# Patient Record
Sex: Female | Born: 1950 | Race: White | Hispanic: No | Marital: Single | Smoking: Former smoker
Health system: Southern US, Community
[De-identification: ages and names within clinical notes are randomized; demographics above are authoritative.]

## PROBLEM LIST (undated history)

## (undated) DIAGNOSIS — Z95810 Presence of automatic (implantable) cardiac defibrillator: Secondary | ICD-10-CM

## (undated) DIAGNOSIS — Z950 Presence of cardiac pacemaker: Secondary | ICD-10-CM

## (undated) DIAGNOSIS — I10 Essential (primary) hypertension: Secondary | ICD-10-CM

## (undated) DIAGNOSIS — R0600 Dyspnea, unspecified: Secondary | ICD-10-CM

## (undated) DIAGNOSIS — J449 Chronic obstructive pulmonary disease, unspecified: Secondary | ICD-10-CM

## (undated) DIAGNOSIS — I2510 Atherosclerotic heart disease of native coronary artery without angina pectoris: Secondary | ICD-10-CM

## (undated) DIAGNOSIS — I509 Heart failure, unspecified: Secondary | ICD-10-CM

## (undated) DIAGNOSIS — C189 Malignant neoplasm of colon, unspecified: Secondary | ICD-10-CM

## (undated) DIAGNOSIS — F329 Major depressive disorder, single episode, unspecified: Secondary | ICD-10-CM

## (undated) DIAGNOSIS — I739 Peripheral vascular disease, unspecified: Secondary | ICD-10-CM

## (undated) DIAGNOSIS — F32A Depression, unspecified: Secondary | ICD-10-CM

## (undated) DIAGNOSIS — K219 Gastro-esophageal reflux disease without esophagitis: Secondary | ICD-10-CM

## (undated) DIAGNOSIS — Z860100 Personal history of colon polyps, unspecified: Secondary | ICD-10-CM

## (undated) DIAGNOSIS — Z720 Tobacco use: Secondary | ICD-10-CM

## (undated) DIAGNOSIS — I059 Rheumatic mitral valve disease, unspecified: Secondary | ICD-10-CM

## (undated) DIAGNOSIS — D649 Anemia, unspecified: Secondary | ICD-10-CM

## (undated) DIAGNOSIS — N189 Chronic kidney disease, unspecified: Secondary | ICD-10-CM

## (undated) DIAGNOSIS — I219 Acute myocardial infarction, unspecified: Secondary | ICD-10-CM

## (undated) DIAGNOSIS — E785 Hyperlipidemia, unspecified: Secondary | ICD-10-CM

## (undated) HISTORY — PX: COLON SURGERY: SHX602

## (undated) HISTORY — DX: Rheumatic mitral valve disease, unspecified: I05.9

## (undated) HISTORY — PX: OTHER SURGICAL HISTORY: SHX169

## (undated) HISTORY — DX: Personal history of colon polyps, unspecified: Z86.0100

## (undated) HISTORY — DX: Depression, unspecified: F32.A

## (undated) HISTORY — DX: Malignant neoplasm of colon, unspecified: C18.9

## (undated) HISTORY — DX: Gastro-esophageal reflux disease without esophagitis: K21.9

## (undated) HISTORY — DX: Personal history of colonic polyps: Z86.010

## (undated) HISTORY — PX: CARDIAC CATHETERIZATION: SHX172

## (undated) HISTORY — PX: CORONARY ARTERY BYPASS GRAFT: SHX141

## (undated) HISTORY — DX: Essential (primary) hypertension: I10

## (undated) HISTORY — DX: Major depressive disorder, single episode, unspecified: F32.9

## (undated) HISTORY — DX: Hyperlipidemia, unspecified: E78.5

## (undated) HISTORY — DX: Atherosclerotic heart disease of native coronary artery without angina pectoris: I25.10

---
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
## 1997-11-06 HISTORY — PX: TOTAL ABDOMINAL HYSTERECTOMY: SHX209

## 2005-11-13 ENCOUNTER — Other Ambulatory Visit: Payer: Self-pay

## 2005-11-14 ENCOUNTER — Inpatient Hospital Stay: Payer: Self-pay

## 2006-01-02 ENCOUNTER — Encounter: Payer: Self-pay | Admitting: Nurse Practitioner

## 2006-01-04 ENCOUNTER — Encounter: Payer: Self-pay | Admitting: Nurse Practitioner

## 2007-04-25 ENCOUNTER — Ambulatory Visit: Payer: Self-pay | Admitting: Family Medicine

## 2008-07-08 ENCOUNTER — Ambulatory Visit: Payer: Self-pay | Admitting: Internal Medicine

## 2008-07-30 ENCOUNTER — Ambulatory Visit: Payer: Self-pay | Admitting: Internal Medicine

## 2009-03-10 ENCOUNTER — Ambulatory Visit: Payer: Self-pay | Admitting: Internal Medicine

## 2009-05-05 ENCOUNTER — Ambulatory Visit: Payer: Self-pay | Admitting: Unknown Physician Specialty

## 2009-05-07 ENCOUNTER — Ambulatory Visit: Payer: Self-pay | Admitting: Unknown Physician Specialty

## 2009-06-03 ENCOUNTER — Inpatient Hospital Stay: Payer: Self-pay | Admitting: Surgery

## 2009-06-29 ENCOUNTER — Ambulatory Visit: Payer: Self-pay | Admitting: Oncology

## 2009-07-07 ENCOUNTER — Ambulatory Visit: Payer: Self-pay | Admitting: Oncology

## 2009-11-06 ENCOUNTER — Emergency Department: Payer: Self-pay | Admitting: Internal Medicine

## 2010-05-12 ENCOUNTER — Inpatient Hospital Stay: Payer: Self-pay | Admitting: Internal Medicine

## 2010-09-04 ENCOUNTER — Inpatient Hospital Stay: Payer: Self-pay | Admitting: Specialist

## 2013-02-23 LAB — BASIC METABOLIC PANEL
Calcium, Total: 9.9 mg/dL (ref 8.5–10.1)
Chloride: 108 mmol/L — ABNORMAL HIGH (ref 98–107)
Co2: 21 mmol/L (ref 21–32)
EGFR (African American): >60
Sodium: 141 mmol/L (ref 136–145)

## 2013-02-23 LAB — TROPONIN I: Troponin-I: <0.02 ng/mL

## 2013-02-23 LAB — CBC
HCT: 44.3 % (ref 35.0–47.0)
HGB: 15.5 g/dL (ref 12.0–16.0)
MCH: 31.5 pg (ref 26.0–34.0)
MCHC: 35 g/dL (ref 32.0–36.0)
MCV: 90 fL (ref 80–100)
Platelet: 291 10*3/uL (ref 150–440)
RDW: 13.3 % (ref 11.5–14.5)

## 2013-02-24 ENCOUNTER — Observation Stay: Payer: Self-pay | Admitting: Internal Medicine

## 2013-02-24 LAB — DRUG SCREEN, URINE
Benzodiazepine, Ur Scrn: NEGATIVE (ref ?–200)
Cannabinoid 50 Ng, Ur ~~LOC~~: NEGATIVE (ref ?–50)
Cocaine Metabolite,Ur ~~LOC~~: NEGATIVE (ref ?–300)
MDMA (Ecstasy)Ur Screen: NEGATIVE (ref ?–500)
Methadone, Ur Screen: NEGATIVE (ref ?–300)
Opiate, Ur Screen: NEGATIVE (ref ?–300)

## 2013-02-24 LAB — CK TOTAL AND CKMB (NOT AT ARMC)
CK, Total: 145 U/L (ref 21–215)
CK-MB: 3.1 ng/mL (ref 0.5–3.6)
CK-MB: 3 ng/mL (ref 0.5–3.6)

## 2013-02-24 LAB — ETHANOL: Ethanol %: 0.097 % — ABNORMAL HIGH (ref 0.000–0.080)

## 2013-02-24 LAB — TROPONIN I: Troponin-I: <0.02 ng/mL

## 2013-02-24 LAB — TSH: Thyroid Stimulating Horm: 2.21 u[IU]/mL

## 2013-02-24 LAB — PHOSPHORUS: Phosphorus: 3.7 mg/dL (ref 2.5–4.9)

## 2013-02-25 LAB — BASIC METABOLIC PANEL
Anion Gap: 7 (ref 7–16)
BUN: 16 mg/dL (ref 7–18)
Chloride: 106 mmol/L (ref 98–107)
Co2: 26 mmol/L (ref 21–32)
Creatinine: 1 mg/dL (ref 0.60–1.30)
Glucose: 87 mg/dL (ref 65–99)
Osmolality: 278 (ref 275–301)
Sodium: 139 mmol/L (ref 136–145)

## 2013-02-25 LAB — MAGNESIUM: Magnesium: 2 mg/dL

## 2013-02-25 LAB — LIPID PANEL
HDL Cholesterol: 33 mg/dL — ABNORMAL LOW (ref 40–60)
Ldl Cholesterol, Calc: 111 mg/dL — ABNORMAL HIGH (ref 0–100)
Triglycerides: 321 mg/dL — ABNORMAL HIGH (ref 0–200)

## 2013-02-25 LAB — PHOSPHORUS: Phosphorus: 3.8 mg/dL (ref 2.5–4.9)

## 2013-05-29 ENCOUNTER — Observation Stay: Payer: Self-pay | Admitting: Cardiology

## 2013-05-29 ENCOUNTER — Ambulatory Visit (INDEPENDENT_AMBULATORY_CARE_PROVIDER_SITE_OTHER): Payer: Medicaid Other | Admitting: Internal Medicine

## 2013-05-29 ENCOUNTER — Encounter: Payer: Self-pay | Admitting: Internal Medicine

## 2013-05-29 VITALS — BP 142/100 | HR 86 | Temp 98.2°F | Ht 64.5 in | Wt 165.0 lb

## 2013-05-29 DIAGNOSIS — C189 Malignant neoplasm of colon, unspecified: Secondary | ICD-10-CM

## 2013-05-29 DIAGNOSIS — K219 Gastro-esophageal reflux disease without esophagitis: Secondary | ICD-10-CM

## 2013-05-29 DIAGNOSIS — E7800 Pure hypercholesterolemia, unspecified: Secondary | ICD-10-CM

## 2013-05-29 DIAGNOSIS — F329 Major depressive disorder, single episode, unspecified: Secondary | ICD-10-CM

## 2013-05-29 DIAGNOSIS — I2510 Atherosclerotic heart disease of native coronary artery without angina pectoris: Secondary | ICD-10-CM

## 2013-05-29 DIAGNOSIS — R079 Chest pain, unspecified: Secondary | ICD-10-CM

## 2013-05-29 DIAGNOSIS — I10 Essential (primary) hypertension: Secondary | ICD-10-CM

## 2013-05-29 LAB — BASIC METABOLIC PANEL
Anion Gap: 7 (ref 7–16)
Calcium, Total: 9.6 mg/dL (ref 8.5–10.1)
Chloride: 104 mmol/L (ref 98–107)
Co2: 27 mmol/L (ref 21–32)
Creatinine: 0.87 mg/dL (ref 0.60–1.30)
Glucose: 96 mg/dL (ref 65–99)
Potassium: 3.7 mmol/L (ref 3.5–5.1)

## 2013-05-29 LAB — CK TOTAL AND CKMB (NOT AT ARMC): CK-MB: 2.2 ng/mL (ref 0.5–3.6)

## 2013-05-29 LAB — TROPONIN I: Troponin-I: <0.02 ng/mL

## 2013-05-29 LAB — PROTIME-INR: INR: 1

## 2013-05-29 LAB — CBC
HCT: 43.3 % (ref 35.0–47.0)
MCHC: 35 g/dL (ref 32.0–36.0)
Platelet: 250 10*3/uL (ref 150–440)
RBC: 4.78 10*6/uL (ref 3.80–5.20)
WBC: 10 10*3/uL (ref 3.6–11.0)

## 2013-05-30 LAB — CBC WITH DIFFERENTIAL/PLATELET
Basophil %: 0.5 %
Eosinophil %: 2.4 %
HCT: 41 % (ref 35.0–47.0)
HGB: 14.3 g/dL (ref 12.0–16.0)
Lymphocyte %: 28.9 %
MCH: 31.7 pg (ref 26.0–34.0)
MCHC: 35 g/dL (ref 32.0–36.0)
MCV: 91 fL (ref 80–100)
Neutrophil #: 4.9 10*3/uL (ref 1.4–6.5)
Neutrophil %: 58.2 %
RBC: 4.53 10*6/uL (ref 3.80–5.20)
RDW: 13.9 % (ref 11.5–14.5)
WBC: 8.5 10*3/uL (ref 3.6–11.0)

## 2013-05-30 LAB — CK TOTAL AND CKMB (NOT AT ARMC)
CK, Total: 50 U/L (ref 21–215)
CK-MB: 1.4 ng/mL (ref 0.5–3.6)

## 2013-05-30 LAB — BASIC METABOLIC PANEL
Anion Gap: 5 — ABNORMAL LOW (ref 7–16)
Calcium, Total: 8.9 mg/dL (ref 8.5–10.1)
Chloride: 106 mmol/L (ref 98–107)
Creatinine: 1.06 mg/dL (ref 0.60–1.30)
EGFR (African American): >60
Glucose: 105 mg/dL — ABNORMAL HIGH (ref 65–99)
Osmolality: 282 (ref 275–301)
Potassium: 3.4 mmol/L — ABNORMAL LOW (ref 3.5–5.1)

## 2013-05-30 LAB — TROPONIN I
Troponin-I: <0.02 ng/mL
Troponin-I: <0.02 ng/mL

## 2013-05-30 LAB — LIPID PANEL: Ldl Cholesterol, Calc: 107 mg/dL — ABNORMAL HIGH (ref 0–100)

## 2013-06-01 ENCOUNTER — Encounter: Payer: Self-pay | Admitting: Internal Medicine

## 2013-06-01 DIAGNOSIS — F325 Major depressive disorder, single episode, in full remission: Secondary | ICD-10-CM | POA: Insufficient documentation

## 2013-06-01 DIAGNOSIS — I10 Essential (primary) hypertension: Secondary | ICD-10-CM | POA: Insufficient documentation

## 2013-06-01 DIAGNOSIS — D126 Benign neoplasm of colon, unspecified: Secondary | ICD-10-CM | POA: Insufficient documentation

## 2013-06-01 DIAGNOSIS — K219 Gastro-esophageal reflux disease without esophagitis: Secondary | ICD-10-CM | POA: Insufficient documentation

## 2013-06-01 DIAGNOSIS — E7800 Pure hypercholesterolemia, unspecified: Secondary | ICD-10-CM | POA: Insufficient documentation

## 2013-06-01 DIAGNOSIS — I2510 Atherosclerotic heart disease of native coronary artery without angina pectoris: Secondary | ICD-10-CM | POA: Insufficient documentation

## 2013-06-01 NOTE — Assessment & Plan Note (Signed)
Blood pressure significant elevated.  Off her medication.  Will need to restart.  Referred to ER as outlined.  Will follow up after discharge.  Blood pressure after NTG - 204/118.

## 2013-06-01 NOTE — Assessment & Plan Note (Signed)
Low cholesterol diet.  Will need to restart her statin.

## 2013-06-01 NOTE — Assessment & Plan Note (Signed)
Has known reflux.  Off omeprazole.  Will need to restart omeprazole.  If persistent symptoms, will require GI evaluation.

## 2013-06-01 NOTE — Assessment & Plan Note (Signed)
Off her lexapro.  Will need to restart.  Follow.

## 2013-06-01 NOTE — Assessment & Plan Note (Signed)
Is s/p CABG and mitral valve repair.  Now with intermittent chest pain.  Off all medication.  Blood pressure significantly elevated.  Was given a NTG here in the office and her pain went from a 6 down to 1-2 prior to EMS arrival.  O2 placed.  EKG obtained and revealed SR with ST depression in I and aVL and v5 and v6.  EMTs called and pt was transported over to the ER for further evaluation and treatment.   ER notified.

## 2013-06-01 NOTE — Progress Notes (Signed)
Subjective:    Patient ID: Ashley [name], female    DOB: December 10, 1950, 62 y.o.   MRN: [id number]  HPI 62 year old female with past history of CAD s/p CABG and mitral valve repair, colon cancer s/p colon surgery, hypertension and hypercholesterolemia who comes in today to follow up on these issues as well as to establish care.  States she was hospitalized at [facility] with high blood pressure and sob.  Told she had [place].  Saw Dr Saralyn Pilar after discharge.  Was supposed to have had a stress test.  She did not follow through with having the test.  She has intermittent chest pain.  Takes NTG prn.  Has been off all her medications for the last few weeks.  Does not have NTG now to take.  Started having chest pain last pm.  Still present today.  She also has a history of GERD.  Off omeprazole now for a few weeks.  Having increased issues with reflux.  Has different chest pains.  Some of her pain is reproducible on exam.  She also has some discomfort she attributes the the reflux, and then has the angina pain.  She also describes her left arm aching.  She does smoke.  No desire to quit.  No nausea or vomiting.  Bowels stable for her.  Has had diarrhea since her colon surgery.  Due a f/u colonoscopy.  Sees Dr Tiffany Kocher.     Past Medical History  Diagnosis Date  . GERD (gastroesophageal reflux disease)   . Hypertension   . Hyperlipidemia   . Depression   . Hx of colonic polyps   . Colon cancer   . CAD (coronary artery disease)     s/p CABG  . Mitral valve disorder     s/p mitral valve repair wth CABG    Outpatient Encounter Prescriptions as of 05/29/2013  Medication Sig Dispense Refill  . Aclidinium Bromide (TUDORZA PRESSAIR) 400 MCG/ACT AEPB Inhale into the lungs.      [name] albuterol (PROVENTIL HFA;VENTOLIN HFA) 108 (90 BASE) MCG/ACT inhaler Inhale 2 puffs into the lungs every 6 (six) hours as needed for wheezing.      [name] aspirin 81 MG tablet Take 81 mg by mouth daily.      . carvedilol (COREG) 6.25 MG tablet  Take 6.25 mg by mouth 2 (two) times daily with a meal.      . escitalopram (LEXAPRO) 10 MG tablet Take 10 mg by mouth daily.      . Fluticasone-Salmeterol (ADVAIR DISKUS) 250-50 MCG/DOSE AEPB Inhale 1 puff into the lungs every 12 (twelve) hours.      [name] lisinopril (PRINIVIL,ZESTRIL) 20 MG tablet Take 20 mg by mouth daily.      . metroNIDAZOLE (METROCREAM) 0.75 % cream Apply 1 application topically 2 (two) times daily as needed.      . nitroGLYCERIN (NITROSTAT) 0.4 MG SL tablet Place 0.4 mg under the tongue every 5 (five) minutes as needed for chest pain.      [name] omeprazole (PRILOSEC) 20 MG capsule Take 20 mg by mouth 2 (two) times daily.      . simvastatin (ZOCOR) 20 MG tablet Take 20 mg by mouth every evening.       No facility-administered encounter medications on file as of 05/29/2013.    Review of Systems Patient denies any headache, lightheadedness or dizziness.  No significant sinus or allergy symptoms.   Reports the chest pain as outlined.  Left arm aching.  Increased acid reflux.  No nausea or vomiting.  No abdominal pain or cramping.  No bowel change, such as constipation, BRBPR or melana.  Does report diarrhea since her surgery.  No urine change.  Has right hip and buttock pain - with walking.  Some bilateral toe numbness.  Out of her medication as outlined.        Objective:   Physical Exam Filed Vitals:   05/29/13 1334  BP: 142/100  Pulse: 86  Temp: 98.2 F (36.8 C)   Blood pressure recheck:  220/122, pulse 67  62 year old female in no acute distress.   HEENT:  Nares- clear.  Oropharynx - without lesions. NECK:  Supple.  Nontender.  No audible bruit.  HEART:  Appears to be regular. LUNGS:  No crackles or wheezing audible.  Respirations even and unlabored.  RADIAL PULSE:  Equal bilaterally.    CHEST:  Some reproducible pain with palpation over her anterior chest.  Not the same pain as she is describing as her angina pain.  ABDOMEN:  Soft, nontender.  Bowel sounds present  and normal.  No audible abdominal bruit.   EXTREMITIES:  No increased edema present.  DP pulses palpable and equal bilaterally.           Assessment & Plan:  MSK.  Various joint and msk complaints.  Will pursue further cardiac w/up first and then follow up regarding the msk pain.    HEALTH MAINTENANCE.  Will need to get her in for a complete physical exam.  Discuss further health maintenance issues with her at that time.    I spent one hour with the patient and more than 50% of the time was spent in consultation regarding the above.

## 2013-06-01 NOTE — Assessment & Plan Note (Signed)
Last colonoscopy 2009.  Overdue.  Will need to get her current cardiac issues sorted through and then pursue further GI evaluation.  Has seen Dr Tiffany Kocher.

## 2013-06-04 ENCOUNTER — Telehealth: Payer: Self-pay | Admitting: Internal Medicine

## 2013-06-10 ENCOUNTER — Telehealth: Payer: Self-pay | Admitting: *Deleted

## 2013-06-10 NOTE — Telephone Encounter (Signed)
Patient has invalid number tried to call and schedule hospital follow up. Was able to reach patient through [facility]. Hospital follow up scheduled.

## 2013-06-18 ENCOUNTER — Telehealth: Payer: Self-pay | Admitting: *Deleted

## 2013-06-18 ENCOUNTER — Ambulatory Visit (INDEPENDENT_AMBULATORY_CARE_PROVIDER_SITE_OTHER): Payer: Medicare Other | Admitting: Internal Medicine

## 2013-06-18 ENCOUNTER — Ambulatory Visit: Payer: Self-pay | Admitting: Internal Medicine

## 2013-06-18 ENCOUNTER — Encounter: Payer: Self-pay | Admitting: Internal Medicine

## 2013-06-18 VITALS — BP 172/92 | HR 60 | Temp 97.7°F | Resp 12 | Wt 166.8 lb

## 2013-06-18 DIAGNOSIS — J441 Chronic obstructive pulmonary disease with (acute) exacerbation: Secondary | ICD-10-CM | POA: Insufficient documentation

## 2013-06-18 DIAGNOSIS — R0989 Other specified symptoms and signs involving the circulatory and respiratory systems: Secondary | ICD-10-CM | POA: Diagnosis not present

## 2013-06-18 DIAGNOSIS — Z85038 Personal history of other malignant neoplasm of large intestine: Secondary | ICD-10-CM

## 2013-06-18 DIAGNOSIS — J4489 Other specified chronic obstructive pulmonary disease: Secondary | ICD-10-CM

## 2013-06-18 DIAGNOSIS — E7800 Pure hypercholesterolemia, unspecified: Secondary | ICD-10-CM

## 2013-06-18 DIAGNOSIS — Z0000 Encounter for general adult medical examination without abnormal findings: Secondary | ICD-10-CM

## 2013-06-18 DIAGNOSIS — I70219 Atherosclerosis of native arteries of extremities with intermittent claudication, unspecified extremity: Secondary | ICD-10-CM | POA: Diagnosis not present

## 2013-06-18 DIAGNOSIS — J449 Chronic obstructive pulmonary disease, unspecified: Secondary | ICD-10-CM

## 2013-06-18 DIAGNOSIS — C189 Malignant neoplasm of colon, unspecified: Secondary | ICD-10-CM

## 2013-06-18 DIAGNOSIS — Z1239 Encounter for other screening for malignant neoplasm of breast: Secondary | ICD-10-CM | POA: Diagnosis not present

## 2013-06-18 DIAGNOSIS — Z01818 Encounter for other preprocedural examination: Secondary | ICD-10-CM | POA: Insufficient documentation

## 2013-06-18 DIAGNOSIS — R0789 Other chest pain: Secondary | ICD-10-CM

## 2013-06-18 DIAGNOSIS — I2510 Atherosclerotic heart disease of native coronary artery without angina pectoris: Secondary | ICD-10-CM

## 2013-06-18 DIAGNOSIS — R079 Chest pain, unspecified: Secondary | ICD-10-CM

## 2013-06-18 DIAGNOSIS — I10 Essential (primary) hypertension: Secondary | ICD-10-CM

## 2013-06-18 DIAGNOSIS — R071 Chest pain on breathing: Secondary | ICD-10-CM

## 2013-06-18 DIAGNOSIS — R0781 Pleurodynia: Secondary | ICD-10-CM

## 2013-06-18 MED ORDER — OXYCODONE-ACETAMINOPHEN 5-325 MG PO TABS: 1.0000 | ORAL_TABLET | Freq: Three times a day (TID) | ORAL | Status: DC | PRN

## 2013-06-18 NOTE — Assessment & Plan Note (Signed)
Secondary to years of tobacco abuse. Tobacco cessation advised given her history of coronary artery disease hyperlipidemia hypertension and probable peripheral vascular disease. To change her meclizine bromide to Spiriva as she is not tolerating this inhaler. Continue Advair.

## 2013-06-18 NOTE — Progress Notes (Signed)
Patient ID: Ashley [name], female   DOB: 12/21/1950, 62 y.o.   MRN: [id number]  Patient Active Problem List   Diagnosis Date Noted  . COPD (chronic obstructive pulmonary disease) 06/18/2013  . Atherosclerosis of native arteries of the extremities with intermittent claudication 06/18/2013  . Routine general medical examination at a health care facility 06/18/2013  . CAD (coronary artery disease) 06/01/2013  . GERD (gastroesophageal reflux disease) 06/01/2013  . Essential hypertension, benign 06/01/2013  . Hypercholesterolemia 06/01/2013  . Colon cancer 06/01/2013  . Depression 06/01/2013    Subjective:  CC:   Chief Complaint  Patient presents with  . Follow-up    hospital follow up    HPI:   Ashley [name] is a 62 y.o. female who presents as a new patient to establish primary care with the chief complaint of  Buttock pain. Last seen by me over 3 years ago      Hospitalized 7/24 for [place], known CAD s/p CABG,  Had run out of meds.  Negative stress test , negative troponins. Discharged home on statin asa, coreg and lisinopril  Had a fall while dizzy brushing hair  Bent over ,  Stood up got dizzy and fell against the tub.,  Left sided ribs still hurting .  Hurst to take a deep breath, sneeze or cough.  Still smoking 1/2 pack daily   Lost her job at [employer] after having colon Ca due to recurrent missed work for diarrhea (hemicolectomy) waiting for disability.  Moved to [place] for 2 yrs,  Returned to [facility] 2013.[name]    Uncontrolled HTN: secondary to noncompliance due to financial pressures   Stress and urge Urinary incontinence.  1999 TAH/BSO secondary to severe cramps and irregular PAP smears  For 1.5 yrs.no trial of anticholinergics  COPD:  Still smoking  Doesn't like ne of her inhalers bc it is difficult to manage   SH:  TAH/BSO,  Hemicolectomy,  Left arm plate and screws  Following fracture.,orf radius and ulna   Last mammogram:  Needs no prior abnormals Last colonoscopy  was after her hemicolectomy 2009 at [facility])  Needs appt.    DEXA scan done remotely 2007  (she thinks)     Past Medical History  Diagnosis Date  . GERD (gastroesophageal reflux disease)   . Hypertension   . Hyperlipidemia   . Depression   . Hx of colonic polyps   . Colon cancer   . CAD (coronary artery disease)     s/p CABG  . Mitral valve disorder     s/p mitral valve repair wth CABG    Past Surgical History  Procedure Laterality Date  . Total abdominal hysterectomy  1999    history of abnormal pap  . Cabg with mitral valve repair    . Colon surgery      colon cancer  . Arm surgery      fracture, has plates and screws    Family History  Problem Relation Age of Onset  . Arthritis Mother   . Cancer Mother     uterus cancer  . Hyperlipidemia Mother   . Hypertension Mother   . Heart disease Mother   . Diabetes Mother   . Hyperlipidemia Father   . Hypertension Father   . Heart disease Father   . Diabetes Father   . Cancer Sister     ovary cancer  . Diabetes Maternal Grandmother   . Hypertension Maternal Grandmother   . Arthritis Maternal Grandmother   .  Hypertension Maternal Grandfather   . Hypertension Paternal Grandmother   . Hypertension Paternal Grandfather   . Heart disease Paternal Grandfather     History   Social History  . Marital Status: Widowed    Spouse Name: N/A    Number of Children: 0  . Years of Education: N/A   Occupational History  . Not on file.   Social History Main Topics  . Smoking status: Current Every Day Smoker    Types: Cigarettes  . Smokeless tobacco: Never Used     Comment: 1/2-1 ppd  . Alcohol Use: No  . Drug Use: No  . Sexual Activity: Not on file   Other Topics Concern  . Not on file   Social History Narrative  . No narrative on file    No Known Allergies   Review of Systems:   The remainder of the review of systems was negative except those addressed in the HPI.    Objective:  BP 172/92   Pulse 60  Temp(Src) 97.7 F (36.5 C) (Oral)  Resp 12  Wt 166 lb 12 oz (75.637 kg)  BMI 28.19 kg/m2  SpO2 99%  General appearance: alert, cooperative and appears older than stated age.  Several teeth missing.  Ears: normal TM's and external ear canals both ears Throat: lips, mucosa, and tongue normal; teeth and gums normal Neck: no adenopathy, no carotid bruit, supple, symmetrical, trachea midline and thyroid not enlarged, symmetric, no tenderness/mass/nodules Back: symmetric, no curvature. ROM normal. No CVA tenderness. Lungs: clear to auscultation bilaterally Heart: regular rate and rhythm, S1, S2 normal, no murmur, click, rub or gallop Abdomen: soft, non-tender; bowel sounds normal; no masses,  no organomegaly Pulses: nonpalpable right DP,  Left 1+ cap refill sluggish Skin: Skin color, texture, turgor normal. No rashes or lesions Lymph nodes: Cervical, supraclavicular, and axillary nodes normal.  Assessment and Plan:  COPD (chronic obstructive pulmonary disease) Secondary to years of tobacco abuse. Tobacco cessation advised given her history of coronary artery disease hyperlipidemia hypertension and probable peripheral vascular disease. To change her meclizine bromide to Spiriva as she is not tolerating this inhaler. Continue Advair.  Essential hypertension, benign Uncontrolled on current regimen. Increasing lisinopril to 20 mg twice daily. She will recheck her blood pressure in one week and if still elevated above 150 we will start losartan instead of lisinopril. Continue carvedilol  Colon cancer Details of colon cancer are not clear as I do not have old records. I do note that she has been lost to followup and has not had a colonoscopy since her hemicolectomy 4 years ago. Referral to Dr. Vira Agar for followup.  Hypercholesterolemia She is tolerating simvastatin with no muscle aches. She has been on the medication since discharge one week ago. She will return in early September for  repeat lipids.  CAD (coronary artery disease) Her coronary artery disease is managed by Dr. Neldon Newport shows. He is planning on doing cardiac catheterization on her in the near future.  Atherosclerosis of native arteries of the extremities with intermittent claudication She has an absent pulse in the right foot and upon questioning has recurrent buttock and thigh pain with walking but it causes her to stop. Range of motion exercises of the hip were normal. In concerned she has a discussed critical stenosis here that needs to be found prior to her cardiac catheterization. Referral to [facility]. for evaluation.  Routine general medical examination at a health care facility Breast and pelvic exams were not done today but she was  brought up to date  with all overdue screenings. Mammogram has been ordered.  Left-sided chest wall pain And she is exquisitely tender on the left s .ide after having blunt trauma to left sided rib cage he a week ago. Plain films have been ordered to rule out fractures. She is a high-risk for a pneumonia given her history of COPD, ongoing tobacco abuse, and pain with inspiration and cough. I've given her prescription for Percocet advised to use this along with ibuprofen for it and anti-inflammatory   Updated Medication List Outpatient Encounter Prescriptions as of 06/18/2013  Medication Sig Dispense Refill  . Aclidinium Bromide (TUDORZA PRESSAIR) 400 MCG/ACT AEPB Inhale into the lungs.      [name] albuterol (PROVENTIL HFA;VENTOLIN HFA) 108 (90 BASE) MCG/ACT inhaler Inhale 2 puffs into the lungs every 6 (six) hours as needed for wheezing.      [name] aspirin 81 MG tablet Take 81 mg by mouth daily.      . carvedilol (COREG) 6.25 MG tablet Take 6.25 mg by mouth 2 (two) times daily with a meal.      . escitalopram (LEXAPRO) 10 MG tablet Take 10 mg by mouth daily.      . Fluticasone-Salmeterol (ADVAIR DISKUS) 250-50 MCG/DOSE AEPB Inhale 1 puff into the lungs every 12 (twelve) hours.      [name]  lisinopril (PRINIVIL,ZESTRIL) 20 MG tablet Take 20 mg by mouth daily.      . metroNIDAZOLE (METROCREAM) 0.75 % cream Apply 1 application topically 2 (two) times daily as needed.      . nitroGLYCERIN (NITROSTAT) 0.4 MG SL tablet Place 0.4 mg under the tongue every 5 (five) minutes as needed for chest pain.      [name] omeprazole (PRILOSEC) 20 MG capsule Take 20 mg by mouth 2 (two) times daily.      . simvastatin (ZOCOR) 20 MG tablet Take 20 mg by mouth every evening.      [name] oxyCODONE-acetaminophen (ROXICET) 5-325 MG per tablet Take 1 tablet by mouth every 8 (eight) hours as needed for pain.  30 tablet  0   No facility-administered encounter medications on file as of 06/18/2013.

## 2013-06-18 NOTE — Patient Instructions (Addendum)
1) Your blood pressure is not at goal yet. Increase to  Lisinopril to twice daily   20 mg Check bp once daily around noon.  If bp is still > 150/90,   Call us and we will substitute losartan once daily for the lisinopril Continue coreg at current dose.   2) your cholesterol needs follow up We need to repeat fasting lipids after sept 7 th   (nothing but water for 6 to 8 hours prior to blood draw)   3) I am giving you Percocet for rib pain (short supply),  Ok to add aleve or motrin  To it    Rib films to rule out fracture. [facility]  4) COPD:  Pleas decrease your cigarette use by 1 cigarette  Per day every week We Can substitute once daily Spiriva for the Tudorza  5) Buttock pain: this may be a sign of a blockage in your artery   Referral to [facility] for evaluation of your circulation  Mammogran and colonoscopy ordered too

## 2013-06-18 NOTE — Assessment & Plan Note (Signed)
Uncontrolled on current regimen. Increasing lisinopril to 20 mg twice daily. She will recheck her blood pressure in one week and if still elevated above 150 we will start losartan instead of lisinopril. Continue carvedilol

## 2013-06-18 NOTE — Assessment & Plan Note (Signed)
Breast and pelvic exams were not done today but she was brought up to date  with all overdue screenings. Mammogram has been ordered.

## 2013-06-18 NOTE — Assessment & Plan Note (Signed)
She has an absent pulse in the right foot and upon questioning has recurrent buttock and thigh pain with walking but it causes her to stop. Range of motion exercises of the hip were normal. In concerned she has a discussed critical stenosis here that needs to be found prior to her cardiac catheterization. Referral to [facility]. for evaluation.

## 2013-06-18 NOTE — Telephone Encounter (Signed)
Called and talked with patient concerning fractured Ribs as verbally ordered and instructed patient on Deep breathing patient voiced understanding. FYI

## 2013-06-18 NOTE — Assessment & Plan Note (Signed)
Details of colon cancer are not clear as I do not have old records. I do note that she has been lost to followup and has not had a colonoscopy since her hemicolectomy 4 years ago. Referral to Dr. Vira Agar for followup.

## 2013-06-18 NOTE — Assessment & Plan Note (Signed)
She is tolerating simvastatin with no muscle aches. She has been on the medication since discharge one week ago. She will return in early September for repeat lipids.

## 2013-06-18 NOTE — Assessment & Plan Note (Signed)
Her coronary artery disease is managed by Dr. Neldon Newport shows. He is planning on doing cardiac catheterization on her in the near future.

## 2013-06-18 NOTE — Assessment & Plan Note (Signed)
And she is exquisitely tender on the left s .ide after having blunt trauma to left sided rib cage he a week ago. Plain films have been ordered to rule out fractures. She is a high-risk for a pneumonia given her history of COPD, ongoing tobacco abuse, and pain with inspiration and cough. I've given her prescription for Percocet advised to use this along with ibuprofen for it and anti-inflammatory

## 2013-06-27 ENCOUNTER — Other Ambulatory Visit: Payer: Self-pay | Admitting: Internal Medicine

## 2013-06-27 ENCOUNTER — Telehealth: Payer: Self-pay | Admitting: *Deleted

## 2013-06-27 MED ORDER — PROMETHAZINE HCL 25 MG PO TABS: 25.0000 mg | ORAL_TABLET | Freq: Three times a day (TID) | ORAL | Status: DC | PRN

## 2013-06-27 MED ORDER — HYDROCODONE-ACETAMINOPHEN 10-325 MG PO TABS: 1.0000 | ORAL_TABLET | Freq: Three times a day (TID) | ORAL | Status: DC | PRN

## 2013-06-27 NOTE — Telephone Encounter (Signed)
Pt stated she only has enough meds for tonight  Please advise

## 2013-06-27 NOTE — Telephone Encounter (Signed)
The patient is wanting a prescription for Oxycodone for her fractured ribs.

## 2013-06-27 NOTE — Telephone Encounter (Signed)
Patient called back after notifying patient script called in for pain, with complaint  that Norco causes nausea and she would need nausea medication to continue with hydrocodone.

## 2013-06-27 NOTE — Telephone Encounter (Signed)
Phenergan ex sent to pharmacy

## 2013-06-27 NOTE — Telephone Encounter (Signed)
Phenergan rx sent to pharmacy

## 2013-06-27 NOTE — Telephone Encounter (Signed)
Script faxed for hydrocodone and patient notified.

## 2013-06-27 NOTE — Telephone Encounter (Signed)
No refills on oxycodone.,  10 days out she should not need that strong of a narcoti c anymore,  Hydrocodone 10/325,  #90 can call to pharmacy  If she is in that much pain something else is wronf and she needs to go to ER

## 2013-07-25 ENCOUNTER — Encounter: Payer: Self-pay | Admitting: Emergency Medicine

## 2013-09-02 ENCOUNTER — Encounter: Payer: Self-pay | Admitting: Adult Health

## 2013-09-02 ENCOUNTER — Ambulatory Visit (INDEPENDENT_AMBULATORY_CARE_PROVIDER_SITE_OTHER): Payer: Medicare Other | Admitting: Adult Health

## 2013-09-02 VITALS — BP 148/86 | HR 78 | Temp 97.8°F | Resp 12 | Wt 167.5 lb

## 2013-09-02 DIAGNOSIS — L255 Unspecified contact dermatitis due to plants, except food: Secondary | ICD-10-CM | POA: Diagnosis not present

## 2013-09-02 DIAGNOSIS — L237 Allergic contact dermatitis due to plants, except food: Secondary | ICD-10-CM

## 2013-09-02 MED ORDER — PREDNISONE 10 MG PO TABS: ORAL_TABLET | ORAL | Status: DC

## 2013-09-02 MED ORDER — DIPHENHYDRAMINE HCL 25 MG PO CAPS: 25.0000 mg | ORAL_CAPSULE | Freq: Four times a day (QID) | ORAL | Status: DC | PRN

## 2013-09-02 NOTE — Assessment & Plan Note (Signed)
Start prednisone taper 60 mg decreasing by 10 mg daily until done. It may take Benadryl 25 mg every 6 hours as needed for itching

## 2013-09-02 NOTE — Patient Instructions (Signed)
  Prednisone taper starting with 6 tablets and decrease by 1 tablet daily until done.  Take benadryl 25 mg every 6 hours as needed.

## 2013-09-02 NOTE — Progress Notes (Signed)
  Subjective:    Patient ID: Ashley [name], female    DOB: Aug 20, 1951, 62 y.o.   MRN: [id number]  HPI  Pt is a pleasant 62 yo female, states she picked up a branch in her yard and later noticed there was poison oak growing underneath the branches. Pt with red, vesicular rash to bilateral lower arms, left side of face, and behind left ear. Pt has been applying calamine lotion without relief. Pt has not taken any Benadryl or used any other OTC meds.     Current Outpatient Prescriptions on File Prior to Visit  Medication Sig Dispense Refill  . Aclidinium Bromide (TUDORZA PRESSAIR) 400 MCG/ACT AEPB Inhale into the lungs.      [name] albuterol (PROVENTIL HFA;VENTOLIN HFA) 108 (90 BASE) MCG/ACT inhaler Inhale 2 puffs into the lungs every 6 (six) hours as needed for wheezing.      [name] aspirin 81 MG tablet Take 81 mg by mouth daily.      . carvedilol (COREG) 6.25 MG tablet Take 6.25 mg by mouth 2 (two) times daily with a meal.      . escitalopram (LEXAPRO) 10 MG tablet Take 10 mg by mouth daily.      . Fluticasone-Salmeterol (ADVAIR DISKUS) 250-50 MCG/DOSE AEPB Inhale 1 puff into the lungs every 12 (twelve) hours.      [name] lisinopril (PRINIVIL,ZESTRIL) 20 MG tablet Take 20 mg by mouth daily.      . nitroGLYCERIN (NITROSTAT) 0.4 MG SL tablet Place 0.4 mg under the tongue every 5 (five) minutes as needed for chest pain.      [name] omeprazole (PRILOSEC) 20 MG capsule Take 20 mg by mouth 2 (two) times daily.      . simvastatin (ZOCOR) 20 MG tablet Take 20 mg by mouth every evening.      . metroNIDAZOLE (METROCREAM) 0.75 % cream Apply 1 application topically 2 (two) times daily as needed.      . promethazine (PHENERGAN) 25 MG tablet Take 1 tablet (25 mg total) by mouth every 8 (eight) hours as needed for nausea.  60 tablet  1   No current facility-administered medications on file prior to visit.     Review of Systems  Constitutional: Negative for fever and chills.  Respiratory: Negative for shortness of breath  and wheezing.   Skin: Positive for rash.       erythema and vesicular rash to bilateral lower arms and left side of face    Past Medical History  Diagnosis Date  . GERD (gastroesophageal reflux disease)   . Hypertension   . Hyperlipidemia   . Depression   . Hx of colonic polyps   . Colon cancer   . CAD (coronary artery disease)     s/p CABG  . Mitral valve disorder     s/p mitral valve repair wth CABG       Objective:   Physical Exam  Constitutional: She is oriented to person, place, and time. She appears well-developed and well-nourished. No distress.  Neurological: She is alert and oriented to person, place, and time.  Skin: Rash noted. Rash is vesicular. There is erythema.     Psychiatric: She has a normal mood and affect. Her behavior is normal. Thought content normal.    BP 148/86  Pulse 78  Temp(Src) 97.8 F (36.6 C) (Oral)  Resp 12  Wt 167 lb 8 oz (75.978 kg)  BMI 28.32 kg/m2  SpO2 97%      Assessment & Plan:

## 2014-02-20 ENCOUNTER — Emergency Department: Payer: Self-pay | Admitting: Emergency Medicine

## 2014-02-20 DIAGNOSIS — I10 Essential (primary) hypertension: Secondary | ICD-10-CM | POA: Diagnosis not present

## 2014-02-20 DIAGNOSIS — S42023A Displaced fracture of shaft of unspecified clavicle, initial encounter for closed fracture: Secondary | ICD-10-CM | POA: Diagnosis not present

## 2014-02-21 DIAGNOSIS — S42023A Displaced fracture of shaft of unspecified clavicle, initial encounter for closed fracture: Secondary | ICD-10-CM | POA: Diagnosis not present

## 2014-02-25 DIAGNOSIS — S42023A Displaced fracture of shaft of unspecified clavicle, initial encounter for closed fracture: Secondary | ICD-10-CM | POA: Diagnosis not present

## 2014-03-05 DIAGNOSIS — I519 Heart disease, unspecified: Secondary | ICD-10-CM | POA: Diagnosis not present

## 2014-03-05 DIAGNOSIS — I2510 Atherosclerotic heart disease of native coronary artery without angina pectoris: Secondary | ICD-10-CM | POA: Diagnosis not present

## 2014-03-05 DIAGNOSIS — I5022 Chronic systolic (congestive) heart failure: Secondary | ICD-10-CM | POA: Diagnosis not present

## 2014-03-05 DIAGNOSIS — E782 Mixed hyperlipidemia: Secondary | ICD-10-CM | POA: Diagnosis not present

## 2014-03-12 ENCOUNTER — Ambulatory Visit: Payer: Self-pay | Admitting: Orthopedic Surgery

## 2014-03-12 DIAGNOSIS — Z01810 Encounter for preprocedural cardiovascular examination: Secondary | ICD-10-CM | POA: Diagnosis not present

## 2014-03-12 DIAGNOSIS — I10 Essential (primary) hypertension: Secondary | ICD-10-CM | POA: Diagnosis not present

## 2014-03-12 DIAGNOSIS — S42009A Fracture of unspecified part of unspecified clavicle, initial encounter for closed fracture: Secondary | ICD-10-CM | POA: Diagnosis not present

## 2014-03-17 ENCOUNTER — Ambulatory Visit: Payer: Self-pay | Admitting: Orthopedic Surgery

## 2014-03-17 DIAGNOSIS — I10 Essential (primary) hypertension: Secondary | ICD-10-CM | POA: Diagnosis not present

## 2014-03-17 DIAGNOSIS — E785 Hyperlipidemia, unspecified: Secondary | ICD-10-CM | POA: Diagnosis not present

## 2014-03-17 DIAGNOSIS — I2510 Atherosclerotic heart disease of native coronary artery without angina pectoris: Secondary | ICD-10-CM | POA: Diagnosis not present

## 2014-03-17 DIAGNOSIS — Z8249 Family history of ischemic heart disease and other diseases of the circulatory system: Secondary | ICD-10-CM | POA: Diagnosis not present

## 2014-03-17 DIAGNOSIS — I209 Angina pectoris, unspecified: Secondary | ICD-10-CM | POA: Diagnosis not present

## 2014-03-17 DIAGNOSIS — Z7982 Long term (current) use of aspirin: Secondary | ICD-10-CM | POA: Diagnosis not present

## 2014-03-17 DIAGNOSIS — K3189 Other diseases of stomach and duodenum: Secondary | ICD-10-CM | POA: Diagnosis not present

## 2014-03-17 DIAGNOSIS — R42 Dizziness and giddiness: Secondary | ICD-10-CM | POA: Diagnosis not present

## 2014-03-17 DIAGNOSIS — Z79899 Other long term (current) drug therapy: Secondary | ICD-10-CM | POA: Diagnosis not present

## 2014-03-17 DIAGNOSIS — K219 Gastro-esophageal reflux disease without esophagitis: Secondary | ICD-10-CM | POA: Diagnosis not present

## 2014-03-17 DIAGNOSIS — M25559 Pain in unspecified hip: Secondary | ICD-10-CM | POA: Diagnosis not present

## 2014-03-17 DIAGNOSIS — Z85038 Personal history of other malignant neoplasm of large intestine: Secondary | ICD-10-CM | POA: Diagnosis not present

## 2014-03-17 DIAGNOSIS — Z833 Family history of diabetes mellitus: Secondary | ICD-10-CM | POA: Diagnosis not present

## 2014-03-17 DIAGNOSIS — F17200 Nicotine dependence, unspecified, uncomplicated: Secondary | ICD-10-CM | POA: Diagnosis not present

## 2014-03-17 DIAGNOSIS — J449 Chronic obstructive pulmonary disease, unspecified: Secondary | ICD-10-CM | POA: Diagnosis not present

## 2014-03-17 DIAGNOSIS — S42023A Displaced fracture of shaft of unspecified clavicle, initial encounter for closed fracture: Secondary | ICD-10-CM | POA: Diagnosis not present

## 2014-03-17 DIAGNOSIS — IMO0002 Reserved for concepts with insufficient information to code with codable children: Secondary | ICD-10-CM | POA: Diagnosis not present

## 2014-03-17 DIAGNOSIS — S42009A Fracture of unspecified part of unspecified clavicle, initial encounter for closed fracture: Secondary | ICD-10-CM | POA: Diagnosis not present

## 2014-03-17 DIAGNOSIS — Z951 Presence of aortocoronary bypass graft: Secondary | ICD-10-CM | POA: Diagnosis not present

## 2014-04-01 DIAGNOSIS — IMO0001 Reserved for inherently not codable concepts without codable children: Secondary | ICD-10-CM | POA: Diagnosis not present

## 2014-04-29 DIAGNOSIS — Z9889 Other specified postprocedural states: Secondary | ICD-10-CM | POA: Diagnosis not present

## 2014-04-29 DIAGNOSIS — IMO0001 Reserved for inherently not codable concepts without codable children: Secondary | ICD-10-CM | POA: Diagnosis not present

## 2014-04-29 DIAGNOSIS — Z8781 Personal history of (healed) traumatic fracture: Secondary | ICD-10-CM | POA: Diagnosis not present

## 2014-05-10 ENCOUNTER — Telehealth: Payer: Self-pay | Admitting: Internal Medicine

## 2014-05-10 DIAGNOSIS — S42023A Displaced fracture of shaft of unspecified clavicle, initial encounter for closed fracture: Secondary | ICD-10-CM | POA: Insufficient documentation

## 2014-05-10 DIAGNOSIS — S42022G Displaced fracture of shaft of left clavicle, subsequent encounter for fracture with delayed healing: Secondary | ICD-10-CM

## 2014-06-10 DIAGNOSIS — S42023A Displaced fracture of shaft of unspecified clavicle, initial encounter for closed fracture: Secondary | ICD-10-CM | POA: Diagnosis not present

## 2014-07-03 DIAGNOSIS — I219 Acute myocardial infarction, unspecified: Secondary | ICD-10-CM | POA: Insufficient documentation

## 2014-07-03 DIAGNOSIS — Z951 Presence of aortocoronary bypass graft: Secondary | ICD-10-CM | POA: Insufficient documentation

## 2014-08-18 DIAGNOSIS — R0602 Shortness of breath: Secondary | ICD-10-CM | POA: Diagnosis not present

## 2014-08-18 DIAGNOSIS — I10 Essential (primary) hypertension: Secondary | ICD-10-CM | POA: Diagnosis not present

## 2014-08-18 DIAGNOSIS — J811 Chronic pulmonary edema: Secondary | ICD-10-CM | POA: Diagnosis not present

## 2014-08-18 DIAGNOSIS — R0689 Other abnormalities of breathing: Secondary | ICD-10-CM | POA: Diagnosis not present

## 2014-08-18 DIAGNOSIS — R0789 Other chest pain: Secondary | ICD-10-CM | POA: Diagnosis not present

## 2014-08-18 LAB — CBC WITH DIFFERENTIAL/PLATELET
BASOS ABS: 0.1 10*3/uL (ref 0.0–0.1)
Basophil %: 1.1 %
EOS PCT: 2.6 %
Eosinophil #: 0.3 10*3/uL (ref 0.0–0.7)
HCT: 40.7 % (ref 35.0–47.0)
HGB: 13.2 g/dL (ref 12.0–16.0)
LYMPHS ABS: 2.4 10*3/uL (ref 1.0–3.6)
Lymphocyte %: 25.5 %
MCH: 30.9 pg (ref 26.0–34.0)
MCHC: 32.4 g/dL (ref 32.0–36.0)
MCV: 95 fL (ref 80–100)
Monocyte #: 0.7 x10 3/mm (ref 0.2–0.9)
Monocyte %: 7.3 %
Neutrophil #: 6.1 10*3/uL (ref 1.4–6.5)
Neutrophil %: 63.5 %
PLATELETS: 306 10*3/uL (ref 150–440)
RBC: 4.27 10*6/uL (ref 3.80–5.20)
RDW: 13.6 % (ref 11.5–14.5)
WBC: 9.6 10*3/uL (ref 3.6–11.0)

## 2014-08-18 LAB — COMPREHENSIVE METABOLIC PANEL
Albumin: 3.3 g/dL — ABNORMAL LOW (ref 3.4–5.0)
Alkaline Phosphatase: 122 U/L — ABNORMAL HIGH
Anion Gap: 12 (ref 7–16)
BILIRUBIN TOTAL: 0.4 mg/dL (ref 0.2–1.0)
BUN: 14 mg/dL (ref 7–18)
CHLORIDE: 112 mmol/L — AB (ref 98–107)
CO2: 20 mmol/L — AB (ref 21–32)
CREATININE: 1.29 mg/dL (ref 0.60–1.30)
Calcium, Total: 8 mg/dL — ABNORMAL LOW (ref 8.5–10.1)
EGFR (African American): 54 — ABNORMAL LOW
EGFR (Non-African Amer.): 45 — ABNORMAL LOW
Glucose: 238 mg/dL — ABNORMAL HIGH (ref 65–99)
Osmolality: 295 (ref 275–301)
Potassium: 3.5 mmol/L (ref 3.5–5.1)
SGOT(AST): 83 U/L — ABNORMAL HIGH (ref 15–37)
SGPT (ALT): 65 U/L — ABNORMAL HIGH
Sodium: 144 mmol/L (ref 136–145)
Total Protein: 6.8 g/dL (ref 6.4–8.2)

## 2014-08-18 LAB — PRO B NATRIURETIC PEPTIDE: B-Type Natriuretic Peptide: 2379 pg/mL — ABNORMAL HIGH (ref 0–125)

## 2014-08-18 LAB — TSH: Thyroid Stimulating Horm: 2.61 u[IU]/mL

## 2014-08-18 LAB — TROPONIN I: TROPONIN-I: 0.03 ng/mL

## 2014-08-19 ENCOUNTER — Inpatient Hospital Stay: Payer: Self-pay | Admitting: Internal Medicine

## 2014-08-19 DIAGNOSIS — R071 Chest pain on breathing: Secondary | ICD-10-CM | POA: Diagnosis not present

## 2014-08-19 DIAGNOSIS — R51 Headache: Secondary | ICD-10-CM | POA: Diagnosis not present

## 2014-08-19 DIAGNOSIS — R0602 Shortness of breath: Secondary | ICD-10-CM | POA: Diagnosis not present

## 2014-08-19 DIAGNOSIS — R0689 Other abnormalities of breathing: Secondary | ICD-10-CM | POA: Diagnosis not present

## 2014-08-19 DIAGNOSIS — J449 Chronic obstructive pulmonary disease, unspecified: Secondary | ICD-10-CM | POA: Diagnosis present

## 2014-08-19 DIAGNOSIS — E785 Hyperlipidemia, unspecified: Secondary | ICD-10-CM | POA: Diagnosis present

## 2014-08-19 DIAGNOSIS — F17210 Nicotine dependence, cigarettes, uncomplicated: Secondary | ICD-10-CM | POA: Diagnosis present

## 2014-08-19 DIAGNOSIS — R079 Chest pain, unspecified: Secondary | ICD-10-CM | POA: Diagnosis not present

## 2014-08-19 DIAGNOSIS — F329 Major depressive disorder, single episode, unspecified: Secondary | ICD-10-CM | POA: Diagnosis present

## 2014-08-19 DIAGNOSIS — I429 Cardiomyopathy, unspecified: Secondary | ICD-10-CM | POA: Diagnosis present

## 2014-08-19 DIAGNOSIS — I5023 Acute on chronic systolic (congestive) heart failure: Secondary | ICD-10-CM | POA: Diagnosis present

## 2014-08-19 DIAGNOSIS — Z720 Tobacco use: Secondary | ICD-10-CM | POA: Diagnosis not present

## 2014-08-19 DIAGNOSIS — J811 Chronic pulmonary edema: Secondary | ICD-10-CM | POA: Diagnosis not present

## 2014-08-19 DIAGNOSIS — T463X5A Adverse effect of coronary vasodilators, initial encounter: Secondary | ICD-10-CM | POA: Diagnosis not present

## 2014-08-19 DIAGNOSIS — Z85038 Personal history of other malignant neoplasm of large intestine: Secondary | ICD-10-CM | POA: Diagnosis not present

## 2014-08-19 DIAGNOSIS — I214 Non-ST elevation (NSTEMI) myocardial infarction: Secondary | ICD-10-CM | POA: Diagnosis not present

## 2014-08-19 DIAGNOSIS — R0789 Other chest pain: Secondary | ICD-10-CM | POA: Diagnosis not present

## 2014-08-19 DIAGNOSIS — Z7982 Long term (current) use of aspirin: Secondary | ICD-10-CM | POA: Diagnosis not present

## 2014-08-19 DIAGNOSIS — R740 Nonspecific elevation of levels of transaminase and lactic acid dehydrogenase [LDH]: Secondary | ICD-10-CM | POA: Diagnosis not present

## 2014-08-19 DIAGNOSIS — I10 Essential (primary) hypertension: Secondary | ICD-10-CM | POA: Diagnosis present

## 2014-08-19 DIAGNOSIS — I2510 Atherosclerotic heart disease of native coronary artery without angina pectoris: Secondary | ICD-10-CM | POA: Diagnosis present

## 2014-08-19 DIAGNOSIS — J810 Acute pulmonary edema: Secondary | ICD-10-CM | POA: Diagnosis not present

## 2014-08-19 DIAGNOSIS — Z951 Presence of aortocoronary bypass graft: Secondary | ICD-10-CM | POA: Diagnosis not present

## 2014-08-19 DIAGNOSIS — I222 Subsequent non-ST elevation (NSTEMI) myocardial infarction: Secondary | ICD-10-CM | POA: Diagnosis not present

## 2014-08-19 DIAGNOSIS — I25709 Atherosclerosis of coronary artery bypass graft(s), unspecified, with unspecified angina pectoris: Secondary | ICD-10-CM | POA: Diagnosis not present

## 2014-08-19 DIAGNOSIS — K219 Gastro-esophageal reflux disease without esophagitis: Secondary | ICD-10-CM | POA: Diagnosis present

## 2014-08-19 LAB — TROPONIN I
Troponin-I: 2.5 ng/mL — ABNORMAL HIGH
Troponin-I: 3.8 ng/mL — ABNORMAL HIGH

## 2014-08-19 LAB — PROTIME-INR
INR: 1.1
Prothrombin Time: 13.7 secs (ref 11.5–14.7)

## 2014-08-19 LAB — URINALYSIS, COMPLETE
Bacteria: NONE SEEN
Bilirubin,UR: NEGATIVE
Blood: NEGATIVE
Ketone: NEGATIVE
Leukocyte Esterase: NEGATIVE
NITRITE: NEGATIVE
PH: 5 (ref 4.5–8.0)
Protein: 30
Specific Gravity: 1.009 (ref 1.003–1.030)
WBC UR: 1 /HPF (ref 0–5)

## 2014-08-19 LAB — CK-MB
CK-MB: 30.6 ng/mL — AB (ref 0.5–3.6)
CK-MB: 33.2 ng/mL — AB (ref 0.5–3.6)
CK-MB: 33.2 ng/mL — ABNORMAL HIGH (ref 0.5–3.6)

## 2014-08-19 LAB — HEPARIN LEVEL (UNFRACTIONATED): Anti-Xa(Unfractionated): 0.15 IU/mL — ABNORMAL LOW (ref 0.30–0.70)

## 2014-08-19 LAB — APTT: Activated PTT: 30.8 secs (ref 23.6–35.9)

## 2014-08-20 LAB — CBC WITH DIFFERENTIAL/PLATELET
Basophil #: 0.1 10*3/uL (ref 0.0–0.1)
Basophil %: 1.1 %
EOS ABS: 0.2 10*3/uL (ref 0.0–0.7)
EOS PCT: 2.3 %
HCT: 37.3 % (ref 35.0–47.0)
HGB: 12.8 g/dL (ref 12.0–16.0)
LYMPHS ABS: 2.1 10*3/uL (ref 1.0–3.6)
LYMPHS PCT: 19.7 %
MCH: 31.9 pg (ref 26.0–34.0)
MCHC: 34.3 g/dL (ref 32.0–36.0)
MCV: 93 fL (ref 80–100)
Monocyte #: 1.1 x10 3/mm — ABNORMAL HIGH (ref 0.2–0.9)
Monocyte %: 10.6 %
NEUTROS PCT: 66.3 %
Neutrophil #: 7.1 10*3/uL — ABNORMAL HIGH (ref 1.4–6.5)
Platelet: 278 10*3/uL (ref 150–440)
RBC: 4.02 10*6/uL (ref 3.80–5.20)
RDW: 13.5 % (ref 11.5–14.5)
WBC: 10.7 10*3/uL (ref 3.6–11.0)

## 2014-08-20 LAB — BASIC METABOLIC PANEL
Anion Gap: 6 — ABNORMAL LOW (ref 7–16)
BUN: 15 mg/dL (ref 7–18)
CALCIUM: 8.2 mg/dL — AB (ref 8.5–10.1)
CO2: 28 mmol/L (ref 21–32)
CREATININE: 1 mg/dL (ref 0.60–1.30)
Chloride: 106 mmol/L (ref 98–107)
EGFR (African American): >60
EGFR (Non-African Amer.): 60 — ABNORMAL LOW
GLUCOSE: 142 mg/dL — AB (ref 65–99)
Osmolality: 283 (ref 275–301)
Potassium: 3.9 mmol/L (ref 3.5–5.1)
Sodium: 140 mmol/L (ref 136–145)

## 2014-08-20 LAB — HEPARIN LEVEL (UNFRACTIONATED): Anti-Xa(Unfractionated): <0.1 IU/mL — ABNORMAL LOW (ref 0.30–0.70)

## 2014-08-21 ENCOUNTER — Telehealth: Payer: Self-pay | Admitting: Internal Medicine

## 2014-08-21 NOTE — Telephone Encounter (Signed)
Place patient 4.30 on Tuesday all I have.

## 2014-08-21 NOTE — Telephone Encounter (Signed)
Pt needs HFU for resp distress/MI, d/c today. Please advise where to add pt to the schedule.msn

## 2014-08-25 ENCOUNTER — Ambulatory Visit (INDEPENDENT_AMBULATORY_CARE_PROVIDER_SITE_OTHER): Payer: Medicare Other | Admitting: Internal Medicine

## 2014-08-25 ENCOUNTER — Encounter: Payer: Self-pay | Admitting: Internal Medicine

## 2014-08-25 VITALS — BP 110/68 | HR 70 | Temp 97.6°F | Resp 18 | Ht 64.5 in | Wt 162.8 lb

## 2014-08-25 DIAGNOSIS — M25552 Pain in left hip: Secondary | ICD-10-CM | POA: Diagnosis not present

## 2014-08-25 DIAGNOSIS — G8929 Other chronic pain: Secondary | ICD-10-CM | POA: Diagnosis not present

## 2014-08-25 DIAGNOSIS — I739 Peripheral vascular disease, unspecified: Secondary | ICD-10-CM

## 2014-08-25 DIAGNOSIS — J438 Other emphysema: Secondary | ICD-10-CM

## 2014-08-25 DIAGNOSIS — E7800 Pure hypercholesterolemia, unspecified: Secondary | ICD-10-CM

## 2014-08-25 DIAGNOSIS — R197 Diarrhea, unspecified: Secondary | ICD-10-CM

## 2014-08-25 DIAGNOSIS — I25700 Atherosclerosis of coronary artery bypass graft(s), unspecified, with unstable angina pectoris: Secondary | ICD-10-CM | POA: Diagnosis not present

## 2014-08-25 MED ORDER — SIMVASTATIN 20 MG PO TABS: 20.0000 mg | ORAL_TABLET | Freq: Every evening | ORAL | Status: DC

## 2014-08-25 MED ORDER — FLUTICASONE-SALMETEROL 250-50 MCG/DOSE IN AEPB: 1.0000 | INHALATION_SPRAY | Freq: Two times a day (BID) | RESPIRATORY_TRACT | Status: DC

## 2014-08-25 MED ORDER — TIOTROPIUM BROMIDE MONOHYDRATE 2.5 MCG/ACT IN AERS: 2.0000 | INHALATION_SPRAY | Freq: Every day | RESPIRATORY_TRACT | Status: DC

## 2014-08-25 MED ORDER — LISINOPRIL 20 MG PO TABS: 20.0000 mg | ORAL_TABLET | Freq: Every day | ORAL | Status: DC

## 2014-08-25 MED ORDER — CARVEDILOL 6.25 MG PO TABS: 6.2500 mg | ORAL_TABLET | Freq: Two times a day (BID) | ORAL | Status: DC

## 2014-08-25 MED ORDER — FUROSEMIDE 20 MG PO TABS: 20.0000 mg | ORAL_TABLET | Freq: Every day | ORAL | Status: DC

## 2014-08-25 MED ORDER — METRONIDAZOLE 0.75 % EX CREA: 1.0000 "application " | TOPICAL_CREAM | Freq: Two times a day (BID) | CUTANEOUS | Status: DC | PRN

## 2014-08-25 MED ORDER — CLOPIDOGREL BISULFATE 75 MG PO TABS: 75.0000 mg | ORAL_TABLET | Freq: Every day | ORAL | Status: DC

## 2014-08-25 MED ORDER — ESCITALOPRAM OXALATE 10 MG PO TABS: 10.0000 mg | ORAL_TABLET | Freq: Every day | ORAL | Status: DC

## 2014-08-25 MED ORDER — OMEPRAZOLE 40 MG PO CPDR: 40.0000 mg | DELAYED_RELEASE_CAPSULE | Freq: Every day | ORAL | Status: DC

## 2014-08-25 NOTE — Patient Instructions (Signed)
I an referring you to [facility] to hve the ciruclation in your right leg checked  I have changed one of your inhalers to help your breathing  [place]uen in one month  Please return the diarrhea samples

## 2014-08-25 NOTE — Progress Notes (Signed)
Patient ID: Ashley [name], female   DOB: [birth year], 63 y.o.   MRN: [id number]  Patient Active Problem List   Diagnosis Date Noted  . Chronic right hip pain 08/26/2014  . Diarrhea 08/25/2014  . Fracture of clavicular shaft, closed 05/10/2014  . COPD (chronic obstructive pulmonary disease) 06/18/2013  . Atherosclerosis of native arteries of the extremities with intermittent claudication 06/18/2013  . Routine general medical examination at a health care facility 06/18/2013  . Left-sided chest wall pain 06/18/2013  . CAD (coronary artery disease) 06/01/2013  . GERD (gastroesophageal reflux disease) 06/01/2013  . Essential hypertension, benign 06/01/2013  . Hypercholesterolemia 06/01/2013  . Colon cancer 06/01/2013  . Depression 06/01/2013    Subjective:  CC:   Chief Complaint  Patient presents with  . Follow-up    Hospital follow up  . Congestive Heart Failure    HPI:   Ashley [name] is a 63 y.o. female who presents for  Hospital follow up.  Patient admitted to [facility] on  October  13  With acute respiratory failure requiring NIIVM, and discharged on Oct 16th after ruling in for NSTEMI.  Underwent  cardiac cath by Miquel Dunn,.  History of CABG,  Severe 3 vessel disease and dilated cardiomyopathy ,  EF 35%. Treating with medication,  Weighing self daily .   Has been taking lasix 20 mg daily since discharge and weight has changed by 1 lb transiently .  She has educed smoking to  6 cigs day using a nicotrol inhaler .  Can't afford the patches,  Makes her own cigarettes so it costs her < $20 for 3 cartons of cigs .    Having orthopnea  And difficulty using one of her inhalers.   Past Medical History  Diagnosis Date  . GERD (gastroesophageal reflux disease)   . Hypertension   . Hyperlipidemia   . Depression   . Hx of colonic polyps   . Colon cancer   . CAD (coronary artery disease)     s/p CABG  . Mitral valve disorder     s/p mitral valve repair wth CABG    Past  Surgical History  Procedure Laterality Date  . Total abdominal hysterectomy  1999    history of abnormal pap  . Cabg with mitral valve repair    . Colon surgery      colon cancer  . Arm surgery      fracture, has plates and screws       The following portions of the patient's history were reviewed and updated as appropriate: Allergies, current medications, and problem list.    Review of Systems:   Patient denies headache, fevers, malaise, unintentional weight loss, skin rash, eye pain, sinus congestion and sinus pain, sore throat, dysphagia,  hemoptysis , cough, dyspnea, wheezing, chest pain, palpitations, orthopnea, edema, abdominal pain, nausea, melena, diarrhea, constipation, flank pain, dysuria, hematuria, urinary  Frequency, nocturia, numbness, tingling, seizures,  Focal weakness, Loss of consciousness,  Tremor, insomnia, depression, anxiety, and suicidal ideation.     History   Social History  . Marital Status: Widowed    Spouse Name: N/A    Number of Children: 0  . Years of Education: N/A   Occupational History  . Not on file.   Social History Main Topics  . Smoking status: Current Every Day Smoker    Types: Cigarettes  . Smokeless tobacco: Never Used     Comment: 1/2-1 ppd  . Alcohol Use: No  . Drug Use: No  .  Sexual Activity: Not on file   Other Topics Concern  . Not on file   Social History Narrative  . No narrative on file    Objective:  Filed Vitals:   08/25/14 1610  BP: 110/68  Pulse: 70  Temp: 97.6 F (36.4 C)  Resp: 18     General appearance: alert, cooperative and appears stated age Ears: normal TM's and external ear canals both ears Throat: lips, mucosa, and tongue normal; teeth and gums normal Neck: no adenopathy, no carotid bruit, supple, symmetrical, trachea midline and thyroid not enlarged, symmetric, no tenderness/mass/nodules Back: symmetric, no curvature. ROM normal. No CVA tenderness. Lungs: clear to auscultation  bilaterally Heart: regular rate and rhythm, S1, S2 normal, no murmur, click, rub or gallop Abdomen:  no masses,  no organomegaly Pulses: nonpalpable  Skin: Skin color, texture, turgor normal. No rashes or lesions Lymph nodes: Cervical, supraclavicular, and axillary nodes normal.  Assessment and Plan:  CAD (coronary artery disease) With prior 2 vessel CABG in 2009, admitted with respiratory distress secondary to flash pulmonary edema secondry to NSTEMI.  Cardiac cath done.  Medical management advised.  COPD (chronic obstructive pulmonary disease) She continues to have orthopnea with clear lung fields today.adding Spiriva today.  Hypercholesterolemia She will return for fasting lipids in 6 weeks.  Chronic right hip pain Brought on with walking ,  Relieved with rest, with weak distal pulses,  Refer to [facility] for evaluation of peripheral circulation.   Diarrhea Since hospital dc,  Need to trule out c dif     Updated Medication List Outpatient Encounter Prescriptions as of 08/25/2014  Medication Sig  . albuterol (PROVENTIL HFA;VENTOLIN HFA) 108 (90 BASE) MCG/ACT inhaler Inhale 2 puffs into the lungs every 6 (six) hours as needed for wheezing.  [name] aspirin 81 MG tablet Take 81 mg by mouth daily.  . carvedilol (COREG) 6.25 MG tablet Take 1 tablet (6.25 mg total) by mouth 2 (two) times daily with a meal.  . clopidogrel (PLAVIX) 75 MG tablet Take 1 tablet (75 mg total) by mouth daily.  [name] escitalopram (LEXAPRO) 10 MG tablet Take 1 tablet (10 mg total) by mouth daily.  . Fluticasone-Salmeterol (ADVAIR DISKUS) 250-50 MCG/DOSE AEPB Inhale 1 puff into the lungs every 12 (twelve) hours.  . furosemide (LASIX) 20 MG tablet Take 1 tablet (20 mg total) by mouth daily.  [name] lisinopril (PRINIVIL,ZESTRIL) 20 MG tablet Take 1 tablet (20 mg total) by mouth daily.  . metroNIDAZOLE (METROCREAM) 0.75 % cream Apply 1 application topically 2 (two) times daily as needed.  . nitroGLYCERIN (NITROSTAT) 0.4 MG SL  tablet Place 0.4 mg under the tongue every 5 (five) minutes as needed for chest pain.  [name] omeprazole (PRILOSEC) 40 MG capsule Take 1 capsule (40 mg total) by mouth daily.  . simvastatin (ZOCOR) 20 MG tablet Take 1 tablet (20 mg total) by mouth every evening.  . [DISCONTINUED] Aclidinium Bromide (TUDORZA PRESSAIR) 400 MCG/ACT AEPB Inhale into the lungs.  . [DISCONTINUED] carvedilol (COREG) 6.25 MG tablet Take 6.25 mg by mouth 2 (two) times daily with a meal.  . [DISCONTINUED] clopidogrel (PLAVIX) 75 MG tablet Take 1 tablet by mouth daily.  . [DISCONTINUED] escitalopram (LEXAPRO) 10 MG tablet Take 10 mg by mouth daily.  . [DISCONTINUED] Fluticasone-Salmeterol (ADVAIR DISKUS) 250-50 MCG/DOSE AEPB Inhale 1 puff into the lungs every 12 (twelve) hours.  . [DISCONTINUED] furosemide (LASIX) 20 MG tablet Take 20 mg by mouth daily.  . [DISCONTINUED] lisinopril (PRINIVIL,ZESTRIL) 20 MG tablet Take 20 mg by  mouth daily.  . [DISCONTINUED] metroNIDAZOLE (METROCREAM) 0.75 % cream Apply 1 application topically 2 (two) times daily as needed.  . [DISCONTINUED] omeprazole (PRILOSEC) 20 MG capsule Take 20 mg by mouth 2 (two) times daily.  . [DISCONTINUED] simvastatin (ZOCOR) 20 MG tablet Take 20 mg by mouth every evening.  . diphenhydrAMINE (BENADRYL) 25 mg capsule Take 1 capsule (25 mg total) by mouth every 6 (six) hours as needed for itching.  . Tiotropium Bromide Monohydrate (SPIRIVA RESPIMAT) 2.5 MCG/ACT AERS Inhale 2 puffs into the lungs daily.  . [DISCONTINUED] predniSONE (DELTASONE) 10 MG tablet Take 60 mg (6 tablets) on the first day and taper by 10 mg (1 tablet) daily until done.  . [DISCONTINUED] promethazine (PHENERGAN) 25 MG tablet Take 1 tablet (25 mg total) by mouth every 8 (eight) hours as needed for nausea.     Orders Placed This Encounter  Procedures  . Stool C-Diff Toxin Assay  . Ambulatory referral to Vascular Surgery    Return in about 4 weeks (around 09/22/2014).

## 2014-08-25 NOTE — Progress Notes (Signed)
Pre-visit discussion using our clinic review tool. No additional management support is needed unless otherwise documented below in the visit note.  

## 2014-08-26 ENCOUNTER — Telehealth: Payer: Self-pay | Admitting: Internal Medicine

## 2014-08-26 DIAGNOSIS — G8929 Other chronic pain: Secondary | ICD-10-CM | POA: Insufficient documentation

## 2014-08-26 DIAGNOSIS — M25551 Pain in right hip: Secondary | ICD-10-CM

## 2014-08-26 NOTE — Assessment & Plan Note (Signed)
Brought on with walking ,  Relieved with rest, with weak distal pulses,  Refer to [facility] for evaluation of peripheral circulation.

## 2014-08-26 NOTE — Assessment & Plan Note (Signed)
Since hospital dc,  Need to trule out c dif

## 2014-08-26 NOTE — Assessment & Plan Note (Signed)
She will return for fasting lipids in 6 weeks.

## 2014-08-26 NOTE — Assessment & Plan Note (Signed)
With prior 2 vessel CABG in 2009, admitted with respiratory distress secondary to flash pulmonary edema secondry to NSTEMI.  Cardiac cath done.  Medical management advised.

## 2014-08-26 NOTE — Telephone Encounter (Signed)
emmi mailed  °

## 2014-08-26 NOTE — Assessment & Plan Note (Signed)
She continues to have orthopnea with clear lung fields today.adding Spiriva today.

## 2014-09-04 ENCOUNTER — Telehealth: Payer: Self-pay

## 2014-09-04 MED ORDER — DOXYCYCLINE HYCLATE 100 MG PO TABS: 100.0000 mg | ORAL_TABLET | Freq: Two times a day (BID) | ORAL | Status: AC

## 2014-09-04 MED ORDER — PREDNISONE (PAK) 10 MG PO TABS: ORAL_TABLET | ORAL | Status: DC

## 2014-09-04 MED ORDER — BENZONATATE 200 MG PO CAPS: 200.0000 mg | ORAL_CAPSULE | Freq: Three times a day (TID) | ORAL | Status: DC | PRN

## 2014-09-04 NOTE — Telephone Encounter (Signed)
Notified patient as requested .

## 2014-09-04 NOTE — Telephone Encounter (Signed)
I'm sending Doxycycline 100 mg bid , tessalon perles for cough, and a 6 day prednisone taper.   If not better by Monday we will work in.

## 2014-09-04 NOTE — Telephone Encounter (Signed)
The patient called and stated she is having a cough, congestion, and coughing up dark yellow sputum.  She advised she would probably need an ov, but refused, stating she could not come in.  She is hoping something can be called into the pharmacy for her symptoms.

## 2014-09-07 DIAGNOSIS — Z951 Presence of aortocoronary bypass graft: Secondary | ICD-10-CM | POA: Diagnosis not present

## 2014-09-07 DIAGNOSIS — E780 Pure hypercholesterolemia: Secondary | ICD-10-CM | POA: Diagnosis not present

## 2014-09-07 DIAGNOSIS — I5022 Chronic systolic (congestive) heart failure: Secondary | ICD-10-CM | POA: Diagnosis not present

## 2014-09-07 DIAGNOSIS — I214 Non-ST elevation (NSTEMI) myocardial infarction: Secondary | ICD-10-CM | POA: Diagnosis not present

## 2014-09-07 NOTE — Telephone Encounter (Signed)
The patient called and stated the tessalon pearls are not covered by her insurance.  She is hoping something else can be called in to help with her cough

## 2014-09-07 NOTE — Telephone Encounter (Signed)
Not without an office visit.  She can use Delsym otc

## 2014-09-07 NOTE — Telephone Encounter (Signed)
Please advise 

## 2014-09-08 NOTE — Telephone Encounter (Signed)
Spoke with pt advised of MDs message.  Verbalized understanding. 

## 2014-09-16 DIAGNOSIS — I5022 Chronic systolic (congestive) heart failure: Secondary | ICD-10-CM | POA: Diagnosis not present

## 2014-09-17 DIAGNOSIS — I2510 Atherosclerotic heart disease of native coronary artery without angina pectoris: Secondary | ICD-10-CM | POA: Diagnosis not present

## 2014-09-17 DIAGNOSIS — E785 Hyperlipidemia, unspecified: Secondary | ICD-10-CM | POA: Diagnosis not present

## 2014-09-17 DIAGNOSIS — I10 Essential (primary) hypertension: Secondary | ICD-10-CM | POA: Diagnosis not present

## 2014-09-21 DIAGNOSIS — I214 Non-ST elevation (NSTEMI) myocardial infarction: Secondary | ICD-10-CM | POA: Diagnosis not present

## 2014-09-21 DIAGNOSIS — E780 Pure hypercholesterolemia: Secondary | ICD-10-CM | POA: Diagnosis not present

## 2014-09-21 DIAGNOSIS — I5022 Chronic systolic (congestive) heart failure: Secondary | ICD-10-CM | POA: Diagnosis not present

## 2014-09-21 DIAGNOSIS — Z951 Presence of aortocoronary bypass graft: Secondary | ICD-10-CM | POA: Diagnosis not present

## 2014-10-05 ENCOUNTER — Ambulatory Visit: Payer: Medicare Other | Admitting: Internal Medicine

## 2014-10-09 ENCOUNTER — Encounter: Payer: Self-pay | Admitting: Internal Medicine

## 2014-10-09 ENCOUNTER — Encounter (INDEPENDENT_AMBULATORY_CARE_PROVIDER_SITE_OTHER): Payer: Self-pay

## 2014-10-09 ENCOUNTER — Ambulatory Visit (INDEPENDENT_AMBULATORY_CARE_PROVIDER_SITE_OTHER): Payer: Medicare Other | Admitting: Internal Medicine

## 2014-10-09 VITALS — BP 120/78 | HR 69 | Temp 97.4°F | Resp 16 | Ht 63.0 in | Wt 150.5 lb

## 2014-10-09 DIAGNOSIS — E7800 Pure hypercholesterolemia, unspecified: Secondary | ICD-10-CM

## 2014-10-09 DIAGNOSIS — Z720 Tobacco use: Secondary | ICD-10-CM

## 2014-10-09 DIAGNOSIS — J439 Emphysema, unspecified: Secondary | ICD-10-CM

## 2014-10-09 DIAGNOSIS — Z79899 Other long term (current) drug therapy: Secondary | ICD-10-CM

## 2014-10-09 DIAGNOSIS — I25700 Atherosclerosis of coronary artery bypass graft(s), unspecified, with unstable angina pectoris: Secondary | ICD-10-CM | POA: Diagnosis not present

## 2014-10-09 DIAGNOSIS — I10 Essential (primary) hypertension: Secondary | ICD-10-CM

## 2014-10-09 DIAGNOSIS — Z716 Tobacco abuse counseling: Secondary | ICD-10-CM

## 2014-10-09 DIAGNOSIS — I25708 Atherosclerosis of coronary artery bypass graft(s), unspecified, with other forms of angina pectoris: Secondary | ICD-10-CM | POA: Diagnosis not present

## 2014-10-09 DIAGNOSIS — R05 Cough: Secondary | ICD-10-CM | POA: Diagnosis not present

## 2014-10-09 DIAGNOSIS — J440 Chronic obstructive pulmonary disease with acute lower respiratory infection: Secondary | ICD-10-CM

## 2014-10-09 DIAGNOSIS — R059 Cough, unspecified: Secondary | ICD-10-CM

## 2014-10-09 DIAGNOSIS — D126 Benign neoplasm of colon, unspecified: Secondary | ICD-10-CM

## 2014-10-09 DIAGNOSIS — E785 Hyperlipidemia, unspecified: Secondary | ICD-10-CM | POA: Diagnosis not present

## 2014-10-09 MED ORDER — TETANUS-DIPHTH-ACELL PERTUSSIS 5-2.5-18.5 LF-MCG/0.5 IM SUSP: 0.5000 mL | Freq: Once | INTRAMUSCULAR | Status: DC

## 2014-10-09 NOTE — Patient Instructions (Addendum)
I am recommending that you trr using generic benadryl (diphenhydramine, 25 mg) around bedtime or before,  To hep your nighttime cough  I recommend getting the TDaP vaccine as soon as you can    Nicotine Addiction Nicotine can act as both a stimulant (excites/activates) and a sedative (calms/quiets). Immediately after exposure to nicotine, there is a "kick" caused in part by the drug's stimulation of the adrenal glands and resulting discharge of adrenaline (epinephrine). The rush of adrenaline stimulates the body and causes a sudden release of sugar. This means that smokers are always slightly hyperglycemic. Hyperglycemic means that the blood sugar is high, just like in diabetics. Nicotine also decreases the amount of insulin which helps control sugar levels in the body. There is an increase in blood pressure, breathing, and the rate of heart beats.  In addition, nicotine indirectly causes a release of dopamine in the brain that controls pleasure and motivation. A similar reaction is seen with other drugs of abuse, such as cocaine and heroin. This dopamine release is thought to cause the pleasurable sensations when smoking. In some different cases, nicotine can also create a calming effect, depending on sensitivity of the smoker's nervous system and the dose of nicotine taken. WHAT HAPPENS WHEN NICOTINE IS TAKEN FOR LONG PERIODS OF TIME?  Long-term use of nicotine results in addiction. It is difficult to stop.  Repeated use of nicotine creates tolerance. Higher doses of nicotine are needed to get the "kick." When nicotine use is stopped, withdrawal may last a month or more. Withdrawal may begin within a few hours after the last cigarette. Symptoms peak within the first few days and may lessen within a few weeks. For some people, however, symptoms may last for months or longer. Withdrawal symptoms include:   Irritability.  Craving.  Learning and attention deficits.  Sleep  disturbances.  Increased appetite. Craving for tobacco may last for 6 months or longer. Many behaviors done while using nicotine can also play a part in the severity of withdrawal symptoms. For some people, the feel, smell, and sight of a cigarette and the ritual of obtaining, handling, lighting, and smoking the cigarette are closely linked with the pleasure of smoking. When stopped, they also miss the related behaviors which make the withdrawal or craving worse. While nicotine gum and patches may lessen the drug aspects of withdrawal, cravings often persist. WHAT ARE THE MEDICAL CONSEQUENCES OF NICOTINE USE?  Nicotine addiction accounts for one-third of all cancers. The top cancer caused by tobacco is lung cancer. Lung cancer is the number one cancer killer of both men and women.  Smoking is also associated with cancers of the:  Mouth.  Pharynx.  Larynx.  Esophagus.  Stomach.  Pancreas.  Cervix.  Kidney.  Ureter.  Bladder.  Smoking also causes lung diseases such as lasting (chronic) bronchitis and emphysema.  It worsens asthma in adults and children.  Smoking increases the risk of heart disease, including:  Stroke.  Heart attack.  Vascular disease.  Aneurysm.  Passive or secondary smoke can also increase medical risks including:  Asthma in children.  Sudden Infant Death Syndrome (SIDS).  Additionally, dropped cigarettes are the leading cause of residential fire fatalities.  Nicotine poisoning has been reported from accidental ingestion of tobacco products by children and pets. Death usually results in a few minutes from respiratory failure (when a person stops breathing) caused by paralysis. TREATMENT   Medication. Nicotine replacement medicines such as nicotine gum and the patch are used to stop smoking.  These medicines gradually lower the dosage of nicotine in the body. These medicines do not contain the carbon monoxide and other toxins found in tobacco  smoke.  Hypnotherapy.  Relaxation therapy.  Nicotine Anonymous (a 12-step support program). Find times and locations in your local yellow pages. Document Released: 06/28/2004 Document Revised: 01/15/2012 Document Reviewed: 12/19/2013 [facility] Patient Information 2015 [place], [place]. This information is not intended to replace advice given to you by your health care provider. Make sure you discuss any questions you have with your health care provider.

## 2014-10-09 NOTE — Progress Notes (Addendum)
Patient ID: Ashley [name], female   DOB: 04-09-1951, 63 y.o.   MRN: [id number]   Patient Active Problem List   Diagnosis Date Noted  . Tobacco abuse 10/11/2014  . Tobacco abuse counseling 10/11/2014  . Chronic right hip pain 08/26/2014  . Diarrhea 08/25/2014  . Fracture of clavicular shaft, closed 05/10/2014  . COPD (chronic obstructive pulmonary disease) 06/18/2013  . Atherosclerosis of native arteries of the extremities with intermittent claudication 06/18/2013  . Routine general medical examination at a health care facility 06/18/2013  . Left-sided chest wall pain 06/18/2013  . CAD (coronary artery disease) 06/01/2013  . GERD (gastroesophageal reflux disease) 06/01/2013  . Essential hypertension, benign 06/01/2013  . Hypercholesterolemia 06/01/2013  . Tubular adenoma of colon 06/01/2013  . Depression 06/01/2013    Subjective:  CC:   Chief Complaint  Patient presents with  . Follow-up    4 week Dr. Dannielle Huh is setting up patient to have a defibulator.Cardiac Myopathy  . Cough    Patient still has productive cough mucus yellow to clear.Worse at night.    HPI:   Ashley [name] is a 63 y.o. female who presents for  6 week follow up on CAD, ongoing tobacco abuse with  recent  COPD exacerbation, hypertension, and ischemic cardiomyopathy.  She continues to report nocturnal cough that is disrupting her sleep. Delsym did not help and tessalon was not affordable.  . She continues to smoke daily and is averaging about  6 to 8 cigarettes per day.  Has not been trying to steadily reduce her consumption .  Not interested i ntrying chantix .  2) PAD:  She was referred to [facility] for evaluation of diminished  pulses in feet.  [facility] is doing an abd ultrasound next week.  She denies claudication symptoms .   [name]   3) Ischemic cardiomyopathy: Dr. Saralyn Pilar as referred her for an ICAICD given her EF of 35% and her appt with [facility] is scheduled for Jan 29th .  She has a strong FH of CAD; her  brother has a pacer/defib.         Past Medical History  Diagnosis Date  . GERD (gastroesophageal reflux disease)   . Hypertension   . Hyperlipidemia   . Depression   . Hx of colonic polyps   . Colon cancer   . CAD (coronary artery disease)     s/p CABG  . Mitral valve disorder     s/p mitral valve repair wth CABG    Past Surgical History  Procedure Laterality Date  . Total abdominal hysterectomy  1999    history of abnormal pap  . Cabg with mitral valve repair    . Colon surgery      colon cancer  . Arm surgery      fracture, has plates and screws       The following portions of the patient's history were reviewed and updated as appropriate: Allergies, current medications, and problem list.    Review of Systems:   Patient denies headache, fevers, malaise, unintentional weight loss, skin rash, eye pain, sinus congestion and sinus pain, sore throat, dysphagia,  hemoptysis , cough, dyspnea, wheezing, chest pain, palpitations, orthopnea, edema, abdominal pain, nausea, melena, diarrhea, constipation, flank pain, dysuria, hematuria, urinary  Frequency, nocturia, numbness, tingling, seizures,  Focal weakness, Loss of consciousness,  Tremor, insomnia, depression, anxiety, and suicidal ideation.     History   Social History  . Marital Status: Widowed    Spouse Name: N/A  Number of Children: 0  . Years of Education: N/A   Occupational History  . Not on file.   Social History Main Topics  . Smoking status: Current Every Day Smoker    Types: Cigarettes  . Smokeless tobacco: Never Used     Comment: 1/2-1 ppd  . Alcohol Use: No  . Drug Use: No  . Sexual Activity: Not on file   Other Topics Concern  . Not on file   Social History Narrative    Objective:  Filed Vitals:   10/09/14 1458  BP: 120/78  Pulse: 69  Temp: 97.4 F (36.3 C)  Resp: 16     General appearance: alert, cooperative and appears stated age Ears: normal TM's and external ear canals  both ears Throat: lips, mucosa, and tongue normal; teeth and gums normal Neck: no adenopathy, no carotid bruit, supple, symmetrical, trachea midline and thyroid not enlarged, symmetric, no tenderness/mass/nodules Back: symmetric, no curvature. ROM normal. No CVA tenderness. Lungs: clear to auscultation bilaterally Heart: regular rate and rhythm, S1, S2 normal, no murmur, click, rub or gallop Abdomen: soft, non-tender; bowel sounds normal; no masses,  no organomegaly Pulses: 2+ and symmetric Skin: Skin color, texture, turgor normal. No rashes or lesions Lymph nodes: Cervical, supraclavicular, and axillary nodes normal.  Assessment and Plan:  COPD (chronic obstructive pulmonary disease) Secondary to ongoing tobacco abuse. No history of PFTs or prior pulmonology referral despite recent admission to [facility]  for respiratory failure  I will not prescribe tussionex for nocturnal cough she the cough is self imposed from tobacco abuse.  Advised to try benadryl 25 mg .  PFTs have been ordered and she will follow up with Dr. Stevenson Clinch   CAD (coronary artery disease) She has been asymptomatic since discharge and taking her medications but continues to smoke.  Her prognosis is not good.  She is scheduled to see [facility] for an AICD   Essential hypertension, benign Well controlled on current regimen. Renal function stable, no changes today.  Lab Results  Component Value Date   CREATININE 0.85 10/09/2014   Lab Results  Component Value Date   NA 141 10/09/2014   K 4.3 10/09/2014   CL 104 10/09/2014   CO2 26 10/09/2014     Hypercholesterolemia Managed with simvastatin and ASA.  LFTs are normal,  LDL is not at goal.  Will discuss change to atorvastatin  Lab Results  Component Value Date   CHOL 185 10/09/2014   HDL 43 10/09/2014   LDLCALC 90 10/09/2014   LDLDIRECT 128* 10/09/2014   TRIG 261* 10/09/2014   CHOLHDL 4.3 10/09/2014   Lab Results  Component Value Date   ALT 15 10/09/2014   AST 15  10/09/2014   ALKPHOS 87 10/09/2014   BILITOT 0.4 10/09/2014     Tubular adenoma of colon S/P SIGMOID COLECTOMY August 2010 by Rochel Brome She has been lost to follow up due to cardiac issues and was referred to Dr Vira Agar recently for colonoscopy but not seen   Tobacco abuse counseling Risks of continued tobacco use were discussed. She is not currently interested in tobacco cessation.    A total of 40 minutes was spent with patient more than half of which was spent in counseling patient on the above mentioned issues , reviewing and explaining recent labs and imaging studies done, and coordination of care.  Updated Medication List Outpatient Encounter Prescriptions as of 10/09/2014  Medication Sig  . albuterol (PROVENTIL HFA;VENTOLIN HFA) 108 (90 BASE) MCG/ACT inhaler Inhale 2  puffs into the lungs every 6 (six) hours as needed for wheezing.  [name] aspirin 81 MG tablet Take 81 mg by mouth daily.  . carvedilol (COREG) 6.25 MG tablet Take 1 tablet (6.25 mg total) by mouth 2 (two) times daily with a meal.  . clopidogrel (PLAVIX) 75 MG tablet Take 1 tablet (75 mg total) by mouth daily.  . diphenhydrAMINE (BENADRYL) 25 mg capsule Take 1 capsule (25 mg total) by mouth every 6 (six) hours as needed for itching.  . escitalopram (LEXAPRO) 10 MG tablet Take 1 tablet (10 mg total) by mouth daily.  . Fluticasone-Salmeterol (ADVAIR DISKUS) 250-50 MCG/DOSE AEPB Inhale 1 puff into the lungs every 12 (twelve) hours.  . furosemide (LASIX) 20 MG tablet Take 1 tablet (20 mg total) by mouth daily.  [name] lisinopril (PRINIVIL,ZESTRIL) 20 MG tablet Take 1 tablet (20 mg total) by mouth daily.  . metroNIDAZOLE (METROCREAM) 0.75 % cream Apply 1 application topically 2 (two) times daily as needed.  . nitroGLYCERIN (NITROSTAT) 0.4 MG SL tablet Place 0.4 mg under the tongue every 5 (five) minutes as needed for chest pain.  [name] omeprazole (PRILOSEC) 40 MG capsule Take 1 capsule (40 mg total) by mouth daily.  . Tiotropium  Bromide Monohydrate (SPIRIVA RESPIMAT) 2.5 MCG/ACT AERS Inhale 2 puffs into the lungs daily.  . [DISCONTINUED] simvastatin (ZOCOR) 20 MG tablet Take 1 tablet (20 mg total) by mouth every evening.  [name] atorvastatin (LIPITOR) 40 MG tablet Take 1 tablet (40 mg total) by mouth daily.  . benzonatate (TESSALON) 200 MG capsule Take 1 capsule (200 mg total) by mouth 3 (three) times daily as needed for cough. (Patient not taking: Reported on 10/09/2014)  . predniSONE (STERAPRED UNI-PAK) 10 MG tablet 6 tablets on Day 1 , then reduce by 1 tablet daily until gone (Patient not taking: Reported on 10/09/2014)  . Tdap (BOOSTRIX) 5-2.5-18.5 LF-MCG/0.5 injection Inject 0.5 mLs into the muscle once.     Orders Placed This Encounter  Procedures  . LDL cholesterol, direct  . Lipid panel  . Comprehensive metabolic panel  . CBC with Differential  . Ambulatory referral to Pulmonology  . Pulmonary function test    Return in about 6 months (around 04/10/2015).

## 2014-10-10 LAB — LIPID PANEL
Cholesterol: 185 mg/dL (ref 0–200)
HDL: 43 mg/dL (ref >39)
LDL Cholesterol: 90 mg/dL (ref 0–99)
TRIGLYCERIDES: 261 mg/dL — AB (ref <150)
Total CHOL/HDL Ratio: 4.3 Ratio
VLDL: 52 mg/dL — AB (ref 0–40)

## 2014-10-10 LAB — CBC WITH DIFFERENTIAL/PLATELET
Basophils Absolute: 0.2 10*3/uL — ABNORMAL HIGH (ref 0.0–0.1)
Basophils Relative: 2 % — ABNORMAL HIGH (ref 0–1)
EOS ABS: 0.2 10*3/uL (ref 0.0–0.7)
Eosinophils Relative: 2 % (ref 0–5)
HEMATOCRIT: 45.1 % (ref 36.0–46.0)
HEMOGLOBIN: 15.2 g/dL — AB (ref 12.0–15.0)
LYMPHS ABS: 2.4 10*3/uL (ref 0.7–4.0)
LYMPHS PCT: 30 % (ref 12–46)
MCH: 30.6 pg (ref 26.0–34.0)
MCHC: 33.7 g/dL (ref 30.0–36.0)
MCV: 90.9 fL (ref 78.0–100.0)
MONOS PCT: 8 % (ref 3–12)
MPV: 9.2 fL — AB (ref 9.4–12.4)
Monocytes Absolute: 0.6 10*3/uL (ref 0.1–1.0)
NEUTROS PCT: 58 % (ref 43–77)
Neutro Abs: 4.6 10*3/uL (ref 1.7–7.7)
Platelets: 264 10*3/uL (ref 150–400)
RBC: 4.96 MIL/uL (ref 3.87–5.11)
RDW: 14.5 % (ref 11.5–15.5)
WBC: 8 10*3/uL (ref 4.0–10.5)

## 2014-10-10 LAB — COMPREHENSIVE METABOLIC PANEL
ALK PHOS: 87 U/L (ref 39–117)
ALT: 15 U/L (ref 0–35)
AST: 15 U/L (ref 0–37)
Albumin: 4 g/dL (ref 3.5–5.2)
BILIRUBIN TOTAL: 0.4 mg/dL (ref 0.2–1.2)
BUN: 14 mg/dL (ref 6–23)
CO2: 26 mEq/L (ref 19–32)
Calcium: 9.3 mg/dL (ref 8.4–10.5)
Chloride: 104 mEq/L (ref 96–112)
Creat: 0.85 mg/dL (ref 0.50–1.10)
Glucose, Bld: 89 mg/dL (ref 70–99)
Potassium: 4.3 mEq/L (ref 3.5–5.3)
Sodium: 141 mEq/L (ref 135–145)
Total Protein: 6.6 g/dL (ref 6.0–8.3)

## 2014-10-10 LAB — LDL CHOLESTEROL, DIRECT: LDL DIRECT: 128 mg/dL — AB

## 2014-10-11 DIAGNOSIS — Z720 Tobacco use: Secondary | ICD-10-CM | POA: Insufficient documentation

## 2014-10-11 DIAGNOSIS — Z716 Tobacco abuse counseling: Secondary | ICD-10-CM | POA: Insufficient documentation

## 2014-10-11 HISTORY — DX: Tobacco use: Z72.0

## 2014-10-11 MED ORDER — ATORVASTATIN CALCIUM 40 MG PO TABS: 40.0000 mg | ORAL_TABLET | Freq: Every day | ORAL | Status: DC

## 2014-10-11 NOTE — Assessment & Plan Note (Signed)
She has been asymptomatic since discharge and taking her medications but continues to smoke.  Her prognosis is not good.  She is scheduled to see [facility] for an AICD

## 2014-10-11 NOTE — Assessment & Plan Note (Signed)
Risks of continued tobacco use were discussed. She is not currently interested in tobacco cessation.    

## 2014-10-11 NOTE — Assessment & Plan Note (Signed)
Well controlled on current regimen. Renal function stable, no changes today.  Lab Results  Component Value Date   CREATININE 0.85 10/09/2014   Lab Results  Component Value Date   NA 141 10/09/2014   K 4.3 10/09/2014   CL 104 10/09/2014   CO2 26 10/09/2014

## 2014-10-11 NOTE — Assessment & Plan Note (Signed)
Managed with simvastatin and ASA.  LFTs are normal,  LDL is not at goal.  Will discuss change to atorvastatin  Lab Results  Component Value Date   CHOL 185 10/09/2014   HDL 43 10/09/2014   LDLCALC 90 10/09/2014   LDLDIRECT 128* 10/09/2014   TRIG 261* 10/09/2014   CHOLHDL 4.3 10/09/2014   Lab Results  Component Value Date   ALT 15 10/09/2014   AST 15 10/09/2014   ALKPHOS 87 10/09/2014   BILITOT 0.4 10/09/2014

## 2014-10-11 NOTE — Assessment & Plan Note (Addendum)
S/P SIGMOID COLECTOMY August 2010 by Rochel Brome She has been lost to follow up due to cardiac issues and was referred to Dr Vira Agar recently for colonoscopy but not seen

## 2014-10-11 NOTE — Assessment & Plan Note (Addendum)
Secondary to ongoing tobacco abuse. No history of PFTs or prior pulmonology referral despite recent admission to [facility]  for respiratory failure  I will not prescribe tussionex for nocturnal cough she the cough is self imposed from tobacco abuse.  Advised to try benadryl 25 mg .  PFTs have been ordered and she will follow up with Dr. Stevenson Clinch

## 2014-10-11 NOTE — Addendum Note (Signed)
Addended by: [name] on: 10/11/2014 03:51 PM   Modules accepted: Orders, Medications

## 2014-10-14 DIAGNOSIS — I70219 Atherosclerosis of native arteries of extremities with intermittent claudication, unspecified extremity: Secondary | ICD-10-CM | POA: Diagnosis not present

## 2014-10-15 ENCOUNTER — Ambulatory Visit: Payer: Self-pay | Admitting: Internal Medicine

## 2014-10-15 DIAGNOSIS — J449 Chronic obstructive pulmonary disease, unspecified: Secondary | ICD-10-CM | POA: Diagnosis not present

## 2014-10-15 DIAGNOSIS — Z0189 Encounter for other specified special examinations: Secondary | ICD-10-CM | POA: Diagnosis not present

## 2014-10-15 DIAGNOSIS — J439 Emphysema, unspecified: Secondary | ICD-10-CM | POA: Diagnosis not present

## 2014-10-15 LAB — PULMONARY FUNCTION TEST

## 2014-10-16 ENCOUNTER — Encounter: Payer: Self-pay | Admitting: *Deleted

## 2014-10-19 ENCOUNTER — Ambulatory Visit: Payer: Medicare Other | Admitting: Internal Medicine

## 2014-10-19 DIAGNOSIS — E785 Hyperlipidemia, unspecified: Secondary | ICD-10-CM | POA: Diagnosis not present

## 2014-10-19 DIAGNOSIS — I70211 Atherosclerosis of native arteries of extremities with intermittent claudication, right leg: Secondary | ICD-10-CM | POA: Diagnosis not present

## 2014-10-19 DIAGNOSIS — I2510 Atherosclerotic heart disease of native coronary artery without angina pectoris: Secondary | ICD-10-CM | POA: Diagnosis not present

## 2014-10-19 DIAGNOSIS — I10 Essential (primary) hypertension: Secondary | ICD-10-CM | POA: Diagnosis not present

## 2014-10-21 ENCOUNTER — Ambulatory Visit (INDEPENDENT_AMBULATORY_CARE_PROVIDER_SITE_OTHER): Payer: Medicare Other | Admitting: Internal Medicine

## 2014-10-21 ENCOUNTER — Encounter: Payer: Self-pay | Admitting: Internal Medicine

## 2014-10-21 VITALS — BP 120/76 | HR 74 | Temp 97.6°F | Ht 64.5 in | Wt 159.0 lb

## 2014-10-21 DIAGNOSIS — R05 Cough: Secondary | ICD-10-CM

## 2014-10-21 DIAGNOSIS — Z716 Tobacco abuse counseling: Secondary | ICD-10-CM | POA: Diagnosis not present

## 2014-10-21 DIAGNOSIS — J440 Chronic obstructive pulmonary disease with acute lower respiratory infection: Secondary | ICD-10-CM

## 2014-10-21 DIAGNOSIS — R053 Chronic cough: Secondary | ICD-10-CM

## 2014-10-21 DIAGNOSIS — I25700 Atherosclerosis of coronary artery bypass graft(s), unspecified, with unstable angina pectoris: Secondary | ICD-10-CM | POA: Diagnosis not present

## 2014-10-21 DIAGNOSIS — R059 Cough, unspecified: Secondary | ICD-10-CM | POA: Insufficient documentation

## 2014-10-21 NOTE — Progress Notes (Signed)
Date: 10/21/2014  MRN# [id number] Ashley [name] 1951-05-29  Referring Physician:   JUDY NOLF is a 63 y.o. old female seen in consultation for COPD evaluation   CC: "cough and sob" Chief Complaint  Patient presents with  . Advice Only    Referred by [name] for sob with exhertion, cough and wheezing. Pt denies chest congestion. She has nasal congestion.l    HPI:  Patient is a pleasant 63 year old female presents today for further evaluation of suspected COPD and results of the PFTs, she is also accompanied by her sister. She was referred to [facility] pulmonary by her primary care physician Dr. Derrel Nip. History per patient, who states that she's been smoking for the past 50 years, at the highest was smoking 3 packs per day, currently down to 4-5 cigarettes per day. Currently she cannot afford to by cigarette packs si she has been making her own cigarettes with tobacco and rolled paper. She has a past medical history significant for hypertension, hyperlipidemia, CAD status post CABG, peripheral vascular disease, tobacco abuse. Per patient she can walk about 50 yards before getting short of breath, patient states that this is mostly due to her legs starting to hurt. In the last 2 years she denies any hospitalizations or emergency room visits for shortness of breath with COPD exacerbations. Patient states that in November of 2014 she did receive prednisone and antibiotics for "bronchitis." Patient can walk about one flight of stairs before becoming dyspneic. Patient states that she has a intermittent morning cough, usually thick white sputum. She recently saw her PMD and given her smoking history along with dyspnea a pulmonary referral was ordered along with pulmonary function testing. Current COPD meds are Advair 250/50, Spiriva, when necessary albuterol, which has been managing her well.   Recent hospital admission from 08/19/2014 to 08/21/2014  62 year old Caucasian female with past  medical history significant for history of unstable angina, chronic stable angina, also history of coronary artery disease, status post coronary artery bypass grafting in 2007 with multiple occluded vessels, medical therapy was recommended, history of hypertension, history of gastroesophageal reflux disease as well as COPD who is not on oxygen at home presented to the hospital with complaints of sudden onset of shortness of breath. Apparently the patient was doing well up until 4 or 5 weeks ago when she started having spells of shortness of breath. She would have sudden onset of lung wheezing as well as shortness of breath as well as some chest discomfort and today she was so uncomfortable that she called EMS, she was about to pass out. On EMS arrival the patient's blood pressure was found to be 180/120 and she was brought to the Emergency Room for further evaluation. She required CPAP and here in the Emergency Room BiPAP for oxygenation. Now she is diuresed since her exam, physical exam revealed congestive heart failure and she feels much better. She is off oxygen completely and her blood pressure seemed to be good. She admits of having some discomfort in the chest, however, denies any significant pains.  Hospital course: 63 yo female w/ hx of HTN, Hyperlipidemia, COPD, Depression, GERD came into hospital due to shortness of breath, Chest pain.    1.  Shortness of breath -  ac systolic CHF- low EF per Cath report.  - clinically improved  no wheezing/ copd. - cont. Lasix for CHF and follow I's and O's and daily weights.  Appreciated Cards input.    2. NSTEMI - does have risk factors  for CAD given HTN, Hyperlipidemia, tobacco abuse.   PMHX:   Past Medical History  Diagnosis Date  . GERD (gastroesophageal reflux disease)   . Hypertension   . Hyperlipidemia   . Depression   . Hx of colonic polyps   . Colon cancer   . CAD (coronary artery disease)     s/p CABG  . Mitral valve disorder     s/p  mitral valve repair wth CABG   Surgical Hx:  Past Surgical History  Procedure Laterality Date  . Total abdominal hysterectomy  1999    history of abnormal pap  . Cabg with mitral valve repair    . Colon surgery      colon cancer  . Arm surgery      fracture, has plates and screws   Family Hx:  Family History  Problem Relation Age of Onset  . Arthritis Mother   . Cancer Mother     uterus cancer  . Hyperlipidemia Mother   . Hypertension Mother   . Heart disease Mother   . Diabetes Mother   . Hyperlipidemia Father   . Hypertension Father   . Heart disease Father   . Diabetes Father   . Cancer Sister     ovary cancer  . Diabetes Maternal Grandmother   . Hypertension Maternal Grandmother   . Arthritis Maternal Grandmother   . Hypertension Maternal Grandfather   . Hypertension Paternal Grandmother   . Hypertension Paternal Grandfather   . Heart disease Paternal Grandfather    Social Hx:   History  Substance Use Topics  . Smoking status: Current Every Day Smoker    Types: Cigarettes  . Smokeless tobacco: Never Used     Comment: 1/2-1 ppd  . Alcohol Use: No   Medication:   Current Outpatient Rx  Name  Route  Sig  Dispense  Refill  . albuterol (PROVENTIL HFA;VENTOLIN HFA) 108 (90 BASE) MCG/ACT inhaler   Inhalation   Inhale 2 puffs into the lungs every 6 (six) hours as needed for wheezing.         [name] aspirin 81 MG tablet   Oral   Take 81 mg by mouth daily.         [name] atorvastatin (LIPITOR) 40 MG tablet   Oral   Take 1 tablet (40 mg total) by mouth daily.   90 tablet   3     Replaces simvastatin, fill when simvastatin refill ...   . benzonatate (TESSALON) 200 MG capsule   Oral   Take 1 capsule (200 mg total) by mouth 3 (three) times daily as needed for cough. Patient not taking: Reported on 10/09/2014   60 capsule   1   . carvedilol (COREG) 6.25 MG tablet   Oral   Take 1 tablet (6.25 mg total) by mouth 2 (two) times daily with a meal.   180 tablet    1   . clopidogrel (PLAVIX) 75 MG tablet   Oral   Take 1 tablet (75 mg total) by mouth daily.   90 tablet   1   . diphenhydrAMINE (BENADRYL) 25 mg capsule   Oral   Take 1 capsule (25 mg total) by mouth every 6 (six) hours as needed for itching.   30 capsule   0   . escitalopram (LEXAPRO) 10 MG tablet   Oral   Take 1 tablet (10 mg total) by mouth daily.   90 tablet   1   . Fluticasone-Salmeterol (ADVAIR DISKUS) 250-50 MCG/DOSE  AEPB   Inhalation   Inhale 1 puff into the lungs every 12 (twelve) hours.   180 each   1   . furosemide (LASIX) 20 MG tablet   Oral   Take 1 tablet (20 mg total) by mouth daily.   90 tablet   1   . lisinopril (PRINIVIL,ZESTRIL) 20 MG tablet   Oral   Take 1 tablet (20 mg total) by mouth daily.   90 tablet   1   . metroNIDAZOLE (METROCREAM) 0.75 % cream   Topical   Apply 1 application topically 2 (two) times daily as needed.   60 g   3   . nitroGLYCERIN (NITROSTAT) 0.4 MG SL tablet   Sublingual   Place 0.4 mg under the tongue every 5 (five) minutes as needed for chest pain.         [name] omeprazole (PRILOSEC) 40 MG capsule   Oral   Take 1 capsule (40 mg total) by mouth daily.   90 capsule   1   . predniSONE (STERAPRED UNI-PAK) 10 MG tablet      6 tablets on Day 1 , then reduce by 1 tablet daily until gone Patient not taking: Reported on 10/09/2014   21 tablet   0   . Tdap (BOOSTRIX) 5-2.5-18.5 LF-MCG/0.5 injection   Intramuscular   Inject 0.5 mLs into the muscle once.   0.5 mL   0   . Tiotropium Bromide Monohydrate (SPIRIVA RESPIMAT) 2.5 MCG/ACT AERS   Inhalation   Inhale 2 puffs into the lungs daily.   4 g   0       Allergies:  Hydrocodone-acetaminophen  Review of Systems: Gen:  Denies  fever, sweats, chills HEENT: Denies blurred vision, double vision, ear pain, eye pain, hearing loss, nose bleeds, sore throat Cvc:  No dizziness, chest pain or heaviness Resp:   Admits to cough or sputum porduction, shortness of  breath Gi: Denies swallowing difficulty, stomach pain, nausea or vomiting, diarrhea, constipation, bowel incontinence Gu:  Denies bladder incontinence, burning urine Ext:   No Joint pain, stiffness or swelling Skin: No skin rash, easy bruising or bleeding or hives Endoc:  No polyuria, polydipsia , polyphagia or weight change Psych: No depression, insomnia or hallucinations  Other:  All other systems negative  Physical Examination:   VS: BP 120/76 mmHg  Pulse 74  Temp(Src) 97.6 F (36.4 C) (Oral)  Ht 5' 4.5" (1.638 m)  Wt 159 lb (72.122 kg)  BMI 26.88 kg/m2  SpO2 97%  General Appearance: No distress  Neuro:without focal findings, mental status, speech normal, alert and oriented, cranial nerves 2-12 intact, reflexes normal and symmetric, sensation grossly normal  HEENT: PERRLA, EOM intact, no ptosis, no other lesions noticed; Mallampati 2 Pulmonary: coarse upper airway sounds, dec basilar breath sounds otherwise good airway entry with no wheezes\crackles\ronchi;   Sputum Production:  Thick white CardiovascularNormal S1,S2.  No m/r/g.  Abdominal aorta pulsation normal.    Abdomen: Benign, Soft, non-tender, No masses, hepatosplenomegaly, No lymphadenopathy Renal:  No costovertebral tenderness  GU:  No performed at this time. Endoc: No evident thyromegaly, no signs of acromegaly or Cushing features Skin:   warm, no rashes, no ecchymosis  Extremities: normal, no cyanosis, no edema, warm with normal capillary refill. Other findings: clubbing noted and lines on nails noted      Rad results:  Echo 08/19/2014 Lithotripsy ejection fraction by visual estimation is 30-40% Mollie to moderate decreased global left systolic function Moderate left ventricular hypertrophy Moderately dilated  left atrium Moderately dilated right atrium Moderate mitral valve regurgitation Mild to moderate tricuspid regurgitation Severely increased left ventricular posterior wall thickness RVSP:  25.15mmHg  Chest x-ray 08/20/2014 The appearance of the chest was compatible with mild residual interstitial pulmonary edema, improved compared to the recent examination. Arthrosclerosis Postoperative changes, status post median sternotomy for CABG, postoperative changes of ORIF in the left clavicle incidentally noted.  Chest x-ray 08/18/2014 Mild pulmonary edema and worsening bibasal opacities, atelectasis versus infiltrate. A followup chest x-ray in 4-6 weeks after treatment is recommended to ensure resolution.   Pulmonary function testing 10/15/2014 FEV1 102% FVC 97% FEV1/FVC 77% FVC 2.84 L FEV1 2.2 L No significant response to bronchodilation RV 77% ERV 75% TLC 94% VC 105% DLCO 76% Assessment and Plan: COPD (chronic obstructive pulmonary disease) COPD Level of obstruction: Mild Class A  Plan: - Patient currently on Advair and Spiriva which seems to be managing her well keeping her in a category A designation, will continue with these medications and when necessary albuterol. - patient is still having some cough with dyspnea on exertion, will further evaluate with a CT without contrast. - Patient educated on proper use of Advair and Spiriva and compliance with these medications.  Tobacco abuse counseling Tobacco Cessation - Counseling regarding benefits of smoking cessation strategies was provided for more than 12 min. - Educated that at this time smoking- cessation represents the single most important step that patient can take to enhance the length and quality of live. - Educated patient regarding alternatives of behavior interventions, pharmacotherapy including NRT and non-nicotine therapy such, and combinations of both. - Patient at this time: trying to quit on her own - Quit date: none set  Discuss osa screening next visit    Updated Medication List Outpatient Encounter Prescriptions as of 10/21/2014  Medication Sig  . albuterol (PROVENTIL HFA;VENTOLIN HFA) 108  (90 BASE) MCG/ACT inhaler Inhale 2 puffs into the lungs every 6 (six) hours as needed for wheezing.  [name] aspirin 81 MG tablet Take 81 mg by mouth daily.  [name] atorvastatin (LIPITOR) 40 MG tablet Take 1 tablet (40 mg total) by mouth daily.  . benzonatate (TESSALON) 200 MG capsule Take 1 capsule (200 mg total) by mouth 3 (three) times daily as needed for cough.  . carvedilol (COREG) 6.25 MG tablet Take 1 tablet (6.25 mg total) by mouth 2 (two) times daily with a meal.  . clopidogrel (PLAVIX) 75 MG tablet Take 1 tablet (75 mg total) by mouth daily.  . diphenhydrAMINE (BENADRYL) 25 mg capsule Take 1 capsule (25 mg total) by mouth every 6 (six) hours as needed for itching.  . escitalopram (LEXAPRO) 10 MG tablet Take 1 tablet (10 mg total) by mouth daily.  . Fluticasone-Salmeterol (ADVAIR DISKUS) 250-50 MCG/DOSE AEPB Inhale 1 puff into the lungs every 12 (twelve) hours.  . furosemide (LASIX) 20 MG tablet Take 1 tablet (20 mg total) by mouth daily.  [name] lisinopril (PRINIVIL,ZESTRIL) 20 MG tablet Take 1 tablet (20 mg total) by mouth daily.  . metroNIDAZOLE (METROCREAM) 0.75 % cream Apply 1 application topically 2 (two) times daily as needed.  . nitroGLYCERIN (NITROSTAT) 0.4 MG SL tablet Place 0.4 mg under the tongue every 5 (five) minutes as needed for chest pain.  [name] omeprazole (PRILOSEC) 40 MG capsule Take 1 capsule (40 mg total) by mouth daily.  . predniSONE (STERAPRED UNI-PAK) 10 MG tablet 6 tablets on Day 1 , then reduce by 1 tablet daily until gone  . Tdap (BOOSTRIX) 5-2.5-18.5 LF-MCG/0.5  injection Inject 0.5 mLs into the muscle once.  . Tiotropium Bromide Monohydrate (SPIRIVA RESPIMAT) 2.5 MCG/ACT AERS Inhale 2 puffs into the lungs daily.  . simvastatin (ZOCOR) 20 MG tablet Take 20 mg by mouth at bedtime.    Orders for this visit: Orders Placed This Encounter  Procedures  . CT Chest Wo Contrast    Standing Status: Future     Number of Occurrences:      Standing Expiration Date: 12/23/2015    Order  Specific Question:  Reason for Exam (SYMPTOM  OR DIAGNOSIS REQUIRED)    Answer:  cough, COPD    Order Specific Question:  Preferred imaging location?    Answer:  [facility]     Thank  you for the consultation and for allowing [facility] to assist in the care of your patient. Our recommendations are noted above.  Please contact us if we can be of further service.   Vilinda Boehringer, MD [facility] Office Number: [phone]

## 2014-10-21 NOTE — Assessment & Plan Note (Signed)
COPD Level of obstruction: Mild Class A  Plan: - Patient currently on Advair and Spiriva which seems to be managing her well keeping her in a category A designation, will continue with these medications and when necessary albuterol. - patient is still having some cough with dyspnea on exertion, will further evaluate with a CT without contrast. - Patient educated on proper use of Advair and Spiriva and compliance with these medications.

## 2014-10-21 NOTE — Assessment & Plan Note (Signed)
Tobacco Cessation - Counseling regarding benefits of smoking cessation strategies was provided for more than 12 min. - Educated that at this time smoking- cessation represents the single most important step that patient can take to enhance the length and quality of live. - Educated patient regarding alternatives of behavior interventions, pharmacotherapy including NRT and non-nicotine therapy such, and combinations of both. - Patient at this time: trying to quit on her own - Quit date: none set  Discuss osa screening next visit

## 2014-10-21 NOTE — Patient Instructions (Signed)
We will order a CT of your chest. Follow up with Dr. Stevenson Clinch in 2 months.

## 2014-10-26 ENCOUNTER — Ambulatory Visit: Payer: Self-pay | Admitting: Internal Medicine

## 2014-10-26 DIAGNOSIS — J449 Chronic obstructive pulmonary disease, unspecified: Secondary | ICD-10-CM | POA: Diagnosis not present

## 2014-10-26 DIAGNOSIS — R918 Other nonspecific abnormal finding of lung field: Secondary | ICD-10-CM | POA: Diagnosis not present

## 2014-10-27 ENCOUNTER — Ambulatory Visit: Payer: Self-pay | Admitting: Vascular Surgery

## 2014-10-27 DIAGNOSIS — I10 Essential (primary) hypertension: Secondary | ICD-10-CM | POA: Diagnosis not present

## 2014-10-27 DIAGNOSIS — K219 Gastro-esophageal reflux disease without esophagitis: Secondary | ICD-10-CM | POA: Diagnosis not present

## 2014-10-27 DIAGNOSIS — I2510 Atherosclerotic heart disease of native coronary artery without angina pectoris: Secondary | ICD-10-CM | POA: Diagnosis not present

## 2014-10-27 DIAGNOSIS — F329 Major depressive disorder, single episode, unspecified: Secondary | ICD-10-CM | POA: Diagnosis not present

## 2014-10-27 DIAGNOSIS — C189 Malignant neoplasm of colon, unspecified: Secondary | ICD-10-CM | POA: Diagnosis not present

## 2014-10-27 DIAGNOSIS — I70213 Atherosclerosis of native arteries of extremities with intermittent claudication, bilateral legs: Secondary | ICD-10-CM | POA: Diagnosis not present

## 2014-10-27 DIAGNOSIS — F17200 Nicotine dependence, unspecified, uncomplicated: Secondary | ICD-10-CM | POA: Diagnosis not present

## 2014-10-27 DIAGNOSIS — I771 Stricture of artery: Secondary | ICD-10-CM | POA: Diagnosis not present

## 2014-10-27 DIAGNOSIS — J449 Chronic obstructive pulmonary disease, unspecified: Secondary | ICD-10-CM | POA: Diagnosis not present

## 2014-10-27 DIAGNOSIS — E780 Pure hypercholesterolemia: Secondary | ICD-10-CM | POA: Diagnosis not present

## 2014-10-27 DIAGNOSIS — I70211 Atherosclerosis of native arteries of extremities with intermittent claudication, right leg: Secondary | ICD-10-CM | POA: Diagnosis not present

## 2014-10-27 DIAGNOSIS — Z7982 Long term (current) use of aspirin: Secondary | ICD-10-CM | POA: Diagnosis not present

## 2014-10-27 LAB — BASIC METABOLIC PANEL
Anion Gap: 6 — ABNORMAL LOW (ref 7–16)
BUN: 12 mg/dL (ref 7–18)
CALCIUM: 9 mg/dL (ref 8.5–10.1)
CHLORIDE: 108 mmol/L — AB (ref 98–107)
CREATININE: 0.98 mg/dL (ref 0.60–1.30)
Co2: 27 mmol/L (ref 21–32)
EGFR (African American): >60
Glucose: 93 mg/dL (ref 65–99)
OSMOLALITY: 281 (ref 275–301)
Potassium: 4 mmol/L (ref 3.5–5.1)
Sodium: 141 mmol/L (ref 136–145)

## 2014-11-04 ENCOUNTER — Telehealth: Payer: Self-pay | Admitting: Internal Medicine

## 2014-11-06 HISTORY — PX: OTHER SURGICAL HISTORY: SHX169

## 2014-12-02 ENCOUNTER — Encounter: Payer: Self-pay | Admitting: Internal Medicine

## 2014-12-04 DIAGNOSIS — I495 Sick sinus syndrome: Secondary | ICD-10-CM | POA: Diagnosis not present

## 2014-12-04 DIAGNOSIS — I429 Cardiomyopathy, unspecified: Secondary | ICD-10-CM | POA: Diagnosis not present

## 2014-12-22 ENCOUNTER — Ambulatory Visit: Payer: Medicare Other | Admitting: Internal Medicine

## 2015-01-05 DIAGNOSIS — I739 Peripheral vascular disease, unspecified: Secondary | ICD-10-CM | POA: Insufficient documentation

## 2015-01-05 DIAGNOSIS — K219 Gastro-esophageal reflux disease without esophagitis: Secondary | ICD-10-CM | POA: Diagnosis not present

## 2015-01-05 DIAGNOSIS — E785 Hyperlipidemia, unspecified: Secondary | ICD-10-CM | POA: Diagnosis not present

## 2015-01-05 DIAGNOSIS — Z951 Presence of aortocoronary bypass graft: Secondary | ICD-10-CM | POA: Diagnosis not present

## 2015-01-05 DIAGNOSIS — I495 Sick sinus syndrome: Secondary | ICD-10-CM | POA: Diagnosis not present

## 2015-01-05 DIAGNOSIS — I501 Left ventricular failure: Secondary | ICD-10-CM | POA: Diagnosis not present

## 2015-01-05 DIAGNOSIS — J449 Chronic obstructive pulmonary disease, unspecified: Secondary | ICD-10-CM | POA: Diagnosis not present

## 2015-01-05 DIAGNOSIS — I429 Cardiomyopathy, unspecified: Secondary | ICD-10-CM | POA: Diagnosis not present

## 2015-01-05 DIAGNOSIS — I5022 Chronic systolic (congestive) heart failure: Secondary | ICD-10-CM | POA: Diagnosis not present

## 2015-01-06 DIAGNOSIS — F17210 Nicotine dependence, cigarettes, uncomplicated: Secondary | ICD-10-CM | POA: Diagnosis not present

## 2015-01-06 DIAGNOSIS — J069 Acute upper respiratory infection, unspecified: Secondary | ICD-10-CM | POA: Diagnosis not present

## 2015-01-06 DIAGNOSIS — I10 Essential (primary) hypertension: Secondary | ICD-10-CM | POA: Diagnosis not present

## 2015-01-06 DIAGNOSIS — I2510 Atherosclerotic heart disease of native coronary artery without angina pectoris: Secondary | ICD-10-CM | POA: Diagnosis not present

## 2015-01-06 DIAGNOSIS — F329 Major depressive disorder, single episode, unspecified: Secondary | ICD-10-CM | POA: Diagnosis not present

## 2015-01-06 DIAGNOSIS — I252 Old myocardial infarction: Secondary | ICD-10-CM | POA: Diagnosis not present

## 2015-01-06 DIAGNOSIS — R05 Cough: Secondary | ICD-10-CM | POA: Diagnosis not present

## 2015-01-06 DIAGNOSIS — I255 Ischemic cardiomyopathy: Secondary | ICD-10-CM | POA: Diagnosis not present

## 2015-01-06 DIAGNOSIS — R079 Chest pain, unspecified: Secondary | ICD-10-CM | POA: Diagnosis not present

## 2015-01-06 DIAGNOSIS — Z85038 Personal history of other malignant neoplasm of large intestine: Secondary | ICD-10-CM | POA: Diagnosis not present

## 2015-01-06 DIAGNOSIS — Z95810 Presence of automatic (implantable) cardiac defibrillator: Secondary | ICD-10-CM | POA: Diagnosis not present

## 2015-01-06 DIAGNOSIS — R062 Wheezing: Secondary | ICD-10-CM | POA: Diagnosis not present

## 2015-01-06 DIAGNOSIS — Z886 Allergy status to analgesic agent status: Secondary | ICD-10-CM | POA: Diagnosis not present

## 2015-01-06 DIAGNOSIS — I5022 Chronic systolic (congestive) heart failure: Secondary | ICD-10-CM | POA: Diagnosis not present

## 2015-01-06 DIAGNOSIS — I495 Sick sinus syndrome: Secondary | ICD-10-CM | POA: Diagnosis not present

## 2015-01-06 DIAGNOSIS — I429 Cardiomyopathy, unspecified: Secondary | ICD-10-CM | POA: Diagnosis not present

## 2015-01-06 DIAGNOSIS — R509 Fever, unspecified: Secondary | ICD-10-CM | POA: Diagnosis not present

## 2015-01-06 DIAGNOSIS — K219 Gastro-esophageal reflux disease without esophagitis: Secondary | ICD-10-CM | POA: Diagnosis not present

## 2015-01-06 DIAGNOSIS — J441 Chronic obstructive pulmonary disease with (acute) exacerbation: Secondary | ICD-10-CM | POA: Diagnosis not present

## 2015-01-06 DIAGNOSIS — I454 Nonspecific intraventricular block: Secondary | ICD-10-CM | POA: Diagnosis not present

## 2015-01-06 DIAGNOSIS — I5042 Chronic combined systolic (congestive) and diastolic (congestive) heart failure: Secondary | ICD-10-CM | POA: Diagnosis not present

## 2015-01-06 DIAGNOSIS — B349 Viral infection, unspecified: Secondary | ICD-10-CM | POA: Diagnosis not present

## 2015-01-06 DIAGNOSIS — Z006 Encounter for examination for normal comparison and control in clinical research program: Secondary | ICD-10-CM | POA: Diagnosis not present

## 2015-01-06 DIAGNOSIS — Z951 Presence of aortocoronary bypass graft: Secondary | ICD-10-CM | POA: Diagnosis not present

## 2015-01-07 DIAGNOSIS — Z95810 Presence of automatic (implantable) cardiac defibrillator: Secondary | ICD-10-CM | POA: Diagnosis not present

## 2015-01-07 DIAGNOSIS — R918 Other nonspecific abnormal finding of lung field: Secondary | ICD-10-CM | POA: Diagnosis not present

## 2015-01-07 DIAGNOSIS — I517 Cardiomegaly: Secondary | ICD-10-CM | POA: Diagnosis not present

## 2015-01-07 DIAGNOSIS — Z950 Presence of cardiac pacemaker: Secondary | ICD-10-CM | POA: Diagnosis not present

## 2015-01-07 DIAGNOSIS — S42002A Fracture of unspecified part of left clavicle, initial encounter for closed fracture: Secondary | ICD-10-CM | POA: Diagnosis not present

## 2015-01-07 DIAGNOSIS — I5022 Chronic systolic (congestive) heart failure: Secondary | ICD-10-CM | POA: Diagnosis not present

## 2015-01-08 DIAGNOSIS — I252 Old myocardial infarction: Secondary | ICD-10-CM | POA: Diagnosis not present

## 2015-01-08 DIAGNOSIS — R05 Cough: Secondary | ICD-10-CM | POA: Diagnosis not present

## 2015-01-08 DIAGNOSIS — R509 Fever, unspecified: Secondary | ICD-10-CM | POA: Diagnosis not present

## 2015-01-08 DIAGNOSIS — K219 Gastro-esophageal reflux disease without esophagitis: Secondary | ICD-10-CM | POA: Diagnosis not present

## 2015-01-08 DIAGNOSIS — J441 Chronic obstructive pulmonary disease with (acute) exacerbation: Secondary | ICD-10-CM | POA: Diagnosis not present

## 2015-01-08 DIAGNOSIS — R079 Chest pain, unspecified: Secondary | ICD-10-CM | POA: Diagnosis not present

## 2015-01-08 DIAGNOSIS — I255 Ischemic cardiomyopathy: Secondary | ICD-10-CM | POA: Diagnosis not present

## 2015-01-08 DIAGNOSIS — I2510 Atherosclerotic heart disease of native coronary artery without angina pectoris: Secondary | ICD-10-CM | POA: Diagnosis not present

## 2015-01-08 DIAGNOSIS — J069 Acute upper respiratory infection, unspecified: Secondary | ICD-10-CM | POA: Diagnosis not present

## 2015-01-08 DIAGNOSIS — F329 Major depressive disorder, single episode, unspecified: Secondary | ICD-10-CM | POA: Diagnosis not present

## 2015-01-08 DIAGNOSIS — B349 Viral infection, unspecified: Secondary | ICD-10-CM | POA: Diagnosis not present

## 2015-01-08 DIAGNOSIS — Z951 Presence of aortocoronary bypass graft: Secondary | ICD-10-CM | POA: Diagnosis not present

## 2015-01-08 DIAGNOSIS — Z886 Allergy status to analgesic agent status: Secondary | ICD-10-CM | POA: Diagnosis not present

## 2015-01-08 DIAGNOSIS — Z85038 Personal history of other malignant neoplasm of large intestine: Secondary | ICD-10-CM | POA: Diagnosis not present

## 2015-01-08 DIAGNOSIS — F17210 Nicotine dependence, cigarettes, uncomplicated: Secondary | ICD-10-CM | POA: Diagnosis not present

## 2015-01-08 DIAGNOSIS — I5022 Chronic systolic (congestive) heart failure: Secondary | ICD-10-CM | POA: Diagnosis not present

## 2015-01-08 DIAGNOSIS — I10 Essential (primary) hypertension: Secondary | ICD-10-CM | POA: Diagnosis not present

## 2015-01-08 DIAGNOSIS — R062 Wheezing: Secondary | ICD-10-CM | POA: Diagnosis not present

## 2015-01-08 DIAGNOSIS — I454 Nonspecific intraventricular block: Secondary | ICD-10-CM | POA: Diagnosis not present

## 2015-01-08 DIAGNOSIS — Z006 Encounter for examination for normal comparison and control in clinical research program: Secondary | ICD-10-CM | POA: Diagnosis not present

## 2015-01-09 DIAGNOSIS — R509 Fever, unspecified: Secondary | ICD-10-CM | POA: Diagnosis not present

## 2015-01-09 DIAGNOSIS — I454 Nonspecific intraventricular block: Secondary | ICD-10-CM | POA: Diagnosis not present

## 2015-01-09 DIAGNOSIS — Z951 Presence of aortocoronary bypass graft: Secondary | ICD-10-CM | POA: Diagnosis not present

## 2015-01-09 DIAGNOSIS — B349 Viral infection, unspecified: Secondary | ICD-10-CM | POA: Diagnosis present

## 2015-01-09 DIAGNOSIS — Z95810 Presence of automatic (implantable) cardiac defibrillator: Secondary | ICD-10-CM | POA: Diagnosis not present

## 2015-01-09 DIAGNOSIS — I2510 Atherosclerotic heart disease of native coronary artery without angina pectoris: Secondary | ICD-10-CM | POA: Diagnosis present

## 2015-01-09 DIAGNOSIS — F329 Major depressive disorder, single episode, unspecified: Secondary | ICD-10-CM | POA: Diagnosis present

## 2015-01-09 DIAGNOSIS — Z886 Allergy status to analgesic agent status: Secondary | ICD-10-CM | POA: Diagnosis not present

## 2015-01-09 DIAGNOSIS — I255 Ischemic cardiomyopathy: Secondary | ICD-10-CM | POA: Diagnosis present

## 2015-01-09 DIAGNOSIS — J069 Acute upper respiratory infection, unspecified: Secondary | ICD-10-CM | POA: Diagnosis not present

## 2015-01-09 DIAGNOSIS — J42 Unspecified chronic bronchitis: Secondary | ICD-10-CM | POA: Diagnosis not present

## 2015-01-09 DIAGNOSIS — I5022 Chronic systolic (congestive) heart failure: Secondary | ICD-10-CM | POA: Diagnosis not present

## 2015-01-09 DIAGNOSIS — K219 Gastro-esophageal reflux disease without esophagitis: Secondary | ICD-10-CM | POA: Diagnosis present

## 2015-01-09 DIAGNOSIS — Z85038 Personal history of other malignant neoplasm of large intestine: Secondary | ICD-10-CM | POA: Diagnosis not present

## 2015-01-09 DIAGNOSIS — I252 Old myocardial infarction: Secondary | ICD-10-CM | POA: Diagnosis not present

## 2015-01-09 DIAGNOSIS — Z006 Encounter for examination for normal comparison and control in clinical research program: Secondary | ICD-10-CM | POA: Diagnosis not present

## 2015-01-09 DIAGNOSIS — J441 Chronic obstructive pulmonary disease with (acute) exacerbation: Secondary | ICD-10-CM | POA: Diagnosis present

## 2015-01-09 DIAGNOSIS — I10 Essential (primary) hypertension: Secondary | ICD-10-CM | POA: Diagnosis present

## 2015-01-09 DIAGNOSIS — F17210 Nicotine dependence, cigarettes, uncomplicated: Secondary | ICD-10-CM | POA: Diagnosis present

## 2015-01-10 DIAGNOSIS — J42 Unspecified chronic bronchitis: Secondary | ICD-10-CM | POA: Diagnosis not present

## 2015-01-10 DIAGNOSIS — J069 Acute upper respiratory infection, unspecified: Secondary | ICD-10-CM | POA: Diagnosis not present

## 2015-01-10 DIAGNOSIS — I5022 Chronic systolic (congestive) heart failure: Secondary | ICD-10-CM | POA: Diagnosis not present

## 2015-01-10 DIAGNOSIS — Z006 Encounter for examination for normal comparison and control in clinical research program: Secondary | ICD-10-CM | POA: Diagnosis not present

## 2015-01-10 DIAGNOSIS — Z951 Presence of aortocoronary bypass graft: Secondary | ICD-10-CM | POA: Diagnosis not present

## 2015-01-10 DIAGNOSIS — I454 Nonspecific intraventricular block: Secondary | ICD-10-CM | POA: Diagnosis not present

## 2015-01-10 DIAGNOSIS — R509 Fever, unspecified: Secondary | ICD-10-CM | POA: Diagnosis not present

## 2015-01-10 DIAGNOSIS — J441 Chronic obstructive pulmonary disease with (acute) exacerbation: Secondary | ICD-10-CM | POA: Diagnosis not present

## 2015-01-29 DIAGNOSIS — Z95810 Presence of automatic (implantable) cardiac defibrillator: Secondary | ICD-10-CM | POA: Diagnosis not present

## 2015-01-29 DIAGNOSIS — I10 Essential (primary) hypertension: Secondary | ICD-10-CM | POA: Diagnosis not present

## 2015-01-29 DIAGNOSIS — I255 Ischemic cardiomyopathy: Secondary | ICD-10-CM | POA: Diagnosis not present

## 2015-01-29 DIAGNOSIS — T82120A Displacement of cardiac electrode, initial encounter: Secondary | ICD-10-CM | POA: Diagnosis not present

## 2015-01-29 DIAGNOSIS — I517 Cardiomegaly: Secondary | ICD-10-CM | POA: Diagnosis not present

## 2015-01-29 DIAGNOSIS — Z950 Presence of cardiac pacemaker: Secondary | ICD-10-CM | POA: Diagnosis not present

## 2015-01-29 DIAGNOSIS — E785 Hyperlipidemia, unspecified: Secondary | ICD-10-CM | POA: Diagnosis not present

## 2015-01-29 DIAGNOSIS — Y831 Surgical operation with implant of artificial internal device as the cause of abnormal reaction of the patient, or of later complication, without mention of misadventure at the time of the procedure: Secondary | ICD-10-CM | POA: Diagnosis not present

## 2015-01-29 DIAGNOSIS — T82110A Breakdown (mechanical) of cardiac electrode, initial encounter: Secondary | ICD-10-CM | POA: Diagnosis not present

## 2015-01-29 DIAGNOSIS — I5022 Chronic systolic (congestive) heart failure: Secondary | ICD-10-CM | POA: Diagnosis not present

## 2015-01-29 DIAGNOSIS — T829XXA Unspecified complication of cardiac and vascular prosthetic device, implant and graft, initial encounter: Secondary | ICD-10-CM | POA: Diagnosis not present

## 2015-01-29 DIAGNOSIS — R931 Abnormal findings on diagnostic imaging of heart and coronary circulation: Secondary | ICD-10-CM | POA: Diagnosis not present

## 2015-01-29 DIAGNOSIS — Z951 Presence of aortocoronary bypass graft: Secondary | ICD-10-CM | POA: Diagnosis not present

## 2015-01-30 DIAGNOSIS — Z951 Presence of aortocoronary bypass graft: Secondary | ICD-10-CM | POA: Diagnosis not present

## 2015-01-30 DIAGNOSIS — Z95810 Presence of automatic (implantable) cardiac defibrillator: Secondary | ICD-10-CM | POA: Diagnosis not present

## 2015-01-30 DIAGNOSIS — T82110A Breakdown (mechanical) of cardiac electrode, initial encounter: Secondary | ICD-10-CM | POA: Diagnosis not present

## 2015-01-31 DIAGNOSIS — Z95810 Presence of automatic (implantable) cardiac defibrillator: Secondary | ICD-10-CM | POA: Diagnosis not present

## 2015-01-31 DIAGNOSIS — Z951 Presence of aortocoronary bypass graft: Secondary | ICD-10-CM | POA: Diagnosis not present

## 2015-01-31 DIAGNOSIS — T82110A Breakdown (mechanical) of cardiac electrode, initial encounter: Secondary | ICD-10-CM | POA: Diagnosis not present

## 2015-02-01 ENCOUNTER — Telehealth: Payer: Self-pay | Admitting: Internal Medicine

## 2015-02-01 DIAGNOSIS — K219 Gastro-esophageal reflux disease without esophagitis: Secondary | ICD-10-CM | POA: Diagnosis present

## 2015-02-01 DIAGNOSIS — I5042 Chronic combined systolic (congestive) and diastolic (congestive) heart failure: Secondary | ICD-10-CM | POA: Diagnosis not present

## 2015-02-01 DIAGNOSIS — I10 Essential (primary) hypertension: Secondary | ICD-10-CM | POA: Diagnosis present

## 2015-02-01 DIAGNOSIS — I739 Peripheral vascular disease, unspecified: Secondary | ICD-10-CM | POA: Diagnosis present

## 2015-02-01 DIAGNOSIS — E785 Hyperlipidemia, unspecified: Secondary | ICD-10-CM | POA: Diagnosis present

## 2015-02-01 DIAGNOSIS — Z9049 Acquired absence of other specified parts of digestive tract: Secondary | ICD-10-CM | POA: Diagnosis present

## 2015-02-01 DIAGNOSIS — Z85038 Personal history of other malignant neoplasm of large intestine: Secondary | ICD-10-CM | POA: Diagnosis not present

## 2015-02-01 DIAGNOSIS — I5022 Chronic systolic (congestive) heart failure: Secondary | ICD-10-CM | POA: Diagnosis present

## 2015-02-01 DIAGNOSIS — I255 Ischemic cardiomyopathy: Secondary | ICD-10-CM | POA: Diagnosis not present

## 2015-02-01 DIAGNOSIS — I501 Left ventricular failure: Secondary | ICD-10-CM | POA: Diagnosis not present

## 2015-02-01 DIAGNOSIS — Z95810 Presence of automatic (implantable) cardiac defibrillator: Secondary | ICD-10-CM | POA: Diagnosis not present

## 2015-02-01 DIAGNOSIS — J439 Emphysema, unspecified: Secondary | ICD-10-CM | POA: Diagnosis not present

## 2015-02-01 DIAGNOSIS — F17210 Nicotine dependence, cigarettes, uncomplicated: Secondary | ICD-10-CM | POA: Diagnosis present

## 2015-02-01 DIAGNOSIS — T82120A Displacement of cardiac electrode, initial encounter: Secondary | ICD-10-CM | POA: Diagnosis present

## 2015-02-01 DIAGNOSIS — T82110A Breakdown (mechanical) of cardiac electrode, initial encounter: Secondary | ICD-10-CM | POA: Diagnosis not present

## 2015-02-01 DIAGNOSIS — Z006 Encounter for examination for normal comparison and control in clinical research program: Secondary | ICD-10-CM | POA: Diagnosis not present

## 2015-02-01 DIAGNOSIS — I252 Old myocardial infarction: Secondary | ICD-10-CM | POA: Diagnosis not present

## 2015-02-01 DIAGNOSIS — Z951 Presence of aortocoronary bypass graft: Secondary | ICD-10-CM | POA: Diagnosis not present

## 2015-02-01 DIAGNOSIS — I2510 Atherosclerotic heart disease of native coronary artery without angina pectoris: Secondary | ICD-10-CM | POA: Diagnosis present

## 2015-02-01 NOTE — Telephone Encounter (Signed)
Have faxed for notes

## 2015-02-01 NOTE — Telephone Encounter (Signed)
HFU for ICD placement. Pt scheduled on 4/1 at 2:30pm. Pt aware of appt.msn

## 2015-02-02 ENCOUNTER — Telehealth: Payer: Self-pay

## 2015-02-02 NOTE — Telephone Encounter (Signed)
The patient is being discharged from [facility] today (3/29) and has scheduled a follow up for a week from now.

## 2015-02-03 NOTE — Telephone Encounter (Signed)
Left vm for pt to return my call.  

## 2015-02-04 ENCOUNTER — Telehealth: Payer: Self-pay | Admitting: *Deleted

## 2015-02-04 NOTE — Telephone Encounter (Signed)
Discharge date: 02/02/15  Transition Care Management Follow-up Telephone Call  How have you been since you were released from the hospital? Pt states " fine"    Do you understand why you were in the hospital? YES   Do you understand the discharge instrcutions? YES   Items Reviewed:  Medications reviewed: NO  Allergies reviewed: NO  Dietary changes reviewed: NO  Referrals reviewed: NO   Functional Questionnaire:   Activities of Daily Living (ADLs):   She states they are independent in the following:  States they require assistance with the following:     Any transportation issues/concerns?: NO   Any patient concerns? NO   Confirmed importance and date/time of follow-up visits scheduled: YES 02/09/15 at 10:30 with [facility]    Confirmed with patient if condition begins to worsen call PCP or go to the ER.  Patient was given the Call-a-Nurse line [phone]: NO

## 2015-02-05 ENCOUNTER — Other Ambulatory Visit: Payer: Self-pay | Admitting: Internal Medicine

## 2015-02-05 ENCOUNTER — Ambulatory Visit: Payer: Medicare Other | Admitting: Internal Medicine

## 2015-02-09 ENCOUNTER — Encounter: Payer: Self-pay | Admitting: Nurse Practitioner

## 2015-02-09 ENCOUNTER — Ambulatory Visit (INDEPENDENT_AMBULATORY_CARE_PROVIDER_SITE_OTHER): Payer: Medicare Other | Admitting: Nurse Practitioner

## 2015-02-09 DIAGNOSIS — Z09 Encounter for follow-up examination after completed treatment for conditions other than malignant neoplasm: Secondary | ICD-10-CM | POA: Insufficient documentation

## 2015-02-09 DIAGNOSIS — I25700 Atherosclerosis of coronary artery bypass graft(s), unspecified, with unstable angina pectoris: Secondary | ICD-10-CM

## 2015-02-09 DIAGNOSIS — T82110A Breakdown (mechanical) of cardiac electrode, initial encounter: Secondary | ICD-10-CM | POA: Diagnosis not present

## 2015-02-09 LAB — BASIC METABOLIC PANEL
BUN: 14 mg/dL (ref 6–23)
CHLORIDE: 105 meq/L (ref 96–112)
CO2: 27 mEq/L (ref 19–32)
CREATININE: 0.84 mg/dL (ref 0.40–1.20)
Calcium: 9.4 mg/dL (ref 8.4–10.5)
GFR: 73.07 mL/min (ref >60.00)
Glucose, Bld: 110 mg/dL — ABNORMAL HIGH (ref 70–99)
Potassium: 4.5 mEq/L (ref 3.5–5.1)
SODIUM: 136 meq/L (ref 135–145)

## 2015-02-09 LAB — CBC WITH DIFFERENTIAL/PLATELET
BASOS PCT: 1.2 % (ref 0.0–3.0)
Basophils Absolute: 0.1 10*3/uL (ref 0.0–0.1)
EOS PCT: 10.4 % — AB (ref 0.0–5.0)
Eosinophils Absolute: 0.8 10*3/uL — ABNORMAL HIGH (ref 0.0–0.7)
HEMATOCRIT: 37.4 % (ref 36.0–46.0)
HEMOGLOBIN: 12.8 g/dL (ref 12.0–15.0)
LYMPHS ABS: 2 10*3/uL (ref 0.7–4.0)
Lymphocytes Relative: 25.3 % (ref 12.0–46.0)
MCHC: 34.2 g/dL (ref 30.0–36.0)
MCV: 90.6 fl (ref 78.0–100.0)
MONOS PCT: 8.6 % (ref 3.0–12.0)
Monocytes Absolute: 0.7 10*3/uL (ref 0.1–1.0)
NEUTROS ABS: 4.2 10*3/uL (ref 1.4–7.7)
Neutrophils Relative %: 54.5 % (ref 43.0–77.0)
PLATELETS: 285 10*3/uL (ref 150.0–400.0)
RBC: 4.13 Mil/uL (ref 3.87–5.11)
RDW: 14.4 % (ref 11.5–15.5)
WBC: 7.8 10*3/uL (ref 4.0–10.5)

## 2015-02-09 MED ORDER — ACETAMINOPHEN 500 MG PO TABS: ORAL_TABLET | ORAL | Status: DC

## 2015-02-09 NOTE — Progress Notes (Signed)
Pre visit review using our clinic review tool, if applicable. No additional management support is needed unless otherwise documented below in the visit note. 

## 2015-02-09 NOTE — Patient Instructions (Addendum)
Tylenol sent to your pharmacy.   Maximum 6 tablets daily.   Please visit the lab before leaving today.   Keep following instructions as per [facility] - no lifting of over 5 lbs with right arm and no lifting arm above device for 6 weeks.  Follow a low cholesterol- low fat diet.   Call us if fever, swelling of arms/legs, greater than 2 lb weight gain in 24 hours or 5 lbs in 1 week.   Work on stopping smoking.

## 2015-02-09 NOTE — Progress Notes (Signed)
Subjective:    Patient ID: Ashley [name], female    DOB: 26-Jun-1951, 64 y.o.   MRN: [id number]  HPI  Ashley [name] is a 64 yo female here for her transition to care management visit.   1) D/c 02/02/15 from [facility] for ICD lead replacement Diet recall 24 hrs-  Dinner- rice, beans, cube steak  Breakfast- Coffee with creamer    Denies swelling in arms, legs, stomach, no weight gain greater than 2 lbs or temp greater than 101   She reports she is not lifting her arm above shoulder level or lifting more than allotted.   Daily- 7/10 pain  Tylenol not helpful at low dose  8 cigarettes daily still. Wants to quit smoking.   Review of Systems  Constitutional: Negative for fever, chills, diaphoresis, fatigue and unexpected weight change.  Respiratory: Negative for chest tightness, shortness of breath and wheezing.   Cardiovascular: Negative for chest pain, palpitations and leg swelling.  Gastrointestinal: Negative for nausea, vomiting and diarrhea.  Skin: Negative for rash.  Neurological: Negative for dizziness, weakness, numbness and headaches.  Psychiatric/Behavioral: The patient is not nervous/anxious.    Past Medical History  Diagnosis Date  . GERD (gastroesophageal reflux disease)   . Hypertension   . Hyperlipidemia   . Depression   . Hx of colonic polyps   . Colon cancer   . CAD (coronary artery disease)     s/p CABG  . Mitral valve disorder     s/p mitral valve repair wth CABG    History   Social History  . Marital Status: Widowed    Spouse Name: N/A  . Number of Children: 0  . Years of Education: N/A   Occupational History  . Not on file.   Social History Main Topics  . Smoking status: Current Every Day Smoker    Types: Cigarettes  . Smokeless tobacco: Never Used     Comment: 1/2-1 ppd  . Alcohol Use: No  . Drug Use: No  . Sexual Activity: Not on file   Other Topics Concern  . Not on file   Social History Narrative    Past Surgical History    Procedure Laterality Date  . Total abdominal hysterectomy  1999    history of abnormal pap  . Cabg with mitral valve repair    . Colon surgery      colon cancer  . Arm surgery      fracture, has plates and screws    Family History  Problem Relation Age of Onset  . Arthritis Mother   . Cancer Mother     uterus cancer  . Hyperlipidemia Mother   . Hypertension Mother   . Heart disease Mother   . Diabetes Mother   . Hyperlipidemia Father   . Hypertension Father   . Heart disease Father   . Diabetes Father   . Cancer Sister     ovary cancer  . Diabetes Maternal Grandmother   . Hypertension Maternal Grandmother   . Arthritis Maternal Grandmother   . Hypertension Maternal Grandfather   . Hypertension Paternal Grandmother   . Hypertension Paternal Grandfather   . Heart disease Paternal Grandfather     Allergies  Allergen Reactions  . Hydrocodone-Acetaminophen Nausea Only    Current Outpatient Prescriptions on File Prior to Visit  Medication Sig Dispense Refill  . albuterol (PROVENTIL HFA;VENTOLIN HFA) 108 (90 BASE) MCG/ACT inhaler Inhale 2 puffs into the lungs every 6 (six) hours as needed for wheezing.    [name]  aspirin 81 MG tablet Take 81 mg by mouth daily.    [name] atorvastatin (LIPITOR) 40 MG tablet Take 1 tablet (40 mg total) by mouth daily. 90 tablet 3  . carvedilol (COREG) 6.25 MG tablet Take 1 tablet (6.25 mg total) by mouth 2 (two) times daily with a meal. 180 tablet 1  . clopidogrel (PLAVIX) 75 MG tablet Take 1 tablet (75 mg total) by mouth daily. 90 tablet 1  . diphenhydrAMINE (BENADRYL) 25 mg capsule Take 1 capsule (25 mg total) by mouth every 6 (six) hours as needed for itching. 30 capsule 0  . escitalopram (LEXAPRO) 10 MG tablet Take 1 tablet (10 mg total) by mouth daily. 90 tablet 1  . Fluticasone-Salmeterol (ADVAIR DISKUS) 250-50 MCG/DOSE AEPB Inhale 1 puff into the lungs every 12 (twelve) hours. 180 each 1  . furosemide (LASIX) 20 MG tablet Take 1 tablet (20 mg  total) by mouth daily. 90 tablet 1  . lisinopril (PRINIVIL,ZESTRIL) 20 MG tablet Take 1 tablet (20 mg total) by mouth daily. 90 tablet 1  . metroNIDAZOLE (METROCREAM) 0.75 % cream Apply 1 application topically 2 (two) times daily as needed. 60 g 3  . nitroGLYCERIN (NITROSTAT) 0.4 MG SL tablet Place 0.4 mg under the tongue every 5 (five) minutes as needed for chest pain.    [name] omeprazole (PRILOSEC) 40 MG capsule Take 1 capsule (40 mg total) by mouth daily. 90 capsule 1  . SPIRIVA RESPIMAT 2.5 MCG/ACT AERS INHALE TWO SPRAYS BY MOUTH ONCE DAILY 4 g 6  . Tdap (BOOSTRIX) 5-2.5-18.5 LF-MCG/0.5 injection Inject 0.5 mLs into the muscle once. 0.5 mL 0   No current facility-administered medications on file prior to visit.      Objective:   Physical Exam  Constitutional: She is oriented to person, place, and time. She appears well-developed and well-nourished. No distress.  BP 120/78 mmHg  Pulse 77  Temp(Src) 97.8 F (36.6 C) (Oral)  Resp 16  Ht 5' 4.5" (1.638 m)  Wt 162 lb 12.8 oz (73.846 kg)  BMI 27.52 kg/m2  SpO2 97%   HENT:  Head: Normocephalic and atraumatic.  Right Ear: External ear normal.  Left Ear: External ear normal.  Eyes: EOM are normal. Pupils are equal, round, and reactive to light. Right eye exhibits no discharge. Left eye exhibits no discharge. No scleral icterus.  Neck: Normal range of motion. Neck supple.  Cardiovascular: Normal rate, regular rhythm, normal heart sounds and intact distal pulses.  Exam reveals no gallop and no friction rub.   No murmur heard. Pulmonary/Chest: Effort normal and breath sounds normal. No respiratory distress. She has no wheezes. She has no rales. She exhibits no tenderness.  Lymphadenopathy:    She has no cervical adenopathy.  Neurological: She is alert and oriented to person, place, and time. No cranial nerve deficit. She exhibits normal muscle tone. Coordination normal.  Skin: Skin is warm and dry. No rash noted. She is not diaphoretic.  No  signs of infection around incision sites and steri strips still intact.   Psychiatric: She has a normal mood and affect. Her behavior is normal. Judgment and thought content normal.      Assessment & Plan:

## 2015-02-10 NOTE — Telephone Encounter (Signed)
Left message for patient to call office need medical release signed in order to obtain records from [facility].

## 2015-02-20 NOTE — Assessment & Plan Note (Signed)
Pt is doing well. She is still complaining of pain. Called in a stronger tylenol. Will not refill oxycodone. Asked her to call if this is not helpful. She was agreeable. Reinforced healthy diet recommendations from d/c teaching. Heart healthy, low sodium, and low fat diets.

## 2015-02-20 NOTE — Assessment & Plan Note (Signed)
Replaced at [facility]. D/c 02/02/15. TCM call 02/04/15. Visit 02/09/15. No signs of infection. Encouraged to let Steri strips fall off when ready. Obtain follow up BMET and CBC w/ diff as recommended by d/c notes. Will follow.

## 2015-02-22 NOTE — Addendum Note (Signed)
Addended by: Rubbie Battiest on: 02/22/2015 10:03 AM   Modules accepted: Level of Service

## 2015-02-26 NOTE — Discharge Summary (Signed)
PATIENT NAME:  Ashley [name], Ashley [name] MR#:  [id number] DATE OF BIRTH:  June 17, 1951  DATE OF ADMISSION:  05/29/2013 DATE OF DISCHARGE:  05/30/2013  PRIMARY CARE PHYSICIAN:  Dr. Derrel Nip.   PRIMARY CARDIOLOGIST:  Dr. Saralyn Pilar.   CONSULTATIONS IN THE HOSPITAL:  Cardiology consultation by Dr. Saralyn Pilar.   DISCHARGE DIAGNOSES:  1.  Unstable angina.  2.  Coronary artery disease, status post bypass graft surgery.  3.  Hypertension.  4.  History of colon cancer, status post hemicolectomy.  5.  Gastroesophageal reflux disease.  6.  Chronic obstructive pulmonary disease.   DISCHARGE HOME MEDICATIONS:  1.  Lisinopril 20 mg by mouth daily.  2.  Sublingual nitroglycerin 0.4 mg every 5 minutes as needed for chest pain.  3.  Lexapro 10 mg by mouth daily.  4.  Zocor 20 mg by mouth at bedtime.  5.  Aspirin 81 mg by mouth daily.  6.  Coreg 6.25 mg by mouth twice daily.  7.  Proventil inhaler 2 puffs q. 6 hours as needed.  8.  Advair 250/50 1 puff twice daily.  9.  Aclidinium 400 mcg 1 puff twice daily.  10.  Prilosec 20 mg by mouth twice daily.   DISCHARGE DIET:  Low-sodium diet.   DISCHARGE ACTIVITY:  As tolerated.    FOLLOW-UP INSTRUCTIONS:   1.  PCP follow-up in 3 weeks.  2.  Cardiology follow-up with Dr. Saralyn Pilar in 4 to 6 weeks.   LABS AND IMAGING STUDIES:  WBC is 8.5, hemoglobin 14.3, hematocrit 41.0, platelet count 244.   Sodium 141, potassium 3.4, chloride 106, bicarb 30, BUN 15, creatinine 1.06, glucose of 105 and calcium of 8.9.  LDL 107, HDL 35, triglycerides 381, total cholesterol 218.  Troponins negative in the hospital.  Chest x-ray showing clear lung fields.  No evidence of acute cardiopulmonary disease.   BRIEF HOSPITAL COURSE:  Ashley [name] is a 64 year old Caucasian female with past medical history significant for coronary artery disease status post bypass graft surgery in 2007 and has significant blockages in all the vessels, on medical management with chronic stable angina,  hypertension, COPD who was sent in from PCP's office secondary to chest pain.  1.  Chest pain, likely stable versus unstable angina.  The patient has frequent stable angina and she is on medical management for the same, follows with Dr. Saralyn Pilar.  She said she has run out of her medications and has not been taking any of her medications lately.  So she was admitted.  Troponins were negative.  She has not had any chest pain in the hospital.  She went for a stress test now and if the stress test is negative, the patient will be discharged home.  Also her home medications that she was on before including a statin, aspirin, Coreg and lisinopril are being restarted at this time.  Blood pressure is within normal limits while on these medications so no further changes are being made.  If she has chronic angina, maybe she could benefit from being on Imdur.  For now, sublingual nitrates being given to the patient.   2.  Her course has been otherwise uneventful in the hospital.  All prescriptions for all her home medications are being given at this time.   DISCHARGE CONDITION:  Stable.   DISCHARGE DISPOSITION:  Home.   Time spent on discharge is 40 minutes.    ____________________________ Gladstone Lighter, MD rk:ea D: 05/30/2013 15:42:03 ET T: 05/30/2013 22:32:53 ET JOB#: [id number]  cc: Gladstone Lighter, MD, <Dictator>  Isaias Cowman, MD Deborra Medina, MD Gladstone Lighter MD ELECTRONICALLY SIGNED 06/09/2013 15:10

## 2015-02-26 NOTE — H&P (Signed)
PATIENT NAME:  Ashley [name], Ashley [name] MR#:  [id number] DATE OF BIRTH:  1951-01-18  DATE OF ADMISSION:  05/29/2013  Addendum. This is a continuation.  ASSESSMENT AND PLAN: A 64 year old female with history of coronary artery disease, hypertension, off medications for over 3 weeks. She has a history of stable angina.  1.  Chest pain: The patient started having chest pain around midnight last night. Unclear if she was doing activity or if she was resting. She is really not good about explaining, but she was apparently pretty active overnight, so this could be an equivalent of her chronic angina, stable angina, although based on the changes on the EKG compared with earlier this morning until today, we are going to call it as unstable angina and start her on anticoagulation. We are going to order a stress test in the morning, as her cardiac enzymes are negative x 1, but we are going to recycle the cardiac enzymes every 8 hours x 3. If any of them are positive, we are going to suspend the stress test and call cardiology. The patient has had an echocardiogram with an ejection fraction that is slightly low in the upper 40s apparently as per patient. This was done in Dr. Saralyn Pilar office. We are going to request records, and Dr. Saralyn Pilar has been wanting to do a stress test. We are going to order a stress test for the morning. Continue aspirin. Continue beta blocker. Continue statin. Nitroglycerin as needed,  as the patient is not having any more pain, and morphine as needed. Monitor under telemetry and restart current medications the patient was off of.  2. Hypertension: The patient has presented with accelerated hypertension, blood pressures above [id number] on the systolic side. We are going to restart her on her home medications. The patient's blood pressure is starting to come down after starting lisinopril.  3.  Dyslipidemia: Continue statin.  4.  History of chronic obstructive pulmonary disease: Continue inhalers. At this  moment, there does not seem to be any type of exacerbation or flare. Monitor closely.  5.  Gastrointestinal prophylaxis: The patient has a history of diarrhea and gastroesophageal reflux disease. Start her on a proton pump inhibitor.  6.  The patient is a full code.   TIME SPENT: I spent about 45 minutes with this patient.    ____________________________ Sherrill Sink, MD rsg:jm D: 05/29/2013 18:11:09 ET T: 05/29/2013 18:52:51 ET JOB#: [id number]  cc: Hondo Sink, MD, <Dictator> Deborra Medina, MD Cristi Loron MD ELECTRONICALLY SIGNED 06/01/2013 0:34

## 2015-02-26 NOTE — H&P (Signed)
PATIENT NAME:  Ashley [name], Ashley [name] MR#:  [id number] DATE OF BIRTH:  21-Mar-1951  DATE OF ADMISSION:  05/29/2013  PRIMARY CARE PHYSICIAN: Deborra Medina, MD.  PRIMARY CARDIOLOGIST: Isaias Cowman, MD.   CHIEF COMPLAINT: Chest pain.   Brought from the clinic directly from Dr. Hulen Shouts office due to chest pain and elevated blood pressure.   REFERRING PHYSICIAN: Delman Kitten, MD.  HISTORY OF PRESENT ILLNESS: This is a very nice 64 year old female, who has history of coronary artery disease status post CABG, hypertension, severe COPD, who comes with a history of chest pain that started last night at midnight. Apparently, the patient has been off all her medications around for the past three weeks. She always had history of stable angina for which she takes her medications and the pain goes away. She has stable angina symptoms. This happened 3 or 4 times a month. At this time, the patient was at home at midnight resting and the pain started. The pain actually stayed there and did not go away until she was admitted over here and got nitroglycerin.   It seems like the duration of the pain has been over 12 hours to some degree on and off. Apparently, the patient had two types of chest pain happening within the last 12 hours. One last night started with piercing pain that lasted for 1 or 2 hours with intensity of 4 or 5 out of 10 with no radiation with location behind the sternum. Worse with movement or activity, not relieved by resting completely. The pain went up after an hour and remained with a dull pressure sensation. Apparently, the patient felt like there was an expanding bubble inside her chest and the intensity of that was around 1 to 2 out of 10. There was no radiation of the pain as mentioned above. She does have history of stable angina for what she had pain with exertion and usually her chest pain  travels down to the left lower extremity, but that did not happen last night.   The patient was  evaluated by Dr. Derrel Nip. She was there in followup to get back on her medications, but her blood pressure was also elevated to about 170s over 80s.   The patient comes here to the ER after seen by  Dr. Lupita Dawn office. She received nitroglycerin and the pain relieved completely.   Cardiac enzymes are negative. Most of the workup is negative. There were some changes on the EKG from previous EKGs. There was some ST depression on lead I but not on leads II or III, not on aVL, and there was some ST depression on V5 previously today that right now has  normalized.    REVIEW OF SYSTEMS: A 12-system review of systems is done. CONSTITUTIONAL: Denies any fever, fatigue, or significant weakness. No weight loss or weight gain.  EYES: No double vision, blurry vision or inflammation.  ENT: No swelling of sinuses. No difficulty swallowing. No hearing loss.  RESPIRATORY: No cough, wheezing, or hemoptysis at this moment. She has COPD but it has been stable.  CARDIOVASCULAR: Positive angina with pain in the middle of the chest and radiation to the left upper extremity. The patient had some chest pain today that did not relieve because she did not have nitroglycerin. The patient is a patient of Dr. Saralyn Pilar. She states that she has had  recent echocardiogram at Dr. Saralyn Pilar' office, and he has mentioned that he would like to get a stress test anyway because of borderline low ejection  fraction. No orthopnea. No syncope. No palpitations.  GASTROINTESTINAL: No nausea, vomiting, abdominal pain, constipation, or diarrhea.  GENITOURINARY: No dysuria, hematuria or changes in frequency.  GYNECOLOGIC: No breast masses.  ENDOCRINE: No polyuria, polydipsia, polyphagia, cold or heat intolerance.  HEMATOLOGIC AND LYMPHATIC: No anemia, easy bruising or swollen glands.  SKIN: No rashes, petechiae or moles.  MUSCULOSKELETAL: No significant neck pain, back pain or gout.  NEUROLOGIC: No numbness, tingling, or transient ischemic  attack.  PSYCHIATRIC: Negative for significant agitation. No major anxiety.   PAST MEDICAL HISTORY:  1.  COPD. 2.  Hypertension.  3.  Coronary artery disease, status post CABG.  4.  Mitral valve repair.  5.  History of colon cancer.  6.  Hyperlipidemia.  7.  Chronic diarrhea.   ALLERGIES: No known drug allergies.   PAST SURGICAL HISTORY:  1.  Hysterectomy.  2.  Coronary artery bypass graft.  3.  Mitral valve repair.  4.  History of hemicolectomy due to colon cancer.  5.  Surgery.   HOME MEDICATIONS: Zocor 20 mg daily, Proventil inhaler as needed for shortness of breath, Prilosec 20 mg 2 times a day, Nitrostat 0.4 mg as needed for chest pain, metronidazole topical  use, lisinopril 20 mg once a day, Lexapro 10 mg once a day, Coreg 6.25 mg once a day, aspirin 81 mg daily, Advair Diskus 250/50 mg 2 times a day, Aclidinium.   PHYSICAL EXAMINATION:  VITAL SIGNS: Blood pressure 201/108 at the highest right now 171/87, pulse 76, respirations 20, temperature afebrile. The patient is 100% on 2 liters of oxygen via nasal cannula.  GENERAL: The patient is alert, oriented x 3. No acute distress. No respiratory distress. Hemodynamically stable and asymptomatic at this moment.  HEENT: Pupils are equal and reactive. Extraocular movements are intact. Mucosa is moist. Anicteric sclerae. Pink conjunctivae. No oral lesions. No oropharyngeal exudates.  NECK: Supple. No JVD. No thyromegaly. No adenopathy. No carotid bruits. No rigidity.  CARDIOVASCULAR: Regular rate and rhythm. No murmurs, rubs or gallops.  LUNGS: Clear without any wheezing or crepitus. No use of accessory muscles. There is no tenderness to palpation on anterior chest wall. There is no displacement of PMI. Good air entrance.  ABDOMEN: Soft, nontender, nondistended. No hepatosplenomegaly. No masses. Bowel sounds are positive.  GENITAL: Deferred.  EXTREMITIES: No edema, cyanosis or clubbing. Pulses +2. Capillary refill less than 3.   NEUROLOGIC: Cranial nerves II through XII are intact. Strength is 5/5 in all 4 extremities.  PSYCHIATRIC: Negative for agitation. The patient is alert, oriented x 3.  LYMPHATIC: Negative for lymphadenopathy in the neck or supraclavicular areas.  SKIN: Without any rashes or petechiae.  MUSCULOSKELETAL: No joint effusions or joint edema.   RESULTS:  1.  EKG. As mentioned above, there is some ST depression on lead V1 and also V6. This has resolved after nitroglycerin.  2.  Chest x-ray: No significant signs of consolidation. No evidence of cardiopulmonary disease. 3.  Electrolytes are normal with a creatinine of 0.87. Sodium 138, potassium 3.7. LFTs not checked. HDL checked in April was 50 with an LDL of 111. At this moment, those are pending. 4.  Troponins are negative. UDS was negative in April. Hemoglobin is 15. White count is 10.  5.  INR is 1.    ____________________________ Manlius Sink, MD rsg:np D: 05/29/2013 18:03:00 ET T: 05/29/2013 18:48:20 ET JOB#: [id number]  cc: Winchester Sink, MD, <Dictator> Lavaughn Haberle America Brown MD ELECTRONICALLY SIGNED 06/01/2013 0:34

## 2015-02-26 NOTE — Consult Note (Signed)
PATIENT NAME:  CYENTHIA, SPEGAL MR#:  [id number] DATE OF BIRTH:  [birth year]  DATE OF CONSULTATION:  05/30/2013  CONSULTING PHYSICIAN:  Isaias Cowman, MD  PRIMARY CARE PHYSICIAN: Deborra Medina, MD   CHIEF COMPLAINT: Chest pain and elevated blood pressure.   REASON FOR CONSULTATION: Consultation requested for evaluation of chest pain.   HISTORY OF PRESENT ILLNESS: The patient is a 64 year old female with known history of coronary artery disease and mitral valve repair. The patient was recently seen as an outpatient, at which time she was complaining of intermittent episodes of chest discomfort, for which she takes sublingual nitroglycerin. The patient saw Dr. Einar Pheasant as an outpatient and was noted to be experiencing elevated blood pressure. The patient intermittently had not been taking her blood pressure medications. The patient was sent to [facility] Emergency Room and was admitted to telemetry where she ruled out for myocardial infarction by CPK isoenzymes and troponin. She underwent Lexiscan sestamibi study today, which revealed predominant inferior, septal and lateral scar without significant ischemia. The patient reports that she is feeling much better today, currently chest pain-free.   PAST MEDICAL HISTORY: 1.  Status post non-ST elevation myocardial infarction 11/14/2005.  2.  Status post CABG x 3 with LIMA to LAD, SVG to OM1 and SVG to PDA 11/21/2005 with mitral valve repair.  3.  Chronic stable angina. 4.  COPD.  5.  Ongoing tobacco abuse. 6.  Chronic systolic congestive heart failure.   MEDICATIONS: Carvedilol 6.25 mg b.i.d., lisinopril 20 mg daily, simvastatin 20 mg daily, aspirin 81 mg daily, nitroglycerin sublingual p.r.n., Lexapro 10 mg daily, simvastatin 20 mg daily, omeprazole 40 mg daily, albuterol inhaler 2 puffs q.4 hours p.r.n., Advair 250/50 one inhalation b.i.d.   SOCIAL HISTORY: The patient currently lives with her fiancee. She still smokes 1/2 pack of cigarettes a  day.   FAMILY HISTORY: Mother died status post MI at age 62. Father died status post MI at age 91.  REVIEW OF SYSTEMS:    CONSTITUTIONAL: No fever or chills.  EYES: No blurry vision.  EARS: No hearing loss.  RESPIRATORY: The patient has shortness of breath due to underlying COPD.  CARDIOVASCULAR: The patient has intermittent chest pain as described above.  GASTROINTESTINAL: No nausea, vomiting or diarrhea.  GENITOURINARY: No dysuria or hematuria.  ENDOCRINE: No polyuria or polydipsia.  MUSCULOSKELETAL: No arthralgias or myalgias.  NEUROLOGICAL: No focal muscle weakness or numbness.  PSYCHOLOGICAL: No depression or anxiety.   PHYSICAL EXAMINATION: VITAL SIGNS: Blood pressure 115/70, pulse 67, respirations 16, temperature 98, pulse oximetry 94%.  HEENT: Pupils equal and reactive to light and accommodation.  NECK: Supple without thyromegaly.  LUNGS: Decreased breath sounds at both bases.  HEART: Normal JVP. Normal PMI. Regular rate and rhythm. Normal S1, S2. No appreciable gallop, murmur or rub.  ABDOMEN: Soft, nontender. Pulses were intact bilaterally.  MUSCULOSKELETAL: Normal muscle tone.  NEUROLOGICAL: The patient is alert and oriented x 3. Motor and sensory both grossly intact.   IMPRESSION: A 64 year old female with known coronary artery disease, status post bypass graft surgery, with prior history of non-ST elevation myocardial infarction, who presents with chest pain with typical and atypical features. Has ruled out for myocardial infarction by CPK isoenzymes and troponin. EKG is nondiagnostic Lexiscan sestamibi study showed multiple perfusion defects, predominant scar with minimal ischemia.   RECOMMENDATIONS:  1.  Agree with overall current therapy.  2.  May discharge home without further cardiac diagnostics at this time.  3.  Follow up as an outpatient  next week. At that time, may discuss potential cardiac catheterization to further delineate underlying coronary anatomy.     ____________________________ Isaias Cowman, MD ap:jm D: 05/30/2013 17:38:10 ET T: 05/30/2013 20:04:02 ET JOB#: [id number]  cc: Isaias Cowman, MD, <Dictator> Isaias Cowman MD ELECTRONICALLY SIGNED 05/31/2013 10:11

## 2015-02-26 NOTE — Consult Note (Signed)
PATIENT NAME:  Ashley [name], Ashley [name] MR#:  [id number] DATE OF BIRTH:  12-Dec-1950  CARDIOLOGY CONSULTATION  DATE OF CONSULTATION:  02/25/2013  REFERRING PHYSICIAN:  Dr. Lunette Stands, with [facility].  CONSULTING PHYSICIAN:  Zamoria Boss D. Shailee Foots, MD  INDICATION: Chest pain.   CARDIOLOGIST: Dr. Saralyn Pilar.   PRIMARY PHYSICIAN: Dr. Deborra Medina.   HISTORY OF PRESENT ILLNESS: The patient is a 64 year old white female with a history of coronary disease, coronary bypass surgery, COPD, noncompliance, smoking who states that she came to the Emergency Room with symptoms of shortness of breath and some chest pain. It hurt to breathe. She had dyspnea. She has had chest pressure radiating to the left side of her neck. No diaphoresis or lightheadedness. Bypass surgery was in 2007.   She was seen in the Emergency Room. Cardiac enzymes were unremarkable. The patient had some depressive symptoms as well, but denied suicidal ideations. She continues to smoke, and again had some worsening shortness of breath and dyspnea so she was admitted for further evaluation and care.   REVIEW OF SYSTEMS: No blackout spells or syncope. No nausea or vomiting. No fever. No chills. No sweats. No weight loss. No weight gain. No hemoptysis, hematemesis. No bright red blood per rectum. No vision change or hearing change. Denies sputum production or cough.   PAST MEDICAL HISTORY: COPD, hypertension, history of diarrhea, chronic; history of smoking, coronary artery disease.   PAST SURGICAL HISTORY: Hysterectomy, coronary artery bypass surgery, colon cancer, arm  surgery.   FAMILY HISTORY: Coronary artery disease, diabetes.   SOCIAL HISTORY: Smoker, unemployed. No alcohol consumption, illicit drugs.  MEDICATIONS: She was on none when she came to the hospital.   ALLERGIES: None.   PHYSICAL EXAMINATION: VITAL SIGNS: Blood pressure was 150/80, pulse of 80, respiratory rate of 16, afebrile.  HEENT: Normocephalic, atraumatic. Pupils equal  and reactive to light.  NECK: Supple. No significant JVD, bruits, or adenopathy.  LUNGS: Clear to auscultation and percussion. Positive rhonchi bilaterally. No significant rales. No wheezing. Adequate air movement.  HEART EXAM: Regular rhythm.  Systolic ejection murmur, left sternal border. Positive S4. PMI nondisplaced.  ABDOMINAL EXAM: Benign.  EXTREMITIES: Within normal limits.  NEUROLOGIC EXAM: Intact.  SKIN EXAM: Normal.   LABORATORIES: CBC normal. Troponins were negative. TSH normal. CBC and MET B, were normal.   EKG: Normal sinus rhythm, nonspecific ST-T wave changes. Rate of 80.   ASSESSMENT:  Unstable angina, coronary artery disease, hypertension, smoking, chronic obstructive pulmonary disease, depression, atrial fibrillation initially, now in sinus.   PLAN: Agree with admit. Rule out for myocardial infarction. Continue chronic anticoagulation,  aspirin therapy. Reinstitute medication with statins, beta blocker, ACE inhibitor, with aspirin. Follow up EKGs. Follow up enzymes. Hopefully the patient will rule out for a myocardial infarction, then will consider whether functional study or cardiac cath is necessary. Advised the patient to quit smoking and continue bronchodilators to help. The patient has COPD and may need aggressive therapy. Again, quitting smoking is paramount. Inhalers, not necessarily requiring steroids at this time, or antibiotics. If she improves quickly, then will treat the patient medically.   For depression, will consider counseling. Maybe start Zoloft.   Atrial fibrillation appears to be rate-controlled. She appears to be back in sinus. Will treat the patient medically for now.   Abnormal EKG: Possibly related to her atrial fibrillation and prior coronary artery disease. We will try to increase patient's activity, wean anticoagulants. If the patient has recurrent symptoms will consider going directly to cardiac cath. Will try to help  the patient with medication  because of her current financial situation. Will discuss the case with Dr. Saralyn Pilar.     ____________________________ Loran Senters. Clayborn Bigness, MD ddc:dm D: 02/25/2013 09:46:05 ET T: 02/25/2013 10:20:40 ET JOB#: [id number]  cc: Jaquari Reckner D. Clayborn Bigness, MD, <Dictator> Yolonda Kida MD ELECTRONICALLY SIGNED 03/25/2013 14:21

## 2015-02-26 NOTE — Discharge Summary (Signed)
PATIENT NAME:  Ashley [name], Ashley [name] MR#:  [id number] DATE OF BIRTH:  05-07-1951  DATE OF ADMISSION:  02/24/2013  DATE OF DISCHARGE:  02/25/2013  DISCHARGE DIAGNOSES: 1.  Atypical chest pain, with negative cardiac enzymes and Cardiology recommending outpatient cardiology followup, as patient was symptom-free.  2.  Questionable desire to end her life. Patient denied having any suicidal ideation, and feels that she was taken out of context and she was never suicidal or homicidal. She was stressed out, as  she had a hearing on 23rd of April for disability.  3.  Hyperlipidemia, diet controlled for now. Recommend exercise. 4.  Smoking cessation. Counseled for about 3 minutes. She is not ready to quit yet. Denies any need for nicotine replacement therapy.   SECONDARY DIAGNOSES: 1.  Chronic obstructive pulmonary disease.  2.  Hypertension.  3.  Chronic diarrhea.  4.  Coronary artery disease, status post coronary artery bypass graft.   CONSULTATION: Cardiology, Dr. Clayborn Bigness.   PROCEDURES/RADIOLOGY: Chest x-ray on 28th of April showed no evidence of acute cardiopulmonary disease. Cardiomegaly seen.   HISTORY AND SHORT HOSPITAL COURSE: The patient is a 64 year old female with the above-mentioned medical problems, who was admitted for atypical chest pain, thought to be more musculoskeletal and/or stress in nature, as patient had a disability hearing on 23rd of April. She was admitted on telemetry floor, was ruled out with 3 negative sets of cardiac enzymes. She had a normal TSH. She did have hypomagnesemia, which was repleted and resolved. She was found to have hypokalemia also, which was also replaced. She had hyperlipidemia, for which she was recommended cholesterol medication, although she wanted to continue diet control and exercise, which was recommended. A urine toxicology screen was negative. She was evaluated by Cardiology, who recommended outpatient followup with her regular cardiology doctor,  with  which she was in agreement. Her chest pain was resolved, and she was discharged home in stable condition.   VITAL SIGNS: On the date of discharge, her vital signs were as follows: Temperature 97.8, heart rate 69 per minute, respirations 20 per minute, blood pressure 114/74 mmHg, she was saturating 95% on room air.   PERTINENT PHYSICAL EXAMINATION ON THE DAY OF DISCHARGE:  CARDIOVASCULAR:  S1, S2 normal. No murmurs, rubs or gallops.  LUNGS:  Clear to auscultation bilaterally. No wheezing, rales, rhonchi or crepitation.  ABDOMEN: Soft, benign.  NEUROLOGIC: Nonfocal examination.  All other physical examination remained at baseline.   DISCHARGE MEDICATIONS: 1.  Lisinopril 20 mg p.o. daily.  2.  Coreg 6.25 mg p.o. b.i.d.   DISCHARGE DIET:  Low sodium, low fat, low cholesterol,   DISCHARGE ACTIVITY: As tolerated.   DISCHARGE INSTRUCTIONS AND FOLLOWUP:  The patient was instructed to follow up with her primary care physician, Dr. Deborra Medina, in 1 to 2 weeks. She will need followup with Dr. Saralyn Pilar from [facility] in 1 to 2 weeks.   Total time discharging this patient was 55 minutes.     ____________________________ Lucina Mellow. Manuella Ghazi, MD vss:mr D: 03/01/2013 16:06:56 ET T: 03/01/2013 22:36:11 ET JOB#: [id number]  cc: Avary Eichenberger S. Manuella Ghazi, MD, <Dictator> Deborra Medina, MD Isaias Cowman, MD Dwayne D. Clayborn Bigness, MD    Remer Macho MD ELECTRONICALLY SIGNED 03/03/2013 11:32

## 2015-02-26 NOTE — H&P (Signed)
PATIENT NAME:  Ashley [name], Ashley [name] MR#:  [id number] DATE OF BIRTH:  1951/03/24  PRIMARY CARE PHYISICIAN: Dr. Deborra Medina  REFERRING PHYSICIAN: Dr. Arman Filter   CHIEF COMPLAINT: Chest pain.   HISTORY OF PRESENT ILLNESS: The patient is a 64 year old white female with past medical history of coronary artery disease status post CABG in 2007 who comes to the Emergency Department with complaints of chest pain. The patient states she has been having on and off chest pain for the last few months. The patient was living in [place] where the patient had a stress test done in September  the results of which the patient does not know. The patient had a stress test done at [facility] in [place]. The patient was told that the patient will need a left heart cath; however, the patient left before receiving any treatment. The patient states the pain is on the left side of the chest, pressure-like pain radiating into the left neck. The patient states she has mild shortness of breath. No diaphoresis, lightheadedness. Currently denies mild improvement of the pain. Work-up in the Emergency Department with EKG and cardiac enzymes was unremarkable. While the patient was in the Emergency Department, the patient stated to the nurse in the Emergency Department that life was not worth living; however, did not have any plan.   PAST MEDICAL HISTORY: 1. Chronic obstructive pulmonary disease.  2. Hypertension.  3. Chronic diarrhea after the patient was started on Dexilant.  4. Chronic obstructive pulmonary disease, status post bronchodilators.  5. Coronary artery disease status post CABG. Follows up with Dr. Saralyn Pilar.  PAST SURGICAL HISTORY: 1. Hysterectomy.  2. Coronary artery bypass surgery.  3. History of colon cancer, status post hemicolectomy.  4. Arm surgery.   ALLERGIES: No known drug allergies.   HOME MEDICATIONS: The patient does not have any home medications.   SOCIAL HISTORY: Smokes 1 pack a day.  Denies drinking alcohol or using illicit drugs.   FAMILY HISTORY: History of coronary artery disease, diabetes mellitus.   REVIEW OF SYSTEMS:  CONSTITUTIONAL: No generalized weakness weight loss.  EYES: No scleral icterus. No change in vision.  ENT: No change in hearing or sore throat.  LUNGS: No cough or shortness of shortness of breath.  CARDIOVASCULAR: Has chest pain. No pedal edema.  SKIN: No rash or lesions.  GENITOURINARY: No dysuria, polyuria.  MUSCULOSKELETAL: Has good range of motion; however, the patient is poorly mobile.  NEURO: The patient does not have any weakness, numbness in any part of the body.   PHYSICAL EXAMINATION: GENERAL: This event well-built, well-nourished, age-appropriate female lying down in the bed, not in distress.  VITAL SIGNS: Temperature 99, pulse 82, blood pressure 144/83, respiratory rate of 16, oxygen saturation is 96% on room air.  HEENT: Head normocephalic, atraumatic. There is no scleral icterus. Conjunctivae normal. Pupils equal and react to light. Extraocular movements are intact. Mucous membranes moist. No pharyngeal erythema.  NECK: Supple. No lymphadenopathy, no JVD.  CHEST: Has focal tenderness on the left side of the chest radiating to the neck. Bilaterally clear to auscultation.  HEART: S1 and S2 regular. No murmurs are heard.  ABDOMEN: Bowel sounds present. Soft, nontender, nondistended. No hepatosplenomegaly.  EXTREMITIES: No pedal edema. Pulses 2+.  NEUROLOGIC: The patient is alert, oriented to place, person and time. Cranial nerves II through XII intact. No motor and sensory deficits.   LABORATORY DATA: CBC, complete metabolic panel are completely within normal limits. Troponins are negative. TSH 0.21.   EKG  12-lead: Normal sinus rhythm with no ST-T wave abnormalities.   ASSESSMENT AND PLAN: The patient is a 64 year old female that comes to the Emergency Department with complaints of chest pain.  1. Chest pain: We will continue to  monitor on the monitors. This seems to be more of a musculoskeletal pain; however, we will rule out with cardiac enzymes x3. Will consider getting medical records from [facility]. If the patient's stress test is concerning, the patient may benefit from going for left heart cath.  2. Atrial fibrillation: Rate is well controlled.  3. Depression and suicidal ideation: We will a sitter at bedside. We will obtain psychiatric consult in the morning.  4. Keep the patient on deep vein thrombosis prophylaxis with Lovenox.   TIME SPENT: 45 minutes.    ____________________________ Monica Becton, MD pv:aw D: 02/24/2013 03:27:33 ET T: 02/24/2013 07:04:55 ET JOB#: [id number]  cc: Monica Becton, MD, <Dictator> Monica Becton MD ELECTRONICALLY SIGNED 02/26/2013 8:37

## 2015-02-27 NOTE — H&P (Signed)
PATIENT NAME:  LOVEDA, Ashley [name] MR#:  [id number] DATE OF BIRTH:  1951-02-22  DATE OF ADMISSION:  08/19/2014  PRIMARY CARE PHYSICIAN: Deborra Medina, MD   CARDIOLOGIST:  Isaias Cowman, MD  HISTORY OF PRESENT ILLNESS: The patient is [name] 64 year old Caucasian female with past medical history significant for history of unstable angina, chronic stable angina, also history of coronary artery disease, status post coronary artery bypass grafting in 2007 with multiple occluded vessels, medical therapy was recommended, history of hypertension, history of gastroesophageal reflux disease as well as COPD who is not on oxygen at home presented to the hospital with complaints of sudden onset of shortness of breath. Apparently the patient was doing well up until 4 or 5 weeks ago when she started having spells of shortness of breath. She would have sudden onset of lung wheezing as well as shortness of breath as well as some chest discomfort and today she was so uncomfortable that she called EMS, she was about to pass out. On EMS arrival the patient's blood pressure was found to be 180/120 and she was brought to the Emergency Room for further evaluation. She required CPAP and here in the Emergency Room BiPAP for oxygenation. Now she is diuresed since her exam, physical exam revealed congestive heart failure and she feels much better. She is off oxygen completely and her blood pressure seemed to be good. She admits of having some discomfort in the chest, however, denies any significant pains.   PAST MEDICAL HISTORY: Significant for history of admission for [name] left clavicle fracture, status post ORIF of left clavicle fracture in May 2015 by Dr. Rudene Christians, history of admission in July 2014 for unstable angina, history of coronary artery disease, status post coronary artery bypass grafting in 2007, history of hypertension, colon cancer, status post hemicolectomy, gastroesophageal reflux disease as well as COPD,  mitral valve  repair, hyperlipidemia, and chronic diarrhea.   ALLERGIES: No known drug allergies.   PAST SURGICAL HISTORY: Hysterectomy, coronary artery bypass grafting, mitral valve repair, history of hemicolectomy due to colon cancer.   MEDICATIONS: The patient's medication list is as follows: Acetaminophen and oxycodone  325/5 one-2 tablets every 6 hours as needed, aclidinium inhalation powder 1 puff twice daily, Advair Diskus 250/50 one puff twice daily, aspirin 81 mg p.o. daily, Coreg 6.25 mg p.o. twice daily, Lexapro 10 mg p.o. daily, lisinopril 20 mg p.o. daily, Nitrostat 0.4 mg sublingually every 5 minutes as needed, Prilosec 40 mg p.o. daily, Proventil 2 puffs every 6 hours as needed, Zocor 10 mg p.o. daily.    SOCIAL HISTORY:  The patient used to smoke 1 pack [name] day but has been smoking now half pack [name] day. She has been smoking for 40 years. No alcohol or illicit drugs.   FAMILY HISTORY: Coronary artery disease as well as diabetes mellitus.   REVIEW OF SYSTEMS:  CONSTITUTIONAL: Positive for fatigue and weakness, pains in the chest, seasonal allergies, sinus congestion, cough, shortness of breath as well as different consistency phlegm, sometimes it is foamy, frothy, sometimes yellow and thick, shortness of breath, chest pains, orthopnea. She has been having chronic orthopnea since 2007, 4-pillow orthopnea, seems to be worsening over the past 4 or 5 weeks, arrhythmias, also feeling presyncopal. Denies any high fevers or chills, weight loss or gain.  EYES: Denies any blurry vision, double vision, glaucoma or cataracts.  ENT: Denies any tinnitus, epistaxis, sinus pain, dentures, difficulty swallowing.   RESPIRATORY: Denies any wheezes, asthma. Admits of COPD.   CARDIOVASCULAR: Denies  any edema, lower extremity edema or upper extremity edema. Denies any significant palpitations although intermittently would have arrhythmias.  GASTROINTESTINAL: Denies any nausea, vomiting, diarrhea, constipation.   GENITOURINARY: Denies dysuria, hematuria, frequency, incontinence.  ENDOCRINE: Denies any polydipsia, nocturia, thyroid problems, heat or cold intolerance or thirst.  HEMATOLOGIC: Denies anemia, easy bruising, bleeding, swollen glands.  SKIN: Denies any acne, rashes change in moles.  MUSCULOSKELETAL: Denies arthritis, cramps, swelling.  NEUROLOGIC: Denies numbness, epilepsy or tremor.  PSYCHIATRIC: Denies anxiety, insomnia, depression.   PHYSICAL EXAMINATION:  VITAL SIGNS: On arrival to the hospital, the patient's vital signs:  Temperature was 98.1, pulse 110, respiration was 30, blood pressure 184/118, saturation was 93% on oxygen therapy.  GENERAL: The patient is [name] well-developed, well-nourished Caucasian female in no significant distress sitting up on the stretcher.  HEENT: Her pupils are equal, reactive to light. Extraocular muscles intact. No icterus or conjunctivitis. Has normal hearing. No pharyngeal erythema. Mucosa is moist.  NECK: No masses, supple, nontender. Thyroid is not enlarged. No adenopathy. No JVD or carotid bruit, full range of motion.  LUNGS: Clear to auscultation. [name] few rhonchi were heard bilaterally at the bases and somewhat diminished breath sounds at the bases; otherwise, no wheezing, no labored inspiration, dullness  to percussion, overt respiratory distress at present although according to the Emergency Room physician the patient did have some crackles at bases.  CARDIOVASCULAR: S1, S2 appreciated. Rhythm is regular.  PMI not lateralized.   CHEST: Nontender to palpation.  EXTREMITIES: One plus pedal pulses. No lower extremity edema, calf tenderness or cyanosis was noted.  ABDOMEN: Soft, nontender. Bowel sounds are present. No hepatosplenomegaly or masses were noted.  RECTAL: Deferred.  MUSCLE STRENGTH: Able to move all extremities. No cyanosis, degenerative joint disease or kyphosis noted. Gait not tested.   SKIN: Did not reveal any rashes, lesions, erythema,  nodularity or induration. It was warm and dry to palpation.  LYMPHATIC: No adenopathy in the cervical region.  NEUROLOGIC: Cranial nerves grossly intact. Sensory is intact. No dysarthria or aphasia. The patient is alert and oriented to time, person and place, cooperative. Memory is good. No significant confusion, agitation, depression noted.   DIAGNOSTIC DATA:  EKG showed sinus tach at 102 beats per minute, normal axis, possible left atrial enlargement, nonspecific intraventricular block with QRS duration 126 milliseconds and nonspecific T wave abnormality,  T depressions in high lateral leads although no EKG for comparison was found on the computer.   LABORATORY DATA: BMP showed [name] glucose of 238, CO2 level of 20, otherwise, BMP was unremarkable. The patient's beta-type natriuretic peptide was 2379, albumin level of 3.3, alkaline phosphatase 122, AST 83, and ALT was 65. The patient's troponin was 0.03. TSH was 2.61. White blood cell count was normal at 9.6, hemoglobin was 13.2, platelet count was 306 with absolute neutrophil count normal at 6.1. Urinalysis  50 mg/dL glucose, negative for bilirubin or ketones, specific gravity 1.009, pH was 5.0, negative for blood, 30 mg/dL protein, negative for nitrites or leukocyte esterase, 1 red blood cell, 1 white blood cell, no bacteria was seen, less than 1 epithelial cell and mucus was seen. Chest x-ray 08/18/2014 showed mild pulmonary edema and worsening bibasilar opacities, atelectasis versus infiltrate. Follow-up chest x-ray in 4 to 6 weeks after treatment was recommended to ensure resolution.   ASSESSMENT AND PLAN:  1.  Acute pulmonary edema due to acute on chronic questionable diastolic congestive heart failure. Admit the patient to the medical floor. Continue her on Lasix as well as  ACE inhibitors. We will get cardiologist involved. We will continue oxygen therapy as needed.  2.  Chest pain, questionable acute ischemia. We will check cardiac enzymes x3. We  will get cardiologist as mentioned above. We will continue the patient on Coreg, aspirin, nitroglycerin and heparin subcutaneously.  3.  Elevated transaminases. The  patient is asymptomatic. We will follow. The patient's  transaminases level was not checked in the past. The patient's AST is elevated more than the ALT and we will follow the patient's symptoms.  4.  Malignant essential hypertension. We will continue the patient on ACE inhibitor as well as Coreg we will follow blood pressure readings with diuresis. The patient was advised about salt intake.  5.  Tobacco abuse, counseling. Nicotine replacement therapy will be initiated. This was discussed, cessation, for approximately 4 minutes. She was agreeable.   TIME SPENT: 50 minutes.   ____________________________ Theodoro Grist, MD rv:AT D: 08/19/2014 01:43:25 ET T: 08/19/2014 03:08:49 ET JOB#: [id number]  cc: Theodoro Grist, MD, <Dictator> Isaias Cowman, MD Deborra Medina, MD [place] MD ELECTRONICALLY SIGNED 09/23/2014 22:30

## 2015-02-27 NOTE — Op Note (Signed)
PATIENT NAME:  Ashley [name], Ashley [name] MR#:  [id number] DATE OF BIRTH:  01-26-1951  DATE OF PROCEDURE:  03/17/2014  PREOPERATIVE DIAGNOSIS: Comminuted displaced left clavicle fracture.   POSTOPERATIVE DIAGNOSIS: Comminuted displaced left clavicle fracture.   PROCEDURE: Open reduction internal fixation, left clavicle.   ANESTHESIA: General.   SURGEON: Hessie Knows, MD   DESCRIPTION OF PROCEDURE: The patient was brought to the operating room and after adequate anesthesia was obtained, the patient was placed in a beach chair position with a bump underneath between the shoulder blades to aid in reduction. The left arm was prepped and draped in the usual sterile fashion with the left arm held in a spider attachment.  C-arm was brought in and good visualization could be obtained prior to prepping and draping. After completing appropriate patient identification and timeout procedures, incision was made in line with Langer's line over the fracture site. The skin and subcutaneous tissue was spread and the bone exposed. Anterior soft tissues were stripped to allow for application of a plate. There was a butterfly fragment which was reduced and held in place with reduction clamps. Near anatomic reduction was obtained. An anterior clavicle plate from [employer] was chosen, 9 holes. This was placed anteriorly and held in place with a K wire, with the reduction held and checking on C-arm with appropriate position of the plate appeared to have been obtained, compression screws were placed at each end of the plate to get the plates down to the bone. Then, multiple locking screws were inserted drilling, measuring, and placing the locking screws. At the distal end, the previously placed cortical screw was replaced with a locking screw for additional pullout strength.  The C-arm was brought in and oblique views were obtained. There appeared to be appropriate position of the fracture with near-anatomic alignment. Hardware appeared  to be in appropriate position with good cortical fixation obtained along the plate. At this point, the wound was thoroughly irrigated. The wound was closed with #1 Vicryl to repair the muscle over the plate to minimize irritation by the plate. Subcutaneous tissue was approximated using 2-0 Vicryl and the skin was closed with staples. 30 mL of 0.25% Sensorcaine without epinephrine was infiltrated in the subcutaneous tissue to aid in postoperative analgesia. Sterile dressing of Xeroform, 4 x 4's, ABD, and OpSite were applied. A sling was placed and the patient was sent to the recovery room in stable condition where postoperative x-ray showed near anatomic alignment.   ESTIMATED BLOOD LOSS: 25 mL.   COMPLICATIONS: None.   SPECIMEN: None.   IMPLANT: [employer] anterior locking clavicle plate.    ____________________________ Laurene Footman, MD mjm:dd D: 03/17/2014 20:02:25 ET T: 03/17/2014 20:14:42 ET JOB#: [id number]  cc: Laurene Footman, MD, <Dictator> Laurene Footman MD ELECTRONICALLY SIGNED 03/18/2014 8:15

## 2015-02-27 NOTE — Discharge Summary (Signed)
Dates of Admission and Diagnosis:  Date of Admission 19-Aug-2014   Date of Discharge 21-Aug-2014   Admitting Diagnosis Acute Non ST elevation Myocardial infarction.   Final Diagnosis Acute Non ST elevation MI Ac systolic CHF Hypertension Smoking    Chief Complaint/History of Present Illness a 64 year old Caucasian female with past medical history significant for history of unstable angina, chronic stable angina, also history of coronary artery disease, status post coronary artery bypass grafting in 2007 with multiple occluded vessels, medical therapy was recommended, history of hypertension, history of gastroesophageal reflux disease as well as COPD who is not on oxygen at home presented to the hospital with complaints of sudden onset of shortness of breath. Apparently the patient was doing well up until 4 or 5 weeks ago when she started having spells of shortness of breath. She would have sudden onset of lung wheezing as well as shortness of breath as well as some chest discomfort and today she was so uncomfortable that she called EMS, she was about to pass out. On EMS arrival the patient's blood pressure was found to be 180/120 and she was brought to the Emergency Room for further evaluation. She required CPAP and here in the Emergency Room BiPAP for oxygenation. Now she is diuresed since her exam, physical exam revealed congestive heart failure and she feels much better. She is off oxygen completely and her blood pressure seemed to be good. She admits of having some discomfort in the chest, however, denies any significant pains.   Allergies:  No Known Allergies:   Pertinent Past History:  Pertinent Past History a left clavicle fracture, status post ORIF of left clavicle fracture in May 2015 by Dr. Rudene Christians, history of admission in July 2014 for unstable angina, history of coronary artery disease, status post coronary artery bypass grafting in 2007, history of hypertension, colon cancer, status  post hemicolectomy, gastroesophageal reflux disease as well as COPD, mitral valve repair, hyperlipidemia, and chronic diarrhea.   Hospital Course:  Hospital Course 64 yo female w/ hx of HTN, Hyperlipidemia, COPD, Depression, GERD came into hospital due to shortness of breath, Chest pain.    1.  Shortness of breath -  ac systolic CHF- low EF per Cath report.  - clinically improved  no wheezing/ copd. - cont. Lasix for CHF and follow I's and O's and daily weights.  Appreciated Cards input.     2. NSTEMI - does have risk factors for CAD given HTN, Hyperlipidemia, tobacco abuse.  - Cath done- Tripple vessel dz- Suggested medical management. follow in office with cardiology.  3. Headache - likely related to Nitro paste.  - PRN Ibuprofen and monitor.   4. Malignant HtN - BP improved.  - cont. Coreg, Lisinopril. PRN hydralazine and monitor.   5. Depression - cont. Lexapro.   6. Abnormal LFT's - ?? related to Statin.   follow as out pt.  7. GERD - cont. Protonix.   8. Nicotine dependence - cont. Nicotrol inhaler. d/c home today.   Condition on Discharge Guarded   Code Status:  Code Status Full Code   DISCHARGE INSTRUCTIONS HOME MEDS:  Medication Reconciliation: Patient's Home Medications at Discharge:     Medication Instructions  lisinopril 20 mg oral tablet  1 tab(s) orally once a day   nitrostat 0.4 mg sublingual tablet  1 tab(s) sublingual every 5 minutes, As Needed - for Chest Pain    lexapro 10 mg oral tablet  1 tab(s) orally once a day   zocor 20 mg  oral tablet  1 tab(s) orally once a day (at bedtime)   aspirin 81 mg oral tablet  1 tab(s) orally once a day   carvedilol 6.25 mg oral tablet  1 tab(s) orally 2 times a day   proventil hfa cfc free 90 mcg/inh inhalation aerosol  2 puff(s) inhaled every 6 hours, As Needed   advair diskus 250 mcg-50 mcg inhalation powder  1 puff(s) inhaled 2 times a day   aclidinium 400 mcg/inh inhalation powder  1 puff(s) inhaled 2 times a day    prilosec 40 mg oral delayed release capsule  1 cap(s) orally once a day   clopidogrel 75 mg oral tablet  1 tab(s) orally once a day   furosemide 20 mg oral tablet  1 tab(s) orally once a day     Physician's Instructions:  Diet Low Sodium  Low Fat, Low Cholesterol   Activity Limitations As tolerated   Return to Work Not Applicable   Time frame for Follow Up Appointment 1-2 weeks  Cardiology clinic     Paraschos, Alexander(Consultant): [facility], [address], [place], [place] [address], [place]   Derrel Nip, Teresa(Family Physician): [facility], [address] [place], [place] [address], [place]  Electronic Signatures: [name] (MD)  (Signed 21-Oct-15 13:51)  Authored: ADMISSION DATE AND DIAGNOSIS, CHIEF COMPLAINT/HPI, Allergies, PERTINENT PAST HISTORY, HOSPITAL COURSE, [place], PATIENT INSTRUCTIONS, Follow Up Physician   Last Updated: 21-Oct-15 13:51 by [name] (MD)

## 2015-02-27 NOTE — Consult Note (Signed)
PATIENT NAME:  Ashley [name], Ashley [name] MR#:  [id number] DATE OF BIRTH:  July 22, 1951  DATE OF CONSULTATION:  08/19/2014  REFERRING PHYSICIAN:   CONSULTING PHYSICIAN:  Isaias Cowman, MD  PRIMARY CARE PHYSICIAN: Deborra Medina, MD   CARDIOLOGIST: Isaias Cowman, MD   HISTORY OF PRESENT ILLNESS: The patient is a 64 year old female who presents with shortness of breath, chest discomfort, and elevated blood pressure. The patient has known coronary artery disease, status post CABG with mitral valve repair and moderate cardiomyopathy with chronic systolic congestive heart failure. The patient reports a 3 to 4 week history of generally not feeling well, increasing shortness of breath who presents to [facility] Emergency Room with acute shortness of breath, wheezing, and chest tightness. Initial blood pressure was 180/20. EKG was nondiagnostic. The patient was admitted with acute on chronic systolic congestive heart failure and pulmonary edema. The patient was treated with initial diuresis with clinical improvement. Today, the patient reports feeling much better. Initial troponin is negative.   PAST MEDICAL HISTORY: 1.  Status post non-STEMI 11/14/2005.  2.  Status post CABG x 3 with mitral valve repair 11/21/2005.  3.  Moderate cardiomyopathy.  4.  Chronic systolic congestive heart failure.  5.  Hypertension.  6.  Hyperlipidemia.  7.  COPD.   MEDICATIONS ON ADMISSION: Carvedilol 6.25 mg b.i.d., lisinopril 20 mg daily, simvastatin 20 mg daily, aspirin 81 mg daily, Lexapro 10 mg daily, omeprazole 40 mg daily, albuterol inhaler 2 puffs q. 4 hours p.r.n., Advair Diskus 1 puff p.r.n.   SOCIAL HISTORY: The patient is married, resides with her husband. Smokes 1/2 pack of cigarettes a day.   FAMILY HISTORY: Mother died status post MI at age 51. Father died status post MI at age 83. Brother age 62, status post coronary stents.   REVIEW OF SYSTEMS: CONSTITUTIONAL: No fever or chills.  EYES: No blurry vision.   EARS: No hearing loss.  RESPIRATORY: Shortness of breath as described above.  CARDIOVASCULAR: Chest tightness as described above.  GASTROINTESTINAL: No nausea, vomiting, or diarrhea.  GENITOURINARY: No dysuria or hematuria.  ENDOCRINE: No polyuria or polydipsia.  MUSCULOSKELETAL: No arthralgias or myalgias.  NEUROLOGICAL: No focal muscle weakness or numbness.  PSYCHOLOGICAL: No depression or anxiety.   PHYSICAL EXAMINATION:  VITAL SIGNS: Blood pressure 169/101, pulse 74, respirations 17, temperature 97.9, pulse oximetry 94%.  HEENT: Pupils equal, reactive to light and accommodation.  NECK: Supple without thyromegaly.  LUNGS: Reveal a few scattered wheezes.  CARDIOVASCULAR: Normal JVP. Diffuse PMI. Regular rate and rhythm. Normal S1, S2. Grade 1/6 systolic murmur.  ABDOMEN: Soft and nontender. Pulses were intact bilaterally.  MUSCULOSKELETAL: Normal muscle tone.  NEUROLOGIC: The patient is alert and oriented x 3. Motor and sensory both grossly intact.   IMPRESSION: A 64 year old female with known history of coronary artery disease, status post coronary artery bypass graft, moderate cardiomyopathy, history of chronic systolic congestive heart failure, who presents with shortness of breath and elevated blood pressure, probable acute on chronic systolic congestive heart failure with an element of diastolic congestive heart failure in light of elevated blood pressure with negative initial troponin. The patient has shown clinical improvement after initial diuresis.   RECOMMENDATIONS: 1.  Agree with overall current therapy.  2.  Continue diuresis.  3.  Cycle cardiac enzymes.  4.  Consider repeat echocardiogram if not done in the past 6 months.  5.  Further recommendations pending the patient's initial clinical course and follow up cardiac isoenzyme measurements.    ____________________________ Isaias Cowman, MD ap:at  D: 08/19/2014 09:05:52 ET T: 08/19/2014 12:21:14  ET JOB#: [id number]  cc: Isaias Cowman, MD, <Dictator> Isaias Cowman MD ELECTRONICALLY SIGNED 08/19/2014 12:55

## 2015-03-03 NOTE — Op Note (Signed)
PATIENT NAME:  Ashley [name], Ashley [name] MR#:  [id number] DATE OF BIRTH:  1950/11/15  DATE OF PROCEDURE:  10/27/2014 M PREOPERATIVE DIAGNOSES:  1. Atherosclerotic occlusive disease, bilateral lower extremities with lifestyle limiting claudication of the right lower extremity.  2. 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   POSTOPERATIVE DIAGNOSES:  1. Atherosclerotic occlusive disease, bilateral lower extremities with lifestyle limiting claudication of the right lower extremity.  2. 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   PROCEDURES PERFORMED: 1. Abdominal aortogram.  2. Bilateral lower extremity distal runoff.  3. Percutaneous transluminal angioplasty and stent placement, right common iliac artery.  4. Percutaneous transluminal angioplasty and stent placement, left common iliac artery.   SURGEON: Katha Cabal, MD   SEDATION Versed plus fentanyl. Continuous ECG, pulse oximetry, and cardiopulmonary monitoring is performed throughout the entire procedure by the interventional radiology nurse.   TOTAL SEDATION TIME: 1 hour 20 minutes.   ACCESS:  1. A 6 French sheath, right common femoral artery, retrograde.  2. A 6 French sheath, left common femoral artery, retrograde.   CONTRAST USED: Isovue 90 mL.   FLUOROSCOPY TIME: 4.7 minutes.   INDICATIONS: Ms. Imwalle is a 64 year old woman who presents with severe leg pain on the right. She also attests to lifestyle limitation because of her inability to walk. The risks and benefits for angiography and intervention were reviewed. All questions answered. The patient agrees to proceed.   DESCRIPTION OF PROCEDURE: The patient is taken to special procedures and placed in the supine position. After adequate sedation is achieved, both groins are prepped and draped in a sterile fashion. Lidocaine 1% is infiltrated in the soft tissues overlying the right common femoral artery, and the ultrasound is placed  in a sterile sleeve. The common femoral artery is identified as echolucent and slightly pulsatile, indicating patency. Images recorded for the permanent record and under real-time visualization, a micropuncture needle is inserted, Microwire followed by MicroSheath, J-wire followed by a 5 French sheath. The wire is then negotiated into the aorta and a pigtail catheter is advanced. The pigtail catheter is positioned at the level of T12, and AP projection of the aorta is obtained. String sign is noted in the right common iliac at its origin. The pigtail catheter is repositioned, and oblique view of the pelvis is obtained.   Ultrasound is then utilized to examine the left common femoral, which is echolucent and pulsatile, indicating patency. Image is recorded for the permanent record. Micropuncture needle was inserted after 1% lidocaine is infiltrated, and subsequently, the Microwire and MicroSheath followed by a 6 [place] sheath. Magic Torque wire is advanced up to the left side. The Magic Torque wire is then introduced through the pigtail catheter, and the 5 French sheath on the right is upsized to a 6 [place] sheath.   Magnified image of the aortic bifurcation is obtained and two 7 x 39 Omnilink stents are advanced up each sheath and positioned so that they extend into the aorta by approximately 1 cm to an area of more normal luminal diameter. Both are inflated simultaneously in a kissing balloon technique to 12 atmospheres for approximately 30 seconds. Subsequently, the 7 mm balloon was removed on the right, and an 8 x 4 balloon is advanced up the right and inflated at the same time as the 7 balloon on the left. Then, the balloons are exchanged so that the 8 is now on the left, fully expanding both the right and left iliac stents to  8 mm.   The pigtail catheter is then advanced up the right, and AP projection is obtained with  distal runoff. After review of the images, oblique views of the groins were obtained  and StarClose devices are deployed. First the left, then the right. Both are successful. There are no immediate complications.   INTERPRETATION: The abdominal aorta is opacified with a bolus of contrast. There is diffuse disease with  moderate to severe narrowing in the distal aorta. There is a string sign in the right common iliac, beginning at its origin and extending for 10-15 mm. The left side demonstrates diffuse disease of the common iliac with a luminal diameter change of approximately 40% to 50%. In order to adequately treat the right lesion, the aortic bifurcation will have to be raised, bilateral kissing stents are required.   After deploying the stents and post dilating both the right and left to 8 mm, a distal runoff is then obtained, demonstrating the stents in the common to external iliacs are widely patent. The bilateral femoral and profunda femoris, as well as SFA and popliteals are widely patent. There is diffuse disease noted in the tibial vessels bilaterally, but there are no hemodynamically significant lesions. Of note, the anterior tibial on the right has an aberrant takeoff from the mid popliteal.    SUMMARY: Successful recanalization of the iliac arteries as described above.   ____________________________ Katha Cabal, MD ggs:mw D: 10/27/2014 16:08:56 ET T: 10/27/2014 16:45:50 ET JOB#: [id number]  cc: Katha Cabal, MD, <Dictator> Katha Cabal MD ELECTRONICALLY SIGNED 11/17/2014 17:31

## 2015-04-09 ENCOUNTER — Other Ambulatory Visit: Payer: Medicare Other

## 2015-04-12 ENCOUNTER — Encounter: Payer: Medicare Other | Admitting: Internal Medicine

## 2015-05-06 DIAGNOSIS — Z4502 Encounter for adjustment and management of automatic implantable cardiac defibrillator: Secondary | ICD-10-CM | POA: Diagnosis not present

## 2015-05-06 DIAGNOSIS — I5022 Chronic systolic (congestive) heart failure: Secondary | ICD-10-CM | POA: Diagnosis not present

## 2015-05-11 ENCOUNTER — Telehealth: Payer: Self-pay | Admitting: Internal Medicine

## 2015-05-11 ENCOUNTER — Ambulatory Visit (INDEPENDENT_AMBULATORY_CARE_PROVIDER_SITE_OTHER): Payer: Medicare Other | Admitting: Internal Medicine

## 2015-05-11 DIAGNOSIS — I10 Essential (primary) hypertension: Secondary | ICD-10-CM

## 2015-05-11 NOTE — Telephone Encounter (Signed)
Please see below note and advise

## 2015-05-11 NOTE — Telephone Encounter (Signed)
She will have to be charged.  She took up a 30 minute slot and did not call on Friday as is our policy for cancellations

## 2015-05-11 NOTE — Progress Notes (Signed)
Subjective:  Patient ID: Ashley [name], female    DOB: 1951-06-03  Age: 64 y.o. MRN: [id number]  CC: There were no encounter diagnoses.  HPI SHANEIKA FILIPIAK failed to keep her appointment for hypertension. hyperlipidemia  Outpatient Prescriptions Prior to Visit  Medication Sig Dispense Refill  . acetaminophen (TYLENOL) 500 MG tablet Maximum 6 tablets a day. 42 tablet 0  . albuterol (PROVENTIL HFA;VENTOLIN HFA) 108 (90 BASE) MCG/ACT inhaler Inhale 2 puffs into the lungs every 6 (six) hours as needed for wheezing.    [name] aspirin 81 MG tablet Take 81 mg by mouth daily.    [name] atorvastatin (LIPITOR) 40 MG tablet Take 1 tablet (40 mg total) by mouth daily. 90 tablet 3  . carvedilol (COREG) 6.25 MG tablet Take 1 tablet (6.25 mg total) by mouth 2 (two) times daily with a meal. 180 tablet 1  . clopidogrel (PLAVIX) 75 MG tablet Take 1 tablet (75 mg total) by mouth daily. 90 tablet 1  . diphenhydrAMINE (BENADRYL) 25 mg capsule Take 1 capsule (25 mg total) by mouth every 6 (six) hours as needed for itching. 30 capsule 0  . escitalopram (LEXAPRO) 10 MG tablet Take 1 tablet (10 mg total) by mouth daily. 90 tablet 1  . Fluticasone-Salmeterol (ADVAIR DISKUS) 250-50 MCG/DOSE AEPB Inhale 1 puff into the lungs every 12 (twelve) hours. 180 each 1  . furosemide (LASIX) 20 MG tablet Take 1 tablet (20 mg total) by mouth daily. 90 tablet 1  . lisinopril (PRINIVIL,ZESTRIL) 20 MG tablet Take 1 tablet (20 mg total) by mouth daily. 90 tablet 1  . metroNIDAZOLE (METROCREAM) 0.75 % cream Apply 1 application topically 2 (two) times daily as needed. 60 g 3  . nitroGLYCERIN (NITROSTAT) 0.4 MG SL tablet Place 0.4 mg under the tongue every 5 (five) minutes as needed for chest pain.    [name] omeprazole (PRILOSEC) 40 MG capsule Take 1 capsule (40 mg total) by mouth daily. 90 capsule 1  . SPIRIVA RESPIMAT 2.5 MCG/ACT AERS INHALE TWO SPRAYS BY MOUTH ONCE DAILY 4 g 6  . Tdap (BOOSTRIX) 5-2.5-18.5 LF-MCG/0.5 injection Inject 0.5  mLs into the muscle once. 0.5 mL 0   No facility-administered medications prior to visit.    Review of Systems;  Patient denies headache, fevers, malaise, unintentional weight loss, skin rash, eye pain, sinus congestion and sinus pain, sore throat, dysphagia,  hemoptysis , cough, dyspnea, wheezing, chest pain, palpitations, orthopnea, edema, abdominal pain, nausea, melena, diarrhea, constipation, flank pain, dysuria, hematuria, urinary  Frequency, nocturia, numbness, tingling, seizures,  Focal weakness, Loss of consciousness,  Tremor, insomnia, depression, anxiety, and suicidal ideation.      Objective:  There were no vitals taken for this visit.  BP Readings from Last 3 Encounters:  02/09/15 120/78  10/21/14 120/76  10/09/14 120/78    Wt Readings from Last 3 Encounters:  02/09/15 162 lb 12.8 oz (73.846 kg)  10/21/14 159 lb (72.122 kg)  10/09/14 150 lb 8 oz (68.266 kg)    General appearance: alert, cooperative and appears stated age Ears: normal TM's and external ear canals both ears Throat: lips, mucosa, and tongue normal; teeth and gums normal Neck: no adenopathy, no carotid bruit, supple, symmetrical, trachea midline and thyroid not enlarged, symmetric, no tenderness/mass/nodules Back: symmetric, no curvature. ROM normal. No CVA tenderness. Lungs: clear to auscultation bilaterally Heart: regular rate and rhythm, S1, S2 normal, no murmur, click, rub or gallop Abdomen: soft, non-tender; bowel sounds normal; no masses,  no organomegaly Pulses: 2+  and symmetric Skin: Skin color, texture, turgor normal. No rashes or lesions Lymph nodes: Cervical, supraclavicular, and axillary nodes normal.  No results found for: HGBA1C  Lab Results  Component Value Date   CREATININE 0.84 02/09/2015   CREATININE 0.98 10/27/2014   CREATININE 0.85 10/09/2014    Lab Results  Component Value Date   WBC 7.8 02/09/2015   HGB 12.8 02/09/2015   HCT 37.4 02/09/2015   PLT 285.0 02/09/2015    GLUCOSE 110* 02/09/2015   CHOL 185 10/09/2014   TRIG 261* 10/09/2014   HDL 43 10/09/2014   LDLDIRECT 128* 10/09/2014   LDLCALC 90 10/09/2014   ALT 15 10/09/2014   AST 15 10/09/2014   NA 136 02/09/2015   K 4.5 02/09/2015   CL 105 02/09/2015   CREATININE 0.84 02/09/2015   BUN 14 02/09/2015   CO2 27 02/09/2015   INR 1.1 08/19/2014    No results found.  Assessment & Plan:   Problem List Items Addressed This Visit    None      I am having Ms. Aldava maintain her aspirin, nitroGLYCERIN, albuterol, diphenhydrAMINE, carvedilol, clopidogrel, escitalopram, Fluticasone-Salmeterol, furosemide, lisinopril, omeprazole, metroNIDAZOLE, Tdap, atorvastatin, SPIRIVA RESPIMAT, and acetaminophen.  No orders of the defined types were placed in this encounter.    There are no discontinued medications.  Follow-up: No Follow-up on file.   [name], MD

## 2015-05-11 NOTE — Telephone Encounter (Signed)
Pt called to cancel appt for today due to still being out of town. Please advise to cancel off schedule/msn

## 2015-05-11 NOTE — Assessment & Plan Note (Signed)
Patient failed to keep scheduled appointment and will be charged a no show fee.   

## 2015-06-09 ENCOUNTER — Other Ambulatory Visit: Payer: Self-pay | Admitting: Internal Medicine

## 2015-06-10 ENCOUNTER — Other Ambulatory Visit: Payer: Self-pay

## 2015-06-30 ENCOUNTER — Other Ambulatory Visit: Payer: Self-pay | Admitting: Internal Medicine

## 2015-07-01 ENCOUNTER — Other Ambulatory Visit: Payer: Self-pay | Admitting: *Deleted

## 2015-07-01 MED ORDER — LISINOPRIL 20 MG PO TABS: 20.0000 mg | ORAL_TABLET | Freq: Every day | ORAL | Status: DC

## 2015-07-01 MED ORDER — OMEPRAZOLE 40 MG PO CPDR: 40.0000 mg | DELAYED_RELEASE_CAPSULE | Freq: Every day | ORAL | Status: DC

## 2015-07-01 MED ORDER — FUROSEMIDE 20 MG PO TABS: 20.0000 mg | ORAL_TABLET | Freq: Every day | ORAL | Status: DC

## 2015-07-01 MED ORDER — CLOPIDOGREL BISULFATE 75 MG PO TABS: 75.0000 mg | ORAL_TABLET | Freq: Every day | ORAL | Status: DC

## 2015-07-01 MED ORDER — CARVEDILOL 6.25 MG PO TABS: 6.2500 mg | ORAL_TABLET | Freq: Two times a day (BID) | ORAL | Status: DC

## 2015-08-08 ENCOUNTER — Emergency Department
Admission: EM | Admit: 2015-08-08 | Discharge: 2015-08-08 | Disposition: A | Discharge status: Home / Self Care | Payer: Medicare Other | Attending: Emergency Medicine | Admitting: Emergency Medicine

## 2015-08-08 ENCOUNTER — Encounter: Payer: Self-pay | Admitting: Emergency Medicine

## 2015-08-08 ENCOUNTER — Emergency Department: Payer: Medicare Other

## 2015-08-08 DIAGNOSIS — F10920 Alcohol use, unspecified with intoxication, uncomplicated: Secondary | ICD-10-CM

## 2015-08-08 DIAGNOSIS — Z79899 Other long term (current) drug therapy: Secondary | ICD-10-CM | POA: Insufficient documentation

## 2015-08-08 DIAGNOSIS — Z720 Tobacco use: Secondary | ICD-10-CM | POA: Diagnosis not present

## 2015-08-08 DIAGNOSIS — Z7951 Long term (current) use of inhaled steroids: Secondary | ICD-10-CM | POA: Insufficient documentation

## 2015-08-08 DIAGNOSIS — I10 Essential (primary) hypertension: Secondary | ICD-10-CM | POA: Insufficient documentation

## 2015-08-08 DIAGNOSIS — F10120 Alcohol abuse with intoxication, uncomplicated: Secondary | ICD-10-CM | POA: Insufficient documentation

## 2015-08-08 DIAGNOSIS — J441 Chronic obstructive pulmonary disease with (acute) exacerbation: Secondary | ICD-10-CM | POA: Diagnosis not present

## 2015-08-08 DIAGNOSIS — Z7982 Long term (current) use of aspirin: Secondary | ICD-10-CM | POA: Insufficient documentation

## 2015-08-08 DIAGNOSIS — R079 Chest pain, unspecified: Secondary | ICD-10-CM | POA: Diagnosis not present

## 2015-08-08 DIAGNOSIS — J449 Chronic obstructive pulmonary disease, unspecified: Secondary | ICD-10-CM | POA: Diagnosis not present

## 2015-08-08 LAB — COMPREHENSIVE METABOLIC PANEL
ALBUMIN: 3.9 g/dL (ref 3.5–5.0)
ALT: 19 U/L (ref 14–54)
AST: 18 U/L (ref 15–41)
Alkaline Phosphatase: 74 U/L (ref 38–126)
Anion gap: 10 (ref 5–15)
BILIRUBIN TOTAL: 0.4 mg/dL (ref 0.3–1.2)
BUN: 13 mg/dL (ref 6–20)
CO2: 22 mmol/L (ref 22–32)
CREATININE: 0.83 mg/dL (ref 0.44–1.00)
Calcium: 9.1 mg/dL (ref 8.9–10.3)
Chloride: 110 mmol/L (ref 101–111)
GFR calc Af Amer: >60 mL/min (ref >60)
GFR calc non Af Amer: >60 mL/min (ref >60)
GLUCOSE: 145 mg/dL — AB (ref 65–99)
POTASSIUM: 3.4 mmol/L — AB (ref 3.5–5.1)
Sodium: 142 mmol/L (ref 135–145)
TOTAL PROTEIN: 7.1 g/dL (ref 6.5–8.1)

## 2015-08-08 LAB — CBC
HEMATOCRIT: 40.6 % (ref 35.0–47.0)
HEMOGLOBIN: 14.1 g/dL (ref 12.0–16.0)
MCH: 31.1 pg (ref 26.0–34.0)
MCHC: 34.8 g/dL (ref 32.0–36.0)
MCV: 89.6 fL (ref 80.0–100.0)
PLATELETS: 236 10*3/uL (ref 150–440)
RBC: 4.53 MIL/uL (ref 3.80–5.20)
RDW: 15.5 % — ABNORMAL HIGH (ref 11.5–14.5)
WBC: 9.5 10*3/uL (ref 3.6–11.0)

## 2015-08-08 LAB — TROPONIN I
Troponin I: <0.03 ng/mL (ref <0.031)
Troponin I: <0.03 ng/mL (ref <0.031)

## 2015-08-08 LAB — ETHANOL: Alcohol, Ethyl (B): 221 mg/dL — ABNORMAL HIGH (ref <5)

## 2015-08-08 MED ORDER — GI COCKTAIL ~~LOC~~
30.0000 mL | Freq: Once | ORAL | Status: AC
  Administered 2015-08-08: 30 mL via ORAL
  Filled 2015-08-08: qty 30

## 2015-08-08 MED ORDER — ONDANSETRON HCL 4 MG/2ML IJ SOLN
4.0000 mg | Freq: Once | INTRAMUSCULAR | Status: AC
  Administered 2015-08-08: 4 mg via INTRAVENOUS
  Filled 2015-08-08: qty 2

## 2015-08-08 NOTE — ED Notes (Signed)
Pt denies needs at this time. Pt with nsr on cardiac monitor, resps unlabored.

## 2015-08-08 NOTE — ED Notes (Signed)
Pt presents to ED via EMS from personal home with c/o of chest pain. EMS states pt experienced presenting sx this evening beginning approximately at 9 PM. EMS states pain is located in substernal region, with radiating to another site. EMS states pt self-administered x3 nitroglycerin sublingual tablets, without relief. EMS administered x4 81 mg aspirin and 4 mg of Zofran in route to ER. 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 Pt arrived to ER alert and oriented x4. Pt states she has been consuming ETOH this evening.

## 2015-08-08 NOTE — ED Provider Notes (Signed)
[facility] Emergency Department Provider Note  ____________________________________________  Time seen: Approximately 0051 AM  I have reviewed the triage vital signs and the nursing notes.   HISTORY  Chief Complaint Chest Pain    HPI Ashley [name] is a 64 y.o. female who comes into the hospital today with some chest pain and shortness of breath. The patient reports that everyone in the house for gout and told her that she needed to come in to be evaluated. The patient reports that the pain started between 9:30 and 10:30. 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 She reports that the pain became so intense she felt as though she couldn't breathe. She feels as if the pain was coming up from her stomach. She reports that this is not the typical chest pressure and pain that she had which is usually accompanied with were shortness of breath in which she had tonight. The patient did 3 nitroglycerin and reports that it did not help the pain so she called EMS. The patient reports the pain currently as a 7 out of 10 in intensity. The patient denies any vomiting but has had some nausea and sweats. The patient reports that she was drinking tonight to celebrate her sister's birthday. The patient denies any radiation of her pain denies any dizziness denies any headache or blurred vision.   Past Medical History  Diagnosis Date  . GERD (gastroesophageal reflux disease)   . Hypertension   . Hyperlipidemia   . Depression   . Hx of colonic polyps   . Colon cancer ([place])   . CAD (coronary artery disease)     s/p CABG  . Mitral valve disorder     s/p mitral valve repair wth CABG    Patient Active Problem List   Diagnosis Date Noted  . 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 Hospital discharge follow-up 02/09/2015  . Chronic cough 10/21/2014  . Tobacco abuse  10/11/2014  . Tobacco abuse counseling 10/11/2014  . Chronic right hip pain 08/26/2014  . Diarrhea 08/25/2014  . Fracture of clavicular shaft, closed 05/10/2014  . COPD (chronic obstructive pulmonary disease) ([place]) 06/18/2013  . Atherosclerosis of native arteries of the extremities with intermittent claudication 06/18/2013  . Routine general medical examination at a health care facility 06/18/2013  . Left-sided chest wall pain 06/18/2013  . CAD (coronary artery disease) 06/01/2013  . GERD (gastroesophageal reflux disease) 06/01/2013  . Essential hypertension, benign 06/01/2013  . Hypercholesterolemia 06/01/2013  . Tubular adenoma of colon 06/01/2013  . Depression 06/01/2013    Past Surgical History  Procedure Laterality Date  . Total abdominal hysterectomy  1999    history of abnormal pap  . Cabg with mitral valve repair    . Colon surgery      colon cancer  . Arm surgery      fracture, has plates and screws    Current Outpatient Rx  Name  Route  Sig  Dispense  Refill  . acetaminophen (TYLENOL) 500 MG tablet      Maximum 6 tablets a day.   42 tablet   0   . albuterol (PROVENTIL HFA;VENTOLIN HFA) 108 (90 BASE) MCG/ACT inhaler   Inhalation   Inhale 2 puffs into the lungs every 6 (six) hours as needed for wheezing.         [name] aspirin 81 MG tablet   Oral   Take 81 mg by  mouth daily.         [name] atorvastatin (LIPITOR) 40 MG tablet   Oral   Take 1 tablet (40 mg total) by mouth daily.   90 tablet   3     Replaces simvastatin, fill when simvastatin refill ...   . carvedilol (COREG) 6.25 MG tablet   Oral   Take 1 tablet (6.25 mg total) by mouth 2 (two) times daily with a meal.   180 tablet   1   . clopidogrel (PLAVIX) 75 MG tablet   Oral   Take 1 tablet (75 mg total) by mouth daily.   90 tablet   1   . escitalopram (LEXAPRO) 10 MG tablet      TAKE ONE (1) TABLET BY MOUTH EVERY DAY   90 tablet   1   . Fluticasone-Salmeterol (ADVAIR DISKUS) 250-50 MCG/DOSE  AEPB   Inhalation   Inhale 1 puff into the lungs every 12 (twelve) hours.   180 each   1   . furosemide (LASIX) 20 MG tablet   Oral   Take 1 tablet (20 mg total) by mouth daily.   90 tablet   1   . lisinopril (PRINIVIL,ZESTRIL) 20 MG tablet   Oral   Take 1 tablet (20 mg total) by mouth daily.   90 tablet   1   . nitroGLYCERIN (NITROSTAT) 0.4 MG SL tablet   Sublingual   Place 0.4 mg under the tongue every 5 (five) minutes as needed for chest pain.         [name] omeprazole (PRILOSEC) 40 MG capsule   Oral   Take 1 capsule (40 mg total) by mouth daily.   90 capsule   1   . SPIRIVA RESPIMAT 2.5 MCG/ACT AERS      INHALE TWO SPRAYS BY MOUTH ONCE DAILY   4 g   6     Allergies Hydrocodone-acetaminophen  Family History  Problem Relation Age of Onset  . Arthritis Mother   . Cancer Mother     uterus cancer  . Hyperlipidemia Mother   . Hypertension Mother   . Heart disease Mother   . Diabetes Mother   . Hyperlipidemia Father   . Hypertension Father   . Heart disease Father   . Diabetes Father   . Cancer Sister     ovary cancer  . Diabetes Maternal Grandmother   . Hypertension Maternal Grandmother   . Arthritis Maternal Grandmother   . Hypertension Maternal Grandfather   . Hypertension Paternal Grandmother   . Hypertension Paternal Grandfather   . Heart disease Paternal Grandfather     Social History Social History  Substance Use Topics  . Smoking status: Current Every Day Smoker    Types: Cigarettes  . Smokeless tobacco: Never Used     Comment: 1/2-1 ppd  . Alcohol Use: 1.2 oz/week    2 Shots of liquor per week     Comment: Occasional    Review of Systems Constitutional: No fever/chills Eyes: No visual changes. ENT: No sore throat. Cardiovascular: chest pain. Respiratory: shortness of breath. Gastrointestinal: Nausea No abdominal pain. no vomiting.  No diarrhea.  No constipation. Genitourinary: Negative for dysuria. Musculoskeletal: Negative for  back pain. Skin: Negative for rash. Neurological: Negative for headaches, focal weakness or numbness.  10-point ROS otherwise negative.  ____________________________________________   PHYSICAL EXAM:  VITAL SIGNS: ED Triage Vitals  Enc Vitals Group     BP 08/08/15 0017 126/70 mmHg     Pulse Rate 08/08/15  0017 78     Resp 08/08/15 0017 19     Temp 08/08/15 0017 97.5 F (36.4 C)     Temp Source 08/08/15 0017 Oral     SpO2 08/08/15 0017 99 %     Weight 08/08/15 0017 160 lb (72.576 kg)     Height 08/08/15 0017 5\' 3"  (1.6 m)     Head Cir --      Peak Flow --      Pain Score 08/08/15 0027 7     Pain Loc --      Pain Edu? --      Excl. in [place]? --     Constitutional: Alert and oriented. Well appearing and in mild distress. Eyes: Conjunctivae are normal. PERRL. EOMI. Head: Atraumatic. Nose: No congestion/rhinnorhea. Mouth/Throat: Mucous membranes are moist.  Oropharynx non-erythematous. Cardiovascular: Normal rate, regular rhythm. Grossly normal heart sounds.  Good peripheral circulation. Respiratory: Normal respiratory effort.  No retractions. Lungs CTAB. Gastrointestinal: Soft and nontender. No distention. No abdominal bruits. No CVA tenderness. Musculoskeletal: No lower extremity tenderness nor edema.   Neurologic:  Normal speech and language.  Skin:  Skin is warm, dry and intact.  Psychiatric: Mood and affect are normal.   ____________________________________________   LABS (all labs ordered are listed, but only abnormal results are displayed)  Labs Reviewed  CBC - Abnormal; Notable for the following:    RDW 15.5 (*)    All other components within normal limits  COMPREHENSIVE METABOLIC PANEL - Abnormal; Notable for the following:    Potassium 3.4 (*)    Glucose, Bld 145 (*)    All other components within normal limits  ETHANOL - Abnormal; Notable for the following:    Alcohol, Ethyl (B) 221 (*)    All other components within normal limits  TROPONIN I  TROPONIN I    ____________________________________________  EKG  ED ECG REPORT I, Loney Hering, the attending physician, personally viewed and interpreted this ECG.   Date: 08/08/2015  EKG Time: 0023  Rate: 77  Rhythm: normal sinus rhythm, atrial sensed, ventricular paced rhythm  Axis: normal  Intervals:none  ST&T Change: flipped t waved in lead II, III, avf, v3, v4,v5,v6  ____________________________________________  RADIOLOGY  CXR: COPD and cardiomegaly, no acute superimposed finding ____________________________________________   PROCEDURES  Procedure(s) performed: None  Critical Care performed: No  ____________________________________________   INITIAL IMPRESSION / ASSESSMENT AND PLAN / ED COURSE  Pertinent labs & imaging results that were available during my care of the patient were reviewed by me and considered in my medical decision making (see chart for details).  This is a 64 year old female who comes in today with some chest pain. The patient was saying that she did have feeling as if the pain was coming from her abdomen. I did the patient a GI cocktail and her pain did improve. The patient had 2 sets of troponins that were negative. The patient when she initially arrived reports that she was feeling okay and did not want to stay in the hospital. We contacted the patient's fianc and he did calm and pick the patient up to take her home. The patient does not have any further complaints at this time and she will be discharged to follow-up with her cardiologist. Otherwise the patient appears well and is having improvement of her chest pain. ____________________________________________   FINAL CLINICAL IMPRESSION(S) / ED DIAGNOSES  Final diagnoses:  Chest pain, unspecified chest pain type  Alcohol intoxication, uncomplicated ([place])  Loney Hering, MD 08/08/15 [id number]

## 2015-08-08 NOTE — Discharge Instructions (Signed)
°Alcohol Intoxication °Alcohol intoxication occurs when the amount of alcohol that a person has consumed impairs his or her ability to mentally and physically function. Alcohol directly impairs the normal chemical activity of the brain. Drinking large amounts of alcohol can lead to changes in mental function and behavior, and it can cause many physical effects that can be harmful.  °Alcohol intoxication can range in severity from mild to very severe. Various factors can affect the level of intoxication that occurs, such as the person's age, gender, weight, frequency of alcohol consumption, and the presence of other medical conditions (such as diabetes, seizures, or heart conditions). Dangerous levels of alcohol intoxication may occur when people drink large amounts of alcohol in a short period (binge drinking). Alcohol can also be especially dangerous when combined with certain prescription medicines or "recreational" drugs. °SIGNS AND SYMPTOMS °Some common signs and symptoms of mild alcohol intoxication include: °· Loss of coordination. °· Changes in mood and behavior. °· Impaired judgment. °· Slurred speech. °As alcohol intoxication progresses to more severe levels, other signs and symptoms will appear. These may include: °· Vomiting. °· Confusion and impaired memory. °· Slowed breathing. °· Seizures. °· Loss of consciousness. °DIAGNOSIS  °Your health care provider will take a medical history and perform a physical exam. You will be asked about the amount and type of alcohol you have consumed. Blood tests will be done to measure the concentration of alcohol in your blood. In many places, your blood alcohol level must be lower than 80 mg/dL (0.08%) to legally drive. However, many dangerous effects of alcohol can occur at much lower levels.  °TREATMENT  °People with alcohol intoxication often do not require treatment. Most of the effects of alcohol intoxication are temporary, and they go away as the alcohol  naturally leaves the body. Your health care provider will monitor your condition until you are stable enough to go home. Fluids are sometimes given through an IV access tube to help prevent dehydration.  °HOME CARE INSTRUCTIONS °· Do not drive after drinking alcohol. °· Stay hydrated. Drink enough water and fluids to keep your urine clear or pale yellow. Avoid caffeine.   °· Only take over-the-counter or prescription medicines as directed by your health care provider.   °SEEK MEDICAL CARE IF:  °· You have persistent vomiting.   °· You do not feel better after a few days. °· You have frequent alcohol intoxication. Your health care provider can help determine if you should see a substance use treatment counselor. °SEEK IMMEDIATE MEDICAL CARE IF:  °· You become shaky or tremble when you try to stop drinking.   °· You shake uncontrollably (seizure).   °· You throw up (vomit) blood. This may be bright red or may look like black coffee grounds.   °· You have blood in your stool. This may be bright red or may appear as a black, tarry, bad smelling stool.   °· You become lightheaded or faint.   °MAKE SURE YOU:  °· Understand these instructions. °· Will watch your condition. °· Will get help right away if you are not doing well or get worse. °Document Released: 08/02/2005 Document Revised: 06/25/2013 Document Reviewed: 03/28/2013 °Exit[employer]® Patient Information ©2015 [employer]. This information is not intended to replace advice given to you by your health care provider. Make sure you discuss any questions you have with your health care provider. °Chest Pain (Nonspecific) °It is often hard to give a specific diagnosis for the cause of chest pain. There is always a chance that your pain could   be related to something serious, such as a heart attack or a blood clot in the lungs. You need to follow up with your health care provider for further evaluation. °CAUSES  °· Heartburn. °· Pneumonia or bronchitis. °· Anxiety or  stress. °· Inflammation around your heart (pericarditis) or lung (pleuritis or pleurisy). °· A blood clot in the lung. °· A collapsed lung (pneumothorax). It can develop suddenly on its own (spontaneous pneumothorax) or from trauma to the chest. °· Shingles infection (herpes zoster virus). °The chest wall is composed of bones, muscles, and cartilage. Any of these can be the source of the pain. °· The bones can be bruised by injury. °· The muscles or cartilage can be strained by coughing or overwork. °· The cartilage can be affected by inflammation and become sore (costochondritis). °DIAGNOSIS  °Lab tests or other studies may be needed to find the cause of your pain. Your health care provider may have you take a test called an ambulatory electrocardiogram (ECG). An ECG records your heartbeat patterns over a 24-hour period. You may also have other tests, such as: °· Transthoracic echocardiogram (TTE). During echocardiography, sound waves are used to evaluate how blood flows through your heart. °· Transesophageal echocardiogram (TEE). °· Cardiac monitoring. This allows your health care provider to monitor your heart rate and rhythm in real time. °· Holter monitor. This is a portable device that records your heartbeat and can help diagnose heart arrhythmias. It allows your health care provider to track your heart activity for several days, if needed. °· Stress tests by exercise or by giving medicine that makes the heart beat faster. °TREATMENT  °· Treatment depends on what may be causing your chest pain. Treatment may include: °· Acid blockers for heartburn. °· Anti-inflammatory medicine. °· Pain medicine for inflammatory conditions. °· Antibiotics if an infection is present. °· You may be advised to change lifestyle habits. This includes stopping smoking and avoiding alcohol, caffeine, and chocolate. °· You may be advised to keep your head raised (elevated) when sleeping. This reduces the chance of acid going backward  from your stomach into your esophagus. °Most of the time, nonspecific chest pain will improve within 2-3 days with rest and mild pain medicine.  °HOME CARE INSTRUCTIONS  °· If antibiotics were prescribed, take them as directed. Finish them even if you start to feel better. °· For the next few days, avoid physical activities that bring on chest pain. Continue physical activities as directed. °· Do not use any tobacco products, including cigarettes, chewing tobacco, or electronic cigarettes. °· Avoid drinking alcohol. °· Only take medicine as directed by your health care provider. °· Follow your health care provider's suggestions for further testing if your chest pain does not go away. °· Keep any follow-up appointments you made. If you do not go to an appointment, you could develop lasting (chronic) problems with pain. If there is any problem keeping an appointment, call to reschedule. °SEEK MEDICAL CARE IF:  °· Your chest pain does not go away, even after treatment. °· You have a rash with blisters on your chest. °· You have a fever. °SEEK IMMEDIATE MEDICAL CARE IF:  °· You have increased chest pain or pain that spreads to your arm, neck, jaw, back, or abdomen. °· You have shortness of breath. °· You have an increasing cough, or you cough up blood. °· You have severe back or abdominal pain. °· You feel nauseous or vomit. °· You have severe weakness. °· You faint. °· You   have chills. °This is an emergency. Do not wait to see if the pain will go away. Get medical help at once. Call your local emergency services (911 in [place] Do not drive yourself to the hospital. °MAKE SURE YOU:  °· Understand these instructions. °· Will watch your condition. °· Will get help right away if you are not doing well or get worse. °Document Released: 08/02/2005 Document Revised: 10/28/2013 Document Reviewed: 05/28/2008 °Exit[employer]® Patient Information ©2015 [employer]. This information is not intended to replace advice given to you by  your health care provider. Make sure you discuss any questions you have with your health care provider. ° °

## 2015-08-08 NOTE — ED Notes (Signed)
Pt denies pain currently.

## 2015-08-08 NOTE — ED Notes (Signed)
Patient oxygen saturation 83% upon this RN entry to room, patient sleeping. Upon arousal, patient oxygen saturation increased to 99% RA. Patient placed on 2L nasal cannula for comfort, no increased work in breathing noted. MD notified.

## 2015-09-29 ENCOUNTER — Other Ambulatory Visit: Payer: Self-pay | Admitting: Internal Medicine

## 2016-06-06 ENCOUNTER — Ambulatory Visit (INDEPENDENT_AMBULATORY_CARE_PROVIDER_SITE_OTHER): Payer: Medicare Other | Admitting: Internal Medicine

## 2016-06-06 ENCOUNTER — Encounter: Payer: Self-pay | Admitting: Internal Medicine

## 2016-06-06 ENCOUNTER — Ambulatory Visit (INDEPENDENT_AMBULATORY_CARE_PROVIDER_SITE_OTHER): Payer: Medicare Other

## 2016-06-06 VITALS — BP 180/120 | HR 94 | Temp 98.0°F | Ht 63.0 in | Wt 174.5 lb

## 2016-06-06 DIAGNOSIS — R05 Cough: Secondary | ICD-10-CM | POA: Diagnosis not present

## 2016-06-06 DIAGNOSIS — E785 Hyperlipidemia, unspecified: Secondary | ICD-10-CM | POA: Diagnosis not present

## 2016-06-06 DIAGNOSIS — I10 Essential (primary) hypertension: Secondary | ICD-10-CM | POA: Diagnosis not present

## 2016-06-06 DIAGNOSIS — G8929 Other chronic pain: Secondary | ICD-10-CM | POA: Diagnosis not present

## 2016-06-06 DIAGNOSIS — J441 Chronic obstructive pulmonary disease with (acute) exacerbation: Secondary | ICD-10-CM

## 2016-06-06 DIAGNOSIS — M25551 Pain in right hip: Secondary | ICD-10-CM | POA: Diagnosis not present

## 2016-06-06 DIAGNOSIS — R059 Cough, unspecified: Secondary | ICD-10-CM

## 2016-06-06 MED ORDER — METHYLPREDNISOLONE ACETATE 40 MG/ML IJ SUSP
40.0000 mg | Freq: Once | INTRAMUSCULAR | Status: AC
  Administered 2016-06-06: 40 mg via INTRAMUSCULAR

## 2016-06-06 MED ORDER — TIOTROPIUM BROMIDE MONOHYDRATE 2.5 MCG/ACT IN AERS: INHALATION_SPRAY | RESPIRATORY_TRACT | 6 refills | Status: DC

## 2016-06-06 MED ORDER — PREDNISONE 10 MG PO TABS: ORAL_TABLET | ORAL | 0 refills | Status: DC

## 2016-06-06 MED ORDER — DOXYCYCLINE HYCLATE 100 MG PO CAPS: 100.0000 mg | ORAL_CAPSULE | Freq: Two times a day (BID) | ORAL | 0 refills | Status: DC

## 2016-06-06 MED ORDER — BENZONATATE 200 MG PO CAPS: 200.0000 mg | ORAL_CAPSULE | Freq: Three times a day (TID) | ORAL | 1 refills | Status: DC | PRN

## 2016-06-06 NOTE — Progress Notes (Signed)
Subjective:  Patient ID: Ashley [name], female    DOB: 1951/01/16  Age: 65 y.o. MRN: [id number]  CC: The primary encounter diagnosis was Cough. Diagnoses of Hyperlipidemia, Essential hypertension, Chronic right hip pain, COPD exacerbation ([place]), and Essential hypertension, benign were also pertinent to this visit.  HPI Ashley [name] presents for evaluation of cough and follow up on hypertension, CAD, s/p CABG and tobacco abuse.  Last seen over a year ago.  Has not seen her cardiologist Dr Glennon Mac at [facility] since  June 2016 .  Admitted to [facility] March 2016 for AICD failure requriing ICD replacement .  She is scheduled to see him at the end of the month.  Smoking 1/2 pack daily .    She has been coughing for two weeks,  Ribs AND BACK HURTING from persistent coughing,   Not sleeping at night due to cough ,  Cough is Nonproductive.  Very short of breath and wheezing,  Using albuterol inhaler 2-3 times daily . Still smoking 1/2 pacek  Sweating profusely   Elevated BP despite using cog and lisinopril        Review of Systems;  Patient denies headache, fevers, malaise, unintentional weight loss, skin rash, eye pain, sinus congestion and sinus pain, sore throat, dysphagia,  hemoptysis , cough, dyspnea, wheezing, chest pain, palpitations, orthopnea, edema, abdominal pain, nausea, melena, diarrhea, constipation, flank pain, dysuria, hematuria, urinary  Frequency, nocturia, numbness, tingling, seizures,  Focal weakness, Loss of consciousness,  Tremor, insomnia, depression, anxiety, and suicidal ideation.      Objective:  BP (!) 180/120   Pulse 94   Temp 98 F (36.7 C) (Oral)   Ht 5\' 3"  (1.6 m)   Wt 174 lb 8 oz (79.2 kg)   SpO2 96%   BMI 30.91 kg/m   BP Readings from Last 3 Encounters:  06/06/16 (!) 180/120  08/08/15 (!) 149/100  02/09/15 120/78    Wt Readings from Last 3 Encounters:  06/06/16 174 lb 8 oz (79.2 kg)  08/08/15 160 lb (72.6 kg)  02/09/15 162 lb 12.8 oz (73.8 kg)      General appearance: alert, cooperative and appears stated age Ears: normal TM's and external ear canals both ears Throat: lips, mucosa, and tongue normal; teeth and gums normal Neck: no adenopathy, no carotid bruit, supple, symmetrical, trachea midline and thyroid not enlarged, symmetric, no tenderness/mass/nodules Back: symmetric, no curvature. ROM normal. No CVA tenderness. Lungs: bilateral ronchi with wheeezing ,  No egophony.  Heart: regular rate and rhythm, S1, S2 normal, no murmur, click, rub or gallop Abdomen: soft, non-tender; bowel sounds normal; no masses,  no organomegaly Pulses: 2+ and symmetric Skin: Skin color, texture, turgor normal. No rashes or lesions Lymph nodes: Cervical, supraclavicular, and axillary nodes normal.  No results found for: HGBA1C  Lab Results  Component Value Date   CREATININE 0.83 06/06/2016   CREATININE 0.83 08/08/2015   CREATININE 0.84 02/09/2015    Lab Results  Component Value Date   WBC 8.4 06/06/2016   HGB 15.9 (H) 06/06/2016   HCT 45.0 06/06/2016   PLT 261.0 06/06/2016   GLUCOSE 93 06/06/2016   CHOL 229 (H) 06/06/2016   TRIG (H) 06/06/2016    477.0 Triglyceride is over 400; calculations on Lipids are invalid.   HDL 35.30 (L) 06/06/2016   LDLDIRECT 155.0 06/06/2016   LDLCALC 90 10/09/2014   ALT 37 (H) 06/06/2016   AST 31 06/06/2016   NA 141 06/06/2016   K 4.0 06/06/2016   CL  108 06/06/2016   CREATININE 0.83 06/06/2016   BUN 17 06/06/2016   CO2 21 06/06/2016   TSH 2.61 08/18/2014   INR 1.1 08/19/2014    Dg Chest 2 View  Result Date: 08/08/2015 CLINICAL DATA:  Chest pain EXAM: CHEST  2 VIEW COMPARISON:  08/20/2014 FINDINGS: Chronic cardiomegaly. Biventricular ICD/ pacer from the right in unremarkable position. Status post CABG. Hyperinflation and interstitial coarsening. There is no edema, consolidation, effusion, or pneumothorax. No acute osseous finding to explain chest pain. Remote left clavicle fracture with ORIF.  IMPRESSION: COPD and cardiomegaly.  No acute superimposed finding Electronically Signed   By: Monte Fantasia M.D.   On: 08/08/2015 01:02    Assessment & Plan:   Problem List Items Addressed This Visit    Essential hypertension, benign    Uncontrolled today.  Increasing lisinopril to 40 mg bid and increasing Coreg to 12.5 mg bid       COPD exacerbation ([place])    Current presentation suggest viral etiology. However he has COPD.  Will treat supportively,  Advised to start the antibiotic I have given  only if if symptoms fail to resolve in a few days or gprogress to include fevers, chest , facial or ear pain, or purulent/blood streaked sputum or nasal discharge.       Relevant Medications   Tiotropium Bromide Monohydrate (SPIRIVA RESPIMAT) 2.5 MCG/ACT AERS   benzonatate (TESSALON) 200 MG capsule   predniSONE (DELTASONE) 10 MG tablet   methylPREDNISolone acetate (DEPO-MEDROL) injection 40 mg (Completed)   Chronic right hip pain    Other Visit Diagnoses    Cough    -  Primary   Relevant Medications   methylPREDNISolone acetate (DEPO-MEDROL) injection 40 mg (Completed)   Other Relevant Orders   DG Chest 2 View (Completed)   CBC with Differential/Platelet (Completed)   Hyperlipidemia       Relevant Orders   Lipid panel (Completed)   LDL cholesterol, direct (Completed)   Essential hypertension       Relevant Orders   Comprehensive metabolic panel (Completed)      I have changed Ms. Wrede's SPIRIVA RESPIMAT to Tiotropium Bromide Monohydrate. I am also having her start on benzonatate, doxycycline, and predniSONE. Additionally, I am having her maintain her aspirin, nitroGLYCERIN, albuterol, atorvastatin, acetaminophen, ADVAIR DISKUS, lisinopril, carvedilol, clopidogrel, furosemide, omeprazole, and escitalopram. We administered methylPREDNISolone acetate.  Meds ordered this encounter  Medications  . Tiotropium Bromide Monohydrate (SPIRIVA RESPIMAT) 2.5 MCG/ACT AERS    Sig: INHALE TWO  SPRAYS BY MOUTH ONCE DAILY    Dispense:  4 g    Refill:  6  . benzonatate (TESSALON) 200 MG capsule    Sig: Take 1 capsule (200 mg total) by mouth 3 (three) times daily as needed for cough.    Dispense:  60 capsule    Refill:  1  . doxycycline (VIBRAMYCIN) 100 MG capsule    Sig: Take 1 capsule (100 mg total) by mouth 2 (two) times daily.    Dispense:  14 capsule    Refill:  0  . predniSONE (DELTASONE) 10 MG tablet    Sig: 6 tablets daily for 3 days, , then reduce by 1 tablet daily until gone    Dispense:  33 tablet    Refill:  0  . methylPREDNISolone acetate (DEPO-MEDROL) injection 40 mg    Medications Discontinued During This Encounter  Medication Reason  . SPIRIVA RESPIMAT 2.5 MCG/ACT AERS Reorder    Follow-up: Return in about 2 weeks (around 06/19/2016)  for follow up on hypertension and COPD.   [name], MD

## 2016-06-06 NOTE — Patient Instructions (Addendum)
Your blood pressure is SKY HIGH   Please increase your lisinopril to 40 mg daily  (take 2  20's daily)   Please increase carvedilol  To 12.6 mg twice daily  ofotal fo 4 pills daily 2 am and 2 pm)    You are having a COPD exacerbation which may aggravate your heart disease  If you become more short of breath than you are now,  You need to go to the nearest ER immediately or call 911  I will try to treat you at home with the following:  Prednisone:  60 mg daily for 3 days,  Then taper by 1 tablet daily until gone Doxycycline  1 tablet twice daily  With food for 7 days Tessalon cough capsules every 8 hours for cough  Continue your spiriva  And the Advair  Use your albuterol inhaler as needed   Return next Monday to check you blood pressure again and  to make sure you are improving

## 2016-06-07 LAB — CBC WITH DIFFERENTIAL/PLATELET
BASOS ABS: 0.1 10*3/uL (ref 0.0–0.1)
Basophils Relative: 0.7 % (ref 0.0–3.0)
EOS ABS: 0.2 10*3/uL (ref 0.0–0.7)
Eosinophils Relative: 2.5 % (ref 0.0–5.0)
HEMATOCRIT: 45 % (ref 36.0–46.0)
HEMOGLOBIN: 15.9 g/dL — AB (ref 12.0–15.0)
LYMPHS PCT: 29 % (ref 12.0–46.0)
Lymphs Abs: 2.4 10*3/uL (ref 0.7–4.0)
MCHC: 35.3 g/dL (ref 30.0–36.0)
MCV: 92.7 fl (ref 78.0–100.0)
MONOS PCT: 10.2 % (ref 3.0–12.0)
Monocytes Absolute: 0.9 10*3/uL (ref 0.1–1.0)
Neutro Abs: 4.8 10*3/uL (ref 1.4–7.7)
Neutrophils Relative %: 57.6 % (ref 43.0–77.0)
Platelets: 261 10*3/uL (ref 150.0–400.0)
RBC: 4.85 Mil/uL (ref 3.87–5.11)
RDW: 14.3 % (ref 11.5–15.5)
WBC: 8.4 10*3/uL (ref 4.0–10.5)

## 2016-06-07 LAB — COMPREHENSIVE METABOLIC PANEL
ALBUMIN: 4.5 g/dL (ref 3.5–5.2)
ALK PHOS: 70 U/L (ref 39–117)
ALT: 37 U/L — AB (ref 0–35)
AST: 31 U/L (ref 0–37)
BILIRUBIN TOTAL: 0.5 mg/dL (ref 0.2–1.2)
BUN: 17 mg/dL (ref 6–23)
CALCIUM: 9.9 mg/dL (ref 8.4–10.5)
CO2: 21 meq/L (ref 19–32)
CREATININE: 0.83 mg/dL (ref 0.40–1.20)
Chloride: 108 mEq/L (ref 96–112)
GFR: 73.77 mL/min (ref >60.00)
Glucose, Bld: 93 mg/dL (ref 70–99)
Potassium: 4 mEq/L (ref 3.5–5.1)
Sodium: 141 mEq/L (ref 135–145)
TOTAL PROTEIN: 7.9 g/dL (ref 6.0–8.3)

## 2016-06-07 LAB — LDL CHOLESTEROL, DIRECT: Direct LDL: 155 mg/dL

## 2016-06-07 LAB — LIPID PANEL
CHOLESTEROL: 229 mg/dL — AB (ref 0–200)
HDL: 35.3 mg/dL — ABNORMAL LOW (ref >39.00)
Total CHOL/HDL Ratio: 6

## 2016-06-07 NOTE — Assessment & Plan Note (Addendum)
Uncontrolled today.  Increasing lisinopril to 40 mg bid and increasing Coreg to 12.5 mg bid

## 2016-06-07 NOTE — Assessment & Plan Note (Signed)
Current presentation suggest viral etiology. However he has COPD.  Will treat supportively,  Advised to start the antibiotic I have given  only if if symptoms fail to resolve in a few days or gprogress to include fevers, chest , facial or ear pain, or purulent/blood streaked sputum or nasal discharge.

## 2016-06-08 ENCOUNTER — Telehealth: Payer: Self-pay

## 2016-06-08 NOTE — Telephone Encounter (Signed)
PA for Benzonatate, completed on cover my meds. thanks

## 2016-06-08 NOTE — Telephone Encounter (Signed)
PA for Benzonatate 200mg  has been denied.  Information from insurance company in folder for review.

## 2016-06-09 ENCOUNTER — Telehealth: Payer: Self-pay | Admitting: *Deleted

## 2016-06-09 ENCOUNTER — Encounter: Payer: Self-pay | Admitting: Internal Medicine

## 2016-06-09 MED ORDER — BENZONATATE 100 MG PO CAPS: 100.0000 mg | ORAL_CAPSULE | Freq: Three times a day (TID) | ORAL | 0 refills | Status: DC | PRN

## 2016-06-09 NOTE — Telephone Encounter (Signed)
Notified pt. 

## 2016-06-09 NOTE — Telephone Encounter (Signed)
No pneumonia. Emphysematous changes and aortic atherosclerosis.

## 2016-06-09 NOTE — Telephone Encounter (Signed)
There is nothing else I can prescribed that doesn't reqquire a signature,  So advise her to use Delsym or Robitussin AC    I will try submitting the lower dose of benzonatate (100 mg ) to see if it will go through.

## 2016-06-09 NOTE — Telephone Encounter (Signed)
Patient is requested Chest Xray results from 06/06/16.

## 2016-06-09 NOTE — Addendum Note (Signed)
Addended by: [name] on: 06/09/2016 08:50 AM   Modules accepted: Orders

## 2016-06-12 NOTE — Telephone Encounter (Signed)
Notified pt. 

## 2016-06-19 ENCOUNTER — Ambulatory Visit (INDEPENDENT_AMBULATORY_CARE_PROVIDER_SITE_OTHER): Payer: Medicare Other | Admitting: Internal Medicine

## 2016-06-19 VITALS — BP 120/72 | HR 71 | Temp 98.0°F | Resp 16 | Wt 176.0 lb

## 2016-06-19 DIAGNOSIS — R208 Other disturbances of skin sensation: Secondary | ICD-10-CM | POA: Diagnosis not present

## 2016-06-19 DIAGNOSIS — I10 Essential (primary) hypertension: Secondary | ICD-10-CM

## 2016-06-19 DIAGNOSIS — R200 Anesthesia of skin: Secondary | ICD-10-CM

## 2016-06-19 DIAGNOSIS — J441 Chronic obstructive pulmonary disease with (acute) exacerbation: Secondary | ICD-10-CM

## 2016-06-19 MED ORDER — GUAIFENESIN-CODEINE 100-10 MG/5ML PO SYRP: 5.0000 mL | ORAL_SOLUTION | Freq: Three times a day (TID) | ORAL | 0 refills | Status: DC | PRN

## 2016-06-19 MED ORDER — NITROGLYCERIN 0.4 MG SL SUBL: 0.4000 mg | SUBLINGUAL_TABLET | SUBLINGUAL | 3 refills | Status: DC | PRN

## 2016-06-19 NOTE — Progress Notes (Signed)
Subjective:  Patient ID: Ashley [name], female    DOB: 08-16-1951  Age: 65 y.o. MRN: [id number]  CC: The primary encounter diagnosis was Facial numbness. Diagnoses of Essential hypertension, benign and COPD exacerbation ([place]) were also pertinent to this visit.  HPI Ashley [name] presents for follow up on COPD exacerbation and uncontrolled hypertension  1) COPD:  Has improved on therapy. Cough no longer productive,  Less troublesome at night.  Still dyspneic with exertion.  Still smoking  .  HTN:  Has been taking higher dose of coreg (12.5 mg ) and lisinopril (40 mg bid ) since August 1 and BP has been significantly  Lower;  systolic  Has been as low as 109 . She has been noting episdoes of bilateral  facial numbness when her BP drops.     No facility-administered medications prior to visit.    Outpatient Medications Prior to Visit  Medication Sig Dispense Refill  . acetaminophen (TYLENOL) 500 MG tablet Maximum 6 tablets a day. (Patient taking differently: Take 500 mg by mouth every 4 (four) hours as needed for fever. Maximum 6 tablets a day.) 42 tablet 0  . ADVAIR DISKUS 250-50 MCG/DOSE AEPB INHALE 1 PUFF TWICE DAILY RINSE MOUTH AFTER EACH USE 180 each 2  . albuterol (PROVENTIL HFA;VENTOLIN HFA) 108 (90 BASE) MCG/ACT inhaler Inhale 2 puffs into the lungs every 6 (six) hours as needed for wheezing.    [name] aspirin 81 MG tablet Take 81 mg by mouth daily.    [name] atorvastatin (LIPITOR) 40 MG tablet Take 1 tablet (40 mg total) by mouth daily. 90 tablet 3  . carvedilol (COREG) 6.25 MG tablet TAKE ONE TABLET BY MOUTH TWICE DAILY WITH MEAL 180 tablet 2  . clopidogrel (PLAVIX) 75 MG tablet TAKE ONE TABLET BY MOUTH EVERY DAY 90 tablet 2  . escitalopram (LEXAPRO) 10 MG tablet TAKE ONE TABLET BY MOUTH EVERY DAY 90 tablet 2  . furosemide (LASIX) 20 MG tablet TAKE ONE TABLET BY MOUTH EVERY DAY 90 tablet 2  . lisinopril (PRINIVIL,ZESTRIL) 20 MG tablet TAKE ONE TABLET BY MOUTH EVERY DAY 90 tablet 2    . omeprazole (PRILOSEC) 40 MG capsule TAKE ONE CAPSULE BY MOUTH EVERY DAY 90 capsule 2  . Tiotropium Bromide Monohydrate (SPIRIVA RESPIMAT) 2.5 MCG/ACT AERS INHALE TWO SPRAYS BY MOUTH ONCE DAILY 4 g 6  . nitroGLYCERIN (NITROSTAT) 0.4 MG SL tablet Place 0.4 mg under the tongue every 5 (five) minutes as needed for chest pain.    . benzonatate (TESSALON) 100 MG capsule Take 1 capsule (100 mg total) by mouth 3 (three) times daily as needed for cough. 90 capsule 0  . doxycycline (VIBRAMYCIN) 100 MG capsule Take 1 capsule (100 mg total) by mouth 2 (two) times daily. 14 capsule 0  . predniSONE (DELTASONE) 10 MG tablet 6 tablets daily for 3 days, , then reduce by 1 tablet daily until gone 33 tablet 0    Review of Systems;  Patient denies headache, fevers, malaise, unintentional weight loss, skin rash, eye pain, sinus congestion and sinus pain, sore throat, dysphagia,  hemoptysis , cough, dyspnea, wheezing, chest pain, palpitations, orthopnea, edema, abdominal pain, nausea, melena, diarrhea, constipation, flank pain, dysuria, hematuria, urinary  Frequency, nocturia, numbness, tingling, seizures,  Focal weakness, Loss of consciousness,  Tremor, insomnia, depression, anxiety, and suicidal ideation.      Objective:  BP 120/72   Pulse 71   Temp 98 F (36.7 C)   Resp 16   Wt 176 lb (  79.8 kg)   SpO2 98%   BMI 31.18 kg/m   BP Readings from Last 3 Encounters:  06/21/16 (!) 146/66  06/19/16 120/72  06/06/16 (!) 180/120    Wt Readings from Last 3 Encounters:  06/21/16 178 lb 8 oz (81 kg)  06/19/16 176 lb (79.8 kg)  06/06/16 174 lb 8 oz (79.2 kg)    General appearance: alert, cooperative and appears stated age Ears: normal TM's and external ear canals both ears Throat: lips, mucosa, and tongue normal; teeth and gums normal Neck: no adenopathy, no carotid bruit, supple, symmetrical, trachea midline and thyroid not enlarged, symmetric, no tenderness/mass/nodules Back: symmetric, no curvature.  ROM normal. No CVA tenderness. Lungs: clear to auscultation bilaterally Heart: regular rate and rhythm, S1, S2 normal, no murmur, click, rub or gallop Abdomen: soft, non-tender; bowel sounds normal; no masses,  no organomegaly Pulses: 2+ and symmetric Skin: Skin color, texture, turgor normal. No rashes or lesions Lymph nodes: Cervical, supraclavicular, and axillary nodes normal.  No results found for: HGBA1C  Lab Results  Component Value Date   CREATININE 1.32 (H) 06/21/2016   CREATININE 1.64 (H) 06/20/2016   CREATININE 0.83 06/06/2016    Lab Results  Component Value Date   WBC 10.5 06/20/2016   HGB 12.6 06/20/2016   HCT 36.4 06/20/2016   PLT 228 06/20/2016   GLUCOSE 115 (H) 06/21/2016   CHOL 229 (H) 06/06/2016   TRIG (H) 06/06/2016    477.0 Triglyceride is over 400; calculations on Lipids are invalid.   HDL 35.30 (L) 06/06/2016   LDLDIRECT 155.0 06/06/2016   LDLCALC 90 10/09/2014   ALT 37 (H) 06/06/2016   AST 31 06/06/2016   NA 140 06/21/2016   K 3.8 06/21/2016   CL 110 06/21/2016   CREATININE 1.32 (H) 06/21/2016   BUN 18 06/21/2016   CO2 21 (L) 06/21/2016   TSH 2.301 06/21/2016   INR 1.1 08/19/2014     Assessment & Plan:   Problem List Items Addressed This Visit    Essential hypertension, benign    Improved control since increasing the carvedilol and the lisino pril.  However she is not tolerating the drsops in systolic to 0000000.  Considering her atherosclerosis, wonder if she is underperfusing brain when BP is too well controlled.  Reduce carvedilol dose to 6.25 mg bid and continue lisinopril increased dose for now.       Relevant Medications   nitroGLYCERIN (NITROSTAT) 0.4 MG SL tablet   COPD exacerbation ([place])    Improving with recent therapy.       Relevant Medications   guaiFENesin-codeine (CHERATUSSIN AC) 100-10 MG/5ML syrup   Facial numbness - Primary    Bilateral ,  Occurring with low BP.  Ordering carotid ultrasound to rule out significant stenosis        Relevant Orders   US Carotid Duplex Bilateral    Other Visit Diagnoses   None.     I have discontinued Ms. Hopes's doxycycline, predniSONE, and benzonatate. I have also changed her nitroGLYCERIN. Additionally, I am having her start on guaiFENesin-codeine. Lastly, I am having her maintain her aspirin, albuterol, atorvastatin, acetaminophen, ADVAIR DISKUS, lisinopril, carvedilol, clopidogrel, furosemide, omeprazole, escitalopram, and Tiotropium Bromide Monohydrate.  Meds ordered this encounter  Medications  . guaiFENesin-codeine (CHERATUSSIN AC) 100-10 MG/5ML syrup    Sig: Take 5 mLs by mouth 3 (three) times daily as needed for cough.    Dispense:  120 mL    Refill:  0  . nitroGLYCERIN (NITROSTAT) 0.4 MG SL  tablet    Sig: Place 1 tablet (0.4 mg total) under the tongue every 5 (five) minutes as needed for chest pain.    Dispense:  30 tablet    Refill:  3    Medications Discontinued During This Encounter  Medication Reason  . benzonatate (TESSALON) 100 MG capsule Cost of medication  . doxycycline (VIBRAMYCIN) 100 MG capsule Completed Course  . predniSONE (DELTASONE) 10 MG tablet Completed Course  . nitroGLYCERIN (NITROSTAT) 0.4 MG SL tablet Reorder    Follow-up: No Follow-up on file.   [name], MD

## 2016-06-19 NOTE — Progress Notes (Signed)
Pre visit review using our clinic review tool, if applicable. No additional management support is needed unless otherwise documented below in the visit note. 

## 2016-06-19 NOTE — Patient Instructions (Addendum)
Reduce the dose of carvedilol back to your previous dose of  6.25 mg twice dily  Continue the lisinopril at 40 mg twice daily  I am ordering carotid dopplers to evaluate for any stenosis that may be present causing your facial numbness  Try using benadryl before bedtime 25 mg ,    I will  send rx for cheratussin for cough to [facility]

## 2016-06-20 ENCOUNTER — Encounter: Payer: Self-pay | Admitting: Emergency Medicine

## 2016-06-20 ENCOUNTER — Inpatient Hospital Stay
Admission: EM | Admit: 2016-06-20 | Discharge: 2016-06-22 | DRG: 684 | Disposition: A | Discharge status: Home / Self Care | Payer: Medicare Other | Attending: Internal Medicine | Admitting: Internal Medicine

## 2016-06-20 DIAGNOSIS — Z8041 Family history of malignant neoplasm of ovary: Secondary | ICD-10-CM

## 2016-06-20 DIAGNOSIS — N179 Acute kidney failure, unspecified: Secondary | ICD-10-CM | POA: Diagnosis not present

## 2016-06-20 DIAGNOSIS — E785 Hyperlipidemia, unspecified: Secondary | ICD-10-CM | POA: Diagnosis present

## 2016-06-20 DIAGNOSIS — Z951 Presence of aortocoronary bypass graft: Secondary | ICD-10-CM

## 2016-06-20 DIAGNOSIS — F17210 Nicotine dependence, cigarettes, uncomplicated: Secondary | ICD-10-CM | POA: Diagnosis present

## 2016-06-20 DIAGNOSIS — Z716 Tobacco abuse counseling: Secondary | ICD-10-CM

## 2016-06-20 DIAGNOSIS — Z85038 Personal history of other malignant neoplasm of large intestine: Secondary | ICD-10-CM

## 2016-06-20 DIAGNOSIS — R55 Syncope and collapse: Secondary | ICD-10-CM

## 2016-06-20 DIAGNOSIS — Z90710 Acquired absence of both cervix and uterus: Secondary | ICD-10-CM

## 2016-06-20 DIAGNOSIS — I2510 Atherosclerotic heart disease of native coronary artery without angina pectoris: Secondary | ICD-10-CM | POA: Diagnosis present

## 2016-06-20 DIAGNOSIS — Z86010 Personal history of colonic polyps: Secondary | ICD-10-CM

## 2016-06-20 DIAGNOSIS — I509 Heart failure, unspecified: Secondary | ICD-10-CM | POA: Diagnosis not present

## 2016-06-20 DIAGNOSIS — Z8049 Family history of malignant neoplasm of other genital organs: Secondary | ICD-10-CM

## 2016-06-20 DIAGNOSIS — Z833 Family history of diabetes mellitus: Secondary | ICD-10-CM

## 2016-06-20 DIAGNOSIS — I10 Essential (primary) hypertension: Secondary | ICD-10-CM | POA: Diagnosis not present

## 2016-06-20 DIAGNOSIS — Z885 Allergy status to narcotic agent status: Secondary | ICD-10-CM

## 2016-06-20 DIAGNOSIS — Z950 Presence of cardiac pacemaker: Secondary | ICD-10-CM

## 2016-06-20 DIAGNOSIS — I110 Hypertensive heart disease with heart failure: Secondary | ICD-10-CM | POA: Diagnosis not present

## 2016-06-20 DIAGNOSIS — K219 Gastro-esophageal reflux disease without esophagitis: Secondary | ICD-10-CM | POA: Diagnosis present

## 2016-06-20 DIAGNOSIS — Z8261 Family history of arthritis: Secondary | ICD-10-CM

## 2016-06-20 DIAGNOSIS — Z8249 Family history of ischemic heart disease and other diseases of the circulatory system: Secondary | ICD-10-CM

## 2016-06-20 DIAGNOSIS — J449 Chronic obstructive pulmonary disease, unspecified: Secondary | ICD-10-CM | POA: Diagnosis present

## 2016-06-20 NOTE — ED Triage Notes (Signed)
Pt comes into the ED via EMS from home c/o syncopal episodes x2 in a 10 min span.  Patient had BP meds (lisinopril and coreg) increased in the past month.  Patient's pressure was 98/63.  Patient denies hitting head with the LOC. States she has been dealing with numbness, fatigue, and headaches since they changed her medication.  Patent has h/o COPD, CHF, and pacemaker/defibrillator placed.

## 2016-06-21 ENCOUNTER — Inpatient Hospital Stay
Admit: 2016-06-21 | Discharge: 2016-06-21 | Disposition: A | Discharge status: Home / Self Care | Payer: Medicare Other | Attending: Internal Medicine | Admitting: Internal Medicine

## 2016-06-21 ENCOUNTER — Encounter: Payer: Self-pay | Admitting: *Deleted

## 2016-06-21 ENCOUNTER — Encounter: Payer: Self-pay | Admitting: Internal Medicine

## 2016-06-21 ENCOUNTER — Telehealth: Payer: Self-pay | Admitting: Internal Medicine

## 2016-06-21 DIAGNOSIS — Z8249 Family history of ischemic heart disease and other diseases of the circulatory system: Secondary | ICD-10-CM | POA: Diagnosis not present

## 2016-06-21 DIAGNOSIS — Z885 Allergy status to narcotic agent status: Secondary | ICD-10-CM | POA: Diagnosis not present

## 2016-06-21 DIAGNOSIS — R200 Anesthesia of skin: Secondary | ICD-10-CM | POA: Insufficient documentation

## 2016-06-21 DIAGNOSIS — Z90710 Acquired absence of both cervix and uterus: Secondary | ICD-10-CM | POA: Diagnosis not present

## 2016-06-21 DIAGNOSIS — Z8041 Family history of malignant neoplasm of ovary: Secondary | ICD-10-CM | POA: Diagnosis not present

## 2016-06-21 DIAGNOSIS — R55 Syncope and collapse: Secondary | ICD-10-CM | POA: Diagnosis not present

## 2016-06-21 DIAGNOSIS — Z85038 Personal history of other malignant neoplasm of large intestine: Secondary | ICD-10-CM | POA: Diagnosis not present

## 2016-06-21 DIAGNOSIS — Z716 Tobacco abuse counseling: Secondary | ICD-10-CM | POA: Diagnosis not present

## 2016-06-21 DIAGNOSIS — N179 Acute kidney failure, unspecified: Secondary | ICD-10-CM | POA: Diagnosis present

## 2016-06-21 DIAGNOSIS — Z951 Presence of aortocoronary bypass graft: Secondary | ICD-10-CM | POA: Diagnosis not present

## 2016-06-21 DIAGNOSIS — E785 Hyperlipidemia, unspecified: Secondary | ICD-10-CM | POA: Diagnosis present

## 2016-06-21 DIAGNOSIS — J449 Chronic obstructive pulmonary disease, unspecified: Secondary | ICD-10-CM | POA: Diagnosis present

## 2016-06-21 DIAGNOSIS — I509 Heart failure, unspecified: Secondary | ICD-10-CM | POA: Diagnosis present

## 2016-06-21 DIAGNOSIS — Z86010 Personal history of colonic polyps: Secondary | ICD-10-CM | POA: Diagnosis not present

## 2016-06-21 DIAGNOSIS — K219 Gastro-esophageal reflux disease without esophagitis: Secondary | ICD-10-CM | POA: Diagnosis present

## 2016-06-21 DIAGNOSIS — Z833 Family history of diabetes mellitus: Secondary | ICD-10-CM | POA: Diagnosis not present

## 2016-06-21 DIAGNOSIS — Z8261 Family history of arthritis: Secondary | ICD-10-CM | POA: Diagnosis not present

## 2016-06-21 DIAGNOSIS — Z950 Presence of cardiac pacemaker: Secondary | ICD-10-CM | POA: Diagnosis not present

## 2016-06-21 DIAGNOSIS — Z8049 Family history of malignant neoplasm of other genital organs: Secondary | ICD-10-CM | POA: Diagnosis not present

## 2016-06-21 DIAGNOSIS — I10 Essential (primary) hypertension: Secondary | ICD-10-CM | POA: Diagnosis not present

## 2016-06-21 DIAGNOSIS — I110 Hypertensive heart disease with heart failure: Secondary | ICD-10-CM | POA: Diagnosis present

## 2016-06-21 DIAGNOSIS — F17210 Nicotine dependence, cigarettes, uncomplicated: Secondary | ICD-10-CM | POA: Diagnosis present

## 2016-06-21 DIAGNOSIS — I2510 Atherosclerotic heart disease of native coronary artery without angina pectoris: Secondary | ICD-10-CM | POA: Diagnosis present

## 2016-06-21 LAB — URINALYSIS COMPLETE WITH MICROSCOPIC (ARMC ONLY)
BILIRUBIN URINE: NEGATIVE
GLUCOSE, UA: NEGATIVE mg/dL
KETONES UR: NEGATIVE mg/dL
LEUKOCYTES UA: NEGATIVE
NITRITE: NEGATIVE
PH: 6 (ref 5.0–8.0)
Protein, ur: NEGATIVE mg/dL
Specific Gravity, Urine: 1.003 — ABNORMAL LOW (ref 1.005–1.030)

## 2016-06-21 LAB — TROPONIN I
Troponin I: <0.03 ng/mL (ref <0.03)
Troponin I: <0.03 ng/mL (ref <0.03)

## 2016-06-21 LAB — BASIC METABOLIC PANEL
ANION GAP: 9 (ref 5–15)
Anion gap: 11 (ref 5–15)
BUN: 17 mg/dL (ref 6–20)
BUN: 18 mg/dL (ref 6–20)
CO2: 20 mmol/L — ABNORMAL LOW (ref 22–32)
CO2: 21 mmol/L — AB (ref 22–32)
CREATININE: 1.64 mg/dL — AB (ref 0.44–1.00)
Calcium: 8.4 mg/dL — ABNORMAL LOW (ref 8.9–10.3)
Calcium: 8.6 mg/dL — ABNORMAL LOW (ref 8.9–10.3)
Chloride: 107 mmol/L (ref 101–111)
Chloride: 110 mmol/L (ref 101–111)
Creatinine, Ser: 1.32 mg/dL — ABNORMAL HIGH (ref 0.44–1.00)
GFR calc Af Amer: 37 mL/min — ABNORMAL LOW (ref >60)
GFR calc Af Amer: 49 mL/min — ABNORMAL LOW (ref >60)
GFR calc non Af Amer: 42 mL/min — ABNORMAL LOW (ref >60)
GFR, EST NON AFRICAN AMERICAN: 32 mL/min — AB (ref >60)
GLUCOSE: 115 mg/dL — AB (ref 65–99)
GLUCOSE: 131 mg/dL — AB (ref 65–99)
POTASSIUM: 3.8 mmol/L (ref 3.5–5.1)
Potassium: 3.5 mmol/L (ref 3.5–5.1)
SODIUM: 138 mmol/L (ref 135–145)
Sodium: 140 mmol/L (ref 135–145)

## 2016-06-21 LAB — CBC
HCT: 36.4 % (ref 35.0–47.0)
Hemoglobin: 12.6 g/dL (ref 12.0–16.0)
MCH: 33.2 pg (ref 26.0–34.0)
MCHC: 34.7 g/dL (ref 32.0–36.0)
MCV: 95.7 fL (ref 80.0–100.0)
PLATELETS: 228 10*3/uL (ref 150–440)
RBC: 3.81 MIL/uL (ref 3.80–5.20)
RDW: 14.4 % (ref 11.5–14.5)
WBC: 10.5 10*3/uL (ref 3.6–11.0)

## 2016-06-21 LAB — TSH: TSH: 2.301 u[IU]/mL (ref 0.350–4.500)

## 2016-06-21 LAB — HEMOGLOBIN A1C: Hgb A1c MFr Bld: 5.7 % (ref 4.0–6.0)

## 2016-06-21 MED ORDER — ACETAMINOPHEN 325 MG PO TABS
650.0000 mg | ORAL_TABLET | Freq: Four times a day (QID) | ORAL | Status: DC | PRN
  Administered 2016-06-21 – 2016-06-22 (×3): 650 mg via ORAL
  Filled 2016-06-21 (×4): qty 2

## 2016-06-21 MED ORDER — GUAIFENESIN-CODEINE 100-10 MG/5ML PO SOLN: 5.0000 mL | Freq: Three times a day (TID) | ORAL | Status: DC | PRN

## 2016-06-21 MED ORDER — NITROGLYCERIN 0.4 MG SL SUBL: 0.4000 mg | SUBLINGUAL_TABLET | SUBLINGUAL | Status: DC | PRN

## 2016-06-21 MED ORDER — CLOPIDOGREL BISULFATE 75 MG PO TABS
75.0000 mg | ORAL_TABLET | Freq: Every day | ORAL | Status: DC
  Administered 2016-06-21 – 2016-06-22 (×2): 75 mg via ORAL
  Filled 2016-06-21 (×2): qty 1

## 2016-06-21 MED ORDER — PANTOPRAZOLE SODIUM 40 MG PO TBEC
40.0000 mg | DELAYED_RELEASE_TABLET | Freq: Every day | ORAL | Status: DC
  Administered 2016-06-21 – 2016-06-22 (×2): 40 mg via ORAL
  Filled 2016-06-21 (×2): qty 1

## 2016-06-21 MED ORDER — ASPIRIN EC 81 MG PO TBEC
81.0000 mg | DELAYED_RELEASE_TABLET | Freq: Every day | ORAL | Status: DC
  Administered 2016-06-21 – 2016-06-22 (×2): 81 mg via ORAL
  Filled 2016-06-21 (×2): qty 1

## 2016-06-21 MED ORDER — CARVEDILOL 6.25 MG PO TABS
6.2500 mg | ORAL_TABLET | Freq: Two times a day (BID) | ORAL | Status: DC
  Administered 2016-06-21 – 2016-06-22 (×3): 6.25 mg via ORAL
  Filled 2016-06-21 (×3): qty 1

## 2016-06-21 MED ORDER — ESCITALOPRAM OXALATE 10 MG PO TABS
10.0000 mg | ORAL_TABLET | Freq: Every day | ORAL | Status: DC
  Administered 2016-06-21 – 2016-06-22 (×2): 10 mg via ORAL
  Filled 2016-06-21 (×2): qty 1

## 2016-06-21 MED ORDER — FUROSEMIDE 20 MG PO TABS
20.0000 mg | ORAL_TABLET | Freq: Every day | ORAL | Status: DC
  Administered 2016-06-21 – 2016-06-22 (×2): 20 mg via ORAL
  Filled 2016-06-21 (×2): qty 1

## 2016-06-21 MED ORDER — HEPARIN SODIUM (PORCINE) 5000 UNIT/ML IJ SOLN
5000.0000 [IU] | Freq: Three times a day (TID) | INTRAMUSCULAR | Status: DC
  Administered 2016-06-21 – 2016-06-22 (×4): 5000 [IU] via SUBCUTANEOUS
  Filled 2016-06-21 (×4): qty 1

## 2016-06-21 MED ORDER — ONDANSETRON HCL 4 MG PO TABS: 4.0000 mg | ORAL_TABLET | Freq: Four times a day (QID) | ORAL | Status: DC | PRN

## 2016-06-21 MED ORDER — ALBUTEROL SULFATE (2.5 MG/3ML) 0.083% IN NEBU: 3.0000 mL | INHALATION_SOLUTION | RESPIRATORY_TRACT | Status: DC | PRN

## 2016-06-21 MED ORDER — SODIUM CHLORIDE 0.9 % IV SOLN
INTRAVENOUS | Status: DC
  Administered 2016-06-21: 05:00:00 via INTRAVENOUS

## 2016-06-21 MED ORDER — ONDANSETRON HCL 4 MG/2ML IJ SOLN: 4.0000 mg | Freq: Four times a day (QID) | INTRAMUSCULAR | Status: DC | PRN

## 2016-06-21 MED ORDER — DOCUSATE SODIUM 100 MG PO CAPS
100.0000 mg | ORAL_CAPSULE | Freq: Two times a day (BID) | ORAL | Status: DC
  Administered 2016-06-21: 100 mg via ORAL
  Filled 2016-06-21 (×2): qty 1

## 2016-06-21 MED ORDER — LISINOPRIL 10 MG PO TABS
20.0000 mg | ORAL_TABLET | Freq: Every day | ORAL | Status: DC
Start: 2016-06-21 — End: 2016-06-21

## 2016-06-21 MED ORDER — ATORVASTATIN CALCIUM 20 MG PO TABS
40.0000 mg | ORAL_TABLET | Freq: Every day | ORAL | Status: DC
  Administered 2016-06-21 – 2016-06-22 (×2): 40 mg via ORAL
  Filled 2016-06-21 (×2): qty 2

## 2016-06-21 MED ORDER — MOMETASONE FURO-FORMOTEROL FUM 200-5 MCG/ACT IN AERO
2.0000 | INHALATION_SPRAY | Freq: Two times a day (BID) | RESPIRATORY_TRACT | Status: DC
  Administered 2016-06-21 – 2016-06-22 (×3): 2 via RESPIRATORY_TRACT
  Filled 2016-06-21: qty 8.8

## 2016-06-21 MED ORDER — SODIUM CHLORIDE 0.9 % IV SOLN
INTRAVENOUS | Status: DC
  Administered 2016-06-21: 02:00:00 via INTRAVENOUS

## 2016-06-21 MED ORDER — ACETAMINOPHEN 650 MG RE SUPP: 650.0000 mg | Freq: Four times a day (QID) | RECTAL | Status: DC | PRN

## 2016-06-21 MED ORDER — OXYCODONE HCL 5 MG PO TABS: 5.0000 mg | ORAL_TABLET | ORAL | Status: DC | PRN

## 2016-06-21 MED ORDER — NICOTINE 21 MG/24HR TD PT24
21.0000 mg | MEDICATED_PATCH | Freq: Every day | TRANSDERMAL | Status: DC
  Administered 2016-06-21 – 2016-06-22 (×2): 21 mg via TRANSDERMAL
  Filled 2016-06-21 (×2): qty 1

## 2016-06-21 MED ORDER — TIOTROPIUM BROMIDE MONOHYDRATE 18 MCG IN CAPS
18.0000 ug | ORAL_CAPSULE | Freq: Every day | RESPIRATORY_TRACT | Status: DC
  Administered 2016-06-21 – 2016-06-22 (×2): 18 ug via RESPIRATORY_TRACT
  Filled 2016-06-21: qty 5

## 2016-06-21 NOTE — Progress Notes (Signed)
Patient resting in the bed at this time, remains alert and oriented, no syncope episode this shift, Echocardiogram pending, awaiting to see cardiologist, will continue to monitor .

## 2016-06-21 NOTE — Assessment & Plan Note (Signed)
Improving with recent therapy.

## 2016-06-21 NOTE — Telephone Encounter (Signed)
FYI, Patient remains in hospital. thanks

## 2016-06-21 NOTE — Progress Notes (Signed)
Pt was admitted this am. No complaint.  VS reviewed. PE done. A/P: This is a 65 year old female admitted for syncope and acute kidney injury.  1. Syncope: Etiology is unclear. F/u echocardiogram as well as a cardiology consult. Continue to monitor blood pressure and telemetry. 2. Acute kidney injury: improving with intravenous fluid. hold lisinopril. 3. Essential hypertension: Controlled for now; continue carvedilol. 4. Coronary artery disease: Stable; continue aspirin, Plavix. Nitroglycerin as needed 5. Hyperlipidemia: Continue statin therapy 6. COPD: Stable. Continue inhaled corticosteroid, Spiriva. Albuterol as needed. 7. Tobacco abuse: NicoDerm patch, smoking cessation was counseled for 3 min.  Possible d/c home tomorrow.

## 2016-06-21 NOTE — H&P (Signed)
Ashley [name] is an 65 y.o. female.   Chief Complaint: "Syncope" HPI: The patient with past medical history of coronary artery disease status post CABG and pacemaker placement as well as hypertension presents emergency department after an episode of unresponsiveness. She states that she was sitting on the couch when she recalls feeling lightheaded. Apparently she lost consciousness and was difficult to arouse by her fianc. She regained consciousness without intervention in the emergency department complaining of headache as well as generalized weakness. She endorses circumferential chest pain that does not radiate. This has been chronic and is usually associated with uncontrolled blood pressure. She states that her primary care doctor had recently started her on lisinopril and then doubled the dose as well as the dose of her carvedilol (the latter of which has been reduced to its original dose at this time). In the emergency department her blood pressure was relatively controlled but her kidney function was found to be decreased. Due to syncope and acute kidney injury the emergency department staff called for admission.  Past Medical History:  Diagnosis Date  . CAD (coronary artery disease)    s/p CABG  . Colon cancer ([place])   . Depression   . GERD (gastroesophageal reflux disease)   . Hx of colonic polyps   . Hyperlipidemia   . Hypertension   . 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 arm surgery     fracture, has plates and screws  . CABG with mitral valve repair    . COLON SURGERY     colon cancer  . TOTAL ABDOMINAL HYSTERECTOMY  1999   history of abnormal pap    Family History  Problem Relation Age of Onset  . Arthritis Mother   . Cancer Mother     uterus cancer  . Hyperlipidemia Mother   . Hypertension Mother   . Heart disease Mother   . Diabetes Mother   . Hyperlipidemia Father   . Hypertension  Father   . Heart disease Father   . Diabetes Father   . Cancer Sister     ovary cancer  . Diabetes Maternal Grandmother   . Hypertension Maternal Grandmother   . Arthritis Maternal Grandmother   . Hypertension Maternal Grandfather   . Hypertension Paternal Grandmother   . Hypertension Paternal Grandfather   . Heart disease Paternal Grandfather    Social History:  reports that she has been smoking Cigarettes.  She has never used smokeless tobacco. She reports that she drinks about 1.2 oz of alcohol per week . She reports that she does not use drugs.  Allergies:  Allergies  Allergen Reactions  . Hydrocodone-Acetaminophen Nausea Only      Results for orders placed or performed during the hospital encounter of 06/20/16 (from the past 48 hour(s))  Basic metabolic panel     Status: Abnormal   Collection Time: 06/20/16 11:48 PM  Result Value Ref Range   Sodium 138 135 - 145 mmol/L   Potassium 3.5 3.5 - 5.1 mmol/L   Chloride 107 101 - 111 mmol/L   CO2 20 (L) 22 - 32 mmol/L   Glucose, Bld 131 (H) 65 - 99 mg/dL   BUN 17 6 - 20 mg/dL   Creatinine, Ser 1.64 (H) 0.44 - 1.00 mg/dL   Calcium 8.4 (L) 8.9 - 10.3 mg/dL   GFR calc non Af Amer 32 (L) >60 mL/min   GFR  calc Af Amer 37 (L) >60 mL/min    Comment: (NOTE) The eGFR has been calculated using the CKD EPI equation. This calculation has not been validated in all clinical situations. eGFR's persistently <60 mL/min signify possible Chronic Kidney Disease.    Anion gap 11 5 - 15  CBC     Status: None   Collection Time: 06/20/16 11:48 PM  Result Value Ref Range   WBC 10.5 3.6 - 11.0 K/uL   RBC 3.81 3.80 - 5.20 MIL/uL   Hemoglobin 12.6 12.0 - 16.0 g/dL   HCT 74.5 12.1 - 92.1 %   MCV 95.7 80.0 - 100.0 fL   MCH 33.2 26.0 - 34.0 pg   MCHC 34.7 32.0 - 36.0 g/dL   RDW 74.2 06.6 - 15.1 %   Platelets 228 150 - 440 K/uL  Urinalysis complete, with microscopic     Status: Abnormal   Collection Time: 06/20/16 11:48 PM  Result Value Ref  Range   Color, Urine STRAW (A) YELLOW   APPearance CLEAR (A) CLEAR   Glucose, UA NEGATIVE NEGATIVE mg/dL   Bilirubin Urine NEGATIVE NEGATIVE   Ketones, ur NEGATIVE NEGATIVE mg/dL   Specific Gravity, Urine 1.003 (L) 1.005 - 1.030   Hgb urine dipstick 1+ (A) NEGATIVE   pH 6.0 5.0 - 8.0   Protein, ur NEGATIVE NEGATIVE mg/dL   Nitrite NEGATIVE NEGATIVE   Leukocytes, UA NEGATIVE NEGATIVE   RBC / HPF 0-5 0 - 5 RBC/hpf   WBC, UA 0-5 0 - 5 WBC/hpf   Bacteria, UA RARE (A) NONE SEEN   Squamous Epithelial / LPF 0-5 (A) NONE SEEN  Troponin I     Status: None   Collection Time: 06/20/16 11:48 PM  Result Value Ref Range   Troponin I <0.03 <0.03 ng/mL  TSH     Status: None   Collection Time: 06/21/16  4:50 AM  Result Value Ref Range   TSH 2.301 0.350 - 4.500 uIU/mL  Troponin I     Status: None   Collection Time: 06/21/16  4:50 AM  Result Value Ref Range   Troponin I <0.03 <0.03 ng/mL   No results found.  Review of Systems  Constitutional: Negative for chills and fever.  HENT: Negative for sore throat and tinnitus.   Eyes: Negative for blurred vision and redness.  Respiratory: Negative for cough and shortness of breath.   Cardiovascular: Positive for chest pain (Chronic). Negative for palpitations, orthopnea and PND.  Gastrointestinal: Negative for abdominal pain, diarrhea, nausea and vomiting.  Genitourinary: Negative for dysuria, frequency and urgency.  Musculoskeletal: Negative for joint pain and myalgias.  Skin: Negative for rash.       No lesions  Neurological: Positive for headaches. Negative for speech change, focal weakness and weakness.  Endo/Heme/Allergies: Does not bruise/bleed easily.       No temperature intolerance  Psychiatric/Behavioral: Negative for depression and suicidal ideas.    Blood pressure (!) 157/75, pulse 70, temperature 98.4 F (36.9 C), temperature source Oral, resp. rate 20, height 5\' 3"  (1.6 m), weight 81 kg (178 lb 8 oz), SpO2 96 %. Physical Exam   Vitals reviewed. Constitutional: She is oriented to person, place, and time. She appears well-developed and well-nourished. No distress.  HENT:  Head: Normocephalic and atraumatic.  Mouth/Throat: Oropharynx is clear and moist.  Eyes: Conjunctivae and EOM are normal. Pupils are equal, round, and reactive to light. No scleral icterus.  Neck: Normal range of motion. Neck supple. No tracheal deviation present. No thyromegaly present.  Cardiovascular: Normal rate, regular rhythm and normal heart sounds.  Exam reveals no gallop and no friction rub.   No murmur heard. Respiratory: Effort normal and breath sounds normal.  GI: Soft. Bowel sounds are normal. She exhibits no distension. There is no tenderness.  Genitourinary:  Genitourinary Comments: Deferred  Lymphadenopathy:    She has no cervical adenopathy.  Neurological: She is alert and oriented to person, place, and time. No cranial nerve deficit. She exhibits normal muscle tone.  Skin: Skin is warm and dry.  Psychiatric: She has a normal mood and affect. Her behavior is normal. Judgment and thought content normal.     Assessment/Plan This is a 65 year old female admitted for syncope and acute kidney injury. 1. Syncope: Etiology is unclear. The patient has an atrial paced heart rhythm. She denies palpitations or acute chest pain. She denies palpitations and she also denies attempting to stand prior to passing out. I've ordered an echocardiogram as well as a cardiology consult. Continue to monitor blood pressure and telemetry. 2. Acute kidney injury: Hydrate with intravenous fluid. Diabetes continue lisinopril. 3. Essential hypertension: Controlled for now; continue carvedilol. 4. Coronary artery disease: Stable; continue aspirin, Plavix. Nitroglycerin as needed 5. Hyperlipidemia: Continue statin therapy 6. COPD: Stable. Continue inhaled corticosteroid, Spiriva. Albuterol as needed. 7. Tobacco abuse: NicoDerm patch 8. Depression: Continue  Lexapro 9. DVT prophylaxis: Heparin 10. GI prophylaxis: Pantoprazole The patient is a full code. Time spent on admission orders and patient care approximately 45 minutes  Harrie Foreman, MD 06/21/2016, 7:55 AM

## 2016-06-21 NOTE — Assessment & Plan Note (Signed)
Improved control since increasing the carvedilol and the lisino pril.  However she is not tolerating the drsops in systolic to 0000000.  Considering her atherosclerosis, wonder if she is underperfusing brain when BP is too well controlled.  Reduce carvedilol dose to 6.25 mg bid and continue lisinopril increased dose for now.

## 2016-06-21 NOTE — Telephone Encounter (Signed)
Herbert Pun, a friend of Ms. Zotter, called saying Ms. Ruhe was admitted into [facility] last night due to her BP dropping to 99/55. She's still there today and wanted Dr. Derrel Nip to be made aware of this.  Ph# [phone] Thank you.

## 2016-06-21 NOTE — ED Notes (Signed)
Family of patient can be reached and updated at [phone].

## 2016-06-21 NOTE — ED Provider Notes (Signed)
[facility] Emergency Department Provider Note    ____________________________________________   I have reviewed the triage vital signs and the nursing notes.   HISTORY  Chief Complaint Loss of Consciousness   History limited by: Not Limited   HPI Ashley [name] is a 65 y.o. female who presents to the emergency department today with concerns that her blood pressure was dropping too low and that she passed out twice earlier today. She did have some associated chest pain. The patient has a history of CABG as well as just some heart failure and does have a pacemaker. The patient states that her blood pressure medications were changed and increased roughly 2 weeks ago. She states that today she had been feeling dizzy, she had headaches. She gets these symptoms and her blood pressure gets low. She was sitting on the couch both times she passed out. She denies any recent fevers.    Past Medical History:  Diagnosis Date  . CAD (coronary artery disease)    s/p CABG  . Colon cancer ([place])   . Depression   . GERD (gastroesophageal reflux disease)   . Hx of colonic polyps   . Hyperlipidemia   . Hypertension   . Mitral valve disorder    s/p mitral valve repair wth CABG    Patient Active Problem List   Diagnosis Date Noted  . 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 Hospital discharge follow-up 02/09/2015  . Chronic cough 10/21/2014  . Tobacco abuse 10/11/2014  . Tobacco abuse counseling 10/11/2014  . Chronic right hip pain 08/26/2014  . Diarrhea 08/25/2014  . Fracture of clavicular shaft, closed 05/10/2014  . COPD exacerbation ([place]) 06/18/2013  . Atherosclerosis of native arteries of the extremities with intermittent claudication 06/18/2013  . Routine general medical examination at a health care facility 06/18/2013  . Left-sided chest wall pain 06/18/2013  . CAD (coronary artery disease) 06/01/2013  . GERD  (gastroesophageal reflux disease) 06/01/2013  . Essential hypertension, benign 06/01/2013  . Hypercholesterolemia 06/01/2013  . Tubular adenoma of colon 06/01/2013  . Depression 06/01/2013    Past Surgical History:  Procedure Laterality Date  . arm surgery     fracture, has plates and screws  . CABG with mitral valve repair    . COLON SURGERY     colon cancer  . TOTAL ABDOMINAL HYSTERECTOMY  1999   history of abnormal pap    Med list reviewed  Allergies Hydrocodone-acetaminophen  Family History  Problem Relation Age of Onset  . Arthritis Mother   . Cancer Mother     uterus cancer  . Hyperlipidemia Mother   . Hypertension Mother   . Heart disease Mother   . Diabetes Mother   . Hyperlipidemia Father   . Hypertension Father   . Heart disease Father   . Diabetes Father   . Cancer Sister     ovary cancer  . Diabetes Maternal Grandmother   . Hypertension Maternal Grandmother   . Arthritis Maternal Grandmother   . Hypertension Maternal Grandfather   . Hypertension Paternal Grandmother   . Hypertension Paternal Grandfather   . Heart disease Paternal Grandfather     Social History Social History  Substance Use Topics  . Smoking status: Current Every Day Smoker    Types: Cigarettes  . Smokeless tobacco: Never Used     Comment: 1/2-1 ppd  . Alcohol use 1.2 oz/week    2 Shots of liquor per week     Comment: Occasional  Review of Systems  Constitutional: Negative for fever. Cardiovascular: Positive for chest pain. Respiratory: Negative for shortness of breath. Gastrointestinal: Negative for abdominal pain, vomiting and diarrhea. Neurological: Negative for headaches, focal weakness or numbness.   10-point ROS otherwise negative.  ____________________________________________   PHYSICAL EXAM:  VITAL SIGNS: ED Triage Vitals  Enc Vitals Group     BP 06/20/16 2331 (!) 123/96     Pulse Rate 06/20/16 2331 72     Resp 06/20/16 2331 20     Temp 06/20/16  2331 98 F (36.7 C)     Temp Source 06/20/16 2331 Oral     SpO2 06/20/16 2331 93 %     Weight 06/20/16 2336 176 lb (79.8 kg)     Height 06/20/16 2336 5\' 3"  (1.6 m)     Head Circumference --      Peak Flow --      Pain Score 06/20/16 2336 3   Constitutional: Alert and oriented. Well appearing and in no distress. Eyes: Conjunctivae are normal. PERRL. Normal extraocular movements. ENT   Head: Normocephalic and atraumatic.   Nose: No congestion/rhinnorhea.   Mouth/Throat: Mucous membranes are moist.   Neck: No stridor. Hematological/Lymphatic/Immunilogical: No cervical lymphadenopathy. Cardiovascular: Normal rate, regular rhythm.  No murmurs, rubs, or gallops. Respiratory: Normal respiratory effort without tachypnea nor retractions. Breath sounds are clear and equal bilaterally. No wheezes/rales/rhonchi. Gastrointestinal: Soft and nontender. No distention.  Genitourinary: Deferred Musculoskeletal: Normal range of motion in all extremities. No joint effusions.  No lower extremity tenderness nor edema. Neurologic:  Normal speech and language. No gross focal neurologic deficits are appreciated.  Skin:  Skin is warm, dry and intact. No rash noted. Psychiatric: Mood and affect are normal. Speech and behavior are normal. Patient exhibits appropriate insight and judgment.  ____________________________________________    LABS (pertinent positives/negatives)  Labs Reviewed  BASIC METABOLIC PANEL - Abnormal; Notable for the following:       Result Value   CO2 20 (*)    Glucose, Bld 131 (*)    Creatinine, Ser 1.64 (*)    Calcium 8.4 (*)    GFR calc non Af Amer 32 (*)    GFR calc Af Amer 37 (*)    All other components within normal limits  URINALYSIS COMPLETEWITH MICROSCOPIC ([facility] ONLY) - Abnormal; Notable for the following:    Color, Urine STRAW (*)    APPearance CLEAR (*)    Specific Gravity, Urine 1.003 (*)    Hgb urine dipstick 1+ (*)    Bacteria, UA RARE (*)     Squamous Epithelial / LPF 0-5 (*)    All other components within normal limits  CBC  TROPONIN I  CBG MONITORING, ED     ____________________________________________   EKG  I, Nance Pear, attending physician, personally viewed and interpreted this EKG  EKG Time: 2338 Rate: 71 Rhythm: atrial sensed ventricular paced rhythm Axis: right axis deviation Intervals: qtc 462 QRS: Wide ST changes: no st elevation equivalent Impression: abnormal ekg   ____________________________________________    RADIOLOGY  None  ____________________________________________   PROCEDURES  Procedures  ____________________________________________   INITIAL IMPRESSION / ASSESSMENT AND PLAN / ED COURSE  Pertinent labs & imaging results that were available during my care of the patient were reviewed by me and considered in my medical decision making (see chart for details).  Patient with a history of CABG, congestive heart failure presents to the emergency department today after 2 syncopal episodes. Will plan on blood work. Given extensive cardiac  history would anticipate admission.  Clinical Course   Patient blood work without any elevation of the troponin. However she does have elevation of her creatinine. His double what her baseline is. Given the concern for acute kidney injury given her cardiac history and certainly will plan on admission the hospital service. ____________________________________________   FINAL CLINICAL IMPRESSION(S) / ED DIAGNOSES  Final diagnoses:  Syncope, unspecified syncope type  AKI (acute kidney injury) ([place])     Note: This dictation was prepared with Dragon dictation. Any transcriptional errors that result from this process are unintentional    Nance Pear, MD 06/21/16 [id number]

## 2016-06-21 NOTE — Assessment & Plan Note (Signed)
Bilateral ,  Occurring with low BP.  Ordering carotid ultrasound to rule out significant stenosis

## 2016-06-22 LAB — ECHOCARDIOGRAM COMPLETE
Height: 63 in
Weight: 2856 oz

## 2016-06-22 NOTE — Discharge Summary (Signed)
[facility] [place] NAME: Ashley [name]    MR#:  [id number]  DATE OF BIRTH:  October 30, 1951  DATE OF ADMISSION:  06/20/2016 ADMITTING PHYSICIAN: Harrie Foreman, MD  DATE OF DISCHARGE: 06/22/16  PRIMARY CARE PHYSICIAN: [name], MD    ADMISSION DIAGNOSIS:  AKI (acute kidney injury) ([place]) [N17.9] Syncope, unspecified syncope type [R55]  DISCHARGE DIAGNOSIS:  Active Problems:   AKI (acute kidney injury) ([place]) syncope, unspecified  SECONDARY DIAGNOSIS:   Past Medical History:  Diagnosis Date  . CAD (coronary artery disease)    s/p CABG  . Colon cancer ([place])   . Depression   . GERD (gastroesophageal reflux disease)   . Hx of colonic polyps   . Hyperlipidemia   . Hypertension   . Mitral valve disorder    s/p mitral valve repair wth CABG    HOSPITAL COURSE:  Ashley [name]  is a 65 y.o. female admitted 06/20/2016 with chief complaint Loss of Consciousness . Please see H&P performed by Harrie Foreman, MD for further information. Patient presented with the above symptoms. Placed on telemetry and had echocardiogram -- findings below. Patient remained asymptomatic for the duration of her stay     Echo:  Study Conclusions  - Left ventricle: The cavity size was mildly dilated. Systolic   function was normal. The estimated ejection fraction was in the   range of 55% to 60%. Wall motion was normal; there were no   regional wall motion abnormalities. - Aortic valve: There was mild regurgitation. Valve area (VTI): 1.5   cm^2. Valve area (Vmax): 1.74 cm^2. Valve area (Vmean): 1.66   cm^2. - Mitral valve: There was moderate regurgitation. Valve area by   continuity equation (using LVOT flow): 0.9 cm^2. - Left atrium: The atrium was mildly dilated. - Right ventricle: The cavity size was mildly dilated. Wall   thickness was normal.  Impressions:  - Normal Overall LVF   EF=55-60%   Mod MR/Mild MS   Pacer wire in  RV   Mod thickening of Mitral Valve annulous DISCHARGE CONDITIONS:   stable  CONSULTS OBTAINED:  Treatment Team:  Yolonda Kida, MD  DRUG ALLERGIES:   Allergies  Allergen Reactions  . Hydrocodone-Acetaminophen Nausea Only    DISCHARGE MEDICATIONS:   Current Discharge Medication List    CONTINUE these medications which have NOT CHANGED   Details  acetaminophen (TYLENOL) 500 MG tablet Maximum 6 tablets a day. Qty: 42 tablet, Refills: 0    ADVAIR DISKUS 250-50 MCG/DOSE AEPB INHALE 1 PUFF TWICE DAILY-RINSE MOUTH AFTER EACH USE_ Qty: 180 each, Refills: 2    albuterol (PROVENTIL HFA;VENTOLIN HFA) 108 (90 BASE) MCG/ACT inhaler Inhale 2 puffs into the lungs every 6 (six) hours as needed for wheezing.    aspirin 81 MG tablet Take 81 mg by mouth daily.    atorvastatin (LIPITOR) 40 MG tablet Take 1 tablet (40 mg total) by mouth daily. Qty: 90 tablet, Refills: 3   Associated Diagnoses: Hypercholesterolemia    carvedilol (COREG) 6.25 MG tablet TAKE ONE TABLET BY MOUTH TWICE DAILY WITH MEAL Qty: 180 tablet, Refills: 2    clopidogrel (PLAVIX) 75 MG tablet TAKE ONE TABLET BY MOUTH EVERY DAY Qty: 90 tablet, Refills: 2    escitalopram (LEXAPRO) 10 MG tablet TAKE ONE TABLET BY MOUTH EVERY DAY Qty: 90 tablet, Refills: 2    furosemide (LASIX) 20 MG tablet TAKE ONE TABLET BY MOUTH EVERY DAY Qty: 90 tablet, Refills: 2  guaiFENesin-codeine (CHERATUSSIN AC) 100-10 MG/5ML syrup Take 5 mLs by mouth 3 (three) times daily as needed for cough. Qty: 120 mL, Refills: 0    lisinopril (PRINIVIL,ZESTRIL) 20 MG tablet TAKE ONE TABLET BY MOUTH EVERY DAY Qty: 90 tablet, Refills: 2    nitroGLYCERIN (NITROSTAT) 0.4 MG SL tablet Place 1 tablet (0.4 mg total) under the tongue every 5 (five) minutes as needed for chest pain. Qty: 30 tablet, Refills: 3    omeprazole (PRILOSEC) 40 MG capsule TAKE ONE CAPSULE BY MOUTH EVERY DAY Qty: 90 capsule, Refills: 2    Tiotropium Bromide Monohydrate  (SPIRIVA RESPIMAT) 2.5 MCG/ACT AERS INHALE TWO SPRAYS BY MOUTH ONCE DAILY Qty: 4 g, Refills: 6         DISCHARGE INSTRUCTIONS:    DIET:  Cardiac diet  DISCHARGE CONDITION:  Stable  ACTIVITY:  Activity as tolerated  OXYGEN:  Home Oxygen: No.   Oxygen Delivery: room air  DISCHARGE LOCATION:  home   If you experience worsening of your admission symptoms, develop shortness of breath, life threatening emergency, suicidal or homicidal thoughts you must seek medical attention immediately by calling 911 or calling your MD immediately  if symptoms less severe.  You Must read complete instructions/literature along with all the possible adverse reactions/side effects for all the Medicines you take and that have been prescribed to you. Take any new Medicines after you have completely understood and accpet all the possible adverse reactions/side effects.   Please note  You were cared for by a hospitalist during your hospital stay. If you have any questions about your discharge medications or the care you received while you were in the hospital after you are discharged, you can call the unit and asked to speak with the hospitalist on call if the hospitalist that took care of you is not available. Once you are discharged, your primary care physician will handle any further medical issues. Please note that NO REFILLS for any discharge medications will be authorized once you are discharged, as it is imperative that you return to your primary care physician (or establish a relationship with a primary care physician if you do not have one) for your aftercare needs so that they can reassess your need for medications and monitor your lab values.    On the day of Discharge:   VITAL SIGNS:  Blood pressure (!) 153/76, pulse 61, temperature 97.6 F (36.4 C), temperature source Oral, resp. rate 17, height 5\' 3"  (1.6 m), weight 80 kg (176 lb 5.9 oz), SpO2 97 %.  I/O:   Intake/Output Summary (Last  24 hours) at 06/22/16 1158 Last data filed at 06/22/16 0900  Gross per 24 hour  Intake              960 ml  Output             2850 ml  Net            -1890 ml    PHYSICAL EXAMINATION:  GENERAL:  65 y.o.-year-old patient lying in the bed with no acute distress.  EYES: Pupils equal, round, reactive to light and accommodation. No scleral icterus. Extraocular muscles intact.  HEENT: Head atraumatic, normocephalic. Oropharynx and nasopharynx clear.  NECK:  Supple, no jugular venous distention. No thyroid enlargement, no tenderness.  LUNGS: Normal breath sounds bilaterally, no wheezing, rales,rhonchi or crepitation. No use of accessory muscles of respiration.  CARDIOVASCULAR: S1, S2 normal. 3/6 systolic ejection murmur, rubs, or gallops.  ABDOMEN: Soft, non-tender, non-distended. Bowel sounds  present. No organomegaly or mass.  EXTREMITIES: No pedal edema, cyanosis, or clubbing.  NEUROLOGIC: Cranial nerves II through XII are intact. Muscle strength 5/5 in all extremities. Sensation intact. Gait not checked.  PSYCHIATRIC: The patient is alert and oriented x 3.  SKIN: No obvious rash, lesion, or ulcer.   DATA REVIEW:   CBC  Recent Labs Lab 06/20/16 2348  WBC 10.5  HGB 12.6  HCT 36.4  PLT 228    Chemistries   Recent Labs Lab 06/21/16 0450  NA 140  K 3.8  CL 110  CO2 21*  GLUCOSE 115*  BUN 18  CREATININE 1.32*  CALCIUM 8.6*    Cardiac Enzymes  Recent Labs Lab 06/21/16 0450  TROPONINI <0.03    Microbiology Results  No results found for this or any previous visit.  RADIOLOGY:  No results found.   Management plans discussed with the patient, family and they are in agreement.  CODE STATUS:     Code Status Orders        Start     Ordered   06/21/16 0438  Full code  Continuous     06/21/16 0437    Code Status History    Date Active Date Inactive Code Status Order ID Comments User Context   06/21/2016  4:37 AM 06/21/2016  5:58 PM Full Code [id number]   Harrie Foreman, MD Inpatient      TOTAL TIME TAKING CARE OF THIS PATIENT: 32 minutes.    Hower,  Karenann Cai.D on 06/22/2016 at 11:58 AM  Between 7am to 6pm - Pager - [phone]  After 6pm go to [email or web address] - Proofreader  [facility]  Office  [phone]  CC: Primary care physician; [name], MD

## 2016-06-22 NOTE — Progress Notes (Signed)
Patient discharged to home.  IV and telemetry discontinued and removed. Discharge instructions given Patient instructed to follow up with MD in 1 week Pt instructed to monitor BP and take doses as ordered, and to notify her primary MD if BP is elevated at home. Pt left floor via wheelchair by volunteer

## 2016-06-23 ENCOUNTER — Telehealth: Payer: Self-pay

## 2016-06-23 NOTE — Telephone Encounter (Signed)
Transition Care Management Follow-up Telephone Call   Date discharged? 06/22/16   How have you been since you were released from the hospital? [place].  HEADACHE WHEN HIGH, SUBSIDES WITH TYLENOL.  DIZZY WHEN LOW.   TODAY'S HIGH 160/103, LOW 110/67.  NO PAIN. EATING/DRINKING WITHOUT ISSUES.      Do you understand why you were in the hospital? YES, AKI AND SYNCOPE.   Do you understand the discharge instructions? YES, MOVE SLOWLY BETWEEN ACTIVITIES,  INCREASING ACTIVITIES AS TOLERATED, FOLLOW UP WITH PCP.     Where were you discharged to? Home.   Items Reviewed:  Medications reviewed:  YES, TAKING ALL SCHEDULED MEDICATIONS, WHICH HAVE NOT CHANGED, AS DIRECTED.  Allergies reviewed: YES,  HYDROCODONE-ACETAMINOPHEN.  Dietary changes reviewed: YES, CARDIAC DIET.  Referrals reviewed: YES, FOLLOW UP WITH PCP.   Functional Questionnaire:   Activities of Daily Living (ADLs):   She states they are independent in the following: Independent in all ADLs. States they require assistance with the following: Does not require assistance at this time.    Any transportation issues/concerns? NO.   Any patient concerns? CONTINUED FLUCTUATING BLOOD PRESSURE.   Confirmed importance and date/time of follow-up visits scheduled YES,  appointment to be scheduled 06/28/16 at 11:30.  Provider Appointment booked with Dr. Derrel Nip (PCP).  Confirmed with patient if condition begins to worsen call PCP or go to the ER.  Patient was given the office number and encouraged to call back with question or concerns.  : YES, PATIENT VERBALIZED UNDERSTANDING.

## 2016-06-26 NOTE — Telephone Encounter (Signed)
Mailed unread message to patient, thanks 

## 2016-06-28 ENCOUNTER — Ambulatory Visit (INDEPENDENT_AMBULATORY_CARE_PROVIDER_SITE_OTHER): Payer: Medicare Other | Admitting: Internal Medicine

## 2016-06-28 VITALS — BP 145/84 | HR 69 | Temp 98.1°F | Resp 18

## 2016-06-28 DIAGNOSIS — R200 Anesthesia of skin: Secondary | ICD-10-CM

## 2016-06-28 DIAGNOSIS — G459 Transient cerebral ischemic attack, unspecified: Secondary | ICD-10-CM

## 2016-06-28 DIAGNOSIS — R208 Other disturbances of skin sensation: Secondary | ICD-10-CM | POA: Diagnosis not present

## 2016-06-28 DIAGNOSIS — Z5189 Encounter for other specified aftercare: Secondary | ICD-10-CM | POA: Diagnosis not present

## 2016-06-28 DIAGNOSIS — Z23 Encounter for immunization: Secondary | ICD-10-CM

## 2016-06-28 DIAGNOSIS — N178 Other acute kidney failure: Secondary | ICD-10-CM

## 2016-06-28 DIAGNOSIS — Z09 Encounter for follow-up examination after completed treatment for conditions other than malignant neoplasm: Secondary | ICD-10-CM

## 2016-06-28 DIAGNOSIS — N179 Acute kidney failure, unspecified: Secondary | ICD-10-CM

## 2016-06-28 DIAGNOSIS — I10 Essential (primary) hypertension: Secondary | ICD-10-CM | POA: Diagnosis not present

## 2016-06-28 NOTE — Patient Instructions (Addendum)
Take the lisinopril at bedtime instead of the morning  continue coreg and furosemide  As  You are doing  MRI/MRA brain,  Carotid dopplers and renal artery ultrasound have been ordered. To evaluate the  blood flow to brain and kidneys

## 2016-06-28 NOTE — Progress Notes (Signed)
Subjective:  Patient ID: Ashley [name], female    DOB: 1951-04-16  Age: 65 y.o. MRN: [id number]  CC: The primary encounter diagnosis was Acute renal failure with other specified pathological lesion in kidney [facility]). Diagnoses of Encounter for immunization, Transient cerebral ischemia, unspecified transient cerebral ischemia type, Essential hypertension, benign, Facial numbness, Hospital discharge follow-up, and AKI (acute kidney injury) ([place]) were also pertinent to this visit.  HPI Ashley [name] presents for hospital  follow up.She was admitted to [facility] on August 15 and discharged august 17th after having a witnessed syncopal event  x 2 . She Ruled out for AMI and was observed on telemtry for 48 hours without arrhythmia. She was diagnosed with acute kidney injury (ctr rose to 1.63 ,  From 0.8,  Was 1.3 at discharge)  and syncope which were attributed to dehydration , and was treated with IV hydration.  she was discharged on a reduced dose of lisinopril,  Cr was not back to baseline at time of discharge.   Patient denies any preceding events that would have resulted in dehydration as the cause for her symptoms.   Patient had been seen twice in the 10 days prior to admission with uncontrolled hypertension. She did not tolerate the medication changes and her dose of  coreg was reduced to 6.25 bid , but her lisinopril was continued at the increased dose of 40 mg bid.  At discharge from [facility] on August 17,  dose was reduced to 20 mg daily   BP at home still elevated 180/105 in the am AND  drops to as low as 109,  114/65  By early afternoon,  Feels light headed and face goes numb for up to several hours     Needs to rule out RAS  amd CVD  (    Outpatient Medications Prior to Visit  Medication Sig Dispense Refill  . acetaminophen (TYLENOL) 500 MG tablet Maximum 6 tablets a day. (Patient taking differently: Take 500 mg by mouth every 4 (four) hours as needed for fever. Maximum 6 tablets a day.) 42  tablet 0  . ADVAIR DISKUS 250-50 MCG/DOSE AEPB INHALE 1 PUFF TWICE DAILY  180 each 2  . albuterol (PROVENTIL HFA;VENTOLIN HFA) 108 (90 BASE) MCG/ACT inhaler Inhale 2 puffs into the lungs every 6 (six) hours as needed for wheezing.    [name] aspirin 81 MG tablet Take 81 mg by mouth daily.    [name] atorvastatin (LIPITOR) 40 MG tablet Take 1 tablet (40 mg total) by mouth daily. 90 tablet 3  . carvedilol (COREG) 6.25 MG tablet TAKE ONE TABLET BY MOUTH TWICE DAILY WITH MEAL 180 tablet 2  . clopidogrel (PLAVIX) 75 MG tablet TAKE ONE TABLET BY MOUTH EVERY DAY 90 tablet 2  . escitalopram (LEXAPRO) 10 MG tablet TAKE ONE TABLET BY MOUTH EVERY DAY 90 tablet 2  . furosemide (LASIX) 20 MG tablet TAKE ONE TABLET BY MOUTH EVERY DAY 90 tablet 2  . guaiFENesin-codeine (CHERATUSSIN AC) 100-10 MG/5ML syrup Take 5 mLs by mouth 3 (three) times daily as needed for cough. 120 mL 0  . lisinopril (PRINIVIL,ZESTRIL) 20 MG tablet TAKE ONE TABLET BY MOUTH EVERY DAY 90 tablet 2  . nitroGLYCERIN (NITROSTAT) 0.4 MG SL tablet Place 1 tablet (0.4 mg total) under the tongue every 5 (five) minutes as needed for chest pain. 30 tablet 3  . omeprazole (PRILOSEC) 40 MG capsule TAKE ONE CAPSULE BY MOUTH EVERY DAY 90 capsule 2  . Tiotropium Bromide Monohydrate (SPIRIVA RESPIMAT)  2.5 MCG/ACT AERS INHALE TWO SPRAYS BY MOUTH ONCE DAILY 4 g 6   No facility-administered medications prior to visit.     Review of Systems;  Patient denies headache, fevers, malaise, unintentional weight loss, skin rash, eye pain, sinus congestion and sinus pain, sore throat, dysphagia,  hemoptysis , cough, dyspnea, wheezing, chest pain, palpitations, orthopnea, edema, abdominal pain, nausea, melena, diarrhea, constipation, flank pain, dysuria, hematuria, urinary  Frequency, nocturia, numbness, tingling, seizures,  Focal weakness, Loss of consciousness,  Tremor, insomnia, depression, anxiety, and suicidal ideation.      Objective:  BP (!) 145/84 (BP Location: Left  Arm, Patient Position: Sitting)   Pulse 69   Temp 98.1 F (36.7 C) (Oral)   Resp 18   SpO2 96%   BP Readings from Last 3 Encounters:  06/28/16 (!) 145/84  06/22/16 (!) 153/76  06/19/16 120/72    Wt Readings from Last 3 Encounters:  06/22/16 176 lb 5.9 oz (80 kg)  06/19/16 176 lb (79.8 kg)  06/06/16 174 lb 8 oz (79.2 kg)    General appearance: alert, cooperative and appears stated age Ears: normal TM's and external ear canals both ears Throat: lips, mucosa, and tongue normal; teeth and gums normal Neck: no adenopathy, no carotid bruit, supple, symmetrical, trachea midline and thyroid not enlarged, symmetric, no tenderness/mass/nodules Back: symmetric, no curvature. ROM normal. No CVA tenderness. Lungs: clear to auscultation bilaterally Heart: regular rate and rhythm, S1, S2 normal, no murmur, click, rub or gallop Abdomen: soft, non-tender; bowel sounds normal; no masses,  no organomegaly Pulses: 2+ and symmetric Skin: Skin color, texture, turgor normal. No rashes or lesions Lymph nodes: Cervical, supraclavicular, and axillary nodes normal.  Lab Results  Component Value Date   HGBA1C 5.7 06/21/2016    Lab Results  Component Value Date   CREATININE 0.97 06/28/2016   CREATININE 1.32 (H) 06/21/2016   CREATININE 1.64 (H) 06/20/2016    Lab Results  Component Value Date   WBC 10.5 06/20/2016   HGB 12.6 06/20/2016   HCT 36.4 06/20/2016   PLT 228 06/20/2016   GLUCOSE 98 06/28/2016   CHOL 229 (H) 06/06/2016   TRIG (H) 06/06/2016    477.0 Triglyceride is over 400; calculations on Lipids are invalid.   HDL 35.30 (L) 06/06/2016   LDLDIRECT 155.0 06/06/2016   LDLCALC 90 10/09/2014   ALT 37 (H) 06/06/2016   AST 31 06/06/2016   NA 141 06/28/2016   K 3.8 06/28/2016   CL 107 06/28/2016   CREATININE 0.97 06/28/2016   BUN 17 06/28/2016   CO2 25 06/28/2016   TSH 2.301 06/21/2016   INR 1.1 08/19/2014   HGBA1C 5.7 06/21/2016    No results found.  Assessment & Plan:    Problem List Items Addressed This Visit    Essential hypertension, benign    Lisinopril dose was reduced  during admission for syncope with acuter renal failure.need ot rule out secondary causes of HTN , Renal artery ultrasound ordered      Hospital discharge follow-up    All labs , imaging studies and progress notes from admission were reviewed with patient today hospital follow up. Patient is stable post discharge and has no new issues or questions about discharge plans at the visit today for hospital follow up.        AKI (acute kidney injury) ([place])    Etiology unclear .  Need to rule out RAS  Cr is back to baseline with lower dose of lisinopril since dscharge.  Lab Results  Component Value Date   CREATININE 0.97 06/28/2016   Lab Results  Component Value Date   NA 141 06/28/2016   K 3.8 06/28/2016   CL 107 06/28/2016   CO2 25 06/28/2016         Facial numbness    Recurrent with drops in blood pressure to normal range.  Given her PAD and CAD,  Need to rule out severe CVD with carotid dopplers and MRI/MRA       Other Visit Diagnoses    Acute renal failure with other specified pathological lesion in kidney [facility])    -  Primary   Relevant Orders   Basic metabolic panel (Completed)   VAS US RENAL ARTERY DUPLEX   Encounter for immunization       Relevant Orders   Flu Vaccine QUAD 36+ mos IM (Completed)   Transient cerebral ischemia, unspecified transient cerebral ischemia type       Relevant Orders   MR MRA Head/Brain Wo Cm   US Carotid Duplex Bilateral      I am having Ms. Satter maintain her aspirin, albuterol, atorvastatin, acetaminophen, ADVAIR DISKUS, lisinopril, carvedilol, clopidogrel, furosemide, omeprazole, escitalopram, Tiotropium Bromide Monohydrate, guaiFENesin-codeine, and nitroGLYCERIN.  No orders of the defined types were placed in this encounter.   There are no discontinued medications.  Follow-up: No Follow-up on file.   [name], MD

## 2016-06-28 NOTE — Progress Notes (Signed)
Pre-visit discussion using our clinic review tool. No additional management support is needed unless otherwise documented below in the visit note.  

## 2016-06-29 LAB — BASIC METABOLIC PANEL
BUN: 17 mg/dL (ref 6–23)
CHLORIDE: 107 meq/L (ref 96–112)
CO2: 25 mEq/L (ref 19–32)
CREATININE: 0.97 mg/dL (ref 0.40–1.20)
Calcium: 9.2 mg/dL (ref 8.4–10.5)
GFR: 61.61 mL/min (ref >60.00)
Glucose, Bld: 98 mg/dL (ref 70–99)
POTASSIUM: 3.8 meq/L (ref 3.5–5.1)
Sodium: 141 mEq/L (ref 135–145)

## 2016-06-30 ENCOUNTER — Encounter: Payer: Self-pay | Admitting: Internal Medicine

## 2016-06-30 NOTE — Assessment & Plan Note (Signed)
All labs , imaging studies and progress notes from admission were reviewed with patient today hospital follow up. Patient is stable post discharge and has no new issues or questions about discharge plans at the visit today for hospital follow up.

## 2016-06-30 NOTE — Assessment & Plan Note (Signed)
Recurrent with drops in blood pressure to normal range.  Given her PAD and CAD,  Need to rule out severe CVD with carotid dopplers and MRI/MRA

## 2016-06-30 NOTE — Assessment & Plan Note (Signed)
Lisinopril dose was reduced  during admission for syncope with acuter renal failure.need ot rule out secondary causes of HTN , Renal artery ultrasound ordered

## 2016-06-30 NOTE — Assessment & Plan Note (Signed)
Etiology unclear .  Need to rule out RAS  Cr is back to baseline with lower dose of lisinopril since dscharge.  Lab Results  Component Value Date   CREATININE 0.97 06/28/2016   Lab Results  Component Value Date   NA 141 06/28/2016   K 3.8 06/28/2016   CL 107 06/28/2016   CO2 25 06/28/2016

## 2016-07-04 ENCOUNTER — Other Ambulatory Visit: Payer: Self-pay | Admitting: Internal Medicine

## 2016-07-04 ENCOUNTER — Ambulatory Visit
Admission: RE | Admit: 2016-07-04 | Discharge: 2016-07-04 | Disposition: A | Discharge status: Home / Self Care | Payer: Medicare Other | Source: Ambulatory Visit | Attending: Internal Medicine | Admitting: Internal Medicine

## 2016-07-04 DIAGNOSIS — I6523 Occlusion and stenosis of bilateral carotid arteries: Secondary | ICD-10-CM | POA: Diagnosis not present

## 2016-07-04 DIAGNOSIS — G459 Transient cerebral ischemic attack, unspecified: Secondary | ICD-10-CM | POA: Insufficient documentation

## 2016-07-06 ENCOUNTER — Encounter: Payer: Self-pay | Admitting: Internal Medicine

## 2016-07-11 ENCOUNTER — Other Ambulatory Visit: Payer: Self-pay | Admitting: Internal Medicine

## 2016-07-11 DIAGNOSIS — I10 Essential (primary) hypertension: Secondary | ICD-10-CM

## 2016-07-11 DIAGNOSIS — N178 Other acute kidney failure: Secondary | ICD-10-CM

## 2016-07-17 ENCOUNTER — Ambulatory Visit: Payer: Medicare Other

## 2016-07-17 DIAGNOSIS — I10 Essential (primary) hypertension: Secondary | ICD-10-CM | POA: Diagnosis not present

## 2016-07-17 DIAGNOSIS — I701 Atherosclerosis of renal artery: Secondary | ICD-10-CM

## 2016-07-17 DIAGNOSIS — N178 Other acute kidney failure: Secondary | ICD-10-CM

## 2016-07-18 ENCOUNTER — Encounter: Payer: Self-pay | Admitting: Internal Medicine

## 2016-08-01 DIAGNOSIS — Z95810 Presence of automatic (implantable) cardiac defibrillator: Secondary | ICD-10-CM | POA: Diagnosis not present

## 2016-08-01 DIAGNOSIS — I739 Peripheral vascular disease, unspecified: Secondary | ICD-10-CM | POA: Diagnosis not present

## 2016-08-01 DIAGNOSIS — K219 Gastro-esophageal reflux disease without esophagitis: Secondary | ICD-10-CM | POA: Diagnosis not present

## 2016-08-01 DIAGNOSIS — Z951 Presence of aortocoronary bypass graft: Secondary | ICD-10-CM | POA: Diagnosis not present

## 2016-08-01 DIAGNOSIS — E785 Hyperlipidemia, unspecified: Secondary | ICD-10-CM | POA: Diagnosis not present

## 2016-08-01 DIAGNOSIS — I252 Old myocardial infarction: Secondary | ICD-10-CM | POA: Diagnosis not present

## 2016-08-01 DIAGNOSIS — I110 Hypertensive heart disease with heart failure: Secondary | ICD-10-CM | POA: Diagnosis not present

## 2016-08-01 DIAGNOSIS — I5022 Chronic systolic (congestive) heart failure: Secondary | ICD-10-CM | POA: Diagnosis not present

## 2016-08-01 DIAGNOSIS — Z4502 Encounter for adjustment and management of automatic implantable cardiac defibrillator: Secondary | ICD-10-CM | POA: Diagnosis not present

## 2016-08-01 DIAGNOSIS — Z7982 Long term (current) use of aspirin: Secondary | ICD-10-CM | POA: Diagnosis not present

## 2016-09-13 ENCOUNTER — Ambulatory Visit (INDEPENDENT_AMBULATORY_CARE_PROVIDER_SITE_OTHER): Payer: Medicare Other

## 2016-09-13 VITALS — BP 128/82 | HR 79 | Temp 97.7°F | Resp 14 | Ht 65.0 in | Wt 177.8 lb

## 2016-09-13 DIAGNOSIS — Z0000 Encounter for general adult medical examination without abnormal findings: Secondary | ICD-10-CM | POA: Diagnosis not present

## 2016-09-13 DIAGNOSIS — Z85038 Personal history of other malignant neoplasm of large intestine: Secondary | ICD-10-CM

## 2016-09-13 DIAGNOSIS — Z1239 Encounter for other screening for malignant neoplasm of breast: Secondary | ICD-10-CM

## 2016-09-13 DIAGNOSIS — Z1231 Encounter for screening mammogram for malignant neoplasm of breast: Secondary | ICD-10-CM | POA: Diagnosis not present

## 2016-09-13 NOTE — Patient Instructions (Addendum)
Ashley [name] , Thank you for taking time to come for your Medicare Wellness Visit. I appreciate your ongoing commitment to your health goals. Please review the following plan we discussed and let me know if I can assist you in the future.   RETURN FOR FOLLOW UP APPOINTMENT WITH DR. Derrel Nip  MAMMOGRAM AND COLONOSCOPY AS DIRECTED.    Pap Smear; follow up with GYN/PCP.  These are the goals we discussed: Goals    . Increase physical activity          Chair exercises as demonstrated.  Complete in sets.  Increase as tolerated.       This is a list of the screening recommended for you and due dates:  Health Maintenance  Topic Date Due  .  Hepatitis C: One time screening is recommended by Center for Disease Control  (CDC) for  adults born from [birth year] through 1963.   1951/11/03  . HIV Screening  04/26/1968  . Pap Smear  04/26/1974  . Mammogram  04/27/2003  . Colon Cancer Screening  04/27/2003  . Shingles Vaccine  09/13/2017*  . Tetanus Vaccine  09/13/2017*  . Flu Shot  Completed  *Topic was postponed. The date shown is not the original due date.    Colonoscopy A colonoscopy is an exam to look at the entire large intestine (colon). This exam can help find problems such as tumors, polyps, inflammation, and areas of bleeding. The exam takes about 1 hour.  LET [facility] CARE PROVIDER KNOW ABOUT:   Any allergies you have.  All medicines you are taking, including vitamins, herbs, eye drops, creams, and over-the-counter medicines.  Previous problems you or members of your family have had with the use of anesthetics.  Any blood disorders you have.  Previous surgeries you have had.  Medical conditions you have. RISKS AND COMPLICATIONS  Generally, this is a safe procedure. However, as with any procedure, complications can occur. Possible complications include:  Bleeding.  Tearing or rupture of the colon wall.  Reaction to medicines given during the exam.  Infection (rare). BEFORE  THE PROCEDURE   Ask your health care provider about changing or stopping your regular medicines.  You may be prescribed an oral bowel prep. This involves drinking a large amount of medicated liquid, starting the day before your procedure. The liquid will cause you to have multiple loose stools until your stool is almost clear or light green. This cleans out your colon in preparation for the procedure.  Do not eat or drink anything else once you have started the bowel prep, unless your health care provider tells you it is safe to do so.  Arrange for someone to drive you home after the procedure. PROCEDURE   You will be given medicine to help you relax (sedative).  You will lie on your side with your knees bent.  A long, flexible tube with a light and camera on the end (colonoscope) will be inserted through the rectum and into the colon. The camera sends video back to a computer screen as it moves through the colon. The colonoscope also releases carbon dioxide gas to inflate the colon. This helps your health care provider see the area better.  During the exam, your health care provider may take a small tissue sample (biopsy) to be examined under a microscope if any abnormalities are found.  The exam is finished when the entire colon has been viewed. AFTER THE PROCEDURE   Do not drive for 24 hours after the  exam.  You may have a small amount of blood in your stool.  You may pass moderate amounts of gas and have mild abdominal cramping or bloating. This is caused by the gas used to inflate your colon during the exam.  Ask when your test results will be ready and how you will get your results. Make sure you get your test results.   This information is not intended to replace advice given to you by your health care provider. Make sure you discuss any questions you have with your health care provider.   Document Released: 10/20/2000 Document Revised: 08/13/2013 Document Reviewed:  06/30/2013 Elsevier Interactive Patient Education 2016 [place] A mammogram is an X-ray of the breasts that is done to check for abnormal changes. This procedure can screen for and detect any changes that may suggest breast cancer. A mammogram can also identify other changes and variations in the breast, such as:  Inflammation of the breast tissue (mastitis).  An infected area that contains a collection of pus (abscess).  A fluid-filled sac (cyst).  Fibrocystic changes. This is when breast tissue becomes denser, which can make the tissue feel rope-like or uneven under the skin.  Tumors that are not cancerous (benign). LET [facility] CARE PROVIDER KNOW ABOUT:  Any allergies you have.  If you have breast implants.  If you have had previous breast disease, biopsy, or surgery.  If you are breastfeeding.  Any possibility that you could be pregnant, if this applies.  If you are younger than age 10.  If you have a family history of breast cancer. RISKS AND COMPLICATIONS Generally, this is a safe procedure. However, problems may occur, including:  Exposure to radiation. Radiation levels are very low with this test.  The results being misinterpreted.  The need for further tests.  The inability of the mammogram to detect certain cancers. BEFORE THE PROCEDURE  Schedule your test about 1-2 weeks after your menstrual period. This is usually when your breasts are the least tender.  If you have had a mammogram done at a different facility in the past, get the mammogram X-rays or have them sent to your current exam facility in order to compare them.  Wash your breasts and under your arms the day of the test.  Do not wear deodorants, perfumes, lotions, or powders anywhere on your body on the day of the test.  Remove any jewelry from your neck.  Wear clothes that you can change into and out of easily. PROCEDURE  You will undress from the waist up and put on a  gown.  You will stand in front of the X-ray machine.  Each breast will be placed between two plastic or glass plates. The plates will compress your breast for a few seconds. Try to stay as relaxed as possible during the procedure. This does not cause any harm to your breasts and any discomfort you feel will be very brief.  X-rays will be taken from different angles of each breast. The procedure may vary among health care providers and hospitals. AFTER THE PROCEDURE  The mammogram will be examined by a specialist (radiologist).  You may need to repeat certain parts of the test, depending on the quality of the images. This is commonly done if the radiologist needs a better view of the breast tissue.  Ask when your test results will be ready. Make sure you get your test results.  You may resume your normal activities.   This information  is not intended to replace advice given to you by your health care provider. Make sure you discuss any questions you have with your health care provider.   Document Released: 10/20/2000 Document Revised: 07/14/2015 Document Reviewed: 01/01/2015 Elsevier Interactive Patient Education 2016 [employer].     Steps to Quit Smoking  Smoking tobacco can be harmful to your health and can affect almost every organ in your body. Smoking puts you, and those around you, at risk for developing many serious chronic diseases. Quitting smoking is difficult, but it is one of the best things that you can do for your health. It is never too late to quit. WHAT ARE THE BENEFITS OF QUITTING SMOKING? When you quit smoking, you lower your risk of developing serious diseases and conditions, such as:  Lung cancer or lung disease, such as COPD.  Heart disease.  Stroke.  Heart attack.  Infertility.  Osteoporosis and bone fractures. Additionally, symptoms such as coughing, wheezing, and shortness of breath may get better when you quit. You may also find that you get sick  less often because your body is stronger at fighting off colds and infections. If you are pregnant, quitting smoking can help to reduce your chances of having a baby of low birth weight. HOW DO I GET READY TO QUIT? When you decide to quit smoking, create a plan to make sure that you are successful. Before you quit:  Pick a date to quit. Set a date within the next two weeks to give you time to prepare.  Write down the reasons why you are quitting. Keep this list in places where you will see it often, such as on your bathroom mirror or in your car or wallet.  Identify the people, places, things, and activities that make you want to smoke (triggers) and avoid them. Make sure to take these actions:  Throw away all cigarettes at home, at work, and in your car.  Throw away smoking accessories, such as Scientist, research (medical).  Clean your car and make sure to empty the ashtray.  Clean your home, including curtains and carpets.  Tell your family, friends, and coworkers that you are quitting. Support from your loved ones can make quitting easier.  Talk with your health care provider about your options for quitting smoking.  Find out what treatment options are covered by your health insurance. WHAT STRATEGIES CAN I USE TO QUIT SMOKING?  Talk with your healthcare provider about different strategies to quit smoking. Some strategies include:  Quitting smoking altogether instead of gradually lessening how much you smoke over a period of time. Research shows that quitting "cold [place]" is more successful than gradually quitting.  Attending in-person counseling to help you build problem-solving skills. You are more likely to have success in quitting if you attend several counseling sessions. Even short sessions of 10 minutes can be effective.  Finding resources and support systems that can help you to quit smoking and remain smoke-free after you quit. These resources are most helpful when you use them  often. They can include:  Online chats with a Social worker.  Telephone quitlines.  Printed Furniture conservator/restorer.  Support groups or group counseling.  Text messaging programs.  Mobile phone applications.  Taking medicines to help you quit smoking. (If you are pregnant or breastfeeding, talk with your health care provider first.) Some medicines contain nicotine and some do not. Both types of medicines help with cravings, but the medicines that include nicotine help to relieve withdrawal symptoms. Your  health care provider may recommend:  Nicotine patches, gum, or lozenges.  Nicotine inhalers or sprays.  Non-nicotine medicine that is taken by mouth. Talk with your health care provider about combining strategies, such as taking medicines while you are also receiving in-person counseling. Using these two strategies together makes you more likely to succeed in quitting than if you used either strategy on its own. If you are pregnant or breastfeeding, talk with your health care provider about finding counseling or other support strategies to quit smoking. Do not take medicine to help you quit smoking unless told to do so by your health care provider. WHAT THINGS CAN I DO TO MAKE IT EASIER TO QUIT? Quitting smoking might feel overwhelming at first, but there is a lot that you can do to make it easier. Take these important actions:  Reach out to your family and friends and ask that they support and encourage you during this time. Call telephone quitlines, reach out to support groups, or work with a counselor for support.  Ask people who smoke to avoid smoking around you.  Avoid places that trigger you to smoke, such as bars, parties, or smoke-break areas at work.  Spend time around people who do not smoke.  Lessen stress in your life, because stress can be a smoking trigger for some people. To lessen stress, try:  Exercising regularly.  Deep-breathing  exercises.  Yoga.  Meditating.  Performing a body scan. This involves closing your eyes, scanning your body from head to toe, and noticing which parts of your body are particularly tense. Purposefully relax the muscles in those areas.  Download or purchase mobile phone or tablet apps (applications) that can help you stick to your quit plan by providing reminders, tips, and encouragement. There are many free apps, such as QuitGuide from the [employer] Office manager for Disease Control and Prevention). You can find other support for quitting smoking (smoking cessation) through smokefree.gov and other websites. HOW WILL I FEEL WHEN I QUIT SMOKING? Within the first 24 hours of quitting smoking, you may start to feel some withdrawal symptoms. These symptoms are usually most noticeable 2-3 days after quitting, but they usually do not last beyond 2-3 weeks. Changes or symptoms that you might experience include:  Mood swings.  Restlessness, anxiety, or irritation.  Difficulty concentrating.  Dizziness.  Strong cravings for sugary foods in addition to nicotine.  Mild weight gain.  Constipation.  Nausea.  Coughing or a sore throat.  Changes in how your medicines work in your body.  A depressed mood.  Difficulty sleeping (insomnia). After the first 2-3 weeks of quitting, you may start to notice more positive results, such as:  Improved sense of smell and taste.  Decreased coughing and sore throat.  Slower heart rate.  Lower blood pressure.  Clearer skin.  The ability to breathe more easily.  Fewer sick days. Quitting smoking is very challenging for most people. Do not get discouraged if you are not successful the first time. Some people need to make many attempts to quit before they achieve long-term success. Do your best to stick to your quit plan, and talk with your health care provider if you have any questions or concerns.   This information is not intended to replace advice given to  you by your health care provider. Make sure you discuss any questions you have with your health care provider.   Document Released: 10/17/2001 Document Revised: 03/09/2015 Document Reviewed: 03/09/2015 Elsevier Interactive Patient Education [employer].

## 2016-09-13 NOTE — Progress Notes (Signed)
Subjective:   Ashley [name] is a 65 y.o. female who presents for an Initial Medicare Annual Wellness Visit.  Review of Systems    No ROS.  Medicare Wellness Visit.  Cardiac Risk Factors include: advanced age (>68men, >45 women);hypertension     Objective:    Today's Vitals   09/13/16 1354  BP: 128/82  Pulse: 79  Resp: 14  Temp: 97.7 F (36.5 C)  TempSrc: Oral  SpO2: 97%  Weight: 177 lb 12.8 oz (80.6 kg)  Height: 5\' 5"  (1.651 m)   Body mass index is 29.59 kg/m.   Current Medications (verified)   Allergies (verified) Hydrocodone-acetaminophen   History: Past Medical History:  Diagnosis Date  . CAD (coronary artery disease)    s/p CABG  . Colon cancer ([place])   . Depression   . GERD (gastroesophageal reflux disease)   . Hx of colonic polyps   . Hyperlipidemia   . Hypertension   . Mitral valve disorder    s/p mitral valve repair wth CABG   Past Surgical History:  Procedure Laterality Date  . arm surgery     fracture, has plates and screws  . CABG with mitral valve repair    . COLON SURGERY     colon cancer  . pace maker defib  2016  . TOTAL ABDOMINAL HYSTERECTOMY  1999   history of abnormal pap   Family History  Problem Relation Age of Onset  . Arthritis Mother   . Cancer Mother     uterus cancer  . Hyperlipidemia Mother   . Hypertension Mother   . Heart disease Mother   . Diabetes Mother   . Hyperlipidemia Father   . Hypertension Father   . Heart disease Father   . Diabetes Father   . Cancer Sister     ovary cancer  . Diabetes Maternal Grandmother   . Hypertension Maternal Grandmother   . Arthritis Maternal Grandmother   . Hypertension Maternal Grandfather   . Hypertension Paternal Grandmother   . Hypertension Paternal Grandfather   . Heart disease Paternal Grandfather    Social History   Occupational History  . Not on file.   Social History Main Topics  . Smoking status: Current Every Day Smoker    Types: Cigarettes  .  Smokeless tobacco: Never Used     Comment: 1/2-1 ppd  . Alcohol use 1.2 oz/week    2 Shots of liquor per week     Comment: Occasional  . Drug use: No  . Sexual activity: Yes    Tobacco Counseling Ready to quit: Not Answered Counseling given: Not Answered   Activities of Daily Living In your present state of health, do you have any difficulty performing the following activities: 09/13/2016 06/21/2016  Hearing? N N  Vision? Y N  Difficulty concentrating or making decisions? Y N  Walking or climbing stairs? Y N  Dressing or bathing? N N  Doing errands, shopping? N N  Preparing Food and eating ? N -  Using the Toilet? N -  In the past six months, have you accidently leaked urine? Y -  Do you have problems with loss of bowel control? N -  Managing your Medications? N -  Managing your Finances? N -  Housekeeping or managing your Housekeeping? N -  Some recent data might be hidden    Immunizations and Health Maintenance Immunization History  Administered Date(s) Administered  . Influenza Split 08/20/2014  . Influenza,inj,Quad PF,36+ Mos 06/28/2016  . Pneumococcal  Polysaccharide-23 08/20/2014   Health Maintenance Due  Topic Date Due  . Hepatitis C Screening  1950/11/23  . HIV Screening  04/26/1968  . PAP SMEAR  04/26/1974  . MAMMOGRAM  04/27/2003  . COLONOSCOPY  04/27/2003    Patient Care Team: [name], MD as PCP - General (Internal Medicine)  Indicate any recent Medical Services you may have received from other than [facility] providers in the past year (date may be approximate).     Assessment:   This is a routine wellness examination for Ashley [name]. The goal of the wellness visit is to assist the patient how to close the gaps in care and create a preventative care plan for the patient.   Osteoporosis risk reviewed.   Medications reviewed; taking without issues or barriers.  Safety issues reviewed; smoke detectors in the home. No firearms in the home. Wears  seatbelts when driving or riding with others. No violence in the home.  No identified risk were noted; The patient was oriented x 3; appropriate in dress and manner and no objective failures at ADL's or IADL's. She becomes SOB with exertion and moves slowly between activities.  Body mass index; discussed the importance of a healthy diet, water intake and exercise. Educational material provided.  Patient Concerns: Depression; she admits to not wanting to hurt herself or others.  Additional resource information provided.  Continued night sweats; not sleeping well.  Fluctuating blood pressure readings.  Frequent accidental leaking urine.  Follow up scheduled with PCP.  Hearing/Vision screen Hearing Screening Comments: Difficulty hearing a whisper Audiologic testing deferred per patient request. Vision Screening Comments: Wears reading glasses. Vision screening deferred per patient request.  Dietary issues and exercise activities discussed: Current Exercise Habits: The patient does not participate in regular exercise at present  Goals    . Increase physical activity          Chair exercises as demonstrated.  Complete in sets.  Increase as tolerated.      Depression Screen PHQ 2/9 Scores 09/18/2016 09/13/2016  PHQ - 2 Score 6 4  PHQ- 9 Score 16 8    Fall Risk Fall Risk  09/13/2016  Falls in the past year? No    Cognitive Function: MMSE - Mini Mental State Exam 09/13/2016  Orientation to time 5  Orientation to Place 5  Registration 3  Attention/ Calculation 5  Recall 3  Language- name 2 objects 2  Language- repeat 1  Language- follow 3 step command 3  Language- read & follow direction 1  Write a sentence 1  Copy design 1  Total score 30     6CIT Screen 09/13/2016  What Year? 0 points  What month? 0 points  What time? 0 points  Count back from 20 0 points  Months in reverse 0 points    Screening Tests Health Maintenance  Topic Date Due  . Hepatitis C Screening   1951/09/05  . HIV Screening  04/26/1968  . PAP SMEAR  04/26/1974  . MAMMOGRAM  04/27/2003  . COLONOSCOPY  04/27/2003  . ZOSTAVAX  09/13/2017 (Originally 04/26/2013)  . TETANUS/TDAP  09/13/2017 (Originally 04/26/1972)  . INFLUENZA VACCINE  Completed      Plan:   End of life planning; Advance aging; Advanced directives discussed. No HCPOA/Living Will.  Additional information given to help her start the conversation with her family.  Copy of HCPOA/Living Will short forms requested upon completion.  Encouraged to follow up with PCP as needed.  Time spent on this topic  is 24 minutes.    TDAP and ZOSTAVAX vaccine postponed for follow up with insurance.  Hepatitis C screening/HIV screening; discussed.  Deferred for follow up at a later date with PCP per patient request. Educational material provided.  Mammogram and Colonoscopy ordered, follow as directed.  Educational material provided.  Pap Smear; follow up with GYN/PCP.  Medicare Attestation I have personally reviewed: The patient's medical and social history Their use of alcohol, tobacco or illicit drugs Their current medications and supplements The patient's functional ability including ADLs,fall risks, home safety risks, cognitive, and hearing and visual impairment Diet and physical activities Evidence for depression   The patient's weight, height, BMI, and visual acuity have been recorded in the chart.  I have made referrals and provided education to the patient based on review of the above and I have provided the patient with a written personalized care plan for preventive services.    During the course of the visit, Ashley [name] was educated and counseled about the following appropriate screening and preventive services:   Vaccines to include Pneumoccal, Influenza, Hepatitis B, Td, Zostavax, HCV  Electrocardiogram  Cardiovascular disease screening  Colorectal cancer screening  Bone density screening  Diabetes  screening  Glaucoma screening  Mammography/PAP  Nutrition counseling  Smoking cessation counseling  Patient Instructions (the written plan) were given to the patient.    Varney Biles, LPN   [id number]

## 2016-09-15 ENCOUNTER — Telehealth: Payer: Self-pay

## 2016-09-15 ENCOUNTER — Other Ambulatory Visit: Payer: Self-pay

## 2016-09-15 NOTE — Telephone Encounter (Signed)
Screening Colonoscopy Z12.11 [facility] 10/09/2016 Dr. Vicente Males Medicare  Pre cert is not required

## 2016-09-15 NOTE — Telephone Encounter (Signed)
Gastroenterology Pre-Procedure Review  Request Date: 10/09/2016 Requesting Physician: Dr. Derrel Nip  PATIENT REVIEW QUESTIONS: The patient responded to the following health history questions as indicated:    1. Are you having any GI issues? yes (Diarrhea off and on) 2. Do you have a personal history of Polyps? no 3. Do you have a family history of Colon Cancer or Polyps? no 4. Diabetes Mellitus? no 5. Joint replacements in the past 12 months?no 6. Major health problems in the past 3 months?no 7. Any artificial heart valves, MVP, or defibrillator?yes (Pacemaker defibrillator )    MEDICATIONS & ALLERGIES:    Patient reports the following regarding taking any anticoagulation/antiplatelet therapy:   Plavix, Coumadin, Eliquis, Xarelto, Lovenox, Pradaxa, Brilinta, or Effient? yes (Plavix) Aspirin? yes (Blood thinner )  Patient confirms/reports the following medications:    Patient confirms/reports the following allergies:  Allergies  Allergen Reactions  . Hydrocodone-Acetaminophen Nausea Only    No orders of the defined types were placed in this encounter.   AUTHORIZATION INFORMATION Primary Insurance: 1D#: Group #:  Secondary Insurance: 1D#: Group #:  SCHEDULE INFORMATION: Date: 10/09/2016  Time: Location: [facility]

## 2016-09-17 NOTE — Progress Notes (Signed)
  I have reviewed the above information and agree with above.   Lori Popowski, MD 

## 2016-09-18 ENCOUNTER — Encounter: Payer: Self-pay | Admitting: Internal Medicine

## 2016-09-18 ENCOUNTER — Telehealth: Payer: Self-pay

## 2016-09-18 ENCOUNTER — Telehealth: Payer: Self-pay | Admitting: Surgical

## 2016-09-18 ENCOUNTER — Ambulatory Visit (INDEPENDENT_AMBULATORY_CARE_PROVIDER_SITE_OTHER): Payer: Medicare Other | Admitting: Internal Medicine

## 2016-09-18 DIAGNOSIS — I701 Atherosclerosis of renal artery: Secondary | ICD-10-CM

## 2016-09-18 DIAGNOSIS — F321 Major depressive disorder, single episode, moderate: Secondary | ICD-10-CM | POA: Diagnosis not present

## 2016-09-18 DIAGNOSIS — Z716 Tobacco abuse counseling: Secondary | ICD-10-CM

## 2016-09-18 DIAGNOSIS — I10 Essential (primary) hypertension: Secondary | ICD-10-CM | POA: Diagnosis not present

## 2016-09-18 MED ORDER — FLUTICASONE-SALMETEROL 250-50 MCG/DOSE IN AEPB: INHALATION_SPRAY | RESPIRATORY_TRACT | 2 refills | Status: DC

## 2016-09-18 MED ORDER — BUPROPION HCL ER (SR) 100 MG PO TB12: 100.0000 mg | ORAL_TABLET | Freq: Two times a day (BID) | ORAL | 1 refills | Status: DC

## 2016-09-18 MED ORDER — ALPRAZOLAM 0.5 MG PO TABS: 0.5000 mg | ORAL_TABLET | Freq: Every evening | ORAL | 1 refills | Status: DC | PRN

## 2016-09-18 MED ORDER — AMLODIPINE BESYLATE 5 MG PO TABS: 5.0000 mg | ORAL_TABLET | Freq: Every day | ORAL | 3 refills | Status: DC

## 2016-09-18 NOTE — Patient Instructions (Addendum)
Start the wellbutrin tomorrow or the following  Day  First dose in the Am when you wake up,  2nd dose by 3 pm to avoid insomnia  Use the alprazolam at bedtime  Either  1/2 tablet at bedtime and may repeat if needed at 3 am  Or full tablet at bddtime   Continue full tablet of lexapro for one week,  Then 1/2 tablet daily for one week, then stopa  We are adding amlodipine 5 mg at bedtime for your blood pressure  RTC 1 month

## 2016-09-18 NOTE — Progress Notes (Signed)
Pre visit review using our clinic review tool, if applicable. No additional management support is needed unless otherwise documented below in the visit note. 

## 2016-09-18 NOTE — Telephone Encounter (Signed)
Sandy from Dr. Saralyn Pilar office called. They can not approve the Plavix clearance because the patient hasn't been seen since 2015.   I called Dr. Saralyn Pilar office and made the patient an appointment for tomorrow at 2:00 pm.   I called the patient and let her know of her appointment. Patient understood.   I will wait for the Plavix clearance to be sent back to me.

## 2016-09-18 NOTE — Telephone Encounter (Signed)
Melissa can you please check on the referral placed for vein and vascular on 07/18/16.

## 2016-09-18 NOTE — Progress Notes (Signed)
Subjective:  Patient ID: Ashley [name], female    DOB: Feb 10, 1951  Age: 65 y.o. MRN: [id number]  CC: Diagnoses of Tobacco abuse counseling, Moderate single current episode of major depressive disorder ([place]), and Essential hypertension, benign were pertinent to this visit.  HPI Ashley [name] presents for management of a positive screen for depression during her recent wellness exam   No interest in showering or  Leaving the house.  Has been overating. Has no energy.  Falls alseep ok but has early waking so  gets up early but doesn't  go anywhere,  Not suicidal,  No feelings of decreased self worth.  .    Depression screen positive scored 16   2) Menopause:  Still having frequent hot flashes .  taking lexapro 10 mg daily  Which  intially helped but not now .having them   Day and night .    Outpatient Medications Prior to Visit  Medication Sig Dispense Refill  . acetaminophen (TYLENOL) 500 MG tablet Maximum 6 tablets a day. (Patient taking differently: Take 500 mg by mouth every 4 (four) hours as needed for fever. Maximum 6 tablets a day.) 42 tablet 0  . albuterol (PROVENTIL HFA;VENTOLIN HFA) 108 (90 BASE) MCG/ACT inhaler Inhale 2 puffs into the lungs every 6 (six) hours as needed for wheezing.    [name] aspirin 81 MG tablet Take 81 mg by mouth daily.    [name] atorvastatin (LIPITOR) 40 MG tablet Take 1 tablet (40 mg total) by mouth daily. 90 tablet 3  . carvedilol (COREG) 6.25 MG tablet TAKE ONE TABLET BY MOUTH TWICE DAILY WITH MEAL 180 tablet 2  . clopidogrel (PLAVIX) 75 MG tablet TAKE ONE TABLET BY MOUTH EVERY DAY 90 tablet 2  . escitalopram (LEXAPRO) 10 MG tablet TAKE ONE TABLET BY MOUTH EVERY DAY 90 tablet 2  . Fluticasone-Salmeterol (ADVAIR DISKUS) 250-50 MCG/DOSE AEPB INHALE 1 PUFF TWICE DAILY RINSE MOUTH AFTER EACH USE 180 each 2  . furosemide (LASIX) 20 MG tablet TAKE ONE TABLET BY MOUTH EVERY DAY 90 tablet 2  . lisinopril (PRINIVIL,ZESTRIL) 20 MG tablet TAKE ONE TABLET BY MOUTH EVERY  DAY 90 tablet 2  . nitroGLYCERIN (NITROSTAT) 0.4 MG SL tablet Place 1 tablet (0.4 mg total) under the tongue every 5 (five) minutes as needed for chest pain. 30 tablet 3  . omeprazole (PRILOSEC) 40 MG capsule TAKE ONE CAPSULE BY MOUTH EVERY DAY 90 capsule 2  . Tiotropium Bromide Monohydrate (SPIRIVA RESPIMAT) 2.5 MCG/ACT AERS INHALE TWO SPRAYS BY MOUTH ONCE DAILY 4 g 6   No facility-administered medications prior to visit.     Review of Systems;  Patient denies headache, fevers, malaise, unintentional weight loss, skin rash, eye pain, sinus congestion and sinus pain, sore throat, dysphagia,  hemoptysis , cough, dyspnea, wheezing, chest pain, palpitations, orthopnea, edema, abdominal pain, nausea, melena, diarrhea, constipation, flank pain, dysuria, hematuria, urinary  Frequency, nocturia, numbness, tingling, seizures,  Focal weakness, Loss of consciousness,  Tremor,  and suicidal ideation.      Objective:  BP (!) 144/88   Pulse 78   Temp 97.6 F (36.4 C) (Oral)   Resp 15   Ht 5\' 3"  (1.6 m)   Wt 179 lb 3.2 oz (81.3 kg)   SpO2 96%   BMI 31.74 kg/m   BP Readings from Last 3 Encounters:  09/18/16 (!) 144/88  09/13/16 128/82  06/28/16 (!) 145/84    Wt Readings from Last 3 Encounters:  09/18/16 179 lb 3.2 oz (81.3 kg)  09/13/16 177 lb 12.8 oz (80.6 kg)  06/22/16 176 lb 5.9 oz (80 kg)    General appearance: alert, cooperative and appears much older than stated age Ears: normal TM's and external ear canals both ears Throat: lips, mucosa, and tongue normal; teeth and gums normal Neck: no adenopathy, no carotid bruit, supple, symmetrical, trachea midline and thyroid not enlarged, symmetric, no tenderness/mass/nodules Back: symmetric, no curvature. ROM normal. No CVA tenderness. Lungs: clear to auscultation bilaterally Heart: regular rate and rhythm, S1, S2 normal, no murmur, click, rub or gallop Abdomen: soft, non-tender; bowel sounds normal; no masses,  no organomegaly Pulses: 2+  and symmetric Skin: Skin color, texture, turgor normal. No rashes or lesions Lymph nodes: Cervical, supraclavicular, and axillary nodes normal. Psych: affect normal, makes good eye contact. No fidgeting,  Smiles easily.  Denies suicidal thoughts   Lab Results  Component Value Date   HGBA1C 5.7 06/21/2016    Lab Results  Component Value Date   CREATININE 0.97 06/28/2016   CREATININE 1.32 (H) 06/21/2016   CREATININE 1.64 (H) 06/20/2016    Lab Results  Component Value Date   WBC 10.5 06/20/2016   HGB 12.6 06/20/2016   HCT 36.4 06/20/2016   PLT 228 06/20/2016   GLUCOSE 98 06/28/2016   CHOL 229 (H) 06/06/2016   TRIG (H) 06/06/2016    477.0 Triglyceride is over 400; calculations on Lipids are invalid.   HDL 35.30 (L) 06/06/2016   LDLDIRECT 155.0 06/06/2016   LDLCALC 90 10/09/2014   ALT 37 (H) 06/06/2016   AST 31 06/06/2016   NA 141 06/28/2016   K 3.8 06/28/2016   CL 107 06/28/2016   CREATININE 0.97 06/28/2016   BUN 17 06/28/2016   CO2 25 06/28/2016   TSH 2.301 06/21/2016   INR 1.1 08/19/2014   HGBA1C 5.7 06/21/2016    US Carotid Duplex Bilateral  Result Date: 07/04/2016 CLINICAL DATA:  TIA symptoms, hyperlipidemia, tobacco use EXAM: BILATERAL CAROTID DUPLEX ULTRASOUND TECHNIQUE: Pearline Cables scale imaging, color Doppler and duplex ultrasound were performed of bilateral carotid and vertebral arteries in the neck. COMPARISON:  None available FINDINGS: Criteria: Quantification of carotid stenosis is based on velocity parameters that correlate the residual internal carotid diameter with NASCET-based stenosis levels, using the diameter of the distal internal carotid lumen as the denominator for stenosis measurement. The following velocity measurements were obtained: RIGHT ICA:  88/33 cm/sec CCA:  33/29 cm/sec SYSTOLIC ICA/CCA RATIO:  2.0 DIASTOLIC ICA/CCA RATIO:  2.4 ECA:  144 cm/sec LEFT ICA:  122/43 cm/sec CCA:  51/88 cm/sec SYSTOLIC ICA/CCA RATIO:  1.9 DIASTOLIC ICA/CCA RATIO:  2.6 ECA:   71 cm/sec RIGHT CAROTID ARTERY: Mild echogenic shadowing plaque formation. No hemodynamically significant right ICA stenosis, velocity elevation, or turbulent flow. Degree of narrowing less than 50%. RIGHT VERTEBRAL ARTERY:  Antegrade LEFT CAROTID ARTERY: Similar scattered mild echogenic plaque formation. No hemodynamically significant left ICA stenosis, velocity elevation, or turbulent flow. LEFT VERTEBRAL ARTERY:  Antegrade IMPRESSION: Mild carotid atherosclerosis. No hemodynamically significant ICA stenosis. Degree of narrowing less than 50% bilaterally. Patent antegrade vertebral flow bilaterally. Electronically Signed   By: Jerilynn Mages.  Shick M.D.   On: 07/04/2016 14:13    Assessment & Plan:   Problem List Items Addressed This Visit    Essential hypertension, benign    Adding 5 mg amlodipine at bedtime for morning elevations. Noted by patient        Relevant Medications   amLODipine (NORVASC) 5 MG tablet   Depression    Screening repeated.  She is not suicidal.  Trial of wellbutrin.  Wean off lexapro, alprazolam qhs prn insmonia. The risks and benefits of benzodiazepine use were discussed with patient today including excessive sedation leading to respiratory depression,  impaired thinking/driving, and addiction.  Patient was advised to avoid concurrent use with alcohol, to use medication only as needed and not to share with others  .       Relevant Medications   buPROPion (WELLBUTRIN SR) 100 MG 12 hr tablet   ALPRAZolam (XANAX) 0.5 MG tablet   Tobacco abuse counseling    Risks of continued tobacco use were discussed. She is aware of the risks and willing to try wellbutrin to help her quit smoking         I am having Ms. Haldeman start on buPROPion, ALPRAZolam, and amLODipine. I am also having her maintain her aspirin, albuterol, atorvastatin, acetaminophen, lisinopril, carvedilol, clopidogrel, furosemide, omeprazole, escitalopram, Tiotropium Bromide Monohydrate, nitroGLYCERIN, and  Fluticasone-Salmeterol.  Meds ordered this encounter  Medications  . buPROPion (WELLBUTRIN SR) 100 MG 12 hr tablet    Sig: Take 1 tablet (100 mg total) by mouth 2 (two) times daily.    Dispense:  60 tablet    Refill:  1  . ALPRAZolam (XANAX) 0.5 MG tablet    Sig: Take 1 tablet (0.5 mg total) by mouth at bedtime as needed for anxiety.    Dispense:  30 tablet    Refill:  1  . amLODipine (NORVASC) 5 MG tablet    Sig: Take 1 tablet (5 mg total) by mouth daily.    Dispense:  90 tablet    Refill:  3    There are no discontinued medications.  Follow-up: No Follow-up on file.   [name], MD

## 2016-09-19 ENCOUNTER — Telehealth: Payer: Self-pay

## 2016-09-19 ENCOUNTER — Encounter: Payer: Self-pay | Admitting: Internal Medicine

## 2016-09-19 DIAGNOSIS — I5022 Chronic systolic (congestive) heart failure: Secondary | ICD-10-CM | POA: Diagnosis not present

## 2016-09-19 DIAGNOSIS — J410 Simple chronic bronchitis: Secondary | ICD-10-CM | POA: Diagnosis not present

## 2016-09-19 DIAGNOSIS — I10 Essential (primary) hypertension: Secondary | ICD-10-CM | POA: Diagnosis not present

## 2016-09-19 DIAGNOSIS — Z95810 Presence of automatic (implantable) cardiac defibrillator: Secondary | ICD-10-CM | POA: Diagnosis not present

## 2016-09-19 DIAGNOSIS — I214 Non-ST elevation (NSTEMI) myocardial infarction: Secondary | ICD-10-CM | POA: Diagnosis not present

## 2016-09-19 DIAGNOSIS — E7800 Pure hypercholesterolemia, unspecified: Secondary | ICD-10-CM | POA: Diagnosis not present

## 2016-09-19 DIAGNOSIS — Z951 Presence of aortocoronary bypass graft: Secondary | ICD-10-CM | POA: Diagnosis not present

## 2016-09-19 NOTE — Assessment & Plan Note (Signed)
Screening repeated.  She is not suicidal.  Trial of wellbutrin.  Wean off lexapro, alprazolam qhs prn insmonia. The risks and benefits of benzodiazepine use were discussed with patient today including excessive sedation leading to respiratory depression,  impaired thinking/driving, and addiction.  Patient was advised to avoid concurrent use with alcohol, to use medication only as needed and not to share with others  .

## 2016-09-19 NOTE — Assessment & Plan Note (Signed)
Risks of continued tobacco use were discussed. She is aware of the risks and willing to try wellbutrin to help her quit smoking

## 2016-09-19 NOTE — Assessment & Plan Note (Deleted)
Adding 5 mg amlodipine at bedtime for morning elevations. Noted by patient

## 2016-09-19 NOTE — Assessment & Plan Note (Signed)
Adding 5 mg amlodipine at bedtime for morning elevations. Noted by patient

## 2016-09-19 NOTE — Telephone Encounter (Signed)
I received authorization for the patient to stop Plavix 5 days prior to her procedure and resume a day after. I called the patient and let her know. Patient is aware and understood

## 2016-09-26 ENCOUNTER — Ambulatory Visit: Payer: Medicare Other | Admitting: Internal Medicine

## 2016-09-26 ENCOUNTER — Other Ambulatory Visit: Payer: Self-pay | Admitting: Internal Medicine

## 2016-09-26 DIAGNOSIS — I701 Atherosclerosis of renal artery: Secondary | ICD-10-CM

## 2016-09-26 NOTE — Progress Notes (Deleted)
New Outpatient Visit Date: 09/26/2016  Referring Provider: [name], MD [address], [place] [address]  Chief Complaint: ***  HPI:  Ms. Ashley [name] is a 65 y.o. year-old female with history of ***, who has been referred by Dr. Derrel Nip for ***.  --------------------------------------------------------------------------------------------------  Cardiovascular History & Procedures: Cardiovascular Problems:  ***  Risk Factors:  ***  Cath/PCI:  ***  CV Surgery:  ***  EP Procedures and Devices:  ***  Non-Invasive Evaluation(s):  ***  Recent CV Pertinent Labs: Lab Results  Component Value Date   CHOL 229 (H) 06/06/2016   CHOL 218 (H) 05/30/2013   HDL 35.30 (L) 06/06/2016   HDL 35 (L) 05/30/2013   LDLCALC 90 10/09/2014   LDLCALC 107 (H) 05/30/2013   LDLDIRECT 155.0 06/06/2016   TRIG (H) 06/06/2016    477.0 Triglyceride is over 400; calculations on Lipids are invalid.   TRIG 381 (H) 05/30/2013   CHOLHDL 6 06/06/2016   INR 1.1 08/19/2014   BNP 2,379 (H) 08/18/2014   K 3.8 06/28/2016   K 4.0 10/27/2014   MG 2.0 02/25/2013   BUN 17 06/28/2016   BUN 12 10/27/2014   CREATININE 0.97 06/28/2016   CREATININE 0.98 10/27/2014   CREATININE 0.85 10/09/2014    --------------------------------------------------------------------------------------------------  Past Medical History:  Diagnosis Date  . CAD (coronary artery disease)    s/p CABG  . Colon cancer ([place])   . Depression   . GERD (gastroesophageal reflux disease)   . Hx of colonic polyps   . Hyperlipidemia   . Hypertension   . 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 arm surgery     fracture, has plates and screws  . CABG with mitral valve repair    . COLON SURGERY     colon cancer  . pace maker defib  2016  . TOTAL ABDOMINAL HYSTERECTOMY  1999   history of abnormal pap    Outpatient Encounter  Prescriptions as of 09/26/2016  Medication Sig  . acetaminophen (TYLENOL) 500 MG tablet Maximum 6 tablets a day. (Patient taking differently: Take 500 mg by mouth every 4 (four) hours as needed for fever. Maximum 6 tablets a day.)  . albuterol (PROVENTIL HFA;VENTOLIN HFA) 108 (90 BASE) MCG/ACT inhaler Inhale 2 puffs into the lungs every 6 (six) hours as needed for wheezing.  [name] ALPRAZolam (XANAX) 0.5 MG tablet Take 1 tablet (0.5 mg total) by mouth at bedtime as needed for anxiety.  [name] amLODipine (NORVASC) 5 MG tablet Take 1 tablet (5 mg total) by mouth daily.  [name] aspirin 81 MG tablet Take 81 mg by mouth daily.  [name] atorvastatin (LIPITOR) 40 MG tablet Take 1 tablet (40 mg total) by mouth daily.  [name] buPROPion (WELLBUTRIN SR) 100 MG 12 hr tablet Take 1 tablet (100 mg total) by mouth 2 (two) times daily.  . carvedilol (COREG) 6.25 MG tablet TAKE ONE TABLET BY MOUTH TWICE DAILY WITH MEAL  . clopidogrel (PLAVIX) 75 MG tablet TAKE ONE TABLET BY MOUTH EVERY DAY  . escitalopram (LEXAPRO) 10 MG tablet TAKE ONE TABLET BY MOUTH EVERY DAY  . Fluticasone-Salmeterol (ADVAIR DISKUS) 250-50 MCG/DOSE AEPB INHALE 1 PUFF TWICE DAILY **RINSE MOUTH AFTER EACH USE**  . furosemide (LASIX) 20 MG tablet TAKE ONE TABLET BY MOUTH EVERY DAY  . lisinopril (PRINIVIL,ZESTRIL) 20 MG tablet TAKE ONE TABLET BY MOUTH EVERY DAY  . nitroGLYCERIN (NITROSTAT) 0.4  MG SL tablet Place 1 tablet (0.4 mg total) under the tongue every 5 (five) minutes as needed for chest pain.  [name] omeprazole (PRILOSEC) 40 MG capsule TAKE ONE CAPSULE BY MOUTH EVERY DAY  . Tiotropium Bromide Monohydrate (SPIRIVA RESPIMAT) 2.5 MCG/ACT AERS INHALE TWO SPRAYS BY MOUTH ONCE DAILY   No facility-administered encounter medications on file as of 09/26/2016.     Allergies: Hydrocodone-acetaminophen  Social History   Social History  . Marital status: Widowed    Spouse name: N/A  . Number of children: 0  . Years of education: N/A   Occupational History  . Not on  file.   Social History Main Topics  . Smoking status: Current Every Day Smoker    Types: Cigarettes  . Smokeless tobacco: Never Used     Comment: 1/2-1 ppd  . Alcohol use 1.2 oz/week    2 Shots of liquor per week     Comment: Occasional  . Drug use: No  . Sexual activity: Yes   Other Topics Concern  . Not on file   Social History Narrative  . No narrative on file    Family History  Problem Relation Age of Onset  . Arthritis Mother   . Cancer Mother     uterus cancer  . Hyperlipidemia Mother   . Hypertension Mother   . Heart disease Mother   . Diabetes Mother   . Hyperlipidemia Father   . Hypertension Father   . Heart disease Father   . Diabetes Father   . Cancer Sister     ovary cancer  . Diabetes Maternal Grandmother   . Hypertension Maternal Grandmother   . Arthritis Maternal Grandmother   . Hypertension Maternal Grandfather   . Hypertension Paternal Grandmother   . Hypertension Paternal Grandfather   . Heart disease Paternal Grandfather     Review of Systems: A 12-system review of systems was performed and was negative except as noted in the HPI.  --------------------------------------------------------------------------------------------------  Physical Exam: There were no vitals taken for this visit.  General:  *** HEENT: No conjunctival pallor or scleral icterus.  Moist mucous membranes.  OP clear. Neck: Supple without lymphadenopathy, thyromegaly, JVD, or HJR.  No carotid bruit. Lungs: Normal work of breathing.  Clear to auscultation bilaterally without wheezes or crackles. Heart: Regular rate and rhythm without murmurs, rubs, or gallops.  Non-displaced PMI. Abd: Bowel sounds present.  Soft, NT/ND without hepatosplenomegaly Ext: No lower extremity edema.  Radial, PT, and DP pulses are 2+ bilaterally Skin: warm and dry without rash Neuro: CNIII-XII intact.  Strength and fine-touch sensation intact in upper and lower extremities bilaterally. Psych:  Normal mood and affect.  EKG:  ***  Lab Results  Component Value Date   WBC 10.5 06/20/2016   HGB 12.6 06/20/2016   HCT 36.4 06/20/2016   MCV 95.7 06/20/2016   PLT 228 06/20/2016    Lab Results  Component Value Date   NA 141 06/28/2016   K 3.8 06/28/2016   CL 107 06/28/2016   CO2 25 06/28/2016   BUN 17 06/28/2016   CREATININE 0.97 06/28/2016   GLUCOSE 98 06/28/2016   ALT 37 (H) 06/06/2016    Lab Results  Component Value Date   CHOL 229 (H) 06/06/2016   HDL 35.30 (L) 06/06/2016   LDLCALC 90 10/09/2014   LDLDIRECT 155.0 06/06/2016   TRIG (H) 06/06/2016    477.0 Triglyceride is over 400; calculations on Lipids are invalid.   CHOLHDL 6 06/06/2016     --------------------------------------------------------------------------------------------------  ASSESSMENT AND PLAN: Nelva Bush, MD 09/26/2016 8:10 AM

## 2016-10-03 ENCOUNTER — Ambulatory Visit (INDEPENDENT_AMBULATORY_CARE_PROVIDER_SITE_OTHER): Payer: Medicare Other | Admitting: Vascular Surgery

## 2016-10-03 ENCOUNTER — Encounter (INDEPENDENT_AMBULATORY_CARE_PROVIDER_SITE_OTHER): Payer: Self-pay | Admitting: Vascular Surgery

## 2016-10-03 VITALS — BP 170/113 | HR 86 | Resp 16 | Ht 64.0 in | Wt 177.0 lb

## 2016-10-03 DIAGNOSIS — I200 Unstable angina: Secondary | ICD-10-CM | POA: Diagnosis not present

## 2016-10-03 DIAGNOSIS — I70213 Atherosclerosis of native arteries of extremities with intermittent claudication, bilateral legs: Secondary | ICD-10-CM

## 2016-10-03 DIAGNOSIS — Z720 Tobacco use: Secondary | ICD-10-CM

## 2016-10-03 DIAGNOSIS — I701 Atherosclerosis of renal artery: Secondary | ICD-10-CM | POA: Diagnosis not present

## 2016-10-03 DIAGNOSIS — Z87891 Personal history of nicotine dependence: Secondary | ICD-10-CM

## 2016-10-03 DIAGNOSIS — F17210 Nicotine dependence, cigarettes, uncomplicated: Secondary | ICD-10-CM | POA: Diagnosis not present

## 2016-10-03 DIAGNOSIS — I150 Renovascular hypertension: Secondary | ICD-10-CM

## 2016-10-03 DIAGNOSIS — I25700 Atherosclerosis of coronary artery bypass graft(s), unspecified, with unstable angina pectoris: Secondary | ICD-10-CM

## 2016-10-03 DIAGNOSIS — I10 Essential (primary) hypertension: Secondary | ICD-10-CM

## 2016-10-03 DIAGNOSIS — E7800 Pure hypercholesterolemia, unspecified: Secondary | ICD-10-CM

## 2016-10-03 NOTE — Assessment & Plan Note (Signed)
The patient has a recent renal artery duplex suggesting high-grade right renal artery stenosis and subcritical left renal artery stenosis. Given her clinical findings of severe, poorly controlled hypertension on multiple agents intervention is certainly considered. I have discussed the risks and benefits of renal artery angiogram and possible revascularization. The pathophysiology and natural history of renal artery stenosis and detail. The patient desires to proceed with intervention. She has a colonoscopy coming up next week, and we would delay this procedure until after the colonoscopy so that she can be on dual antiplatelet therapy if a stent is placed.

## 2016-10-03 NOTE — Assessment & Plan Note (Signed)
Markedly better after intervention 2 years ago. Has not been checked in some time. We'll plan on assessing this after we deal with her renal artery issues.

## 2016-10-03 NOTE — Patient Instructions (Signed)
Renal Artery Stenosis Introduction Renal artery stenosis (RAS) is narrowing of the artery that carries blood to your kidneys. It can affect one or both kidneys. Your kidneys filter waste and extra fluid from your blood. You get rid of the waste and fluid when you urinate. Your kidneys also make an important chemical messenger (hormone) called renin. Renin helps regulate your blood pressure. The first sign of RAS may be high blood pressure. Over time, other symptoms can develop. What are the causes? Plaque buildup in your arteries (atherosclerosis) is the main cause of RAS. The plaques that cause this are made up of:  Fat.  Cholesterol.  Calcium.  Other substances. As these substances build up in your renal artery, this slows the blood supply to your kidneys. The lack of blood and oxygen causes the signs and symptoms of RAS. A much less common cause of RAS is a disease called fibromuscular dysplasia. This disease causes abnormal cell growth that narrows the renal artery. It is not related to atherosclerosis. It occurs mostly in women who are 37-38 years old. It may be passed down through families. What increases the risk? You may be at risk for renal artery stenosis if you:  Are a man who is at least 63 years old.  Are a woman who is at least 63 years old.  Have high blood pressure.  Have high cholesterol.  Are a smoker.  Abuse alcohol.  Have diabetes or prediabetes.  Are overweight.  Have a family history of early heart disease. What are the signs or symptoms? RAS usually develops slowly. You may not have any signs or symptoms at first. The earliest signs may be:  Developing high blood pressure.  A sudden increase in existing high blood pressure.  No longer responding to medicine that used to control your blood pressure. Later signs and symptoms are due to kidney damage. They may include:  Fatigue.  Shortness of breath.  Swollen legs and feet.  Dry  skin.  Headaches.  Muscle cramps.  Loss of appetite.  Nausea or vomiting. How is this diagnosed? Your health care provider may suspect RAS based on changes in your blood pressure and your risk factors. A physical exam will be done. Your health care provider may use a stethoscope to listen for a whooshing sound (bruit) that can occur where the renal artery is blocking blood flow. Several tests may be done to confirm a diagnosis of RAS. These may include:  Blood and urine tests to check your kidney function.  Imaging tests of your kidneys, such as:  A test that involves using sound waves to create an image of your kidneys and the blood flow to your kidneys (ultrasound).  A test in which dye is injected into one of your blood vessels so images can be taken as the dye flows through your renal arteries (angiogram). These tests can be done using X-rays, a CT scan (computed tomography angiogram, CTA), or a type of MRI (magnetic resonance angiogram, MRA). How is this treated? Making lifestyle changes to reduce your risk factors is the first treatment option for early RAS. If the blood flow to one of your kidneys is cut by more than half, you may need medicine to:  Lower your blood pressure. This is the main medical treatment for RAS. You may need more than one type of medicine for this. The two types that work best for RAS are:  ACE inhibitors.  Angiotensin receptor blockers.  Reduce fluid in the body (diuretics).  Lower your cholesterol (statins). If medicine is not enough to control RAS, you may need surgery. This may involve:  Threading a tube with an inflatable balloon into the renal artery to force it open (angioplasty).  Removing plaque from inside the artery (endarterectomy). Follow these instructions at home:  Take medicines only as directed by your health care provider.  Make any lifestyle changes recommended by your health care provider. This may include:  Working with a  dietitian to maintain a heart-healthy diet. This type of diet is low in saturated fat, salt, and added sugar.  Starting an exercise program as directed by your health care provider.  Maintaining a healthy weight.  Quitting smoking.  Not abusing alcohol.  Keep all follow-up visits as directed by your health care provider. This is important. Contact a health care provider if:  Your symptoms of RAS are not getting better.  Your symptoms are changing or getting worse. Get help right away if:  You have very bad pain in your back or abdomen.  You have blood in your urine. This information is not intended to replace advice given to you by your health care provider. Make sure you discuss any questions you have with your health care provider. Document Released: 07/19/2005 Document Revised: 03/30/2016 Document Reviewed: 02/05/2014  2017 [employer]

## 2016-10-03 NOTE — Progress Notes (Signed)
Patient ID: Ashley [name], female   DOB: 1951-04-29, 65 y.o.   MRN: [id number]  Chief Complaint  Patient presents with  . Re-evaluation    Renal artery stenosis    HPI Ashley [name] is a 65 y.o. female.  I am asked to see the patient by Dr. Derrel Nip for evaluation of renal artery stenosis.  The patient reports she had a workup following a syncopal episode including a carotid duplex showing subcritical less than 50% carotid artery stenosis bilaterally and a renal artery duplex which demonstrated a high-grade right renal artery stenosis and subcritical left renal artery stenosis. She reports her blood pressure control to be very poor. She is on 3 different antihypertensives with suboptimal blood pressure control. She reports when she was admitted to the hospital she had some degree of renal insufficiency as well. She reports no previous dialysis treatments. She denies fever or chills. She has a previous history of bilateral iliac artery stent placement for claudication symptoms. This has not been checked in about 2 years. This is markedly improved after her intervention. She continues to smoke.   Past Medical History:  Diagnosis Date  . CAD (coronary artery disease)    s/p CABG  . Colon cancer ([place])   . Depression   . GERD (gastroesophageal reflux disease)   . Hx of colonic polyps   . Hyperlipidemia   . Hypertension   . 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 arm surgery     fracture, has plates and screws  . CABG with mitral valve repair    . COLON SURGERY     colon cancer  . pace maker defib  2016  . TOTAL ABDOMINAL HYSTERECTOMY  1999   history of abnormal pap    Family History  Problem Relation Age of Onset  . Arthritis Mother   . Cancer Mother     uterus cancer  . Hyperlipidemia Mother   . Hypertension Mother   . Heart disease Mother   . Diabetes Mother   . Hyperlipidemia Father   .  Hypertension Father   . Heart disease Father   . Diabetes Father   . Cancer Sister     ovary cancer  . Diabetes Maternal Grandmother   . Hypertension Maternal Grandmother   . Arthritis Maternal Grandmother   . Hypertension Maternal Grandfather   . Hypertension Paternal Grandmother   . Hypertension Paternal Grandfather   . Heart disease Paternal Grandfather     Social History Social History  Substance Use Topics  . Smoking status: Current Every Day Smoker    Types: Cigarettes  . Smokeless tobacco: Never Used     Comment: 1/2-1 ppd  . Alcohol use 1.2 oz/week    2 Shots of liquor per week     Comment: Occasional  No IV drug use  Allergies  Allergen Reactions  . Hydrocodone-Acetaminophen Nausea Only    Current Outpatient Prescriptions  Medication Sig Dispense Refill  . acetaminophen (TYLENOL) 500 MG tablet Maximum 6 tablets a day. (Patient taking differently: Take 500 mg by mouth every 4 (four) hours as needed for fever. Maximum 6 tablets a day.) 42 tablet 0  . albuterol (PROVENTIL HFA;VENTOLIN HFA) 108 (90 BASE) MCG/ACT inhaler Inhale 2 puffs into the lungs every 6 (six) hours as needed for wheezing.    [name] ALPRAZolam (XANAX) 0.5 MG tablet Take 1 tablet (0.5 mg total)  by mouth at bedtime as needed for anxiety. 30 tablet 1  . amLODipine (NORVASC) 5 MG tablet Take 1 tablet (5 mg total) by mouth daily. 90 tablet 3  . aspirin 81 MG tablet Take 81 mg by mouth daily.    [name] atorvastatin (LIPITOR) 40 MG tablet Take 1 tablet (40 mg total) by mouth daily. 90 tablet 3  . buPROPion (WELLBUTRIN SR) 100 MG 12 hr tablet Take 1 tablet (100 mg total) by mouth 2 (two) times daily. 60 tablet 1  . carvedilol (COREG) 6.25 MG tablet TAKE ONE TABLET BY MOUTH TWICE DAILY WITH MEAL 180 tablet 2  . clopidogrel (PLAVIX) 75 MG tablet TAKE ONE TABLET BY MOUTH EVERY DAY 90 tablet 2  . escitalopram (LEXAPRO) 10 MG tablet TAKE ONE TABLET BY MOUTH EVERY DAY 90 tablet 2  . Fluticasone-Salmeterol (ADVAIR DISKUS)  250-50 MCG/DOSE AEPB INHALE 1 PUFF TWICE DAILY **RINSE MOUTH AFTER EACH USE** 180 each 2  . furosemide (LASIX) 20 MG tablet TAKE ONE TABLET BY MOUTH EVERY DAY 90 tablet 2  . lisinopril (PRINIVIL,ZESTRIL) 20 MG tablet TAKE ONE TABLET BY MOUTH EVERY DAY 90 tablet 2  . nitroGLYCERIN (NITROSTAT) 0.4 MG SL tablet Place 1 tablet (0.4 mg total) under the tongue every 5 (five) minutes as needed for chest pain. 30 tablet 3  . omeprazole (PRILOSEC) 40 MG capsule TAKE ONE CAPSULE BY MOUTH EVERY DAY 90 capsule 2  . Tiotropium Bromide Monohydrate (SPIRIVA RESPIMAT) 2.5 MCG/ACT AERS INHALE TWO SPRAYS BY MOUTH ONCE DAILY 4 g 6   No current facility-administered medications for this visit.       REVIEW OF SYSTEMS (Negative unless checked)  Constitutional: [] Weight loss  [] Fever  [] Chills Cardiac: [] Chest pain   [] Chest pressure   [] Palpitations   [] Shortness of breath when laying flat   [] Shortness of breath at rest   [] Shortness of breath with exertion. Vascular:  [x] Pain in legs with walking   [] Pain in legs at rest   [] Pain in legs when laying flat   [] Claudication   [] Pain in feet when walking  [] Pain in feet at rest  [] Pain in feet when laying flat   [] History of DVT   [] Phlebitis   [] Swelling in legs   [] Varicose veins   [] Non-healing ulcers Pulmonary:   [] Uses home oxygen   [] Productive cough   [] Hemoptysis   [] Wheeze  [] COPD   [] Asthma Neurologic:  [] Dizziness  [x] Blackouts   [] Seizures   [] History of stroke   [] History of TIA  [] Aphasia   [] Temporary blindness   [] Dysphagia   [] Weakness or numbness in arms   [] Weakness or numbness in legs Musculoskeletal:  [] Arthritis   [] Joint swelling   [] Joint pain   [] Low back pain Hematologic:  [] Easy bruising  [] Easy bleeding   [] Hypercoagulable state   [] Anemic  [] Hepatitis Gastrointestinal:  [] Blood in stool   [] Vomiting blood  [] Gastroesophageal reflux/heartburn   [] Abdominal pain Genitourinary:  [] Chronic kidney disease   [] Difficult urination  [] Frequent  urination  [] Burning with urination   [] Hematuria Skin:  [] Rashes   [] Ulcers   [] Wounds Psychological:  [] History of anxiety   []  History of major depression.    Physical Exam BP (!) 170/113 (BP Location: Right Arm)   Pulse 86   Resp 16   Ht 5\' 4"  (1.626 m)   Wt 177 lb (80.3 kg)   BMI 30.38 kg/m  Gen:  WD/WN, NAD. Appears older than stated age Head: [place]/AT, No temporalis wasting. Prominent temp pulse not noted.  Ear/Nose/Throat: Hearing grossly intact, nares w/o erythema or drainage, oropharynx w/o Erythema/Exudate Eyes: Conjunctiva clear, sclera non-icteric  Neck: trachea midline.  No JVD.  Pulmonary:  Good air movement, no use of accessory muscles, respirations not labored Cardiac: RRR, normal S1, S2 Vascular:  Vessel Right Left  Radial Palpable Palpable  Ulnar Palpable Palpable  Brachial Palpable Palpable  Carotid Palpable, without bruit Palpable, without bruit  Aorta Not palpable N/A  Femoral Palpable Palpable  Popliteal Palpable Palpable  PT Palpable Palpable  DP Palpable Palpable   Gastrointestinal: soft, non-tender/non-distended. No guarding/reflex. No masses, surgical incisions, or scars. Musculoskeletal: M/S 5/5 throughout.  Extremities without ischemic changes.  No deformity or atrophy.  Neurologic: Sensation grossly intact in extremities.  Symmetrical.  Speech is fluent. Motor exam as listed above. Psychiatric: Judgment intact, Mood & affect appropriate for pt's clinical situation. Dermatologic: No rashes or ulcers noted.  No cellulitis or open wounds. Lymph : No Cervical, Axillary, or Inguinal lymphadenopathy.   Radiology No results found.  Labs No results found for this or any previous visit (from the past 2160 hour(s)).  Assessment/Plan:  Atherosclerosis of native artery of extremity with intermittent claudication (HCC) Markedly better after intervention 2 years ago. Has not been checked in some time. We'll plan on assessing this after we deal with her  renal artery issues.  Essential hypertension, benign    Hypercholesterolemia lipid control important in reducing the progression of atherosclerotic disease. Continue statin therapy   Tobacco abuse We had a discussion for approximately 4-5 minutes regarding the absolute need for smoking cessation due to the deleterious nature of tobacco on the vascular system. We discussed the tobacco use would diminish patency of any intervention, and likely significantly worsen progressio of disease. We discussed multiple agents for quitting including replacement therapy or medications to reduce cravings such as Chantix. The patient voices their understanding of the importance of smoking cessation. She has set January 1 as her planned date of smoking cessation.  Renovascular hypertension The patient's blood pressure control is poor and given her recent finding of a renal artery stenosis, I would suspect there is a component of significant renovascular hypertension. We discussed that cure is uncommon with renal artery intervention, but marked improvement is typical and we should expect to see improvement over about 6 weeks' time.  Renal artery stenosis (HCC) The patient has a recent renal artery duplex suggesting high-grade right renal artery stenosis and subcritical left renal artery stenosis. Given her clinical findings of severe, poorly controlled hypertension on multiple agents intervention is certainly considered. I have discussed the risks and benefits of renal artery angiogram and possible revascularization. The pathophysiology and natural history of renal artery stenosis and detail. The patient desires to proceed with intervention. She has a colonoscopy coming up next week, and we would delay this procedure until after the colonoscopy so that she can be on dual antiplatelet therapy if a stent is placed.      Leotis Pain 10/03/2016, 3:43 PM   This note was created with Dragon medical transcription  system.  Any errors from dictation are unintentional.

## 2016-10-03 NOTE — Assessment & Plan Note (Signed)
We had a discussion for approximately 4-5 minutes regarding the absolute need for smoking cessation due to the deleterious nature of tobacco on the vascular system. We discussed the tobacco use would diminish patency of any intervention, and likely significantly worsen progressio of disease. We discussed multiple agents for quitting including replacement therapy or medications to reduce cravings such as Chantix. The patient voices their understanding of the importance of smoking cessation. She has set January 1 as her planned date of smoking cessation.

## 2016-10-03 NOTE — Assessment & Plan Note (Signed)
lipid control important in reducing the progression of atherosclerotic disease. Continue statin therapy  

## 2016-10-03 NOTE — Assessment & Plan Note (Signed)
The patient's blood pressure control is poor and given her recent finding of a renal artery stenosis, I would suspect there is a component of significant renovascular hypertension. We discussed that cure is uncommon with renal artery intervention, but marked improvement is typical and we should expect to see improvement over about 6 weeks' time.

## 2016-10-04 ENCOUNTER — Other Ambulatory Visit (INDEPENDENT_AMBULATORY_CARE_PROVIDER_SITE_OTHER): Payer: Self-pay | Admitting: Vascular Surgery

## 2016-10-04 ENCOUNTER — Encounter (INDEPENDENT_AMBULATORY_CARE_PROVIDER_SITE_OTHER): Payer: Self-pay

## 2016-10-06 ENCOUNTER — Encounter: Payer: Self-pay | Admitting: *Deleted

## 2016-10-08 MED ORDER — DEXTROSE 5 % IV SOLN
1.5000 g | INTRAVENOUS | Status: DC
  Filled 2016-10-08: qty 1.5

## 2016-10-09 ENCOUNTER — Encounter
Admission: RE | Disposition: A | Discharge status: Home / Self Care | Payer: Self-pay | Source: Ambulatory Visit | Attending: Gastroenterology

## 2016-10-09 ENCOUNTER — Ambulatory Visit: Payer: Medicare Other | Admitting: Anesthesiology

## 2016-10-09 ENCOUNTER — Ambulatory Visit
Admission: RE | Admit: 2016-10-09 | Discharge: 2016-10-09 | Disposition: A | Discharge status: Home / Self Care | Payer: Medicare Other | Source: Ambulatory Visit | Attending: Gastroenterology | Admitting: Gastroenterology

## 2016-10-09 DIAGNOSIS — K5730 Diverticulosis of large intestine without perforation or abscess without bleeding: Secondary | ICD-10-CM

## 2016-10-09 DIAGNOSIS — D122 Benign neoplasm of ascending colon: Secondary | ICD-10-CM | POA: Diagnosis not present

## 2016-10-09 DIAGNOSIS — F329 Major depressive disorder, single episode, unspecified: Secondary | ICD-10-CM | POA: Insufficient documentation

## 2016-10-09 DIAGNOSIS — K640 First degree hemorrhoids: Secondary | ICD-10-CM | POA: Diagnosis not present

## 2016-10-09 DIAGNOSIS — N189 Chronic kidney disease, unspecified: Secondary | ICD-10-CM | POA: Diagnosis not present

## 2016-10-09 DIAGNOSIS — Z7982 Long term (current) use of aspirin: Secondary | ICD-10-CM | POA: Diagnosis not present

## 2016-10-09 DIAGNOSIS — E785 Hyperlipidemia, unspecified: Secondary | ICD-10-CM | POA: Diagnosis not present

## 2016-10-09 DIAGNOSIS — F17210 Nicotine dependence, cigarettes, uncomplicated: Secondary | ICD-10-CM | POA: Insufficient documentation

## 2016-10-09 DIAGNOSIS — Z85038 Personal history of other malignant neoplasm of large intestine: Secondary | ICD-10-CM

## 2016-10-09 DIAGNOSIS — Z79899 Other long term (current) drug therapy: Secondary | ICD-10-CM | POA: Insufficient documentation

## 2016-10-09 DIAGNOSIS — Z1211 Encounter for screening for malignant neoplasm of colon: Secondary | ICD-10-CM | POA: Insufficient documentation

## 2016-10-09 DIAGNOSIS — I252 Old myocardial infarction: Secondary | ICD-10-CM | POA: Insufficient documentation

## 2016-10-09 DIAGNOSIS — D125 Benign neoplasm of sigmoid colon: Secondary | ICD-10-CM | POA: Diagnosis not present

## 2016-10-09 DIAGNOSIS — K635 Polyp of colon: Secondary | ICD-10-CM

## 2016-10-09 DIAGNOSIS — J449 Chronic obstructive pulmonary disease, unspecified: Secondary | ICD-10-CM | POA: Diagnosis not present

## 2016-10-09 DIAGNOSIS — D123 Benign neoplasm of transverse colon: Secondary | ICD-10-CM | POA: Diagnosis not present

## 2016-10-09 DIAGNOSIS — I129 Hypertensive chronic kidney disease with stage 1 through stage 4 chronic kidney disease, or unspecified chronic kidney disease: Secondary | ICD-10-CM | POA: Diagnosis not present

## 2016-10-09 DIAGNOSIS — Z951 Presence of aortocoronary bypass graft: Secondary | ICD-10-CM | POA: Diagnosis not present

## 2016-10-09 DIAGNOSIS — I2510 Atherosclerotic heart disease of native coronary artery without angina pectoris: Secondary | ICD-10-CM | POA: Diagnosis not present

## 2016-10-09 DIAGNOSIS — K219 Gastro-esophageal reflux disease without esophagitis: Secondary | ICD-10-CM | POA: Insufficient documentation

## 2016-10-09 DIAGNOSIS — K5790 Diverticulosis of intestine, part unspecified, without perforation or abscess without bleeding: Secondary | ICD-10-CM | POA: Diagnosis not present

## 2016-10-09 DIAGNOSIS — D124 Benign neoplasm of descending colon: Secondary | ICD-10-CM | POA: Diagnosis not present

## 2016-10-09 HISTORY — DX: Acute myocardial infarction, unspecified: I21.9

## 2016-10-09 HISTORY — DX: Chronic kidney disease, unspecified: N18.9

## 2016-10-09 HISTORY — PX: COLONOSCOPY WITH PROPOFOL: SHX5780

## 2016-10-09 HISTORY — DX: Chronic obstructive pulmonary disease, unspecified: J44.9

## 2016-10-09 SURGERY — COLONOSCOPY WITH PROPOFOL
Anesthesia: General

## 2016-10-09 MED ORDER — GLUCAGON HCL RDNA (DIAGNOSTIC) 1 MG IJ SOLR
INTRAMUSCULAR | Status: AC
  Filled 2016-10-09: qty 1

## 2016-10-09 MED ORDER — EPHEDRINE SULFATE 50 MG/ML IJ SOLN
INTRAMUSCULAR | Status: DC | PRN
  Administered 2016-10-09: 10 mg via INTRAVENOUS

## 2016-10-09 MED ORDER — PROPOFOL 10 MG/ML IV BOLUS
INTRAVENOUS | Status: DC | PRN
  Administered 2016-10-09: 50 mg via INTRAVENOUS

## 2016-10-09 MED ORDER — SODIUM CHLORIDE 0.9 % IV SOLN
INTRAVENOUS | Status: DC
  Administered 2016-10-09: 09:00:00 via INTRAVENOUS

## 2016-10-09 MED ORDER — MIDAZOLAM HCL 2 MG/2ML IJ SOLN
INTRAMUSCULAR | Status: DC | PRN
  Administered 2016-10-09: 1 mg via INTRAVENOUS

## 2016-10-09 MED ORDER — PROPOFOL 500 MG/50ML IV EMUL
INTRAVENOUS | Status: DC | PRN
  Administered 2016-10-09: 180 ug/kg/min via INTRAVENOUS

## 2016-10-09 MED ORDER — LIDOCAINE HCL (CARDIAC) 20 MG/ML IV SOLN
INTRAVENOUS | Status: DC | PRN
  Administered 2016-10-09: 40 mg via INTRAVENOUS

## 2016-10-09 MED ORDER — METHYLENE BLUE 0.5 % INJ SOLN
INTRAVENOUS | Status: DC | PRN
  Administered 2016-10-09: 5 mL

## 2016-10-09 MED ORDER — GLUCAGON HCL RDNA (DIAGNOSTIC) 1 MG IJ SOLR
INTRAMUSCULAR | Status: DC | PRN
  Administered 2016-10-09: 1 mg via INTRAVENOUS

## 2016-10-09 MED ORDER — FENTANYL CITRATE (PF) 100 MCG/2ML IJ SOLN
INTRAMUSCULAR | Status: DC | PRN
  Administered 2016-10-09: 50 ug via INTRAVENOUS

## 2016-10-09 NOTE — Transfer of Care (Signed)
Immediate Anesthesia Transfer of Care Note  [name]  Procedure(s) Performed: Procedure(s): COLONOSCOPY WITH PROPOFOL (N/A)  Patient Location: PACU and Endoscopy Unit  Anesthesia Type:General  Level of Consciousness: sedated  Airway & Oxygen Therapy: Patient Spontanous Breathing and Patient connected to nasal cannula oxygen  Post-op Assessment: Report given to RN and Post -op Vital signs reviewed and stable  Post vital signs: Reviewed and stable  Last Vitals:  Vitals:   10/09/16 0930 10/09/16 0939  BP: (!) 148/78 (!) 148/78  Pulse: 69 71  Resp: (!) 22 19  Temp: 36.4 C (!) 28.7 C    Complications: No apparent anesthesia complications

## 2016-10-09 NOTE — H&P (Signed)
Jonathon Bellows MD [address], [place], [place] [address] Phone: [phone] Fax : [phone]  Primary Care Physician:  [name], MD Primary Gastroenterologist:  Dr. Jonathon Bellows   Pre-Procedure History & Physical: HPI:  Ashley [name] is a 65 y.o. female is here for an colonoscopy.   Past Medical History:  Diagnosis Date  . CAD (coronary artery disease)    s/p CABG  . Chronic kidney disease    renal artery stenosis  . Colon cancer ([place])   . COPD (chronic obstructive pulmonary disease) ([place])   . Depression   . GERD (gastroesophageal reflux disease)   . Hx of colonic polyps   . Hyperlipidemia   . Hypertension   . Mitral valve disorder    s/p mitral valve repair wth CABG  . Myocardial infarction     Past Surgical History:  Procedure Laterality Date  . arm surgery     fracture, has plates and screws  . CABG with mitral valve repair    . CARDIAC VALVE REPLACEMENT    . COLON SURGERY     colon cancer  . CORONARY ARTERY BYPASS GRAFT    . pace maker defib  2016  . TOTAL ABDOMINAL HYSTERECTOMY  1999   history of abnormal pap    Prior to Admission medications   Medication Sig Start Date End Date Taking? Authorizing Provider  acetaminophen (TYLENOL) 500 MG tablet Maximum 6 tablets a day. Patient taking differently: Take 500 mg by mouth every 4 (four) hours as needed for fever. Maximum 6 tablets a day. 02/09/15   Rubbie Battiest, NP  albuterol (PROVENTIL HFA;VENTOLIN HFA) 108 (90 BASE) MCG/ACT inhaler Inhale 2 puffs into the lungs every 6 (six) hours as needed for wheezing.    Historical Provider, MD  ALPRAZolam Duanne Moron) 0.5 MG tablet Take 1 tablet (0.5 mg total) by mouth at bedtime as needed for anxiety. 09/18/16   [name], MD  amLODipine (NORVASC) 5 MG tablet Take 1 tablet (5 mg total) by mouth daily. 09/18/16   [name], MD  aspirin 81 MG tablet Take 81 mg by mouth daily.    Historical Provider, MD  atorvastatin (LIPITOR) 40 MG tablet Take 1 tablet  (40 mg total) by mouth daily. 10/11/14   [name], MD  buPROPion (WELLBUTRIN SR) 100 MG 12 hr tablet Take 1 tablet (100 mg total) by mouth 2 (two) times daily. 09/18/16   [name], MD  carvedilol (COREG) 6.25 MG tablet TAKE ONE TABLET BY MOUTH TWICE DAILY WITH MEAL 09/29/15   [name], MD  clopidogrel (PLAVIX) 75 MG tablet TAKE ONE TABLET BY MOUTH EVERY DAY 09/29/15   [name], MD  escitalopram (LEXAPRO) 10 MG tablet TAKE ONE TABLET BY MOUTH EVERY DAY 09/29/15   [name], MD  Fluticasone-Salmeterol (ADVAIR DISKUS) 250-50 MCG/DOSE AEPB INHALE 1 PUFF TWICE DAILY **RINSE MOUTH AFTER EACH USE** 09/18/16   [name], MD  furosemide (LASIX) 20 MG tablet TAKE ONE TABLET BY MOUTH EVERY DAY 09/29/15   [name], MD  lisinopril (PRINIVIL,ZESTRIL) 20 MG tablet TAKE ONE TABLET BY MOUTH EVERY DAY 09/29/15   [name], MD  nitroGLYCERIN (NITROSTAT) 0.4 MG SL tablet Place 1 tablet (0.4 mg total) under the tongue every 5 (five) minutes as needed for chest pain. 06/19/16   [name], MD  omeprazole (PRILOSEC) 40 MG capsule TAKE ONE CAPSULE BY MOUTH EVERY DAY 09/29/15   [name],  MD  Tiotropium Bromide Monohydrate (SPIRIVA RESPIMAT) 2.5 MCG/ACT AERS INHALE TWO SPRAYS BY MOUTH ONCE DAILY 06/06/16   [name], MD    Allergies as of 09/15/2016 - Review Complete 09/15/2016  Allergen Reaction Noted  . Hydrocodone-acetaminophen Nausea Only 08/25/2014    Family History  Problem Relation Age of Onset  . Arthritis Mother   . Cancer Mother     uterus cancer  . Hyperlipidemia Mother   . Hypertension Mother   . Heart disease Mother   . Diabetes Mother   . Hyperlipidemia Father   . Hypertension Father   . Heart disease Father   . Diabetes Father   . Cancer Sister     ovary cancer  . Diabetes Maternal Grandmother   . Hypertension Maternal Grandmother   . Arthritis Maternal Grandmother   . Hypertension Maternal Grandfather   . Hypertension Paternal  Grandmother   . Hypertension Paternal Grandfather   . Heart disease Paternal Grandfather     Social History   Social History  . Marital status: Widowed    Spouse name: N/A  . Number of children: 0  . Years of education: N/A   Occupational History  . Not on file.   Social History Main Topics  . Smoking status: Current Every Day Smoker    Types: Cigarettes  . Smokeless tobacco: Never Used     Comment: 1/2-1 ppd  . Alcohol use 1.2 oz/week    2 Shots of liquor per week     Comment: Occasional last drink thansgiving. vodka and oj  . Drug use: No  . Sexual activity: Yes   Other Topics Concern  . Not on file   Social History Narrative  . No narrative on file    Review of Systems: See HPI, otherwise negative ROS  Physical Exam: There were no vitals taken for this visit. General:   Alert,  pleasant and cooperative in NAD Head:  Normocephalic and atraumatic. Neck:  Supple; no masses or thyromegaly. Lungs:  Clear throughout to auscultation.    Heart:  Regular rate and rhythm. Abdomen:  Soft, nontender and nondistended. Normal bowel sounds, without guarding, and without rebound.   Neurologic:  Alert and  oriented x4;  grossly normal neurologically.  Impression/Plan: Ashley [name] is here for an colonoscopy to be performed for colorectal cancer screening . She has had colon cancer with resection of 5 inches colon >6 years back with no subsequent follow up , she has been on plavix and has been off for the past 5 days .   Risks, benefits, limitations, and alternatives regarding  colonoscopy have been reviewed with the patient.  Questions have been answered.  All parties agreeable.   Jonathon Bellows, MD  10/09/2016, 8:38 AM

## 2016-10-09 NOTE — Brief Op Note (Signed)
Magnet used for ICD during cautery.

## 2016-10-09 NOTE — Anesthesia Procedure Notes (Signed)
Date/Time: 10/09/2016 8:56 AM Performed by: Doreen Salvage Pre-anesthesia Checklist: Patient identified, Emergency Drugs available, Suction available and Patient being monitored Patient Re-evaluated:Patient Re-evaluated prior to inductionOxygen Delivery Method: Nasal cannula Intubation Type: IV induction Dental Injury: Teeth and Oropharynx as per pre-operative assessment  Comments: Nasal cannula with etCO2 monitoring

## 2016-10-09 NOTE — Anesthesia Postprocedure Evaluation (Signed)
Anesthesia Post Note  Patient: MAHAM QUINTIN  Procedure(s) Performed: Procedure(s) (LRB): COLONOSCOPY WITH PROPOFOL (N/A)  Patient location during evaluation: PACU Anesthesia Type: General Level of consciousness: awake and alert Pain management: pain level controlled Vital Signs Assessment: post-procedure vital signs reviewed and stable Respiratory status: spontaneous breathing, nonlabored ventilation, respiratory function stable and patient connected to nasal cannula oxygen Cardiovascular status: blood pressure returned to baseline and stable Postop Assessment: no signs of nausea or vomiting Anesthetic complications: no    Last Vitals:  Vitals:   10/09/16 0940 10/09/16 0950  BP: (!) 148/78 (!) 156/90  Pulse: 70 71  Resp: (!) 37 18  Temp:      Last Pain:  Vitals:   10/09/16 0939  TempSrc: Tympanic                 Molli Barrows

## 2016-10-09 NOTE — Anesthesia Preprocedure Evaluation (Signed)
Anesthesia Evaluation  Patient identified by MRN, date of birth, ID band Patient awake    Reviewed: Allergy & Precautions, H&P , NPO status , Patient's Chart, lab work & pertinent test results, reviewed documented beta blocker date and time   Airway Mallampati: II   Neck ROM: full    Dental  (+) Teeth Intact   Pulmonary neg pulmonary ROS, COPD, Current Smoker,    Pulmonary exam normal        Cardiovascular hypertension, + CAD and + Peripheral Vascular Disease  negative cardio ROS Normal cardiovascular exam Rhythm:regular Rate:Normal     Neuro/Psych PSYCHIATRIC DISORDERS negative neurological ROS  negative psych ROS   GI/Hepatic negative GI ROS, Neg liver ROS, GERD  ,  Endo/Other  negative endocrine ROS  Renal/GU Renal diseasenegative Renal ROS  negative genitourinary   Musculoskeletal   Abdominal   Peds  Hematology negative hematology ROS (+)   Anesthesia Other Findings Past Medical History: No date: CAD (coronary artery disease)     Comment: s/p CABG No date: Colon cancer ([place]) No date: Depression No date: GERD (gastroesophageal reflux disease) No date: Hx of colonic polyps No date: Hyperlipidemia No date: Hypertension No date: Mitral valve disorder     Comment: s/p mitral valve repair wth CABG Past Surgical History: No date: arm surgery     Comment: fracture, has plates and screws No date: CABG with mitral valve repair No date: CARDIAC VALVE REPLACEMENT No date: COLON SURGERY     Comment: colon cancer No date: CORONARY ARTERY BYPASS GRAFT 2016: pace maker defib 1999: TOTAL ABDOMINAL HYSTERECTOMY     Comment: history of abnormal pap   Reproductive/Obstetrics negative OB ROS                             Anesthesia Physical Anesthesia Plan  ASA: III  Anesthesia Plan: General   Post-op Pain Management:    Induction:   Airway Management Planned:   Additional  Equipment:   Intra-op Plan:   Post-operative Plan:   Informed Consent: I have reviewed the patients History and Physical, chart, labs and discussed the procedure including the risks, benefits and alternatives for the proposed anesthesia with the patient or authorized representative who has indicated his/her understanding and acceptance.   Dental Advisory Given  Plan Discussed with: CRNA  Anesthesia Plan Comments:         Anesthesia Quick Evaluation

## 2016-10-09 NOTE — Brief Op Note (Signed)
Small descending colon polyp not retrieved

## 2016-10-09 NOTE — Op Note (Signed)
[facility] Gastroenterology Patient Name: Ashley [name] Procedure Date: 10/09/2016 8:57 AM MRN: [id number] Account #: [id number] Date of Birth: 02-19-1951 Admit Type: Ambulatory Age: 65 Room: [facility] ENDO ROOM 3 Gender: Female Note Status: Finalized Procedure:            Colonoscopy Indications:          High risk colon cancer surveillance: Personal history                        of colon cancer Providers:            Jonathon Bellows MD, MD Referring MD:         Deborra Medina, MD (Referring MD) Medicines:            Monitored Anesthesia Care Complications:        No immediate complications. Procedure:            Pre-Anesthesia Assessment:                       - Prior to the procedure, a History and Physical was                        performed, and patient medications, allergies and                        sensitivities were reviewed. The patient's tolerance of                        previous anesthesia was reviewed.                       - The risks and benefits of the procedure and the                        sedation options and risks were discussed with the                        patient. All questions were answered and informed                        consent was obtained.                       - The risks and benefits of the procedure and the                        sedation options and risks were discussed with the                        patient. All questions were answered and informed                        consent was obtained.                       - ASA Grade Assessment: III - A patient with severe                        systemic disease.  After obtaining informed consent, the colonoscope was                        passed under direct vision. Throughout the procedure,                        the patient's blood pressure, pulse, and oxygen                        saturations were monitored continuously. The                        Colonoscope  was introduced through the anus and                        advanced to the the cecum, identified by the                        appendiceal orifice, IC valve and transillumination.                        The colonoscopy was performed with ease. The patient                        tolerated the procedure well. The quality of the bowel                        preparation was excellent. Findings:      Scattered medium-mouthed diverticula were found in the sigmoid colon.       There was no evidence of diverticular bleeding.      Non-bleeding internal hemorrhoids were found during retroflexion. The       hemorrhoids were mild, medium-sized and Grade I (internal hemorrhoids       that do not prolapse).      A 5 mm, non-bleeding polyp was found in the ascending colon. The polyp       was sessile. The polyp was removed with a cold biopsy forceps. Resection       and retrieval were complete.      Two sessile polyps were found in the transverse colon. The polyps were 7       to 9 mm in size. These polyps were removed with a hot snare. Resection       and retrieval were complete.      A 6 mm, non-bleeding polyp was found in the descending colon. The polyp       was sessile. The polyp was removed with a cold snare. Resection and       retrieval were complete. No biopsies or other specimens were collected       for this exam.      A 15 mm polyp was found in the sigmoid colon. The polyp was sessile. The       polyp was removed with a saline injection-lift technique using a hot       snare. Resection and retrieval were complete. To prevent bleeding after       the polypectomy, two hemostatic clips were successfully placed. There       was no bleeding at the end of the procedure.      A 5 mm polyp was found in the sigmoid colon. The polyp was sessile. The  polyp was removed with a cold biopsy forceps. Resection and retrieval       were complete. Impression:           - Moderate diverticulosis in the  sigmoid colon. There                        was no evidence of diverticular bleeding.                       - Non-bleeding internal hemorrhoids.                       - One 5 mm, non-bleeding polyp in the ascending colon,                        removed with a cold biopsy forceps. Resected and                        retrieved.                       - Two 7 to 9 mm polyps in the transverse colon, removed                        with a hot snare. Resected and retrieved.                       - One 6 mm, non-bleeding polyp in the descending colon,                        removed with a cold snare. Resected and retrieved. No                        specimens collected.                       - One 15 mm polyp in the sigmoid colon, removed using                        injection-lift and a hot snare. Resected and retrieved.                        Clips were placed.                       - One 5 mm polyp in the sigmoid colon, removed with a                        cold biopsy forceps. Resected and retrieved. Recommendation:       - Patient has a contact number available for                        emergencies. The signs and symptoms of potential                        delayed complications were discussed with the patient.                        Return to normal activities tomorrow. Written discharge  instructions were provided to the patient.                       - Resume previous diet.                       - Continue present medications.                       - Resume aspirin today and Plavix (clopidogrel) today                        at prior doses.                       - Discharge patient to home (with escort).                       - Repeat colonoscopy in 3 years for surveillance based                        on pathology results. Procedure Code(s):    --- Professional ---                       [phone], Colonoscopy, flexible; with removal of tumor(s),                         polyp(s), or other lesion(s) by snare technique                       45380, 37, Colonoscopy, flexible; with biopsy, single                        or multiple                       45381, Colonoscopy, flexible; with directed submucosal                        injection(s), any substance Diagnosis Code(s):    --- Professional ---                       J88.416, Personal history of other malignant neoplasm                        of large intestine                       K64.0, First degree hemorrhoids                       D12.2, Benign neoplasm of ascending colon                       D12.4, Benign neoplasm of descending colon                       D12.5, Benign neoplasm of sigmoid colon                       D12.3, Benign neoplasm of transverse colon (hepatic  flexure or splenic flexure)                       K57.30, Diverticulosis of large intestine without                        perforation or abscess without bleeding CPT copyright 2016 American Medical Association. All rights reserved. The codes documented in this report are preliminary and upon coder review may  be revised to meet current compliance requirements. Jonathon Bellows, MD Jonathon Bellows MD, MD 10/09/2016 9:38:16 AM This report has been signed electronically. Number of Addenda: 0 Note Initiated On: 10/09/2016 8:57 AM Scope Withdrawal Time: 0 hours 24 minutes 48 seconds  Total Procedure Duration: 0 hours 31 minutes 8 seconds       [facility]

## 2016-10-10 ENCOUNTER — Encounter
Admission: RE | Admit: 2016-10-10 | Discharge: 2016-10-10 | Disposition: A | Discharge status: Home / Self Care | Payer: Medicare Other | Source: Ambulatory Visit | Attending: Vascular Surgery | Admitting: Vascular Surgery

## 2016-10-10 ENCOUNTER — Encounter: Payer: Self-pay | Admitting: Gastroenterology

## 2016-10-10 DIAGNOSIS — I701 Atherosclerosis of renal artery: Secondary | ICD-10-CM | POA: Insufficient documentation

## 2016-10-10 DIAGNOSIS — Z01812 Encounter for preprocedural laboratory examination: Secondary | ICD-10-CM | POA: Insufficient documentation

## 2016-10-10 HISTORY — DX: Peripheral vascular disease, unspecified: I73.9

## 2016-10-10 HISTORY — DX: Presence of automatic (implantable) cardiac defibrillator: Z95.810

## 2016-10-10 HISTORY — DX: Heart failure, unspecified: I50.9

## 2016-10-10 HISTORY — DX: Dyspnea, unspecified: R06.00

## 2016-10-10 LAB — SURGICAL PATHOLOGY

## 2016-10-10 LAB — CREATININE, SERUM
Creatinine, Ser: 1 mg/dL (ref 0.44–1.00)
GFR calc Af Amer: >60 mL/min (ref >60)
GFR, EST NON AFRICAN AMERICAN: 59 mL/min — AB (ref >60)

## 2016-10-10 LAB — BUN: BUN: 11 mg/dL (ref 6–20)

## 2016-10-10 NOTE — Patient Instructions (Signed)
Your procedure is scheduled on: October 12, 2016 AT 06:45AM Su procedimiento est programado para: Report to SPECIAL PROCEDURES. Presntese a:   Remember: Instructions that are not followed completely may result in serious medical risk, up to and including death, or upon the discretion of your surgeon and anesthesiologist your surgery may need to be rescheduled.  Recuerde: Las instrucciones que no se siguen completamente Heritage manager en un riesgo de salud grave, incluyendo hasta la [place] o a discrecin de su cirujano y Environmental health practitioner, su ciruga se puede posponer.   __X__ 1. Do not eat food or drink liquids after midnight. No gum chewing or hard candies.  No coma alimentos ni tome lquidos despus de la medianoche.  No mastique chicle ni caramelos  duros.     __X__ 2. No alcohol for 24 hours before or after surgery.    No tome alcohol durante las 24 horas antes ni despus de la [place].   __X__ 3. Bring all medications with you on the day of surgery if instructed.     Lleve todos los medicamentos con usted el da de su ciruga si se le ha indicado as.   __X__ 4. Notify your doctor if there is any change in your medical condition (cold, fever,                             infections).    Informe a su mdico si hay algn cambio en su condicin mdica (resfriado, fiebre, infecciones).   Do not wear jewelry, make-up, hairpins, clips or nail polish.  No use joyas, maquillajes, pinzas/ganchos para el cabello ni esmalte de uas.  Do not wear lotions, powders, or perfumes. You may wear deodorant.  No use lociones, polvos o perfumes.  Puede usar desodorante.    Do not shave 48 hours prior to surgery. Men may shave face and neck.  No se afeite 48 horas antes de la [place].  Los hombres pueden [employer] cara y el cuello.   Do not bring valuables to the hospital.   No lleve objetos [place] is not responsible for any belongings or valuables.  Isabel no se hace  responsable de ningn tipo de pertenencias u objetos de Geographical information systems officer.               Contacts, dentures or bridgework may not be worn into surgery.  Los lentes de [place], las dentaduras postizas o puentes no se pueden usar en la [place].  Leave your suitcase in the car. After surgery it may be brought to your room.  Deje su maleta en el auto.  Despus de la ciruga podr traerla a su habitacin.  For patients admitted to the hospital, discharge time is determined by your treatment team.  Para los pacientes que sean ingresados al hospital, el tiempo en el cual se le dar de alta es determinado por su                equipo de [place].   Patients discharged the day of surgery will not be allowed to drive home. A los pacientes que se les da de alta el mismo da de la ciruga no se les permitir conducir a Holiday representative.   Please read over the following fact sheets that you were given: Por favor [place] informacin que le dieron:     _X___ Take these medicines the morning of surgery with A SIP OF  WATER:          [employer] la maana de la ciruga con UN SORBO DE AGUA:  1. TAKE MEDICATIONS AS DIRECTED BY MD  2.   3.   4.       5.  6.  ____ Fleet Enema (as directed)          Enema de Fleet (segn lo indicado)    ____ Use CHG Soap as directed          Utilice el jabn de CHG segn lo indicado  __X__ Use inhalers on the day of surgery          Use los inhaladores el da de la ciruga  ____ Stop metformin 2 days prior to surgery          Deje de tomar el metformin 2 das antes de la ciruga    ____ Take 1/2 of usual insulin dose the night before surgery and none on the morning of surgery           Tome la mitad de la dosis habitual de insulina la noche antes de la [place] y no tome nada en la maana de la             ciruga  ____ Stop Coumadin/Plavix/aspirin on           Deje de tomar el Coumadin/Plavix/aspirina el da:  __X__ Stop Anti-inflammatories UNTIL  AFTER PROCEDURE ALEVE, IBUPROFEN, MOTRIN AND ADVIL          Deje de tomar antiinflamatorios el da:   ____ Stop supplements until after surgery            Deje de tomar suplementos hasta despus de la ciruga  ____ Bring C-Pap to the hospital          [place] [facility] hospital

## 2016-10-12 ENCOUNTER — Encounter: Admission: RE | Payer: Self-pay | Source: Ambulatory Visit

## 2016-10-12 ENCOUNTER — Encounter (INDEPENDENT_AMBULATORY_CARE_PROVIDER_SITE_OTHER): Payer: Self-pay

## 2016-10-12 ENCOUNTER — Ambulatory Visit: Admission: RE | Admit: 2016-10-12 | Payer: Medicare Other | Source: Ambulatory Visit | Admitting: Vascular Surgery

## 2016-10-12 SURGERY — RENAL ANGIOGRAPHY
Anesthesia: Moderate Sedation

## 2016-10-12 MED ORDER — SODIUM CHLORIDE 0.9 % IV SOLN: INTRAVENOUS | Status: DC

## 2016-10-12 MED ORDER — HYDROMORPHONE HCL 1 MG/ML IJ SOLN: 1.0000 mg | Freq: Once | INTRAMUSCULAR | Status: DC

## 2016-10-12 MED ORDER — FAMOTIDINE 20 MG PO TABS: 40.0000 mg | ORAL_TABLET | ORAL | Status: DC | PRN

## 2016-10-12 MED ORDER — ONDANSETRON HCL 4 MG/2ML IJ SOLN: 4.0000 mg | Freq: Four times a day (QID) | INTRAMUSCULAR | Status: DC | PRN

## 2016-10-12 MED ORDER — METHYLPREDNISOLONE SODIUM SUCC 125 MG IJ SOLR: 125.0000 mg | INTRAMUSCULAR | Status: DC | PRN

## 2016-10-19 ENCOUNTER — Encounter: Payer: Self-pay | Admitting: Internal Medicine

## 2016-10-20 ENCOUNTER — Inpatient Hospital Stay: Admission: RE | Admit: 2016-10-20 | Payer: Medicare Other | Source: Ambulatory Visit

## 2016-10-23 ENCOUNTER — Ambulatory Visit: Payer: Medicare Other | Admitting: Internal Medicine

## 2016-10-23 ENCOUNTER — Ambulatory Visit
Admission: RE | Admit: 2016-10-23 | Discharge: 2016-10-23 | Disposition: A | Discharge status: Home / Self Care | Payer: Medicare Other | Source: Ambulatory Visit | Attending: Vascular Surgery | Admitting: Vascular Surgery

## 2016-10-23 ENCOUNTER — Encounter
Admission: RE | Disposition: A | Discharge status: Home / Self Care | Payer: Self-pay | Source: Ambulatory Visit | Attending: Vascular Surgery

## 2016-10-23 DIAGNOSIS — F17210 Nicotine dependence, cigarettes, uncomplicated: Secondary | ICD-10-CM | POA: Diagnosis not present

## 2016-10-23 DIAGNOSIS — Z833 Family history of diabetes mellitus: Secondary | ICD-10-CM | POA: Insufficient documentation

## 2016-10-23 DIAGNOSIS — Z952 Presence of prosthetic heart valve: Secondary | ICD-10-CM | POA: Insufficient documentation

## 2016-10-23 DIAGNOSIS — Z8249 Family history of ischemic heart disease and other diseases of the circulatory system: Secondary | ICD-10-CM | POA: Diagnosis not present

## 2016-10-23 DIAGNOSIS — I701 Atherosclerosis of renal artery: Secondary | ICD-10-CM | POA: Diagnosis not present

## 2016-10-23 DIAGNOSIS — Z885 Allergy status to narcotic agent status: Secondary | ICD-10-CM | POA: Insufficient documentation

## 2016-10-23 DIAGNOSIS — Z86010 Personal history of colonic polyps: Secondary | ICD-10-CM | POA: Insufficient documentation

## 2016-10-23 DIAGNOSIS — I2510 Atherosclerotic heart disease of native coronary artery without angina pectoris: Secondary | ICD-10-CM | POA: Diagnosis not present

## 2016-10-23 DIAGNOSIS — Z8261 Family history of arthritis: Secondary | ICD-10-CM | POA: Diagnosis not present

## 2016-10-23 DIAGNOSIS — E785 Hyperlipidemia, unspecified: Secondary | ICD-10-CM | POA: Insufficient documentation

## 2016-10-23 DIAGNOSIS — J449 Chronic obstructive pulmonary disease, unspecified: Secondary | ICD-10-CM | POA: Insufficient documentation

## 2016-10-23 DIAGNOSIS — Z8049 Family history of malignant neoplasm of other genital organs: Secondary | ICD-10-CM | POA: Insufficient documentation

## 2016-10-23 DIAGNOSIS — Z951 Presence of aortocoronary bypass graft: Secondary | ICD-10-CM | POA: Diagnosis not present

## 2016-10-23 DIAGNOSIS — N189 Chronic kidney disease, unspecified: Secondary | ICD-10-CM | POA: Diagnosis not present

## 2016-10-23 DIAGNOSIS — Z85038 Personal history of other malignant neoplasm of large intestine: Secondary | ICD-10-CM | POA: Diagnosis not present

## 2016-10-23 DIAGNOSIS — I10 Essential (primary) hypertension: Secondary | ICD-10-CM | POA: Diagnosis not present

## 2016-10-23 DIAGNOSIS — K219 Gastro-esophageal reflux disease without esophagitis: Secondary | ICD-10-CM | POA: Insufficient documentation

## 2016-10-23 DIAGNOSIS — I150 Renovascular hypertension: Secondary | ICD-10-CM | POA: Diagnosis not present

## 2016-10-23 DIAGNOSIS — Z7982 Long term (current) use of aspirin: Secondary | ICD-10-CM | POA: Diagnosis not present

## 2016-10-23 DIAGNOSIS — Z7902 Long term (current) use of antithrombotics/antiplatelets: Secondary | ICD-10-CM | POA: Insufficient documentation

## 2016-10-23 DIAGNOSIS — Z90710 Acquired absence of both cervix and uterus: Secondary | ICD-10-CM | POA: Insufficient documentation

## 2016-10-23 HISTORY — PX: PERIPHERAL VASCULAR CATHETERIZATION: SHX172C

## 2016-10-23 SURGERY — RENAL ANGIOGRAPHY
Anesthesia: Moderate Sedation

## 2016-10-23 MED ORDER — HYDRALAZINE HCL 20 MG/ML IJ SOLN: 5.0000 mg | INTRAMUSCULAR | Status: DC | PRN

## 2016-10-23 MED ORDER — OXYCODONE-ACETAMINOPHEN 5-325 MG PO TABS: 1.0000 | ORAL_TABLET | ORAL | Status: DC | PRN

## 2016-10-23 MED ORDER — HEPARIN SODIUM (PORCINE) 1000 UNIT/ML IJ SOLN
INTRAMUSCULAR | Status: AC
  Filled 2016-10-23: qty 1

## 2016-10-23 MED ORDER — GUAIFENESIN-DM 100-10 MG/5ML PO SYRP: 15.0000 mL | ORAL_SOLUTION | ORAL | Status: DC | PRN

## 2016-10-23 MED ORDER — SODIUM CHLORIDE 0.9 % IV SOLN
INTRAVENOUS | Status: DC
  Administered 2016-10-23: 08:00:00 via INTRAVENOUS

## 2016-10-23 MED ORDER — ONDANSETRON HCL 4 MG/2ML IJ SOLN: 4.0000 mg | Freq: Four times a day (QID) | INTRAMUSCULAR | Status: DC | PRN

## 2016-10-23 MED ORDER — LABETALOL HCL 5 MG/ML IV SOLN: 10.0000 mg | INTRAVENOUS | Status: DC | PRN

## 2016-10-23 MED ORDER — MIDAZOLAM HCL 2 MG/2ML IJ SOLN
INTRAMUSCULAR | Status: DC | PRN
  Administered 2016-10-23: 2 mg via INTRAVENOUS
  Administered 2016-10-23: 1 mg via INTRAVENOUS

## 2016-10-23 MED ORDER — ACETAMINOPHEN 325 MG RE SUPP: 325.0000 mg | RECTAL | Status: DC | PRN

## 2016-10-23 MED ORDER — MIDAZOLAM HCL 5 MG/5ML IJ SOLN
INTRAMUSCULAR | Status: AC
  Filled 2016-10-23: qty 5

## 2016-10-23 MED ORDER — HEPARIN SODIUM (PORCINE) 1000 UNIT/ML IJ SOLN
INTRAMUSCULAR | Status: DC | PRN
  Administered 2016-10-23: 5000 [IU] via INTRAVENOUS

## 2016-10-23 MED ORDER — METOPROLOL TARTRATE 5 MG/5ML IV SOLN: 2.0000 mg | INTRAVENOUS | Status: DC | PRN

## 2016-10-23 MED ORDER — PHENOL 1.4 % MT LIQD
1.0000 | OROMUCOSAL | Status: DC | PRN
  Filled 2016-10-23: qty 177

## 2016-10-23 MED ORDER — FENTANYL CITRATE (PF) 100 MCG/2ML IJ SOLN
INTRAMUSCULAR | Status: DC | PRN
  Administered 2016-10-23: 50 ug via INTRAVENOUS
  Administered 2016-10-23: 25 ug via INTRAVENOUS

## 2016-10-23 MED ORDER — SODIUM CHLORIDE 0.9 % IV SOLN: 500.0000 mL | Freq: Once | INTRAVENOUS | Status: DC | PRN

## 2016-10-23 MED ORDER — HEPARIN (PORCINE) IN NACL 2-0.9 UNIT/ML-% IJ SOLN
INTRAMUSCULAR | Status: AC
  Filled 2016-10-23: qty 1000

## 2016-10-23 MED ORDER — ACETAMINOPHEN 325 MG PO TABS: 325.0000 mg | ORAL_TABLET | ORAL | Status: DC | PRN

## 2016-10-23 MED ORDER — CEFAZOLIN IN D5W 1 GM/50ML IV SOLN
1.0000 g | Freq: Once | INTRAVENOUS | Status: AC
  Administered 2016-10-23: 1 g via INTRAVENOUS

## 2016-10-23 MED ORDER — LIDOCAINE-EPINEPHRINE 1 %-1:100000 IJ SOLN
INTRAMUSCULAR | Status: AC
  Filled 2016-10-23: qty 1

## 2016-10-23 MED ORDER — IOPAMIDOL (ISOVUE-300) INJECTION 61%
INTRAVENOUS | Status: DC | PRN
  Administered 2016-10-23: 35 mL via INTRA_ARTERIAL

## 2016-10-23 MED ORDER — MANNITOL 25 % IV SOLN
INTRAVENOUS | Status: AC
  Filled 2016-10-23: qty 50

## 2016-10-23 MED ORDER — HYDROMORPHONE HCL 1 MG/ML IJ SOLN: 0.5000 mg | INTRAMUSCULAR | Status: DC | PRN

## 2016-10-23 MED ORDER — FENTANYL CITRATE (PF) 100 MCG/2ML IJ SOLN
INTRAMUSCULAR | Status: AC
  Filled 2016-10-23: qty 2

## 2016-10-23 SURGICAL SUPPLY — 12 items
CATH LIMA 6F (CATHETERS) ×3 IMPLANT
CATH PIG 70CM (CATHETERS) ×3 IMPLANT
DEVICE STARCLOSE SE CLOSURE (Vascular Products) ×3 IMPLANT
PACK ANGIOGRAPHY (CUSTOM PROCEDURE TRAY) ×3 IMPLANT
SHEATH BRITE TIP 5FRX11 (SHEATH) ×3 IMPLANT
SHEATH BRITE TIP 6FRX11 (SHEATH) ×3 IMPLANT
STENT HERCULINK RX 5.5X18X135 (Permanent Stent) ×3 IMPLANT
SYR MEDRAD MARK V 150ML (SYRINGE) ×3 IMPLANT
TUBING CONTRAST HIGH PRESS 72 (TUBING) ×3 IMPLANT
VALVE HEMO TOUHY BORST Y (VALVE) ×3 IMPLANT
WIRE J 3MM .035X145CM (WIRE) ×3 IMPLANT
WIRE SPARTACORE .014X190CM (WIRE) ×3 IMPLANT

## 2016-10-23 NOTE — Op Note (Signed)
[facility] Percutaneous Study/Intervention Procedural Note    Surgeon(s): [employer]  Assistants: None  Pre-operative Diagnosis: Right renal artery stenosis, renovascular hypertension  Post-operative diagnosis: Same with more significant left renal artery stenosis than duplex had suggested  Procedure(s) Performed: 1. Ultrasound guidance for vascular access right femoral artery 2. Catheter placement into right renal artery from right femoral approach 3. Aortogram and selective right renal angiogram 4. Balloon expandable stent placement to the right renal artery with a 5.5 mm diameter x 18 mm length stent which was balloon expandable 5. StarClose closure device right femoral artery  Contrast: 35 cc  EBL: Minimal   Fluoro Time: 2 minutes  Moderate conscious sedation: Approximately 20 minutes with 3 mg of Versed and 75 mcg of Fentanyl  Indications: The patient is a 65 year old female with worsening severe hypertension despite 3 medications. The patient has suboptimal blood pressure control despite multiple antihypertensives and a noninvasive study demonstrating hemodynamically significant right renal artery stenosis. Given the clinical scenario and the noninvasive findings, angiogram is indicated for further evaluation of her renal artery and potential treatment. Risks and benefits are discussed and informed consent is obtained.  Procedure: The patient was identified and appropriate procedural time out was performed. The patient was then placed supine on the table and prepped and draped in the usual sterile fashion.Moderate conscious sedation was administered with a face to face encounter with the patient throughout the procedure with my supervision of the RN administering medicines and monitoring the patients vital signs and mental status throughout from the start of the procedure  until the patient was taken to the recovery room  Ultrasound was used to evaluate the right common femoral artery. It was patent . A digital ultrasound image was acquired. A Seldinger needle was used to access the right common femoral artery under direct ultrasound guidance and a permanent image was performed. A 0.035 J wire was advanced without resistance and a 5Fr sheath was placed. Pigtail catheter was placed into the aorta at the L1 level and an AP aortogram was performed. This demonstrated an 85-90% right renal artery stenosis and what appeared to be a 70-75% left renal artery stenosis although selective imaging was not performed today. The patient was then systemically heparinized with 5000 units of intravenous heparin were given. I used a IM guide catheter to cannulate the right renal artery and selective imaging was performed. This confirmed a high-grade, 85-90% stenosis of the right renal artery.  At this point I selected the [place] core wire and crossed the lesion without difficulty.   I then selected a 5.5 mm diameter x 18 mm length balloon expandable stent and brought this across the lesion.  This was deployed encompassing the lesion with its proximal extent going back into the aorta for a mm or two.  This was inflated to 14 ATM and the waist resolved.  Completion angiogram showed a widely patent renal artery with less than 10% residual stenosis and a brisk nephrogram.   The guide catheter was removed. Oblique arteriogram was performed of the right femoral artery and StarClose closure device was deployed in the usual fashion with excellent hemostatic result. The patient was taken to the recovery room in stable condition having tolerated the procedure well.  Findings:  Aortogram/Renal Arteries:Very irregular aorta without stenosis. Iliac arteries had patent common iliac artery stents and no significant stenosis was identified right renal artery appeared to have stenosis in the  85-90% range. Left renal artery appeared to have stenosis  in the 70-75% range   Condition:  Stable  Complications: None   Leotis Pain 10/23/2016 9:15 AM  This note was created with Dragon Medical transcription system. Any errors in dictation are purely unintentional.

## 2016-10-23 NOTE — Progress Notes (Signed)
PAD deflated at 12:45, patient has ambulated with no complications. Plan to discharge per orders

## 2016-10-23 NOTE — Progress Notes (Signed)
Patient alert and oriented, reporting no pain throughout recovery, with right groin CDI, pulses WNL. Ambulation time at 10:55, patient up to BR and began bleeding from right groin site. Pressure held, patient back to stretcher and pressure held x 30 minutes. Slight ooze after 30 min pressure held, Dr Lucky Cowboy at bedside ordered PAD and additional 1 hour recovery time. Vital signs remained stable throughout episode. Patient resting quietly, right groin WNL with PAD in place. Will continue to monitor.

## 2016-10-23 NOTE — H&P (Signed)
[facility] History & Physical Update  The patient was interviewed and re-examined.  The patient's previous History and Physical has been reviewed and is unchanged.  There is no change in the plan of care. We plan to proceed with the scheduled procedure.  Leotis Pain, MD  10/23/2016, 8:12 AM

## 2016-10-24 ENCOUNTER — Encounter: Payer: Self-pay | Admitting: Vascular Surgery

## 2016-10-26 ENCOUNTER — Telehealth: Payer: Self-pay

## 2016-10-26 NOTE — Telephone Encounter (Signed)
-----   Message from Jonathon Bellows, MD sent at 10/11/2016 10:34 AM EST ----- 8 tubular adenomas- recall 3 years

## 2016-10-26 NOTE — Telephone Encounter (Signed)
Pt notified of colonoscopy results.  

## 2016-11-09 DIAGNOSIS — Z95810 Presence of automatic (implantable) cardiac defibrillator: Secondary | ICD-10-CM | POA: Diagnosis not present

## 2016-11-09 DIAGNOSIS — Z4502 Encounter for adjustment and management of automatic implantable cardiac defibrillator: Secondary | ICD-10-CM | POA: Diagnosis not present

## 2016-11-09 DIAGNOSIS — I5022 Chronic systolic (congestive) heart failure: Secondary | ICD-10-CM | POA: Diagnosis not present

## 2016-11-16 ENCOUNTER — Other Ambulatory Visit: Payer: Self-pay | Admitting: Internal Medicine

## 2016-11-16 NOTE — Telephone Encounter (Signed)
Wellbutrin last filled 10/23/16 60 1rf Alprazolam last filled 09/18/16 30 1rf

## 2016-11-17 NOTE — Telephone Encounter (Signed)
LAST VISIT?  NOV.  PLEASE START SUPPLYING.  OV NEEDED EVERY 6 MONTHS

## 2016-11-20 ENCOUNTER — Ambulatory Visit (INDEPENDENT_AMBULATORY_CARE_PROVIDER_SITE_OTHER): Payer: Medicare Other | Admitting: Vascular Surgery

## 2016-11-20 ENCOUNTER — Other Ambulatory Visit: Payer: Self-pay | Admitting: Internal Medicine

## 2016-11-20 ENCOUNTER — Encounter (INDEPENDENT_AMBULATORY_CARE_PROVIDER_SITE_OTHER): Payer: Self-pay | Admitting: Vascular Surgery

## 2016-11-20 ENCOUNTER — Other Ambulatory Visit (INDEPENDENT_AMBULATORY_CARE_PROVIDER_SITE_OTHER): Payer: Self-pay | Admitting: Vascular Surgery

## 2016-11-20 ENCOUNTER — Encounter (INDEPENDENT_AMBULATORY_CARE_PROVIDER_SITE_OTHER): Payer: Medicare Other

## 2016-11-20 VITALS — BP 169/104 | HR 72 | Resp 16 | Wt 179.0 lb

## 2016-11-20 DIAGNOSIS — I701 Atherosclerosis of renal artery: Secondary | ICD-10-CM

## 2016-11-20 DIAGNOSIS — E7800 Pure hypercholesterolemia, unspecified: Secondary | ICD-10-CM

## 2016-11-20 DIAGNOSIS — Z720 Tobacco use: Secondary | ICD-10-CM

## 2016-11-20 NOTE — Telephone Encounter (Signed)
Alprazolam faxed to pharmacy

## 2016-11-20 NOTE — Progress Notes (Signed)
Subjective:    Patient ID: Ashley [name], female    DOB: [birth year], 66 y.o.   MRN: [id number] Chief Complaint  Patient presents with  . Follow-up   Patient presents for her first post-procedure follow up. She is S/pa stent placement in the right renal artery. She presents today without complaint. The patient reports good TN control and no issues with their kidney function. Duplex today was notable for patent right renal artery, 65% left renal artery stenosis with patent renal veins.    Review of Systems  Constitutional: Negative.   HENT: Negative.   Eyes: Negative.   Respiratory: Negative.   Cardiovascular: Negative.   Gastrointestinal: Negative.   Endocrine: Negative.   Genitourinary: Negative.   Musculoskeletal: Negative.   Skin: Negative.   Allergic/Immunologic: Negative.   Neurological: Negative.   Hematological: Negative.   Psychiatric/Behavioral: Negative.       Objective:   Physical Exam  Constitutional: She is oriented to person, place, and time. She appears well-developed and well-nourished.  HENT:  Head: Normocephalic and atraumatic.  Right Ear: External ear normal.  Left Ear: External ear normal.  Eyes: Conjunctivae and EOM are normal. Pupils are equal, round, and reactive to light.  Neck: Normal range of motion.  Cardiovascular: Normal rate, regular rhythm, normal heart sounds and intact distal pulses.   Pulses:      Radial pulses are 2+ on the right side, and 2+ on the left side.       Dorsalis pedis pulses are 2+ on the right side, and 2+ on the left side.       Posterior tibial pulses are 2+ on the right side, and 2+ on the left side.  Pulmonary/Chest: Effort normal and breath sounds normal.  Abdominal: Soft. Bowel sounds are normal.  Musculoskeletal: Normal range of motion. She exhibits no edema.  Neurological: She is alert and oriented to person, place, and time.  Skin: Skin is warm and dry.  Psychiatric: She has a normal mood and affect. Her  behavior is normal. Judgment and thought content normal.   BP (!) 169/104   Pulse 72   Resp 16   Wt 179 lb (81.2 kg)   BMI 30.73 kg/m   Past Medical History:  Diagnosis Date  . AICD (automatic cardioverter/defibrillator) present    on right side  . CAD (coronary artery disease)    s/p CABG  . CHF (congestive heart failure) ([place])   . Chronic kidney disease    renal artery stenosis  . Colon cancer ([place])   . COPD (chronic obstructive pulmonary disease) ([place])   . Depression   . Dyspnea   . GERD (gastroesophageal reflux disease)   . Hx of colonic polyps   . Hyperlipidemia   . Hypertension   . Mitral valve disorder    s/p mitral valve repair wth CABG  . Myocardial infarction   . Peripheral vascular disease [facility])    Social History   Social History  . Marital status: Widowed    Spouse name: N/A  . Number of children: 0  . Years of education: N/A   Occupational History  . Not on file.   Social History Main Topics  . Smoking status: Current Every Day Smoker    Packs/day: 0.50    Types: Cigarettes  . Smokeless tobacco: Never Used     Comment: 1/2-1 ppd  . Alcohol use 1.2 oz/week    2 Shots of liquor per week     Comment: Occasional last drink  thansgiving. vodka and oj  . Drug use: No  . Sexual activity: Yes   Other Topics Concern  . Not on file   Social History Narrative  . No narrative on file   Past Surgical History:  Procedure Laterality Date  . arm surgery     fracture, has plates and screws  . CABG with mitral valve repair    . COLON SURGERY     colon cancer  . COLONOSCOPY WITH PROPOFOL N/A 10/09/2016   Procedure: COLONOSCOPY WITH PROPOFOL;  Surgeon: Jonathon Bellows, MD;  Location: [facility] ENDOSCOPY;  Service: Endoscopy;  Laterality: N/A;  . CORONARY ARTERY BYPASS GRAFT    . pace maker defib  2016  . PERIPHERAL VASCULAR CATHETERIZATION N/A 10/23/2016   Procedure: Renal Angiography;  Surgeon: Algernon Huxley, MD;  Location: [place] CV LAB;  Service:  Cardiovascular;  Laterality: N/A;  . TOTAL ABDOMINAL HYSTERECTOMY  1999   history of abnormal pap   Family History  Problem Relation Age of Onset  . Arthritis Mother   . Cancer Mother     uterus cancer  . Hyperlipidemia Mother   . Hypertension Mother   . Heart disease Mother   . Diabetes Mother   . Hyperlipidemia Father   . Hypertension Father   . Heart disease Father   . Diabetes Father   . Cancer Sister     ovary cancer  . Diabetes Maternal Grandmother   . Hypertension Maternal Grandmother   . Arthritis Maternal Grandmother   . Hypertension Maternal Grandfather   . Hypertension Paternal Grandmother   . Hypertension Paternal Grandfather   . Heart disease Paternal Grandfather    Allergies  Allergen Reactions  . Hydrocodone-Acetaminophen Nausea Only      Assessment & Plan:  Patient presents for her first post-procedure follow up. She is S/pa stent placement in the right renal artery. She presents today without complaint. The patient reports good TN control and no issues with their kidney function. Duplex today was notable for patent right renal artery, 65% left renal artery stenosis with patent renal veins.  1. Renal artery stenosis (HCC) - Improvement Improvement in renal duplex since intervention.  Patient states improvement in BP however today is 169/104. I have discussed with the patient at length the risk factors for and pathogenesis of atherosclerotic disease and encouraged a healthy diet, regular exercise regimen and blood pressure / glucose control.  Patient was instructed to contact our office in the interim with problems such as increasing / uncontrollable hypertension or changes in kidney function. The patient expresses their understanding.  - VAS US RENAL ARTERY DUPLEX; Future  2. Hypercholesterolemia - Stable Encouraged good control as its slows the progression of atherosclerotic disease  3. Tobacco abuse - Stable I have discussed (approximately 5 minutes)  with the patient the role of tobacco in the pathogenesis of atherosclerosis and its effect on the progression of the disease, impact on the durability of interventions and its limitations on the formation of collateral pathways. I have recommended absolute tobacco cessation. I have discussed various options available for assistance with tobacco cessation including over the counter methods (Nicotine gum, patch and lozenges). We also discussed prescription options (Chantix, Nicotine Inhaler / Nasal Spray). The patient is not interested in pursuing any prescription tobacco cessation options at this time. The patient voices their understanding.   Current Outpatient Prescriptions on File Prior to Visit  Medication Sig Dispense Refill  . acetaminophen (TYLENOL) 500 MG tablet Maximum 6 tablets a day. (Patient  taking differently: Take 500 mg by mouth every 4 (four) hours as needed for fever. Maximum 6 tablets a day.) 42 tablet 0  . albuterol (PROVENTIL HFA;VENTOLIN HFA) 108 (90 BASE) MCG/ACT inhaler Inhale 2 puffs into the lungs every 6 (six) hours as needed for wheezing.    [name] ALPRAZolam (XANAX) 0.5 MG tablet TAKE ONE TABLET BY MOUTH AT BEDTIME AS NEEDED FOR ANXIETY 30 tablet 3  . amLODipine (NORVASC) 5 MG tablet Take 1 tablet (5 mg total) by mouth daily. 90 tablet 3  . aspirin 81 MG tablet Take 81 mg by mouth daily.    [name] atorvastatin (LIPITOR) 40 MG tablet Take 1 tablet (40 mg total) by mouth daily. 90 tablet 3  . buPROPion (WELLBUTRIN SR) 100 MG 12 hr tablet TAKE ONE TABLET BY MOUTH TWICE DAILY 60 tablet 5  . carvedilol (COREG) 6.25 MG tablet TAKE ONE TABLET BY MOUTH TWICE DAILY WITH MEAL 180 tablet 2  . clopidogrel (PLAVIX) 75 MG tablet TAKE ONE TABLET BY MOUTH EVERY DAY 90 tablet 2  . escitalopram (LEXAPRO) 10 MG tablet TAKE ONE TABLET BY MOUTH EVERY DAY 90 tablet 2  . Fluticasone-Salmeterol (ADVAIR DISKUS) 250-50 MCG/DOSE AEPB INHALE 1 PUFF TWICE DAILY **RINSE MOUTH AFTER EACH USE** 180 each 2  .  furosemide (LASIX) 20 MG tablet TAKE ONE TABLET BY MOUTH EVERY DAY 90 tablet 2  . lisinopril (PRINIVIL,ZESTRIL) 20 MG tablet TAKE ONE TABLET BY MOUTH EVERY DAY 90 tablet 2  . nitroGLYCERIN (NITROSTAT) 0.4 MG SL tablet Place 1 tablet (0.4 mg total) under the tongue every 5 (five) minutes as needed for chest pain. 30 tablet 3  . omeprazole (PRILOSEC) 40 MG capsule TAKE ONE CAPSULE BY MOUTH EVERY DAY 90 capsule 2  . Tiotropium Bromide Monohydrate (SPIRIVA RESPIMAT) 2.5 MCG/ACT AERS INHALE TWO SPRAYS BY MOUTH ONCE DAILY 4 g 6   No current facility-administered medications on file prior to visit.     There are no Patient Instructions on file for this visit. No Follow-up on file.   Patte Winkel A Amante Fomby, PA-C

## 2016-11-27 ENCOUNTER — Ambulatory Visit (INDEPENDENT_AMBULATORY_CARE_PROVIDER_SITE_OTHER): Payer: Medicare Other | Admitting: Internal Medicine

## 2016-11-27 ENCOUNTER — Encounter: Payer: Self-pay | Admitting: Internal Medicine

## 2016-11-27 VITALS — BP 150/110 | HR 96 | Temp 98.0°F | Resp 16 | Ht 64.0 in | Wt 178.2 lb

## 2016-11-27 DIAGNOSIS — J441 Chronic obstructive pulmonary disease with (acute) exacerbation: Secondary | ICD-10-CM | POA: Diagnosis not present

## 2016-11-27 DIAGNOSIS — I10 Essential (primary) hypertension: Secondary | ICD-10-CM

## 2016-11-27 DIAGNOSIS — I701 Atherosclerosis of renal artery: Secondary | ICD-10-CM | POA: Diagnosis not present

## 2016-11-27 MED ORDER — BENZONATATE 200 MG PO CAPS: 200.0000 mg | ORAL_CAPSULE | Freq: Three times a day (TID) | ORAL | 1 refills | Status: DC | PRN

## 2016-11-27 MED ORDER — LEVOFLOXACIN 500 MG PO TABS: 500.0000 mg | ORAL_TABLET | Freq: Every day | ORAL | 0 refills | Status: DC

## 2016-11-27 MED ORDER — PREDNISONE 10 MG PO TABS: ORAL_TABLET | ORAL | 0 refills | Status: DC

## 2016-11-27 MED ORDER — IPRATROPIUM-ALBUTEROL 0.5-2.5 (3) MG/3ML IN SOLN
3.0000 mL | Freq: Four times a day (QID) | RESPIRATORY_TRACT | Status: DC
  Administered 2016-11-27: 3 mL via RESPIRATORY_TRACT

## 2016-11-27 MED ORDER — METHYLPREDNISOLONE ACETATE 40 MG/ML IJ SUSP
40.0000 mg | Freq: Once | INTRAMUSCULAR | Status: AC
  Administered 2016-11-27: 40 mg via INTRAMUSCULAR

## 2016-11-27 NOTE — Progress Notes (Signed)
Pre-visit discussion using our clinic review tool. No additional management support is needed unless otherwise documented below in the visit note.  

## 2016-11-27 NOTE — Patient Instructions (Signed)
1)  You are having a COPD exacerbation   If you become more short of breath than you are now,  You need to go to the nearest ER immediately or call 911  I am treating you  with the following:  Prednisone 60 MG DAILY FOR 3 DAYS,  THEN START tapering dose for the next 6 days   Levaquin once daily  For   7 days (antibiotic) Continue your Advair twice daily  And yoru Spiriva once daly .  Use your albuterol inhaler as needed Take Delsym for cough or tesslon perles (capsules)    NeilMed's sinus rinse can be used daily to flush sinuses (use sterile or bottled water ,  Not tap)    Please take a probiotic ( Align, Floraque or Culturelle), or  the generic version of one of these  For a minimum of 3 weeks to prevent a serious antibiotic associated diarrhea  Called clostridium dificile colitis  .     2) YOUR BLOOD PRESSURE SHOULD BE 120/70 TO BE AT GOAL.  PLEASE RESUME YOUR MEDICATIONS AND RETURN IN ONE WEEK WITH YOUR HOME MACHINE SO WE CAN CHECK IT AGAINST OURS.

## 2016-11-27 NOTE — Progress Notes (Signed)
Subjective:  Patient ID: Ashley [name], female    DOB: 1951-02-14  Age: 66 y.o. MRN: [id number]  CC: The primary encounter diagnosis was COPD exacerbation ([place]). Diagnoses of Essential hypertension and Renal artery stenosis ([place]) were also pertinent to this visit.  HPI IRAIS MOTTRAM presents for EVALUATION OF COUGH PRODUCTIVE OF PURULENT SPUTUM.  The cough has been present for 3 weeks, she has noted wheezing,  Worse in the evenings and in the morning.  Has been taking otc cold remedies including ALKA SELTZER COLD AND FLU with no significant relief of symptoms.  Still smoking,  Due to increased emotional stress brought on by the sudden unexpected death of her younger sister,    SISTER DIED IN HER SLEEP AT AGE 89 [place].   DIED IN HER SLEEP .  NO AUTOPSY.   PAD/hypertension:  Renal artery stenosis diagnosed,  S/p right renal artyer stent in dec by Jason dew. bp is still  ELEVATED.  HAS BEEN OUT OF BP MEDICATION FOR 5 DAYS.  HOME BP WAS 135/85 WITHOUT MEDICATIONS/   Outpatient Medications Prior to Visit  Medication Sig Dispense Refill  . acetaminophen (TYLENOL) 500 MG tablet Maximum 6 tablets a day. (Patient taking differently: Take 500 mg by mouth every 4 (four) hours as needed for fever. Maximum 6 tablets a day.) 42 tablet 0  . ADVAIR DISKUS 250-50 MCG/DOSE AEPB INHALE 1 PUFF BY MOUTH TWICE DAILY - RINSE MOUTH AFTER EASCH USE 180 each 2  . ALPRAZolam (XANAX) 0.5 MG tablet TAKE ONE TABLET BY MOUTH AT BEDTIME AS NEEDED FOR ANXIETY 30 tablet 3  . amLODipine (NORVASC) 5 MG tablet Take 1 tablet (5 mg total) by mouth daily. 90 tablet 3  . aspirin 81 MG tablet Take 81 mg by mouth daily.    [name] atorvastatin (LIPITOR) 40 MG tablet Take 1 tablet (40 mg total) by mouth daily. 90 tablet 3  . buPROPion (WELLBUTRIN SR) 100 MG 12 hr tablet TAKE ONE TABLET BY MOUTH TWICE DAILY 60 tablet 5  . carvedilol (COREG) 6.25 MG tablet TAKE ONE TABLET BY MOUTH TWICE DAILY WITH MEAL 180 tablet 2  .  clopidogrel (PLAVIX) 75 MG tablet TAKE ONE TABLET BY MOUTH EVERY DAY 90 tablet 2  . escitalopram (LEXAPRO) 10 MG tablet TAKE ONE TABLET BY MOUTH EVERY DAY 90 tablet 2  . Fluticasone-Salmeterol (ADVAIR DISKUS) 250-50 MCG/DOSE AEPB INHALE 1 PUFF TWICE DAILY **RINSE MOUTH AFTER EACH USE** 180 each 2  . furosemide (LASIX) 20 MG tablet TAKE ONE TABLET BY MOUTH EVERY DAY 90 tablet 2  . lisinopril (PRINIVIL,ZESTRIL) 20 MG tablet TAKE ONE TABLET BY MOUTH EVERY DAY 90 tablet 2  . nitroGLYCERIN (NITROSTAT) 0.4 MG SL tablet Place 1 tablet (0.4 mg total) under the tongue every 5 (five) minutes as needed for chest pain. 30 tablet 3  . omeprazole (PRILOSEC) 40 MG capsule TAKE ONE CAPSULE BY MOUTH EVERY DAY 90 capsule 2  . PROAIR HFA 108 (90 Base) MCG/ACT inhaler Inhale 2 inhalations into the lungs every 6 (six) hours as needed for Wheezing. 8.5 g 2  . Tiotropium Bromide Monohydrate (SPIRIVA RESPIMAT) 2.5 MCG/ACT AERS INHALE TWO SPRAYS BY MOUTH ONCE DAILY 4 g 6   No facility-administered medications prior to visit.     Review of Systems;  Patient denies headache, fevers, malaise, unintentional weight loss, skin rash, eye pain,  sinus pain, sore throat, dysphagia,  hemoptysis , chest pain, palpitations, orthopnea, edema, abdominal pain, nausea, melena, diarrhea, constipation, flank pain,  dysuria, hematuria, urinary  Frequency, nocturia, numbness, tingling, seizures,  Focal weakness, Loss of consciousness,  Tremor, insomnia, depression,  and suicidal ideation.      Objective:  BP (!) 150/110   Pulse 96   Temp 98 F (36.7 C) (Oral)   Resp 16   Ht 5\' 4"  (1.626 m)   Wt 178 lb 4 oz (80.9 kg)   SpO2 95%   BMI 30.60 kg/m   BP Readings from Last 3 Encounters:  11/27/16 (!) 150/110  11/20/16 (!) 169/104  10/23/16 (!) 143/72    Wt Readings from Last 3 Encounters:  11/27/16 178 lb 4 oz (80.9 kg)  11/20/16 179 lb (81.2 kg)  10/23/16 176 lb (79.8 kg)    General appearance: alert, cooperative and  appears older than  stated age Ears: normal TM's and external ear canals both ears Throat: lips, mucosa, and tongue normal; teeth and gums normal Neck: no adenopathy, no carotid bruit, supple, symmetrical, trachea midline and thyroid not enlarged, symmetric, no tenderness/mass/nodules Back: symmetric, no curvature. ROM normal. No CVA tenderness. Lungs: bilaterally wheezing,  Ronchi,  Without egophony Heart: regular rate and rhythm, S1, S2 normal, no murmur, click, rub or gallop Abdomen: soft, non-tender; bowel sounds normal; no masses,  no organomegaly Pulses: 2+ and symmetric Skin: Skin color, texture, turgor normal. No rashes or lesions Lymph nodes: Cervical, supraclavicular, and axillary nodes normal.  Lab Results  Component Value Date   HGBA1C 5.7 06/21/2016    Lab Results  Component Value Date   CREATININE 1.00 10/10/2016   CREATININE 0.97 06/28/2016   CREATININE 1.32 (H) 06/21/2016    Lab Results  Component Value Date   WBC 10.5 06/20/2016   HGB 12.6 06/20/2016   HCT 36.4 06/20/2016   PLT 228 06/20/2016   GLUCOSE 98 06/28/2016   CHOL 229 (H) 06/06/2016   TRIG (H) 06/06/2016    477.0 Triglyceride is over 400; calculations on Lipids are invalid.   HDL 35.30 (L) 06/06/2016   LDLDIRECT 155.0 06/06/2016   LDLCALC 90 10/09/2014   ALT 37 (H) 06/06/2016   AST 31 06/06/2016   NA 141 06/28/2016   K 3.8 06/28/2016   CL 107 06/28/2016   CREATININE 1.00 10/10/2016   BUN 11 10/10/2016   CO2 25 06/28/2016   TSH 2.301 06/21/2016   INR 1.1 08/19/2014   HGBA1C 5.7 06/21/2016    No results found.  Assessment & Plan:   Problem List Items Addressed This Visit    COPD exacerbation ([place]) - Primary    Duoneb and depomedrol given in office.  Steroid taper ,levaquin, and cough suppressant given.       Relevant Medications   methylPREDNISolone acetate (DEPO-MEDROL) injection 40 mg (Completed)   ipratropium-albuterol (DUONEB) 0.5-2.5 (3) MG/3ML nebulizer solution 3 mL    predniSONE (DELTASONE) 10 MG tablet   benzonatate (TESSALON) 200 MG capsule   Other Relevant Orders   CBC with Differential/Platelet   Renal artery stenosis (HCC)    S/p right renal artery stent placement dec 2017 by dew.  Hypertension improving but not at goal . patient has not taken med's today. rtc one week for bp check and home bp machine comparison        Other Visit Diagnoses    Essential hypertension       Relevant Orders   Comprehensive metabolic panel      I am having Ms. Jenison start on predniSONE, levofloxacin, and benzonatate. I am also having her maintain her aspirin, atorvastatin, acetaminophen, Tiotropium  Bromide Monohydrate, nitroGLYCERIN, Fluticasone-Salmeterol, amLODipine, buPROPion, ALPRAZolam, lisinopril, omeprazole, clopidogrel, escitalopram, carvedilol, furosemide, PROAIR HFA, and ADVAIR DISKUS. We administered methylPREDNISolone acetate and ipratropium-albuterol. We will continue to administer ipratropium-albuterol.  Meds ordered this encounter  Medications  . methylPREDNISolone acetate (DEPO-MEDROL) injection 40 mg  . ipratropium-albuterol (DUONEB) 0.5-2.5 (3) MG/3ML nebulizer solution 3 mL  . predniSONE (DELTASONE) 10 MG tablet    Sig: 6 tablets DAILY FOR 3 DAYS,  , then reduce by 1 tablet daily until gone    Dispense:  33 tablet    Refill:  0  . levofloxacin (LEVAQUIN) 500 MG tablet    Sig: Take 1 tablet (500 mg total) by mouth daily.    Dispense:  7 tablet    Refill:  0  . benzonatate (TESSALON) 200 MG capsule    Sig: Take 1 capsule (200 mg total) by mouth 3 (three) times daily as needed for cough.    Dispense:  60 capsule    Refill:  1    There are no discontinued medications.  Follow-up: Return in about 1 week (around 12/04/2016), or RN VISIT FOR BP CHECK BRING HOME MACHINE.   [name], MD

## 2016-11-28 LAB — COMPREHENSIVE METABOLIC PANEL
ALT: 32 U/L (ref 0–35)
AST: 33 U/L (ref 0–37)
Albumin: 4.5 g/dL (ref 3.5–5.2)
Alkaline Phosphatase: 92 U/L (ref 39–117)
BUN: 13 mg/dL (ref 6–23)
CO2: 25 meq/L (ref 19–32)
Calcium: 10.4 mg/dL (ref 8.4–10.5)
Chloride: 101 mEq/L (ref 96–112)
Creatinine, Ser: 0.99 mg/dL (ref 0.40–1.20)
GFR: 60.1 mL/min (ref >60.00)
Glucose, Bld: 152 mg/dL — ABNORMAL HIGH (ref 70–99)
POTASSIUM: 4.3 meq/L (ref 3.5–5.1)
SODIUM: 139 meq/L (ref 135–145)
Total Bilirubin: 0.4 mg/dL (ref 0.2–1.2)
Total Protein: 7.5 g/dL (ref 6.0–8.3)

## 2016-11-28 LAB — CBC WITH DIFFERENTIAL/PLATELET
BASOS PCT: 0.5 % (ref 0.0–3.0)
Basophils Absolute: 0.1 10*3/uL (ref 0.0–0.1)
EOS PCT: 2 % (ref 0.0–5.0)
Eosinophils Absolute: 0.2 10*3/uL (ref 0.0–0.7)
HCT: 43.8 % (ref 36.0–46.0)
Hemoglobin: 15.1 g/dL — ABNORMAL HIGH (ref 12.0–15.0)
Lymphocytes Relative: 24.3 % (ref 12.0–46.0)
Lymphs Abs: 2.7 10*3/uL (ref 0.7–4.0)
MCHC: 34.5 g/dL (ref 30.0–36.0)
MCV: 94.9 fl (ref 78.0–100.0)
MONOS PCT: 10.2 % (ref 3.0–12.0)
Monocytes Absolute: 1.1 10*3/uL — ABNORMAL HIGH (ref 0.1–1.0)
NEUTROS ABS: 7 10*3/uL (ref 1.4–7.7)
NEUTROS PCT: 63 % (ref 43.0–77.0)
PLATELETS: 349 10*3/uL (ref 150.0–400.0)
RBC: 4.61 Mil/uL (ref 3.87–5.11)
RDW: 14.1 % (ref 11.5–15.5)
WBC: 11 10*3/uL — ABNORMAL HIGH (ref 4.0–10.5)

## 2016-11-28 NOTE — Assessment & Plan Note (Signed)
Duoneb and depomedrol given in office.  Steroid taper ,levaquin, and cough suppressant given.

## 2016-11-28 NOTE — Assessment & Plan Note (Addendum)
S/p right renal artery stent placement dec 2017 by dew.  Hypertension improving but not at goal . patient has not taken med's today. rtc one week for bp check and home bp machine comparison

## 2016-11-29 ENCOUNTER — Encounter: Payer: Self-pay | Admitting: Internal Medicine

## 2016-12-06 ENCOUNTER — Ambulatory Visit (INDEPENDENT_AMBULATORY_CARE_PROVIDER_SITE_OTHER): Payer: Medicare Other

## 2016-12-06 VITALS — BP 118/74 | HR 77 | Resp 18

## 2016-12-06 DIAGNOSIS — I10 Essential (primary) hypertension: Secondary | ICD-10-CM | POA: Diagnosis not present

## 2016-12-06 NOTE — Progress Notes (Signed)
Patient comes in for 1 week blood pressure check.  She brings in home blood pressure machine checked with left arm reading is 117/74.   Patient reports she is taking blood pressure medicines.  Please advise.

## 2016-12-07 ENCOUNTER — Telehealth: Payer: Self-pay | Admitting: Internal Medicine

## 2016-12-07 NOTE — Progress Notes (Signed)
Reading using the Home machine  is at goal, but I need the simultaneous reading obtained on the office equipment before I can make any changes.

## 2016-12-07 NOTE — Telephone Encounter (Signed)
Patient advised and verbalized an understanding

## 2016-12-07 NOTE — Telephone Encounter (Signed)
Per our discussion  No changes to medications for BP are needed at this time

## 2016-12-14 ENCOUNTER — Telehealth: Payer: Self-pay | Admitting: Internal Medicine

## 2016-12-14 ENCOUNTER — Ambulatory Visit
Admission: RE | Admit: 2016-12-14 | Discharge: 2016-12-14 | Disposition: A | Discharge status: Home / Self Care | Payer: Medicare Other | Source: Ambulatory Visit | Attending: Internal Medicine | Admitting: Internal Medicine

## 2016-12-14 DIAGNOSIS — Z1231 Encounter for screening mammogram for malignant neoplasm of breast: Secondary | ICD-10-CM | POA: Diagnosis not present

## 2016-12-14 DIAGNOSIS — Z1239 Encounter for other screening for malignant neoplasm of breast: Secondary | ICD-10-CM

## 2016-12-14 NOTE — Telephone Encounter (Signed)
Need patient medication benefit card name and BIN, PCN and group number to file PA for medication Spiriva.

## 2016-12-14 NOTE — Telephone Encounter (Signed)
Pt requested a call at [phone]

## 2016-12-19 NOTE — Progress Notes (Signed)
See telephone note dated 12/07/16 . Patient advised per Dr Derrel Nip.

## 2016-12-22 NOTE — Telephone Encounter (Signed)
PA for Spiriva Respimat started and completed on Cover my meds awaiting approval.

## 2016-12-22 NOTE — Telephone Encounter (Signed)
Bin [id number] pcn NEDDaet [id number] [phone].

## 2016-12-28 NOTE — Telephone Encounter (Signed)
Rec fax stating Spiriva Respimat PA  is approved from 11/04/16 until 11/05/2017.

## 2017-01-11 ENCOUNTER — Telehealth: Payer: Self-pay

## 2017-01-11 NOTE — Telephone Encounter (Signed)
LMTCB. Need to let pt know that her Spiriva Respimat inhaler has been approved for one month.

## 2017-02-14 ENCOUNTER — Ambulatory Visit (INDEPENDENT_AMBULATORY_CARE_PROVIDER_SITE_OTHER): Payer: Medicare Other | Admitting: Vascular Surgery

## 2017-02-14 ENCOUNTER — Encounter (INDEPENDENT_AMBULATORY_CARE_PROVIDER_SITE_OTHER): Payer: Self-pay | Admitting: Vascular Surgery

## 2017-02-14 ENCOUNTER — Ambulatory Visit (INDEPENDENT_AMBULATORY_CARE_PROVIDER_SITE_OTHER): Payer: Medicare Other

## 2017-02-14 VITALS — BP 138/98 | HR 81 | Resp 17 | Wt 181.0 lb

## 2017-02-14 DIAGNOSIS — I701 Atherosclerosis of renal artery: Secondary | ICD-10-CM

## 2017-02-14 DIAGNOSIS — Z720 Tobacco use: Secondary | ICD-10-CM

## 2017-02-14 DIAGNOSIS — I150 Renovascular hypertension: Secondary | ICD-10-CM | POA: Diagnosis not present

## 2017-02-14 DIAGNOSIS — E7800 Pure hypercholesterolemia, unspecified: Secondary | ICD-10-CM

## 2017-02-14 NOTE — Progress Notes (Signed)
Subjective:    Patient ID: Ashley [name], female    DOB: [birth year], 66 y.o.   MRN: [id number] Chief Complaint  Patient presents with  . Follow-up   The patient presents for a three month renal artery stenosis follow up. The patient is s/p a right renal artery stent placement on 10/23/16. The patient underwent a renal artery duplex exam which was notable a patent right artery stent and >60% stenosis in the left renal artery. The patient reports good HTN control and no issues with their kidney function. The patient continues a regimen of ASA, Plavix, antihypertensives and dyslipidemia medication.    Review of Systems  Constitutional: Negative.   HENT: Negative.   Eyes: Negative.   Respiratory: Negative.   Cardiovascular: Negative.   Gastrointestinal: Negative.   Endocrine: Negative.   Genitourinary: Negative.   Musculoskeletal: Negative.   Skin: Negative.   Allergic/Immunologic: Negative.   Neurological: Negative.   Hematological: Negative.   Psychiatric/Behavioral: Negative.       Objective:   Physical Exam  Constitutional: She is oriented to person, place, and time. She appears well-developed and well-nourished. No distress.  HENT:  Head: Normocephalic and atraumatic.  Eyes: Conjunctivae are normal. Pupils are equal, round, and reactive to light.  Neck: Normal range of motion.  Cardiovascular: Normal rate, regular rhythm and normal heart sounds.   Pulses:      Radial pulses are 2+ on the right side, and 2+ on the left side.  Pulmonary/Chest: Effort normal.  Musculoskeletal: Normal range of motion. She exhibits no edema.  Neurological: She is alert and oriented to person, place, and time.  Skin: Skin is warm and dry. She is not diaphoretic.  Psychiatric: She has a normal mood and affect. Her behavior is normal. Judgment and thought content normal.  Vitals reviewed.  BP (!) 138/98   Pulse 81   Resp 17   Wt 181 lb (82.1 kg)   BMI 31.07 kg/m   Past Medical  History:  Diagnosis Date  . AICD (automatic cardioverter/defibrillator) present    on right side  . CAD (coronary artery disease)    s/p CABG  . CHF (congestive heart failure) ([place])   . Chronic kidney disease    renal artery stenosis  . Colon cancer ([place])   . COPD (chronic obstructive pulmonary disease) ([place])   . Depression   . Dyspnea   . GERD (gastroesophageal reflux disease)   . Hx of colonic polyps   . Hyperlipidemia   . Hypertension   . Mitral valve disorder    s/p mitral valve repair wth CABG  . Myocardial infarction   . Peripheral vascular disease [facility])    Social History   Social History  . Marital status: Widowed    Spouse name: N/A  . Number of children: 0  . Years of education: N/A   Occupational History  . Not on file.   Social History Main Topics  . Smoking status: Current Every Day Smoker    Packs/day: 0.50    Types: Cigarettes  . Smokeless tobacco: Never Used     Comment: 1/2-1 ppd  . Alcohol use 1.2 oz/week    2 Shots of liquor per week     Comment: Occasional last drink thansgiving. vodka and oj  . Drug use: No  . Sexual activity: Yes   Other Topics Concern  . Not on file   Social History Narrative  . No narrative on file   Past Surgical History:  Procedure Laterality  Date  . arm surgery     fracture, has plates and screws  . CABG with mitral valve repair    . COLON SURGERY     colon cancer  . COLONOSCOPY WITH PROPOFOL N/A 10/09/2016   Procedure: COLONOSCOPY WITH PROPOFOL;  Surgeon: Jonathon Bellows, MD;  Location: [facility] ENDOSCOPY;  Service: Endoscopy;  Laterality: N/A;  . CORONARY ARTERY BYPASS GRAFT    . pace maker defib  2016  . PERIPHERAL VASCULAR CATHETERIZATION N/A 10/23/2016   Procedure: Renal Angiography;  Surgeon: Algernon Huxley, MD;  Location: [place] [facility];  Service: Cardiovascular;  Laterality: N/A;  . TOTAL ABDOMINAL HYSTERECTOMY  1999   history of abnormal pap   Family History  Problem Relation Age of Onset  . Arthritis  Mother   . Cancer Mother     uterus cancer  . Hyperlipidemia Mother   . Hypertension Mother   . Heart disease Mother   . Diabetes Mother   . Hyperlipidemia Father   . Hypertension Father   . Heart disease Father   . Diabetes Father   . Cancer Sister     ovary cancer  . Diabetes Maternal Grandmother   . Hypertension Maternal Grandmother   . Arthritis Maternal Grandmother   . Hypertension Maternal Grandfather   . Hypertension Paternal Grandmother   . Hypertension Paternal Grandfather   . Heart disease Paternal Grandfather   . Breast cancer Neg Hx    Allergies  Allergen Reactions  . Hydrocodone-Acetaminophen Nausea Only      Assessment & Plan:  The patient presents for a three month renal artery stenosis follow up. The patient is s/p a right renal artery stent placement on 10/23/16. The patient underwent a renal artery duplex exam which was notable a patent right artery stent and >60% stenosis in the left renal artery. The patient reports good HTN control and no issues with their kidney function. The patient continues a regimen of ASA, Plavix, antihypertensives and dyslipidemia medication.   1. Renal artery stenosis (HCC) - Stable Studies reviewed. Asymptomatic, physical exam unremarkable, duplex notable for patent right renal stent and >60% stenosis in the left renal artery.  Patient to continue medical optimization with ASA, plavix, antihypertensives and dyslipidemia medication. We will continue to surveil with a renal duplex and will see the patient back in six months. I have discussed with the patient at length the risk factors for and pathogenesis of atherosclerotic disease and encouraged a healthy diet, regular exercise regimen and blood pressure / glucose control.  Patient was instructed to contact our office in the interim with problems such as increasing / uncontrollable hypertension or changes in kidney function. The patient expresses their understanding.  - VAS US RENAL  ARTERY DUPLEX; Future  2. Hypercholesterolemia - Stable On ASA, plavix and statin. Encouraged good control as its slows the progression of atherosclerotic disease.  3. Tobacco abuse - Stable I have discussed (approximately 5 minutes) with the patient the role of tobacco in the pathogenesis of atherosclerosis and its effect on the progression of the disease, impact on the durability of interventions and its limitations on the formation of collateral pathways. I have recommended absolute tobacco cessation. I have discussed various options available for assistance with tobacco cessation including over the counter methods (Nicotine gum, patch and lozenges). We also discussed prescription options (Chantix, Nicotine Inhaler / Nasal Spray). The patient is not interested in pursuing any prescription tobacco cessation options at this time. The patient voices their understanding.   4.  Renovascular hypertension - Stable Improved since intervention. Stable.   Current Outpatient Prescriptions on File Prior to Visit  Medication Sig Dispense Refill  . acetaminophen (TYLENOL) 500 MG tablet Maximum 6 tablets a day. (Patient taking differently: Take 500 mg by mouth every 4 (four) hours as needed for fever. Maximum 6 tablets a day.) 42 tablet 0  . ADVAIR DISKUS 250-50 MCG/DOSE AEPB INHALE 1 PUFF BY MOUTH TWICE DAILY - RINSE MOUTH AFTER EASCH USE 180 each 2  . ALPRAZolam (XANAX) 0.5 MG tablet TAKE ONE TABLET BY MOUTH AT BEDTIME AS NEEDED FOR ANXIETY 30 tablet 3  . amLODipine (NORVASC) 5 MG tablet Take 1 tablet (5 mg total) by mouth daily. 90 tablet 3  . aspirin 81 MG tablet Take 81 mg by mouth daily.    [name] atorvastatin (LIPITOR) 40 MG tablet Take 1 tablet (40 mg total) by mouth daily. 90 tablet 3  . benzonatate (TESSALON) 200 MG capsule Take 1 capsule (200 mg total) by mouth 3 (three) times daily as needed for cough. 60 capsule 1  . buPROPion (WELLBUTRIN SR) 100 MG 12 hr tablet TAKE ONE TABLET BY MOUTH TWICE DAILY 60  tablet 5  . carvedilol (COREG) 6.25 MG tablet TAKE ONE TABLET BY MOUTH TWICE DAILY WITH MEAL 180 tablet 2  . clopidogrel (PLAVIX) 75 MG tablet TAKE ONE TABLET BY MOUTH EVERY DAY 90 tablet 2  . escitalopram (LEXAPRO) 10 MG tablet TAKE ONE TABLET BY MOUTH EVERY DAY 90 tablet 2  . Fluticasone-Salmeterol (ADVAIR DISKUS) 250-50 MCG/DOSE AEPB INHALE 1 PUFF TWICE DAILY **RINSE MOUTH AFTER EACH USE** 180 each 2  . furosemide (LASIX) 20 MG tablet TAKE ONE TABLET BY MOUTH EVERY DAY 90 tablet 2  . levofloxacin (LEVAQUIN) 500 MG tablet Take 1 tablet (500 mg total) by mouth daily. 7 tablet 0  . lisinopril (PRINIVIL,ZESTRIL) 20 MG tablet TAKE ONE TABLET BY MOUTH EVERY DAY 90 tablet 2  . nitroGLYCERIN (NITROSTAT) 0.4 MG SL tablet Place 1 tablet (0.4 mg total) under the tongue every 5 (five) minutes as needed for chest pain. 30 tablet 3  . omeprazole (PRILOSEC) 40 MG capsule TAKE ONE CAPSULE BY MOUTH EVERY DAY 90 capsule 2  . PROAIR HFA 108 (90 Base) MCG/ACT inhaler Inhale 2 inhalations into the lungs every 6 (six) hours as needed for Wheezing. 8.5 g 2  . Tiotropium Bromide Monohydrate (SPIRIVA RESPIMAT) 2.5 MCG/ACT AERS INHALE TWO SPRAYS BY MOUTH ONCE DAILY 4 g 6  . predniSONE (DELTASONE) 10 MG tablet 6 tablets DAILY FOR 3 DAYS,  , then reduce by 1 tablet daily until gone (Patient not taking: Reported on 02/14/2017) 33 tablet 0   Current Facility-Administered Medications on File Prior to Visit  Medication Dose Route Frequency Provider Last Rate Last Dose  . ipratropium-albuterol (DUONEB) 0.5-2.5 (3) MG/3ML nebulizer solution 3 mL  3 mL Nebulization Q6H [name], MD   3 mL at 11/27/16 1523    There are no Patient Instructions on file for this visit. No Follow-up on file.   Sameria Morss A Moroni Nester, PA-C

## 2017-02-16 DIAGNOSIS — Z95810 Presence of automatic (implantable) cardiac defibrillator: Secondary | ICD-10-CM | POA: Diagnosis not present

## 2017-02-16 DIAGNOSIS — Z4502 Encounter for adjustment and management of automatic implantable cardiac defibrillator: Secondary | ICD-10-CM | POA: Diagnosis not present

## 2017-03-06 ENCOUNTER — Telehealth: Payer: Self-pay | Admitting: *Deleted

## 2017-03-06 DIAGNOSIS — E7800 Pure hypercholesterolemia, unspecified: Secondary | ICD-10-CM

## 2017-03-06 NOTE — Telephone Encounter (Signed)
[facility] requested a medication refill for Lipitor

## 2017-03-08 MED ORDER — ATORVASTATIN CALCIUM 40 MG PO TABS: 40.0000 mg | ORAL_TABLET | Freq: Every day | ORAL | 3 refills | Status: DC

## 2017-03-08 MED ORDER — ATORVASTATIN CALCIUM 40 MG PO TABS
40.0000 mg | ORAL_TABLET | Freq: Every day | ORAL | 3 refills | Status: DC
Start: 2017-03-08 — End: 2017-03-08

## 2017-03-08 NOTE — Telephone Encounter (Signed)
Refilled, thanks

## 2017-03-20 DIAGNOSIS — I5022 Chronic systolic (congestive) heart failure: Secondary | ICD-10-CM | POA: Diagnosis not present

## 2017-03-20 DIAGNOSIS — Z951 Presence of aortocoronary bypass graft: Secondary | ICD-10-CM | POA: Diagnosis not present

## 2017-03-20 DIAGNOSIS — E785 Hyperlipidemia, unspecified: Secondary | ICD-10-CM | POA: Diagnosis not present

## 2017-03-20 DIAGNOSIS — J449 Chronic obstructive pulmonary disease, unspecified: Secondary | ICD-10-CM | POA: Diagnosis not present

## 2017-03-20 DIAGNOSIS — Z95810 Presence of automatic (implantable) cardiac defibrillator: Secondary | ICD-10-CM | POA: Diagnosis not present

## 2017-03-20 DIAGNOSIS — I10 Essential (primary) hypertension: Secondary | ICD-10-CM | POA: Diagnosis not present

## 2017-03-20 DIAGNOSIS — I214 Non-ST elevation (NSTEMI) myocardial infarction: Secondary | ICD-10-CM | POA: Diagnosis not present

## 2017-03-20 DIAGNOSIS — I739 Peripheral vascular disease, unspecified: Secondary | ICD-10-CM | POA: Diagnosis not present

## 2017-03-29 DIAGNOSIS — I5022 Chronic systolic (congestive) heart failure: Secondary | ICD-10-CM | POA: Diagnosis not present

## 2017-06-05 DIAGNOSIS — Z95810 Presence of automatic (implantable) cardiac defibrillator: Secondary | ICD-10-CM | POA: Diagnosis not present

## 2017-06-05 DIAGNOSIS — Z4502 Encounter for adjustment and management of automatic implantable cardiac defibrillator: Secondary | ICD-10-CM | POA: Diagnosis not present

## 2017-06-08 ENCOUNTER — Other Ambulatory Visit: Payer: Self-pay | Admitting: Internal Medicine

## 2017-07-03 ENCOUNTER — Other Ambulatory Visit: Payer: Self-pay | Admitting: Internal Medicine

## 2017-08-01 NOTE — Telephone Encounter (Signed)
Error

## 2017-08-06 NOTE — Telephone Encounter (Signed)
Error

## 2017-08-14 ENCOUNTER — Ambulatory Visit (INDEPENDENT_AMBULATORY_CARE_PROVIDER_SITE_OTHER): Payer: Medicare Other | Admitting: Vascular Surgery

## 2017-08-14 ENCOUNTER — Encounter (INDEPENDENT_AMBULATORY_CARE_PROVIDER_SITE_OTHER): Payer: Medicare Other

## 2017-09-06 ENCOUNTER — Other Ambulatory Visit: Payer: Self-pay | Admitting: Internal Medicine

## 2017-09-12 ENCOUNTER — Ambulatory Visit (INDEPENDENT_AMBULATORY_CARE_PROVIDER_SITE_OTHER): Payer: Medicare Other

## 2017-09-12 ENCOUNTER — Ambulatory Visit (INDEPENDENT_AMBULATORY_CARE_PROVIDER_SITE_OTHER): Payer: Medicare Other | Admitting: Vascular Surgery

## 2017-09-12 ENCOUNTER — Encounter (INDEPENDENT_AMBULATORY_CARE_PROVIDER_SITE_OTHER): Payer: Self-pay | Admitting: Vascular Surgery

## 2017-09-12 VITALS — BP 178/109 | HR 80 | Resp 17 | Ht 64.0 in | Wt 174.0 lb

## 2017-09-12 DIAGNOSIS — Z720 Tobacco use: Secondary | ICD-10-CM | POA: Diagnosis not present

## 2017-09-12 DIAGNOSIS — I701 Atherosclerosis of renal artery: Secondary | ICD-10-CM

## 2017-09-12 DIAGNOSIS — I150 Renovascular hypertension: Secondary | ICD-10-CM

## 2017-09-12 DIAGNOSIS — E7800 Pure hypercholesterolemia, unspecified: Secondary | ICD-10-CM

## 2017-09-12 NOTE — Progress Notes (Signed)
Subjective:    Patient ID: Ashley [name], female    DOB: [birth year], 66 y.o.   MRN: [id number] Chief Complaint  Patient presents with  . Follow-up    6 month renal   Patient presents for 6 month renal artery stenosis follow-up. The patient presents without complaint.The patient states that her blood pressure has been controlled. The patient's last renal function was completed on 11/06/2016 this was normal. The patient underwent a bilateral renal artery duplex exam which was notable for a patent right renal artery stent, Doppler velocities to just greater than 60% stenosis of the left renal artery, patent bilateral renal veins, the bilateral kidney length measurements are within normal limits. When compared to the previous exam on 02/14/2017 there has been no significant change. The patient denies any fever, nausea or vomiting.   Review of Systems  Constitutional: Negative.   HENT: Negative.   Eyes: Negative.   Respiratory: Negative.   Cardiovascular: Negative.   Gastrointestinal: Negative.   Endocrine: Negative.   Genitourinary: Negative.   Musculoskeletal: Negative.   Skin: Negative.   Allergic/Immunologic: Negative.   Neurological: Negative.   Hematological: Negative.   Psychiatric/Behavioral: Negative.       Objective:   Physical Exam  Constitutional: She is oriented to person, place, and time. She appears well-developed and well-nourished. No distress.  HENT:  Head: Normocephalic and atraumatic.  Eyes: Conjunctivae are normal. Pupils are equal, round, and reactive to light.  Neck: Normal range of motion.  Cardiovascular: Normal rate, regular rhythm, normal heart sounds and intact distal pulses.  Pulses:      Radial pulses are 2+ on the right side, and 2+ on the left side.       Dorsalis pedis pulses are 2+ on the right side, and 2+ on the left side.       Posterior tibial pulses are 2+ on the right side, and 2+ on the left side.  Pulmonary/Chest: Effort normal and  breath sounds normal.  Musculoskeletal: Normal range of motion. She exhibits no edema.  Neurological: She is alert and oriented to person, place, and time.  Skin: Skin is warm and dry. She is not diaphoretic.  Psychiatric: She has a normal mood and affect. Her behavior is normal. Judgment and thought content normal.  Vitals reviewed.  BP (!) 178/109 (BP Location: Right Arm, Patient Position: Sitting)   Pulse 80   Resp 17   Ht 5\' 4"  (1.626 m)   Wt 174 lb (78.9 kg)   BMI 29.87 kg/m   Past Medical History:  Diagnosis Date  . AICD (automatic cardioverter/defibrillator) present    on right side  . CAD (coronary artery disease)    s/p CABG  . CHF (congestive heart failure) ([place])   . Chronic kidney disease    renal artery stenosis  . Colon cancer ([place])   . COPD (chronic obstructive pulmonary disease) ([place])   . Depression   . Dyspnea   . GERD (gastroesophageal reflux disease)   . Hx of colonic polyps   . Hyperlipidemia   . Hypertension   . Mitral valve disorder    s/p mitral valve repair wth CABG  . Myocardial infarction ([place])   . Peripheral vascular disease ([place])    Social History   Socioeconomic History  . Marital status: Widowed    Spouse name: Not on file  . Number of children: 0  . Years of education: Not on file  . Highest education level: Not on file  Social Needs  .  Financial resource strain: Not on file  . Food insecurity - worry: Not on file  . Food insecurity - inability: Not on file  . Transportation needs - medical: Not on file  . Transportation needs - non-medical: Not on file  Occupational History  . Not on file  Tobacco Use  . Smoking status: Current Every Day Smoker    Packs/day: 0.50    Types: Cigarettes  . Smokeless tobacco: Never Used  . Tobacco comment: 1/2-1 ppd  Substance and Sexual Activity  . Alcohol use: Yes    Alcohol/week: 1.2 oz    Types: 2 Shots of liquor per week    Comment: Occasional last drink thansgiving. vodka and oj  . Drug  use: No  . Sexual activity: Yes  Other Topics Concern  . Not on file  Social History Narrative  . Not on file   Past Surgical History:  Procedure Laterality Date  . arm surgery     fracture, has plates and screws  . CABG with mitral valve repair    . COLON SURGERY     colon cancer  . CORONARY ARTERY BYPASS GRAFT    . pace maker defib  2016  . TOTAL ABDOMINAL HYSTERECTOMY  1999   history of abnormal pap   Family History  Problem Relation Age of Onset  . Arthritis Mother   . Cancer Mother        uterus cancer  . Hyperlipidemia Mother   . Hypertension Mother   . Heart disease Mother   . Diabetes Mother   . Hyperlipidemia Father   . Hypertension Father   . Heart disease Father   . Diabetes Father   . Cancer Sister        ovary cancer  . Diabetes Maternal Grandmother   . Hypertension Maternal Grandmother   . Arthritis Maternal Grandmother   . Hypertension Maternal Grandfather   . Hypertension Paternal Grandmother   . Hypertension Paternal Grandfather   . Heart disease Paternal Grandfather   . Breast cancer Neg Hx    Allergies  Allergen Reactions  . Hydrocodone-Acetaminophen Nausea Only      Assessment & Plan:  Patient presents for 6 month renal artery stenosis follow-up. The patient presents without complaint.The patient states that her blood pressure has been controlled. The patient's last renal function was completed on 11/06/2016 this was normal. The patient underwent a bilateral renal artery duplex exam which was notable for a patent right renal artery stent, Doppler velocities to just greater than 60% stenosis of the left renal artery, patent bilateral renal veins, the bilateral kidney length measurements are within normal limits. When compared to the previous exam on 02/14/2017 there has been no significant change. The patient denies any fever, nausea or vomiting.  1. Renovascular hypertension - Stable Even though the patient's blood pressure was 178/109 she  states ththat is not normal and her blood pressure is controlled The patient has not taken her blood pressure medicine this morning in preparation for our duplex.  - VAS US RENAL ARTERY DUPLEX; Future  2. Renal artery stenosis (HCC) - Stable Patient presents today without complaint Physical exam is unremarkable Duplex with a patent right renal artery stent No indication for intervention at this time The patient can follow up in one year with a renal duplex to assess anatomy and patency  - VAS US RENAL ARTERY DUPLEX; Future  3. Hypercholesterolemia - Stable Encouraged good control as its slows the progression of atherosclerotic disease  4. Tobacco  abuse - Stable We had a discussion for approximately 10 minutes regarding the absolute need for smoking cessation due to the deleterious nature of tobacco on the vascular system. We discussed the tobacco use would diminish patency of any intervention, and likely significantly worsen progressio of disease. We discussed multiple agents for quitting including replacement therapy or medications to reduce cravings such as Chantix. The patient voices their understanding of the importance of smoking cessation.  Current Outpatient Medications on File Prior to Visit  Medication Sig Dispense Refill  . acetaminophen (TYLENOL) 500 MG tablet Maximum 6 tablets a day. (Patient taking differently: Take 500 mg by mouth every 4 (four) hours as needed for fever. Maximum 6 tablets a day.) 42 tablet 0  . ADVAIR DISKUS 250-50 MCG/DOSE AEPB INHALE 1 PUFF BY MOUTH TWICE DAILY - RINSE MOUTH AFTER EASCH USE 180 each 1  . ALPRAZolam (XANAX) 0.5 MG tablet TAKE ONE TABLET BY MOUTH AT BEDTIME AS NEEDED FOR ANXIETY 30 tablet 1  . amLODipine (NORVASC) 5 MG tablet TAKE ONE TABLET BY MOUTH EVERY DAY 90 tablet 1  . aspirin 81 MG tablet Take 81 mg by mouth daily.    [name] atorvastatin (LIPITOR) 40 MG tablet Take 1 tablet (40 mg total) by mouth daily. 90 tablet 3  . benzonatate  (TESSALON) 200 MG capsule Take 1 capsule (200 mg total) by mouth 3 (three) times daily as needed for cough. 60 capsule 1  . carvedilol (COREG) 6.25 MG tablet TAKE ONE TABLET BY MOUTH TWICE DAILY WITH MEAL 180 tablet 1  . clopidogrel (PLAVIX) 75 MG tablet TAKE ONE TABLET BY MOUTH EVERY DAY 90 tablet 1  . escitalopram (LEXAPRO) 10 MG tablet TAKE ONE TABLET BY MOUTH EVERY DAY 90 tablet 1  . Fluticasone-Salmeterol (ADVAIR DISKUS) 250-50 MCG/DOSE AEPB INHALE 1 PUFF TWICE DAILY **RINSE MOUTH AFTER EACH USE** 180 each 2  . furosemide (LASIX) 20 MG tablet TAKE ONE TABLET BY MOUTH EVERY DAY 90 tablet 1  . lisinopril (PRINIVIL,ZESTRIL) 20 MG tablet TAKE ONE TABLET BY MOUTH EVERY DAY 90 tablet 1  . nitroGLYCERIN (NITROSTAT) 0.4 MG SL tablet Place 1 tablet (0.4 mg total) under the tongue every 5 (five) minutes as needed for chest pain. 30 tablet 3  . omeprazole (PRILOSEC) 40 MG capsule TAKE ONE CAPSULE BY MOUTH EVERY DAY 90 capsule 1  . SPIRIVA RESPIMAT 2.5 MCG/ACT AERS Inhale 2 sprays (5 mcg) orally into the lungs once daily. 4 g 0  . VENTOLIN HFA 108 (90 Base) MCG/ACT inhaler INHALE 2 PUFFS INTO THE LUNGS EVERY 6 HOURS AS NEEDED FOR WHEEZING 18 g 0  . buPROPion (WELLBUTRIN SR) 100 MG 12 hr tablet TAKE ONE TABLET BY MOUTH TWICE DAILY (Patient not taking: Reported on 09/12/2017) 60 tablet 5  . levofloxacin (LEVAQUIN) 500 MG tablet Take 1 tablet (500 mg total) by mouth daily. (Patient not taking: Reported on 09/12/2017) 7 tablet 0  . predniSONE (DELTASONE) 10 MG tablet 6 tablets DAILY FOR 3 DAYS,  , then reduce by 1 tablet daily until gone (Patient not taking: Reported on 09/12/2017) 33 tablet 0   Current Facility-Administered Medications on File Prior to Visit  Medication Dose Route Frequency Provider Last Rate Last Dose  . ipratropium-albuterol (DUONEB) 0.5-2.5 (3) MG/3ML nebulizer solution 3 mL  3 mL Nebulization Q6H [name], MD   3 mL at 11/27/16 1523   There are no Patient Instructions on file for  this visit. No Follow-up on file.  Virgina Deakins A Camora Tremain, PA-C

## 2017-09-13 ENCOUNTER — Ambulatory Visit: Payer: Medicare Other

## 2017-09-14 ENCOUNTER — Ambulatory Visit: Payer: Medicare Other | Admitting: Internal Medicine

## 2018-01-08 ENCOUNTER — Other Ambulatory Visit: Payer: Self-pay | Admitting: Internal Medicine

## 2018-01-09 NOTE — Telephone Encounter (Signed)
Last OV: 11/26/2016 Next OV: not scheduled  Alprazolam    Refilled: 06/08/2017  Spiriva    Refilled: 07/05/2017

## 2018-01-29 ENCOUNTER — Other Ambulatory Visit: Payer: Self-pay | Admitting: Internal Medicine

## 2018-01-29 NOTE — Telephone Encounter (Signed)
Refilled: 06/08/2017 Last OV: 11/27/2016 Next OV: not scheduled

## 2018-04-08 ENCOUNTER — Other Ambulatory Visit: Payer: Self-pay | Admitting: Internal Medicine

## 2018-04-08 DIAGNOSIS — Z79899 Other long term (current) drug therapy: Secondary | ICD-10-CM

## 2018-04-08 DIAGNOSIS — R7301 Impaired fasting glucose: Secondary | ICD-10-CM

## 2018-04-08 DIAGNOSIS — I10 Essential (primary) hypertension: Secondary | ICD-10-CM

## 2018-04-08 DIAGNOSIS — E7800 Pure hypercholesterolemia, unspecified: Secondary | ICD-10-CM

## 2018-04-08 NOTE — Telephone Encounter (Signed)
Last OV: 11/27/2016 Next OV: not scheduled

## 2018-04-09 NOTE — Telephone Encounter (Signed)
First available non urgent appt is not until 05/15/2018. Is it okay to schedule pt then or would you like for her to come in sooner. If sooner there is several 11:30am, 4:00pm, 4:30pm and 5:00pm available for this month.

## 2018-04-09 NOTE — Telephone Encounter (Signed)
No,  Those are urgent slots.  But she must have fasting labs done before her appointment,  Refills sent to [facility]  and labs ordered

## 2018-04-09 NOTE — Telephone Encounter (Signed)
CALL PATIENT..  NO REFILLS UNTIL SHE IS SEEN.

## 2018-05-06 ENCOUNTER — Ambulatory Visit (INDEPENDENT_AMBULATORY_CARE_PROVIDER_SITE_OTHER): Payer: Medicare Other

## 2018-05-06 VITALS — BP 132/70 | HR 80 | Temp 98.5°F | Resp 16 | Ht 64.0 in | Wt 169.1 lb

## 2018-05-06 DIAGNOSIS — R7301 Impaired fasting glucose: Secondary | ICD-10-CM

## 2018-05-06 DIAGNOSIS — Z1159 Encounter for screening for other viral diseases: Secondary | ICD-10-CM | POA: Diagnosis not present

## 2018-05-06 DIAGNOSIS — Z79899 Other long term (current) drug therapy: Secondary | ICD-10-CM | POA: Diagnosis not present

## 2018-05-06 DIAGNOSIS — E7800 Pure hypercholesterolemia, unspecified: Secondary | ICD-10-CM

## 2018-05-06 DIAGNOSIS — Z0000 Encounter for general adult medical examination without abnormal findings: Secondary | ICD-10-CM

## 2018-05-06 LAB — CBC WITH DIFFERENTIAL/PLATELET
BASOS PCT: 2 % (ref 0.0–3.0)
Basophils Absolute: 0.1 10*3/uL (ref 0.0–0.1)
EOS PCT: 2 % (ref 0.0–5.0)
Eosinophils Absolute: 0.2 10*3/uL (ref 0.0–0.7)
HEMATOCRIT: 35.6 % — AB (ref 36.0–46.0)
HEMOGLOBIN: 12 g/dL (ref 12.0–15.0)
LYMPHS PCT: 21.6 % (ref 12.0–46.0)
Lymphs Abs: 1.6 10*3/uL (ref 0.7–4.0)
MCHC: 33.8 g/dL (ref 30.0–36.0)
MCV: 90.4 fl (ref 78.0–100.0)
MONOS PCT: 9.9 % (ref 3.0–12.0)
Monocytes Absolute: 0.7 10*3/uL (ref 0.1–1.0)
Neutro Abs: 4.8 10*3/uL (ref 1.4–7.7)
Neutrophils Relative %: 64.5 % (ref 43.0–77.0)
Platelets: 342 10*3/uL (ref 150.0–400.0)
RBC: 3.94 Mil/uL (ref 3.87–5.11)
RDW: 14.3 % (ref 11.5–15.5)
WBC: 7.4 10*3/uL (ref 4.0–10.5)

## 2018-05-06 LAB — LIPID PANEL
CHOLESTEROL: 222 mg/dL — AB (ref <200)
HDL: 41 mg/dL — AB (ref >50)
LDL Cholesterol (Calc): 139 mg/dL (calc) — ABNORMAL HIGH
Non-HDL Cholesterol (Calc): 181 mg/dL (calc) — ABNORMAL HIGH (ref <130)
Total CHOL/HDL Ratio: 5.4 (calc) — ABNORMAL HIGH (ref <5.0)
Triglycerides: 276 mg/dL — ABNORMAL HIGH (ref <150)

## 2018-05-06 LAB — COMPREHENSIVE METABOLIC PANEL
AG RATIO: 1.6 (calc) (ref 1.0–2.5)
ALKALINE PHOSPHATASE (APISO): 85 U/L (ref 33–130)
ALT: 20 U/L (ref 6–29)
AST: 18 U/L (ref 10–35)
Albumin: 4.3 g/dL (ref 3.6–5.1)
BILIRUBIN TOTAL: 0.5 mg/dL (ref 0.2–1.2)
BUN/Creatinine Ratio: 13 (calc) (ref 6–22)
BUN: 15 mg/dL (ref 7–25)
CALCIUM: 9.5 mg/dL (ref 8.6–10.4)
CHLORIDE: 100 mmol/L (ref 98–110)
CO2: 22 mmol/L (ref 20–32)
CREATININE: 1.15 mg/dL — AB (ref 0.50–0.99)
GLOBULIN: 2.7 g/dL (ref 1.9–3.7)
GLUCOSE: 160 mg/dL — AB (ref 65–99)
POTASSIUM: 4.2 mmol/L (ref 3.5–5.3)
Sodium: 137 mmol/L (ref 135–146)
Total Protein: 7 g/dL (ref 6.1–8.1)

## 2018-05-06 LAB — HEMOGLOBIN A1C: Hgb A1c MFr Bld: 6.2 % (ref 4.6–6.5)

## 2018-05-06 NOTE — Patient Instructions (Addendum)
  Ashley [name] , Thank you for taking time to come for your Medicare Wellness Visit. I appreciate your ongoing commitment to your health goals. Please review the following plan we discussed and let me know if I can assist you in the future.   Schedule and keep routine scheduled appointments.   These are the goals we discussed: Goals    . Healthy Lifestyle     Exercise, as tolerated Stay hydrated Healthy diet Quit smoking       This is a list of the screening recommended for you and due dates:  Health Maintenance  Topic Date Due  .  Hepatitis C: One time screening is recommended by Center for Disease Control  (CDC) for  adults born from [birth year] through 1963.   15-Mar-1951  . HIV Screening  04/26/1968  . Tetanus Vaccine  04/26/1972  . Pap Smear  04/26/1974  . DEXA scan (bone density measurement)  04/26/2018  . Pneumonia vaccines (1 of 2 - PCV13) 04/26/2018  . Flu Shot  06/06/2018  . Mammogram  12/14/2018  . Colon Cancer Screening  10/10/2019

## 2018-05-06 NOTE — Progress Notes (Addendum)
Subjective:   Ashley [name] is a 67 y.o. female who presents for Medicare Annual (Subsequent) preventive examination.  Review of Systems:  No ROS.  Medicare Wellness Visit. Additional risk factors are reflected in the social history.  Cardiac Risk Factors include: advanced age (>59men, >31 women);hypertension     Objective:     Vitals: BP 132/70 (BP Location: Left Arm, Patient Position: Sitting, Cuff Size: Normal)   Pulse 80   Temp 98.5 F (36.9 C) (Oral)   Resp 16   Ht 5\' 4"  (1.626 m)   Wt 169 lb 1.9 oz (76.7 kg)   SpO2 98%   BMI 29.03 kg/m   Body mass index is 29.03 kg/m.  Advanced Directives 05/06/2018 02/14/2017 11/20/2016 10/09/2016 09/13/2016 06/20/2016 08/08/2015  Does Patient Have a Medical Advance Directive? No No No No No No No  Would patient like information on creating a medical advance directive? No - Patient declined - - No - Patient declined Yes - Educational materials given No - patient declined information No - patient declined information    Tobacco Social History   Tobacco Use  Smoking Status Current Every Day Smoker  . Packs/day: 0.50  . Types: Cigarettes  Smokeless Tobacco Never Used  Tobacco Comment   1/2-1 ppd     Ready to quit: Not Answered Counseling given: Not Answered Comment: 1/2-1 ppd   Clinical Intake:  Pre-visit preparation completed: Yes  Pain : No/denies pain     Nutritional Status: BMI 25 -29 Overweight Diabetes: No  How often do you need to have someone help you when you read instructions, pamphlets, or other written materials from your doctor or pharmacy?: 1 - Never  Interpreter Needed?: No     Past Medical History:  Diagnosis Date  . AICD (automatic cardioverter/defibrillator) present    on right side  . CAD (coronary artery disease)    s/p CABG  . CHF (congestive heart failure) ([employer])   . Chronic kidney disease    renal artery stenosis  . Colon cancer ([place])   . COPD (chronic obstructive pulmonary disease)  ([place])   . Depression   . Dyspnea   . GERD (gastroesophageal reflux disease)   . Hx of colonic polyps   . Hyperlipidemia   . Hypertension   . Mitral valve disorder    s/p mitral valve repair wth CABG  . Myocardial infarction ([place])   . Peripheral vascular disease [facility])    Past Surgical History:  Procedure Laterality Date  . arm surgery     fracture, has plates and screws  . CABG with mitral valve repair    . COLON SURGERY     colon cancer  . COLONOSCOPY WITH PROPOFOL N/A 10/09/2016   Procedure: COLONOSCOPY WITH PROPOFOL;  Surgeon: Jonathon Bellows, MD;  Location: [facility] ENDOSCOPY;  Service: Endoscopy;  Laterality: N/A;  . CORONARY ARTERY BYPASS GRAFT    . pace maker defib  2016  . PERIPHERAL VASCULAR CATHETERIZATION N/A 10/23/2016   Procedure: Renal Angiography;  Surgeon: Algernon Huxley, MD;  Location: [facility];  Service: Cardiovascular;  Laterality: N/A;  . TOTAL ABDOMINAL HYSTERECTOMY  1999   history of abnormal pap   Family History  Problem Relation Age of Onset  . Arthritis Mother   . Cancer Mother        uterus cancer  . Hyperlipidemia Mother   . Hypertension Mother   . Heart disease Mother   . Diabetes Mother   . Hyperlipidemia Father   .  Hypertension Father   . Heart disease Father   . Diabetes Father   . Cancer Sister        ovary cancer  . Diabetes Maternal Grandmother   . Hypertension Maternal Grandmother   . Arthritis Maternal Grandmother   . Hypertension Maternal Grandfather   . Hypertension Paternal Grandmother   . Hypertension Paternal Grandfather   . Heart disease Paternal Grandfather   . Breast cancer Neg Hx    Social History   Socioeconomic History  . Marital status: Widowed    Spouse name: Not on file  . Number of children: 0  . Years of education: Not on file  . Highest education level: Not on file  Occupational History  . Not on file  Social Needs  . Financial resource strain: Not hard at all  . Food insecurity:    Worry: Never true      Inability: Never true  . Transportation needs:    Medical: No    Non-medical: No  Tobacco Use  . Smoking status: Current Every Day Smoker    Packs/day: 0.50    Types: Cigarettes  . Smokeless tobacco: Never Used  . Tobacco comment: 1/2-1 ppd  Substance and Sexual Activity  . Alcohol use: Yes    Alcohol/week: 1.2 oz    Types: 2 Shots of liquor per week    Comment: Occasional last drink thansgiving. vodka and oj  . Drug use: No  . Sexual activity: Yes  Lifestyle  . Physical activity:    Days per week: 0 days    Minutes per session: Not on file  . Stress: Not at all  Relationships  . Social connections:    Talks on phone: Not on file    Gets together: Not on file    Attends religious service: Not on file    Active member of club or organization: Not on file    Attends meetings of clubs or organizations: Not on file    Relationship status: Not on file  Other Topics Concern  . Not on file  Social History Narrative  . Not on file    Outpatient Encounter Medications as of 05/06/2018  Medication Sig  . acetaminophen (TYLENOL) 500 MG tablet Maximum 6 tablets a day. (Patient taking differently: Take 500 mg by mouth every 4 (four) hours as needed for fever. Maximum 6 tablets a day.)  . ADVAIR DISKUS 250-50 MCG/DOSE AEPB INHALE 1 PUFF BY MOUTH TWICE A DAY. RINSE MOUTH AFTER EACH USE  . amLODipine (NORVASC) 5 MG tablet TAKE ONE TABLET BY MOUTH EVERY DAY  . aspirin 81 MG tablet Take 81 mg by mouth daily.  . carvedilol (COREG) 6.25 MG tablet TAKE ONE TABLET BY MOUTH TWICE DAILY WITH MEAL  . clopidogrel (PLAVIX) 75 MG tablet TAKE ONE TABLET BY MOUTH EVERY DAY  . escitalopram (LEXAPRO) 10 MG tablet TAKE ONE TABLET BY MOUTH EVERY DAY  . furosemide (LASIX) 20 MG tablet TAKE ONE TABLET BY MOUTH EVERY DAY  . lisinopril (PRINIVIL,ZESTRIL) 20 MG tablet TAKE ONE TABLET BY MOUTH EVERY DAY  . omeprazole (PRILOSEC) 40 MG capsule TAKE ONE CAPSULE BY MOUTH EVERY DAY  . SPIRIVA RESPIMAT 2.5  MCG/ACT AERS ACTIVATE AND INHALE TWO SPRAYS ORALLY INTO THE LUNGS ONCE DAILY.  . [DISCONTINUED] benzonatate (TESSALON) 200 MG capsule Take 1 capsule (200 mg total) by mouth 3 (three) times daily as needed for cough.  . [DISCONTINUED] buPROPion (WELLBUTRIN SR) 100 MG 12 hr tablet TAKE ONE TABLET BY MOUTH TWICE  DAILY  . [DISCONTINUED] levofloxacin (LEVAQUIN) 500 MG tablet Take 1 tablet (500 mg total) by mouth daily.  . [DISCONTINUED] predniSONE (DELTASONE) 10 MG tablet 6 tablets DAILY FOR 3 DAYS,  , then reduce by 1 tablet daily until gone  . ALPRAZolam (XANAX) 0.5 MG tablet TAKE ONE TABLET BY MOUTH AT BEDTIME AS NEEDED FOR ANXIETY (Patient not taking: Reported on 05/06/2018)  . atorvastatin (LIPITOR) 40 MG tablet Take 1 tablet (40 mg total) by mouth daily. (Patient not taking: Reported on 05/06/2018)  . nitroGLYCERIN (NITROSTAT) 0.4 MG SL tablet Place 1 tablet (0.4 mg total) under the tongue every 5 (five) minutes as needed for chest pain. (Patient not taking: Reported on 05/06/2018)   No facility-administered encounter medications on file as of 05/06/2018.     Activities of Daily Living In your present state of health, do you have any difficulty performing the following activities: 05/06/2018  Hearing? N  Vision? N  Difficulty concentrating or making decisions? N  Walking or climbing stairs? Y  Comment COPD  Dressing or bathing? N  Doing errands, shopping? N  Preparing Food and eating ? N  Using the Toilet? N  In the past six months, have you accidently leaked urine? N  Do you have problems with loss of bowel control? N  Managing your Medications? N  Managing your Finances? N  Housekeeping or managing your Housekeeping? N  Some recent data might be hidden    Patient Care Team: [name], MD as PCP - General (Internal Medicine)    Assessment:   This is a routine wellness examination for Fe.  The goal of the wellness visit is to assist the patient how to close the gaps in care and  create a preventative care plan for the patient.   The roster of all physicians providing medical care to patient is listed in the Snapshot section of the chart.  Taking calcium VIT D as appropriate/Osteoporosis risk reviewed.    Safety issues reviewed; Smoke and carbon monoxide detectors in the home. No firearms in the home. Wears seatbelts when driving or riding with others. No violence in the home.  They do not have excessive sun exposure.  Discussed the need for sun protection: hats, long sleeves and the use of sunscreen if there is significant sun exposure.  Patient is alert, normal appearance, oriented to person/place/and time. Correctly identified the president of the [place] and recalls of 3/3 words.Performs simple calculations and can read correct time from watch face. Displays appropriate judgement.  No new identified risk were noted.  No failures at ADL's or IADL's.    BMI- discussed the importance of a healthy diet, water intake and the benefits of aerobic exercise. Educational material provided.   24 hour diet recall: Regular diet  Eye- Visual acuity not assessed per patient preference since they have regular follow up with the ophthalmologist.  Wears corrective lenses.  Sleep patterns- Sleeps through the night with medication.    Hep C screening consent given.    Labs completed today.   TDAP vaccine deferred per patient preference.  Follow up with insurance.  Educational material provided.  Patient Concerns:Medication refill. Due to her having no visit with her pcp for 17 months, I encouraged her to schedule and keep routine maintenance appointments; medication refill to be addressed at her next scheduled appointment.   Exercise Activities and Dietary recommendations Current Exercise Habits: The patient does not participate in regular exercise at present  Goals    . Healthy  Lifestyle     Exercise, as tolerated Stay hydrated Healthy diet Quit smoking        Fall Risk Fall Risk  05/06/2018 09/13/2016  Falls in the past year? No No   Depression Screen PHQ 2/9 Scores 05/06/2018 09/18/2016 09/13/2016  PHQ - 2 Score 0 6 4  PHQ- 9 Score - 16 8     Cognitive Function MMSE - Mini Mental State Exam 05/06/2018 09/13/2016  Orientation to time 5 5  Orientation to Place 5 5  Registration 3 3  Attention/ Calculation 5 5  Recall 3 3  Language- name 2 objects 2 2  Language- repeat 1 1  Language- follow 3 step command 3 3  Language- read & follow direction 1 1  Write a sentence 1 1  Copy design 1 1  Total score 30 30     6CIT Screen 09/13/2016  What Year? 0 points  What month? 0 points  What time? 0 points  Count back from 20 0 points  Months in reverse 0 points    Immunization History  Administered Date(s) Administered  . Influenza Split 08/20/2014  . Influenza,inj,Quad PF,6+ Mos 06/28/2016  . Pneumococcal Polysaccharide-23 08/20/2014   Screening Tests Health Maintenance  Topic Date Due  . Hepatitis C Screening  1951/05/26  . HIV Screening  04/26/1968  . TETANUS/TDAP  04/26/1972  . PAP SMEAR  04/26/1974  . DEXA SCAN  04/26/2018  . PNA vac Low Risk Adult (1 of 2 - PCV13) 04/26/2018  . INFLUENZA VACCINE  06/06/2018  . MAMMOGRAM  12/14/2018  . COLONOSCOPY  10/10/2019      Plan:   End of life planning; Advanced aging; Advanced directives discussed.  No HCPOA/Living Will.  Additional information declined at this time.  I have personally reviewed and noted the following in the patient's chart:   . Medical and social history . Use of alcohol, tobacco or illicit drugs  . Current medications and supplements . Functional ability and status . Nutritional status . Physical activity . Advanced directives . List of other physicians . Hospitalizations, surgeries, and ER visits in previous 12 months . Vitals . Screenings to include cognitive, depression, and falls . Referrals and appointments  In addition, I have reviewed and  discussed with patient certain preventive protocols, quality metrics, and best practice recommendations. A written personalized care plan for preventive services as well as general preventive health recommendations were provided to patient.     OBrien-Blaney, Alanah Sakuma L, LPN  07/09/7341   I have reviewed the above information and agree with above.   Deborra Medina, MD

## 2018-05-07 LAB — HEPATITIS C ANTIBODY
HEP C AB: NONREACTIVE
SIGNAL TO CUT-OFF: 0.04 (ref <1.00)

## 2018-05-08 ENCOUNTER — Ambulatory Visit (INDEPENDENT_AMBULATORY_CARE_PROVIDER_SITE_OTHER): Payer: Medicare Other | Admitting: Internal Medicine

## 2018-05-08 ENCOUNTER — Encounter: Payer: Self-pay | Admitting: Internal Medicine

## 2018-05-08 VITALS — BP 94/64 | HR 80 | Temp 98.0°F | Resp 15 | Ht 64.0 in | Wt 170.4 lb

## 2018-05-08 DIAGNOSIS — R7303 Prediabetes: Secondary | ICD-10-CM | POA: Diagnosis not present

## 2018-05-08 DIAGNOSIS — Z720 Tobacco use: Secondary | ICD-10-CM

## 2018-05-08 DIAGNOSIS — D126 Benign neoplasm of colon, unspecified: Secondary | ICD-10-CM

## 2018-05-08 DIAGNOSIS — I952 Hypotension due to drugs: Secondary | ICD-10-CM

## 2018-05-08 DIAGNOSIS — F325 Major depressive disorder, single episode, in full remission: Secondary | ICD-10-CM | POA: Diagnosis not present

## 2018-05-08 DIAGNOSIS — E7800 Pure hypercholesterolemia, unspecified: Secondary | ICD-10-CM | POA: Diagnosis not present

## 2018-05-08 DIAGNOSIS — Z1231 Encounter for screening mammogram for malignant neoplasm of breast: Secondary | ICD-10-CM | POA: Diagnosis not present

## 2018-05-08 DIAGNOSIS — Z1239 Encounter for other screening for malignant neoplasm of breast: Secondary | ICD-10-CM

## 2018-05-08 MED ORDER — ALBUTEROL SULFATE HFA 108 (90 BASE) MCG/ACT IN AERS: 2.0000 | INHALATION_SPRAY | Freq: Four times a day (QID) | RESPIRATORY_TRACT | 2 refills | Status: DC | PRN

## 2018-05-08 MED ORDER — ALPRAZOLAM 0.5 MG PO TABS: ORAL_TABLET | ORAL | 5 refills | Status: DC

## 2018-05-08 MED ORDER — ATORVASTATIN CALCIUM 40 MG PO TABS: 40.0000 mg | ORAL_TABLET | Freq: Every day | ORAL | 1 refills | Status: DC

## 2018-05-08 NOTE — Patient Instructions (Addendum)
Stop the amlodipine for now bc your bp is too low  Recheck your pressure in a week and resume the amlodpine IF:   Your  BP IS 140/80 OR HIGHER   IF your BP IS between 130 and 140,   RESUME 1/2 TABLET amlodipine DAILY    IF  Your  BP IS BELOW 130,  Do not resume amlodipine    CONTINUE LISINOPRIL AND carvedilol    Your  fasting glucose has never been  diagnostic of diabetes; but your A1c  OF 6.2  SUGGESTS that  you are at risk for developing type 2 Diabetes.     Preventing Type 2 Diabetes Mellitus Type 2 diabetes (type 2 diabetes mellitus) is a long-term (chronic) disease that affects blood sugar (glucose) levels. Normally, a hormone called insulin allows glucose to enter cells in the body. The cells use glucose for energy. In type 2 diabetes, one or both of these problems may be present:  The body does not make enough insulin.  The body does not respond properly to insulin that it makes (insulin resistance).  Insulin resistance or lack of insulin causes excess glucose to build up in the blood instead of going into cells. As a result, high blood glucose (hyperglycemia) develops, which can cause many complications. Being overweight or obese and having an inactive (sedentary) lifestyle can increase your risk for diabetes. Type 2 diabetes can be delayed or prevented by making certain nutrition and lifestyle changes. What nutrition changes can be made?  Eat healthy meals and snacks regularly. Keep a healthy snack with you for when you get hungry between meals, such as fruit or a handful of nuts.  Eat lean meats and proteins that are low in saturated fats, such as chicken, fish, egg whites, and beans. Avoid processed meats.  Eat plenty of fruits and vegetables and plenty of grains that have not been processed (whole grains). It is recommended that you eat: ? 1?2 cups of fruit every day. ? 2?3 cups of vegetables every day. ? 6?8 oz of whole grains every day, such as oats, whole wheat,  bulgur, brown rice, quinoa, and millet.  Eat low-fat dairy products, such as milk, yogurt, and cheese.  Eat foods that contain healthy fats, such as nuts, avocado, olive oil, and canola oil.  Drink water throughout the day. Avoid drinks that contain added sugar, such as soda or sweet tea.  Follow instructions from your health care provider about specific eating or drinking restrictions.  Control how much food you eat at a time (portion size). ? Check food labels to find out the serving sizes of foods. ? Use a kitchen scale to weigh amounts of foods.  Saute or steam food instead of frying it. Cook with water or broth instead of oils or butter.  Limit your intake of: ? Salt (sodium). Have no more than 1 tsp (2,400 mg) of sodium a day. If you have heart disease or high blood pressure, have less than ? tsp (1,500 mg) of sodium a day. ? Saturated fat. This is fat that is solid at room temperature, such as butter or fat on meat. What lifestyle changes can be made?  Activity  Do moderate-intensity physical activity for at least 30 minutes on at least 5 days of the week, or as much as told by your health care provider.  Ask your health care provider what activities are safe for you. A mix of physical activities may be best, such as walking, swimming, cycling, and strength  training.  Try to add physical activity into your day. For example: ? Park in spots that are farther away than usual, so that you walk more. For example, park in a far corner of the parking lot when you go to the office or the grocery store. ? Take a walk during your lunch break. ? Use stairs instead of elevators or escalators. Weight Loss  Lose weight as directed. Your health care provider can determine how much weight loss is best for you and can help you lose weight safely.  If you are overweight or obese, you may be instructed to lose at least 5?7 % of your body weight. Alcohol and Tobacco   Limit alcohol intake  to no more than 1 drink a day for nonpregnant women and 2 drinks a day for men. One drink equals 12 oz of beer, 5 oz of wine, or 1 oz of hard liquor.  Do not use any tobacco products, such as cigarettes, chewing tobacco, and e-cigarettes. If you need help quitting, ask your health care provider. Work With [place] Provider  Have your blood glucose tested regularly, as told by your health care provider.  Discuss your risk factors and how you can reduce your risk for diabetes.  Get screening tests as told by your health care provider. You may have screening tests regularly, especially if you have certain risk factors for type 2 diabetes.  Make an appointment with a diet and nutrition specialist (registered dietitian). A registered dietitian can help you make a healthy eating plan and can help you understand portion sizes and food labels. Why are these changes important?  It is possible to prevent or delay type 2 diabetes and related health problems by making lifestyle and nutrition changes.  It can be difficult to recognize signs of type 2 diabetes. The best way to avoid possible damage to your body is to take actions to prevent the disease before you develop symptoms. What can happen if changes are not made?  Your blood glucose levels may keep increasing. Having high blood glucose for a long time is dangerous. Too much glucose in your blood can damage your blood vessels, heart, kidneys, nerves, and eyes.  You may develop prediabetes or type 2 diabetes. Type 2 diabetes can lead to many chronic health problems and complications, such as: ? Heart disease. ? Stroke. ? Blindness. ? Kidney disease. ? Depression. ? Poor circulation in the feet and legs, which could lead to surgical removal (amputation) in severe cases. Where to find support:  Ask your health care provider to recommend a registered dietitian, diabetes educator, or weight loss program.  Look for local or online weight  loss groups.  Join a gym, fitness club, or outdoor activity group, such as a walking club. Where to find more information: To learn more about diabetes and diabetes prevention, visit:  American Diabetes Association (ADA): www.diabetes.[employer] of Diabetes and Digestive and Kidney Diseases: FindSpin.nl  To learn more about healthy eating, visit:  The U.S. Department of Agriculture Scientist, research (physical sciences)), Choose My Plate: http://wiley-williams.com/  Office of Disease Prevention and Health Promotion (ODPHP), Dietary Guidelines: SurferLive.at  Summary  You can reduce your risk for type 2 diabetes by increasing your physical activity, eating healthy foods, and losing weight as directed.  Talk with your health care provider about your risk for type 2 diabetes. Ask about any blood tests or screening tests that you need to have. This information is not intended to replace advice given  to you by your health care provider. Make sure you discuss any questions you have with your health care provider. Document Released: 02/14/2016 Document Revised: 03/30/2016 Document Reviewed: 12/14/2015 Elsevier Interactive Patient Education  [employer].

## 2018-05-08 NOTE — Progress Notes (Signed)
Subjective:  Patient ID: Ashley [name], female    DOB: 01/27/1951  Age: 67 y.o. MRN: [id number]  CC: The primary encounter diagnosis was Breast cancer screening. Diagnoses of Hypercholesterolemia, Hypotension due to drugs, Tobacco abuse, Tubular adenoma of colon, Major depressive disorder with single episode, in remission ([place]), and Prediabetes were also pertinent to this visit.  HPI GARLAND SMOUSE presents for evaluation of fatigue and follow up on COPD, CAD, PAD with renal artery stenosis patient was last seen January 2018( 18 months go) and refills on her medications were denied until she was seen . [name]  Patient has advanced COPD  Multivessel CAD , PAD , renal artery stenosis s/p right RA stent , >60% stenosis of left RA .  still smoking 1/2 to 1 pack daily . Seen in October 2018 by vascular,  counselled strongly to quit smoking  Fatigue:  Does not sleep without xanax.  Averages 5 to 6 hours with the alprazolam , uses 1/2 at a time    Has been feeling somewhat dizzy and off balance at times without true vertigo. Does not check BP at home. Denies any recent history of chest pain . Has dyspnea with any time spent outside. Using  inhalers.   LAST MAMMOGRAM FEB 2018 NORMAL  LAST SCOPE Was done in 2017 multiple TUBULAR ADENOMAS,  History of colon CA     Outpatient Medications Prior to Visit  Medication Sig Dispense Refill  . acetaminophen (TYLENOL) 500 MG tablet Maximum 6 tablets a day. (Patient taking differently: Take 500 mg by mouth every 4 (four) hours as needed for fever. Maximum 6 tablets a day.) 42 tablet 0  . ADVAIR DISKUS 250-50 MCG/DOSE AEPB INHALE 1 PUFF BY MOUTH TWICE A DAY. RINSE MOUTH AFTER EACH USE 180 each 1  . aspirin 81 MG tablet Take 81 mg by mouth daily.    . carvedilol (COREG) 6.25 MG tablet TAKE ONE TABLET BY MOUTH TWICE DAILY WITH MEAL 180 tablet 1  . clopidogrel (PLAVIX) 75 MG tablet TAKE ONE TABLET BY MOUTH EVERY DAY 90 tablet 1  . escitalopram (LEXAPRO) 10 MG  tablet TAKE ONE TABLET BY MOUTH EVERY DAY 90 tablet 1  . furosemide (LASIX) 20 MG tablet TAKE ONE TABLET BY MOUTH EVERY DAY 90 tablet 1  . lisinopril (PRINIVIL,ZESTRIL) 20 MG tablet TAKE ONE TABLET BY MOUTH EVERY DAY 90 tablet 1  . nitroGLYCERIN (NITROSTAT) 0.4 MG SL tablet Place 1 tablet (0.4 mg total) under the tongue every 5 (five) minutes as needed for chest pain. 30 tablet 3  . omeprazole (PRILOSEC) 40 MG capsule TAKE ONE CAPSULE BY MOUTH EVERY DAY 90 capsule 1  . SPIRIVA RESPIMAT 2.5 MCG/ACT AERS ACTIVATE AND INHALE TWO SPRAYS ORALLY INTO THE LUNGS ONCE DAILY. 4 g 0  . amLODipine (NORVASC) 5 MG tablet TAKE ONE TABLET BY MOUTH EVERY DAY 90 tablet 1  . atorvastatin (LIPITOR) 40 MG tablet Take 1 tablet (40 mg total) by mouth daily. 90 tablet 3  . ALPRAZolam (XANAX) 0.5 MG tablet TAKE ONE TABLET BY MOUTH AT BEDTIME AS NEEDED FOR ANXIETY (Patient not taking: Reported on 05/06/2018) 30 tablet 1   No facility-administered medications prior to visit.     Review of Systems;  Patient denies headache, fevers, malaise, unintentional weight loss, skin rash, eye pain, sinus congestion and sinus pain, sore throat, dysphagia,  hemoptysis , cough, dyspnea, wheezing, chest pain, palpitations, orthopnea, edema, abdominal pain, nausea, melena, diarrhea, constipation, flank pain, dysuria, hematuria, urinary  Frequency, nocturia,  numbness, tingling, seizures,  Focal weakness, Loss of consciousness,  Tremor, insomnia, depression, anxiety, and suicidal ideation.      Objective:  BP 94/64 (BP Location: Left Arm, Patient Position: Sitting, Cuff Size: Normal)   Pulse 80   Temp 98 F (36.7 C) (Oral)   Resp 15   Ht 5\' 4"  (1.626 m)   Wt 170 lb 6.4 oz (77.3 kg)   SpO2 96%   BMI 29.25 kg/m   BP Readings from Last 3 Encounters:  05/08/18 94/64  05/06/18 132/70  09/12/17 (!) 178/109    Wt Readings from Last 3 Encounters:  05/08/18 170 lb 6.4 oz (77.3 kg)  05/06/18 169 lb 1.9 oz (76.7 kg)  09/12/17 174 lb  (78.9 kg)    General appearance: alert, cooperative and appears stated age Ears: normal TM's and external ear canals both ears Throat: lips, mucosa, and tongue normal; teeth and gums normal Neck: no adenopathy, no carotid bruit, supple, symmetrical, trachea midline and thyroid not enlarged, symmetric, no tenderness/mass/nodules Back: symmetric, no curvature. ROM normal. No CVA tenderness. Lungs: clear to auscultation bilaterally Heart: regular rate and rhythm, S1, S2 normal, no murmur, click, rub or gallop Abdomen: soft, non-tender; bowel sounds normal; no masses,  no organomegaly Pulses: 2+ and symmetric Skin: Skin color, texture, turgor normal. No rashes or lesions Lymph nodes: Cervical, supraclavicular, and axillary nodes normal.  Lab Results  Component Value Date   HGBA1C 6.2 05/06/2018   HGBA1C 5.7 06/21/2016    Lab Results  Component Value Date   CREATININE 1.15 (H) 05/06/2018   CREATININE 0.99 11/27/2016   CREATININE 1.00 10/10/2016    Lab Results  Component Value Date   WBC 7.4 05/06/2018   HGB 12.0 05/06/2018   HCT 35.6 (L) 05/06/2018   PLT 342.0 05/06/2018   GLUCOSE 160 (H) 05/06/2018   CHOL 222 (H) 05/06/2018   TRIG 276 (H) 05/06/2018   HDL 41 (L) 05/06/2018   LDLDIRECT 155.0 06/06/2016   LDLCALC 139 (H) 05/06/2018   ALT 20 05/06/2018   AST 18 05/06/2018   NA 137 05/06/2018   K 4.2 05/06/2018   CL 100 05/06/2018   CREATININE 1.15 (H) 05/06/2018   BUN 15 05/06/2018   CO2 22 05/06/2018   TSH 2.301 06/21/2016   INR 1.1 08/19/2014   HGBA1C 6.2 05/06/2018     Assessment & Plan:   Problem List Items Addressed This Visit    Hypercholesterolemia   Relevant Medications   atorvastatin (LIPITOR) 40 MG tablet   Tubular adenoma of colon    Multiple polyps found on 2017 colonoscopy .  3 yr follow up advised      Major depressive disorder in remission [facility])    Screening repeated.  She is not suicidal.  contineu Lexapro  And alprazolam qhs prn insmonia. The  risks and benefits of benzodiazepine use were discussed with patient today including excessive sedation leading to respiratory depression,  impaired thinking/driving, and addiction.  Patient was advised to avoid concurrent use with alcohol, to use medication only as needed and not to share with others  .       Relevant Medications   ALPRAZolam (XANAX) 0.5 MG tablet   Tobacco abuse    Risks of continued tobacco use were discussed. She is not contemplating quitting at the present time due to multiple home stressors .      Hypotension    She is taking multiple anti hypertensives and has a history of RAS s/p right sided stent .  Will  suspend amlodipine  And resume only for SBP > 140  Lab Results  Component Value Date   CREATININE 1.15 (H) 05/06/2018   Lab Results  Component Value Date   NA 137 05/06/2018   K 4.2 05/06/2018   CL 100 05/06/2018   CO2 22 05/06/2018   Lab Results  Component Value Date   WBC 7.4 05/06/2018   HGB 12.0 05/06/2018   HCT 35.6 (L) 05/06/2018   MCV 90.4 05/06/2018   PLT 342.0 05/06/2018         Relevant Medications   atorvastatin (LIPITOR) 40 MG tablet   Prediabetes    Her random glucose is again elevated but not diagnostic of diabetes .  I recommend she follow a low glycemic index diet and particpate regularly in an aerobic  exercise activity.  We should check an A1c in 6 months.        Other Visit Diagnoses    Breast cancer screening    -  Primary   Relevant Orders   MM 3D SCREEN BREAST BILATERAL      I have discontinued Rakeb A. Foglio "Debbie"'s amLODipine. I am also having her maintain her aspirin, acetaminophen, nitroGLYCERIN, SPIRIVA RESPIMAT, ADVAIR DISKUS, carvedilol, omeprazole, escitalopram, clopidogrel, furosemide, lisinopril, atorvastatin, ALPRAZolam, and albuterol.   A total of 40 minutes was spent with patient more than half of which was spent in counseling patient on the above mentioned issues , reviewing and explaining recent  labs and imaging studies done, and coordination of care.  Meds ordered this encounter  Medications  . atorvastatin (LIPITOR) 40 MG tablet    Sig: Take 1 tablet (40 mg total) by mouth daily.    Dispense:  90 tablet    Refill:  1  . ALPRAZolam (XANAX) 0.5 MG tablet    Sig: TAKE ONE TABLET BY MOUTH AT BEDTIME AS NEEDED FOR ANXIETY    Dispense:  30 tablet    Refill:  5  . albuterol (PROVENTIL HFA;VENTOLIN HFA) 108 (90 Base) MCG/ACT inhaler    Sig: Inhale 2 puffs into the lungs every 6 (six) hours as needed for wheezing.    Dispense:  18 g    Refill:  2    Medications Discontinued During This Encounter  Medication Reason  . atorvastatin (LIPITOR) 40 MG tablet Reorder  . ALPRAZolam (XANAX) 0.5 MG tablet   . amLODipine (NORVASC) 5 MG tablet     Follow-up: Return in about 6 months (around 11/08/2018) for hypertension, prediabetes .   [name], MD

## 2018-05-11 DIAGNOSIS — I959 Hypotension, unspecified: Secondary | ICD-10-CM | POA: Insufficient documentation

## 2018-05-11 DIAGNOSIS — R7303 Prediabetes: Secondary | ICD-10-CM | POA: Insufficient documentation

## 2018-05-11 NOTE — Assessment & Plan Note (Signed)
Multiple polyps found on 2017 colonoscopy .  3 yr follow up advised

## 2018-05-11 NOTE — Assessment & Plan Note (Signed)
Risks of continued tobacco use were discussed. She is not contemplating quitting at the present time due to multiple home stressors .

## 2018-05-11 NOTE — Assessment & Plan Note (Signed)
Screening repeated.  She is not suicidal.  contineu Lexapro  And alprazolam qhs prn insmonia. The risks and benefits of benzodiazepine use were discussed with patient today including excessive sedation leading to respiratory depression,  impaired thinking/driving, and addiction.  Patient was advised to avoid concurrent use with alcohol, to use medication only as needed and not to share with others  .

## 2018-05-11 NOTE — Assessment & Plan Note (Addendum)
She is taking multiple anti hypertensives and has a history of RAS s/p right sided stent .  Will  suspend amlodipine  And resume only for SBP > 140  Lab Results  Component Value Date   CREATININE 1.15 (H) 05/06/2018   Lab Results  Component Value Date   NA 137 05/06/2018   K 4.2 05/06/2018   CL 100 05/06/2018   CO2 22 05/06/2018   Lab Results  Component Value Date   WBC 7.4 05/06/2018   HGB 12.0 05/06/2018   HCT 35.6 (L) 05/06/2018   MCV 90.4 05/06/2018   PLT 342.0 05/06/2018

## 2018-05-11 NOTE — Assessment & Plan Note (Signed)
Her random glucose is again elevated but not diagnostic of diabetes .  I recommend she follow a low glycemic index diet and particpate regularly in an aerobic  exercise activity.  We should check an A1c in 6 months.

## 2018-05-28 ENCOUNTER — Emergency Department
Admission: EM | Admit: 2018-05-28 | Discharge: 2018-05-29 | Disposition: A | Discharge status: Home / Self Care | Payer: Medicare Other | Attending: Emergency Medicine | Admitting: Emergency Medicine

## 2018-05-28 DIAGNOSIS — F17210 Nicotine dependence, cigarettes, uncomplicated: Secondary | ICD-10-CM | POA: Insufficient documentation

## 2018-05-28 DIAGNOSIS — I509 Heart failure, unspecified: Secondary | ICD-10-CM | POA: Diagnosis not present

## 2018-05-28 DIAGNOSIS — I2510 Atherosclerotic heart disease of native coronary artery without angina pectoris: Secondary | ICD-10-CM | POA: Insufficient documentation

## 2018-05-28 DIAGNOSIS — J449 Chronic obstructive pulmonary disease, unspecified: Secondary | ICD-10-CM | POA: Diagnosis not present

## 2018-05-28 DIAGNOSIS — Z7902 Long term (current) use of antithrombotics/antiplatelets: Secondary | ICD-10-CM | POA: Diagnosis not present

## 2018-05-28 DIAGNOSIS — F10129 Alcohol abuse with intoxication, unspecified: Secondary | ICD-10-CM | POA: Insufficient documentation

## 2018-05-28 DIAGNOSIS — N189 Chronic kidney disease, unspecified: Secondary | ICD-10-CM | POA: Diagnosis not present

## 2018-05-28 DIAGNOSIS — Z79899 Other long term (current) drug therapy: Secondary | ICD-10-CM | POA: Insufficient documentation

## 2018-05-28 DIAGNOSIS — I130 Hypertensive heart and chronic kidney disease with heart failure and stage 1 through stage 4 chronic kidney disease, or unspecified chronic kidney disease: Secondary | ICD-10-CM | POA: Diagnosis not present

## 2018-05-28 DIAGNOSIS — F10929 Alcohol use, unspecified with intoxication, unspecified: Secondary | ICD-10-CM

## 2018-05-28 NOTE — ED Triage Notes (Signed)
Per EMS was home and call was for ETOH intoxication. Pt is alert and walked around room. Pt is slurring her speech and appears intoxicated

## 2018-05-29 ENCOUNTER — Encounter: Payer: Self-pay | Admitting: *Deleted

## 2018-05-29 ENCOUNTER — Other Ambulatory Visit: Payer: Self-pay

## 2018-05-29 LAB — ETHANOL: ALCOHOL ETHYL (B): 210 mg/dL — AB (ref <10)

## 2018-05-29 LAB — CBC
HCT: 31.5 % — ABNORMAL LOW (ref 35.0–47.0)
HEMOGLOBIN: 10.7 g/dL — AB (ref 12.0–16.0)
MCH: 30 pg (ref 26.0–34.0)
MCHC: 34.1 g/dL (ref 32.0–36.0)
MCV: 88 fL (ref 80.0–100.0)
PLATELETS: 262 10*3/uL (ref 150–440)
RBC: 3.58 MIL/uL — ABNORMAL LOW (ref 3.80–5.20)
RDW: 15.2 % — ABNORMAL HIGH (ref 11.5–14.5)
WBC: 8.3 10*3/uL (ref 3.6–11.0)

## 2018-05-29 LAB — COMPREHENSIVE METABOLIC PANEL
ALBUMIN: 3.8 g/dL (ref 3.5–5.0)
ALK PHOS: 77 U/L (ref 38–126)
ALT: 19 U/L (ref 0–44)
ANION GAP: 11 (ref 5–15)
AST: 23 U/L (ref 15–41)
BUN: 13 mg/dL (ref 8–23)
CALCIUM: 8.9 mg/dL (ref 8.9–10.3)
CHLORIDE: 106 mmol/L (ref 98–111)
CO2: 22 mmol/L (ref 22–32)
Creatinine, Ser: 1.15 mg/dL — ABNORMAL HIGH (ref 0.44–1.00)
GFR calc Af Amer: 57 mL/min — ABNORMAL LOW (ref >60)
GFR calc non Af Amer: 49 mL/min — ABNORMAL LOW (ref >60)
GLUCOSE: 148 mg/dL — AB (ref 70–99)
Potassium: 3.5 mmol/L (ref 3.5–5.1)
SODIUM: 139 mmol/L (ref 135–145)
Total Bilirubin: 0.5 mg/dL (ref 0.3–1.2)
Total Protein: 7 g/dL (ref 6.5–8.1)

## 2018-05-29 LAB — TROPONIN I: Troponin I: <0.03 ng/mL (ref <0.03)

## 2018-05-29 NOTE — ED Provider Notes (Signed)
[facility] Emergency Department Provider Note    First MD Initiated Contact with Patient 05/29/18 0006     (approximate)  I have reviewed the triage vital signs and the nursing notes.   HISTORY  Chief Complaint Alcohol Intoxication    HPI Ashley [name] is a 67 y.o. female presents to the emergency department via EMS secondary to alcohol intoxication and "I think I am having a panic attack".  Patient states that her family members were arguing which she believe provoked her panic attack.  Patient states that she "only drank 2 wine coolers this evening".  Patient denies any complaints at present.   Past Medical History:  Diagnosis Date  . AICD (automatic cardioverter/defibrillator) present    on right side  . CAD (coronary artery disease)    s/p CABG  . CHF (congestive heart failure) ([place])   . Chronic kidney disease    renal artery stenosis  . Colon cancer ([place])   . COPD (chronic obstructive pulmonary disease) ([place])   . Depression   . Dyspnea   . GERD (gastroesophageal reflux disease)   . Hx of colonic polyps   . Hyperlipidemia   . Hypertension   . Mitral valve disorder    s/p mitral valve repair wth CABG  . Myocardial infarction ([place])   . Peripheral vascular disease [facility])     Patient Active Problem List   Diagnosis Date Noted  . Hypotension 05/11/2018  . Prediabetes 05/11/2018  . Personal history of colon cancer   . First degree hemorrhoids   . Benign neoplasm of ascending colon   . Benign neoplasm of descending colon   . Polyp of sigmoid colon   . Benign neoplasm of transverse colon   . Diverticulosis of large intestine without diverticulitis   . 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 Renal artery stenosis ([place]) 10/03/2016  . AKI (acute kidney injury) ([place]) 06/21/2016  . Facial numbness 06/21/2016  . 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 Hospital discharge follow-up 02/09/2015  . Chronic  cough 10/21/2014  . Tobacco abuse 10/11/2014  . Tobacco abuse counseling 10/11/2014  . Chronic right hip pain 08/26/2014  . Fracture of clavicular shaft, closed 05/10/2014  . COPD exacerbation ([place]) 06/18/2013  . Atherosclerosis of native artery of extremity with intermittent claudication ([place]) 06/18/2013  . Routine general medical examination at a health care facility 06/18/2013  . Left-sided chest wall pain 06/18/2013  . CAD (coronary artery disease) 06/01/2013  . GERD (gastroesophageal reflux disease) 06/01/2013  . Essential hypertension, benign 06/01/2013  . Hypercholesterolemia 06/01/2013  . Tubular adenoma of colon 06/01/2013  . Major depressive disorder in remission ([place]) 06/01/2013    Past Surgical History:  Procedure Laterality Date  . arm surgery     fracture, has plates and screws  . CABG with mitral valve repair    . COLON SURGERY     colon cancer  . COLONOSCOPY WITH PROPOFOL N/A 10/09/2016   Procedure: COLONOSCOPY WITH PROPOFOL;  Surgeon: Jonathon Bellows, MD;  Location: [facility] ENDOSCOPY;  Service: Endoscopy;  Laterality: N/A;  . CORONARY ARTERY BYPASS GRAFT    . pace maker defib  2016  . PERIPHERAL VASCULAR CATHETERIZATION N/A 10/23/2016   Procedure: Renal Angiography;  Surgeon: Algernon Huxley, MD;  Location: [place] [facility];  Service: Cardiovascular;  Laterality: N/A;  . TOTAL ABDOMINAL HYSTERECTOMY  1999   history of abnormal pap    Prior to Admission medications   Medication Sig Start Date End  Date Taking? Authorizing Provider  acetaminophen (TYLENOL) 500 MG tablet Maximum 6 tablets a day. Patient taking differently: Take 500 mg by mouth every 4 (four) hours as needed for fever. Maximum 6 tablets a day. 02/09/15   Rubbie Battiest, RN  ADVAIR DISKUS 250-50 MCG/DOSE AEPB INHALE 1 PUFF BY MOUTH TWICE A DAY. RINSE MOUTH AFTER EACH USE 04/09/18   [name], MD  albuterol (PROVENTIL HFA;VENTOLIN HFA) 108 (90 Base) MCG/ACT inhaler Inhale 2 puffs into the lungs every 6 (six)  hours as needed for wheezing. 05/08/18   [name], MD  ALPRAZolam Duanne Moron) 0.5 MG tablet TAKE ONE TABLET BY MOUTH AT BEDTIME AS NEEDED FOR ANXIETY 05/08/18   [name], MD  aspirin 81 MG tablet Take 81 mg by mouth daily.    [provider]  atorvastatin (LIPITOR) 40 MG tablet Take 1 tablet (40 mg total) by mouth daily. 05/08/18   [name], MD  carvedilol (COREG) 6.25 MG tablet TAKE ONE TABLET BY MOUTH TWICE DAILY WITH MEAL 04/09/18   [name], MD  clopidogrel (PLAVIX) 75 MG tablet TAKE ONE TABLET BY MOUTH EVERY DAY 04/09/18   [name], MD  escitalopram (LEXAPRO) 10 MG tablet TAKE ONE TABLET BY MOUTH EVERY DAY 04/09/18   [name], MD  furosemide (LASIX) 20 MG tablet TAKE ONE TABLET BY MOUTH EVERY DAY 04/09/18   [name], MD  lisinopril (PRINIVIL,ZESTRIL) 20 MG tablet TAKE ONE TABLET BY MOUTH EVERY DAY 04/09/18   [name], MD  nitroGLYCERIN (NITROSTAT) 0.4 MG SL tablet Place 1 tablet (0.4 mg total) under the tongue every 5 (five) minutes as needed for chest pain. 06/19/16   [name], MD  omeprazole (PRILOSEC) 40 MG capsule TAKE ONE CAPSULE BY MOUTH EVERY DAY 04/09/18   [name], MD  SPIRIVA RESPIMAT 2.5 MCG/ACT AERS ACTIVATE AND INHALE TWO SPRAYS ORALLY INTO THE LUNGS ONCE DAILY. 01/09/18   [name], MD    Allergies Hydrocodone-acetaminophen  Family History  Problem Relation Age of Onset  . Arthritis Mother   . Cancer Mother        uterus cancer  . Hyperlipidemia Mother   . Hypertension Mother   . Heart disease Mother   . Diabetes Mother   . Hyperlipidemia Father   . Hypertension Father   . Heart disease Father   . Diabetes Father   . Cancer Sister        ovary cancer  . Diabetes Maternal Grandmother   . Hypertension Maternal Grandmother   . Arthritis Maternal Grandmother   . Hypertension Maternal Grandfather   . Hypertension Paternal Grandmother   . Hypertension Paternal Grandfather   . Heart disease Paternal  Grandfather   . Breast cancer Neg Hx     Social History Social History   Tobacco Use  . Smoking status: Current Every Day Smoker    Packs/day: 0.50    Types: Cigarettes  . Smokeless tobacco: Never Used  . Tobacco comment: 1/2-1 ppd  Substance Use Topics  . Alcohol use: Yes    Alcohol/week: 1.2 oz    Types: 2 Shots of liquor per week  . Drug use: No    Review of Systems Constitutional: No fever/chills Eyes: No visual changes. ENT: No sore throat. Cardiovascular: Denies chest pain. Respiratory: Denies shortness of breath. Gastrointestinal: No abdominal pain.  No nausea, no vomiting.  No diarrhea.  No constipation. Genitourinary: Negative for dysuria. Musculoskeletal: Negative for neck  pain.  Negative for back pain. Integumentary: Negative for rash. Neurological: Negative for headaches, focal weakness or numbness. Psychiatric:Positive for "panic attack"   ____________________________________________   PHYSICAL EXAM:  VITAL SIGNS: ED Triage Vitals [05/29/18 0000]  Enc Vitals Group     BP 112/64     Pulse Rate 88     Resp 17     Temp      Temp src      SpO2 98 %     Weight 77.1 kg (170 lb)     Height 1.626 m (5\' 4" )     Head Circumference      Peak Flow      Pain Score 0     Pain Loc      Pain Edu?      Excl. in [place]?     Constitutional: Alert and oriented.  Appears intoxicated  eyes: Conjunctivae are normal. PERRL. EOMI. Head: Atraumatic. Mouth/Throat: Mucous membranes are moist.  Oropharynx non-erythematous. Neck: No stridor.   Cardiovascular: Normal rate, regular rhythm. Good peripheral circulation. Grossly normal heart sounds. Respiratory: Normal respiratory effort.  No retractions. Lungs CTAB. Gastrointestinal: Soft and nontender. No distention.  Musculoskeletal: No lower extremity tenderness nor edema. No gross deformities of extremities. Neurologic:  Normal speech and language. No gross focal neurologic deficits are appreciated.  Skin:  Skin is  warm, dry and intact. No rash noted. Psychiatric: Mood and affect are normal. Speech and behavior are normal.  ____________________________________________   LABS (all labs ordered are listed, but only abnormal results are displayed)  Labs Reviewed  CBC - Abnormal; Notable for the following components:      Result Value   RBC 3.58 (*)    Hemoglobin 10.7 (*)    HCT 31.5 (*)    RDW 15.2 (*)    All other components within normal limits  COMPREHENSIVE METABOLIC PANEL - Abnormal; Notable for the following components:   Glucose, Bld 148 (*)    Creatinine, Ser 1.15 (*)    GFR calc non Af Amer 49 (*)    GFR calc Af Amer 57 (*)    All other components within normal limits  ETHANOL - Abnormal; Notable for the following components:   Alcohol, Ethyl (B) 210 (*)    All other components within normal limits  TROPONIN I   ____________________________________________  EKG  ED ECG REPORT I, Gilson N Nadie Fiumara, the attending physician, personally viewed and interpreted this ECG.   Date: 05/29/2018  EKG Time: 12:02 AM  Rate: 74  Rhythm: Sensed ventricular paced rhythm  Axis: Normal  Intervals: Normal  ST&T Change: None  ____________________________________________    Procedures   ____________________________________________   INITIAL IMPRESSION / ASSESSMENT AND PLAN / ED COURSE  As part of my medical decision making, I reviewed the following data within the electronic MEDICAL RECORD NUMBER   67 year old female presenting with above-stated history and physical exam who appeared clinically intoxicated on arrival.  Patient's EtOH level 210.  Patient is now alert and oriented with no complaints.  Patient will be discharged in the custody of her family.  ____________________________________  FINAL CLINICAL IMPRESSION(S) / ED DIAGNOSES  Final diagnoses:  Alcoholic intoxication with complication ([place])     MEDICATIONS GIVEN DURING THIS VISIT:  Medications - No data to display   ED  Discharge Orders    None       Note:  This document was prepared using Dragon voice recognition software and may include unintentional dictation errors.    Owens Shark,  Valli Glance, MD 05/29/18 [phone]

## 2018-06-26 ENCOUNTER — Emergency Department: Payer: Medicare Other

## 2018-06-26 ENCOUNTER — Emergency Department
Admission: EM | Admit: 2018-06-26 | Discharge: 2018-06-26 | Disposition: A | Discharge status: Home / Self Care | Payer: Medicare Other | Attending: Emergency Medicine | Admitting: Emergency Medicine

## 2018-06-26 ENCOUNTER — Other Ambulatory Visit: Payer: Self-pay

## 2018-06-26 DIAGNOSIS — Z95810 Presence of automatic (implantable) cardiac defibrillator: Secondary | ICD-10-CM | POA: Diagnosis not present

## 2018-06-26 DIAGNOSIS — I130 Hypertensive heart and chronic kidney disease with heart failure and stage 1 through stage 4 chronic kidney disease, or unspecified chronic kidney disease: Secondary | ICD-10-CM | POA: Insufficient documentation

## 2018-06-26 DIAGNOSIS — J449 Chronic obstructive pulmonary disease, unspecified: Secondary | ICD-10-CM | POA: Diagnosis not present

## 2018-06-26 DIAGNOSIS — F17210 Nicotine dependence, cigarettes, uncomplicated: Secondary | ICD-10-CM | POA: Diagnosis not present

## 2018-06-26 DIAGNOSIS — R0602 Shortness of breath: Secondary | ICD-10-CM | POA: Diagnosis not present

## 2018-06-26 DIAGNOSIS — I2510 Atherosclerotic heart disease of native coronary artery without angina pectoris: Secondary | ICD-10-CM | POA: Diagnosis not present

## 2018-06-26 DIAGNOSIS — N189 Chronic kidney disease, unspecified: Secondary | ICD-10-CM | POA: Insufficient documentation

## 2018-06-26 DIAGNOSIS — R0600 Dyspnea, unspecified: Secondary | ICD-10-CM | POA: Diagnosis not present

## 2018-06-26 DIAGNOSIS — I110 Hypertensive heart disease with heart failure: Secondary | ICD-10-CM | POA: Diagnosis not present

## 2018-06-26 DIAGNOSIS — I5023 Acute on chronic systolic (congestive) heart failure: Secondary | ICD-10-CM | POA: Diagnosis not present

## 2018-06-26 LAB — BASIC METABOLIC PANEL
ANION GAP: 10 (ref 5–15)
BUN: 13 mg/dL (ref 8–23)
CO2: 22 mmol/L (ref 22–32)
Calcium: 8.2 mg/dL — ABNORMAL LOW (ref 8.9–10.3)
Chloride: 110 mmol/L (ref 98–111)
Creatinine, Ser: 1.44 mg/dL — ABNORMAL HIGH (ref 0.44–1.00)
GFR calc non Af Amer: 37 mL/min — ABNORMAL LOW (ref >60)
GFR, EST AFRICAN AMERICAN: 43 mL/min — AB (ref >60)
GLUCOSE: 322 mg/dL — AB (ref 70–99)
POTASSIUM: 4.5 mmol/L (ref 3.5–5.1)
Sodium: 142 mmol/L (ref 135–145)

## 2018-06-26 LAB — CBC WITH DIFFERENTIAL/PLATELET
BASOS ABS: 0.1 10*3/uL (ref 0–0.1)
Basophils Relative: 2 %
Eosinophils Absolute: 0.1 10*3/uL (ref 0–0.7)
Eosinophils Relative: 2 %
HEMATOCRIT: 31 % — AB (ref 35.0–47.0)
Hemoglobin: 10.2 g/dL — ABNORMAL LOW (ref 12.0–16.0)
LYMPHS PCT: 21 %
Lymphs Abs: 1.3 10*3/uL (ref 1.0–3.6)
MCH: 29.7 pg (ref 26.0–34.0)
MCHC: 33 g/dL (ref 32.0–36.0)
MCV: 90 fL (ref 80.0–100.0)
Monocytes Absolute: 0.5 10*3/uL (ref 0.2–0.9)
Monocytes Relative: 8 %
NEUTROS ABS: 4.1 10*3/uL (ref 1.4–6.5)
Neutrophils Relative %: 67 %
Platelets: 273 10*3/uL (ref 150–440)
RBC: 3.44 MIL/uL — AB (ref 3.80–5.20)
RDW: 16.7 % — ABNORMAL HIGH (ref 11.5–14.5)
WBC: 6.2 10*3/uL (ref 3.6–11.0)

## 2018-06-26 LAB — BRAIN NATRIURETIC PEPTIDE: B Natriuretic Peptide: 1138 pg/mL — ABNORMAL HIGH (ref 0.0–100.0)

## 2018-06-26 LAB — TROPONIN I

## 2018-06-26 MED ORDER — FUROSEMIDE 10 MG/ML IJ SOLN
40.0000 mg | Freq: Once | INTRAMUSCULAR | Status: AC
  Administered 2018-06-26: 40 mg via INTRAVENOUS
  Filled 2018-06-26: qty 4

## 2018-06-26 NOTE — Discharge Instructions (Addendum)
Double your Lasix for the next 3 days.  Return for worsening or worrisome symptoms.

## 2018-06-26 NOTE — ED Notes (Signed)
Report to Kate, RN

## 2018-06-26 NOTE — ED Notes (Signed)
Pt up to bathroom.

## 2018-06-26 NOTE — ED Notes (Signed)
ED Provider at bedside. 

## 2018-06-26 NOTE — ED Triage Notes (Signed)
To ER via [facility] from home c/o SOB upon awakening this AM. EMS reports pt was fatigued upon arrival, with increased RR rate and WOB. Pt given 1 albuterol nebulizer, 1 duoneb nebulizer, 125mg  solumedrol via PIV and 2g Magnesium Sulfate IVPB PTA. Pt arrives on CPAP. Pt oxygen initially 88% on RA. 20 G to L AC started by EMS. RT at bedside. EDP at bedside. Alert and oriented X 4, color WNL. WOB WNL, RR even and symmetrical

## 2018-06-26 NOTE — ED Provider Notes (Signed)
[facility] Emergency Department Provider Note       Time seen: ----------------------------------------- 8:26 AM on 06/26/2018 -----------------------------------------   I have reviewed the triage vital signs and the nursing notes.  HISTORY   Chief Complaint Shortness of Breath    HPI Ashley [name] is a 67 y.o. female with a history of coronary artery disease, CHF, chronic kidney disease, COPD, hyperlipidemia and hypertension who presents to the ED for shortness of breath that occurred upon waking this morning.  She was found to be 80% on room air.  She was having severe dyspnea, was placed on CPAP by EMS.  She was also given albuterol, DuoNeb and Solu-Medrol with 2 g of magnesium.  She arrives with improved work of breathing and less short of breath.  She denies any recent illness.  Past Medical History:  Diagnosis Date  . AICD (automatic cardioverter/defibrillator) present    on right side  . CAD (coronary artery disease)    s/p CABG  . CHF (congestive heart failure) ([place])   . Chronic kidney disease    renal artery stenosis  . Colon cancer ([place])   . COPD (chronic obstructive pulmonary disease) ([place])   . Depression   . Dyspnea   . GERD (gastroesophageal reflux disease)   . Hx of colonic polyps   . Hyperlipidemia   . Hypertension   . Mitral valve disorder    s/p mitral valve repair wth CABG  . Myocardial infarction ([place])   . Peripheral vascular disease [facility])     Patient Active Problem List   Diagnosis Date Noted  . Hypotension 05/11/2018  . Prediabetes 05/11/2018  . Personal history of colon cancer   . First degree hemorrhoids   . Benign neoplasm of ascending colon   . Benign neoplasm of descending colon   . Polyp of sigmoid colon   . Benign neoplasm of transverse colon   . Diverticulosis of large intestine without diverticulitis   . 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 Renal artery stenosis ([place]) 10/03/2016  . AKI (acute  kidney injury) ([place]) 06/21/2016  . Facial numbness 06/21/2016  . 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 Hospital discharge follow-up 02/09/2015  . Chronic cough 10/21/2014  . Tobacco abuse 10/11/2014  . Tobacco abuse counseling 10/11/2014  . Chronic right hip pain 08/26/2014  . Fracture of clavicular shaft, closed 05/10/2014  . COPD exacerbation ([place]) 06/18/2013  . Atherosclerosis of native artery of extremity with intermittent claudication ([place]) 06/18/2013  . Routine general medical examination at a health care facility 06/18/2013  . Left-sided chest wall pain 06/18/2013  . CAD (coronary artery disease) 06/01/2013  . GERD (gastroesophageal reflux disease) 06/01/2013  . Essential hypertension, benign 06/01/2013  . Hypercholesterolemia 06/01/2013  . Tubular adenoma of colon 06/01/2013  . 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 arm surgery     fracture, has plates and screws  . CABG with mitral valve repair    . COLON SURGERY     colon cancer  . COLONOSCOPY WITH PROPOFOL N/A 10/09/2016   Procedure: COLONOSCOPY WITH PROPOFOL;  Surgeon: Jonathon Bellows, MD;  Location: [facility] ENDOSCOPY;  Service: Endoscopy;  Laterality: N/A;  . CORONARY ARTERY BYPASS GRAFT    . pace maker defib  2016  . PERIPHERAL VASCULAR CATHETERIZATION N/A 10/23/2016   Procedure: Renal Angiography;  Surgeon: Algernon Huxley, MD;  Location: [facility];  Service: Cardiovascular;  Laterality:  N/A;  . TOTAL ABDOMINAL HYSTERECTOMY  1999   history of abnormal pap    Allergies Hydrocodone-acetaminophen  Social History Social History   Tobacco Use  . Smoking status: Current Every Day Smoker    Packs/day: 0.50    Types: Cigarettes  . Smokeless tobacco: Never Used  . Tobacco comment: 1/2-1 ppd  Substance Use Topics  . Alcohol use: Yes    Alcohol/week: 2.0 standard drinks    Types: 2 Shots of liquor per  week  . Drug use: No   Review of Systems Constitutional: Negative for fever. Cardiovascular: Negative for chest pain. Respiratory: Positive for shortness of breath Gastrointestinal: Negative for abdominal pain, vomiting and diarrhea. Musculoskeletal: Negative for back pain. Skin: Negative for rash. Neurological: Negative for headaches, focal weakness or numbness.  All systems negative/normal/unremarkable except as stated in the HPI  ____________________________________________   PHYSICAL EXAM:  VITAL SIGNS: ED Triage Vitals  Enc Vitals Group     BP      Pulse      Resp      Temp      Temp src      SpO2      Weight      Height      Head Circumference      Peak Flow      Pain Score      Pain Loc      Pain Edu?      Excl. in [place]?    Constitutional: Alert and oriented.  Mild distress Eyes: Conjunctivae are normal. Normal extraocular movements. ENT   Head: Normocephalic and atraumatic.   Nose: No congestion/rhinnorhea.   Mouth/Throat: Mucous membranes are moist.   Neck: No stridor. Cardiovascular: Normal rate, regular rhythm. No murmurs, rubs, or gallops. Respiratory: Mild tachypnea with mostly clear with rales in the bases at this time. Gastrointestinal: Soft and nontender. Normal bowel sounds Musculoskeletal: Nontender with normal range of motion in extremities. No lower extremity tenderness nor edema. Neurologic:  Normal speech and language. No gross focal neurologic deficits are appreciated.  Skin:  Skin is warm, dry and intact. No rash noted. Psychiatric: Mood and affect are normal. Speech and behavior are normal.  ____________________________________________  EKG: Interpreted by me.  Ventricular paced complexes with a rate of 90 bpm, normal pacemaker function is noted  ____________________________________________  ED COURSE:  As part of my medical decision making, I reviewed the following data within the [place] History  obtained from family if available, nursing notes, old chart and ekg, as well as notes from prior ED visits. Patient presented for dyspnea, we will assess with labs and imaging as indicated at this time.   Procedures ____________________________________________   LABS (pertinent positives/negatives)  Labs Reviewed  CBC WITH DIFFERENTIAL/PLATELET - Abnormal; Notable for the following components:      Result Value   RBC 3.44 (*)    Hemoglobin 10.2 (*)    HCT 31.0 (*)    RDW 16.7 (*)    All other components within normal limits  BASIC METABOLIC PANEL - Abnormal; Notable for the following components:   Glucose, Bld 322 (*)    Creatinine, Ser 1.44 (*)    Calcium 8.2 (*)    GFR calc non Af Amer 37 (*)    GFR calc Af Amer 43 (*)    All other components within normal limits  BRAIN NATRIURETIC PEPTIDE - Abnormal; Notable for the following components:   B Natriuretic Peptide 1,138.0 (*)    All  other components within normal limits  TROPONIN I    RADIOLOGY Images were viewed by me  Chest x-ray IMPRESSION: Cardiomegaly with vascular congestion. Diffuse interstitial prominence could reflect interstitial edema or chronic lung disease.  Bibasilar atelectasis or scarring. ____________________________________________  DIFFERENTIAL DIAGNOSIS   COPD, CHF, pneumonia, hypertensive emergency, PE, pneumothorax  FINAL ASSESSMENT AND PLAN  Dyspnea, CHF exacerbation   Plan: The patient had presented for acute shortness of breath and was initially on CPAP. Patient's labs do not reveal any acute process although she does have hyperglycemia. Patient's imaging revealed cardiomegaly with vascular congestion suggestive of CHF exacerbation, she was given an extra 40 mg of Lasix and diuresed about a liter.  She was able to ambulate without any oxygen or hypoxia.  I will have her double her Lasix for the next 3 days.  Overall she is cleared for outpatient follow-up.   Laurence Aly,  MD   Note: This note was generated in part or whole with voice recognition software. Voice recognition is usually quite accurate but there are transcription errors that can and very often do occur. I apologize for any typographical errors that were not detected and corrected.     Earleen Newport, MD 06/26/18 [phone]

## 2018-06-26 NOTE — ED Notes (Signed)
Pt ambulated up and down hallway without oxygen on. Pt ambulated well. Oxygen levels on room air stayed 98-100%.

## 2018-06-27 ENCOUNTER — Telehealth: Payer: Self-pay | Admitting: Internal Medicine

## 2018-06-27 NOTE — Telephone Encounter (Signed)
Yes 11:30 is fine

## 2018-06-27 NOTE — Telephone Encounter (Signed)
Would it be ok to schedule pt for a hospital follow up tomorrow 06/28/2018 at 11:30am?

## 2018-06-27 NOTE — Telephone Encounter (Signed)
Copied from [place] [id number]. Topic: Inquiry >> Jun 27, 2018  2:06 PM Oliver Pila B wrote: Reason for CRM: pt was released from the hospital and the doctors suggested that the pt discuss and see pcp to get a nebulizer; pt felt its too long to wait to see pcp until Sept 9th; contact pt to advise

## 2018-06-27 NOTE — Telephone Encounter (Signed)
Please advise 

## 2018-06-28 ENCOUNTER — Ambulatory Visit (INDEPENDENT_AMBULATORY_CARE_PROVIDER_SITE_OTHER): Payer: Medicare Other | Admitting: Internal Medicine

## 2018-06-28 VITALS — BP 112/70 | HR 86 | Temp 98.2°F | Resp 18 | Wt 172.6 lb

## 2018-06-28 DIAGNOSIS — D519 Vitamin B12 deficiency anemia, unspecified: Secondary | ICD-10-CM | POA: Insufficient documentation

## 2018-06-28 DIAGNOSIS — D126 Benign neoplasm of colon, unspecified: Secondary | ICD-10-CM | POA: Diagnosis not present

## 2018-06-28 DIAGNOSIS — I5023 Acute on chronic systolic (congestive) heart failure: Secondary | ICD-10-CM | POA: Diagnosis not present

## 2018-06-28 DIAGNOSIS — D649 Anemia, unspecified: Secondary | ICD-10-CM | POA: Diagnosis not present

## 2018-06-28 DIAGNOSIS — R7303 Prediabetes: Secondary | ICD-10-CM

## 2018-06-28 DIAGNOSIS — J439 Emphysema, unspecified: Secondary | ICD-10-CM | POA: Insufficient documentation

## 2018-06-28 DIAGNOSIS — I150 Renovascular hypertension: Secondary | ICD-10-CM | POA: Diagnosis not present

## 2018-06-28 DIAGNOSIS — I509 Heart failure, unspecified: Secondary | ICD-10-CM | POA: Insufficient documentation

## 2018-06-28 MED ORDER — IPRATROPIUM-ALBUTEROL 0.5-2.5 (3) MG/3ML IN SOLN: 3.0000 mL | Freq: Four times a day (QID) | RESPIRATORY_TRACT | 1 refills | Status: DC | PRN

## 2018-06-28 NOTE — Telephone Encounter (Signed)
Spoke with pt and she stated that she would be here at 11:30am. Pt has been placed on the schedule per Dr. Lupita Dawn okay.

## 2018-06-28 NOTE — Assessment & Plan Note (Signed)
Last colonoscopy Dc 2017,  Follow up due 2020 for multiple polyps removed

## 2018-06-28 NOTE — Assessment & Plan Note (Signed)
Secondary to tobacco abuse.  Home nebullizer needed,  Smoking cessation needed and STRONGLY Advised.

## 2018-06-28 NOTE — Patient Instructions (Addendum)
Weigh yourself daily after your first morning urination If weight increases by 2 lbs or more overnight,  Take an extra 20 mg of lasix daily until weight returns to previous weigh  Of weight is up by 5 lbs by the end of the week , tkke extra dose of lasix  Daily until weight returns to previous weight   sodium restriction of 4 grams daily  You are anemic.  When you return on Monday for labs, additional  Labs will be done to figure out why  A home nebulizer has been ordered    Low-Sodium Eating Plan Sodium, which is an element that makes up salt, helps you maintain a healthy balance of fluids in your body. Too much sodium can increase your blood pressure and cause fluid and waste to be held in your body. Your health care provider or dietitian may recommend following this plan if you have high blood pressure (hypertension), kidney disease, liver disease, or heart failure. Eating less sodium can help lower your blood pressure, reduce swelling, and protect your heart, liver, and kidneys. What are tips for following this plan? General guidelines  Most people on this plan should limit their sodium intake to 1,500-2,000 mg (milligrams) of sodium each day. Reading food labels  The Nutrition Facts label lists the amount of sodium in one serving of the food. If you eat more than one serving, you must multiply the listed amount of sodium by the number of servings.  Choose foods with less than 140 mg of sodium per serving.  Avoid foods with 300 mg of sodium or more per serving. Shopping  Look for lower-sodium products, often labeled as "low-sodium" or "no salt added."  Always check the sodium content even if foods are labeled as "unsalted" or "no salt added".  Buy fresh foods. ? Avoid canned foods and premade or frozen meals. ? Avoid canned, cured, or processed meats  Buy breads that have less than 80 mg of sodium per slice. Cooking  Eat more home-cooked food and less restaurant, buffet, and  fast food.  Avoid adding salt when cooking. Use salt-free seasonings or herbs instead of table salt or sea salt. Check with your health care provider or pharmacist before using salt substitutes.  Cook with plant-based oils, such as canola, sunflower, or olive oil. Meal planning  When eating at a restaurant, ask that your food be prepared with less salt or no salt, if possible.  Avoid foods that contain MSG (monosodium glutamate). MSG is sometimes added to [place] food, bouillon, and some canned foods. What foods are recommended? The items listed may not be a complete list. Talk with your dietitian about what dietary choices are best for you. Grains Low-sodium cereals, including oats, puffed wheat and rice, and shredded wheat. Low-sodium crackers. Unsalted rice. Unsalted pasta. Low-sodium bread. Whole-grain breads and whole-grain pasta. Vegetables Fresh or frozen vegetables. "No salt added" canned vegetables. "No salt added" tomato sauce and paste. Low-sodium or reduced-sodium tomato and vegetable juice. Fruits Fresh, frozen, or canned fruit. Fruit juice. Meats and other protein foods Fresh or frozen (no salt added) meat, poultry, seafood, and fish. Low-sodium canned tuna and salmon. Unsalted nuts. Dried peas, beans, and lentils without added salt. Unsalted canned beans. Eggs. Unsalted nut butters. Dairy Milk. Soy milk. Cheese that is naturally low in sodium, such as ricotta cheese, fresh mozzarella, or Swiss cheese Low-sodium or reduced-sodium cheese. Cream cheese. Yogurt. Fats and oils Unsalted butter. Unsalted margarine with no trans fat. Vegetable oils such as  canola or olive oils. Seasonings and other foods Fresh and dried herbs and spices. Salt-free seasonings. Low-sodium mustard and ketchup. Sodium-free salad dressing. Sodium-free light mayonnaise. Fresh or refrigerated horseradish. Lemon juice. Vinegar. Homemade, reduced-sodium, or low-sodium soups. Unsalted popcorn and pretzels.  Low-salt or salt-free chips. What foods are not recommended? The items listed may not be a complete list. Talk with your dietitian about what dietary choices are best for you. Grains Instant hot cereals. Bread stuffing, pancake, and biscuit mixes. Croutons. Seasoned rice or pasta mixes. Noodle soup cups. Boxed or frozen macaroni and cheese. Regular salted crackers. Self-rising flour. Vegetables Sauerkraut, pickled vegetables, and relishes. Olives. [place] fries. Onion rings. Regular canned vegetables (not low-sodium or reduced-sodium). Regular canned tomato sauce and paste (not low-sodium or reduced-sodium). Regular tomato and vegetable juice (not low-sodium or reduced-sodium). Frozen vegetables in sauces. Meats and other protein foods Meat or fish that is salted, canned, smoked, spiced, or pickled. Bacon, ham, sausage, hotdogs, corned beef, chipped beef, packaged lunch meats, salt pork, jerky, pickled herring, anchovies, regular canned tuna, sardines, salted nuts. Dairy Processed cheese and cheese spreads. Cheese curds. Blue cheese. Feta cheese. String cheese. Regular cottage cheese. Buttermilk. Canned milk. Fats and oils Salted butter. Regular margarine. Ghee. Bacon fat. Seasonings and other foods Onion salt, garlic salt, seasoned salt, table salt, and sea salt. Canned and packaged gravies. Worcestershire sauce. Tartar sauce. Barbecue sauce. Teriyaki sauce. Soy sauce, including reduced-sodium. Steak sauce. Fish sauce. Oyster sauce. Cocktail sauce. Horseradish that you find on the shelf. Regular ketchup and mustard. Meat flavorings and tenderizers. Bouillon cubes. Hot sauce and Tabasco sauce. Premade or packaged marinades. Premade or packaged taco seasonings. Relishes. Regular salad dressings. Salsa. Potato and tortilla chips. Corn chips and puffs. Salted popcorn and pretzels. Canned or dried soups. Pizza. Frozen entrees and pot pies. Summary  Eating less sodium can help lower your blood pressure,  reduce swelling, and protect your heart, liver, and kidneys.  Most people on this plan should limit their sodium intake to 1,500-2,000 mg (milligrams) of sodium each day.  Canned, boxed, and frozen foods are high in sodium. Restaurant foods, fast foods, and pizza are also very high in sodium. You also get sodium by adding salt to food.  Try to cook at home, eat more fresh fruits and vegetables, and eat less fast food, canned, processed, or prepared foods. This information is not intended to replace advice given to you by your health care provider. Make sure you discuss any questions you have with your health care provider. Document Released: 04/14/2002 Document Revised: 10/16/2016 Document Reviewed: 10/16/2016 Elsevier Interactive Patient Education  [employer].

## 2018-06-28 NOTE — Assessment & Plan Note (Signed)
Controlled currently.  Has amlodipine for prn use.

## 2018-06-28 NOTE — Assessment & Plan Note (Signed)
Etiology unclear.  Iron , B12 and IFOB ordered.

## 2018-06-28 NOTE — Progress Notes (Signed)
Subjective:  Patient ID: Ashley [name], female    DOB: 1951-04-07  Age: 67 y.o. MRN: [id number]  CC: The primary encounter diagnosis was Pulmonary emphysema, unspecified emphysema type ([place]). Diagnoses of Anemia, unspecified type, Prediabetes, Renovascular hypertension, Acute on chronic systolic congestive heart failure ([place]), and Tubular adenoma of colon were also pertinent to this visit.   ER follow up.  Treated on July 23 for alcohol intoxication  (BAL was 210,  Normal cutoff is < 10)  And self described panic attack.  ("only drank 2 wine coolers." but today admits that she was at a party )  Sent home  Seen again on august 21 for acute on chronic systolic dysfunction  On August 21 .  Treated in the field by EMS for dyspnea,  With nebs 02 and CPAP for  hypoxia  On room air sats of  80% AND Taken to ER.  Chest x ray showed PVC,  BNP was 1140  .  She was given  40 mg lasix and diuresed 1 liter  Ambulatory sats were "anomral" and she was discharged home.    LASIX DOSE WAS INCREASED from 20 mg to 40 mg  Daily.  Patient states that she had been having  orthopnea  For a week leading up to event.  Not weighing herself daily. Still smoking   8 cigs  Per day,  Has COPD.   Takes 20 mg lasix daily ,  Now on 40 mg since ER visit,  Has been feeling dizzy with sudden position changes.     Lab Results  Component Value Date   HGBA1C 6.2 05/06/2018      Anemia 10.2  Cr 1.44  (basline 0.99)  Glucose serum 322   .      Outpatient Medications Prior to Visit  Medication Sig Dispense Refill  . acetaminophen (TYLENOL) 500 MG tablet Maximum 6 tablets a day. (Patient taking differently: Take 500 mg by mouth every 4 (four) hours as needed for fever. Maximum 6 tablets a day.) 42 tablet 0  . ADVAIR DISKUS 250-50 MCG/DOSE AEPB INHALE 1 PUFF BY MOUTH TWICE A DAY. RINSE MOUTH AFTER EACH USE (Patient taking differently: Inhale 1 puff into the lungs 2 (two) times daily. ) 180 each 1  . albuterol  (PROVENTIL HFA;VENTOLIN HFA) 108 (90 Base) MCG/ACT inhaler Inhale 2 puffs into the lungs every 6 (six) hours as needed for wheezing. 18 g 2  . ALPRAZolam (XANAX) 0.5 MG tablet TAKE ONE TABLET BY MOUTH AT BEDTIME AS NEEDED FOR ANXIETY (Patient taking differently: Take 0.25-0.5 mg by mouth at bedtime. TAKE ONE TABLET BY MOUTH AT BEDTIME AS NEEDED FOR ANXIETY) 30 tablet 5  . aspirin 81 MG tablet Take 81 mg by mouth daily.    [name] atorvastatin (LIPITOR) 40 MG tablet Take 1 tablet (40 mg total) by mouth daily. 90 tablet 1  . carvedilol (COREG) 6.25 MG tablet TAKE ONE TABLET BY MOUTH TWICE DAILY WITH MEAL (Patient taking differently: Take 6.25 mg by mouth 2 (two) times daily with a meal. ) 180 tablet 1  . clopidogrel (PLAVIX) 75 MG tablet TAKE ONE TABLET BY MOUTH EVERY DAY 90 tablet 1  . diphenhydrAMINE (DIPHENHIST) 25 mg capsule Take 25 mg by mouth at bedtime.    [name] escitalopram (LEXAPRO) 10 MG tablet TAKE ONE TABLET BY MOUTH EVERY DAY 90 tablet 1  . furosemide (LASIX) 20 MG tablet TAKE ONE TABLET BY MOUTH EVERY DAY 90 tablet 1  . lisinopril (PRINIVIL,ZESTRIL) 20 MG tablet  TAKE ONE TABLET BY MOUTH EVERY DAY 90 tablet 1  . nitroGLYCERIN (NITROSTAT) 0.4 MG SL tablet Place 1 tablet (0.4 mg total) under the tongue every 5 (five) minutes as needed for chest pain. 30 tablet 3  . omeprazole (PRILOSEC) 40 MG capsule TAKE ONE CAPSULE BY MOUTH EVERY DAY 90 capsule 1  . SPIRIVA RESPIMAT 2.5 MCG/ACT AERS ACTIVATE AND INHALE TWO SPRAYS ORALLY INTO THE LUNGS ONCE DAILY. (Patient taking differently: Inhale 1 puff into the lungs 2 (two) times daily. ) 4 g 0   No facility-administered medications prior to visit.     Review of Systems;  Patient denies headache, fevers, malaise, unintentional weight loss, skin rash, eye pain, sinus congestion and sinus pain, sore throat, dysphagia,  hemoptysis , cough, dyspnea, wheezing, chest pain, palpitations, orthopnea, edema, abdominal pain, nausea, melena, diarrhea, constipation, flank  pain, dysuria, hematuria, urinary  Frequency, nocturia, numbness, tingling, seizures,  Focal weakness, Loss of consciousness,  Tremor, insomnia, depression, anxiety, and suicidal ideation.      Objective:  BP 112/70 (BP Location: Left Arm, Patient Position: Sitting, Cuff Size: Normal)   Pulse 86   Temp 98.2 F (36.8 C) (Oral)   Resp 18   Wt 172 lb 9.6 oz (78.3 kg)   SpO2 98%   BMI 29.63 kg/m   BP Readings from Last 3 Encounters:  06/28/18 112/70  06/26/18 (!) 175/109  05/29/18 103/62    Wt Readings from Last 3 Encounters:  06/28/18 172 lb 9.6 oz (78.3 kg)  06/26/18 170 lb (77.1 kg)  05/29/18 170 lb (77.1 kg)    General appearance: alert, cooperative and appears stated age Ears: normal TM's and external ear canals both ears Throat: lips, mucosa, and tongue normal; teeth and gums normal Neck: no adenopathy, no carotid bruit, supple, symmetrical, trachea midline and thyroid not enlarged, symmetric, no tenderness/mass/nodules Back: symmetric, no curvature. ROM normal. No CVA tenderness. Lungs: clear to auscultation bilaterally Heart: regular rate and rhythm, S1, S2 normal, no murmur, click, rub or gallop Abdomen: soft, non-tender; bowel sounds normal; no masses,  no organomegaly Pulses: 2+ and symmetric Skin: Skin color, texture, turgor normal. No rashes or lesions Lymph nodes: Cervical, supraclavicular, and axillary nodes normal.  Lab Results  Component Value Date   HGBA1C 6.2 05/06/2018   HGBA1C 5.7 06/21/2016    Lab Results  Component Value Date   CREATININE 1.44 (H) 06/26/2018   CREATININE 1.15 (H) 05/29/2018   CREATININE 1.15 (H) 05/06/2018    Lab Results  Component Value Date   WBC 6.2 06/26/2018   HGB 10.2 (L) 06/26/2018   HCT 31.0 (L) 06/26/2018   PLT 273 06/26/2018   GLUCOSE 322 (H) 06/26/2018   CHOL 222 (H) 05/06/2018   TRIG 276 (H) 05/06/2018   HDL 41 (L) 05/06/2018   LDLDIRECT 155.0 06/06/2016   LDLCALC 139 (H) 05/06/2018   ALT 19 05/29/2018    AST 23 05/29/2018   NA 142 06/26/2018   K 4.5 06/26/2018   CL 110 06/26/2018   CREATININE 1.44 (H) 06/26/2018   BUN 13 06/26/2018   CO2 22 06/26/2018   TSH 2.301 06/21/2016   INR 1.1 08/19/2014   HGBA1C 6.2 05/06/2018    Dg Chest Port 1 View  Result Date: 06/26/2018 CLINICAL DATA:  Shortness of breath EXAM: PORTABLE CHEST 1 VIEW COMPARISON:  06/06/2016 FINDINGS: Right AICD remains in place, unchanged. Cardiomegaly. Prior CABG. Vascular congestion and interstitial prominence may reflect interstitial edema or chronic interstitial lung disease. Bibasilar atelectasis or scarring. IMPRESSION:  Cardiomegaly with vascular congestion. Diffuse interstitial prominence could reflect interstitial edema or chronic lung disease. Bibasilar atelectasis or scarring. Electronically Signed   By: Rolm Baptise M.D.   On: 06/26/2018 08:44    Assessment & Plan:   Problem List Items Addressed This Visit    Tubular adenoma of colon    Last colonoscopy Dc 2017,  Follow up due 2020 for multiple polyps removed       Renovascular hypertension    Controlled currently.  Has amlodipine for prn use.        Prediabetes    Advised to follow low GI diet. Hidden sources of sugar discussed  Lab Results  Component Value Date   HGBA1C 6.2 05/06/2018         COPD with emphysema ([place]) - Primary    Secondary to tobacco abuse.  Home nebullizer needed,  Smoking cessation needed and STRONGLY Advised.       Relevant Orders   DME Nebulizer machine   CHF exacerbation ([place])    With one week of orthopnea.  Treated in ER with 40 mg IV lasix.  Cr elevated,  Will reduce dose to 20 mg daily .  Weigh daily and take additional doses prn weight gain.  Salt restriction advised and handout given.  Last ECHO PER [facility]  August 2017 EF 55 to 60% . Has not seen [facility] since May 2018       Anemia    Etiology unclear.  Iron , B12 and IFOB ordered.        Relevant Orders   Fecal occult blood, imunochemical   Comprehensive  metabolic panel   Iron, TIBC and Ferritin Panel   Vitamin B12     A total of 40 minutes was spent with patient more than half of which was spent in counseling patient on the above mentioned issues , reviewing and explaining recent labs and imaging studies done, and coordination of care.   I am having Lodie A. Francisco "Debbie" maintain her aspirin, acetaminophen, nitroGLYCERIN, SPIRIVA RESPIMAT, ADVAIR DISKUS, carvedilol, omeprazole, escitalopram, clopidogrel, furosemide, lisinopril, atorvastatin, ALPRAZolam, albuterol, and diphenhydrAMINE.  No orders of the defined types were placed in this encounter.   There are no discontinued medications.  Follow-up: Return in about 4 weeks (around 07/26/2018) for chf, anemia .   [name], MD

## 2018-06-28 NOTE — Assessment & Plan Note (Signed)
With one week of orthopnea.  Treated in ER with 40 mg IV lasix.  Cr elevated,  Will reduce dose to 20 mg daily .  Weigh daily and take additional doses prn weight gain.  Salt restriction advised and handout given.  Last ECHO PER [facility]  August 2017 EF 55 to 60% . Has not seen [facility] since May 2018

## 2018-06-28 NOTE — Assessment & Plan Note (Signed)
Advised to follow low GI diet. Hidden sources of sugar discussed  Lab Results  Component Value Date   HGBA1C 6.2 05/06/2018

## 2018-07-01 ENCOUNTER — Other Ambulatory Visit: Payer: Medicare Other

## 2018-07-01 DIAGNOSIS — D649 Anemia, unspecified: Secondary | ICD-10-CM

## 2018-07-01 NOTE — Addendum Note (Signed)
Addended by: Arby Barrette on: 07/01/2018 02:01 PM   Modules accepted: Orders

## 2018-07-02 ENCOUNTER — Telehealth: Payer: Self-pay

## 2018-07-02 ENCOUNTER — Other Ambulatory Visit (INDEPENDENT_AMBULATORY_CARE_PROVIDER_SITE_OTHER): Payer: Medicare Other

## 2018-07-02 DIAGNOSIS — D649 Anemia, unspecified: Secondary | ICD-10-CM

## 2018-07-02 LAB — COMPREHENSIVE METABOLIC PANEL
AG Ratio: 1.6 (calc) (ref 1.0–2.5)
ALKALINE PHOSPHATASE (APISO): 90 U/L (ref 33–130)
ALT: 26 U/L (ref 6–29)
AST: 14 U/L (ref 10–35)
Albumin: 4 g/dL (ref 3.6–5.1)
BILIRUBIN TOTAL: 0.7 mg/dL (ref 0.2–1.2)
BUN/Creatinine Ratio: 16 (calc) (ref 6–22)
BUN: 22 mg/dL (ref 7–25)
CALCIUM: 9.1 mg/dL (ref 8.6–10.4)
CHLORIDE: 103 mmol/L (ref 98–110)
CO2: 26 mmol/L (ref 20–32)
Creat: 1.35 mg/dL — ABNORMAL HIGH (ref 0.50–0.99)
GLOBULIN: 2.5 g/dL (ref 1.9–3.7)
Glucose, Bld: 152 mg/dL — ABNORMAL HIGH (ref 65–99)
Potassium: 4.4 mmol/L (ref 3.5–5.3)
Sodium: 138 mmol/L (ref 135–146)
Total Protein: 6.5 g/dL (ref 6.1–8.1)

## 2018-07-02 LAB — IRON,TIBC AND FERRITIN PANEL
%SAT: 10 % (calc) — ABNORMAL LOW (ref 16–45)
Ferritin: 17 ng/mL (ref 16–288)
IRON: 45 ug/dL (ref 45–160)
TIBC: 452 ug/dL — AB (ref 250–450)

## 2018-07-02 LAB — FECAL OCCULT BLOOD, IMMUNOCHEMICAL: Fecal Occult Bld: POSITIVE — AB

## 2018-07-02 LAB — VITAMIN B12: VITAMIN B 12: 249 pg/mL (ref 200–1100)

## 2018-07-02 NOTE — Telephone Encounter (Signed)
Appointment made for 8/28. Thank you for the referral.

## 2018-07-03 ENCOUNTER — Other Ambulatory Visit: Payer: Self-pay | Admitting: Internal Medicine

## 2018-07-03 ENCOUNTER — Encounter: Payer: Self-pay | Admitting: Family

## 2018-07-03 ENCOUNTER — Ambulatory Visit: Payer: Medicare Other | Attending: Family | Admitting: Family

## 2018-07-03 VITALS — BP 103/66 | HR 78 | Resp 18 | Ht 64.0 in | Wt 169.1 lb

## 2018-07-03 DIAGNOSIS — J449 Chronic obstructive pulmonary disease, unspecified: Secondary | ICD-10-CM | POA: Diagnosis not present

## 2018-07-03 DIAGNOSIS — N189 Chronic kidney disease, unspecified: Secondary | ICD-10-CM | POA: Diagnosis not present

## 2018-07-03 DIAGNOSIS — F10129 Alcohol abuse with intoxication, unspecified: Secondary | ICD-10-CM | POA: Diagnosis not present

## 2018-07-03 DIAGNOSIS — K219 Gastro-esophageal reflux disease without esophagitis: Secondary | ICD-10-CM | POA: Insufficient documentation

## 2018-07-03 DIAGNOSIS — Z79899 Other long term (current) drug therapy: Secondary | ICD-10-CM | POA: Insufficient documentation

## 2018-07-03 DIAGNOSIS — E785 Hyperlipidemia, unspecified: Secondary | ICD-10-CM | POA: Diagnosis not present

## 2018-07-03 DIAGNOSIS — F329 Major depressive disorder, single episode, unspecified: Secondary | ICD-10-CM | POA: Diagnosis not present

## 2018-07-03 DIAGNOSIS — R195 Other fecal abnormalities: Secondary | ICD-10-CM

## 2018-07-03 DIAGNOSIS — I2510 Atherosclerotic heart disease of native coronary artery without angina pectoris: Secondary | ICD-10-CM | POA: Diagnosis present

## 2018-07-03 DIAGNOSIS — Z85038 Personal history of other malignant neoplasm of large intestine: Secondary | ICD-10-CM | POA: Insufficient documentation

## 2018-07-03 DIAGNOSIS — Z951 Presence of aortocoronary bypass graft: Secondary | ICD-10-CM | POA: Insufficient documentation

## 2018-07-03 DIAGNOSIS — I739 Peripheral vascular disease, unspecified: Secondary | ICD-10-CM | POA: Insufficient documentation

## 2018-07-03 DIAGNOSIS — Z95810 Presence of automatic (implantable) cardiac defibrillator: Secondary | ICD-10-CM | POA: Diagnosis not present

## 2018-07-03 DIAGNOSIS — Z7982 Long term (current) use of aspirin: Secondary | ICD-10-CM | POA: Insufficient documentation

## 2018-07-03 DIAGNOSIS — D509 Iron deficiency anemia, unspecified: Secondary | ICD-10-CM

## 2018-07-03 DIAGNOSIS — Z720 Tobacco use: Secondary | ICD-10-CM

## 2018-07-03 DIAGNOSIS — I130 Hypertensive heart and chronic kidney disease with heart failure and stage 1 through stage 4 chronic kidney disease, or unspecified chronic kidney disease: Secondary | ICD-10-CM | POA: Insufficient documentation

## 2018-07-03 DIAGNOSIS — J439 Emphysema, unspecified: Secondary | ICD-10-CM

## 2018-07-03 DIAGNOSIS — I10 Essential (primary) hypertension: Secondary | ICD-10-CM

## 2018-07-03 DIAGNOSIS — I252 Old myocardial infarction: Secondary | ICD-10-CM | POA: Diagnosis not present

## 2018-07-03 DIAGNOSIS — Z885 Allergy status to narcotic agent status: Secondary | ICD-10-CM | POA: Diagnosis not present

## 2018-07-03 DIAGNOSIS — F17210 Nicotine dependence, cigarettes, uncomplicated: Secondary | ICD-10-CM | POA: Diagnosis not present

## 2018-07-03 DIAGNOSIS — I5032 Chronic diastolic (congestive) heart failure: Secondary | ICD-10-CM

## 2018-07-03 NOTE — Progress Notes (Signed)
Lab Results  Component Value Date   WBC 6.2 06/26/2018   HGB 10.2 (L) 06/26/2018   HCT 31.0 (L) 06/26/2018   MCV 90.0 06/26/2018   PLT 273 06/26/2018

## 2018-07-03 NOTE — Patient Instructions (Addendum)
Continue weighing daily and call for an overnight weight gain of > 2 pounds or a weekly weight gain of >5 pounds.    Smoking Cessation Quitting smoking is important to your health and has many advantages. However, it is not always easy to quit since nicotine is a very addictive drug. Oftentimes, people try 3 times or more before being able to quit. This document explains the best ways for you to prepare to quit smoking. Quitting takes hard work and a lot of effort, but you can do it. ADVANTAGES OF QUITTING SMOKING  You will live longer, feel better, and live better.  Your body will feel the impact of quitting smoking almost immediately.  Within 20 minutes, blood pressure decreases. Your pulse returns to its normal level.  After 8 hours, carbon monoxide levels in the blood return to normal. Your oxygen level increases.  After 24 hours, the chance of having a heart attack starts to decrease. Your breath, hair, and body stop smelling like smoke.  After 48 hours, damaged nerve endings begin to recover. Your sense of taste and smell improve.  After 72 hours, the body is virtually free of nicotine. Your bronchial tubes relax and breathing becomes easier.  After 2 to 12 weeks, lungs can hold more air. Exercise becomes easier and circulation improves.  The risk of having a heart attack, stroke, cancer, or lung disease is greatly reduced.  After 1 year, the risk of coronary heart disease is cut in half.  After 5 years, the risk of stroke falls to the same as a nonsmoker.  After 10 years, the risk of lung cancer is cut in half and the risk of other cancers decreases significantly.  After 15 years, the risk of coronary heart disease drops, usually to the level of a nonsmoker.  If you are pregnant, quitting smoking will improve your chances of having a healthy baby.  The people you live with, especially any children, will be healthier.  You will have extra money to spend on things other  than cigarettes. QUESTIONS TO THINK ABOUT BEFORE ATTEMPTING TO QUIT You may want to talk about your answers with your health care provider.  Why do you want to quit?  If you tried to quit in the past, what helped and what did not?  What will be the most difficult situations for you after you quit? How will you plan to handle them?  Who can help you through the tough times? Your family? Friends? A health care provider?  What pleasures do you get from smoking? What ways can you still get pleasure if you quit? Here are some questions to ask your health care provider:  How can you help me to be successful at quitting?  What medicine do you think would be best for me and how should I take it?  What should I do if I need more help?  What is smoking withdrawal like? How can I get information on withdrawal? GET READY  Set a quit date.  Change your environment by getting rid of all cigarettes, ashtrays, matches, and lighters in your home, car, or work. Do not let people smoke in your home.  Review your past attempts to quit. Think about what worked and what did not. GET SUPPORT AND ENCOURAGEMENT You have a better chance of being successful if you have help. You can get support in many ways.  Tell your family, friends, and coworkers that you are going to quit and need their support. Ask   them not to smoke around you.  Get individual, group, or telephone counseling and support. Programs are available at local hospitals and health centers. Call your local health department for information about programs in your area.  Spiritual beliefs and practices may help some smokers quit.  Download a "quit meter" on your computer to keep track of quit statistics, such as how long you have gone without smoking, cigarettes not smoked, and money saved.  Get a self-help book about quitting smoking and staying off tobacco. LEARN NEW SKILLS AND BEHAVIORS  Distract yourself from urges to smoke. Talk to  someone, go for a walk, or occupy your time with a task.  Change your normal routine. Take a different route to work. Drink tea instead of coffee. Eat breakfast in a different place.  Reduce your stress. Take a hot bath, exercise, or read a book.  Plan something enjoyable to do every day. Reward yourself for not smoking.  Explore interactive web-based programs that specialize in helping you quit. GET MEDICINE AND USE IT CORRECTLY Medicines can help you stop smoking and decrease the urge to smoke. Combining medicine with the above behavioral methods and support can greatly increase your chances of successfully quitting smoking.  Nicotine replacement therapy helps deliver nicotine to your body without the negative effects and risks of smoking. Nicotine replacement therapy includes nicotine gum, lozenges, inhalers, nasal sprays, and skin patches. Some may be available over-the-counter and others require a prescription.  Antidepressant medicine helps people abstain from smoking, but how this works is unknown. This medicine is available by prescription.  Nicotinic receptor partial agonist medicine simulates the effect of nicotine in your brain. This medicine is available by prescription. Ask your health care provider for advice about which medicines to use and how to use them based on your health history. Your health care provider will tell you what side effects to look out for if you choose to be on a medicine or therapy. Carefully read the information on the package. Do not use any other product containing nicotine while using a nicotine replacement product.  RELAPSE OR DIFFICULT SITUATIONS Most relapses occur within the first 3 months after quitting. Do not be discouraged if you start smoking again. Remember, most people try several times before finally quitting. You may have symptoms of withdrawal because your body is used to nicotine. You may crave cigarettes, be irritable, feel very hungry, cough  often, get headaches, or have difficulty concentrating. The withdrawal symptoms are only temporary. They are strongest when you first quit, but they will go away within 10-14 days. To reduce the chances of relapse, try to:  Avoid drinking alcohol. Drinking lowers your chances of successfully quitting.  Reduce the amount of caffeine you consume. Once you quit smoking, the amount of caffeine in your body increases and can give you symptoms, such as a rapid heartbeat, sweating, and anxiety.  Avoid smokers because they can make you want to smoke.  Do not let weight gain distract you. Many smokers will gain weight when they quit, usually less than 10 pounds. Eat a healthy diet and stay active. You can always lose the weight gained after you quit.  Find ways to improve your mood other than smoking. FOR MORE INFORMATION  www.smokefree.gov  Document Released: 10/17/2001 Document Revised: 03/09/2014 Document Reviewed: 02/01/2012 [employer] Patient Information 2015 [employer]. This information is not intended to replace advice given to you by your health care provider. Make sure you discuss any questions you have with your   health care provider.  

## 2018-07-03 NOTE — Progress Notes (Signed)
Patient ID: Ashley [name], female    DOB: Feb 04, 1951, 67 y.o.   MRN: [id number]  HPI  Ashley [name] is a 67 y/o female with a history of CAD (CABG), hyperlipidemia, HTN, CKD, GERD, depression, COPD, PVD, current tobacco use and chronic heart failure.   Echo report from 06/21/16 reviewed and showed an EF of 55-60% along with mild AR and moderate MR.   Was in the ED 06/26/18 due to HF exacerbation. IV lasix given and she diuresed ~ 1 liter. Released the same day. Was in the ED 05/28/18 due to alcohol intoxication where she was treated and released.   She presents today for her initial visit with a chief complaint of moderate fatigue upon minimal exertion. She describes this as chronic having been present for several months. She has associated shortness of breath, wheezing and light-headedness upon position changes along with this. She denies any difficulty sleeping, abdominal distention, palpitations, pedal edema, chest pain or weight gain. Last echo was done 2017.  Past Medical History:  Diagnosis Date  . AICD (automatic cardioverter/defibrillator) present    on right side  . CAD (coronary artery disease)    s/p CABG  . CHF (congestive heart failure) ([place])   . Chronic kidney disease    renal artery stenosis  . Colon cancer ([place])   . COPD (chronic obstructive pulmonary disease) ([place])   . Depression   . Dyspnea   . GERD (gastroesophageal reflux disease)   . Hx of colonic polyps   . Hyperlipidemia   . Hypertension   . Mitral valve disorder    s/p mitral valve repair wth CABG  . Myocardial infarction ([place])   . Peripheral vascular disease [facility])    Past Surgical History:  Procedure Laterality Date  . arm surgery     fracture, has plates and screws  . CABG with mitral valve repair    . COLON SURGERY     colon cancer  . COLONOSCOPY WITH PROPOFOL N/A 10/09/2016   Procedure: COLONOSCOPY WITH PROPOFOL;  Surgeon: Jonathon Bellows, MD;  Location: [facility] ENDOSCOPY;  Service: Endoscopy;  Laterality:  N/A;  . CORONARY ARTERY BYPASS GRAFT    . pace maker defib  2016  . PERIPHERAL VASCULAR CATHETERIZATION N/A 10/23/2016   Procedure: Renal Angiography;  Surgeon: Algernon Huxley, MD;  Location: [place] [facility];  Service: Cardiovascular;  Laterality: N/A;  . TOTAL ABDOMINAL HYSTERECTOMY  1999   history of abnormal pap   Family History  Problem Relation Age of Onset  . Arthritis Mother   . Cancer Mother        uterus cancer  . Hyperlipidemia Mother   . Hypertension Mother   . Heart disease Mother   . Diabetes Mother   . Hyperlipidemia Father   . Hypertension Father   . Heart disease Father   . Diabetes Father   . Cancer Sister        ovary cancer  . Diabetes Maternal Grandmother   . Hypertension Maternal Grandmother   . Arthritis Maternal Grandmother   . Hypertension Maternal Grandfather   . Hypertension Paternal Grandmother   . Hypertension Paternal Grandfather   . Heart disease Paternal Grandfather   . Breast cancer Neg Hx    Social History   Tobacco Use  . Smoking status: Current Every Day Smoker    Packs/day: 0.50    Types: Cigarettes  . Smokeless tobacco: Never Used  . Tobacco comment: 1/2-1 ppd  Substance Use Topics  . Alcohol use:  Yes    Alcohol/week: 2.0 standard drinks    Types: 2 Shots of liquor per week   Allergies  Allergen Reactions  . Hydrocodone-Acetaminophen Nausea Only   Prior to Admission medications   Medication Sig Start Date End Date Taking? Authorizing Provider  acetaminophen (TYLENOL) 500 MG tablet Maximum 6 tablets a day. Patient taking differently: Take 500 mg by mouth every 4 (four) hours as needed for fever. Maximum 6 tablets a day. 02/09/15  Yes Doss, Velora Heckler, RN  ADVAIR DISKUS 250-50 MCG/DOSE AEPB INHALE 1 PUFF BY MOUTH TWICE A DAY. RINSE MOUTH AFTER EACH USE Patient taking differently: Inhale 1 puff into the lungs 2 (two) times daily.  04/09/18  Yes [name], MD  albuterol (PROVENTIL HFA;VENTOLIN HFA) 108 (90 Base) MCG/ACT inhaler  Inhale 2 puffs into the lungs every 6 (six) hours as needed for wheezing. 05/08/18  Yes [name], MD  ALPRAZolam Duanne Moron) 0.5 MG tablet TAKE ONE TABLET BY MOUTH AT BEDTIME AS NEEDED FOR ANXIETY Patient taking differently: Take 0.25-0.5 mg by mouth at bedtime. TAKE ONE TABLET BY MOUTH AT BEDTIME AS NEEDED FOR ANXIETY 05/08/18  Yes [name], MD  amLODipine (NORVASC) 5 MG tablet Take 5 mg by mouth daily.   Yes [provider]  aspirin 81 MG tablet Take 81 mg by mouth daily.   Yes [provider]  atorvastatin (LIPITOR) 40 MG tablet Take 1 tablet (40 mg total) by mouth daily. 05/08/18  Yes [name], MD  carvedilol (COREG) 6.25 MG tablet TAKE ONE TABLET BY MOUTH TWICE DAILY WITH MEAL Patient taking differently: Take 6.25 mg by mouth 2 (two) times daily with a meal.  04/09/18  Yes [name], MD  clopidogrel (PLAVIX) 75 MG tablet TAKE ONE TABLET BY MOUTH EVERY DAY 04/09/18  Yes [name], MD  diphenhydrAMINE (DIPHENHIST) 25 mg capsule Take 25 mg by mouth at bedtime.   Yes [provider]  escitalopram (LEXAPRO) 10 MG tablet TAKE ONE TABLET BY MOUTH EVERY DAY 04/09/18  Yes [name], MD  furosemide (LASIX) 20 MG tablet TAKE ONE TABLET BY MOUTH EVERY DAY 04/09/18  Yes [name], MD  ipratropium-albuterol (DUONEB) 0.5-2.5 (3) MG/3ML SOLN Take 3 mLs by nebulization every 6 (six) hours as needed. 06/28/18  Yes [name], MD  lisinopril (PRINIVIL,ZESTRIL) 20 MG tablet TAKE ONE TABLET BY MOUTH EVERY DAY 04/09/18  Yes [name], MD  nitroGLYCERIN (NITROSTAT) 0.4 MG SL tablet Place 1 tablet (0.4 mg total) under the tongue every 5 (five) minutes as needed for chest pain. 06/19/16  Yes [name], MD  omeprazole (PRILOSEC) 40 MG capsule TAKE ONE CAPSULE BY MOUTH EVERY DAY 04/09/18  Yes [name], MD  SPIRIVA RESPIMAT 2.5 MCG/ACT AERS ACTIVATE AND INHALE TWO SPRAYS ORALLY INTO THE LUNGS ONCE DAILY. Patient taking differently: Inhale 1 puff into  the lungs 2 (two) times daily.  01/09/18  Yes [name], MD    Review of Systems  Constitutional: Positive for fatigue (easily). Negative for appetite change.  HENT: Negative for congestion, postnasal drip and sore throat.   Eyes: Negative.   Respiratory: Positive for shortness of breath and wheezing (at times). Negative for chest tightness.   Cardiovascular: Negative for chest pain, palpitations and leg swelling.  Gastrointestinal: Negative for abdominal distention and abdominal pain.  Endocrine: Negative.   Genitourinary: Negative.   Musculoskeletal: Negative for back pain and neck pain.  Skin: Negative.  Allergic/Immunologic: Negative.   Neurological: Positive for light-headedness (when changing positions too quickly).  Hematological: Negative for adenopathy. Does not bruise/bleed easily.  Psychiatric/Behavioral: Negative for dysphoric mood and sleep disturbance (sleeping on 1 pillow). The patient is not nervous/anxious.     Vitals:   07/03/18 1150  BP: 103/66  Pulse: 78  Resp: 18  SpO2: 100%  Weight: 169 lb 2 oz (76.7 kg)  Height: 5\' 4"  (1.626 m)   Wt Readings from Last 3 Encounters:  07/03/18 169 lb 2 oz (76.7 kg)  06/28/18 172 lb 9.6 oz (78.3 kg)  06/26/18 170 lb (77.1 kg)   Lab Results  Component Value Date   CREATININE 1.35 (H) 07/01/2018   CREATININE 1.44 (H) 06/26/2018   CREATININE 1.15 (H) 05/29/2018   Physical Exam  Constitutional: She is oriented to person, place, and time. She appears well-developed and well-nourished.  HENT:  Head: Normocephalic and atraumatic.  Neck: Normal range of motion. Neck supple. No JVD present.  Cardiovascular: Normal rate and regular rhythm.  Pulmonary/Chest: Effort normal. No respiratory distress. She has no wheezes. She has no rales.  Abdominal: Soft. She exhibits no distension.  Musculoskeletal:       Right lower leg: She exhibits no tenderness and no edema.       Left lower leg: She exhibits no tenderness and no  edema.  Neurological: She is alert and oriented to person, place, and time.  Skin: Skin is warm and dry. Ecchymosis (right antecubital) noted.  Psychiatric: She has a normal mood and affect. Her behavior is normal.  Nursing note and vitals reviewed.  Assessment & Plan:  1: Chronic heart failure with preserved ejection fraction- - NYHA class III - euvolemic today - already weighing daily and she was instructed to call for an overnight weight gain of >2 pounds or a weekly weight gain of >5 pounds - if above weight gain occurs, she's already been told to take an additional 20mg  furosemide - not adding salt and has been reading food labels. Reviewed the importance of closely following a 2000mg  sodium diet and written dietary information was given to her about this - discussed getting updated echocardiogram but she would like to talk with Dr. Saralyn Pilar at her next appointment in November - saw cardiology Margarito Courser) 03/20/17 - BNP 06/26/18 was 1138.0  2: HTN- - BP looks good today - saw PCP Derrel Nip) 06/28/18 - BMP done 07/01/18 reviewed and showed sodium 138, potassium 4.4, creatinine 1.35. GFR done 06/26/18 was 37  3: COPD- - has inhalers that she uses - has a nebulizer but hasn't used it yet  4: Tobacco use- - smoking 8 cigarettes daily - complete cessation discussed for 3 minutes with her.  Patient did not bring her medications nor a list. Each medication was verbally reviewed with the patient and she was encouraged to bring the bottles to every visit to confirm accuracy of list.  Return in 1 month or sooner for any questions/problems before then.

## 2018-07-09 ENCOUNTER — Ambulatory Visit (INDEPENDENT_AMBULATORY_CARE_PROVIDER_SITE_OTHER): Payer: Medicare Other | Admitting: Gastroenterology

## 2018-07-09 ENCOUNTER — Encounter: Payer: Self-pay | Admitting: Internal Medicine

## 2018-07-09 ENCOUNTER — Encounter: Payer: Self-pay | Admitting: Gastroenterology

## 2018-07-09 VITALS — BP 90/60 | HR 80 | Ht 62.0 in | Wt 170.4 lb

## 2018-07-09 DIAGNOSIS — R195 Other fecal abnormalities: Secondary | ICD-10-CM

## 2018-07-09 DIAGNOSIS — D500 Iron deficiency anemia secondary to blood loss (chronic): Secondary | ICD-10-CM

## 2018-07-09 NOTE — Patient Instructions (Signed)
6 MONTH F/U

## 2018-07-09 NOTE — Progress Notes (Signed)
Ashley [name] [address]  [place]  [place], [place] [address]  Main: [phone]  Fax: [phone]   Gastroenterology Consultation  Referring Provider:     [name], MD Primary Care Physician:  [name], MD Primary Gastroenterologist:  Dr. Vonda [name] Reason for Consultation:     FOBT positive        HPI:    Chief Complaint  Patient presents with  . New Patient (Initial Visit)    referred by Dr. Megan Salon is a 67 y.o. y/o female referred for consultation & management  by Dr. [name], MD.   Patient with history of colon cancer in 2009, reportedly 5 to 6 inches of her colon removed, and no subsequent chemotherapy or radiation needed, As per patient.  Patient presents due to referral for positive FOBT on July 02, 2017.  Patient denies any hematochezia.  However, does report dark brown stool over the last 3 weeks, intermittently.  Denies any altered bowel habits, states sometimes bowel movements are formed, sometimes they are loose, but does not have multiple bowel movements a day.  No weight loss.  No dysphagia.  Intermittent heartburn.  CBC shows a recent downtrend in hemoglobin, currently at 10.22 weeks ago, and was 10.74 weeks ago, and was normal at 12, 2 months ago.  Patient denies any NSAID use or abdominal pain.  Last colonoscopy December 2017 by Dr. Orvil Feil with multiple polyps removed, showing a tubular adenomas, repeat recommended in 3 years. Patient reports history of EGD, with her 2009 colonoscopy.  2009 history of colon surgery, colonoscopy, EGD are not available in her chart or in care everywhere.  Past Medical History:  Diagnosis Date  . AICD (automatic cardioverter/defibrillator) present    on right side  . CAD (coronary artery disease)    s/p CABG  . CHF (congestive heart failure) ([place])   . Chronic kidney disease    renal artery stenosis  . Colon cancer ([place])   . COPD (chronic obstructive  pulmonary disease) ([place])   . Depression   . Dyspnea   . GERD (gastroesophageal reflux disease)   . Hx of colonic polyps   . Hyperlipidemia   . Hypertension   . Mitral valve disorder    s/p mitral valve repair wth CABG  . Myocardial infarction ([place])   . Peripheral vascular disease [facility])     Past Surgical History:  Procedure Laterality Date  . arm surgery     fracture, has plates and screws  . CABG with mitral valve repair    . COLON SURGERY     colon cancer  . COLONOSCOPY WITH PROPOFOL N/A 10/09/2016   Procedure: COLONOSCOPY WITH PROPOFOL;  Surgeon: Jonathon Bellows, MD;  Location: [facility] ENDOSCOPY;  Service: Endoscopy;  Laterality: N/A;  . CORONARY ARTERY BYPASS GRAFT    . pace maker defib  2016  . PERIPHERAL VASCULAR CATHETERIZATION N/A 10/23/2016   Procedure: Renal Angiography;  Surgeon: Algernon Huxley, MD;  Location: [place] [facility];  Service: Cardiovascular;  Laterality: N/A;  . TOTAL ABDOMINAL HYSTERECTOMY  1999   history of abnormal pap    Prior to Admission medications   Medication Sig Start Date End Date Taking? Authorizing Provider  acetaminophen (TYLENOL) 500 MG tablet Maximum 6 tablets a day. Patient taking differently: Take 500 mg by mouth every 4 (four) hours as needed for fever. Maximum 6 tablets a day. 02/09/15  Yes Doss, Velora Heckler, RN  ADVAIR  DISKUS 250-50 MCG/DOSE AEPB INHALE 1 PUFF BY MOUTH TWICE A DAY. RINSE MOUTH AFTER EACH USE 04/09/18  Yes [name], MD  albuterol (PROVENTIL HFA;VENTOLIN HFA) 108 (90 Base) MCG/ACT inhaler Inhale 2 puffs into the lungs every 6 (six) hours as needed for wheezing. 05/08/18  Yes [name], MD  ALPRAZolam Duanne Moron) 0.5 MG tablet TAKE ONE TABLET BY MOUTH AT BEDTIME AS NEEDED FOR ANXIETY Patient taking differently: Take 0.25-0.5 mg by mouth at bedtime. TAKE ONE TABLET BY MOUTH AT BEDTIME AS NEEDED FOR ANXIETY 05/08/18  Yes [name], MD  aspirin 81 MG tablet Take 81 mg by mouth daily.   Yes [provider]  atorvastatin  (LIPITOR) 40 MG tablet Take 1 tablet (40 mg total) by mouth daily. 05/08/18  Yes [name], MD  carvedilol (COREG) 6.25 MG tablet TAKE ONE TABLET BY MOUTH TWICE DAILY WITH MEAL 04/09/18  Yes [name], MD  clopidogrel (PLAVIX) 75 MG tablet TAKE ONE TABLET BY MOUTH EVERY DAY 04/09/18  Yes [name], MD  diphenhydrAMINE (DIPHENHIST) 25 mg capsule Take 25 mg by mouth at bedtime.   Yes [provider]  escitalopram (LEXAPRO) 10 MG tablet TAKE ONE TABLET BY MOUTH EVERY DAY 04/09/18  Yes [name], MD  furosemide (LASIX) 20 MG tablet TAKE ONE TABLET BY MOUTH EVERY DAY 04/09/18  Yes [name], MD  ipratropium-albuterol (DUONEB) 0.5-2.5 (3) MG/3ML SOLN Take 3 mLs by nebulization every 6 (six) hours as needed. 06/28/18  Yes [name], MD  lisinopril (PRINIVIL,ZESTRIL) 20 MG tablet TAKE ONE TABLET BY MOUTH EVERY DAY 04/09/18  Yes [name], MD  nitroGLYCERIN (NITROSTAT) 0.4 MG SL tablet Place 1 tablet (0.4 mg total) under the tongue every 5 (five) minutes as needed for chest pain. 06/19/16  Yes [name], MD  omeprazole (PRILOSEC) 40 MG capsule TAKE ONE CAPSULE BY MOUTH EVERY DAY 04/09/18  Yes [name], MD  SPIRIVA RESPIMAT 2.5 MCG/ACT AERS ACTIVATE AND INHALE TWO SPRAYS ORALLY INTO THE LUNGS ONCE DAILY. Patient taking differently: Inhale 1 puff into the lungs 2 (two) times daily.  01/09/18  Yes [name], MD  amLODipine (NORVASC) 5 MG tablet Take 5 mg by mouth daily.    [provider]    Family History  Problem Relation Age of Onset  . Arthritis Mother   . Cancer Mother        uterus cancer  . Hyperlipidemia Mother   . Hypertension Mother   . Heart disease Mother   . Diabetes Mother   . Hyperlipidemia Father   . Hypertension Father   . Heart disease Father   . Diabetes Father   . Cancer Sister        ovary cancer  . Diabetes Maternal Grandmother   . Hypertension Maternal Grandmother   . Arthritis Maternal Grandmother   .  Hypertension Maternal Grandfather   . Hypertension Paternal Grandmother   . Hypertension Paternal Grandfather   . Heart disease Paternal Grandfather   . Breast cancer Neg Hx      Social History   Tobacco Use  . Smoking status: Current Every Day Smoker    Packs/day: 0.50    Types: Cigarettes  . Smokeless tobacco: Never Used  . Tobacco comment: 1/2-1 ppd  Substance Use Topics  . Alcohol use: Yes    Alcohol/week: 2.0 standard drinks    Types: 2 Shots of liquor per week  . Drug use: No  Allergies as of 07/09/2018 - Review Complete 07/09/2018  Allergen Reaction Noted  . Hydrocodone-acetaminophen Nausea Only 08/25/2014    Review of Systems:    All systems reviewed and negative except where noted in HPI.   Physical Exam:  BP 90/60   Pulse 80   Ht 5\' 2"  (1.575 m)   Wt 170 lb 6.4 oz (77.3 kg)   BMI 31.17 kg/m  No LMP recorded. Patient has had a hysterectomy. Psych:  Alert and cooperative. Normal mood and affect. General:   Alert,  Well-developed, well-nourished, pleasant and cooperative in NAD Head:  Normocephalic and atraumatic. Eyes:  Sclera clear, no icterus.   Conjunctiva pink. Ears:  Normal auditory acuity. Nose:  No deformity, discharge, or lesions. Mouth:  No deformity or lesions,oropharynx pink & moist. Neck:  Supple; no masses or thyromegaly. Lungs:  Respirations even and unlabored.  Clear throughout to auscultation.   No wheezes, crackles, or rhonchi. No acute distress. Heart:  Regular rate and rhythm; no murmurs, clicks, rubs, or gallops. Abdomen:  Normal bowel sounds.  No bruits.  Soft, non-tender and non-distended without masses, hepatosplenomegaly or hernias noted.  No guarding or rebound tenderness.    Msk:  Symmetrical without gross deformities. Good, equal movement & strength bilaterally. Pulses:  Normal pulses noted. Extremities:  No clubbing or edema.  No cyanosis. Neurologic:  Alert and oriented x3;  grossly normal neurologically. Skin:  Intact  without significant lesions or rashes. No jaundice. Lymph Nodes:  No significant cervical adenopathy. Psych:  Alert and cooperative. Normal mood and affect.   Labs: CBC    Component Value Date/Time   WBC 6.2 06/26/2018 0829   RBC 3.44 (L) 06/26/2018 0829   HGB 10.2 (L) 06/26/2018 0829   HGB 12.8 08/20/2014 0449   HCT 31.0 (L) 06/26/2018 0829   HCT 37.3 08/20/2014 0449   PLT 273 06/26/2018 0829   PLT 278 08/20/2014 0449   MCV 90.0 06/26/2018 0829   MCV 93 08/20/2014 0449   MCH 29.7 06/26/2018 0829   MCHC 33.0 06/26/2018 0829   RDW 16.7 (H) 06/26/2018 0829   RDW 13.5 08/20/2014 0449   LYMPHSABS 1.3 06/26/2018 0829   LYMPHSABS 2.1 08/20/2014 0449   MONOABS 0.5 06/26/2018 0829   MONOABS 1.1 (H) 08/20/2014 0449   EOSABS 0.1 06/26/2018 0829   EOSABS 0.2 08/20/2014 0449   BASOSABS 0.1 06/26/2018 0829   BASOSABS 0.1 08/20/2014 0449   CMP     Component Value Date/Time   NA 138 07/01/2018 1401   NA 141 10/27/2014 1304   K 4.4 07/01/2018 1401   K 4.0 10/27/2014 1304   CL 103 07/01/2018 1401   CL 108 (H) 10/27/2014 1304   CO2 26 07/01/2018 1401   CO2 27 10/27/2014 1304   GLUCOSE 152 (H) 07/01/2018 1401   GLUCOSE 93 10/27/2014 1304   BUN 22 07/01/2018 1401   BUN 12 10/27/2014 1304   CREATININE 1.35 (H) 07/01/2018 1401   CALCIUM 9.1 07/01/2018 1401   CALCIUM 9.0 10/27/2014 1304   PROT 6.5 07/01/2018 1401   PROT 6.8 08/18/2014 2233   ALBUMIN 3.8 05/29/2018 0008   ALBUMIN 3.3 (L) 08/18/2014 2233   AST 14 07/01/2018 1401   AST 83 (H) 08/18/2014 2233   ALT 26 07/01/2018 1401   ALT 65 (H) 08/18/2014 2233   ALKPHOS 77 05/29/2018 0008   ALKPHOS 122 (H) 08/18/2014 2233   BILITOT 0.7 07/01/2018 1401   BILITOT 0.4 08/18/2014 2233   GFRNONAA 37 (L) 06/26/2018 9622  GFRNONAA >60 10/27/2014 1304   GFRNONAA 57 (L) 05/30/2013 0038   GFRAA 43 (L) 06/26/2018 0829   GFRAA >60 10/27/2014 1304   GFRAA >60 05/30/2013 0038    Imaging Studies: Dg Chest Port 1 View  Result Date:  06/26/2018 CLINICAL DATA:  Shortness of breath EXAM: PORTABLE CHEST 1 VIEW COMPARISON:  06/06/2016 FINDINGS: Right AICD remains in place, unchanged. Cardiomegaly. Prior CABG. Vascular congestion and interstitial prominence may reflect interstitial edema or chronic interstitial lung disease. Bibasilar atelectasis or scarring. IMPRESSION: Cardiomegaly with vascular congestion. Diffuse interstitial prominence could reflect interstitial edema or chronic lung disease. Bibasilar atelectasis or scarring. Electronically Signed   By: Rolm Baptise M.D.   On: 06/26/2018 08:44    Assessment and Plan:   Ashley [name] is a 67 y.o. y/o female has been referred for positive FOBT  Positive FOBT, anemia, low normal ferritin for all indication for endoscopic evaluation at this time We will schedule for EGD and colonoscopy for iron deficiency anemia Likelihood of new colonic malignancy is low, given colonoscopy December 2017 Small bowel capsule also indicated he EGD and colonoscopy are negative Continue to avoid NSAIDs  Primary care provider can consider referral to hematology based on Repeat labs, if indicated  I have discussed alternative options, risks & benefits,  which include, but are not limited to, bleeding, infection, perforation,respiratory complication & drug reaction.  The patient agrees with this plan & written consent will be obtained.    Patient will need clearance for holding her Plavix  Dr Ashley [name]

## 2018-07-09 NOTE — Addendum Note (Signed)
Addended by: Earl Lagos on: 07/09/2018 12:48 PM   Modules accepted: Orders, SmartSet

## 2018-07-12 ENCOUNTER — Ambulatory Visit: Payer: Medicare Other | Admitting: Family

## 2018-07-12 ENCOUNTER — Telehealth: Payer: Self-pay | Admitting: *Deleted

## 2018-07-12 NOTE — Telephone Encounter (Signed)
Copied from [place] [id number]. Topic: General - Other >> Jul 12, 2018  1:15 PM Judyann Munson wrote: Reason for CRM:  Pharmacy is calling to advise they are needing a pre authorization for ipratropium-albuterol (DUONEB) 0.5-2.5 (3) MG/3ML SOLN. Please advise

## 2018-07-12 NOTE — Telephone Encounter (Signed)
PA has been submitted to covermymeds.

## 2018-07-17 ENCOUNTER — Encounter: Payer: Self-pay | Admitting: Emergency Medicine

## 2018-07-17 ENCOUNTER — Inpatient Hospital Stay
Admission: EM | Admit: 2018-07-17 | Discharge: 2018-07-19 | DRG: 291 | Disposition: A | Discharge status: Home / Self Care | Payer: Medicare Other | Attending: Internal Medicine | Admitting: Internal Medicine

## 2018-07-17 ENCOUNTER — Telehealth: Payer: Self-pay | Admitting: Internal Medicine

## 2018-07-17 ENCOUNTER — Emergency Department: Payer: Medicare Other

## 2018-07-17 ENCOUNTER — Other Ambulatory Visit: Payer: Self-pay

## 2018-07-17 DIAGNOSIS — I509 Heart failure, unspecified: Secondary | ICD-10-CM | POA: Diagnosis not present

## 2018-07-17 DIAGNOSIS — F419 Anxiety disorder, unspecified: Secondary | ICD-10-CM | POA: Diagnosis present

## 2018-07-17 DIAGNOSIS — J9601 Acute respiratory failure with hypoxia: Secondary | ICD-10-CM | POA: Diagnosis not present

## 2018-07-17 DIAGNOSIS — F17200 Nicotine dependence, unspecified, uncomplicated: Secondary | ICD-10-CM | POA: Diagnosis not present

## 2018-07-17 DIAGNOSIS — Z95810 Presence of automatic (implantable) cardiac defibrillator: Secondary | ICD-10-CM

## 2018-07-17 DIAGNOSIS — I5033 Acute on chronic diastolic (congestive) heart failure: Secondary | ICD-10-CM | POA: Diagnosis not present

## 2018-07-17 DIAGNOSIS — J441 Chronic obstructive pulmonary disease with (acute) exacerbation: Secondary | ICD-10-CM

## 2018-07-17 DIAGNOSIS — Z8049 Family history of malignant neoplasm of other genital organs: Secondary | ICD-10-CM

## 2018-07-17 DIAGNOSIS — D638 Anemia in other chronic diseases classified elsewhere: Secondary | ICD-10-CM | POA: Diagnosis present

## 2018-07-17 DIAGNOSIS — Z86010 Personal history of colonic polyps: Secondary | ICD-10-CM

## 2018-07-17 DIAGNOSIS — E782 Mixed hyperlipidemia: Secondary | ICD-10-CM | POA: Diagnosis present

## 2018-07-17 DIAGNOSIS — I429 Cardiomyopathy, unspecified: Secondary | ICD-10-CM | POA: Diagnosis present

## 2018-07-17 DIAGNOSIS — I248 Other forms of acute ischemic heart disease: Secondary | ICD-10-CM | POA: Diagnosis present

## 2018-07-17 DIAGNOSIS — Z952 Presence of prosthetic heart valve: Secondary | ICD-10-CM

## 2018-07-17 DIAGNOSIS — Z90710 Acquired absence of both cervix and uterus: Secondary | ICD-10-CM

## 2018-07-17 DIAGNOSIS — N183 Chronic kidney disease, stage 3 (moderate): Secondary | ICD-10-CM | POA: Diagnosis present

## 2018-07-17 DIAGNOSIS — F329 Major depressive disorder, single episode, unspecified: Secondary | ICD-10-CM | POA: Diagnosis present

## 2018-07-17 DIAGNOSIS — K219 Gastro-esophageal reflux disease without esophagitis: Secondary | ICD-10-CM | POA: Diagnosis present

## 2018-07-17 DIAGNOSIS — Z8261 Family history of arthritis: Secondary | ICD-10-CM

## 2018-07-17 DIAGNOSIS — I252 Old myocardial infarction: Secondary | ICD-10-CM | POA: Diagnosis not present

## 2018-07-17 DIAGNOSIS — I059 Rheumatic mitral valve disease, unspecified: Secondary | ICD-10-CM | POA: Diagnosis present

## 2018-07-17 DIAGNOSIS — I25798 Atherosclerosis of other coronary artery bypass graft(s) with other forms of angina pectoris: Secondary | ICD-10-CM | POA: Diagnosis not present

## 2018-07-17 DIAGNOSIS — Z8249 Family history of ischemic heart disease and other diseases of the circulatory system: Secondary | ICD-10-CM | POA: Diagnosis not present

## 2018-07-17 DIAGNOSIS — I701 Atherosclerosis of renal artery: Secondary | ICD-10-CM | POA: Diagnosis present

## 2018-07-17 DIAGNOSIS — Z7982 Long term (current) use of aspirin: Secondary | ICD-10-CM

## 2018-07-17 DIAGNOSIS — I739 Peripheral vascular disease, unspecified: Secondary | ICD-10-CM | POA: Diagnosis present

## 2018-07-17 DIAGNOSIS — R7989 Other specified abnormal findings of blood chemistry: Secondary | ICD-10-CM | POA: Diagnosis not present

## 2018-07-17 DIAGNOSIS — I130 Hypertensive heart and chronic kidney disease with heart failure and stage 1 through stage 4 chronic kidney disease, or unspecified chronic kidney disease: Principal | ICD-10-CM | POA: Diagnosis present

## 2018-07-17 DIAGNOSIS — Z8349 Family history of other endocrine, nutritional and metabolic diseases: Secondary | ICD-10-CM | POA: Diagnosis not present

## 2018-07-17 DIAGNOSIS — Z720 Tobacco use: Secondary | ICD-10-CM | POA: Diagnosis not present

## 2018-07-17 DIAGNOSIS — Z8041 Family history of malignant neoplasm of ovary: Secondary | ICD-10-CM

## 2018-07-17 DIAGNOSIS — I2510 Atherosclerotic heart disease of native coronary artery without angina pectoris: Secondary | ICD-10-CM | POA: Diagnosis present

## 2018-07-17 DIAGNOSIS — Z833 Family history of diabetes mellitus: Secondary | ICD-10-CM

## 2018-07-17 DIAGNOSIS — Z7902 Long term (current) use of antithrombotics/antiplatelets: Secondary | ICD-10-CM

## 2018-07-17 DIAGNOSIS — I5043 Acute on chronic combined systolic (congestive) and diastolic (congestive) heart failure: Secondary | ICD-10-CM | POA: Diagnosis present

## 2018-07-17 DIAGNOSIS — F17210 Nicotine dependence, cigarettes, uncomplicated: Secondary | ICD-10-CM | POA: Diagnosis present

## 2018-07-17 DIAGNOSIS — I10 Essential (primary) hypertension: Secondary | ICD-10-CM | POA: Diagnosis not present

## 2018-07-17 DIAGNOSIS — R0602 Shortness of breath: Secondary | ICD-10-CM | POA: Diagnosis not present

## 2018-07-17 DIAGNOSIS — Z85038 Personal history of other malignant neoplasm of large intestine: Secondary | ICD-10-CM

## 2018-07-17 DIAGNOSIS — Z951 Presence of aortocoronary bypass graft: Secondary | ICD-10-CM

## 2018-07-17 DIAGNOSIS — I110 Hypertensive heart disease with heart failure: Secondary | ICD-10-CM | POA: Diagnosis not present

## 2018-07-17 DIAGNOSIS — J811 Chronic pulmonary edema: Secondary | ICD-10-CM | POA: Diagnosis not present

## 2018-07-17 DIAGNOSIS — Z885 Allergy status to narcotic agent status: Secondary | ICD-10-CM

## 2018-07-17 LAB — CBC WITH DIFFERENTIAL/PLATELET
Basophils Absolute: 0.1 10*3/uL (ref 0–0.1)
Basophils Relative: 1 %
EOS PCT: 2 %
Eosinophils Absolute: 0.2 10*3/uL (ref 0–0.7)
HEMATOCRIT: 26.9 % — AB (ref 35.0–47.0)
Hemoglobin: 8.9 g/dL — ABNORMAL LOW (ref 12.0–16.0)
LYMPHS PCT: 12 %
Lymphs Abs: 1.1 10*3/uL (ref 1.0–3.6)
MCH: 29.5 pg (ref 26.0–34.0)
MCHC: 33.2 g/dL (ref 32.0–36.0)
MCV: 88.7 fL (ref 80.0–100.0)
MONO ABS: 0.7 10*3/uL (ref 0.2–0.9)
MONOS PCT: 7 %
NEUTROS ABS: 7.3 10*3/uL — AB (ref 1.4–6.5)
Neutrophils Relative %: 78 %
PLATELETS: 331 10*3/uL (ref 150–440)
RBC: 3.04 MIL/uL — ABNORMAL LOW (ref 3.80–5.20)
RDW: 17.5 % — AB (ref 11.5–14.5)
WBC: 9.4 10*3/uL (ref 3.6–11.0)

## 2018-07-17 LAB — COMPREHENSIVE METABOLIC PANEL
ALT: 57 U/L — ABNORMAL HIGH (ref 0–44)
ANION GAP: 11 (ref 5–15)
AST: 90 U/L — ABNORMAL HIGH (ref 15–41)
Albumin: 3.5 g/dL (ref 3.5–5.0)
Alkaline Phosphatase: 98 U/L (ref 38–126)
BILIRUBIN TOTAL: 0.8 mg/dL (ref 0.3–1.2)
BUN: 16 mg/dL (ref 8–23)
CALCIUM: 8.4 mg/dL — AB (ref 8.9–10.3)
CHLORIDE: 109 mmol/L (ref 98–111)
CO2: 19 mmol/L — ABNORMAL LOW (ref 22–32)
CREATININE: 1.23 mg/dL — AB (ref 0.44–1.00)
GFR, EST AFRICAN AMERICAN: 52 mL/min — AB (ref >60)
GFR, EST NON AFRICAN AMERICAN: 45 mL/min — AB (ref >60)
Glucose, Bld: 343 mg/dL — ABNORMAL HIGH (ref 70–99)
POTASSIUM: 4.3 mmol/L (ref 3.5–5.1)
Sodium: 139 mmol/L (ref 135–145)
TOTAL PROTEIN: 6.3 g/dL — AB (ref 6.5–8.1)

## 2018-07-17 LAB — BRAIN NATRIURETIC PEPTIDE: B Natriuretic Peptide: 1730 pg/mL — ABNORMAL HIGH (ref 0.0–100.0)

## 2018-07-17 LAB — TSH: TSH: 1.791 u[IU]/mL (ref 0.350–4.500)

## 2018-07-17 LAB — TROPONIN I
Troponin I: 0.03 ng/mL (ref <0.03)
Troponin I: 0.05 ng/mL (ref <0.03)
Troponin I: 0.06 ng/mL (ref <0.03)

## 2018-07-17 LAB — GLUCOSE, CAPILLARY: GLUCOSE-CAPILLARY: 309 mg/dL — AB (ref 70–99)

## 2018-07-17 MED ORDER — FUROSEMIDE 10 MG/ML IJ SOLN
40.0000 mg | Freq: Once | INTRAMUSCULAR | Status: AC
  Administered 2018-07-17: 40 mg via INTRAVENOUS
  Filled 2018-07-17: qty 4

## 2018-07-17 MED ORDER — ALBUTEROL SULFATE (2.5 MG/3ML) 0.083% IN NEBU
2.5000 mg | INHALATION_SOLUTION | Freq: Four times a day (QID) | RESPIRATORY_TRACT | Status: DC | PRN
  Filled 2018-07-17: qty 3

## 2018-07-17 MED ORDER — MOMETASONE FURO-FORMOTEROL FUM 200-5 MCG/ACT IN AERO
2.0000 | INHALATION_SPRAY | Freq: Two times a day (BID) | RESPIRATORY_TRACT | Status: DC
  Administered 2018-07-17 – 2018-07-19 (×4): 2 via RESPIRATORY_TRACT
  Filled 2018-07-17: qty 8.8

## 2018-07-17 MED ORDER — OXYCODONE HCL 5 MG PO TABS: 5.0000 mg | ORAL_TABLET | ORAL | Status: DC | PRN

## 2018-07-17 MED ORDER — ENOXAPARIN SODIUM 40 MG/0.4ML ~~LOC~~ SOLN
40.0000 mg | SUBCUTANEOUS | Status: DC
  Administered 2018-07-17 – 2018-07-18 (×2): 40 mg via SUBCUTANEOUS
  Filled 2018-07-17 (×2): qty 0.4

## 2018-07-17 MED ORDER — ALBUTEROL (5 MG/ML) CONTINUOUS INHALATION SOLN
15.0000 mg/h | INHALATION_SOLUTION | Freq: Once | RESPIRATORY_TRACT | Status: DC
  Filled 2018-07-17: qty 20

## 2018-07-17 MED ORDER — ASPIRIN EC 81 MG PO TBEC
81.0000 mg | DELAYED_RELEASE_TABLET | Freq: Every day | ORAL | Status: DC
  Administered 2018-07-17 – 2018-07-19 (×2): 81 mg via ORAL
  Filled 2018-07-17 (×2): qty 1

## 2018-07-17 MED ORDER — ALBUTEROL SULFATE HFA 108 (90 BASE) MCG/ACT IN AERS: 2.0000 | INHALATION_SPRAY | Freq: Four times a day (QID) | RESPIRATORY_TRACT | Status: DC | PRN

## 2018-07-17 MED ORDER — BUDESONIDE 0.25 MG/2ML IN SUSP
0.2500 mg | Freq: Two times a day (BID) | RESPIRATORY_TRACT | Status: DC
  Administered 2018-07-17 – 2018-07-19 (×4): 0.25 mg via RESPIRATORY_TRACT
  Filled 2018-07-17 (×4): qty 2

## 2018-07-17 MED ORDER — ACETAMINOPHEN 650 MG RE SUPP: 650.0000 mg | Freq: Four times a day (QID) | RECTAL | Status: DC | PRN

## 2018-07-17 MED ORDER — IPRATROPIUM-ALBUTEROL 0.5-2.5 (3) MG/3ML IN SOLN
3.0000 mL | Freq: Four times a day (QID) | RESPIRATORY_TRACT | Status: DC
  Administered 2018-07-17 – 2018-07-19 (×7): 3 mL via RESPIRATORY_TRACT
  Filled 2018-07-17 (×7): qty 3

## 2018-07-17 MED ORDER — SODIUM CHLORIDE 0.9 % IV SOLN: 250.0000 mL | INTRAVENOUS | Status: DC | PRN

## 2018-07-17 MED ORDER — PANTOPRAZOLE SODIUM 40 MG PO TBEC
40.0000 mg | DELAYED_RELEASE_TABLET | Freq: Every day | ORAL | Status: DC
  Administered 2018-07-17 – 2018-07-19 (×3): 40 mg via ORAL
  Filled 2018-07-17 (×3): qty 1

## 2018-07-17 MED ORDER — ATORVASTATIN CALCIUM 20 MG PO TABS
40.0000 mg | ORAL_TABLET | Freq: Every day | ORAL | Status: DC
  Administered 2018-07-17 – 2018-07-19 (×3): 40 mg via ORAL
  Filled 2018-07-17 (×3): qty 2

## 2018-07-17 MED ORDER — CLOPIDOGREL BISULFATE 75 MG PO TABS
75.0000 mg | ORAL_TABLET | Freq: Every day | ORAL | Status: DC
  Administered 2018-07-17: 75 mg via ORAL
  Filled 2018-07-17: qty 1

## 2018-07-17 MED ORDER — ALPRAZOLAM 0.5 MG PO TABS
0.2500 mg | ORAL_TABLET | Freq: Every day | ORAL | Status: DC
  Administered 2018-07-17 – 2018-07-18 (×2): 0.5 mg via ORAL
  Filled 2018-07-17 (×2): qty 1

## 2018-07-17 MED ORDER — SODIUM CHLORIDE 0.9% FLUSH
3.0000 mL | INTRAVENOUS | Status: DC | PRN
  Administered 2018-07-19 (×2): 3 mL via INTRAVENOUS
  Filled 2018-07-17 (×2): qty 3

## 2018-07-17 MED ORDER — NICOTINE 21 MG/24HR TD PT24
21.0000 mg | MEDICATED_PATCH | Freq: Every day | TRANSDERMAL | Status: DC
  Administered 2018-07-17 – 2018-07-19 (×3): 21 mg via TRANSDERMAL
  Filled 2018-07-17 (×3): qty 1

## 2018-07-17 MED ORDER — AMLODIPINE BESYLATE 5 MG PO TABS
5.0000 mg | ORAL_TABLET | Freq: Every day | ORAL | Status: DC
  Administered 2018-07-17 – 2018-07-19 (×3): 5 mg via ORAL
  Filled 2018-07-17 (×3): qty 1

## 2018-07-17 MED ORDER — SODIUM CHLORIDE 0.9% FLUSH
3.0000 mL | Freq: Two times a day (BID) | INTRAVENOUS | Status: DC
  Administered 2018-07-17 – 2018-07-19 (×5): 3 mL via INTRAVENOUS

## 2018-07-17 MED ORDER — DIPHENHYDRAMINE HCL 25 MG PO CAPS
25.0000 mg | ORAL_CAPSULE | Freq: Every day | ORAL | Status: DC
  Administered 2018-07-17 – 2018-07-18 (×2): 25 mg via ORAL
  Filled 2018-07-17 (×2): qty 1

## 2018-07-17 MED ORDER — LISINOPRIL 20 MG PO TABS
20.0000 mg | ORAL_TABLET | Freq: Every day | ORAL | Status: DC
  Administered 2018-07-17 – 2018-07-19 (×3): 20 mg via ORAL
  Filled 2018-07-17 (×2): qty 1
  Filled 2018-07-17: qty 2

## 2018-07-17 MED ORDER — FUROSEMIDE 10 MG/ML IJ SOLN
40.0000 mg | Freq: Two times a day (BID) | INTRAMUSCULAR | Status: DC
  Administered 2018-07-17 – 2018-07-19 (×4): 40 mg via INTRAVENOUS
  Filled 2018-07-17 (×4): qty 4

## 2018-07-17 MED ORDER — NITROGLYCERIN 0.4 MG SL SUBL: 0.4000 mg | SUBLINGUAL_TABLET | SUBLINGUAL | Status: DC | PRN

## 2018-07-17 MED ORDER — ONDANSETRON HCL 4 MG/2ML IJ SOLN: 4.0000 mg | Freq: Four times a day (QID) | INTRAMUSCULAR | Status: DC | PRN

## 2018-07-17 MED ORDER — IPRATROPIUM-ALBUTEROL 0.5-2.5 (3) MG/3ML IN SOLN
3.0000 mL | RESPIRATORY_TRACT | Status: DC
  Administered 2018-07-17: 3 mL via RESPIRATORY_TRACT
  Filled 2018-07-17: qty 3

## 2018-07-17 MED ORDER — ORAL CARE MOUTH RINSE
15.0000 mL | Freq: Two times a day (BID) | OROMUCOSAL | Status: DC
  Administered 2018-07-17 – 2018-07-19 (×3): 15 mL via OROMUCOSAL

## 2018-07-17 MED ORDER — ONDANSETRON HCL 4 MG PO TABS: 4.0000 mg | ORAL_TABLET | Freq: Four times a day (QID) | ORAL | Status: DC | PRN

## 2018-07-17 MED ORDER — ALBUTEROL SULFATE (2.5 MG/3ML) 0.083% IN NEBU
INHALATION_SOLUTION | RESPIRATORY_TRACT | Status: AC
  Administered 2018-07-17: 11:00:00
  Filled 2018-07-17: qty 3

## 2018-07-17 MED ORDER — ACETAMINOPHEN 325 MG PO TABS: 650.0000 mg | ORAL_TABLET | Freq: Four times a day (QID) | ORAL | Status: DC | PRN

## 2018-07-17 MED ORDER — ESCITALOPRAM OXALATE 10 MG PO TABS
10.0000 mg | ORAL_TABLET | Freq: Every day | ORAL | Status: DC
  Administered 2018-07-18 – 2018-07-19 (×2): 10 mg via ORAL
  Filled 2018-07-17 (×3): qty 1

## 2018-07-17 MED ORDER — CARVEDILOL 6.25 MG PO TABS
6.2500 mg | ORAL_TABLET | Freq: Two times a day (BID) | ORAL | Status: DC
  Administered 2018-07-17 – 2018-07-19 (×4): 6.25 mg via ORAL
  Filled 2018-07-17 (×4): qty 1

## 2018-07-17 MED ORDER — ALBUTEROL SULFATE (2.5 MG/3ML) 0.083% IN NEBU
INHALATION_SOLUTION | RESPIRATORY_TRACT | Status: AC
  Administered 2018-07-17: 2.5 mg
  Filled 2018-07-17: qty 15

## 2018-07-17 NOTE — Progress Notes (Signed)
Inpatient Diabetes Program Recommendations  AACE/ADA: New Consensus Statement on Inpatient Glycemic Control (2019)  Target Ranges:  Prepandial:   less than 140 mg/dL      Peak postprandial:   less than 180 mg/dL (1-2 hours)      Critically ill patients:  140 - 180 mg/dL   Results for [facility]" (MRN [id number]) as of 07/17/2018 13:10  Ref. Range 07/17/2018 09:14  Glucose Latest Ref Range: 70 - 99 mg/dL 343 (H)  Results for INGE, WALDROUP A "DEBBIE" (MRN [id number]) as of 07/17/2018 13:10  Ref. Range 05/06/2018 11:11  Hemoglobin A1C Latest Ref Range: 4.6 - 6.5 % 6.2    Review of Glycemic Control  Diabetes history: NO Outpatient Diabetes medications: NA Current orders for Inpatient glycemic control: None  Inpatient Diabetes Program Recommendations: Correction (SSI): Please consider ordering CBGs with Novolog 0-9 units TID with meals and Novolog 0-5 units QHS.  NOTE: No DM hx noted in chart. Per ED Triage note by P. Haugh, RN patient was given Solumedrol 125 mg en route to hospital by [facility]. Anticipate steroids likely cause of hyperglycemia.   Thanks, Barnie Alderman, RN, MSN, CDE Diabetes Coordinator Inpatient Diabetes Program [phone] (Team Pager from 8am to 5pm)

## 2018-07-17 NOTE — ED Triage Notes (Signed)
Pt arrives via [facility] from home in respiratory distress. Per patient, symptoms began this AM and got progressively worse. Per EMS, room air O2 saturation was low 80s. No improvement on NRB. Placed on CPAP and came up to 99-100%. Pt received 2 duonebs, 2g mag & 125 mg solu-medrol en route.

## 2018-07-17 NOTE — ED Notes (Signed)
Freda, RRT, en route to ER to try pt on high flow nasal cannula. Pt tolerating 10L NRB well at this time.

## 2018-07-17 NOTE — ED Provider Notes (Signed)
[facility] EMERGENCY DEPARTMENT Provider Note   CSN: [id number] Arrival date & time: 07/17/18  0908     History   Chief Complaint Chief Complaint  Patient presents with  . Respiratory Distress    HPI Ashley [name] is a 67 y.o. female history of CAD, CHF status post AICD, COPD here presenting with shortness of breath.  Patient states that she woke up this morning from acute onset of shortness of breath.  She states that she was fine when she went to bed last night.  Patient states that she had a hard time catching her breath this morning.  Denies any fevers or chills or cough.  Denies any leg swelling or weight gain.  Patient was seen in the ED about 2 weeks ago for similar symptoms.  At that time she was diagnosed with COPD exacerbation and finished a course of steroids.  Patient still smokes cigarettes daily.  Patient also was noted to be hypoxic about 80% on room air per EMS and she is not on oxygen at baseline.  Patient was put on CPAP by EMS.  The history is provided by the patient.    Past Medical History:  Diagnosis Date  . AICD (automatic cardioverter/defibrillator) present    on right side  . CAD (coronary artery disease)    s/p CABG  . CHF (congestive heart failure) ([place])   . Chronic kidney disease    renal artery stenosis  . Colon cancer ([place])   . COPD (chronic obstructive pulmonary disease) ([place])   . Depression   . Dyspnea   . GERD (gastroesophageal reflux disease)   . Hx of colonic polyps   . Hyperlipidemia   . Hypertension   . Mitral valve disorder    s/p mitral valve repair wth CABG  . Myocardial infarction ([place])   . Peripheral vascular disease [facility])     Patient Active Problem List   Diagnosis Date Noted  . Chronic diastolic heart failure ([place]) 07/03/2018  . HTN (hypertension) 07/03/2018  . 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 Anemia 06/28/2018  . Hypotension 05/11/2018  . Prediabetes 05/11/2018  . Personal history of  colon cancer   . Benign neoplasm of ascending colon   . Benign neoplasm of descending colon   . Polyp of sigmoid colon   . Benign neoplasm of transverse colon   . Diverticulosis of large intestine without diverticulitis   . 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 Renal artery stenosis ([place]) 10/03/2016  . 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 Hospital discharge follow-up 02/09/2015  . Tobacco abuse 10/11/2014  . Tobacco abuse counseling 10/11/2014  . Chronic right hip pain 08/26/2014  . Atherosclerosis of native artery of extremity with intermittent claudication ([place]) 06/18/2013  . Routine general medical examination at a health care facility 06/18/2013  . CAD (coronary artery disease) 06/01/2013  . GERD (gastroesophageal reflux disease) 06/01/2013  . Hypercholesterolemia 06/01/2013  . Tubular adenoma of colon 06/01/2013  . 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 arm surgery     fracture, has plates and screws  . CABG with mitral valve repair    . COLON SURGERY     colon cancer  . COLONOSCOPY WITH PROPOFOL N/A 10/09/2016   Procedure: COLONOSCOPY WITH PROPOFOL;  Surgeon: Jonathon Bellows, MD;  Location: [facility] ENDOSCOPY;  Service: Endoscopy;  Laterality: N/A;  . CORONARY ARTERY BYPASS GRAFT    . pace  maker defib  2016  . PERIPHERAL VASCULAR CATHETERIZATION N/A 10/23/2016   Procedure: Renal Angiography;  Surgeon: Algernon Huxley, MD;  Location: [facility] CV LAB;  Service: Cardiovascular;  Laterality: N/A;  . TOTAL ABDOMINAL HYSTERECTOMY  1999   history of abnormal pap     OB History   None      Home Medications    Prior to Admission medications   Medication Sig Start Date End Date Taking? Authorizing Provider  acetaminophen (TYLENOL) 500 MG tablet Maximum 6 tablets a day. Patient taking differently: Take 500 mg by mouth every 4 (four) hours as needed for fever. Maximum 6  tablets a day. 02/09/15   Rubbie Battiest, RN  ADVAIR DISKUS 250-50 MCG/DOSE AEPB INHALE 1 PUFF BY MOUTH TWICE A DAY. RINSE MOUTH AFTER EACH USE 04/09/18   [name], MD  albuterol (PROVENTIL HFA;VENTOLIN HFA) 108 (90 Base) MCG/ACT inhaler Inhale 2 puffs into the lungs every 6 (six) hours as needed for wheezing. 05/08/18   [name], MD  ALPRAZolam Duanne Moron) 0.5 MG tablet TAKE ONE TABLET BY MOUTH AT BEDTIME AS NEEDED FOR ANXIETY Patient taking differently: Take 0.25-0.5 mg by mouth at bedtime. TAKE ONE TABLET BY MOUTH AT BEDTIME AS NEEDED FOR ANXIETY 05/08/18   [name], MD  amLODipine (NORVASC) 5 MG tablet Take 5 mg by mouth daily.    [provider]  aspirin 81 MG tablet Take 81 mg by mouth daily.    [provider]  atorvastatin (LIPITOR) 40 MG tablet Take 1 tablet (40 mg total) by mouth daily. 05/08/18   [name], MD  carvedilol (COREG) 6.25 MG tablet TAKE ONE TABLET BY MOUTH TWICE DAILY WITH MEAL 04/09/18   [name], MD  clopidogrel (PLAVIX) 75 MG tablet TAKE ONE TABLET BY MOUTH EVERY DAY 04/09/18   [name], MD  diphenhydrAMINE (DIPHENHIST) 25 mg capsule Take 25 mg by mouth at bedtime.    [provider]  escitalopram (LEXAPRO) 10 MG tablet TAKE ONE TABLET BY MOUTH EVERY DAY 04/09/18   [name], MD  furosemide (LASIX) 20 MG tablet TAKE ONE TABLET BY MOUTH EVERY DAY 04/09/18   [name], MD  ipratropium-albuterol (DUONEB) 0.5-2.5 (3) MG/3ML SOLN Take 3 mLs by nebulization every 6 (six) hours as needed. 06/28/18   [name], MD  lisinopril (PRINIVIL,ZESTRIL) 20 MG tablet TAKE ONE TABLET BY MOUTH EVERY DAY 04/09/18   [name], MD  nitroGLYCERIN (NITROSTAT) 0.4 MG SL tablet Place 1 tablet (0.4 mg total) under the tongue every 5 (five) minutes as needed for chest pain. 06/19/16   [name], MD  omeprazole (PRILOSEC) 40 MG capsule TAKE ONE CAPSULE BY MOUTH EVERY DAY 04/09/18   [name], MD  SPIRIVA RESPIMAT 2.5 MCG/ACT  AERS ACTIVATE AND INHALE TWO SPRAYS ORALLY INTO THE LUNGS ONCE DAILY. Patient taking differently: Inhale 1 puff into the lungs 2 (two) times daily.  01/09/18   [name], MD    Family History Family History  Problem Relation Age of Onset  . Arthritis Mother   . Cancer Mother        uterus cancer  . Hyperlipidemia Mother   . Hypertension Mother   . Heart disease Mother   . Diabetes Mother   . Hyperlipidemia Father   . Hypertension Father   . Heart disease Father   . Diabetes Father   . Cancer Sister  ovary cancer  . Diabetes Maternal Grandmother   . Hypertension Maternal Grandmother   . Arthritis Maternal Grandmother   . Hypertension Maternal Grandfather   . Hypertension Paternal Grandmother   . Hypertension Paternal Grandfather   . Heart disease Paternal Grandfather   . Breast cancer Neg Hx     Social History Social History   Tobacco Use  . Smoking status: Current Every Day Smoker    Packs/day: 0.50    Types: Cigarettes  . Smokeless tobacco: Never Used  . Tobacco comment: 1/2-1 ppd  Substance Use Topics  . Alcohol use: Yes    Alcohol/week: 2.0 standard drinks    Types: 2 Shots of liquor per week  . Drug use: No     Allergies   Hydrocodone-acetaminophen   Review of Systems Review of Systems  Respiratory: Positive for shortness of breath.   All other systems reviewed and are negative.    Physical Exam Updated Vital Signs BP (!) 139/92 (BP Location: Right Arm)   Pulse 86   Temp (!) 96.9 F (36.1 C) (Axillary)   Resp 20   Ht 5\' 4"  (1.626 m)   Wt 77.1 kg   SpO2 100%   BMI 29.18 kg/m   Physical Exam  Constitutional: She is oriented to person, place, and time.  Tachypneic, moderate distress   HENT:  Head: Normocephalic.  Eyes: Pupils are equal, round, and reactive to light. Conjunctivae and EOM are normal.  Neck: Normal range of motion. Neck supple.  Cardiovascular: Normal rate, regular rhythm and normal heart sounds.  Pulmonary/Chest:   Tachypneic, diminished throughout with poor air movement and diffuse wheezing. No crackles   Abdominal: Soft. Bowel sounds are normal. She exhibits no distension. There is no tenderness.  Musculoskeletal: Normal range of motion. She exhibits no edema or deformity.  Neurological: She is alert and oriented to person, place, and time.  Skin: Skin is warm.  Psychiatric: She has a normal mood and affect.  Nursing note and vitals reviewed.    ED Treatments / Results  Labs (all labs ordered are listed, but only abnormal results are displayed) Labs Reviewed  CBC WITH DIFFERENTIAL/PLATELET - Abnormal; Notable for the following components:      Result Value   RBC 3.04 (*)    Hemoglobin 8.9 (*)    HCT 26.9 (*)    RDW 17.5 (*)    Neutro Abs 7.3 (*)    All other components within normal limits  COMPREHENSIVE METABOLIC PANEL - Abnormal; Notable for the following components:   CO2 19 (*)    Glucose, Bld 343 (*)    Creatinine, Ser 1.23 (*)    Calcium 8.4 (*)    Total Protein 6.3 (*)    AST 90 (*)    ALT 57 (*)    GFR calc non Af Amer 45 (*)    GFR calc Af Amer 52 (*)    All other components within normal limits  TROPONIN I - Abnormal; Notable for the following components:   Troponin I 0.03 (*)    All other components within normal limits  BLOOD GAS, VENOUS - Abnormal; Notable for the following components:   pCO2, Ven 42 (*)    Bicarbonate 18.8 (*)    Acid-base deficit 7.8 (*)    All other components within normal limits  BRAIN NATRIURETIC PEPTIDE    EKG None  Radiology No results found.  Procedures Procedures (including critical care time)  CRITICAL CARE Performed by: Wandra Arthurs   Total  critical care time:30 minutes  Critical care time was exclusive of separately billable procedures and treating other patients.  Critical care was necessary to treat or prevent imminent or life-threatening deterioration.  Critical care was time spent personally by me on the following  activities: development of treatment plan with patient and/or surrogate as well as nursing, discussions with consultants, evaluation of patient's response to treatment, examination of patient, obtaining history from patient or surrogate, ordering and performing treatments and interventions, ordering and review of laboratory studies, ordering and review of radiographic studies, pulse oximetry and re-evaluation of patient's condition.   Medications Ordered in ED Medications  albuterol (PROVENTIL,VENTOLIN) solution continuous neb (15 mg/hr Nebulization Not Given 07/17/18 0937)  albuterol (PROVENTIL) (2.5 MG/3ML) 0.083% nebulizer solution (2.5 mg  Given 07/17/18 1638)     Initial Impression / Assessment and Plan / ED Course  I have reviewed the triage vital signs and the nursing notes.  Pertinent labs & imaging results that were available during my care of the patient were reviewed by me and considered in my medical decision making (see chart for details).    KIRBIE STODGHILL is a 67 y.o. female here with SOB, wheezing. Hx of COPD and still smoking cigarettes. Will get labs, BNP, CXR, VBG. Given solumedrol, magnesium by EMS. Will put on continuous neb and likely need admission.   10:20 AM PH 7.26. CO2 is 42. BNP 1700. CXR showed pulmonary edema. Given lasix, continuous nebs. Will keep on bipap for now. Will admit for COPD/ CHF exacerbation, respiratory distress with hypoxia.    Final Clinical Impressions(s) / ED Diagnoses   Final diagnoses:  None    ED Discharge Orders    None       Drenda Freeze, MD 07/17/18 1021

## 2018-07-17 NOTE — Telephone Encounter (Signed)
Form has been initialed and date and faxed to number provided below

## 2018-07-17 NOTE — H&P (Signed)
[place] [facility] [place] NAME: Ashley [name]    MR#:  [id number]  DATE OF BIRTH:  1951/07/06  DATE OF ADMISSION:  07/17/2018  PRIMARY CARE PHYSICIAN: [name], MD   REQUESTING/REFERRING PHYSICIAN: Drenda Freeze, MD  CHIEF COMPLAINT:   Chief Complaint  Patient presents with  . Respiratory Distress    HISTORY OF PRESENT ILLNESS: Ashley [name]  is a 67 y.o. female with a known history of COPD, coronary artery disease, chronic diastolic CHF, chronic kidney disease, coronary artery disease status post CABG, depression, GERD, essential hypertension, hyperlipidemia, status post mitral valve replacement, status post AICD placement who is presenting to the hospital with complaint of shortness of breath.  Patient states that she was recently hospitalized for CHF and COPD exasperation.  She states that she was doing well until this morning at 7 where she all of a sudden became short of breath.  She also noted she is gained to pounds.  She states that during her last discharge she was prescribed nebulizer therapy however she could not get nebulizers due to her insurance did not authorize this yet.  She has not had any fevers chills no chest pain palpitations denies any nausea vomiting or diarrhea.  Patient initially when she came had to be placed on BiPAP.  Now on high flow oxygen therapy.  She states that she is feeling better.  Chest x-ray [facility]     PAST MEDICAL HISTORY:   Past Medical History:  Diagnosis Date  . AICD (automatic cardioverter/defibrillator) present    on right side  . CAD (coronary artery disease)    s/p CABG  . CHF (congestive heart failure) ([place])   . Chronic kidney disease    renal artery stenosis  . Colon cancer ([place])   . COPD (chronic obstructive pulmonary disease) ([place])   . Depression   . Dyspnea   . GERD (gastroesophageal reflux disease)   . Hx of colonic polyps   . Hyperlipidemia   . Hypertension   .  Mitral valve disorder    s/p mitral valve repair wth CABG  . Myocardial infarction ([place])   . Peripheral vascular disease ([place])     PAST SURGICAL HISTORY:  Past Surgical History:  Procedure Laterality Date  . arm surgery     fracture, has plates and screws  . CABG with mitral valve repair    . COLON SURGERY     colon cancer  . COLONOSCOPY WITH PROPOFOL N/A 10/09/2016   Procedure: COLONOSCOPY WITH PROPOFOL;  Surgeon: Jonathon Bellows, MD;  Location: [facility] ENDOSCOPY;  Service: Endoscopy;  Laterality: N/A;  . CORONARY ARTERY BYPASS GRAFT    . pace maker defib  2016  . PERIPHERAL VASCULAR CATHETERIZATION N/A 10/23/2016   Procedure: Renal Angiography;  Surgeon: Algernon Huxley, MD;  Location: [facility] CV LAB;  Service: Cardiovascular;  Laterality: N/A;  . TOTAL ABDOMINAL HYSTERECTOMY  1999   history of abnormal pap    SOCIAL HISTORY:  Social History   Tobacco Use  . Smoking status: Current Every Day Smoker    Packs/day: 0.50    Types: Cigarettes  . Smokeless tobacco: Never Used  . Tobacco comment: 1/2-1 ppd  Substance Use Topics  . Alcohol use: Yes    Alcohol/week: 2.0 standard drinks    Types: 2 Shots of liquor per week    FAMILY HISTORY:  Family History  Problem Relation Age of Onset  . Arthritis Mother   . Cancer  Mother        uterus cancer  . Hyperlipidemia Mother   . Hypertension Mother   . Heart disease Mother   . Diabetes Mother   . Hyperlipidemia Father   . Hypertension Father   . Heart disease Father   . Diabetes Father   . Cancer Sister        ovary cancer  . Diabetes Maternal Grandmother   . Hypertension Maternal Grandmother   . Arthritis Maternal Grandmother   . Hypertension Maternal Grandfather   . Hypertension Paternal Grandmother   . Hypertension Paternal Grandfather   . Heart disease Paternal Grandfather   . Breast cancer Neg Hx     DRUG ALLERGIES:  Allergies  Allergen Reactions  . Hydrocodone-Acetaminophen Nausea Only    REVIEW OF SYSTEMS:    CONSTITUTIONAL: No fever, fatigue or weakness.  Positive weight gain EYES: No blurred or double vision.  EARS, NOSE, AND THROAT: No tinnitus or ear pain.  RESPIRATORY: No cough, positive shortness of breath, wheezing or hemoptysis.  CARDIOVASCULAR: No chest pain, orthopnea, edema.  GASTROINTESTINAL: No nausea, vomiting, diarrhea or abdominal pain.  GENITOURINARY: No dysuria, hematuria.  ENDOCRINE: No polyuria, nocturia,  HEMATOLOGY: No anemia, easy bruising or bleeding SKIN: No rash or lesion. MUSCULOSKELETAL: No joint pain or arthritis.   NEUROLOGIC: No tingling, numbness, weakness.  PSYCHIATRY: No anxiety or depression.   MEDICATIONS AT HOME:  Prior to Admission medications   Medication Sig Start Date End Date Taking? Authorizing Provider  acetaminophen (TYLENOL) 500 MG tablet Maximum 6 tablets a day. Patient taking differently: Take 500 mg by mouth every 4 (four) hours as needed for fever. Maximum 6 tablets a day. 02/09/15  Yes Doss, Velora Heckler, RN  ADVAIR DISKUS 250-50 MCG/DOSE AEPB INHALE 1 PUFF BY MOUTH TWICE A DAY. RINSE MOUTH AFTER EACH USE 04/09/18  Yes [name], MD  albuterol (PROVENTIL HFA;VENTOLIN HFA) 108 (90 Base) MCG/ACT inhaler Inhale 2 puffs into the lungs every 6 (six) hours as needed for wheezing. 05/08/18  Yes [name], MD  ALPRAZolam Duanne Moron) 0.5 MG tablet TAKE ONE TABLET BY MOUTH AT BEDTIME AS NEEDED FOR ANXIETY Patient taking differently: Take 0.25-0.5 mg by mouth at bedtime. TAKE ONE TABLET BY MOUTH AT BEDTIME AS NEEDED FOR ANXIETY 05/08/18  Yes [name], MD  amLODipine (NORVASC) 5 MG tablet Take 5 mg by mouth daily.   Yes [provider]  aspirin 81 MG tablet Take 81 mg by mouth daily.   Yes [provider]  atorvastatin (LIPITOR) 40 MG tablet Take 1 tablet (40 mg total) by mouth daily. 05/08/18  Yes [name], MD  carvedilol (COREG) 6.25 MG tablet TAKE ONE TABLET BY MOUTH TWICE DAILY WITH MEAL Patient taking differently: Take  6.25 mg by mouth 2 (two) times daily with a meal.  04/09/18  Yes [name], MD  clopidogrel (PLAVIX) 75 MG tablet TAKE ONE TABLET BY MOUTH EVERY DAY 04/09/18  Yes [name], MD  diphenhydrAMINE (DIPHENHIST) 25 mg capsule Take 25 mg by mouth at bedtime.   Yes [provider]  escitalopram (LEXAPRO) 10 MG tablet TAKE ONE TABLET BY MOUTH EVERY DAY 04/09/18  Yes [name], MD  furosemide (LASIX) 20 MG tablet TAKE ONE TABLET BY MOUTH EVERY DAY Patient taking differently: Take 20-40 mg by mouth daily. Take 20 mg by mouth daily. Take an additional 20 mg by mouth if weight gain over 2 lb in one day. 04/09/18  Yes Deborra Medina  L, MD  ipratropium-albuterol (DUONEB) 0.5-2.5 (3) MG/3ML SOLN Take 3 mLs by nebulization every 6 (six) hours as needed. 06/28/18  Yes [name], MD  lisinopril (PRINIVIL,ZESTRIL) 20 MG tablet TAKE ONE TABLET BY MOUTH EVERY DAY 04/09/18  Yes [name], MD  nitroGLYCERIN (NITROSTAT) 0.4 MG SL tablet Place 1 tablet (0.4 mg total) under the tongue every 5 (five) minutes as needed for chest pain. 06/19/16  Yes [name], MD  omeprazole (PRILOSEC) 40 MG capsule TAKE ONE CAPSULE BY MOUTH EVERY DAY 04/09/18  Yes [name], MD  SPIRIVA RESPIMAT 2.5 MCG/ACT AERS ACTIVATE AND INHALE TWO SPRAYS ORALLY INTO THE LUNGS ONCE DAILY. Patient taking differently: Inhale 1 puff into the lungs 2 (two) times daily.  01/09/18  Yes [name], MD      PHYSICAL EXAMINATION:   VITAL SIGNS: Blood pressure 134/83, pulse 91, temperature (!) 96.9 F (36.1 C), temperature source Axillary, resp. rate 16, height 5\' 4"  (1.626 m), weight 77.1 kg, SpO2 100 %.  GENERAL:  67 y.o.-year-old patient lying in the bed with respiratory distress with accessory muscle usage  eYES: Pupils equal, round, reactive to light and accommodation. No scleral icterus. Extraocular muscles intact.  HEENT: Head atraumatic, normocephalic. Oropharynx and nasopharynx clear.  NECK:  Supple, no jugular  venous distention. No thyroid enlargement, no tenderness.  LUNGS: Lateral crackles at the bases with diminished breath sounds in the rest of the lung with accessory muscle usage   CARDIOVASCULAR: S1, S2 normal.  Positive systolic murmurs, rubs, or gallops.  ABDOMEN: Soft, nontender, nondistended. Bowel sounds present. No organomegaly or mass.  EXTREMITIES: No pedal edema, cyanosis, or clubbing.  NEUROLOGIC: Cranial nerves II through XII are intact. Muscle strength 5/5 in all extremities. Sensation intact. Gait not checked.  PSYCHIATRIC: The patient is alert and oriented x 3.  SKIN: No obvious rash, lesion, or ulcer.   LABORATORY PANEL:   CBC Recent Labs  Lab 07/17/18 0914  WBC 9.4  HGB 8.9*  HCT 26.9*  PLT 331  MCV 88.7  MCH 29.5  MCHC 33.2  RDW 17.5*  LYMPHSABS 1.1  MONOABS 0.7  EOSABS 0.2  BASOSABS 0.1   ------------------------------------------------------------------------------------------------------------------  Chemistries  Recent Labs  Lab 07/17/18 0914  NA 139  K 4.3  CL 109  CO2 19*  GLUCOSE 343*  BUN 16  CREATININE 1.23*  CALCIUM 8.4*  AST 90*  ALT 57*  ALKPHOS 98  BILITOT 0.8   ------------------------------------------------------------------------------------------------------------------ estimated creatinine clearance is 45.9 mL/min (A) (by C-G formula based on SCr of 1.23 mg/dL (H)). ------------------------------------------------------------------------------------------------------------------ No results for input(s): TSH, T4TOTAL, T3FREE, THYROIDAB in the last 72 hours.  Invalid input(s): FREET3   Coagulation profile No results for input(s): INR, PROTIME in the last 168 hours. ------------------------------------------------------------------------------------------------------------------- No results for input(s): DDIMER in the last 72  hours. -------------------------------------------------------------------------------------------------------------------  Cardiac Enzymes Recent Labs  Lab 07/17/18 0914  TROPONINI 0.03*   ------------------------------------------------------------------------------------------------------------------ Invalid input(s): POCBNP  ---------------------------------------------------------------------------------------------------------------  Urinalysis    Component Value Date/Time   COLORURINE STRAW (A) 06/20/2016 2348   APPEARANCEUR CLEAR (A) 06/20/2016 2348   APPEARANCEUR Clear 08/19/2014 0025   LABSPEC 1.003 (L) 06/20/2016 2348   LABSPEC 1.009 08/19/2014 0025   PHURINE 6.0 06/20/2016 2348   GLUCOSEU NEGATIVE 06/20/2016 2348   GLUCOSEU 50 mg/dL 08/19/2014 0025   HGBUR 1+ (A) 06/20/2016 2348   BILIRUBINUR NEGATIVE 06/20/2016 2348   BILIRUBINUR Negative 08/19/2014 0025   KETONESUR NEGATIVE 06/20/2016 [place] NEGATIVE 06/20/2016 2348  NITRITE NEGATIVE 06/20/2016 2348   LEUKOCYTESUR NEGATIVE 06/20/2016 2348   LEUKOCYTESUR Negative 08/19/2014 0025     RADIOLOGY: Dg Chest Port 1 View  Result Date: 07/17/2018 CLINICAL DATA:  Acute presentation with respiratory distress. EXAM: PORTABLE CHEST 1 VIEW COMPARISON:  06/26/2018 FINDINGS: Previous median sternotomy and CABG. Pacemaker/AICD appears the same. Chronic cardiomegaly. Acute pulmonary edema with interstitial and alveolar components. No measurable effusion. No acute bone finding. IMPRESSION: Acute pulmonary edema. Electronically Signed   By: Nelson Chimes M.D.   On: 07/17/2018 09:57    EKG: Orders placed or performed during the hospital encounter of 07/17/18  . EKG 12-Lead  . EKG 12-Lead  . EKG 12-Lead  . EKG 12-Lead    IMPRESSION AND PLAN: Patient is a 67 year old presenting with acute respiratory failure  1.  Acute respiratory failure likely due to combination of acute diastolic CHF and acute COPD  exasperation  2.  Acute on chronic diastolic CHF we will treat with IV Lasix she had a recent echocardiogram done so no need to repeat Continue low-dose Coreg and lisinopril Cardiology consult with [facility] cardiology  3.  Acute on chronic COPD exasperation we will place patient on scheduled nebs, Pulmicort nebs, IV Solu-Medrol, Spiriva  4.  Essential hypertension we will continue amlodipine, lisinopril and Coreg  5.  Anxiety disorder continue alprazolam  6.  Coronary artery disease continue Plavix  7.  Nicotine abuse smoking cessation provided 4 minutes spent patient requesting nicotine patch I strongly recommend she stop smoking   All the records are reviewed and case discussed with ED provider. Management plans discussed with the patient, family and they are in agreement.  CODE STATUS: Code Status History    Date Active Date Inactive Code Status Order ID Comments User Context   10/23/2016 0917 10/23/2016 1630 Full Code [id number]  Algernon Huxley, MD Inpatient   06/21/2016 0437 06/21/2016 1758 Full Code [id number]  Harrie Foreman, MD Inpatient       TOTAL TIME TAKING CARE OF THIS PATIENT: 55 minutes.    Dustin Flock M.D on 07/17/2018 at 10:58 AM  Between 7am to 6pm - Pager - [phone]  After 6pm go to [email or web address] - password [email or web address]  [employer] Office  [phone]  CC: Primary care physician; [name], MD

## 2018-07-17 NOTE — Telephone Encounter (Signed)
Copied from [place] [phone]. Topic: General - Other >> Jul 17, 2018  8:52 AM Margot Ables wrote: Reason for CRM: Please have the correction on RX for nebulizer initialed and dated otherwise the RX cannot be accepted for Medicare. Or write new RX and send. Fax # [phone] (AttnWells [facility])

## 2018-07-17 NOTE — Telephone Encounter (Signed)
New rx request.

## 2018-07-17 NOTE — ED Notes (Signed)
Dorian, EDT, to transport pt to [address]232. Floor aware pt is en route.

## 2018-07-17 NOTE — Consult Note (Signed)
[facility] Consultation Note  Patient ID: Ashley [name], MRN: [id number], DOB/AGE: [birth year] 65 y.o. Admit date: 07/17/2018   Date of Consult: 07/17/2018 Primary Physician: [name], MD Primary Cardiologist: Raynelle Chary  Chief Complaint:  Chief Complaint  Patient presents with  . Respiratory Distress   Reason for Consult: Acute on chronic heart failure  HPI: 67 y.o. female with known coronary artery disease status post coronary artery bypass graft and significant LV systolic dysfunction congestive heart failure with hyperlipidemia hypertension and chronic kidney disease stage III.  The patient has been on all appropriate medication management and has received in the past and ICD with CRT.  This has significantly helped her but recently she has been anemic causing more shortness of breath physical activity weakness and fatigue.  Despite her medication management she has had increase in shortness of breath pulmonary edema did by chest x-ray with a BNP of 1730 and hemoglobin of 8.9.  The patient has had an EKG today showing normal sinus rhythm with ventricular pacing and a troponin of 0.05 consistent with demand ischemia rather than acute coronary syndrome.  Currently she has had a significant improvement of her symptoms after intravenous Lasix improvements of her pulmonary edema and is oxygenating better.  There is no current evidence of significant anginal symptoms.  We have discussed that her primary new problem may be anemia but we will further evaluate  Past Medical History:  Diagnosis Date  . AICD (automatic cardioverter/defibrillator) present    on right side  . CAD (coronary artery disease)    s/p CABG  . CHF (congestive heart failure) ([place])   . Chronic kidney disease    renal artery stenosis  . Colon cancer ([place])   . COPD (chronic obstructive pulmonary disease) ([place])   . Depression   . Dyspnea   . GERD (gastroesophageal reflux disease)   . Hx of colonic  polyps   . Hyperlipidemia   . Hypertension   . Mitral valve disorder    s/p mitral valve repair wth CABG  . Myocardial infarction ([place])   . Peripheral vascular disease [facility])       Surgical History:  Past Surgical History:  Procedure Laterality Date  . arm surgery     fracture, has plates and screws  . CABG with mitral valve repair    . COLON SURGERY     colon cancer  . COLONOSCOPY WITH PROPOFOL N/A 10/09/2016   Procedure: COLONOSCOPY WITH PROPOFOL;  Surgeon: Jonathon Bellows, MD;  Location: [facility] ENDOSCOPY;  Service: Endoscopy;  Laterality: N/A;  . CORONARY ARTERY BYPASS GRAFT    . pace maker defib  2016  . PERIPHERAL VASCULAR CATHETERIZATION N/A 10/23/2016   Procedure: Renal Angiography;  Surgeon: Algernon Huxley, MD;  Location: [place] [facility] LAB;  Service: Cardiovascular;  Laterality: N/A;  . TOTAL ABDOMINAL HYSTERECTOMY  1999   history of abnormal pap     Home Meds: Prior to Admission medications   Medication Sig Start Date End Date Taking? Authorizing Provider  acetaminophen (TYLENOL) 500 MG tablet Maximum 6 tablets a day. Patient taking differently: Take 500 mg by mouth every 4 (four) hours as needed for fever. Maximum 6 tablets a day. 02/09/15  Yes Doss, Velora Heckler, RN  ADVAIR DISKUS 250-50 MCG/DOSE AEPB INHALE 1 PUFF BY MOUTH TWICE A DAY. RINSE MOUTH AFTER EACH USE 04/09/18  Yes [name], MD  albuterol (PROVENTIL HFA;VENTOLIN HFA) 108 (90 Base) MCG/ACT inhaler Inhale 2 puffs into the lungs  every 6 (six) hours as needed for wheezing. 05/08/18  Yes [name], MD  ALPRAZolam Duanne Moron) 0.5 MG tablet TAKE ONE TABLET BY MOUTH AT BEDTIME AS NEEDED FOR ANXIETY Patient taking differently: Take 0.25-0.5 mg by mouth at bedtime. TAKE ONE TABLET BY MOUTH AT BEDTIME AS NEEDED FOR ANXIETY 05/08/18  Yes [name], MD  amLODipine (NORVASC) 5 MG tablet Take 5 mg by mouth daily.   Yes [provider]  aspirin 81 MG tablet Take 81 mg by mouth daily.   Yes [provider]   atorvastatin (LIPITOR) 40 MG tablet Take 1 tablet (40 mg total) by mouth daily. 05/08/18  Yes [name], MD  carvedilol (COREG) 6.25 MG tablet TAKE ONE TABLET BY MOUTH TWICE DAILY WITH MEAL Patient taking differently: Take 6.25 mg by mouth 2 (two) times daily with a meal.  04/09/18  Yes [name], MD  clopidogrel (PLAVIX) 75 MG tablet TAKE ONE TABLET BY MOUTH EVERY DAY 04/09/18  Yes [name], MD  diphenhydrAMINE (DIPHENHIST) 25 mg capsule Take 25 mg by mouth at bedtime.   Yes [provider]  escitalopram (LEXAPRO) 10 MG tablet TAKE ONE TABLET BY MOUTH EVERY DAY 04/09/18  Yes [name], MD  furosemide (LASIX) 20 MG tablet TAKE ONE TABLET BY MOUTH EVERY DAY Patient taking differently: Take 20-40 mg by mouth daily. Take 20 mg by mouth daily. Take an additional 20 mg by mouth if weight gain over 2 lb in one day. 04/09/18  Yes [name], MD  ipratropium-albuterol (DUONEB) 0.5-2.5 (3) MG/3ML SOLN Take 3 mLs by nebulization every 6 (six) hours as needed. 06/28/18  Yes [name], MD  lisinopril (PRINIVIL,ZESTRIL) 20 MG tablet TAKE ONE TABLET BY MOUTH EVERY DAY 04/09/18  Yes [name], MD  nitroGLYCERIN (NITROSTAT) 0.4 MG SL tablet Place 1 tablet (0.4 mg total) under the tongue every 5 (five) minutes as needed for chest pain. 06/19/16  Yes [name], MD  omeprazole (PRILOSEC) 40 MG capsule TAKE ONE CAPSULE BY MOUTH EVERY DAY 04/09/18  Yes [name], MD  SPIRIVA RESPIMAT 2.5 MCG/ACT AERS ACTIVATE AND INHALE TWO SPRAYS ORALLY INTO THE LUNGS ONCE DAILY. Patient taking differently: Inhale 1 puff into the lungs 2 (two) times daily.  01/09/18  Yes [name], MD    Inpatient Medications:  . albuterol  15 mg/hr Nebulization Once  . ALPRAZolam  0.25-0.5 mg Oral QHS  . amLODipine  5 mg Oral Daily  . aspirin EC  81 mg Oral Daily  . atorvastatin  40 mg Oral Daily  . budesonide (PULMICORT) nebulizer solution  0.25 mg Nebulization BID  . carvedilol  6.25 mg Oral  BID WC  . clopidogrel  75 mg Oral Daily  . diphenhydrAMINE  25 mg Oral QHS  . enoxaparin (LOVENOX) injection  40 mg Subcutaneous Q24H  . escitalopram  10 mg Oral Daily  . furosemide  40 mg Intravenous Q12H  . ipratropium-albuterol  3 mL Nebulization Q6H  . lisinopril  20 mg Oral Daily  . mouth rinse  15 mL Mouth Rinse BID  . mometasone-formoterol  2 puff Inhalation BID  . nicotine  21 mg Transdermal Daily  . pantoprazole  40 mg Oral Daily  . sodium chloride flush  3 mL Intravenous Q12H   . sodium chloride      Allergies:  Allergies  Allergen Reactions  . Hydrocodone-Acetaminophen Nausea Only    Social History   Socioeconomic History  .  Marital status: Widowed    Spouse name: Not on file  . Number of children: 0  . Years of education: 60  . Highest education level: 11th grade  Occupational History  . Occupation: disabled  Social Needs  . Financial resource strain: Not hard at all  . Food insecurity:    Worry: Never true    Inability: Never true  . Transportation needs:    Medical: No    Non-medical: No  Tobacco Use  . Smoking status: Current Every Day Smoker    Packs/day: 0.50    Types: Cigarettes  . Smokeless tobacco: Never Used  . Tobacco comment: 1/2-1 ppd  Substance and Sexual Activity  . Alcohol use: Yes    Alcohol/week: 2.0 standard drinks    Types: 2 Shots of liquor per week  . Drug use: No  . Sexual activity: Yes    Comment: 1 partner  Lifestyle  . Physical activity:    Days per week: 0 days    Minutes per session: Not on file  . Stress: To some extent  Relationships  . Social connections:    Talks on phone: Once a week    Gets together: Once a week    Attends religious service: Never    Active member of club or organization: No    Attends meetings of clubs or organizations: Never    Relationship status: Living with partner  . Intimate partner violence:    Fear of current or ex partner: No    Emotionally abused: No    Physically abused: No     Forced sexual activity: No  Other Topics Concern  . Not on file  Social History Narrative  . Not on file     Family History  Problem Relation Age of Onset  . Arthritis Mother   . Cancer Mother        uterus cancer  . Hyperlipidemia Mother   . Hypertension Mother   . Heart disease Mother   . Diabetes Mother   . Hyperlipidemia Father   . Hypertension Father   . Heart disease Father   . Diabetes Father   . Cancer Sister        ovary cancer  . Diabetes Maternal Grandmother   . Hypertension Maternal Grandmother   . Arthritis Maternal Grandmother   . Hypertension Maternal Grandfather   . Hypertension Paternal Grandmother   . Hypertension Paternal Grandfather   . Heart disease Paternal Grandfather   . Breast cancer Neg Hx      Review of Systems Positive for shortness of breath PND orthopnea Negative for: General:  chills, fever, night sweats or weight changes.  Cardiovascular: Positive for PND orthopnea negative for syncope dizziness  Dermatological skin lesions rashes Respiratory: Cough congestion Urologic: Frequent urination urination at night and hematuria Abdominal: negative for nausea, vomiting, diarrhea, bright red blood per rectum, melena, or hematemesis Neurologic: negative for visual changes, and/or hearing changes  All other systems reviewed and are otherwise negative except as noted above.  Labs: Recent Labs    07/17/18 0914 07/17/18 1307  TROPONINI 0.03* 0.05*   Lab Results  Component Value Date   WBC 9.4 07/17/2018   HGB 8.9 (L) 07/17/2018   HCT 26.9 (L) 07/17/2018   MCV 88.7 07/17/2018   PLT 331 07/17/2018    Recent Labs  Lab 07/17/18 0914  NA 139  K 4.3  CL 109  CO2 19*  BUN 16  CREATININE 1.23*  CALCIUM 8.4*  PROT 6.3*  BILITOT 0.8  ALKPHOS 98  ALT 57*  AST 90*  GLUCOSE 343*   Lab Results  Component Value Date   CHOL 222 (H) 05/06/2018   HDL 41 (L) 05/06/2018   LDLCALC 139 (H) 05/06/2018   TRIG 276 (H) 05/06/2018   No  results found for: DDIMER  Radiology/Studies:  Dg Chest Port 1 View  Result Date: 07/17/2018 CLINICAL DATA:  Acute presentation with respiratory distress. EXAM: PORTABLE CHEST 1 VIEW COMPARISON:  06/26/2018 FINDINGS: Previous median sternotomy and CABG. Pacemaker/AICD appears the same. Chronic cardiomegaly. Acute pulmonary edema with interstitial and alveolar components. No measurable effusion. No acute bone finding. IMPRESSION: Acute pulmonary edema. Electronically Signed   By: Nelson Chimes M.D.   On: 07/17/2018 09:57   Dg Chest Port 1 View  Result Date: 06/26/2018 CLINICAL DATA:  Shortness of breath EXAM: PORTABLE CHEST 1 VIEW COMPARISON:  06/06/2016 FINDINGS: Right AICD remains in place, unchanged. Cardiomegaly. Prior CABG. Vascular congestion and interstitial prominence may reflect interstitial edema or chronic interstitial lung disease. Bibasilar atelectasis or scarring. IMPRESSION: Cardiomegaly with vascular congestion. Diffuse interstitial prominence could reflect interstitial edema or chronic lung disease. Bibasilar atelectasis or scarring. Electronically Signed   By: Rolm Baptise M.D.   On: 06/26/2018 08:44    EKG: Normal sinus rhythm with ventricular pacing  Weights: Filed Weights   07/17/18 0922 07/17/18 1500  Weight: 77.1 kg 76.6 kg     Physical Exam: Blood pressure 123/70, pulse 85, temperature 98.3 F (36.8 C), temperature source Oral, resp. rate 20, height 5\' 4"  (1.626 m), weight 76.6 kg, SpO2 99 %. Body mass index is 28.99 kg/m. General: Well developed, well nourished, in no acute distress. Head eyes ears nose throat: Normocephalic, atraumatic, sclera non-icteric, no xanthomas, nares are without discharge. No apparent thyromegaly and/or mass  Lungs: Normal respiratory effort.  Few wheezes, basilar rales, no rhonchi.  Heart: RRR with normal S1 S2. no murmur gallop, no rub, PMI is normal size and placement, carotid upstroke normal without bruit, jugular venous pressure is  normal Abdomen: Soft, non-tender, non-distended with normoactive bowel sounds. No hepatomegaly. No rebound/guarding. No obvious abdominal masses. Abdominal aorta is normal size without bruit Extremities: No edema. no cyanosis, no clubbing, no ulcers  Peripheral : 2+ bilateral upper extremity pulses, 2+ bilateral femoral pulses, 2+ bilateral dorsal pedal pulse Neuro: Alert and oriented. No facial asymmetry. No focal deficit. Moves all extremities spontaneously. Musculoskeletal: Normal muscle tone without kyphosis Psych:  Responds to questions appropriately with a normal affect.    Assessment: 67 year old female with essential hypertension mixed hyperlipidemia coronary artery disease status post coronary bypass graft with cardiomyopathy and acute on chronic systolic dysfunction congestive heart failure without evidence of myocardial infarction  Plan: 1.  Continue intravenous Lasix for pulmonary edema and acute on chronic systolic dysfunction heart failure 2.  No change in current medical regimen for cardiomyopathy including antihypertensives including ACE inhibitor beta-blocker and calcium channel blocker 3.  Consider echocardiogram for LV systolic dysfunction valvular heart disease contributing to above 4.  Further investigation of anemia which may be the primary new problem causing her exacerbation 5.  High intensity cholesterol therapy for coronary artery disease 6.  Further treatment options after above  Signed, Corey Skains M.D. [facility] 07/17/2018, 6:40 PM

## 2018-07-17 NOTE — Progress Notes (Signed)
Advanced care plan.  Purpose of the Encounter: CODE STATUS  Parties in Attendance: Patient and her boyfriend  Patient's Decision Capacity: Intact  Subjective/Patient's story: Patient is 67 year old with history of diastolic CHF and COPD presenting with shortness of breath   Objective/Medical story  I discussed with the patient regarding her desire for cardiac and pulmonary resuscitation  Goals of care determination:   She states that she initially would want to be cardiac pulmonary resuscitated however if she remains on the machine she would not like that for prolonged period of time  CODE STATUS:  Full code  Time spent discussing advanced care planning: 16 minutes

## 2018-07-17 NOTE — ED Notes (Signed)
Freda, RRT, at bedside at this time.

## 2018-07-17 NOTE — ED Notes (Signed)
Attempted to call report x 1  

## 2018-07-18 LAB — CBC
HEMATOCRIT: 24.6 % — AB (ref 35.0–47.0)
Hemoglobin: 8.2 g/dL — ABNORMAL LOW (ref 12.0–16.0)
MCH: 28.9 pg (ref 26.0–34.0)
MCHC: 33.5 g/dL (ref 32.0–36.0)
MCV: 86.5 fL (ref 80.0–100.0)
PLATELETS: 255 10*3/uL (ref 150–440)
RBC: 2.84 MIL/uL — ABNORMAL LOW (ref 3.80–5.20)
RDW: 17.3 % — AB (ref 11.5–14.5)
WBC: 9.7 10*3/uL (ref 3.6–11.0)

## 2018-07-18 LAB — BASIC METABOLIC PANEL
ANION GAP: 9 (ref 5–15)
BUN: 26 mg/dL — ABNORMAL HIGH (ref 8–23)
CALCIUM: 8.8 mg/dL — AB (ref 8.9–10.3)
CO2: 24 mmol/L (ref 22–32)
Chloride: 103 mmol/L (ref 98–111)
Creatinine, Ser: 1.28 mg/dL — ABNORMAL HIGH (ref 0.44–1.00)
GFR calc Af Amer: 50 mL/min — ABNORMAL LOW (ref >60)
GFR, EST NON AFRICAN AMERICAN: 43 mL/min — AB (ref >60)
Glucose, Bld: 208 mg/dL — ABNORMAL HIGH (ref 70–99)
Potassium: 3.9 mmol/L (ref 3.5–5.1)
SODIUM: 136 mmol/L (ref 135–145)

## 2018-07-18 LAB — FOLATE: FOLATE: 7.6 ng/mL (ref >5.9)

## 2018-07-18 LAB — TROPONIN I: TROPONIN I: 0.05 ng/mL — AB (ref <0.03)

## 2018-07-18 LAB — IRON AND TIBC
IRON: 17 ug/dL — AB (ref 28–170)
SATURATION RATIOS: 4 % — AB (ref 10.4–31.8)
TIBC: 428 ug/dL (ref 250–450)
UIBC: 411 ug/dL

## 2018-07-18 MED ORDER — ALUM & MAG HYDROXIDE-SIMETH 200-200-20 MG/5ML PO SUSP
30.0000 mL | Freq: Four times a day (QID) | ORAL | Status: DC | PRN
  Administered 2018-07-18: 30 mL via ORAL

## 2018-07-18 MED ORDER — SIMETHICONE 40 MG/0.6ML PO SUSP
40.0000 mg | Freq: Once | ORAL | Status: DC
  Filled 2018-07-18: qty 0.6

## 2018-07-18 NOTE — Care Management (Signed)
RN reported no O2 needs on exertion.

## 2018-07-18 NOTE — Progress Notes (Signed)
SATURATION QUALIFICATIONS: (This note is used to comply with regulatory documentation for home oxygen)  Patient Saturations on Room Air at Rest = 99 %  Patient Saturations on Room Air while Ambulating = 98 %   

## 2018-07-18 NOTE — Care Management Note (Signed)
Case Management Note  Patient Details  Name: Ashley [name] MRN: [id number] Date of Birth: Jan 07, 1951  Subjective/Objective:      Patient admitted with COPD exacerbation, acute on chronic CHF.    Independent from home.  Denies difficulty affording medications.  Uses a scale daily to weigh self.  Reported no weight gain prior to this admission.  She stated she has been having insurance issues trying to get her Albuterol filled for about a week and a half.  I called [facility] and spoke with the pharmacist.  He ran the prescription and said it can be picked up.  Will cost $8.00.  Denies transportation issues.  Will begin to ambulate today and ask RN to assess O2 on exertion.  Continues on IV Lasix.     Action/Plan:   Expected Discharge Date:                  Expected Discharge Plan:  Home/Self Care  In-House Referral:     Discharge planning Services  CM Consult  Post Acute Care Choice:    Choice offered to:     DME Arranged:    DME Agency:     HH Arranged:    HH Agency:     Status of Service:  In process, will continue to follow  If discussed at Long Length of Stay Meetings, dates discussed:    Additional Comments:  Elza Rafter, RN 07/18/2018, 10:24 AM

## 2018-07-18 NOTE — Progress Notes (Signed)
[facility] Encounter Note  [name] / Admit Date: 07/17/2018 / Date of Encounter: 07/18/2018, 8:58 AM   Subjective: Patient is significantly improved from admission.  Much less shortness of breath pulmonary edema and/or lower extremity edema.  No evidence of myocardial infarction with peak troponin at 0.05.  Patient has had known anemia which may contribute to above.  Begin ambulation without evidence of significant symptoms  Review of Systems: Positive for: Shortness of breath Negative for: Vision change, hearing change, syncope, dizziness, nausea, vomiting,diarrhea, bloody stool, stomach pain, cough, congestion, diaphoresis, urinary frequency, urinary pain,skin lesions, skin rashes Others previously listed  Objective: Telemetry: Normal sinus rhythm Physical Exam: Blood pressure 106/67, pulse 77, temperature 98.5 F (36.9 C), temperature source Oral, resp. rate 18, height 5\' 4"  (1.626 m), weight 76.3 kg, SpO2 100 %. Body mass index is 28.87 kg/m. General: Well developed, well nourished, in no acute distress. Head: Normocephalic, atraumatic, sclera non-icteric, no xanthomas, nares are without discharge. Neck: No apparent masses Lungs: Normal respirations with no wheezes, no rhonchi, no rales , basilar crackles   Heart: Regular rate and rhythm, normal S1 S2, no murmur, no rub, no gallop, PMI is normal size and placement, carotid upstroke normal without bruit, jugular venous pressure normal Abdomen: Soft, non-tender, non-distended with normoactive bowel sounds. No hepatosplenomegaly. Abdominal aorta is normal size without bruit Extremities: Trace edema, no clubbing, no cyanosis, no ulcers,  Peripheral: 2+ radial, 2+ femoral, 2+ dorsal pedal pulses Neuro: Alert and oriented. Moves all extremities spontaneously. Psych:  Responds to questions appropriately with a normal affect.   Intake/Output Summary (Last 24 hours) at 07/18/2018 0858 Last data filed  at 07/18/2018 0448 Gross per 24 hour  Intake 243 ml  Output 2300 ml  Net -2057 ml    Inpatient Medications:  . albuterol  15 mg/hr Nebulization Once  . ALPRAZolam  0.25-0.5 mg Oral QHS  . amLODipine  5 mg Oral Daily  . aspirin EC  81 mg Oral Daily  . atorvastatin  40 mg Oral Daily  . budesonide (PULMICORT) nebulizer solution  0.25 mg Nebulization BID  . carvedilol  6.25 mg Oral BID WC  . clopidogrel  75 mg Oral Daily  . diphenhydrAMINE  25 mg Oral QHS  . enoxaparin (LOVENOX) injection  40 mg Subcutaneous Q24H  . escitalopram  10 mg Oral Daily  . furosemide  40 mg Intravenous Q12H  . ipratropium-albuterol  3 mL Nebulization Q6H  . lisinopril  20 mg Oral Daily  . mouth rinse  15 mL Mouth Rinse BID  . mometasone-formoterol  2 puff Inhalation BID  . nicotine  21 mg Transdermal Daily  . pantoprazole  40 mg Oral Daily  . sodium chloride flush  3 mL Intravenous Q12H   Infusions:  . sodium chloride      Labs: Recent Labs    07/17/18 0914 07/18/18 0007  NA 139 136  K 4.3 3.9  CL 109 103  CO2 19* 24  GLUCOSE 343* 208*  BUN 16 26*  CREATININE 1.23* 1.28*  CALCIUM 8.4* 8.8*   Recent Labs    07/17/18 0914  AST 90*  ALT 57*  ALKPHOS 98  BILITOT 0.8  PROT 6.3*  ALBUMIN 3.5   Recent Labs    07/17/18 0914 07/18/18 0007  WBC 9.4 9.7  NEUTROABS 7.3*  --   HGB 8.9* 8.2*  HCT 26.9* 24.6*  MCV 88.7 86.5  PLT 331 255   Recent Labs    07/17/18 0914  07/17/18 1307 07/17/18 1827 07/18/18 0007  TROPONINI 0.03* 0.05* 0.06* 0.05*   Invalid input(s): POCBNP No results for input(s): HGBA1C in the last 72 hours.   Weights: Filed Weights   07/17/18 0922 07/17/18 1500 07/18/18 0448  Weight: 77.1 kg 76.6 kg 76.3 kg     Radiology/Studies:  Dg Chest Port 1 View  Result Date: 07/17/2018 CLINICAL DATA:  Acute presentation with respiratory distress. EXAM: PORTABLE CHEST 1 VIEW COMPARISON:  06/26/2018 FINDINGS: Previous median sternotomy and CABG. Pacemaker/AICD appears  the same. Chronic cardiomegaly. Acute pulmonary edema with interstitial and alveolar components. No measurable effusion. No acute bone finding. IMPRESSION: Acute pulmonary edema. Electronically Signed   By: Nelson Chimes M.D.   On: 07/17/2018 09:57   Dg Chest Port 1 View  Result Date: 06/26/2018 CLINICAL DATA:  Shortness of breath EXAM: PORTABLE CHEST 1 VIEW COMPARISON:  06/06/2016 FINDINGS: Right AICD remains in place, unchanged. Cardiomegaly. Prior CABG. Vascular congestion and interstitial prominence may reflect interstitial edema or chronic interstitial lung disease. Bibasilar atelectasis or scarring. IMPRESSION: Cardiomegaly with vascular congestion. Diffuse interstitial prominence could reflect interstitial edema or chronic lung disease. Bibasilar atelectasis or scarring. Electronically Signed   By: Rolm Baptise M.D.   On: 06/26/2018 08:44     Assessment and Recommendation  67 y.o. female with known systolic dysfunction congestive heart failure status post biventricular pacer and defibrillator coronary artery bypass surgery with acute on chronic systolic dysfunction congestive heart failure without evidence of myocardial infarction 1.  Continue intravenous Lasix as necessary for resolution of pulmonary edema and lower extremity edema 2.  Continue beta-blocker ACE inhibitor calcium channel blocker for hypertension control and cardiomyopathy 3.  Continue single antiplatelet therapy at this time due to significant anemia until further evaluation of possible cause of anemia and colonoscopy 4.  Patient is at lowest risk possible for colonoscopy for further evaluation of causes of anemia 5.  Okay for discharge home if ambulating well with follow-up next week for further adjustments of medication management  Signed, Serafina Royals M.D. FACC

## 2018-07-18 NOTE — Progress Notes (Addendum)
[place] [facility] [place] NAME: Shakeila Pfarr    MR#:  [id number]  DATE OF BIRTH:  Dec 04, 1950  SUBJECTIVE:  CHIEF COMPLAINT: Patient is feeling much better.  Shortness of breath is improving.  REVIEW OF SYSTEMS:  CONSTITUTIONAL: No fever, fatigue or weakness.  EYES: No blurred or double vision.  EARS, NOSE, AND THROAT: No tinnitus or ear pain.  RESPIRATORY: No cough, states shortness of breath is improving denies wheezing or hemoptysis  CARDIOVASCULAR: No chest pain, orthopnea, edema.  GASTROINTESTINAL: No nausea, vomiting, diarrhea or abdominal pain.  GENITOURINARY: No dysuria, hematuria.  ENDOCRINE: No polyuria, nocturia,  HEMATOLOGY: No anemia, easy bruising or bleeding SKIN: No rash or lesion. MUSCULOSKELETAL: No joint pain or arthritis.   NEUROLOGIC: No tingling, numbness, weakness.  PSYCHIATRY: No anxiety or depression.   DRUG ALLERGIES:   Allergies  Allergen Reactions  . Hydrocodone-Acetaminophen Nausea Only    VITALS:  Blood pressure 106/67, pulse 77, temperature 98.5 F (36.9 C), temperature source Oral, resp. rate 18, height 5\' 4"  (1.626 m), weight 76.3 kg, SpO2 99 %.  PHYSICAL EXAMINATION:  GENERAL:  67 y.o.-year-old patient lying in the bed with no acute distress.  EYES: Pupils equal, round, reactive to light and accommodation. No scleral icterus. Extraocular muscles intact.  HEENT: Head atraumatic, normocephalic. Oropharynx and nasopharynx clear.  NECK:  Supple, no jugular venous distention. No thyroid enlargement, no tenderness.  LUNGS: Moderate breath sounds bilaterally, no wheezing, rales,rhonchi or crepitation. No use of accessory muscles of respiration.  CARDIOVASCULAR: S1, S2 normal. No murmurs, rubs, or gallops.  ABDOMEN: Soft, nontender, nondistended. Bowel sounds present. No organomegaly or mass.  EXTREMITIES: No pedal edema, cyanosis, or clubbing.  NEUROLOGIC: Cranial nerves II through XII are intact.  Muscle strength 5/5 in all extremities. Sensation intact. Gait not checked.  PSYCHIATRIC: The patient is alert and oriented x 3.  SKIN: No obvious rash, lesion, or ulcer.    LABORATORY PANEL:   CBC Recent Labs  Lab 07/18/18 0007  WBC 9.7  HGB 8.2*  HCT 24.6*  PLT 255   ------------------------------------------------------------------------------------------------------------------  Chemistries  Recent Labs  Lab 07/17/18 0914 07/18/18 0007  NA 139 136  K 4.3 3.9  CL 109 103  CO2 19* 24  GLUCOSE 343* 208*  BUN 16 26*  CREATININE 1.23* 1.28*  CALCIUM 8.4* 8.8*  AST 90*  --   ALT 57*  --   ALKPHOS 98  --   BILITOT 0.8  --    ------------------------------------------------------------------------------------------------------------------  Cardiac Enzymes Recent Labs  Lab 07/18/18 0007  TROPONINI 0.05*   ------------------------------------------------------------------------------------------------------------------  RADIOLOGY:  Dg Chest Port 1 View  Result Date: 07/17/2018 CLINICAL DATA:  Acute presentation with respiratory distress. EXAM: PORTABLE CHEST 1 VIEW COMPARISON:  06/26/2018 FINDINGS: Previous median sternotomy and CABG. Pacemaker/AICD appears the same. Chronic cardiomegaly. Acute pulmonary edema with interstitial and alveolar components. No measurable effusion. No acute bone finding. IMPRESSION: Acute pulmonary edema. Electronically Signed   By: Nelson Chimes M.D.   On: 07/17/2018 09:57    EKG:   Orders placed or performed during the hospital encounter of 07/17/18  . EKG 12-Lead  . EKG 12-Lead  . EKG 12-Lead  . EKG 12-Lead    ASSESSMENT AND PLAN:   Patient is a 67 year old presenting with acute respiratory failure  1.  Acute respiratory failure likely due to combination of acute diastolic CHF    2.  Acute on chronic diastolic CHF  Clinically improving with IV Lasix with potassium  supplements as needed  she had a recent echocardiogram done  so no need to repeat Continue low-dose Coreg and lisinopril Cardiology Dr. Nehemiah Massed has seen the patient Continue beta-blocker, ACE inhibitor lisinopril  3.  chronic COPD.  No exacerbation noticed.  Continue breathing treatments and inhalers as needed   4.  Essential hypertension we will continue amlodipine, lisinopril and Coreg  5.  Anxiety disorder continue alprazolam  6.  Coronary artery disease continue Plavix  7.  Nicotine abuse smoking cessation provided 4 minutes spent patient requesting nicotine patch I strongly recommend she stop smoking  8.  Anemia of chronic disease.  Normocytic anemia  outpatient follow-up with GI for colonoscopy. Iron studies, B12 and folate Okay to continue single agent antiplatelet therapy according to cardiology until patient gets further work-up regarding her anemia TSH is normal   All the records are reviewed and case discussed with Care Management/Social Workerr. Management plans discussed with the patient, family and they are in agreement.  CODE STATUS: fc   TOTAL TIME TAKING CARE OF THIS PATIENT: 35  minutes.   POSSIBLE D/C IN 1-2  DAYS, DEPENDING ON CLINICAL CONDITION.  Note: This dictation was prepared with Dragon dictation along with smaller phrase technology. Any transcriptional errors that result from this process are unintentional.   Nicholes Mango M.D on 07/18/2018 at 3:33 PM  Between 7am to 6pm - Pager - [phone] After 6pm go to [email or web address] - password [email or web address] [facility]  Office  [phone]  CC: Primary care physician; [name], MD

## 2018-07-18 NOTE — Progress Notes (Signed)
Inpatient Diabetes Program Recommendations  AACE/ADA: New Consensus Statement on Inpatient Glycemic Control (2019)  Target Ranges:  Prepandial:   less than 140 mg/dL      Peak postprandial:   less than 180 mg/dL (1-2 hours)      Critically ill patients:  140 - 180 mg/dL  Results for [facility]" (MRN [id number]) as of 07/18/2018 08:28  Ref. Range 07/17/2018 09:14 07/18/2018 00:07  Glucose Latest Ref Range: 70 - 99 mg/dL 343 (H) 208 (H)   Results for INAYAH, WOODIN" (MRN [id number]) as of 07/18/2018 08:28  Ref. Range 05/06/2018 11:11  Hemoglobin A1C Latest Ref Range: 4.6 - 6.5 % 6.2   Review of Glycemic Control Diabetes history: NO Outpatient Diabetes medications: NA Current orders for Inpatient glycemic control: None  Inpatient Diabetes Program Recommendations: Correction (SSI): Please consider ordering CBGs with Novolog 0-9 units TID with meals and Novolog 0-5 units QHS.  NOTE: No DM hx noted in chart. Per ED Triage note by P. Haugh, RN patient was given Solumedrol 125 mg en route to hospital by [facility] on 07/17/18. Last A1C was 6.2% on 05/06/2018. Could order a new A1C but it will not be accurate due to low hemoglobin (8.2 g/dL).  Thanks, Barnie Alderman, RN, MSN, CDE Diabetes Coordinator Inpatient Diabetes Program [phone] (Team Pager from 8am to 5pm)

## 2018-07-19 LAB — TRANSFERRIN: TRANSFERRIN: 320 mg/dL (ref 192–382)

## 2018-07-19 MED ORDER — NICOTINE 21 MG/24HR TD PT24: 21.0000 mg | MEDICATED_PATCH | Freq: Every day | TRANSDERMAL | 0 refills | Status: DC

## 2018-07-19 MED ORDER — SIMETHICONE 80 MG PO CHEW
40.0000 mg | CHEWABLE_TABLET | Freq: Once | ORAL | Status: AC
  Administered 2018-07-19: 40 mg via ORAL
  Filled 2018-07-19: qty 1

## 2018-07-19 MED ORDER — FUROSEMIDE 40 MG PO TABS: 40.0000 mg | ORAL_TABLET | Freq: Two times a day (BID) | ORAL | 0 refills | Status: DC

## 2018-07-19 MED ORDER — POTASSIUM CHLORIDE ER 10 MEQ PO TBCR: 10.0000 meq | EXTENDED_RELEASE_TABLET | Freq: Every day | ORAL | 0 refills | Status: DC

## 2018-07-19 MED ORDER — SODIUM CHLORIDE 0.9 % IV SOLN
100.0000 mg | Freq: Once | INTRAVENOUS | Status: AC
  Administered 2018-07-19: 100 mg via INTRAVENOUS
  Filled 2018-07-19: qty 5

## 2018-07-19 MED ORDER — FERROUS SULFATE 325 (65 FE) MG PO TABS: 325.0000 mg | ORAL_TABLET | Freq: Two times a day (BID) | ORAL | Status: DC

## 2018-07-19 MED ORDER — FERROUS SULFATE 325 (65 FE) MG PO TABS: 325.0000 mg | ORAL_TABLET | Freq: Two times a day (BID) | ORAL | 3 refills | Status: DC

## 2018-07-19 NOTE — Discharge Instructions (Signed)
Follow-up with primary care physician in 3 days Follow-up with cardiology Dr. Nehemiah Massed in 7 to 10 days  Follow-up with gastroenterology as scheduled on 07/24/2018 a day to get colonoscopy.  Hold Plavix for the colonoscopy Follow-up with CHF clinic in 5 to 7 days.  Check daily weights and intake and output

## 2018-07-19 NOTE — Care Management Note (Signed)
Case Management Note  Patient Details  Name: Ashley [name] MRN: [id number] Date of Birth: 12/27/1950  Subjective/Objective:    Patient is being discharged after iron infusion which she is just starting.  Iron is 17 and HGB 8.2.  Patient denies any assistance for discharge from [facility].  She has her follow up appointments scheduled and listed on AVS.  She does have a heart failure clinic appointment.  Her finance is picking her up later this morning.  Remains on room air.              Action/Plan:   Expected Discharge Date:  07/19/18               Expected Discharge Plan:  Home/Self Care  In-House Referral:     Discharge planning Services  CM Consult  Post Acute Care Choice:    Choice offered to:     DME Arranged:    DME Agency:     HH Arranged:    HH Agency:     Status of Service:  Completed, signed off  If discussed at [employer] of Stay Meetings, dates discussed:    Additional Comments:  Elza Rafter, RN 07/19/2018, 10:31 AM

## 2018-07-19 NOTE — Discharge Summary (Signed)
[place] at [place] NAME: Ashley [name]    MR#:  [id number]  DATE OF BIRTH:  Jul 06, 1951  DATE OF ADMISSION:  07/17/2018 ADMITTING PHYSICIAN: Dustin Flock, MD  DATE OF DISCHARGE: 07/19/18  PRIMARY CARE PHYSICIAN: [name], MD    ADMISSION DIAGNOSIS:  COPD exacerbation ([place]) [J44.1] Acute respiratory failure with hypoxia ([place]) [J96.01] Acute on chronic congestive heart failure, unspecified heart failure type ([place]) [I50.9]  DISCHARGE DIAGNOSIS:  Active Problems:   Acute CHF (congestive heart failure) ([place])   SECONDARY DIAGNOSIS:   Past Medical History:  Diagnosis Date  . AICD (automatic cardioverter/defibrillator) present    on right side  . CAD (coronary artery disease)    s/p CABG  . CHF (congestive heart failure) ([place])   . Chronic kidney disease    renal artery stenosis  . Colon cancer ([place])   . COPD (chronic obstructive pulmonary disease) ([place])   . Depression   . Dyspnea   . GERD (gastroesophageal reflux disease)   . Hx of colonic polyps   . Hyperlipidemia   . Hypertension   . Mitral valve disorder    s/p mitral valve repair wth CABG  . Myocardial infarction ([place])   . Peripheral vascular disease [facility])     HOSPITAL COURSE:   HISTORY OF PRESENT ILLNESS: Ashley [name]  is a 67 y.o. female with a known history of COPD, coronary artery disease, chronic diastolic CHF, chronic kidney disease, coronary artery disease status post CABG, depression, GERD, essential hypertension, hyperlipidemia, status post mitral valve replacement, status post AICD placement who is presenting to the hospital with complaint of shortness of breath.  Patient states that she was recently hospitalized for CHF and COPD exasperation.  She states that she was doing well until this morning at 7 where she all of a sudden became short of breath.  She also noted she is gained to pounds.  She states that during her last discharge she was  prescribed nebulizer therapy however she could not get nebulizers due to her insurance did not authorize this yet.  She has not had any fevers chills no chest pain palpitations denies any nausea vomiting or diarrhea.  Patient initially when she came had to be placed on BiPAP.  Now on high flow oxygen therapy.  She states that she is feeling better.  Chest x-ray [facility].  1.Acute respiratory failure likely due to combination of acute diastolic CHF    2.Acute on chronic diastolic CHF  Clinically improved with IV Lasix.  Discharge patient with p.o. Lasix and with potassium supplements   she had a recent echocardiogram done so no need to repeat Continue low-dose Coreg and lisinopril Cardiology Dr. Nehemiah Massed has seen the patient Continue beta-blocker, ACE inhibitor lisinopril  3.chronic COPD.  No exacerbation noticed.  Continue breathing treatments and inhalers as needed   4.Essential hypertension we will continue amlodipine,lisinopril and Coreg  5.Anxiety disorder continue alprazolam  6.Coronary artery disease continue Plavix  7.Nicotine abuse smoking cessation provided 4 minutes spent patient requesting nicotine patch I strongly recommend she stop smoking  8.  Anemia of chronic disease.  Normocytic anemia  outpatient follow-up with GI on Wednesday as scheduled for colonoscopy. Iron studies, iron 17, total iron binding capacity 428 transferrin 320.  IV Venofer given.  Outpatient result Normal folate Okay to continue single agent antiplatelet therapy after colonoscopy according to cardiology until patient gets further work-up regarding her anemia TSH is normal  DISCHARGE CONDITIONS:  stable  CONSULTS OBTAINED:  Treatment Team:  Corey Skains, MD   PROCEDURES  None   DRUG ALLERGIES:   Allergies  Allergen Reactions  . Hydrocodone-Acetaminophen Nausea Only    DISCHARGE MEDICATIONS:   Allergies as of 07/19/2018      Reactions    Hydrocodone-acetaminophen Nausea Only      Medication List    STOP taking these medications   clopidogrel 75 MG tablet Commonly known as:  PLAVIX     TAKE these medications   acetaminophen 500 MG tablet Commonly known as:  TYLENOL Maximum 6 tablets a day. What changed:    how much to take  how to take this  when to take this  reasons to take this   ADVAIR DISKUS 250-50 MCG/DOSE Aepb Generic drug:  Fluticasone-Salmeterol INHALE 1 PUFF BY MOUTH TWICE A DAY. RINSE MOUTH AFTER EACH USE   albuterol 108 (90 Base) MCG/ACT inhaler Commonly known as:  PROVENTIL HFA;VENTOLIN HFA Inhale 2 puffs into the lungs every 6 (six) hours as needed for wheezing.   ALPRAZolam 0.5 MG tablet Commonly known as:  XANAX TAKE ONE TABLET BY MOUTH AT BEDTIME AS NEEDED FOR ANXIETY What changed:    how much to take  how to take this  when to take this   amLODipine 5 MG tablet Commonly known as:  NORVASC Take 5 mg by mouth daily.   aspirin 81 MG tablet Take 81 mg by mouth daily.   atorvastatin 40 MG tablet Commonly known as:  LIPITOR Take 1 tablet (40 mg total) by mouth daily.   carvedilol 6.25 MG tablet Commonly known as:  COREG TAKE ONE TABLET BY MOUTH TWICE DAILY WITH MEAL What changed:  See the new instructions.   DIPHENHIST 25 mg capsule Generic drug:  diphenhydrAMINE Take 25 mg by mouth at bedtime.   escitalopram 10 MG tablet Commonly known as:  LEXAPRO TAKE ONE TABLET BY MOUTH EVERY DAY   ferrous sulfate 325 (65 FE) MG tablet Take 1 tablet (325 mg total) by mouth 2 (two) times daily with a meal. Start taking on:  07/20/2018   furosemide 40 MG tablet Commonly known as:  LASIX Take 1 tablet (40 mg total) by mouth 2 (two) times daily. What changed:    medication strength  how much to take  when to take this   ipratropium-albuterol 0.5-2.5 (3) MG/3ML Soln Commonly known as:  DUONEB Take 3 mLs by nebulization every 6 (six) hours as needed.   lisinopril 20 MG  tablet Commonly known as:  PRINIVIL,ZESTRIL TAKE ONE TABLET BY MOUTH EVERY DAY   nicotine 21 mg/24hr patch Commonly known as:  NICODERM CQ - dosed in mg/24 hours Place 1 patch (21 mg total) onto the skin daily. Start taking on:  07/20/2018   nitroGLYCERIN 0.4 MG SL tablet Commonly known as:  NITROSTAT Place 1 tablet (0.4 mg total) under the tongue every 5 (five) minutes as needed for chest pain.   omeprazole 40 MG capsule Commonly known as:  PRILOSEC TAKE ONE CAPSULE BY MOUTH EVERY DAY   potassium chloride 10 MEQ tablet Commonly known as:  K-DUR Take 1 tablet (10 mEq total) by mouth daily.   SPIRIVA RESPIMAT 2.5 MCG/ACT Aers Generic drug:  Tiotropium Bromide Monohydrate ACTIVATE AND INHALE TWO SPRAYS ORALLY INTO THE LUNGS ONCE DAILY. What changed:  See the new instructions.        DISCHARGE INSTRUCTIONS:   Follow-up with primary care physician in 3 days Follow-up with cardiology Dr. Nehemiah Massed in  7 to 10 days  Follow-up with gastroenterology as scheduled on 07/24/2018 a day to get colonoscopy.  Hold Plavix for the colonoscopy Follow-up with CHF clinic in 5 to 7 days.  Check daily weights and intake and output DIET:  Cardiac diet  DISCHARGE CONDITION:  Stable  ACTIVITY:  Activity as tolerated  OXYGEN:  Home Oxygen: No.   Oxygen Delivery: room air  DISCHARGE LOCATION:  home   If you experience worsening of your admission symptoms, develop shortness of breath, life threatening emergency, suicidal or homicidal thoughts you must seek medical attention immediately by calling 911 or calling your MD immediately  if symptoms less severe.  You Must read complete instructions/literature along with all the possible adverse reactions/side effects for all the Medicines you take and that have been prescribed to you. Take any new Medicines after you have completely understood and accpet all the possible adverse reactions/side effects.   Please note  You were cared for by a  hospitalist during your hospital stay. If you have any questions about your discharge medications or the care you received while you were in the hospital after you are discharged, you can call the unit and asked to speak with the hospitalist on call if the hospitalist that took care of you is not available. Once you are discharged, your primary care physician will handle any further medical issues. Please note that NO REFILLS for any discharge medications will be authorized once you are discharged, as it is imperative that you return to your primary care physician (or establish a relationship with a primary care physician if you do not have one) for your aftercare needs so that they can reassess your need for medications and monitor your lab values.     Today  Chief Complaint  Patient presents with  . Respiratory Distress   Patient is doing much better.  Denies any chest pain or shortness of breath has received 1 dose of IV Venofer.  ROS:  CONSTITUTIONAL: Denies fevers, chills. Denies any fatigue, weakness.  EYES: Denies blurry vision, double vision, eye pain. EARS, NOSE, THROAT: Denies tinnitus, ear pain, hearing loss. RESPIRATORY: Denies cough, wheeze, shortness of breath.  CARDIOVASCULAR: Denies chest pain, palpitations, edema.  GASTROINTESTINAL: Denies nausea, vomiting, diarrhea, abdominal pain. Denies bright red blood per rectum. GENITOURINARY: Denies dysuria, hematuria. ENDOCRINE: Denies nocturia or thyroid problems. HEMATOLOGIC AND LYMPHATIC: Denies easy bruising or bleeding. SKIN: Denies rash or lesion. MUSCULOSKELETAL: Denies pain in neck, back, shoulder, knees, hips or arthritic symptoms.  NEUROLOGIC: Denies paralysis, paresthesias.  PSYCHIATRIC: Denies anxiety or depressive symptoms.   VITAL SIGNS:  Blood pressure 117/72, pulse 75, temperature 97.6 F (36.4 C), temperature source Oral, resp. rate 18, height 5\' 4"  (1.626 m), weight 75.8 kg, SpO2 100 %.  I/O:     Intake/Output Summary (Last 24 hours) at 07/19/2018 1217 Last data filed at 07/19/2018 1213 Gross per 24 hour  Intake 3 ml  Output 1300 ml  Net -1297 ml    PHYSICAL EXAMINATION:  GENERAL:  67 y.o.-year-old patient lying in the bed with no acute distress.  EYES: Pupils equal, round, reactive to light and accommodation. No scleral icterus. Extraocular muscles intact.  HEENT: Head atraumatic, normocephalic. Oropharynx and nasopharynx clear.  NECK:  Supple, no jugular venous distention. No thyroid enlargement, no tenderness.  LUNGS: Normal breath sounds bilaterally, no wheezing, rales,rhonchi or crepitation. No use of accessory muscles of respiration.  CARDIOVASCULAR: S1, S2 normal. No murmurs, rubs, or gallops.  ABDOMEN: Soft, non-tender, non-distended. Bowel sounds  present. No organomegaly or mass.  EXTREMITIES: No pedal edema, cyanosis, or clubbing.  NEUROLOGIC: Cranial nerves II through XII are intact. Muscle strength 5/5 in all extremities. Sensation intact. Gait not checked.  PSYCHIATRIC: The patient is alert and oriented x 3.  SKIN: No obvious rash, lesion, or ulcer.   DATA REVIEW:   CBC Recent Labs  Lab 07/18/18 0007  WBC 9.7  HGB 8.2*  HCT 24.6*  PLT 255    Chemistries  Recent Labs  Lab 07/17/18 0914 07/18/18 0007  NA 139 136  K 4.3 3.9  CL 109 103  CO2 19* 24  GLUCOSE 343* 208*  BUN 16 26*  CREATININE 1.23* 1.28*  CALCIUM 8.4* 8.8*  AST 90*  --   ALT 57*  --   ALKPHOS 98  --   BILITOT 0.8  --     Cardiac Enzymes Recent Labs  Lab 07/18/18 0007  TROPONINI 0.05*    Microbiology Results  Results for orders placed or performed in visit on 07/02/18  Fecal occult blood, imunochemical     Status: Abnormal   Collection Time: 07/02/18  4:14 PM  Result Value Ref Range Status   Fecal Occult Bld Positive (A) Negative Final    RADIOLOGY:  Dg Chest Port 1 View  Result Date: 07/17/2018 CLINICAL DATA:  Acute presentation with respiratory distress. EXAM:  PORTABLE CHEST 1 VIEW COMPARISON:  06/26/2018 FINDINGS: Previous median sternotomy and CABG. Pacemaker/AICD appears the same. Chronic cardiomegaly. Acute pulmonary edema with interstitial and alveolar components. No measurable effusion. No acute bone finding. IMPRESSION: Acute pulmonary edema. Electronically Signed   By: Nelson Chimes M.D.   On: 07/17/2018 09:57    EKG:   Orders placed or performed during the hospital encounter of 07/17/18  . EKG 12-Lead  . EKG 12-Lead  . EKG 12-Lead  . EKG 12-Lead      Management plans discussed with the patient, family and they are in agreement.  CODE STATUS:     Code Status Orders  (From admission, onward)         Start     Ordered   07/17/18 1214  Full code  Continuous     07/17/18 1213        Code Status History    Date Active Date Inactive Code Status Order ID Comments User Context   10/23/2016 0917 10/23/2016 1630 Full Code [id number]  Algernon Huxley, MD Inpatient   06/21/2016 0437 06/21/2016 1758 Full Code [id number]  Harrie Foreman, MD Inpatient      TOTAL TIME TAKING CARE OF THIS PATIENT: 45 minutes.   Note: This dictation was prepared with Dragon dictation along with smaller phrase technology. Any transcriptional errors that result from this process are unintentional.   @[facility] @  on 07/19/2018 at 12:17 PM  Between 7am to 6pm - Pager - [phone]  After 6pm go to [email or web address] - password [email or web address] [facility]  Office  [phone]  CC: Primary care physician; [name], MD

## 2018-07-19 NOTE — Progress Notes (Signed)
Patient is being discharge home today, all discharge instruction provided, iv removed tele removed.

## 2018-07-22 ENCOUNTER — Inpatient Hospital Stay: Payer: Medicare Other | Admitting: Internal Medicine

## 2018-07-22 ENCOUNTER — Ambulatory Visit: Payer: Medicare Other | Admitting: Internal Medicine

## 2018-07-24 ENCOUNTER — Ambulatory Visit: Payer: Medicare Other | Admitting: Anesthesiology

## 2018-07-24 ENCOUNTER — Ambulatory Visit
Admission: RE | Admit: 2018-07-24 | Discharge: 2018-07-24 | Disposition: A | Discharge status: Home / Self Care | Payer: Medicare Other | Source: Ambulatory Visit | Attending: Gastroenterology | Admitting: Gastroenterology

## 2018-07-24 ENCOUNTER — Encounter
Admission: RE | Disposition: A | Discharge status: Home / Self Care | Payer: Self-pay | Source: Ambulatory Visit | Attending: Gastroenterology

## 2018-07-24 DIAGNOSIS — Z7982 Long term (current) use of aspirin: Secondary | ICD-10-CM | POA: Diagnosis not present

## 2018-07-24 DIAGNOSIS — E785 Hyperlipidemia, unspecified: Secondary | ICD-10-CM | POA: Diagnosis not present

## 2018-07-24 DIAGNOSIS — K5790 Diverticulosis of intestine, part unspecified, without perforation or abscess without bleeding: Secondary | ICD-10-CM | POA: Diagnosis not present

## 2018-07-24 DIAGNOSIS — F17210 Nicotine dependence, cigarettes, uncomplicated: Secondary | ICD-10-CM | POA: Diagnosis not present

## 2018-07-24 DIAGNOSIS — K2270 Barrett's esophagus without dysplasia: Secondary | ICD-10-CM | POA: Diagnosis not present

## 2018-07-24 DIAGNOSIS — F329 Major depressive disorder, single episode, unspecified: Secondary | ICD-10-CM | POA: Diagnosis not present

## 2018-07-24 DIAGNOSIS — K219 Gastro-esophageal reflux disease without esophagitis: Secondary | ICD-10-CM | POA: Diagnosis not present

## 2018-07-24 DIAGNOSIS — R195 Other fecal abnormalities: Secondary | ICD-10-CM | POA: Diagnosis not present

## 2018-07-24 DIAGNOSIS — N189 Chronic kidney disease, unspecified: Secondary | ICD-10-CM | POA: Diagnosis not present

## 2018-07-24 DIAGNOSIS — D128 Benign neoplasm of rectum: Secondary | ICD-10-CM | POA: Diagnosis not present

## 2018-07-24 DIAGNOSIS — Z95810 Presence of automatic (implantable) cardiac defibrillator: Secondary | ICD-10-CM | POA: Diagnosis not present

## 2018-07-24 DIAGNOSIS — I110 Hypertensive heart disease with heart failure: Secondary | ICD-10-CM | POA: Insufficient documentation

## 2018-07-24 DIAGNOSIS — K3189 Other diseases of stomach and duodenum: Secondary | ICD-10-CM

## 2018-07-24 DIAGNOSIS — K621 Rectal polyp: Secondary | ICD-10-CM | POA: Diagnosis not present

## 2018-07-24 DIAGNOSIS — K635 Polyp of colon: Secondary | ICD-10-CM | POA: Diagnosis not present

## 2018-07-24 DIAGNOSIS — J439 Emphysema, unspecified: Secondary | ICD-10-CM | POA: Diagnosis not present

## 2018-07-24 DIAGNOSIS — D120 Benign neoplasm of cecum: Secondary | ICD-10-CM | POA: Diagnosis not present

## 2018-07-24 DIAGNOSIS — I2510 Atherosclerotic heart disease of native coronary artery without angina pectoris: Secondary | ICD-10-CM | POA: Diagnosis not present

## 2018-07-24 DIAGNOSIS — I130 Hypertensive heart and chronic kidney disease with heart failure and stage 1 through stage 4 chronic kidney disease, or unspecified chronic kidney disease: Secondary | ICD-10-CM | POA: Diagnosis not present

## 2018-07-24 DIAGNOSIS — Z951 Presence of aortocoronary bypass graft: Secondary | ICD-10-CM | POA: Diagnosis not present

## 2018-07-24 DIAGNOSIS — I509 Heart failure, unspecified: Secondary | ICD-10-CM | POA: Insufficient documentation

## 2018-07-24 DIAGNOSIS — D500 Iron deficiency anemia secondary to blood loss (chronic): Secondary | ICD-10-CM

## 2018-07-24 DIAGNOSIS — Z79899 Other long term (current) drug therapy: Secondary | ICD-10-CM | POA: Diagnosis not present

## 2018-07-24 DIAGNOSIS — I252 Old myocardial infarction: Secondary | ICD-10-CM | POA: Diagnosis not present

## 2018-07-24 DIAGNOSIS — Z85038 Personal history of other malignant neoplasm of large intestine: Secondary | ICD-10-CM | POA: Insufficient documentation

## 2018-07-24 DIAGNOSIS — D122 Benign neoplasm of ascending colon: Secondary | ICD-10-CM | POA: Diagnosis not present

## 2018-07-24 DIAGNOSIS — I739 Peripheral vascular disease, unspecified: Secondary | ICD-10-CM | POA: Diagnosis not present

## 2018-07-24 DIAGNOSIS — J449 Chronic obstructive pulmonary disease, unspecified: Secondary | ICD-10-CM | POA: Diagnosis not present

## 2018-07-24 DIAGNOSIS — D509 Iron deficiency anemia, unspecified: Secondary | ICD-10-CM | POA: Diagnosis not present

## 2018-07-24 DIAGNOSIS — K210 Gastro-esophageal reflux disease with esophagitis: Secondary | ICD-10-CM | POA: Diagnosis not present

## 2018-07-24 DIAGNOSIS — K5730 Diverticulosis of large intestine without perforation or abscess without bleeding: Secondary | ICD-10-CM | POA: Diagnosis not present

## 2018-07-24 HISTORY — DX: Presence of cardiac pacemaker: Z95.0

## 2018-07-24 HISTORY — PX: COLONOSCOPY WITH PROPOFOL: SHX5780

## 2018-07-24 HISTORY — PX: ESOPHAGOGASTRODUODENOSCOPY (EGD) WITH PROPOFOL: SHX5813

## 2018-07-24 HISTORY — DX: Anemia, unspecified: D64.9

## 2018-07-24 LAB — METHYLMALONIC ACID, SERUM: METHYLMALONIC ACID, QUANTITATIVE: 1073 nmol/L — AB (ref 0–378)

## 2018-07-24 SURGERY — COLONOSCOPY WITH PROPOFOL
Anesthesia: General

## 2018-07-24 MED ORDER — SODIUM CHLORIDE 0.9 % IV SOLN
INTRAVENOUS | Status: DC
  Administered 2018-07-24 (×2): via INTRAVENOUS

## 2018-07-24 MED ORDER — PROPOFOL 10 MG/ML IV BOLUS
INTRAVENOUS | Status: DC | PRN
  Administered 2018-07-24: 30 mg via INTRAVENOUS
  Administered 2018-07-24: 80 mg via INTRAVENOUS

## 2018-07-24 MED ORDER — GLYCOPYRROLATE 0.2 MG/ML IJ SOLN
INTRAMUSCULAR | Status: DC | PRN
  Administered 2018-07-24: 0.2 mg via INTRAVENOUS

## 2018-07-24 MED ORDER — PROPOFOL 500 MG/50ML IV EMUL
INTRAVENOUS | Status: AC
  Filled 2018-07-24: qty 50

## 2018-07-24 MED ORDER — LIDOCAINE HCL (CARDIAC) PF 100 MG/5ML IV SOSY
PREFILLED_SYRINGE | INTRAVENOUS | Status: DC | PRN
  Administered 2018-07-24: 100 mg via INTRAVENOUS

## 2018-07-24 MED ORDER — PROPOFOL 500 MG/50ML IV EMUL
INTRAVENOUS | Status: DC | PRN
  Administered 2018-07-24: 150 ug/kg/min via INTRAVENOUS

## 2018-07-24 MED ORDER — LIDOCAINE HCL (PF) 2 % IJ SOLN
INTRAMUSCULAR | Status: AC
  Filled 2018-07-24: qty 10

## 2018-07-24 MED ORDER — GLYCOPYRROLATE 0.2 MG/ML IJ SOLN
INTRAMUSCULAR | Status: AC
  Filled 2018-07-24: qty 1

## 2018-07-24 MED ORDER — EPHEDRINE SULFATE 50 MG/ML IJ SOLN
INTRAMUSCULAR | Status: DC | PRN
  Administered 2018-07-24 (×2): 10 mg via INTRAVENOUS

## 2018-07-24 MED ORDER — PROPOFOL 10 MG/ML IV BOLUS
INTRAVENOUS | Status: AC
  Filled 2018-07-24: qty 20

## 2018-07-24 NOTE — H&P (Signed)
Ashley Antigua, MD [address], [place], [place], [place], [address] [address], [place], [place], [place], [address] Phone: [phone]  Fax: [phone]  Primary Care Physician:  [name], MD   Pre-Procedure History & Physical: HPI:  Ashley [name] is a 67 y.o. female is here for an EGD and colonoscopy.   Past Medical History:  Diagnosis Date  . AICD (automatic cardioverter/defibrillator) present    on right side  . Anemia   . CAD (coronary artery disease)    s/p CABG  . CHF (congestive heart failure) ([place])   . Chronic kidney disease    renal artery stenosis  . Colon cancer ([place])   . COPD (chronic obstructive pulmonary disease) ([place])   . Depression   . Dyspnea   . GERD (gastroesophageal reflux disease)   . Hx of colonic polyps   . Hyperlipidemia   . Hypertension   . Mitral valve disorder    s/p mitral valve repair wth CABG  . Myocardial infarction ([place])   . Peripheral vascular disease ([place])   . Presence of permanent cardiac pacemaker    Pacemaker/ Defibrillator    Past Surgical History:  Procedure Laterality Date  . arm surgery     fracture, has plates and screws  . CABG with mitral valve repair    . COLON SURGERY     colon cancer  . COLONOSCOPY WITH PROPOFOL N/A 10/09/2016   Procedure: COLONOSCOPY WITH PROPOFOL;  Surgeon: Jonathon Bellows, MD;  Location: [facility] ENDOSCOPY;  Service: Endoscopy;  Laterality: N/A;  . CORONARY ARTERY BYPASS GRAFT    . pace maker defib  2016  . PERIPHERAL VASCULAR CATHETERIZATION N/A 10/23/2016   Procedure: Renal Angiography;  Surgeon: Algernon Huxley, MD;  Location: [facility];  Service: Cardiovascular;  Laterality: N/A;  . TOTAL ABDOMINAL HYSTERECTOMY  1999   history of abnormal pap    Prior to Admission medications   Medication Sig Start Date End Date Taking? Authorizing Provider  ADVAIR DISKUS 250-50 MCG/DOSE AEPB INHALE 1 PUFF BY MOUTH TWICE A DAY. RINSE MOUTH AFTER EACH USE 04/09/18  Yes [name],  MD  albuterol (PROVENTIL HFA;VENTOLIN HFA) 108 (90 Base) MCG/ACT inhaler Inhale 2 puffs into the lungs every 6 (six) hours as needed for wheezing. 05/08/18  Yes [name], MD  ALPRAZolam Duanne Moron) 0.5 MG tablet TAKE ONE TABLET BY MOUTH AT BEDTIME AS NEEDED FOR ANXIETY Patient taking differently: Take 0.25-0.5 mg by mouth at bedtime. TAKE ONE TABLET BY MOUTH AT BEDTIME AS NEEDED FOR ANXIETY 05/08/18  Yes [name], MD  amLODipine (NORVASC) 5 MG tablet Take 5 mg by mouth daily.   Yes [provider]  aspirin 81 MG tablet Take 81 mg by mouth daily.   Yes [provider]  atorvastatin (LIPITOR) 40 MG tablet Take 1 tablet (40 mg total) by mouth daily. 05/08/18  Yes [name], MD  carvedilol (COREG) 6.25 MG tablet TAKE ONE TABLET BY MOUTH TWICE DAILY WITH MEAL Patient taking differently: Take 6.25 mg by mouth 2 (two) times daily with a meal.  04/09/18  Yes [name], MD  diphenhydrAMINE (DIPHENHIST) 25 mg capsule Take 25 mg by mouth at bedtime.   Yes [provider]  escitalopram (LEXAPRO) 10 MG tablet TAKE ONE TABLET BY MOUTH EVERY DAY 04/09/18  Yes [name], MD  ferrous sulfate 325 (65 FE) MG tablet Take 1 tablet (325 mg total) by mouth 2 (two) times daily with a  meal. 07/20/18  Yes Gouru, Aruna, MD  furosemide (LASIX) 40 MG tablet Take 1 tablet (40 mg total) by mouth 2 (two) times daily. 07/19/18 07/19/19 Yes Gouru, Aruna, MD  ipratropium-albuterol (DUONEB) 0.5-2.5 (3) MG/3ML SOLN Take 3 mLs by nebulization every 6 (six) hours as needed. 06/28/18  Yes [name], MD  lisinopril (PRINIVIL,ZESTRIL) 20 MG tablet TAKE ONE TABLET BY MOUTH EVERY DAY 04/09/18  Yes [name], MD  omeprazole (PRILOSEC) 40 MG capsule TAKE ONE CAPSULE BY MOUTH EVERY DAY 04/09/18  Yes [name], MD  potassium chloride (K-DUR) 10 MEQ tablet Take 1 tablet (10 mEq total) by mouth daily. 07/19/18  Yes Gouru, Illene Silver, MD  SPIRIVA RESPIMAT 2.5 MCG/ACT AERS ACTIVATE AND INHALE TWO  SPRAYS ORALLY INTO THE LUNGS ONCE DAILY. Patient taking differently: Inhale 1 puff into the lungs 2 (two) times daily.  01/09/18  Yes [name], MD  acetaminophen (TYLENOL) 500 MG tablet Maximum 6 tablets a day. Patient taking differently: Take 500 mg by mouth every 4 (four) hours as needed for fever. Maximum 6 tablets a day. 02/09/15   Rubbie Battiest, RN  nicotine (NICODERM CQ - DOSED IN MG/24 HOURS) 21 mg/24hr patch Place 1 patch (21 mg total) onto the skin daily. Patient not taking: Reported on 07/24/2018 07/20/18   Nicholes Mango, MD  nitroGLYCERIN (NITROSTAT) 0.4 MG SL tablet Place 1 tablet (0.4 mg total) under the tongue every 5 (five) minutes as needed for chest pain. 06/19/16   [name], MD    Allergies as of 07/09/2018 - Review Complete 07/09/2018  Allergen Reaction Noted  . Hydrocodone-acetaminophen Nausea Only 08/25/2014    Family History  Problem Relation Age of Onset  . Arthritis Mother   . Cancer Mother        uterus cancer  . Hyperlipidemia Mother   . Hypertension Mother   . Heart disease Mother   . Diabetes Mother   . Hyperlipidemia Father   . Hypertension Father   . Heart disease Father   . Diabetes Father   . Cancer Sister        ovary cancer  . Diabetes Maternal Grandmother   . Hypertension Maternal Grandmother   . Arthritis Maternal Grandmother   . Hypertension Maternal Grandfather   . Hypertension Paternal Grandmother   . Hypertension Paternal Grandfather   . Heart disease Paternal Grandfather   . Breast cancer Neg Hx     Social History   Socioeconomic History  . Marital status: Widowed    Spouse name: Not on file  . Number of children: 0  . Years of education: 71  . Highest education level: 11th grade  Occupational History  . Occupation: disabled  Social Needs  . Financial resource strain: Not hard at all  . Food insecurity:    Worry: Never true    Inability: Never true  . Transportation needs:    Medical: No    Non-medical: No    Tobacco Use  . Smoking status: Current Every Day Smoker    Packs/day: 0.50    Types: Cigarettes  . Smokeless tobacco: Never Used  . Tobacco comment: 1/2-1 ppd  Substance and Sexual Activity  . Alcohol use: Yes    Alcohol/week: 2.0 standard drinks    Types: 2 Shots of liquor per week  . Drug use: No  . Sexual activity: Yes    Comment: 1 partner  Lifestyle  . Physical activity:    Days per week: 0 days  Minutes per session: Not on file  . Stress: To some extent  Relationships  . Social connections:    Talks on phone: Once a week    Gets together: Once a week    Attends religious service: Never    Active member of club or organization: No    Attends meetings of clubs or organizations: Never    Relationship status: Living with partner  . Intimate partner violence:    Fear of current or ex partner: No    Emotionally abused: No    Physically abused: No    Forced sexual activity: No  Other Topics Concern  . Not on file  Social History Narrative  . Not on file    Review of Systems: See HPI, otherwise negative ROS  Physical Exam: BP 126/79   Pulse 74   Temp (!) 97.5 F (36.4 C) (Tympanic)   Resp 16   Ht 5\' 4"  (1.626 m)   Wt 76.2 kg   SpO2 100%   BMI 28.84 kg/m  General:   Alert,  pleasant and cooperative in NAD Head:  Normocephalic and atraumatic. Neck:  Supple; no masses or thyromegaly. Lungs:  Clear throughout to auscultation, normal respiratory effort.    Heart:  +S1, +S2, Regular rate and rhythm, No edema. Abdomen:  Soft, nontender and nondistended. Normal bowel sounds, without guarding, and without rebound.   Neurologic:  Alert and  oriented x4;  grossly normal neurologically.  Impression/Plan: Ashley [name] is here for a colonoscopy to be performed for iron deficiency anemia  Risks, benefits, limitations, and alternatives regarding  colonoscopy have been reviewed with the patient.  Questions have been answered.  All parties agreeable.   Virgel Manifold, MD  07/24/2018, 10:04 AM

## 2018-07-24 NOTE — Anesthesia Postprocedure Evaluation (Signed)
Anesthesia Post Note  Patient: KURT AZIMI  Procedure(s) Performed: COLONOSCOPY WITH PROPOFOL (N/A ) ESOPHAGOGASTRODUODENOSCOPY (EGD) WITH PROPOFOL (N/A )  Patient location during evaluation: Endoscopy Anesthesia Type: General Level of consciousness: awake and alert Pain management: pain level controlled Vital Signs Assessment: post-procedure vital signs reviewed and stable Respiratory status: spontaneous breathing, nonlabored ventilation, respiratory function stable and patient connected to nasal cannula oxygen Cardiovascular status: blood pressure returned to baseline and stable Postop Assessment: no apparent nausea or vomiting Anesthetic complications: no     Last Vitals:  Vitals:   07/24/18 0931 07/24/18 1120  BP: 126/79 (!) 90/56  Pulse: 74 69  Resp: 16 18  Temp: (!) 36.4 C (!) 36.1 C  SpO2: 100% 99%    Last Pain:  Vitals:   07/24/18 1145  TempSrc:   PainSc: 0-No pain                 Martha Clan

## 2018-07-24 NOTE — Op Note (Signed)
[facility] Gastroenterology Patient Name: Ashley [name] Procedure Date: 07/24/2018 10:15 AM MRN: [id number] Account #: [id number] Date of Birth: 1951-03-29 Admit Type: Outpatient Age: 67 Room: [facility] ENDO ROOM 2 Gender: Female Note Status: Finalized Procedure:            Upper GI endoscopy Indications:          Iron deficiency anemia Providers:            Twinkle Sockwell B. Bonna Gains MD, MD Referring MD:         Deborra Medina, MD (Referring MD) Medicines:            Monitored Anesthesia Care Complications:        No immediate complications. Procedure:            Pre-Anesthesia Assessment:                       - Prior to the procedure, a History and Physical was                        performed, and patient medications, allergies and                        sensitivities were reviewed. The patient's tolerance of                        previous anesthesia was reviewed.                       - The risks and benefits of the procedure and the                        sedation options and risks were discussed with the                        patient. All questions were answered and informed                        consent was obtained.                       - Patient identification and proposed procedure were                        verified prior to the procedure by the physician, the                        nurse, the anesthesiologist, the anesthetist and the                        technician. The procedure was verified in the procedure                        room.                       - ASA Grade Assessment: II - A patient with mild                        systemic disease.  After obtaining informed consent, the endoscope was                        passed under direct vision. Throughout the procedure,                        the patient's blood pressure, pulse, and oxygen                        saturations were monitored continuously. The Endoscope               was introduced through the mouth, and advanced to the                        second part of duodenum. The upper GI endoscopy was                        accomplished with ease. The patient tolerated the                        procedure well. Findings:      The esophagus and gastroesophageal junction were examined with white       light. There were esophageal mucosal changes classified as Barrett's       stage C0-M1 per Prague criteria. These changes involved the mucosa along       an irregular Z-line (40 cm from the incisors). Scattered islands of       salmon-colored mucosa were present from 39 to 40 cm. The maximum       longitudinal extent of these esophageal mucosal changes was 1 cm in       length. Mucosa was biopsied with a cold forceps for histology in 4       quadrants at intervals of 1 cm.      Patchy mildly erythematous mucosa without bleeding was found in the       gastric antrum. Biopsies were taken with a cold forceps for histology.       Biopsies were obtained in the gastric body, at the incisura and in the       gastric antrum with cold forceps for histology.      The duodenal bulb, second portion of the duodenum and examined duodenum       were normal. Biopsies for histology were taken with a cold forceps for       evaluation of celiac disease. Impression:           - Esophageal mucosal changes classified as Barrett's                        stage C0-M1 per Prague criteria. Biopsied.                       - Erythematous mucosa in the antrum. Biopsied.                       - Normal duodenal bulb, second portion of the duodenum                        and examined duodenum. Biopsied.                       - Biopsies  were obtained in the gastric body, at the                        incisura and in the gastric antrum. Recommendation:       - Await pathology results.                       - Discharge patient to home (with escort).                       - Advance diet  as tolerated.                       - Continue present medications.                       - Patient has a contact number available for                        emergencies. The signs and symptoms of potential                        delayed complications were discussed with the patient.                        Return to normal activities tomorrow. Written discharge                        instructions were provided to the patient.                       - Discharge patient to home (with escort).                       - The findings and recommendations were discussed with                        the patient.                       - The findings and recommendations were discussed with                        the patient's family. Procedure Code(s):    --- Professional ---                       [phone], Esophagogastroduodenoscopy, flexible, transoral;                        with biopsy, single or multiple Diagnosis Code(s):    --- Professional ---                       K22.70, Barrett's esophagus without dysplasia                       K31.89, Other diseases of stomach and duodenum                       D50.9, Iron deficiency anemia, unspecified CPT copyright 2017 American Medical Association. All rights reserved. The codes documented in this report are preliminary and upon coder review may  be revised to meet  current compliance requirements.  [name], MD Margretta Sidle B. Bonna Gains MD, MD 07/24/2018 10:42:19 AM This report has been signed electronically. Number of Addenda: 0 Note Initiated On: 07/24/2018 10:15 AM Estimated Blood Loss: Estimated blood loss: none.      [facility]

## 2018-07-24 NOTE — Anesthesia Preprocedure Evaluation (Signed)
Anesthesia Evaluation  Patient identified by MRN, date of birth, ID band Patient awake    Reviewed: Allergy & Precautions, H&P , NPO status , Patient's Chart, lab work & pertinent test results, reviewed documented beta blocker date and time   History of Anesthesia Complications Negative for: history of anesthetic complications  Airway Mallampati: I   Neck ROM: full    Dental  (+) Missing, Dental Advidsory Given, Poor Dentition   Pulmonary neg pulmonary ROS, shortness of breath and with exertion, neg sleep apnea, COPD, neg recent URI, Current Smoker,           Cardiovascular hypertension, (-) angina+ CAD, + Past MI, + CABG, + Peripheral Vascular Disease and +CHF  (-) Cardiac Stents negative cardio ROS  (-) dysrhythmias + pacemaker + Cardiac Defibrillator + Valvular Problems/Murmurs (s/p MV repair)      Neuro/Psych PSYCHIATRIC DISORDERS Depression negative neurological ROS     GI/Hepatic negative GI ROS, Neg liver ROS, GERD  ,  Endo/Other  negative endocrine ROS  Renal/GU CRFRenal disease  negative genitourinary   Musculoskeletal   Abdominal   Peds  Hematology negative hematology ROS (+)   Anesthesia Other Findings Past Medical History: No date: CAD (coronary artery disease)     Comment: s/p CABG No date: Colon cancer ([place]) No date: Depression No date: GERD (gastroesophageal reflux disease) No date: Hx of colonic polyps No date: Hyperlipidemia No date: Hypertension No date: Mitral valve disorder     Comment: s/p mitral valve repair wth CABG Past Surgical History: No date: arm surgery     Comment: fracture, has plates and screws No date: CABG with mitral valve repair No date: CARDIAC VALVE REPLACEMENT No date: COLON SURGERY     Comment: colon cancer No date: CORONARY ARTERY BYPASS GRAFT 2016: pace maker defib 1999: TOTAL ABDOMINAL HYSTERECTOMY     Comment: history of abnormal pap   Reproductive/Obstetrics negative OB ROS                             Anesthesia Physical  Anesthesia Plan  ASA: IV  Anesthesia Plan: General   Post-op Pain Management:    Induction: Intravenous  PONV Risk Score and Plan: 2 and Propofol infusion and TIVA  Airway Management Planned: Nasal Cannula and Natural Airway  Additional Equipment:   Intra-op Plan:   Post-operative Plan:   Informed Consent: I have reviewed the patients History and Physical, chart, labs and discussed the procedure including the risks, benefits and alternatives for the proposed anesthesia with the patient or authorized representative who has indicated his/her understanding and acceptance.   Dental Advisory Given  Plan Discussed with: CRNA  Anesthesia Plan Comments:         Anesthesia Quick Evaluation

## 2018-07-24 NOTE — Transfer of Care (Signed)
Immediate Anesthesia Transfer of Care Note  [name]  Procedure(s) Performed: COLONOSCOPY WITH PROPOFOL (N/A ) ESOPHAGOGASTRODUODENOSCOPY (EGD) WITH PROPOFOL (N/A )  Patient Location: Endoscopy Unit  Anesthesia Type:General  Level of Consciousness: awake and alert   Airway & Oxygen Therapy: Patient Spontanous Breathing and Patient connected to nasal cannula oxygen  Post-op Assessment: Report given to RN and Post -op Vital signs reviewed and stable  Post vital signs: Reviewed and stable  Last Vitals:  Vitals Value Taken Time  BP 89/41 07/24/2018 11:23 AM  Temp 36.1 C 07/24/2018 11:20 AM  Pulse 71 07/24/2018 11:23 AM  Resp 15 07/24/2018 11:23 AM  SpO2 100 % 07/24/2018 11:23 AM  Vitals shown include unvalidated device data.  Last Pain:  Vitals:   07/24/18 1121  TempSrc:   PainSc: 0-No pain         Complications: No apparent anesthesia complications

## 2018-07-24 NOTE — Anesthesia Post-op Follow-up Note (Signed)
Anesthesia QCDR form completed.        

## 2018-07-24 NOTE — Op Note (Signed)
[facility] Patient Name: Ashley [name] Procedure Date: 07/24/2018 10:15 AM MRN: [id number] Account #: [id number] Date of Birth: 29-Aug-1951 Admit Type: Outpatient Age: 67 Room: [facility] ENDO ROOM 2 Gender: Female Note Status: Finalized Procedure:            Colonoscopy Indications:          Iron deficiency anemia Providers:            Nazario Russom B. Bonna Gains MD, MD Referring MD:         Deborra Medina, MD (Referring MD) Medicines:            Monitored Anesthesia Care Complications:        No immediate complications. Procedure:            Pre-Anesthesia Assessment:                       - ASA Grade Assessment: II - A patient with mild                        systemic disease.                       - Prior to the procedure, a History and Physical was                        performed, and patient medications, allergies and                        sensitivities were reviewed. The patient's tolerance of                        previous anesthesia was reviewed.                       - The risks and benefits of the procedure and the                        sedation options and risks were discussed with the                        patient. All questions were answered and informed                        consent was obtained.                       - Patient identification and proposed procedure were                        verified prior to the procedure by the physician, the                        nurse, the anesthesiologist, the anesthetist and the                        technician. The procedure was verified in the procedure                        room.                       After obtaining  informed consent, the colonoscope was                        passed under direct vision. Throughout the procedure,                        the patient's blood pressure, pulse, and oxygen                        saturations were monitored continuously. The   Colonoscope was introduced through the anus and                        advanced to the the cecum, identified by appendiceal                        orifice and ileocecal valve. The colonoscopy was                        performed with ease. The patient tolerated the                        procedure well. The quality of the bowel preparation                        was good. Findings:      The perianal and digital rectal examinations were normal.      Three sessile polyps were found in the ascending colon and cecum. The       polyps were 2 to 4 mm in size. These polyps were removed with a cold       biopsy forceps. Resection and retrieval were complete.      A 5 mm polyp was found in the ascending colon. The polyp was sessile.       The polyp was removed with a cold snare. Resection and retrieval were       complete.      Three sessile polyps were found in the rectum. The polyps were 4 to 5 mm       in size. These polyps were removed with a cold snare. Resection and       retrieval were complete.      Multiple diverticula were found in the sigmoid colon.      The exam was otherwise without abnormality.      The rectum, sigmoid colon, descending colon, transverse colon, ascending       colon and cecum appeared normal.      The retroflexed view of the distal rectum and anal verge was normal and       showed no anal or rectal abnormalities. Impression:           - Three 2 to 4 mm polyps in the ascending colon and in                        the cecum, removed with a cold biopsy forceps. Resected                        and retrieved.                       - One 5 mm polyp in the ascending colon, removed with a  cold snare. Resected and retrieved.                       - Three 4 to 5 mm polyps in the rectum, removed with a                        cold snare. Resected and retrieved.                       - Diverticulosis in the sigmoid colon.                       - The  examination was otherwise normal.                       - The rectum, sigmoid colon, descending colon,                        transverse colon, ascending colon and cecum are normal.                       - The distal rectum and anal verge are normal on                        retroflexion view. Recommendation:       - To visualize the small bowel, perform video capsule                        endoscopy at appointment to be scheduled.                       - Discharge patient to home (with escort).                       - High fiber diet.                       - Advance diet as tolerated.                       - Continue present medications.                       - Await pathology results.                       - Repeat colonoscopy in 5 years.                       - The findings and recommendations were discussed with                        the patient.                       - The findings and recommendations were discussed with                        the patient's family.                       - Return to primary care physician as previously  scheduled. Procedure Code(s):    --- Professional ---                       [id number], Colonoscopy, flexible; with removal of tumor(s),                        polyp(s), or other lesion(s) by snare technique                       [id number], 47, Colonoscopy, flexible; with biopsy, single                        or multiple Diagnosis Code(s):    --- Professional ---                       D12.2, Benign neoplasm of ascending colon                       D12.0, Benign neoplasm of cecum                       K62.1, Rectal polyp                       D50.9, Iron deficiency anemia, unspecified                       K57.30, Diverticulosis of large intestine without                        perforation or abscess without bleeding CPT copyright 2017 American Medical Association. All rights reserved. The codes documented in this report are  preliminary and upon coder review may  be revised to meet current compliance requirements.  [name], MD Margretta Sidle B. Bonna Gains MD, MD 07/24/2018 11:28:16 AM This report has been signed electronically. Number of Addenda: 0 Note Initiated On: 07/24/2018 10:15 AM Scope Withdrawal Time: 0 hours 22 minutes 44 seconds  Total Procedure Duration: 0 hours 29 minutes 36 seconds  Estimated Blood Loss: Estimated blood loss: none.      [facility]

## 2018-07-25 ENCOUNTER — Encounter: Payer: Self-pay | Admitting: Gastroenterology

## 2018-07-26 LAB — BLOOD GAS, VENOUS
ACID-BASE DEFICIT: 7.8 mmol/L — AB (ref 0.0–2.0)
BICARBONATE: 18.8 mmol/L — AB (ref 20.0–28.0)
O2 SAT: 48.4 %
PATIENT TEMPERATURE: 37
pCO2, Ven: 42 mmHg — ABNORMAL LOW (ref 44.0–60.0)
pH, Ven: 7.26 (ref 7.250–7.430)

## 2018-07-27 LAB — SURGICAL PATHOLOGY

## 2018-07-29 ENCOUNTER — Ambulatory Visit (INDEPENDENT_AMBULATORY_CARE_PROVIDER_SITE_OTHER): Payer: Medicare Other | Admitting: Internal Medicine

## 2018-07-29 ENCOUNTER — Encounter: Payer: Self-pay | Admitting: Internal Medicine

## 2018-07-29 ENCOUNTER — Telehealth: Payer: Self-pay

## 2018-07-29 VITALS — BP 88/58 | HR 75 | Temp 97.9°F | Resp 15 | Ht 64.0 in | Wt 166.4 lb

## 2018-07-29 DIAGNOSIS — Z720 Tobacco use: Secondary | ICD-10-CM | POA: Diagnosis not present

## 2018-07-29 DIAGNOSIS — I5021 Acute systolic (congestive) heart failure: Secondary | ICD-10-CM

## 2018-07-29 DIAGNOSIS — Z23 Encounter for immunization: Secondary | ICD-10-CM | POA: Diagnosis not present

## 2018-07-29 DIAGNOSIS — I10 Essential (primary) hypertension: Secondary | ICD-10-CM

## 2018-07-29 DIAGNOSIS — Z09 Encounter for follow-up examination after completed treatment for conditions other than malignant neoplasm: Secondary | ICD-10-CM

## 2018-07-29 DIAGNOSIS — D509 Iron deficiency anemia, unspecified: Secondary | ICD-10-CM

## 2018-07-29 DIAGNOSIS — E538 Deficiency of other specified B group vitamins: Secondary | ICD-10-CM

## 2018-07-29 LAB — CBC WITH DIFFERENTIAL/PLATELET
BASOS ABS: 0.1 10*3/uL (ref 0.0–0.1)
Basophils Relative: 1.4 % (ref 0.0–3.0)
Eosinophils Absolute: 0.2 10*3/uL (ref 0.0–0.7)
Eosinophils Relative: 2.1 % (ref 0.0–5.0)
HEMATOCRIT: 28.9 % — AB (ref 36.0–46.0)
HEMOGLOBIN: 9.7 g/dL — AB (ref 12.0–15.0)
LYMPHS PCT: 20.7 % (ref 12.0–46.0)
Lymphs Abs: 1.7 10*3/uL (ref 0.7–4.0)
MCHC: 33.4 g/dL (ref 30.0–36.0)
MCV: 84.6 fl (ref 78.0–100.0)
Monocytes Absolute: 0.6 10*3/uL (ref 0.1–1.0)
Monocytes Relative: 7.4 % (ref 3.0–12.0)
Neutro Abs: 5.8 10*3/uL (ref 1.4–7.7)
Neutrophils Relative %: 68.4 % (ref 43.0–77.0)
Platelets: 354 10*3/uL (ref 150.0–400.0)
RBC: 3.42 Mil/uL — AB (ref 3.87–5.11)
RDW: 16.8 % — ABNORMAL HIGH (ref 11.5–15.5)
WBC: 8.4 10*3/uL (ref 4.0–10.5)

## 2018-07-29 LAB — BASIC METABOLIC PANEL
BUN: 19 mg/dL (ref 6–23)
CHLORIDE: 99 meq/L (ref 96–112)
CO2: 29 meq/L (ref 19–32)
Calcium: 9.8 mg/dL (ref 8.4–10.5)
Creatinine, Ser: 1.38 mg/dL — ABNORMAL HIGH (ref 0.40–1.20)
GFR: 40.75 mL/min — ABNORMAL LOW (ref >60.00)
GLUCOSE: 118 mg/dL — AB (ref 70–99)
Potassium: 4.1 mEq/L (ref 3.5–5.1)
Sodium: 138 mEq/L (ref 135–145)

## 2018-07-29 MED ORDER — CYANOCOBALAMIN 1000 MCG/ML IJ SOLN: INTRAMUSCULAR | 0 refills | Status: DC

## 2018-07-29 MED ORDER — "SYRINGE 25G X 1"" 3 ML MISC": 0 refills | Status: DC

## 2018-07-29 MED ORDER — CYANOCOBALAMIN 1000 MCG/ML IJ SOLN
1000.0000 ug | Freq: Once | INTRAMUSCULAR | Status: AC
  Administered 2018-07-29: 1000 ug via INTRAMUSCULAR

## 2018-07-29 NOTE — Telephone Encounter (Signed)
Copied from [place] [phone]. Topic: General - Other >> Jul 17, 2018  8:52 AM Margot Ables wrote: Reason for CRM: Please have the correction on RX for nebulizer initialed and dated otherwise the RX cannot be accepted for Medicare. Or write new RX and send. Fax # [phone] ([facility]) >> Jul 29, 2018  2:39 PM Carolyn Stare wrote:  Vedia Pereyra said pt plan b will cover ipratropium-albuterol (DUONEB) 0.5-2.5 (3) MG/3ML Trixie Dredge with [facility] said they contacted [facility] and they don't file part b. So the RX will need to be sent to a different pharmacy that will file part b or pay out of pocket  >> Jul 29, 2018  2:40 PM Carolyn Stare wrote:   Can call Ria Comment at [facility]  back at [phone]

## 2018-07-29 NOTE — Progress Notes (Signed)
Subjective:  Patient ID: Ashley [name], female    DOB: [birth year]  Age: 67 y.o. MRN: [id number]  CC: The primary encounter diagnosis was B12 deficiency. Diagnoses of Iron deficiency anemia, unspecified iron deficiency anemia type, Essential hypertension, Need for influenza vaccination, Acute systolic congestive heart failure ([place]), Tobacco abuse, and Hospital discharge follow-up were also pertinent to this visit.  HPI Ashley [name] presents for hospital follow up,  Patient was admitted to [facility] with acute hypoxic respiratory failure secondary to COPD and acute on chronic heart failure, aggravated by worsening anemia,  hgb 8.9 on admission Sept  11 with sudden onset of symptoms that morning accompanide by a 2 lb weight gain  .  She was treated with BIPAP and IV lasix,  Along with nebulized bronchodilators, and discharged home on Sept 13.    Treated in ED on aug 21 for same and sent home   History of  MV repair with CABG,  AICD with pacer as well    Anemia, iron deficient , diagnosed July 24, hgb 10.7 ,  FOBT positive.  Taking iron daily.    hgb dropped to 8.2 during recent hospitalization.Was not transfused despite hgb being 8.2 and acute systolic heart failure . Iron stores still low.   Since discharge she underwent EGD and Colonoscopy with no source  Of blood loss found  Several < 1 cm polyps retrieved,  Barrett's esophagus noted .   Told to repeat colon in 5 years? (prior to biopsy)  . Taking iron daily . Stools are still balck and tarry ,  Have been for the past 3 weeks.   Seeing   GI for follow u,p, capsule study to eval smal intestine  Getting B12 orally  Still smoking,  Averages  8 cigs  Daily.   Lab Results  Component Value Date   VITAMINB12 249 07/01/2018     Lab Results  Component Value Date   HGBA1C 6.2 05/06/2018    currenlty taking 40 mg twice daily sience discharge on Saturday  14th.  Weight is stable.    Outpatient Medications Prior to Visit  Medication Sig  Dispense Refill  . acetaminophen (TYLENOL) 500 MG tablet Maximum 6 tablets a day. (Patient taking differently: Take 500 mg by mouth every 4 (four) hours as needed for fever. Maximum 6 tablets a day.) 42 tablet 0  . ADVAIR DISKUS 250-50 MCG/DOSE AEPB INHALE 1 PUFF BY MOUTH TWICE A DAY. RINSE MOUTH AFTER EACH USE 180 each 1  . albuterol (PROVENTIL HFA;VENTOLIN HFA) 108 (90 Base) MCG/ACT inhaler Inhale 2 puffs into the lungs every 6 (six) hours as needed for wheezing. 18 g 2  . ALPRAZolam (XANAX) 0.5 MG tablet TAKE ONE TABLET BY MOUTH AT BEDTIME AS NEEDED FOR ANXIETY (Patient taking differently: Take 0.25-0.5 mg by mouth at bedtime. TAKE ONE TABLET BY MOUTH AT BEDTIME AS NEEDED FOR ANXIETY) 30 tablet 5  . aspirin 81 MG tablet Take 81 mg by mouth daily.    [name] atorvastatin (LIPITOR) 40 MG tablet Take 1 tablet (40 mg total) by mouth daily. 90 tablet 1  . carvedilol (COREG) 6.25 MG tablet TAKE ONE TABLET BY MOUTH TWICE DAILY WITH MEAL (Patient taking differently: Take 6.25 mg by mouth 2 (two) times daily with a meal. ) 180 tablet 1  . clopidogrel (PLAVIX) 75 MG tablet     . diphenhydrAMINE (DIPHENHIST) 25 mg capsule Take 25 mg by mouth at bedtime.    [name] escitalopram (LEXAPRO) 10 MG tablet TAKE ONE  TABLET BY MOUTH EVERY DAY 90 tablet 1  . ferrous sulfate 325 (65 FE) MG tablet Take 1 tablet (325 mg total) by mouth 2 (two) times daily with a meal.  3  . furosemide (LASIX) 40 MG tablet Take 1 tablet (40 mg total) by mouth 2 (two) times daily. 60 tablet 0  . ipratropium-albuterol (DUONEB) 0.5-2.5 (3) MG/3ML SOLN Take 3 mLs by nebulization every 6 (six) hours as needed. 360 mL 1  . lisinopril (PRINIVIL,ZESTRIL) 20 MG tablet TAKE ONE TABLET BY MOUTH EVERY DAY 90 tablet 1  . nitroGLYCERIN (NITROSTAT) 0.4 MG SL tablet Place 1 tablet (0.4 mg total) under the tongue every 5 (five) minutes as needed for chest pain. 30 tablet 3  . omeprazole (PRILOSEC) 40 MG capsule TAKE ONE CAPSULE BY MOUTH EVERY DAY 90 capsule 1  .  potassium chloride (K-DUR) 10 MEQ tablet Take 1 tablet (10 mEq total) by mouth daily. 30 tablet 0  . SPIRIVA RESPIMAT 2.5 MCG/ACT AERS ACTIVATE AND INHALE TWO SPRAYS ORALLY INTO THE LUNGS ONCE DAILY. (Patient taking differently: Inhale 1 puff into the lungs 2 (two) times daily. ) 4 g 0  . amLODipine (NORVASC) 5 MG tablet Take 5 mg by mouth daily.    . nicotine (NICODERM CQ - DOSED IN MG/24 HOURS) 21 mg/24hr patch Place 1 patch (21 mg total) onto the skin daily. (Patient not taking: Reported on 07/24/2018) 28 patch 0   No facility-administered medications prior to visit.     Review of Systems;  Patient denies headache, fevers, malaise, unintentional weight loss, skin rash, eye pain, sinus congestion and sinus pain, sore throat, dysphagia,  hemoptysis , cough, dyspnea, wheezing, chest pain, palpitations, orthopnea, edema, abdominal pain, nausea, melena, diarrhea, constipation, flank pain, dysuria, hematuria, urinary  Frequency, nocturia, numbness, tingling, seizures,  Focal weakness, Loss of consciousness,  Tremor, insomnia, depression, anxiety, and suicidal ideation.      Objective:  BP (!) 88/58 (BP Location: Left Arm, Patient Position: Sitting, Cuff Size: Normal)   Pulse 75   Temp 97.9 F (36.6 C) (Oral)   Resp 15   Ht 5\' 4"  (1.626 m)   Wt 166 lb 6.4 oz (75.5 kg)   SpO2 98%   BMI 28.56 kg/m   BP Readings from Last 3 Encounters:  07/29/18 (!) 88/58  07/24/18 (!) 90/56  07/19/18 117/72    Wt Readings from Last 3 Encounters:  07/29/18 166 lb 6.4 oz (75.5 kg)  07/24/18 168 lb (76.2 kg)  07/19/18 167 lb 3.2 oz (75.8 kg)    General appearance: alert, cooperative and appears stated age Ears: normal TM's and external ear canals both ears Throat: lips, mucosa, and tongue normal; teeth and gums normal Neck: no adenopathy, no carotid bruit, supple, symmetrical, trachea midline and thyroid not enlarged, symmetric, no tenderness/mass/nodules Back: symmetric, no curvature. ROM normal. No  CVA tenderness. Lungs: clear to auscultation bilaterally Heart: regular rate and rhythm, S1, S2 normal, no murmur, click, rub or gallop Abdomen: soft, non-tender; bowel sounds normal; no masses,  no organomegaly Pulses: 2+ and symmetric Skin: Skin color, texture, turgor normal. No rashes or lesions Lymph nodes: Cervical, supraclavicular, and axillary nodes normal.  Lab Results  Component Value Date   HGBA1C 6.2 05/06/2018   HGBA1C 5.7 06/21/2016    Lab Results  Component Value Date   CREATININE 1.38 (H) 07/29/2018   CREATININE 1.28 (H) 07/18/2018   CREATININE 1.23 (H) 07/17/2018    Lab Results  Component Value Date   WBC 8.4 07/29/2018  HGB 9.7 (L) 07/29/2018   HCT 28.9 (L) 07/29/2018   PLT 354.0 07/29/2018   GLUCOSE 118 (H) 07/29/2018   CHOL 222 (H) 05/06/2018   TRIG 276 (H) 05/06/2018   HDL 41 (L) 05/06/2018   LDLDIRECT 155.0 06/06/2016   LDLCALC 139 (H) 05/06/2018   ALT 57 (H) 07/17/2018   AST 90 (H) 07/17/2018   NA 138 07/29/2018   K 4.1 07/29/2018   CL 99 07/29/2018   CREATININE 1.38 (H) 07/29/2018   BUN 19 07/29/2018   CO2 29 07/29/2018   TSH 1.791 07/17/2018   INR 1.1 08/19/2014   HGBA1C 6.2 05/06/2018    Dg Chest Port 1 View  Result Date: 07/17/2018 CLINICAL DATA:  Acute presentation with respiratory distress. EXAM: PORTABLE CHEST 1 VIEW COMPARISON:  06/26/2018 FINDINGS: Previous median sternotomy and CABG. Pacemaker/AICD appears the same. Chronic cardiomegaly. Acute pulmonary edema with interstitial and alveolar components. No measurable effusion. No acute bone finding. IMPRESSION: Acute pulmonary edema. Electronically Signed   By: Nelson Chimes M.D.   On: 07/17/2018 09:57    Assessment & Plan:   Problem List Items Addressed This Visit    Acute CHF (congestive heart failure) ([place])    Etiology may have been drop in hgb.  Troponin was 0.03.  She was not transfused.  Currently compensated .  Taking furosemide 40 mg bid since discharge.       B12  deficiency anemia    Multifactorial with iron deficiency and as yet to be determined GI source of blood loss  B12 also < 300 confirmed with elevated MMA done during admission .  Has Barrett's esophagus,  Numerous polyps removed.d  .  GI work up continues,  Will order IV iron if no improvement since discharge. Checking intrinsic factor ab and will start parenteral .first dose given today   Lab Results  Component Value Date   WBC 8.4 07/29/2018   HGB 9.7 (L) 07/29/2018   HCT 28.9 (L) 07/29/2018   MCV 84.6 07/29/2018   PLT 354.0 07/29/2018   Lab Results  Component Value Date   IRON 17 (L) 07/18/2018   TIBC 428 07/18/2018   FERRITIN 17 07/01/2018   Lab Results  Component Value Date   VITAMINB12 249 07/01/2018         Relevant Medications   cyanocobalamin (,VITAMIN B-12,) 1000 MCG/ML injection   cyanocobalamin ((VITAMIN B-12)) injection 1,000 mcg (Completed)   Hospital discharge follow-up    Patient is stable post discharge and has no new issues or questions about discharge plans at the visit today for hospital follow up. All labs , imaging studies and progress notes from admission were reviewed with patient today        HTN (hypertension) (Chronic)   Relevant Orders   Basic metabolic panel (Completed)   Tobacco abuse    Risks of continued tobacco use were discussed. She is not contemplating quitting at the present time due to multiple home stressors  But has reduce use to < 1/2 pack daily       Other Visit Diagnoses    B12 deficiency    -  Primary   Relevant Medications   cyanocobalamin ((VITAMIN B-12)) injection 1,000 mcg (Completed)   Other Relevant Orders   Intrinsic Factor Antibodies   Need for influenza vaccination       Relevant Orders   Flu Vaccine QUAD 6+ mos PF IM (Fluarix Quad PF) (Completed)      I have discontinued Mirza A. Mickelson "Debbie"'s nicotine. I  am also having her start on cyanocobalamin and SYRINGE 3CC/25GX1". Additionally, I am having her  maintain her aspirin, acetaminophen, nitroGLYCERIN, SPIRIVA RESPIMAT, ADVAIR DISKUS, carvedilol, omeprazole, escitalopram, lisinopril, atorvastatin, ALPRAZolam, albuterol, diphenhydrAMINE, ipratropium-albuterol, amLODipine, ferrous sulfate, furosemide, potassium chloride, and clopidogrel. We administered cyanocobalamin.  Meds ordered this encounter  Medications  . cyanocobalamin (,VITAMIN B-12,) 1000 MCG/ML injection    Sig: Inject 1 mL weekly into the muscle for 3 weeks,  Then monthly thereafter    Dispense:  10 mL    Refill:  0  . Syringe/Needle, Disp, (SYRINGE 3CC/25GX1") 25G X 1" 3 ML MISC    Sig: Use for b12 injections    Dispense:  50 each    Refill:  0  . cyanocobalamin ((VITAMIN B-12)) injection 1,000 mcg    Medications Discontinued During This Encounter  Medication Reason  . nicotine (NICODERM CQ - DOSED IN MG/24 HOURS) 21 mg/24hr patch Patient has not taken in last 30 days    Follow-up: Return in about 3 months (around 10/28/2018) for follow up diabetes.   [name], MD

## 2018-07-29 NOTE — Patient Instructions (Addendum)
Take your iron pill  once daily with orange juice to improve  absorption   your B12 level  Was  Low in August .  YOu received an injection today , and should have one every week for 4 weeks. I will send the medicine to your pharmacy   If Your intrinisic factor antibody is positive, which means that  you cannot absorb the b12 from your stomach.  So that means you will need to continue supplementing your  B12 level with injections for life,  Not switch to oral tablets. [name]

## 2018-07-29 NOTE — Progress Notes (Deleted)
Patient ID: Ashley [name], female    DOB: September 13, 1951, 67 y.o.   MRN: [id number]  HPI  Ashley [name] is a 67 y/o female with a history of CAD (CABG), hyperlipidemia, HTN, CKD, GERD, depression, COPD, PVD, current tobacco use and chronic heart failure.   Echo report from 06/21/16 reviewed and showed an EF of 55-60% along with mild AR and moderate MR.   Admitted 07/17/18 due to acute HF exacerbation. Cardiology consult obtained. Was initially given IV lasix and then transitioned to oral diuretics. Discharged after 2 days. Was in the ED 06/26/18 due to HF exacerbation. IV lasix given and she diuresed ~ 1 liter. Released the same day. Was in the ED 05/28/18 due to alcohol intoxication where she was treated and released.   She presents today for a follow-up visit with a chief complaint of Last echo was done 2017.  Past Medical History:  Diagnosis Date  . AICD (automatic cardioverter/defibrillator) present    on right side  . Anemia   . CAD (coronary artery disease)    s/p CABG  . CHF (congestive heart failure) ([place])   . Chronic kidney disease    renal artery stenosis  . Colon cancer ([place])   . COPD (chronic obstructive pulmonary disease) ([place])   . Depression   . Dyspnea   . GERD (gastroesophageal reflux disease)   . Hx of colonic polyps   . Hyperlipidemia   . Hypertension   . Mitral valve disorder    s/p mitral valve repair wth CABG  . Myocardial infarction ([place])   . Peripheral vascular disease ([place])   . Presence of permanent cardiac pacemaker    Pacemaker/ Defibrillator   Past Surgical History:  Procedure Laterality Date  . arm surgery     fracture, has plates and screws  . CABG with mitral valve repair    . COLON SURGERY     colon cancer  . COLONOSCOPY WITH PROPOFOL N/A 10/09/2016   Procedure: COLONOSCOPY WITH PROPOFOL;  Surgeon: Jonathon Bellows, MD;  Location: [facility];  Service: Endoscopy;  Laterality: N/A;  . COLONOSCOPY WITH PROPOFOL N/A 07/24/2018   Procedure: COLONOSCOPY  WITH PROPOFOL;  Surgeon: Virgel Manifold, MD;  Location: [facility];  Service: Endoscopy;  Laterality: N/A;  . CORONARY ARTERY BYPASS GRAFT    . ESOPHAGOGASTRODUODENOSCOPY (EGD) WITH PROPOFOL N/A 07/24/2018   Procedure: ESOPHAGOGASTRODUODENOSCOPY (EGD) WITH PROPOFOL;  Surgeon: Virgel Manifold, MD;  Location: [facility];  Service: Endoscopy;  Laterality: N/A;  . pace maker defib  2016  . PERIPHERAL VASCULAR CATHETERIZATION N/A 10/23/2016   Procedure: Renal Angiography;  Surgeon: Algernon Huxley, MD;  Location: [place] [facility];  Service: Cardiovascular;  Laterality: N/A;  . TOTAL ABDOMINAL HYSTERECTOMY  1999   history of abnormal pap   Family History  Problem Relation Age of Onset  . Arthritis Mother   . Cancer Mother        uterus cancer  . Hyperlipidemia Mother   . Hypertension Mother   . Heart disease Mother   . Diabetes Mother   . Hyperlipidemia Father   . Hypertension Father   . Heart disease Father   . Diabetes Father   . Cancer Sister        ovary cancer  . Diabetes Maternal Grandmother   . Hypertension Maternal Grandmother   . Arthritis Maternal Grandmother   . Hypertension Maternal Grandfather   . Hypertension Paternal Grandmother   . Hypertension Paternal Grandfather   . Heart disease Paternal  Grandfather   . Breast cancer Neg Hx    Social History   Tobacco Use  . Smoking status: Current Every Day Smoker    Packs/day: 0.50    Types: Cigarettes  . Smokeless tobacco: Never Used  . Tobacco comment: 1/2-1 ppd  Substance Use Topics  . Alcohol use: Yes    Alcohol/week: 2.0 standard drinks    Types: 2 Shots of liquor per week   Allergies  Allergen Reactions  . Hydrocodone-Acetaminophen Nausea Only     Review of Systems  Constitutional: Positive for fatigue (easily). Negative for appetite change.  HENT: Negative for congestion, postnasal drip and sore throat.   Eyes: Negative.   Respiratory: Positive for shortness of breath and wheezing (at  times). Negative for chest tightness.   Cardiovascular: Negative for chest pain, palpitations and leg swelling.  Gastrointestinal: Negative for abdominal distention and abdominal pain.  Endocrine: Negative.   Genitourinary: Negative.   Musculoskeletal: Negative for back pain and neck pain.  Skin: Negative.   Allergic/Immunologic: Negative.   Neurological: Positive for light-headedness (when changing positions too quickly).  Hematological: Negative for adenopathy. Does not bruise/bleed easily.  Psychiatric/Behavioral: Negative for dysphoric mood and sleep disturbance (sleeping on 1 pillow). The patient is not nervous/anxious.      Physical Exam  Constitutional: She is oriented to person, place, and time. She appears well-developed and well-nourished.  HENT:  Head: Normocephalic and atraumatic.  Neck: Normal range of motion. Neck supple. No JVD present.  Cardiovascular: Normal rate and regular rhythm.  Pulmonary/Chest: Effort normal. No respiratory distress. She has no wheezes. She has no rales.  Abdominal: Soft. She exhibits no distension.  Musculoskeletal:       Right lower leg: She exhibits no tenderness and no edema.       Left lower leg: She exhibits no tenderness and no edema.  Neurological: She is alert and oriented to person, place, and time.  Skin: Skin is warm and dry. Ecchymosis (right antecubital) noted.  Psychiatric: She has a normal mood and affect. Her behavior is normal.  Nursing note and vitals reviewed.  Assessment & Plan:  1: Chronic heart failure with preserved ejection fraction- - NYHA class III - euvolemic today - weighing daily and she was instructed to call for an overnight weight gain of >2 pounds or a weekly weight gain of >5 pounds - weight  - not adding salt and has been reading food labels. Reviewed the importance of closely following a 2000mg  sodium diet and written dietary information was given to her about this - discussed getting updated  echocardiogram but she would like to talk with Dr. Saralyn Pilar at her next appointment in November - saw cardiology Margarito Courser) 03/20/17 - BNP 07/17/18 was 1730.0  2: HTN- - BP  - saw PCP Derrel Nip) - BMP done 07/18/18 reviewed and showed sodium 136, potassium 3.9, creatinine 1.28 and GFR 43  3: COPD- - has inhalers that she uses - has a nebulizer but hasn't used it yet  4: Tobacco use- - smoking 8 cigarettes daily - complete cessation discussed for 3 minutes with her.  Patient did not bring her medications nor a list. Each medication was verbally reviewed with the patient and she was encouraged to bring the bottles to every visit to confirm accuracy of list.

## 2018-07-30 NOTE — Assessment & Plan Note (Signed)
Etiology may have been drop in hgb.  Troponin was 0.03.  She was not transfused.  Currently compensated .  Taking furosemide 40 mg bid since discharge.

## 2018-07-30 NOTE — Assessment & Plan Note (Signed)
Risks of continued tobacco use were discussed. She is not contemplating quitting at the present time due to multiple home stressors  But has reduce use to < 1/2 pack daily

## 2018-07-30 NOTE — Assessment & Plan Note (Addendum)
Multifactorial with iron deficiency and as yet to be determined GI source of blood loss  B12 also < 300 confirmed with elevated MMA done during admission .  Has Barrett's esophagus,  Numerous polyps removed.d  .  GI work up continues,  Will order IV iron if no improvement since discharge. Checking intrinsic factor ab and will start parenteral .first dose given today   Lab Results  Component Value Date   WBC 8.4 07/29/2018   HGB 9.7 (L) 07/29/2018   HCT 28.9 (L) 07/29/2018   MCV 84.6 07/29/2018   PLT 354.0 07/29/2018   Lab Results  Component Value Date   IRON 17 (L) 07/18/2018   TIBC 428 07/18/2018   FERRITIN 17 07/01/2018   Lab Results  Component Value Date   VITAMINB12 249 07/01/2018

## 2018-07-30 NOTE — Assessment & Plan Note (Signed)
Patient is stable post discharge and has no new issues or questions about discharge plans at the visit today for hospital follow up. All labs , imaging studies and progress notes from admission were reviewed with patient today   

## 2018-07-31 ENCOUNTER — Ambulatory Visit: Payer: Medicare Other | Admitting: Family

## 2018-07-31 LAB — INTRINSIC FACTOR ANTIBODIES: INTRINSIC FACTOR: POSITIVE — AB

## 2018-07-31 LAB — IRON,TIBC AND FERRITIN PANEL
%SAT: 38 % (ref 16–45)
FERRITIN: 25 ng/mL (ref 16–288)
Iron: 175 ug/dL — ABNORMAL HIGH (ref 45–160)
TIBC: 455 mcg/dL (calc) — ABNORMAL HIGH (ref 250–450)

## 2018-08-01 NOTE — Telephone Encounter (Signed)
Spoke with pt and and informed her of the message below from [place]. Advised the pt that she would need to call her insurance company or call around to different pharmacies to see who will file medicare part B insurance and give [place] a call back to let us know. Pt gave a verbal understanding.

## 2018-08-05 ENCOUNTER — Telehealth: Payer: Self-pay

## 2018-08-05 MED ORDER — IPRATROPIUM-ALBUTEROL 0.5-2.5 (3) MG/3ML IN SOLN: 3.0000 mL | Freq: Four times a day (QID) | RESPIRATORY_TRACT | 1 refills | Status: DC | PRN

## 2018-08-05 NOTE — Progress Notes (Signed)
Patient ID: Ashley [name], female    DOB: 1951/08/04, 67 y.o.   MRN: [id number]  HPI  Ashley [name] is a 67 y/o female with a history of CAD (CABG), hyperlipidemia, HTN, CKD, GERD, depression, COPD, PVD, current tobacco use and chronic heart failure.   Echo report from 06/21/16 reviewed and showed an EF of 55-60% along with mild AR and moderate MR.   Admitted 07/17/18 due to acute HF exacerbation. Cardiology consult obtained. Was initially given IV lasix and then transitioned to oral diuretics. Discharged after 2 days. Was in the ED 06/26/18 due to HF exacerbation. IV lasix given and she diuresed ~ 1 liter. Released the same day. Was in the ED 05/28/18 due to alcohol intoxication where she was treated and released.   She presents today for a follow-up visit with a chief complaint of moderate fatigue upon minimal exertion. She says that this has been chronic in nature having been present for several years. She has associated shortness of breath, wheezing and light-headedness. She denies any difficulty sleeping, abdominal distention, palpitations, pedal edema, chest pain or weight gain.   Last echo was done 2017.  Past Medical History:  Diagnosis Date  . AICD (automatic cardioverter/defibrillator) present    on right side  . Anemia   . CAD (coronary artery disease)    s/p CABG  . CHF (congestive heart failure) ([place])   . Chronic kidney disease    renal artery stenosis  . Colon cancer ([place])   . COPD (chronic obstructive pulmonary disease) ([place])   . Depression   . Dyspnea   . GERD (gastroesophageal reflux disease)   . Hx of colonic polyps   . Hyperlipidemia   . Hypertension   . Mitral valve disorder    s/p mitral valve repair wth CABG  . Myocardial infarction ([place])   . Peripheral vascular disease ([place])   . Presence of permanent cardiac pacemaker    Pacemaker/ Defibrillator   Past Surgical History:  Procedure Laterality Date  . arm surgery     fracture, has plates and screws  . CABG  with mitral valve repair    . COLON SURGERY     colon cancer  . COLONOSCOPY WITH PROPOFOL N/A 10/09/2016   Procedure: COLONOSCOPY WITH PROPOFOL;  Surgeon: Jonathon Bellows, MD;  Location: [facility];  Service: Endoscopy;  Laterality: N/A;  . COLONOSCOPY WITH PROPOFOL N/A 07/24/2018   Procedure: COLONOSCOPY WITH PROPOFOL;  Surgeon: Virgel Manifold, MD;  Location: [facility];  Service: Endoscopy;  Laterality: N/A;  . CORONARY ARTERY BYPASS GRAFT    . ESOPHAGOGASTRODUODENOSCOPY (EGD) WITH PROPOFOL N/A 07/24/2018   Procedure: ESOPHAGOGASTRODUODENOSCOPY (EGD) WITH PROPOFOL;  Surgeon: Virgel Manifold, MD;  Location: [facility];  Service: Endoscopy;  Laterality: N/A;  . pace maker defib  2016  . PERIPHERAL VASCULAR CATHETERIZATION N/A 10/23/2016   Procedure: Renal Angiography;  Surgeon: Algernon Huxley, MD;  Location: [place] [facility];  Service: Cardiovascular;  Laterality: N/A;  . TOTAL ABDOMINAL HYSTERECTOMY  1999   history of abnormal pap   Family History  Problem Relation Age of Onset  . Arthritis Mother   . Cancer Mother        uterus cancer  . Hyperlipidemia Mother   . Hypertension Mother   . Heart disease Mother   . Diabetes Mother   . Hyperlipidemia Father   . Hypertension Father   . Heart disease Father   . Diabetes Father   . Cancer Sister  ovary cancer  . Diabetes Maternal Grandmother   . Hypertension Maternal Grandmother   . Arthritis Maternal Grandmother   . Hypertension Maternal Grandfather   . Hypertension Paternal Grandmother   . Hypertension Paternal Grandfather   . Heart disease Paternal Grandfather   . Breast cancer Neg Hx    Social History   Tobacco Use  . Smoking status: Current Every Day Smoker    Packs/day: 0.50    Types: Cigarettes  . Smokeless tobacco: Never Used  . Tobacco comment: 1/2-1 ppd  Substance Use Topics  . Alcohol use: Yes    Alcohol/week: 2.0 standard drinks    Types: 2 Shots of liquor per week   Allergies   Allergen Reactions  . Hydrocodone-Acetaminophen Nausea Only   Prior to Admission medications   Medication Sig Start Date End Date Taking? Authorizing Provider  acetaminophen (TYLENOL) 500 MG tablet Maximum 6 tablets a day. Patient taking differently: Take 500 mg by mouth every 4 (four) hours as needed for fever. Maximum 6 tablets a day. 02/09/15  Yes Doss, Velora Heckler, RN  ADVAIR DISKUS 250-50 MCG/DOSE AEPB INHALE 1 PUFF BY MOUTH TWICE A DAY. RINSE MOUTH AFTER EACH USE 04/09/18  Yes [name], MD  albuterol (PROVENTIL HFA;VENTOLIN HFA) 108 (90 Base) MCG/ACT inhaler Inhale 2 puffs into the lungs every 6 (six) hours as needed for wheezing. 05/08/18  Yes [name], MD  ALPRAZolam Duanne Moron) 0.5 MG tablet TAKE ONE TABLET BY MOUTH AT BEDTIME AS NEEDED FOR ANXIETY Patient taking differently: Take 0.25-0.5 mg by mouth at bedtime. TAKE ONE TABLET BY MOUTH AT BEDTIME AS NEEDED FOR ANXIETY 05/08/18  Yes [name], MD  aspirin 81 MG tablet Take 81 mg by mouth daily.   Yes [provider]  atorvastatin (LIPITOR) 40 MG tablet Take 1 tablet (40 mg total) by mouth daily. 05/08/18  Yes [name], MD  carvedilol (COREG) 6.25 MG tablet TAKE ONE TABLET BY MOUTH TWICE DAILY WITH MEAL Patient taking differently: Take 6.25 mg by mouth 2 (two) times daily with a meal.  04/09/18  Yes [name], MD  clopidogrel (PLAVIX) 75 MG tablet  07/24/18  Yes [provider]  cyanocobalamin (,VITAMIN B-12,) 1000 MCG/ML injection Inject 1 mL weekly into the muscle for 3 weeks,  Then monthly thereafter 07/29/18  Yes [name], MD  diphenhydrAMINE (DIPHENHIST) 25 mg capsule Take 25 mg by mouth at bedtime.   Yes [provider]  escitalopram (LEXAPRO) 10 MG tablet TAKE ONE TABLET BY MOUTH EVERY DAY 04/09/18  Yes [name], MD  ferrous sulfate 325 (65 FE) MG tablet Take 1 tablet (325 mg total) by mouth 2 (two) times daily with a meal. 07/20/18  Yes Gouru, Aruna, MD  furosemide (LASIX) 40 MG  tablet Take 1 tablet (40 mg total) by mouth 2 (two) times daily. 07/19/18 07/19/19 Yes Gouru, Aruna, MD  lisinopril (PRINIVIL,ZESTRIL) 20 MG tablet TAKE ONE TABLET BY MOUTH EVERY DAY 04/09/18  Yes [name], MD  nitroGLYCERIN (NITROSTAT) 0.4 MG SL tablet Place 1 tablet (0.4 mg total) under the tongue every 5 (five) minutes as needed for chest pain. 06/19/16  Yes [name], MD  omeprazole (PRILOSEC) 40 MG capsule TAKE ONE CAPSULE BY MOUTH EVERY DAY 04/09/18  Yes [name], MD  potassium chloride (K-DUR) 10 MEQ tablet Take 1 tablet (10 mEq total) by mouth daily. 07/19/18  Yes Gouru, Aruna, MD  Syringe/Needle, Disp, (SYRINGE 3CC/25GX1") 25G X 1" 3  ML MISC Use for b12 injections 07/29/18  Yes [name], MD  ipratropium-albuterol (DUONEB) 0.5-2.5 (3) MG/3ML SOLN Take 3 mLs by nebulization every 6 (six) hours as needed. Patient not taking: Reported on 08/06/2018 08/05/18   [name], MD  SPIRIVA RESPIMAT 2.5 MCG/ACT AERS ACTIVATE AND INHALE TWO SPRAYS ORALLY INTO THE LUNGS ONCE DAILY. Patient not taking: No sig reported 01/09/18   [name], MD    Review of Systems  Constitutional: Positive for fatigue (easily). Negative for appetite change.  HENT: Negative for congestion, postnasal drip and sore throat.   Eyes: Negative.   Respiratory: Positive for shortness of breath and wheezing (at times). Negative for chest tightness.   Cardiovascular: Negative for chest pain, palpitations and leg swelling.  Gastrointestinal: Negative for abdominal distention and abdominal pain.  Endocrine: Negative.   Genitourinary: Negative.   Musculoskeletal: Negative for back pain and neck pain.  Skin: Negative.   Allergic/Immunologic: Negative.   Neurological: Positive for light-headedness (when changing positions too quickly). Negative for dizziness.  Hematological: Negative for adenopathy. Does not bruise/bleed easily.  Psychiatric/Behavioral: Negative for dysphoric mood and sleep disturbance  (sleeping on 1 pillow). The patient is not nervous/anxious.    Vitals:   08/06/18 1205  BP: 107/65  Pulse: 79  Resp: 18  SpO2: 99%  Weight: 166 lb 4 oz (75.4 kg)  Height: 5\' 4"  (1.626 m)   Wt Readings from Last 3 Encounters:  08/06/18 166 lb 4 oz (75.4 kg)  07/29/18 166 lb 6.4 oz (75.5 kg)  07/24/18 168 lb (76.2 kg)   Lab Results  Component Value Date   CREATININE 1.38 (H) 07/29/2018   CREATININE 1.28 (H) 07/18/2018   CREATININE 1.23 (H) 07/17/2018    Physical Exam  Constitutional: She is oriented to person, place, and time. She appears well-developed and well-nourished.  HENT:  Head: Normocephalic and atraumatic.  Neck: Normal range of motion. Neck supple. No JVD present.  Cardiovascular: Normal rate and regular rhythm.  Pulmonary/Chest: Effort normal. No respiratory distress. She has no wheezes. She has no rales.  Abdominal: Soft. She exhibits no distension.  Musculoskeletal:       Right lower leg: She exhibits no tenderness and no edema.       Left lower leg: She exhibits no tenderness and no edema.  Neurological: She is alert and oriented to person, place, and time.  Skin: Skin is warm and dry.  Psychiatric: She has a normal mood and affect. Her behavior is normal.  Nursing note and vitals reviewed.  Assessment & Plan:  1: Chronic heart failure with preserved ejection fraction- - NYHA class III - euvolemic today - weighing daily and she was instructed to call for an overnight weight gain of >2 pounds or a weekly weight gain of >5 pounds - weight down 3 pounds since she was last here 6 weeks ago - not adding salt and has been reading food labels. Reviewed the importance of closely following a 2000mg  sodium diet  - discussed getting updated echocardiogram but she would like to talk with Dr. Saralyn Pilar at her next appointment in November - saw cardiology Margarito Courser) 03/20/17 - BNP 07/17/18 was 1730.0 - she has received her flu vaccine for this season  2: HTN- - BP on  the low side; will decrease furosemide to 40mg  daily; can take additional 40mg  if needed for above weight gain - saw PCP Derrel Nip) 07/29/18 - BMP done 07/29/18 reviewed and showed sodium 138, potassium 4.1, creatinine 1.38 and GFR  40.75  3: Tobacco use- - smoking 8 cigarettes daily - complete cessation discussed for 3 minutes with her.  Patient did not bring her medications nor a list. Each medication was verbally reviewed with the patient and she was encouraged to bring the bottles to every visit to confirm accuracy of list.  Return in 2 months or sooner for any questions/problems before then.

## 2018-08-05 NOTE — Addendum Note (Signed)
Addended by: Nanci Pina on: 08/05/2018 12:59 PM   Modules accepted: Orders

## 2018-08-05 NOTE — Telephone Encounter (Signed)
Returned call to [place] regarding PA no answer left message to call office.

## 2018-08-05 NOTE — Telephone Encounter (Signed)
Copied from [place] [id number]. Topic: General - Other >> Aug 05, 2018 10:30 AM Cecelia Byars, NT wrote: Reason for CRM: Ria Comment from [facility]  called and said she will wait until  2:00 pm today to deny prior authorization  for the part D of the claim for th patient  please call her at , [phone] ,please refer to previous messages ,

## 2018-08-05 NOTE — Telephone Encounter (Signed)
Ria Comment from [place] called to let us know that patient [place] is covered under part B medicare which [facility] cannot process they are not licensed to do so one of the larger chain pharmacies can so script sent to [facility].

## 2018-08-05 NOTE — Telephone Encounter (Signed)
Pt is aware that the medication has been sent in to [facility] in [facility].

## 2018-08-06 ENCOUNTER — Ambulatory Visit: Payer: Medicare Other | Attending: Family | Admitting: Family

## 2018-08-06 ENCOUNTER — Encounter: Payer: Self-pay | Admitting: Family

## 2018-08-06 VITALS — BP 107/65 | HR 79 | Resp 18 | Ht 64.0 in | Wt 166.2 lb

## 2018-08-06 DIAGNOSIS — Z95810 Presence of automatic (implantable) cardiac defibrillator: Secondary | ICD-10-CM | POA: Diagnosis not present

## 2018-08-06 DIAGNOSIS — I2510 Atherosclerotic heart disease of native coronary artery without angina pectoris: Secondary | ICD-10-CM | POA: Insufficient documentation

## 2018-08-06 DIAGNOSIS — I10 Essential (primary) hypertension: Secondary | ICD-10-CM

## 2018-08-06 DIAGNOSIS — Z7982 Long term (current) use of aspirin: Secondary | ICD-10-CM | POA: Diagnosis not present

## 2018-08-06 DIAGNOSIS — Z8249 Family history of ischemic heart disease and other diseases of the circulatory system: Secondary | ICD-10-CM | POA: Diagnosis not present

## 2018-08-06 DIAGNOSIS — Z720 Tobacco use: Secondary | ICD-10-CM

## 2018-08-06 DIAGNOSIS — F17210 Nicotine dependence, cigarettes, uncomplicated: Secondary | ICD-10-CM | POA: Insufficient documentation

## 2018-08-06 DIAGNOSIS — Z8261 Family history of arthritis: Secondary | ICD-10-CM | POA: Diagnosis not present

## 2018-08-06 DIAGNOSIS — Z90710 Acquired absence of both cervix and uterus: Secondary | ICD-10-CM | POA: Insufficient documentation

## 2018-08-06 DIAGNOSIS — Z951 Presence of aortocoronary bypass graft: Secondary | ICD-10-CM | POA: Diagnosis not present

## 2018-08-06 DIAGNOSIS — Z7902 Long term (current) use of antithrombotics/antiplatelets: Secondary | ICD-10-CM | POA: Diagnosis not present

## 2018-08-06 DIAGNOSIS — Z79899 Other long term (current) drug therapy: Secondary | ICD-10-CM | POA: Diagnosis not present

## 2018-08-06 DIAGNOSIS — I252 Old myocardial infarction: Secondary | ICD-10-CM | POA: Diagnosis not present

## 2018-08-06 DIAGNOSIS — Z85038 Personal history of other malignant neoplasm of large intestine: Secondary | ICD-10-CM | POA: Diagnosis not present

## 2018-08-06 DIAGNOSIS — Z7951 Long term (current) use of inhaled steroids: Secondary | ICD-10-CM | POA: Diagnosis not present

## 2018-08-06 DIAGNOSIS — Z8041 Family history of malignant neoplasm of ovary: Secondary | ICD-10-CM | POA: Diagnosis not present

## 2018-08-06 DIAGNOSIS — Z885 Allergy status to narcotic agent status: Secondary | ICD-10-CM | POA: Insufficient documentation

## 2018-08-06 DIAGNOSIS — N189 Chronic kidney disease, unspecified: Secondary | ICD-10-CM | POA: Insufficient documentation

## 2018-08-06 DIAGNOSIS — Z833 Family history of diabetes mellitus: Secondary | ICD-10-CM | POA: Diagnosis not present

## 2018-08-06 DIAGNOSIS — K219 Gastro-esophageal reflux disease without esophagitis: Secondary | ICD-10-CM | POA: Insufficient documentation

## 2018-08-06 DIAGNOSIS — I130 Hypertensive heart and chronic kidney disease with heart failure and stage 1 through stage 4 chronic kidney disease, or unspecified chronic kidney disease: Secondary | ICD-10-CM | POA: Diagnosis not present

## 2018-08-06 DIAGNOSIS — D649 Anemia, unspecified: Secondary | ICD-10-CM | POA: Insufficient documentation

## 2018-08-06 DIAGNOSIS — I5032 Chronic diastolic (congestive) heart failure: Secondary | ICD-10-CM

## 2018-08-06 DIAGNOSIS — E785 Hyperlipidemia, unspecified: Secondary | ICD-10-CM | POA: Diagnosis not present

## 2018-08-06 DIAGNOSIS — F329 Major depressive disorder, single episode, unspecified: Secondary | ICD-10-CM | POA: Diagnosis not present

## 2018-08-06 DIAGNOSIS — I509 Heart failure, unspecified: Secondary | ICD-10-CM | POA: Diagnosis present

## 2018-08-06 DIAGNOSIS — Z8049 Family history of malignant neoplasm of other genital organs: Secondary | ICD-10-CM | POA: Insufficient documentation

## 2018-08-06 DIAGNOSIS — I739 Peripheral vascular disease, unspecified: Secondary | ICD-10-CM | POA: Diagnosis not present

## 2018-08-06 DIAGNOSIS — J449 Chronic obstructive pulmonary disease, unspecified: Secondary | ICD-10-CM | POA: Diagnosis not present

## 2018-08-06 NOTE — Patient Instructions (Addendum)
Continue weighing daily and call for an overnight weight gain of > 2 pounds or a weekly weight gain of >5 pounds.  Decrease fluid pill to once daily but can take a second one if needed for above weight gain.

## 2018-08-09 ENCOUNTER — Other Ambulatory Visit: Payer: Self-pay

## 2018-08-09 ENCOUNTER — Telehealth: Payer: Self-pay

## 2018-08-09 DIAGNOSIS — J449 Chronic obstructive pulmonary disease, unspecified: Secondary | ICD-10-CM | POA: Diagnosis not present

## 2018-08-09 DIAGNOSIS — I10 Essential (primary) hypertension: Secondary | ICD-10-CM | POA: Diagnosis not present

## 2018-08-09 DIAGNOSIS — D500 Iron deficiency anemia secondary to blood loss (chronic): Secondary | ICD-10-CM

## 2018-08-09 DIAGNOSIS — I214 Non-ST elevation (NSTEMI) myocardial infarction: Secondary | ICD-10-CM | POA: Diagnosis not present

## 2018-08-09 DIAGNOSIS — I5031 Acute diastolic (congestive) heart failure: Secondary | ICD-10-CM | POA: Diagnosis not present

## 2018-08-09 DIAGNOSIS — I5022 Chronic systolic (congestive) heart failure: Secondary | ICD-10-CM | POA: Diagnosis not present

## 2018-08-09 DIAGNOSIS — I739 Peripheral vascular disease, unspecified: Secondary | ICD-10-CM | POA: Diagnosis not present

## 2018-08-09 DIAGNOSIS — Z951 Presence of aortocoronary bypass graft: Secondary | ICD-10-CM | POA: Diagnosis not present

## 2018-08-09 DIAGNOSIS — Z95810 Presence of automatic (implantable) cardiac defibrillator: Secondary | ICD-10-CM | POA: Diagnosis not present

## 2018-08-09 NOTE — Telephone Encounter (Signed)
-----   Message from Virgel Manifold, MD sent at 07/24/2018 11:37 AM EDT ----- Schedule for pill camera study

## 2018-08-09 NOTE — Telephone Encounter (Signed)
Capsule study ordered for 08/16/2018 at [facility]. Pt aware. Will send prep information via my chart.

## 2018-08-12 ENCOUNTER — Telehealth: Payer: Self-pay | Admitting: Gastroenterology

## 2018-08-12 NOTE — Telephone Encounter (Signed)
PLEASE  RE- EMAIL PT INSTRUCTIONS ON CAMERA STUDY

## 2018-08-12 NOTE — Telephone Encounter (Signed)
Done

## 2018-08-16 ENCOUNTER — Encounter
Admission: RE | Disposition: A | Discharge status: Home / Self Care | Payer: Self-pay | Source: Ambulatory Visit | Attending: Gastroenterology

## 2018-08-16 ENCOUNTER — Encounter: Payer: Self-pay | Admitting: Gastroenterology

## 2018-08-16 ENCOUNTER — Ambulatory Visit
Admission: RE | Admit: 2018-08-16 | Discharge: 2018-08-16 | Disposition: A | Discharge status: Home / Self Care | Payer: Medicare Other | Source: Ambulatory Visit | Attending: Gastroenterology | Admitting: Gastroenterology

## 2018-08-16 DIAGNOSIS — D509 Iron deficiency anemia, unspecified: Secondary | ICD-10-CM

## 2018-08-16 HISTORY — PX: GIVENS CAPSULE STUDY: SHX5432

## 2018-08-16 SURGERY — IMAGING PROCEDURE, GI TRACT, INTRALUMINAL, VIA CAPSULE
Anesthesia: General

## 2018-08-20 ENCOUNTER — Other Ambulatory Visit: Payer: Self-pay

## 2018-08-20 MED ORDER — POTASSIUM CHLORIDE ER 10 MEQ PO TBCR: 10.0000 meq | EXTENDED_RELEASE_TABLET | Freq: Every day | ORAL | 0 refills | Status: DC

## 2018-08-20 NOTE — Telephone Encounter (Signed)
Refilled: 07/19/2018 Last OV: 07/29/2018 Next OV: 11/08/2018 Last Potassium level: 4.1

## 2018-08-20 NOTE — Telephone Encounter (Signed)
Refilled: 07/19/2018 Last OV: 07/29/2018 Next OV: 11/08/2018 Last BMP: abnormal

## 2018-08-22 DIAGNOSIS — I5031 Acute diastolic (congestive) heart failure: Secondary | ICD-10-CM | POA: Diagnosis not present

## 2018-08-22 DIAGNOSIS — I5022 Chronic systolic (congestive) heart failure: Secondary | ICD-10-CM | POA: Diagnosis not present

## 2018-08-22 DIAGNOSIS — I214 Non-ST elevation (NSTEMI) myocardial infarction: Secondary | ICD-10-CM | POA: Diagnosis not present

## 2018-08-22 DIAGNOSIS — Z951 Presence of aortocoronary bypass graft: Secondary | ICD-10-CM | POA: Diagnosis not present

## 2018-08-23 ENCOUNTER — Telehealth: Payer: Self-pay

## 2018-08-23 NOTE — Telephone Encounter (Signed)
Pt notified of small bowel capsule results and having front desk contact her for an appt.

## 2018-08-23 NOTE — Telephone Encounter (Signed)
-----   Message from Virgel Manifold, MD sent at 08/23/2018 11:21 AM EDT ----- Jackelyn Poling,  Can you call this patient and let her know her small bowel capsule showed nonbleeding AVMs.  These are malformations for arteries and veins. We need to discuss this in clinic further. Follow up in 1-2 weeks.

## 2018-08-26 ENCOUNTER — Encounter: Payer: Self-pay | Admitting: Gastroenterology

## 2018-08-27 ENCOUNTER — Encounter: Payer: Self-pay | Admitting: Gastroenterology

## 2018-08-27 ENCOUNTER — Ambulatory Visit (INDEPENDENT_AMBULATORY_CARE_PROVIDER_SITE_OTHER): Payer: Medicare Other | Admitting: Gastroenterology

## 2018-08-27 VITALS — BP 123/73 | HR 80 | Wt 168.6 lb

## 2018-08-27 DIAGNOSIS — Q2733 Arteriovenous malformation of digestive system vessel: Secondary | ICD-10-CM

## 2018-08-27 DIAGNOSIS — D500 Iron deficiency anemia secondary to blood loss (chronic): Secondary | ICD-10-CM | POA: Diagnosis not present

## 2018-08-27 DIAGNOSIS — K5520 Angiodysplasia of colon without hemorrhage: Secondary | ICD-10-CM

## 2018-08-27 NOTE — Progress Notes (Signed)
Pt aware that we should have a date tomorrow after the hospital coordinates with Dr. Vicente Males and there schedule with the rep.

## 2018-08-27 NOTE — Progress Notes (Signed)
Ashley Antigua, MD [address]  [place]  [place], [place] [address]  Main: [phone]  Fax: [phone]   Primary Care Physician: [name], MD  Primary Gastroenterologist:  Dr. Vonda [name]  Chief Complaint  Patient presents with  . Follow-up    discuss small bowel caps results    HPI: Ashley [name] is a 67 y.o. female with history of positive FOBT, and anemia, with PillCam showing nonbleeding AVMs, here for follow-up.  Patient underwent EGD and colonoscopy in September 2019 for positive FOBT and iron deficiency anemia.  Colonoscopy showed 7 polyps, largest 5 mm in size, completely removed.  Diverticulosis reported.  Repeat colonoscopy recommended in 5 years.  EGD showed C0 M1 Barrett's.  Gastric erythema.  DIAGNOSIS:  A. DUODENUM; COLD BIOPSY:  - DUODENAL MUCOSA WITH INTACT VILLI.  - NEGATIVE FOR ACTIVE INFLAMMATION, INTRAEPITHELIAL LYMPHOCYTOSIS, AND  INFECTIOUS AGENTS.   B. STOMACH; COLD BIOPSY:  - UNREMARKABLE ANTRAL AND OXYNTIC MUCOSA.  - NEGATIVE FOR H. PYLORI, INTESTINAL METAPLASIA, ATROPHY, DYSPLASIA, AND  MALIGNANCY.   C. GASTROESOPHAGEAL JUNCTION; COLD BIOPSY:  - SQUAMOCOLUMNAR MUCOSA WITH CHRONIC ACTIVE INFLAMMATION SUGGESTIVE OF  REFLUX ESOPHAGITIS.  - INTESTINAL METAPLASIA (GOBLET CELLS) INVOLVING ONE OF FIVE FRAGMENTS.  - NEGATIVE FOR DYSPLASIA AND MALIGNANCY.  - CORRELATION WITH THE ENDOSCOPIC APPEARANCE IS REQUIRED; THE HISTOLOGY  WOULD SUPPORT THE DIAGNOSIS OF BARRETT'S ESOPHAGUS IN THE CORRECT  SETTING..   D. COLON POLYPS X 4, CECUM AND ASCENDING COLON; COLD BIOPSY AND COLD  SNARE:  - TUBULAR ADENOMAS, 3 FRAGMENTS, NEGATIVE FOR HIGH-GRADE DYSPLASIA AND  MALIGNANCY.  - FEATURES OF HYPERPLASTIC POLYP IN ONE TANGENTIALLY SECTIONED FRAGMENT,  NEGATIVE FOR DYSPLASIA AND MALIGNANCY.   E. RECTUM POLYPS X 2; COLD SNARE:  - HYPERPLASTIC POLYPS, MULTIPLE FRAGMENTS.  - NEGATIVE FOR DYSPLASIA AND MALIGNANCY.   Current  Outpatient Medications  Medication Sig Dispense Refill  . acetaminophen (TYLENOL) 500 MG tablet Maximum 6 tablets a day. (Patient taking differently: Take 500 mg by mouth every 4 (four) hours as needed for fever. Maximum 6 tablets a day.) 42 tablet 0  . ADVAIR DISKUS 250-50 MCG/DOSE AEPB INHALE 1 PUFF BY MOUTH TWICE A DAY. RINSE MOUTH AFTER EACH USE 180 each 1  . albuterol (PROVENTIL HFA;VENTOLIN HFA) 108 (90 Base) MCG/ACT inhaler Inhale 2 puffs into the lungs every 6 (six) hours as needed for wheezing. 18 g 2  . ALPRAZolam (XANAX) 0.5 MG tablet TAKE ONE TABLET BY MOUTH AT BEDTIME AS NEEDED FOR ANXIETY (Patient taking differently: Take 0.25-0.5 mg by mouth at bedtime. TAKE ONE TABLET BY MOUTH AT BEDTIME AS NEEDED FOR ANXIETY) 30 tablet 5  . aspirin 81 MG tablet Take 81 mg by mouth daily.    [name] atorvastatin (LIPITOR) 40 MG tablet Take 1 tablet (40 mg total) by mouth daily. 90 tablet 1  . carvedilol (COREG) 6.25 MG tablet TAKE ONE TABLET BY MOUTH TWICE DAILY WITH MEAL (Patient taking differently: Take 6.25 mg by mouth 2 (two) times daily with a meal. ) 180 tablet 1  . clopidogrel (PLAVIX) 75 MG tablet     . cyanocobalamin (,VITAMIN B-12,) 1000 MCG/ML injection Inject 1 mL weekly into the muscle for 3 weeks,  Then monthly thereafter 10 mL 0  . diphenhydrAMINE (DIPHENHIST) 25 mg capsule Take 25 mg by mouth at bedtime.    [name] escitalopram (LEXAPRO) 10 MG tablet TAKE ONE TABLET BY MOUTH EVERY DAY 90 tablet 1  . ferrous sulfate 325 (65 FE) MG tablet Take 1 tablet (  325 mg total) by mouth 2 (two) times daily with a meal.  3  . furosemide (LASIX) 40 MG tablet Take 1 tablet (40 mg total) by mouth 2 (two) times daily. (Patient taking differently: Take 40 mg by mouth daily. ) 60 tablet 0  . ipratropium-albuterol (DUONEB) 0.5-2.5 (3) MG/3ML SOLN Take 3 mLs by nebulization every 6 (six) hours as needed. 360 mL 1  . lisinopril (PRINIVIL,ZESTRIL) 20 MG tablet TAKE ONE TABLET BY MOUTH EVERY DAY 90 tablet 1  .  nitroGLYCERIN (NITROSTAT) 0.4 MG SL tablet Place 1 tablet (0.4 mg total) under the tongue every 5 (five) minutes as needed for chest pain. 30 tablet 3  . omeprazole (PRILOSEC) 40 MG capsule TAKE ONE CAPSULE BY MOUTH EVERY DAY 90 capsule 1  . potassium chloride (K-DUR) 10 MEQ tablet Take 1 tablet (10 mEq total) by mouth daily. 30 tablet 0  . SPIRIVA RESPIMAT 2.5 MCG/ACT AERS ACTIVATE AND INHALE TWO SPRAYS ORALLY INTO THE LUNGS ONCE DAILY. 4 g 0  . Syringe/Needle, Disp, (SYRINGE 3CC/25GX1") 25G X 1" 3 ML MISC Use for b12 injections 50 each 0   No current facility-administered medications for this visit.     Allergies as of 08/27/2018 - Review Complete 08/27/2018  Allergen Reaction Noted  . Hydrocodone-acetaminophen Nausea Only 08/25/2014    ROS:  General: Negative for anorexia, weight loss, fever, chills, fatigue, weakness. ENT: Negative for hoarseness, difficulty swallowing , nasal congestion. CV: Negative for chest pain, angina, palpitations, dyspnea on exertion, peripheral edema.  Respiratory: Negative for dyspnea at rest, dyspnea on exertion, cough, sputum, wheezing.  GI: See history of present illness. GU:  Negative for dysuria, hematuria, urinary incontinence, urinary frequency, nocturnal urination.  Endo: Negative for unusual weight change.    Physical Examination:   BP 123/73   Pulse 80   Wt 168 lb 9.6 oz (76.5 kg)   BMI 28.94 kg/m   General: Well-nourished, well-developed in no acute distress.  Eyes: No icterus. Conjunctivae pink. Mouth: Oropharyngeal mucosa moist and pink , no lesions erythema or exudate. Neck: Supple, Trachea midline Abdomen: Bowel sounds are normal, nontender, nondistended, no hepatosplenomegaly or masses, no abdominal bruits or hernia , no rebound or guarding.   Extremities: No lower extremity edema. No clubbing or deformities. Neuro: Alert and oriented x 3.  Grossly intact. Skin: Warm and dry, no jaundice.   Psych: Alert and cooperative, normal  mood and affect.   Labs: CMP     Component Value Date/Time   NA 138 07/29/2018 1157   NA 141 10/27/2014 1304   K 4.1 07/29/2018 1157   K 4.0 10/27/2014 1304   CL 99 07/29/2018 1157   CL 108 (H) 10/27/2014 1304   CO2 29 07/29/2018 1157   CO2 27 10/27/2014 1304   GLUCOSE 118 (H) 07/29/2018 1157   GLUCOSE 93 10/27/2014 1304   BUN 19 07/29/2018 1157   BUN 12 10/27/2014 1304   CREATININE 1.38 (H) 07/29/2018 1157   CREATININE 1.35 (H) 07/01/2018 1401   CALCIUM 9.8 07/29/2018 1157   CALCIUM 9.0 10/27/2014 1304   PROT 6.3 (L) 07/17/2018 0914   PROT 6.8 08/18/2014 2233   ALBUMIN 3.5 07/17/2018 0914   ALBUMIN 3.3 (L) 08/18/2014 2233   AST 90 (H) 07/17/2018 0914   AST 83 (H) 08/18/2014 2233   ALT 57 (H) 07/17/2018 0914   ALT 65 (H) 08/18/2014 2233   ALKPHOS 98 07/17/2018 0914   ALKPHOS 122 (H) 08/18/2014 2233   BILITOT 0.8 07/17/2018 0914  BILITOT 0.4 08/18/2014 2233   GFRNONAA 43 (L) 07/18/2018 0007   GFRNONAA >60 10/27/2014 1304   GFRNONAA 57 (L) 05/30/2013 0038   GFRAA 50 (L) 07/18/2018 0007   GFRAA >60 10/27/2014 1304   GFRAA >60 05/30/2013 0038   Lab Results  Component Value Date   WBC 8.4 07/29/2018   HGB 9.7 (L) 07/29/2018   HCT 28.9 (L) 07/29/2018   MCV 84.6 07/29/2018   PLT 354.0 07/29/2018    Imaging Studies: No results found.  Assessment and Plan:   DEANE MELICK is a 67 y.o. y/o female with history of positive FOBT and iron deficiency anemia, with nonbleeding AVMs seen on pill camera study, EGD showing Barrett's esophagus, colonoscopy with 7 polyps removed, all less than 5 mm, with some showing tubular adenoma  -Nonbleeding AVMs and iron deficiency anemia We discussed the finding of nonbleeding AVMs in detail We discussed the options of balloon enteroscopy with Dr. Vicente Males for treatment/cauterization of these AVMs, which would improve her iron deficiency anemia. We also discussed the alternative of conservative management with iron replacement.  Risks  and benefits of the procedure were discussed in detail. Patient would like to proceed with balloon enteroscopy and this will be scheduled Continue iron replacement as ordered with her primary care physician and iron level has improved  -Barrett's esophagus Patient denies any heartburn Continue acid reflux lifestyle modifications Patient is on Prilosec daily by primary care provider and this controls her heartburn well (Risks of PPI use were discussed with patient including bone loss, C. Diff diarrhea, pneumonia, infections, CKD, electrolyte abnormalities. Pt. Verbalizes understanding and chooses to continue the medication.)  -History of tubular adenoma polyps Repeat colonoscopy for surveillance recommended in 5 years from September 2019   Dr Ashley [name]

## 2018-08-28 ENCOUNTER — Telehealth: Payer: Self-pay

## 2018-08-28 NOTE — Telephone Encounter (Signed)
Pt notified of appt for Enteroscopy (balloon) for 09/11/2018. Will need approval to remain off Plavix 5 days prior to procedure.

## 2018-09-04 NOTE — Telephone Encounter (Signed)
Faxed blood thinner request to Dr. Lupita Dawn office. Original request 10/24.

## 2018-09-06 ENCOUNTER — Other Ambulatory Visit: Payer: Self-pay | Admitting: Internal Medicine

## 2018-09-06 NOTE — Telephone Encounter (Signed)
Pt has been advised we received blood thinner clearance from Dr. Derrel Nip. She has been advised to stop Plavix 7 days prior to procedure and resume medication 3 days after.

## 2018-09-09 NOTE — Telephone Encounter (Signed)
Patient contacted office regarding cancelling her Enteroscopy scheduled for 11/06.  She is sick, with a bad cold and congestion.  I've asked her to call the office when she is feeling better to reschedule her enteroscopy.  No cancellation fee applies for cancellation since she is sick and canceled 48 hours in advance.  Thanks [employer]

## 2018-09-10 NOTE — Telephone Encounter (Signed)
Reminder set up to contact pt on 09/16/2018 and reschedule Balloon Enteroscopy.

## 2018-09-11 ENCOUNTER — Ambulatory Visit: Admission: RE | Admit: 2018-09-11 | Payer: Medicare Other | Source: Ambulatory Visit | Admitting: Gastroenterology

## 2018-09-11 ENCOUNTER — Encounter: Admission: RE | Payer: Self-pay | Source: Ambulatory Visit

## 2018-09-11 SURGERY — ENTEROSCOPY
Anesthesia: General

## 2018-09-13 ENCOUNTER — Ambulatory Visit (INDEPENDENT_AMBULATORY_CARE_PROVIDER_SITE_OTHER): Payer: Medicare Other | Admitting: Vascular Surgery

## 2018-09-13 ENCOUNTER — Encounter (INDEPENDENT_AMBULATORY_CARE_PROVIDER_SITE_OTHER): Payer: Medicare Other

## 2018-09-14 ENCOUNTER — Other Ambulatory Visit: Payer: Self-pay | Admitting: Internal Medicine

## 2018-09-18 ENCOUNTER — Telehealth: Payer: Self-pay

## 2018-09-18 NOTE — Telephone Encounter (Signed)
-----   Message from Martie Lee, LPN sent at [id number]/0/5259  8:29 AM EST ----- Regarding: reschedule balloon enteroscopy pt was sick with cold, had to cancel 11/6,

## 2018-09-18 NOTE — Telephone Encounter (Signed)
Pt states she is still sick and does sound congested and coughing. States she is taking tylenol. She states she is afraid of meds due to high blood pressure. Encourage pt to see her PCP or urgent care because this has lasted now for nearly a month. Will place another reminder to contact pt again in 2 weeks.

## 2018-09-23 ENCOUNTER — Other Ambulatory Visit: Payer: Self-pay | Admitting: Internal Medicine

## 2018-09-23 ENCOUNTER — Telehealth: Payer: Self-pay | Admitting: Internal Medicine

## 2018-09-23 NOTE — Telephone Encounter (Signed)
Copied from [place] [id number]. Topic: General - Other >> Sep 23, 2018 10:10 AM Janace Aris A wrote: Medication: furosemide (LASIX) 40 MG tablet  Has the patient contacted their pharmacy? yes  Preferred Pharmacy (with phone number or street name): [facility], [place], [place]  [phone] (Phone) [phone] (Fax)    Agent: Please be advised that RX refills may take up to 3 business days. We ask that you follow-up with your pharmacy.

## 2018-09-24 NOTE — Telephone Encounter (Signed)
Already refilled in another encounter 09/23/18.

## 2018-10-07 NOTE — Progress Notes (Deleted)
Patient ID: Ashley [name], female    DOB: 01-Apr-1951, 67 y.o.   MRN: [id number]  HPI  Ashley [name] is a 67 y/o female with a history of CAD (CABG), hyperlipidemia, HTN, CKD, GERD, depression, COPD, PVD, current tobacco use and chronic heart failure.   Echo report from 08/22/18 reviewed and showed an EF of 30% along with moderate MR. Echo report from 06/21/16 reviewed and showed an EF of 55-60% along with mild AR and moderate MR.   Admitted 07/17/18 due to acute HF exacerbation. Cardiology consult obtained. Was initially given IV lasix and then transitioned to oral diuretics. Discharged after 2 days. Was in the ED 06/26/18 due to HF exacerbation. IV lasix given and she diuresed ~ 1 liter. Released the same day. Was in the ED 05/28/18 due to alcohol intoxication where she was treated and released.   She presents today for a follow-up visit with a chief complaint of   Past Medical History:  Diagnosis Date  . AICD (automatic cardioverter/defibrillator) present    on right side  . Anemia   . CAD (coronary artery disease)    s/p CABG  . CHF (congestive heart failure) ([place])   . Chronic kidney disease    renal artery stenosis  . Colon cancer ([place])   . COPD (chronic obstructive pulmonary disease) ([place])   . Depression   . Dyspnea   . GERD (gastroesophageal reflux disease)   . Hx of colonic polyps   . Hyperlipidemia   . Hypertension   . Mitral valve disorder    s/p mitral valve repair wth CABG  . Myocardial infarction ([place])   . Peripheral vascular disease ([place])   . Presence of permanent cardiac pacemaker    Pacemaker/ Defibrillator   Past Surgical History:  Procedure Laterality Date  . arm surgery     fracture, has plates and screws  . CABG with mitral valve repair    . COLON SURGERY     colon cancer  . COLONOSCOPY WITH PROPOFOL N/A 10/09/2016   Procedure: COLONOSCOPY WITH PROPOFOL;  Surgeon: Jonathon Bellows, MD;  Location: [facility];  Service: Endoscopy;  Laterality: N/A;  .  COLONOSCOPY WITH PROPOFOL N/A 07/24/2018   Procedure: COLONOSCOPY WITH PROPOFOL;  Surgeon: Virgel Manifold, MD;  Location: [facility];  Service: Endoscopy;  Laterality: N/A;  . CORONARY ARTERY BYPASS GRAFT    . ESOPHAGOGASTRODUODENOSCOPY (EGD) WITH PROPOFOL N/A 07/24/2018   Procedure: ESOPHAGOGASTRODUODENOSCOPY (EGD) WITH PROPOFOL;  Surgeon: Virgel Manifold, MD;  Location: [facility];  Service: Endoscopy;  Laterality: N/A;  . GIVENS CAPSULE STUDY N/A 08/16/2018   Procedure: GIVENS CAPSULE STUDY;  Surgeon: Virgel Manifold, MD;  Location: [facility];  Service: Endoscopy;  Laterality: N/A;  . pace maker defib  2016  . PERIPHERAL VASCULAR CATHETERIZATION N/A 10/23/2016   Procedure: Renal Angiography;  Surgeon: Algernon Huxley, MD;  Location: [facility];  Service: Cardiovascular;  Laterality: N/A;  . TOTAL ABDOMINAL HYSTERECTOMY  1999   history of abnormal pap   Family History  Problem Relation Age of Onset  . Arthritis Mother   . Cancer Mother        uterus cancer  . Hyperlipidemia Mother   . Hypertension Mother   . Heart disease Mother   . Diabetes Mother   . Hyperlipidemia Father   . Hypertension Father   . Heart disease Father   . Diabetes Father   . Cancer Sister        ovary cancer  .  Diabetes Maternal Grandmother   . Hypertension Maternal Grandmother   . Arthritis Maternal Grandmother   . Hypertension Maternal Grandfather   . Hypertension Paternal Grandmother   . Hypertension Paternal Grandfather   . Heart disease Paternal Grandfather   . Breast cancer Neg Hx    Social History   Tobacco Use  . Smoking status: Current Every Day Smoker    Packs/day: 0.50    Types: Cigarettes  . Smokeless tobacco: Never Used  . Tobacco comment: 1/2-1 ppd  Substance Use Topics  . Alcohol use: Yes    Alcohol/week: 2.0 standard drinks    Types: 2 Shots of liquor per week   Allergies  Allergen Reactions  . Hydrocodone-Acetaminophen Nausea Only      Review of Systems  Constitutional: Positive for fatigue (easily). Negative for appetite change.  HENT: Negative for congestion, postnasal drip and sore throat.   Eyes: Negative.   Respiratory: Positive for shortness of breath and wheezing (at times). Negative for chest tightness.   Cardiovascular: Negative for chest pain, palpitations and leg swelling.  Gastrointestinal: Negative for abdominal distention and abdominal pain.  Endocrine: Negative.   Genitourinary: Negative.   Musculoskeletal: Negative for back pain and neck pain.  Skin: Negative.   Allergic/Immunologic: Negative.   Neurological: Positive for light-headedness (when changing positions too quickly). Negative for dizziness.  Hematological: Negative for adenopathy. Does not bruise/bleed easily.  Psychiatric/Behavioral: Negative for dysphoric mood and sleep disturbance (sleeping on 1 pillow). The patient is not nervous/anxious.      Physical Exam  Constitutional: She is oriented to person, place, and time. She appears well-developed and well-nourished.  HENT:  Head: Normocephalic and atraumatic.  Neck: Normal range of motion. Neck supple. No JVD present.  Cardiovascular: Normal rate and regular rhythm.  Pulmonary/Chest: Effort normal. No respiratory distress. She has no wheezes. She has no rales.  Abdominal: Soft. She exhibits no distension.  Musculoskeletal:       Right lower leg: She exhibits no tenderness and no edema.       Left lower leg: She exhibits no tenderness and no edema.  Neurological: She is alert and oriented to person, place, and time.  Skin: Skin is warm and dry.  Psychiatric: She has a normal mood and affect. Her behavior is normal.  Nursing note and vitals reviewed.  Assessment & Plan:  1: Chronic heart failure with reduced ejection fraction- - NYHA class III - euvolemic today - recent echo shows reduced EF from 2 years prior - weighing daily and she was instructed to call for an overnight  weight gain of >2 pounds or a weekly weight gain of >5 pounds - weight  - not adding salt and has been reading food labels. Reviewed the importance of closely following a 2000mg  sodium diet  -  - saw cardiology Margarito Courser) 03/20/17 - BNP 07/17/18 was 1730.0 - she has received her flu vaccine for this season  2: HTN- - BP  - saw PCP Derrel Nip) 07/29/18 - BMP done 07/29/18 reviewed and showed sodium 138, potassium 4.1, creatinine 1.38 and GFR 40.75  3: Tobacco use- - smoking 8 cigarettes daily - complete cessation discussed for 3 minutes with her.  Patient did not bring her medications nor a list. Each medication was verbally reviewed with the patient and she was encouraged to bring the bottles to every visit to confirm accuracy of list.

## 2018-10-08 ENCOUNTER — Ambulatory Visit: Payer: Medicare Other | Admitting: Family

## 2018-10-20 NOTE — Progress Notes (Deleted)
Patient ID: Ashley [name], female    DOB: [birth year], 67 y.o.   MRN: [id number]  HPI  Ashley [name] is a 67 y/o female with a history of CAD (CABG), hyperlipidemia, HTN, CKD, GERD, depression, COPD, PVD, current tobacco use and chronic heart failure.   Echo report from 08/22/18 reviewed and showed an EF of 30% along with moderate MR. Echo report from 06/21/16 reviewed and showed an EF of 55-60% along with mild AR and moderate MR.   Admitted 07/17/18 due to acute HF exacerbation. Cardiology consult obtained. Was initially given IV lasix and then transitioned to oral diuretics. Discharged after 2 days. Was in the ED 06/26/18 due to HF exacerbation. IV lasix given and she diuresed ~ 1 liter. Released the same day. Was in the ED 05/28/18 due to alcohol intoxication where she was treated and released.   She presents today for a follow-up visit with a chief complaint of   Past Medical History:  Diagnosis Date  . AICD (automatic cardioverter/defibrillator) present    on right side  . Anemia   . CAD (coronary artery disease)    s/p CABG  . CHF (congestive heart failure) ([place])   . Chronic kidney disease    renal artery stenosis  . Colon cancer ([place])   . COPD (chronic obstructive pulmonary disease) ([place])   . Depression   . Dyspnea   . GERD (gastroesophageal reflux disease)   . Hx of colonic polyps   . Hyperlipidemia   . Hypertension   . Mitral valve disorder    s/p mitral valve repair wth CABG  . Myocardial infarction ([place])   . Peripheral vascular disease ([place])   . Presence of permanent cardiac pacemaker    Pacemaker/ Defibrillator   Past Surgical History:  Procedure Laterality Date  . arm surgery     fracture, has plates and screws  . CABG with mitral valve repair    . COLON SURGERY     colon cancer  . COLONOSCOPY WITH PROPOFOL N/A 10/09/2016   Procedure: COLONOSCOPY WITH PROPOFOL;  Surgeon: Jonathon Bellows, MD;  Location: [facility];  Service: Endoscopy;  Laterality: N/A;  .  COLONOSCOPY WITH PROPOFOL N/A 07/24/2018   Procedure: COLONOSCOPY WITH PROPOFOL;  Surgeon: Virgel Manifold, MD;  Location: [facility];  Service: Endoscopy;  Laterality: N/A;  . CORONARY ARTERY BYPASS GRAFT    . ESOPHAGOGASTRODUODENOSCOPY (EGD) WITH PROPOFOL N/A 07/24/2018   Procedure: ESOPHAGOGASTRODUODENOSCOPY (EGD) WITH PROPOFOL;  Surgeon: Virgel Manifold, MD;  Location: [facility];  Service: Endoscopy;  Laterality: N/A;  . GIVENS CAPSULE STUDY N/A 08/16/2018   Procedure: GIVENS CAPSULE STUDY;  Surgeon: Virgel Manifold, MD;  Location: [facility];  Service: Endoscopy;  Laterality: N/A;  . pace maker defib  2016  . PERIPHERAL VASCULAR CATHETERIZATION N/A 10/23/2016   Procedure: Renal Angiography;  Surgeon: Algernon Huxley, MD;  Location: [facility];  Service: Cardiovascular;  Laterality: N/A;  . TOTAL ABDOMINAL HYSTERECTOMY  1999   history of abnormal pap   Family History  Problem Relation Age of Onset  . Arthritis Mother   . Cancer Mother        uterus cancer  . Hyperlipidemia Mother   . Hypertension Mother   . Heart disease Mother   . Diabetes Mother   . Hyperlipidemia Father   . Hypertension Father   . Heart disease Father   . Diabetes Father   . Cancer Sister        ovary cancer  .  Diabetes Maternal Grandmother   . Hypertension Maternal Grandmother   . Arthritis Maternal Grandmother   . Hypertension Maternal Grandfather   . Hypertension Paternal Grandmother   . Hypertension Paternal Grandfather   . Heart disease Paternal Grandfather   . Breast cancer Neg Hx    Social History   Tobacco Use  . Smoking status: Current Every Day Smoker    Packs/day: 0.50    Types: Cigarettes  . Smokeless tobacco: Never Used  . Tobacco comment: 1/2-1 ppd  Substance Use Topics  . Alcohol use: Yes    Alcohol/week: 2.0 standard drinks    Types: 2 Shots of liquor per week   Allergies  Allergen Reactions  . Hydrocodone-Acetaminophen Nausea Only      Review of Systems  Constitutional: Positive for fatigue (easily). Negative for appetite change.  HENT: Negative for congestion, postnasal drip and sore throat.   Eyes: Negative.   Respiratory: Positive for shortness of breath and wheezing (at times). Negative for chest tightness.   Cardiovascular: Negative for chest pain, palpitations and leg swelling.  Gastrointestinal: Negative for abdominal distention and abdominal pain.  Endocrine: Negative.   Genitourinary: Negative.   Musculoskeletal: Negative for back pain and neck pain.  Skin: Negative.   Allergic/Immunologic: Negative.   Neurological: Positive for light-headedness (when changing positions too quickly). Negative for dizziness.  Hematological: Negative for adenopathy. Does not bruise/bleed easily.  Psychiatric/Behavioral: Negative for dysphoric mood and sleep disturbance (sleeping on 1 pillow). The patient is not nervous/anxious.      Physical Exam Vitals signs and nursing note reviewed.  Constitutional:      Appearance: She is well-developed.  HENT:     Head: Normocephalic and atraumatic.  Neck:     Musculoskeletal: Normal range of motion and neck supple.     Vascular: No JVD.  Cardiovascular:     Rate and Rhythm: Normal rate and regular rhythm.  Pulmonary:     Effort: Pulmonary effort is normal. No respiratory distress.     Breath sounds: No wheezing or rales.  Abdominal:     General: There is no distension.     Palpations: Abdomen is soft.  Musculoskeletal:     Right lower leg: She exhibits no tenderness. No edema.     Left lower leg: She exhibits no tenderness. No edema.  Skin:    General: Skin is warm and dry.  Neurological:     Mental Status: She is alert and oriented to person, place, and time.  Psychiatric:        Behavior: Behavior normal.    Assessment & Plan:  1: Chronic heart failure with reduced ejection fraction- - NYHA class III - euvolemic today - recent echo shows reduced EF from 2  years prior - weighing daily and she was instructed to call for an overnight weight gain of >2 pounds or a weekly weight gain of >5 pounds - weight  - not adding salt and has been reading food labels. Reviewed the importance of closely following a 2000mg  sodium diet  -  - saw cardiology Margarito Courser) 03/20/17 - BNP 07/17/18 was 1730.0 - she has received her flu vaccine for this season  2: HTN- - BP  - saw PCP Derrel Nip) 07/29/18 - BMP done 07/29/18 reviewed and showed sodium 138, potassium 4.1, creatinine 1.38 and GFR 40.75  3: Tobacco use- - smoking 8 cigarettes daily - complete cessation discussed for 3 minutes with her.  Patient did not bring her medications nor a list. Each medication was verbally  reviewed with the patient and she was encouraged to bring the bottles to every visit to confirm accuracy of list.

## 2018-10-21 ENCOUNTER — Ambulatory Visit: Payer: Medicare Other | Admitting: Family

## 2018-10-21 ENCOUNTER — Telehealth: Payer: Self-pay | Admitting: Family

## 2018-10-21 NOTE — Telephone Encounter (Signed)
Patient did not show for her [facility] appointment on 10/21/18. Will attempt to reschedule.

## 2018-11-04 ENCOUNTER — Other Ambulatory Visit: Payer: Self-pay

## 2018-11-04 ENCOUNTER — Ambulatory Visit: Payer: Medicare Other | Attending: Family | Admitting: Family

## 2018-11-04 ENCOUNTER — Telehealth: Payer: Self-pay

## 2018-11-04 ENCOUNTER — Other Ambulatory Visit: Payer: Self-pay | Admitting: Internal Medicine

## 2018-11-04 ENCOUNTER — Encounter: Payer: Self-pay | Admitting: Family

## 2018-11-04 VITALS — BP 117/84 | HR 78 | Resp 18 | Ht 64.0 in | Wt 169.0 lb

## 2018-11-04 DIAGNOSIS — Z951 Presence of aortocoronary bypass graft: Secondary | ICD-10-CM | POA: Diagnosis not present

## 2018-11-04 DIAGNOSIS — Z8041 Family history of malignant neoplasm of ovary: Secondary | ICD-10-CM | POA: Diagnosis not present

## 2018-11-04 DIAGNOSIS — Z8249 Family history of ischemic heart disease and other diseases of the circulatory system: Secondary | ICD-10-CM | POA: Insufficient documentation

## 2018-11-04 DIAGNOSIS — Z885 Allergy status to narcotic agent status: Secondary | ICD-10-CM | POA: Diagnosis not present

## 2018-11-04 DIAGNOSIS — I739 Peripheral vascular disease, unspecified: Secondary | ICD-10-CM | POA: Diagnosis not present

## 2018-11-04 DIAGNOSIS — I2510 Atherosclerotic heart disease of native coronary artery without angina pectoris: Secondary | ICD-10-CM | POA: Diagnosis not present

## 2018-11-04 DIAGNOSIS — I252 Old myocardial infarction: Secondary | ICD-10-CM | POA: Insufficient documentation

## 2018-11-04 DIAGNOSIS — N189 Chronic kidney disease, unspecified: Secondary | ICD-10-CM | POA: Diagnosis not present

## 2018-11-04 DIAGNOSIS — E7800 Pure hypercholesterolemia, unspecified: Secondary | ICD-10-CM

## 2018-11-04 DIAGNOSIS — I5022 Chronic systolic (congestive) heart failure: Secondary | ICD-10-CM | POA: Diagnosis not present

## 2018-11-04 DIAGNOSIS — E785 Hyperlipidemia, unspecified: Secondary | ICD-10-CM | POA: Diagnosis not present

## 2018-11-04 DIAGNOSIS — Z950 Presence of cardiac pacemaker: Secondary | ICD-10-CM | POA: Diagnosis not present

## 2018-11-04 DIAGNOSIS — F329 Major depressive disorder, single episode, unspecified: Secondary | ICD-10-CM | POA: Insufficient documentation

## 2018-11-04 DIAGNOSIS — J449 Chronic obstructive pulmonary disease, unspecified: Secondary | ICD-10-CM | POA: Insufficient documentation

## 2018-11-04 DIAGNOSIS — I130 Hypertensive heart and chronic kidney disease with heart failure and stage 1 through stage 4 chronic kidney disease, or unspecified chronic kidney disease: Secondary | ICD-10-CM | POA: Diagnosis not present

## 2018-11-04 DIAGNOSIS — F17210 Nicotine dependence, cigarettes, uncomplicated: Secondary | ICD-10-CM | POA: Diagnosis not present

## 2018-11-04 DIAGNOSIS — Z79899 Other long term (current) drug therapy: Secondary | ICD-10-CM | POA: Insufficient documentation

## 2018-11-04 DIAGNOSIS — Z808 Family history of malignant neoplasm of other organs or systems: Secondary | ICD-10-CM | POA: Diagnosis not present

## 2018-11-04 DIAGNOSIS — K219 Gastro-esophageal reflux disease without esophagitis: Secondary | ICD-10-CM | POA: Diagnosis not present

## 2018-11-04 DIAGNOSIS — I10 Essential (primary) hypertension: Secondary | ICD-10-CM

## 2018-11-04 DIAGNOSIS — Z85038 Personal history of other malignant neoplasm of large intestine: Secondary | ICD-10-CM | POA: Diagnosis not present

## 2018-11-04 DIAGNOSIS — Z7982 Long term (current) use of aspirin: Secondary | ICD-10-CM | POA: Diagnosis not present

## 2018-11-04 DIAGNOSIS — Z720 Tobacco use: Secondary | ICD-10-CM

## 2018-11-04 MED ORDER — SACUBITRIL-VALSARTAN 24-26 MG PO TABS: 1.0000 | ORAL_TABLET | Freq: Two times a day (BID) | ORAL | 3 refills | Status: DC

## 2018-11-04 NOTE — Patient Instructions (Addendum)
Continue weighing daily and call for an overnight weight gain of > 2 pounds or a weekly weight gain of >5 pounds.  Finish lisinopril, skip one day and then begin entresto 1 tablet twice daily.

## 2018-11-04 NOTE — Progress Notes (Signed)
Patient ID: Ashley [name], female    DOB: 05/07/1951, 67 y.o.   MRN: [id number]  HPI  Ashley [name] is a 67 y/o female with a history of CAD (CABG), hyperlipidemia, HTN, CKD, GERD, depression, COPD, PVD, current tobacco use and chronic heart failure.   Echo report from 08/22/18 reviewed and showed an EF of 30% along with moderate MR. Echo report from 06/21/16 reviewed and showed an EF of 55-60% along with mild AR and moderate MR.   Admitted 07/17/18 due to acute HF exacerbation. Cardiology consult obtained. Was initially given IV lasix and then transitioned to oral diuretics. Discharged after 2 days. Was in the ED 06/26/18 due to HF exacerbation. IV lasix given and she diuresed ~ 1 liter. Released the same day. Was in the ED 05/28/18 due to alcohol intoxication where she was treated and released.   She presents today for a follow-up visit with a chief complaint of moderate fatigue upon minimal exertion. She describes this as chronic in nature having been present for several years. She has associated cough, shortness of breath, wheezing, light-headedness and slight weight gain. She denies any difficulty sleeping, abdominal distention, palpitations, pedal edema or chest pain.   Past Medical History:  Diagnosis Date  . AICD (automatic cardioverter/defibrillator) present    on right side  . Anemia   . CAD (coronary artery disease)    s/p CABG  . CHF (congestive heart failure) ([place])   . Chronic kidney disease    renal artery stenosis  . Colon cancer ([place])   . COPD (chronic obstructive pulmonary disease) ([place])   . Depression   . Dyspnea   . GERD (gastroesophageal reflux disease)   . Hx of colonic polyps   . Hyperlipidemia   . Hypertension   . Mitral valve disorder    s/p mitral valve repair wth CABG  . Myocardial infarction (Ashley [name])   . Peripheral vascular disease ([place])   . Presence of permanent cardiac pacemaker    Pacemaker/ Defibrillator   Past Surgical History:  Procedure Laterality  Date  . arm surgery     fracture, has plates and screws  . CABG with mitral valve repair    . COLON SURGERY     colon cancer  . COLONOSCOPY WITH PROPOFOL N/A 10/09/2016   Procedure: COLONOSCOPY WITH PROPOFOL;  Surgeon: Jonathon Bellows, MD;  Location: [facility];  Service: Endoscopy;  Laterality: N/A;  . COLONOSCOPY WITH PROPOFOL N/A 07/24/2018   Procedure: COLONOSCOPY WITH PROPOFOL;  Surgeon: Virgel Manifold, MD;  Location: [facility];  Service: Endoscopy;  Laterality: N/A;  . CORONARY ARTERY BYPASS GRAFT    . ESOPHAGOGASTRODUODENOSCOPY (EGD) WITH PROPOFOL N/A 07/24/2018   Procedure: ESOPHAGOGASTRODUODENOSCOPY (EGD) WITH PROPOFOL;  Surgeon: Virgel Manifold, MD;  Location: [facility];  Service: Endoscopy;  Laterality: N/A;  . GIVENS CAPSULE STUDY N/A 08/16/2018   Procedure: GIVENS CAPSULE STUDY;  Surgeon: Virgel Manifold, MD;  Location: [facility];  Service: Endoscopy;  Laterality: N/A;  . pace maker defib  2016  . PERIPHERAL VASCULAR CATHETERIZATION N/A 10/23/2016   Procedure: Renal Angiography;  Surgeon: Algernon Huxley, MD;  Location: [place] [facility];  Service: Cardiovascular;  Laterality: N/A;  . TOTAL ABDOMINAL HYSTERECTOMY  1999   history of abnormal pap   Family History  Problem Relation Age of Onset  . Arthritis Mother   . Cancer Mother        uterus cancer  . Hyperlipidemia Mother   . Hypertension Mother   .  Heart disease Mother   . Diabetes Mother   . Hyperlipidemia Father   . Hypertension Father   . Heart disease Father   . Diabetes Father   . Cancer Sister        ovary cancer  . Diabetes Maternal Grandmother   . Hypertension Maternal Grandmother   . Arthritis Maternal Grandmother   . Hypertension Maternal Grandfather   . Hypertension Paternal Grandmother   . Hypertension Paternal Grandfather   . Heart disease Paternal Grandfather   . Breast cancer Neg Hx    Social History   Tobacco Use  . Smoking status: Current Every Day Smoker     Packs/day: 0.50    Types: Cigarettes  . Smokeless tobacco: Never Used  . Tobacco comment: 1/2-1 ppd  Substance Use Topics  . Alcohol use: Yes    Alcohol/week: 2.0 standard drinks    Types: 2 Shots of liquor per week   Allergies  Allergen Reactions  . Hydrocodone-Acetaminophen Nausea Only   Prior to Admission medications   Medication Sig Start Date End Date Taking? Authorizing Provider  acetaminophen (TYLENOL) 500 MG tablet Maximum 6 tablets a day. Patient taking differently: Take 500 mg by mouth every 4 (four) hours as needed for fever. Maximum 6 tablets a day. 02/09/15  Yes Doss, Velora Heckler, RN  ADVAIR DISKUS 250-50 MCG/DOSE AEPB INHALE 1 PUFF BY MOUTH TWICE A DAY. RINSE MOUTH AFTER EACH USE 04/09/18  Yes [name], MD  ALPRAZolam (XANAX) 0.5 MG tablet TAKE ONE TABLET BY MOUTH AT BEDTIME AS NEEDED FOR ANXIETY Patient taking differently: Take 0.25-0.5 mg by mouth at bedtime. TAKE ONE TABLET BY MOUTH AT BEDTIME AS NEEDED FOR ANXIETY 05/08/18  Yes [name], MD  aspirin 81 MG tablet Take 81 mg by mouth daily.   Yes [provider]  atorvastatin (LIPITOR) 40 MG tablet TAKE ONE TABLET BY MOUTH EVERY DAY 11/04/18  Yes [name], MD  carvedilol (COREG) 6.25 MG tablet TAKE ONE TABLET BY MOUTH TWICE DAILY WITH MEAL Patient taking differently: Take 6.25 mg by mouth 2 (two) times daily with a meal.  04/09/18  Yes [name], MD  clopidogrel (PLAVIX) 75 MG tablet  07/24/18  Yes [provider]  cyanocobalamin (,VITAMIN B-12,) 1000 MCG/ML injection Inject 1 mL weekly into the muscle for 3 weeks,  Then monthly thereafter 07/29/18  Yes [name], MD  diphenhydrAMINE (DIPHENHIST) 25 mg capsule Take 25 mg by mouth at bedtime.   Yes [provider]  escitalopram (LEXAPRO) 10 MG tablet TAKE ONE TABLET BY MOUTH EVERY DAY 04/09/18  Yes [name], MD  ferrous sulfate 325 (65 FE) MG tablet Take 1 tablet (325 mg total) by mouth 2 (two) times daily with a meal.  07/20/18  Yes Gouru, Aruna, MD  furosemide (LASIX) 40 MG tablet Take 1 tablet (40 mg total) by mouth daily. 09/23/18  Yes [name], MD  ipratropium-albuterol (DUONEB) 0.5-2.5 (3) MG/3ML SOLN Take 3 mLs by nebulization every 6 (six) hours as needed. 08/05/18  Yes [name], MD  lisinopril (PRINIVIL,ZESTRIL) 20 MG tablet TAKE ONE TABLET BY MOUTH EVERY DAY 04/09/18  Yes [name], MD  nitroGLYCERIN (NITROSTAT) 0.4 MG SL tablet Place 1 tablet (0.4 mg total) under the tongue every 5 (five) minutes as needed for chest pain. 06/19/16  Yes [name], MD  omeprazole (PRILOSEC) 40 MG capsule TAKE ONE CAPSULE BY MOUTH EVERY DAY 04/09/18  Yes [name], MD  potassium chloride (K-DUR) 10 MEQ tablet TAKE ONE TABLET BY MOUTH ONCE DAILY 09/23/18  Yes [name], MD  SPIRIVA RESPIMAT 2.5 MCG/ACT AERS ACTIVATE AND INHALE TWO SPRAYS ORALLY INTO THE LUNGS ONCE DAILY. 01/09/18  Yes [name], MD  Syringe/Needle, Disp, (SYRINGE 3CC/25GX1") 25G X 1" 3 ML MISC Use for b12 injections 07/29/18  Yes [name], MD  VENTOLIN HFA 108 (90 Base) MCG/ACT inhaler INHALE 2 PUFFS BY MOUTH INTO THE LUNGS EVERY 6 HOURS AS NEEDED FOR WHEEZING 09/09/18  Yes [name], MD    Review of Systems  Constitutional: Positive for fatigue (easily). Negative for appetite change.  HENT: Negative for congestion, postnasal drip and sore throat.   Eyes: Negative.   Respiratory: Positive for cough (productive of clear mucus), shortness of breath and wheezing (at times). Negative for chest tightness.   Cardiovascular: Negative for chest pain, palpitations and leg swelling.  Gastrointestinal: Negative for abdominal distention and abdominal pain.  Endocrine: Negative.   Genitourinary: Negative.   Musculoskeletal: Negative for back pain and neck pain.  Skin: Negative.   Allergic/Immunologic: Negative.   Neurological: Positive for light-headedness (when changing positions too quickly). Negative for dizziness.   Hematological: Negative for adenopathy. Does not bruise/bleed easily.  Psychiatric/Behavioral: Negative for dysphoric mood and sleep disturbance (sleeping on 1 pillow). The patient is not nervous/anxious.    Vitals:   11/04/18 1238  BP: 117/84  Pulse: 78  Resp: 18  SpO2: 98%  Weight: 169 lb (76.7 kg)  Height: 5\' 4"  (1.626 m)   Wt Readings from Last 3 Encounters:  11/04/18 169 lb (76.7 kg)  08/27/18 168 lb 9.6 oz (76.5 kg)  08/06/18 166 lb 4 oz (75.4 kg)   Lab Results  Component Value Date   CREATININE 1.38 (H) 07/29/2018   CREATININE 1.28 (H) 07/18/2018   CREATININE 1.23 (H) 07/17/2018    Physical Exam Vitals signs and nursing note reviewed.  Constitutional:      Appearance: She is well-developed.  HENT:     Head: Normocephalic and atraumatic.  Neck:     Musculoskeletal: Normal range of motion and neck supple.     Vascular: No JVD.  Cardiovascular:     Rate and Rhythm: Normal rate and regular rhythm.  Pulmonary:     Effort: Pulmonary effort is normal. No respiratory distress.     Breath sounds: Wheezing (upper lobes anteriorally) present. No rales.  Abdominal:     General: There is no distension.     Palpations: Abdomen is soft.  Musculoskeletal:     Right lower leg: She exhibits no tenderness. No edema.     Left lower leg: She exhibits no tenderness. No edema.  Skin:    General: Skin is warm and dry.  Neurological:     Mental Status: She is alert and oriented to person, place, and time.  Psychiatric:        Behavior: Behavior normal.    Assessment & Plan:  1: Chronic heart failure with reduced ejection fraction- - NYHA class III - euvolemic today - recent echo shows reduced EF from 2 years prior - weighing daily and she was instructed to call for an overnight weight gain of >2 pounds or a weekly weight gain of >5 pounds - weight up 3 pounds since last visit here 3 months ago - takes extra furosemide ~ twice / month - not adding salt and has been reading  food labels. Reviewed the importance of closely following a 2000mg  sodium  diet  - finish lisinopril, skip 1 day and then begin entresto 24/26mg  twice daily; voucher given for patient to get the first month free - will check BMP at her next visit - saw cardiology ([place]) 08/09/18 - BNP 07/17/18 was 1730.0 - she has received her flu vaccine for this season  2: HTN- - BP looks good today - saw PCP Derrel Nip) 07/29/18 - BMP done 07/29/18 reviewed and showed sodium 138, potassium 4.1, creatinine 1.38 and GFR 40.75  3: Tobacco use- - smoking 7 cigarettes daily - complete cessation discussed for 3 minutes with her.  Patient did not bring her medications nor a list. Each medication was verbally reviewed with the patient and she was encouraged to bring the bottles to every visit to confirm accuracy of list.  Return in 6 weeks or sooner for any questions/problems before then.

## 2018-11-04 NOTE — Telephone Encounter (Signed)
-----   Message from Martie Lee, LPN sent at 12/75/1700 11:09 AM EST ----- Regarding: reschedule procedure reschedule procedure for balloon enteroscopy

## 2018-11-04 NOTE — Telephone Encounter (Signed)
Pt states she is ready to reschedule her balloon enteroscopy. Any day is good except for 11/08/18, she has a doctor's appointment. Will contact pt as to when this is scheduled.

## 2018-11-06 ENCOUNTER — Encounter: Payer: Self-pay | Admitting: Family

## 2018-11-06 DIAGNOSIS — I5023 Acute on chronic systolic (congestive) heart failure: Secondary | ICD-10-CM | POA: Insufficient documentation

## 2018-11-06 DIAGNOSIS — I5022 Chronic systolic (congestive) heart failure: Secondary | ICD-10-CM | POA: Insufficient documentation

## 2018-11-07 ENCOUNTER — Other Ambulatory Visit: Payer: Self-pay

## 2018-11-07 DIAGNOSIS — D509 Iron deficiency anemia, unspecified: Secondary | ICD-10-CM

## 2018-11-07 NOTE — Telephone Encounter (Signed)
Pt notified that her procedure with Dr. Vicente Males is scheduled for 11/21/2018. Will check with PCP to hold Plavix, although we did get approval for this for same previous procedure that was 09/11/18 and is now 11/21/18.

## 2018-11-08 ENCOUNTER — Encounter: Payer: Self-pay | Admitting: Internal Medicine

## 2018-11-08 ENCOUNTER — Ambulatory Visit (INDEPENDENT_AMBULATORY_CARE_PROVIDER_SITE_OTHER): Payer: Medicare Other

## 2018-11-08 ENCOUNTER — Ambulatory Visit (INDEPENDENT_AMBULATORY_CARE_PROVIDER_SITE_OTHER): Payer: Medicare Other | Admitting: Internal Medicine

## 2018-11-08 VITALS — BP 140/90 | HR 90 | Temp 98.1°F | Wt 168.8 lb

## 2018-11-08 DIAGNOSIS — D500 Iron deficiency anemia secondary to blood loss (chronic): Secondary | ICD-10-CM | POA: Diagnosis not present

## 2018-11-08 DIAGNOSIS — R059 Cough, unspecified: Secondary | ICD-10-CM

## 2018-11-08 DIAGNOSIS — Z01818 Encounter for other preprocedural examination: Secondary | ICD-10-CM

## 2018-11-08 DIAGNOSIS — R05 Cough: Secondary | ICD-10-CM

## 2018-11-08 DIAGNOSIS — R7303 Prediabetes: Secondary | ICD-10-CM | POA: Diagnosis not present

## 2018-11-08 DIAGNOSIS — R197 Diarrhea, unspecified: Secondary | ICD-10-CM

## 2018-11-08 DIAGNOSIS — E7800 Pure hypercholesterolemia, unspecified: Secondary | ICD-10-CM | POA: Diagnosis not present

## 2018-11-08 DIAGNOSIS — D519 Vitamin B12 deficiency anemia, unspecified: Secondary | ICD-10-CM | POA: Diagnosis not present

## 2018-11-08 DIAGNOSIS — Z720 Tobacco use: Secondary | ICD-10-CM

## 2018-11-08 LAB — COMPREHENSIVE METABOLIC PANEL
ALT: 19 U/L (ref 0–35)
AST: 21 U/L (ref 0–37)
Albumin: 4.3 g/dL (ref 3.5–5.2)
Alkaline Phosphatase: 79 U/L (ref 39–117)
BILIRUBIN TOTAL: 0.5 mg/dL (ref 0.2–1.2)
BUN: 22 mg/dL (ref 6–23)
CO2: 25 mEq/L (ref 19–32)
Calcium: 9.6 mg/dL (ref 8.4–10.5)
Chloride: 103 mEq/L (ref 96–112)
Creatinine, Ser: 1.29 mg/dL — ABNORMAL HIGH (ref 0.40–1.20)
GFR: 44.01 mL/min — ABNORMAL LOW (ref >60.00)
Glucose, Bld: 122 mg/dL — ABNORMAL HIGH (ref 70–99)
Potassium: 4 mEq/L (ref 3.5–5.1)
Sodium: 140 mEq/L (ref 135–145)
TOTAL PROTEIN: 7 g/dL (ref 6.0–8.3)

## 2018-11-08 LAB — CBC WITH DIFFERENTIAL/PLATELET
Basophils Absolute: 0.1 10*3/uL (ref 0.0–0.1)
Basophils Relative: 1.1 % (ref 0.0–3.0)
Eosinophils Absolute: 0.2 10*3/uL (ref 0.0–0.7)
Eosinophils Relative: 2 % (ref 0.0–5.0)
HCT: 41.2 % (ref 36.0–46.0)
Hemoglobin: 13.7 g/dL (ref 12.0–15.0)
Lymphocytes Relative: 20.8 % (ref 12.0–46.0)
Lymphs Abs: 1.6 10*3/uL (ref 0.7–4.0)
MCHC: 33.3 g/dL (ref 30.0–36.0)
MCV: 98.8 fl (ref 78.0–100.0)
Monocytes Absolute: 0.9 10*3/uL (ref 0.1–1.0)
Monocytes Relative: 11.8 % (ref 3.0–12.0)
NEUTROS ABS: 4.9 10*3/uL (ref 1.4–7.7)
Neutrophils Relative %: 64.3 % (ref 43.0–77.0)
Platelets: 259 10*3/uL (ref 150.0–400.0)
RBC: 4.17 Mil/uL (ref 3.87–5.11)
RDW: 16.8 % — ABNORMAL HIGH (ref 11.5–15.5)
WBC: 7.7 10*3/uL (ref 4.0–10.5)

## 2018-11-08 LAB — HEMOGLOBIN A1C: Hgb A1c MFr Bld: 5.4 % (ref 4.6–6.5)

## 2018-11-08 LAB — LIPID PANEL
Cholesterol: 148 mg/dL (ref 0–200)
HDL: 39.6 mg/dL (ref >39.00)
Total CHOL/HDL Ratio: 4

## 2018-11-08 LAB — LDL CHOLESTEROL, DIRECT: Direct LDL: 62 mg/dL

## 2018-11-08 LAB — MAGNESIUM: Magnesium: 1.9 mg/dL (ref 1.5–2.5)

## 2018-11-08 LAB — VITAMIN B12: Vitamin B-12: 273 pg/mL (ref 211–911)

## 2018-11-08 MED ORDER — ESCITALOPRAM OXALATE 10 MG PO TABS: 10.0000 mg | ORAL_TABLET | Freq: Every day | ORAL | 1 refills | Status: DC

## 2018-11-08 MED ORDER — OMEPRAZOLE 40 MG PO CPDR: 40.0000 mg | DELAYED_RELEASE_CAPSULE | Freq: Every day | ORAL | 1 refills | Status: DC

## 2018-11-08 MED ORDER — PREDNISONE 10 MG PO TABS: ORAL_TABLET | ORAL | 0 refills | Status: DC

## 2018-11-08 MED ORDER — DOXYCYCLINE HYCLATE 100 MG PO TABS: 100.0000 mg | ORAL_TABLET | Freq: Two times a day (BID) | ORAL | 0 refills | Status: DC

## 2018-11-08 MED ORDER — CYANOCOBALAMIN 1000 MCG/ML IJ SOLN: INTRAMUSCULAR | 0 refills | Status: DC

## 2018-11-08 MED ORDER — POTASSIUM CHLORIDE ER 10 MEQ PO TBCR: 10.0000 meq | EXTENDED_RELEASE_TABLET | Freq: Every day | ORAL | 1 refills | Status: DC

## 2018-11-08 MED ORDER — FUROSEMIDE 40 MG PO TABS: 40.0000 mg | ORAL_TABLET | Freq: Every day | ORAL | 1 refills | Status: DC

## 2018-11-08 MED ORDER — FERROUS SULFATE 325 (65 FE) MG PO TABS: 325.0000 mg | ORAL_TABLET | Freq: Every day | ORAL | 1 refills | Status: DC

## 2018-11-08 NOTE — Progress Notes (Signed)
Subjective:  Patient ID: Ashley [name], female    DOB: 09/27/1951  Age: 68 y.o. MRN: [id number]  CC: The primary encounter diagnosis was Anemia due to vitamin B12 deficiency, unspecified B12 deficiency type. Diagnoses of Iron deficiency anemia due to chronic blood loss, Prediabetes, Diarrhea, unspecified type, Pure hypercholesterolemia, Cough, Tobacco abuse, Cough in adult, and Preoperative evaluation to rule out surgical contraindication were also pertinent to this visit.  HPI Ashley [name] presents for follow up   Needing  medical clearance for a balloon enteroscopy scheduled for jan 16  To cauterize nonbleeding AVMs found on pill camera study done in October to investigate the source of her IDA  patient takes ASA and plavix for CAD with prior stents  (placed in 2015 by Dr. Saralyn Pilar)    Taking lasix 40 mg daily ,  Additional dose prn wt gain  of 2 lbs overnight,  Running out of med early .   Has been coughing for 6 to 8 weeks . Occasionally productive.  Still smoking 7 cigs daily .  Started initially with viral symptoms,  But has not had fevers,  Weight loss   Needs refills on all med s   Outpatient Medications Prior to Visit  Medication Sig Dispense Refill  . acetaminophen (TYLENOL) 500 MG tablet Maximum 6 tablets a day. (Patient taking differently: Take 500 mg by mouth every 4 (four) hours as needed for fever. Maximum 6 tablets a day.) 42 tablet 0  . ADVAIR DISKUS 250-50 MCG/DOSE AEPB INHALE 1 PUFF BY MOUTH TWICE A DAY. RINSE MOUTH AFTER EACH USE 180 each 1  . ALPRAZolam (XANAX) 0.5 MG tablet TAKE ONE TABLET BY MOUTH AT BEDTIME AS NEEDED FOR ANXIETY (Patient taking differently: Take 0.25-0.5 mg by mouth at bedtime. TAKE ONE TABLET BY MOUTH AT BEDTIME AS NEEDED FOR ANXIETY) 30 tablet 5  . aspirin 81 MG tablet Take 81 mg by mouth daily.    [name] atorvastatin (LIPITOR) 40 MG tablet TAKE ONE TABLET BY MOUTH EVERY DAY 90 tablet 1  . carvedilol (COREG) 6.25 MG tablet TAKE ONE TABLET BY  MOUTH TWICE DAILY WITH MEAL (Patient taking differently: Take 6.25 mg by mouth 2 (two) times daily with a meal. ) 180 tablet 1  . clopidogrel (PLAVIX) 75 MG tablet     . diphenhydrAMINE (DIPHENHIST) 25 mg capsule Take 25 mg by mouth at bedtime.    [name] ipratropium-albuterol (DUONEB) 0.5-2.5 (3) MG/3ML SOLN Take 3 mLs by nebulization every 6 (six) hours as needed. 360 mL 1  . nitroGLYCERIN (NITROSTAT) 0.4 MG SL tablet Place 1 tablet (0.4 mg total) under the tongue every 5 (five) minutes as needed for chest pain. 30 tablet 3  . SPIRIVA RESPIMAT 2.5 MCG/ACT AERS ACTIVATE AND INHALE TWO SPRAYS ORALLY INTO THE LUNGS ONCE DAILY. 4 g 0  . Syringe/Needle, Disp, (SYRINGE 3CC/25GX1") 25G X 1" 3 ML MISC Use for b12 injections 50 each 0  . VENTOLIN HFA 108 (90 Base) MCG/ACT inhaler INHALE 2 PUFFS BY MOUTH INTO THE LUNGS EVERY 6 HOURS AS NEEDED FOR WHEEZING 18 g 2  . cyanocobalamin (,VITAMIN B-12,) 1000 MCG/ML injection Inject 1 mL weekly into the muscle for 3 weeks,  Then monthly thereafter 10 mL 0  . escitalopram (LEXAPRO) 10 MG tablet TAKE ONE TABLET BY MOUTH EVERY DAY 90 tablet 1  . ferrous sulfate 325 (65 FE) MG tablet Take 1 tablet (325 mg total) by mouth 2 (two) times daily with a meal.  3  . furosemide (LASIX)  40 MG tablet Take 1 tablet (40 mg total) by mouth daily. 90 tablet 1  . omeprazole (PRILOSEC) 40 MG capsule TAKE ONE CAPSULE BY MOUTH EVERY DAY 90 capsule 1  . potassium chloride (K-DUR) 10 MEQ tablet TAKE ONE TABLET BY MOUTH ONCE DAILY 30 tablet 0  . sacubitril-valsartan (ENTRESTO) 24-26 MG Take 1 tablet by mouth 2 (two) times daily. (Patient not taking: Reported on 11/08/2018) 60 tablet 3   No facility-administered medications prior to visit.     Review of Systems;  Patient denies headache, fevers, malaise, unintentional weight loss, skin rash, eye pain, sinus congestion and sinus pain, sore throat, dysphagia,  hemoptysis , cough, dyspnea, wheezing, chest pain, palpitations, orthopnea, edema,  abdominal pain, nausea, melena, diarrhea, constipation, flank pain, dysuria, hematuria, urinary  Frequency, nocturia, numbness, tingling, seizures,  Focal weakness, Loss of consciousness,  Tremor, insomnia, depression, anxiety, and suicidal ideation.      Objective:  BP 140/90 (BP Location: Left Arm, Patient Position: Sitting, Cuff Size: Normal)   Pulse 90   Temp 98.1 F (36.7 C) (Oral)   Wt 168 lb 12.8 oz (76.6 kg)   SpO2 98%   BMI 28.97 kg/m   BP Readings from Last 3 Encounters:  11/08/18 140/90  11/04/18 117/84  08/27/18 123/73    Wt Readings from Last 3 Encounters:  11/08/18 168 lb 12.8 oz (76.6 kg)  11/04/18 169 lb (76.7 kg)  08/27/18 168 lb 9.6 oz (76.5 kg)    General appearance: alert, cooperative and appears stated age Ears: normal TM's and external ear canals both ears Throat: lips, mucosa, and tongue normal; teeth and gums normal Neck: no adenopathy, no carotid bruit, supple, symmetrical, trachea midline and thyroid not enlarged, symmetric, no tenderness/mass/nodules Back: symmetric, no curvature. ROM normal. No CVA tenderness. Lungs: clear to auscultation bilaterally Heart: regular rate and rhythm, S1, S2 normal, no murmur, click, rub or gallop Abdomen: soft, non-tender; bowel sounds normal; no masses,  no organomegaly Pulses: 2+ and symmetric Skin: Skin color, texture, turgor normal. No rashes or lesions Lymph nodes: Cervical, supraclavicular, and axillary nodes normal.  Lab Results  Component Value Date   HGBA1C 5.4 11/08/2018   HGBA1C 6.2 05/06/2018   HGBA1C 5.7 06/21/2016    Lab Results  Component Value Date   CREATININE 1.29 (H) 11/08/2018   CREATININE 1.38 (H) 07/29/2018   CREATININE 1.28 (H) 07/18/2018    Lab Results  Component Value Date   WBC 7.7 11/08/2018   HGB 13.7 11/08/2018   HCT 41.2 11/08/2018   PLT 259.0 11/08/2018   GLUCOSE 122 (H) 11/08/2018   CHOL 148 11/08/2018   TRIG (H) 11/08/2018    550.0 Triglyceride is over 400;  calculations on Lipids are invalid.   HDL 39.60 11/08/2018   LDLDIRECT 62.0 11/08/2018   LDLCALC 139 (H) 05/06/2018   ALT 19 11/08/2018   AST 21 11/08/2018   NA 140 11/08/2018   K 4.0 11/08/2018   CL 103 11/08/2018   CREATININE 1.29 (H) 11/08/2018   BUN 22 11/08/2018   CO2 25 11/08/2018   TSH 1.791 07/17/2018   INR 1.1 08/19/2014   HGBA1C 5.4 11/08/2018    No results found.  Assessment & Plan:   Problem List Items Addressed This Visit    Anemia, iron deficiency    Secondary to AVMs.  Now resolved.  Patient advised to stop iron supplements.  For enteroscopy later this month to cauterize AVMs  Lab Results  Component Value Date   IRON 396 (H) 11/08/2018  TIBC 406 11/08/2018   FERRITIN 34 11/08/2018   Lab Results  Component Value Date   WBC 7.7 11/08/2018   HGB 13.7 11/08/2018   HCT 41.2 11/08/2018   MCV 98.8 11/08/2018   PLT 259.0 11/08/2018         Relevant Medications   cyanocobalamin (,VITAMIN B-12,) 1000 MCG/ML injection   Other Relevant Orders   CBC with Differential/Platelet (Completed)   Iron, TIBC and Ferritin Panel (Completed)   B12 deficiency anemia - Primary    Her level remains below 300, because she has not been coming for her injections as directed. She has requested home injections to be done by a family member.       Relevant Medications   cyanocobalamin (,VITAMIN B-12,) 1000 MCG/ML injection   Other Relevant Orders   Vitamin B12 (Completed)   Cough in adult    Present for 6 to 8 weeks  Chest x ray without infiltrate.  Still smoking. Will treat as COPD exacerbation and prescribe doxycycline and prednisone taper. Add benadryl for PND<  Already taking a PPI and inhaled steroid/broncodilator       Prediabetes   Relevant Orders   Hemoglobin A1c (Completed)   Comprehensive metabolic panel (Completed)   Preoperative evaluation to rule out surgical contraindication    Patient  is considered to be at moderate w risk  For perioperative  complications  Based on  Her history of CAD and COPD.  However, the risks of continued bleeding outweigh the risks of the procedure.  She will stop her asa and plavix on Jan 7 and resume on Jan 19.      Tobacco abuse    ongoing despite  Known CAD with prior stent placement in 2015 and PAD  With renal artery stenosis.  She is well aware of the risks of continued tobacco abuse,  Which include but are not limited to coronary artery stent closure and lung CA        Other Visit Diagnoses    Diarrhea, unspecified type       Relevant Orders   Magnesium (Completed)   Pure hypercholesterolemia       Relevant Medications   furosemide (LASIX) 40 MG tablet   Other Relevant Orders   Lipid panel (Completed)   Cough       Relevant Orders   DG Chest 2 View (Completed)      I have discontinued Talaysia A. Sevey "Debbie"'s ferrous sulfate and ferrous sulfate. I have also changed her furosemide, cyanocobalamin, escitalopram, omeprazole, and potassium chloride. Additionally, I am having her start on doxycycline and predniSONE. Lastly, I am having her maintain her aspirin, acetaminophen, nitroGLYCERIN, SPIRIVA RESPIMAT, ADVAIR DISKUS, carvedilol, ALPRAZolam, diphenhydrAMINE, clopidogrel, SYRINGE 3CC/25GX1", ipratropium-albuterol, VENTOLIN HFA, atorvastatin, and sacubitril-valsartan.  Meds ordered this encounter  Medications  . furosemide (LASIX) 40 MG tablet    Sig: Take 1 tablet (40 mg total) by mouth daily. 2 for weight gain > 2 lbs overnight    Dispense:  135 tablet    Refill:  1  . doxycycline (VIBRA-TABS) 100 MG tablet    Sig: Take 1 tablet (100 mg total) by mouth 2 (two) times daily.    Dispense:  20 tablet    Refill:  0  . predniSONE (DELTASONE) 10 MG tablet    Sig: 6 tablets on Day 1 , then reduce by 1 tablet daily until gone    Dispense:  21 tablet    Refill:  0  . cyanocobalamin (,VITAMIN B-12,) 1000 MCG/ML  injection    Sig: Inject 1 mL weekly into the muscle monthly    Dispense:  10  mL    Refill:  0  . escitalopram (LEXAPRO) 10 MG tablet    Sig: Take 1 tablet (10 mg total) by mouth daily.    Dispense:  90 tablet    Refill:  1    This prescription was filled on 01/08/2018. Any refills authorized will be placed on file.  [name] DISCONTD: ferrous sulfate 325 (65 FE) MG tablet    Sig: Take 1 tablet (325 mg total) by mouth daily with breakfast.    Dispense:  90 tablet    Refill:  1  . omeprazole (PRILOSEC) 40 MG capsule    Sig: Take 1 capsule (40 mg total) by mouth daily.    Dispense:  90 capsule    Refill:  1    This prescription was filled on 01/08/2018. Any refills authorized will be placed on file.  . potassium chloride (K-DUR) 10 MEQ tablet    Sig: Take 1 tablet (10 mEq total) by mouth daily.    Dispense:  90 tablet    Refill:  1   A total of 25 minutes of face to face time was spent with patient more than half of which was spent in counselling about the above mentioned conditions  and coordination of care   Medications Discontinued During This Encounter  Medication Reason  . furosemide (LASIX) 40 MG tablet   . cyanocobalamin (,VITAMIN B-12,) 1000 MCG/ML injection Reorder  . escitalopram (LEXAPRO) 10 MG tablet Reorder  . ferrous sulfate 325 (65 FE) MG tablet Reorder  . omeprazole (PRILOSEC) 40 MG capsule Reorder  . potassium chloride (K-DUR) 10 MEQ tablet Reorder  . ferrous sulfate 325 (65 FE) MG tablet     Follow-up: Return in about 6 months (around 05/09/2019).   [name], MD

## 2018-11-08 NOTE — Patient Instructions (Addendum)
I am prescribing doxycycline and prednisone taper for one week   For your postnasal drip  You can continue to use Benadryl at night, but you should add  one of these newer second generation antihistamines  For daytime use  Generic  Zyrtec, which is cetirizine.    generic Allegra , available generically as fexofenadine  180 mg once daily strengths.    Generic Claritin :  also available as loratidine,  10 mg   Daily use of a probiotic advised for 3 weeks.   STOP YOUR ASPIRIN AND PLAVIX ON January 7  AND RESUME ON January 19    .

## 2018-11-09 LAB — IRON,TIBC AND FERRITIN PANEL
%SAT: 98 % (calc) — ABNORMAL HIGH (ref 16–45)
FERRITIN: 34 ng/mL (ref 16–288)
Iron: 396 ug/dL — ABNORMAL HIGH (ref 45–160)
TIBC: 406 mcg/dL (calc) (ref 250–450)

## 2018-11-10 ENCOUNTER — Encounter: Payer: Self-pay | Admitting: Internal Medicine

## 2018-11-10 DIAGNOSIS — D509 Iron deficiency anemia, unspecified: Secondary | ICD-10-CM | POA: Insufficient documentation

## 2018-11-10 NOTE — Assessment & Plan Note (Signed)
Patient  is considered to be at moderate w risk  For perioperative complications  Based on  Her history of CAD and COPD.  However, the risks of continued bleeding outweigh the risks of the procedure.  She will stop her asa and plavix on Jan 7 and resume on Jan 19.

## 2018-11-10 NOTE — Assessment & Plan Note (Addendum)
Present for 6 to 8 weeks  Chest x ray without infiltrate.  Still smoking. Will treat as COPD exacerbation and prescribe doxycycline and prednisone taper. Add benadryl for PND<  Already taking a PPI and inhaled steroid/broncodilator

## 2018-11-10 NOTE — Assessment & Plan Note (Signed)
Her level remains below 300, because she has not been coming for her injections as directed. She has requested home injections to be done by a family member.

## 2018-11-10 NOTE — Assessment & Plan Note (Signed)
ongoing despite  Known CAD with prior stent placement in 2015 and PAD  With renal artery stenosis.  She is well aware of the risks of continued tobacco abuse,  Which include but are not limited to coronary artery stent closure and lung CA

## 2018-11-10 NOTE — Assessment & Plan Note (Signed)
Secondary to AVMs.  Now resolved.  Patient advised to stop iron supplements.  For enteroscopy later this month to cauterize AVMs  Lab Results  Component Value Date   IRON 396 (H) 11/08/2018   TIBC 406 11/08/2018   FERRITIN 34 11/08/2018   Lab Results  Component Value Date   WBC 7.7 11/08/2018   HGB 13.7 11/08/2018   HCT 41.2 11/08/2018   MCV 98.8 11/08/2018   PLT 259.0 11/08/2018

## 2018-11-11 DIAGNOSIS — I10 Essential (primary) hypertension: Secondary | ICD-10-CM | POA: Diagnosis not present

## 2018-11-11 DIAGNOSIS — I214 Non-ST elevation (NSTEMI) myocardial infarction: Secondary | ICD-10-CM | POA: Diagnosis not present

## 2018-11-11 DIAGNOSIS — J449 Chronic obstructive pulmonary disease, unspecified: Secondary | ICD-10-CM | POA: Diagnosis not present

## 2018-11-11 DIAGNOSIS — I5022 Chronic systolic (congestive) heart failure: Secondary | ICD-10-CM | POA: Diagnosis not present

## 2018-11-11 DIAGNOSIS — Z720 Tobacco use: Secondary | ICD-10-CM | POA: Diagnosis not present

## 2018-11-11 DIAGNOSIS — Z951 Presence of aortocoronary bypass graft: Secondary | ICD-10-CM | POA: Diagnosis not present

## 2018-11-11 DIAGNOSIS — Z95810 Presence of automatic (implantable) cardiac defibrillator: Secondary | ICD-10-CM | POA: Diagnosis not present

## 2018-11-11 DIAGNOSIS — E785 Hyperlipidemia, unspecified: Secondary | ICD-10-CM | POA: Diagnosis not present

## 2018-11-11 DIAGNOSIS — I150 Renovascular hypertension: Secondary | ICD-10-CM | POA: Diagnosis not present

## 2018-11-20 ENCOUNTER — Telehealth: Payer: Self-pay

## 2018-11-20 ENCOUNTER — Encounter: Payer: Self-pay | Admitting: Emergency Medicine

## 2018-11-20 NOTE — Telephone Encounter (Signed)
Pt has been sick, and under Dr. Lupita Dawn care and does feel better and does plan on doing her balloon enteroscopy tomorrow. Has held plavix and asa x7 days and aware to restart blood thinner 3 days after the procedure.

## 2018-11-21 ENCOUNTER — Encounter: Payer: Self-pay | Admitting: *Deleted

## 2018-11-21 ENCOUNTER — Ambulatory Visit
Admission: RE | Admit: 2018-11-21 | Discharge: 2018-11-21 | Disposition: A | Discharge status: Home / Self Care | Payer: Medicare Other | Attending: Gastroenterology | Admitting: Gastroenterology

## 2018-11-21 ENCOUNTER — Encounter
Admission: RE | Disposition: A | Discharge status: Home / Self Care | Payer: Self-pay | Source: Home / Self Care | Attending: Gastroenterology

## 2018-11-21 ENCOUNTER — Ambulatory Visit: Payer: Medicare Other | Admitting: Anesthesiology

## 2018-11-21 DIAGNOSIS — F17210 Nicotine dependence, cigarettes, uncomplicated: Secondary | ICD-10-CM | POA: Insufficient documentation

## 2018-11-21 DIAGNOSIS — N189 Chronic kidney disease, unspecified: Secondary | ICD-10-CM | POA: Diagnosis not present

## 2018-11-21 DIAGNOSIS — K31819 Angiodysplasia of stomach and duodenum without bleeding: Secondary | ICD-10-CM | POA: Diagnosis not present

## 2018-11-21 DIAGNOSIS — Z85038 Personal history of other malignant neoplasm of large intestine: Secondary | ICD-10-CM | POA: Insufficient documentation

## 2018-11-21 DIAGNOSIS — I5042 Chronic combined systolic (congestive) and diastolic (congestive) heart failure: Secondary | ICD-10-CM | POA: Diagnosis not present

## 2018-11-21 DIAGNOSIS — Z7982 Long term (current) use of aspirin: Secondary | ICD-10-CM | POA: Insufficient documentation

## 2018-11-21 DIAGNOSIS — Z951 Presence of aortocoronary bypass graft: Secondary | ICD-10-CM | POA: Diagnosis not present

## 2018-11-21 DIAGNOSIS — I509 Heart failure, unspecified: Secondary | ICD-10-CM | POA: Insufficient documentation

## 2018-11-21 DIAGNOSIS — I739 Peripheral vascular disease, unspecified: Secondary | ICD-10-CM | POA: Insufficient documentation

## 2018-11-21 DIAGNOSIS — J449 Chronic obstructive pulmonary disease, unspecified: Secondary | ICD-10-CM | POA: Diagnosis not present

## 2018-11-21 DIAGNOSIS — D500 Iron deficiency anemia secondary to blood loss (chronic): Secondary | ICD-10-CM | POA: Diagnosis not present

## 2018-11-21 DIAGNOSIS — I252 Old myocardial infarction: Secondary | ICD-10-CM | POA: Insufficient documentation

## 2018-11-21 DIAGNOSIS — E785 Hyperlipidemia, unspecified: Secondary | ICD-10-CM | POA: Diagnosis not present

## 2018-11-21 DIAGNOSIS — Z7902 Long term (current) use of antithrombotics/antiplatelets: Secondary | ICD-10-CM | POA: Insufficient documentation

## 2018-11-21 DIAGNOSIS — F329 Major depressive disorder, single episode, unspecified: Secondary | ICD-10-CM | POA: Diagnosis not present

## 2018-11-21 DIAGNOSIS — K6389 Other specified diseases of intestine: Secondary | ICD-10-CM | POA: Diagnosis not present

## 2018-11-21 DIAGNOSIS — I130 Hypertensive heart and chronic kidney disease with heart failure and stage 1 through stage 4 chronic kidney disease, or unspecified chronic kidney disease: Secondary | ICD-10-CM | POA: Insufficient documentation

## 2018-11-21 DIAGNOSIS — I2510 Atherosclerotic heart disease of native coronary artery without angina pectoris: Secondary | ICD-10-CM | POA: Insufficient documentation

## 2018-11-21 DIAGNOSIS — Z95810 Presence of automatic (implantable) cardiac defibrillator: Secondary | ICD-10-CM | POA: Insufficient documentation

## 2018-11-21 DIAGNOSIS — J439 Emphysema, unspecified: Secondary | ICD-10-CM | POA: Diagnosis not present

## 2018-11-21 DIAGNOSIS — Z79899 Other long term (current) drug therapy: Secondary | ICD-10-CM | POA: Diagnosis not present

## 2018-11-21 DIAGNOSIS — E7800 Pure hypercholesterolemia, unspecified: Secondary | ICD-10-CM | POA: Diagnosis not present

## 2018-11-21 DIAGNOSIS — K5520 Angiodysplasia of colon without hemorrhage: Secondary | ICD-10-CM | POA: Diagnosis not present

## 2018-11-21 DIAGNOSIS — D509 Iron deficiency anemia, unspecified: Secondary | ICD-10-CM | POA: Diagnosis not present

## 2018-11-21 DIAGNOSIS — K219 Gastro-esophageal reflux disease without esophagitis: Secondary | ICD-10-CM | POA: Diagnosis not present

## 2018-11-21 HISTORY — PX: ENTEROSCOPY: SHX5533

## 2018-11-21 SURGERY — ENTEROSCOPY
Anesthesia: General

## 2018-11-21 MED ORDER — SODIUM CHLORIDE 0.9 % IV SOLN
INTRAVENOUS | Status: DC
  Administered 2018-11-21: 1000 mL via INTRAVENOUS
  Administered 2018-11-21: 12:00:00 via INTRAVENOUS

## 2018-11-21 MED ORDER — GLUCAGON HCL RDNA (DIAGNOSTIC) 1 MG IJ SOLR
INTRAMUSCULAR | Status: AC
  Filled 2018-11-21: qty 1

## 2018-11-21 MED ORDER — PHENYLEPHRINE HCL 10 MG/ML IJ SOLN
INTRAMUSCULAR | Status: DC | PRN
  Administered 2018-11-21: 100 ug via INTRAVENOUS
  Administered 2018-11-21: 50 ug via INTRAVENOUS

## 2018-11-21 MED ORDER — PROPOFOL 10 MG/ML IV BOLUS
INTRAVENOUS | Status: DC | PRN
  Administered 2018-11-21 (×2): 30 mg via INTRAVENOUS
  Administered 2018-11-21: 50 mg via INTRAVENOUS
  Administered 2018-11-21: 30 mg via INTRAVENOUS

## 2018-11-21 MED ORDER — PROPOFOL 10 MG/ML IV BOLUS
INTRAVENOUS | Status: AC
  Filled 2018-11-21: qty 40

## 2018-11-21 MED ORDER — LIDOCAINE HCL (CARDIAC) PF 100 MG/5ML IV SOSY
PREFILLED_SYRINGE | INTRAVENOUS | Status: DC | PRN
  Administered 2018-11-21: 60 mg via INTRATRACHEAL

## 2018-11-21 MED ORDER — PROPOFOL 500 MG/50ML IV EMUL
INTRAVENOUS | Status: DC | PRN
  Administered 2018-11-21: 100 ug/kg/min via INTRAVENOUS

## 2018-11-21 MED ORDER — LIDOCAINE HCL (PF) 2 % IJ SOLN
INTRAMUSCULAR | Status: AC
  Filled 2018-11-21: qty 10

## 2018-11-21 NOTE — Anesthesia Preprocedure Evaluation (Addendum)
Anesthesia Evaluation  Patient identified by MRN, date of birth, ID band Patient awake    Reviewed: Allergy & Precautions, H&P , NPO status , Patient's Chart, lab work & pertinent test results  Airway Mallampati: II       Dental  (+) Missing, Poor Dentition   Pulmonary shortness of breath and with exertion, COPD,  COPD inhaler, Recent URI  (treated with antibiotics.  Pt states she feels back to baseline), Resolved, Current Smoker,    + rhonchi        Cardiovascular hypertension, + CAD, + Past MI, + CABG, + Peripheral Vascular Disease and +CHF  + pacemaker + Cardiac Defibrillator + Valvular Problems/Murmurs (MR s/p MVR with CABG)   Treated for CHF exacerbation in August and September 2019  Echo 08/22/18:  MODERATE LV SYSTOLIC DYSFUNCTION WITH MILD LVH NORMAL RIGHT VENTRICULAR SYSTOLIC FUNCTION NO VALVULAR STENOSIS MODERATE MR MILD TR TRIVIAL AR EF 30%   Neuro/Psych PSYCHIATRIC DISORDERS Depression negative neurological ROS     GI/Hepatic Neg liver ROS, GERD  Controlled,  Endo/Other  negative endocrine ROS  Renal/GU CRFRenal disease  negative genitourinary   Musculoskeletal   Abdominal   Peds  Hematology  (+) Blood dyscrasia, anemia ,   Anesthesia Other Findings Past Medical History: No date: AICD (automatic cardioverter/defibrillator) present     Comment:  on right side No date: Anemia No date: CAD (coronary artery disease)     Comment:  s/p CABG No date: CHF (congestive heart failure) (HCC) No date: Chronic kidney disease     Comment:  renal artery stenosis No date: Colon cancer ([place]) No date: COPD (chronic obstructive pulmonary disease) (HCC) No date: Depression No date: Dyspnea No date: GERD (gastroesophageal reflux disease) No date: Hx of colonic polyps No date: Hyperlipidemia No date: Hypertension No date: Mitral valve disorder     Comment:  s/p mitral valve repair wth CABG No date: Myocardial  infarction ([place]) No date: Peripheral vascular disease (HCC) No date: Presence of permanent cardiac pacemaker     Comment:  Pacemaker/ Defibrillator  Past Surgical History: No date: arm surgery     Comment:  fracture, has plates and screws No date: CABG with mitral valve repair No date: CARDIAC CATHETERIZATION No date: COLON SURGERY     Comment:  colon cancer 10/09/2016: COLONOSCOPY WITH PROPOFOL; N/A     Comment:  Procedure: COLONOSCOPY WITH PROPOFOL;  Surgeon: Jonathon Bellows, MD;  Location: [facility];  Service: Endoscopy;              Laterality: N/A; 07/24/2018: COLONOSCOPY WITH PROPOFOL; N/A     Comment:  Procedure: COLONOSCOPY WITH PROPOFOL;  Surgeon:               Virgel Manifold, MD;  Location: [facility];                Service: Endoscopy;  Laterality: N/A; No date: CORONARY ARTERY BYPASS GRAFT 07/24/2018: ESOPHAGOGASTRODUODENOSCOPY (EGD) WITH PROPOFOL; N/A     Comment:  Procedure: ESOPHAGOGASTRODUODENOSCOPY (EGD) WITH               PROPOFOL;  Surgeon: Virgel Manifold, MD;  Location:               [facility] ENDOS[facility];  Service: Endoscopy;  Laterality: N/A; 08/16/2018: GIVENS CAPSULE STUDY; N/A     Comment:  Procedure: GIVENS CAPSULE STUDY;  Surgeon: Bonna Gains,  Lennette Bihari, MD;  Location: [facility];  Service:               Endoscopy;  Laterality: N/A; 2016: pace maker defib 10/23/2016: PERIPHERAL VASCULAR CATHETERIZATION; N/A     Comment:  Procedure: Renal Angiography;  Surgeon: Algernon Huxley, MD;              Location: [place] [facility];  Service: Cardiovascular;              Laterality: N/A; 1999: TOTAL ABDOMINAL HYSTERECTOMY     Comment:  history of abnormal pap  BMI    Body Mass Index:  28.84 kg/m      Reproductive/Obstetrics negative OB ROS                          Anesthesia Physical Anesthesia Plan  ASA: IV  Anesthesia Plan: General   Post-op Pain Management:    Induction:   PONV Risk  Score and Plan: Propofol infusion and TIVA  Airway Management Planned:   Additional Equipment:   Intra-op Plan:   Post-operative Plan:   Informed Consent: I have reviewed the patients History and Physical, chart, labs and discussed the procedure including the risks, benefits and alternatives for the proposed anesthesia with the patient or authorized representative who has indicated his/her understanding and acceptance.     Dental Advisory Given  Plan Discussed with: Anesthesiologist  Anesthesia Plan Comments:         Anesthesia Quick Evaluation

## 2018-11-21 NOTE — Op Note (Signed)
[facility] Patient Name: Ashley [name] Procedure Date: 11/21/2018 11:32 AM MRN: [id number] Account #: [id number] Date of Birth: May 08, 1951 Admit Type: Outpatient Age: 68 Room: [facility] ENDO ROOM 3 Gender: Female Note Status: Finalized Procedure:            Small bowel enteroscopy Indications:          Iron deficiency anemia secondary to chronic blood loss,                        Iron deficiency anemia Providers:            Jonathon Bellows MD, MD Referring MD:         Deborra Medina, MD (Referring MD) Medicines:            Monitored Anesthesia Care Complications:        No immediate complications. Procedure:            Pre-Anesthesia Assessment:                       - Prior to the procedure, a History and Physical was                        performed, and patient medications, allergies and                        sensitivities were reviewed. The patient's tolerance of                        previous anesthesia was reviewed.                       - The risks and benefits of the procedure and the                        sedation options and risks were discussed with the                        patient. All questions were answered and informed                        consent was obtained.                       - ASA Grade Assessment: III - A patient with severe                        systemic disease.                       After obtaining informed consent, the endoscope was                        passed under direct vision. Throughout the procedure,                        the patient's blood pressure, pulse, and oxygen                        saturations were monitored continuously. The  Colonoscope was introduced through the mouth and                        advanced to the proximal jejunum. The small bowel                        enteroscopy was accomplished with ease. The patient                        tolerated the procedure  well. Findings:      Six angioectasias with no bleeding were found in the proximal jejunum.       Coagulation for bleeding prevention using argon plasma at 0.5       liters/minute and 20 watts was successful.      The esophagus was normal.      The stomach was normal.      The examined duodenum was normal. 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                        Treated with argon plasma coagulation (APC).                       - Normal esophagus.                       - Normal stomach.                       - Normal examined duodenum.                       - No specimens collected. Recommendation:       - Discharge patient to home (with escort).                       - Advance diet as tolerated.                       - Use Prilosec (omeprazole) 20 mg PO BID for 4 weeks. Procedure Code(s):    --- Professional ---                       [phone], Small intestinal endoscopy, enteroscopy beyond                        second portion of duodenum, not including ileum; with                        control of bleeding (eg, injection, bipolar cautery,                        unipolar cautery, laser, heater probe, stapler, plasma                        coagulator) Diagnosis Code(s):    --- Professional ---                       K55.20, Angiodysplasia of colon without hemorrhage  D50.0, Iron deficiency anemia secondary to blood loss                        (chronic)                       D50.9, Iron deficiency anemia, unspecified CPT copyright 2018 American Medical Association. All rights reserved. The codes documented in this report are preliminary and upon coder review may  be revised to meet current compliance requirements. Jonathon Bellows, MD Jonathon Bellows MD, MD 11/21/2018 12:05:34 PM This report has been signed electronically. Number of Addenda: 0 Note Initiated On: 11/21/2018 11:32 AM      [facility]

## 2018-11-21 NOTE — Transfer of Care (Signed)
Immediate Anesthesia Transfer of Care Note  [name]  Procedure(s) Performed: ENTEROSCOPY-BALLOON (N/A )  Patient Location: Endoscopy Unit  Anesthesia Type:General  Level of Consciousness: drowsy  Airway & Oxygen Therapy: Patient Spontanous Breathing and Patient connected to nasal cannula oxygen  Post-op Assessment: Report given to RN and Post -op Vital signs reviewed and stable  Post vital signs: stable  Last Vitals:  Vitals Value Taken Time  BP 95/76 11/21/2018 12:12 PM  Temp 36.2 C 11/21/2018 12:09 PM  Pulse 71 11/21/2018 12:15 PM  Resp 21 11/21/2018 12:15 PM  SpO2 99 % 11/21/2018 12:15 PM  Vitals shown include unvalidated device data.  Last Pain:  Vitals:   11/21/18 1209  TempSrc: Tympanic  PainSc: 0-No pain         Complications: No apparent anesthesia complications

## 2018-11-21 NOTE — Anesthesia Post-op Follow-up Note (Signed)
Anesthesia QCDR form completed.        

## 2018-11-21 NOTE — H&P (Signed)
Jonathon Bellows, MD [address] [place], [place], [place], [address] [address], [place], [place], [place], [address] Phone: [phone]  Fax: [phone]  Primary Care Physician:  [name], MD   Pre-Procedure History & Physical: HPI:  Ashley [name] is a 68 y.o. female is here for a push/baloon enteroscopy   Past Medical History:  Diagnosis Date  . AICD (automatic cardioverter/defibrillator) present    on right side  . Anemia   . CAD (coronary artery disease)    s/p CABG  . CHF (congestive heart failure) ([place])   . Chronic kidney disease    renal artery stenosis  . Colon cancer ([place])   . COPD (chronic obstructive pulmonary disease) ([place])   . Depression   . Dyspnea   . GERD (gastroesophageal reflux disease)   . Hx of colonic polyps   . Hyperlipidemia   . Hypertension   . Mitral valve disorder    s/p mitral valve repair wth CABG  . Myocardial infarction ([place])   . Peripheral vascular disease ([place])   . Presence of permanent cardiac pacemaker    Pacemaker/ Defibrillator    Past Surgical History:  Procedure Laterality Date  . arm surgery     fracture, has plates and screws  . CABG with mitral valve repair    . CARDIAC CATHETERIZATION    . COLON SURGERY     colon cancer  . COLONOSCOPY WITH PROPOFOL N/A 10/09/2016   Procedure: COLONOSCOPY WITH PROPOFOL;  Surgeon: Jonathon Bellows, MD;  Location: [facility];  Service: Endoscopy;  Laterality: N/A;  . COLONOSCOPY WITH PROPOFOL N/A 07/24/2018   Procedure: COLONOSCOPY WITH PROPOFOL;  Surgeon: Virgel Manifold, MD;  Location: [facility];  Service: Endoscopy;  Laterality: N/A;  . CORONARY ARTERY BYPASS GRAFT    . ESOPHAGOGASTRODUODENOSCOPY (EGD) WITH PROPOFOL N/A 07/24/2018   Procedure: ESOPHAGOGASTRODUODENOSCOPY (EGD) WITH PROPOFOL;  Surgeon: Virgel Manifold, MD;  Location: [facility];  Service: Endoscopy;  Laterality: N/A;  . GIVENS CAPSULE STUDY N/A 08/16/2018   Procedure: GIVENS CAPSULE  STUDY;  Surgeon: Virgel Manifold, MD;  Location: [facility];  Service: Endoscopy;  Laterality: N/A;  . pace maker defib  2016  . PERIPHERAL VASCULAR CATHETERIZATION N/A 10/23/2016   Procedure: Renal Angiography;  Surgeon: Algernon Huxley, MD;  Location: [place] [facility];  Service: Cardiovascular;  Laterality: N/A;  . TOTAL ABDOMINAL HYSTERECTOMY  1999   history of abnormal pap    Prior to Admission medications   Medication Sig Start Date End Date Taking? Authorizing Provider  acetaminophen (TYLENOL) 500 MG tablet Maximum 6 tablets a day. Patient taking differently: Take 500 mg by mouth every 4 (four) hours as needed for fever. Maximum 6 tablets a day. 02/09/15  Yes Doss, Velora Heckler, RN  ADVAIR DISKUS 250-50 MCG/DOSE AEPB INHALE 1 PUFF BY MOUTH TWICE A DAY. RINSE MOUTH AFTER EACH USE 04/09/18  Yes [name], MD  ALPRAZolam (XANAX) 0.5 MG tablet TAKE ONE TABLET BY MOUTH AT BEDTIME AS NEEDED FOR ANXIETY Patient taking differently: Take 0.25-0.5 mg by mouth at bedtime. TAKE ONE TABLET BY MOUTH AT BEDTIME AS NEEDED FOR ANXIETY 05/08/18  Yes [name], MD  aspirin 81 MG tablet Take 81 mg by mouth daily.   Yes [provider]  atorvastatin (LIPITOR) 40 MG tablet TAKE ONE TABLET BY MOUTH EVERY DAY 11/04/18  Yes [name], MD  carvedilol (COREG) 6.25 MG tablet TAKE ONE TABLET BY MOUTH TWICE DAILY WITH  MEAL Patient taking differently: Take 6.25 mg by mouth 2 (two) times daily with a meal.  04/09/18  Yes [name], MD  clopidogrel (PLAVIX) 75 MG tablet  07/24/18  Yes [provider]  cyanocobalamin (,VITAMIN B-12,) 1000 MCG/ML injection Inject 1 mL weekly into the muscle monthly 11/08/18  Yes [name], MD  diphenhydrAMINE (DIPHENHIST) 25 mg capsule Take 25 mg by mouth at bedtime.   Yes [provider]  escitalopram (LEXAPRO) 10 MG tablet Take 1 tablet (10 mg total) by mouth daily. 11/08/18  Yes [name], MD  furosemide (LASIX) 40 MG tablet Take 1  tablet (40 mg total) by mouth daily. 2 for weight gain > 2 lbs overnight 11/08/18  Yes [name], MD  omeprazole (PRILOSEC) 40 MG capsule Take 1 capsule (40 mg total) by mouth daily. 11/08/18  Yes [name], MD  VENTOLIN HFA 108 (90 Base) MCG/ACT inhaler INHALE 2 PUFFS BY MOUTH INTO THE LUNGS EVERY 6 HOURS AS NEEDED FOR WHEEZING 09/09/18  Yes [name], MD  doxycycline (VIBRA-TABS) 100 MG tablet Take 1 tablet (100 mg total) by mouth 2 (two) times daily. Patient not taking: Reported on 11/21/2018 11/08/18   [name], MD  ipratropium-albuterol (DUONEB) 0.5-2.5 (3) MG/3ML SOLN Take 3 mLs by nebulization every 6 (six) hours as needed. 08/05/18   [name], MD  nitroGLYCERIN (NITROSTAT) 0.4 MG SL tablet Place 1 tablet (0.4 mg total) under the tongue every 5 (five) minutes as needed for chest pain. 06/19/16   [name], MD  potassium chloride (K-DUR) 10 MEQ tablet Take 1 tablet (10 mEq total) by mouth daily. Patient not taking: Reported on 11/21/2018 11/08/18   [name], MD  predniSONE (DELTASONE) 10 MG tablet 6 tablets on Day 1 , then reduce by 1 tablet daily until gone 11/08/18   [name], MD  sacubitril-valsartan (ENTRESTO) 24-26 MG Take 1 tablet by mouth 2 (two) times daily. Patient not taking: Reported on 11/08/2018 11/04/18   Darylene Price A, FNP  SPIRIVA RESPIMAT 2.5 MCG/ACT AERS ACTIVATE AND INHALE TWO SPRAYS ORALLY INTO THE LUNGS ONCE DAILY. Patient not taking: Reported on 11/21/2018 01/09/18   [name], MD  Syringe/Needle, Disp, (SYRINGE 3CC/25GX1") 25G X 1" 3 ML MISC Use for b12 injections 07/29/18   [name], MD    Allergies as of 11/07/2018 - Review Complete 11/06/2018  Allergen Reaction Noted  . Hydrocodone-acetaminophen Nausea Only 08/25/2014    Family History  Problem Relation Age of Onset  . Arthritis Mother   . Cancer Mother        uterus cancer  . Hyperlipidemia Mother   . Hypertension Mother   . Heart disease Mother   . Diabetes  Mother   . Hyperlipidemia Father   . Hypertension Father   . Heart disease Father   . Diabetes Father   . Cancer Sister        ovary cancer  . Diabetes Maternal Grandmother   . Hypertension Maternal Grandmother   . Arthritis Maternal Grandmother   . Hypertension Maternal Grandfather   . Hypertension Paternal Grandmother   . Hypertension Paternal Grandfather   . Heart disease Paternal Grandfather   . Breast cancer Neg Hx     Social History   Socioeconomic History  . Marital status: Widowed    Spouse name: Not on file  . Number of children: 0  . Years of education: 47  . Highest education  level: 11th grade  Occupational History  . Occupation: disabled  Social Needs  . Financial resource strain: Not hard at all  . Food insecurity:    Worry: Never true    Inability: Never true  . Transportation needs:    Medical: No    Non-medical: No  Tobacco Use  . Smoking status: Current Every Day Smoker    Packs/day: 0.50    Types: Cigarettes  . Smokeless tobacco: Never Used  . Tobacco comment: 1/2-1 ppd  Substance and Sexual Activity  . Alcohol use: Yes    Alcohol/week: 2.0 standard drinks    Types: 2 Shots of liquor per week  . Drug use: No  . Sexual activity: Yes    Comment: 1 partner  Lifestyle  . Physical activity:    Days per week: 0 days    Minutes per session: Not on file  . Stress: To some extent  Relationships  . Social connections:    Talks on phone: Once a week    Gets together: Once a week    Attends religious service: Never    Active member of club or organization: No    Attends meetings of clubs or organizations: Never    Relationship status: Living with partner  . Intimate partner violence:    Fear of current or ex partner: No    Emotionally abused: No    Physically abused: No    Forced sexual activity: No  Other Topics Concern  . Not on file  Social History Narrative  . Not on file    Review of Systems: See HPI, otherwise negative  ROS  Physical Exam: BP 102/73   Pulse 74   Temp (!) 96.9 F (36.1 C) (Tympanic)   Resp 20   Ht 5\' 4"  (1.626 m)   Wt 76.2 kg   SpO2 92%   BMI 28.84 kg/m  General:   Alert,  pleasant and cooperative in NAD Head:  Normocephalic and atraumatic. Neck:  Supple; no masses or thyromegaly. Lungs:  Clear throughout to auscultation, normal respiratory effort.    Heart:  +S1, +S2, Regular rate and rhythm, No edema. Abdomen:  Soft, nontender and nondistended. Normal bowel sounds, without guarding, and without rebound.   Neurologic:  Alert and  oriented x4;  grossly normal neurologically.  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  Questions have been answered.  All parties agreeable.   Jonathon Bellows, MD  11/21/2018, 11:19 AM

## 2018-11-22 NOTE — Anesthesia Postprocedure Evaluation (Signed)
Anesthesia Post Note  [name]  Procedure(s) Performed: ENTEROSCOPY-BALLOON (N/A )  Patient location during evaluation: Endoscopy Anesthesia Type: General Level of consciousness: awake and alert and oriented Pain management: pain level controlled Vital Signs Assessment: post-procedure vital signs reviewed and stable Respiratory status: spontaneous breathing, nonlabored ventilation and respiratory function stable Cardiovascular status: blood pressure returned to baseline and stable Postop Assessment: no signs of nausea or vomiting Anesthetic complications: no     Last Vitals:  Vitals:   11/21/18 1219 11/21/18 1229  BP: 114/70 116/86  Pulse:    Resp:    Temp:    SpO2:      Last Pain:  Vitals:   11/21/18 1229  TempSrc:   PainSc: 0-No pain                 Levina Boyack

## 2018-11-26 ENCOUNTER — Other Ambulatory Visit: Payer: Self-pay | Admitting: Internal Medicine

## 2018-12-04 DIAGNOSIS — I5022 Chronic systolic (congestive) heart failure: Secondary | ICD-10-CM | POA: Diagnosis not present

## 2018-12-16 ENCOUNTER — Ambulatory Visit: Payer: Medicare Other | Admitting: Family

## 2018-12-20 ENCOUNTER — Ambulatory Visit: Payer: Medicare Other | Attending: Family | Admitting: Family

## 2018-12-20 ENCOUNTER — Encounter: Payer: Self-pay | Admitting: Family

## 2018-12-20 VITALS — BP 135/85 | HR 76 | Resp 18 | Ht 63.0 in | Wt 171.5 lb

## 2018-12-20 DIAGNOSIS — I10 Essential (primary) hypertension: Secondary | ICD-10-CM

## 2018-12-20 DIAGNOSIS — Z7982 Long term (current) use of aspirin: Secondary | ICD-10-CM | POA: Insufficient documentation

## 2018-12-20 DIAGNOSIS — I5022 Chronic systolic (congestive) heart failure: Secondary | ICD-10-CM | POA: Diagnosis not present

## 2018-12-20 DIAGNOSIS — I739 Peripheral vascular disease, unspecified: Secondary | ICD-10-CM | POA: Diagnosis not present

## 2018-12-20 DIAGNOSIS — I252 Old myocardial infarction: Secondary | ICD-10-CM | POA: Diagnosis not present

## 2018-12-20 DIAGNOSIS — Z95810 Presence of automatic (implantable) cardiac defibrillator: Secondary | ICD-10-CM | POA: Diagnosis not present

## 2018-12-20 DIAGNOSIS — Z7902 Long term (current) use of antithrombotics/antiplatelets: Secondary | ICD-10-CM | POA: Insufficient documentation

## 2018-12-20 DIAGNOSIS — E785 Hyperlipidemia, unspecified: Secondary | ICD-10-CM | POA: Insufficient documentation

## 2018-12-20 DIAGNOSIS — K219 Gastro-esophageal reflux disease without esophagitis: Secondary | ICD-10-CM | POA: Insufficient documentation

## 2018-12-20 DIAGNOSIS — Z8249 Family history of ischemic heart disease and other diseases of the circulatory system: Secondary | ICD-10-CM | POA: Diagnosis not present

## 2018-12-20 DIAGNOSIS — Z833 Family history of diabetes mellitus: Secondary | ICD-10-CM | POA: Insufficient documentation

## 2018-12-20 DIAGNOSIS — Z79899 Other long term (current) drug therapy: Secondary | ICD-10-CM | POA: Insufficient documentation

## 2018-12-20 DIAGNOSIS — Z8059 Family history of malignant neoplasm of other urinary tract organ: Secondary | ICD-10-CM | POA: Diagnosis not present

## 2018-12-20 DIAGNOSIS — I2510 Atherosclerotic heart disease of native coronary artery without angina pectoris: Secondary | ICD-10-CM | POA: Diagnosis not present

## 2018-12-20 DIAGNOSIS — F17210 Nicotine dependence, cigarettes, uncomplicated: Secondary | ICD-10-CM | POA: Diagnosis not present

## 2018-12-20 DIAGNOSIS — Z7952 Long term (current) use of systemic steroids: Secondary | ICD-10-CM | POA: Diagnosis not present

## 2018-12-20 DIAGNOSIS — Z85038 Personal history of other malignant neoplasm of large intestine: Secondary | ICD-10-CM | POA: Diagnosis not present

## 2018-12-20 DIAGNOSIS — I130 Hypertensive heart and chronic kidney disease with heart failure and stage 1 through stage 4 chronic kidney disease, or unspecified chronic kidney disease: Secondary | ICD-10-CM | POA: Insufficient documentation

## 2018-12-20 DIAGNOSIS — F329 Major depressive disorder, single episode, unspecified: Secondary | ICD-10-CM | POA: Diagnosis not present

## 2018-12-20 DIAGNOSIS — Z885 Allergy status to narcotic agent status: Secondary | ICD-10-CM | POA: Diagnosis not present

## 2018-12-20 DIAGNOSIS — N189 Chronic kidney disease, unspecified: Secondary | ICD-10-CM | POA: Insufficient documentation

## 2018-12-20 DIAGNOSIS — Z8041 Family history of malignant neoplasm of ovary: Secondary | ICD-10-CM | POA: Diagnosis not present

## 2018-12-20 DIAGNOSIS — J449 Chronic obstructive pulmonary disease, unspecified: Secondary | ICD-10-CM | POA: Diagnosis not present

## 2018-12-20 DIAGNOSIS — Z720 Tobacco use: Secondary | ICD-10-CM

## 2018-12-20 DIAGNOSIS — Z951 Presence of aortocoronary bypass graft: Secondary | ICD-10-CM | POA: Diagnosis not present

## 2018-12-20 LAB — BASIC METABOLIC PANEL
Anion gap: 9 (ref 5–15)
BUN: 17 mg/dL (ref 8–23)
CO2: 28 mmol/L (ref 22–32)
CREATININE: 1.08 mg/dL — AB (ref 0.44–1.00)
Calcium: 8.8 mg/dL — ABNORMAL LOW (ref 8.9–10.3)
Chloride: 103 mmol/L (ref 98–111)
GFR calc Af Amer: >60 mL/min (ref >60)
GFR calc non Af Amer: 54 mL/min — ABNORMAL LOW (ref >60)
Glucose, Bld: 118 mg/dL — ABNORMAL HIGH (ref 70–99)
Potassium: 3.7 mmol/L (ref 3.5–5.1)
Sodium: 140 mmol/L (ref 135–145)

## 2018-12-20 NOTE — Patient Instructions (Signed)
Continue weighing daily and call for an overnight weight gain of > 2 pounds or a weekly weight gain of >5 pounds. 

## 2018-12-20 NOTE — Progress Notes (Signed)
anne  Patient ID: Ashley [name], female    DOB: 28-Mar-1951, 68 y.o.   MRN: [id number]  HPI  Ashley [name] is a 68 y/o female with a history of CAD (CABG), hyperlipidemia, HTN, CKD, GERD, depression, COPD, PVD, current tobacco use and chronic heart failure.   Echo report from 08/22/18 reviewed and showed an EF of 30% along with moderate MR. Echo report from 06/21/16 reviewed and showed an EF of 55-60% along with mild AR and moderate MR.   Admitted 07/17/18 due to acute HF exacerbation. Cardiology consult obtained. Was initially given IV lasix and then transitioned to oral diuretics. Discharged after 2 days. Was in the ED 06/26/18 due to HF exacerbation. IV lasix given and she diuresed ~ 1 liter. Released the same day.   She presents today for a follow-up visit with a chief complaint of moderate fatigue upon minimal exertion. She describes this as chronic in nature having been present for several years. She has associated cough, shortness of breath, wheezing, light-headedness and diarrhea along with this. She denies any difficulty sleeping, abdominal distention, palpitations, pedal edema, chest pain or weight gain. Has had some diarrhea recently and was wondering if this could be related to her entresto use.   Past Medical History:  Diagnosis Date  . AICD (automatic cardioverter/defibrillator) present    on right side  . Anemia   . CAD (coronary artery disease)    s/p CABG  . CHF (congestive heart failure) ([place])   . Chronic kidney disease    renal artery stenosis  . Colon cancer ([place])   . COPD (chronic obstructive pulmonary disease) ([place])   . Depression   . Dyspnea   . GERD (gastroesophageal reflux disease)   . Hx of colonic polyps   . Hyperlipidemia   . Hypertension   . Mitral valve disorder    s/p mitral valve repair wth CABG  . Myocardial infarction ([place])   . Peripheral vascular disease ([place])   . Presence of permanent cardiac pacemaker    Pacemaker/ Defibrillator   Past Surgical  History:  Procedure Laterality Date  . arm surgery     fracture, has plates and screws  . CABG with mitral valve repair    . CARDIAC CATHETERIZATION    . COLON SURGERY     colon cancer  . COLONOSCOPY WITH PROPOFOL N/A 10/09/2016   Procedure: COLONOSCOPY WITH PROPOFOL;  Surgeon: Jonathon Bellows, MD;  Location: [facility] ENDOSCOPY;  Service: Endoscopy;  Laterality: N/A;  . COLONOSCOPY WITH PROPOFOL N/A 07/24/2018   Procedure: COLONOSCOPY WITH PROPOFOL;  Surgeon: Virgel Manifold, MD;  Location: [facility] ENDOSCOPY;  Service: Endoscopy;  Laterality: N/A;  . CORONARY ARTERY BYPASS GRAFT    . ENTEROSCOPY N/A 11/21/2018   Procedure: ENTEROSCOPY-BALLOON;  Surgeon: Jonathon Bellows, MD;  Location: [facility] ENDOSCOPY;  Service: Gastroenterology;  Laterality: N/A;  . ESOPHAGOGASTRODUODENOSCOPY (EGD) WITH PROPOFOL N/A 07/24/2018   Procedure: ESOPHAGOGASTRODUODENOSCOPY (EGD) WITH PROPOFOL;  Surgeon: Virgel Manifold, MD;  Location: [facility] ENDOSCOPY;  Service: Endoscopy;  Laterality: N/A;  . GIVENS CAPSULE STUDY N/A 08/16/2018   Procedure: GIVENS CAPSULE STUDY;  Surgeon: Virgel Manifold, MD;  Location: [facility] ENDOSCOPY;  Service: Endoscopy;  Laterality: N/A;  . pace maker defib  2016  . PERIPHERAL VASCULAR CATHETERIZATION N/A 10/23/2016   Procedure: Renal Angiography;  Surgeon: Algernon Huxley, MD;  Location: [place] [facility];  Service: Cardiovascular;  Laterality: N/A;  . TOTAL ABDOMINAL HYSTERECTOMY  1999   history of abnormal pap   Family History  Problem Relation Age of Onset  . Arthritis Mother   . Cancer Mother        uterus cancer  . Hyperlipidemia Mother   . Hypertension Mother   . Heart disease Mother   . Diabetes Mother   . Hyperlipidemia Father   . Hypertension Father   . Heart disease Father   . Diabetes Father   . Cancer Sister        ovary cancer  . Diabetes Maternal Grandmother   . Hypertension Maternal Grandmother   . Arthritis Maternal Grandmother   . Hypertension Maternal Grandfather    . Hypertension Paternal Grandmother   . Hypertension Paternal Grandfather   . Heart disease Paternal Grandfather   . Breast cancer Neg Hx    Social History   Tobacco Use  . Smoking status: Current Every Day Smoker    Packs/day: 0.50    Types: Cigarettes  . Smokeless tobacco: Never Used  . Tobacco comment: 1/2-1 ppd  Substance Use Topics  . Alcohol use: Yes    Alcohol/week: 2.0 standard drinks    Types: 2 Shots of liquor per week   Allergies  Allergen Reactions  . Hydrocodone-Acetaminophen Nausea Only   Prior to Admission medications   Medication Sig Start Date End Date Taking? Authorizing Provider  acetaminophen (TYLENOL) 500 MG tablet Maximum 6 tablets a day. Patient taking differently: Take 500 mg by mouth every 4 (four) hours as needed for fever. Maximum 6 tablets a day. 02/09/15  Yes Doss, Velora Heckler, RN  ADVAIR DISKUS 250-50 MCG/DOSE AEPB INHALE 1 PUFF BY MOUTH INTO THE LUNGS EVERY 12 HOURS RINSE MOUTH AFTER EACH USE 11/26/18  Yes [name], MD  ALPRAZolam (XANAX) 0.5 MG tablet TAKE ONE TABLET BY MOUTH AT BEDTIME AS NEEDED FOR ANXIETY Patient taking differently: Take 0.25-0.5 mg by mouth at bedtime. TAKE ONE TABLET BY MOUTH AT BEDTIME AS NEEDED FOR ANXIETY 05/08/18  Yes [name], MD  aspirin 81 MG tablet Take 81 mg by mouth daily.   Yes [provider]  atorvastatin (LIPITOR) 40 MG tablet TAKE ONE TABLET BY MOUTH EVERY DAY 11/04/18  Yes [name], MD  carvedilol (COREG) 6.25 MG tablet TAKE ONE TABLET BY MOUTH TWICE DAILY WITH MEAL 11/26/18  Yes [name], MD  clopidogrel (PLAVIX) 75 MG tablet TAKE ONE TABLET BY MOUTH EVERY DAY 11/26/18  Yes [name], MD  cyanocobalamin (,VITAMIN B-12,) 1000 MCG/ML injection Inject 1 mL weekly into the muscle monthly 11/08/18  Yes [name], MD  diphenhydrAMINE (DIPHENHIST) 25 mg capsule Take 25 mg by mouth at bedtime.   Yes [provider]  doxycycline (VIBRA-TABS) 100 MG tablet Take 1 tablet (100  mg total) by mouth 2 (two) times daily. 11/08/18  Yes [name], MD  escitalopram (LEXAPRO) 10 MG tablet TAKE ONE TABLET BY MOUTH EVERY DAY 11/26/18  Yes [name], MD  furosemide (LASIX) 40 MG tablet Take 1 tablet (40 mg total) by mouth daily. 2 for weight gain > 2 lbs overnight 11/08/18  Yes [name], MD  ipratropium-albuterol (DUONEB) 0.5-2.5 (3) MG/3ML SOLN Take 3 mLs by nebulization every 6 (six) hours as needed. 08/05/18  Yes [name], MD  nitroGLYCERIN (NITROSTAT) 0.4 MG SL tablet Place 1 tablet (0.4 mg total) under the tongue every 5 (five) minutes as needed for chest pain. 06/19/16  Yes [name], MD  omeprazole (PRILOSEC) 40 MG capsule TAKE ONE CAPSULE BY MOUTH EVERY DAY  11/26/18  Yes [name], MD  potassium chloride (K-DUR) 10 MEQ tablet Take 1 tablet (10 mEq total) by mouth daily. 11/08/18  Yes [name], MD  predniSONE (DELTASONE) 10 MG tablet 6 tablets on Day 1 , then reduce by 1 tablet daily until gone 11/08/18  Yes [name], [name], MD  sacubitril-valsartan (ENTRESTO) 24-26 MG Take 1 tablet by mouth 2 (two) times daily. 11/04/18  Yes Zuha Dejonge A, FNP  SPIRIVA RESPIMAT 2.5 MCG/ACT AERS ACTIVATE AND INHALE TWO SPRAYS ORALLY INTO THE LUNGS ONCE DAILY. 01/09/18  Yes [name], MD  Syringe/Needle, Disp, (SYRINGE 3CC/25GX1") 25G X 1" 3 ML MISC Use for b12 injections 07/29/18  Yes [name], MD  VENTOLIN HFA 108 (90 Base) MCG/ACT inhaler INHALE 2 PUFFS BY MOUTH INTO THE LUNGS EVERY 6 HOURS AS NEEDED FOR WHEEZING 09/09/18  Yes [name], MD    Review of Systems  Constitutional: Positive for fatigue (easily). Negative for appetite change.  HENT: Negative for congestion, postnasal drip and sore throat.   Eyes: Negative.   Respiratory: Positive for cough (productive of clear mucus), shortness of breath and wheezing (at times). Negative for chest tightness.   Cardiovascular: Negative for chest pain, palpitations and leg swelling.   Gastrointestinal: Positive for diarrhea. Negative for abdominal distention and abdominal pain.  Endocrine: Negative.   Genitourinary: Negative.   Musculoskeletal: Negative for back pain and neck pain.  Skin: Negative.   Allergic/Immunologic: Negative.   Neurological: Positive for light-headedness (when changing positions too quickly). Negative for dizziness.  Hematological: Negative for adenopathy. Does not bruise/bleed easily.  Psychiatric/Behavioral: Negative for dysphoric mood and sleep disturbance (sleeping on 1 pillow). The patient is not nervous/anxious.    Vitals:   12/20/18 1159  BP: 135/85  Pulse: 76  Resp: 18  SpO2: 100%  Weight: 171 lb 8 oz (77.8 kg)  Height: 5\' 3"  (1.6 m)   Wt Readings from Last 3 Encounters:  12/20/18 171 lb 8 oz (77.8 kg)  11/21/18 168 lb (76.2 kg)  11/08/18 168 lb 12.8 oz (76.6 kg)   Lab Results  Component Value Date   CREATININE 1.29 (H) 11/08/2018   CREATININE 1.38 (H) 07/29/2018   CREATININE 1.28 (H) 07/18/2018    Physical Exam Vitals signs and nursing note reviewed.  Constitutional:      Appearance: She is well-developed.  HENT:     Head: Normocephalic and atraumatic.  Neck:     Musculoskeletal: Normal range of motion and neck supple.     Vascular: No JVD.  Cardiovascular:     Rate and Rhythm: Normal rate and regular rhythm.  Pulmonary:     Effort: Pulmonary effort is normal. No respiratory distress.     Breath sounds: No wheezing or rales.  Abdominal:     General: There is no distension.     Palpations: Abdomen is soft.  Musculoskeletal:     Right lower leg: She exhibits no tenderness. No edema.     Left lower leg: She exhibits no tenderness. No edema.  Skin:    General: Skin is warm and dry.  Neurological:     Mental Status: She is alert and oriented to person, place, and time.  Psychiatric:        Behavior: Behavior normal.    Assessment & Plan:  1: Chronic heart failure with reduced ejection fraction- - NYHA class  III - euvolemic today - weighing daily and she was reminded to call for an overnight weight gain  of >2 pounds or a weekly weight gain of >5 pounds - weight up ~ 3 pounds from last visit here 6 weeks ago - not adding salt and has been reading food labels. Reviewed the importance of closely following a 2000mg  sodium diet  - entresto has been started since she was last here and she's had diarrhea since although she really doesn't know if the diarrhea started before or after the initiation of entresto - will get BMP today - encouraged her to speak with PCP regarding her diarrhea; if any GI cause is ruled out, we could hold entresto and see if diarrhea improves - otherwise, consider titrating up entreso and/or carvedilol - saw cardiology ([facility]) 11/11/2018 - BNP 07/17/18 was 1730.0 - she has received her flu vaccine for this season  2: HTN- - BP looks good today - saw PCP Derrel Nip) 11/08/2018 - BMP done 11/08/2018 reviewed and showed sodium 140, potassium 4.0, creatinine 1.29 and GFR 44.01  3: Tobacco use- - smoking 7 cigarettes daily - currently not interested in stopping smoking - complete cessation discussed for 3 minutes with her.  Patient did not bring her medications nor a list. Each medication was verbally reviewed with the patient and she was encouraged to bring the bottles to every visit to confirm accuracy of list.  Return in 4 months or sooner for any questions/problems before then.

## 2018-12-21 ENCOUNTER — Encounter: Payer: Self-pay | Admitting: Family

## 2019-02-05 ENCOUNTER — Other Ambulatory Visit: Payer: Self-pay | Admitting: Internal Medicine

## 2019-02-24 ENCOUNTER — Other Ambulatory Visit: Payer: Self-pay | Admitting: Internal Medicine

## 2019-02-24 NOTE — Telephone Encounter (Signed)
Refilled: 05/08/2018 Last OV: 11/08/2018 Next OV: 05/07/2019

## 2019-03-16 ENCOUNTER — Emergency Department: Payer: Medicare Other

## 2019-03-16 ENCOUNTER — Other Ambulatory Visit: Payer: Self-pay

## 2019-03-16 ENCOUNTER — Inpatient Hospital Stay
Admission: EM | Admit: 2019-03-16 | Discharge: 2019-03-17 | DRG: 190 | Disposition: A | Discharge status: Home / Self Care | Payer: Medicare Other | Attending: Internal Medicine | Admitting: Internal Medicine

## 2019-03-16 DIAGNOSIS — I5032 Chronic diastolic (congestive) heart failure: Secondary | ICD-10-CM

## 2019-03-16 DIAGNOSIS — J9601 Acute respiratory failure with hypoxia: Secondary | ICD-10-CM | POA: Diagnosis present

## 2019-03-16 DIAGNOSIS — E1165 Type 2 diabetes mellitus with hyperglycemia: Secondary | ICD-10-CM | POA: Diagnosis not present

## 2019-03-16 DIAGNOSIS — I509 Heart failure, unspecified: Secondary | ICD-10-CM

## 2019-03-16 DIAGNOSIS — Z951 Presence of aortocoronary bypass graft: Secondary | ICD-10-CM

## 2019-03-16 DIAGNOSIS — I252 Old myocardial infarction: Secondary | ICD-10-CM

## 2019-03-16 DIAGNOSIS — J441 Chronic obstructive pulmonary disease with (acute) exacerbation: Principal | ICD-10-CM | POA: Diagnosis present

## 2019-03-16 DIAGNOSIS — E785 Hyperlipidemia, unspecified: Secondary | ICD-10-CM | POA: Diagnosis present

## 2019-03-16 DIAGNOSIS — Z7902 Long term (current) use of antithrombotics/antiplatelets: Secondary | ICD-10-CM | POA: Diagnosis not present

## 2019-03-16 DIAGNOSIS — R069 Unspecified abnormalities of breathing: Secondary | ICD-10-CM | POA: Diagnosis not present

## 2019-03-16 DIAGNOSIS — R739 Hyperglycemia, unspecified: Secondary | ICD-10-CM | POA: Diagnosis present

## 2019-03-16 DIAGNOSIS — Z85038 Personal history of other malignant neoplasm of large intestine: Secondary | ICD-10-CM | POA: Diagnosis not present

## 2019-03-16 DIAGNOSIS — I110 Hypertensive heart disease with heart failure: Secondary | ICD-10-CM | POA: Diagnosis present

## 2019-03-16 DIAGNOSIS — R0902 Hypoxemia: Secondary | ICD-10-CM | POA: Diagnosis not present

## 2019-03-16 DIAGNOSIS — Z7951 Long term (current) use of inhaled steroids: Secondary | ICD-10-CM

## 2019-03-16 DIAGNOSIS — I2510 Atherosclerotic heart disease of native coronary artery without angina pectoris: Secondary | ICD-10-CM | POA: Diagnosis present

## 2019-03-16 DIAGNOSIS — R231 Pallor: Secondary | ICD-10-CM | POA: Diagnosis not present

## 2019-03-16 DIAGNOSIS — I5033 Acute on chronic diastolic (congestive) heart failure: Secondary | ICD-10-CM | POA: Diagnosis present

## 2019-03-16 DIAGNOSIS — Z95810 Presence of automatic (implantable) cardiac defibrillator: Secondary | ICD-10-CM

## 2019-03-16 DIAGNOSIS — I10 Essential (primary) hypertension: Secondary | ICD-10-CM | POA: Diagnosis not present

## 2019-03-16 DIAGNOSIS — T380X5A Adverse effect of glucocorticoids and synthetic analogues, initial encounter: Secondary | ICD-10-CM | POA: Diagnosis present

## 2019-03-16 DIAGNOSIS — R0602 Shortness of breath: Secondary | ICD-10-CM | POA: Diagnosis not present

## 2019-03-16 DIAGNOSIS — F17210 Nicotine dependence, cigarettes, uncomplicated: Secondary | ICD-10-CM | POA: Diagnosis present

## 2019-03-16 DIAGNOSIS — R7301 Impaired fasting glucose: Secondary | ICD-10-CM | POA: Diagnosis present

## 2019-03-16 DIAGNOSIS — Z7982 Long term (current) use of aspirin: Secondary | ICD-10-CM | POA: Diagnosis not present

## 2019-03-16 DIAGNOSIS — I25700 Atherosclerosis of coronary artery bypass graft(s), unspecified, with unstable angina pectoris: Secondary | ICD-10-CM

## 2019-03-16 DIAGNOSIS — Z20828 Contact with and (suspected) exposure to other viral communicable diseases: Secondary | ICD-10-CM | POA: Diagnosis present

## 2019-03-16 DIAGNOSIS — J9690 Respiratory failure, unspecified, unspecified whether with hypoxia or hypercapnia: Secondary | ICD-10-CM | POA: Diagnosis present

## 2019-03-16 DIAGNOSIS — R008 Other abnormalities of heart beat: Secondary | ICD-10-CM | POA: Diagnosis not present

## 2019-03-16 DIAGNOSIS — R0689 Other abnormalities of breathing: Secondary | ICD-10-CM | POA: Diagnosis not present

## 2019-03-16 LAB — GLUCOSE, CAPILLARY
Glucose-Capillary: 263 mg/dL — ABNORMAL HIGH (ref 70–99)
Glucose-Capillary: 195 mg/dL — ABNORMAL HIGH (ref 70–99)

## 2019-03-16 LAB — PROCALCITONIN: Procalcitonin: <0.1 ng/mL

## 2019-03-16 LAB — HEMOGLOBIN A1C
Hgb A1c MFr Bld: 5.5 % (ref 4.8–5.6)
Mean Plasma Glucose: 111.15 mg/dL

## 2019-03-16 LAB — CBC WITH DIFFERENTIAL/PLATELET
Abs Immature Granulocytes: 0.05 10*3/uL (ref 0.00–0.07)
Basophils Absolute: 0.1 10*3/uL (ref 0.0–0.1)
Basophils Relative: 1 %
Eosinophils Absolute: 0.2 10*3/uL (ref 0.0–0.5)
Eosinophils Relative: 2 %
HCT: 39.3 % (ref 36.0–46.0)
Hemoglobin: 13.5 g/dL (ref 12.0–15.0)
Immature Granulocytes: 1 %
Lymphocytes Relative: 16 %
Lymphs Abs: 1.4 10*3/uL (ref 0.7–4.0)
MCH: 35.6 pg — ABNORMAL HIGH (ref 26.0–34.0)
MCHC: 34.4 g/dL (ref 30.0–36.0)
MCV: 103.7 fL — ABNORMAL HIGH (ref 80.0–100.0)
Monocytes Absolute: 0.4 10*3/uL (ref 0.1–1.0)
Monocytes Relative: 5 %
Neutro Abs: 6.2 10*3/uL (ref 1.7–7.7)
Neutrophils Relative %: 75 %
Platelets: 319 10*3/uL (ref 150–400)
RBC: 3.79 MIL/uL — ABNORMAL LOW (ref 3.87–5.11)
RDW: 14.1 % (ref 11.5–15.5)
WBC: 8.3 10*3/uL (ref 4.0–10.5)
nRBC: 0 % (ref 0.0–0.2)

## 2019-03-16 LAB — COMPREHENSIVE METABOLIC PANEL
ALT: 22 U/L (ref 0–44)
AST: 36 U/L (ref 15–41)
Albumin: 3.8 g/dL (ref 3.5–5.0)
Alkaline Phosphatase: 85 U/L (ref 38–126)
Anion gap: 15 (ref 5–15)
BUN: 13 mg/dL (ref 8–23)
CO2: 19 mmol/L — ABNORMAL LOW (ref 22–32)
Calcium: 8.4 mg/dL — ABNORMAL LOW (ref 8.9–10.3)
Chloride: 105 mmol/L (ref 98–111)
Creatinine, Ser: 1.11 mg/dL — ABNORMAL HIGH (ref 0.44–1.00)
GFR calc Af Amer: >60 mL/min (ref >60)
GFR calc non Af Amer: 52 mL/min — ABNORMAL LOW (ref >60)
Glucose, Bld: 352 mg/dL — ABNORMAL HIGH (ref 70–99)
Potassium: 3.9 mmol/L (ref 3.5–5.1)
Sodium: 139 mmol/L (ref 135–145)
Total Bilirubin: 1 mg/dL (ref 0.3–1.2)
Total Protein: 6.9 g/dL (ref 6.5–8.1)

## 2019-03-16 LAB — LACTIC ACID, PLASMA
Lactic Acid, Venous: 5.2 mmol/L (ref 0.5–1.9)
Lactic Acid, Venous: 3.1 mmol/L (ref 0.5–1.9)

## 2019-03-16 LAB — BLOOD GAS, VENOUS
Acid-base deficit: 3.6 mmol/L — ABNORMAL HIGH (ref 0.0–2.0)
Bicarbonate: 23.9 mmol/L (ref 20.0–28.0)
O2 Saturation: 51.5 %
Patient temperature: 37
pCO2, Ven: 52 mmHg (ref 44.0–60.0)
pH, Ven: 7.27 (ref 7.250–7.430)
pO2, Ven: 32 mmHg (ref 32.0–45.0)

## 2019-03-16 LAB — SARS CORONAVIRUS 2 BY RT PCR (HOSPITAL ORDER, PERFORMED IN ~~LOC~~ HOSPITAL LAB): SARS Coronavirus 2: NEGATIVE

## 2019-03-16 LAB — TROPONIN I: Troponin I: <0.03 ng/mL (ref <0.03)

## 2019-03-16 LAB — BRAIN NATRIURETIC PEPTIDE: B Natriuretic Peptide: 1287 pg/mL — ABNORMAL HIGH (ref 0.0–100.0)

## 2019-03-16 MED ORDER — INSULIN ASPART 100 UNIT/ML IV SOLN
10.0000 [IU] | Freq: Once | INTRAVENOUS | Status: DC
  Filled 2019-03-16: qty 0.1

## 2019-03-16 MED ORDER — NICOTINE 14 MG/24HR TD PT24
14.0000 mg | MEDICATED_PATCH | Freq: Every day | TRANSDERMAL | Status: DC
  Administered 2019-03-16 – 2019-03-17 (×2): 14 mg via TRANSDERMAL
  Filled 2019-03-16 (×2): qty 1

## 2019-03-16 MED ORDER — FUROSEMIDE 10 MG/ML IJ SOLN
40.0000 mg | Freq: Once | INTRAMUSCULAR | Status: AC
  Administered 2019-03-16: 40 mg via INTRAVENOUS
  Filled 2019-03-16: qty 4

## 2019-03-16 MED ORDER — DIPHENHYDRAMINE HCL 25 MG PO CAPS
25.0000 mg | ORAL_CAPSULE | Freq: Every day | ORAL | Status: DC
  Administered 2019-03-16: 25 mg via ORAL
  Filled 2019-03-16: qty 1

## 2019-03-16 MED ORDER — SODIUM CHLORIDE 0.9 % IV SOLN
500.0000 mg | Freq: Once | INTRAVENOUS | Status: AC
  Administered 2019-03-16: 500 mg via INTRAVENOUS
  Filled 2019-03-16: qty 500

## 2019-03-16 MED ORDER — METHYLPREDNISOLONE SODIUM SUCC 125 MG IJ SOLR
60.0000 mg | Freq: Four times a day (QID) | INTRAMUSCULAR | Status: DC
  Administered 2019-03-16 – 2019-03-17 (×4): 60 mg via INTRAVENOUS
  Filled 2019-03-16 (×4): qty 2

## 2019-03-16 MED ORDER — ONDANSETRON HCL 4 MG/2ML IJ SOLN: 4.0000 mg | Freq: Four times a day (QID) | INTRAMUSCULAR | Status: DC | PRN

## 2019-03-16 MED ORDER — VANCOMYCIN HCL IN DEXTROSE 1-5 GM/200ML-% IV SOLN
1000.0000 mg | Freq: Once | INTRAVENOUS | Status: DC
  Filled 2019-03-16: qty 200

## 2019-03-16 MED ORDER — CARVEDILOL 6.25 MG PO TABS
6.2500 mg | ORAL_TABLET | Freq: Two times a day (BID) | ORAL | Status: DC
  Administered 2019-03-16 – 2019-03-17 (×2): 6.25 mg via ORAL
  Filled 2019-03-16 (×2): qty 1

## 2019-03-16 MED ORDER — VANCOMYCIN HCL 10 G IV SOLR
2000.0000 mg | Freq: Once | INTRAVENOUS | Status: AC
  Administered 2019-03-16: 2000 mg via INTRAVENOUS
  Filled 2019-03-16: qty 2000

## 2019-03-16 MED ORDER — ASPIRIN EC 81 MG PO TBEC
81.0000 mg | DELAYED_RELEASE_TABLET | Freq: Every day | ORAL | Status: DC
  Administered 2019-03-16 – 2019-03-17 (×2): 81 mg via ORAL
  Filled 2019-03-16 (×2): qty 1

## 2019-03-16 MED ORDER — POTASSIUM CHLORIDE CRYS ER 20 MEQ PO TBCR
10.0000 meq | EXTENDED_RELEASE_TABLET | Freq: Every day | ORAL | Status: DC
  Administered 2019-03-16 – 2019-03-17 (×2): 10 meq via ORAL
  Filled 2019-03-16 (×2): qty 1

## 2019-03-16 MED ORDER — VANCOMYCIN HCL IN DEXTROSE 1-5 GM/200ML-% IV SOLN: 1000.0000 mg | Freq: Once | INTRAVENOUS | Status: DC

## 2019-03-16 MED ORDER — ACETAMINOPHEN 325 MG PO TABS: 650.0000 mg | ORAL_TABLET | ORAL | Status: DC | PRN

## 2019-03-16 MED ORDER — INSULIN ASPART 100 UNIT/ML ~~LOC~~ SOLN: 0.0000 [IU] | Freq: Every day | SUBCUTANEOUS | Status: DC

## 2019-03-16 MED ORDER — IPRATROPIUM-ALBUTEROL 0.5-2.5 (3) MG/3ML IN SOLN
3.0000 mL | Freq: Once | RESPIRATORY_TRACT | Status: AC
  Administered 2019-03-16: 10:00:00 3 mL via RESPIRATORY_TRACT
  Filled 2019-03-16: qty 3

## 2019-03-16 MED ORDER — ALPRAZOLAM 0.25 MG PO TABS
0.2500 mg | ORAL_TABLET | Freq: Every day | ORAL | Status: DC
  Administered 2019-03-16: 0.5 mg via ORAL
  Filled 2019-03-16: qty 2

## 2019-03-16 MED ORDER — NITROGLYCERIN 0.4 MG SL SUBL: 0.4000 mg | SUBLINGUAL_TABLET | SUBLINGUAL | Status: DC | PRN

## 2019-03-16 MED ORDER — SODIUM CHLORIDE 0.9% FLUSH: 3.0000 mL | INTRAVENOUS | Status: DC | PRN

## 2019-03-16 MED ORDER — MOMETASONE FURO-FORMOTEROL FUM 200-5 MCG/ACT IN AERO
2.0000 | INHALATION_SPRAY | Freq: Two times a day (BID) | RESPIRATORY_TRACT | Status: DC
  Administered 2019-03-16 – 2019-03-17 (×2): 2 via RESPIRATORY_TRACT
  Filled 2019-03-16: qty 8.8

## 2019-03-16 MED ORDER — GUAIFENESIN ER 600 MG PO TB12
600.0000 mg | ORAL_TABLET | Freq: Two times a day (BID) | ORAL | Status: DC
  Administered 2019-03-16 – 2019-03-17 (×3): 600 mg via ORAL
  Filled 2019-03-16 (×3): qty 1

## 2019-03-16 MED ORDER — PANTOPRAZOLE SODIUM 40 MG PO TBEC
40.0000 mg | DELAYED_RELEASE_TABLET | Freq: Every day | ORAL | Status: DC
  Administered 2019-03-16 – 2019-03-17 (×2): 40 mg via ORAL
  Filled 2019-03-16 (×2): qty 1

## 2019-03-16 MED ORDER — ENOXAPARIN SODIUM 40 MG/0.4ML ~~LOC~~ SOLN
40.0000 mg | SUBCUTANEOUS | Status: DC
  Administered 2019-03-16: 40 mg via SUBCUTANEOUS
  Filled 2019-03-16: qty 0.4

## 2019-03-16 MED ORDER — ESCITALOPRAM OXALATE 10 MG PO TABS
10.0000 mg | ORAL_TABLET | Freq: Every day | ORAL | Status: DC
  Administered 2019-03-16 – 2019-03-17 (×2): 10 mg via ORAL
  Filled 2019-03-16 (×2): qty 1

## 2019-03-16 MED ORDER — SODIUM CHLORIDE 0.9 % IV SOLN
100.0000 mg | Freq: Two times a day (BID) | INTRAVENOUS | Status: DC
  Administered 2019-03-16 – 2019-03-17 (×2): 100 mg via INTRAVENOUS
  Filled 2019-03-16 (×3): qty 100

## 2019-03-16 MED ORDER — SACUBITRIL-VALSARTAN 24-26 MG PO TABS
1.0000 | ORAL_TABLET | Freq: Two times a day (BID) | ORAL | Status: DC
  Administered 2019-03-16 – 2019-03-17 (×3): 1 via ORAL
  Filled 2019-03-16 (×3): qty 1

## 2019-03-16 MED ORDER — ATORVASTATIN CALCIUM 20 MG PO TABS
40.0000 mg | ORAL_TABLET | Freq: Every day | ORAL | Status: DC
  Administered 2019-03-16: 40 mg via ORAL
  Filled 2019-03-16: qty 2

## 2019-03-16 MED ORDER — FUROSEMIDE 10 MG/ML IJ SOLN
40.0000 mg | Freq: Two times a day (BID) | INTRAMUSCULAR | Status: DC
  Administered 2019-03-16 – 2019-03-17 (×2): 40 mg via INTRAVENOUS
  Filled 2019-03-16 (×2): qty 4

## 2019-03-16 MED ORDER — ALPRAZOLAM 0.25 MG PO TABS: 0.2500 mg | ORAL_TABLET | Freq: Three times a day (TID) | ORAL | Status: DC | PRN

## 2019-03-16 MED ORDER — SODIUM CHLORIDE 0.9 % IV SOLN: 250.0000 mL | INTRAVENOUS | Status: DC | PRN

## 2019-03-16 MED ORDER — SODIUM CHLORIDE 0.9 % IV SOLN
2.0000 g | Freq: Once | INTRAVENOUS | Status: AC
  Administered 2019-03-16: 2 g via INTRAVENOUS
  Filled 2019-03-16: qty 2

## 2019-03-16 MED ORDER — MORPHINE SULFATE (PF) 2 MG/ML IV SOLN: 2.0000 mg | INTRAVENOUS | Status: DC | PRN

## 2019-03-16 MED ORDER — IPRATROPIUM-ALBUTEROL 0.5-2.5 (3) MG/3ML IN SOLN
3.0000 mL | Freq: Once | RESPIRATORY_TRACT | Status: AC
  Administered 2019-03-16: 3 mL via RESPIRATORY_TRACT
  Filled 2019-03-16: qty 6

## 2019-03-16 MED ORDER — HYDRALAZINE HCL 20 MG/ML IJ SOLN: 10.0000 mg | INTRAMUSCULAR | Status: DC | PRN

## 2019-03-16 MED ORDER — SODIUM BICARBONATE 8.4 % IV SOLN
100.0000 meq | Freq: Once | INTRAVENOUS | Status: AC
  Administered 2019-03-16: 16:00:00 100 meq via INTRAVENOUS
  Filled 2019-03-16: qty 100

## 2019-03-16 MED ORDER — SODIUM CHLORIDE 0.9 % IV SOLN: 2.0000 g | Freq: Once | INTRAVENOUS | Status: DC

## 2019-03-16 MED ORDER — INSULIN GLARGINE 100 UNIT/ML ~~LOC~~ SOLN: 25.0000 [IU] | Freq: Every day | SUBCUTANEOUS | Status: DC

## 2019-03-16 MED ORDER — INSULIN ASPART 100 UNIT/ML ~~LOC~~ SOLN
0.0000 [IU] | Freq: Three times a day (TID) | SUBCUTANEOUS | Status: DC
  Administered 2019-03-16 – 2019-03-17 (×2): 11 [IU] via SUBCUTANEOUS
  Administered 2019-03-17: 4 [IU] via SUBCUTANEOUS
  Filled 2019-03-16 (×3): qty 1

## 2019-03-16 MED ORDER — SODIUM CHLORIDE 0.9% FLUSH
3.0000 mL | Freq: Two times a day (BID) | INTRAVENOUS | Status: DC
  Administered 2019-03-16 – 2019-03-17 (×3): 3 mL via INTRAVENOUS

## 2019-03-16 MED ORDER — CLOPIDOGREL BISULFATE 75 MG PO TABS
75.0000 mg | ORAL_TABLET | Freq: Every day | ORAL | Status: DC
  Administered 2019-03-16 – 2019-03-17 (×2): 75 mg via ORAL
  Filled 2019-03-16 (×2): qty 1

## 2019-03-16 MED ORDER — IPRATROPIUM-ALBUTEROL 0.5-2.5 (3) MG/3ML IN SOLN
3.0000 mL | Freq: Four times a day (QID) | RESPIRATORY_TRACT | Status: DC
  Administered 2019-03-16 – 2019-03-17 (×4): 3 mL via RESPIRATORY_TRACT
  Filled 2019-03-16 (×4): qty 3

## 2019-03-16 MED ORDER — LIDOCAINE HCL URETHRAL/MUCOSAL 2 % EX GEL: 1.0000 "application " | Freq: Once | CUTANEOUS | Status: DC

## 2019-03-16 MED ORDER — METHYLPREDNISOLONE SODIUM SUCC 125 MG IJ SOLR
125.0000 mg | Freq: Once | INTRAMUSCULAR | Status: AC
  Administered 2019-03-16: 11:00:00 125 mg via INTRAVENOUS
  Filled 2019-03-16: qty 2

## 2019-03-16 MED ORDER — ACETAMINOPHEN 500 MG PO TABS: 500.0000 mg | ORAL_TABLET | ORAL | Status: DC | PRN

## 2019-03-16 NOTE — ED Provider Notes (Signed)
[facility] Emergency Department Provider Note    None    (approximate)  I have reviewed the triage vital signs and the nursing notes.   HISTORY  Chief Complaint Shortness of Breath    HPI Ashley [name] is a 68 y.o. female extensive past medical history as listed below arrives to the ER in respiratory distress.  Called out EMS due to shortness of breath that worsened overnight.  Was found by EMS tripoding hypoxic to the low 80s on room air.  Was placed on CPAP with improvement in symptoms.  Does have a history of congestive heart failure as well as COPD.  Has been feeling wheezing but also endorsing worsening orthopnea.  No measured fevers.  Has had productive cough but no sick contacts.  No recent admissions or antibiotics.    Past Medical History:  Diagnosis Date   AICD (automatic cardioverter/defibrillator) present    on right side   Anemia    CAD (coronary artery disease)    s/p CABG   CHF (congestive heart failure) (HCC)    Chronic kidney disease    renal artery stenosis   Colon cancer (HCC)    COPD (chronic obstructive pulmonary disease) (HCC)    Depression    Dyspnea    GERD (gastroesophageal reflux disease)    Hx of colonic polyps    Hyperlipidemia    Hypertension    Mitral valve disorder    s/p mitral valve repair wth CABG   Myocardial infarction ([place])    Peripheral vascular disease ([place])    Presence of permanent cardiac pacemaker    Pacemaker/ Defibrillator   Family History  Problem Relation Age of Onset   Arthritis Mother    Cancer Mother        uterus cancer   Hyperlipidemia Mother    Hypertension Mother    Heart disease Mother    Diabetes Mother    Hyperlipidemia Father    Hypertension Father    Heart disease Father    Diabetes Father    Cancer Sister        ovary cancer   Diabetes Maternal Grandmother    Hypertension Maternal Grandmother    Arthritis Maternal Grandmother     Hypertension Maternal Grandfather    Hypertension Paternal Grandmother    Hypertension Paternal Grandfather    Heart disease Paternal Grandfather    Breast cancer Neg Hx    Past Surgical History:  Procedure Laterality Date   arm surgery     fracture, has plates and screws   CABG with mitral valve repair     CARDIAC CATHETERIZATION     COLON SURGERY     colon cancer   COLONOSCOPY WITH PROPOFOL N/A 10/09/2016   Procedure: COLONOSCOPY WITH PROPOFOL;  Surgeon: Jonathon Bellows, MD;  Location: [facility] ENDOSCOPY;  Service: Endoscopy;  Laterality: N/A;   COLONOSCOPY WITH PROPOFOL N/A 07/24/2018   Procedure: COLONOSCOPY WITH PROPOFOL;  Surgeon: Virgel Manifold, MD;  Location: [facility] ENDOSCOPY;  Service: Endoscopy;  Laterality: N/A;   CORONARY ARTERY BYPASS GRAFT     ENTEROSCOPY N/A 11/21/2018   Procedure: ENTEROSCOPY-BALLOON;  Surgeon: Jonathon Bellows, MD;  Location: [facility] ENDOSCOPY;  Service: Gastroenterology;  Laterality: N/A;   ESOPHAGOGASTRODUODENOSCOPY (EGD) WITH PROPOFOL N/A 07/24/2018   Procedure: ESOPHAGOGASTRODUODENOSCOPY (EGD) WITH PROPOFOL;  Surgeon: Virgel Manifold, MD;  Location: [facility] ENDOSCOPY;  Service: Endoscopy;  Laterality: N/A;   GIVENS CAPSULE STUDY N/A 08/16/2018   Procedure: GIVENS CAPSULE STUDY;  Surgeon: Virgel Manifold, MD;  Location: [facility] ENDOSCOPY;  Service: Endoscopy;  Laterality: N/A;   pace maker defib  2016   PERIPHERAL VASCULAR CATHETERIZATION N/A 10/23/2016   Procedure: Renal Angiography;  Surgeon: Algernon Huxley, MD;  Location: [place] [facility];  Service: Cardiovascular;  Laterality: N/A;   TOTAL ABDOMINAL HYSTERECTOMY  1999   history of abnormal pap   Patient Active Problem List   Diagnosis Date Noted   AVM (arteriovenous malformation) of small bowel, acquired    Anemia, iron deficiency 95/07/3266   Chronic systolic heart failure ([place]) 11/06/2018   Rectal polyp    Benign neoplasm of cecum    Barrett's esophagus without dysplasia     Stomach irritation    Chronic diastolic heart failure ([place]) 07/03/2018   HTN (hypertension) 07/03/2018   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   B12 deficiency anemia 06/28/2018   Hypotension 05/11/2018   Prediabetes 05/11/2018   Personal history of colon cancer    Benign neoplasm of descending colon    Polyp of sigmoid colon    Benign neoplasm of transverse colon    Diverticulosis of large intestine without diverticulitis    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   Renal artery stenosis (HCC) 10/03/2016   Failure of implantable cardioverter-defibrillator (ICD) lead 02/09/2015   Hospital discharge follow-up 02/09/2015   Cough in adult 10/21/2014   Tobacco abuse 10/11/2014   Tobacco abuse counseling 10/11/2014   Chronic right hip pain 08/26/2014   Atherosclerosis of native artery of extremity with intermittent claudication ([place]) 06/18/2013   Preoperative evaluation to rule out surgical contraindication 06/18/2013   CAD (coronary artery disease) 06/01/2013   GERD (gastroesophageal reflux disease) 06/01/2013   Hypercholesterolemia 06/01/2013   Tubular adenoma of colon 06/01/2013   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 Authorizing Provider  acetaminophen (TYLENOL) 500 MG tablet Maximum 6 tablets a day. Patient taking differently: Take 500 mg by mouth every 4 (four) hours as needed for fever. Maximum 6 tablets a day. 02/09/15   Rubbie Battiest, RN  ADVAIR DISKUS 250-50 MCG/DOSE AEPB INHALE 1 PUFF BY MOUTH INTO THE LUNGS EVERY 12 HOURS RINSE MOUTH AFTER EACH USE 11/26/18   [name], MD  ALPRAZolam Duanne Moron) 0.5 MG tablet Take 0.5-1 tablets (0.25-0.5 mg total) by mouth at bedtime. TAKE ONE TABLET BY MOUTH AT BEDTIME AS NEEDED FOR ANXIETY 02/24/19   [name], MD  aspirin 81 MG tablet Take 81 mg by mouth daily.    [provider]  atorvastatin  (LIPITOR) 40 MG tablet TAKE ONE TABLET BY MOUTH EVERY DAY 11/04/18   [name], MD  carvedilol (COREG) 6.25 MG tablet TAKE ONE TABLET BY MOUTH TWICE DAILY WITH MEAL 11/26/18   [name], MD  clopidogrel (PLAVIX) 75 MG tablet TAKE ONE TABLET BY MOUTH EVERY DAY 11/26/18   [name], MD  cyanocobalamin (,VITAMIN B-12,) 1000 MCG/ML injection Inject 1 mL weekly into the muscle monthly 11/08/18   [name], MD  diphenhydrAMINE (DIPHENHIST) 25 mg capsule Take 25 mg by mouth at bedtime.    [provider]  doxycycline (VIBRA-TABS) 100 MG tablet Take 1 tablet (100 mg total) by mouth 2 (two) times daily. 11/08/18   [name], MD  escitalopram (LEXAPRO) 10 MG tablet TAKE ONE TABLET BY MOUTH EVERY DAY 11/26/18   [name], MD  furosemide (LASIX) 40 MG tablet Take 1 tablet (40 mg  total) by mouth daily. 2 for weight gain > 2 lbs overnight 11/08/18   [name], MD  ipratropium-albuterol (DUONEB) 0.5-2.5 (3) MG/3ML SOLN Take 3 mLs by nebulization every 6 (six) hours as needed. 08/05/18   [name], MD  nitroGLYCERIN (NITROSTAT) 0.4 MG SL tablet Place 1 tablet (0.4 mg total) under the tongue every 5 (five) minutes as needed for chest pain. 06/19/16   [name], MD  omeprazole (PRILOSEC) 40 MG capsule TAKE ONE CAPSULE BY MOUTH EVERY DAY 11/26/18   [name], MD  potassium chloride (K-DUR) 10 MEQ tablet Take 1 tablet (10 mEq total) by mouth daily. 11/08/18   [name], MD  predniSONE (DELTASONE) 10 MG tablet 6 tablets on Day 1 , then reduce by 1 tablet daily until gone 11/08/18   [name], MD  sacubitril-valsartan (ENTRESTO) 24-26 MG Take 1 tablet by mouth 2 (two) times daily. 11/04/18   Darylene Price A, FNP  SPIRIVA RESPIMAT 2.5 MCG/ACT AERS ACTIVATE AND INHALE TWO SPRAYS ORALLY INTO THE LUNGS ONCE DAILY. 01/09/18   [name], MD  Syringe/Needle, Disp, (SYRINGE 3CC/25GX1") 25G X 1" 3 ML MISC Use for b12 injections 07/29/18   [name], MD    VENTOLIN HFA 108 (90 Base) MCG/ACT inhaler INHALE 2 PUFFS BY MOUTH INTO THE LUNGS EVERY 6 HOURS AS NEEDED FOR WHEEZING 09/09/18   [name], MD    Allergies Hydrocodone-acetaminophen    Social History Social History   Tobacco Use   Smoking status: Current Every Day Smoker    Packs/day: 0.50    Types: Cigarettes   Smokeless tobacco: Never Used   Tobacco comment: 1/2-1 ppd  Substance Use Topics   Alcohol use: Yes    Alcohol/week: 2.0 standard drinks    Types: 2 Shots of liquor per week   Drug use: No    Review of Systems Patient denies headaches, rhinorrhea, blurry vision, numbness, shortness of breath, chest pain, edema, cough, abdominal pain, nausea, vomiting, diarrhea, dysuria, fevers, rashes or hallucinations unless otherwise stated above in HPI. ____________________________________________   PHYSICAL EXAM:  VITAL SIGNS: Vitals:   03/16/19 1045 03/16/19 1100  BP:  113/73  Pulse: 80 81  Resp: 18 (!) 36  Temp:    SpO2: 100% 100%    Constitutional: Alert and oriented. Arrives in respiratory distress Eyes: Conjunctivae are normal.  Head: Atraumatic. Nose: No congestion/rhinnorhea. Mouth/Throat: Mucous membranes are moist.   Neck: No stridor. Painless ROM.  Cardiovascular: Normal rate, regular rhythm. Grossly normal heart sounds.  Good peripheral circulation. Respiratory: tachypnea with diminiisehd bs throughout,  On cpap. Gastrointestinal: Soft and nontender. No distention. No abdominal bruits. No CVA tenderness. Genitourinary:  Musculoskeletal: No lower extremity tenderness nor edema.  No joint effusions. Neurologic:  Normal speech and language. No gross focal neurologic deficits are appreciated. No facial droop Skin:  Skin is warm, dry and intact. No rash noted. Psychiatric: Mood and affect are normal. Speech and behavior are normal.  ____________________________________________   LABS (all labs ordered are listed, but only abnormal results are  displayed)  Results for orders placed or performed during the hospital encounter of 03/16/19 (from the past 24 hour(s))  SARS Coronavirus 2 (CEPHEID- Performed in [place] hospital lab), Hosp Order     Status: None   Collection Time: 03/16/19 10:06 AM  Result Value Ref Range   SARS Coronavirus 2 NEGATIVE NEGATIVE  Lactic acid, plasma     Status: Abnormal   Collection Time:  03/16/19 10:06 AM  Result Value Ref Range   Lactic Acid, Venous 5.2 (HH) 0.5 - 1.9 mmol/L  Comprehensive metabolic panel     Status: Abnormal   Collection Time: 03/16/19 10:06 AM  Result Value Ref Range   Sodium 139 135 - 145 mmol/L   Potassium 3.9 3.5 - 5.1 mmol/L   Chloride 105 98 - 111 mmol/L   CO2 19 (L) 22 - 32 mmol/L   Glucose, Bld 352 (H) 70 - 99 mg/dL   BUN 13 8 - 23 mg/dL   Creatinine, Ser 1.11 (H) 0.44 - 1.00 mg/dL   Calcium 8.4 (L) 8.9 - 10.3 mg/dL   Total Protein 6.9 6.5 - 8.1 g/dL   Albumin 3.8 3.5 - 5.0 g/dL   AST 36 15 - 41 U/L   ALT 22 0 - 44 U/L   Alkaline Phosphatase 85 38 - 126 U/L   Total Bilirubin 1.0 0.3 - 1.2 mg/dL   GFR calc non Af Amer 52 (L) >60 mL/min   GFR calc Af Amer >60 >60 mL/min   Anion gap 15 5 - 15  CBC WITH DIFFERENTIAL     Status: Abnormal   Collection Time: 03/16/19 10:06 AM  Result Value Ref Range   WBC 8.3 4.0 - 10.5 K/uL   RBC 3.79 (L) 3.87 - 5.11 MIL/uL   Hemoglobin 13.5 12.0 - 15.0 g/dL   HCT 39.3 36.0 - 46.0 %   MCV 103.7 (H) 80.0 - 100.0 fL   MCH 35.6 (H) 26.0 - 34.0 pg   MCHC 34.4 30.0 - 36.0 g/dL   RDW 14.1 11.5 - 15.5 %   Platelets 319 150 - 400 K/uL   nRBC 0.0 0.0 - 0.2 %   Neutrophils Relative % 75 %   Neutro Abs 6.2 1.7 - 7.7 K/uL   Lymphocytes Relative 16 %   Lymphs Abs 1.4 0.7 - 4.0 K/uL   Monocytes Relative 5 %   Monocytes Absolute 0.4 0.1 - 1.0 K/uL   Eosinophils Relative 2 %   Eosinophils Absolute 0.2 0.0 - 0.5 K/uL   Basophils Relative 1 %   Basophils Absolute 0.1 0.0 - 0.1 K/uL   Immature Granulocytes 1 %   Abs Immature Granulocytes  0.05 0.00 - 0.07 K/uL  Procalcitonin     Status: None   Collection Time: 03/16/19 10:06 AM  Result Value Ref Range   Procalcitonin <0.10 ng/mL  Brain natriuretic peptide     Status: Abnormal   Collection Time: 03/16/19 10:06 AM  Result Value Ref Range   B Natriuretic Peptide 1,287.0 (H) 0.0 - 100.0 pg/mL  Troponin I - Add-On to previous collection     Status: None   Collection Time: 03/16/19 10:06 AM  Result Value Ref Range   Troponin I <0.03 <0.03 ng/mL  Blood gas, venous ([facility], [facility], [facility])     Status: Abnormal   Collection Time: 03/16/19 10:07 AM  Result Value Ref Range   pH, Ven 7.27 7.250 - 7.430   pCO2, Ven 52 44.0 - 60.0 mmHg   pO2, Ven 32.0 32.0 - 45.0 mmHg   Bicarbonate 23.9 20.0 - 28.0 mmol/L   Acid-base deficit 3.6 (H) 0.0 - 2.0 mmol/L   O2 Saturation 51.5 %   Patient temperature 37.0    Collection site VENOUS    Sample type VENOUS    ____________________________________________  EKG My review and personal interpretation at Time: 10:13   Indication: resp distress  Rate: 85  Rhythm: sinus Axis: right Other: v-paced  rhythm, occasional sinus ____________________________________________  RADIOLOGY  I personally reviewed all radiographic images ordered to evaluate for the above acute complaints and reviewed radiology reports and findings.  These findings were personally discussed with the patient.  Please see medical record for radiology report.  ____________________________________________   PROCEDURES  Procedure(s) performed:  .Critical Care Performed by: Merlyn Lot, MD Authorized by: Merlyn Lot, MD   Critical care provider statement:    Critical care time (minutes):  40   Critical care time was exclusive of:  Separately billable procedures and treating other patients   Critical care was necessary to treat or prevent imminent or life-threatening deterioration of the following conditions:  Respiratory failure   Critical care was time spent  personally by me on the following activities:  Development of treatment plan with patient or surrogate, discussions with consultants, evaluation of patient's response to treatment, examination of patient, obtaining history from patient or surrogate, ordering and performing treatments and interventions, ordering and review of laboratory studies, ordering and review of radiographic studies, pulse oximetry, re-evaluation of patient's condition and review of old charts      Critical Care performed: yes ____________________________________________   INITIAL IMPRESSION / [place] / ED COURSE  Pertinent labs & imaging results that were available during my care of the patient were reviewed by me and considered in my medical decision making (see chart for details).   DDX: Asthma, copd, CHF, pna, ptx, malignancy, Pe, anemia   DELORISE HUNKELE is a 68 y.o. who presents to the ED with respiratory distress as described above.  Patient placed on BiPAP.  Blood work radiographs will be sent for the by differential.  The patient will be placed on continuous pulse oximetry and telemetry for monitoring.  Laboratory evaluation will be sent to evaluate for the above complaints.     Clinical Course as of Mar 16 1127  Sun Mar 16, 2019  1025 Patient stabilizing appear much more comfortable on BiPAP.   [PR]  1108 Patient with significantly elevated lactate.  Do suspect secondary to congestive heart failure and flash pulmonary edema with hypoxia.  No white count.  No fever.  Does not seem consistent with sepsis therefore will hold off on IV fluid resuscitation at this time but will cover with antibiotics given her respiratory distress.   [PR]  1125 Covid test is negative.  She appears much more comfortable.  Based on her presentation I think this is mix of both COPD and CHF.  Lactic acidosis might be secondary to multiple nebulizers given in route.  Patient will be admitted for further respiratory  management and monitoring.  Have discussed with the patient and available family all diagnostics and treatments performed thus far and all questions were answered to the best of my ability. The patient demonstrates understanding and agreement with plan.    [PR]    Clinical Course User Index [PR] Merlyn Lot, MD    The patient was evaluated in Emergency Department today for the symptoms described in the history of present illness. He/she was evaluated in the context of the global COVID-19 pandemic, which necessitated consideration that the patient might be at risk for infection with the SARS-CoV-2 virus that causes COVID-19. Institutional protocols and algorithms that pertain to the evaluation of patients at risk for COVID-19 are in a state of rapid change based on information released by regulatory bodies including the CDC and federal and state organizations. These policies and algorithms were followed during the patient's care in the  ED.  As part of my medical decision making, I reviewed the following data within the [facility] notes reviewed and incorporated, Labs reviewed, notes from prior ED visits and [place] Controlled Substance Database   ____________________________________________   FINAL CLINICAL IMPRESSION(S) / ED DIAGNOSES  Final diagnoses:  Acute respiratory failure with hypoxia (HCC)  Acute on chronic congestive heart failure, unspecified heart failure type ([place])      NEW MEDICATIONS STARTED DURING THIS VISIT:  New Prescriptions   No medications on file     Note:  This document was prepared using Dragon voice recognition software and may include unintentional dictation errors.    Merlyn Lot, MD 03/16/19 1130

## 2019-03-16 NOTE — Progress Notes (Signed)
Family Meeting Note  Advance Directive:yes  Today a meeting took place with the Patient.  Patient is able to participate   The following clinical team members were present during this meeting:MD  The following were discussed:Patient's diagnosis: Status post AICD, coronary artery disease, heart failure, chronic kidney disease, COPD, depression, hypertension, presenting with shortness of breath from home via EMS not made better with her inhalers, noted wheezing, coughing, inability to lay flat in bed, patient was noted to have O2 saturation 88% in route, placed on CPAP, noted tripoding on presentation, admits to continued tobacco smoking, ER work-up noted for BNP greater than 1100, glucose 352, chest x-ray suggestive for heart failure, hospitalist asked to admit, patient evaluated in the emergency room, no apparent distress, resting comfortably in bed, patient states that she feels much better, denies chest pain, patient is now being admitted for acute hypoxic respiratory failure most likely secondary to acute on COPD and congestive heart failure exacerbations. , Patient's progosis: Unable to determine and Goals for treatment: Full Code  Additional follow-up to be provided: prn  Time spent during discussion:20 minutes  Gorden Harms, MD

## 2019-03-16 NOTE — H&P (Signed)
[facility] [place] NAME: Karine Garn    MR#:  [id number]  DATE OF BIRTH:  12-06-1950  DATE OF ADMISSION:  03/16/2019  PRIMARY CARE PHYSICIAN: [name], MD   REQUESTING/REFERRING PHYSICIAN:   CHIEF COMPLAINT:   Chief Complaint  Patient presents with  . Shortness of Breath    HISTORY OF PRESENT ILLNESS: Teona Vargus  is a 68 y.o. female with a known history per below presenting from home via EMS for worsening shortness of breath that started earlier this morning, not made better with her inhalers, noted wheezing, coughing, inability to lay flat in bed, patient was noted to have O2 saturation 88% in route, placed on CPAP, noted tripoding on presentation, admits to continued tobacco smoking, ER work-up noted for BNP greater than 1100, glucose 352, chest x-ray suggestive for heart failure, hospitalist asked to admit, patient evaluated in the emergency room, no apparent distress, resting comfortably in bed, patient states that she feels much better, denies chest pain, patient is now being admitted for acute hypoxic respiratory failure most likely secondary to acute on COPD and congestive heart failure exacerbations.  PAST MEDICAL HISTORY:   Past Medical History:  Diagnosis Date  . AICD (automatic cardioverter/defibrillator) present    on right side  . Anemia   . CAD (coronary artery disease)    s/p CABG  . CHF (congestive heart failure) ([place])   . Chronic kidney disease    renal artery stenosis  . Colon cancer ([place])   . COPD (chronic obstructive pulmonary disease) ([place])   . Depression   . Dyspnea   . GERD (gastroesophageal reflux disease)   . Hx of colonic polyps   . Hyperlipidemia   . Hypertension   . Mitral valve disorder    s/p mitral valve repair wth CABG  . Myocardial infarction ([place])   . Peripheral vascular disease ([place])   . Presence of permanent cardiac pacemaker    Pacemaker/ Defibrillator    PAST SURGICAL  HISTORY:  Past Surgical History:  Procedure Laterality Date  . arm surgery     fracture, has plates and screws  . CABG with mitral valve repair    . CARDIAC CATHETERIZATION    . COLON SURGERY     colon cancer  . COLONOSCOPY WITH PROPOFOL N/A 10/09/2016   Procedure: COLONOSCOPY WITH PROPOFOL;  Surgeon: Jonathon Bellows, MD;  Location: [facility] ENDOSCOPY;  Service: Endoscopy;  Laterality: N/A;  . COLONOSCOPY WITH PROPOFOL N/A 07/24/2018   Procedure: COLONOSCOPY WITH PROPOFOL;  Surgeon: Virgel Manifold, MD;  Location: [facility] ENDOSCOPY;  Service: Endoscopy;  Laterality: N/A;  . CORONARY ARTERY BYPASS GRAFT    . ENTEROSCOPY N/A 11/21/2018   Procedure: ENTEROSCOPY-BALLOON;  Surgeon: Jonathon Bellows, MD;  Location: [facility] ENDOSCOPY;  Service: Gastroenterology;  Laterality: N/A;  . ESOPHAGOGASTRODUODENOSCOPY (EGD) WITH PROPOFOL N/A 07/24/2018   Procedure: ESOPHAGOGASTRODUODENOSCOPY (EGD) WITH PROPOFOL;  Surgeon: Virgel Manifold, MD;  Location: [facility] ENDOSCOPY;  Service: Endoscopy;  Laterality: N/A;  . GIVENS CAPSULE STUDY N/A 08/16/2018   Procedure: GIVENS CAPSULE STUDY;  Surgeon: Virgel Manifold, MD;  Location: [facility] ENDOSCOPY;  Service: Endoscopy;  Laterality: N/A;  . pace maker defib  2016  . PERIPHERAL VASCULAR CATHETERIZATION N/A 10/23/2016   Procedure: Renal Angiography;  Surgeon: Algernon Huxley, MD;  Location: [facility];  Service: Cardiovascular;  Laterality: N/A;  . TOTAL ABDOMINAL HYSTERECTOMY  1999   history of abnormal pap    SOCIAL HISTORY:  Social  History   Tobacco Use  . Smoking status: Current Every Day Smoker    Packs/day: 0.50    Types: Cigarettes  . Smokeless tobacco: Never Used  . Tobacco comment: 1/2-1 ppd  Substance Use Topics  . Alcohol use: Yes    Alcohol/week: 2.0 standard drinks    Types: 2 Shots of liquor per week    FAMILY HISTORY:  Family History  Problem Relation Age of Onset  . Arthritis Mother   . Cancer Mother        uterus cancer  .  Hyperlipidemia Mother   . Hypertension Mother   . Heart disease Mother   . Diabetes Mother   . Hyperlipidemia Father   . Hypertension Father   . Heart disease Father   . Diabetes Father   . Cancer Sister        ovary cancer  . Diabetes Maternal Grandmother   . Hypertension Maternal Grandmother   . Arthritis Maternal Grandmother   . Hypertension Maternal Grandfather   . Hypertension Paternal Grandmother   . Hypertension Paternal Grandfather   . Heart disease Paternal Grandfather   . Breast cancer Neg Hx     DRUG ALLERGIES:  Allergies  Allergen Reactions  . Hydrocodone-Acetaminophen Nausea Only    REVIEW OF SYSTEMS:   CONSTITUTIONAL: No fever, fatigue or weakness.  EYES: No blurred or double vision.  EARS, NOSE, AND THROAT: No tinnitus or ear pain.  RESPIRATORY: + cough, shortness of breath, wheezing   CARDIOVASCULAR: No chest pain, +orthopnea, edema.  GASTROINTESTINAL: No nausea, vomiting, diarrhea or abdominal pain.  GENITOURINARY: No dysuria, hematuria.  ENDOCRINE: No polyuria, nocturia,  HEMATOLOGY: No anemia, easy bruising or bleeding SKIN: No rash or lesion. MUSCULOSKELETAL: No joint pain or arthritis.   NEUROLOGIC: No tingling, numbness, weakness.  PSYCHIATRY: No anxiety or depression.   MEDICATIONS AT HOME:  Prior to Admission medications   Medication Sig Start Date End Date Taking? Authorizing Provider  acetaminophen (TYLENOL) 500 MG tablet Maximum 6 tablets a day. Patient taking differently: Take 500 mg by mouth every 4 (four) hours as needed for fever. Maximum 6 tablets a day. 02/09/15  Yes Doss, Velora Heckler, RN  ADVAIR DISKUS 250-50 MCG/DOSE AEPB INHALE 1 PUFF BY MOUTH INTO THE LUNGS EVERY 12 HOURS RINSE MOUTH AFTER EACH USE 11/26/18  Yes [name], MD  ALPRAZolam Duanne Moron) 0.5 MG tablet Take 0.5-1 tablets (0.25-0.5 mg total) by mouth at bedtime. TAKE ONE TABLET BY MOUTH AT BEDTIME AS NEEDED FOR ANXIETY 02/24/19  Yes [name], MD  aspirin 81 MG tablet  Take 81 mg by mouth daily.   Yes [provider]  atorvastatin (LIPITOR) 40 MG tablet TAKE ONE TABLET BY MOUTH EVERY DAY Patient taking differently: Take 40 mg by mouth daily at 6 PM.  11/04/18  Yes [name], MD  carvedilol (COREG) 6.25 MG tablet TAKE ONE TABLET BY MOUTH TWICE DAILY WITH MEAL Patient taking differently: Take 6.25 mg by mouth 2 (two) times daily with a meal.  11/26/18  Yes [name], MD  clopidogrel (PLAVIX) 75 MG tablet TAKE ONE TABLET BY MOUTH EVERY DAY 11/26/18  Yes [name], MD  cyanocobalamin (,VITAMIN B-12,) 1000 MCG/ML injection Inject 1 mL weekly into the muscle monthly 11/08/18  Yes [name], MD  diphenhydrAMINE (DIPHENHIST) 25 mg capsule Take 25 mg by mouth at bedtime.   Yes [provider]  escitalopram (LEXAPRO) 10 MG tablet TAKE ONE TABLET BY MOUTH EVERY DAY 11/26/18  Yes [name], MD  furosemide (LASIX) 40 MG tablet Take 1 tablet (40 mg total) by mouth daily. 2 for weight gain > 2 lbs overnight 11/08/18  Yes [name], MD  ipratropium-albuterol (DUONEB) 0.5-2.5 (3) MG/3ML SOLN Take 3 mLs by nebulization every 6 (six) hours as needed. 08/05/18  Yes [name], MD  omeprazole (PRILOSEC) 40 MG capsule TAKE ONE CAPSULE BY MOUTH EVERY DAY 11/26/18  Yes [name], MD  potassium chloride (K-DUR) 10 MEQ tablet Take 1 tablet (10 mEq total) by mouth daily. 11/08/18  Yes [name], MD  sacubitril-valsartan (ENTRESTO) 24-26 MG Take 1 tablet by mouth 2 (two) times daily. 11/04/18  Yes Hackney, Tina A, FNP  SPIRIVA RESPIMAT 2.5 MCG/ACT AERS ACTIVATE AND INHALE TWO SPRAYS ORALLY INTO THE LUNGS ONCE DAILY. 01/09/18  Yes [name], MD  nitroGLYCERIN (NITROSTAT) 0.4 MG SL tablet Place 1 tablet (0.4 mg total) under the tongue every 5 (five) minutes as needed for chest pain. 06/19/16   [name], MD  Syringe/Needle, Disp, (SYRINGE 3CC/25GX1") 25G X 1" 3 ML MISC Use for b12 injections 07/29/18   [name], MD   VENTOLIN HFA 108 (90 Base) MCG/ACT inhaler INHALE 2 PUFFS BY MOUTH INTO THE LUNGS EVERY 6 HOURS AS NEEDED FOR WHEEZING 09/09/18   [name], MD      PHYSICAL EXAMINATION:   VITAL SIGNS: Blood pressure 99/82, pulse 82, temperature 97.8 F (36.6 C), temperature source Oral, resp. rate (!) 36, weight 77 kg, SpO2 100 %.  GENERAL:  68 y.o.-year-old patient lying in the bed with no acute distress.  Frail-appearing EYES: Pupils equal, round, reactive to light and accommodation. No scleral icterus. Extraocular muscles intact.  HEENT: Head atraumatic, normocephalic. Oropharynx and nasopharynx clear.  NECK:  Supple, no jugular venous distention. No thyroid enlargement, no tenderness.  LUNGS: Mild Rales at the bases bilaterally with rhonchi, diminished breath sounds. No use of accessory muscles of respiration.  CARDIOVASCULAR: S1, S2 normal. No murmurs, rubs, or gallops.  ABDOMEN: Soft, nontender, nondistended. Bowel sounds present. No organomegaly or mass.  EXTREMITIES: No pedal edema, cyanosis, or clubbing.  NEUROLOGIC: Cranial nerves II through XII are intact. Muscle strength 5/5 in all extremities. Sensation intact. Gait not checked.  PSYCHIATRIC: The patient is alert and oriented x 3.  SKIN: No obvious rash, lesion, or ulcer.   LABORATORY PANEL:   CBC Recent Labs  Lab 03/16/19 1006  WBC 8.3  HGB 13.5  HCT 39.3  PLT 319  MCV 103.7*  MCH 35.6*  MCHC 34.4  RDW 14.1  LYMPHSABS 1.4  MONOABS 0.4  EOSABS 0.2  BASOSABS 0.1   ------------------------------------------------------------------------------------------------------------------  Chemistries  Recent Labs  Lab 03/16/19 1006  NA 139  K 3.9  CL 105  CO2 19*  GLUCOSE 352*  BUN 13  CREATININE 1.11*  CALCIUM 8.4*  AST 36  ALT 22  ALKPHOS 85  BILITOT 1.0   ------------------------------------------------------------------------------------------------------------------ estimated creatinine clearance is 49.6  mL/min (A) (by C-G formula based on SCr of 1.11 mg/dL (H)). ------------------------------------------------------------------------------------------------------------------ No results for input(s): TSH, T4TOTAL, T3FREE, THYROIDAB in the last 72 hours.  Invalid input(s): FREET3   Coagulation profile No results for input(s): INR, PROTIME in the last 168 hours. ------------------------------------------------------------------------------------------------------------------- No results for input(s): DDIMER in the last 72 hours. -------------------------------------------------------------------------------------------------------------------  Cardiac Enzymes Recent Labs  Lab 03/16/19 1006  TROPONINI <0.03   ------------------------------------------------------------------------------------------------------------------ Invalid input(s): POCBNP  ---------------------------------------------------------------------------------------------------------------  Urinalysis    Component Value Date/Time  COLORURINE STRAW (A) 06/20/2016 2348   APPEARANCEUR CLEAR (A) 06/20/2016 2348   APPEARANCEUR Clear 08/19/2014 0025   LABSPEC 1.003 (L) 06/20/2016 2348   LABSPEC 1.009 08/19/2014 0025   PHURINE 6.0 06/20/2016 2348   GLUCOSEU NEGATIVE 06/20/2016 2348   GLUCOSEU 50 mg/dL 08/19/2014 0025   HGBUR 1+ (A) 06/20/2016 2348   BILIRUBINUR NEGATIVE 06/20/2016 2348   BILIRUBINUR Negative 08/19/2014 0025   KETONESUR NEGATIVE 06/20/2016 2348   PROTEINUR NEGATIVE 06/20/2016 2348   NITRITE NEGATIVE 06/20/2016 2348   LEUKOCYTESUR NEGATIVE 06/20/2016 2348   LEUKOCYTESUR Negative 08/19/2014 0025     RADIOLOGY: Dg Chest Port 1 View  Result Date: 03/16/2019 CLINICAL DATA:  Short of breath congestive heart failure EXAM: PORTABLE CHEST 1 VIEW COMPARISON:  11/08/2018 FINDINGS: RIGHT-sided pacemaker. Midline sternotomy. Enlarged cardiac silhouette. Bilateral pleural effusions. Linear peripheral  interstitial markings. No pneumothorax. No consolidation. LEFT clavicle fixation. IMPRESSION: Cardiomegaly, pleural effusions and interstitial pulmonary edema consistent congestive heart failure. Electronically Signed   By: Suzy Bouchard M.D.   On: 03/16/2019 10:45    EKG: Orders placed or performed during the hospital encounter of 03/16/19  . ED EKG 12-Lead  . ED EKG 12-Lead  . EKG 12-Lead  . EKG 12-Lead    IMPRESSION AND PLAN: *Acute hypoxic respiratory failure Most likely secondary to multifactorial process that includes acute on chronic obstructive pulmonary disease > congestive heart failure exacerbation We will wean off CPAP, admit to telemetry bed on our congestive heart failure protocol, supplemental oxygen with weaning as tolerated  *Acute on COPD exacerbation IV Solu-Medrol with tapering as tolerated, aggressive pulmonary toilet and bronchodilator therapy, mucolytic agents, empiric doxycycline, encouraged tobacco cessation, respiratory therapy to see, check sputum cultures  *Acute on chronic diastolic congestive heart failure exacerbation Most recent echocardiogram with preserved systolic function/ejection fraction Congestive heart failure protocol, IV Lasix twice daily for now, Entresto, aspirin, Coreg, strict I&O monitoring, daily weights, continue close medical monitoring Repeat echocardiogram  *Acute hyperglycemia without history of diabetes  Hemoglobin A1c earlier this year was 5.2  Repeat hemoglobin A1c, sliding scale insulin with Accu-Cheks per routine for now   *Chronic hyperlipidemia, unspecified Continue statin therapy  *History of coronary artery disease Stable Continue aspirin, statin therapy, Coreg, Plavix, Entresto, nitrates as needed  *Chronic benign essential hypertension Stable Continue home regiment  *Status post AICD Stable  *History of GERD without esophagitis PPI daily  Disposition Home in 1 to 2 days barring any complication DVT  prophylaxis with Lovenox subcu   All the records are reviewed and case discussed with ED provider. Management plans discussed with the patient, family and they are in agreement.  CODE STATUS:full Code Status History    Date Active Date Inactive Code Status Order ID Comments User Context   07/17/2018 1214 07/19/2018 1548 Full Code [id number]  Dustin Flock, MD ED   10/23/2016 0917 10/23/2016 1630 Full Code [id number]  Algernon Huxley, MD Inpatient   06/21/2016 0437 06/21/2016 1758 Full Code [id number]  Harrie Foreman, MD Inpatient       TOTAL TIME TAKING CARE OF THIS PATIENT: 40 minutes.    Avel Peace Laresha Bacorn M.D on 03/16/2019   Between 7am to 6pm - Pager - [phone]  After 6pm go to [email or web address] - password [email or web address] [facility]  Office  [phone]  CC: Primary care physician; [name], MD   Note: This dictation was prepared with Dragon dictation along with smaller phrase technology. Any transcriptional errors that result from this process are unintentional.

## 2019-03-16 NOTE — Plan of Care (Signed)
  Problem: Clinical Measurements: Goal: Ability to maintain clinical measurements within normal limits will improve Outcome: Not Progressing Note:  Today's creatinine level is increased at 1.1. Will continue to monitor renal function labs. Wenda Low [facility]

## 2019-03-16 NOTE — ED Triage Notes (Signed)
Pt presents via EMS c/o SOB. Hx CHF per EMS report. Placed on CPAP per EMS for saturation 88%.

## 2019-03-16 NOTE — ED Notes (Signed)
ED TO INPATIENT HANDOFF REPORT  ED Nurse Name and Phone #: Emmilynn Marut [phone]  S Name/Age/Gender Ashley [name] 68 y.o. female Room/Bed: ED11A/ED11A  Code Status   Code Status: Prior  Home/SNF/Other Home Patient oriented to: a&ox3 Is this baseline? Yes   Triage Complete: Triage complete  Chief Complaint Shortness of Breath  Triage Note Pt presents via EMS c/o SOB. Hx CHF per EMS report. Placed on CPAP per EMS for saturation 88%.    Allergies Allergies  Allergen Reactions  . Hydrocodone-Acetaminophen Nausea Only    Level of Care/Admitting Diagnosis ED Disposition    ED Disposition Condition [facility]: [place] [[id number]]  Level of Care: Telemetry [5]  Covid Evaluation: N/A  Diagnosis: Respiratory failure [facility]) [[id number]]  Admitting Physician: Gorden Harms [[id number]]  Attending Physician: Gorden Harms [[id number]]  Estimated length of stay: past midnight tomorrow  Certification:: I certify this patient will need inpatient services for at least 2 midnights  Bed request comments: 2a  PT Class (Do Not Modify): Inpatient [101]  PT Acc Code (Do Not Modify): Private [1]       B Medical/Surgery History Past Medical History:  Diagnosis Date  . AICD (automatic cardioverter/defibrillator) present    on right side  . Anemia   . CAD (coronary artery disease)    s/p CABG  . CHF (congestive heart failure) ([place])   . Chronic kidney disease    renal artery stenosis  . Colon cancer ([place])   . COPD (chronic obstructive pulmonary disease) ([place])   . Depression   . Dyspnea   . GERD (gastroesophageal reflux disease)   . Hx of colonic polyps   . Hyperlipidemia   . Hypertension   . Mitral valve disorder    s/p mitral valve repair wth CABG  . Myocardial infarction ([place])   . Peripheral vascular disease ([place])   . Presence of permanent cardiac pacemaker    Pacemaker/ Defibrillator   Past Surgical History:  Procedure Laterality  Date  . arm surgery     fracture, has plates and screws  . CABG with mitral valve repair    . CARDIAC CATHETERIZATION    . COLON SURGERY     colon cancer  . COLONOSCOPY WITH PROPOFOL N/A 10/09/2016   Procedure: COLONOSCOPY WITH PROPOFOL;  Surgeon: Jonathon Bellows, MD;  Location: [facility] ENDOSCOPY;  Service: Endoscopy;  Laterality: N/A;  . COLONOSCOPY WITH PROPOFOL N/A 07/24/2018   Procedure: COLONOSCOPY WITH PROPOFOL;  Surgeon: Virgel Manifold, MD;  Location: [facility] ENDOSCOPY;  Service: Endoscopy;  Laterality: N/A;  . CORONARY ARTERY BYPASS GRAFT    . ENTEROSCOPY N/A 11/21/2018   Procedure: ENTEROSCOPY-BALLOON;  Surgeon: Jonathon Bellows, MD;  Location[facility] ENDOSCOPY;  Service: Gastroenterology;  Laterality: N/A;  . ESOPHAGOGASTRODUODENOSCOPY (EGD) WITH PROPOFOL N/A 07/24/2018   Procedure: ESOPHAGOGASTRODUODENOSCOPY (EGD) WITH PROPOFOL;  Surgeon: Virgel Manifold, MD;  Location: [facility] ENDOSCOPY;  Service: Endoscopy;  Laterality: N/A;  . GIVENS CAPSULE STUDY N/A 08/16/2018   Procedure: GIVENS CAPSULE STUDY;  Surgeon: Virgel Manifold, MD;  Location: [facility] ENDOSCOPY;  Service: Endoscopy;  Laterality: N/A;  . pace maker defib  2016  . PERIPHERAL VASCULAR CATHETERIZATION N/A 10/23/2016   Procedure: Renal Angiography;  Surgeon: Algernon Huxley, MD;  Location: [facility] LAB;  Service: Cardiovascular;  Laterality: N/A;  . TOTAL ABDOMINAL HYSTERECTOMY  1999   history of abnormal pap     A IV Location/Drains/Wounds Patient Lines/Drains/Airways Status   Active Line/Drains/Airways  Name:   Placement date:   Placement time:   Site:   Days:   Peripheral IV 03/16/19 Left Hand   03/16/19    1030    Hand   less than 1   Peripheral IV 03/16/19 Right Antecubital   03/16/19    1030    Antecubital   less than 1          Intake/Output Last 24 hours No intake or output data in the 24 hours ending 03/16/19 1220  Labs/Imaging Results for orders placed or performed during the hospital encounter of  03/16/19 (from the past 48 hour(s))  SARS Coronavirus 2 (CEPHEID- Performed in [facility] hospital lab), Hosp Order     Status: None   Collection Time: 03/16/19 10:06 AM  Result Value Ref Range   SARS Coronavirus 2 NEGATIVE NEGATIVE    Comment: (NOTE) If result is NEGATIVE SARS-CoV-2 target nucleic acids are NOT DETECTED. The SARS-CoV-2 RNA is generally detectable in upper and lower  respiratory specimens during the acute phase of infection. The lowest  concentration of SARS-CoV-2 viral copies this assay can detect is 250  copies / mL. A negative result does not preclude SARS-CoV-2 infection  and should not be used as the sole basis for treatment or other  patient management decisions.  A negative result may occur with  improper specimen collection / handling, submission of specimen other  than nasopharyngeal swab, presence of viral mutation(s) within the  areas targeted by this assay, and inadequate number of viral copies  (<250 copies / mL). A negative result must be combined with clinical  observations, patient history, and epidemiological information. If result is POSITIVE SARS-CoV-2 target nucleic acids are DETECTED. The SARS-CoV-2 RNA is generally detectable in upper and lower  respiratory specimens dur ing the acute phase of infection.  Positive  results are indicative of active infection with SARS-CoV-2.  Clinical  correlation with patient history and other diagnostic information is  necessary to determine patient infection status.  Positive results do  not rule out bacterial infection or co-infection with other viruses. If result is PRESUMPTIVE POSTIVE SARS-CoV-2 nucleic acids MAY BE PRESENT.   A presumptive positive result was obtained on the submitted specimen  and confirmed on repeat testing.  While 2019 novel coronavirus  (SARS-CoV-2) nucleic acids may be present in the submitted sample  additional confirmatory testing may be necessary for epidemiological  and / or  clinical management purposes  to differentiate between  SARS-CoV-2 and other Sarbecovirus currently known to infect humans.  If clinically indicated additional testing with an alternate test  methodology [id number]) is advised. The SARS-CoV-2 RNA is generally  detectable in upper and lower respiratory sp ecimens during the acute  phase of infection. The expected result is Negative. Fact Sheet for Patients:  StrictlyIdeas.no Fact Sheet for Healthcare Providers: BankingDealers.co.za This test is not yet approved or cleared by the [place] FDA and has been authorized for detection and/or diagnosis of SARS-CoV-2 by FDA under an Emergency Use Authorization (EUA).  This EUA will remain in effect (meaning this test can be used) for the duration of the COVID-19 declaration under Section 564(b)(1) of the Act, 21 U.S.C. section 360bbb-3(b)(1), unless the authorization is terminated or revoked sooner. Performed at [facility], [address] [place], [place] [address]   Lactic acid, plasma     Status: Abnormal   Collection Time: 03/16/19 10:06 AM  Result Value Ref Range   Lactic Acid, Venous 5.2 (HH) 0.5 - 1.9  mmol/L    Comment: CRITICAL RESULT CALLED TO, READ BACK BY AND VERIFIED WITH HUNTER ORE @1043  03/16/19 AKT Performed at [facility], [address] [place], [place] [address]   Comprehensive metabolic panel     Status: Abnormal   Collection Time: 03/16/19 10:06 AM  Result Value Ref Range   Sodium 139 135 - 145 mmol/L   Potassium 3.9 3.5 - 5.1 mmol/L   Chloride 105 98 - 111 mmol/L   CO2 19 (L) 22 - 32 mmol/L   Glucose, Bld 352 (H) 70 - 99 mg/dL   BUN 13 8 - 23 mg/dL   Creatinine, Ser 1.11 (H) 0.44 - 1.00 mg/dL   Calcium 8.4 (L) 8.9 - 10.3 mg/dL   Total Protein 6.9 6.5 - 8.1 g/dL   Albumin 3.8 3.5 - 5.0 g/dL   AST 36 15 - 41 U/L   ALT 22 0 - 44 U/L   Alkaline Phosphatase 85 38 - 126 U/L   Total Bilirubin 1.0  0.3 - 1.2 mg/dL   GFR calc non Af Amer 52 (L) >60 mL/min   GFR calc Af Amer >60 >60 mL/min   Anion gap 15 5 - 15    Comment: Performed at [facility], [address] [place], [place] [address]  CBC WITH DIFFERENTIAL     Status: Abnormal   Collection Time: 03/16/19 10:06 AM  Result Value Ref Range   WBC 8.3 4.0 - 10.5 K/uL   RBC 3.79 (L) 3.87 - 5.11 MIL/uL   Hemoglobin 13.5 12.0 - 15.0 g/dL   HCT 39.3 36.0 - 46.0 %   MCV 103.7 (H) 80.0 - 100.0 fL   MCH 35.6 (H) 26.0 - 34.0 pg   MCHC 34.4 30.0 - 36.0 g/dL   RDW 14.1 11.5 - 15.5 %   Platelets 319 150 - 400 K/uL   nRBC 0.0 0.0 - 0.2 %   Neutrophils Relative % 75 %   Neutro Abs 6.2 1.7 - 7.7 K/uL   Lymphocytes Relative 16 %   Lymphs Abs 1.4 0.7 - 4.0 K/uL   Monocytes Relative 5 %   Monocytes Absolute 0.4 0.1 - 1.0 K/uL   Eosinophils Relative 2 %   Eosinophils Absolute 0.2 0.0 - 0.5 K/uL   Basophils Relative 1 %   Basophils Absolute 0.1 0.0 - 0.1 K/uL   Immature Granulocytes 1 %   Abs Immature Granulocytes 0.05 0.00 - 0.07 K/uL    Comment: Performed at [facility], [address] [place], [place] [address]  Procalcitonin     Status: None   Collection Time: 03/16/19 10:06 AM  Result Value Ref Range   Procalcitonin <0.10 ng/mL    Comment:        Interpretation: PCT (Procalcitonin) <= 0.5 ng/mL: Systemic infection (sepsis) is not likely. Local bacterial infection is possible. (NOTE)       Sepsis PCT Algorithm           Lower Respiratory Tract                                      Infection PCT Algorithm    ----------------------------     ----------------------------         PCT < 0.25 ng/mL                PCT < 0.10 ng/mL         Strongly encourage  Strongly discourage   discontinuation of antibiotics    initiation of antibiotics    ----------------------------     -----------------------------       PCT 0.25 - 0.50 ng/mL            PCT 0.10 - 0.25 ng/mL               OR       >80% decrease  in PCT            Discourage initiation of                                            antibiotics      Encourage discontinuation           of antibiotics    ----------------------------     -----------------------------         PCT >= 0.50 ng/mL              PCT 0.26 - 0.50 ng/mL               AND        <80% decrease in PCT             Encourage initiation of                                             antibiotics       Encourage continuation           of antibiotics    ----------------------------     -----------------------------        PCT >= 0.50 ng/mL                  PCT > 0.50 ng/mL               AND         increase in PCT                  Strongly encourage                                      initiation of antibiotics    Strongly encourage escalation           of antibiotics                                     -----------------------------                                           PCT <= 0.25 ng/mL                                                 OR                                        >  80% decrease in PCT                                     Discontinue / Do not initiate                                             antibiotics Performed at [facility], [address] [place], [place] [address]   Brain natriuretic peptide     Status: Abnormal   Collection Time: 03/16/19 10:06 AM  Result Value Ref Range   B Natriuretic Peptide 1,287.0 (H) 0.0 - 100.0 pg/mL    Comment: Performed at [facility], [address] [place], [place] [address]  Troponin I - Add-On to previous collection     Status: None   Collection Time: 03/16/19 10:06 AM  Result Value Ref Range   Troponin I <0.03 <0.03 ng/mL    Comment: Performed at [facility], [address] [place], [place] [address]  Blood gas, venous ([facility], [facility], [facility])     Status: Abnormal   Collection Time: 03/16/19 10:07 AM  Result Value Ref Range   pH, Ven 7.27 7.250 - 7.430   pCO2, Ven 52 44.0 -  60.0 mmHg   pO2, Ven 32.0 32.0 - 45.0 mmHg   Bicarbonate 23.9 20.0 - 28.0 mmol/L   Acid-base deficit 3.6 (H) 0.0 - 2.0 mmol/L   O2 Saturation 51.5 %   Patient temperature 37.0    Collection site VENOUS    Sample type VENOUS     Comment: Performed at [facility], [address], [place], [place] [address]   Dg Chest Port 1 View  Result Date: 03/16/2019 CLINICAL DATA:  Short of breath congestive heart failure EXAM: PORTABLE CHEST 1 VIEW COMPARISON:  11/08/2018 FINDINGS: RIGHT-sided pacemaker. Midline sternotomy. Enlarged cardiac silhouette. Bilateral pleural effusions. Linear peripheral interstitial markings. No pneumothorax. No consolidation. LEFT clavicle fixation. IMPRESSION: Cardiomegaly, pleural effusions and interstitial pulmonary edema consistent congestive heart failure. Electronically Signed   By: Suzy Bouchard M.D.   On: 03/16/2019 10:45    Pending Labs Unresulted Labs (From admission, onward)    Start     Ordered   03/16/19 1007  Urinalysis, Complete w Microscopic  ONCE - STAT,   STAT     03/16/19 1006   03/16/19 1006  Lactic acid, plasma  STAT Now then every 3 hours,   STAT     03/16/19 1006   03/16/19 1006  Blood Culture (routine x 2)  BLOOD CULTURE X 2,   STAT    Question:  Patient immune status  Answer:  Normal   03/16/19 1006   03/16/19 1006  Urine culture  ONCE - STAT,   STAT    Question:  Patient immune status  Answer:  Normal   03/16/19 1006   Signed and Held  HIV antibody (Routine Testing)  Once,   R     Signed and Held   Signed and Held  Basic metabolic panel  Daily,   R     Signed and Held   Signed and Held  CBC  (enoxaparin (LOVENOX)    CrCl >/= 30 ml/min)  Once,   R    Comments:  Baseline for enoxaparin therapy IF NOT ALREADY DRAWN.  Notify MD if PLT <  100 K.    Signed and Held   Signed and Held  Creatinine, serum  (enoxaparin (LOVENOX)    CrCl >/= 30 ml/min)  Once,   R    Comments:  Baseline for enoxaparin therapy IF NOT ALREADY DRAWN.     Signed and Held   Signed and Held  Creatinine, serum  (enoxaparin (LOVENOX)    CrCl >/= 30 ml/min)  Weekly,   R    Comments:  while on enoxaparin therapy    Signed and Held   Signed and Held  Hemoglobin A1c  Once,   R     Signed and Held          Vitals/Pain Today's Vitals   03/16/19 1030 03/16/19 1045 03/16/19 1100 03/16/19 1130  BP: 115/79  113/73 99/82  Pulse: 80 80 81 82  Resp: 18 18 (!) 36   Temp:      TempSrc:      SpO2: 100% 100% 100% 100%  Weight:      PainSc:        Isolation Precautions Droplet and Contact precautions  Medications Medications  azithromycin (ZITHROMAX) 500 mg in sodium chloride 0.9 % 250 mL IVPB (500 mg Intravenous New Bag/Given 03/16/19 1125)  vancomycin (VANCOCIN) 2,000 mg in sodium chloride 0.9 % 500 mL IVPB (2,000 mg Intravenous New Bag/Given 03/16/19 1207)  insulin aspart (novoLOG) injection 10 Units (has no administration in time range)  nitroGLYCERIN (NITROSTAT) SL tablet 0.4 mg (has no administration in time range)  morphine 2 MG/ML injection 2 mg (has no administration in time range)  methylPREDNISolone sodium succinate (SOLU-MEDROL) 125 mg/2 mL injection 125 mg (125 mg Intravenous Given 03/16/19 1031)  ipratropium-albuterol (DUONEB) 0.5-2.5 (3) MG/3ML nebulizer solution 3 mL (3 mLs Nebulization Given 03/16/19 1029)  ipratropium-albuterol (DUONEB) 0.5-2.5 (3) MG/3ML nebulizer solution 3 mL (3 mLs Nebulization Given 03/16/19 1029)  ceFEPIme (MAXIPIME) 2 g in sodium chloride 0.9 % 100 mL IVPB (0 g Intravenous Stopped 03/16/19 1206)  furosemide (LASIX) injection 40 mg (40 mg Intravenous Given 03/16/19 1120)    Mobility walks High fall risk   Focused Assessments Pulmonary Assessment Handoff:  Lung sounds:   O2 Device: Bi-PAP        R Recommendations: See Admitting Provider Note  Report given to:   Additional Notes:

## 2019-03-17 ENCOUNTER — Inpatient Hospital Stay
Admit: 2019-03-17 | Discharge: 2019-03-17 | Disposition: A | Discharge status: Home / Self Care | Payer: Medicare Other | Attending: Family Medicine | Admitting: Family Medicine

## 2019-03-17 DIAGNOSIS — I10 Essential (primary) hypertension: Secondary | ICD-10-CM | POA: Diagnosis not present

## 2019-03-17 DIAGNOSIS — Z20828 Contact with and (suspected) exposure to other viral communicable diseases: Secondary | ICD-10-CM | POA: Diagnosis not present

## 2019-03-17 DIAGNOSIS — I110 Hypertensive heart disease with heart failure: Secondary | ICD-10-CM | POA: Diagnosis not present

## 2019-03-17 DIAGNOSIS — I5032 Chronic diastolic (congestive) heart failure: Secondary | ICD-10-CM | POA: Diagnosis not present

## 2019-03-17 DIAGNOSIS — I5033 Acute on chronic diastolic (congestive) heart failure: Secondary | ICD-10-CM | POA: Diagnosis not present

## 2019-03-17 DIAGNOSIS — R008 Other abnormalities of heart beat: Secondary | ICD-10-CM | POA: Diagnosis not present

## 2019-03-17 DIAGNOSIS — J441 Chronic obstructive pulmonary disease with (acute) exacerbation: Secondary | ICD-10-CM | POA: Diagnosis not present

## 2019-03-17 DIAGNOSIS — E785 Hyperlipidemia, unspecified: Secondary | ICD-10-CM | POA: Diagnosis not present

## 2019-03-17 DIAGNOSIS — J9601 Acute respiratory failure with hypoxia: Secondary | ICD-10-CM | POA: Diagnosis not present

## 2019-03-17 LAB — URINALYSIS, COMPLETE (UACMP) WITH MICROSCOPIC
Bilirubin Urine: NEGATIVE
Glucose, UA: NEGATIVE mg/dL
Hgb urine dipstick: NEGATIVE
Ketones, ur: NEGATIVE mg/dL
Leukocytes,Ua: NEGATIVE
Nitrite: NEGATIVE
Protein, ur: NEGATIVE mg/dL
Specific Gravity, Urine: 1.008 (ref 1.005–1.030)
pH: 6 (ref 5.0–8.0)

## 2019-03-17 LAB — BASIC METABOLIC PANEL
Anion gap: 13 (ref 5–15)
BUN: 25 mg/dL — ABNORMAL HIGH (ref 8–23)
CO2: 24 mmol/L (ref 22–32)
Calcium: 8.6 mg/dL — ABNORMAL LOW (ref 8.9–10.3)
Chloride: 106 mmol/L (ref 98–111)
Creatinine, Ser: 1.27 mg/dL — ABNORMAL HIGH (ref 0.44–1.00)
GFR calc Af Amer: 51 mL/min — ABNORMAL LOW (ref >60)
GFR calc non Af Amer: 44 mL/min — ABNORMAL LOW (ref >60)
Glucose, Bld: 197 mg/dL — ABNORMAL HIGH (ref 70–99)
Potassium: 3.3 mmol/L — ABNORMAL LOW (ref 3.5–5.1)
Sodium: 143 mmol/L (ref 135–145)

## 2019-03-17 LAB — GLUCOSE, CAPILLARY
Glucose-Capillary: 183 mg/dL — ABNORMAL HIGH (ref 70–99)
Glucose-Capillary: 255 mg/dL — ABNORMAL HIGH (ref 70–99)

## 2019-03-17 LAB — ECHOCARDIOGRAM COMPLETE
Height: 63 in
Weight: 2754.87 oz

## 2019-03-17 MED ORDER — DOXYCYCLINE HYCLATE 100 MG PO TABS: 100.0000 mg | ORAL_TABLET | Freq: Two times a day (BID) | ORAL | Status: DC

## 2019-03-17 MED ORDER — TIOTROPIUM BROMIDE MONOHYDRATE 2.5 MCG/ACT IN AERS: 18.0000 ug | INHALATION_SPRAY | Freq: Every morning | RESPIRATORY_TRACT | 0 refills | Status: DC

## 2019-03-17 MED ORDER — DOXYCYCLINE HYCLATE 100 MG PO TABS: 100.0000 mg | ORAL_TABLET | Freq: Two times a day (BID) | ORAL | 0 refills | Status: DC

## 2019-03-17 MED ORDER — TIOTROPIUM BROMIDE MONOHYDRATE 18 MCG IN CAPS
18.0000 ug | ORAL_CAPSULE | Freq: Every day | RESPIRATORY_TRACT | Status: DC
  Administered 2019-03-17: 18 ug via RESPIRATORY_TRACT
  Filled 2019-03-17: qty 5

## 2019-03-17 MED ORDER — NICOTINE 14 MG/24HR TD PT24: 14.0000 mg | MEDICATED_PATCH | Freq: Every day | TRANSDERMAL | 0 refills | Status: DC

## 2019-03-17 MED ORDER — POTASSIUM CHLORIDE CRYS ER 20 MEQ PO TBCR
40.0000 meq | EXTENDED_RELEASE_TABLET | Freq: Once | ORAL | Status: AC
  Administered 2019-03-17: 40 meq via ORAL
  Filled 2019-03-17: qty 2

## 2019-03-17 MED ORDER — ACETAMINOPHEN 325 MG PO TABS: 650.0000 mg | ORAL_TABLET | ORAL | Status: DC | PRN

## 2019-03-17 MED ORDER — PREDNISONE 10 MG PO TABS: ORAL_TABLET | ORAL | 0 refills | Status: DC

## 2019-03-17 NOTE — Discharge Summary (Signed)
[facility] [place] NAME: Ashley [name]    MR#:  [id number]  DATE OF BIRTH:  20-Sep-1951  DATE OF ADMISSION:  03/16/2019 ADMITTING PHYSICIAN: Gorden Harms, MD  DATE OF DISCHARGE: 03/17/2019  1:15 PM  PRIMARY CARE PHYSICIAN: [name], MD    ADMISSION DIAGNOSIS:  Chronic diastolic heart failure (HCC) [I50.32] Acute respiratory failure with hypoxia ([place]) [J96.01] Coronary artery disease involving coronary bypass graft of native heart with unstable angina pectoris ([place]) [I25.700] Acute on chronic congestive heart failure, unspecified heart failure type ([place]) [I50.9]  DISCHARGE DIAGNOSIS:  Active Problems:   Respiratory failure ([place])   SECONDARY DIAGNOSIS:   Past Medical History:  Diagnosis Date  . AICD (automatic cardioverter/defibrillator) present    on right side  . Anemia   . CAD (coronary artery disease)    s/p CABG  . CHF (congestive heart failure) ([place])   . Chronic kidney disease    renal artery stenosis  . Colon cancer ([place])   . COPD (chronic obstructive pulmonary disease) ([place])   . Depression   . Dyspnea   . GERD (gastroesophageal reflux disease)   . Hx of colonic polyps   . Hyperlipidemia   . Hypertension   . Mitral valve disorder    s/p mitral valve repair wth CABG  . Myocardial infarction ([place])   . Peripheral vascular disease ([place])   . Presence of permanent cardiac pacemaker    Pacemaker/ Defibrillator    HOSPITAL COURSE:   1.  Acute hypoxic respiratory failure.  The patient breathing much better and was tapered off oxygen. 2.  Acute COPD exacerbation.  The patient was given Solu-Medrol and doxycycline and nebulizer treatments during the hospital course.  Patient was prescribed 3 more days of prednisone upon going home.  She will go back on her usual Advair and nebulizer treatments.  She states that she does not have Spiriva.  Hospital to home Spiriva program ordered. 3.  Acute on chronic diastolic  congestive heart failure.  The patient was given IV Lasix.  The patient is on Entresto, Coreg.  Lungs are clear upon discharge.  I believe this is more likely to be COPD exacerbation. 4.  Impaired fasting glucose.  Sugars high with steroids.  Patient is not a diabetic. 5.  Hyperlipidemia on statin 6.  History of CAD on aspirin statin Coreg Plavix Entresto 7.  Hypertension.  Blood pressure stable on usual meds  DISCHARGE CONDITIONS:   Satisfactory  CONSULTS OBTAINED:  None  DRUG ALLERGIES:   Allergies  Allergen Reactions  . Hydrocodone-Acetaminophen Nausea Only    DISCHARGE MEDICATIONS:   Allergies as of 03/17/2019      Reactions   Hydrocodone-acetaminophen Nausea Only      Medication List    STOP taking these medications   SYRINGE 3CC/25GX1" 25G X 1" 3 ML Misc     TAKE these medications   acetaminophen 500 MG tablet Commonly known as:  TYLENOL Maximum 6 tablets a day. What changed:    how much to take  how to take this  when to take this  reasons to take this   Advair Diskus 250-50 MCG/DOSE Aepb Generic drug:  Fluticasone-Salmeterol INHALE 1 PUFF BY MOUTH INTO THE LUNGS EVERY 12 HOURS RINSE MOUTH AFTER EACH USE   ALPRAZolam 0.5 MG tablet Commonly known as:  XANAX Take 0.5-1 tablets (0.25-0.5 mg total) by mouth at bedtime. TAKE ONE TABLET BY MOUTH AT BEDTIME AS NEEDED FOR ANXIETY  aspirin 81 MG tablet Take 81 mg by mouth daily.   atorvastatin 40 MG tablet Commonly known as:  LIPITOR TAKE ONE TABLET BY MOUTH EVERY DAY What changed:  when to take this   carvedilol 6.25 MG tablet Commonly known as:  COREG TAKE ONE TABLET BY MOUTH TWICE DAILY WITH MEAL What changed:  See the new instructions.   clopidogrel 75 MG tablet Commonly known as:  PLAVIX TAKE ONE TABLET BY MOUTH EVERY DAY   cyanocobalamin 1000 MCG/ML injection Commonly known as:  (VITAMIN B-12) Inject 1 mL weekly into the muscle monthly   Diphenhist 25 mg capsule Generic drug:   diphenhydrAMINE Take 25 mg by mouth at bedtime.   doxycycline 100 MG tablet Commonly known as:  VIBRA-TABS Take 1 tablet (100 mg total) by mouth every 12 (twelve) hours.   escitalopram 10 MG tablet Commonly known as:  LEXAPRO TAKE ONE TABLET BY MOUTH EVERY DAY   furosemide 40 MG tablet Commonly known as:  LASIX Take 1 tablet (40 mg total) by mouth daily. 2 for weight gain > 2 lbs overnight   ipratropium-albuterol 0.5-2.5 (3) MG/3ML Soln Commonly known as:  DUONEB Take 3 mLs by nebulization every 6 (six) hours as needed.   nicotine 14 mg/24hr patch Commonly known as:  NICODERM CQ - dosed in mg/24 hours Place 1 patch (14 mg total) onto the skin daily. Start taking on:  Mar 18, 2019   nitroGLYCERIN 0.4 MG SL tablet Commonly known as:  NITROSTAT Place 1 tablet (0.4 mg total) under the tongue every 5 (five) minutes as needed for chest pain.   omeprazole 40 MG capsule Commonly known as:  PRILOSEC TAKE ONE CAPSULE BY MOUTH EVERY DAY   potassium chloride 10 MEQ tablet Commonly known as:  K-DUR Take 1 tablet (10 mEq total) by mouth daily.   predniSONE 10 MG tablet Commonly known as:  DELTASONE 4 tabs po daily for three days   sacubitril-valsartan 24-26 MG Commonly known as:  ENTRESTO Take 1 tablet by mouth 2 (two) times daily.   Tiotropium Bromide Monohydrate 2.5 MCG/ACT Aers Commonly known as:  Spiriva Respimat Inhale 18 mcg into the lungs every morning. What changed:  See the new instructions.   Ventolin HFA 108 (90 Base) MCG/ACT inhaler Generic drug:  albuterol INHALE 2 PUFFS BY MOUTH INTO THE LUNGS EVERY 6 HOURS AS NEEDED FOR WHEEZING        DISCHARGE INSTRUCTIONS:   Follow-up PMD 5 days  If you experience worsening of your admission symptoms, develop shortness of breath, life threatening emergency, suicidal or homicidal thoughts you must seek medical attention immediately by calling 911 or calling your MD immediately  if symptoms less severe.  You Must read  complete instructions/literature along with all the possible adverse reactions/side effects for all the Medicines you take and that have been prescribed to you. Take any new Medicines after you have completely understood and accept all the possible adverse reactions/side effects.   Please note  You were cared for by a hospitalist during your hospital stay. If you have any questions about your discharge medications or the care you received while you were in the hospital after you are discharged, you can call the unit and asked to speak with the hospitalist on call if the hospitalist that took care of you is not available. Once you are discharged, your primary care physician will handle any further medical issues. Please note that NO REFILLS for any discharge medications will be authorized once you are  discharged, as it is imperative that you return to your primary care physician (or establish a relationship with a primary care physician if you do not have one) for your aftercare needs so that they can reassess your need for medications and monitor your lab values.    Today   CHIEF COMPLAINT:   Chief Complaint  Patient presents with  . Shortness of Breath    HISTORY OF PRESENT ILLNESS:  Ashley [name]  is a 68 y.o. female with COPD presents with shortness of breath.   VITAL SIGNS:  Blood pressure 133/87, pulse 82, temperature 97.6 F (36.4 C), temperature source Oral, resp. rate 18, height 5\' 3"  (1.6 m), weight 78.1 kg, SpO2 93 %.   PHYSICAL EXAMINATION:  GENERAL:  68 y.o.-year-old patient lying in the bed with no acute distress.  EYES: Pupils equal, round, reactive to light and accommodation. No scleral icterus. Extraocular muscles intact.  HEENT: Head atraumatic, normocephalic. Oropharynx and nasopharynx clear.  NECK:  Supple, no jugular venous distention. No thyroid enlargement, no tenderness.  LUNGS: Decreased breath sounds bilaterally, no wheezing, rales,rhonchi or crepitation. No  use of accessory muscles of respiration.  CARDIOVASCULAR: S1, S2 normal. No murmurs, rubs, or gallops.  ABDOMEN: Soft, non-tender, non-distended. Bowel sounds present. No organomegaly or mass.  EXTREMITIES: No pedal edema, cyanosis, or clubbing.  NEUROLOGIC: Cranial nerves II through XII are intact. Muscle strength 5/5 in all extremities. Sensation intact. Gait not checked.  PSYCHIATRIC: The patient is alert and oriented x 3.  SKIN: No obvious rash, lesion, or ulcer.   DATA REVIEW:   CBC Recent Labs  Lab 03/16/19 1006  WBC 8.3  HGB 13.5  HCT 39.3  PLT 319    Chemistries  Recent Labs  Lab 03/16/19 1006 03/17/19 0417  NA 139 143  K 3.9 3.3*  CL 105 106  CO2 19* 24  GLUCOSE 352* 197*  BUN 13 25*  CREATININE 1.11* 1.27*  CALCIUM 8.4* 8.6*  AST 36  --   ALT 22  --   ALKPHOS 85  --   BILITOT 1.0  --     Cardiac Enzymes Recent Labs  Lab 03/16/19 1006  TROPONINI <0.03    Microbiology Results  Results for orders placed or performed during the hospital encounter of 03/16/19  SARS Coronavirus 2 (CEPHEID- Performed in [facility] hospital lab), Hosp Order     Status: None   Collection Time: 03/16/19 10:06 AM  Result Value Ref Range Status   SARS Coronavirus 2 NEGATIVE NEGATIVE Final    Comment: (NOTE) If result is NEGATIVE SARS-CoV-2 target nucleic acids are NOT DETECTED. The SARS-CoV-2 RNA is generally detectable in upper and lower  respiratory specimens during the acute phase of infection. The lowest  concentration of SARS-CoV-2 viral copies this assay can detect is 250  copies / mL. A negative result does not preclude SARS-CoV-2 infection  and should not be used as the sole basis for treatment or other  patient management decisions.  A negative result may occur with  improper specimen collection / handling, submission of specimen other  than nasopharyngeal swab, presence of viral mutation(s) within the  areas targeted by this assay, and inadequate number of viral  copies  (<250 copies / mL). A negative result must be combined with clinical  observations, patient history, and epidemiological information. If result is POSITIVE SARS-CoV-2 target nucleic acids are DETECTED. The SARS-CoV-2 RNA is generally detectable in upper and lower  respiratory specimens dur ing the acute phase of infection.  Positive  results are indicative of active infection with SARS-CoV-2.  Clinical  correlation with patient history and other diagnostic information is  necessary to determine patient infection status.  Positive results do  not rule out bacterial infection or co-infection with other viruses. If result is PRESUMPTIVE POSTIVE SARS-CoV-2 nucleic acids MAY BE PRESENT.   A presumptive positive result was obtained on the submitted specimen  and confirmed on repeat testing.  While 2019 novel coronavirus  (SARS-CoV-2) nucleic acids may be present in the submitted sample  additional confirmatory testing may be necessary for epidemiological  and / or clinical management purposes  to differentiate between  SARS-CoV-2 and other Sarbecovirus currently known to infect humans.  If clinically indicated additional testing with an alternate test  methodology [phone]) is advised. The SARS-CoV-2 RNA is generally  detectable in upper and lower respiratory sp ecimens during the acute  phase of infection. The expected result is Negative. Fact Sheet for Patients:  StrictlyIdeas.no Fact Sheet for Healthcare Providers: BankingDealers.co.za This test is not yet approved or cleared by the [place] FDA and has been authorized for detection and/or diagnosis of SARS-CoV-2 by FDA under an Emergency Use Authorization (EUA).  This EUA will remain in effect (meaning this test can be used) for the duration of the COVID-19 declaration under Section 564(b)(1) of the Act, 21 U.S.C. section 360bbb-3(b)(1), unless the authorization is terminated  or revoked sooner. Performed at [facility], [address] [place], [place] [address]   Blood Culture (routine x 2)     Status: None (Preliminary result)   Collection Time: 03/16/19 10:07 AM  Result Value Ref Range Status   Specimen Description BLOOD R AC  Final   Special Requests   Final    BOTTLES DRAWN AEROBIC AND ANAEROBIC Blood Culture results may not be optimal due to an excessive volume of blood received in culture bottles   Culture   Final    NO GROWTH < 24 HOURS Performed at [facility], [address] [place], [place] [address]    Report Status PENDING  Incomplete  Blood Culture (routine x 2)     Status: None (Preliminary result)   Collection Time: 03/16/19 10:26 AM  Result Value Ref Range Status   Specimen Description BLOOD R HAND  Final   Special Requests   Final    BOTTLES DRAWN AEROBIC AND ANAEROBIC Blood Culture adequate volume   Culture   Final    NO GROWTH < 24 HOURS Performed at [facility], [address] [place],  [address]    Report Status PENDING  Incomplete    RADIOLOGY:  Dg Chest Port 1 View  Result Date: 03/16/2019 CLINICAL DATA:  Short of breath congestive heart failure EXAM: PORTABLE CHEST 1 VIEW COMPARISON:  11/08/2018 FINDINGS: RIGHT-sided pacemaker. Midline sternotomy. Enlarged cardiac silhouette. Bilateral pleural effusions. Linear peripheral interstitial markings. No pneumothorax. No consolidation. LEFT clavicle fixation. IMPRESSION: Cardiomegaly, pleural effusions and interstitial pulmonary edema consistent congestive heart failure. Electronically Signed   By: Suzy Bouchard M.D.   On: 03/16/2019 10:45     Management plans discussed with the patient, and she is in agreement.  CODE STATUS:  Code Status History    Date Active Date Inactive Code Status Order ID Comments User Context   03/16/2019 5188 03/17/2019 1617 Full Code [id number]  Gorden Harms, MD Inpatient   07/17/2018 1214 07/19/2018 1548  Full Code [id number]  Dustin Flock, MD ED   10/23/2016 [phone] 10/23/2016 1630 Full Code  [id number]  Algernon Huxley, MD Inpatient   06/21/2016 0437 06/21/2016 1758 Full Code [id number]  Harrie Foreman, MD Inpatient      TOTAL TIME TAKING CARE OF THIS PATIENT: 35 minutes.    Loletha Grayer M.D on 03/17/2019 at 5:12 PM  Between 7am to 6pm - Pager - [phone]  After 6pm go to [email or web address] - password [email or web address]  [facility] Office  [phone]  CC: Primary care physician; [name], MD

## 2019-03-17 NOTE — Plan of Care (Signed)

## 2019-03-17 NOTE — Progress Notes (Signed)
*  PRELIMINARY RESULTS* Echocardiogram 2D Echocardiogram has been performed.  Ashley [name] 03/17/2019, 10:24 AM

## 2019-03-17 NOTE — Progress Notes (Signed)
Inpatient Diabetes Program Recommendations  AACE/ADA: New Consensus Statement on Inpatient Glycemic Control   Target Ranges:  Prepandial:   less than 140 mg/dL      Peak postprandial:   less than 180 mg/dL (1-2 hours)      Critically ill patients:  140 - 180 mg/dL  Results for [facility]" (MRN [id number]) as of 03/17/2019 08:45  Ref. Range 03/16/2019 17:38 03/16/2019 21:17 03/17/2019 07:23  Glucose-Capillary Latest Ref Range: 70 - 99 mg/dL 263 (H) 195 (H) 183 (H)  Results for ANTOINE, FIALLOS" (MRN [id number]) as of 03/17/2019 08:45  Ref. Range 11/08/2018 14:12 03/16/2019 14:12  Hemoglobin A1C Latest Ref Range: 4.8 - 5.6 % 5.4 5.5    Review of Glycemic Control  Diabetes history: No Outpatient Diabetes medications: NA Current orders for Inpatient glycemic control: Novolog 0-20 units TID with meals, Novolog 0-5 units QHS; Solumedrol 60 mg Q6H  Inpatient Diabetes Program Recommendations:   Insulin - Meal Coverage: If steroids are continued, please consider ordering Novolog 4 units TID with meals for meal coverage if patient eats at least 50% of meals.  Thanks, Barnie Alderman, RN, MSN, CDE Diabetes Coordinator Inpatient Diabetes Program [phone] (Team Pager from 8am to 5pm)

## 2019-03-17 NOTE — Progress Notes (Signed)
Patient given discharge instructions. Patient verbalized understanding with no questions or concerns. Education on heart failure, ex. Daily weights, fluid intake and diet gone over. Patient states she does weigh herself everyday but does not record because " its always the same". Encouraged to write it down anyway. No questions after education. Patient's family will be picking up patient. IV's taken out and tele monitor off.

## 2019-03-18 LAB — URINE CULTURE
Culture: NO GROWTH
Special Requests: NORMAL

## 2019-03-18 LAB — HIV ANTIBODY (ROUTINE TESTING W REFLEX): HIV Screen 4th Generation wRfx: NONREACTIVE

## 2019-03-19 ENCOUNTER — Telehealth: Payer: Self-pay | Admitting: Internal Medicine

## 2019-03-19 NOTE — Telephone Encounter (Signed)
Transition Care Management Follow-up Telephone Call  How have you been since you were released from the hospital? Patient was tested for COVID -19 and test was negative , DX COPD exacerbation and CHF, still feels tired and SOB , extremely exhausted.   Do you understand why you were in the hospital? yes   Do you understand the discharge instrcutions? yes  Items Reviewed:  Medications reviewed: yes  Allergies reviewed: yes  Dietary changes reviewed: yes  Referrals reviewed: yes   Functional Questionnaire:   Activities of Daily Living (ADLs):   She states they are independent in the following: ambulation, bathing and hygiene, feeding, continence, grooming, toileting and dressing States they require assistance with the following: Patient has no concerns or needs at this time   Any transportation issues/concerns?: no   Any patient concerns? no   Confirmed importance and date/time of follow-up visits scheduled: yes   Confirmed with patient if condition begins to worsen call PCP or go to the ER.  Patient was given the Call-a-Nurse line [phone]: yes

## 2019-03-19 NOTE — Telephone Encounter (Signed)
TCM call attempted no answer left voicemail top call office.

## 2019-03-21 ENCOUNTER — Ambulatory Visit (INDEPENDENT_AMBULATORY_CARE_PROVIDER_SITE_OTHER): Payer: Medicare Other | Admitting: Internal Medicine

## 2019-03-21 ENCOUNTER — Other Ambulatory Visit: Payer: Self-pay

## 2019-03-21 DIAGNOSIS — R739 Hyperglycemia, unspecified: Secondary | ICD-10-CM | POA: Diagnosis not present

## 2019-03-21 DIAGNOSIS — T50905A Adverse effect of unspecified drugs, medicaments and biological substances, initial encounter: Secondary | ICD-10-CM

## 2019-03-21 DIAGNOSIS — E876 Hypokalemia: Secondary | ICD-10-CM | POA: Diagnosis not present

## 2019-03-21 DIAGNOSIS — Z716 Tobacco abuse counseling: Secondary | ICD-10-CM

## 2019-03-21 DIAGNOSIS — Z20822 Contact with and (suspected) exposure to covid-19: Secondary | ICD-10-CM

## 2019-03-21 DIAGNOSIS — Z720 Tobacco use: Secondary | ICD-10-CM | POA: Diagnosis not present

## 2019-03-21 DIAGNOSIS — Z09 Encounter for follow-up examination after completed treatment for conditions other than malignant neoplasm: Secondary | ICD-10-CM

## 2019-03-21 LAB — CULTURE, BLOOD (ROUTINE X 2)
Culture: NO GROWTH
Culture: NO GROWTH
Special Requests: ADEQUATE

## 2019-03-21 NOTE — Progress Notes (Signed)
Telephone  Note  This visit type was conducted due to national recommendations for restrictions regarding the COVID-19 pandemic (e.g. social distancing).  This format is felt to be most appropriate for this patient at this time.  All issues noted in this document were discussed and addressed.  No physical exam was performed (except for noted visual exam findings with Video Visits).   I connected with@ on 03/21/19 at 10:00 AM EDT by  telephone and verified that I am speaking with the correct person using two identifiers. Location patient: home Location provider: home office Persons participating in the virtual visit: patient, provider  I discussed the limitations, risks, security and privacy concerns of performing an evaluation and management service by telephone and the availability of in person appointments. I also discussed with the patient that there may be a patient responsible charge related to this service. The patient expressed understanding and agreed to proceed.  Reason for visit: hospital follow up.    HPI:  Patient is a 68 yr old female with CAD, COPD, CKD who was admitted to [facility] on May 10  after developing acute hypoxic  respiratory failure.  She recalls that for she had developed progressive dyspnea  With wheezing and cough for several days prior to presenting to ED.  She is a lifelong smoker despite history of COPD and CAD s/p CABG.   Cause was COPD exacerbation and acute on chronic heart failure (BNP was 1100 on admission) ,  covid test was negative.   She was kept overnight and treated with supplemental oxygen, steroids, antibiotics and nebulized bronchodilators .  Dc 'd home on steroids and doxycycline on May 11.  States that she gained 11 lbs during hospitalization and so she has been doubling her dose of lasix since discharge .  Today is the first day she felt back to normal.  All labs, imaging studies, noes reviewed with patient.  Hypokalemia addressed. She has not used tobacco  since admission   Had several episodes of  diarrhea,  Started taking probiotic and diarrhea has improved.   Still on doxy,  finished prednisone yesterday.  Potassium was low on May 11,  Has 10 meq potassium at home  Advised to take 40 meq daiy for 2 days,  Then resume 10 meq daily      ROS: See pertinent positives and negatives per HPI.  Past Medical History:  Diagnosis Date  . AICD (automatic cardioverter/defibrillator) present    on right side  . Anemia   . CAD (coronary artery disease)    s/p CABG  . CHF (congestive heart failure) ([place])   . Chronic kidney disease    renal artery stenosis  . Colon cancer ([place])   . COPD (chronic obstructive pulmonary disease) ([place])   . Depression   . Dyspnea   . GERD (gastroesophageal reflux disease)   . Hx of colonic polyps   . Hyperlipidemia   . Hypertension   . Mitral valve disorder    s/p mitral valve repair wth CABG  . Myocardial infarction ([place])   . Peripheral vascular disease ([place])   . Presence of permanent cardiac pacemaker    Pacemaker/ Defibrillator    Past Surgical History:  Procedure Laterality Date  . arm surgery     fracture, has plates and screws  . CABG with mitral valve repair    . CARDIAC CATHETERIZATION    . COLON SURGERY     colon cancer  . COLONOSCOPY WITH PROPOFOL N/A 10/09/2016   Procedure:  COLONOSCOPY WITH PROPOFOL;  Surgeon: Jonathon Bellows, MD;  Location: [facility] ENDOSCOPY;  Service: Endoscopy;  Laterality: N/A;  . COLONOSCOPY WITH PROPOFOL N/A 07/24/2018   Procedure: COLONOSCOPY WITH PROPOFOL;  Surgeon: Virgel Manifold, MD;  Location: [facility] ENDOSCOPY;  Service: Endoscopy;  Laterality: N/A;  . CORONARY ARTERY BYPASS GRAFT    . ENTEROSCOPY N/A 11/21/2018   Procedure: ENTEROSCOPY-BALLOON;  Surgeon: Jonathon Bellows, MD;  Location: [facility] ENDOSCOPY;  Service: Gastroenterology;  Laterality: N/A;  . ESOPHAGOGASTRODUODENOSCOPY (EGD) WITH PROPOFOL N/A 07/24/2018   Procedure: ESOPHAGOGASTRODUODENOSCOPY (EGD) WITH PROPOFOL;   Surgeon: Virgel Manifold, MD;  Location: [facility] ENDOSCOPY;  Service: Endoscopy;  Laterality: N/A;  . GIVENS CAPSULE STUDY N/A 08/16/2018   Procedure: GIVENS CAPSULE STUDY;  Surgeon: Virgel Manifold, MD;  Location: [facility] ENDOSCOPY;  Service: Endoscopy;  Laterality: N/A;  . pace maker defib  2016  . PERIPHERAL VASCULAR CATHETERIZATION N/A 10/23/2016   Procedure: Renal Angiography;  Surgeon: Algernon Huxley, MD;  Location: [place] CV LAB;  Service: Cardiovascular;  Laterality: N/A;  . TOTAL ABDOMINAL HYSTERECTOMY  1999   history of abnormal pap    Family History  Problem Relation Age of Onset  . Arthritis Mother   . Cancer Mother        uterus cancer  . Hyperlipidemia Mother   . Hypertension Mother   . Heart disease Mother   . Diabetes Mother   . Hyperlipidemia Father   . Hypertension Father   . Heart disease Father   . Diabetes Father   . Cancer Sister        ovary cancer  . Diabetes Maternal Grandmother   . Hypertension Maternal Grandmother   . Arthritis Maternal Grandmother   . Hypertension Maternal Grandfather   . Hypertension Paternal Grandmother   . Hypertension Paternal Grandfather   . Heart disease Paternal Grandfather   . Breast cancer Neg Hx     SOCIAL HX: married,  Tobacco user   Current Outpatient Medications:  .  acetaminophen (TYLENOL) 500 MG tablet, Maximum 6 tablets a day. (Patient taking differently: Take 500 mg by mouth every 4 (four) hours as needed for fever. Maximum 6 tablets a day.), Disp: 42 tablet, Rfl: 0 .  ADVAIR DISKUS 250-50 MCG/DOSE AEPB, INHALE 1 PUFF BY MOUTH INTO THE LUNGS EVERY 12 HOURS RINSE MOUTH AFTER EACH USE, Disp: 180 each, Rfl: 1 .  ALPRAZolam (XANAX) 0.5 MG tablet, Take 0.5-1 tablets (0.25-0.5 mg total) by mouth at bedtime. TAKE ONE TABLET BY MOUTH AT BEDTIME AS NEEDED FOR ANXIETY, Disp: 30 tablet, Rfl: 3 .  aspirin 81 MG tablet, Take 81 mg by mouth daily., Disp: , Rfl:  .  atorvastatin (LIPITOR) 40 MG tablet, TAKE ONE TABLET  BY MOUTH EVERY DAY (Patient taking differently: Take 40 mg by mouth daily at 6 PM. ), Disp: 90 tablet, Rfl: 1 .  carvedilol (COREG) 6.25 MG tablet, TAKE ONE TABLET BY MOUTH TWICE DAILY WITH MEAL (Patient taking differently: Take 6.25 mg by mouth 2 (two) times daily with a meal. ), Disp: 180 tablet, Rfl: 1 .  clopidogrel (PLAVIX) 75 MG tablet, TAKE ONE TABLET BY MOUTH EVERY DAY, Disp: 90 tablet, Rfl: 1 .  cyanocobalamin (,VITAMIN B-12,) 1000 MCG/ML injection, Inject 1 mL weekly into the muscle monthly, Disp: 10 mL, Rfl: 0 .  diphenhydrAMINE (DIPHENHIST) 25 mg capsule, Take 25 mg by mouth at bedtime., Disp: , Rfl:  .  doxycycline (VIBRA-TABS) 100 MG tablet, Take 1 tablet (100 mg total) by mouth every 12 (twelve)  hours., Disp: 12 tablet, Rfl: 0 .  escitalopram (LEXAPRO) 10 MG tablet, TAKE ONE TABLET BY MOUTH EVERY DAY, Disp: 90 tablet, Rfl: 1 .  furosemide (LASIX) 40 MG tablet, Take 1 tablet (40 mg total) by mouth daily. 2 for weight gain > 2 lbs overnight, Disp: 135 tablet, Rfl: 1 .  ipratropium-albuterol (DUONEB) 0.5-2.5 (3) MG/3ML SOLN, Take 3 mLs by nebulization every 6 (six) hours as needed., Disp: 360 mL, Rfl: 1 .  nitroGLYCERIN (NITROSTAT) 0.4 MG SL tablet, Place 1 tablet (0.4 mg total) under the tongue every 5 (five) minutes as needed for chest pain., Disp: 30 tablet, Rfl: 3 .  omeprazole (PRILOSEC) 40 MG capsule, TAKE ONE CAPSULE BY MOUTH EVERY DAY, Disp: 90 capsule, Rfl: 1 .  potassium chloride (K-DUR) 10 MEQ tablet, Take 1 tablet (10 mEq total) by mouth daily., Disp: 90 tablet, Rfl: 1 .  sacubitril-valsartan (ENTRESTO) 24-26 MG, Take 1 tablet by mouth 2 (two) times daily., Disp: 60 tablet, Rfl: 3 .  Tiotropium Bromide Monohydrate (SPIRIVA RESPIMAT) 2.5 MCG/ACT AERS, Inhale 18 mcg into the lungs every morning., Disp: 4 g, Rfl: 0 .  VENTOLIN HFA 108 (90 Base) MCG/ACT inhaler, INHALE 2 PUFFS BY MOUTH INTO THE LUNGS EVERY 6 HOURS AS NEEDED FOR WHEEZING, Disp: 18 g, Rfl: 2  EXAM:   General  impression: alert, cooperative and articulate.  No signs of being in distress  Lungs: speech is fluent sentence length suggests that patient is not short of breath and not punctuated by cough, sneezing or sniffing. [name]   Psych: affect normal.  speech is articulate and non pressured .  Denies suicidal thoughts   ASSESSMENT AND PLAN:  Discussed the following assessment and plan:  Tobacco abuse counseling  Tobacco abuse  Hypokalemia  Hospital discharge follow-up  Hyperglycemia, drug-induced  Covid-19 Virus not Detected  Tobacco abuse counseling ongoing despite progressive CAD prior stent placement in 2015 and CABG in 2019,  PAD  and renal artery stenosis.    Tobacco abuse She has not smoked since her last admission May 10 for acute respiratory failure. .   Hypokalemia Secondary to diuretic use.  Advised to increase potassium supplementation to 40 meq for 2 days,  Then resume 10 meq daily   Hospital discharge follow-up Patient is stable post discharge and has no new issues or questions about discharge plans at the visit today for hospital follow up. All labs , imaging studies and progress notes from admission were reviewed with patient today    Hyperglycemia, drug-induced Secondary to steroids. a1c was normal.   Lab Results  Component Value Date   HGBA1C 5.5 03/16/2019     Covid-19 Virus not Detected Testing via n/p swab was negative during recent admission for respiratory failure     I discussed the assessment and treatment plan with the patient. The patient was provided an opportunity to ask questions and all were answered. The patient agreed with the plan and demonstrated an understanding of the instructions.   The patient was advised to call back or seek an in-person evaluation if the symptoms worsen or if the condition fails to improve as anticipated.  I provided 25 minutes of non-face-to-face time during this encounter.   [name], MD

## 2019-03-21 NOTE — Progress Notes (Signed)
Pt went to the hospital on 03/16/2019 with [facility]. Pt was tested for covid-19 and the results came back negative.

## 2019-03-23 ENCOUNTER — Encounter: Payer: Self-pay | Admitting: Internal Medicine

## 2019-03-23 DIAGNOSIS — R739 Hyperglycemia, unspecified: Secondary | ICD-10-CM | POA: Insufficient documentation

## 2019-03-23 DIAGNOSIS — Z20822 Contact with and (suspected) exposure to covid-19: Secondary | ICD-10-CM | POA: Insufficient documentation

## 2019-03-23 DIAGNOSIS — T50905A Adverse effect of unspecified drugs, medicaments and biological substances, initial encounter: Secondary | ICD-10-CM | POA: Insufficient documentation

## 2019-03-23 DIAGNOSIS — E876 Hypokalemia: Secondary | ICD-10-CM | POA: Insufficient documentation

## 2019-03-23 NOTE — Assessment & Plan Note (Signed)
Secondary to diuretic use.  Advised to increase potassium supplementation to 40 meq for 2 days,  Then resume 10 meq daily

## 2019-03-23 NOTE — Assessment & Plan Note (Signed)
Secondary to steroids. a1c was normal.   Lab Results  Component Value Date   HGBA1C 5.5 03/16/2019

## 2019-03-23 NOTE — Assessment & Plan Note (Signed)
Patient is stable post discharge and has no new issues or questions about discharge plans at the visit today for hospital follow up. All labs , imaging studies and progress notes from admission were reviewed with patient today   

## 2019-03-23 NOTE — Assessment & Plan Note (Signed)
Testing via n/p swab was negative during recent admission for respiratory failure

## 2019-03-23 NOTE — Assessment & Plan Note (Signed)
She has not smoked since her last admission May 10 for acute respiratory failure. [name]

## 2019-03-23 NOTE — Assessment & Plan Note (Addendum)
ongoing despite progressive CAD prior stent placement in 2015 and CABG in 2019,  PAD  and renal artery stenosis.

## 2019-03-24 ENCOUNTER — Encounter: Payer: Self-pay | Admitting: Family

## 2019-03-24 ENCOUNTER — Ambulatory Visit: Payer: Medicare Other | Attending: Family | Admitting: Family

## 2019-03-24 ENCOUNTER — Telehealth: Payer: Self-pay

## 2019-03-24 ENCOUNTER — Other Ambulatory Visit: Payer: Self-pay

## 2019-03-24 VITALS — Wt 170.0 lb

## 2019-03-24 DIAGNOSIS — I5032 Chronic diastolic (congestive) heart failure: Secondary | ICD-10-CM

## 2019-03-24 DIAGNOSIS — Z720 Tobacco use: Secondary | ICD-10-CM

## 2019-03-24 DIAGNOSIS — I10 Essential (primary) hypertension: Secondary | ICD-10-CM

## 2019-03-24 NOTE — Telephone Encounter (Signed)
TELEPHONE CALL NOTE  MARTESHA NIEDERMEIER has been deemed a candidate for a follow-up tele-health visit to limit community exposure during the Covid-19 pandemic. I spoke with the patient via phone to ensure availability of phone/video source, confirm preferred email & phone number, discuss instructions and expectations, and review consent.   I reminded JATOYA ARMBRISTER to be prepared with any vital sign and/or heart rhythm information that could potentially be obtained via home monitoring, at the time of her visit.  Finally, I reminded PAMMY VESEY to expect an e-mail containing a link for their video-based visit approximately 15 minutes before her visit, or alternatively, a phone call at the time of her visit if her visit is planned to be a phone encounter.  Did the patient verbally consent to treatment as below? YES  Gaylord Shih, CMA 03/24/2019 8:35 AM  CONSENT FOR TELE-HEALTH VISIT - PLEASE REVIEW  I hereby voluntarily request, consent and authorize [facility] and its employed or contracted physicians, physician assistants, nurse practitioners or other licensed health care professionals (the Practitioner), to provide me with telemedicine health care services (the "Services") as deemed necessary by the treating Practitioner. I acknowledge and consent to receive the Services by the Practitioner via telemedicine. I understand that the telemedicine visit will involve communicating with the Practitioner through telephonic communication technology and the disclosure of certain medical information by electronic transmission. I acknowledge that I have been given the opportunity to request an in-person assessment or other available alternative prior to the telemedicine visit and am voluntarily participating in the telemedicine visit.  I understand that I have the right to withhold or withdraw my consent to the use of telemedicine in the course of my care at any time, without  affecting my right to future care or treatment, and that the Practitioner or I may terminate the telemedicine visit at any time. I understand that I have the right to inspect all information obtained and/or recorded in the course of the telemedicine visit and may receive copies of available information for a reasonable fee.  I understand that some of the potential risks of receiving the Services via telemedicine include:  [name] Delay or interruption in medical evaluation due to technological equipment failure or disruption; . Information transmitted may not be sufficient (e.g. poor resolution of images) to allow for appropriate medical decision making by the Practitioner; and/or  . In rare instances, security protocols could fail, causing a breach of personal health information.  Furthermore, I acknowledge that it is my responsibility to provide information about my medical history, conditions and care that is complete and accurate to the best of my ability. I acknowledge that Practitioner's advice, recommendations, and/or decision may be based on factors not within their control, such as incomplete or inaccurate data provided by me or lack of visual representation. I understand that the practice of medicine is not an exact science and that Practitioner makes no warranties or guarantees regarding treatment outcomes. I acknowledge that I will receive a copy of this consent concurrently upon execution via email to the email address I last provided but may also request a printed copy by calling the office of [facility].    I understand that my insurance may be billed for this visit.   I have read or had this consent read to me. . I understand the contents of this consent, which adequately explains the benefits and risks of the Services being provided via telemedicine.  [name]  I have been provided ample opportunity to ask questions regarding this consent and the Services and have had my questions  answered to my satisfaction. . I give my informed consent for the services to be provided through the use of telemedicine in my medical care  By participating in this telemedicine visit I agree to the above.

## 2019-03-24 NOTE — Patient Instructions (Signed)
Continue weighing daily and call for an overnight weight gain of > 2 pounds or a weekly weight gain of >5 pounds.    Smoking Cessation Quitting smoking is important to your health and has many advantages. However, it is not always easy to quit since nicotine is a very addictive drug. Oftentimes, people try 3 times or more before being able to quit. This document explains the best ways for you to prepare to quit smoking. Quitting takes hard work and a lot of effort, but you can do it. ADVANTAGES OF QUITTING SMOKING  You will live longer, feel better, and live better.  Your body will feel the impact of quitting smoking almost immediately.  Within 20 minutes, blood pressure decreases. Your pulse returns to its normal level.  After 8 hours, carbon monoxide levels in the blood return to normal. Your oxygen level increases.  After 24 hours, the chance of having a heart attack starts to decrease. Your breath, hair, and body stop smelling like smoke.  After 48 hours, damaged nerve endings begin to recover. Your sense of taste and smell improve.  After 72 hours, the body is virtually free of nicotine. Your bronchial tubes relax and breathing becomes easier.  After 2 to 12 weeks, lungs can hold more air. Exercise becomes easier and circulation improves.  The risk of having a heart attack, stroke, cancer, or lung disease is greatly reduced.  After 1 year, the risk of coronary heart disease is cut in half.  After 5 years, the risk of stroke falls to the same as a nonsmoker.  After 10 years, the risk of lung cancer is cut in half and the risk of other cancers decreases significantly.  After 15 years, the risk of coronary heart disease drops, usually to the level of a nonsmoker.  If you are pregnant, quitting smoking will improve your chances of having a healthy baby.  The people you live with, especially any children, will be healthier.  You will have extra money to spend on things other  than cigarettes. QUESTIONS TO THINK ABOUT BEFORE ATTEMPTING TO QUIT You may want to talk about your answers with your health care provider.  Why do you want to quit?  If you tried to quit in the past, what helped and what did not?  What will be the most difficult situations for you after you quit? How will you plan to handle them?  Who can help you through the tough times? Your family? Friends? A health care provider?  What pleasures do you get from smoking? What ways can you still get pleasure if you quit? Here are some questions to ask your health care provider:  How can you help me to be successful at quitting?  What medicine do you think would be best for me and how should I take it?  What should I do if I need more help?  What is smoking withdrawal like? How can I get information on withdrawal? GET READY  Set a quit date.  Change your environment by getting rid of all cigarettes, ashtrays, matches, and lighters in your home, car, or work. Do not let people smoke in your home.  Review your past attempts to quit. Think about what worked and what did not. GET SUPPORT AND ENCOURAGEMENT You have a better chance of being successful if you have help. You can get support in many ways.  Tell your family, friends, and coworkers that you are going to quit and need their support. Ask   them not to smoke around you.  Get individual, group, or telephone counseling and support. Programs are available at local hospitals and health centers. Call your local health department for information about programs in your area.  Spiritual beliefs and practices may help some smokers quit.  Download a "quit meter" on your computer to keep track of quit statistics, such as how long you have gone without smoking, cigarettes not smoked, and money saved.  Get a self-help book about quitting smoking and staying off tobacco. LEARN NEW SKILLS AND BEHAVIORS  Distract yourself from urges to smoke. Talk to  someone, go for a walk, or occupy your time with a task.  Change your normal routine. Take a different route to work. Drink tea instead of coffee. Eat breakfast in a different place.  Reduce your stress. Take a hot bath, exercise, or read a book.  Plan something enjoyable to do every day. Reward yourself for not smoking.  Explore interactive web-based programs that specialize in helping you quit. GET MEDICINE AND USE IT CORRECTLY Medicines can help you stop smoking and decrease the urge to smoke. Combining medicine with the above behavioral methods and support can greatly increase your chances of successfully quitting smoking.  Nicotine replacement therapy helps deliver nicotine to your body without the negative effects and risks of smoking. Nicotine replacement therapy includes nicotine gum, lozenges, inhalers, nasal sprays, and skin patches. Some may be available over-the-counter and others require a prescription.  Antidepressant medicine helps people abstain from smoking, but how this works is unknown. This medicine is available by prescription.  Nicotinic receptor partial agonist medicine simulates the effect of nicotine in your brain. This medicine is available by prescription. Ask your health care provider for advice about which medicines to use and how to use them based on your health history. Your health care provider will tell you what side effects to look out for if you choose to be on a medicine or therapy. Carefully read the information on the package. Do not use any other product containing nicotine while using a nicotine replacement product.  RELAPSE OR DIFFICULT SITUATIONS Most relapses occur within the first 3 months after quitting. Do not be discouraged if you start smoking again. Remember, most people try several times before finally quitting. You may have symptoms of withdrawal because your body is used to nicotine. You may crave cigarettes, be irritable, feel very hungry, cough  often, get headaches, or have difficulty concentrating. The withdrawal symptoms are only temporary. They are strongest when you first quit, but they will go away within 10-14 days. To reduce the chances of relapse, try to:  Avoid drinking alcohol. Drinking lowers your chances of successfully quitting.  Reduce the amount of caffeine you consume. Once you quit smoking, the amount of caffeine in your body increases and can give you symptoms, such as a rapid heartbeat, sweating, and anxiety.  Avoid smokers because they can make you want to smoke.  Do not let weight gain distract you. Many smokers will gain weight when they quit, usually less than 10 pounds. Eat a healthy diet and stay active. You can always lose the weight gained after you quit.  Find ways to improve your mood other than smoking. FOR MORE INFORMATION  www.smokefree.gov  Document Released: 10/17/2001 Document Revised: 03/09/2014 Document Reviewed: 02/01/2012 [employer] Patient Information 2015 [employer]. This information is not intended to replace advice given to you by your health care provider. Make sure you discuss any questions you have with your   health care provider.  

## 2019-03-24 NOTE — Progress Notes (Signed)
Virtual Visit via Telephone Note    Evaluation Performed:  Follow-up visit  This visit type was conducted due to national recommendations for restrictions regarding the COVID-19 Pandemic (e.g. social distancing).  This format is felt to be most appropriate for this patient at this time.  All issues noted in this document were discussed and addressed.  No physical exam was performed (except for noted visual exam findings with Video Visits).  Please refer to the patient's chart (MyChart message for video visits and phone note for telephone visits) for the patient's consent to telehealth for [facility]  Date:  03/24/2019   ID:  Ashley [name], DOB Feb 22, 1951, MRN [id number]  Patient Location:  [address] [facility] [place] [address]   Provider location:   [facility] [place] [address] [place],  [address]  PCP:  [name], MD  Cardiologist:  Isaias Cowman, MD Electrophysiologist:  None   Chief Complaint:  Shortness of breath  History of Present Illness:    Ashley [name] is a 68 y.o. female who presents via audio/video conferencing for a telehealth visit today.  Patient verified DOB and address.  The patient does not have symptoms concerning for COVID-19 infection (fever, chills, cough, or new SHORTNESS OF BREATH).   Patient reports minimal shortness of breath upon moderate exertion. She describes this as chronic in nature having been present for several years. She has associated dizziness with sudden position changes, palpitations at time, cough and fatigue along with this. She denies any swelling in her legs/ abdomen, chest pain, difficulty sleeping or weight gain.   Prior CV studies:   The following studies were reviewed today:  Echo report from 03/17/2019 reviewed and showed an EF of 50-55% along with mild/moderate MR, mild MS and trivial AR.   Past Medical History:  Diagnosis Date  . AICD (automatic  cardioverter/defibrillator) present    on right side  . Anemia   . CAD (coronary artery disease)    s/p CABG  . CHF (congestive heart failure) ([place])   . Chronic kidney disease    renal artery stenosis  . Colon cancer ([place])   . COPD (chronic obstructive pulmonary disease) ([place])   . Depression   . Dyspnea   . GERD (gastroesophageal reflux disease)   . Hx of colonic polyps   . Hyperlipidemia   . Hypertension   . Mitral valve disorder    s/p mitral valve repair wth CABG  . Myocardial infarction ([place])   . Peripheral vascular disease ([place])   . Presence of permanent cardiac pacemaker    Pacemaker/ Defibrillator   Past Surgical History:  Procedure Laterality Date  . arm surgery     fracture, has plates and screws  . CABG with mitral valve repair    . CARDIAC CATHETERIZATION    . COLON SURGERY     colon cancer  . COLONOSCOPY WITH PROPOFOL N/A 10/09/2016   Procedure: COLONOSCOPY WITH PROPOFOL;  Surgeon: Jonathon Bellows, MD;  Location: [facility] ENDOSCOPY;  Service: Endoscopy;  Laterality: N/A;  . COLONOSCOPY WITH PROPOFOL N/A 07/24/2018   Procedure: COLONOSCOPY WITH PROPOFOL;  Surgeon: Virgel Manifold, MD;  Location: [facility] ENDOSCOPY;  Service: Endoscopy;  Laterality: N/A;  . CORONARY ARTERY BYPASS GRAFT    . ENTEROSCOPY N/A 11/21/2018   Procedure: ENTEROSCOPY-BALLOON;  Surgeon: Jonathon Bellows, MD;  Location: [facility] ENDOSCOPY;  Service: Gastroenterology;  Laterality: N/A;  . ESOPHAGOGASTRODUODENOSCOPY (EGD) WITH PROPOFOL N/A 07/24/2018   Procedure: ESOPHAGOGASTRODUODENOSCOPY (  EGD) WITH PROPOFOL;  Surgeon: Virgel Manifold, MD;  Location: [facility] ENDOSCOPY;  Service: Endoscopy;  Laterality: N/A;  . GIVENS CAPSULE STUDY N/A 08/16/2018   Procedure: GIVENS CAPSULE STUDY;  Surgeon: Virgel Manifold, MD;  Location: [facility] ENDOSCOPY;  Service: Endoscopy;  Laterality: N/A;  . pace maker defib  2016  . PERIPHERAL VASCULAR CATHETERIZATION N/A 10/23/2016   Procedure: Renal Angiography;  Surgeon: Algernon Huxley, MD;  Location: [place] [facility];  Service: Cardiovascular;  Laterality: N/A;  . TOTAL ABDOMINAL HYSTERECTOMY  1999   history of abnormal pap     Current Meds  Medication Sig  . acetaminophen (TYLENOL) 500 MG tablet Maximum 6 tablets a day. (Patient taking differently: Take 500 mg by mouth every 4 (four) hours as needed for fever. Maximum 6 tablets a day.)  . ADVAIR DISKUS 250-50 MCG/DOSE AEPB INHALE 1 PUFF BY MOUTH INTO THE LUNGS EVERY 12 HOURS RINSE MOUTH AFTER EACH USE  . ALPRAZolam (XANAX) 0.5 MG tablet Take 0.5-1 tablets (0.25-0.5 mg total) by mouth at bedtime. TAKE ONE TABLET BY MOUTH AT BEDTIME AS NEEDED FOR ANXIETY  . aspirin 81 MG tablet Take 81 mg by mouth daily.  [name] atorvastatin (LIPITOR) 40 MG tablet TAKE ONE TABLET BY MOUTH EVERY DAY (Patient taking differently: Take 40 mg by mouth daily at 6 PM. )  . carvedilol (COREG) 6.25 MG tablet TAKE ONE TABLET BY MOUTH TWICE DAILY WITH MEAL (Patient taking differently: Take 6.25 mg by mouth 2 (two) times daily with a meal. )  . clopidogrel (PLAVIX) 75 MG tablet TAKE ONE TABLET BY MOUTH EVERY DAY  . cyanocobalamin (,VITAMIN B-12,) 1000 MCG/ML injection Inject 1 mL weekly into the muscle monthly  . diphenhydrAMINE (DIPHENHIST) 25 mg capsule Take 25 mg by mouth at bedtime.  [name] escitalopram (LEXAPRO) 10 MG tablet TAKE ONE TABLET BY MOUTH EVERY DAY  . ferrous sulfate 325 (65 FE) MG EC tablet Take 325 mg by mouth daily with breakfast.  . furosemide (LASIX) 40 MG tablet Take 1 tablet (40 mg total) by mouth daily. 2 for weight gain > 2 lbs overnight  . ipratropium-albuterol (DUONEB) 0.5-2.5 (3) MG/3ML SOLN Take 3 mLs by nebulization every 6 (six) hours as needed.  . nitroGLYCERIN (NITROSTAT) 0.4 MG SL tablet Place 1 tablet (0.4 mg total) under the tongue every 5 (five) minutes as needed for chest pain.  [name] omeprazole (PRILOSEC) 40 MG capsule TAKE ONE CAPSULE BY MOUTH EVERY DAY  . potassium chloride (K-DUR) 10 MEQ tablet Take 1 tablet (10 mEq  total) by mouth daily.  . sacubitril-valsartan (ENTRESTO) 24-26 MG Take 1 tablet by mouth 2 (two) times daily.  . Tiotropium Bromide Monohydrate (SPIRIVA RESPIMAT) 2.5 MCG/ACT AERS Inhale 18 mcg into the lungs every morning.  . VENTOLIN HFA 108 (90 Base) MCG/ACT inhaler INHALE 2 PUFFS BY MOUTH INTO THE LUNGS EVERY 6 HOURS AS NEEDED FOR WHEEZING     Allergies:   Hydrocodone-acetaminophen   Social History   Tobacco Use  . Smoking status: Former Smoker    Packs/day: 0.50    Types: Cigarettes    Last attempt to quit: 03/16/2019    Years since quitting: 0.0  . Smokeless tobacco: Never Used  . Tobacco comment: 1/2-1 ppd  Substance Use Topics  . Alcohol use: Yes    Alcohol/week: 2.0 standard drinks    Types: 2 Shots of liquor per week  . Drug use: No     Family Hx: The patient's family history includes Arthritis in  her maternal grandmother and mother; Cancer in her mother and sister; Diabetes in her father, maternal grandmother, and mother; Heart disease in her father, mother, and paternal grandfather; Hyperlipidemia in her father and mother; Hypertension in her father, maternal grandfather, maternal grandmother, mother, paternal grandfather, and paternal grandmother. There is no history of Breast cancer.  ROS:   Please see the history of present illness.     All other systems reviewed and are negative.   Labs/Other Tests and Data Reviewed:    Recent Labs: 07/17/2018: TSH 1.791 11/08/2018: Magnesium 1.9 03/16/2019: ALT 22; B Natriuretic Peptide 1,287.0; Hemoglobin 13.5; Platelets 319 03/17/2019: BUN 25; Creatinine, Ser 1.27; Potassium 3.3; Sodium 143   Recent Lipid Panel Lab Results  Component Value Date/Time   CHOL 148 11/08/2018 02:12 PM   CHOL 218 (H) 05/30/2013 12:38 AM   TRIG (H) 11/08/2018 02:12 PM    550.0 Triglyceride is over 400; calculations on Lipids are invalid.   TRIG 381 (H) 05/30/2013 12:38 AM   HDL 39.60 11/08/2018 02:12 PM   HDL 35 (L) 05/30/2013 12:38 AM    CHOLHDL 4 11/08/2018 02:12 PM   LDLCALC 139 (H) 05/06/2018 11:07 AM   LDLCALC 107 (H) 05/30/2013 12:38 AM   LDLDIRECT 62.0 11/08/2018 02:12 PM    Wt Readings from Last 3 Encounters:  03/24/19 170 lb (77.1 kg)  03/17/19 172 lb 2.9 oz (78.1 kg)  12/20/18 171 lb 8 oz (77.8 kg)     Exam:    Vital Signs:  Wt 170 lb (77.1 kg) Comment: self-reported  BMI 30.11 kg/m    Well nourished, well developed female in no  acute distress.   ASSESSMENT & PLAN:    1. Chronic heart failure with preserved ejection fraction- - NYHA class II - euvolemic based on patient's description of symptoms - weighing daily and says that her weight has been stable; reminded her to call for an overnight weight gain of >2 pounds or a weekly weight gain of >5 pounds - not adding salt and is trying to read food labels so that she can closely follow a low sodium diet - saw cardiology ([place]) 11/11/2018 - BNP 03/16/2019 was 1287.0  2: HTN- - not checking BP at home - had telemedicine visit with PCP Derrel Nip) 03/21/2019 - BMP 03/17/2019 reviewed and showed sodium 143, potassium 3.3, creatinine 1.27 and GFR 44  3: Tobacco use-   - smoking 6 cigarettes daily - complete cessation discussed for 2 minutes with her  COVID-19 Education: The signs and symptoms of COVID-19 were discussed with the patient and how to seek care for testing (follow up with PCP or arrange E-visit).  The importance of social distancing was discussed today.  Patient Risk:   After full review of this patients clinical status, I feel that they are at least moderate risk at this time.  Time:   Today, I have spent 9 minutes with the patient with telehealth technology discussing medications, weight and symptoms to report.     Medication Adjustments/Labs and Tests Ordered: Current medicines are reviewed at length with the patient today.  Concerns regarding medicines are outlined above.   Tests Ordered: No orders of the defined types were placed  in this encounter.  Medication Changes: No orders of the defined types were placed in this encounter.   Disposition:  Follow-up in 3 months or sooner for any questions/problems before then.   Signed, Alisa Graff, FNP  03/24/2019 12:33 PM    [facility]

## 2019-03-24 NOTE — Telephone Encounter (Signed)
   TELEPHONE CALL NOTE  This patient has been deemed a candidate for follow-up tele-health visit to limit community exposure during the Covid-19 pandemic. I spoke with the patient via phone to discuss instructions. The patient was advised to review the section on consent for treatment as well. The patient will receive a phone call 2-3 days prior to their E-Visit at which time consent will be verbally confirmed. A Virtual Office Visit appointment type has been scheduled for 03/24/2019 with [facility].  Gaylord Shih, [place] 03/24/2019 8:34 AM

## 2019-04-15 ENCOUNTER — Ambulatory Visit: Payer: Medicare Other | Admitting: Family

## 2019-04-16 ENCOUNTER — Other Ambulatory Visit: Payer: Self-pay | Admitting: Family

## 2019-05-06 ENCOUNTER — Other Ambulatory Visit: Payer: Self-pay | Admitting: Internal Medicine

## 2019-05-06 DIAGNOSIS — E7800 Pure hypercholesterolemia, unspecified: Secondary | ICD-10-CM

## 2019-05-07 ENCOUNTER — Other Ambulatory Visit: Payer: Self-pay | Admitting: Family

## 2019-05-07 ENCOUNTER — Ambulatory Visit (INDEPENDENT_AMBULATORY_CARE_PROVIDER_SITE_OTHER): Payer: Medicare Other

## 2019-05-07 ENCOUNTER — Other Ambulatory Visit: Payer: Self-pay

## 2019-05-07 ENCOUNTER — Encounter: Payer: Self-pay | Admitting: Internal Medicine

## 2019-05-07 ENCOUNTER — Ambulatory Visit (INDEPENDENT_AMBULATORY_CARE_PROVIDER_SITE_OTHER): Payer: Medicare Other | Admitting: Internal Medicine

## 2019-05-07 VITALS — Wt 168.0 lb

## 2019-05-07 DIAGNOSIS — D500 Iron deficiency anemia secondary to blood loss (chronic): Secondary | ICD-10-CM | POA: Diagnosis not present

## 2019-05-07 DIAGNOSIS — T50905A Adverse effect of unspecified drugs, medicaments and biological substances, initial encounter: Secondary | ICD-10-CM | POA: Diagnosis not present

## 2019-05-07 DIAGNOSIS — F325 Major depressive disorder, single episode, in full remission: Secondary | ICD-10-CM

## 2019-05-07 DIAGNOSIS — R05 Cough: Secondary | ICD-10-CM

## 2019-05-07 DIAGNOSIS — R739 Hyperglycemia, unspecified: Secondary | ICD-10-CM

## 2019-05-07 DIAGNOSIS — J439 Emphysema, unspecified: Secondary | ICD-10-CM | POA: Diagnosis not present

## 2019-05-07 DIAGNOSIS — Z716 Tobacco abuse counseling: Secondary | ICD-10-CM | POA: Diagnosis not present

## 2019-05-07 DIAGNOSIS — F5104 Psychophysiologic insomnia: Secondary | ICD-10-CM | POA: Diagnosis not present

## 2019-05-07 DIAGNOSIS — I10 Essential (primary) hypertension: Secondary | ICD-10-CM | POA: Diagnosis not present

## 2019-05-07 DIAGNOSIS — G4700 Insomnia, unspecified: Secondary | ICD-10-CM | POA: Insufficient documentation

## 2019-05-07 DIAGNOSIS — E876 Hypokalemia: Secondary | ICD-10-CM

## 2019-05-07 DIAGNOSIS — D519 Vitamin B12 deficiency anemia, unspecified: Secondary | ICD-10-CM

## 2019-05-07 DIAGNOSIS — E7800 Pure hypercholesterolemia, unspecified: Secondary | ICD-10-CM | POA: Diagnosis not present

## 2019-05-07 DIAGNOSIS — I25700 Atherosclerosis of coronary artery bypass graft(s), unspecified, with unstable angina pectoris: Secondary | ICD-10-CM

## 2019-05-07 DIAGNOSIS — Z0000 Encounter for general adult medical examination without abnormal findings: Secondary | ICD-10-CM | POA: Diagnosis not present

## 2019-05-07 DIAGNOSIS — R059 Cough, unspecified: Secondary | ICD-10-CM

## 2019-05-07 MED ORDER — CARVEDILOL 6.25 MG PO TABS: ORAL_TABLET | ORAL | 1 refills | Status: DC

## 2019-05-07 MED ORDER — ENTRESTO 24-26 MG PO TABS: 1.0000 | ORAL_TABLET | Freq: Two times a day (BID) | ORAL | 3 refills | Status: DC

## 2019-05-07 MED ORDER — SPIRIVA RESPIMAT 2.5 MCG/ACT IN AERS: 5.0000 ug | INHALATION_SPRAY | Freq: Every morning | RESPIRATORY_TRACT | 11 refills | Status: DC

## 2019-05-07 MED ORDER — TRAZODONE HCL 50 MG PO TABS: 25.0000 mg | ORAL_TABLET | Freq: Every day | ORAL | 3 refills | Status: DC

## 2019-05-07 NOTE — Progress Notes (Signed)
Pt is needing a refill on [employer]. It was filled by a historical provider.

## 2019-05-07 NOTE — Patient Instructions (Addendum)
  Ashley [name] , Thank you for taking time to come for your Medicare Wellness Visit. I appreciate your ongoing commitment to your health goals. Please review the following plan we discussed and let me know if I can assist you in the future.   These are the goals we discussed: Goals    . Healthy Lifestyle     Floss and brush daily Continue to decrease amount of cigarettes daily; current is 6  Low carb diet Stay hydrated       This is a list of the screening recommended for you and due dates:  Health Maintenance  Topic Date Due  . Tetanus Vaccine  04/26/1972  . DEXA scan (bone density measurement)  04/26/2018  . Pneumonia vaccines (1 of 2 - PCV13) 04/26/2018  . Mammogram  12/14/2018  . Flu Shot  06/07/2019  . Colon Cancer Screening  07/25/2023  .  Hepatitis C: One time screening is recommended by Center for Disease Control  (CDC) for  adults born from [birth year] through 1963.   Completed

## 2019-05-07 NOTE — Progress Notes (Addendum)
Subjective:   Ashley [name] is a 68 y.o. female who presents for Medicare Annual (Subsequent) preventive examination.  Review of Systems:  No ROS.  Medicare Wellness Virtual Visit.  Visual/audio telehealth visit, UTA vital signs.   See social history for additional risk factors.   Cardiac Risk Factors include: advanced age (>2men, >87 women)     Objective:     Vitals: There were no vitals taken for this visit.  There is no height or weight on file to calculate BMI.  Advanced Directives 05/07/2019 03/16/2019 11/21/2018 07/17/2018 06/26/2018 05/29/2018 05/06/2018  Does Patient Have a Medical Advance Directive? No No No No No No No  Would patient like information on creating a medical advance directive? Yes (MAU/Ambulatory/Procedural Areas - Information given) No - Patient declined - No - Patient declined - - No - Patient declined    Tobacco Social History   Tobacco Use  Smoking Status Current Every Day Smoker  . Packs/day: 0.50  . Types: Cigarettes  . Last attempt to quit: 03/16/2019  . Years since quitting: 0.1  Smokeless Tobacco Never Used  Tobacco Comment   1/2-1 ppd     Ready to quit: Not Answered Counseling given: Not Answered Comment: 1/2-1 ppd   Clinical Intake:  Pre-visit preparation completed: Yes        Diabetes: No  How often do you need to have someone help you when you read instructions, pamphlets, or other written materials from your doctor or pharmacy?: 1 - Never  Interpreter Needed?: No     Past Medical History:  Diagnosis Date  . AICD (automatic cardioverter/defibrillator) present    on right side  . Anemia   . CAD (coronary artery disease)    s/p CABG  . CHF (congestive heart failure) ([place])   . Chronic kidney disease    renal artery stenosis  . Colon cancer ([place])   . COPD (chronic obstructive pulmonary disease) ([place])   . Depression   . Dyspnea   . GERD (gastroesophageal reflux disease)   . Hx of colonic polyps   . Hyperlipidemia    . Hypertension   . Mitral valve disorder    s/p mitral valve repair wth CABG  . Myocardial infarction ([place])   . Peripheral vascular disease ([place])   . Presence of permanent cardiac pacemaker    Pacemaker/ Defibrillator   Past Surgical History:  Procedure Laterality Date  . arm surgery     fracture, has plates and screws  . CABG with mitral valve repair    . CARDIAC CATHETERIZATION    . COLON SURGERY     colon cancer  . COLONOSCOPY WITH PROPOFOL N/A 10/09/2016   Procedure: COLONOSCOPY WITH PROPOFOL;  Surgeon: Jonathon Bellows, MD;  Location: [facility] ENDOSCOPY;  Service: Endoscopy;  Laterality: N/A;  . COLONOSCOPY WITH PROPOFOL N/A 07/24/2018   Procedure: COLONOSCOPY WITH PROPOFOL;  Surgeon: Virgel Manifold, MD;  Location: [facility] ENDOSCOPY;  Service: Endoscopy;  Laterality: N/A;  . CORONARY ARTERY BYPASS GRAFT    . ENTEROSCOPY N/A 11/21/2018   Procedure: ENTEROSCOPY-BALLOON;  Surgeon: Jonathon Bellows, MD;  Location: [facility] ENDOSCOPY;  Service: Gastroenterology;  Laterality: N/A;  . ESOPHAGOGASTRODUODENOSCOPY (EGD) WITH PROPOFOL N/A 07/24/2018   Procedure: ESOPHAGOGASTRODUODENOSCOPY (EGD) WITH PROPOFOL;  Surgeon: Virgel Manifold, MD;  Location: [facility] ENDOSCOPY;  Service: Endoscopy;  Laterality: N/A;  . GIVENS CAPSULE STUDY N/A 08/16/2018   Procedure: GIVENS CAPSULE STUDY;  Surgeon: Virgel Manifold, MD;  Location: [facility] ENDOSCOPY;  Service: Endoscopy;  Laterality: N/A;  .  pace maker defib  2016  . PERIPHERAL VASCULAR CATHETERIZATION N/A 10/23/2016   Procedure: Renal Angiography;  Surgeon: Algernon Huxley, MD;  Location: [facility];  Service: Cardiovascular;  Laterality: N/A;  . TOTAL ABDOMINAL HYSTERECTOMY  1999   history of abnormal pap   Family History  Problem Relation Age of Onset  . Arthritis Mother   . Cancer Mother        uterus cancer  . Hyperlipidemia Mother   . Hypertension Mother   . Heart disease Mother   . Diabetes Mother   . Hyperlipidemia Father   . Hypertension  Father   . Heart disease Father   . Diabetes Father   . Cancer Sister        ovary cancer  . Diabetes Maternal Grandmother   . Hypertension Maternal Grandmother   . Arthritis Maternal Grandmother   . Hypertension Maternal Grandfather   . Hypertension Paternal Grandmother   . Hypertension Paternal Grandfather   . Heart disease Paternal Grandfather   . Heart disease Brother   . Kidney disease Brother   . Breast cancer Neg Hx    Social History   Socioeconomic History  . Marital status: Widowed    Spouse name: Not on file  . Number of children: 0  . Years of education: 62  . Highest education level: 11th grade  Occupational History  . Occupation: disabled  Social Needs  . Financial resource strain: Not hard at all  . Food insecurity    Worry: Never true    Inability: Never true  . Transportation needs    Medical: No    Non-medical: No  Tobacco Use  . Smoking status: Current Every Day Smoker    Packs/day: 0.50    Types: Cigarettes    Last attempt to quit: 03/16/2019    Years since quitting: 0.1  . Smokeless tobacco: Never Used  . Tobacco comment: 1/2-1 ppd  Substance and Sexual Activity  . Alcohol use: Yes    Alcohol/week: 2.0 standard drinks    Types: 2 Shots of liquor per week  . Drug use: No  . Sexual activity: Yes    Comment: 1 partner  Lifestyle  . Physical activity    Days per week: 0 days    Minutes per session: Not on file  . Stress: Only a little  Relationships  . Social Herbalist on phone: Once a week    Gets together: Once a week    Attends religious service: Never    Active member of club or organization: No    Attends meetings of clubs or organizations: Never    Relationship status: Living with partner  Other Topics Concern  . Not on file  Social History Narrative  . Not on file    Outpatient Encounter Medications as of 05/07/2019  Medication Sig  . acetaminophen (TYLENOL) 500 MG tablet Maximum 6 tablets a day. (Patient taking  differently: Take 500 mg by mouth every 4 (four) hours as needed for fever. Maximum 6 tablets a day.)  . ADVAIR DISKUS 250-50 MCG/DOSE AEPB INHALE 1 PUFF BY MOUTH INTO THE LUNGS EVERY 12 HOURS RINSE MOUTH AFTER EACH USE  . ALPRAZolam (XANAX) 0.5 MG tablet Take 0.5-1 tablets (0.25-0.5 mg total) by mouth at bedtime. TAKE ONE TABLET BY MOUTH AT BEDTIME AS NEEDED FOR ANXIETY  . aspirin 81 MG tablet Take 81 mg by mouth daily.  [name] atorvastatin (LIPITOR) 40 MG tablet TAKE ONE TABLET BY MOUTH  ONCE DAILY  . carvedilol (COREG) 6.25 MG tablet TAKE ONE TABLET BY MOUTH TWICE DAILY WITH MEAL (Patient taking differently: Take 6.25 mg by mouth 2 (two) times daily with a meal. )  . clopidogrel (PLAVIX) 75 MG tablet TAKE ONE TABLET BY MOUTH EVERY DAY  . cyanocobalamin (,VITAMIN B-12,) 1000 MCG/ML injection Inject 1 mL weekly into the muscle monthly  . diphenhydrAMINE (DIPHENHIST) 25 mg capsule Take 25 mg by mouth at bedtime.  [name] ENTRESTO 24-26 MG TAKE ONE TABLET BY MOUTH TWICE DAILY  . escitalopram (LEXAPRO) 10 MG tablet TAKE ONE TABLET BY MOUTH ONCE DAILY  . ferrous sulfate 325 (65 FE) MG EC tablet Take 325 mg by mouth daily with breakfast.  . furosemide (LASIX) 40 MG tablet Take 1 tablet (40 mg total) by mouth daily. 2 for weight gain > 2 lbs overnight  . ipratropium-albuterol (DUONEB) 0.5-2.5 (3) MG/3ML SOLN Take 3 mLs by nebulization every 6 (six) hours as needed.  . nitroGLYCERIN (NITROSTAT) 0.4 MG SL tablet Place 1 tablet (0.4 mg total) under the tongue every 5 (five) minutes as needed for chest pain.  [name] omeprazole (PRILOSEC) 40 MG capsule TAKE ONE CAPSULE BY MOUTH ONCE DAILY  . potassium chloride (K-DUR) 10 MEQ tablet TAKE ONE TABLET BY MOUTH ONCE DAILY  . Tiotropium Bromide Monohydrate (SPIRIVA RESPIMAT) 2.5 MCG/ACT AERS Inhale 18 mcg into the lungs every morning.  . VENTOLIN HFA 108 (90 Base) MCG/ACT inhaler INHALE 2 PUFFS BY MOUTH INTO THE LUNGS EVERY 6 HOURS AS NEEDED FOR WHEEZING   No  facility-administered encounter medications on file as of 05/07/2019.     Activities of Daily Living In your present state of health, do you have any difficulty performing the following activities: 05/07/2019 03/16/2019  Hearing? N N  Vision? N N  Difficulty concentrating or making decisions? N N  Walking or climbing stairs? Y Y  Comment SOBOE -  Dressing or bathing? N Y  Doing errands, shopping? N N  Preparing Food and eating ? N -  Using the Toilet? N -  In the past six months, have you accidently leaked urine? N -  Do you have problems with loss of bowel control? N -  Managing your Medications? N -  Managing your Finances? N -  Housekeeping or managing your Housekeeping? Y -  Comment Family friend assists -  Some recent data might be hidden    Patient Care Team: [name], MD as PCP - General (Internal Medicine) Isaias Cowman, MD as Consulting Physician (Cardiology)    Assessment:   This is a routine wellness examination for Ashley [name].  I connected with patient 05/07/19 at  9:00 AM EDT by an audio enabled telemedicine application and verified that I am speaking with the correct person using two identifiers. Patient stated full name and DOB. Patient gave permission to continue with virtual visit. Patient's location was at home and Nurse's location was at [place] office.   Health Screenings  Mammogram - 12/2016; discussed. Plans to follow up with pcp. Colonoscopy - 07/2018 Bone Density - discussed. Plans to follow up with pcp Glaucoma -none Hearing -demonstrates normal hearing during visit. Hemoglobin A1C - 03/2019 Cholesterol - 11/2018 Dental- UTD Vision- visits within the last 12 months.  Social  Alcohol intake - yes      Smoking history- current   Smokers in home? none Illicit drug use? none Exercise - walking inside the home Diet - low sodium Sexually Active -yes BMI- discussed the importance of a healthy  diet, water intake and the benefits of aerobic  exercise.  Educational material provided.   Safety  Patient feels safe at home- yes Patient does have smoke detectors at home- yes Patient does wear sunscreen or protective clothing when in direct sunlight -yes Patient does wear seat belt when in a moving vehicle -yes  Covid-19 precautions and sickness symptoms discussed.   Activities of Daily Living Patient denies needing assistance with: driving, feeding themselves, getting from bed to chair, getting to the toilet, bathing/showering, dressing, managing money, or preparing meals.  Partner assists with household chores.    Depression Screen Patient denies losing interest in daily life, feeling hopeless, or crying easily over simple problems.   Medication-taking as directed and without issues.   Fall Screen Patient denies being afraid of falling or falling in the last year.   Memory Screen Patient is alert.  Patient denies difficulty focusing, concentrating or misplacing items. Patient likes to play computer games for brain stimulation.  Immunizations The following Immunizations were discussed: Influenza, shingles, pneumonia, and tetanus.   Other Providers Patient Care Team: [name], MD as PCP - General (Internal Medicine) Isaias Cowman, MD as Consulting Physician (Cardiology)  Exercise Activities and Dietary recommendations Current Exercise Habits: Home exercise routine  Goals    . Healthy Lifestyle     Floss and brush daily Continue to decrease amount of cigarettes daily; current is 6  Low carb diet Stay hydrated       Fall Risk Fall Risk  05/07/2019 12/20/2018 11/04/2018 08/06/2018 07/03/2018  Falls in the past year? 0 1 0 No No  Number falls in past yr: - 0 0 - -  Injury with Fall? - 0 0 - -  Depression Screen PHQ 2/9 Scores 05/07/2019 08/06/2018 07/03/2018 07/03/2018  PHQ - 2 Score 0 0 1 1  PHQ- 9 Score - - - -     Cognitive Function MMSE - Mini Mental State Exam 05/06/2018 09/13/2016  Orientation to  time 5 5  Orientation to Place 5 5  Registration 3 3  Attention/ Calculation 5 5  Recall 3 3  Language- name 2 objects 2 2  Language- repeat 1 1  Language- follow 3 step command 3 3  Language- read & follow direction 1 1  Write a sentence 1 1  Copy design 1 1  Total score 30 30     6CIT Screen 05/07/2019 09/13/2016  What Year? 0 points 0 points  What month? 0 points 0 points  What time? 0 points 0 points  Count back from 20 0 points 0 points  Months in reverse 0 points 0 points    Immunization History  Administered Date(s) Administered  . Influenza Split 08/20/2014  . Influenza,inj,Quad PF,6+ Mos 06/28/2016, 07/29/2018  . Pneumococcal Polysaccharide-23 08/20/2014   Screening Tests Health Maintenance  Topic Date Due  . TETANUS/TDAP  04/26/1972  . DEXA SCAN  04/26/2018  . PNA vac Low Risk Adult (1 of 2 - PCV13) 04/26/2018  . MAMMOGRAM  12/14/2018  . INFLUENZA VACCINE  06/07/2019  . COLONOSCOPY  07/25/2023  . Hepatitis C Screening  Completed      Plan:    End of life planning; Advance aging; Advanced directives discussed .  Copy of current HCPOA/Living Will requested upon completion.    I have personally reviewed and noted the following in the patient's chart:   . Medical and social history . Use of alcohol, tobacco or illicit drugs  . Current medications and supplements . Functional  ability and status . Nutritional status . Physical activity . Advanced directives . List of other physicians . Hospitalizations, surgeries, and ER visits in previous 12 months . Vitals . Screenings to include cognitive, depression, and falls . Referrals and appointments  In addition, I have reviewed and discussed with patient certain preventive protocols, quality metrics, and best practice recommendations. A written personalized care plan for preventive services as well as general preventive health recommendations were provided to patient.     OBrien-Blaney, Jmya Uliano L, LPN  06/15/3387      I have reviewed the above information and agree with above.   Deborra Medina, MD

## 2019-05-07 NOTE — Assessment & Plan Note (Signed)
Her  Last level remainedbelow 300, because she has not been coming for her injections as directed. She has requested home injections to be done by a family member, and repeat level is needed   Lab Results  Component Value Date   VITAMINB12 273 11/08/2018   .

## 2019-05-07 NOTE — Assessment & Plan Note (Addendum)
Secondary to ongoing tobacco abuse ,  spiriva refilled , continue steroid/LABA.  Urged to quit smoking. Has Duo nebs,  Using about once a week .  Socially isolating due to COVID 19

## 2019-05-07 NOTE — Assessment & Plan Note (Addendum)
Secondary to COPD and ongoing tobacco abuse,  Decreased with use of spiriva.  Tobacco cessation discussed and spiriva refilled.

## 2019-05-07 NOTE — Assessment & Plan Note (Addendum)
Secondary to AVMs.  Now resolved.  Patient no longer on iron.  Will repeat CBC in August .    Lab Results  Component Value Date   IRON 396 (H) 11/08/2018   TIBC 406 11/08/2018   FERRITIN 34 11/08/2018   Lab Results  Component Value Date   WBC 8.3 03/16/2019   HGB 13.5 03/16/2019   HCT 39.3 03/16/2019   MCV 103.7 (H) 03/16/2019   PLT 319 03/16/2019

## 2019-05-07 NOTE — Assessment & Plan Note (Signed)
continue daily use of lexapro , , adding trzodone for insomnia

## 2019-05-07 NOTE — Assessment & Plan Note (Signed)
Secondary to diuretic use.  She has been king potassium 10 meq daily .  Will stop and recheck level

## 2019-05-07 NOTE — Progress Notes (Signed)
Telephone  Visit  This visit type was conducted due to national recommendations for restrictions regarding the COVID-19 pandemic (e.g. social distancing).  This format is felt to be most appropriate for this patient at this time.  All issues noted in this document were discussed and addressed.  No physical exam was performed (except for noted visual exam findings with Video Visits).   I connected with@ on 05/07/19 at  9:30 AM EDT by a video enabled telemedicine application or telephone and verified that I am speaking with the correct person using two identifiers. Location patient: home Location provider: work or home office Persons participating in the virtual visit: patient, provider  I discussed the limitations, risks, security and privacy concerns of performing an evaluation and management service by telephone and the availability of in person appointments. I also discussed with the patient that there may be a patient responsible charge related to this service. The patient expressed understanding and agreed to proceed.  Reason for visit: follow up on multiple issues.   HPI:   COPD:  Still smoking but down to 6 cigs daily.  Cough has been more productive since running out of spiriva that was initially prescribed in May during hospitalization .  She is using nebulizer once a week  HTN:  Hasn't checked blood pressure since discharge from hospital.  Occasionally feels woozy if she stands too quickly  Insomnia:  Using alprazolam every night,  Sometimes twice for early waking.  Brother died recently, has been having increased anxiety    ROS: See pertinent positives and negatives per HPI.  Past Medical History:  Diagnosis Date  . AICD (automatic cardioverter/defibrillator) present    on right side  . Anemia   . CAD (coronary artery disease)    s/p CABG  . CHF (congestive heart failure) ([place])   . Chronic kidney disease    renal artery stenosis  . Colon cancer ([place])   . COPD (chronic  obstructive pulmonary disease) ([place])   . Depression   . Dyspnea   . GERD (gastroesophageal reflux disease)   . Hx of colonic polyps   . Hyperlipidemia   . Hypertension   . Mitral valve disorder    s/p mitral valve repair wth CABG  . Myocardial infarction ([place])   . Peripheral vascular disease ([place])   . Presence of permanent cardiac pacemaker    Pacemaker/ Defibrillator    Past Surgical History:  Procedure Laterality Date  . arm surgery     fracture, has plates and screws  . CABG with mitral valve repair    . CARDIAC CATHETERIZATION    . COLON SURGERY     colon cancer  . COLONOSCOPY WITH PROPOFOL N/A 10/09/2016   Procedure: COLONOSCOPY WITH PROPOFOL;  Surgeon: Jonathon Bellows, MD;  Location: [facility] ENDOSCOPY;  Service: Endoscopy;  Laterality: N/A;  . COLONOSCOPY WITH PROPOFOL N/A 07/24/2018   Procedure: COLONOSCOPY WITH PROPOFOL;  Surgeon: Virgel Manifold, MD;  Location: [facility] ENDOSCOPY;  Service: Endoscopy;  Laterality: N/A;  . CORONARY ARTERY BYPASS GRAFT    . ENTEROSCOPY N/A 11/21/2018   Procedure: ENTEROSCOPY-BALLOON;  Surgeon: Jonathon Bellows, MD;  Location: [facility] ENDOSCOPY;  Service: Gastroenterology;  Laterality: N/A;  . ESOPHAGOGASTRODUODENOSCOPY (EGD) WITH PROPOFOL N/A 07/24/2018   Procedure: ESOPHAGOGASTRODUODENOSCOPY (EGD) WITH PROPOFOL;  Surgeon: Virgel Manifold, MD;  Location: [facility] ENDOSCOPY;  Service: Endoscopy;  Laterality: N/A;  . GIVENS CAPSULE STUDY N/A 08/16/2018   Procedure: GIVENS CAPSULE STUDY;  Surgeon: Virgel Manifold, MD;  Location: [facility] ENDOSCOPY;  Service: Endoscopy;  Laterality: N/A;  . pace maker defib  2016  . PERIPHERAL VASCULAR CATHETERIZATION N/A 10/23/2016   Procedure: Renal Angiography;  Surgeon: Algernon Huxley, MD;  Location: [facility];  Service: Cardiovascular;  Laterality: N/A;  . TOTAL ABDOMINAL HYSTERECTOMY  1999   history of abnormal pap    Family History  Problem Relation Age of Onset  . Arthritis Mother   . Cancer Mother         uterus cancer  . Hyperlipidemia Mother   . Hypertension Mother   . Heart disease Mother   . Diabetes Mother   . Hyperlipidemia Father   . Hypertension Father   . Heart disease Father   . Diabetes Father   . Cancer Sister        ovary cancer  . Diabetes Maternal Grandmother   . Hypertension Maternal Grandmother   . Arthritis Maternal Grandmother   . Hypertension Maternal Grandfather   . Hypertension Paternal Grandmother   . Hypertension Paternal Grandfather   . Heart disease Paternal Grandfather   . Heart disease Brother   . Kidney disease Brother   . Breast cancer Neg Hx     SOCIAL HX:  reports that she has been smoking cigarettes. She has been smoking about 0.25 packs per day. She has never used smokeless tobacco. She reports current alcohol use of about 2.0 standard drinks of alcohol per week. She reports that she does not use drugs.  Current Outpatient Medications:  .  acetaminophen (TYLENOL) 500 MG tablet, Maximum 6 tablets a day. (Patient taking differently: Take 500 mg by mouth every 4 (four) hours as needed for fever. Maximum 6 tablets a day.), Disp: 42 tablet, Rfl: 0 .  ADVAIR DISKUS 250-50 MCG/DOSE AEPB, INHALE 1 PUFF BY MOUTH INTO THE LUNGS EVERY 12 HOURS RINSE MOUTH AFTER EACH USE, Disp: 180 each, Rfl: 1 .  ALPRAZolam (XANAX) 0.5 MG tablet, Take 0.5-1 tablets (0.25-0.5 mg total) by mouth at bedtime. TAKE ONE TABLET BY MOUTH AT BEDTIME AS NEEDED FOR ANXIETY, Disp: 30 tablet, Rfl: 3 .  aspirin 81 MG tablet, Take 81 mg by mouth daily., Disp: , Rfl:  .  atorvastatin (LIPITOR) 40 MG tablet, TAKE ONE TABLET BY MOUTH ONCE DAILY, Disp: 90 tablet, Rfl: 1 .  clopidogrel (PLAVIX) 75 MG tablet, TAKE ONE TABLET BY MOUTH EVERY DAY, Disp: 90 tablet, Rfl: 1 .  cyanocobalamin (,VITAMIN B-12,) 1000 MCG/ML injection, Inject 1 mL weekly into the muscle monthly, Disp: 10 mL, Rfl: 0 .  diphenhydrAMINE (DIPHENHIST) 25 mg capsule, Take 25 mg by mouth at bedtime., Disp: , Rfl:  .  escitalopram  (LEXAPRO) 10 MG tablet, TAKE ONE TABLET BY MOUTH ONCE DAILY, Disp: 90 tablet, Rfl: 1 .  ferrous sulfate 325 (65 FE) MG EC tablet, Take 325 mg by mouth daily with breakfast., Disp: , Rfl:  .  furosemide (LASIX) 40 MG tablet, Take 1 tablet (40 mg total) by mouth daily. 2 for weight gain > 2 lbs overnight, Disp: 135 tablet, Rfl: 1 .  ipratropium-albuterol (DUONEB) 0.5-2.5 (3) MG/3ML SOLN, Take 3 mLs by nebulization every 6 (six) hours as needed., Disp: 360 mL, Rfl: 1 .  nitroGLYCERIN (NITROSTAT) 0.4 MG SL tablet, Place 1 tablet (0.4 mg total) under the tongue every 5 (five) minutes as needed for chest pain., Disp: 30 tablet, Rfl: 3 .  omeprazole (PRILOSEC) 40 MG capsule, TAKE ONE CAPSULE BY MOUTH ONCE DAILY, Disp: 90 capsule, Rfl: 1 .  potassium chloride (K-DUR) 10  MEQ tablet, TAKE ONE TABLET BY MOUTH ONCE DAILY, Disp: 90 tablet, Rfl: 1 .  Tiotropium Bromide Monohydrate (SPIRIVA RESPIMAT) 2.5 MCG/ACT AERS, Inhale 5 mcg into the lungs every morning., Disp: 4 g, Rfl: 11 .  VENTOLIN HFA 108 (90 Base) MCG/ACT inhaler, INHALE 2 PUFFS BY MOUTH INTO THE LUNGS EVERY 6 HOURS AS NEEDED FOR WHEEZING, Disp: 18 g, Rfl: 2 .  carvedilol (COREG) 6.25 MG tablet, TAKE ONE TABLET BY MOUTH TWICE DAILY WITH MEAL, Disp: 180 tablet, Rfl: 1 .  sacubitril-valsartan (ENTRESTO) 24-26 MG, Take 1 tablet by mouth 2 (two) times daily., Disp: 180 tablet, Rfl: 3 .  traZODone (DESYREL) 50 MG tablet, Take 0.5-1 tablets (25-50 mg total) by mouth daily. One hour before bedtime, Disp: 90 tablet, Rfl: 3  EXAM:  VITALS per patient if applicable:  GENERAL: alert, oriented, appears well and in no acute distress  HEENT: atraumatic, conjunttiva clear, no obvious abnormalities on inspection of external nose and ears  NECK: normal movements of the head and neck  LUNGS: on inspection no signs of respiratory distress, breathing rate appears normal, no obvious gross SOB, gasping or wheezing  CV: no obvious cyanosis  MS: moves all visible  extremities without noticeable abnormality  PSYCH/NEURO: pleasant and cooperative, no obvious depression or anxiety, speech and thought processing grossly intact  ASSESSMENT AND PLAN:  Discussed the following assessment and plan:  Cough in adult Secondary to COPD and ongoing tobacco abuse,  Decreased with use of spiriva.  Tobacco cessation discussed and spiriva refilled.    COPD with emphysema ([place]) Secondary to ongoing tobacco abuse ,  spiriva refilled , continue steroid/LABA.  Urged to quit smoking. Has Duo nebs,  Using about once a week .  Socially isolating due to COVID 19  Anemia, iron deficiency Secondary to AVMs.  Now resolved.  Patient no longer on iron.  Will repeat CBC in August .    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 She has requested home injections to be done by a family member, and repeat level is needed   Lab Results  Component Value Date   VITAMINB12 273 11/08/2018   .   HTN (hypertension) encouraged to check BP at home to manage between visits.   Hypokalemia Secondary to diuretic use.  She has been king potassium 10 meq daily .  Will stop and recheck level   Insomnia She is using alprazolam to sleep on a daily basis . She has been advised of the long term risks of continued use and encouraged to use trazodone     I discussed the assessment and treatment plan with the patient. The patient was provided an opportunity to ask questions and all were answered. The patient agreed with the plan and  demonstrated an understanding of the instructions.   The patient was advised to call back or seek an in-person evaluation if the symptoms worsen or if the condition fails to improve as anticipated.  I provided 25  minutes of non-face-to-face time during this encounter.   Ashley Mc, MD

## 2019-05-07 NOTE — Assessment & Plan Note (Signed)
encouraged to check BP at home to manage between visits.

## 2019-05-07 NOTE — Patient Instructions (Signed)
I want you to try taking trazodone one hour before bedtime as an ALTERNATIVE to alprazolam, because alprazolam daily use is associated with increased risk of dementia  Start with 1/2 tablet (25 mg trazodone) and gradually increase the dose to 100 mg if needed .  (over time of about 2 weeks)   If you like awake for > 15 minutes,   You can Take 1/2 alprazolam   Stop the potassium pill   Return for fasting labs on or after august 10   .

## 2019-05-07 NOTE — Assessment & Plan Note (Signed)
She is using alprazolam to sleep on a daily basis . She has been advised of the long term risks of continued use and encouraged to use trazodone

## 2019-05-16 ENCOUNTER — Other Ambulatory Visit: Payer: Self-pay | Admitting: Internal Medicine

## 2019-05-27 ENCOUNTER — Emergency Department: Payer: Medicare Other

## 2019-05-27 ENCOUNTER — Inpatient Hospital Stay
Admission: EM | Admit: 2019-05-27 | Discharge: 2019-05-31 | DRG: 291 | Disposition: A | Discharge status: Home / Self Care | Payer: Medicare Other | Attending: Internal Medicine | Admitting: Internal Medicine

## 2019-05-27 ENCOUNTER — Other Ambulatory Visit: Payer: Self-pay

## 2019-05-27 ENCOUNTER — Encounter: Payer: Self-pay | Admitting: Emergency Medicine

## 2019-05-27 ENCOUNTER — Inpatient Hospital Stay (HOSPITAL_COMMUNITY)
Admit: 2019-05-27 | Discharge: 2019-05-27 | Disposition: A | Discharge status: Home / Self Care | Payer: Medicare Other | Attending: Internal Medicine | Admitting: Internal Medicine

## 2019-05-27 DIAGNOSIS — Z8249 Family history of ischemic heart disease and other diseases of the circulatory system: Secondary | ICD-10-CM

## 2019-05-27 DIAGNOSIS — D509 Iron deficiency anemia, unspecified: Secondary | ICD-10-CM | POA: Diagnosis present

## 2019-05-27 DIAGNOSIS — K219 Gastro-esophageal reflux disease without esophagitis: Secondary | ICD-10-CM | POA: Diagnosis present

## 2019-05-27 DIAGNOSIS — Z7901 Long term (current) use of anticoagulants: Secondary | ICD-10-CM | POA: Diagnosis not present

## 2019-05-27 DIAGNOSIS — Z95810 Presence of automatic (implantable) cardiac defibrillator: Secondary | ICD-10-CM

## 2019-05-27 DIAGNOSIS — I5043 Acute on chronic combined systolic (congestive) and diastolic (congestive) heart failure: Secondary | ICD-10-CM | POA: Diagnosis present

## 2019-05-27 DIAGNOSIS — I5033 Acute on chronic diastolic (congestive) heart failure: Secondary | ICD-10-CM | POA: Diagnosis not present

## 2019-05-27 DIAGNOSIS — I2510 Atherosclerotic heart disease of native coronary artery without angina pectoris: Secondary | ICD-10-CM | POA: Diagnosis present

## 2019-05-27 DIAGNOSIS — Z8261 Family history of arthritis: Secondary | ICD-10-CM | POA: Diagnosis not present

## 2019-05-27 DIAGNOSIS — F329 Major depressive disorder, single episode, unspecified: Secondary | ICD-10-CM | POA: Diagnosis present

## 2019-05-27 DIAGNOSIS — A020 Salmonella enteritis: Secondary | ICD-10-CM | POA: Diagnosis present

## 2019-05-27 DIAGNOSIS — F17200 Nicotine dependence, unspecified, uncomplicated: Secondary | ICD-10-CM

## 2019-05-27 DIAGNOSIS — E1165 Type 2 diabetes mellitus with hyperglycemia: Secondary | ICD-10-CM | POA: Diagnosis not present

## 2019-05-27 DIAGNOSIS — Z8041 Family history of malignant neoplasm of ovary: Secondary | ICD-10-CM

## 2019-05-27 DIAGNOSIS — E1151 Type 2 diabetes mellitus with diabetic peripheral angiopathy without gangrene: Secondary | ICD-10-CM | POA: Diagnosis present

## 2019-05-27 DIAGNOSIS — E876 Hypokalemia: Secondary | ICD-10-CM | POA: Diagnosis present

## 2019-05-27 DIAGNOSIS — E1110 Type 2 diabetes mellitus with ketoacidosis without coma: Secondary | ICD-10-CM | POA: Diagnosis present

## 2019-05-27 DIAGNOSIS — R509 Fever, unspecified: Secondary | ICD-10-CM

## 2019-05-27 DIAGNOSIS — I110 Hypertensive heart disease with heart failure: Secondary | ICD-10-CM | POA: Diagnosis not present

## 2019-05-27 DIAGNOSIS — F17210 Nicotine dependence, cigarettes, uncomplicated: Secondary | ICD-10-CM | POA: Diagnosis present

## 2019-05-27 DIAGNOSIS — Z90710 Acquired absence of both cervix and uterus: Secondary | ICD-10-CM

## 2019-05-27 DIAGNOSIS — I160 Hypertensive urgency: Secondary | ICD-10-CM | POA: Diagnosis present

## 2019-05-27 DIAGNOSIS — N189 Chronic kidney disease, unspecified: Secondary | ICD-10-CM | POA: Diagnosis present

## 2019-05-27 DIAGNOSIS — E785 Hyperlipidemia, unspecified: Secondary | ICD-10-CM | POA: Diagnosis present

## 2019-05-27 DIAGNOSIS — J9601 Acute respiratory failure with hypoxia: Secondary | ICD-10-CM | POA: Diagnosis present

## 2019-05-27 DIAGNOSIS — Z1159 Encounter for screening for other viral diseases: Secondary | ICD-10-CM

## 2019-05-27 DIAGNOSIS — I252 Old myocardial infarction: Secondary | ICD-10-CM | POA: Diagnosis not present

## 2019-05-27 DIAGNOSIS — I509 Heart failure, unspecified: Secondary | ICD-10-CM

## 2019-05-27 DIAGNOSIS — R0602 Shortness of breath: Secondary | ICD-10-CM | POA: Diagnosis not present

## 2019-05-27 DIAGNOSIS — J9600 Acute respiratory failure, unspecified whether with hypoxia or hypercapnia: Secondary | ICD-10-CM | POA: Diagnosis not present

## 2019-05-27 DIAGNOSIS — R0902 Hypoxemia: Secondary | ICD-10-CM

## 2019-05-27 DIAGNOSIS — Z7951 Long term (current) use of inhaled steroids: Secondary | ICD-10-CM | POA: Diagnosis not present

## 2019-05-27 DIAGNOSIS — Z8349 Family history of other endocrine, nutritional and metabolic diseases: Secondary | ICD-10-CM

## 2019-05-27 DIAGNOSIS — Z85038 Personal history of other malignant neoplasm of large intestine: Secondary | ICD-10-CM

## 2019-05-27 DIAGNOSIS — R231 Pallor: Secondary | ICD-10-CM | POA: Diagnosis not present

## 2019-05-27 DIAGNOSIS — R778 Other specified abnormalities of plasma proteins: Secondary | ICD-10-CM

## 2019-05-27 DIAGNOSIS — J810 Acute pulmonary edema: Secondary | ICD-10-CM | POA: Diagnosis not present

## 2019-05-27 DIAGNOSIS — R062 Wheezing: Secondary | ICD-10-CM

## 2019-05-27 DIAGNOSIS — I340 Nonrheumatic mitral (valve) insufficiency: Secondary | ICD-10-CM | POA: Diagnosis present

## 2019-05-27 DIAGNOSIS — I130 Hypertensive heart and chronic kidney disease with heart failure and stage 1 through stage 4 chronic kidney disease, or unspecified chronic kidney disease: Principal | ICD-10-CM | POA: Diagnosis present

## 2019-05-27 DIAGNOSIS — R000 Tachycardia, unspecified: Secondary | ICD-10-CM | POA: Diagnosis not present

## 2019-05-27 DIAGNOSIS — R7989 Other specified abnormal findings of blood chemistry: Secondary | ICD-10-CM

## 2019-05-27 DIAGNOSIS — Z833 Family history of diabetes mellitus: Secondary | ICD-10-CM

## 2019-05-27 DIAGNOSIS — E11649 Type 2 diabetes mellitus with hypoglycemia without coma: Secondary | ICD-10-CM | POA: Diagnosis not present

## 2019-05-27 DIAGNOSIS — J811 Chronic pulmonary edema: Secondary | ICD-10-CM | POA: Diagnosis not present

## 2019-05-27 DIAGNOSIS — E1122 Type 2 diabetes mellitus with diabetic chronic kidney disease: Secondary | ICD-10-CM | POA: Diagnosis present

## 2019-05-27 DIAGNOSIS — Z951 Presence of aortocoronary bypass graft: Secondary | ICD-10-CM

## 2019-05-27 DIAGNOSIS — R197 Diarrhea, unspecified: Secondary | ICD-10-CM | POA: Diagnosis not present

## 2019-05-27 DIAGNOSIS — R069 Unspecified abnormalities of breathing: Secondary | ICD-10-CM | POA: Diagnosis not present

## 2019-05-27 DIAGNOSIS — J9690 Respiratory failure, unspecified, unspecified whether with hypoxia or hypercapnia: Secondary | ICD-10-CM

## 2019-05-27 DIAGNOSIS — J90 Pleural effusion, not elsewhere classified: Secondary | ICD-10-CM | POA: Diagnosis not present

## 2019-05-27 DIAGNOSIS — Z8049 Family history of malignant neoplasm of other genital organs: Secondary | ICD-10-CM

## 2019-05-27 LAB — CBC WITH DIFFERENTIAL/PLATELET
Abs Immature Granulocytes: 0.13 10*3/uL — ABNORMAL HIGH (ref 0.00–0.07)
Basophils Absolute: 0.1 10*3/uL (ref 0.0–0.1)
Basophils Relative: 1 %
Eosinophils Absolute: 0.1 10*3/uL (ref 0.0–0.5)
Eosinophils Relative: 0 %
HCT: 35.3 % — ABNORMAL LOW (ref 36.0–46.0)
Hemoglobin: 11.9 g/dL — ABNORMAL LOW (ref 12.0–15.0)
Immature Granulocytes: 1 %
Lymphocytes Relative: 7 %
Lymphs Abs: 1.1 10*3/uL (ref 0.7–4.0)
MCH: 35.7 pg — ABNORMAL HIGH (ref 26.0–34.0)
MCHC: 33.7 g/dL (ref 30.0–36.0)
MCV: 106 fL — ABNORMAL HIGH (ref 80.0–100.0)
Monocytes Absolute: 0.9 10*3/uL (ref 0.1–1.0)
Monocytes Relative: 6 %
Neutro Abs: 14 10*3/uL — ABNORMAL HIGH (ref 1.7–7.7)
Neutrophils Relative %: 85 %
Platelets: 336 10*3/uL (ref 150–400)
RBC: 3.33 MIL/uL — ABNORMAL LOW (ref 3.87–5.11)
RDW: 14.8 % (ref 11.5–15.5)
WBC: 16.4 10*3/uL — ABNORMAL HIGH (ref 4.0–10.5)
nRBC: 0.2 % (ref 0.0–0.2)

## 2019-05-27 LAB — COMPREHENSIVE METABOLIC PANEL
ALT: 36 U/L (ref 0–44)
AST: 69 U/L — ABNORMAL HIGH (ref 15–41)
Albumin: 3.7 g/dL (ref 3.5–5.0)
Alkaline Phosphatase: 88 U/L (ref 38–126)
Anion gap: 23 — ABNORMAL HIGH (ref 5–15)
BUN: 12 mg/dL (ref 8–23)
CO2: 15 mmol/L — ABNORMAL LOW (ref 22–32)
Calcium: 8.5 mg/dL — ABNORMAL LOW (ref 8.9–10.3)
Chloride: 98 mmol/L (ref 98–111)
Creatinine, Ser: 1.32 mg/dL — ABNORMAL HIGH (ref 0.44–1.00)
GFR calc Af Amer: 49 mL/min — ABNORMAL LOW (ref >60)
GFR calc non Af Amer: 42 mL/min — ABNORMAL LOW (ref >60)
Glucose, Bld: 437 mg/dL — ABNORMAL HIGH (ref 70–99)
Potassium: 3.4 mmol/L — ABNORMAL LOW (ref 3.5–5.1)
Sodium: 136 mmol/L (ref 135–145)
Total Bilirubin: 2.2 mg/dL — ABNORMAL HIGH (ref 0.3–1.2)
Total Protein: 6.7 g/dL (ref 6.5–8.1)

## 2019-05-27 LAB — BASIC METABOLIC PANEL
Anion gap: 17 — ABNORMAL HIGH (ref 5–15)
BUN: 12 mg/dL (ref 8–23)
CO2: 23 mmol/L (ref 22–32)
Calcium: 8.8 mg/dL — ABNORMAL LOW (ref 8.9–10.3)
Chloride: 100 mmol/L (ref 98–111)
Creatinine, Ser: 1.26 mg/dL — ABNORMAL HIGH (ref 0.44–1.00)
GFR calc Af Amer: 51 mL/min — ABNORMAL LOW (ref >60)
GFR calc non Af Amer: 44 mL/min — ABNORMAL LOW (ref >60)
Glucose, Bld: 244 mg/dL — ABNORMAL HIGH (ref 70–99)
Potassium: 2.6 mmol/L — CL (ref 3.5–5.1)
Sodium: 140 mmol/L (ref 135–145)

## 2019-05-27 LAB — GLUCOSE, CAPILLARY
Glucose-Capillary: 284 mg/dL — ABNORMAL HIGH (ref 70–99)
Glucose-Capillary: 211 mg/dL — ABNORMAL HIGH (ref 70–99)
Glucose-Capillary: 233 mg/dL — ABNORMAL HIGH (ref 70–99)
Glucose-Capillary: 210 mg/dL — ABNORMAL HIGH (ref 70–99)
Glucose-Capillary: 209 mg/dL — ABNORMAL HIGH (ref 70–99)
Glucose-Capillary: 263 mg/dL — ABNORMAL HIGH (ref 70–99)
Glucose-Capillary: 243 mg/dL — ABNORMAL HIGH (ref 70–99)
Glucose-Capillary: 193 mg/dL — ABNORMAL HIGH (ref 70–99)
Glucose-Capillary: 196 mg/dL — ABNORMAL HIGH (ref 70–99)

## 2019-05-27 LAB — MAGNESIUM: Magnesium: 2.1 mg/dL (ref 1.7–2.4)

## 2019-05-27 LAB — TROPONIN I (HIGH SENSITIVITY)
Troponin I (High Sensitivity): 55 ng/L — ABNORMAL HIGH (ref <18)
Troponin I (High Sensitivity): 78 ng/L — ABNORMAL HIGH (ref <18)

## 2019-05-27 LAB — MRSA PCR SCREENING: MRSA by PCR: NEGATIVE

## 2019-05-27 LAB — SARS CORONAVIRUS 2 BY RT PCR (HOSPITAL ORDER, PERFORMED IN ~~LOC~~ HOSPITAL LAB): SARS Coronavirus 2: NEGATIVE

## 2019-05-27 LAB — PROCALCITONIN: Procalcitonin: 3.12 ng/mL

## 2019-05-27 LAB — BRAIN NATRIURETIC PEPTIDE: B Natriuretic Peptide: 4221 pg/mL — ABNORMAL HIGH (ref 0.0–100.0)

## 2019-05-27 MED ORDER — IPRATROPIUM-ALBUTEROL 0.5-2.5 (3) MG/3ML IN SOLN
3.0000 mL | Freq: Four times a day (QID) | RESPIRATORY_TRACT | Status: DC
  Administered 2019-05-27 – 2019-05-31 (×16): 3 mL via RESPIRATORY_TRACT
  Filled 2019-05-27 (×16): qty 3

## 2019-05-27 MED ORDER — BUDESONIDE 0.25 MG/2ML IN SUSP
0.2500 mg | Freq: Four times a day (QID) | RESPIRATORY_TRACT | Status: DC
  Administered 2019-05-27 – 2019-05-31 (×16): 0.25 mg via RESPIRATORY_TRACT
  Filled 2019-05-27 (×16): qty 2

## 2019-05-27 MED ORDER — INSULIN ASPART 100 UNIT/ML ~~LOC~~ SOLN: 0.0000 [IU] | Freq: Every day | SUBCUTANEOUS | Status: DC

## 2019-05-27 MED ORDER — TIOTROPIUM BROMIDE MONOHYDRATE 2.5 MCG/ACT IN AERS: 5.0000 ug | INHALATION_SPRAY | Freq: Every morning | RESPIRATORY_TRACT | Status: DC

## 2019-05-27 MED ORDER — IPRATROPIUM-ALBUTEROL 0.5-2.5 (3) MG/3ML IN SOLN
3.0000 mL | Freq: Once | RESPIRATORY_TRACT | Status: AC
  Administered 2019-05-27: 3 mL via RESPIRATORY_TRACT
  Filled 2019-05-27: qty 3

## 2019-05-27 MED ORDER — INSULIN REGULAR BOLUS VIA INFUSION
0.0000 [IU] | Freq: Three times a day (TID) | INTRAVENOUS | Status: DC
  Filled 2019-05-27: qty 10

## 2019-05-27 MED ORDER — NITROGLYCERIN 0.4 MG SL SUBL: 0.4000 mg | SUBLINGUAL_TABLET | SUBLINGUAL | Status: DC | PRN

## 2019-05-27 MED ORDER — SODIUM CHLORIDE 0.9 % IV SOLN: INTRAVENOUS | Status: DC

## 2019-05-27 MED ORDER — FUROSEMIDE 10 MG/ML IJ SOLN
40.0000 mg | Freq: Once | INTRAMUSCULAR | Status: AC
  Administered 2019-05-27: 05:00:00 40 mg via INTRAVENOUS
  Filled 2019-05-27: qty 4

## 2019-05-27 MED ORDER — CHLORHEXIDINE GLUCONATE CLOTH 2 % EX PADS: 6.0000 | MEDICATED_PAD | Freq: Every day | CUTANEOUS | Status: DC

## 2019-05-27 MED ORDER — ASPIRIN EC 81 MG PO TBEC
81.0000 mg | DELAYED_RELEASE_TABLET | Freq: Every day | ORAL | Status: DC
  Administered 2019-05-27 – 2019-05-31 (×5): 81 mg via ORAL
  Filled 2019-05-27 (×5): qty 1

## 2019-05-27 MED ORDER — INSULIN ASPART 100 UNIT/ML IV SOLN
8.0000 [IU] | Freq: Once | INTRAVENOUS | Status: AC
  Administered 2019-05-27: 8 [IU] via INTRAVENOUS
  Filled 2019-05-27: qty 0.08

## 2019-05-27 MED ORDER — ZOLPIDEM TARTRATE 5 MG PO TABS: 5.0000 mg | ORAL_TABLET | Freq: Every evening | ORAL | Status: DC | PRN

## 2019-05-27 MED ORDER — ACETAMINOPHEN 325 MG PO TABS
650.0000 mg | ORAL_TABLET | ORAL | Status: DC | PRN
  Filled 2019-05-27: qty 2

## 2019-05-27 MED ORDER — MAGNESIUM SULFATE 2 GM/50ML IV SOLN
2.0000 g | Freq: Once | INTRAVENOUS | Status: AC
  Administered 2019-05-27: 2 g via INTRAVENOUS
  Filled 2019-05-27: qty 50

## 2019-05-27 MED ORDER — ALPRAZOLAM 0.5 MG PO TABS
0.2500 mg | ORAL_TABLET | Freq: Every day | ORAL | Status: DC
  Administered 2019-05-27 – 2019-05-30 (×4): 0.5 mg via ORAL
  Filled 2019-05-27 (×4): qty 1

## 2019-05-27 MED ORDER — SODIUM CHLORIDE 0.9 % IV SOLN: 250.0000 mL | INTRAVENOUS | Status: DC | PRN

## 2019-05-27 MED ORDER — INSULIN REGULAR(HUMAN) IN NACL 100-0.9 UT/100ML-% IV SOLN
INTRAVENOUS | Status: AC
  Administered 2019-05-27: 1.5 [IU]/h via INTRAVENOUS
  Filled 2019-05-27: qty 100

## 2019-05-27 MED ORDER — DEXTROSE 50 % IV SOLN: 25.0000 mL | INTRAVENOUS | Status: DC | PRN

## 2019-05-27 MED ORDER — INSULIN ASPART 100 UNIT/ML ~~LOC~~ SOLN
0.0000 [IU] | Freq: Three times a day (TID) | SUBCUTANEOUS | Status: DC
  Administered 2019-05-27 – 2019-05-29 (×4): 3 [IU] via SUBCUTANEOUS
  Administered 2019-05-31 (×2): 2 [IU] via SUBCUTANEOUS
  Filled 2019-05-27 (×6): qty 1

## 2019-05-27 MED ORDER — POTASSIUM CHLORIDE CRYS ER 20 MEQ PO TBCR
40.0000 meq | EXTENDED_RELEASE_TABLET | Freq: Three times a day (TID) | ORAL | Status: AC
  Administered 2019-05-27 – 2019-05-28 (×4): 40 meq via ORAL
  Filled 2019-05-27 (×4): qty 2

## 2019-05-27 MED ORDER — ONDANSETRON HCL 4 MG/2ML IJ SOLN
4.0000 mg | Freq: Four times a day (QID) | INTRAMUSCULAR | Status: DC | PRN
  Administered 2019-05-28: 4 mg via INTRAVENOUS
  Filled 2019-05-27: qty 2

## 2019-05-27 MED ORDER — FUROSEMIDE 10 MG/ML IJ SOLN
40.0000 mg | Freq: Two times a day (BID) | INTRAMUSCULAR | Status: AC
  Administered 2019-05-27: 40 mg via INTRAVENOUS
  Filled 2019-05-27: qty 4

## 2019-05-27 MED ORDER — PANTOPRAZOLE SODIUM 40 MG PO TBEC
40.0000 mg | DELAYED_RELEASE_TABLET | Freq: Every day | ORAL | Status: DC
  Administered 2019-05-27 – 2019-05-31 (×5): 40 mg via ORAL
  Filled 2019-05-27 (×5): qty 1

## 2019-05-27 MED ORDER — INSULIN GLARGINE 100 UNIT/ML ~~LOC~~ SOLN
20.0000 [IU] | Freq: Every day | SUBCUTANEOUS | Status: DC
  Administered 2019-05-27 – 2019-05-31 (×5): 20 [IU] via SUBCUTANEOUS
  Filled 2019-05-27 (×6): qty 0.2

## 2019-05-27 MED ORDER — POTASSIUM CHLORIDE CRYS ER 10 MEQ PO TBCR
10.0000 meq | EXTENDED_RELEASE_TABLET | Freq: Every day | ORAL | Status: DC
  Filled 2019-05-27: qty 1

## 2019-05-27 MED ORDER — CARVEDILOL 6.25 MG PO TABS: 6.2500 mg | ORAL_TABLET | Freq: Two times a day (BID) | ORAL | Status: DC

## 2019-05-27 MED ORDER — SODIUM CHLORIDE 0.9% FLUSH: 3.0000 mL | INTRAVENOUS | Status: DC | PRN

## 2019-05-27 MED ORDER — CLOPIDOGREL BISULFATE 75 MG PO TABS
75.0000 mg | ORAL_TABLET | Freq: Every day | ORAL | Status: DC
  Administered 2019-05-27 – 2019-05-31 (×5): 75 mg via ORAL
  Filled 2019-05-27 (×5): qty 1

## 2019-05-27 MED ORDER — SACUBITRIL-VALSARTAN 24-26 MG PO TABS
1.0000 | ORAL_TABLET | Freq: Two times a day (BID) | ORAL | Status: DC
  Administered 2019-05-27 – 2019-05-31 (×9): 1 via ORAL
  Filled 2019-05-27 (×10): qty 1

## 2019-05-27 MED ORDER — POTASSIUM CHLORIDE CRYS ER 20 MEQ PO TBCR: 40.0000 meq | EXTENDED_RELEASE_TABLET | Freq: Two times a day (BID) | ORAL | Status: DC

## 2019-05-27 MED ORDER — ESCITALOPRAM OXALATE 10 MG PO TABS
10.0000 mg | ORAL_TABLET | Freq: Every day | ORAL | Status: DC
  Administered 2019-05-27 – 2019-05-31 (×5): 10 mg via ORAL
  Filled 2019-05-27 (×5): qty 1

## 2019-05-27 MED ORDER — ENOXAPARIN SODIUM 40 MG/0.4ML ~~LOC~~ SOLN
40.0000 mg | SUBCUTANEOUS | Status: DC
  Administered 2019-05-27 – 2019-05-30 (×4): 40 mg via SUBCUTANEOUS
  Filled 2019-05-27 (×4): qty 0.4

## 2019-05-27 MED ORDER — TRAZODONE HCL 50 MG PO TABS
25.0000 mg | ORAL_TABLET | Freq: Every day | ORAL | Status: DC
  Administered 2019-05-27 – 2019-05-30 (×4): 50 mg via ORAL
  Filled 2019-05-27 (×4): qty 1

## 2019-05-27 MED ORDER — FERROUS SULFATE 325 (65 FE) MG PO TABS
325.0000 mg | ORAL_TABLET | Freq: Every day | ORAL | Status: DC
  Administered 2019-05-27 – 2019-05-31 (×5): 325 mg via ORAL
  Filled 2019-05-27 (×6): qty 1

## 2019-05-27 MED ORDER — NITROGLYCERIN 2 % TD OINT
1.0000 [in_us] | TOPICAL_OINTMENT | Freq: Once | TRANSDERMAL | Status: AC
  Administered 2019-05-27: 1 [in_us] via TOPICAL
  Filled 2019-05-27: qty 1

## 2019-05-27 MED ORDER — SODIUM CHLORIDE 0.9% FLUSH
3.0000 mL | Freq: Two times a day (BID) | INTRAVENOUS | Status: DC
  Administered 2019-05-27 – 2019-05-31 (×9): 3 mL via INTRAVENOUS

## 2019-05-27 MED ORDER — POTASSIUM CHLORIDE 20 MEQ PO PACK: 40.0000 meq | PACK | Freq: Once | ORAL | Status: DC

## 2019-05-27 MED ORDER — CARVEDILOL 6.25 MG PO TABS
6.2500 mg | ORAL_TABLET | Freq: Two times a day (BID) | ORAL | Status: DC
  Administered 2019-05-28 – 2019-05-31 (×6): 6.25 mg via ORAL
  Filled 2019-05-27 (×6): qty 1

## 2019-05-27 MED ORDER — POTASSIUM CHLORIDE CRYS ER 20 MEQ PO TBCR
40.0000 meq | EXTENDED_RELEASE_TABLET | Freq: Once | ORAL | Status: AC
  Administered 2019-05-27: 40 meq via ORAL
  Filled 2019-05-27: qty 2

## 2019-05-27 MED ORDER — IPRATROPIUM-ALBUTEROL 0.5-2.5 (3) MG/3ML IN SOLN: 3.0000 mL | RESPIRATORY_TRACT | Status: DC | PRN

## 2019-05-27 MED ORDER — ATORVASTATIN CALCIUM 20 MG PO TABS
40.0000 mg | ORAL_TABLET | Freq: Every day | ORAL | Status: DC
  Administered 2019-05-27 – 2019-05-31 (×5): 40 mg via ORAL
  Filled 2019-05-27 (×5): qty 2

## 2019-05-27 NOTE — ED Notes (Signed)
Patient states she is feeling much better. Patient is visibly fatigued, but easily becomes alert when spoken to.

## 2019-05-27 NOTE — ED Notes (Signed)
ED TO INPATIENT HANDOFF REPORT  ED Nurse Name and Phone #: Irfan Veal [phone]  S Name/Age/Gender Ashley [name] 68 y.o. female Room/Bed: ED01A/ED01A  Code Status   Code Status: Full Code  Home/SNF/Other Home Patient oriented to: self, place, time and situation Is this baseline? Yes   Triage Complete: Triage complete  Chief Complaint respiratory distress  Triage Note Patient to ER from home via [facility] for c/o respiratory distress. Had breathing treatments of her own that didn't seem to improve her status. Patient had 2 duonebs via EMS, 125mg  Solumedrol. Patient on NRB upon arrival, last O2 sat for EMS was 94%.    Allergies Allergies  Allergen Reactions  . Hydrocodone-Acetaminophen Nausea Only    Level of Care/Admitting Diagnosis ED Disposition    ED Disposition Condition [facility]: [place] [[id number]]  Level of Care: Stepdown [14]  Covid Evaluation: Person Under Investigation (PUI)  Diagnosis: Acute on chronic combined systolic and diastolic CHF (congestive heart failure) [facility]) [[id number]]  Admitting Physician: Christel Mormon [[id number]]  Attending Physician: Christel Mormon [[id number]]  Estimated length of stay: 3 - 4 days  Certification:: I certify this patient will need inpatient services for at least 2 midnights  PT Class (Do Not Modify): Inpatient [101]  PT Acc Code (Do Not Modify): Private [1]       B Medical/Surgery History Past Medical History:  Diagnosis Date  . AICD (automatic cardioverter/defibrillator) present    on right side  . Anemia   . CAD (coronary artery disease)    s/p CABG  . CHF (congestive heart failure) ([place])   . Chronic kidney disease    renal artery stenosis  . Colon cancer ([place])   . COPD (chronic obstructive pulmonary disease) ([place])   . Depression   . Dyspnea   . GERD (gastroesophageal reflux disease)   . Hx of colonic polyps   . Hyperlipidemia   . Hypertension   . Mitral valve disorder    s/p  mitral valve repair wth CABG  . Myocardial infarction ([place])   . Peripheral vascular disease ([place])   . Presence of permanent cardiac pacemaker    Pacemaker/ Defibrillator   Past Surgical History:  Procedure Laterality Date  . arm surgery     fracture, has plates and screws  . CABG with mitral valve repair    . CARDIAC CATHETERIZATION    . COLON SURGERY     colon cancer  . COLONOSCOPY WITH PROPOFOL N/A 10/09/2016   Procedure: COLONOSCOPY WITH PROPOFOL;  Surgeon: Jonathon Bellows, MD;  Location: [facility] ENDOSCOPY;  Service: Endoscopy;  Laterality: N/A;  . COLONOSCOPY WITH PROPOFOL N/A 07/24/2018   Procedure: COLONOSCOPY WITH PROPOFOL;  Surgeon: Virgel Manifold, MD;  Location: [facility] ENDOSCOPY;  Service: Endoscopy;  Laterality: N/A;  . CORONARY ARTERY BYPASS GRAFT    . ENTEROSCOPY N/A 11/21/2018   Procedure: ENTEROSCOPY-BALLOON;  Surgeon: Jonathon Bellows, MD;  Location: [facility] ENDOSCOPY;  Service: Gastroenterology;  Laterality: N/A;  . ESOPHAGOGASTRODUODENOSCOPY (EGD) WITH PROPOFOL N/A 07/24/2018   Procedure: ESOPHAGOGASTRODUODENOSCOPY (EGD) WITH PROPOFOL;  Surgeon: Virgel Manifold, MD;  Location: [facility] ENDOSCOPY;  Service: Endoscopy;  Laterality: N/A;  . GIVENS CAPSULE STUDY N/A 08/16/2018   Procedure: GIVENS CAPSULE STUDY;  Surgeon: Virgel Manifold, MD;  Location: [facility] ENDOSCOPY;  Service: Endoscopy;  Laterality: N/A;  . pace maker defib  2016  . PERIPHERAL VASCULAR CATHETERIZATION N/A 10/23/2016   Procedure: Renal Angiography;  Surgeon: Algernon Huxley, MD;  Location: [facility]  INVASIVE CV LAB;  Service: Cardiovascular;  Laterality: N/A;  . TOTAL ABDOMINAL HYSTERECTOMY  1999   history of abnormal pap     A IV Location/Drains/Wounds Patient Lines/Drains/Airways Status   Active Line/Drains/Airways    Name:   Placement date:   Placement time:   Site:   Days:   Peripheral IV 05/27/19 Right Hand   05/27/19    0445    Hand   less than 1   Peripheral IV 05/27/19 Left Hand   05/27/19    0500    Hand    less than 1          Intake/Output Last 24 hours  Intake/Output Summary (Last 24 hours) at 05/27/2019 0829 Last data filed at 05/27/2019 0503 Gross per 24 hour  Intake 50 ml  Output -  Net 50 ml    Labs/Imaging Results for orders placed or performed during the hospital encounter of 05/27/19 (from the past 48 hour(s))  Comprehensive metabolic panel     Status: Abnormal   Collection Time: 05/27/19  4:20 AM  Result Value Ref Range   Sodium 136 135 - 145 mmol/L    Comment: REPEATED ELECTROLYTES [place]   Potassium 3.4 (L) 3.5 - 5.1 mmol/L   Chloride 98 98 - 111 mmol/L   CO2 15 (L) 22 - 32 mmol/L   Glucose, Bld 437 (H) 70 - 99 mg/dL   BUN 12 8 - 23 mg/dL   Creatinine, Ser 1.32 (H) 0.44 - 1.00 mg/dL   Calcium 8.5 (L) 8.9 - 10.3 mg/dL   Total Protein 6.7 6.5 - 8.1 g/dL   Albumin 3.7 3.5 - 5.0 g/dL   AST 69 (H) 15 - 41 U/L   ALT 36 0 - 44 U/L   Alkaline Phosphatase 88 38 - 126 U/L   Total Bilirubin 2.2 (H) 0.3 - 1.2 mg/dL   GFR calc non Af Amer 42 (L) >60 mL/min   GFR calc Af Amer 49 (L) >60 mL/min   Anion gap 23 (H) 5 - 15    Comment: Performed at [facility], [address] [place], [place] [address]  Troponin I (High Sensitivity)     Status: Abnormal   Collection Time: 05/27/19  4:20 AM  Result Value Ref Range   Troponin I (High Sensitivity) 55 (H) <18 ng/L    Comment: (NOTE) Elevated high sensitivity troponin I (hsTnI) values and significant  changes across serial measurements may suggest ACS but many other  chronic and acute conditions are known to elevate hsTnI results.  Refer to the "Links" section for chest pain algorithms and additional  guidance. Performed at [facility], [address] [place], [place] [address]   Brain natriuretic peptide     Status: Abnormal   Collection Time: 05/27/19  4:20 AM  Result Value Ref Range   B Natriuretic Peptide 4,221.0 (H) 0.0 - 100.0 pg/mL    Comment: Performed at [facility], [address] [place], [place] [address]  CBC with Differential     Status: Abnormal   Collection Time: 05/27/19  4:20 AM  Result Value Ref Range   WBC 16.4 (H) 4.0 - 10.5 K/uL   RBC 3.33 (L) 3.87 - 5.11 MIL/uL   Hemoglobin 11.9 (L) 12.0 - 15.0 g/dL   HCT 35.3 (L) 36.0 - 46.0 %   MCV 106.0 (H) 80.0 - 100.0 fL   MCH 35.7 (H) 26.0 - 34.0 pg   MCHC 33.7 30.0 - 36.0 g/dL  RDW 14.8 11.5 - 15.5 %   Platelets 336 150 - 400 K/uL   nRBC 0.2 0.0 - 0.2 %   Neutrophils Relative % 85 %   Neutro Abs 14.0 (H) 1.7 - 7.7 K/uL   Lymphocytes Relative 7 %   Lymphs Abs 1.1 0.7 - 4.0 K/uL   Monocytes Relative 6 %   Monocytes Absolute 0.9 0.1 - 1.0 K/uL   Eosinophils Relative 0 %   Eosinophils Absolute 0.1 0.0 - 0.5 K/uL   Basophils Relative 1 %   Basophils Absolute 0.1 0.0 - 0.1 K/uL   Immature Granulocytes 1 %   Abs Immature Granulocytes 0.13 (H) 0.00 - 0.07 K/uL    Comment: Performed at [facility], [address], [place], [place] [address]  SARS Coronavirus 2 (CEPHEID- Performed in [place] hospital lab), Hosp Order     Status: None   Collection Time: 05/27/19  5:16 AM   Specimen: Nasopharyngeal Swab  Result Value Ref Range   SARS Coronavirus 2 NEGATIVE NEGATIVE    Comment: (NOTE) If result is NEGATIVE SARS-CoV-2 target nucleic acids are NOT DETECTED. The SARS-CoV-2 RNA is generally detectable in upper and lower  respiratory specimens during the acute phase of infection. The lowest  concentration of SARS-CoV-2 viral copies this assay can detect is 250  copies / mL. A negative result does not preclude SARS-CoV-2 infection  and should not be used as the sole basis for treatment or other  patient management decisions.  A negative result may occur with  improper specimen collection / handling, submission of specimen other  than nasopharyngeal swab, presence of viral mutation(s) within the  areas targeted by this assay, and inadequate number of viral copies  (<250 copies / mL). A negative result  must be combined with clinical  observations, patient history, and epidemiological information. If result is POSITIVE SARS-CoV-2 target nucleic acids are DETECTED. The SARS-CoV-2 RNA is generally detectable in upper and lower  respiratory specimens dur ing the acute phase of infection.  Positive  results are indicative of active infection with SARS-CoV-2.  Clinical  correlation with patient history and other diagnostic information is  necessary to determine patient infection status.  Positive results do  not rule out bacterial infection or co-infection with other viruses. If result is PRESUMPTIVE POSTIVE SARS-CoV-2 nucleic acids MAY BE PRESENT.   A presumptive positive result was obtained on the submitted specimen  and confirmed on repeat testing.  While 2019 novel coronavirus  (SARS-CoV-2) nucleic acids may be present in the submitted sample  additional confirmatory testing may be necessary for epidemiological  and / or clinical management purposes  to differentiate between  SARS-CoV-2 and other Sarbecovirus currently known to infect humans.  If clinically indicated additional testing with an alternate test  methodology [phone]) is advised. The SARS-CoV-2 RNA is generally  detectable in upper and lower respiratory sp ecimens during the acute  phase of infection. The expected result is Negative. Fact Sheet for Patients:  StrictlyIdeas.no Fact Sheet for Healthcare Providers: BankingDealers.co.za This test is not yet approved or cleared by the [place] FDA and has been authorized for detection and/or diagnosis of SARS-CoV-2 by FDA under an Emergency Use Authorization (EUA).  This EUA will remain in effect (meaning this test can be used) for the duration of the COVID-19 declaration under Section 564(b)(1) of the Act, 21 U.S.C. section 360bbb-3(b)(1), unless the authorization is terminated or revoked sooner. Performed at [facility], [address], [place], [place] [address]  Troponin I (High Sensitivity)     Status: Abnormal   Collection Time: 05/27/19  6:41 AM  Result Value Ref Range   Troponin I (High Sensitivity) 78 (H) <18 ng/L    Comment: CRITICAL RESULT CALLED TO, READ BACK BY AND VERIFIED WITH STEPHANIE RUDD AT 1505 ON 05/27/2019 [place]. (NOTE) Elevated high sensitivity troponin I (hsTnI) values and significant  changes across serial measurements may suggest ACS but many other  chronic and acute conditions are known to elevate hsTnI results.  Refer to the "Links" section for chest pain algorithms and additional  guidance. Performed at [facility], [address] [place], [place] [address]   Magnesium     Status: None   Collection Time: 05/27/19  6:41 AM  Result Value Ref Range   Magnesium 2.1 1.7 - 2.4 mg/dL    Comment: Performed at [facility], [address] [place], [place] [address]  Glucose, capillary     Status: Abnormal   Collection Time: 05/27/19  8:20 AM  Result Value Ref Range   Glucose-Capillary 284 (H) 70 - 99 mg/dL   Comment 1 Notify RN    Comment 2 Document in Chart    Dg Chest Portable 1 View  Result Date: 05/27/2019 CLINICAL DATA:  Shortness of breath EXAM: PORTABLE CHEST 1 VIEW COMPARISON:  03/16/2019 FINDINGS: cardiomegaly and CABG. Biventricular pacer leads from the right in stable position. Diffuse interstitial opacity, vascular pedicle widening, and small pleural effusions. IMPRESSION: CHF. Electronically Signed   By: Monte Fantasia M.D.   On: 05/27/2019 04:51    Pending Labs Unresulted Labs (From admission, onward)    Start     Ordered   06/03/19 0500  Creatinine, serum  (enoxaparin (LOVENOX)    CrCl >/= 30 ml/min)  Weekly,   STAT    Comments: while on enoxaparin therapy    05/27/19 0630   05/28/19 5374  Basic metabolic panel  Daily,   STAT     05/27/19 0630   05/28/19 0500  CBC WITH DIFFERENTIAL  Daily,   STAT     05/27/19 0630           Vitals/Pain Today's Vitals   05/27/19 0730 05/27/19 0745 05/27/19 0800 05/27/19 0815  BP: (!) 146/101 (!) 142/96 (!) 150/103 (!) 160/98  Pulse: (!) 101 96 95 95  Resp: (!) 28 (!) 27 (!) 26 (!) 25  Temp:      TempSrc:      SpO2: 95% 96% 96% 96%  Weight:      Height:      PainSc:        Isolation Precautions No active isolations  Medications Medications  aspirin EC tablet 81 mg (has no administration in time range)  atorvastatin (LIPITOR) tablet 40 mg (has no administration in time range)  carvedilol (COREG) tablet 6.25 mg (has no administration in time range)  nitroGLYCERIN (NITROSTAT) SL tablet 0.4 mg (has no administration in time range)  sacubitril-valsartan (ENTRESTO) 24-26 mg per tablet (has no administration in time range)  ALPRAZolam (XANAX) tablet 0.25-0.5 mg (has no administration in time range)  escitalopram (LEXAPRO) tablet 10 mg (has no administration in time range)  traZODone (DESYREL) tablet 25-50 mg (has no administration in time range)  pantoprazole (PROTONIX) EC tablet 40 mg (has no administration in time range)  clopidogrel (PLAVIX) tablet 75 mg (has no administration in time range)  ferrous sulfate tablet 325 mg (has no administration in time range)  potassium chloride (K-DUR) CR tablet 10 mEq (  has no administration in time range)  ipratropium-albuterol (DUONEB) 0.5-2.5 (3) MG/3ML nebulizer solution 3 mL (has no administration in time range)  Tiotropium Bromide Monohydrate AERS 5 mcg (has no administration in time range)  sodium chloride flush (NS) 0.9 % injection 3 mL (has no administration in time range)  sodium chloride flush (NS) 0.9 % injection 3 mL (has no administration in time range)  0.9 %  sodium chloride infusion (has no administration in time range)  acetaminophen (TYLENOL) tablet 650 mg (has no administration in time range)  ondansetron (ZOFRAN) injection 4 mg (has no administration in time range)  enoxaparin (LOVENOX) injection 40 mg (has  no administration in time range)  furosemide (LASIX) injection 40 mg (has no administration in time range)  zolpidem (AMBIEN) tablet 5 mg (has no administration in time range)  insulin regular bolus via infusion 0-10 Units (has no administration in time range)  insulin regular, human (MYXREDLIN) 100 units/ 100 mL infusion (has no administration in time range)  dextrose 50 % solution 25 mL (has no administration in time range)  0.9 %  sodium chloride infusion (has no administration in time range)  potassium chloride (KLOR-CON) packet 40 mEq (has no administration in time range)  ipratropium-albuterol (DUONEB) 0.5-2.5 (3) MG/3ML nebulizer solution 3 mL (3 mLs Nebulization Given 05/27/19 0418)  magnesium sulfate IVPB 2 g 50 mL (0 g Intravenous Stopped 05/27/19 0503)  furosemide (LASIX) injection 40 mg (40 mg Intravenous Given 05/27/19 0449)  nitroGLYCERIN (NITROGLYN) 2 % ointment 1 inch (1 inch Topical Given 05/27/19 0448)  insulin aspart (novoLOG) injection 8 Units (8 Units Intravenous Given 05/27/19 7014)    Mobility walks Low fall risk   Focused Assessments    R Recommendations: See Admitting Provider Note  Report given to:   Additional Notes:

## 2019-05-27 NOTE — ED Notes (Addendum)
Attempted report. [facility] Network engineer reported they just got a new patient and could not take another one for a few minutes. Will try back

## 2019-05-27 NOTE — Consult Note (Signed)
Reason for Consult: Respiratory failure shortness of breath congestive heart failure Referring Physician: Dr. Derrel Nip hospitalist. Dr Eugenie Norrie Cardiologist Dr. [name] is an 68 y.o. female.  HPI: Patient is a 68 year old history of congestive heart failure  diastolic heart failure, shortness of breath dyspnea mild mitral regurgitation presented with shortness of breath dyspnea.  Complains of sputum production over the last 5 days presented with dyspnea respiratory failure requiring BiPAP.  Patient with eventually weaned from BiPAP and is still in ICU being treated for heart failure.  Patient denies any significant chest pain  Past Medical History:  Diagnosis Date  . AICD (automatic cardioverter/defibrillator) present    on right side  . Anemia   . CAD (coronary artery disease)    s/p CABG  . CHF (congestive heart failure) ([place])   . Chronic kidney disease    renal artery stenosis  . Colon cancer ([place])   . Depression   . GERD (gastroesophageal reflux disease)   . Hx of colonic polyps   . Hyperlipidemia   . Hypertension   . Mitral valve disorder    s/p mitral valve repair wth CABG  . Myocardial infarction ([place])   . Peripheral vascular disease ([place])   . Presence of permanent cardiac pacemaker    Pacemaker/ Defibrillator    Past Surgical History:  Procedure Laterality Date  . arm surgery     fracture, has plates and screws  . CABG with mitral valve repair    . CARDIAC CATHETERIZATION    . COLON SURGERY     colon cancer  . COLONOSCOPY WITH PROPOFOL N/A 10/09/2016   Procedure: COLONOSCOPY WITH PROPOFOL;  Surgeon: Jonathon Bellows, MD;  Location: [facility] ENDOSCOPY;  Service: Endoscopy;  Laterality: N/A;  . COLONOSCOPY WITH PROPOFOL N/A 07/24/2018   Procedure: COLONOSCOPY WITH PROPOFOL;  Surgeon: Virgel Manifold, MD;  Location: [facility] ENDOSCOPY;  Service: Endoscopy;  Laterality: N/A;  . CORONARY ARTERY BYPASS GRAFT    . ENTEROSCOPY N/A 11/21/2018   Procedure:  ENTEROSCOPY-BALLOON;  Surgeon: Jonathon Bellows, MD;  Location: [facility] ENDOSCOPY;  Service: Gastroenterology;  Laterality: N/A;  . ESOPHAGOGASTRODUODENOSCOPY (EGD) WITH PROPOFOL N/A 07/24/2018   Procedure: ESOPHAGOGASTRODUODENOSCOPY (EGD) WITH PROPOFOL;  Surgeon: Virgel Manifold, MD;  Location: [facility] ENDOSCOPY;  Service: Endoscopy;  Laterality: N/A;  . GIVENS CAPSULE STUDY N/A 08/16/2018   Procedure: GIVENS CAPSULE STUDY;  Surgeon: Virgel Manifold, MD;  Location: [facility] ENDOSCOPY;  Service: Endoscopy;  Laterality: N/A;  . pace maker defib  2016  . PERIPHERAL VASCULAR CATHETERIZATION N/A 10/23/2016   Procedure: Renal Angiography;  Surgeon: Algernon Huxley, MD;  Location: [facility];  Service: Cardiovascular;  Laterality: N/A;  . TOTAL ABDOMINAL HYSTERECTOMY  1999   history of abnormal pap    Family History  Problem Relation Age of Onset  . Arthritis Mother   . Cancer Mother        uterus cancer  . Hyperlipidemia Mother   . Hypertension Mother   . Heart disease Mother   . Diabetes Mother   . Hyperlipidemia Father   . Hypertension Father   . Heart disease Father   . Diabetes Father   . Cancer Sister        ovary cancer  . Diabetes Maternal Grandmother   . Hypertension Maternal Grandmother   . Arthritis Maternal Grandmother   . Hypertension Maternal Grandfather   . Hypertension Paternal Grandmother   . Hypertension Paternal Grandfather   . Heart disease Paternal Grandfather   .  Heart disease Brother   . Kidney disease Brother   . Breast cancer Neg Hx     Social History:  reports that she has been smoking cigarettes. She has been smoking about 0.50 packs per day. She has never used smokeless tobacco. She reports current alcohol use of about 2.0 standard drinks of alcohol per week. She reports that she does not use drugs.  Allergies:  Allergies  Allergen Reactions  . Hydrocodone-Acetaminophen Nausea Only    Medications: I have reviewed the patient's current  medications.  Results for orders placed or performed during the hospital encounter of 05/27/19 (from the past 48 hour(s))  Comprehensive metabolic panel     Status: Abnormal   Collection Time: 05/27/19  4:20 AM  Result Value Ref Range   Sodium 136 135 - 145 mmol/L    Comment: REPEATED ELECTROLYTES [place]   Potassium 3.4 (L) 3.5 - 5.1 mmol/L   Chloride 98 98 - 111 mmol/L   CO2 15 (L) 22 - 32 mmol/L   Glucose, Bld 437 (H) 70 - 99 mg/dL   BUN 12 8 - 23 mg/dL   Creatinine, Ser 1.32 (H) 0.44 - 1.00 mg/dL   Calcium 8.5 (L) 8.9 - 10.3 mg/dL   Total Protein 6.7 6.5 - 8.1 g/dL   Albumin 3.7 3.5 - 5.0 g/dL   AST 69 (H) 15 - 41 U/L   ALT 36 0 - 44 U/L   Alkaline Phosphatase 88 38 - 126 U/L   Total Bilirubin 2.2 (H) 0.3 - 1.2 mg/dL   GFR calc non Af Amer 42 (L) >60 mL/min   GFR calc Af Amer 49 (L) >60 mL/min   Anion gap 23 (H) 5 - 15    Comment: Performed at [facility], [address], [place], [place] [address]  Troponin I (High Sensitivity)     Status: Abnormal   Collection Time: 05/27/19  4:20 AM  Result Value Ref Range   Troponin I (High Sensitivity) 55 (H) <18 ng/L    Comment: (NOTE) Elevated high sensitivity troponin I (hsTnI) values and significant  changes across serial measurements may suggest ACS but many other  chronic and acute conditions are known to elevate hsTnI results.  Refer to the "Links" section for chest pain algorithms and additional  guidance. Performed at [facility], [address] [place], [place] [address]   Brain natriuretic peptide     Status: Abnormal   Collection Time: 05/27/19  4:20 AM  Result Value Ref Range   B Natriuretic Peptide 4,221.0 (H) 0.0 - 100.0 pg/mL    Comment: Performed at [facility], [place], [place] [address]  CBC with Differential     Status: Abnormal   Collection Time: 05/27/19  4:20 AM  Result Value Ref Range   WBC 16.4 (H) 4.0 - 10.5 K/uL   RBC 3.33 (L) 3.87 - 5.11 MIL/uL    Hemoglobin 11.9 (L) 12.0 - 15.0 g/dL   HCT 35.3 (L) 36.0 - 46.0 %   MCV 106.0 (H) 80.0 - 100.0 fL   MCH 35.7 (H) 26.0 - 34.0 pg   MCHC 33.7 30.0 - 36.0 g/dL   RDW 14.8 11.5 - 15.5 %   Platelets 336 150 - 400 K/uL   nRBC 0.2 0.0 - 0.2 %   Neutrophils Relative % 85 %   Neutro Abs 14.0 (H) 1.7 - 7.7 K/uL   Lymphocytes Relative 7 %   Lymphs Abs 1.1 0.7 - 4.0 K/uL  Monocytes Relative 6 %   Monocytes Absolute 0.9 0.1 - 1.0 K/uL   Eosinophils Relative 0 %   Eosinophils Absolute 0.1 0.0 - 0.5 K/uL   Basophils Relative 1 %   Basophils Absolute 0.1 0.0 - 0.1 K/uL   Immature Granulocytes 1 %   Abs Immature Granulocytes 0.13 (H) 0.00 - 0.07 K/uL    Comment: Performed at [facility], [address] [place], [place] [address]  SARS Coronavirus 2 (CEPHEID- Performed in [place] hospital lab), Hosp Order     Status: None   Collection Time: 05/27/19  5:16 AM   Specimen: Nasopharyngeal Swab  Result Value Ref Range   SARS Coronavirus 2 NEGATIVE NEGATIVE    Comment: (NOTE) If result is NEGATIVE SARS-CoV-2 target nucleic acids are NOT DETECTED. The SARS-CoV-2 RNA is generally detectable in upper and lower  respiratory specimens during the acute phase of infection. The lowest  concentration of SARS-CoV-2 viral copies this assay can detect is 250  copies / mL. A negative result does not preclude SARS-CoV-2 infection  and should not be used as the sole basis for treatment or other  patient management decisions.  A negative result may occur with  improper specimen collection / handling, submission of specimen other  than nasopharyngeal swab, presence of viral mutation(s) within the  areas targeted by this assay, and inadequate number of viral copies  (<250 copies / mL). A negative result must be combined with clinical  observations, patient history, and epidemiological information. If result is POSITIVE SARS-CoV-2 target nucleic acids are DETECTED. The SARS-CoV-2 RNA is generally  detectable in upper and lower  respiratory specimens dur ing the acute phase of infection.  Positive  results are indicative of active infection with SARS-CoV-2.  Clinical  correlation with patient history and other diagnostic information is  necessary to determine patient infection status.  Positive results do  not rule out bacterial infection or co-infection with other viruses. If result is PRESUMPTIVE POSTIVE SARS-CoV-2 nucleic acids MAY BE PRESENT.   A presumptive positive result was obtained on the submitted specimen  and confirmed on repeat testing.  While 2019 novel coronavirus  (SARS-CoV-2) nucleic acids may be present in the submitted sample  additional confirmatory testing may be necessary for epidemiological  and / or clinical management purposes  to differentiate between  SARS-CoV-2 and other Sarbecovirus currently known to infect humans.  If clinically indicated additional testing with an alternate test  methodology [phone]) is advised. The SARS-CoV-2 RNA is generally  detectable in upper and lower respiratory sp ecimens during the acute  phase of infection. The expected result is Negative. Fact Sheet for Patients:  StrictlyIdeas.no Fact Sheet for Healthcare Providers: BankingDealers.co.za This test is not yet approved or cleared by the [place] FDA and has been authorized for detection and/or diagnosis of SARS-CoV-2 by FDA under an Emergency Use Authorization (EUA).  This EUA will remain in effect (meaning this test can be used) for the duration of the COVID-19 declaration under Section 564(b)(1) of the Act, 21 U.S.C. section 360bbb-3(b)(1), unless the authorization is terminated or revoked sooner. Performed at [facility], [address] [place], [place] [address]   Troponin I (High Sensitivity)     Status: Abnormal   Collection Time: 05/27/19  6:41 AM  Result Value Ref Range   Troponin I (High  Sensitivity) 78 (H) <18 ng/L    Comment: CRITICAL RESULT CALLED TO, READ BACK BY AND VERIFIED WITH STEPHANIE RUDD AT 0347 ON 05/27/2019 [place]. (  NOTE) Elevated high sensitivity troponin I (hsTnI) values and significant  changes across serial measurements may suggest ACS but many other  chronic and acute conditions are known to elevate hsTnI results.  Refer to the "Links" section for chest pain algorithms and additional  guidance. Performed at [facility], [address] [place], [place] [address]   Magnesium     Status: None   Collection Time: 05/27/19  6:41 AM  Result Value Ref Range   Magnesium 2.1 1.7 - 2.4 mg/dL    Comment: Performed at [facility], [place], [place] [address]  Glucose, capillary     Status: Abnormal   Collection Time: 05/27/19  8:20 AM  Result Value Ref Range   Glucose-Capillary 284 (H) 70 - 99 mg/dL   Comment 1 Notify RN    Comment 2 Document in Chart   Glucose, capillary     Status: Abnormal   Collection Time: 05/27/19  9:12 AM  Result Value Ref Range   Glucose-Capillary 211 (H) 70 - 99 mg/dL  MRSA PCR Screening     Status: None   Collection Time: 05/27/19  9:28 AM   Specimen: Nasal Mucosa; Nasopharyngeal  Result Value Ref Range   MRSA by PCR NEGATIVE NEGATIVE    Comment:        The GeneXpert MRSA Assay (FDA approved for NASAL specimens only), is one component of a comprehensive MRSA colonization surveillance program. It is not intended to diagnose MRSA infection nor to guide or monitor treatment for MRSA infections. Performed at [facility], [address] [place], [place] [address]   Basic metabolic panel     Status: Abnormal   Collection Time: 05/27/19 10:12 AM  Result Value Ref Range   Sodium 140 135 - 145 mmol/L   Potassium 2.6 (LL) 3.5 - 5.1 mmol/L    Comment: CRITICAL RESULT CALLED TO, READ BACK BY AND VERIFIED WITH MEGAN SHEFFIELD AT 1046 ON 05/27/2019 [place].    Chloride 100 98 - 111 mmol/L    CO2 23 22 - 32 mmol/L   Glucose, Bld 244 (H) 70 - 99 mg/dL   BUN 12 8 - 23 mg/dL   Creatinine, Ser 1.26 (H) 0.44 - 1.00 mg/dL   Calcium 8.8 (L) 8.9 - 10.3 mg/dL   GFR calc non Af Amer 44 (L) >60 mL/min   GFR calc Af Amer 51 (L) >60 mL/min   Anion gap 17 (H) 5 - 15    Comment: Performed at [facility], [address] [place], [place] [address]  Glucose, capillary     Status: Abnormal   Collection Time: 05/27/19 10:27 AM  Result Value Ref Range   Glucose-Capillary 233 (H) 70 - 99 mg/dL  Glucose, capillary     Status: Abnormal   Collection Time: 05/27/19 11:12 AM  Result Value Ref Range   Glucose-Capillary 210 (H) 70 - 99 mg/dL  Glucose, capillary     Status: Abnormal   Collection Time: 05/27/19 12:09 PM  Result Value Ref Range   Glucose-Capillary 209 (H) 70 - 99 mg/dL  Glucose, capillary     Status: Abnormal   Collection Time: 05/27/19  1:08 PM  Result Value Ref Range   Glucose-Capillary 263 (H) 70 - 99 mg/dL  Glucose, capillary     Status: Abnormal   Collection Time: 05/27/19  2:01 PM  Result Value Ref Range   Glucose-Capillary 243 (H) 70 - 99 mg/dL  Glucose, capillary     Status: Abnormal  Collection Time: 05/27/19  3:56 PM  Result Value Ref Range   Glucose-Capillary 193 (H) 70 - 99 mg/dL    Dg Chest Portable 1 View  Result Date: 05/27/2019 CLINICAL DATA:  Shortness of breath EXAM: PORTABLE CHEST 1 VIEW COMPARISON:  03/16/2019 FINDINGS: cardiomegaly and CABG. Biventricular pacer leads from the right in stable position. Diffuse interstitial opacity, vascular pedicle widening, and small pleural effusions. IMPRESSION: CHF. Electronically Signed   By: Monte Fantasia M.Ashley.   On: 05/27/2019 04:51    Review of Systems  Constitutional: Positive for diaphoresis and malaise/fatigue.  HENT: Positive for congestion.   Eyes: Negative.   Respiratory: Positive for cough, shortness of breath and wheezing.   Cardiovascular: Positive for palpitations, orthopnea, leg  swelling and PND.  Gastrointestinal: Positive for heartburn.  Genitourinary: Negative.   Musculoskeletal: Negative.   Skin: Negative.   Neurological: Negative.   Endo/Heme/Allergies: Negative.   Psychiatric/Behavioral: Negative.    Blood pressure (!) 131/93, pulse 96, temperature 98 F (36.7 C), temperature source Axillary, resp. rate (!) 22, height 5\' 3"  (1.6 m), weight 76.5 kg, SpO2 94 %. Physical Exam  Nursing note and vitals reviewed. Constitutional: She is oriented to person, place, and time. She appears well-developed and well-nourished.  HENT:  Head: Normocephalic and atraumatic.  Eyes: Pupils are equal, round, and reactive to light. Conjunctivae and EOM are normal.  Neck: Normal range of motion. Neck supple.  Cardiovascular: Normal rate, regular rhythm and normal heart sounds.  Respiratory: Effort normal and breath sounds normal.  GI: Soft. Bowel sounds are normal.  Neurological: She is alert and oriented to person, place, and time. She has normal reflexes.  Skin: Skin is warm and dry.    Assessment/Plan: Acute congestive heart failure diastolic dysfunction Shortness of breath Respiratory failure Diabetes type 2 Hypertensive urgency Coronary artery bypass surgery Known coronary disease Obesity Smoking . Plan Agree with admission to ICU Continue respiratory support and wean oxygen when able Continue diuretic management Maintain hypertension control Agree with diabetes management with insulin Follow-up EKGs and troponins Replace potassium Echocardiogram may be helpful for further assessment Recommend conservative cardiac care at this point  Dwayne Ashley [place] 05/27/2019, 5:07 PM

## 2019-05-27 NOTE — ED Notes (Addendum)
Attempted report. No answer. 

## 2019-05-27 NOTE — Consult Note (Signed)
Name: Ashley [name] MRN: [id number] DOB: [birth year]     CONSULTATION DATE: 05/27/2019  CHIEF COMPLAINT:  Respiratory distress in the setting of acute on chronic CHF exacerbation.   HISTORY OF PRESENT ILLNESS:  68 year old Caucasian female with history of CHF, right side AICD, AMI in 2007, PAD, COPD, current everyday smoker, CKD, and depression admitted from the ED for further management of respiratory distress. Patient presented to the ED by EMS early this AM with 5 day history of worsening shortness of breath, which she was initially able to manage at home with her nebulizer treatments. She also was experiencing chest pain, which was worse when leaning forward, and constant even at rest, and improved with nitroglycerin. She has had these chest pains in the past, and they typically occur when she is dyspneic. She also was experiencing pre-syncopal symptoms along with her shortness of breath. In the ED, her O2 saturation was 93%, and she was started immediately on BiPAP. CXR indicated cardiomegaly, and small bilateral pleural effusions, and her BNP was elevated to 4221. Her initial potassium level was low normal at 3.4, which has since decreased to 2.6 about 6 hours later. Initial CBC indicated a leukocytosis, and elevated ANC. She was brought to the ICU for continued monitoring on BiPAP.   Previous ECHO c/w LVH Ef 96% Some diastolic dysfunction Mod MR   She is now stable on 2 L O2, and her shortness of breath has improved. Endorses current headache x 2 days that has improved with Tylenol. She denies current cough, though she does note a chronic cough productive of clear/yellow/brown sputum. She denies current chest pain, palpitations, fevers.    SIGNIFICANT EVENTS: As above.   PAST MEDICAL HISTORY :   has a past medical history of AICD (automatic cardioverter/defibrillator) present, Anemia, CAD (coronary artery disease), CHF (congestive heart failure) ([place]), Chronic kidney disease, Colon  cancer ([place]), Depression, GERD (gastroesophageal reflux disease), colonic polyps, Hyperlipidemia, Hypertension, Mitral valve disorder, Myocardial infarction [facility]), Peripheral vascular disease ([place]), and Presence of permanent cardiac pacemaker.  has a past surgical history that includes Total abdominal hysterectomy (1999); CABG with mitral valve repair; Colon surgery; arm surgery; pace maker defib (2016); Coronary artery bypass graft; Colonoscopy with propofol (N/A, 10/09/2016); Cardiac catheterization (N/A, 10/23/2016); Colonoscopy with propofol (N/A, 07/24/2018); Esophagogastroduodenoscopy (egd) with propofol (N/A, 07/24/2018); [place]ens capsule study (N/A, 08/16/2018); Cardiac catheterization; and enteroscopy (N/A, 11/21/2018). Prior to Admission medications   Medication Sig Start Date End Date Taking? Authorizing Provider  acetaminophen (TYLENOL) 500 MG tablet Maximum 6 tablets a day. Patient taking differently: Take 500 mg by mouth every 4 (four) hours as needed for fever. Maximum 6 tablets a day. 02/09/15  Yes Doss, Velora Heckler, RN  ADVAIR DISKUS 250-50 MCG/DOSE AEPB INHALE 1 PUFF BY MOUTH INTO THE LUNGS EVERY 12 HOURS RINSE MOUTH AFTER EACH USE 11/26/18  Yes [name], MD  ALPRAZolam Duanne Moron) 0.5 MG tablet Take 0.5-1 tablets (0.25-0.5 mg total) by mouth at bedtime. TAKE ONE TABLET BY MOUTH AT BEDTIME AS NEEDED FOR ANXIETY 02/24/19  Yes [name], MD  aspirin 81 MG tablet Take 81 mg by mouth daily.   Yes [provider]  atorvastatin (LIPITOR) 40 MG tablet TAKE ONE TABLET BY MOUTH ONCE DAILY 05/07/19  Yes [name], MD  carvedilol (COREG) 6.25 MG tablet TAKE ONE TABLET BY MOUTH TWICE DAILY WITH MEAL 05/07/19  Yes [name], MD  clopidogrel (PLAVIX) 75 MG tablet TAKE ONE TABLET BY MOUTH ONCE DAILY  05/16/19  Yes [name], MD  cyanocobalamin (,VITAMIN B-12,) 1000 MCG/ML injection Inject 1 mL weekly into the muscle monthly 11/08/18  Yes [name], MD  diphenhydrAMINE  (DIPHENHIST) 25 mg capsule Take 25 mg by mouth at bedtime as needed.    Yes [provider]  escitalopram (LEXAPRO) 10 MG tablet TAKE ONE TABLET BY MOUTH ONCE DAILY 05/07/19  Yes [name], MD  ferrous sulfate 325 (65 FE) MG EC tablet Take 325 mg by mouth daily with breakfast.   Yes [provider]  furosemide (LASIX) 40 MG tablet Take 1 tablet (40 mg total) by mouth daily. 2 for weight gain > 2 lbs overnight 11/08/18  Yes [name], MD  ipratropium-albuterol (DUONEB) 0.5-2.5 (3) MG/3ML SOLN Take 3 mLs by nebulization every 6 (six) hours as needed. 08/05/18  Yes [name], MD  omeprazole (PRILOSEC) 40 MG capsule TAKE ONE CAPSULE BY MOUTH ONCE DAILY 05/07/19  Yes [name], MD  potassium chloride (K-DUR) 10 MEQ tablet TAKE ONE TABLET BY MOUTH ONCE DAILY 05/07/19  Yes [name], MD  sacubitril-valsartan (ENTRESTO) 24-26 MG Take 1 tablet by mouth 2 (two) times daily. 05/07/19  Yes Hackney, Otila Kluver A, FNP  Tiotropium Bromide Monohydrate (SPIRIVA RESPIMAT) 2.5 MCG/ACT AERS Inhale 5 mcg into the lungs every morning. 05/07/19  Yes [name], MD  traZODone (DESYREL) 50 MG tablet Take 0.5-1 tablets (25-50 mg total) by mouth daily. One hour before bedtime 05/07/19  Yes [name], MD  VENTOLIN HFA 108 (90 Base) MCG/ACT inhaler INHALE 2 PUFFS BY MOUTH INTO THE LUNGS EVERY 6 HOURS AS NEEDED FOR WHEEZING 09/09/18  Yes [name], MD  nitroGLYCERIN (NITROSTAT) 0.4 MG SL tablet Place 1 tablet (0.4 mg total) under the tongue every 5 (five) minutes as needed for chest pain. 06/19/16   [name], MD   Allergies  Allergen Reactions   Hydrocodone-Acetaminophen Nausea Only   FAMILY HISTORY:  family history includes Arthritis in her maternal grandmother and mother; Cancer in her mother and sister; Diabetes in her father, maternal grandmother, and mother; Heart disease in her brother, father, mother, and paternal grandfather; Hyperlipidemia in her father and mother;  Hypertension in her father, maternal grandfather, maternal grandmother, mother, paternal grandfather, and paternal grandmother; Kidney disease in her brother. SOCIAL HISTORY:  reports that she has been smoking cigarettes. She has been smoking about 0.50 packs per day. She has never used smokeless tobacco. She reports current alcohol use of about 2.0 standard drinks of alcohol per week. She reports that she does not use drugs.  REVIEW OF SYSTEMS:   Gen: Endorses fatigue. Denies  fever, sweats, chills, weight loss. HEENT: Denies blurred vision, double vision, ear pain, eye pain, hearing loss, sore throat Cardiac:  No dizziness, chest pain or heaviness, chest tightness, edema, JVD, intermittent claudication, diaphoresis. Resp: Endorses shortness of breath - improved. Denies current cough, sputum production, wheezing. Gi: Denies stomach pain, nausea or vomiting, diarrhea, constipation, bowel incontinence. Does have chronic occasional diarrhea, which is not present today. Gu:  Denies dysuria. Ext:   Denies Joint pain, stiffness or swelling Skin: Denies  skin rash, easy bruising or bleeding or hives Endoc:  Endorses polydipsia. Denies polyuria, polyphagia or weight changes Other:  All other systems negative  VITAL SIGNS: Temp:  [97.6 F (36.4 C)-97.7 F (36.5 C)] 97.6 F (36.4 C) (07/21 0900) Pulse Rate:  [63-137] 88 (07/21 1300) Resp:  [18-48] 24 (07/21 1300) BP: (109-169)/(67-131) 112/67 (  07/21 1300) SpO2:  [93 %-100 %] 96 % (07/21 1300) FiO2 (%):  [50 %] 50 % (07/21 0428) Weight:  [76.2 kg-76.5 kg] 76.5 kg (07/21 0900)  I/O last 3 completed shifts: In: 76 [IV Piggyback:50] Out: -  Total I/O In: 169.9 [P.O.:160; I.V.:9.9] Out: 350 [Urine:350]  SpO2: 96 % O2 Flow Rate (L/min): 2 L/min FiO2 (%): 50 %  Physical Examination:  GENERAL: Caucasian female, in respiratory distress. Non-cachectic.  HEAD: Normocephalic, atraumatic.  EYES: Pupils equal, round, reactive to light.  No  scleral icterus.  MOUTH: Moist mucosal membrane.  NECK: Supple. No JVD. No LAD.  PULMONARY: + rhonchi (cleared with coughing). Diffuse bilateral expiratory wheezing. Tactile fremitus and egophony negative. CARDIOVASCULAR: S1 and S2. Regular rate and rhythm. No murmurs, rubs, or gallops.  GASTROINTESTINAL: Soft, nontender, -distended. No masses. Positive bowel sounds. No hepatosplenomegaly.  EXTREMITIES:  Lower extremities: No edema, capillary refil < 3 seconds. DP pulses 2+ bilaterally Upper extremities: Capillary refill < 3 seconds. Radial pulses 2+ bilaterally.  NEUROLOGIC: Alert and oriented x 4 SKIN: intact, warm, dry  I personally reviewed lab work that was obtained in last 24 hrs. CBC Latest Ref Rng & Units 05/27/2019 03/16/2019 11/08/2018  WBC 4.0 - 10.5 K/uL 16.4(H) 8.3 7.7  Hemoglobin 12.0 - 15.0 g/dL 11.9(L) 13.5 13.7  Hematocrit 36.0 - 46.0 % 35.3(L) 39.3 41.2  Platelets 150 - 400 K/uL 336 319 259.0   BMP Latest Ref Rng & Units 05/27/2019 05/27/2019 03/17/2019  Glucose 70 - 99 mg/dL 244(H) 437(H) 197(H)  BUN 8 - 23 mg/dL 12 12 25(H)  Creatinine 0.44 - 1.00 mg/dL 1.26(H) 1.32(H) 1.27(H)  BUN/Creat Ratio 6 - 22 (calc) - - -  Sodium 135 - 145 mmol/L 140 136 143  Potassium 3.5 - 5.1 mmol/L 2.6(LL) 3.4(L) 3.3(L)  Chloride 98 - 111 mmol/L 100 98 106  CO2 22 - 32 mmol/L 23 15(L) 24  Calcium 8.9 - 10.3 mg/dL 8.8(L) 8.5(L) 8.6(L)   MEDICATIONS: I have reviewed all medications and confirmed regimen as documented   CULTURE RESULTS   Recent Results (from the past 240 hour(s))  SARS Coronavirus 2 (CEPHEID- Performed in [place] hospital lab), Hosp Order     Status: None   Collection Time: 05/27/19  5:16 AM   Specimen: Nasopharyngeal Swab  Result Value Ref Range Status   SARS Coronavirus 2 NEGATIVE NEGATIVE Final    Comment: (NOTE) If result is NEGATIVE SARS-CoV-2 target nucleic acids are NOT DETECTED. The SARS-CoV-2 RNA is generally detectable in upper and lower  respiratory  specimens during the acute phase of infection. The lowest  concentration of SARS-CoV-2 viral copies this assay can detect is 250  copies / mL. A negative result does not preclude SARS-CoV-2 infection  and should not be used as the sole basis for treatment or other  patient management decisions.  A negative result may occur with  improper specimen collection / handling, submission of specimen other  than nasopharyngeal swab, presence of viral mutation(s) within the  areas targeted by this assay, and inadequate number of viral copies  (<250 copies / mL). A negative result must be combined with clinical  observations, patient history, and epidemiological information. If result is POSITIVE SARS-CoV-2 target nucleic acids are DETECTED. The SARS-CoV-2 RNA is generally detectable in upper and lower  respiratory specimens dur ing the acute phase of infection.  Positive  results are indicative of active infection with SARS-CoV-2.  Clinical  correlation with patient history and other diagnostic information is  necessary to determine patient infection status.  Positive results do  not rule out bacterial infection or co-infection with other viruses. If result is PRESUMPTIVE POSTIVE SARS-CoV-2 nucleic acids MAY BE PRESENT.   A presumptive positive result was obtained on the submitted specimen  and confirmed on repeat testing.  While 2019 novel coronavirus  (SARS-CoV-2) nucleic acids may be present in the submitted sample  additional confirmatory testing may be necessary for epidemiological  and / or clinical management purposes  to differentiate between  SARS-CoV-2 and other Sarbecovirus currently known to infect humans.  If clinically indicated additional testing with an alternate test  methodology [phone]) is advised. The SARS-CoV-2 RNA is generally  detectable in upper and lower respiratory sp ecimens during the acute  phase of infection. The expected result is Negative. Fact Sheet for  Patients:  StrictlyIdeas.no Fact Sheet for Healthcare Providers: BankingDealers.co.za This test is not yet approved or cleared by the [place] FDA and has been authorized for detection and/or diagnosis of SARS-CoV-2 by FDA under an Emergency Use Authorization (EUA).  This EUA will remain in effect (meaning this test can be used) for the duration of the COVID-19 declaration under Section 564(b)(1) of the Act, 21 U.S.C. section 360bbb-3(b)(1), unless the authorization is terminated or revoked sooner. Performed at [facility], [address] [place], [place] [address]   MRSA PCR Screening     Status: None   Collection Time: 05/27/19  9:28 AM   Specimen: Nasal Mucosa; Nasopharyngeal  Result Value Ref Range Status   MRSA by PCR NEGATIVE NEGATIVE Final    Comment:        The GeneXpert MRSA Assay (FDA approved for NASAL specimens only), is one component of a comprehensive MRSA colonization surveillance program. It is not intended to diagnose MRSA infection nor to guide or monitor treatment for MRSA infections. Performed at [facility], [address] [place], [place] [address]           IMAGING   Dg Chest Portable 1 View  Result Date: 05/27/2019 CLINICAL DATA:  Shortness of breath EXAM: PORTABLE CHEST 1 VIEW COMPARISON:  03/16/2019 FINDINGS: cardiomegaly and CABG. Biventricular pacer leads from the right in stable position. Diffuse interstitial opacity, vascular pedicle widening, and small pleural effusions. IMPRESSION: CHF. Electronically Signed   By: Monte Fantasia M.D.   On: 05/27/2019 04:51     CXR Independently reviewed-by me today B/l opacities effusions  External female Urinary Catheter continued, requirement due to   Reason to continue Urinary Catheter strict Intake/Output monitoring for hemodynamic instability    ASSESSMENT AND PLAN SYNOPSIS: 68 year old Caucasian female with multiple  comorbidities with respiratory distress, greatly improved with use of BiPAP. Condition has improved greatly and patient now requires O2 via [place].   ACUTE DIASTOLIC CARDIAC FAILURE - Improving - Patient with no signs or symptoms of fluid overload, other than orthopnea and evidence of small pleural effusions on lungs. However, she does remain short of breath.  - Will order an ECHO for further evaluation of acute decompensation - Lasix as tolerated - follow up cardiac enzymes as indicated - follow up cardiology recs  Severe ACUTE Hypoxic Respiratory Failure - Improving - As above, patient had required BiPAP upon admission to ICU. She is now stable on 2 L of O2 via nasal canula. - Continue nebulizer treatments. - BiPAP PRN.  Elevated blood glucose - Upon admission to ED, patient blood glucose at 284. She complains of polydypsia, and has not been diagnosed  with diabetes.  - She has been started on insulin aspart and glargine. Will continue and consider discontinuing if A1c unremarkable.  - A1c completed and pending.   ELECTROLYTES - Hypokalemia on admission currently managed with PO potassium. Will continue with PO Potassium 40 meq TID.  - follow labs as needed - replace as needed - pharmacy consultation and following  CARDIAC ICU monitoring  GI GI PROPHYLAXIS as indicated  NUTRITIONAL STATUS DIET--> as tolerated Constipation protocol as indicated ENDO - will use ICU hypoglycemic\Hyperglycemia protocol if needed    DVT/GI PRX ordered TRANSFUSIONS AS NEEDED MONITOR FSBS ASSESS the need for LABS     Corrin Parker, M.D.  [facility]  Medical Director [place] Director [place] Department

## 2019-05-27 NOTE — Consult Note (Signed)
Name: Ashley [name] MRN: [id number] DOB: 12/15/1950    ADMISSION DATE:  05/27/2019 CONSULTATION DATE: 05/27/2019  REFERRING MD : Dr. Sidney Ace  CHIEF COMPLAINT: Shortness of Breath   BRIEF PATIENT DESCRIPTION:  68 yo female admitted with acute respiratory failure secondary to acute CHF exacerbation requiring Bipap   SIGNIFICANT EVENTS/STUDIES:  07/21-Pt admitted to the stepdown unit on Bipap  HISTORY OF PRESENT ILLNESS:   This is 68 yo female with a PMH of Pacemaker, AICD, PVD, Myocardial Infarction, CAD s/p CABG, HTN, Hyperlipidemia, GERD, Colonic Polyps, Depression, COPD, Colon Cancer, CKD, Renal Artery Stenosis, CHF, and Anemia.  She presented to [facility] ER via EMS on 07/21 with c/o shortness of breath.  Per ER notes pt reported she attempted to treat her symptoms with nebulizer treatments, however symptoms persisted prompting EMS notification.  Upon EMS arrival she received duonebs x2, 125 mg iv solumedrol, and required NRB for transport due to hypoxia (O2 sats 89% on RA).  Upon arrival to the ER pt remained in severe respiratory distress in tripod position and reported chest pain.  Therefore, pt transitioned from NRB to [place].  Lab results revealed CO2 15, glucose 437, creatinine 1.32, anion gap 23, AST 69, BNP 4,221, troponin 55, and wbc 16.4.  CXR concerning for pulmonary edema.  Pt received 40 mg iv lasix, duoneb, 2g magnesium, and 1 inch nitropaste.  She was subsequently admitted to the stepdown unit by hospitalist team for additional workup and treatment.    PAST MEDICAL HISTORY :   has a past medical history of AICD (automatic cardioverter/defibrillator) present, Anemia, CAD (coronary artery disease), CHF (congestive heart failure) ([place]), Chronic kidney disease, Colon cancer ([place]), COPD (chronic obstructive pulmonary disease) ([place]), Depression, Dyspnea, GERD (gastroesophageal reflux disease), colonic polyps, Hyperlipidemia, Hypertension, Mitral valve disorder, Myocardial infarction  [facility]), Peripheral vascular disease ([place]), and Presence of permanent cardiac pacemaker.  has a past surgical history that includes Total abdominal hysterectomy (1999); CABG with mitral valve repair; Colon surgery; arm surgery; pace maker defib (2016); Coronary artery bypass graft; Colonoscopy with propofol (N/A, 10/09/2016); Cardiac catheterization (N/A, 10/23/2016); Colonoscopy with propofol (N/A, 07/24/2018); Esophagogastroduodenoscopy (egd) with propofol (N/A, 07/24/2018); [place] capsule study (N/A, 08/16/2018); Cardiac catheterization; and enteroscopy (N/A, 11/21/2018). Prior to Admission medications   Medication Sig Start Date End Date Taking? Authorizing Provider  acetaminophen (TYLENOL) 500 MG tablet Maximum 6 tablets a day. Patient taking differently: Take 500 mg by mouth every 4 (four) hours as needed for fever. Maximum 6 tablets a day. 02/09/15  Yes Doss, Velora Heckler, RN  ADVAIR DISKUS 250-50 MCG/DOSE AEPB INHALE 1 PUFF BY MOUTH INTO THE LUNGS EVERY 12 HOURS RINSE MOUTH AFTER EACH USE 11/26/18  Yes [name], MD  ALPRAZolam Duanne Moron) 0.5 MG tablet Take 0.5-1 tablets (0.25-0.5 mg total) by mouth at bedtime. TAKE ONE TABLET BY MOUTH AT BEDTIME AS NEEDED FOR ANXIETY 02/24/19  Yes [name], MD  aspirin 81 MG tablet Take 81 mg by mouth daily.   Yes [provider]  atorvastatin (LIPITOR) 40 MG tablet TAKE ONE TABLET BY MOUTH ONCE DAILY 05/07/19  Yes [name], MD  carvedilol (COREG) 6.25 MG tablet TAKE ONE TABLET BY MOUTH TWICE DAILY WITH MEAL 05/07/19  Yes [name], MD  clopidogrel (PLAVIX) 75 MG tablet TAKE ONE TABLET BY MOUTH ONCE DAILY 05/16/19  Yes [name], MD  cyanocobalamin (,VITAMIN B-12,) 1000 MCG/ML injection Inject 1 mL weekly into the muscle monthly 11/08/18  Yes [name], MD  diphenhydrAMINE (  DIPHENHIST) 25 mg capsule Take 25 mg by mouth at bedtime as needed.    Yes [provider]  escitalopram (LEXAPRO) 10 MG tablet TAKE ONE TABLET BY MOUTH ONCE  DAILY 05/07/19  Yes [name], MD  ferrous sulfate 325 (65 FE) MG EC tablet Take 325 mg by mouth daily with breakfast.   Yes [provider]  furosemide (LASIX) 40 MG tablet Take 1 tablet (40 mg total) by mouth daily. 2 for weight gain > 2 lbs overnight 11/08/18  Yes [name], MD  ipratropium-albuterol (DUONEB) 0.5-2.5 (3) MG/3ML SOLN Take 3 mLs by nebulization every 6 (six) hours as needed. 08/05/18  Yes [name], MD  omeprazole (PRILOSEC) 40 MG capsule TAKE ONE CAPSULE BY MOUTH ONCE DAILY 05/07/19  Yes [name], MD  potassium chloride (K-DUR) 10 MEQ tablet TAKE ONE TABLET BY MOUTH ONCE DAILY 05/07/19  Yes [name], MD  sacubitril-valsartan (ENTRESTO) 24-26 MG Take 1 tablet by mouth 2 (two) times daily. 05/07/19  Yes Hackney, Otila Kluver A, FNP  Tiotropium Bromide Monohydrate (SPIRIVA RESPIMAT) 2.5 MCG/ACT AERS Inhale 5 mcg into the lungs every morning. 05/07/19  Yes [name], MD  traZODone (DESYREL) 50 MG tablet Take 0.5-1 tablets (25-50 mg total) by mouth daily. One hour before bedtime 05/07/19  Yes [name], MD  VENTOLIN HFA 108 (90 Base) MCG/ACT inhaler INHALE 2 PUFFS BY MOUTH INTO THE LUNGS EVERY 6 HOURS AS NEEDED FOR WHEEZING 09/09/18  Yes [name], MD  nitroGLYCERIN (NITROSTAT) 0.4 MG SL tablet Place 1 tablet (0.4 mg total) under the tongue every 5 (five) minutes as needed for chest pain. 06/19/16   [name], MD   Allergies  Allergen Reactions   Hydrocodone-Acetaminophen Nausea Only    FAMILY HISTORY:  family history includes Arthritis in her maternal grandmother and mother; Cancer in her mother and sister; Diabetes in her father, maternal grandmother, and mother; Heart disease in her brother, father, mother, and paternal grandfather; Hyperlipidemia in her father and mother; Hypertension in her father, maternal grandfather, maternal grandmother, mother, paternal grandfather, and paternal grandmother; Kidney disease in her brother. SOCIAL  HISTORY:  reports that she has been smoking cigarettes. She has been smoking about 0.50 packs per day. She has never used smokeless tobacco. She reports current alcohol use of about 2.0 standard drinks of alcohol per week. She reports that she does not use drugs.  REVIEW OF SYSTEMS:   Constitutional: Negative for fever, chills, weight loss, malaise/fatigue and diaphoresis.  HENT: Negative for hearing loss, ear pain, nosebleeds, congestion, sore throat, neck pain, tinnitus and ear discharge.   Eyes: Negative for blurred vision, double vision, photophobia, pain, discharge and redness.  Respiratory: Negative for cough, hemoptysis, sputum production, shortness of breath, wheezing and stridor.   Cardiovascular: Negative for chest pain, palpitations, orthopnea, claudication, leg swelling and PND.  Gastrointestinal: Negative for heartburn, nausea, vomiting, abdominal pain, diarrhea, constipation, blood in stool and melena.  Genitourinary: Negative for dysuria, urgency, frequency, hematuria and flank pain.  Musculoskeletal: Negative for myalgias, back pain, joint pain and falls.  Skin: Negative for itching and rash.  Neurological: Negative for dizziness, tingling, tremors, sensory change, speech change, focal weakness, seizures, loss of consciousness, weakness and headaches.  Endo/Heme/Allergies: Negative for environmental allergies and polydipsia. Does not bruise/bleed easily.  SUBJECTIVE:   VITAL SIGNS: Temp:  [97.7 F (36.5 C)] 97.7 F (36.5 C) (07/21 0413) Pulse Rate:  [63-137] 119 (07/21 0600) Resp:  [27-48] 32 (07/21  0600) BP: (153-169)/(97-131) 168/106 (07/21 0600) SpO2:  [93 %-100 %] 95 % (07/21 0600) FiO2 (%):  [50 %] 50 % (07/21 0428) Weight:  [76.2 kg] 76.2 kg (07/21 0417)  PHYSICAL EXAMINATION: General: Comfortable on BiPAP.  Transitioned to [place] O2 and remains comfortable. Neuro: CNs intact, motor sensory intact, DTR symmetric HEENT: NCAT, sclerae white Cardiovascular: Paced,  regular, no M noted Lungs: Few scattered wheezes, bibasilar crackles Abdomen: Soft, + BS, no palpable masses Extremities: Warm, no edema Skin: No lesions noted  Recent Labs  Lab 05/27/19 0420  NA 136  K 3.4*  CL 98  CO2 15*  BUN 12  CREATININE 1.32*  GLUCOSE 437*   Recent Labs  Lab 05/27/19 0420  HGB 11.9*  HCT 35.3*  WBC 16.4*  PLT 336   Dg Chest Portable 1 View  Result Date: 05/27/2019 CLINICAL DATA:  Shortness of breath EXAM: PORTABLE CHEST 1 VIEW COMPARISON:  03/16/2019 FINDINGS: cardiomegaly and CABG. Biventricular pacer leads from the right in stable position. Diffuse interstitial opacity, vascular pedicle widening, and small pleural effusions. IMPRESSION: CHF. Electronically Signed   By: Monte Fantasia M.D.   On: 05/27/2019 04:51    ASSESSMENT: Acute hypoxemic respiratory failure Pulmonary edema. Markedly elevated BNP Smoker - presently approx 6 cigs/d PFTs from 2015 do not show obstruction Mild bronchospasm on chronic bronchodilator therapy  Current wheezing might be "cardiac asthma" Hypokalemia due to loop diuresis Prior documented history of hypokalemia CAD CHF Hyperglycemia without previous diagnosis of DM   PLAN: Continue BiPAP as needed Continue supplemental oxygen as needed Nebulized steroids and bronchodilators ordered Avoid systemic steroids for now given hyperglycemia and minimal wheezing Continue diuresis as permitted by BP and renal function Monitor BMET intermittently Monitor I/Os Correct electrolytes as indicated Replete K+ aggressively Cardiology has seen patient - note pending SDU status through today Counseled her regarding need for smoking cessation  Merton Border, MD PCCM service Mobile [phone] Pager [phone] 05/27/2019 1:55 PM

## 2019-05-27 NOTE — ED Provider Notes (Addendum)
[facility] Emergency Department Provider Note   ____________________________________________   First MD Initiated Contact with Patient 05/27/19 0413     (approximate)  I have reviewed the triage vital signs and the nursing notes.   HISTORY  Chief Complaint Respiratory Distress History limited by respiratory distress   HPI Ashley [name] is a 68 y.o. female who is been short of breath for 3 days got worse today.  EMS reports O2 sat of 89% on room air on their arrival.  9495 100% nonrebreather and 100% on CPAP.  Patient arrives on 100% nonrebreather tripoding.  Patient reports some chest pain earlier.  Seem to be tight difficult to tell.         Past Medical History:  Diagnosis Date  . AICD (automatic cardioverter/defibrillator) present    on right side  . Anemia   . CAD (coronary artery disease)    s/p CABG  . CHF (congestive heart failure) ([place])   . Chronic kidney disease    renal artery stenosis  . Colon cancer ([place])   . COPD (chronic obstructive pulmonary disease) ([place])   . Depression   . Dyspnea   . GERD (gastroesophageal reflux disease)   . Hx of colonic polyps   . Hyperlipidemia   . Hypertension   . Mitral valve disorder    s/p mitral valve repair wth CABG  . Myocardial infarction ([place])   . Peripheral vascular disease ([place])   . Presence of permanent cardiac pacemaker    Pacemaker/ Defibrillator    Patient Active Problem List   Diagnosis Date Noted  . Insomnia 05/07/2019  . Hypokalemia 03/23/2019  . Hyperglycemia, drug-induced 03/23/2019  . Covid-19 Virus not Detected 03/23/2019  . Respiratory failure ([place]) 03/16/2019  . AVM (arteriovenous malformation) of small bowel, acquired   . Anemia, iron deficiency 11/10/2018  . Chronic systolic heart failure ([place]) 11/06/2018  . Rectal polyp   . Benign neoplasm of cecum   . Barrett's esophagus without dysplasia   . Stomach irritation   . Chronic diastolic heart failure ([place])  07/03/2018  . HTN (hypertension) 07/03/2018  . 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 B12 deficiency anemia 06/28/2018  . Hypotension 05/11/2018  . Prediabetes 05/11/2018  . Personal history of colon cancer   . Benign neoplasm of descending colon   . Polyp of sigmoid colon   . Benign neoplasm of transverse colon   . Diverticulosis of large intestine without diverticulitis   . 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 Renal artery stenosis ([place]) 10/03/2016  . 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 Hospital discharge follow-up 02/09/2015  . Cough in adult 10/21/2014  . Tobacco abuse 10/11/2014  . Tobacco abuse counseling 10/11/2014  . Chronic right hip pain 08/26/2014  . Atherosclerosis of native artery of extremity with intermittent claudication ([place]) 06/18/2013  . Preoperative evaluation to rule out surgical contraindication 06/18/2013  . CAD (coronary artery disease) 06/01/2013  . GERD (gastroesophageal reflux disease) 06/01/2013  . Hypercholesterolemia 06/01/2013  . Tubular adenoma of colon 06/01/2013  . 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 arm surgery     fracture, has plates and screws  . CABG with mitral valve repair    . CARDIAC CATHETERIZATION    . COLON SURGERY     colon cancer  . COLONOSCOPY WITH PROPOFOL N/A 10/09/2016   Procedure: COLONOSCOPY WITH PROPOFOL;  Surgeon: Jonathon Bellows, MD;  Location: [facility]  ENDOSCOPY;  Service: Endoscopy;  Laterality: N/A;  . COLONOSCOPY WITH PROPOFOL N/A 07/24/2018   Procedure: COLONOSCOPY WITH PROPOFOL;  Surgeon: Virgel Manifold, MD;  Location: [facility] ENDOSCOPY;  Service: Endoscopy;  Laterality: N/A;  . CORONARY ARTERY BYPASS GRAFT    . ENTEROSCOPY N/A 11/21/2018   Procedure: ENTEROSCOPY-BALLOON;  Surgeon: Jonathon Bellows, MD;  [facility] ENDOSCOPY;  Service: Gastroenterology;  Laterality: N/A;  . ESOPHAGOGASTRODUODENOSCOPY  (EGD) WITH PROPOFOL N/A 07/24/2018   Procedure: ESOPHAGOGASTRODUODENOSCOPY (EGD) WITH PROPOFOL;  Surgeon: Virgel Manifold, MD;  Location: [facility] ENDOSCOPY;  Service: Endoscopy;  Laterality: N/A;  . GIVENS CAPSULE STUDY N/A 08/16/2018   Procedure: GIVENS CAPSULE STUDY;  Surgeon: Virgel Manifold, MD;  Location: [facility] ENDOSCOPY;  Service: Endoscopy;  Laterality: N/A;  . pace maker defib  2016  . PERIPHERAL VASCULAR CATHETERIZATION N/A 10/23/2016   Procedure: Renal Angiography;  Surgeon: Algernon Huxley, MD;  Location: [place] CV LAB;  Service: Cardiovascular;  Laterality: N/A;  . TOTAL ABDOMINAL HYSTERECTOMY  1999   history of abnormal pap    Prior to Admission medications   Medication Sig Start Date End Date Taking? Authorizing Provider  acetaminophen (TYLENOL) 500 MG tablet Maximum 6 tablets a day. Patient taking differently: Take 500 mg by mouth every 4 (four) hours as needed for fever. Maximum 6 tablets a day. 02/09/15   Rubbie Battiest, RN  ADVAIR DISKUS 250-50 MCG/DOSE AEPB INHALE 1 PUFF BY MOUTH INTO THE LUNGS EVERY 12 HOURS RINSE MOUTH AFTER EACH USE 11/26/18   [name], MD  ALPRAZolam Duanne Moron) 0.5 MG tablet Take 0.5-1 tablets (0.25-0.5 mg total) by mouth at bedtime. TAKE ONE TABLET BY MOUTH AT BEDTIME AS NEEDED FOR ANXIETY 02/24/19   [name], MD  aspirin 81 MG tablet Take 81 mg by mouth daily.    [provider]  atorvastatin (LIPITOR) 40 MG tablet TAKE ONE TABLET BY MOUTH ONCE DAILY 05/07/19   [name], MD  carvedilol (COREG) 6.25 MG tablet TAKE ONE TABLET BY MOUTH TWICE DAILY WITH MEAL 05/07/19   [name], MD  clopidogrel (PLAVIX) 75 MG tablet TAKE ONE TABLET BY MOUTH ONCE DAILY 05/16/19   [name], MD  cyanocobalamin (,VITAMIN B-12,) 1000 MCG/ML injection Inject 1 mL weekly into the muscle monthly 11/08/18   [name], MD  diphenhydrAMINE (DIPHENHIST) 25 mg capsule Take 25 mg by mouth at bedtime.    [provider]  escitalopram  (LEXAPRO) 10 MG tablet TAKE ONE TABLET BY MOUTH ONCE DAILY 05/07/19   [name], MD  ferrous sulfate 325 (65 FE) MG EC tablet Take 325 mg by mouth daily with breakfast.    [provider]  furosemide (LASIX) 40 MG tablet Take 1 tablet (40 mg total) by mouth daily. 2 for weight gain > 2 lbs overnight 11/08/18   [name], MD  ipratropium-albuterol (DUONEB) 0.5-2.5 (3) MG/3ML SOLN Take 3 mLs by nebulization every 6 (six) hours as needed. 08/05/18   [name], MD  nitroGLYCERIN (NITROSTAT) 0.4 MG SL tablet Place 1 tablet (0.4 mg total) under the tongue every 5 (five) minutes as needed for chest pain. 06/19/16   [name], MD  omeprazole (PRILOSEC) 40 MG capsule TAKE ONE CAPSULE BY MOUTH ONCE DAILY 05/07/19   [name], MD  potassium chloride (K-DUR) 10 MEQ tablet TAKE ONE TABLET BY MOUTH ONCE DAILY 05/07/19   [name], MD  sacubitril-valsartan (ENTRESTO) 24-26 MG Take 1 tablet by  mouth 2 (two) times daily. 05/07/19   Alisa Graff, FNP  Tiotropium Bromide Monohydrate (SPIRIVA RESPIMAT) 2.5 MCG/ACT AERS Inhale 5 mcg into the lungs every morning. 05/07/19   [name], MD  traZODone (DESYREL) 50 MG tablet Take 0.5-1 tablets (25-50 mg total) by mouth daily. One hour before bedtime 05/07/19   [name], MD  VENTOLIN HFA 108 (90 Base) MCG/ACT inhaler INHALE 2 PUFFS BY MOUTH INTO THE LUNGS EVERY 6 HOURS AS NEEDED FOR WHEEZING 09/09/18   [name], MD    Allergies Hydrocodone-acetaminophen  Family History  Problem Relation Age of Onset  . Arthritis Mother   . Cancer Mother        uterus cancer  . Hyperlipidemia Mother   . Hypertension Mother   . Heart disease Mother   . Diabetes Mother   . Hyperlipidemia Father   . Hypertension Father   . Heart disease Father   . Diabetes Father   . Cancer Sister        ovary cancer  . Diabetes Maternal Grandmother   . Hypertension Maternal Grandmother   . Arthritis Maternal Grandmother   . Hypertension Maternal  Grandfather   . Hypertension Paternal Grandmother   . Hypertension Paternal Grandfather   . Heart disease Paternal Grandfather   . Heart disease Brother   . Kidney disease Brother   . Breast cancer Neg Hx     Social History Social History   Tobacco Use  . Smoking status: Current Every Day Smoker    Packs/day: 0.50    Types: Cigarettes    Last attempt to quit: 03/16/2019    Years since quitting: 0.1  . Smokeless tobacco: Never Used  . Tobacco comment: 1/2-1 ppd  Substance Use Topics  . Alcohol use: Yes    Alcohol/week: 2.0 standard drinks    Types: 2 Shots of liquor per week  . Drug use: No    Review of Systems  Constitutional: No fever/chills Eyes: No visual changes. ENT: No sore throat. Cardiovascular:  chest pain. Respiratory:  shortness of breath. Gastrointestinal: No abdominal pain.  No nausea, no vomiting.  No diarrhea.  No constipation. Genitourinary: Negative for dysuria. Musculoskeletal: Negative for back pain. Skin: Negative for rash. Neurological: Negative for headaches, focal weakness  ____________________________________________   PHYSICAL EXAM:  VITAL SIGNS: ED Triage Vitals  Enc Vitals Group     BP      Pulse      Resp      Temp      Temp src      SpO2      Weight      Height      Head Circumference      Peak Flow      Pain Score      Pain Loc      Pain Edu?      Excl. in [place]?     Constitutional: Alert and oriented.  In respiratory distress Eyes: Conjunctivae are normal.  Head: Atraumatic. Nose: No congestion/rhinnorhea. Mouth/Throat: Mucous membranes are moist.  Oropharynx non-erythematous. Neck: No stridor.   Cardiovascular: Rapid rate, regular rhythm. Grossly normal heart sounds.  Good peripheral circulation. Respiratory: Increased respiratory effort.   retractions. Lungs occasional tight wheezes in bases only air movement sounds good higher up Gastrointestinal: Soft and nontender. No distention. No abdominal bruits. No CVA  tenderness. Musculoskeletal: No lower extremity tenderness nor edema.  Neurologic:  Normal speech and language. No gross focal neurologic deficits are appreciated Skin:  Skin is warm, dry and intact. No rash noted.   ____________________________________________   LABS (all labs ordered are listed, but only abnormal results are displayed)  Labs Reviewed  COMPREHENSIVE METABOLIC PANEL - Abnormal; Notable for the following components:      Result Value   Potassium 3.4 (*)    CO2 15 (*)    Glucose, Bld 437 (*)    Creatinine, Ser 1.32 (*)    Calcium 8.5 (*)    AST 69 (*)    Total Bilirubin 2.2 (*)    GFR calc non Af Amer 42 (*)    GFR calc Af Amer 49 (*)    Anion gap 23 (*)    All other components within normal limits  BRAIN NATRIURETIC PEPTIDE - Abnormal; Notable for the following components:   B Natriuretic Peptide 4,221.0 (*)    All other components within normal limits  CBC WITH DIFFERENTIAL/PLATELET - Abnormal; Notable for the following components:   WBC 16.4 (*)    RBC 3.33 (*)    Hemoglobin 11.9 (*)    HCT 35.3 (*)    MCV 106.0 (*)    MCH 35.7 (*)    Neutro Abs 14.0 (*)    Abs Immature Granulocytes 0.13 (*)    All other components within normal limits  TROPONIN I (HIGH SENSITIVITY) - Abnormal; Notable for the following components:   Troponin I (High Sensitivity) 55 (*)    All other components within normal limits  SARS CORONAVIRUS 2 (HOSPITAL ORDER, [place] [facility])   ____________________________________________  EKG  EKG read and interpreted by me shows sinus tach at a rate of 138 rightward axis patient has a pacer in with this EKG is not paced.  There are ST segment depression T wave inversion inferiorly in 2 3 and F as well as laterally in V5 and 6 this is somewhat worse than previous EKGs which were paced at a lower rate.  Might be rate related. ____________________________________________  RADIOLOGY  ED MD interpretation: Chest x-ray  read by me looks like enlarged heart with CHF  Official radiology report(s): Dg Chest Portable 1 View  Result Date: 05/27/2019 CLINICAL DATA:  Shortness of breath EXAM: PORTABLE CHEST 1 VIEW COMPARISON:  03/16/2019 FINDINGS: cardiomegaly and CABG. Biventricular pacer leads from the right in stable position. Diffuse interstitial opacity, vascular pedicle widening, and small pleural effusions. IMPRESSION: CHF. Electronically Signed   By: Monte Fantasia M.D.   On: 05/27/2019 04:51    ____________________________________________   PROCEDURES  Procedure(s) performed (including Critical Care): Medical care time 40 minutes this includes caring for the patient and watching her carefully because she improved somewhat.  Also to had to review her past records and discussion with the hospitalist.  Procedures   ____________________________________________   INITIAL IMPRESSION / ASSESSMENT AND PLAN / ED COURSE EMS gave 2 duo nebs and 1 dose of Solu-Medrol without a whole lot of response    Chest x-ray shows CHF.  We will give her Lasix and Nitropaste her blood pressure is somewhat elevated.  See how she does.  We will have to get her in the hospital.  She is still working hard to breathe.KEYA WYNES was evaluated in Emergency Department on 05/27/2019 for the symptoms described in the history of present illness. She was evaluated in the context of the global COVID-19 pandemic, which necessitated consideration that the patient might be at risk for infection with the SARS-CoV-2 virus that causes COVID-19. Institutional protocols and algorithms that  pertain to the evaluation of patients at risk for COVID-19 are in a state of rapid change based on information released by regulatory bodies including the CDC and federal and state organizations. These policies and algorithms were followed during the patient's care in the ED.         ____________________________________________   FINAL CLINICAL  IMPRESSION(S) / ED DIAGNOSES  Final diagnoses:  Acute on chronic congestive heart failure, unspecified heart failure type ([place])  Hypoxia  Elevated troponin     ED Discharge Orders    None       Note:  This document was prepared using Dragon voice recognition software and may include unintentional dictation errors.    Nena Polio, MD 05/27/19 [id number]    Nena Polio, MD 06/02/19 [phone]

## 2019-05-27 NOTE — Progress Notes (Signed)
[place] [facility] [place] NAME: Ashley [name]    MR#:  [id number]  DATE OF BIRTH:  01-18-1951  SUBJECTIVE:  CHIEF COMPLAINT:   Chief Complaint  Patient presents with  . Respiratory Distress   Patient came with respiratory failure and noted to have CHF exacerbation and started on BiPAP initially but improved and when I saw she was on nasal cannula oxygen.  REVIEW OF SYSTEMS:  CONSTITUTIONAL: No fever, fatigue or weakness.  EYES: No blurred or double vision.  EARS, NOSE, AND THROAT: No tinnitus or ear pain.  RESPIRATORY: No cough, shortness of breath, wheezing or hemoptysis.  CARDIOVASCULAR: No chest pain, orthopnea, edema.  GASTROINTESTINAL: No nausea, vomiting, diarrhea or abdominal pain.  GENITOURINARY: No dysuria, hematuria.  ENDOCRINE: No polyuria, nocturia,  HEMATOLOGY: No anemia, easy bruising or bleeding SKIN: No rash or lesion. MUSCULOSKELETAL: No joint pain or arthritis.   NEUROLOGIC: No tingling, numbness, weakness.  PSYCHIATRY: No anxiety or depression.   ROS  DRUG ALLERGIES:   Allergies  Allergen Reactions  . Hydrocodone-Acetaminophen Nausea Only    VITALS:  Blood pressure 110/69, pulse 99, temperature 98 F (36.7 C), temperature source Axillary, resp. rate (!) 25, height 5\' 3"  (1.6 m), weight 76.5 kg, SpO2 (!) 89 %.  PHYSICAL EXAMINATION:  GENERAL:  68 y.o.-year-old patient lying in the bed with no acute distress.  EYES: Pupils equal, round, reactive to light and accommodation. No scleral icterus. Extraocular muscles intact.  HEENT: Head atraumatic, normocephalic. Oropharynx and nasopharynx clear.  NECK:  Supple, no jugular venous distention. No thyroid enlargement, no tenderness.  LUNGS: Normal breath sounds bilaterally, no wheezing, some crepitation. No use of accessory muscles of respiration.  CARDIOVASCULAR: S1, S2 normal. No murmurs, rubs, or gallops.  ABDOMEN: Soft, nontender, nondistended. Bowel sounds present.  No organomegaly or mass.  EXTREMITIES: No pedal edema, cyanosis, or clubbing.  NEUROLOGIC: Cranial nerves II through XII are intact. Muscle strength 5/5 in all extremities. Sensation intact. Gait not checked.  PSYCHIATRIC: The patient is alert and oriented x 3.  SKIN: No obvious rash, lesion, or ulcer.   Physical Exam LABORATORY PANEL:   CBC Recent Labs  Lab 05/27/19 0420  WBC 16.4*  HGB 11.9*  HCT 35.3*  PLT 336   ------------------------------------------------------------------------------------------------------------------  Chemistries  Recent Labs  Lab 05/27/19 0420 05/27/19 0641 05/27/19 1012  NA 136  --  140  K 3.4*  --  2.6*  CL 98  --  100  CO2 15*  --  23  GLUCOSE 437*  --  244*  BUN 12  --  12  CREATININE 1.32*  --  1.26*  CALCIUM 8.5*  --  8.8*  MG  --  2.1  --   AST 69*  --   --   ALT 36  --   --   ALKPHOS 88  --   --   BILITOT 2.2*  --   --    ------------------------------------------------------------------------------------------------------------------  Cardiac Enzymes No results for input(s): TROPONINI in the last 168 hours. ------------------------------------------------------------------------------------------------------------------  RADIOLOGY:  Dg Chest Portable 1 View  Result Date: 05/27/2019 CLINICAL DATA:  Shortness of breath EXAM: PORTABLE CHEST 1 VIEW COMPARISON:  03/16/2019 FINDINGS: cardiomegaly and CABG. Biventricular pacer leads from the right in stable position. Diffuse interstitial opacity, vascular pedicle widening, and small pleural effusions. IMPRESSION: CHF. Electronically Signed   By: Monte Fantasia M.D.   On: 05/27/2019 04:51    ASSESSMENT AND PLAN:   Active Problems:   Acute  on chronic combined systolic and diastolic CHF (congestive heart failure) ([place])  1.  Acute on chronic diastolic CHF with subsequent acute respiratory failure.  Her most recent 2D echo was on 03/17/2019 and revealed low normal EF of 50 to 75% and  diastolic dysfunction.  It showed mild to moderate mitral regurgitation and mild left and right atrial dilatation.     diuresed with IV Lasix. Will follow serial cardiac enzymes.   cardiology consultation  Her leukocytosis is likely secondary to her acute respiratory distress from acute CHF.  2.  Hypokalemia. Potassium replaced and magnesium level checked.  3.  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   given a bolus of IV insulin followed by IV insulin drip.  DKA protocol followed in stepdown unit.  4.  Hypertensive urgency.  This is likely contributing to #1.  continue her antihypertensives and place her on PRN IV hydralazine and labetalol.  5.  Coronary artery disease status post CABG.  We will continue Coreg aspirin Plavix and statin therapy.  6.  DVT prophylaxis.  Subcutaneous Lovenox.     All the records are reviewed and case discussed with Care Management/Social Workerr. Management plans discussed with the patient, family and they are in agreement.  CODE STATUS: full.  TOTAL TIME TAKING CARE OF THIS PATIENT: 35 minutes.     POSSIBLE D/C IN 1-2 DAYS, DEPENDING ON CLINICAL CONDITION.   [name] M.D on 05/27/2019   Between 7am to 6pm - Pager - [phone]  After 6pm go to [email or web address] - password [email or web address] [facility]  Office  [phone]  CC: Primary care physician; [name], MD  Note: This dictation was prepared with Dragon dictation along with smaller phrase technology. Any transcriptional errors that result from this process are unintentional.

## 2019-05-27 NOTE — ED Notes (Signed)
Patient resting comfortably on bi-pap at this time

## 2019-05-27 NOTE — H&P (Signed)
[facility] at [place] NAME: Ashley [name]    MR#:  [id number]  DATE OF BIRTH:  11/07/1950  DATE OF ADMISSION:  05/27/2019  PRIMARY CARE PHYSICIAN: [name], MD   REQUESTING/REFERRING PHYSICIAN: Conni Slipper, MD CHIEF COMPLAINT:   Chief Complaint  Patient presents with   Respiratory Distress    HISTORY OF PRESENT ILLNESS:  Ashley [name]  is a 68 y.o. Caucasian female with a known history of multiple medical problems that will be mentioned below, including diastolic CHF with EF of 50 to 59% with diastolic dysfunction, mild left and right atrial dilatation and mild to moderate mitral regurgitation presented to the emergency room with acute onset of worsening dyspnea with associated cough productive of clear and occasionally brown sputum and wheezing which have been going on over the last 5 days.  She denied any fever or chills but she has been having mild headache without dizziness or blurred vision.  No nausea vomiting or abdominal pain.  She denied any chest pain or palpitations.  No loss of taste or smell.  No rhinorrhea or nasal congestion or sore throat or earache.  No recent exposure to COVID-19.  Upon presentation to the emergency room, blood pressure was 165/119 with a pulse of 131, respiratory to 37 and O2 sat of 95-97% on CPAP placed by EMS that was later switched to BiPAP in the ER.  Portable chest x-ray showed cardiomegaly, median sternotomy and pacemaker and diffuse interstitial opacities concerning for pulmonary edema with small pleural effusions consistent with acu remarkable for BNP of 4221 and troponin I of 55 with a potassium of 3.4 BUN of 12 and creatinine 1.32 with leukocytosis of 16.4, hemoglobin of 11.9 hematocrit of 35.3 and platelets of 33.  6 with neutrophilia blood glucose was significant elevated at 437.  COVID-19 test is currently pending.  EKG showed sinus tachycardia with rate 138 with multifocal PVCs,  nonspecific intraventricular conduction delay and T wave inversion anterolaterally and inferiorly.  The patient was given 40 mg of IV Lasix and 2 g of IV magnesium sulfate as well as duo nebs and 1 inch of Nitropaste.  She will be admitted to stepdown unit for further evaluation and management.  PAST MEDICAL HISTORY:   Past Medical History:  Diagnosis Date   AICD (automatic cardioverter/defibrillator) present    on right side   Anemia    CAD (coronary artery disease)    s/p CABG   CHF (congestive heart failure) (HCC)    Chronic kidney disease    renal artery stenosis   Colon cancer (HCC)    COPD (chronic obstructive pulmonary disease) (HCC)    Depression    Dyspnea    GERD (gastroesophageal reflux disease)    Hx of colonic polyps    Hyperlipidemia    Hypertension    Mitral valve disorder    s/p mitral valve repair wth CABG   Myocardial infarction ([place])    Peripheral vascular disease ([place])    Presence of permanent cardiac pacemaker    Pacemaker/ Defibrillator    PAST SURGICAL HISTORY:   Past Surgical History:  Procedure Laterality Date   arm surgery     fracture, has plates and screws   CABG with mitral valve repair     CARDIAC CATHETERIZATION     COLON SURGERY     colon cancer   COLONOSCOPY WITH PROPOFOL N/A 10/09/2016   Procedure: COLONOSCOPY WITH PROPOFOL;  Surgeon: Jonathon Bellows, MD;  Location:  [facility] ENDOSCOPY;  Service: Endoscopy;  Laterality: N/A;   COLONOSCOPY WITH PROPOFOL N/A 07/24/2018   Procedure: COLONOSCOPY WITH PROPOFOL;  Surgeon: Virgel Manifold, MD;  Location: [facility] ENDOSCOPY;  Service: Endoscopy;  Laterality: N/A;   CORONARY ARTERY BYPASS GRAFT     ENTEROSCOPY N/A 11/21/2018   Procedure: ENTEROSCOPY-BALLOON;  Surgeon: Jonathon Bellows, MD;  Location: [facility];  Service: Gastroenterology;  Laterality: N/A;   ESOPHAGOGASTRODUODENOSCOPY (EGD) WITH PROPOFOL N/A 07/24/2018   Procedure: ESOPHAGOGASTRODUODENOSCOPY (EGD) WITH PROPOFOL;   Surgeon: Virgel Manifold, MD;  Location: [facility] ENDOSCOPY;  Service: Endoscopy;  Laterality: N/A;   GIVENS CAPSULE STUDY N/A 08/16/2018   Procedure: GIVENS CAPSULE STUDY;  Surgeon: Virgel Manifold, MD;  Location: [facility] ENDOSCOPY;  Service: Endoscopy;  Laterality: N/A;   pace maker defib  2016   PERIPHERAL VASCULAR CATHETERIZATION N/A 10/23/2016   Procedure: Renal Angiography;  Surgeon: Algernon Huxley, MD;  Location: [facility] LAB;  Service: Cardiovascular;  Laterality: N/A;   TOTAL ABDOMINAL HYSTERECTOMY  1999   history of abnormal pap    SOCIAL HISTORY:   Social History   Tobacco Use   Smoking status: Current Every Day Smoker    Packs/day: 0.50    Types: Cigarettes    Last attempt to quit: 03/16/2019    Years since quitting: 0.1   Smokeless tobacco: Never Used   Tobacco comment: 1/2-1 ppd  Substance Use Topics   Alcohol use: Yes    Alcohol/week: 2.0 standard drinks    Types: 2 Shots of liquor per week    FAMILY HISTORY:   Family History  Problem Relation Age of Onset   Arthritis Mother    Cancer Mother        uterus cancer   Hyperlipidemia Mother    Hypertension Mother    Heart disease Mother    Diabetes Mother    Hyperlipidemia Father    Hypertension Father    Heart disease Father    Diabetes Father    Cancer Sister        ovary cancer   Diabetes Maternal Grandmother    Hypertension Maternal Grandmother    Arthritis Maternal Grandmother    Hypertension Maternal Grandfather    Hypertension Paternal Grandmother    Hypertension Paternal Grandfather    Heart disease Paternal Grandfather    Heart disease Brother    Kidney disease Brother    Breast cancer Neg Hx     DRUG ALLERGIES:   Allergies  Allergen Reactions   Hydrocodone-Acetaminophen Nausea Only    REVIEW OF SYSTEMS:   ROS As per history of present illness. All pertinent systems were reviewed above. Constitutional,  HEENT, cardiovascular, respiratory, GI,  GU, musculoskeletal, neuro, psychiatric, endocrine,  integumentary and hematologic systems were reviewed and are otherwise  negative/unremarkable except for positive findings mentioned above in the HPI.   MEDICATIONS AT HOME:   Prior to Admission medications   Medication Sig Start Date End Date Taking? Authorizing Provider  acetaminophen (TYLENOL) 500 MG tablet Maximum 6 tablets a day. Patient taking differently: Take 500 mg by mouth every 4 (four) hours as needed for fever. Maximum 6 tablets a day. 02/09/15  Yes Doss, Velora Heckler, RN  ADVAIR DISKUS 250-50 MCG/DOSE AEPB INHALE 1 PUFF BY MOUTH INTO THE LUNGS EVERY 12 HOURS RINSE MOUTH AFTER EACH USE 11/26/18  Yes [name], MD  ALPRAZolam Duanne Moron) 0.5 MG tablet Take 0.5-1 tablets (0.25-0.5 mg total) by mouth at bedtime. TAKE ONE TABLET BY MOUTH AT BEDTIME AS NEEDED FOR  ANXIETY 02/24/19  Yes [name], MD  aspirin 81 MG tablet Take 81 mg by mouth daily.   Yes [provider]  atorvastatin (LIPITOR) 40 MG tablet TAKE ONE TABLET BY MOUTH ONCE DAILY 05/07/19  Yes [name], MD  carvedilol (COREG) 6.25 MG tablet TAKE ONE TABLET BY MOUTH TWICE DAILY WITH MEAL 05/07/19  Yes [name], MD  clopidogrel (PLAVIX) 75 MG tablet TAKE ONE TABLET BY MOUTH ONCE DAILY 05/16/19  Yes [name], MD  cyanocobalamin (,VITAMIN B-12,) 1000 MCG/ML injection Inject 1 mL weekly into the muscle monthly 11/08/18  Yes [name], MD  diphenhydrAMINE (DIPHENHIST) 25 mg capsule Take 25 mg by mouth at bedtime as needed.    Yes [provider]  escitalopram (LEXAPRO) 10 MG tablet TAKE ONE TABLET BY MOUTH ONCE DAILY 05/07/19  Yes [name], MD  ferrous sulfate 325 (65 FE) MG EC tablet Take 325 mg by mouth daily with breakfast.   Yes [provider]  furosemide (LASIX) 40 MG tablet Take 1 tablet (40 mg total) by mouth daily. 2 for weight gain > 2 lbs overnight 11/08/18  Yes [name], MD  ipratropium-albuterol (DUONEB) 0.5-2.5  (3) MG/3ML SOLN Take 3 mLs by nebulization every 6 (six) hours as needed. 08/05/18  Yes [name], MD  omeprazole (PRILOSEC) 40 MG capsule TAKE ONE CAPSULE BY MOUTH ONCE DAILY 05/07/19  Yes [name], MD  potassium chloride (K-DUR) 10 MEQ tablet TAKE ONE TABLET BY MOUTH ONCE DAILY 05/07/19  Yes [name], MD  sacubitril-valsartan (ENTRESTO) 24-26 MG Take 1 tablet by mouth 2 (two) times daily. 05/07/19  Yes Hackney, Otila Kluver A, FNP  Tiotropium Bromide Monohydrate (SPIRIVA RESPIMAT) 2.5 MCG/ACT AERS Inhale 5 mcg into the lungs every morning. 05/07/19  Yes [name], MD  traZODone (DESYREL) 50 MG tablet Take 0.5-1 tablets (25-50 mg total) by mouth daily. One hour before bedtime 05/07/19  Yes [name], MD  VENTOLIN HFA 108 (90 Base) MCG/ACT inhaler INHALE 2 PUFFS BY MOUTH INTO THE LUNGS EVERY 6 HOURS AS NEEDED FOR WHEEZING 09/09/18  Yes [name], MD  nitroGLYCERIN (NITROSTAT) 0.4 MG SL tablet Place 1 tablet (0.4 mg total) under the tongue every 5 (five) minutes as needed for chest pain. 06/19/16   [name], MD      VITAL SIGNS:  Blood pressure (!) 168/131, pulse (!) 130, temperature 97.7 F (36.5 C), temperature source Axillary, resp. rate (!) 27, height 5\' 3"  (1.6 m), weight 76.2 kg, SpO2 97 %.  PHYSICAL EXAMINATION:  Physical Exam  GENERAL:  68 y.o.-year-old Caucasian female patient lying in the bed in moderate respiratory distress on BiPAP eYES: Pupils equal, round, reactive to light and accommodation. No scleral icterus. Extraocular muscles intact.  HEENT: Head atraumatic, normocephalic. Oropharynx and nasopharynx clear.  NECK:  Supple, no jugular venous distention. No thyroid enlargement, no tenderness.  LUNGS: Diminished bibasilar breath sounds with bibasal rales. CARDIOVASCULAR: Regular rate and rhythm, S1, S2 normal. No murmurs, rubs, or gallops.  ABDOMEN: Soft, nondistended, nontender. Bowel sounds present. No organomegaly or mass.  EXTREMITIES: No pedal  edema, cyanosis, or clubbing.  NEUROLOGIC: Cranial nerves II through XII are intact. Muscle strength 5/5 in all extremities. Sensation intact. Gait not checked.  PSYCHIATRIC: The patient is alert and oriented x 3.  Normal affect and good eye contact. SKIN: No obvious rash, lesion, or ulcer.   LABORATORY PANEL:   CBC Recent Labs  Lab 05/27/19 0420  WBC 16.4*  HGB 11.9*  HCT 35.3*  PLT 336   ------------------------------------------------------------------------------------------------------------------  Chemistries  Recent Labs  Lab 05/27/19 0420  NA 136  K 3.4*  CL 98  CO2 15*  GLUCOSE 437*  BUN 12  CREATININE 1.32*  CALCIUM 8.5*  AST 69*  ALT 36  ALKPHOS 88  BILITOT 2.2*   ------------------------------------------------------------------------------------------------------------------  Cardiac Enzymes No results for input(s): TROPONINI in the last 168 hours. ------------------------------------------------------------------------------------------------------------------  RADIOLOGY:  Dg Chest Portable 1 View  Result Date: 05/27/2019 CLINICAL DATA:  Shortness of breath EXAM: PORTABLE CHEST 1 VIEW COMPARISON:  03/16/2019 FINDINGS: cardiomegaly and CABG. Biventricular pacer leads from the right in stable position. Diffuse interstitial opacity, vascular pedicle widening, and small pleural effusions. IMPRESSION: CHF. Electronically Signed   By: Monte Fantasia M.D.   On: 05/27/2019 04:51      IMPRESSION AND PLAN:   1.  Acute on chronic diastolic CHF with subsequent acute respiratory failure.  Her most recent 2D echo was on 03/17/2019 and revealed low normal EF of 50 to 63% and diastolic dysfunction.  It showed mild to moderate mitral regurgitation and mild left and right atrial dilatation.   The patient will be admitted to a telemetry bed and will be diuresed with IV Lasix.  Will follow serial cardiac enzymes.  Will obtain a cardiology consultation in a.m. I notified  Dr. Clayborn Bigness regarding the consult.  Her leukocytosis is likely secondary to her acute respiratory distress from acute CHF.  2.  Hypokalemia.  Potassium will be replaced and magnesium level will be checked.  3.  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  The patient will be given a bolus of IV insulin followed by IV insulin drip.  DKA protocol will be followed in stepdown unit.  4.  Hypertensive urgency.  This is likely contributing to #1.  We will continue her antihypertensives and place her on PRN IV hydralazine and labetalol.  5.  Coronary artery disease status post CABG.  We will continue Coreg aspirin Plavix and statin therapy.  6.  DVT prophylaxis.  Subcutaneous Lovenox.     All the records are reviewed and case discussed with ED provider. The plan of care was discussed in details with the patient (and family). I answered all questions. The patient agreed to proceed with the above mentioned plan. Further management will depend upon hospital course.   CODE STATUS: Full code  TOTAL CRITICAL CARE TIME SPENT WITH THIS PATIENT: 50 minutes.    Christel Mormon M.D on 05/27/2019 at 5:44 AM  Pager - [phone]  After 6pm go to [email or web address] - Proofreader  [employer] [facility]  Office  [phone]  CC: Primary care physician; [name], MD   Note: This dictation was prepared with Dragon dictation along with smaller phrase technology. Any transcriptional errors that result from this process are unintentional.

## 2019-05-27 NOTE — Progress Notes (Signed)
MD notified of potassium 2.6, MD to place orders.

## 2019-05-27 NOTE — ED Notes (Signed)
Patient continues to tripod despite having medications as prescribed and being on bipap. RT to come change settings on vent.

## 2019-05-27 NOTE — ED Triage Notes (Signed)
Patient to ER from home via [facility] for c/o respiratory distress. Had breathing treatments of her own that didn't seem to improve her status. Patient had 2 duonebs via EMS, 125mg  Solumedrol. Patient on NRB upon arrival, last O2 sat for EMS was 94%.

## 2019-05-27 NOTE — Progress Notes (Signed)
Patient arrived on unit at this time. Pt is alert and oriented on bipap. Pt able to follow commands. Pt is tachypnic with RR in the 72s. Pt says her breathing feels much better now.

## 2019-05-28 ENCOUNTER — Inpatient Hospital Stay: Payer: Medicare Other

## 2019-05-28 LAB — CBC WITH DIFFERENTIAL/PLATELET
Abs Immature Granulocytes: 0.07 10*3/uL (ref 0.00–0.07)
Basophils Absolute: 0 10*3/uL (ref 0.0–0.1)
Basophils Relative: 0 %
Eosinophils Absolute: 0 10*3/uL (ref 0.0–0.5)
Eosinophils Relative: 0 %
HCT: 29.6 % — ABNORMAL LOW (ref 36.0–46.0)
Hemoglobin: 10.2 g/dL — ABNORMAL LOW (ref 12.0–15.0)
Immature Granulocytes: 1 %
Lymphocytes Relative: 4 %
Lymphs Abs: 0.5 10*3/uL — ABNORMAL LOW (ref 0.7–4.0)
MCH: 35.5 pg — ABNORMAL HIGH (ref 26.0–34.0)
MCHC: 34.5 g/dL (ref 30.0–36.0)
MCV: 103.1 fL — ABNORMAL HIGH (ref 80.0–100.0)
Monocytes Absolute: 0.7 10*3/uL (ref 0.1–1.0)
Monocytes Relative: 5 %
Neutro Abs: 11.7 10*3/uL — ABNORMAL HIGH (ref 1.7–7.7)
Neutrophils Relative %: 90 %
Platelets: 221 10*3/uL (ref 150–400)
RBC: 2.87 MIL/uL — ABNORMAL LOW (ref 3.87–5.11)
RDW: 14.5 % (ref 11.5–15.5)
WBC: 12.9 10*3/uL — ABNORMAL HIGH (ref 4.0–10.5)
nRBC: 0 % (ref 0.0–0.2)

## 2019-05-28 LAB — GLUCOSE, CAPILLARY
Glucose-Capillary: 161 mg/dL — ABNORMAL HIGH (ref 70–99)
Glucose-Capillary: 174 mg/dL — ABNORMAL HIGH (ref 70–99)
Glucose-Capillary: 97 mg/dL (ref 70–99)
Glucose-Capillary: 156 mg/dL — ABNORMAL HIGH (ref 70–99)

## 2019-05-28 LAB — BASIC METABOLIC PANEL
Anion gap: 9 (ref 5–15)
BUN: 19 mg/dL (ref 8–23)
CO2: 28 mmol/L (ref 22–32)
Calcium: 8.6 mg/dL — ABNORMAL LOW (ref 8.9–10.3)
Chloride: 101 mmol/L (ref 98–111)
Creatinine, Ser: 1.14 mg/dL — ABNORMAL HIGH (ref 0.44–1.00)
GFR calc Af Amer: 58 mL/min — ABNORMAL LOW (ref >60)
GFR calc non Af Amer: 50 mL/min — ABNORMAL LOW (ref >60)
Glucose, Bld: 182 mg/dL — ABNORMAL HIGH (ref 70–99)
Potassium: 3.4 mmol/L — ABNORMAL LOW (ref 3.5–5.1)
Sodium: 138 mmol/L (ref 135–145)

## 2019-05-28 LAB — ECHOCARDIOGRAM COMPLETE
Height: 63 in
Weight: 2698.43 oz

## 2019-05-28 LAB — HEMOGLOBIN A1C
Hgb A1c MFr Bld: 5.5 % (ref 4.8–5.6)
Mean Plasma Glucose: 111.15 mg/dL

## 2019-05-28 MED ORDER — FUROSEMIDE 10 MG/ML IJ SOLN: 40.0000 mg | Freq: Once | INTRAMUSCULAR | Status: DC

## 2019-05-28 MED ORDER — FUROSEMIDE 10 MG/ML IJ SOLN
40.0000 mg | Freq: Once | INTRAMUSCULAR | Status: AC
  Administered 2019-05-28: 40 mg via INTRAVENOUS
  Filled 2019-05-28: qty 4

## 2019-05-28 MED ORDER — DOXYCYCLINE HYCLATE 100 MG PO TABS
100.0000 mg | ORAL_TABLET | Freq: Two times a day (BID) | ORAL | Status: DC
  Administered 2019-05-28 – 2019-05-29 (×3): 100 mg via ORAL
  Filled 2019-05-28 (×3): qty 1

## 2019-05-28 MED ORDER — FUROSEMIDE 10 MG/ML IJ SOLN
20.0000 mg | Freq: Two times a day (BID) | INTRAMUSCULAR | Status: DC
  Administered 2019-05-28 – 2019-05-31 (×6): 20 mg via INTRAVENOUS
  Filled 2019-05-28 (×6): qty 2

## 2019-05-28 NOTE — Progress Notes (Signed)
[facility] [place] NAME: Ashley [name]    MR#:  [id number]  DATE OF BIRTH:  Jul 31, 1951  SUBJECTIVE:  CHIEF COMPLAINT:   Chief Complaint  Patient presents with  . Respiratory Distress   Patient came with respiratory failure and noted to have CHF exacerbation and started on BiPAP initially but improved and when I saw she was on nasal cannula oxygen. Feels much better today.  REVIEW OF SYSTEMS:  CONSTITUTIONAL: No fever, fatigue or weakness.  EYES: No blurred or double vision.  EARS, NOSE, AND THROAT: No tinnitus or ear pain.  RESPIRATORY: No cough, shortness of breath, wheezing or hemoptysis.  CARDIOVASCULAR: No chest pain, orthopnea, edema.  GASTROINTESTINAL: No nausea, vomiting, diarrhea or abdominal pain.  GENITOURINARY: No dysuria, hematuria.  ENDOCRINE: No polyuria, nocturia,  HEMATOLOGY: No anemia, easy bruising or bleeding SKIN: No rash or lesion. MUSCULOSKELETAL: No joint pain or arthritis.   NEUROLOGIC: No tingling, numbness, weakness.  PSYCHIATRY: No anxiety or depression.   ROS  DRUG ALLERGIES:   Allergies  Allergen Reactions  . Hydrocodone-Acetaminophen Nausea Only    VITALS:  Blood pressure 118/71, pulse (!) 108, temperature 98.5 F (36.9 C), temperature source Oral, resp. rate 19, height 5\' 3"  (1.6 m), weight 76.7 kg, SpO2 98 %.  PHYSICAL EXAMINATION:  GENERAL:  68 y.o.-year-old patient lying in the bed with no acute distress.  EYES: Pupils equal, round, reactive to light and accommodation. No scleral icterus. Extraocular muscles intact.  HEENT: Head atraumatic, normocephalic. Oropharynx and nasopharynx clear.  NECK:  Supple, no jugular venous distention. No thyroid enlargement, no tenderness.  LUNGS: Normal breath sounds bilaterally, no wheezing, some crepitation. No use of accessory muscles of respiration.  CARDIOVASCULAR: S1, S2 normal. No murmurs, rubs, or gallops.  ABDOMEN: Soft, nontender, nondistended.  Bowel sounds present. No organomegaly or mass.  EXTREMITIES: No pedal edema, cyanosis, or clubbing.  NEUROLOGIC: Cranial nerves II through XII are intact. Muscle strength 5/5 in all extremities. Sensation intact. Gait not checked.  PSYCHIATRIC: The patient is alert and oriented x 3.  SKIN: No obvious rash, lesion, or ulcer.   Physical Exam LABORATORY PANEL:   CBC Recent Labs  Lab 05/28/19 0154  WBC 12.9*  HGB 10.2*  HCT 29.6*  PLT 221   ------------------------------------------------------------------------------------------------------------------  Chemistries  Recent Labs  Lab 05/27/19 0420 05/27/19 0641  05/28/19 0154  NA 136  --    < > 138  K 3.4*  --    < > 3.4*  CL 98  --    < > 101  CO2 15*  --    < > 28  GLUCOSE 437*  --    < > 182*  BUN 12  --    < > 19  CREATININE 1.32*  --    < > 1.14*  CALCIUM 8.5*  --    < > 8.6*  MG  --  2.1  --   --   AST 69*  --   --   --   ALT 36  --   --   --   ALKPHOS 88  --   --   --   BILITOT 2.2*  --   --   --    < > = values in this interval not displayed.   ------------------------------------------------------------------------------------------------------------------  Cardiac Enzymes No results for input(s): TROPONINI in the last 168 hours. ------------------------------------------------------------------------------------------------------------------  RADIOLOGY:  Dg Chest 2 View  Result Date: 05/28/2019 CLINICAL DATA:  Hypoxia. EXAM:  CHEST - 2 VIEW COMPARISON:  05/27/2019, 03/16/2019 and 11/08/2018 FINDINGS: Chronic cardiomegaly. CABG. AICD in place. Aortic atherosclerosis. There is slight diffuse accentuation of the interstitial markings with tiny bilateral pleural effusions. The pulmonary vascularity is now within normal limits. The pulmonary edema and effusions have improved since the prior study. Vascular congestion has resolved IMPRESSION: 1. Improving pulmonary edema and effusions. 2. Aortic atherosclerosis.  Electronically Signed   By: Lorriane Shire M.D.   On: 05/28/2019 13:53   Dg Chest Portable 1 View  Result Date: 05/27/2019 CLINICAL DATA:  Shortness of breath EXAM: PORTABLE CHEST 1 VIEW COMPARISON:  03/16/2019 FINDINGS: cardiomegaly and CABG. Biventricular pacer leads from the right in stable position. Diffuse interstitial opacity, vascular pedicle widening, and small pleural effusions. IMPRESSION: CHF. Electronically Signed   By: Monte Fantasia M.D.   On: 05/27/2019 04:51    ASSESSMENT AND PLAN:   Active Problems:   Acute on chronic combined systolic and diastolic CHF (congestive heart failure) ([facility])  1.  Acute on chronic diastolic CHF with subsequent acute respiratory failure.  Her most recent 2D echo was on 03/17/2019 and revealed low normal EF of 50 to 09% and diastolic dysfunction.  It showed mild to moderate mitral regurgitation and mild left and right atrial dilatation.     diuresed with IV Lasix.  follow serial cardiac enzymes.   cardiology consultation appreciated  Her leukocytosis is likely secondary to her acute respiratory distress from acute CHF.  Repeat echocardiogram shows decrease in cardiac ejection fraction up to 45% with severe dilatation of left ventricle and hypokinesis and severe mitral regurgitation. I have contacted cardiologist again for follow-up and management of these issues. She is already on Coreg and Entresto.  2.  Hypokalemia. Potassium replaced and magnesium level checked.  3.  DKA with uncontrolled type II Diabetes mellitus.   given a bolus of IV insulin followed by IV insulin drip.  DKA protocol followed in stepdown unit. Patient did not had diagnosis of diabetes on long-term.  Her hemoglobin A1c 2 months ago and now again is 5.5 I feel her hypoglycemia was stress-induced and now it is under control.  She may not need to be on insulin on discharge.  4.  Hypertensive urgency.  This is likely contributing to #1.  continue her antihypertensives and  place her on PRN IV hydralazine and labetalol.  5.  Coronary artery disease status post CABG.  We will continue Coreg aspirin Plavix and statin therapy.  6.  DVT prophylaxis.  Subcutaneous Lovenox.     All the records are reviewed and case discussed with Care Management/Social Workerr. Management plans discussed with the patient, family and they are in agreement.  CODE STATUS: full.  TOTAL TIME TAKING CARE OF THIS PATIENT: 35 minutes.     POSSIBLE D/C IN 1-2 DAYS, DEPENDING ON CLINICAL CONDITION.   [name] M.D on 05/28/2019   Between 7am to 6pm - Pager - [phone]  After 6pm go to [email or web address] - password [email or web address] [facility]  Office  [phone]  CC: Primary care physician; [name], MD  Note: This dictation was prepared with Dragon dictation along with smaller phrase technology. Any transcriptional errors that result from this process are unintentional.

## 2019-05-28 NOTE — Consult Note (Signed)
Name: Ashley [name] MRN: [id number] DOB: [birth year]    CONSULTATION DATE: 05/27/2019  REFERRING MD :  Dr. Sidney Ace  CHIEF COMPLAINT:  Shortness of breath.   SIGNIFICANT EVENTS: 05/27/2019: Presented to ED in respiratory distress. Patient admitted to ICU on BiPAP. Transitioned to 2L O2. ECHO completed. 05/28/2019: Remains off BiPAP, and on 2L O2.   OVERNIGHT EVENTS: Remained off BiPAP. Shortness of breath this AM improved with nebulizer treatment.   VITAL SIGNS: Temp:  [97.4 F (36.3 C)-98 F (36.7 C)] 97.9 F (36.6 C) (07/22 0800) Pulse Rate:  [83-100] 94 (07/22 0800) Resp:  [15-30] 19 (07/22 0800) BP: (90-131)/(52-98) 90/76 (07/22 0800) SpO2:  [89 %-100 %] 96 % (07/22 0800) Weight:  [76.5 kg-78.5 kg] 78.5 kg (07/22 0500)  I/O last 3 completed shifts: In: 237.7 [P.O.:160; I.V.:27.7; IV Piggyback:50] Out: 4782 [Urine:1450] No intake/output data recorded.  SpO2: 96 % O2 Flow Rate (L/min): 2 L/min FiO2 (%): 50 %  Physical Examination:  GENERAL: Patient sitting up, in no acute respiratory distress. Comfortable on 2L O2 via [place]. HEAD: Normocephalic, atraumatic.  EYES: Pupils equal, round, reactive to light.  No scleral icterus.  MOUTH: Moist mucosal membrane. No cyanosis. NECK: Supple. No LAD. No JVD. No hepatojugular reflex. No carotid bruit. PULMONARY: Bilateral bibasilar crackles with inspiration. Diffuse wheezing. CARDIOVASCULAR: S1 and S2. Regular rate and rhythm. No murmurs, rubs, or gallops.  GASTROINTESTINAL: Soft, nontender, non-distended. No masses. Positive bowel sounds. No hepatosplenomegaly. 10 cm echymosis along in suprapubic area at site of lovenox injection. EXTREMITIES:  Upper extremities: Radial pulses 2+ bilaterally. Normal capillary refill. Lower extremities: DP pulses 2+ bilaterally. No edema.  NEUROLOGIC: Alert and oriented to time, place and person.  SKIN: intact, warm, dry  I personally reviewed lab work that was obtained in last 24 hrs.  CBC  Latest Ref Rng & Units 05/28/2019 05/27/2019 03/16/2019  WBC 4.0 - 10.5 K/uL 12.9(H) 16.4(H) 8.3  Hemoglobin 12.0 - 15.0 g/dL 10.2(L) 11.9(L) 13.5  Hematocrit 36.0 - 46.0 % 29.6(L) 35.3(L) 39.3  Platelets 150 - 400 K/uL 221 336 319   BMP Latest Ref Rng & Units 05/28/2019 05/27/2019 05/27/2019  Glucose 70 - 99 mg/dL 182(H) 244(H) 437(H)  BUN 8 - 23 mg/dL 19 12 12   Creatinine 0.44 - 1.00 mg/dL 1.14(H) 1.26(H) 1.32(H)  BUN/Creat Ratio 6 - 22 (calc) - - -  Sodium 135 - 145 mmol/L 138 140 136  Potassium 3.5 - 5.1 mmol/L 3.4(L) 2.6(LL) 3.4(L)  Chloride 98 - 111 mmol/L 101 100 98  CO2 22 - 32 mmol/L 28 23 15(L)  Calcium 8.9 - 10.3 mg/dL 8.6(L) 8.8(L) 8.5(L)   MEDICATIONS: I have reviewed all medications and confirmed regimen as documented   IMAGING   ECHO 05/27/2019: completed and results pending.   Previous ECHO 03/17/2019: C/w LVH, EF 50%, evidence of diastolic dysfunction, mod MR.   No results found.         ASSESSMENT AND PLAN  68 year old Caucasian female with history of diastolic CHF, AMI and CABG -2007, right side AICD, PAD, COPD, current every day smoker, CKD and depression seen in the ICU for management of acute hypoxemic respiratory failure in the setting of decompensated diastolic CHF. Patient is now on 2L O2 and tolerating well.   ACUTE Hypoxic Respiratory Failure - Improving. Likely mostly due to CHF/pulmonary edema - Continue on Duoneb Therapy. - Curent O2 saturations for past 24 hours > 94%. Continue O2 via [place] as tolerated. BiPAP PRN.  - Procalcitonin elevated at  3.12 (baseline). Doxycycline initiated for probable underlying lower respiratory tract infection. Will continue to trend PCT.   ACUTE DIASTOLIC CARDIAC FAILURE - Stable - Patient currently on 40 mg IV Lasix. Continue diuresis as tolerated. - ECHO completed and pending.  - follow up elevated cardiac enzymes as indicated - cardiology following - follow up cardiology recommendations.  Hypokalemia - Improving -  Potassium level increased to 3.4, from 2.6 yesterday - Continue on PO Potassium chloride  Hyperglycemia - Controlled on Lantus and SSI - A1c today 5.5, unchanged from 2 months ago.  - Blood glucose < 200 for the past 24 hours.    Disposition: Patient will be transferred to the med surg floor with telemetry today. Patient remains hemodynamically stable on 2L O2 via [place]. PCCM will sign off  Merton Border, MD PCCM service Mobile [phone] Pager [phone] 05/28/2019 4:58 PM

## 2019-05-28 NOTE — Progress Notes (Signed)
Pt transferred to RM # [id number] at this time. VSS prior to transfer. Report called to 2A RN.

## 2019-05-28 NOTE — Progress Notes (Signed)
Pt complains of feeling short of breath. Oxygen sat is 95% on room air, lungs show exp. Wheeze to ascultation. RN will give breathing treatment early and reassess.

## 2019-05-29 ENCOUNTER — Inpatient Hospital Stay: Payer: Medicare Other

## 2019-05-29 LAB — GLUCOSE, CAPILLARY
Glucose-Capillary: 116 mg/dL — ABNORMAL HIGH (ref 70–99)
Glucose-Capillary: 107 mg/dL — ABNORMAL HIGH (ref 70–99)
Glucose-Capillary: 165 mg/dL — ABNORMAL HIGH (ref 70–99)
Glucose-Capillary: 135 mg/dL — ABNORMAL HIGH (ref 70–99)

## 2019-05-29 LAB — GASTROINTESTINAL PANEL BY PCR, STOOL (REPLACES STOOL CULTURE)

## 2019-05-29 LAB — BASIC METABOLIC PANEL
Anion gap: 9 (ref 5–15)
BUN: 24 mg/dL — ABNORMAL HIGH (ref 8–23)
CO2: 25 mmol/L (ref 22–32)
Calcium: 8.5 mg/dL — ABNORMAL LOW (ref 8.9–10.3)
Chloride: 101 mmol/L (ref 98–111)
Creatinine, Ser: 1.23 mg/dL — ABNORMAL HIGH (ref 0.44–1.00)
GFR calc Af Amer: 53 mL/min — ABNORMAL LOW (ref >60)
GFR calc non Af Amer: 46 mL/min — ABNORMAL LOW (ref >60)
Glucose, Bld: 121 mg/dL — ABNORMAL HIGH (ref 70–99)
Potassium: 4.1 mmol/L (ref 3.5–5.1)
Sodium: 135 mmol/L (ref 135–145)

## 2019-05-29 LAB — CBC WITH DIFFERENTIAL/PLATELET
Abs Immature Granulocytes: 0.04 10*3/uL (ref 0.00–0.07)
Basophils Absolute: 0 10*3/uL (ref 0.0–0.1)
Basophils Relative: 1 %
Eosinophils Absolute: 0 10*3/uL (ref 0.0–0.5)
Eosinophils Relative: 0 %
HCT: 30.8 % — ABNORMAL LOW (ref 36.0–46.0)
Hemoglobin: 10.4 g/dL — ABNORMAL LOW (ref 12.0–15.0)
Immature Granulocytes: 1 %
Lymphocytes Relative: 13 %
Lymphs Abs: 1.2 10*3/uL (ref 0.7–4.0)
MCH: 35.7 pg — ABNORMAL HIGH (ref 26.0–34.0)
MCHC: 33.8 g/dL (ref 30.0–36.0)
MCV: 105.8 fL — ABNORMAL HIGH (ref 80.0–100.0)
Monocytes Absolute: 0.9 10*3/uL (ref 0.1–1.0)
Monocytes Relative: 10 %
Neutro Abs: 6.7 10*3/uL (ref 1.7–7.7)
Neutrophils Relative %: 75 %
Platelets: 233 10*3/uL (ref 150–400)
RBC: 2.91 MIL/uL — ABNORMAL LOW (ref 3.87–5.11)
RDW: 15.4 % (ref 11.5–15.5)
WBC: 8.8 10*3/uL (ref 4.0–10.5)
nRBC: 0.3 % — ABNORMAL HIGH (ref 0.0–0.2)

## 2019-05-29 LAB — URINALYSIS, COMPLETE (UACMP) WITH MICROSCOPIC
Bilirubin Urine: NEGATIVE
Glucose, UA: NEGATIVE mg/dL
Ketones, ur: NEGATIVE mg/dL
Leukocytes,Ua: NEGATIVE
Nitrite: NEGATIVE
Protein, ur: NEGATIVE mg/dL
Specific Gravity, Urine: 1.006 (ref 1.005–1.030)
pH: 6 (ref 5.0–8.0)

## 2019-05-29 LAB — PROCALCITONIN: Procalcitonin: 2.29 ng/mL

## 2019-05-29 LAB — C DIFFICILE QUICK SCREEN W PCR REFLEX
C Diff antigen: NEGATIVE
C Diff interpretation: NOT DETECTED
C Diff toxin: NEGATIVE

## 2019-05-29 MED ORDER — LEVOFLOXACIN 500 MG PO TABS
500.0000 mg | ORAL_TABLET | Freq: Every day | ORAL | Status: DC
  Administered 2019-05-30 – 2019-05-31 (×2): 500 mg via ORAL
  Filled 2019-05-29 (×2): qty 1

## 2019-05-29 NOTE — Progress Notes (Signed)
Md notified. CCMD reports 13 beats of VT. No new orders at this time. I will continue to assess.

## 2019-05-29 NOTE — Progress Notes (Signed)
[name] NAME: Ashley [name]    MR#:  [id number]  DATE OF BIRTH:  May 22, 1951  SUBJECTIVE:   Patient states Ashley is feeling better this morning.  Ashley did have a fever to 101.39F around 7 AM.  Ashley also endorses >10 episodes of diarrhea over the last 24 hours.  No hematochezia or melena.  + Abdominal soreness  REVIEW OF SYSTEMS:  CONSTITUTIONAL: No fever, fatigue or weakness.  EYES: No blurred or double vision.  EARS, NOSE, AND THROAT: No tinnitus or ear pain.  RESPIRATORY: No cough, shortness of breath, wheezing or hemoptysis.  CARDIOVASCULAR: No chest pain, orthopnea, edema.  GASTROINTESTINAL: No nausea, vomiting, + diarrhea, + abdominal soreness GENITOURINARY: No dysuria, hematuria.  ENDOCRINE: No polyuria, nocturia,  HEMATOLOGY: No anemia, easy bruising or bleeding SKIN: No rash or lesion. MUSCULOSKELETAL: No joint pain or arthritis.   NEUROLOGIC: No tingling, numbness, weakness.  PSYCHIATRY: No anxiety or depression.   DRUG ALLERGIES:   Allergies  Allergen Reactions  . Hydrocodone-Acetaminophen Nausea Only    VITALS:  Blood pressure (!) 140/92, pulse (!) 101, temperature (!) 101.2 F (38.4 C), temperature source Oral, resp. rate (!) 24, height 5\' 3"  (1.6 m), weight 76.6 kg, SpO2 95 %.  PHYSICAL EXAMINATION:  GENERAL:  68 y.o.-year-old patient lying in the bed with no acute distress.  EYES: Pupils equal, round, reactive to light and accommodation. No scleral icterus. Extraocular muscles intact.  HEENT: Head atraumatic, normocephalic. Oropharynx and nasopharynx clear.  NECK:  Supple, no jugular venous distention. No thyroid enlargement, no tenderness.  LUNGS: Normal breath sounds bilaterally, no wheezing, some crepitation. No use of accessory muscles of respiration.  CARDIOVASCULAR: RRR, S1, S2 normal. No murmurs, rubs, or gallops.  ABDOMEN: Soft, nondistended. Bowel sounds present. No organomegaly or mass. + Mild LLQ  tenderness to palpation, no rebound or guarding. EXTREMITIES: No pedal edema, cyanosis, or clubbing.  NEUROLOGIC: Cranial nerves II through XII are intact. Muscle strength 5/5 in all extremities. Sensation intact. Gait not checked.  PSYCHIATRIC: The patient is alert and oriented x 3.  SKIN: No obvious rash, lesion, or ulcer.   LABORATORY PANEL:   CBC Recent Labs  Lab 05/29/19 0452  WBC 8.8  HGB 10.4*  HCT 30.8*  PLT 233   ------------------------------------------------------------------------------------------------------------------  Chemistries  Recent Labs  Lab 05/27/19 0420 05/27/19 0641  05/29/19 0452  NA 136  --    < > 135  K 3.4*  --    < > 4.1  CL 98  --    < > 101  CO2 15*  --    < > 25  GLUCOSE 437*  --    < > 121*  BUN 12  --    < > 24*  CREATININE 1.32*  --    < > 1.23*  CALCIUM 8.5*  --    < > 8.5*  MG  --  2.1  --   --   AST 69*  --   --   --   ALT 36  --   --   --   ALKPHOS 88  --   --   --   BILITOT 2.2*  --   --   --    < > = values in this interval not displayed.   ------------------------------------------------------------------------------------------------------------------  Cardiac Enzymes No results for input(s): TROPONINI in the last 168 hours. ------------------------------------------------------------------------------------------------------------------  RADIOLOGY:  Dg Chest 1 View  Result Date: 05/29/2019 CLINICAL DATA:  Fever. EXAM: CHEST  1 VIEW COMPARISON:  05/29/2019 FINDINGS: The patient has RIGHT-sided transvenous pacemaker leads to the RIGHT atrium, RIGHT ventricle, and coronary sinus. Status post median sternotomy. Heart is enlarged. There are prominent interstitial markings consistent with interstitial pulmonary edema. No overt alveolar edema. No consolidations or pleural effusions. Remote ORIF of the LEFT clavicle. IMPRESSION: Cardiomegaly and interstitial pulmonary edema. Electronically Signed   By: Nolon Nations M.D.   On:  05/29/2019 10:07   Dg Chest 2 View  Result Date: 05/28/2019 CLINICAL DATA:  Hypoxia. EXAM: CHEST - 2 VIEW COMPARISON:  05/27/2019, 03/16/2019 and 11/08/2018 FINDINGS: Chronic cardiomegaly. CABG. AICD in place. Aortic atherosclerosis. There is slight diffuse accentuation of the interstitial markings with tiny bilateral pleural effusions. The pulmonary vascularity is now within normal limits. The pulmonary edema and effusions have improved since the prior study. Vascular congestion has resolved IMPRESSION: 1. Improving pulmonary edema and effusions. 2. Aortic atherosclerosis. Electronically Signed   By: Lorriane Shire M.D.   On: 05/28/2019 13:53   Dg Chest Port 1 View  Result Date: 05/29/2019 CLINICAL DATA:  Patient admitted 05/27/2019 with respiratory distress. EXAM: PORTABLE CHEST 1 VIEW COMPARISON:  PA and lateral chest 05/28/2019 and 11/08/2018. Single-view of the chest 05/27/2019. FINDINGS: The patient is status post CABG with a pacing device in place. Cardiomegaly and atherosclerosis again seen. Pulmonary edema appears slightly worse than on the most recent comparison. Trace pleural effusions noted. No pneumothorax. No acute bony abnormality. IMPRESSION: Mildly increased pulmonary edema since the most recent exam. Cardiomegaly. Trace pleural effusions. Atherosclerosis. Electronically Signed   By: Inge Rise M.D.   On: 05/29/2019 07:39    ASSESSMENT AND PLAN:   Active Problems:   Acute on chronic combined systolic and diastolic CHF (congestive heart failure) ([facility])  Acute hypoxic respiratory failure secondary to acute on chronic diastolic CHF.  Recent ECHO with EF 44-31% and diastolic dysfunction. -Repeat ECHO with EF 45% and severe dilatation and hypokinesis of the left ventricle. -Continue IV Lasix -Continue Coreg and Entresto -Strict I/O, daily weights -Wean O2 as able  Fever/diarrhea- patient had a temperature to 101.53F this morning.  Procalcitonin has been elevated, but is  trending down. -Repeat chest x-ray, UA, blood cultures -Continue doxycycline for presumed pneumonia -Check GI pathogen panel and C. difficile given patient's diarrhea -If Ashley continues to spike fevers, will need to consider CT abdomen pelvis  Uncontrolled type 2 diabetes- patient initially in DKA, but this has resolved.   A1c is 5.5%. -Continue Lantus and moderate SSI  Hypertension- BP has improved. -Continue home BP meds  CAD s/p CABG -Continue Coreg, aspirin, Plavix, statin  Microcytic anemia-hemoglobin at baseline. -Add folate and B12 to morning labs  DVT prophylaxis.  Subcutaneous Lovenox.  All the records are reviewed and case discussed with Care Management/Social Workerr. Management plans discussed with the patient, family and they are in agreement.  CODE STATUS: full.  TOTAL TIME TAKING CARE OF THIS PATIENT: 35 minutes.    POSSIBLE D/C IN 1-2 DAYS, DEPENDING ON CLINICAL CONDITION.   Berna Spare Malesha Suliman M.D on 05/29/2019   Between 7am to 6pm - Pager - [phone]  After 6pm go to [email or web address] - password [email or web address] [facility]  Office  [phone]  CC: Primary care physician; [name], MD  Note: This dictation was prepared with Dragon dictation along with smaller phrase technology. Any transcriptional errors that result from this process are unintentional.

## 2019-05-29 NOTE — Progress Notes (Signed)
MD notified. Pts stool is positive for salmonella. MD will change IV abx. I will continue to assess.

## 2019-05-30 LAB — BASIC METABOLIC PANEL
Anion gap: 11 (ref 5–15)
BUN: 20 mg/dL (ref 8–23)
CO2: 25 mmol/L (ref 22–32)
Calcium: 8.3 mg/dL — ABNORMAL LOW (ref 8.9–10.3)
Chloride: 99 mmol/L (ref 98–111)
Creatinine, Ser: 1.13 mg/dL — ABNORMAL HIGH (ref 0.44–1.00)
GFR calc Af Amer: 59 mL/min — ABNORMAL LOW (ref >60)
GFR calc non Af Amer: 51 mL/min — ABNORMAL LOW (ref >60)
Glucose, Bld: 82 mg/dL (ref 70–99)
Potassium: 3 mmol/L — ABNORMAL LOW (ref 3.5–5.1)
Sodium: 135 mmol/L (ref 135–145)

## 2019-05-30 LAB — CBC WITH DIFFERENTIAL/PLATELET
Abs Immature Granulocytes: 0.04 10*3/uL (ref 0.00–0.07)
Basophils Absolute: 0.1 10*3/uL (ref 0.0–0.1)
Basophils Relative: 1 %
Eosinophils Absolute: 0.1 10*3/uL (ref 0.0–0.5)
Eosinophils Relative: 1 %
HCT: 31.7 % — ABNORMAL LOW (ref 36.0–46.0)
Hemoglobin: 10.9 g/dL — ABNORMAL LOW (ref 12.0–15.0)
Immature Granulocytes: 1 %
Lymphocytes Relative: 12 %
Lymphs Abs: 1 10*3/uL (ref 0.7–4.0)
MCH: 35.9 pg — ABNORMAL HIGH (ref 26.0–34.0)
MCHC: 34.4 g/dL (ref 30.0–36.0)
MCV: 104.3 fL — ABNORMAL HIGH (ref 80.0–100.0)
Monocytes Absolute: 0.7 10*3/uL (ref 0.1–1.0)
Monocytes Relative: 8 %
Neutro Abs: 6.4 10*3/uL (ref 1.7–7.7)
Neutrophils Relative %: 77 %
Platelets: 214 10*3/uL (ref 150–400)
RBC: 3.04 MIL/uL — ABNORMAL LOW (ref 3.87–5.11)
RDW: 15 % (ref 11.5–15.5)
WBC: 8.3 10*3/uL (ref 4.0–10.5)
nRBC: 0 % (ref 0.0–0.2)

## 2019-05-30 LAB — GLUCOSE, CAPILLARY
Glucose-Capillary: 79 mg/dL (ref 70–99)
Glucose-Capillary: 94 mg/dL (ref 70–99)
Glucose-Capillary: 91 mg/dL (ref 70–99)
Glucose-Capillary: 146 mg/dL — ABNORMAL HIGH (ref 70–99)

## 2019-05-30 LAB — FOLATE: Folate: 8.1 ng/mL (ref >5.9)

## 2019-05-30 LAB — VITAMIN B12: Vitamin B-12: 578 pg/mL (ref 180–914)

## 2019-05-30 LAB — PROCALCITONIN: Procalcitonin: 1.13 ng/mL

## 2019-05-30 MED ORDER — POTASSIUM CHLORIDE CRYS ER 20 MEQ PO TBCR
40.0000 meq | EXTENDED_RELEASE_TABLET | ORAL | Status: AC
  Administered 2019-05-30 (×2): 40 meq via ORAL
  Filled 2019-05-30 (×2): qty 2

## 2019-05-30 NOTE — Progress Notes (Signed)
Patient care taken over by this RN.  Resting at this time. Call bell in reach.

## 2019-05-30 NOTE — Care Management Important Message (Signed)
Important Message  Patient Details  Name: Ashley [name] MRN: [id number] Date of Birth: [birth year]   Medicare Important Message Given:  Yes     Dannette Barbara 05/30/2019, 12:40 PM

## 2019-05-30 NOTE — Plan of Care (Signed)
Nutrition Education Note  RD consulted for nutrition education regarding CHF.  68 y/o female admitted with CHF and DKA  Spoke with pt via phone. Pt reports good appetite and oral intake today and pta. Pt not very interested in education today reports that she knows what she should be eating and that she doesn't have any questions.   RD provided "Low Sodium Nutrition Therapy" handout from the [employer]. Reviewed patient's dietary recall. Provided examples on ways to decrease sodium intake in diet. Discouraged intake of processed foods and use of salt shaker. Encouraged fresh fruits and vegetables as well as whole grain sources of carbohydrates to maximize fiber intake.   RD discussed why it is important for patient to adhere to diet recommendations, and emphasized the role of fluids, foods to avoid, and importance of weighing self daily. Teach back method used.  Expect poor compliance.  Body mass index is 29.03 kg/m. Pt meets criteria for overweight based on current BMI.  Current diet order is HH/CHO, patient is consuming approximately 100% of meals at this time. Labs and medications reviewed. No further nutrition interventions warranted at this time. RD contact information provided. If additional nutrition issues arise, please re-consult RD.   Koleen Distance MS, RD, LDN Pager #- [phone] Office#- [phone] After Hours Pager: [phone]

## 2019-05-30 NOTE — TOC Initial Note (Signed)
Transition of Care [facility]) - Initial/Assessment Note    Patient Details  Name: Ashley [name] MRN: [id number] Date of Birth: 02-May-1951  Transition of Care [facility]) CM/SW Contact:    Latanya Maudlin, RN Phone Number: 05/30/2019, 2:01 PM  Clinical Narrative:  TOC consulted to complete high risk readmission assessment. Patient lives at home alone but has support from a friend, [name]. Patient reports she is mostly independent with activities of daily living. She reports no PT needs. However, as she has had several admissions and may discharge with oxygen she may benefit from home health. CMS Medicare.gov Compare Post Acute Care list reviewed with patient and she has no preference of agency. Will look at referrals once disposition in place. Uses [facility] and obtains medications without issue. PCP is [name].                  Expected Discharge Plan: [place] Barriers to Discharge: Continued Medical Work up   Patient Goals and CMS Choice     Choice offered to / list presented to : Patient  Expected Discharge Plan and Services Expected Discharge Plan: [place]   Discharge Planning Services: CM Consult Post Acute Care Choice: Durable Medical Equipment, Home Health Living arrangements for the past 2 months: Single Family Home                           HH Arranged: RN, Disease Management          Prior Living Arrangements/Services Living arrangements for the past 2 months: Single Family Home Lives with:: Self                   Activities of Daily Living Home Assistive Devices/Equipment: None ADL Screening (condition at time of admission) Patient's cognitive ability adequate to safely complete daily activities?: Yes Is the patient deaf or have difficulty hearing?: No Does the patient have difficulty seeing, even when wearing glasses/contacts?: No Does the patient have difficulty concentrating, remembering, or making decisions?:  No Patient able to express need for assistance with ADLs?: Yes Does the patient have difficulty dressing or bathing?: No Independently performs ADLs?: Yes (appropriate for developmental age) Does the patient have difficulty walking or climbing stairs?: No Weakness of Legs: None Weakness of Arms/Hands: None  Permission Sought/Granted                  Emotional Assessment              Admission diagnosis:  Hypoxia [R09.02] Elevated troponin [R79.89] Acute on chronic congestive heart failure, unspecified heart failure type [facility]) [I50.9] Patient Active Problem List   Diagnosis Date Noted  . Acute on chronic combined systolic and diastolic CHF (congestive heart failure) ([place]) 05/27/2019  . Insomnia 05/07/2019  . Hypokalemia 03/23/2019  . Hyperglycemia, drug-induced 03/23/2019  . Covid-19 Virus not Detected 03/23/2019  . Respiratory failure ([place]) 03/16/2019  . AVM (arteriovenous malformation) of small bowel, acquired   . Anemia, iron deficiency 11/10/2018  . Chronic systolic heart failure ([place]) 11/06/2018  . Rectal polyp   . Benign neoplasm of cecum   . Barrett's esophagus without dysplasia   . Stomach irritation   . Chronic diastolic heart failure ([place]) 07/03/2018  . HTN (hypertension) 07/03/2018  . COPD with emphysema ([place]) 06/28/2018  . B12 deficiency anemia 06/28/2018  . Hypotension 05/11/2018  . Prediabetes 05/11/2018  . Personal history of colon cancer   .  Benign neoplasm of descending colon   . Polyp of sigmoid colon   . Benign neoplasm of transverse colon   . Diverticulosis of large intestine without diverticulitis   . 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 Renal artery stenosis ([place]) 10/03/2016  . 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 Hospital discharge follow-up 02/09/2015  . Cough in adult 10/21/2014  . Tobacco abuse 10/11/2014  . Tobacco abuse counseling 10/11/2014  . Chronic right hip pain 08/26/2014  .  Atherosclerosis of native artery of extremity with intermittent claudication ([place]) 06/18/2013  . Preoperative evaluation to rule out surgical contraindication 06/18/2013  . CAD (coronary artery disease) 06/01/2013  . GERD (gastroesophageal reflux disease) 06/01/2013  . Hypercholesterolemia 06/01/2013  . Tubular adenoma of colon 06/01/2013  . Major depressive disorder in remission ([place]) 06/01/2013   PCP:  [name], MD Pharmacy:   [place], [address], [address] [place] [address] Phone: [phone] Fax: [place], [address] [address] Phone: [phone] Fax: [phone]  [id number] [place], [address] [place] [address] Phone: [phone] Fax: [phone]  [facility] [place] [place] [facility] [address] [place] [address] Phone: [phone] Fax: [phone]     Social Determinants of Health (SDOH) Interventions    Readmission Risk Interventions Readmission Risk Prevention Plan 05/30/2019  Transportation Screening Complete  PCP or Specialist Appt within 3-5 Days Complete  HRI or Home Care Consult Complete  Palliative Care Screening Not Applicable  Medication Review (RN Care Manager) Complete  Some recent data might be hidden

## 2019-05-30 NOTE — Progress Notes (Addendum)
[place] [facility] [place] NAME: Jearldean Gutt    MR#:  [id number]  DATE OF BIRTH:  1950-12-13  SUBJECTIVE:   Patient continuing to have multiple episodes of diarrhea.  No hematochezia or melena.  She states her abdominal soreness is better today.  She has been afebrile over the last 24 hours.  She feels like her shortness of breath is getting a little bit better every day.  REVIEW OF SYSTEMS:  CONSTITUTIONAL: No fever, fatigue or weakness.  EYES: No blurred or double vision.  EARS, NOSE, AND THROAT: No tinnitus or ear pain.  RESPIRATORY: No cough, shortness of breath, wheezing or hemoptysis.  CARDIOVASCULAR: No chest pain, orthopnea, edema.  GASTROINTESTINAL: No nausea, vomiting, + diarrhea, + abdominal soreness GENITOURINARY: No dysuria, hematuria.  ENDOCRINE: No polyuria, nocturia,  HEMATOLOGY: No anemia, easy bruising or bleeding SKIN: No rash or lesion. MUSCULOSKELETAL: No joint pain or arthritis.   NEUROLOGIC: No tingling, numbness, weakness.  PSYCHIATRY: No anxiety or depression.   DRUG ALLERGIES:   Allergies  Allergen Reactions  . Hydrocodone-Acetaminophen Nausea Only    VITALS:  Blood pressure 123/78, pulse 95, temperature 98.8 F (37.1 C), temperature source Oral, resp. rate 20, height 5\' 3"  (1.6 m), weight 74.3 kg, SpO2 98 %.  PHYSICAL EXAMINATION:  GENERAL:  68 y.o.-year-old patient lying in the bed with no acute distress.  EYES: Pupils equal, round, reactive to light and accommodation. No scleral icterus. Extraocular muscles intact.  HEENT: Head atraumatic, normocephalic. Oropharynx and nasopharynx clear.  NECK:  Supple, no jugular venous distention. No thyroid enlargement, no tenderness.  LUNGS: +bibasilar crackles. +R[place] in place. No use of accessory muscles of respiration.  CARDIOVASCULAR: RRR, S1, S2 normal. No murmurs, rubs, or gallops.  ABDOMEN: Soft, nondistended. Bowel sounds present. No organomegaly or mass. + Mild  LLQ tenderness to palpation, no rebound or guarding. EXTREMITIES: No pedal edema, cyanosis, or clubbing.  NEUROLOGIC: Cranial nerves II through XII are intact. Muscle strength 5/5 in all extremities. Sensation intact. Gait not checked.  PSYCHIATRIC: The patient is alert and oriented x 3.  SKIN: No obvious rash, lesion, or ulcer.   LABORATORY PANEL:   CBC Recent Labs  Lab 05/30/19 0605  WBC 8.3  HGB 10.9*  HCT 31.7*  PLT 214   ------------------------------------------------------------------------------------------------------------------  Chemistries  Recent Labs  Lab 05/27/19 0420 05/27/19 0641  05/30/19 0605  NA 136  --    < > 135  K 3.4*  --    < > 3.0*  CL 98  --    < > 99  CO2 15*  --    < > 25  GLUCOSE 437*  --    < > 82  BUN 12  --    < > 20  CREATININE 1.32*  --    < > 1.13*  CALCIUM 8.5*  --    < > 8.3*  MG  --  2.1  --   --   AST 69*  --   --   --   ALT 36  --   --   --   ALKPHOS 88  --   --   --   BILITOT 2.2*  --   --   --    < > = values in this interval not displayed.   ------------------------------------------------------------------------------------------------------------------  Cardiac Enzymes No results for input(s): TROPONINI in the last 168 hours. ------------------------------------------------------------------------------------------------------------------  RADIOLOGY:  Dg Chest 1 View  Result Date: 05/29/2019 CLINICAL DATA:  Fever. EXAM: CHEST  1 VIEW COMPARISON:  05/29/2019 FINDINGS: The patient has RIGHT-sided transvenous pacemaker leads to the RIGHT atrium, RIGHT ventricle, and coronary sinus. Status post median sternotomy. Heart is enlarged. There are prominent interstitial markings consistent with interstitial pulmonary edema. No overt alveolar edema. No consolidations or pleural effusions. Remote ORIF of the LEFT clavicle. IMPRESSION: Cardiomegaly and interstitial pulmonary edema. Electronically Signed   By: Nolon Nations M.D.    On: 05/29/2019 10:07   Dg Chest Port 1 View  Result Date: 05/29/2019 CLINICAL DATA:  Patient admitted 05/27/2019 with respiratory distress. EXAM: PORTABLE CHEST 1 VIEW COMPARISON:  PA and lateral chest 05/28/2019 and 11/08/2018. Single-view of the chest 05/27/2019. FINDINGS: The patient is status post CABG with a pacing device in place. Cardiomegaly and atherosclerosis again seen. Pulmonary edema appears slightly worse than on the most recent comparison. Trace pleural effusions noted. No pneumothorax. No acute bony abnormality. IMPRESSION: Mildly increased pulmonary edema since the most recent exam. Cardiomegaly. Trace pleural effusions. Atherosclerosis. Electronically Signed   By: Inge Rise M.D.   On: 05/29/2019 07:39    ASSESSMENT AND PLAN:   Acute hypoxic respiratory failure secondary to acute on chronic diastolic CHF.  Recent ECHO with EF 39-03% and diastolic dysfunction.  -Patient had desaturations to 87% on room air this morning -Repeat ECHO with EF 45% and severe dilatation and hypokinesis of the left ventricle. -Continue IV Lasix -Continue Coreg and Entresto -Strict I/O, daily weights -Wean O2 as able  Salmonella gastroenteritis- GI pathogen panel positive for salmonella. Patient is having persistent diarrhea. PCT trending down. -Continue levaquin  Uncontrolled type 2 diabetes- patient initially in DKA, but this has resolved.  A1c is 5.5%. -Continue Lantus and moderate SSI  Hypertension- BP has improved. -Continue home BP meds  CAD s/p CABG -Continue Coreg, aspirin, Plavix, statin  Microcytic anemia-hemoglobin at baseline. -Folate and Vitamin B12 were normal  DVT prophylaxis- Lovenox.  All the records are reviewed and case discussed with Care Management/Social Workerr. Management plans discussed with the patient, family and they are in agreement.  CODE STATUS: full.  TOTAL TIME TAKING CARE OF THIS PATIENT: 33 minutes.    POSSIBLE D/C IN 1-2 DAYS, DEPENDING  ON CLINICAL CONDITION.   Berna Spare Can Lucci M.D on 05/30/2019   Between 7am to 6pm - Pager - [phone]  After 6pm go to [email or web address] - password [email or web address] [place] [facility]  Office  [phone]  CC: Primary care physician; [name], MD  Note: This dictation was prepared with Dragon dictation along with smaller phrase technology. Any transcriptional errors that result from this process are unintentional.

## 2019-05-30 NOTE — Progress Notes (Signed)
SATURATION QUALIFICATIONS: (This note is used to comply with regulatory documentation for home oxygen)  Patient Saturations on Room Air at Rest = 87%  Patient Saturations on Room Air while Ambulating = n/a%  Patient Saturations on n/a Liters of oxygen while Ambulating = n/a%  Please briefly explain why patient needs home oxygen: Patient desats on room air at rest, needs 3L to sat at 91%.

## 2019-05-31 LAB — CBC WITH DIFFERENTIAL/PLATELET
Abs Immature Granulocytes: 0.08 10*3/uL — ABNORMAL HIGH (ref 0.00–0.07)
Basophils Absolute: 0 10*3/uL (ref 0.0–0.1)
Basophils Relative: 1 %
Eosinophils Absolute: 0.2 10*3/uL (ref 0.0–0.5)
Eosinophils Relative: 2 %
HCT: 31.7 % — ABNORMAL LOW (ref 36.0–46.0)
Hemoglobin: 10.8 g/dL — ABNORMAL LOW (ref 12.0–15.0)
Immature Granulocytes: 1 %
Lymphocytes Relative: 12 %
Lymphs Abs: 1 10*3/uL (ref 0.7–4.0)
MCH: 35.1 pg — ABNORMAL HIGH (ref 26.0–34.0)
MCHC: 34.1 g/dL (ref 30.0–36.0)
MCV: 102.9 fL — ABNORMAL HIGH (ref 80.0–100.0)
Monocytes Absolute: 1.2 10*3/uL — ABNORMAL HIGH (ref 0.1–1.0)
Monocytes Relative: 15 %
Neutro Abs: 5.6 10*3/uL (ref 1.7–7.7)
Neutrophils Relative %: 69 %
Platelets: 235 10*3/uL (ref 150–400)
RBC: 3.08 MIL/uL — ABNORMAL LOW (ref 3.87–5.11)
RDW: 14.6 % (ref 11.5–15.5)
WBC: 8.2 10*3/uL (ref 4.0–10.5)
nRBC: 0 % (ref 0.0–0.2)

## 2019-05-31 LAB — BASIC METABOLIC PANEL
Anion gap: 7 (ref 5–15)
BUN: 18 mg/dL (ref 8–23)
CO2: 28 mmol/L (ref 22–32)
Calcium: 8.5 mg/dL — ABNORMAL LOW (ref 8.9–10.3)
Chloride: 102 mmol/L (ref 98–111)
Creatinine, Ser: 1.14 mg/dL — ABNORMAL HIGH (ref 0.44–1.00)
GFR calc Af Amer: 58 mL/min — ABNORMAL LOW (ref >60)
GFR calc non Af Amer: 50 mL/min — ABNORMAL LOW (ref >60)
Glucose, Bld: 128 mg/dL — ABNORMAL HIGH (ref 70–99)
Potassium: 3.2 mmol/L — ABNORMAL LOW (ref 3.5–5.1)
Sodium: 137 mmol/L (ref 135–145)

## 2019-05-31 LAB — GLUCOSE, CAPILLARY
Glucose-Capillary: 125 mg/dL — ABNORMAL HIGH (ref 70–99)
Glucose-Capillary: 143 mg/dL — ABNORMAL HIGH (ref 70–99)
Glucose-Capillary: 132 mg/dL — ABNORMAL HIGH (ref 70–99)

## 2019-05-31 MED ORDER — LEVOFLOXACIN 500 MG PO TABS: 500.0000 mg | ORAL_TABLET | Freq: Every day | ORAL | 0 refills | Status: DC

## 2019-05-31 NOTE — Progress Notes (Signed)
Patient discharged to home. Tele and IV d/c'd.  Patient verbalizes understanding of instructions. Belongings sent home with patient.

## 2019-05-31 NOTE — Plan of Care (Signed)
  Problem: Clinical Measurements: Goal: Ability to maintain clinical measurements within normal limits will improve Outcome: Progressing   Problem: Activity: Goal: Risk for activity intolerance will decrease Outcome: Progressing   Problem: Pain Managment: Goal: General experience of comfort will improve Outcome: Progressing   Problem: Safety: Goal: Ability to remain free from injury will improve Outcome: Progressing   Problem: Activity: Goal: Capacity to carry out activities will improve Outcome: Progressing   Problem: Cardiac: Goal: Ability to achieve and maintain adequate cardiopulmonary perfusion will improve Outcome: Progressing

## 2019-05-31 NOTE — Progress Notes (Signed)
SATURATION QUALIFICATIONS: (This note is used to comply with regulatory documentation for home oxygen)  Patient Saturations on Room Air at Rest = 96%  Patient Saturations on Room Air while Ambulating = 93%  

## 2019-06-02 ENCOUNTER — Other Ambulatory Visit: Payer: Self-pay

## 2019-06-02 ENCOUNTER — Telehealth (INDEPENDENT_AMBULATORY_CARE_PROVIDER_SITE_OTHER): Payer: Medicare Other | Admitting: Internal Medicine

## 2019-06-02 DIAGNOSIS — I5032 Chronic diastolic (congestive) heart failure: Secondary | ICD-10-CM

## 2019-06-02 DIAGNOSIS — I5033 Acute on chronic diastolic (congestive) heart failure: Secondary | ICD-10-CM

## 2019-06-02 DIAGNOSIS — E7800 Pure hypercholesterolemia, unspecified: Secondary | ICD-10-CM

## 2019-06-02 DIAGNOSIS — J439 Emphysema, unspecified: Secondary | ICD-10-CM | POA: Diagnosis not present

## 2019-06-02 NOTE — Telephone Encounter (Signed)
First attempt for Transitional Care Management call, left message for patient to call office , will continue to monitor and attempt TCM.

## 2019-06-03 ENCOUNTER — Ambulatory Visit: Payer: Self-pay | Admitting: Pharmacist

## 2019-06-03 ENCOUNTER — Telehealth: Payer: Self-pay | Admitting: Pharmacy Technician

## 2019-06-03 ENCOUNTER — Encounter: Payer: Self-pay | Admitting: *Deleted

## 2019-06-03 ENCOUNTER — Other Ambulatory Visit: Payer: Self-pay | Admitting: *Deleted

## 2019-06-03 DIAGNOSIS — I5032 Chronic diastolic (congestive) heart failure: Secondary | ICD-10-CM

## 2019-06-03 DIAGNOSIS — J439 Emphysema, unspecified: Secondary | ICD-10-CM

## 2019-06-03 LAB — CULTURE, BLOOD (ROUTINE X 2)
Culture: NO GROWTH
Culture: NO GROWTH

## 2019-06-03 MED ORDER — ENTRESTO 24-26 MG PO TABS: 1.0000 | ORAL_TABLET | Freq: Two times a day (BID) | ORAL | 3 refills | Status: DC

## 2019-06-03 MED ORDER — ATORVASTATIN CALCIUM 40 MG PO TABS: 40.0000 mg | ORAL_TABLET | Freq: Every day | ORAL | 1 refills | Status: DC

## 2019-06-03 MED ORDER — SPIRIVA RESPIMAT 2.5 MCG/ACT IN AERS: 5.0000 ug | INHALATION_SPRAY | Freq: Every morning | RESPIRATORY_TRACT | 11 refills | Status: DC

## 2019-06-03 MED ORDER — FUROSEMIDE 40 MG PO TABS: 40.0000 mg | ORAL_TABLET | Freq: Every day | ORAL | 1 refills | Status: DC

## 2019-06-03 MED ORDER — OMEPRAZOLE 40 MG PO CPDR: 40.0000 mg | DELAYED_RELEASE_CAPSULE | Freq: Every day | ORAL | 1 refills | Status: DC

## 2019-06-03 MED ORDER — FLUTICASONE-SALMETEROL 250-50 MCG/DOSE IN AEPB: INHALATION_SPRAY | RESPIRATORY_TRACT | 1 refills | Status: DC

## 2019-06-03 MED ORDER — CARVEDILOL 6.25 MG PO TABS: ORAL_TABLET | ORAL | 1 refills | Status: DC

## 2019-06-03 MED ORDER — ALBUTEROL SULFATE HFA 108 (90 BASE) MCG/ACT IN AERS: INHALATION_SPRAY | RESPIRATORY_TRACT | 2 refills | Status: DC

## 2019-06-03 MED ORDER — CLOPIDOGREL BISULFATE 75 MG PO TABS: 75.0000 mg | ORAL_TABLET | Freq: Every day | ORAL | 1 refills | Status: DC

## 2019-06-03 MED ORDER — ESCITALOPRAM OXALATE 10 MG PO TABS: 10.0000 mg | ORAL_TABLET | Freq: Every day | ORAL | 1 refills | Status: DC

## 2019-06-03 MED ORDER — POTASSIUM CHLORIDE ER 10 MEQ PO TBCR: 10.0000 meq | EXTENDED_RELEASE_TABLET | Freq: Every day | ORAL | 1 refills | Status: DC

## 2019-06-03 MED ORDER — TRAZODONE HCL 50 MG PO TABS: 25.0000 mg | ORAL_TABLET | Freq: Every day | ORAL | 3 refills | Status: DC

## 2019-06-03 NOTE — Telephone Encounter (Signed)
All requested meds  E prescribed for 90 days and sent to medication mgmt

## 2019-06-03 NOTE — Chronic Care Management (AMB) (Signed)
Chronic Care Management   Note  06/03/2019 Name: Ashley [name] MRN: [id number] DOB: 05-Oct-1951   Subjective:  Ashley [name] is a 68 y.o. year old female who is a primary care patient of [name], [name], MD. The CCM team was consulted for assistance with chronic disease management and care coordination needs.    Received referral today regarding medication access.    Ashley [name] was given information about Chronic Care Management services today including:  1. CCM service includes personalized support from designated clinical staff supervised by her physician, including individualized plan of care and coordination with other care providers 2. 24/7 contact phone numbers for assistance for urgent and routine care needs. 3. Service will only be billed when office clinical staff spend 20 minutes or more in a month to coordinate care. 4. Only one practitioner may furnish and bill the service in a calendar month. 5. The patient may stop CCM services at any time (effective at the end of the month) by phone call to the office staff. 6. The patient will be responsible for cost sharing (co-pay) of up to 20% of the service fee (after annual deductible is met).  Patient agreed to services and verbal consent obtained.   Review of patient status, including review of consultants reports, laboratory and other test data, was performed as part of comprehensive evaluation and provision of chronic care management services.   Objective:  Lab Results  Component Value Date   CREATININE 1.14 (H) 05/31/2019   CREATININE 1.13 (H) 05/30/2019   CREATININE 1.23 (H) 05/29/2019    Lab Results  Component Value Date   HGBA1C 5.5 05/28/2019       Component Value Date/Time   CHOL 148 11/08/2018 1412   CHOL 218 (H) 05/30/2013 0038   TRIG (H) 11/08/2018 1412    550.0 Triglyceride is over 400; calculations on Lipids are invalid.   TRIG 381 (H) 05/30/2013 0038   HDL 39.60 11/08/2018 1412   HDL 35 (L)  05/30/2013 0038   CHOLHDL 4 11/08/2018 1412   VLDL 52 (H) 10/09/2014 1520   VLDL 76 (H) 05/30/2013 0038   LDLCALC 139 (H) 05/06/2018 1107   LDLCALC 107 (H) 05/30/2013 0038   LDLDIRECT 62.0 11/08/2018 1412    Clinical ASCVD: Yes     BP Readings from Last 3 Encounters:  05/31/19 124/72  03/17/19 133/87  12/20/18 135/85    Allergies  Allergen Reactions  . Hydrocodone-Acetaminophen Nausea Only    Medications Reviewed Today    Reviewed by De Hollingshead, [facility] (Pharmacist) on 06/03/19 at 1221  Med List Status: <None>  Medication Order Taking? Sig Documenting Provider Last Dose Status Informant  acetaminophen (TYLENOL) 500 MG tablet [id number]  Maximum 6 tablets a day.  Patient taking differently: Take 500 mg by mouth every 4 (four) hours as needed for fever. Maximum 6 tablets a day.   Rubbie Battiest, RN  Active Other  ADVAIR DISKUS 250-50 MCG/DOSE AEPB [id number] No INHALE 1 PUFF BY MOUTH INTO THE LUNGS EVERY 12 HOURS RINSE MOUTH AFTER EACH USE  Patient not taking: Reported on 06/03/2019   [name], MD Not Taking Active Other  ALPRAZolam Duanne Moron) 0.5 MG tablet [id number] Yes Take 0.5-1 tablets (0.25-0.5 mg total) by mouth at bedtime. TAKE ONE TABLET BY MOUTH AT BEDTIME AS NEEDED FOR ANXIETY [name], MD Taking Active Other  aspirin 81 MG tablet [id number] Yes Take 81 mg by mouth daily. [provider] Taking Active Other  atorvastatin (LIPITOR)  40 MG tablet [id number] No TAKE ONE TABLET BY MOUTH ONCE DAILY  Patient not taking: Reported on 06/03/2019   [name], MD Not Taking Active Other  carvedilol (COREG) 6.25 MG tablet [id number] Yes TAKE ONE TABLET BY MOUTH TWICE DAILY WITH MEAL [name], MD Taking Active Other  clopidogrel (PLAVIX) 75 MG tablet [id number] No TAKE ONE TABLET BY MOUTH ONCE DAILY  Patient not taking: Reported on 06/03/2019   [name], MD Not Taking Active Other  cyanocobalamin (,VITAMIN B-12,) 1000 MCG/ML injection [id number] No  Inject 1 mL weekly into the muscle monthly  Patient not taking: Reported on 06/03/2019   [name], MD Not Taking Active Other  diphenhydrAMINE (DIPHENHIST) 25 mg capsule [id number] Yes Take 25 mg by mouth at bedtime as needed.  [provider] Taking Active Other  escitalopram (LEXAPRO) 10 MG tablet [id number] No TAKE ONE TABLET BY MOUTH ONCE DAILY  Patient not taking: Reported on 06/03/2019   [name], MD Not Taking Active Other  ferrous sulfate 325 (65 FE) MG EC tablet [id number] No Take 325 mg by mouth daily with breakfast. [provider] Not Taking Active Other  furosemide (LASIX) 40 MG tablet [id number] Yes Take 1 tablet (40 mg total) by mouth daily. 2 for weight gain > 2 lbs overnight [name], MD Taking Active Other  ipratropium-albuterol (DUONEB) 0.5-2.5 (3) MG/3ML SOLN [id number] Yes Take 3 mLs by nebulization every 6 (six) hours as needed. [name], MD Taking Active Other  levofloxacin (LEVAQUIN) 500 MG tablet [id number] No Take 1 tablet (500 mg total) by mouth daily.  Patient not taking: Reported on 06/03/2019   Epifanio Lesches, MD Not Taking Active   nitroGLYCERIN (NITROSTAT) 0.4 MG SL tablet [id number] No Place 1 tablet (0.4 mg total) under the tongue every 5 (five) minutes as needed for chest pain.  Patient not taking: Reported on 06/03/2019   [name], MD Not Taking Active Other  omeprazole (PRILOSEC) 40 MG capsule [id number] No TAKE ONE CAPSULE BY MOUTH ONCE DAILY  Patient not taking: Reported on 06/03/2019   [name], MD Not Taking Active Other  potassium chloride (K-DUR) 10 MEQ tablet [id number] Yes TAKE ONE TABLET BY MOUTH ONCE DAILY [name], MD Taking Active Other  sacubitril-valsartan (ENTRESTO) 24-26 MG [id number] No Take 1 tablet by mouth 2 (two) times daily.  Patient not taking: Reported on 06/03/2019   Alisa Graff, FNP Not Taking Active Other  Tiotropium Bromide Monohydrate (SPIRIVA RESPIMAT) 2.5 MCG/ACT  AERS [id number] No Inhale 5 mcg into the lungs every morning.  Patient not taking: Reported on 06/03/2019   [name], MD Not Taking Active Other  traZODone (DESYREL) 50 MG tablet [id number] No Take 0.5-1 tablets (25-50 mg total) by mouth daily. One hour before bedtime  Patient not taking: Reported on 06/03/2019   [name], MD Not Taking Active Other  VENTOLIN HFA 108 [phone] Base) MCG/ACT inhaler [id number] Yes INHALE 2 PUFFS BY MOUTH INTO THE LUNGS EVERY 6 HOURS AS NEEDED FOR WHEEZING [name], MD Taking Active Other           Assessment:   Goals Addressed            This Visit's Progress     Patient Stated   . "I can't afford my medications and I'm out" (pt-stated)       Current Barriers:  . Financial concerns - patient notes that she was unable to  afford her Medicare Part D premium, so lost coverage. She reports that she cannot afford her medications and has been out of most everything for ~1.5 weeks, except the days she was admitted.  . Notes that she has furosemide and carvedilol, but nothing else.   Pharmacist Clinical Goal(s):  [name] Over the next 30 days, patient will work with PharmD to address needs related to medication access  Interventions: . Comprehensive medication review performed. [facility]. They can supply medications to the patient until her Part D plan takes effect with Open Enrollment. They assisted patient in completing Medicare Extra Help/LIS application today. Collaborated with [place] Adie, PharmD. Collaborated with Dr. Derrel Nip - sent list of which medications need to be e-scribed to [facility]. PharmD at [facility] transferred medications from [facility], but needs a refill on ferrous sulfate and nitroglycerin to [facility] from Dr. Derrel Nip. [name] [facility] outreached Heart Failure clinic to alert to medication cost concerns and to see if Entresto samples are in stock. Will collaborate with them to pursue  patient assistance.  [name] [place] does not have Spiriva - have messaged them to see if they can supply Incruse instead. Will pursue patient assistance for inhaler therapy for a long term solution.  Patient Self Care Activities:  . Self administers medications as prescribed . Calls pharmacy for medication refills  Initial goal documentation        Plan: - Will outreach patient on Thursday to ensure she was able to pick up medications, and to begin the process for patient assistance.   [name] Darnelle Maffucci, PharmD Clinical Pharmacist [place] [phone]

## 2019-06-03 NOTE — Telephone Encounter (Signed)
Patient reached out to [facility] for medication assistance.  Lost her prescription plan with Medicare due to inability to pay monthly premium.  Assisted patient with the completion of [place] application for help with paying for her part D premium.  Part D plan will not go into effect for 6 to 8 weeks.  Providing medication assistance for Atorvastatin, Clopidogrel, Escitalopram, Ferrous Sulfate and Nitroglycerin.  [place] does not have the Spiriva & Entresto.  Would have to obtain from pharmaceutical companies. Patient would have Part D plan before [facility] would be able to obtain these medications for patient.  Reaching out to [facility] to inquire about providing samples of Entresto for patient.  Reaching out to Dr. Derrel Nip to inquire about providing samples of Spiriva for patient.  Also, asked [name] Darnelle Maffucci, Pharmacist at Dr. Lupita [facility] about completing PAP applications for Entresto and Spiriva since [facility] would not be providing ongoing medication assistance past the initial fill.  [place] [facility]

## 2019-06-03 NOTE — Patient Outreach (Signed)
[place] [facility]) [place] Telephone Outreach PCP office completes Transition of Care follow up post-hospital discharge Post-hospital discharge day # 3 Unsuccessful consecutive telephone Outreach attempt # 1- new patient  06/03/2019  [name] 1950-12-11 [id number]   11:30 am: Unsuccessful telephone outreach to Ashley [name], 68 y/o female referred to [place] by [facility] Liaison RN CM after recent hospitalization July 21-25, 2020 for acute on chronic CHF exacerbation.  Patient has history including, but not limited to, combined CHF; CAD with previous MI/ CABG, and ICD; HTN/ HLD; GERD; COPD; ongoing tobaccos use.  Female person answering phone states patient is not available, as she has left to run errands; shared with this person that I would re-attempt call later this afternoon or tomorrow; person answering phone stated he would relay message to patient.  Plan:  Will place [facility] CM unsuccessful patient outreach letter in mail requesting call back in writing  Will re-attempt [place] telephone outreach again tomorrow if I do not hear back from patient first.  [name], RN, BSN, [employer] Coordinator [facility] Care Management  [phone]

## 2019-06-03 NOTE — Telephone Encounter (Signed)
Contacted Medication Management Clinic at [facility]. They can help this patient with obtaining medications until Open Enrollment period when she is able to sign up for a Part D Plan. I passed the patient's information along to Langley Adie, PharmD there.   Dr. Derrel Nip, please e-scribe the following to "Medication Mgmt" (I have added to preferred pharmacies): -Atorvastatin -Advair -Carvedilol -Clopidogrel -Entresto -Escitalopram -Furosemide -Omeprazole -Potassium -Spiriva -Trazodone -Ventolin  They will let us know if they do not have any of the above in stock and would need to switch to alternatives

## 2019-06-03 NOTE — Telephone Encounter (Signed)
Transition Care Management Follow-up Telephone Call  How have you been since you were released from the hospital? Patient is not able to get any of prescriptions due to loss of prescription coverage, I have contacted [name] and put in CCM referral.  I will also make some calls to other community resources. Patient say she feels better but is concerned with no medication she will end up back in the hospital.   Do you understand why you were in the hospital? yes   Do you understand the discharge instrcutions? yes  Items Reviewed:  Medications reviewed: yes  Allergies reviewed: yes  Dietary changes reviewed: yes  Referrals reviewed: yes   Functional Questionnaire:   Activities of Daily Living (ADLs):   She states they are independent in the following: ambulation, bathing and hygiene, feeding, continence, grooming, toileting and dressing States they require assistance with the following: Medications noinsurance.   Any transportation issues/concerns?: no   Any patient concerns? no   Confirmed importance and date/time of follow-up visits scheduled: yes   Confirmed with patient if condition begins to worsen call PCP or go to the ER.  Patient was given the Call-a-Nurse line [phone]: yes

## 2019-06-03 NOTE — Patient Instructions (Signed)
Visit Information  Goals Addressed            This Visit's Progress     Patient Stated   . "I can't afford my medications and I'm out" (pt-stated)       Current Barriers:  . Financial concerns - patient notes that she was unable to afford her Medicare Part D premium, so lost coverage. She reports that she cannot afford her medications and has been out of most everything for ~1.5 weeks, except the days she was admitted.  . Notes that she has furosemide and carvedilol, but nothing else.   Pharmacist Clinical Goal(s):  [name] Over the next 30 days, patient will work with PharmD to address needs related to medication access  Interventions: . Comprehensive medication review performed. [facility]. They can supply medications to the patient until her Part D plan takes effect with Open Enrollment. They assisted patient in completing Medicare Extra Help/LIS application today. Collaborated with Langley Adie, PharmD. Collaborated with Dr. Derrel Nip - sent list of which medications need to be e-scribed to Medication Mgmt Clinic. PharmD at [facility] transferred medications from [facility], but needs a refill on ferrous sulfate and nitroglycerin to Medication Mgmt Clinic from Dr. Derrel Nip. [name] [facility] outreached Heart Failure clinic to alert to medication cost concerns and to see if Entresto samples are in stock. Will collaborate with them to pursue patient assistance.  [name] [place] does not have Spiriva - have messaged them to see if they can supply Incruse instead. Will pursue patient assistance for inhaler therapy for a long term solution.  Patient Self Care Activities:  . Self administers medications as prescribed . Calls pharmacy for medication refills  Initial goal documentation        Ashley [name] was given information about Chronic Care Management services today including:  1. CCM service includes personalized support from designated clinical staff supervised by her physician,  including individualized plan of care and coordination with other care providers 2. 24/7 contact phone numbers for assistance for urgent and routine care needs. 3. Service will only be billed when office clinical staff spend 20 minutes or more in a month to coordinate care. 4. Only one practitioner may furnish and bill the service in a calendar month. 5. The patient may stop CCM services at any time (effective at the end of the month) by phone call to the office staff. 6. The patient will be responsible for cost sharing (co-pay) of up to 20% of the service fee (after annual deductible is met).  Patient agreed to services and verbal consent obtained.   The patient verbalized understanding of instructions provided today and declined a print copy of patient instruction materials.    Plan: - Will outreach patient on Thursday to ensure she was able to pick up medications, and to begin the process for patient assistance.   [name] Darnelle Maffucci, PharmD Clinical Pharmacist [place] [phone]

## 2019-06-04 ENCOUNTER — Other Ambulatory Visit: Payer: Self-pay | Admitting: Internal Medicine

## 2019-06-04 ENCOUNTER — Encounter: Payer: Self-pay | Admitting: *Deleted

## 2019-06-04 ENCOUNTER — Other Ambulatory Visit: Payer: Self-pay | Admitting: *Deleted

## 2019-06-04 MED ORDER — BREO ELLIPTA 100-25 MCG/INH IN AEPB: 1.0000 | INHALATION_SPRAY | Freq: Every day | RESPIRATORY_TRACT | 0 refills | Status: AC

## 2019-06-04 NOTE — Patient Outreach (Signed)
[place] [facility]) [place] Telephone Outreach PCP completes Transition of Care follow up post-hospital discharge Post-hospital discharge day # 4  06/04/2019  MYAN LOCATELLI 08-15-1951 [id number]  Successful telephone outreach to [name], 68 y/o female referred to [place] by [facility] Liaison RN CM after recent hospitalization July 21-25, 2020 for acute on chronic CHF exacerbation.  Patient was discharged from hospital to home/ self-care without home health services in place.  Patient has history including, but not limited to, combined CHF; CAD with previous MI/ CABG, and ICD; HTN/ HLD; GERD; COPD; ongoing tobacco use.  HIPAA/ identity verified; [place] services were discussed with patient and patient provided verbal consent for [facility] CM involvement in her care.  Patient provided verbal consent for [facility] CM team to speak with her fiancee Dareen Piano, on [place] dated 02/14/17 "at any time" if necessary/ indicated.  Today, patient reports that she "is doing much better," post-recent hospital discharge and she denies pain and new/ recent falls.  Patient sounds to be in no distress throughout phone call today; pleasant 55 minute phone call.  Patient further reports:  Medications: -- Has all medicationsand takes as prescribed;denies questions/ concerns around current medications; states that she has spoken with [facility] Pharmacist [name] and confirms that [name] is handling her previously stated medication concern/ need for financial assistance with medication.  Encouraged patient to maintain contact with [name] and confirmed that she has [name]'s contact information -- Verbalizes good general understanding of the purpose, dosing, and scheduling of medications- confirms today that she has and is taking antibiotic as instructed post- recent hospital discharge.   -- self-manage medications using weekly pill planner box- states her fiancee  assists in this process by reminding her to take medications as indicated. -- denies issues with swallowing medications -- patient was recently discharged from the hospital and I confirmed that medications were thoroughly reviewed with patient by [facility] CM Pharmacist [name] yesterday.  Provider appointments: -- All upcoming provider appointments were reviewed with patient today; patient verbalizes accurate understanding of all scheduled appointments and verbalizes plans to attend all-- states that her fiancee will provide transportation to all scheduled appointments and adds that she hopes to resume driving self in near future ---- Mon 06/09/2019:  Telephone visit with PCP ---- Tues 06/10/2019: CHF clinic ---- Thurs 06/19/2019: lab appointment/ post-hospital discharge lab work  [employer] Mob[employer]/ Falls: -- denies new/ recent falls; states has not had any falls over last 12 months -- assistive devices: occasionally uses cane when she goes out of home, as she has activity intolerance and shortness of breath as baseline, especially in hot weather -- general fall risks/ prevention education discussed with patient today  Holiday representative needs: -- currently denies community resource needs, stating supportive fiancee that assists with care needs as indicated; lives with fiancee and his brother -- fiancee provides transportation for patient to all provider appointments, errands, etc -- SDOH completed for: depression, transportation, food insecurity: patient denies needs in all areas -- currently getting food stamps  Advanced Directive (AD) Planning:   --reports does not currently have exisisting AD in place for HCPOA, living will. Was provided information at time of recent hospitalization, states hoping to complete soon; discussed with patient basics of Advanced directive planning and encouraged her to contact me for any ongoing questions she might have as she considers completing documents; patient  is agreeable  Self-health management of chronic disease state of CHF : -- patient  verbalizes a very good understanding of her current state of health/ chronic conditions -- patient has established baseline of daily weight monitoring and recording at home, and is able to verbalize rationale for/ importance of weight monitoring at home, along with appropriate action plan/ weight gain guidelines in setting of CHF -- reports biggest issue with self-health management of CHF is activity intolerance, especially in hot weather; states "becomes more short of breath" than she "normally is otherwise" -- currently not on O2/ CPAP at home- hopes to avoid -- follows "heart healthy/ low salt" diet; reports rare dietary indiscretion  Patient denies further issues, concerns, or problems today.  I provided/ confirmed that patient has my direct phone number, the main [facility] CM office phone number, and the [facility] CM 24-hour nurse advice phone number should issues arise prior to next scheduled [place] outreach in 2 weeks.  Encouraged patient to contact me directly if needs, questions, issues, or concerns arise prior to next scheduled outreach; patient agreed to do so.  Plan:  Patient will take medications as prescribed and will attend all scheduled provider appointments  Patient will promptly notify care providers for any new concerns/ issues/ problems that arise  Patient will continue monitoring/ recording daily weights   I will make patient's PCP aware of [place] RN CM involvement in patient's care-- will send barriers letter  Will mail patient [facility] CM Welcome letter/ packet  [place] outreach to continue with scheduled phone call in 2 weeks, post scheduled provider appointments  [facility] CM Care Plan Problem One     Most Recent Value  Care Plan Problem One  High risk for hospital readmission related to/ as evidenced by recent hospitalization for CHF exacerbation July 21-25, 2020  Role  Documenting the Problem One  Care Management [place] for Problem One  Active  [facility] Long Term Goal   Over the next 31 days, patient will not experience unplanned hospital readmission, as evidenced by patient reporting and review of EMR during [place] Term Goal Start Date  06/04/19  Interventions for Problem One Long Term Goal  Discussed with patient her understanding of recent hospitalization and reviewed post-hospital discharge instructions with her,  discussed current clinical condition and confirmed that patient does not have any current clinical concerns,  initiated [place] CM program  [facility] CM Short Term Goal #1   Over the next 30 days, patient will continue to engage with [place] team around stated medication need for assistance, as evidenced by patient reporting and collaboration with [facility] Pharmacist as indicated during [place] outreach  [facility] CM Short Term Goal #1 Start Date  06/04/19  Interventions for Short Term Goal #1  Confirmed that patient has spoken with and engaged with [facility] Pharmacist to have her medication concerns addressed,  confirmed that patient is taking post-hospital discharge antibiotics as instructed,  encouraged patient to maintain communication with [place] team  [facility] CM Short Term Goal #2   Over the next 30 days, patient will attend all scheduled provider appointments as evidenced by patient reporting and review of EMR/ collaboration with care providers as indicated during [place] outreach  [facility] CM Short Term Goal #2 Start Date  06/04/19  Interventions for Short Term Goal #2  Reviewed with patient all upcoming scheduled provider appointments and confirmed that patient has reliable transportation and plans to attend all as scheduled,  encouraged patient to promptly notify care providers for  any new concerns/ issues/ problems that arise    [facility] CM Care Plan Problem Two     Most Recent Value  Care Plan Problem Two  Need for  ongoing reinforcement of self-health management strategies for chronic disease state of CHF, as evidenced by patient reporting and recent hospitalization for CHF exacerbation  Role Documenting the Problem Two  Care Management Coordinator  Care Plan for Problem Two  Active  Interventions for Problem Two Long Term Goal   Discussed with patient her current understanding of CHF/ fluid management and confirmed that patient has established practice of daily weight monitoring and recording at home,  discussed basics of rationale for daily weight monitoring/ recording at home and confirmed that patient is able to verbalize accurate understanding of weight gain guidelines in setting of CHF along with corresponding action plan  [facility] Long Term Goal  Over the next 60 days, patient will continue to monitor and record daily weights at home, as evidenced by patient reporting and review of same during [place] RN CM outreach  [place] Term Goal Start Date  06/04/19     I appreciate the opportunity to participate in [place] care,  [name], RN, BSN, [employer] Coordinator [facility] Care Management  [phone]

## 2019-06-04 NOTE — Progress Notes (Signed)
b re

## 2019-06-05 ENCOUNTER — Ambulatory Visit: Payer: Medicare Other | Admitting: Pharmacist

## 2019-06-05 DIAGNOSIS — I5032 Chronic diastolic (congestive) heart failure: Secondary | ICD-10-CM

## 2019-06-05 NOTE — Discharge Summary (Signed)
Ashley [name], is a 68 y.o. female  DOB 03-17-1951  MRN [id number].  Admission date:  05/27/2019  Admitting Physician  Christel Mormon, MD  Discharge Date:  05/31/2019   Primary MD  [name], MD  Recommendations for primary care physician for things to follow:   Follow with Dr.[name]  in 1 week   Admission Diagnosis  Hypoxia [R09.02] Elevated troponin [R79.89] Acute on chronic congestive heart failure, unspecified heart failure type ([place]) [I50.9]   Discharge Diagnosis  Hypoxia [R09.02] Elevated troponin [R79.89] Acute on chronic congestive heart failure, unspecified heart failure type ([place]) [I50.9]    Active Problems:   Acute on chronic combined systolic and diastolic CHF (congestive heart failure) ([place])      Past Medical History:  Diagnosis Date  . AICD (automatic cardioverter/defibrillator) present    on right side  . Anemia   . CAD (coronary artery disease)    s/p CABG  . CHF (congestive heart failure) ([place])   . Chronic kidney disease    renal artery stenosis  . Colon cancer ([place])   . Depression   . GERD (gastroesophageal reflux disease)   . Hx of colonic polyps   . Hyperlipidemia   . Hypertension   . Mitral valve disorder    s/p mitral valve repair wth CABG  . Myocardial infarction ([place])   . Peripheral vascular disease ([place])   . Presence of permanent cardiac pacemaker    Pacemaker/ Defibrillator    Past Surgical History:  Procedure Laterality Date  . arm surgery     fracture, has plates and screws  . CABG with mitral valve repair    . CARDIAC CATHETERIZATION    . COLON SURGERY     colon cancer  . COLONOSCOPY WITH PROPOFOL N/A 10/09/2016   Procedure: COLONOSCOPY WITH PROPOFOL;  Surgeon: Jonathon Bellows, MD;  Location: [facility] ENDOSCOPY;  Service: Endoscopy;  Laterality: N/A;  . COLONOSCOPY WITH  PROPOFOL N/A 07/24/2018   Procedure: COLONOSCOPY WITH PROPOFOL;  Surgeon: Virgel Manifold, MD;  Location: [facility] ENDOSCOPY;  Service: Endoscopy;  Laterality: N/A;  . CORONARY ARTERY BYPASS GRAFT    . ENTEROSCOPY N/A 11/21/2018   Procedure: ENTEROSCOPY-BALLOON;  Surgeon: Jonathon Bellows, MD;  Location: [facility] ENDOSCOPY;  Service: Gastroenterology;  Laterality: N/A;  . ESOPHAGOGASTRODUODENOSCOPY (EGD) WITH PROPOFOL N/A 07/24/2018   Procedure: ESOPHAGOGASTRODUODENOSCOPY (EGD) WITH PROPOFOL;  Surgeon: Virgel Manifold, MD;  Location: [facility] ENDOSCOPY;  Service: Endoscopy;  Laterality: N/A;  . GIVENS CAPSULE STUDY N/A 08/16/2018   Procedure: GIVENS CAPSULE STUDY;  Surgeon: Virgel Manifold, MD;  Location: [facility] ENDOSCOPY;  Service: Endoscopy;  Laterality: N/A;  . pace maker defib  2016  . PERIPHERAL VASCULAR CATHETERIZATION N/A 10/23/2016   Procedure: Renal Angiography;  Surgeon: Algernon Huxley, MD;  Location: [facility];  Service: Cardiovascular;  Laterality: N/A;  . TOTAL ABDOMINAL HYSTERECTOMY  1999   history of abnormal pap       History of present illness and  Hospital Course:     Kindly see H&P for history of present illness and admission details, please review complete Labs, Consult reports and Test reports for all details in brief  HPI  from the history and physical done on the day of admission 68  Yr old female with multiple medical problems  Of DMII,htn,depression admitted for DKA./respiratory failure with hypoxia.   Hospital Course  Acute hypoxic respiratory failure secondary to acute on chronic diastolic CHF.  Recent ECHO with EF 12-75% and diastolic  dysfunction.  -Patient had desaturations to 87% on room air during hospital stay,but improved, with  IV lasix. -Repeat ECHO with EF 45% and severe dilatation and hypokinesis of the left ventriclle -Continue Coreg and Entresto Weaned off o2.  Salmonella gastroenteritis- GI pathogen panel positive for salmonella. Patient is  having persistent diarrhea. PCT trending down. -Continued on  Levaquin,discharged home with levaquin for 3 days,her diarrhea completely resolved at tthe time of discharge. told her to check her well water, and disinfect.she drinks well water. Uncontrolled type 2 diabetes- patient initially in DKA, but this has resolved. A1c is 5.5%. -Continue Lantus and moderate SSI  Hypertension- BP has improved. -Continue home BP meds  CAD s/p CABG -Continue Coreg, aspirin, Plavix, statin  Microcytic anemia-hemoglobin at baseline. -Folate and Vitamin B12 were normal    Note: This dictation was prepared with Dragon dictation along with smaller phrase technology. Any transcriptional errors that result from this process are unintentional.    dischargee Condition: stable   Follow UP  Follow-up Information    [facility] Follow up on 06/10/2019.   Specialty: Cardiology Why: at 12:00pm Contact information: [address] [phone]       [name], MD. Schedule an appointment as soon as possible for a visit in 1 week(s).   Specialty: Internal Medicine Contact information: [address] [place] [address] [phone]             Discharge Instructions  and  Discharge Medications    Allergies as of 05/31/2019      Reactions   Hydrocodone-acetaminophen Nausea Only      Medication List    TAKE these medications   acetaminophen 500 MG tablet Commonly known as: TYLENOL Maximum 6 tablets a day. What changed:   how much to take  how to take this  when to take this  reasons to take this   ALPRAZolam 0.5 MG tablet Commonly known as: XANAX Take 0.5-1 tablets (0.25-0.5 mg total) by mouth at bedtime. TAKE ONE TABLET BY MOUTH AT BEDTIME AS NEEDED FOR ANXIETY   aspirin 81 MG tablet Take 81 mg by mouth daily.   cyanocobalamin 1000 MCG/ML  injection Commonly known as: (VITAMIN B-12) Inject 1 mL weekly into the muscle monthly   Diphenhist 25 mg capsule Generic drug: diphenhydrAMINE Take 25 mg by mouth at bedtime as needed.   ferrous sulfate 325 (65 FE) MG EC tablet Take 325 mg by mouth daily with breakfast.   ipratropium-albuterol 0.5-2.5 (3) MG/3ML Soln Commonly known as: DUONEB Take 3 mLs by nebulization every 6 (six) hours as needed.   levofloxacin 500 MG tablet Commonly known as: LEVAQUIN Take 1 tablet (500 mg total) by mouth daily.   nitroGLYCERIN 0.4 MG SL tablet Commonly known as: NITROSTAT Place 1 tablet (0.4 mg total) under the tongue every 5 (five) minutes as needed for chest pain.         Diet and Activity recommendation: See Discharge Instructions above   Consults obtained - intensivist,cardiology   Major procedures and Radiology Reports - PLEASE review detailed and final reports for all details, in brief -      Dg Chest 1 View  Result Date: 05/29/2019 CLINICAL DATA:  Fever. EXAM: CHEST  1 VIEW COMPARISON:  05/29/2019 FINDINGS: The patient has RIGHT-sided transvenous pacemaker leads to the RIGHT atrium, RIGHT ventricle, and coronary sinus. Status post median sternotomy. Heart is enlarged. There are prominent interstitial  markings consistent with interstitial pulmonary edema. No overt alveolar edema. No consolidations or pleural effusions. Remote ORIF of the LEFT clavicle. IMPRESSION: Cardiomegaly and interstitial pulmonary edema. Electronically Signed   By: Nolon Nations M.D.   On: 05/29/2019 10:07   Dg Chest 2 View  Result Date: 05/28/2019 CLINICAL DATA:  Hypoxia. EXAM: CHEST - 2 VIEW COMPARISON:  05/27/2019, 03/16/2019 and 11/08/2018 FINDINGS: Chronic cardiomegaly. CABG. AICD in place. Aortic atherosclerosis. There is slight diffuse accentuation of the interstitial markings with tiny bilateral pleural effusions. The pulmonary vascularity is now within normal limits. The pulmonary edema and  effusions have improved since the prior study. Vascular congestion has resolved IMPRESSION: 1. Improving pulmonary edema and effusions. 2. Aortic atherosclerosis. Electronically Signed   By: Lorriane Shire M.D.   On: 05/28/2019 13:53   Dg Chest Port 1 View  Result Date: 05/29/2019 CLINICAL DATA:  Patient admitted 05/27/2019 with respiratory distress. EXAM: PORTABLE CHEST 1 VIEW COMPARISON:  PA and lateral chest 05/28/2019 and 11/08/2018. Single-view of the chest 05/27/2019. FINDINGS: The patient is status post CABG with a pacing device in place. Cardiomegaly and atherosclerosis again seen. Pulmonary edema appears slightly worse than on the most recent comparison. Trace pleural effusions noted. No pneumothorax. No acute bony abnormality. IMPRESSION: Mildly increased pulmonary edema since the most recent exam. Cardiomegaly. Trace pleural effusions. Atherosclerosis. Electronically Signed   By: Inge Rise M.D.   On: 05/29/2019 07:39   Dg Chest Portable 1 View  Result Date: 05/27/2019 CLINICAL DATA:  Shortness of breath EXAM: PORTABLE CHEST 1 VIEW COMPARISON:  03/16/2019 FINDINGS: cardiomegaly and CABG. Biventricular pacer leads from the right in stable position. Diffuse interstitial opacity, vascular pedicle widening, and small pleural effusions. IMPRESSION: CHF. Electronically Signed   By: Monte Fantasia M.D.   On: 05/27/2019 04:51    Micro Results     Recent Results (from the past 240 hour(s))  SARS Coronavirus 2 (CEPHEID- Performed in [facility] hospital lab), Hosp Order     Status: None   Collection Time: 05/27/19  5:16 AM   Specimen: Nasopharyngeal Swab  Result Value Ref Range Status   SARS Coronavirus 2 NEGATIVE NEGATIVE Final    Comment: (NOTE) If result is NEGATIVE SARS-CoV-2 target nucleic acids are NOT DETECTED. The SARS-CoV-2 RNA is generally detectable in upper and lower  respiratory specimens during the acute phase of infection. The lowest  concentration of SARS-CoV-2 viral  copies this assay can detect is 250  copies / mL. A negative result does not preclude SARS-CoV-2 infection  and should not be used as the sole basis for treatment or other  patient management decisions.  A negative result may occur with  improper specimen collection / handling, submission of specimen other  than nasopharyngeal swab, presence of viral mutation(s) within the  areas targeted by this assay, and inadequate number of viral copies  (<250 copies / mL). A negative result must be combined with clinical  observations, patient history, and epidemiological information. If result is POSITIVE SARS-CoV-2 target nucleic acids are DETECTED. The SARS-CoV-2 RNA is generally detectable in upper and lower  respiratory specimens dur ing the acute phase of infection.  Positive  results are indicative of active infection with SARS-CoV-2.  Clinical  correlation with patient history and other diagnostic information is  necessary to determine patient infection status.  Positive results do  not rule out bacterial infection or co-infection with other viruses. If result is PRESUMPTIVE POSTIVE SARS-CoV-2 nucleic acids MAY BE PRESENT.   A presumptive positive  result was obtained on the submitted specimen  and confirmed on repeat testing.  While 2019 novel coronavirus  (SARS-CoV-2) nucleic acids may be present in the submitted sample  additional confirmatory testing may be necessary for epidemiological  and / or clinical management purposes  to differentiate between  SARS-CoV-2 and other Sarbecovirus currently known to infect humans.  If clinically indicated additional testing with an alternate test  methodology [phone]) is advised. The SARS-CoV-2 RNA is generally  detectable in upper and lower respiratory sp ecimens during the acute  phase of infection. The expected result is Negative. Fact Sheet for Patients:  StrictlyIdeas.no Fact Sheet for Healthcare  Providers: BankingDealers.co.za This test is not yet approved or cleared by the [place] FDA and has been authorized for detection and/or diagnosis of SARS-CoV-2 by FDA under an Emergency Use Authorization (EUA).  This EUA will remain in effect (meaning this test can be used) for the duration of the COVID-19 declaration under Section 564(b)(1) of the Act, 21 U.S.C. section 360bbb-3(b)(1), unless the authorization is terminated or revoked sooner. Performed at [facility], [address] [place], [place] [address]   MRSA PCR Screening     Status: None   Collection Time: 05/27/19  9:28 AM   Specimen: Nasal Mucosa; Nasopharyngeal  Result Value Ref Range Status   MRSA by PCR NEGATIVE NEGATIVE Final    Comment:        The GeneXpert MRSA Assay (FDA approved for NASAL specimens only), is one component of a comprehensive MRSA colonization surveillance program. It is not intended to diagnose MRSA infection nor to guide or monitor treatment for MRSA infections. Performed at [facility], [address] [place], [place] [address]   CULTURE, BLOOD (ROUTINE X 2) w Reflex to ID Panel     Status: None   Collection Time: 05/29/19  8:23 AM   Specimen: BLOOD  Result Value Ref Range Status   Specimen Description BLOOD LEFT HAND  Final   Special Requests   Final    BOTTLES DRAWN AEROBIC AND ANAEROBIC Blood Culture results may not be optimal due to an excessive volume of blood received in culture bottles   Culture   Final    NO GROWTH 5 DAYS Performed at [facility], [address] [place], [place] [address]    Report Status 06/03/2019 FINAL  Final  CULTURE, BLOOD (ROUTINE X 2) w Reflex to ID Panel     Status: None   Collection Time: 05/29/19  8:33 AM   Specimen: BLOOD  Result Value Ref Range Status   Specimen Description BLOOD RIGHT [place]  Final   Special Requests   Final    BOTTLES DRAWN AEROBIC AND ANAEROBIC Blood Culture results  may not be optimal due to an excessive volume of blood received in culture bottles   Culture   Final    NO GROWTH 5 DAYS Performed at [facility], [address] [place], [place] [address]    Report Status 06/03/2019 FINAL  Final  Gastrointestinal Panel by PCR , Stool     Status: Abnormal   Collection Time: 05/29/19 11:09 AM   Specimen: STOOL  Result Value Ref Range Status   Campylobacter species NOT DETECTED NOT DETECTED Final   Plesimonas shigelloides NOT DETECTED NOT DETECTED Final   Salmonella species DETECTED (A) NOT DETECTED Final    Comment: RESULT CALLED TO, READ BACK BY AND VERIFIED WITH: TAMMY TODD @1650  05/29/19 MJU    Yersinia enterocolitica NOT DETECTED NOT  DETECTED Final   Vibrio species NOT DETECTED NOT DETECTED Final   Vibrio cholerae NOT DETECTED NOT DETECTED Final   Enteroaggregative E coli (EAEC) NOT DETECTED NOT DETECTED Final   Enteropathogenic E coli (EPEC) NOT DETECTED NOT DETECTED Final   Enterotoxigenic E coli (ETEC) NOT DETECTED NOT DETECTED Final   Shiga like toxin producing E coli (STEC) NOT DETECTED NOT DETECTED Final   Shigella/Enteroinvasive E coli (EIEC) NOT DETECTED NOT DETECTED Final   Cryptosporidium NOT DETECTED NOT DETECTED Final   Cyclospora cayetanensis NOT DETECTED NOT DETECTED Final   Entamoeba histolytica NOT DETECTED NOT DETECTED Final   Giardia lamblia NOT DETECTED NOT DETECTED Final   Adenovirus F40/41 NOT DETECTED NOT DETECTED Final   Astrovirus NOT DETECTED NOT DETECTED Final   Norovirus GI/GII NOT DETECTED NOT DETECTED Final   Rotavirus A NOT DETECTED NOT DETECTED Final   Sapovirus (I, II, IV, and V) NOT DETECTED NOT DETECTED Final    Comment: Performed at [facility], [address] [place], [place] [address]  C difficile quick scan w PCR reflex     Status: None   Collection Time: 05/29/19  2:41 PM   Specimen: STOOL  Result Value Ref Range Status   C Diff antigen NEGATIVE NEGATIVE Final   C Diff toxin  NEGATIVE NEGATIVE Final   C Diff interpretation No C. difficile detected.  Final    Comment: Performed at [facility], [address] [place], [place] [address]       Today   Subjective:   Ashley [name] today has no headache,no chest abdominal pain,no new weakness tingling or numbness, feels much better wants to go home today.   Objective:   Blood pressure 124/72, pulse 82, temperature 98 F (36.7 C), temperature source Oral, resp. rate 20, height 5\' 3"  (1.6 m), weight 74.2 kg, SpO2 94 %.  No intake or output data in the 24 hours ending 06/05/19 1554  Exam Awake Alert, Oriented x 3, No new F.N deficits, Normal affect [place].AT,PERRAL Supple Neck,No JVD, No cervical lymphadenopathy appriciated.  Symmetrical Chest wall movement, Good air movement bilaterally, CTAB RRR,No Gallops,Rubs or new Murmurs, No Parasternal Heave +ve B.Sounds, Abd Soft, Non tender, No organomegaly appriciated, No rebound -guarding or rigidity. No Cyanosis, Clubbing or edema, No new Rash or bruise  Data Review   CBC w Diff:  Lab Results  Component Value Date   WBC 8.2 05/31/2019   HGB 10.8 (L) 05/31/2019   HGB 12.8 08/20/2014   HCT 31.7 (L) 05/31/2019   HCT 37.3 08/20/2014   PLT 235 05/31/2019   PLT 278 08/20/2014   LYMPHOPCT 12 05/31/2019   LYMPHOPCT 19.7 08/20/2014   MONOPCT 15 05/31/2019   MONOPCT 10.6 08/20/2014   EOSPCT 2 05/31/2019   EOSPCT 2.3 08/20/2014   BASOPCT 1 05/31/2019   BASOPCT 1.1 08/20/2014    CMP:  Lab Results  Component Value Date   NA 137 05/31/2019   NA 141 10/27/2014   K 3.2 (L) 05/31/2019   K 4.0 10/27/2014   CL 102 05/31/2019   CL 108 (H) 10/27/2014   CO2 28 05/31/2019   CO2 27 10/27/2014   BUN 18 05/31/2019   BUN 12 10/27/2014   CREATININE 1.14 (H) 05/31/2019   CREATININE 1.35 (H) 07/01/2018   PROT 6.7 05/27/2019   PROT 6.8 08/18/2014   ALBUMIN 3.7 05/27/2019   ALBUMIN 3.3 (L) 08/18/2014   BILITOT 2.2 (H) 05/27/2019   BILITOT 0.4  08/18/2014   ALKPHOS 88 05/27/2019  ALKPHOS 122 (H) 08/18/2014   AST 69 (H) 05/27/2019   AST 83 (H) 08/18/2014   ALT 36 05/27/2019   ALT 65 (H) 08/18/2014  .   Total Time in preparing paper work, data evaluation and todays exam - 35 minutes  Epifanio Lesches M.D on 05/31/2019 at 3:54 PM    Note: This dictation was prepared with Dragon dictation along with smaller phrase technology. Any transcriptional errors that result from this process are unintentional.

## 2019-06-05 NOTE — Patient Instructions (Signed)
Visit Information  Goals Addressed            This Visit's Progress     Patient Stated   . "I can't afford my medications and I'm out" (pt-stated)       Current Barriers:  . Financial concerns - patient notes that she was unable to afford her Medicare Part D premium, so lost coverage. [facility] helped her apply for LIS, so her Part D plan will go back into effect in 6-8 weeks once this is approved.  . In the meantime, they have been able to help her with all medications except Spiriva. They do not have Incruse in stock either. Patient will qualify for assistance from [place], at least for a temporary 30 day supply.   Pharmacist Clinical Goal(s):  [name] Over the next 30 days, patient will work with PharmD to address needs related to medication access  Interventions: . [place] application for [employer]. Have placed patient portion at the front desk of clinic for her signature, and she will leave proof of her income. Will collaborate with Dr. Derrel Nip for her signature.   Patient Self Care Activities:  . Self administers medications as prescribed . Calls pharmacy for medication refills  Please see past updates related to this goal by clicking on the "Past Updates" button in the selected goal         The patient verbalized understanding of instructions provided today and declined a print copy of patient instruction materials.   Plan:  - Will work with patient on Spiriva patient assistance. Once all parts are received, will submit and pass along to [employer], CPhT for processing  [name] Darnelle Maffucci, PharmD, [place] Pharmacist [place] [phone]

## 2019-06-05 NOTE — Chronic Care Management (AMB) (Signed)
  Chronic Care Management   Follow Up Note   06/05/2019 Name: Ashley [name] MRN: [id number] DOB: 07-04-1951  Referred by: [name], MD Reason for referral : Chronic Care Management (Medication Management)   Ashley [name] is a 68 y.o. year old female who is a primary care patient of [name], [name], MD. The CCM team was consulted for assistance with chronic disease management and care coordination needs.    Contacted patient today to follow up on medication access. Review of patient status, including review of consultants reports, relevant laboratory and other test results, and collaboration with appropriate care team members and the patient's provider was performed as part of comprehensive patient evaluation and provision of chronic care management services.    Goals Addressed            This Visit's Progress     Patient Stated   . "I can't afford my medications and I'm out" (pt-stated)       Current Barriers:  . Financial concerns - patient notes that she was unable to afford her Medicare Part D premium, so lost coverage. [facility] helped her apply for LIS, so her Part D plan will go back into effect in 6-8 weeks once this is approved.  . In the meantime, they have been able to help her with all medications except Spiriva. They do not have Incruse in stock either. Patient will qualify for assistance from [place], at least for a temporary 30 day supply.   Pharmacist Clinical Goal(s):  [name] Over the next 30 days, patient will work with PharmD to address needs related to medication access  Interventions: . [place] application for [employer]. Have placed patient portion at the front desk of clinic for her signature, and she will leave proof of her income. Will collaborate with Dr. Derrel Nip for her signature.   Patient Self Care Activities:  . Self administers medications as prescribed . Calls pharmacy for medication refills   Please see past updates related to this goal by clicking on the "Past Updates" button in the selected goal          Plan:  - Will work with patient on Spiriva patient assistance. Once all parts are received, will submit and pass along to [employer], CPhT for processing  [name] Darnelle Maffucci, PharmD, [place] Pharmacist [place] [phone]

## 2019-06-09 ENCOUNTER — Ambulatory Visit
Admission: RE | Admit: 2019-06-09 | Discharge: 2019-06-09 | Disposition: A | Discharge status: Home / Self Care | Payer: Medicare Other | Source: Ambulatory Visit | Attending: Internal Medicine | Admitting: Internal Medicine

## 2019-06-09 ENCOUNTER — Encounter: Payer: Self-pay | Admitting: Internal Medicine

## 2019-06-09 ENCOUNTER — Other Ambulatory Visit: Payer: Self-pay

## 2019-06-09 ENCOUNTER — Ambulatory Visit (INDEPENDENT_AMBULATORY_CARE_PROVIDER_SITE_OTHER): Payer: Medicare Other | Admitting: Internal Medicine

## 2019-06-09 DIAGNOSIS — J9611 Chronic respiratory failure with hypoxia: Secondary | ICD-10-CM

## 2019-06-09 DIAGNOSIS — J9612 Chronic respiratory failure with hypercapnia: Secondary | ICD-10-CM

## 2019-06-09 DIAGNOSIS — R042 Hemoptysis: Secondary | ICD-10-CM | POA: Insufficient documentation

## 2019-06-09 DIAGNOSIS — I5043 Acute on chronic combined systolic (congestive) and diastolic (congestive) heart failure: Secondary | ICD-10-CM

## 2019-06-09 DIAGNOSIS — R0602 Shortness of breath: Secondary | ICD-10-CM | POA: Diagnosis not present

## 2019-06-09 DIAGNOSIS — R091 Pleurisy: Secondary | ICD-10-CM | POA: Diagnosis not present

## 2019-06-09 MED ORDER — IOPAMIDOL (ISOVUE-370) INJECTION 76%
75.0000 mL | Freq: Once | INTRAVENOUS | Status: AC | PRN
  Administered 2019-06-09: 16:00:00 75 mL via INTRAVENOUS

## 2019-06-09 NOTE — Assessment & Plan Note (Signed)
Secondary to years of tobacco abuse,  Ongoing.  ICS/LABA supplied by pharmacy for indigent status.  Prognosis poor given patient's lack of insight and continued tobacco abuse

## 2019-06-09 NOTE — Progress Notes (Signed)
Telephone Note This visit type was conducted due to national recommendations for restrictions regarding the COVID-19 pandemic (e.g. social distancing).  This format is felt to be most appropriate for this patient at this time.  All issues noted in this document were discussed and addressed.  No physical exam was performed (except for noted visual exam findings with Video Visits).   I connected with@ on 06/09/19 at 11:30 AM EDT by a video enabled telemedicine application or telephone and verified that I am speaking with the correct person using two identifiers. Location patient: home Location provider: work or home office Persons participating in the virtual visit: patient, provider  I discussed the limitations, risks, security and privacy concerns of performing an evaluation and management service by telephone and the availability of in person appointments. I also discussed with the patient that there may be a patient responsible charge related to this service. The patient expressed understanding and agreed to proceed.   Reason for visit: hospital follow up / new onset hemoptysis ,  Chest pain   HPI:  68 yr old female with CAD, ischemic cardiomyopathy sp AICD, C OPD, ongoing tobacco abuse  admitted with chest pain and diarrhea to [facility]  on July 21. Admission diagnostics included an elevated  High sensitivity troponin x 2 , interstitial pulmonary edema on plain chest films with BNP > 4000, ,  Negative COVID 19 VIRAL ANTIGEN TEST , and positive salmonella GI screen.  She was treated for  acute on chronic heart failure with diuretics and supplemental oxygen and Salmonella gastroenteritis/colitis with fluoroquinolone.  She was weaned of supplemental oxygen and transitioned to oral diuretics and abx.  The diarrhea had resolved prior to discharge. She was discharged on July 25 with daily lasix and 3 more days of Levaquin .    Since discharge she has not felt well.  She was initially "exhausted" from the  diarrhea  Stools have returned to normal and appetite is good.   For the last 4 days she has been short of breath with  Short walks and reports pleurisy.  Her sputum is notable for dark red blood.  She denies sinus drainage and her cough is intermittent. She continues to smoke but "only 2 or 3 daily because I am so short of breath." she has been weighing herself daily and has been taking 40 mg lasix daily  ,  With 2 additional doses since discharge for weight gain overnight.   ROS: See pertinent positives and negatives per HPI.  Past Medical History:  Diagnosis Date  . AICD (automatic cardioverter/defibrillator) present    on right side  . Anemia   . CAD (coronary artery disease)    s/p CABG  . CHF (congestive heart failure) ([place])   . Chronic kidney disease    renal artery stenosis  . Colon cancer ([place])   . Depression   . GERD (gastroesophageal reflux disease)   . Hx of colonic polyps   . Hyperlipidemia   . Hypertension   . Mitral valve disorder    s/p mitral valve repair wth CABG  . Myocardial infarction ([place])   . Peripheral vascular disease ([place])   . Presence of permanent cardiac pacemaker    Pacemaker/ Defibrillator    Past Surgical History:  Procedure Laterality Date  . arm surgery     fracture, has plates and screws  . CABG with mitral valve repair    . CARDIAC CATHETERIZATION    . COLON SURGERY     colon cancer  .  COLONOSCOPY WITH PROPOFOL N/A 10/09/2016   Procedure: COLONOSCOPY WITH PROPOFOL;  Surgeon: Jonathon Bellows, MD;  Location: [facility] ENDOSCOPY;  Service: Endoscopy;  Laterality: N/A;  . COLONOSCOPY WITH PROPOFOL N/A 07/24/2018   Procedure: COLONOSCOPY WITH PROPOFOL;  Surgeon: Virgel Manifold, MD;  Location: [facility] ENDOSCOPY;  Service: Endoscopy;  Laterality: N/A;  . CORONARY ARTERY BYPASS GRAFT    . ENTEROSCOPY N/A 11/21/2018   Procedure: ENTEROSCOPY-BALLOON;  Surgeon: Jonathon Bellows, MD;  Location: [facility] ENDOSCOPY;  Service: Gastroenterology;  Laterality: N/A;  .  ESOPHAGOGASTRODUODENOSCOPY (EGD) WITH PROPOFOL N/A 07/24/2018   Procedure: ESOPHAGOGASTRODUODENOSCOPY (EGD) WITH PROPOFOL;  Surgeon: Virgel Manifold, MD;  Location: [facility] ENDOSCOPY;  Service: Endoscopy;  Laterality: N/A;  . GIVENS CAPSULE STUDY N/A 08/16/2018   Procedure: GIVENS CAPSULE STUDY;  Surgeon: Virgel Manifold, MD;  Location: [facility] ENDOSCOPY;  Service: Endoscopy;  Laterality: N/A;  . pace maker defib  2016  . PERIPHERAL VASCULAR CATHETERIZATION N/A 10/23/2016   Procedure: Renal Angiography;  Surgeon: Algernon Huxley, MD;  Location: [place] CV LAB;  Service: Cardiovascular;  Laterality: N/A;  . TOTAL ABDOMINAL HYSTERECTOMY  1999   history of abnormal pap    Family History  Problem Relation Age of Onset  . Arthritis Mother   . Cancer Mother        uterus cancer  . Hyperlipidemia Mother   . Hypertension Mother   . Heart disease Mother   . Diabetes Mother   . Hyperlipidemia Father   . Hypertension Father   . Heart disease Father   . Diabetes Father   . Cancer Sister        ovary cancer  . Diabetes Maternal Grandmother   . Hypertension Maternal Grandmother   . Arthritis Maternal Grandmother   . Hypertension Maternal Grandfather   . Hypertension Paternal Grandmother   . Hypertension Paternal Grandfather   . Heart disease Paternal Grandfather   . Heart disease Brother   . Kidney disease Brother   . Breast cancer Neg Hx     SOCIAL HX:  reports that she has been smoking cigarettes. She has been smoking about 0.50 packs per day. She has never used smokeless tobacco. She reports current alcohol use of about 2.0 standard drinks of alcohol per week. She reports that she does not use drugs.   Current Outpatient Medications:  .  acetaminophen (TYLENOL) 500 MG tablet, Maximum 6 tablets a day. (Patient taking differently: Take 500 mg by mouth every 4 (four) hours as needed for fever. Maximum 6 tablets a day.), Disp: 42 tablet, Rfl: 0 .  albuterol (VENTOLIN HFA) 108 (90  Base) MCG/ACT inhaler, INHALE 2 PUFFS BY MOUTH INTO THE LUNGS EVERY 6 HOURS AS NEEDED FOR WHEEZING, Disp: 18 g, Rfl: 2 .  ALPRAZolam (XANAX) 0.5 MG tablet, Take 0.5-1 tablets (0.25-0.5 mg total) by mouth at bedtime. TAKE ONE TABLET BY MOUTH AT BEDTIME AS NEEDED FOR ANXIETY, Disp: 30 tablet, Rfl: 3 .  aspirin 81 MG tablet, Take 81 mg by mouth daily., Disp: , Rfl:  .  atorvastatin (LIPITOR) 40 MG tablet, Take 1 tablet (40 mg total) by mouth daily., Disp: 90 tablet, Rfl: 1 .  carvedilol (COREG) 6.25 MG tablet, TAKE ONE TABLET BY MOUTH TWICE DAILY WITH MEAL, Disp: 180 tablet, Rfl: 1 .  clopidogrel (PLAVIX) 75 MG tablet, Take 1 tablet (75 mg total) by mouth daily., Disp: 90 tablet, Rfl: 1 .  cyanocobalamin (,VITAMIN B-12,) 1000 MCG/ML injection, Inject 1 mL weekly into the muscle monthly, Disp: 10 mL,  Rfl: 0 .  diphenhydrAMINE (DIPHENHIST) 25 mg capsule, Take 25 mg by mouth at bedtime as needed. , Disp: , Rfl:  .  escitalopram (LEXAPRO) 10 MG tablet, Take 1 tablet (10 mg total) by mouth daily., Disp: 90 tablet, Rfl: 1 .  ferrous sulfate 325 (65 FE) MG EC tablet, Take 325 mg by mouth daily with breakfast., Disp: , Rfl:  .  fluticasone furoate-vilanterol (BREO ELLIPTA) 100-25 MCG/INH AEPB, Inhale 1 puff into the lungs daily., Disp: 90 each, Rfl: 0 .  Fluticasone-Salmeterol (ADVAIR DISKUS) 250-50 MCG/DOSE AEPB, INHALE 1 PUFF BY MOUTH INTO THE LUNGS EVERY 12 HOURS RINSE MOUTH AFTER EACH USE, Disp: 180 each, Rfl: 1 .  furosemide (LASIX) 40 MG tablet, Take 1 tablet (40 mg total) by mouth daily. 2 for weight gain > 2 lbs overnight, Disp: 135 tablet, Rfl: 1 .  ipratropium-albuterol (DUONEB) 0.5-2.5 (3) MG/3ML SOLN, Take 3 mLs by nebulization every 6 (six) hours as needed., Disp: 360 mL, Rfl: 1 .  levofloxacin (LEVAQUIN) 500 MG tablet, Take 1 tablet (500 mg total) by mouth daily., Disp: 7 tablet, Rfl: 0 .  nitroGLYCERIN (NITROSTAT) 0.4 MG SL tablet, Place 1 tablet (0.4 mg total) under the tongue every 5 (five)  minutes as needed for chest pain., Disp: 30 tablet, Rfl: 3 .  omeprazole (PRILOSEC) 40 MG capsule, Take 1 capsule (40 mg total) by mouth daily., Disp: 90 capsule, Rfl: 1 .  potassium chloride (K-DUR) 10 MEQ tablet, Take 1 tablet (10 mEq total) by mouth daily., Disp: 90 tablet, Rfl: 1 .  sacubitril-valsartan (ENTRESTO) 24-26 MG, Take 1 tablet by mouth 2 (two) times daily., Disp: 180 tablet, Rfl: 3 .  Tiotropium Bromide Monohydrate (SPIRIVA RESPIMAT) 2.5 MCG/ACT AERS, Inhale 5 mcg into the lungs every morning., Disp: 4 g, Rfl: 11 .  traZODone (DESYREL) 50 MG tablet, Take 0.5-1 tablets (25-50 mg total) by mouth daily. One hour before bedtime, Disp: 90 tablet, Rfl: 3  EXAM:  . General impression: alert, cooperative and articulate.  No signs of being in distress  Lungs: speech is fluent sentence length suggests that patient is not short of breath and not punctuated by cough, sneezing or sniffing. [name]   Psych: affect normal.  speech is articulate and non pressured .  Denies suicidal thoughts   ASSESSMENT AND PLAN:  Discussed the following assessment and plan:  Cough with hemoptysis New onset,  Started 3-4 days after discharge. In the setting of COPD, CAD on plavix,  Ongoing tobacco abuse, and recent admission for interstitial pulmonary edema.  DDX includes PE (patient did receive Lovenox in house) ,  Lung mass,  Sinus disease.  STAT CT angiogram ordered.  If it cannot be done today patient has been advised to return to ER for urgent evaluation   Acute on chronic combined systolic and diastolic CHF (congestive heart failure) ([place]) She has been weighing herself daily and taking an additional lasix dose prn wt gain of 2 lbs overnight.  She has taken 2 additional doses since discharge on July 25   Respiratory failure ([place]) Secondary to years of tobacco abuse,  Ongoing.  ICS/LABA supplied by pharmacy for indigent status.  Prognosis poor given patient's lack of insight and continued tobacco abuse       I discussed the assessment and treatment plan with the patient. The patient was provided an opportunity to ask questions and all were answered. The patient agreed with the plan and demonstrated an understanding of the instructions.   The patient was advised  to call back or seek an in-person evaluation if the symptoms worsen or if the condition fails to improve as anticipated.  I provided 30 minutes of non-face-to-face time during this encounter.   [name], MD

## 2019-06-09 NOTE — Assessment & Plan Note (Signed)
She has been weighing herself daily and taking an additional lasix dose prn wt gain of 2 lbs overnight.  She has taken 2 additional doses since discharge on July 25

## 2019-06-09 NOTE — Progress Notes (Signed)
Pt stated that about 3 or 4 days ago she started experiencing a dull ache in her chest and when she coughs the phlegm has blood in it.

## 2019-06-09 NOTE — Assessment & Plan Note (Addendum)
New onset,  Started 3-4 days after discharge. In the setting of COPD, CAD on plavix,  Ongoing tobacco abuse, and recent admission for interstitial pulmonary edema.  DDX includes PE (patient did receive Lovenox in house) ,  Lung mass,  Sinus disease.  STAT CT angiogram ordered.  If it cannot be done today patient has been advised to return to ER for urgent evaluation

## 2019-06-10 ENCOUNTER — Ambulatory Visit: Payer: Self-pay | Admitting: Family

## 2019-06-10 ENCOUNTER — Other Ambulatory Visit: Payer: Self-pay | Admitting: Internal Medicine

## 2019-06-10 MED ORDER — PREDNISONE 10 MG PO TABS: ORAL_TABLET | ORAL | 0 refills | Status: DC

## 2019-06-11 NOTE — Progress Notes (Signed)
anne  Patient ID: Ashley [name], female    DOB: 01-10-1951, 68 y.o.   MRN: [id number]  HPI  Ms Ashley [name] is a 68 y/o female with a history of CAD (CABG), hyperlipidemia, HTN, CKD, GERD, depression, COPD, PVD, current tobacco use and chronic heart failure.   Echo report from 05/27/2019 reviewed and showed an EF of 40-45% along with moderate/ severe MR> Echo report from 08/22/18 reviewed and showed an EF of 30% along with moderate MR. Echo report from 06/21/16 reviewed and showed an EF of 55-60% along with mild AR and moderate MR.   Admitted 05/27/2019 due to acute on chronic HF. Initially given IV lasix and then transitioned to oral diuretics. Levaquin given for salmonella. Cardiology consult obtained. Discharged after 4 days.   She presents today for a follow-up visit with a chief complaint of moderate shortness of breath upon minimal exertion. She describes this as chronic having been present for several years. She does feel like it's worsened recently as she currently has inflammation in her lungs and is being treated with prednisone. She has associated fatigue, cough, wheezing and light-headedness along with this. She denies any difficulty sleeping, abdominal distention, palpitations, pedal edema, chest pain or weight gain. Has been out of her entresto for the last week as she's lost her prescription coverage.   Past Medical History:  Diagnosis Date  . AICD (automatic cardioverter/defibrillator) present    on right side  . Anemia   . CAD (coronary artery disease)    s/p CABG  . CHF (congestive heart failure) ([place])   . Chronic kidney disease    renal artery stenosis  . Colon cancer ([place])   . Depression   . GERD (gastroesophageal reflux disease)   . Hx of colonic polyps   . Hyperlipidemia   . Hypertension   . Mitral valve disorder    s/p mitral valve repair wth CABG  . Myocardial infarction ([place])   . Peripheral vascular disease ([place])   . Presence of permanent cardiac pacemaker     Pacemaker/ Defibrillator   Past Surgical History:  Procedure Laterality Date  . arm surgery     fracture, has plates and screws  . CABG with mitral valve repair    . CARDIAC CATHETERIZATION    . COLON SURGERY     colon cancer  . COLONOSCOPY WITH PROPOFOL N/A 10/09/2016   Procedure: COLONOSCOPY WITH PROPOFOL;  Surgeon: Jonathon Bellows, MD;  Location: [facility] ENDOSCOPY;  Service: Endoscopy;  Laterality: N/A;  . COLONOSCOPY WITH PROPOFOL N/A 07/24/2018   Procedure: COLONOSCOPY WITH PROPOFOL;  Surgeon: Virgel Manifold, MD;  Location: [facility] ENDOSCOPY;  Service: Endoscopy;  Laterality: N/A;  . CORONARY ARTERY BYPASS GRAFT    . ENTEROSCOPY N/A 11/21/2018   Procedure: ENTEROSCOPY-BALLOON;  Surgeon: Jonathon Bellows, MD;  Location: [facility] ENDOSCOPY;  Service: Gastroenterology;  Laterality: N/A;  . ESOPHAGOGASTRODUODENOSCOPY (EGD) WITH PROPOFOL N/A 07/24/2018   Procedure: ESOPHAGOGASTRODUODENOSCOPY (EGD) WITH PROPOFOL;  Surgeon: Virgel Manifold, MD;  Location: [facility] ENDOSCOPY;  Service: Endoscopy;  Laterality: N/A;  . GIVENS CAPSULE STUDY N/A 08/16/2018   Procedure: GIVENS CAPSULE STUDY;  Surgeon: Virgel Manifold, MD;  Location: [facility] ENDOSCOPY;  Service: Endoscopy;  Laterality: N/A;  . pace maker defib  2016  . PERIPHERAL VASCULAR CATHETERIZATION N/A 10/23/2016   Procedure: Renal Angiography;  Surgeon: Algernon Huxley, MD;  Location: [place] [facility] LAB;  Service: Cardiovascular;  Laterality: N/A;  . TOTAL ABDOMINAL HYSTERECTOMY  1999   history of abnormal pap  Family History  Problem Relation Age of Onset  . Arthritis Mother   . Cancer Mother        uterus cancer  . Hyperlipidemia Mother   . Hypertension Mother   . Heart disease Mother   . Diabetes Mother   . Hyperlipidemia Father   . Hypertension Father   . Heart disease Father   . Diabetes Father   . Cancer Sister        ovary cancer  . Diabetes Maternal Grandmother   . Hypertension Maternal Grandmother   . Arthritis Maternal Grandmother    . Hypertension Maternal Grandfather   . Hypertension Paternal Grandmother   . Hypertension Paternal Grandfather   . Heart disease Paternal Grandfather   . Heart disease Brother   . Kidney disease Brother   . Breast cancer Neg Hx    Social History   Tobacco Use  . Smoking status: Current Every Day Smoker    Packs/day: 0.50    Types: Cigarettes    Last attempt to quit: 03/16/2019    Years since quitting: 0.2  . Smokeless tobacco: Never Used  . Tobacco comment: 1/2-1 ppd  Substance Use Topics  . Alcohol use: Yes    Alcohol/week: 2.0 standard drinks    Types: 2 Shots of liquor per week   Allergies  Allergen Reactions  . Hydrocodone-Acetaminophen Nausea Only   Prior to Admission medications   Medication Sig Start Date End Date Taking? Authorizing Provider  acetaminophen (TYLENOL) 500 MG tablet Maximum 6 tablets a day. Patient taking differently: Take 500 mg by mouth every 4 (four) hours as needed for fever. Maximum 6 tablets a day. 02/09/15  Yes Doss, Velora Heckler, RN  albuterol (VENTOLIN HFA) 108 (90 Base) MCG/ACT inhaler INHALE 2 PUFFS BY MOUTH INTO THE LUNGS EVERY 6 HOURS AS NEEDED FOR WHEEZING 06/03/19  Yes [name], MD  ALPRAZolam Duanne Moron) 0.5 MG tablet Take 0.5-1 tablets (0.25-0.5 mg total) by mouth at bedtime. TAKE ONE TABLET BY MOUTH AT BEDTIME AS NEEDED FOR ANXIETY 02/24/19  Yes [name], MD  aspirin 81 MG tablet Take 81 mg by mouth daily.   Yes [provider]  atorvastatin (LIPITOR) 40 MG tablet Take 1 tablet (40 mg total) by mouth daily. 06/03/19  Yes [name], MD  carvedilol (COREG) 6.25 MG tablet TAKE ONE TABLET BY MOUTH TWICE DAILY WITH MEAL 06/03/19  Yes [name], MD  clopidogrel (PLAVIX) 75 MG tablet Take 1 tablet (75 mg total) by mouth daily. 06/03/19  Yes [name], MD  cyanocobalamin (,VITAMIN B-12,) 1000 MCG/ML injection Inject 1 mL weekly into the muscle monthly 11/08/18  Yes [name], MD  diphenhydrAMINE (DIPHENHIST) 25 mg  capsule Take 25 mg by mouth at bedtime as needed.    Yes [provider]  escitalopram (LEXAPRO) 10 MG tablet Take 1 tablet (10 mg total) by mouth daily. 06/03/19  Yes [name], MD  ferrous sulfate 325 (65 FE) MG EC tablet Take 325 mg by mouth daily with breakfast.   Yes [provider]  fluticasone furoate-vilanterol (BREO ELLIPTA) 100-25 MCG/INH AEPB Inhale 1 puff into the lungs daily. 06/04/19 07/04/19 Yes [name], MD  Fluticasone-Salmeterol (ADVAIR DISKUS) 250-50 MCG/DOSE AEPB INHALE 1 PUFF BY MOUTH INTO THE LUNGS EVERY 12 HOURS RINSE MOUTH AFTER EACH USE 06/03/19  Yes [name], MD  furosemide (LASIX) 40 MG tablet Take 1 tablet (40 mg total) by mouth daily. 2 for weight gain >  2 lbs overnight 06/03/19  Yes [name], MD  ipratropium-albuterol (DUONEB) 0.5-2.5 (3) MG/3ML SOLN Take 3 mLs by nebulization every 6 (six) hours as needed. 08/05/18  Yes [name], MD  nitroGLYCERIN (NITROSTAT) 0.4 MG SL tablet Place 1 tablet (0.4 mg total) under the tongue every 5 (five) minutes as needed for chest pain. 06/19/16  Yes [name], MD  omeprazole (PRILOSEC) 40 MG capsule Take 1 capsule (40 mg total) by mouth daily. 06/03/19  Yes [name], MD  potassium chloride (K-DUR) 10 MEQ tablet Take 1 tablet (10 mEq total) by mouth daily. 06/03/19  Yes [name], MD  predniSONE (DELTASONE) 10 MG tablet 6 tablets on Day 1 , then reduce by 1 tablet daily until gone 06/10/19  Yes [name], MD  Tiotropium Bromide Monohydrate (SPIRIVA RESPIMAT) 2.5 MCG/ACT AERS Inhale 5 mcg into the lungs every morning. 06/03/19  Yes [name], MD  traZODone (DESYREL) 50 MG tablet Take 0.5-1 tablets (25-50 mg total) by mouth daily. One hour before bedtime 06/03/19  Yes [name], MD  sacubitril-valsartan (ENTRESTO) 24-26 MG Take 1 tablet by mouth 2 (two) times daily. Patient not taking: Reported on 06/12/2019 06/03/19   [name], MD    Review of Systems   Constitutional: Positive for fatigue (easily). Negative for appetite change.  HENT: Negative for congestion, postnasal drip and sore throat.   Eyes: Negative.   Respiratory: Positive for cough (productive of clear mucus), shortness of breath (with little exertion) and wheezing (at times). Negative for chest tightness.   Cardiovascular: Negative for chest pain, palpitations and leg swelling.  Gastrointestinal: Negative for abdominal distention and abdominal pain.  Endocrine: Negative.   Genitourinary: Negative.   Musculoskeletal: Negative for back pain and neck pain.  Skin: Negative.   Allergic/Immunologic: Negative.   Neurological: Positive for light-headedness (when changing positions too quickly). Negative for dizziness.  Hematological: Negative for adenopathy. Does not bruise/bleed easily.  Psychiatric/Behavioral: Negative for dysphoric mood and sleep disturbance (sleeping on 1 pillow). The patient is not nervous/anxious.    Vitals:   06/12/19 1010  BP: 136/88  Pulse: 87  Resp: 18  SpO2: 99%  Weight: 164 lb 4 oz (74.5 kg)  Height: 5\' 4"  (1.626 m)   Wt Readings from Last 3 Encounters:  06/12/19 164 lb 4 oz (74.5 kg)  05/31/19 163 lb 8 oz (74.2 kg)  05/07/19 168 lb (76.2 kg)   Lab Results  Component Value Date   CREATININE 1.14 (H) 05/31/2019   CREATININE 1.13 (H) 05/30/2019   CREATININE 1.23 (H) 05/29/2019     Physical Exam Vitals signs and nursing note reviewed.  Constitutional:      Appearance: She is well-developed.  HENT:     Head: Normocephalic and atraumatic.  Neck:     Musculoskeletal: Normal range of motion and neck supple.     Vascular: No JVD.  Cardiovascular:     Rate and Rhythm: Normal rate and regular rhythm.  Pulmonary:     Effort: Pulmonary effort is normal. No respiratory distress.     Breath sounds: No wheezing or rales.  Abdominal:     General: There is no distension.     Palpations: Abdomen is soft.  Musculoskeletal:     Right lower leg:  She exhibits no tenderness. No edema.     Left lower leg: She exhibits no tenderness. No edema.  Skin:    General: Skin is warm and dry.  Neurological:  Mental Status: She is alert and oriented to person, place, and time.  Psychiatric:        Behavior: Behavior normal.    Assessment & Plan:  1: Chronic heart failure with reduced ejection fraction- - NYHA class III - euvolemic today - weighing daily and she was reminded to call for an overnight weight gain of >2 pounds or a weekly weight gain of >5 pounds - weight down 7 pounds from last visit here 6 months ago - not adding salt and has been reading food labels. Reviewed the importance of closely following a 2000mg  sodium diet  - saw cardiology ([place]) 11/11/2018 - BNP 05/27/2019 was 4221.0 - 56 sample tablets given of entresto 24/26mg  to take 1 tablet twice daily. Medication management clinic has assisted with low income subsidy to help pay for her part D premium   2: HTN- - BP looks good today - saw PCP Derrel Nip) 06/09/2019 - BMP done 05/31/2019 reviewed and showed sodium 137, potassium 3.2, creatinine 1.14 and GFR 50  3: Tobacco use- - smoking 2 cigarettes daily - complete cessation discussed for 3 minutes with her.  Patient did not bring her medications nor a list. Each medication was verbally reviewed with the patient and she was encouraged to bring the bottles to every visit to confirm accuracy of list.  Return in 2 months or sooner for any questions/problems before then.

## 2019-06-12 ENCOUNTER — Ambulatory Visit: Payer: Medicare Other | Attending: Family | Admitting: Family

## 2019-06-12 ENCOUNTER — Encounter: Payer: Self-pay | Admitting: Family

## 2019-06-12 ENCOUNTER — Other Ambulatory Visit: Payer: Self-pay

## 2019-06-12 VITALS — BP 136/88 | HR 87 | Resp 18 | Ht 64.0 in | Wt 164.2 lb

## 2019-06-12 DIAGNOSIS — D649 Anemia, unspecified: Secondary | ICD-10-CM | POA: Diagnosis not present

## 2019-06-12 DIAGNOSIS — E785 Hyperlipidemia, unspecified: Secondary | ICD-10-CM | POA: Diagnosis not present

## 2019-06-12 DIAGNOSIS — I5022 Chronic systolic (congestive) heart failure: Secondary | ICD-10-CM

## 2019-06-12 DIAGNOSIS — Z79899 Other long term (current) drug therapy: Secondary | ICD-10-CM | POA: Diagnosis not present

## 2019-06-12 DIAGNOSIS — Z833 Family history of diabetes mellitus: Secondary | ICD-10-CM | POA: Diagnosis not present

## 2019-06-12 DIAGNOSIS — N189 Chronic kidney disease, unspecified: Secondary | ICD-10-CM | POA: Diagnosis not present

## 2019-06-12 DIAGNOSIS — Z85038 Personal history of other malignant neoplasm of large intestine: Secondary | ICD-10-CM | POA: Diagnosis not present

## 2019-06-12 DIAGNOSIS — I509 Heart failure, unspecified: Secondary | ICD-10-CM | POA: Diagnosis present

## 2019-06-12 DIAGNOSIS — Z7982 Long term (current) use of aspirin: Secondary | ICD-10-CM | POA: Diagnosis not present

## 2019-06-12 DIAGNOSIS — J449 Chronic obstructive pulmonary disease, unspecified: Secondary | ICD-10-CM | POA: Diagnosis not present

## 2019-06-12 DIAGNOSIS — Z885 Allergy status to narcotic agent status: Secondary | ICD-10-CM | POA: Insufficient documentation

## 2019-06-12 DIAGNOSIS — I252 Old myocardial infarction: Secondary | ICD-10-CM | POA: Insufficient documentation

## 2019-06-12 DIAGNOSIS — Z86010 Personal history of colonic polyps: Secondary | ICD-10-CM | POA: Diagnosis not present

## 2019-06-12 DIAGNOSIS — Z8249 Family history of ischemic heart disease and other diseases of the circulatory system: Secondary | ICD-10-CM | POA: Diagnosis not present

## 2019-06-12 DIAGNOSIS — Z720 Tobacco use: Secondary | ICD-10-CM

## 2019-06-12 DIAGNOSIS — K219 Gastro-esophageal reflux disease without esophagitis: Secondary | ICD-10-CM | POA: Diagnosis not present

## 2019-06-12 DIAGNOSIS — I739 Peripheral vascular disease, unspecified: Secondary | ICD-10-CM | POA: Insufficient documentation

## 2019-06-12 DIAGNOSIS — Z95810 Presence of automatic (implantable) cardiac defibrillator: Secondary | ICD-10-CM | POA: Insufficient documentation

## 2019-06-12 DIAGNOSIS — I130 Hypertensive heart and chronic kidney disease with heart failure and stage 1 through stage 4 chronic kidney disease, or unspecified chronic kidney disease: Secondary | ICD-10-CM | POA: Diagnosis not present

## 2019-06-12 DIAGNOSIS — Z951 Presence of aortocoronary bypass graft: Secondary | ICD-10-CM | POA: Insufficient documentation

## 2019-06-12 DIAGNOSIS — F17210 Nicotine dependence, cigarettes, uncomplicated: Secondary | ICD-10-CM | POA: Diagnosis not present

## 2019-06-12 DIAGNOSIS — Z7951 Long term (current) use of inhaled steroids: Secondary | ICD-10-CM | POA: Diagnosis not present

## 2019-06-12 DIAGNOSIS — F329 Major depressive disorder, single episode, unspecified: Secondary | ICD-10-CM | POA: Diagnosis not present

## 2019-06-12 DIAGNOSIS — I10 Essential (primary) hypertension: Secondary | ICD-10-CM

## 2019-06-12 DIAGNOSIS — Z7902 Long term (current) use of antithrombotics/antiplatelets: Secondary | ICD-10-CM | POA: Insufficient documentation

## 2019-06-12 DIAGNOSIS — I2510 Atherosclerotic heart disease of native coronary artery without angina pectoris: Secondary | ICD-10-CM | POA: Diagnosis not present

## 2019-06-12 NOTE — Patient Instructions (Signed)
Continue weighing daily and call for an overnight weight gain of > 2 pounds or a weekly weight gain of >5 pounds. 

## 2019-06-17 ENCOUNTER — Encounter: Payer: Self-pay | Admitting: *Deleted

## 2019-06-17 ENCOUNTER — Other Ambulatory Visit: Payer: Self-pay | Admitting: *Deleted

## 2019-06-17 DIAGNOSIS — I5022 Chronic systolic (congestive) heart failure: Secondary | ICD-10-CM | POA: Diagnosis not present

## 2019-06-17 NOTE — Patient Outreach (Signed)
[place] [facility]) [place] Telephone Outreach PCP office completes Transition of Care follow up post-hospital discharge Post-hospital discharge day # 17  06/17/2019  Ashley [name] 03-07-1951 [id number]  Successful telephone outreach to Ashley [name], 68 y/o female referred to [place] by [facility] Liaison RN CM after recent hospitalization July 21-25, 2020 for acute on chronic CHF exacerbation.Patient was discharged from hospital to home/ self-care without home health services in place.  Patient has history including, but not limited to, combined CHF; CAD with previous MI/ CABG, and ICD; HTN/ HLD; GERD; COPD; ongoing tobacco use.  HIPAA/ identity verified; pleasant 35 minute phone call.  Today, patient reports that she "is better after recent set-back," and she tells me about her recent CT scan completed urgently due to her reported symptoms around increased coughing and hemoptysis identified at time of recent PCP office visit on 06/09/2019- reports that CT scan "didn't show anything wrong."  Confirms that she obtained and has now completed prednisone taper and that her hemoptysis has "now resolved."  Patient denies clinical concerns, pain, and new/ recent falls today and sounds to be in no distress throughout phone call.  Patient further reports:  -- Has all medicationsand takes as prescribed;denies ongoing questions/ concerns around current medications; again confirms that she is working with [facility] Pharmacist [name] around stated need for financial assistance with medication.  States the only medicine that she does not have is the daily inhaler spiriva; states she has been using rescue inhaler between 1-4 times per day.  Encouraged patient to maintain contact with [name] and confirmed that she has [name]'s contact information.  Continues to self-manage medications. reports has not needed to take extra diuretic for weight gain at home since PCP  office visit 8/0/2020  -- has attended all recent scheduled provider appointments:  Continues to have reliable transportation through family members/ self; reviewed all recent and upcoming provider appointments with patient who has accurate understanding of all and verbalizes plans to attend all  Self-health management of chronic disease state of CHF : -- patient continues to verbalize a good understanding of her current state of health/ chronic conditions -- patient has continues established routine of daily weight monitoring/ recording at home, along with appropriate action plan/ weight gain guidelines in setting of CHF; reports weight ranges at home post-hospital discharge continue "in normal range" between "163-165 lbs;" with weight today of "164 lbs" -- had previously reported biggest health challenge as activity intolerance, but today, I specifically discussed with patient her ongoing use of tobacco; patient admits that she has continued to smoke, "but only 2-3 every day."  Confirms that she would "like to" completely quit, and identifies barriers to quitting as: family smokes inside the home, around her; long time "habit;" "hard to break habit;" "cravings throughout day." -- smoking cessation conversation completed and we discussed several specific strategies she might consider trying, including asking family members to take their smoking outside; I reminded her of her previously stated goal to stay off home O2, and discussed that IF she were to ever be on home O2, no one would be able to smoke inside/ near O2.  Patient stated that she would consider trying strategies but admits that she is not confident that she could be successful given that she has tried to quit so many times before.  Discussed that I would put information on smoking cessation in the mail to her and encouraged her to make small changes to her daily  habit to completely quit. -- discussed activity and patient stated it has been too  hot for her to get any activity-- again discussed strategies to get small dedicated times to increase activity without over-doing -- discussed signs/ symptoms MI/ CVA along with corresponding action plan for both; patient has good baseline knowledge around both  Patient denies further issues, concerns, or problems today. I confirmed that patient hasmy direct phone number, the main [facility] CM office phone number, and the [facility] CM 24-hour nurse advice phone number should issues arise prior to next scheduled [place] outreach in 2 weeks.  Encouraged patient to contact me directly if needs, questions, issues, or concerns arise prior to next scheduled outreach; patient agreed to do so.  Plan:  Patient will take medications as prescribed and will attend all scheduled provider appointments  Patient will promptly notify care providers for any new concerns/ issues/ problems that arise  Patient will continue monitoring/ recording daily weights   I will share today's notes/ care plan with patient's PCP as [facility] CM initial assessment  Will mail patient printed educational resources/ material around smoking cessation and patient will review for further discussion  [facility] CM outreach to continue with scheduled phone call in 2 weeks  [facility] CM Care Plan Problem One     Most Recent Value  Care Plan Problem One  High risk for hospital readmission related to/ as evidenced by recent hospitalization for CHF exacerbation July 21-25, 2020  Role Documenting the Problem One  Care Management [place] for Problem One  Active  [facility] Long Term Goal   Over the next 31 days, patient will not experience unplanned hospital readmission, as evidenced by patient reporting and review of EMR during [place] outreach  [place] Term Goal Start Date  06/04/19  Interventions for Problem One Long Term Goal  Discussed current clinical condition with patient and confirmed that she is not having  current clinical concerns,  discussed recent episode of hemoptysis and confirmed that patient obtained and completed prescribed short-term prednisone taper,  encouraged patient to promptly notify care providers for any new concerns/ issues/ problems that arise  [facility] CM Short Term Goal #1   Over the next 30 days, patient will continue to engage with [place] team around stated medication need for assistance, as evidenced by patient reporting and collaboration with [facility] Pharmacist as indicated during [place] outreach  [facility] CM Short Term Goal #1 Start Date  06/04/19  Interventions for Short Term Goal #1  Confirmed that patient continues working with [place] team and that she has their contact information- encouraged patient's ongoing engagement with [place] team in addressing her need for financial assiatnce with medications  [facility] CM Short Term Goal #2   Over the next 30 days, patient will attend all scheduled provider appointments as evidenced by patient reporting and review of EMR/ collaboration with care providers as indicated during [facility] RN CCM outreach  [facility] CM Short Term Goal #2 Start Date  06/04/19  Interventions for Short Term Goal #2  Reviewed with patient her recent and upcoming provider appointments and confirmed that she continues to have reliable transportation and plans to attend all    [facility] CM Care Plan Problem Two     Most Recent Value  Care Plan Problem Two  Need for ongoing reinforcement of self-health management strategies for chronic disease state of CHF, as evidenced by patient reporting and recent hospitalization for CHF exacerbation  Role Documenting the Problem Two  Care Management [place] for Problem Two  Active  Interventions for Problem Two Long Term Goal   Confirmed that patient continues to monitor and record daily weights at home,  confirmed that patient is able to verbalize signs/ symptoms yellow CHF zone along with corresponding action plan,   confirmed that patient is able to verbalize weight gain guidelines in setting of CHF and reviewed with patient her weights at home since our last conversation  [facility] Long Term Goal  Over the next 60 days, patient will continue to monitor and record daily weights at home, as evidenced by patient reporting and review of same during [place] CM outreach  [place] Term Goal Start Date  06/04/19  [facility] CM Short Term Goal #1   Over the next 30 days patient will review printed educational material around smoking cessation and will verbalize personal strategies to stop smoking, as evidenced by patient reporting of same during [facility] RN CM outreach  [facility] CM Short Term Goal #1 Start Date  06/17/19  Interventions for Short Term Goal #2   Discussed patient's challenges/ barriers aorund stopping smoking and confirmed that she does wish to completely quit smoking,  using teachback method, discussed several strategies for smoking cessation and encouraged patient to try,  placed printed educational material in mail to patient and encouraged her prompt review for further discussion     [name], RN, BSN, [employer] Coordinator [facility] Care Management  [phone]

## 2019-06-19 ENCOUNTER — Emergency Department: Payer: Medicare Other

## 2019-06-19 ENCOUNTER — Other Ambulatory Visit: Payer: Medicare Other

## 2019-06-19 ENCOUNTER — Telehealth: Payer: Self-pay

## 2019-06-19 ENCOUNTER — Encounter: Payer: Self-pay | Admitting: Emergency Medicine

## 2019-06-19 ENCOUNTER — Other Ambulatory Visit: Payer: Self-pay

## 2019-06-19 ENCOUNTER — Inpatient Hospital Stay
Admission: EM | Admit: 2019-06-19 | Discharge: 2019-06-21 | DRG: 291 | Disposition: A | Discharge status: Home / Self Care | Payer: Medicare Other | Attending: Internal Medicine | Admitting: Internal Medicine

## 2019-06-19 DIAGNOSIS — R0603 Acute respiratory distress: Secondary | ICD-10-CM

## 2019-06-19 DIAGNOSIS — E872 Acidosis: Secondary | ICD-10-CM | POA: Diagnosis present

## 2019-06-19 DIAGNOSIS — I509 Heart failure, unspecified: Secondary | ICD-10-CM | POA: Diagnosis not present

## 2019-06-19 DIAGNOSIS — Z23 Encounter for immunization: Secondary | ICD-10-CM

## 2019-06-19 DIAGNOSIS — F17210 Nicotine dependence, cigarettes, uncomplicated: Secondary | ICD-10-CM | POA: Diagnosis present

## 2019-06-19 DIAGNOSIS — Z8041 Family history of malignant neoplasm of ovary: Secondary | ICD-10-CM

## 2019-06-19 DIAGNOSIS — I130 Hypertensive heart and chronic kidney disease with heart failure and stage 1 through stage 4 chronic kidney disease, or unspecified chronic kidney disease: Principal | ICD-10-CM | POA: Diagnosis present

## 2019-06-19 DIAGNOSIS — Z90710 Acquired absence of both cervix and uterus: Secondary | ICD-10-CM

## 2019-06-19 DIAGNOSIS — J449 Chronic obstructive pulmonary disease, unspecified: Secondary | ICD-10-CM | POA: Diagnosis present

## 2019-06-19 DIAGNOSIS — Z7902 Long term (current) use of antithrombotics/antiplatelets: Secondary | ICD-10-CM

## 2019-06-19 DIAGNOSIS — R069 Unspecified abnormalities of breathing: Secondary | ICD-10-CM | POA: Diagnosis not present

## 2019-06-19 DIAGNOSIS — N179 Acute kidney failure, unspecified: Secondary | ICD-10-CM | POA: Diagnosis present

## 2019-06-19 DIAGNOSIS — Z20828 Contact with and (suspected) exposure to other viral communicable diseases: Secondary | ICD-10-CM | POA: Diagnosis present

## 2019-06-19 DIAGNOSIS — J441 Chronic obstructive pulmonary disease with (acute) exacerbation: Secondary | ICD-10-CM | POA: Diagnosis not present

## 2019-06-19 DIAGNOSIS — Z95810 Presence of automatic (implantable) cardiac defibrillator: Secondary | ICD-10-CM

## 2019-06-19 DIAGNOSIS — N183 Chronic kidney disease, stage 3 (moderate): Secondary | ICD-10-CM | POA: Diagnosis present

## 2019-06-19 DIAGNOSIS — I252 Old myocardial infarction: Secondary | ICD-10-CM | POA: Diagnosis not present

## 2019-06-19 DIAGNOSIS — I248 Other forms of acute ischemic heart disease: Secondary | ICD-10-CM | POA: Diagnosis present

## 2019-06-19 DIAGNOSIS — K219 Gastro-esophageal reflux disease without esophagitis: Secondary | ICD-10-CM | POA: Diagnosis present

## 2019-06-19 DIAGNOSIS — F329 Major depressive disorder, single episode, unspecified: Secondary | ICD-10-CM | POA: Diagnosis present

## 2019-06-19 DIAGNOSIS — Z8049 Family history of malignant neoplasm of other genital organs: Secondary | ICD-10-CM

## 2019-06-19 DIAGNOSIS — R918 Other nonspecific abnormal finding of lung field: Secondary | ICD-10-CM | POA: Diagnosis not present

## 2019-06-19 DIAGNOSIS — Z85038 Personal history of other malignant neoplasm of large intestine: Secondary | ICD-10-CM

## 2019-06-19 DIAGNOSIS — Z951 Presence of aortocoronary bypass graft: Secondary | ICD-10-CM

## 2019-06-19 DIAGNOSIS — Z8349 Family history of other endocrine, nutritional and metabolic diseases: Secondary | ICD-10-CM

## 2019-06-19 DIAGNOSIS — E785 Hyperlipidemia, unspecified: Secondary | ICD-10-CM | POA: Diagnosis present

## 2019-06-19 DIAGNOSIS — E876 Hypokalemia: Secondary | ICD-10-CM | POA: Diagnosis not present

## 2019-06-19 DIAGNOSIS — J810 Acute pulmonary edema: Secondary | ICD-10-CM | POA: Diagnosis not present

## 2019-06-19 DIAGNOSIS — I739 Peripheral vascular disease, unspecified: Secondary | ICD-10-CM | POA: Diagnosis present

## 2019-06-19 DIAGNOSIS — R0689 Other abnormalities of breathing: Secondary | ICD-10-CM | POA: Diagnosis not present

## 2019-06-19 DIAGNOSIS — I2510 Atherosclerotic heart disease of native coronary artery without angina pectoris: Secondary | ICD-10-CM | POA: Diagnosis present

## 2019-06-19 DIAGNOSIS — F17200 Nicotine dependence, unspecified, uncomplicated: Secondary | ICD-10-CM | POA: Diagnosis not present

## 2019-06-19 DIAGNOSIS — Z79899 Other long term (current) drug therapy: Secondary | ICD-10-CM

## 2019-06-19 DIAGNOSIS — J80 Acute respiratory distress syndrome: Secondary | ICD-10-CM | POA: Diagnosis not present

## 2019-06-19 DIAGNOSIS — T380X5A Adverse effect of glucocorticoids and synthetic analogues, initial encounter: Secondary | ICD-10-CM | POA: Diagnosis not present

## 2019-06-19 DIAGNOSIS — Z7982 Long term (current) use of aspirin: Secondary | ICD-10-CM | POA: Diagnosis not present

## 2019-06-19 DIAGNOSIS — R739 Hyperglycemia, unspecified: Secondary | ICD-10-CM | POA: Diagnosis not present

## 2019-06-19 DIAGNOSIS — R0602 Shortness of breath: Secondary | ICD-10-CM | POA: Diagnosis not present

## 2019-06-19 DIAGNOSIS — J9601 Acute respiratory failure with hypoxia: Secondary | ICD-10-CM | POA: Diagnosis present

## 2019-06-19 DIAGNOSIS — Z7951 Long term (current) use of inhaled steroids: Secondary | ICD-10-CM

## 2019-06-19 DIAGNOSIS — J9600 Acute respiratory failure, unspecified whether with hypoxia or hypercapnia: Secondary | ICD-10-CM | POA: Diagnosis not present

## 2019-06-19 DIAGNOSIS — Z841 Family history of disorders of kidney and ureter: Secondary | ICD-10-CM

## 2019-06-19 DIAGNOSIS — Z8249 Family history of ischemic heart disease and other diseases of the circulatory system: Secondary | ICD-10-CM

## 2019-06-19 DIAGNOSIS — I701 Atherosclerosis of renal artery: Secondary | ICD-10-CM | POA: Diagnosis present

## 2019-06-19 DIAGNOSIS — I5023 Acute on chronic systolic (congestive) heart failure: Secondary | ICD-10-CM | POA: Diagnosis present

## 2019-06-19 DIAGNOSIS — I110 Hypertensive heart disease with heart failure: Secondary | ICD-10-CM | POA: Diagnosis not present

## 2019-06-19 DIAGNOSIS — I255 Ischemic cardiomyopathy: Secondary | ICD-10-CM | POA: Diagnosis present

## 2019-06-19 DIAGNOSIS — Z8719 Personal history of other diseases of the digestive system: Secondary | ICD-10-CM

## 2019-06-19 DIAGNOSIS — J811 Chronic pulmonary edema: Secondary | ICD-10-CM

## 2019-06-19 DIAGNOSIS — Z885 Allergy status to narcotic agent status: Secondary | ICD-10-CM

## 2019-06-19 DIAGNOSIS — I10 Essential (primary) hypertension: Secondary | ICD-10-CM | POA: Diagnosis not present

## 2019-06-19 DIAGNOSIS — R231 Pallor: Secondary | ICD-10-CM | POA: Diagnosis not present

## 2019-06-19 LAB — COMPREHENSIVE METABOLIC PANEL
ALT: 17 U/L (ref 0–44)
AST: 33 U/L (ref 15–41)
Albumin: 3.9 g/dL (ref 3.5–5.0)
Alkaline Phosphatase: 86 U/L (ref 38–126)
Anion gap: 23 — ABNORMAL HIGH (ref 5–15)
BUN: 15 mg/dL (ref 8–23)
CO2: 18 mmol/L — ABNORMAL LOW (ref 22–32)
Calcium: 8.6 mg/dL — ABNORMAL LOW (ref 8.9–10.3)
Chloride: 96 mmol/L — ABNORMAL LOW (ref 98–111)
Creatinine, Ser: 1.28 mg/dL — ABNORMAL HIGH (ref 0.44–1.00)
GFR calc Af Amer: 50 mL/min — ABNORMAL LOW (ref >60)
GFR calc non Af Amer: 44 mL/min — ABNORMAL LOW (ref >60)
Glucose, Bld: 325 mg/dL — ABNORMAL HIGH (ref 70–99)
Potassium: 3.9 mmol/L (ref 3.5–5.1)
Sodium: 137 mmol/L (ref 135–145)
Total Bilirubin: 1.2 mg/dL (ref 0.3–1.2)
Total Protein: 7.2 g/dL (ref 6.5–8.1)

## 2019-06-19 LAB — BLOOD GAS, VENOUS
Acid-Base Excess: 3.2 mmol/L — ABNORMAL HIGH (ref 0.0–2.0)
Bicarbonate: 27.9 mmol/L (ref 20.0–28.0)
Delivery systems: POSITIVE
FIO2: 40
O2 Saturation: 95.8 %
Patient temperature: 37
pCO2, Ven: 42 mmHg — ABNORMAL LOW (ref 44.0–60.0)
pH, Ven: 7.43 (ref 7.250–7.430)
pO2, Ven: 78 mmHg — ABNORMAL HIGH (ref 32.0–45.0)

## 2019-06-19 LAB — CBC WITH DIFFERENTIAL/PLATELET
Abs Immature Granulocytes: 0.36 10*3/uL — ABNORMAL HIGH (ref 0.00–0.07)
Basophils Absolute: 0.2 10*3/uL — ABNORMAL HIGH (ref 0.0–0.1)
Basophils Relative: 1 %
Eosinophils Absolute: 0.4 10*3/uL (ref 0.0–0.5)
Eosinophils Relative: 2 %
HCT: 40.7 % (ref 36.0–46.0)
Hemoglobin: 13.5 g/dL (ref 12.0–15.0)
Immature Granulocytes: 2 %
Lymphocytes Relative: 33 %
Lymphs Abs: 6.1 10*3/uL — ABNORMAL HIGH (ref 0.7–4.0)
MCH: 35.1 pg — ABNORMAL HIGH (ref 26.0–34.0)
MCHC: 33.2 g/dL (ref 30.0–36.0)
MCV: 105.7 fL — ABNORMAL HIGH (ref 80.0–100.0)
Monocytes Absolute: 1.6 10*3/uL — ABNORMAL HIGH (ref 0.1–1.0)
Monocytes Relative: 9 %
Neutro Abs: 9.7 10*3/uL — ABNORMAL HIGH (ref 1.7–7.7)
Neutrophils Relative %: 53 %
Platelets: 416 10*3/uL — ABNORMAL HIGH (ref 150–400)
RBC: 3.85 MIL/uL — ABNORMAL LOW (ref 3.87–5.11)
RDW: 15.2 % (ref 11.5–15.5)
Smear Review: NORMAL
WBC Morphology: ABNORMAL
WBC: 18.3 10*3/uL — ABNORMAL HIGH (ref 4.0–10.5)
nRBC: 0.2 % (ref 0.0–0.2)

## 2019-06-19 LAB — LACTIC ACID, PLASMA: Lactic Acid, Venous: 5 mmol/L (ref 0.5–1.9)

## 2019-06-19 LAB — GLUCOSE, CAPILLARY
Glucose-Capillary: 218 mg/dL — ABNORMAL HIGH (ref 70–99)
Glucose-Capillary: 265 mg/dL — ABNORMAL HIGH (ref 70–99)
Glucose-Capillary: 222 mg/dL — ABNORMAL HIGH (ref 70–99)

## 2019-06-19 LAB — SARS CORONAVIRUS 2 BY RT PCR (HOSPITAL ORDER, PERFORMED IN ~~LOC~~ HOSPITAL LAB): SARS Coronavirus 2: NEGATIVE

## 2019-06-19 LAB — BRAIN NATRIURETIC PEPTIDE: B Natriuretic Peptide: 3319 pg/mL — ABNORMAL HIGH (ref 0.0–100.0)

## 2019-06-19 LAB — TROPONIN I (HIGH SENSITIVITY)
Troponin I (High Sensitivity): 29 ng/L — ABNORMAL HIGH (ref <18)
Troponin I (High Sensitivity): 23 ng/L — ABNORMAL HIGH (ref <18)

## 2019-06-19 LAB — PROCALCITONIN: Procalcitonin: <0.1 ng/mL

## 2019-06-19 MED ORDER — IPRATROPIUM-ALBUTEROL 0.5-2.5 (3) MG/3ML IN SOLN: 3.0000 mL | Freq: Once | RESPIRATORY_TRACT | Status: DC

## 2019-06-19 MED ORDER — ONDANSETRON HCL 4 MG/2ML IJ SOLN: 4.0000 mg | Freq: Four times a day (QID) | INTRAMUSCULAR | Status: DC | PRN

## 2019-06-19 MED ORDER — CARVEDILOL 6.25 MG PO TABS
6.2500 mg | ORAL_TABLET | Freq: Two times a day (BID) | ORAL | Status: DC
  Administered 2019-06-19 – 2019-06-21 (×4): 6.25 mg via ORAL
  Filled 2019-06-19 (×4): qty 1

## 2019-06-19 MED ORDER — BUDESONIDE 0.5 MG/2ML IN SUSP
0.5000 mg | Freq: Two times a day (BID) | RESPIRATORY_TRACT | Status: DC
  Administered 2019-06-19 – 2019-06-20 (×2): 0.5 mg via RESPIRATORY_TRACT
  Filled 2019-06-19 (×2): qty 2

## 2019-06-19 MED ORDER — FUROSEMIDE 10 MG/ML IJ SOLN
40.0000 mg | Freq: Two times a day (BID) | INTRAMUSCULAR | Status: AC
  Administered 2019-06-19 – 2019-06-20 (×2): 40 mg via INTRAVENOUS
  Filled 2019-06-19 (×2): qty 4

## 2019-06-19 MED ORDER — VANCOMYCIN HCL IN DEXTROSE 1-5 GM/200ML-% IV SOLN
1000.0000 mg | Freq: Once | INTRAVENOUS | Status: AC
  Administered 2019-06-19: 1000 mg via INTRAVENOUS
  Filled 2019-06-19: qty 200

## 2019-06-19 MED ORDER — TRAZODONE HCL 50 MG PO TABS: 25.0000 mg | ORAL_TABLET | Freq: Every day | ORAL | Status: DC

## 2019-06-19 MED ORDER — LORAZEPAM 2 MG/ML IJ SOLN
0.5000 mg | Freq: Once | INTRAMUSCULAR | Status: AC
  Administered 2019-06-19: 0.5 mg via INTRAVENOUS

## 2019-06-19 MED ORDER — CLOPIDOGREL BISULFATE 75 MG PO TABS
75.0000 mg | ORAL_TABLET | Freq: Every day | ORAL | Status: DC
  Administered 2019-06-20 – 2019-06-21 (×2): 75 mg via ORAL
  Filled 2019-06-19 (×2): qty 1

## 2019-06-19 MED ORDER — ACETAMINOPHEN 650 MG RE SUPP: 650.0000 mg | Freq: Four times a day (QID) | RECTAL | Status: DC | PRN

## 2019-06-19 MED ORDER — SACUBITRIL-VALSARTAN 24-26 MG PO TABS
1.0000 | ORAL_TABLET | Freq: Two times a day (BID) | ORAL | Status: DC
  Administered 2019-06-19 – 2019-06-21 (×4): 1 via ORAL
  Filled 2019-06-19 (×6): qty 1

## 2019-06-19 MED ORDER — FUROSEMIDE 10 MG/ML IJ SOLN: 40.0000 mg | Freq: Two times a day (BID) | INTRAMUSCULAR | Status: DC

## 2019-06-19 MED ORDER — FUROSEMIDE 10 MG/ML IJ SOLN
40.0000 mg | Freq: Once | INTRAMUSCULAR | Status: AC
  Administered 2019-06-19: 40 mg via INTRAVENOUS
  Filled 2019-06-19: qty 4

## 2019-06-19 MED ORDER — SODIUM CHLORIDE 0.9 % IV SOLN
2.0000 g | Freq: Once | INTRAVENOUS | Status: AC
  Administered 2019-06-19: 2 g via INTRAVENOUS
  Filled 2019-06-19: qty 2

## 2019-06-19 MED ORDER — IPRATROPIUM-ALBUTEROL 0.5-2.5 (3) MG/3ML IN SOLN
3.0000 mL | Freq: Once | RESPIRATORY_TRACT | Status: AC
  Administered 2019-06-19: 3 mL via RESPIRATORY_TRACT

## 2019-06-19 MED ORDER — INSULIN ASPART 100 UNIT/ML ~~LOC~~ SOLN
0.0000 [IU] | Freq: Three times a day (TID) | SUBCUTANEOUS | Status: DC
  Administered 2019-06-19: 5 [IU] via SUBCUTANEOUS

## 2019-06-19 MED ORDER — IPRATROPIUM-ALBUTEROL 0.5-2.5 (3) MG/3ML IN SOLN
3.0000 mL | Freq: Four times a day (QID) | RESPIRATORY_TRACT | Status: DC
  Administered 2019-06-19 – 2019-06-20 (×3): 3 mL via RESPIRATORY_TRACT
  Filled 2019-06-19 (×3): qty 3

## 2019-06-19 MED ORDER — INSULIN ASPART 100 UNIT/ML ~~LOC~~ SOLN
SUBCUTANEOUS | Status: AC
  Administered 2019-06-19: 14:00:00 5 [IU] via SUBCUTANEOUS
  Filled 2019-06-19: qty 1

## 2019-06-19 MED ORDER — METHYLPREDNISOLONE SODIUM SUCC 40 MG IJ SOLR: 40.0000 mg | Freq: Two times a day (BID) | INTRAMUSCULAR | Status: DC

## 2019-06-19 MED ORDER — INSULIN ASPART 100 UNIT/ML ~~LOC~~ SOLN
0.0000 [IU] | Freq: Every day | SUBCUTANEOUS | Status: DC
  Administered 2019-06-19: 2 [IU] via SUBCUTANEOUS
  Filled 2019-06-19: qty 1

## 2019-06-19 MED ORDER — LORAZEPAM 2 MG/ML IJ SOLN
INTRAMUSCULAR | Status: AC
  Administered 2019-06-19: 0.5 mg via INTRAVENOUS
  Filled 2019-06-19: qty 1

## 2019-06-19 MED ORDER — VANCOMYCIN HCL 500 MG IV SOLR
500.0000 mg | Freq: Once | INTRAVENOUS | Status: AC
  Administered 2019-06-19: 500 mg via INTRAVENOUS
  Filled 2019-06-19: qty 500

## 2019-06-19 MED ORDER — LABETALOL HCL 5 MG/ML IV SOLN
5.0000 mg | Freq: Once | INTRAVENOUS | Status: AC
  Administered 2019-06-19: 11:00:00 5 mg via INTRAVENOUS
  Filled 2019-06-19 (×2): qty 4

## 2019-06-19 MED ORDER — FERROUS SULFATE 325 (65 FE) MG PO TABS
325.0000 mg | ORAL_TABLET | Freq: Every day | ORAL | Status: DC
  Administered 2019-06-20 – 2019-06-21 (×2): 325 mg via ORAL
  Filled 2019-06-19 (×2): qty 1

## 2019-06-19 MED ORDER — TRAZODONE HCL 50 MG PO TABS
25.0000 mg | ORAL_TABLET | Freq: Every day | ORAL | Status: DC
  Filled 2019-06-19: qty 1

## 2019-06-19 MED ORDER — ORAL CARE MOUTH RINSE
15.0000 mL | Freq: Two times a day (BID) | OROMUCOSAL | Status: DC
  Administered 2019-06-20 (×2): 15 mL via OROMUCOSAL

## 2019-06-19 MED ORDER — ACETAMINOPHEN 325 MG PO TABS: 650.0000 mg | ORAL_TABLET | Freq: Four times a day (QID) | ORAL | Status: DC | PRN

## 2019-06-19 MED ORDER — IPRATROPIUM-ALBUTEROL 0.5-2.5 (3) MG/3ML IN SOLN
RESPIRATORY_TRACT | Status: AC
  Administered 2019-06-19: 3 mL via RESPIRATORY_TRACT
  Filled 2019-06-19: qty 15

## 2019-06-19 MED ORDER — DIPHENHYDRAMINE HCL 25 MG PO CAPS
25.0000 mg | ORAL_CAPSULE | Freq: Every evening | ORAL | Status: DC | PRN
  Filled 2019-06-19: qty 1

## 2019-06-19 MED ORDER — INSULIN ASPART 100 UNIT/ML ~~LOC~~ SOLN
0.0000 [IU] | Freq: Three times a day (TID) | SUBCUTANEOUS | Status: DC
  Administered 2019-06-19: 8 [IU] via SUBCUTANEOUS
  Administered 2019-06-20: 2 [IU] via SUBCUTANEOUS
  Administered 2019-06-20 (×2): 3 [IU] via SUBCUTANEOUS
  Filled 2019-06-19 (×4): qty 1

## 2019-06-19 MED ORDER — METHYLPREDNISOLONE SODIUM SUCC 125 MG IJ SOLR
125.0000 mg | Freq: Once | INTRAMUSCULAR | Status: AC
  Administered 2019-06-19: 09:00:00 125 mg via INTRAVENOUS
  Filled 2019-06-19: qty 2

## 2019-06-19 MED ORDER — ATORVASTATIN CALCIUM 20 MG PO TABS
40.0000 mg | ORAL_TABLET | Freq: Every day | ORAL | Status: DC
  Administered 2019-06-20: 40 mg via ORAL
  Administered 2019-06-21: 20 mg via ORAL
  Filled 2019-06-19 (×2): qty 2

## 2019-06-19 MED ORDER — PANTOPRAZOLE SODIUM 40 MG PO TBEC
40.0000 mg | DELAYED_RELEASE_TABLET | Freq: Every day | ORAL | Status: DC
  Administered 2019-06-20 – 2019-06-21 (×2): 40 mg via ORAL
  Filled 2019-06-19 (×2): qty 1

## 2019-06-19 MED ORDER — ASPIRIN EC 81 MG PO TBEC
81.0000 mg | DELAYED_RELEASE_TABLET | Freq: Every day | ORAL | Status: DC
  Administered 2019-06-20 – 2019-06-21 (×2): 81 mg via ORAL
  Filled 2019-06-19 (×2): qty 1

## 2019-06-19 MED ORDER — TRAZODONE HCL 50 MG PO TABS
50.0000 mg | ORAL_TABLET | Freq: Every evening | ORAL | Status: DC | PRN
  Administered 2019-06-19 – 2019-06-20 (×2): 50 mg via ORAL
  Filled 2019-06-19: qty 1

## 2019-06-19 MED ORDER — IPRATROPIUM-ALBUTEROL 0.5-2.5 (3) MG/3ML IN SOLN
3.0000 mL | Freq: Once | RESPIRATORY_TRACT | Status: AC
  Administered 2019-06-19: 08:00:00 3 mL via RESPIRATORY_TRACT

## 2019-06-19 MED ORDER — ALPRAZOLAM 0.5 MG PO TABS
0.2500 mg | ORAL_TABLET | Freq: Every day | ORAL | Status: DC
  Administered 2019-06-19 – 2019-06-20 (×2): 0.5 mg via ORAL
  Filled 2019-06-19 (×2): qty 1

## 2019-06-19 MED ORDER — ONDANSETRON HCL 4 MG PO TABS: 4.0000 mg | ORAL_TABLET | Freq: Four times a day (QID) | ORAL | Status: DC | PRN

## 2019-06-19 MED ORDER — POTASSIUM CHLORIDE CRYS ER 20 MEQ PO TBCR
40.0000 meq | EXTENDED_RELEASE_TABLET | Freq: Once | ORAL | Status: AC
  Administered 2019-06-19: 40 meq via ORAL
  Filled 2019-06-19: qty 2

## 2019-06-19 MED ORDER — ESCITALOPRAM OXALATE 10 MG PO TABS
10.0000 mg | ORAL_TABLET | Freq: Every day | ORAL | Status: DC
  Administered 2019-06-20 – 2019-06-21 (×2): 10 mg via ORAL
  Filled 2019-06-19 (×3): qty 1

## 2019-06-19 MED ORDER — ENOXAPARIN SODIUM 40 MG/0.4ML ~~LOC~~ SOLN
40.0000 mg | SUBCUTANEOUS | Status: DC
  Administered 2019-06-19 – 2019-06-20 (×2): 40 mg via SUBCUTANEOUS
  Filled 2019-06-19 (×2): qty 0.4

## 2019-06-19 MED ORDER — METHYLPREDNISOLONE SODIUM SUCC 125 MG IJ SOLR: 125.0000 mg | Freq: Once | INTRAMUSCULAR | Status: DC

## 2019-06-19 MED ORDER — INSULIN ASPART 100 UNIT/ML ~~LOC~~ SOLN: 0.0000 [IU] | Freq: Every day | SUBCUTANEOUS | Status: DC

## 2019-06-19 MED ORDER — POTASSIUM CHLORIDE CRYS ER 20 MEQ PO TBCR
10.0000 meq | EXTENDED_RELEASE_TABLET | Freq: Every day | ORAL | Status: DC
  Administered 2019-06-20 – 2019-06-21 (×2): 10 meq via ORAL
  Filled 2019-06-19 (×4): qty 1

## 2019-06-19 NOTE — ED Notes (Signed)
Patient continues to rest in bed with eyes closed. Even, unlabored respirations noted.

## 2019-06-19 NOTE — Telephone Encounter (Signed)
Copied from [place] [phone]. Topic: Quick Communication - Appointment Cancellation >> Jun 19, 2019  8:46 AM Ashley [name], Helene Kelp D wrote: Patient called to cancel appointment scheduled for 06/19/19 due to she is in the ER. Patient HAS NOT rescheduled their appointment.  Route to department's PEC pool.

## 2019-06-19 NOTE — ED Triage Notes (Signed)
Patient to ED via EMS in respiratory distress. Patient reports worsening SOB x 3-4 days. States she was trying to get to the doctor this morning but had to stop on the side of the road. Patient has history or COPD and CHF. MD and RT at bedside.

## 2019-06-19 NOTE — ED Provider Notes (Signed)
[facility] Emergency Department Provider Note    None    (approximate)  I have reviewed the triage vital signs and the nursing notes.   HISTORY  Chief Complaint Respiratory Distress  Level V Caveat:  Respiratory distress  HPI Ashley [name] is a 68 y.o. female extensive [name] medical history presents the ER in respiratory distress.  States that she been having some shortness of breath over the [name] 2 days but while driving to the doctor's office today had to pull over due to severe onset shortness of breath.  Patient with hypoxic tripoding with moderate respiratory distress requiring nonrebreather mask.  Patient was brought to the ER.  Denies any pain but is having trouble speaking in more than 1 or 2 words.  Denies any fevers.    [name] Medical History:  Diagnosis Date  . AICD (automatic cardioverter/defibrillator) present    on right side  . Anemia   . CAD (coronary artery disease)    s/p CABG  . CHF (congestive heart failure) ([place])   . Chronic kidney disease    renal artery stenosis  . Colon cancer (Ashley [name])   . Depression   . GERD (gastroesophageal reflux disease)   . Hx of colonic polyps   . Hyperlipidemia   . Hypertension   . Mitral valve disorder    s/p mitral valve repair wth CABG  . Myocardial infarction (Ashley [name])   . Peripheral vascular disease (Ashley [name])   . Presence of permanent cardiac pacemaker    Pacemaker/ Defibrillator   Family History  Problem Relation Age of Onset  . Arthritis Mother   . Cancer Mother        uterus cancer  . Hyperlipidemia Mother   . Hypertension Mother   . Heart disease Mother   . Diabetes Mother   . Hyperlipidemia Father   . Hypertension Father   . Heart disease Father   . Diabetes Father   . Cancer Sister        ovary cancer  . Diabetes Maternal Grandmother   . Hypertension Maternal Grandmother   . Arthritis Maternal Grandmother   . Hypertension Maternal Grandfather   . Hypertension Paternal Grandmother    . Hypertension Paternal Grandfather   . Heart disease Paternal Grandfather   . Heart disease Brother   . Kidney disease Brother   . Breast cancer Neg Hx    [name] Surgical History:  Procedure Laterality Date  . arm surgery     fracture, has plates and screws  . CABG with mitral valve repair    . CARDIAC CATHETERIZATION    . COLON SURGERY     colon cancer  . COLONOSCOPY WITH PROPOFOL N/A 10/09/2016   Procedure: COLONOSCOPY WITH PROPOFOL;  Surgeon: Jonathon Bellows, MD;  Location: [facility] ENDOSCOPY;  Service: Endoscopy;  Laterality: N/A;  . COLONOSCOPY WITH PROPOFOL N/A 07/24/2018   Procedure: COLONOSCOPY WITH PROPOFOL;  Surgeon: Virgel Manifold, MD;  Location: [facility] ENDOSCOPY;  Service: Endoscopy;  Laterality: N/A;  . CORONARY ARTERY BYPASS GRAFT    . ENTEROSCOPY N/A 11/21/2018   Procedure: ENTEROSCOPY-BALLOON;  Surgeon: Jonathon Bellows, MD;  Location: [facility] ENDOSCOPY;  Service: Gastroenterology;  Laterality: N/A;  . ESOPHAGOGASTRODUODENOSCOPY (EGD) WITH PROPOFOL N/A 07/24/2018   Procedure: ESOPHAGOGASTRODUODENOSCOPY (EGD) WITH PROPOFOL;  Surgeon: Virgel Manifold, MD;  Location: [facility] ENDOSCOPY;  Service: Endoscopy;  Laterality: N/A;  . GIVENS CAPSULE STUDY N/A 08/16/2018   Procedure: GIVENS CAPSULE STUDY;  Surgeon: Virgel Manifold, MD;  Location: [facility] ENDOSCOPY;  Service: Endoscopy;  Laterality: N/A;  . pace maker defib  2016  . PERIPHERAL VASCULAR CATHETERIZATION N/A 10/23/2016   Procedure: Renal Angiography;  Surgeon: Algernon Huxley, MD;  Location: [name];  Service: Cardiovascular;  Laterality: N/A;  . TOTAL ABDOMINAL HYSTERECTOMY  1999   history of abnormal pap   Patient Active Problem List   Diagnosis Date Noted  . Cough with hemoptysis 06/09/2019  . Acute on chronic combined systolic and diastolic CHF (congestive heart failure) (Ashley [name]) 05/27/2019  . Insomnia 05/07/2019  . Hypokalemia 03/23/2019  . Hyperglycemia, drug-induced 03/23/2019  . Covid-19 Virus not Detected  03/23/2019  . Respiratory failure ([place]) 03/16/2019  . AVM (arteriovenous malformation) of small bowel, acquired   . Anemia, iron deficiency 11/10/2018  . Chronic systolic heart failure (Ashley [name]) 11/06/2018  . Rectal polyp   . Benign neoplasm of cecum   . Barrett's esophagus without dysplasia   . Stomach irritation   . Chronic diastolic heart failure (Ashley [name]) 07/03/2018  . HTN (hypertension) 07/03/2018  . COPD with emphysema (Ashley [name]) 06/28/2018  . B12 deficiency anemia 06/28/2018  . Hypotension 05/11/2018  . Prediabetes 05/11/2018  . Personal history of colon cancer   . Benign neoplasm of descending colon   . Polyp of sigmoid colon   . Benign neoplasm of transverse colon   . Diverticulosis of large intestine without diverticulitis   . 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 Renal artery stenosis (Ashley [name]) 10/03/2016  . 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 Hospital discharge follow-up 02/09/2015  . Cough in adult 10/21/2014  . Tobacco abuse 10/11/2014  . Tobacco abuse counseling 10/11/2014  . Chronic right hip pain 08/26/2014  . Atherosclerosis of native artery of extremity with intermittent claudication (Ashley [name]) 06/18/2013  . Preoperative evaluation to rule out surgical contraindication 06/18/2013  . CAD (coronary artery disease) 06/01/2013  . GERD (gastroesophageal reflux disease) 06/01/2013  . Hypercholesterolemia 06/01/2013  . Tubular adenoma of colon 06/01/2013  . Major depressive disorder in remission (Ashley [name]) 06/01/2013      Prior to Admission medications   Medication Sig Start Date End Date Taking? Authorizing Provider  acetaminophen (TYLENOL) 500 MG tablet Maximum 6 tablets a day. Patient taking differently: Take 500 mg by mouth every 4 (four) hours as needed for fever. Maximum 6 tablets a day. 02/09/15   Rubbie Battiest, RN  albuterol (VENTOLIN HFA) 108 (90 Base) MCG/ACT inhaler INHALE 2 PUFFS BY MOUTH INTO THE LUNGS EVERY 6 HOURS AS NEEDED FOR  WHEEZING 06/03/19   [name], MD  ALPRAZolam Duanne Moron) 0.5 MG tablet Take 0.5-1 tablets (0.25-0.5 mg total) by mouth at bedtime. TAKE ONE TABLET BY MOUTH AT BEDTIME AS NEEDED FOR ANXIETY 02/24/19   [name], MD  aspirin 81 MG tablet Take 81 mg by mouth daily.    [provider]  atorvastatin (LIPITOR) 40 MG tablet Take 1 tablet (40 mg total) by mouth daily. 06/03/19   [name], MD  carvedilol (COREG) 6.25 MG tablet TAKE ONE TABLET BY MOUTH TWICE DAILY WITH MEAL 06/03/19   [name], MD  clopidogrel (PLAVIX) 75 MG tablet Take 1 tablet (75 mg total) by mouth daily. 06/03/19   [name], MD  cyanocobalamin (,VITAMIN B-12,) 1000 MCG/ML injection Inject 1 mL weekly into the muscle monthly 11/08/18   [name], MD  diphenhydrAMINE (DIPHENHIST) 25 mg capsule Take 25 mg by mouth at bedtime as needed.     [provider]  escitalopram (LEXAPRO) 10  MG tablet Take 1 tablet (10 mg total) by mouth daily. 06/03/19   [name], MD  ferrous sulfate 325 (65 FE) MG EC tablet Take 325 mg by mouth daily with breakfast.    [provider]  fluticasone furoate-vilanterol (BREO ELLIPTA) 100-25 MCG/INH AEPB Inhale 1 puff into the lungs daily. 06/04/19 07/04/19  [name], MD  Fluticasone-Salmeterol (ADVAIR DISKUS) 250-50 MCG/DOSE AEPB INHALE 1 PUFF BY MOUTH INTO THE LUNGS EVERY 12 HOURS RINSE MOUTH AFTER EACH USE 06/03/19   [name], MD  furosemide (LASIX) 40 MG tablet Take 1 tablet (40 mg total) by mouth daily. 2 for weight gain > 2 lbs overnight 06/03/19   [name], MD  ipratropium-albuterol (DUONEB) 0.5-2.5 (3) MG/3ML SOLN Take 3 mLs by nebulization every 6 (six) hours as needed. 08/05/18   [name], MD  nitroGLYCERIN (NITROSTAT) 0.4 MG SL tablet Place 1 tablet (0.4 mg total) under the tongue every 5 (five) minutes as needed for chest pain. 06/19/16   [name], MD  omeprazole (PRILOSEC) 40 MG capsule Take 1 capsule (40 mg total) by  mouth daily. 06/03/19   [name], MD  potassium chloride (K-DUR) 10 MEQ tablet Take 1 tablet (10 mEq total) by mouth daily. 06/03/19   [name], MD  predniSONE (DELTASONE) 10 MG tablet 6 tablets on Day 1 , then reduce by 1 tablet daily until gone Patient not taking: Reported on 06/17/2019 06/10/19   [name], MD  sacubitril-valsartan (ENTRESTO) 24-26 MG Take 1 tablet by mouth 2 (two) times daily. Patient not taking: Reported on 06/12/2019 06/03/19   [name], MD  Tiotropium Bromide Monohydrate (SPIRIVA RESPIMAT) 2.5 MCG/ACT AERS Inhale 5 mcg into the lungs every morning. Patient not taking: Reported on 06/17/2019 06/03/19   [name], MD  traZODone (DESYREL) 50 MG tablet Take 0.5-1 tablets (25-50 mg total) by mouth daily. One hour before bedtime 06/03/19   [name], MD    Allergies Hydrocodone-acetaminophen    Social History Social History   Tobacco Use  . Smoking status: Current Every Day Smoker    Packs/day: 0.25    Types: Cigarettes  . Smokeless tobacco: Never Used  . Tobacco comment: reports smoking 2-3 cigarettes/ day  Substance Use Topics  . Alcohol use: Yes    Alcohol/week: 2.0 standard drinks    Types: 2 Shots of liquor per week  . Drug use: No    Review of Systems Patient denies headaches, rhinorrhea, blurry vision, numbness, shortness of breath, chest pain, edema, cough, abdominal pain, nausea, vomiting, diarrhea, dysuria, fevers, rashes or hallucinations unless otherwise stated above in HPI. ____________________________________________   PHYSICAL EXAM:  VITAL SIGNS: There were no vitals filed for this visit.  Constitutional: Alert and oriented. tripoding in acute respiratory distress Eyes: Conjunctivae are normal.  Head: Atraumatic. Nose: No congestion/rhinnorhea. Mouth/Throat: Mucous membranes are moist.   Neck: No stridor. Painless ROM.  Cardiovascular: Normal rate, regular rhythm. Grossly normal heart sounds.  Good peripheral  circulation. Respiratory: Moderate respiratory distress requiring placement on BiPAP.  Diminished breath sounds throughout.  Tachypnea.   Gastrointestinal: Soft and nontender. No distention. No abdominal bruits. No CVA tenderness. Genitourinary: deferred Musculoskeletal: No lower extremity tenderness nor edema.  No joint effusions. Neurologic:  Normal speech and language. No gross focal neurologic deficits are appreciated. No facial droop Skin:  Skin is warm, dry and intact. No rash noted. Psychiatric: Mood and affect are anxious Speech and behavior are  normal.  ____________________________________________   LABS (all labs ordered are listed, but only abnormal results are displayed)  No results found for this or any previous visit (from the [name] 24 hour(s)). ____________________________________________  EKG My review and personal interpretation at Time: 8:10   Indication: sob  Rate: 140  Rhythm: sinus Axis: normal Other: inferolateral t wave inversions and st abn ____________________________________________  RADIOLOGY  I personally reviewed all radiographic images ordered to evaluate for the above acute complaints and reviewed radiology reports and findings.  These findings were personally discussed with the patient.  Please see medical record for radiology report.  ____________________________________________   PROCEDURES  Procedure(s) performed:  .Critical Care Performed by: Merlyn Lot, MD Authorized by: Merlyn Lot, MD   Critical care provider statement:    Critical care time (minutes):  30   Critical care time was exclusive of:  Separately billable procedures and treating other patients   Critical care was necessary to treat or prevent imminent or life-threatening deterioration of the following conditions:  Respiratory failure   Critical care was time spent personally by me on the following activities:  Development of treatment plan with patient or surrogate,  discussions with consultants, evaluation of patient's response to treatment, examination of patient, obtaining history from patient or surrogate, ordering and performing treatments and interventions, ordering and review of laboratory studies, ordering and review of radiographic studies, pulse oximetry, re-evaluation of patient's condition and review of old charts      Critical Care performed: yes ____________________________________________   INITIAL IMPRESSION / [place] / ED COURSE  Pertinent labs & imaging results that were available during my care of the patient were reviewed by me and considered in my medical decision making (see chart for details).   DDX: Asthma, copd, CHF, pna, ptx, malignancy, Pe, anemia   CHARNELL PEPLINSKI is a 68 y.o. who presents to the ED with acute respiratory distress as described above.  Patient placed on BiPAP for respiratory support.  Patient is critically ill.  Suspect more COPD than CHF.  Does not have any lower extremity edema.  Diminished breath sounds throughout I do not appreciate any significant crackles.  Will start on Solu-Medrol as well as duo nebs.  She is not significantly hypertensive either.  EKG without any significant changes from previous though it is an abnormal EKG.  Clinical Course as of Jun 19 947  Thu Jun 19, 2019  0902 Symptoms are improving.  Does have a white count.  Chest x-ray suggesting possible multilobar pneumonia versus edema.  Based on her respiratory distress will add on antibiotics obtain blood cultures add on procalcitonin.   [PR]  2694 Patient feels much improved.  Does have elevated BNP.  Will add on Lasix as her repeat blood pressure is now more hypertensive but patient clinically feels much improved.   [PR]  0947 Covid negative. Will discuss with hospitalist for admission.  Patient clinically much improved but still on bipap.   [PR]    Clinical Course User Index [PR] Merlyn Lot, MD    The  patient was evaluated in Emergency Department today for the symptoms described in the history of present illness. He/she was evaluated in the context of the global COVID-19 pandemic, which necessitated consideration that the patient might be at risk for infection with the SARS-CoV-2 virus that causes COVID-19. Institutional protocols and algorithms that pertain to the evaluation of patients at risk for COVID-19 are in a state of rapid change based on information released by regulatory  bodies including the CDC and federal and state organizations. These policies and algorithms were followed during the patient's care in the ED.  As part of my medical decision making, I reviewed the following data within the [place] notes reviewed and incorporated, Labs reviewed, notes from prior ED visits and  Controlled Substance Database   ____________________________________________   FINAL CLINICAL IMPRESSION(S) / ED DIAGNOSES  Final diagnoses:  Respiratory distress  COPD exacerbation ([place])  Acute on chronic congestive heart failure, unspecified heart failure type ([place])      NEW MEDICATIONS STARTED DURING THIS VISIT:  New Prescriptions   No medications on file     Note:  This document was prepared using Dragon voice recognition software and may include unintentional dictation errors.    Merlyn Lot, MD 06/19/19 [phone]

## 2019-06-19 NOTE — ED Notes (Signed)
Approximately 5 minutes after starting Vancomycin, patient complaining of heaviness in chest, nausea and stating "I'm just going to die". This RN stopped vancomycin and cefepime infusion. MD notified. Patient pulling at mask, stating "i'm not getting any air". O2 Sat 89%  Bipap mask removed and this RN provided non-rebreather. RT notified. MD gave verbal order for IV ativan. Ativan given and patient breathing slightly improved. Bipap replaced on patient. RT at bedside to check bipap. Patient given additional duoneb. Patient O2 improved to 95% after starting duoneb.

## 2019-06-19 NOTE — Telephone Encounter (Signed)
Patient lab appt for today cancelled.  Pt is in the ER.

## 2019-06-19 NOTE — ED Notes (Signed)
ED TO INPATIENT HANDOFF REPORT  ED Nurse Name and Phone #: Mickel Baas [phone]  S Name/Age/Gender Ashley [name] 68 y.o. female Room/Bed: ED01A/ED01A  Code Status   Code Status: Prior  Home/SNF/Other Home Patient oriented to: self Is this baseline? Yes   Triage Complete: Triage complete  Chief Complaint difficulty breathing  Triage Note Patient to ED via EMS in respiratory distress. Patient reports worsening SOB x 3-4 days. States she was trying to get to the doctor this morning but had to stop on the side of the road. Patient has history or COPD and CHF. MD and RT at bedside.    Allergies Allergies  Allergen Reactions  . Hydrocodone-Acetaminophen Nausea Only    Level of Care/Admitting Diagnosis ED Disposition    ED Disposition Condition [facility]: [place] [[id number]]  Level of Care: Stepdown [14]  Covid Evaluation: Confirmed COVID Negative  Diagnosis: Acute respiratory failure with hypoxia [facility]) [[id number]]  Admitting Physician: Henreitta Leber [[id number]]  Attending Physician: Henreitta Leber [[id number]]  Estimated length of stay: past midnight tomorrow  Certification:: I certify this patient will need inpatient services for at least 2 midnights  PT Class (Do Not Modify): Inpatient [101]  PT Acc Code (Do Not Modify): Private [1]       B Medical/Surgery History Past Medical History:  Diagnosis Date  . AICD (automatic cardioverter/defibrillator) present    on right side  . Anemia   . CAD (coronary artery disease)    s/p CABG  . CHF (congestive heart failure) ([place])   . Chronic kidney disease    renal artery stenosis  . Colon cancer ([place])   . Depression   . GERD (gastroesophageal reflux disease)   . Hx of colonic polyps   . Hyperlipidemia   . Hypertension   . Mitral valve disorder    s/p mitral valve repair wth CABG  . Myocardial infarction ([place])   . Peripheral vascular disease ([place])   . Presence of permanent cardiac  pacemaker    Pacemaker/ Defibrillator   Past Surgical History:  Procedure Laterality Date  . arm surgery     fracture, has plates and screws  . CABG with mitral valve repair    . CARDIAC CATHETERIZATION    . COLON SURGERY     colon cancer  . COLONOSCOPY WITH PROPOFOL N/A 10/09/2016   Procedure: COLONOSCOPY WITH PROPOFOL;  Surgeon: Jonathon Bellows, MD;  Location: [facility] ENDOSCOPY;  Service: Endoscopy;  Laterality: N/A;  . COLONOSCOPY WITH PROPOFOL N/A 07/24/2018   Procedure: COLONOSCOPY WITH PROPOFOL;  Surgeon: Virgel Manifold, MD;  Location: [facility] ENDOSCOPY;  Service: Endoscopy;  Laterality: N/A;  . CORONARY ARTERY BYPASS GRAFT    . ENTEROSCOPY N/A 11/21/2018   Procedure: ENTEROSCOPY-BALLOON;  Surgeon: Jonathon Bellows, MD;  Location: [facility] ENDOSCOPY;  Service: Gastroenterology;  Laterality: N/A;  . ESOPHAGOGASTRODUODENOSCOPY (EGD) WITH PROPOFOL N/A 07/24/2018   Procedure: ESOPHAGOGASTRODUODENOSCOPY (EGD) WITH PROPOFOL;  Surgeon: Virgel Manifold, MD;  Location: [facility] ENDOSCOPY;  Service: Endoscopy;  Laterality: N/A;  . GIVENS CAPSULE STUDY N/A 08/16/2018   Procedure: GIVENS CAPSULE STUDY;  Surgeon: Virgel Manifold, MD;  Location: [facility] ENDOSCOPY;  Service: Endoscopy;  Laterality: N/A;  . pace maker defib  2016  . PERIPHERAL VASCULAR CATHETERIZATION N/A 10/23/2016   Procedure: Renal Angiography;  Surgeon: Algernon Huxley, MD;  Location: [facility];  Service: Cardiovascular;  Laterality: N/A;  . TOTAL ABDOMINAL HYSTERECTOMY  1999   history of abnormal pap  A IV Location/Drains/Wounds Patient Lines/Drains/Airways Status   Active Line/Drains/Airways    Name:   Placement date:   Placement time:   Site:   Days:   Peripheral IV 06/19/19 Right Antecubital   06/19/19    0841    Antecubital   less than 1   Peripheral IV 06/19/19 Right Hand   06/19/19    1010    Hand   less than 1          Intake/Output Last 24 hours  Intake/Output Summary (Last 24 hours) at 06/19/2019 1318 Last  data filed at 06/19/2019 1130 Gross per 24 hour  Intake 1000 ml  Output -  Net 1000 ml    Labs/Imaging Results for orders placed or performed during the hospital encounter of 06/19/19 (from the past 48 hour(s))  CBC with Differential/Platelet     Status: Abnormal   Collection Time: 06/19/19  8:17 AM  Result Value Ref Range   WBC 18.3 (H) 4.0 - 10.5 K/uL   RBC 3.85 (L) 3.87 - 5.11 MIL/uL   Hemoglobin 13.5 12.0 - 15.0 g/dL   HCT 40.7 36.0 - 46.0 %   MCV 105.7 (H) 80.0 - 100.0 fL   MCH 35.1 (H) 26.0 - 34.0 pg   MCHC 33.2 30.0 - 36.0 g/dL   RDW 15.2 11.5 - 15.5 %   Platelets 416 (H) 150 - 400 K/uL   nRBC 0.2 0.0 - 0.2 %   Neutrophils Relative % 53 %   Neutro Abs 9.7 (H) 1.7 - 7.7 K/uL   Lymphocytes Relative 33 %   Lymphs Abs 6.1 (H) 0.7 - 4.0 K/uL   Monocytes Relative 9 %   Monocytes Absolute 1.6 (H) 0.1 - 1.0 K/uL   Eosinophils Relative 2 %   Eosinophils Absolute 0.4 0.0 - 0.5 K/uL   Basophils Relative 1 %   Basophils Absolute 0.2 (H) 0.0 - 0.1 K/uL   WBC Morphology Abnormal lymphocytes present    RBC Morphology MORPHOLOGY UNREMARKABLE    Smear Review Normal platelet morphology    Immature Granulocytes 2 %   Abs Immature Granulocytes 0.36 (H) 0.00 - 0.07 K/uL    Comment: Performed at [facility], [address] [place], [place] [address]  Comprehensive metabolic panel     Status: Abnormal   Collection Time: 06/19/19  8:17 AM  Result Value Ref Range   Sodium 137 135 - 145 mmol/L    Comment: RESULTS VERIFIED BY REPEAT TESTING   Potassium 3.9 3.5 - 5.1 mmol/L    Comment: HEMOLYSIS AT THIS LEVEL MAY AFFECT RESULT   Chloride 96 (L) 98 - 111 mmol/L   CO2 18 (L) 22 - 32 mmol/L   Glucose, Bld 325 (H) 70 - 99 mg/dL   BUN 15 8 - 23 mg/dL   Creatinine, Ser 1.28 (H) 0.44 - 1.00 mg/dL   Calcium 8.6 (L) 8.9 - 10.3 mg/dL   Total Protein 7.2 6.5 - 8.1 g/dL   Albumin 3.9 3.5 - 5.0 g/dL   AST 33 15 - 41 U/L   ALT 17 0 - 44 U/L   Alkaline Phosphatase 86 38 - 126 U/L    Total Bilirubin 1.2 0.3 - 1.2 mg/dL   GFR calc non Af Amer 44 (L) >60 mL/min   GFR calc Af Amer 50 (L) >60 mL/min   Anion gap 23 (H) 5 - 15    Comment: Performed at [facility], [address] [place], [place] [address]  Troponin I (High Sensitivity)  Status: Abnormal   Collection Time: 06/19/19  8:17 AM  Result Value Ref Range   Troponin I (High Sensitivity) 29 (H) <18 ng/L    Comment: (NOTE) Elevated high sensitivity troponin I (hsTnI) values and significant  changes across serial measurements may suggest ACS but many other  chronic and acute conditions are known to elevate hsTnI results.  Refer to the "Links" section for chest pain algorithms and additional  guidance. Performed at [facility], [address] [place], [place] [address]   Brain natriuretic peptide     Status: Abnormal   Collection Time: 06/19/19  8:18 AM  Result Value Ref Range   B Natriuretic Peptide 3,319.0 (H) 0.0 - 100.0 pg/mL    Comment: Performed at [facility], [address] [place], [place] [address]  SARS Coronavirus 2 [facility] order, Performed in [facility] hospital lab) Nasopharyngeal Nasopharyngeal Swab     Status: None   Collection Time: 06/19/19  8:18 AM   Specimen: Nasopharyngeal Swab  Result Value Ref Range   SARS Coronavirus 2 NEGATIVE NEGATIVE    Comment: (NOTE) If result is NEGATIVE SARS-CoV-2 target nucleic acids are NOT DETECTED. The SARS-CoV-2 RNA is generally detectable in upper and lower  respiratory specimens during the acute phase of infection. The lowest  concentration of SARS-CoV-2 viral copies this assay can detect is 250  copies / mL. A negative result does not preclude SARS-CoV-2 infection  and should not be used as the sole basis for treatment or other  patient management decisions.  A negative result may occur with  improper specimen collection / handling, submission of specimen other  than nasopharyngeal swab, presence of viral  mutation(s) within the  areas targeted by this assay, and inadequate number of viral copies  (<250 copies / mL). A negative result must be combined with clinical  observations, patient history, and epidemiological information. If result is POSITIVE SARS-CoV-2 target nucleic acids are DETECTED. The SARS-CoV-2 RNA is generally detectable in upper and lower  respiratory specimens dur ing the acute phase of infection.  Positive  results are indicative of active infection with SARS-CoV-2.  Clinical  correlation with patient history and other diagnostic information is  necessary to determine patient infection status.  Positive results do  not rule out bacterial infection or co-infection with other viruses. If result is PRESUMPTIVE POSTIVE SARS-CoV-2 nucleic acids MAY BE PRESENT.   A presumptive positive result was obtained on the submitted specimen  and confirmed on repeat testing.  While 2019 novel coronavirus  (SARS-CoV-2) nucleic acids may be present in the submitted sample  additional confirmatory testing may be necessary for epidemiological  and / or clinical management purposes  to differentiate between  SARS-CoV-2 and other Sarbecovirus currently known to infect humans.  If clinically indicated additional testing with an alternate test  methodology [phone]) is advised. The SARS-CoV-2 RNA is generally  detectable in upper and lower respiratory sp ecimens during the acute  phase of infection. The expected result is Negative. Fact Sheet for Patients:  StrictlyIdeas.no Fact Sheet for Healthcare Providers: BankingDealers.co.za This test is not yet approved or cleared by the [place] FDA and has been authorized for detection and/or diagnosis of SARS-CoV-2 by FDA under an Emergency Use Authorization (EUA).  This EUA will remain in effect (meaning this test can be used) for the duration of the COVID-19 declaration under Section 564(b)(1)  of the Act, 21 U.S.C. section 360bbb-3(b)(1), unless the authorization is terminated or revoked sooner. Performed at [facility], [place], [place] [address]   Procalcitonin     Status: None   Collection Time: 06/19/19 10:03 AM  Result Value Ref Range   Procalcitonin <0.10 ng/mL    Comment:        Interpretation: PCT (Procalcitonin) <= 0.5 ng/mL: Systemic infection (sepsis) is not likely. Local bacterial infection is possible. (NOTE)       Sepsis PCT Algorithm           Lower Respiratory Tract                                      Infection PCT Algorithm    ----------------------------     ----------------------------         PCT < 0.25 ng/mL                PCT < 0.10 ng/mL         Strongly encourage             Strongly discourage   discontinuation of antibiotics    initiation of antibiotics    ----------------------------     -----------------------------       PCT 0.25 - 0.50 ng/mL            PCT 0.10 - 0.25 ng/mL               OR       >80% decrease in PCT            Discourage initiation of                                            antibiotics      Encourage discontinuation           of antibiotics    ----------------------------     -----------------------------         PCT >= 0.50 ng/mL              PCT 0.26 - 0.50 ng/mL               AND        <80% decrease in PCT             Encourage initiation of                                             antibiotics       Encourage continuation           of antibiotics    ----------------------------     -----------------------------        PCT >= 0.50 ng/mL                  PCT > 0.50 ng/mL               AND         increase in PCT                  Strongly encourage  initiation of antibiotics    Strongly encourage escalation           of antibiotics                                     -----------------------------                                           PCT <=  0.25 ng/mL                                                 OR                                        > 80% decrease in PCT                                     Discontinue / Do not initiate                                             antibiotics Performed at [facility], [address] [place], [place] [address]   Blood gas, venous ([facility], [facility], [facility])     Status: Abnormal   Collection Time: 06/19/19 10:04 AM  Result Value Ref Range   FIO2 40.00    Delivery systems BILEVEL POSITIVE AIRWAY PRESSURE    pH, Ven 7.43 7.250 - 7.430   pCO2, Ven 42 (L) 44.0 - 60.0 mmHg   pO2, Ven 78.0 (H) 32.0 - 45.0 mmHg   Bicarbonate 27.9 20.0 - 28.0 mmol/L   Acid-Base Excess 3.2 (H) 0.0 - 2.0 mmol/L   O2 Saturation 95.8 %   Patient temperature 37.0    Sample type VENOUS     Comment: Performed at [facility], [address] [place], [place] [address]  Lactic acid, plasma     Status: Abnormal   Collection Time: 06/19/19 10:04 AM  Result Value Ref Range   Lactic Acid, Venous 5.0 (HH) 0.5 - 1.9 mmol/L    Comment: CRITICAL RESULT CALLED TO, READ BACK BY AND VERIFIED WITH Eyan Hagood ON 06/19/2019 AT 1106 TIK/HKP Performed at [facility], [place], [place] [address]   Troponin I (High Sensitivity)     Status: Abnormal   Collection Time: 06/19/19 10:13 AM  Result Value Ref Range   Troponin I (High Sensitivity) 23 (H) <18 ng/L    Comment: (NOTE) Elevated high sensitivity troponin I (hsTnI) values and significant  changes across serial measurements may suggest ACS but many other  chronic and acute conditions are known to elevate hsTnI results.  Refer to the "Links" section for chest pain algorithms and additional  guidance. Performed at [facility], [address] [place], [place] [address]    Dg Chest Portable 1 View  Result Date: 06/19/2019 CLINICAL DATA:  Evaluate for pneumonia. EXAM: PORTABLE CHEST 1  VIEW COMPARISON:  May 29, 2019 FINDINGS:  Postsurgical changes from CABG. Stable appearance of cardiac pacemaker leads. Enlarged cardiac silhouette. Diffuse bilateral interstitial and alveolar opacities. No evidence of pleural effusion or pneumothorax. Osseous structures are without acute abnormality. Soft tissues are grossly normal. IMPRESSION: Diffuse bilateral interstitial and alveolar opacities which may represent mixed pattern pulmonary edema or less likely atypical multifocal pneumonia. Electronically Signed   By: Fidela Salisbury M.D.   On: 06/19/2019 08:44    Pending Labs Unresulted Labs (From admission, onward)    Start     Ordered   06/19/19 0902  Blood Culture (routine x 2)  BLOOD CULTURE X 2,   STAT     06/19/19 0901   Signed and Held  Basic metabolic panel  Tomorrow morning,   R     Signed and Held   Signed and Held  CBC  Tomorrow morning,   R     Signed and Held   Signed and Held  CBC  (enoxaparin (LOVENOX)    CrCl >/= 30 ml/min)  Once,   R    Comments: Baseline for enoxaparin therapy IF NOT ALREADY DRAWN.  Notify MD if PLT < 100 K.    Signed and Held   Signed and Held  Creatinine, serum  (enoxaparin (LOVENOX)    CrCl >/= 30 ml/min)  Once,   R    Comments: Baseline for enoxaparin therapy IF NOT ALREADY DRAWN.    Signed and Held   Signed and Held  Creatinine, serum  (enoxaparin (LOVENOX)    CrCl >/= 30 ml/min)  Weekly,   R    Comments: while on enoxaparin therapy    Signed and Held          Vitals/Pain Today's Vitals   06/19/19 1100 06/19/19 1200 06/19/19 1230 06/19/19 1258  BP: (!) 145/100 135/79 (!) 150/98   Pulse: 95 90 94   Resp: (!) 29 (!) 24 (!) 24   Temp:      TempSrc:      SpO2: 96% 96% 98%   Weight:      Height:      PainSc:    Asleep    Isolation Precautions No active isolations  Medications Medications  vancomycin (VANCOCIN) 500 mg in sodium chloride 0.9 % 100 mL IVPB (has no administration in time range)  insulin aspart (novoLOG) injection 0-9 Units (has no administration in time  range)  insulin aspart (novoLOG) injection 0-5 Units (has no administration in time range)  ipratropium-albuterol (DUONEB) 0.5-2.5 (3) MG/3ML nebulizer solution 3 mL (3 mLs Nebulization Given 06/19/19 0827)  methylPREDNISolone sodium succinate (SOLU-MEDROL) 125 mg/2 mL injection 125 mg (125 mg Intravenous Given 06/19/19 0831)  ipratropium-albuterol (DUONEB) 0.5-2.5 (3) MG/3ML nebulizer solution 3 mL (3 mLs Nebulization Given 06/19/19 0827)  vancomycin (VANCOCIN) IVPB 1000 mg/200 mL premix ( Intravenous Restarted 06/19/19 1133)  ceFEPIme (MAXIPIME) 2 g in sodium chloride 0.9 % 100 mL IVPB (0 g Intravenous Stopped 06/19/19 1130)  furosemide (LASIX) injection 40 mg (40 mg Intravenous Given 06/19/19 1012)  labetalol (NORMODYNE) injection 5 mg (5 mg Intravenous Given 06/19/19 1052)  LORazepam (ATIVAN) injection 0.5 mg (0.5 mg Intravenous Given 06/19/19 1044)  ipratropium-albuterol (DUONEB) 0.5-2.5 (3) MG/3ML nebulizer solution 3 mL (3 mLs Nebulization Given 06/19/19 1046)    Mobility walks Moderate fall risk   Focused Assessments Pulmonary Assessment Handoff:  Lung sounds: Bilateral Breath Sounds: Expiratory wheezes, Diminished L Breath Sounds: Expiratory wheezes, Diminished R Breath Sounds: Expiratory wheezes, Diminished O2 Device: Bi-PAP  R Recommendations: See Admitting Provider Note  Report given to:   Additional Notes:

## 2019-06-19 NOTE — Progress Notes (Signed)
PHARMACY -  BRIEF ANTIBIOTIC NOTE   Pharmacy has received consult(s) for Vancomycin and Cefepime from an ED provider.  The patient's profile has been reviewed for ht/wt/allergies/indication/available labs.    One time order(s) placed for Vancomycin and Cefepime  Further antibiotics/pharmacy consults should be ordered by admitting physician if indicated.                       Thank you, Vira Blanco 06/19/2019  10:17 AM

## 2019-06-19 NOTE — Consult Note (Signed)
Name: Ashley [name] MRN: [id number] DOB: 1951-01-08    ADMISSION DATE:  06/19/2019 CONSULTATION DATE: 06/19/2019  REFERRING MD : Dr. Verdell Carmine   CHIEF COMPLAINT: Shortness of Breath   BRIEF PATIENT DESCRIPTION:  68 yo female admitted with acute on chronic respiratory failure secondary to acute CHF exacerbation requiring Bipap   SIGNIFICANT EVENTS/STUDIES:  08/13-Pt admitted to the stepdown unit on Bipap   HISTORY OF PRESENT ILLNESS:   This is a 68 yo female with a PMH of AICD, PVD, MI, Mitral Valve Disorder s/p Mitral Valve Repair with CABG, HTN, HLD, GERD, Colonic Polyps, Colon Cancer, Depression, Chronic Systolic CHF, CAD, and Anemia.  She presented to [facility] ER on 08/13 via EMS with c/o shortness of breath onset of symptoms today. Pt attempted to see her PCP 08/13, however while driving she had to pull over on the side of the road due to worsening shortness of breath and notified EMS.  Upon arrival to the ER pt in tripod position due to severe respiratory failure requiring Bipap.  Lab results revealed CO2 18, glucose 325, creatinine 1.28, anion gap 23, BNP 3,319, troponin 29, wbc 18.3, lactic acid 5.0, pct <0.10, and vbg pH 7.43/pCO2 42.  CXR concerning for possible pneumonia and pulmonary edema.  She received cefepime, 40 mg iv lasix, duoneb x3, solumedrol, and vancomycin. Pt also hypertensive sbp 150's to 190's requiring 5 mg iv labetalol x1 dose.  She was subsequently admitted to the stepdown unit by hospitalist team for additional workup and treatment.   PAST MEDICAL HISTORY :   has a past medical history of AICD (automatic cardioverter/defibrillator) present, Anemia, CAD (coronary artery disease), CHF (congestive heart failure) ([place]), Chronic kidney disease, Colon cancer ([place]), Depression, GERD (gastroesophageal reflux disease), colonic polyps, Hyperlipidemia, Hypertension, Mitral valve disorder, Myocardial infarction [facility]), Peripheral vascular disease ([place]), and Presence of permanent  cardiac pacemaker.  has a past surgical history that includes Total abdominal hysterectomy (1999); CABG with mitral valve repair; Colon surgery; arm surgery; pace maker defib (2016); Coronary artery bypass graft; Colonoscopy with propofol (N/A, 10/09/2016); Cardiac catheterization (N/A, 10/23/2016); Colonoscopy with propofol (N/A, 07/24/2018); Esophagogastroduodenoscopy (egd) with propofol (N/A, 07/24/2018); Givens capsule study (N/A, 08/16/2018); Cardiac catheterization; and enteroscopy (N/A, 11/21/2018). Prior to Admission medications   Medication Sig Start Date End Date Taking? Authorizing Provider  albuterol (VENTOLIN HFA) 108 (90 Base) MCG/ACT inhaler INHALE 2 PUFFS BY MOUTH INTO THE LUNGS EVERY 6 HOURS AS NEEDED FOR WHEEZING 06/03/19  Yes [name], MD  ALPRAZolam Duanne Moron) 0.5 MG tablet Take 0.5-1 tablets (0.25-0.5 mg total) by mouth at bedtime. TAKE ONE TABLET BY MOUTH AT BEDTIME AS NEEDED FOR ANXIETY 02/24/19  Yes [name], MD  aspirin 81 MG tablet Take 81 mg by mouth daily.   Yes [provider]  atorvastatin (LIPITOR) 40 MG tablet Take 1 tablet (40 mg total) by mouth daily. 06/03/19  Yes [name], MD  carvedilol (COREG) 6.25 MG tablet TAKE ONE TABLET BY MOUTH TWICE DAILY WITH MEAL 06/03/19  Yes [name], MD  clopidogrel (PLAVIX) 75 MG tablet Take 1 tablet (75 mg total) by mouth daily. 06/03/19  Yes [name], MD  cyanocobalamin (,VITAMIN B-12,) 1000 MCG/ML injection Inject 1 mL weekly into the muscle monthly 11/08/18  Yes [name], MD  escitalopram (LEXAPRO) 10 MG tablet Take 1 tablet (10 mg total) by mouth daily. 06/03/19  Yes [name], MD  ferrous sulfate 325 (65 FE) MG EC tablet Take 325 mg by  mouth daily with breakfast.   Yes [provider]  fluticasone furoate-vilanterol (BREO ELLIPTA) 100-25 MCG/INH AEPB Inhale 1 puff into the lungs daily. 06/04/19 07/04/19 Yes [name], MD  Fluticasone-Salmeterol (ADVAIR DISKUS) 250-50 MCG/DOSE  AEPB INHALE 1 PUFF BY MOUTH INTO THE LUNGS EVERY 12 HOURS RINSE MOUTH AFTER EACH USE 06/03/19  Yes [name], MD  furosemide (LASIX) 40 MG tablet Take 1 tablet (40 mg total) by mouth daily. 2 for weight gain > 2 lbs overnight Patient taking differently: Take 40 mg by mouth daily.  06/03/19  Yes [name], MD  ipratropium-albuterol (DUONEB) 0.5-2.5 (3) MG/3ML SOLN Take 3 mLs by nebulization every 6 (six) hours as needed. 08/05/18  Yes [name], MD  omeprazole (PRILOSEC) 40 MG capsule Take 1 capsule (40 mg total) by mouth daily. 06/03/19  Yes [name], MD  potassium chloride (K-DUR) 10 MEQ tablet Take 1 tablet (10 mEq total) by mouth daily. 06/03/19  Yes [name], MD  sacubitril-valsartan (ENTRESTO) 24-26 MG Take 1 tablet by mouth 2 (two) times daily. 06/03/19  Yes [name], MD  traZODone (DESYREL) 50 MG tablet Take 0.5-1 tablets (25-50 mg total) by mouth daily. One hour before bedtime 06/03/19  Yes [name], MD  acetaminophen (TYLENOL) 500 MG tablet Maximum 6 tablets a day. Patient taking differently: Take 500 mg by mouth every 4 (four) hours as needed for fever. Maximum 6 tablets a day. 02/09/15   Rubbie Battiest, RN  diphenhydrAMINE (DIPHENHIST) 25 mg capsule Take 25 mg by mouth at bedtime as needed.     [provider]  nitroGLYCERIN (NITROSTAT) 0.4 MG SL tablet Place 1 tablet (0.4 mg total) under the tongue every 5 (five) minutes as needed for chest pain. 06/19/16   [name], MD  predniSONE (DELTASONE) 10 MG tablet 6 tablets on Day 1 , then reduce by 1 tablet daily until gone Patient not taking: Reported on 06/17/2019 06/10/19   [name], MD  Tiotropium Bromide Monohydrate (SPIRIVA RESPIMAT) 2.5 MCG/ACT AERS Inhale 5 mcg into the lungs every morning. Patient not taking: Reported on 06/17/2019 06/03/19   [name], MD   Allergies  Allergen Reactions  . Hydrocodone-Acetaminophen Nausea Only    FAMILY HISTORY:  family history includes  Arthritis in her maternal grandmother and mother; Cancer in her mother and sister; Diabetes in her father, maternal grandmother, and mother; Heart disease in her brother, father, mother, and paternal grandfather; Hyperlipidemia in her father and mother; Hypertension in her father, maternal grandfather, maternal grandmother, mother, paternal grandfather, and paternal grandmother; Kidney disease in her brother. SOCIAL HISTORY:  reports that she has been smoking cigarettes. She has been smoking about 0.25 packs per day. She has never used smokeless tobacco. She reports current alcohol use of about 2.0 standard drinks of alcohol per week. She reports that she does not use drugs.  REVIEW OF SYSTEMS: Positives in BOLD  Constitutional: Negative for fever, chills, weight loss, malaise/fatigue and diaphoresis.  HENT: Negative for hearing loss, ear pain, nosebleeds, congestion, sore throat, neck pain, tinnitus and ear discharge.   Eyes: Negative for blurred vision, double vision, photophobia, pain, discharge and redness.  Respiratory: cough, hemoptysis, sputum production, shortness of breath, wheezing and stridor.   Cardiovascular: Negative for chest pain, palpitations, orthopnea, claudication, leg swelling and PND.  Gastrointestinal: Negative for heartburn, nausea, vomiting, abdominal pain, diarrhea, constipation, blood in stool and melena.  Genitourinary: Negative for dysuria, urgency, frequency, hematuria  and flank pain.  Musculoskeletal: Negative for myalgias, back pain, joint pain and falls.  Skin: Negative for itching and rash.  Neurological: Negative for dizziness, tingling, tremors, sensory change, speech change, focal weakness, seizures, loss of consciousness, weakness and headaches.  Endo/Heme/Allergies: Negative for environmental allergies and polydipsia. Does not bruise/bleed easily.  SUBJECTIVE:  Pt off Bipap on 4L O2 via nasal canula and states her breathing has improved   VITAL SIGNS: Temp:   [96.6 F (35.9 C)] 96.6 F (35.9 C) (08/13 0815) Pulse Rate:  [130-134] 130 (08/13 0827) Resp:  [35] 35 (08/13 0827) BP: (121)/(75) 121/75 (08/13 0815) SpO2:  [97 %-99 %] 97 % (08/13 0827) FiO2 (%):  [40 %] 40 % (08/13 0845) Weight:  [74.5 kg] 74.5 kg (08/13 0820)  PHYSICAL EXAMINATION: General: well developed, well nourished female, NAD  Neuro: alert and oriented, follows commands  HEENT: supple, mild JVD  Cardiovascular: sinus rhythm, no R/G  Lungs: crackles present left base, all other lobes clear, even non labored  Abdomen: +BS x4, soft, non tender, non distended  Musculoskeletal: normal bulk and tone, no edema Skin: intact no rashes or lesions present   Recent Labs  Lab 06/19/19 0817  NA 137  K 3.9  CL 96*  CO2 18*  BUN 15  CREATININE 1.28*  GLUCOSE 325*   Recent Labs  Lab 06/19/19 0817  HGB 13.5  HCT 40.7  WBC 18.3*  PLT 416*   Dg Chest Portable 1 View  Result Date: 06/19/2019 CLINICAL DATA:  Evaluate for pneumonia. EXAM: PORTABLE CHEST 1 VIEW COMPARISON:  May 29, 2019 FINDINGS: Postsurgical changes from CABG. Stable appearance of cardiac pacemaker leads. Enlarged cardiac silhouette. Diffuse bilateral interstitial and alveolar opacities. No evidence of pleural effusion or pneumothorax. Osseous structures are without acute abnormality. Soft tissues are grossly normal. IMPRESSION: Diffuse bilateral interstitial and alveolar opacities which may represent mixed pattern pulmonary edema or less likely atypical multifocal pneumonia. Electronically Signed   By: Fidela Salisbury M.D.   On: 06/19/2019 08:44    ASSESSMENT / PLAN:  Acute respiratory failure secondary to CHF exacerbation  Hx: COPD Prn Bipap for dyspnea and/or hypoxia  Continue scheduled and prn bronchodilator therapy IV steroids wean as tolerated   Acute on chronic CHF exacerbation  Elevated troponin likely secondary to demand ischemia in setting of respiratory failure  Hypertension  Hx: AICD,  Mitral Valve Disorder, CAD, and Bronchiolitis-Interstitial Lung Disease  Continuous telemetry monitoring  Trend troponin's Continue iv lasix  Continue outpatient aspirin, atorvastatin, carvedilol, clopidogrel, and entresto Cardiology consulted appreciate input   Acute on chronic renal failure  Lactic acidosis  Trend BMP  Replace electrolytes as indicated  Monitor UOP Avoid nephrotoxic mediations   Leukocytosis although no obvious signs of infection  Trend WBC and monitor fever curve  Follow cultures  Will not start abx unless pt becomes febrile   Hyperglycemia likely secondary to steroids  CBG's ac/hs  SSI   GERD  Continue po protonix   Depression  Continue outpatient lexapro  VTE px: subq lovenox   Marda Stalker, [place] Pager [phone] (please enter 7 digits) PCCM Consult Pager [phone] (please enter 7 digits)

## 2019-06-19 NOTE — ED Notes (Signed)
Per Dr. Verdell Carmine, restarted vancomycin at slower rate. At bedside to monitor patient after restarting. Patient laying in bed with eyes closed and even, unlabored respirations. Will contineue to montor.

## 2019-06-19 NOTE — Progress Notes (Signed)
Inpatient Diabetes Program Recommendations  AACE/ADA: New Consensus Statement on Inpatient Glycemic Control (2015)  Target Ranges:  Prepandial:   less than 140 mg/dL      Peak postprandial:   less than 180 mg/dL (1-2 hours)      Critically ill patients:  140 - 180 mg/dL  Results for [facility]" (MRN [id number]) as of 06/19/2019 13:30  Ref. Range 06/19/2019 08:17  Glucose Latest Ref Range: 70 - 99 mg/dL 325 (H)   Results for Ashley [name], Ashley A "DEBBIE" (MRN [id number]) as of 06/19/2019 13:30  Ref. Range 05/28/2019 01:54  Hemoglobin A1C Latest Ref Range: 4.8 - 5.6 % 5.5   Review of Glycemic Control  Diabetes history: NO; steroid induced hyperglycemia Outpatient Diabetes medications: NA Current orders for Inpatient glycemic control: None  Inpatient Diabetes Program Recommendations:  Correction (SSI): Please consider ordering CBGs with Novolog 0-15 units TID with meals and Novolog 0-5 units QHS.  NOTE: Per chart review, patient does not have a DM hx but noted to have steroid induced hyperglycemia. Noted Prednisone 10 mg daily on home med list with note that patient took last dose on 06/16/19. Patient received Solumedrol 125 mg at 8:31 am today.  Thanks, Barnie Alderman, RN, MSN, CDE Diabetes Coordinator Inpatient Diabetes Program [phone] (Team Pager from 8am to 5pm)

## 2019-06-19 NOTE — Consult Note (Signed)
[facility] Cardiology  CARDIOLOGY CONSULT NOTE  Patient ID: Ashley [name] MRN: [id number] DOB/AGE: 02-26-1951 68 y.o.  Admit date: 06/19/2019 Referring Physician Melene Muller Primary Physician [facility] Primary Cardiologist Berna Gitto Reason for Consultation congestive heart failure  HPI: 68 year old female referred for evaluation of congestive heart failure.  Patient was in usual state of health until earlier today when she developed acute shortness of breath and presented to [facility] emergency room where she was noted to be hypoxic, placed on BiPAP, and given IV diuretics and antibiotics.  The patient reports overall clinical improvement.  ECG revealed sinus tachycardia with nonspecific intraventricular conduction delay.  She denies chest pain.  High-sensitivity troponins were 29 and 23, respectively.  BNP was 3319.  Chest x-ray revealed diffuse bilateral interstitial and alveolar opacities representing mixed pulmonary edema with possible atypical multifocal pneumonia.  The patient has known coronary disease, status post CABG with mitral valve repair 11/21/2005.  Patient has known ischemic cardiomyopathy, with LVEF 30%, status post by V ICD 01/2016.  Review of systems complete and found to be negative unless listed above     Past Medical History:  Diagnosis Date  . AICD (automatic cardioverter/defibrillator) present    on right side  . Anemia   . CAD (coronary artery disease)    s/p CABG  . CHF (congestive heart failure) ([place])   . Chronic kidney disease    renal artery stenosis  . Colon cancer ([place])   . Depression   . GERD (gastroesophageal reflux disease)   . Hx of colonic polyps   . Hyperlipidemia   . Hypertension   . Mitral valve disorder    s/p mitral valve repair wth CABG  . Myocardial infarction ([place])   . Peripheral vascular disease ([place])   . Presence of permanent cardiac pacemaker    Pacemaker/ Defibrillator    Past Surgical History:  Procedure Laterality Date  . arm surgery      fracture, has plates and screws  . CABG with mitral valve repair    . CARDIAC CATHETERIZATION    . COLON SURGERY     colon cancer  . COLONOSCOPY WITH PROPOFOL N/A 10/09/2016   Procedure: COLONOSCOPY WITH PROPOFOL;  Surgeon: Jonathon Bellows, MD;  Location: [facility] ENDOSCOPY;  Service: Endoscopy;  Laterality: N/A;  . COLONOSCOPY WITH PROPOFOL N/A 07/24/2018   Procedure: COLONOSCOPY WITH PROPOFOL;  Surgeon: Virgel Manifold, MD;  Location: [facility] ENDOSCOPY;  Service: Endoscopy;  Laterality: N/A;  . CORONARY ARTERY BYPASS GRAFT    . ENTEROSCOPY N/A 11/21/2018   Procedure: ENTEROSCOPY-BALLOON;  Surgeon: Jonathon Bellows, MD;  Location: [facility] ENDOSCOPY;  Service: Gastroenterology;  Laterality: N/A;  . ESOPHAGOGASTRODUODENOSCOPY (EGD) WITH PROPOFOL N/A 07/24/2018   Procedure: ESOPHAGOGASTRODUODENOSCOPY (EGD) WITH PROPOFOL;  Surgeon: Virgel Manifold, MD;  Location: [facility] ENDOSCOPY;  Service: Endoscopy;  Laterality: N/A;  . GIVENS CAPSULE STUDY N/A 08/16/2018   Procedure: GIVENS CAPSULE STUDY;  Surgeon: Virgel Manifold, MD;  Location: [facility] ENDOSCOPY;  Service: Endoscopy;  Laterality: N/A;  . pace maker defib  2016  . PERIPHERAL VASCULAR CATHETERIZATION N/A 10/23/2016   Procedure: Renal Angiography;  Surgeon: Algernon Huxley, MD;  Location: [facility];  Service: Cardiovascular;  Laterality: N/A;  . TOTAL ABDOMINAL HYSTERECTOMY  1999   history of abnormal pap    Medications Prior to Admission  Medication Sig Dispense Refill Last Dose  . albuterol (VENTOLIN HFA) 108 (90 Base) MCG/ACT inhaler INHALE 2 PUFFS BY MOUTH INTO THE LUNGS EVERY 6 HOURS AS NEEDED FOR WHEEZING 18 g  2 06/18/2019 at PRN  . ALPRAZolam (XANAX) 0.5 MG tablet Take 0.5-1 tablets (0.25-0.5 mg total) by mouth at bedtime. TAKE ONE TABLET BY MOUTH AT BEDTIME AS NEEDED FOR ANXIETY 30 tablet 3 06/18/2019 at Unknown time  . aspirin 81 MG tablet Take 81 mg by mouth daily.   06/18/2019 at Unknown time  . atorvastatin (LIPITOR) 40 MG tablet Take 1  tablet (40 mg total) by mouth daily. 90 tablet 1 06/18/2019 at Unknown time  . carvedilol (COREG) 6.25 MG tablet TAKE ONE TABLET BY MOUTH TWICE DAILY WITH MEAL 180 tablet 1 06/18/2019 at Unknown time  . clopidogrel (PLAVIX) 75 MG tablet Take 1 tablet (75 mg total) by mouth daily. 90 tablet 1 06/18/2019 at Unknown time  . cyanocobalamin (,VITAMIN B-12,) 1000 MCG/ML injection Inject 1 mL weekly into the muscle monthly 10 mL 0 Past Month at Unknown time  . escitalopram (LEXAPRO) 10 MG tablet Take 1 tablet (10 mg total) by mouth daily. 90 tablet 1 06/18/2019 at Unknown time  . ferrous sulfate 325 (65 FE) MG EC tablet Take 325 mg by mouth daily with breakfast.   06/18/2019 at Unknown time  . fluticasone furoate-vilanterol (BREO ELLIPTA) 100-25 MCG/INH AEPB Inhale 1 puff into the lungs daily. 90 each 0 06/18/2019 at Unknown time  . Fluticasone-Salmeterol (ADVAIR DISKUS) 250-50 MCG/DOSE AEPB INHALE 1 PUFF BY MOUTH INTO THE LUNGS EVERY 12 HOURS RINSE MOUTH AFTER EACH USE 180 each 1 06/18/2019 at Unknown time  . furosemide (LASIX) 40 MG tablet Take 1 tablet (40 mg total) by mouth daily. 2 for weight gain > 2 lbs overnight (Patient taking differently: Take 40 mg by mouth daily. ) 135 tablet 1 06/18/2019 at Unknown time  . ipratropium-albuterol (DUONEB) 0.5-2.5 (3) MG/3ML SOLN Take 3 mLs by nebulization every 6 (six) hours as needed. 360 mL 1 06/18/2019 at Unknown time  . omeprazole (PRILOSEC) 40 MG capsule Take 1 capsule (40 mg total) by mouth daily. 90 capsule 1 06/18/2019 at Unknown time  . potassium chloride (K-DUR) 10 MEQ tablet Take 1 tablet (10 mEq total) by mouth daily. 90 tablet 1 06/18/2019 at Unknown time  . sacubitril-valsartan (ENTRESTO) 24-26 MG Take 1 tablet by mouth 2 (two) times daily. 180 tablet 3 06/18/2019 at Unknown time  . traZODone (DESYREL) 50 MG tablet Take 0.5-1 tablets (25-50 mg total) by mouth daily. One hour before bedtime 90 tablet 3 06/18/2019 at Unknown time  . acetaminophen (TYLENOL) 500 MG  tablet Maximum 6 tablets a day. (Patient taking differently: Take 500 mg by mouth every 4 (four) hours as needed for fever. Maximum 6 tablets a day.) 42 tablet 0 PRN at PRN  . diphenhydrAMINE (DIPHENHIST) 25 mg capsule Take 25 mg by mouth at bedtime as needed.    PRN at PRN  . nitroGLYCERIN (NITROSTAT) 0.4 MG SL tablet Place 1 tablet (0.4 mg total) under the tongue every 5 (five) minutes as needed for chest pain. 30 tablet 3 PRN at PRN  . predniSONE (DELTASONE) 10 MG tablet 6 tablets on Day 1 , then reduce by 1 tablet daily until gone (Patient not taking: Reported on 06/17/2019) 21 tablet 0 Not Taking at Unknown time  . Tiotropium Bromide Monohydrate (SPIRIVA RESPIMAT) 2.5 MCG/ACT AERS Inhale 5 mcg into the lungs every morning. (Patient not taking: Reported on 06/17/2019) 4 g 11 Not Taking at Unknown time   Social History   Socioeconomic History  . Marital status: Widowed    Spouse name: Not on file  . Number  of children: 0  . Years of education: 65  . Highest education level: 11th grade  Occupational History  . Occupation: disabled  Social Needs  . Financial resource strain: Not hard at all  . Food insecurity    Worry: Never true    Inability: Never true  . Transportation needs    Medical: No    Non-medical: No  Tobacco Use  . Smoking status: Current Every Day Smoker    Packs/day: 0.25    Types: Cigarettes  . Smokeless tobacco: Never Used  . Tobacco comment: reports smoking 2-3 cigarettes/ day  Substance and Sexual Activity  . Alcohol use: Yes    Alcohol/week: 2.0 standard drinks    Types: 2 Shots of liquor per week  . Drug use: No  . Sexual activity: Yes    Comment: 1 partner  Lifestyle  . Physical activity    Days per week: 0 days    Minutes per session: Not on file  . Stress: Only a little  Relationships  . Social Herbalist on phone: Once a week    Gets together: Once a week    Attends religious service: Never    Active member of club or organization: No     Attends meetings of clubs or organizations: Never    Relationship status: Living with partner  . Intimate partner violence    Fear of current or ex partner: No    Emotionally abused: No    Physically abused: No    Forced sexual activity: No  Other Topics Concern  . Not on file  Social History Narrative  . Not on file    Family History  Problem Relation Age of Onset  . Arthritis Mother   . Cancer Mother        uterus cancer  . Hyperlipidemia Mother   . Hypertension Mother   . Heart disease Mother   . Diabetes Mother   . Hyperlipidemia Father   . Hypertension Father   . Heart disease Father   . Diabetes Father   . Cancer Sister        ovary cancer  . Diabetes Maternal Grandmother   . Hypertension Maternal Grandmother   . Arthritis Maternal Grandmother   . Hypertension Maternal Grandfather   . Hypertension Paternal Grandmother   . Hypertension Paternal Grandfather   . Heart disease Paternal Grandfather   . Heart disease Brother   . Kidney disease Brother   . Breast cancer Neg Hx       Review of systems complete and found to be negative unless listed above      PHYSICAL EXAM  General: Well developed, well nourished, in no acute distress HEENT:  Normocephalic and atramatic Neck:  No JVD.  Lungs: Clear bilaterally to auscultation and percussion. Heart: HRRR . Normal S1 and S2 without gallops or murmurs.  Abdomen: Bowel sounds are positive, abdomen soft and non-tender  Msk:  Back normal, normal gait. Normal strength and tone for age. Extremities: No clubbing, cyanosis or edema.   Neuro: Alert and oriented X 3. Psych:  Good affect, responds appropriately  Labs:   Lab Results  Component Value Date   WBC 18.3 (H) 06/19/2019   HGB 13.5 06/19/2019   HCT 40.7 06/19/2019   MCV 105.7 (H) 06/19/2019   PLT 416 (H) 06/19/2019    Recent Labs  Lab 06/19/19 0817  NA 137  K 3.9  CL 96*  CO2 18*  BUN 15  CREATININE 1.28*  CALCIUM 8.6*  PROT 7.2  BILITOT 1.2   ALKPHOS 86  ALT 17  AST 33  GLUCOSE 325*   Lab Results  Component Value Date   CKTOTAL 44 05/30/2013   CKMB 33.2 (H) 08/19/2014   TROPONINI <0.03 03/16/2019    Lab Results  Component Value Date   CHOL 148 11/08/2018   CHOL 222 (H) 05/06/2018   CHOL 229 (H) 06/06/2016   Lab Results  Component Value Date   HDL 39.60 11/08/2018   HDL 41 (L) 05/06/2018   HDL 35.30 (L) 06/06/2016   Lab Results  Component Value Date   LDLCALC 139 (H) 05/06/2018   LDLCALC 90 10/09/2014   LDLCALC 107 (H) 05/30/2013   Lab Results  Component Value Date   TRIG (H) 11/08/2018    550.0 Triglyceride is over 400; calculations on Lipids are invalid.   TRIG 276 (H) 05/06/2018   TRIG (H) 06/06/2016    477.0 Triglyceride is over 400; calculations on Lipids are invalid.   Lab Results  Component Value Date   CHOLHDL 4 11/08/2018   CHOLHDL 5.4 (H) 05/06/2018   CHOLHDL 6 06/06/2016   Lab Results  Component Value Date   LDLDIRECT 62.0 11/08/2018   LDLDIRECT 155.0 06/06/2016   LDLDIRECT 128 (H) 10/09/2014      Radiology: Dg Chest 1 View  Result Date: 05/29/2019 CLINICAL DATA:  Fever. EXAM: CHEST  1 VIEW COMPARISON:  05/29/2019 FINDINGS: The patient has RIGHT-sided transvenous pacemaker leads to the RIGHT atrium, RIGHT ventricle, and coronary sinus. Status post median sternotomy. Heart is enlarged. There are prominent interstitial markings consistent with interstitial pulmonary edema. No overt alveolar edema. No consolidations or pleural effusions. Remote ORIF of the LEFT clavicle. IMPRESSION: Cardiomegaly and interstitial pulmonary edema. Electronically Signed   By: Nolon Nations M.D.   On: 05/29/2019 10:07   Dg Chest 2 View  Result Date: 05/28/2019 CLINICAL DATA:  Hypoxia. EXAM: CHEST - 2 VIEW COMPARISON:  05/27/2019, 03/16/2019 and 11/08/2018 FINDINGS: Chronic cardiomegaly. CABG. AICD in place. Aortic atherosclerosis. There is slight diffuse accentuation of the interstitial markings with tiny  bilateral pleural effusions. The pulmonary vascularity is now within normal limits. The pulmonary edema and effusions have improved since the prior study. Vascular congestion has resolved IMPRESSION: 1. Improving pulmonary edema and effusions. 2. Aortic atherosclerosis. Electronically Signed   By: Lorriane Shire M.D.   On: 05/28/2019 13:53   Ct Angio Chest W/cm &/or Wo Cm  Result Date: 06/09/2019 CLINICAL DATA:  Shortness of breath with exertion and hemoptysis. Smoker. History of coronary artery disease and congestive heart failure. EXAM: CT ANGIOGRAPHY CHEST WITH CONTRAST TECHNIQUE: Multidetector CT imaging of the chest was performed using the standard protocol during bolus administration of intravenous contrast. Multiplanar CT image reconstructions and MIPs were obtained to evaluate the vascular anatomy. CONTRAST:  9mL ISOVUE-370 IOPAMIDOL (ISOVUE-370) INJECTION 76% COMPARISON:  Chest CT 10/26/2014.  Radiographs 05/29/2019. FINDINGS: Cardiovascular: The pulmonary arteries are well opacified with contrast to the level of the subsegmental branches. There is no evidence of acute pulmonary embolism. There is moderate atherosclerosis of the aorta, great vessels and coronary arteries status post median sternotomy and CABG. There is limited opacification of the systemic arteries. The heart is mildly enlarged. Patient has a pacemaker. No significant pericardial effusion. Mediastinum/Nodes: There are no enlarged mediastinal, hilar or axillary lymph nodes.Small mediastinal lymph nodes are stable. The thyroid gland, trachea and esophagus demonstrate no significant findings. Lungs/Pleura: There is no pleural effusion or pneumothorax. There is underlying mild centrilobular  emphysema with increased ill-defined peribronchovascular nodularity in the upper lobes. Scarring in the lingula is stable. There is no confluent airspace opacity. Upper abdomen: Hepatic low density consistent with steatosis. There is mild reflux of  contrast into the IVC and hepatic veins. Severe aortic and branch vessel atherosclerosis in the upper abdomen with a right renal artery stent. Chronic left renal cortical thinning. Musculoskeletal/Chest wall: There is no chest wall mass or suspicious osseous finding. Review of the MIP images confirms the above findings. IMPRESSION: 1. No evidence of acute pulmonary embolism or other acute vascular findings in the chest. 2. Coronary and Aortic Atherosclerosis (ICD10-I70.0) post CABG. 3. Poorly defined centrilobular nodules in both lungs, superimposed on emphysema, suggesting possible respiratory bronchiolitis-interstitial lung disease in this smoker. No confluent airspace opacity. Electronically Signed   By: Richardean Sale M.D.   On: 06/09/2019 16:03   Dg Chest Portable 1 View  Result Date: 06/19/2019 CLINICAL DATA:  Evaluate for pneumonia. EXAM: PORTABLE CHEST 1 VIEW COMPARISON:  May 29, 2019 FINDINGS: Postsurgical changes from CABG. Stable appearance of cardiac pacemaker leads. Enlarged cardiac silhouette. Diffuse bilateral interstitial and alveolar opacities. No evidence of pleural effusion or pneumothorax. Osseous structures are without acute abnormality. Soft tissues are grossly normal. IMPRESSION: Diffuse bilateral interstitial and alveolar opacities which may represent mixed pattern pulmonary edema or less likely atypical multifocal pneumonia. Electronically Signed   By: Fidela Salisbury M.D.   On: 06/19/2019 08:44   Dg Chest Port 1 View  Result Date: 05/29/2019 CLINICAL DATA:  Patient admitted 05/27/2019 with respiratory distress. EXAM: PORTABLE CHEST 1 VIEW COMPARISON:  PA and lateral chest 05/28/2019 and 11/08/2018. Single-view of the chest 05/27/2019. FINDINGS: The patient is status post CABG with a pacing device in place. Cardiomegaly and atherosclerosis again seen. Pulmonary edema appears slightly worse than on the most recent comparison. Trace pleural effusions noted. No pneumothorax. No  acute bony abnormality. IMPRESSION: Mildly increased pulmonary edema since the most recent exam. Cardiomegaly. Trace pleural effusions. Atherosclerosis. Electronically Signed   By: Inge Rise M.D.   On: 05/29/2019 07:39   Dg Chest Portable 1 View  Result Date: 05/27/2019 CLINICAL DATA:  Shortness of breath EXAM: PORTABLE CHEST 1 VIEW COMPARISON:  03/16/2019 FINDINGS: cardiomegaly and CABG. Biventricular pacer leads from the right in stable position. Diffuse interstitial opacity, vascular pedicle widening, and small pleural effusions. IMPRESSION: CHF. Electronically Signed   By: Monte Fantasia M.D.   On: 05/27/2019 04:51    EKG: Sinus tachycardia  ASSESSMENT AND PLAN:   1.  Respiratory failure, likely multifactorial secondary to COPD exacerbation and congestive heart failure 2.  Acute on chronic systolic congestive heart failure, improved after initial diuresis 3.  CAD, status post CABG, without chest pain, with borderline elevated high-sensitivity troponin, consistent with demand supply ischemia  Recommendations  1.  Agree with current therapy 2.  Continue diuresis 3.  Carefully monitor renal status 4.  Defer full dose anticoagulation 5.  Defer cardiac catheterization 6.  Further recommendations pending patient's initial clinical course  Signed: Isaias Cowman MD,PhD, [facility] 06/19/2019, 4:24 PM

## 2019-06-19 NOTE — ED Notes (Signed)
Per ICU NP, sliding scale insulin given to patient based on new orders from diabetes coordinator.

## 2019-06-19 NOTE — H&P (Signed)
[facility] NAME: Ashley [name]    MR#:  [id number]  DATE OF BIRTH:  02-22-1951  DATE OF ADMISSION:  06/19/2019  PRIMARY CARE PHYSICIAN: [name], MD   REQUESTING/REFERRING PHYSICIAN: Dr. Merlyn Lot  CHIEF COMPLAINT:   Chief Complaint  Patient presents with  . Respiratory Distress    HISTORY OF PRESENT ILLNESS:  Ashley [name]  is a 68 y.o. female with a known history of hypertension, hyperlipidemia, history of coronary artery bypass graft surgery, coronary artery disease, peripheral vascular disease, chronic systolic CHF who presents to the hospital due to shortness of breath.  Patient was in her usual state of health and this morning was going to see her primary care physician for routine blood work but on the drive developed sudden onset significant shortness of breath and therefore pulled over to the side of the road.  She called EMS and they brought her to the hospital for further evaluation.  Patient was significantly hypoxic when arriving to the ER and noted to be in acute respiratory failure with hypoxia secondary to suspected CHF/COPD and placed on BiPAP.  Patient also given IV diuretics and empiric IV antibiotics.  Given her severe respiratory distress and hypoxemia hospital services were contacted for admission.  Patient denies any significant lower extremity edema or weight gain, she denies paroxysmal nocturnal dyspnea orthopnea, denies any fever, cough admits to some nausea but no vomiting.  PAST MEDICAL HISTORY:   Past Medical History:  Diagnosis Date  . AICD (automatic cardioverter/defibrillator) present    on right side  . Anemia   . CAD (coronary artery disease)    s/p CABG  . CHF (congestive heart failure) ([place])   . Chronic kidney disease    renal artery stenosis  . Colon cancer ([place])   . Depression   . GERD (gastroesophageal reflux disease)   . Hx of colonic polyps   . Hyperlipidemia   .  Hypertension   . Mitral valve disorder    s/p mitral valve repair wth CABG  . Myocardial infarction ([place])   . Peripheral vascular disease ([place])   . Presence of permanent cardiac pacemaker    Pacemaker/ Defibrillator    PAST SURGICAL HISTORY:   Past Surgical History:  Procedure Laterality Date  . arm surgery     fracture, has plates and screws  . CABG with mitral valve repair    . CARDIAC CATHETERIZATION    . COLON SURGERY     colon cancer  . COLONOSCOPY WITH PROPOFOL N/A 10/09/2016   Procedure: COLONOSCOPY WITH PROPOFOL;  Surgeon: Jonathon Bellows, MD;  Location: [facility] ENDOSCOPY;  Service: Endoscopy;  Laterality: N/A;  . COLONOSCOPY WITH PROPOFOL N/A 07/24/2018   Procedure: COLONOSCOPY WITH PROPOFOL;  Surgeon: Virgel Manifold, MD;  Location: [facility] ENDOSCOPY;  Service: Endoscopy;  Laterality: N/A;  . CORONARY ARTERY BYPASS GRAFT    . ENTEROSCOPY N/A 11/21/2018   Procedure: ENTEROSCOPY-BALLOON;  Surgeon: Jonathon Bellows, MD;  Location: [facility] ENDOSCOPY;  Service: Gastroenterology;  Laterality: N/A;  . ESOPHAGOGASTRODUODENOSCOPY (EGD) WITH PROPOFOL N/A 07/24/2018   Procedure: ESOPHAGOGASTRODUODENOSCOPY (EGD) WITH PROPOFOL;  Surgeon: Virgel Manifold, MD;  Location: [facility] ENDOSCOPY;  Service: Endoscopy;  Laterality: N/A;  . GIVENS CAPSULE STUDY N/A 08/16/2018   Procedure: GIVENS CAPSULE STUDY;  Surgeon: Virgel Manifold, MD;  Location: [facility] ENDOSCOPY;  Service: Endoscopy;  Laterality: N/A;  . pace maker defib  2016  . PERIPHERAL VASCULAR CATHETERIZATION N/A 10/23/2016   Procedure:  Renal Angiography;  Surgeon: Algernon Huxley, MD;  Location: [place] [facility] CV LAB;  Service: Cardiovascular;  Laterality: N/A;  . TOTAL ABDOMINAL HYSTERECTOMY  1999   history of abnormal pap    SOCIAL HISTORY:   Social History   Tobacco Use  . Smoking status: Current Every Day Smoker    Packs/day: 0.25    Types: Cigarettes  . Smokeless tobacco: Never Used  . Tobacco comment: reports smoking 2-3 cigarettes/  day  Substance Use Topics  . Alcohol use: Yes    Alcohol/week: 2.0 standard drinks    Types: 2 Shots of liquor per week    FAMILY HISTORY:   Family History  Problem Relation Age of Onset  . Arthritis Mother   . Cancer Mother        uterus cancer  . Hyperlipidemia Mother   . Hypertension Mother   . Heart disease Mother   . Diabetes Mother   . Hyperlipidemia Father   . Hypertension Father   . Heart disease Father   . Diabetes Father   . Cancer Sister        ovary cancer  . Diabetes Maternal Grandmother   . Hypertension Maternal Grandmother   . Arthritis Maternal Grandmother   . Hypertension Maternal Grandfather   . Hypertension Paternal Grandmother   . Hypertension Paternal Grandfather   . Heart disease Paternal Grandfather   . Heart disease Brother   . Kidney disease Brother   . Breast cancer Neg Hx     DRUG ALLERGIES:   Allergies  Allergen Reactions  . Hydrocodone-Acetaminophen Nausea Only    REVIEW OF SYSTEMS:   Review of Systems  Constitutional: Negative for fever and weight loss.  HENT: Negative for congestion, nosebleeds and tinnitus.   Eyes: Negative for blurred vision, double vision and redness.  Respiratory: Positive for shortness of breath. Negative for cough and hemoptysis.   Cardiovascular: Negative for chest pain, orthopnea, leg swelling and PND.  Gastrointestinal: Negative for abdominal pain, diarrhea, melena, nausea and vomiting.  Genitourinary: Negative for dysuria, hematuria and urgency.  Musculoskeletal: Negative for falls and joint pain.  Neurological: Positive for weakness (Generalized). Negative for dizziness, tingling, sensory change, focal weakness, seizures and headaches.  Endo/Heme/Allergies: Negative for polydipsia. Does not bruise/bleed easily.  Psychiatric/Behavioral: Negative for depression and memory loss. The patient is not nervous/anxious.     MEDICATIONS AT HOME:   Prior to Admission medications   Medication Sig Start Date  End Date Taking? Authorizing Provider  albuterol (VENTOLIN HFA) 108 (90 Base) MCG/ACT inhaler INHALE 2 PUFFS BY MOUTH INTO THE LUNGS EVERY 6 HOURS AS NEEDED FOR WHEEZING 06/03/19  Yes [name], MD  ALPRAZolam Duanne Moron) 0.5 MG tablet Take 0.5-1 tablets (0.25-0.5 mg total) by mouth at bedtime. TAKE ONE TABLET BY MOUTH AT BEDTIME AS NEEDED FOR ANXIETY 02/24/19  Yes [name], MD  aspirin 81 MG tablet Take 81 mg by mouth daily.   Yes [provider]  atorvastatin (LIPITOR) 40 MG tablet Take 1 tablet (40 mg total) by mouth daily. 06/03/19  Yes [name], MD  carvedilol (COREG) 6.25 MG tablet TAKE ONE TABLET BY MOUTH TWICE DAILY WITH MEAL 06/03/19  Yes [name], MD  clopidogrel (PLAVIX) 75 MG tablet Take 1 tablet (75 mg total) by mouth daily. 06/03/19  Yes [name], MD  cyanocobalamin (,VITAMIN B-12,) 1000 MCG/ML injection Inject 1 mL weekly into the muscle monthly 11/08/18  Yes [name], MD  escitalopram (LEXAPRO) 10 MG  tablet Take 1 tablet (10 mg total) by mouth daily. 06/03/19  Yes [name], MD  ferrous sulfate 325 (65 FE) MG EC tablet Take 325 mg by mouth daily with breakfast.   Yes [provider]  fluticasone furoate-vilanterol (BREO ELLIPTA) 100-25 MCG/INH AEPB Inhale 1 puff into the lungs daily. 06/04/19 07/04/19 Yes [name], MD  Fluticasone-Salmeterol (ADVAIR DISKUS) 250-50 MCG/DOSE AEPB INHALE 1 PUFF BY MOUTH INTO THE LUNGS EVERY 12 HOURS RINSE MOUTH AFTER EACH USE 06/03/19  Yes [name], MD  furosemide (LASIX) 40 MG tablet Take 1 tablet (40 mg total) by mouth daily. 2 for weight gain > 2 lbs overnight Patient taking differently: Take 40 mg by mouth daily.  06/03/19  Yes [name], MD  ipratropium-albuterol (DUONEB) 0.5-2.5 (3) MG/3ML SOLN Take 3 mLs by nebulization every 6 (six) hours as needed. 08/05/18  Yes [name], MD  omeprazole (PRILOSEC) 40 MG capsule Take 1 capsule (40 mg total) by mouth daily. 06/03/19  Yes  [name], MD  potassium chloride (K-DUR) 10 MEQ tablet Take 1 tablet (10 mEq total) by mouth daily. 06/03/19  Yes [name], MD  sacubitril-valsartan (ENTRESTO) 24-26 MG Take 1 tablet by mouth 2 (two) times daily. 06/03/19  Yes [name], MD  traZODone (DESYREL) 50 MG tablet Take 0.5-1 tablets (25-50 mg total) by mouth daily. One hour before bedtime 06/03/19  Yes [name], MD  acetaminophen (TYLENOL) 500 MG tablet Maximum 6 tablets a day. Patient taking differently: Take 500 mg by mouth every 4 (four) hours as needed for fever. Maximum 6 tablets a day. 02/09/15   Rubbie Battiest, RN  diphenhydrAMINE (DIPHENHIST) 25 mg capsule Take 25 mg by mouth at bedtime as needed.     [provider]  nitroGLYCERIN (NITROSTAT) 0.4 MG SL tablet Place 1 tablet (0.4 mg total) under the tongue every 5 (five) minutes as needed for chest pain. 06/19/16   [name], MD  predniSONE (DELTASONE) 10 MG tablet 6 tablets on Day 1 , then reduce by 1 tablet daily until gone Patient not taking: Reported on 06/17/2019 06/10/19   [name], MD  Tiotropium Bromide Monohydrate (SPIRIVA RESPIMAT) 2.5 MCG/ACT AERS Inhale 5 mcg into the lungs every morning. Patient not taking: Reported on 06/17/2019 06/03/19   [name], MD      VITAL SIGNS:  Blood pressure 121/75, pulse (!) 130, temperature (!) 96.6 F (35.9 C), temperature source Axillary, resp. rate (!) 35, height 5\' 4"  (1.626 m), weight 74.5 kg, SpO2 97 %.  PHYSICAL EXAMINATION:  Physical Exam  GENERAL:  68 y.o.-year-old patient lying in the bed in mild respiratory distress on BiPAP. EYES: Pupils equal, round, reactive to light and accommodation. No scleral icterus. Extraocular muscles intact.  HEENT: Head atraumatic, normocephalic. Oropharynx and nasopharynx clear. No oropharyngeal erythema, moist oral mucosa  NECK:  Supple, no jugular venous distention. No thyroid enlargement, no tenderness.  LUNGS: Good air entry bilaterally,  diffuse end expiratory wheezing, minimal Rales bilaterally, no rhonchi.  Positive use of accessory muscles. CARDIOVASCULAR: S1, S2 RRR. No murmurs, rubs, gallops, clicks.  ABDOMEN: Soft, nontender, nondistended. Bowel sounds present. No organomegaly or mass.  EXTREMITIES: No pedal edema, cyanosis, or clubbing. + 2 pedal & radial pulses b/l.   NEUROLOGIC: Cranial nerves II through XII are intact. No focal Motor or sensory deficits appreciated b/l.  Globally weak PSYCHIATRIC: The patient is alert and oriented x 3. SKIN: No obvious  rash, lesion, or ulcer.   LABORATORY PANEL:   CBC Recent Labs  Lab 06/19/19 0817  WBC 18.3*  HGB 13.5  HCT 40.7  PLT 416*   ------------------------------------------------------------------------------------------------------------------  Chemistries  Recent Labs  Lab 06/19/19 0817  NA 137  K 3.9  CL 96*  CO2 18*  GLUCOSE 325*  BUN 15  CREATININE 1.28*  CALCIUM 8.6*  AST 33  ALT 17  ALKPHOS 86  BILITOT 1.2   ------------------------------------------------------------------------------------------------------------------  Cardiac Enzymes No results for input(s): TROPONINI in the last 168 hours. ------------------------------------------------------------------------------------------------------------------  RADIOLOGY:  Dg Chest Portable 1 View  Result Date: 06/19/2019 CLINICAL DATA:  Evaluate for pneumonia. EXAM: PORTABLE CHEST 1 VIEW COMPARISON:  May 29, 2019 FINDINGS: Postsurgical changes from CABG. Stable appearance of cardiac pacemaker leads. Enlarged cardiac silhouette. Diffuse bilateral interstitial and alveolar opacities. No evidence of pleural effusion or pneumothorax. Osseous structures are without acute abnormality. Soft tissues are grossly normal. IMPRESSION: Diffuse bilateral interstitial and alveolar opacities which may represent mixed pattern pulmonary edema or less likely atypical multifocal pneumonia. Electronically Signed    By: Fidela Salisbury M.D.   On: 06/19/2019 08:44     IMPRESSION AND PLAN:   68 y.o. female with a known history of hypertension, hyperlipidemia, history of coronary artery bypass graft surgery, coronary artery disease, peripheral vascular disease, chronic systolic CHF who presents to the hospital due to shortness of breath.  1.  Acute respiratory failure with hypoxia- etiology unclear but suspected to be secondary to combination of COPD exacerbation and mild CHF. -Continue BiPAP support.  We will diurese the patient with IV Lasix, follow I's and O's. - Also treat underlying COPD with IV steroids scheduled duo nebs, Pulmicort nebs.  Wean off BiPAP and oxygen as tolerated.  2.  COPD exacerbation- patient has ongoing tobacco abuse, chest x-ray showing possible pulmonary edema/atypical pneumonia.  We will treat the patient with IV steroids, scheduled duo nebs, Pulmicort nebs. - Patient received 1 dose of IV antibiotics in the ER but will hold off on further antibiotics and await procalcitonin level for now.  3.  Suspected pneumonia- patient received 1 dose of IV vancomycin and cefepime in the ER. -We will hold off on further antibiotics presently.  Await procalcitonin level.  If elevated would start the patient on broad-spectrum antibiotics.  4.  CHF-acute on chronic systolic dysfunction.  Patient has a previous cardiomyopathy EF of 45%. -We will diurese the patient with IV Lasix, follow I's and O's and daily weights. -Continue carvedilol, Entresto.  5.  Hyperlipidemia-continue atorvastatin.  6.  Depression-continue Lexapro.  7.  History of coronary disease status post bypass-no acute chest pain presently. -Continue aspirin, Plavix, atorvastatin, carvedilol.  All the records are reviewed and case discussed with ED provider. Management plans discussed with the patient, family and they are in agreement.  CODE STATUS: Full code  TOTAL Critical Care TIME TAKING CARE OF THIS PATIENT: 45  minutes.    Henreitta Leber M.D on 06/19/2019 at 11:26 AM  Between 7am to 6pm - Pager - [phone]  After 6pm go to [email or web address] - password [email or web address] [facility]  Office  [phone]  CC: Primary care physician; [name], MD

## 2019-06-19 NOTE — Telephone Encounter (Signed)
Pt canceled appt for today due to being in the ER.

## 2019-06-20 ENCOUNTER — Telehealth: Payer: Self-pay

## 2019-06-20 DIAGNOSIS — J810 Acute pulmonary edema: Secondary | ICD-10-CM

## 2019-06-20 LAB — BASIC METABOLIC PANEL
Anion gap: 15 (ref 5–15)
BUN: 18 mg/dL (ref 8–23)
CO2: 27 mmol/L (ref 22–32)
Calcium: 8.5 mg/dL — ABNORMAL LOW (ref 8.9–10.3)
Chloride: 96 mmol/L — ABNORMAL LOW (ref 98–111)
Creatinine, Ser: 1.1 mg/dL — ABNORMAL HIGH (ref 0.44–1.00)
GFR calc Af Amer: >60 mL/min (ref >60)
GFR calc non Af Amer: 52 mL/min — ABNORMAL LOW (ref >60)
Glucose, Bld: 185 mg/dL — ABNORMAL HIGH (ref 70–99)
Potassium: 3.2 mmol/L — ABNORMAL LOW (ref 3.5–5.1)
Sodium: 138 mmol/L (ref 135–145)

## 2019-06-20 LAB — CBC
HCT: 32.6 % — ABNORMAL LOW (ref 36.0–46.0)
Hemoglobin: 11.2 g/dL — ABNORMAL LOW (ref 12.0–15.0)
MCH: 34.5 pg — ABNORMAL HIGH (ref 26.0–34.0)
MCHC: 34.4 g/dL (ref 30.0–36.0)
MCV: 100.3 fL — ABNORMAL HIGH (ref 80.0–100.0)
Platelets: 234 10*3/uL (ref 150–400)
RBC: 3.25 MIL/uL — ABNORMAL LOW (ref 3.87–5.11)
RDW: 14.6 % (ref 11.5–15.5)
WBC: 11.8 10*3/uL — ABNORMAL HIGH (ref 4.0–10.5)
nRBC: 0 % (ref 0.0–0.2)

## 2019-06-20 LAB — GLUCOSE, CAPILLARY
Glucose-Capillary: 207 mg/dL — ABNORMAL HIGH (ref 70–99)
Glucose-Capillary: 185 mg/dL — ABNORMAL HIGH (ref 70–99)
Glucose-Capillary: 194 mg/dL — ABNORMAL HIGH (ref 70–99)
Glucose-Capillary: 134 mg/dL — ABNORMAL HIGH (ref 70–99)

## 2019-06-20 MED ORDER — POTASSIUM CHLORIDE CRYS ER 20 MEQ PO TBCR
30.0000 meq | EXTENDED_RELEASE_TABLET | Freq: Once | ORAL | Status: AC
  Administered 2019-06-20: 09:00:00 30 meq via ORAL
  Filled 2019-06-20: qty 2

## 2019-06-20 MED ORDER — PNEUMOCOCCAL VAC POLYVALENT 25 MCG/0.5ML IJ INJ
0.5000 mL | INJECTION | INTRAMUSCULAR | Status: AC
  Administered 2019-06-21: 0.5 mL via INTRAMUSCULAR
  Filled 2019-06-20: qty 0.5

## 2019-06-20 MED ORDER — CHLORHEXIDINE GLUCONATE CLOTH 2 % EX PADS
6.0000 | MEDICATED_PAD | Freq: Every day | CUTANEOUS | Status: DC
  Administered 2019-06-20: 6 via TOPICAL

## 2019-06-20 MED ORDER — POTASSIUM CHLORIDE CRYS ER 20 MEQ PO TBCR
40.0000 meq | EXTENDED_RELEASE_TABLET | Freq: Once | ORAL | Status: AC
  Administered 2019-06-20: 40 meq via ORAL
  Filled 2019-06-20: qty 2

## 2019-06-20 MED ORDER — FLUTICASONE FUROATE-VILANTEROL 200-25 MCG/INH IN AEPB
1.0000 | INHALATION_SPRAY | Freq: Every day | RESPIRATORY_TRACT | Status: DC
  Administered 2019-06-20 – 2019-06-21 (×2): 1 via RESPIRATORY_TRACT
  Filled 2019-06-20: qty 28

## 2019-06-20 MED ORDER — POTASSIUM CHLORIDE CRYS ER 20 MEQ PO TBCR: 40.0000 meq | EXTENDED_RELEASE_TABLET | Freq: Two times a day (BID) | ORAL | Status: DC

## 2019-06-20 MED ORDER — IPRATROPIUM-ALBUTEROL 0.5-2.5 (3) MG/3ML IN SOLN: 3.0000 mL | Freq: Four times a day (QID) | RESPIRATORY_TRACT | Status: DC | PRN

## 2019-06-20 NOTE — Progress Notes (Signed)
[facility] Cardiology  SUBJECTIVE: Patient sitting up in bed, reports feeling much better, denies chest pain   Vitals:   06/20/19 0400 06/20/19 0500 06/20/19 0600 06/20/19 0744  BP: (!) 106/59 106/63    Pulse: 88 93 92   Resp: 18 16 (!) 21   Temp:      TempSrc:      SpO2: 92% 93% 95% 96%  Weight:      Height:         Intake/Output Summary (Last 24 hours) at 06/20/2019 0831 Last data filed at 06/20/2019 0200 Gross per 24 hour  Intake 1440 ml  Output 800 ml  Net 640 ml      PHYSICAL EXAM  General: Well developed, well nourished, in no acute distress HEENT:  Normocephalic and atramatic Neck:  No JVD.  Lungs: Decreased breath sounds bilaterally Heart: HRRR . Normal S1 and S2 without gallops or murmurs.  Abdomen: Bowel sounds are positive, abdomen soft and non-tender  Msk:  Back normal, normal gait. Normal strength and tone for age. Extremities: No clubbing, cyanosis or edema.   Neuro: Alert and oriented X 3. Psych:  Good affect, responds appropriately   LABS: Basic Metabolic Panel: Recent Labs    06/19/19 0817 06/20/19 0506  NA 137 138  K 3.9 3.2*  CL 96* 96*  CO2 18* 27  GLUCOSE 325* 185*  BUN 15 18  CREATININE 1.28* 1.10*  CALCIUM 8.6* 8.5*   Liver Function Tests: Recent Labs    06/19/19 0817  AST 33  ALT 17  ALKPHOS 86  BILITOT 1.2  PROT 7.2  ALBUMIN 3.9   No results for input(s): LIPASE, AMYLASE in the last 72 hours. CBC: Recent Labs    06/19/19 0817 06/20/19 0506  WBC 18.3* 11.8*  NEUTROABS 9.7*  --   HGB 13.5 11.2*  HCT 40.7 32.6*  MCV 105.7* 100.3*  PLT 416* 234   Cardiac Enzymes: No results for input(s): CKTOTAL, CKMB, CKMBINDEX, TROPONINI in the last 72 hours. BNP: Invalid input(s): POCBNP D-Dimer: No results for input(s): DDIMER in the last 72 hours. Hemoglobin A1C: No results for input(s): HGBA1C in the last 72 hours. Fasting Lipid Panel: No results for input(s): CHOL, HDL, LDLCALC, TRIG, CHOLHDL, LDLDIRECT in the last 72  hours. Thyroid Function Tests: No results for input(s): TSH, T4TOTAL, T3FREE, THYROIDAB in the last 72 hours.  Invalid input(s): FREET3 Anemia Panel: No results for input(s): VITAMINB12, FOLATE, FERRITIN, TIBC, IRON, RETICCTPCT in the last 72 hours.  Dg Chest Portable 1 View  Result Date: 06/19/2019 CLINICAL DATA:  Evaluate for pneumonia. EXAM: PORTABLE CHEST 1 VIEW COMPARISON:  May 29, 2019 FINDINGS: Postsurgical changes from CABG. Stable appearance of cardiac pacemaker leads. Enlarged cardiac silhouette. Diffuse bilateral interstitial and alveolar opacities. No evidence of pleural effusion or pneumothorax. Osseous structures are without acute abnormality. Soft tissues are grossly normal. IMPRESSION: Diffuse bilateral interstitial and alveolar opacities which may represent mixed pattern pulmonary edema or less likely atypical multifocal pneumonia. Electronically Signed   By: Fidela Salisbury M.D.   On: 06/19/2019 08:44     Echo pending  TELEMETRY: Paced:  ASSESSMENT AND PLAN:  Active Problems:   Acute respiratory failure with hypoxia (HCC)    1.  Respiratory failure, likely secondary to COPD exacerbation with underlying systolic congestive heart failure, improved, not requiring supplemental oxygen 2.  Acute on chronic systolic congestive heart failure, improved after initial diuresis 3.  CAD, status post CABG, without chest pain, borderline elevated high-sensitivity troponin, consistent with demand supply  ischemia, not due to acute coronary syndrome  Recommendations  1.  Agree with current therapy 2.  Continue diuresis 3.  Carefully monitor renal status 4.  Defer full dose anticoagulation 5.  Defer cardiac catheterization 6.  Review 2D echocardiogram 7.  Likely transfer to telemetry today 8.  Probable discharge tomorrow  Sign off for now, please call with any questions    Isaias Cowman, MD, PhD, [facility] 06/20/2019 8:31 AM

## 2019-06-20 NOTE — Progress Notes (Signed)
Name: Ashley [name] MRN: [id number] DOB: March 03, 1951     CONSULTATION DATE: 06/19/2019  REFERRING MD :  Dr. Verdell Carmine  CHIEF COMPLAINT: Shortness of Breath   HISTORY OF PRESENT ILLNESS:    68 y.o female with a past medical history of Systolic CHFwith EF 96% (7/59), CAD, hx of MI, AICD, Mitral Valve repair with CABG, PVD, HTN, HLD, Colon polyps and Colon Cancer, Depression, anemia, and CKD. On the way to see her PCP (8/13) for labs she had acute onset of SOB requiring her to pull over. She presented to the ED via EMS yesterday with a acute SOB requiring BiPAP.  Lab results revealed CO2 18, glucose 325, creatinine 1.28, anion gap 23, BNP 3,319, troponin 29, wbc 18.3, lactic acid 5.0, pct <0.10, and vbg pH 7.43/pCO2 42.  CXR concerning for possible pneumonia and pulmonary edema.  She received cefepime, 40 mg iv lasix, duoneb x3, solumedrol, and vancomycin. Pt also hypertensive sbp 150's to 190's requiring 5 mg iv labetalol x1 dose.  She was subsequently admitted to the stepdown unit by hospitalist team for additional workup and treatment.   OVERNIGHT EVENTS: No longer requiring supplemental oxygen, tolerating RA with SpO  SIGNIFICANT EVENTS: 8/13> Requiring BiPAP 8/14> No longer requiring supplemental oxygen   CULTURES: 8/13>SARS COVID19: negative 8/13>Blood culture: no growth 8/14  PAST MEDICAL HISTORY :   has a past medical history of AICD (automatic cardioverter/defibrillator) present, Anemia, CAD (coronary artery disease), CHF (congestive heart failure) ([place]), Chronic kidney disease, Colon cancer ([place]), Depression, GERD (gastroesophageal reflux disease), colonic polyps, Hyperlipidemia, Hypertension, Mitral valve disorder, Myocardial infarction [facility]), Peripheral vascular disease ([place]), and Presence of permanent cardiac pacemaker.  has a past surgical history that includes Total abdominal hysterectomy (1999); CABG with mitral valve repair; Colon surgery; arm surgery; pace maker defib  (2016); Coronary artery bypass graft; Colonoscopy with propofol (N/A, 10/09/2016); Cardiac catheterization (N/A, 10/23/2016); Colonoscopy with propofol (N/A, 07/24/2018); Esophagogastroduodenoscopy (egd) with propofol (N/A, 07/24/2018); Givens capsule study (N/A, 08/16/2018); Cardiac catheterization; and enteroscopy (N/A, 11/21/2018).   Allergies  Allergen Reactions  . Hydrocodone-Acetaminophen Nausea Only    REVIEW OF SYSTEMS:   Gen:  Denies  fever, sweats, chills weight loss  HEENT: Denies blurred vision, double vision, ear pain, eye pain, hearing loss, nose bleeds, sore throat Cardiac:  No dizziness, chest pain or heaviness, chest tightness,edema Resp:   cough, -sputum production, -shortness of breath, wheezing (chronic, unchanged), -hemoptysis Gi: Denies swallowing difficulty, stomach pain, nausea or vomiting, diarrhea, constipation, bowel incontinence Gu:  Denies bladder incontinence, burning urine Ext:   Denies Joint pain, stiffness or swelling Skin: Denies  skin rash, easy bruising or bleeding or hives Endoc:  Denies polyuria, polydipsia , polyphagia or weight change Psych:   Denies depression, insomnia or hallucinations  Other:  All other systems negative   VITAL SIGNS: Temp:  [97.5 F (36.4 C)-97.9 F (36.6 C)] 97.9 F (36.6 C) (08/14 0800) Pulse Rate:  [79-101] 84 (08/14 1100) Resp:  [14-28] 16 (08/14 1100) BP: (106-150)/(59-100) 117/78 (08/14 1100) SpO2:  [92 %-100 %] 97 % (08/14 1100)   I/O last 3 completed shifts: In: 1440 [P.O.:240; IV Piggyback:1200] Out: 800 [Urine:800] Total I/O In: -  Out: 800 [Urine:800]   SpO2: 97 % O2 Flow Rate (L/min): 2 L/min FiO2 (%): 40 %   Physical Examination:  Physical Exam  Constitutional: She is oriented to person, place, and time. She appears well-developed and well-nourished. No distress.  HENT:  Head: Normocephalic and atraumatic.  Eyes: Pupils are equal, round,  and reactive to light. No scleral icterus.  Neck: Neck  supple.  Cardiovascular: Normal rate, regular rhythm and normal heart sounds. Exam reveals no gallop and no friction rub.  No murmur heard. Atrial paced   Pulmonary/Chest: Effort normal. No respiratory distress. She has no wheezes. She has no rales.  On RA  Abdominal: Soft. Bowel sounds are normal. She exhibits no distension. There is no abdominal tenderness. There is no guarding.  Musculoskeletal:        General: No edema.     Comments: 5+ bilateral leg strength  Neurological: She is alert and oriented to person, place, and time.  Skin: Skin is warm and dry.  Psychiatric: She has a normal mood and affect.   I personally reviewed lab work that was obtained in last 24 hrs. CBC    Component Value Date/Time   WBC 11.8 (H) 06/20/2019 0506   RBC 3.25 (L) 06/20/2019 0506   HGB 11.2 (L) 06/20/2019 0506   HGB 12.8 08/20/2014 0449   HCT 32.6 (L) 06/20/2019 0506   HCT 37.3 08/20/2014 0449   PLT 234 06/20/2019 0506   PLT 278 08/20/2014 0449   MCV 100.3 (H) 06/20/2019 0506   MCV 93 08/20/2014 0449   MCH 34.5 (H) 06/20/2019 0506   MCHC 34.4 06/20/2019 0506   RDW 14.6 06/20/2019 0506   RDW 13.5 08/20/2014 0449   LYMPHSABS 6.1 (H) 06/19/2019 0817   LYMPHSABS 2.1 08/20/2014 0449   MONOABS 1.6 (H) 06/19/2019 0817   MONOABS 1.1 (H) 08/20/2014 0449   EOSABS 0.4 06/19/2019 0817   EOSABS 0.2 08/20/2014 0449   BASOSABS 0.2 (H) 06/19/2019 0817   BASOSABS 0.1 08/20/2014 0449    BMP Latest Ref Rng & Units 06/20/2019 06/19/2019 05/31/2019  Glucose 70 - 99 mg/dL 185(H) 325(H) 128(H)  BUN 8 - 23 mg/dL 18 15 18   Creatinine 0.44 - 1.00 mg/dL 1.10(H) 1.28(H) 1.14(H)  BUN/Creat Ratio 6 - 22 (calc) - - -  Sodium 135 - 145 mmol/L 138 137 137  Potassium 3.5 - 5.1 mmol/L 3.2(L) 3.9 3.2(L)  Chloride 98 - 111 mmol/L 96(L) 96(L) 102  CO2 22 - 32 mmol/L 27 18(L) 28  Calcium 8.9 - 10.3 mg/dL 8.5(L) 8.6(L) 8.5(L)    MEDICATIONS: I have reviewed all medications and confirmed regimen as documented     ASSESSMENT AND PLAN SYNOPSIS  68 y.o female with acute hypoxic respiratory failure likely secondary to CHF exacerbation no longer requiring supplemental oxygen receiving lasix.   Severe ACUTE Hypoxic Respiratory Failure Hx: COPD -Resolved -Tolerating RA with SpO2 >90% -Continue scheduled and prn bronchodilator therapy -D/C IV steroids  ACUTE SYSTOLIC CARDIAC FAILURE- EF 40% -Continue Lasix as tolerated  -Continue outpatient aspirin, atorvastatin, carvedilol, clopidogrel, and entresto -Cardiology consulted appreciate input    Acute on chronic renal railure Lactic acidosis -Trend BMP -Replace electrolytes as indicted -follow UO -Avoid nephrotoxic agents   CARDIAC ICU monitoring   Chronic anemia - oral Ferrous sulfate   ID -D/C antibiotics (8/13) as there is no source of infection at this time -follow up cultures  GI/ GERD GI PROPHYLAXIS as indicated  -oral pantoprazole   Depression Continue outpatient lexapro   NUTRITIONAL STATUS DIET-->advance at tolerated  Constipation protocol as indicated  ENDO - insulin given to manage hyperglycemia   ELECTROLYTES -hypokalemia   -oral potassium given  -follow labs as needed -replace as needed -pharmacy consultation and following   DVT/GI PRX ordered TRANSFUSIONS AS NEEDED MONITOR FSBS ASSESS the need for LABS  Patient has improved, no longer requiring supplemental oxygen, plan to transfer to med-surg floor    Anibal Henderson PA-S

## 2019-06-20 NOTE — Telephone Encounter (Signed)
-----   Message from Wilhelmina Mcardle, MD sent at 06/20/2019  3:47 PM EDT ----- Please arrange new consult/post hosp follow up with any provider in 4-6 weeks  Thanks  Waunita Schooner

## 2019-06-20 NOTE — Plan of Care (Signed)
  Problem: Education: Goal: Knowledge of General Education information will improve Description: Including pain rating scale, medication(s)/side effects and non-pharmacologic comfort measures Outcome: Progressing   Problem: Clinical Measurements: Goal: Diagnostic test results will improve Outcome: Progressing Goal: Respiratory complications will improve Outcome: Progressing   Problem: Activity: Goal: Risk for activity intolerance will decrease Outcome: Progressing   Problem: Nutrition: Goal: Adequate nutrition will be maintained Outcome: Progressing   

## 2019-06-20 NOTE — Telephone Encounter (Signed)
Pt currently admitted.  Will call to scheduled f/u once she is discharged.

## 2019-06-20 NOTE — Progress Notes (Signed)
[place] [facility] [place] NAME: Ashley [name]    MR#:  [id number]  DATE OF BIRTH:  [birth year]  SUBJECTIVE:   Patient much improved since yesterday off BiPAP, shortness of breath is improved.  Improved with diuresis and treatment of underlying COPD.    REVIEW OF SYSTEMS:    Review of Systems  Constitutional: Negative for chills and fever.  HENT: Negative for congestion and tinnitus.   Eyes: Negative for blurred vision and double vision.  Respiratory: Negative for cough, shortness of breath and wheezing.   Cardiovascular: Negative for chest pain, orthopnea and PND.  Gastrointestinal: Negative for abdominal pain, diarrhea, nausea and vomiting.  Genitourinary: Negative for dysuria and hematuria.  Neurological: Negative for dizziness, sensory change and focal weakness.  All other systems reviewed and are negative.   Nutrition: Heart Healthy Tolerating Diet: Yes Tolerating PT: Await Eval.   DRUG ALLERGIES:   Allergies  Allergen Reactions  . Hydrocodone-Acetaminophen Nausea Only    VITALS:  Blood pressure 117/78, pulse 84, temperature 97.9 F (36.6 C), resp. rate 16, height 5\' 4"  (1.626 m), weight 74.5 kg, SpO2 97 %.  PHYSICAL EXAMINATION:   Physical Exam  GENERAL:  68 y.o.-year-old patient lying in bed in no acute distress.  EYES: Pupils equal, round, reactive to light and accommodation. No scleral icterus. Extraocular muscles intact.  HEENT: Head atraumatic, normocephalic. Oropharynx and nasopharynx clear.  NECK:  Supple, no jugular venous distention. No thyroid enlargement, no tenderness.  LUNGS: Good air entry bilaterally, minimal wheezing, Rales.  Negative use of accessory muscles, no dullness to percussion. CARDIOVASCULAR: S1, S2 normal. No murmurs, rubs, or gallops.  ABDOMEN: Soft, nontender, nondistended. Bowel sounds present. No organomegaly or mass.  EXTREMITIES: No cyanosis, clubbing or edema b/l.    NEUROLOGIC: Cranial  nerves II through XII are intact. No focal Motor or sensory deficits b/l.   PSYCHIATRIC: The patient is alert and oriented x 3.  SKIN: No obvious rash, lesion, or ulcer.    LABORATORY PANEL:   CBC Recent Labs  Lab 06/20/19 0506  WBC 11.8*  HGB 11.2*  HCT 32.6*  PLT 234   ------------------------------------------------------------------------------------------------------------------  Chemistries  Recent Labs  Lab 06/19/19 0817 06/20/19 0506  NA 137 138  K 3.9 3.2*  CL 96* 96*  CO2 18* 27  GLUCOSE 325* 185*  BUN 15 18  CREATININE 1.28* 1.10*  CALCIUM 8.6* 8.5*  AST 33  --   ALT 17  --   ALKPHOS 86  --   BILITOT 1.2  --    ------------------------------------------------------------------------------------------------------------------  Cardiac Enzymes No results for input(s): TROPONINI in the last 168 hours. ------------------------------------------------------------------------------------------------------------------  RADIOLOGY:  Dg Chest Portable 1 View  Result Date: 06/19/2019 CLINICAL DATA:  Evaluate for pneumonia. EXAM: PORTABLE CHEST 1 VIEW COMPARISON:  May 29, 2019 FINDINGS: Postsurgical changes from CABG. Stable appearance of cardiac pacemaker leads. Enlarged cardiac silhouette. Diffuse bilateral interstitial and alveolar opacities. No evidence of pleural effusion or pneumothorax. Osseous structures are without acute abnormality. Soft tissues are grossly normal. IMPRESSION: Diffuse bilateral interstitial and alveolar opacities which may represent mixed pattern pulmonary edema or less likely atypical multifocal pneumonia. Electronically Signed   By: Fidela Salisbury M.D.   On: 06/19/2019 08:44     ASSESSMENT AND PLAN:   68 y.o. female with a known history of hypertension, hyperlipidemia, history of coronary artery bypass graft surgery, coronary artery disease, peripheral vascular disease, chronic systolic CHF who presents to the hospital due to  shortness of breath.  1.  Acute respiratory failure with hypoxia- etiology unclear but suspected to be secondary to combination of COPD exacerbation and mild CHF. -Initially was on BiPAP but now weaned off of it.  Improved with IV diuresis and treatment of underlying COPD.  Off IV diuresis and IV steroids now. -Continue PRN duo nebs, Pulmicort nebs.  Transfer to floor later today.  2.  COPD exacerbation- patient has ongoing tobacco abuse, chest x-ray showing possible pulmonary edema/atypical pneumonia.  -Improved with IV steroids, scheduled duo nebs, Pulmicort nebs.  No evidence of pneumonia or infection. -off IV steroids and cont. Nebs.  - assess pt. For Home O2 prior to discharge.   3.  Suspected pneumonia- patient received 1 dose of IV vancomycin and cefepime in the ER. -Ruled out, procalcitonin level was normal.  Hold off on antibiotics.  4.  CHF-acute on chronic systolic dysfunction.  Patient has a previous cardiomyopathy EF of 45%. -Diuresed with IV Lasix and has improved.  Off IV diuresis now.  Seen by cardiology and no plans for any other further intervention. -Continue carvedilol, Entresto.  5.  Hyperlipidemia-continue atorvastatin.  6.  Depression-continue Lexapro.  7.  History of coronary disease status post bypass-no acute chest pain presently. -Continue aspirin, Plavix, atorvastatin, carvedilol.  Transfer to floor today, await PT eval.  All the records are reviewed and case discussed with Care Management/Social Worker. Management plans discussed with the patient, family and they are in agreement.  CODE STATUS: Full code  DVT Prophylaxis: Lovenox  TOTAL TIME TAKING CARE OF THIS PATIENT: 30 minutes.   POSSIBLE D/C IN 2-3 DAYS, DEPENDING ON CLINICAL CONDITION.   Henreitta Leber M.D on 06/20/2019 at 3:12 PM  Between 7am to 6pm - Pager - [phone]  After 6pm go to [email or web address] - Proofreader  [facility]  Office   [phone]  CC: Primary care physician; [name], MD

## 2019-06-21 ENCOUNTER — Inpatient Hospital Stay: Payer: Medicare Other

## 2019-06-21 LAB — BASIC METABOLIC PANEL
Anion gap: 8 (ref 5–15)
BUN: 29 mg/dL — ABNORMAL HIGH (ref 8–23)
CO2: 28 mmol/L (ref 22–32)
Calcium: 8.8 mg/dL — ABNORMAL LOW (ref 8.9–10.3)
Chloride: 102 mmol/L (ref 98–111)
Creatinine, Ser: 1.13 mg/dL — ABNORMAL HIGH (ref 0.44–1.00)
GFR calc Af Amer: 59 mL/min — ABNORMAL LOW (ref >60)
GFR calc non Af Amer: 51 mL/min — ABNORMAL LOW (ref >60)
Glucose, Bld: 138 mg/dL — ABNORMAL HIGH (ref 70–99)
Potassium: 3.7 mmol/L (ref 3.5–5.1)
Sodium: 138 mmol/L (ref 135–145)

## 2019-06-21 LAB — GLUCOSE, CAPILLARY
Glucose-Capillary: 130 mg/dL — ABNORMAL HIGH (ref 70–99)
Glucose-Capillary: 113 mg/dL — ABNORMAL HIGH (ref 70–99)

## 2019-06-21 LAB — BRAIN NATRIURETIC PEPTIDE: B Natriuretic Peptide: 2005 pg/mL — ABNORMAL HIGH (ref 0.0–100.0)

## 2019-06-21 MED ORDER — SPIRONOLACTONE 25 MG PO TABS: 12.5000 mg | ORAL_TABLET | Freq: Every day | ORAL | 0 refills | Status: DC

## 2019-06-21 MED ORDER — FUROSEMIDE 10 MG/ML IJ SOLN
40.0000 mg | Freq: Once | INTRAMUSCULAR | Status: AC
  Administered 2019-06-21: 40 mg via INTRAVENOUS
  Filled 2019-06-21: qty 4

## 2019-06-21 MED ORDER — FUROSEMIDE 40 MG PO TABS: ORAL_TABLET | ORAL | 0 refills | Status: DC

## 2019-06-21 NOTE — Progress Notes (Signed)
PT Cancellation Note  Patient Details Name: ALEXICA SCHLOSSBERG MRN: [id number] DOB: [birth year]   Cancelled Treatment:    Reason Eval/Treat Not Completed: Patient at procedure or test/unavailable.  Order received and chart reviewed.  Physician currently in room.  Will check back shortly when pt is available.  Roxanne Gates, PT, DPT  Roxanne Gates 06/21/2019, 8:50 AM

## 2019-06-21 NOTE — Progress Notes (Signed)
Pt to be discharged today. Iv and tele removed. disch instructions and prescrips given. Pt awaiting transport.

## 2019-06-21 NOTE — Progress Notes (Signed)
PT Cancellation Note  Patient Details Name: Ashley [name] MRN: [id number] DOB: [birth year]   Cancelled Treatment:    Reason Eval/Treat Not Completed: Patient declined, no reason specified.  Pt sitting up at EOB eating breakfast.  Stated that she does not have any PT needs and declined getting OOB.  Physician has stated that pt will be ready to go home soon.  Will discontinue order.  Please consult if future need arises.  Roxanne Gates, PT, DPT  Roxanne Gates 06/21/2019, 8:54 AM

## 2019-06-21 NOTE — Discharge Summary (Signed)
Ashley [name] at [place] NAME: Ashley [name]    MR#:  [id number]  DATE OF BIRTH:  [birth year]  DATE OF ADMISSION:  06/19/2019 ADMITTING PHYSICIAN: Henreitta Leber, MD  DATE OF DISCHARGE: 06/21/2019  1:45 PM  PRIMARY CARE PHYSICIAN: [name], MD    ADMISSION DIAGNOSIS:  Respiratory distress [R06.03] COPD exacerbation ([place]) [J44.1] Acute on chronic congestive heart failure, unspecified heart failure type ([place]) [I50.9]  DISCHARGE DIAGNOSIS:  Active Problems:   Acute respiratory failure with hypoxia ([place])   SECONDARY DIAGNOSIS:   Past Medical History:  Diagnosis Date  . AICD (automatic cardioverter/defibrillator) present    on right side  . Anemia   . CAD (coronary artery disease)    s/p CABG  . CHF (congestive heart failure) ([place])   . Chronic kidney disease    renal artery stenosis  . Colon cancer ([place])   . Depression   . GERD (gastroesophageal reflux disease)   . Hx of colonic polyps   . Hyperlipidemia   . Hypertension   . Mitral valve disorder    s/p mitral valve repair wth CABG  . Myocardial infarction ([place])   . Peripheral vascular disease ([place])   . Presence of permanent cardiac pacemaker    Pacemaker/ Defibrillator    HOSPITAL COURSE:   1.  Acute hypoxic respiratory failure secondary to CHF.  Patient initially required BiPAP and was weaned off of it and now breathing on room air.  Since the patient had responded quickly I believe this is secondary to CHF and not COPD. 2.  Acute systolic congestive heart failure with EF 40 to 45%.  Follow-up at the CHF clinic.  Give a dose of IV Lasix this morning.  Increase Lasix to 40 mg p.o. twice daily for the next 5 days then can go back to Lasix 40 mg daily.  Continue Coreg and Entresto.  Add low-dose Aldactone as outpatient. 3.  I do not think that this is COPD exacerbation or pneumonia.  Procalcitonin negative.  Hold off on further antibiotics.  Chest x-ray repeat today did not  show any pneumonia but did show improved pulmonary vascular congestion.  Continue usual inhalers.  Pharmacist to set up [place] hospital to home program since the patient is out of this medication. 4.  Hyperlipidemia on atorvastatin 5.  Depression on Lexapro 6.  Coronary artery disease on aspirin, Plavix, atorvastatin and Coreg  DISCHARGE CONDITIONS:   Satisfactory  CONSULTS OBTAINED:  Cardiology Critical care specialist  DRUG ALLERGIES:   Allergies  Allergen Reactions  . Hydrocodone-Acetaminophen Nausea Only    DISCHARGE MEDICATIONS:   Allergies as of 06/21/2019      Reactions   Hydrocodone-acetaminophen Nausea Only      Medication List    STOP taking these medications   Fluticasone-Salmeterol 250-50 MCG/DOSE Aepb Commonly known as: Advair Diskus   predniSONE 10 MG tablet Commonly known as: DELTASONE     TAKE these medications   acetaminophen 500 MG tablet Commonly known as: TYLENOL Maximum 6 tablets a day. What changed:   how much to take  how to take this  when to take this  reasons to take this   albuterol 108 (90 Base) MCG/ACT inhaler Commonly known as: Ventolin HFA INHALE 2 PUFFS BY MOUTH INTO THE LUNGS EVERY 6 HOURS AS NEEDED FOR WHEEZING   ALPRAZolam 0.5 MG tablet Commonly known as: XANAX Take 0.5-1 tablets (0.25-0.5 mg total) by mouth at bedtime. TAKE ONE TABLET BY MOUTH AT  BEDTIME AS NEEDED FOR ANXIETY   aspirin 81 MG tablet Take 81 mg by mouth daily.   atorvastatin 40 MG tablet Commonly known as: LIPITOR Take 1 tablet (40 mg total) by mouth daily.   Breo Ellipta 100-25 MCG/INH Aepb Generic drug: fluticasone furoate-vilanterol Inhale 1 puff into the lungs daily.   carvedilol 6.25 MG tablet Commonly known as: COREG TAKE ONE TABLET BY MOUTH TWICE DAILY WITH MEAL   clopidogrel 75 MG tablet Commonly known as: PLAVIX Take 1 tablet (75 mg total) by mouth daily.   cyanocobalamin 1000 MCG/ML injection Commonly known as: (VITAMIN  B-12) Inject 1 mL weekly into the muscle monthly   Diphenhist 25 mg capsule Generic drug: diphenhydrAMINE Take 25 mg by mouth at bedtime as needed.   Entresto 24-26 MG Generic drug: sacubitril-valsartan Take 1 tablet by mouth 2 (two) times daily.   escitalopram 10 MG tablet Commonly known as: LEXAPRO Take 1 tablet (10 mg total) by mouth daily.   ferrous sulfate 325 (65 FE) MG EC tablet Take 325 mg by mouth daily with breakfast.   furosemide 40 MG tablet Commonly known as: LASIX One tablet twice a dya for five days then can cut back to once a day What changed:   how much to take  how to take this  when to take this  additional instructions   ipratropium-albuterol 0.5-2.5 (3) MG/3ML Soln Commonly known as: DUONEB Take 3 mLs by nebulization every 6 (six) hours as needed.   nitroGLYCERIN 0.4 MG SL tablet Commonly known as: NITROSTAT Place 1 tablet (0.4 mg total) under the tongue every 5 (five) minutes as needed for chest pain.   omeprazole 40 MG capsule Commonly known as: PRILOSEC Take 1 capsule (40 mg total) by mouth daily.   potassium chloride 10 MEQ tablet Commonly known as: K-DUR Take 1 tablet (10 mEq total) by mouth daily.   Spiriva Respimat 2.5 MCG/ACT Aers Generic drug: Tiotropium Bromide Monohydrate Inhale 5 mcg into the lungs every morning.   spironolactone 25 MG tablet Commonly known as: Aldactone Take 0.5 tablets (12.5 mg total) by mouth daily.   traZODone 50 MG tablet Commonly known as: DESYREL Take 0.5-1 tablets (25-50 mg total) by mouth daily. One hour before bedtime        DISCHARGE INSTRUCTIONS:   Follow-up PMD 5 days Follow-up cardiology 1 to 2 weeks Follow-up CHF clinic  If you experience worsening of your admission symptoms, develop shortness of breath, life threatening emergency, suicidal or homicidal thoughts you must seek medical attention immediately by calling 911 or calling your MD immediately  if symptoms less severe.  You  Must read complete instructions/literature along with all the possible adverse reactions/side effects for all the Medicines you take and that have been prescribed to you. Take any new Medicines after you have completely understood and accept all the possible adverse reactions/side effects.   Please note  You were cared for by a hospitalist during your hospital stay. If you have any questions about your discharge medications or the care you received while you were in the hospital after you are discharged, you can call the unit and asked to speak with the hospitalist on call if the hospitalist that took care of you is not available. Once you are discharged, your primary care physician will handle any further medical issues. Please note that NO REFILLS for any discharge medications will be authorized once you are discharged, as it is imperative that you return to your primary care physician (or establish  a relationship with a primary care physician if you do not have one) for your aftercare needs so that they can reassess your need for medications and monitor your lab values.    Today   CHIEF COMPLAINT:   Chief Complaint  Patient presents with  . Respiratory Distress    HISTORY OF PRESENT ILLNESS:  Ashley [name]  is a 68 y.o. female with a known history of CHF and COPD presented in respiratory distress   VITAL SIGNS:  Blood pressure 115/74, pulse 81, temperature 97.6 F (36.4 C), temperature source Oral, resp. rate 19, height 5\' 3"  (1.6 m), weight 76 kg, SpO2 98 %.    PHYSICAL EXAMINATION:  GENERAL:  68 y.o.-year-old patient lying in the bed with no acute distress.  EYES: Pupils equal, round, reactive to light and accommodation. No scleral icterus. Extraocular muscles intact.  HEENT: Head atraumatic, normocephalic. Oropharynx and nasopharynx clear.  NECK:  Supple, no jugular venous distention. No thyroid enlargement, no tenderness.  LUNGS: Decreased breath sounds bilaterally, no  wheezing, rales,rhonchi or crepitation. No use of accessory muscles of respiration.  CARDIOVASCULAR: S1, S2 normal. No murmurs, rubs, or gallops.  ABDOMEN: Soft, non-tender, non-distended. Bowel sounds present. No organomegaly or mass.  EXTREMITIES: No pedal edema, cyanosis, or clubbing.  NEUROLOGIC: Cranial nerves II through XII are intact. Muscle strength 5/5 in all extremities. Sensation intact. Gait not checked.  PSYCHIATRIC: The patient is alert and oriented x 3.  SKIN: No obvious rash, lesion, or ulcer.   DATA REVIEW:   CBC Recent Labs  Lab 06/20/19 0506  WBC 11.8*  HGB 11.2*  HCT 32.6*  PLT 234    Chemistries  Recent Labs  Lab 06/19/19 0817  06/21/19 0558  NA 137   < > 138  K 3.9   < > 3.7  CL 96*   < > 102  CO2 18*   < > 28  GLUCOSE 325*   < > 138*  BUN 15   < > 29*  CREATININE 1.28*   < > 1.13*  CALCIUM 8.6*   < > 8.8*  AST 33  --   --   ALT 17  --   --   ALKPHOS 86  --   --   BILITOT 1.2  --   --    < > = values in this interval not displayed.    Microbiology Results  Results for orders placed or performed during the hospital encounter of 06/19/19  SARS Coronavirus 2 [facility] order, Performed in [facility] hospital lab) Nasopharyngeal Nasopharyngeal Swab     Status: None   Collection Time: 06/19/19  8:18 AM   Specimen: Nasopharyngeal Swab  Result Value Ref Range Status   SARS Coronavirus 2 NEGATIVE NEGATIVE Final    Comment: (NOTE) If result is NEGATIVE SARS-CoV-2 target nucleic acids are NOT DETECTED. The SARS-CoV-2 RNA is generally detectable in upper and lower  respiratory specimens during the acute phase of infection. The lowest  concentration of SARS-CoV-2 viral copies this assay can detect is 250  copies / mL. A negative result does not preclude SARS-CoV-2 infection  and should not be used as the sole basis for treatment or other  patient management decisions.  A negative result may occur with  improper specimen collection / handling,  submission of specimen other  than nasopharyngeal swab, presence of viral mutation(s) within the  areas targeted by this assay, and inadequate number of viral copies  (<250 copies / mL). A negative result must be  combined with clinical  observations, patient history, and epidemiological information. If result is POSITIVE SARS-CoV-2 target nucleic acids are DETECTED. The SARS-CoV-2 RNA is generally detectable in upper and lower  respiratory specimens dur ing the acute phase of infection.  Positive  results are indicative of active infection with SARS-CoV-2.  Clinical  correlation with patient history and other diagnostic information is  necessary to determine patient infection status.  Positive results do  not rule out bacterial infection or co-infection with other viruses. If result is PRESUMPTIVE POSTIVE SARS-CoV-2 nucleic acids MAY BE PRESENT.   A presumptive positive result was obtained on the submitted specimen  and confirmed on repeat testing.  While 2019 novel coronavirus  (SARS-CoV-2) nucleic acids may be present in the submitted sample  additional confirmatory testing may be necessary for epidemiological  and / or clinical management purposes  to differentiate between  SARS-CoV-2 and other Sarbecovirus currently known to infect humans.  If clinically indicated additional testing with an alternate test  methodology [id number]) is advised. The SARS-CoV-2 RNA is generally  detectable in upper and lower respiratory sp ecimens during the acute  phase of infection. The expected result is Negative. Fact Sheet for Patients:  StrictlyIdeas.no Fact Sheet for Healthcare Providers: BankingDealers.co.za This test is not yet approved or cleared by the [place] FDA and has been authorized for detection and/or diagnosis of SARS-CoV-2 by FDA under an Emergency Use Authorization (EUA).  This EUA will remain in effect (meaning this test can be  used) for the duration of the COVID-19 declaration under Section 564(b)(1) of the Act, 21 U.S.C. section 360bbb-3(b)(1), unless the authorization is terminated or revoked sooner. Performed at [facility], [address] [place], [place] [address]   Blood Culture (routine x 2)     Status: None (Preliminary result)   Collection Time: 06/19/19  9:48 AM   Specimen: BLOOD  Result Value Ref Range Status   Specimen Description BLOOD BLOOD RIGHT HAND  Final   Special Requests   Final    BOTTLES DRAWN AEROBIC AND ANAEROBIC Blood Culture adequate volume   Culture   Final    NO GROWTH 2 DAYS Performed at [facility], [address], [place], [place] [address]    Report Status PENDING  Incomplete  Blood Culture (routine x 2)     Status: None (Preliminary result)   Collection Time: 06/19/19  9:56 AM   Specimen: BLOOD  Result Value Ref Range Status   Specimen Description BLOOD LEFT ANTECUBITAL  Final   Special Requests   Final    BOTTLES DRAWN AEROBIC AND ANAEROBIC Blood Culture adequate volume   Culture   Final    NO GROWTH 2 DAYS Performed at [facility], [address], [place], [place] [address]    Report Status PENDING  Incomplete    RADIOLOGY:  Dg Chest 2 View  Result Date: 06/21/2019 CLINICAL DATA:  Acute respiratory failure. EXAM: CHEST - 2 VIEW COMPARISON:  06/19/2019 FINDINGS: Right chest wall ICD is noted with leads in the right atrial appendage, right ventricle coronary sinus. Previous median sternotomy and CABG procedure. Mild cardiac enlargement. No pleural effusion. Pulmonary edema pattern is improved from 06/19/2019. No airspace consolidation. IMPRESSION: 1. Interval decrease in pulmonary edema compared with previous exam. Electronically Signed   By: Kerby Moors M.D.   On: 06/21/2019 10:29    Management plans discussed with the patient, family and they are in agreement.  CODE STATUS:     Code Status Orders  (  From admission, onward)          Start     Ordered   06/19/19 1431  Full code  Continuous     06/19/19 1430        Code Status History    Date Active Date Inactive Code Status Order ID Comments User Context   05/27/2019 0630 05/31/2019 2111 Full Code [id number]  Sidney Ace Arvella Merles, MD ED   03/16/2019 1354 03/17/2019 1617 Full Code [id number]  Gorden Harms, MD Inpatient   07/17/2018 1214 07/19/2018 1548 Full Code [id number]  Dustin Flock, MD ED   10/23/2016 0917 10/23/2016 1630 Full Code [id number]  Algernon Huxley, MD Inpatient   06/21/2016 0437 06/21/2016 1758 Full Code [id number]  [place] Foreman, MD Inpatient   Advance Care Planning Activity      TOTAL TIME TAKING CARE OF THIS PATIENT: 35 minutes.    Loletha Grayer M.D on 06/21/2019 at 3:27 PM  Between 7am to 6pm - Pager - [phone]  After 6pm go to [email or web address] - password [email or web address]  [employer] Office  [phone]  CC: Primary care physician; [name], MD

## 2019-06-23 ENCOUNTER — Other Ambulatory Visit: Payer: Self-pay | Admitting: *Deleted

## 2019-06-23 ENCOUNTER — Encounter: Payer: Self-pay | Admitting: *Deleted

## 2019-06-23 NOTE — Telephone Encounter (Signed)
Pt scheduled for hospital follow up- Pt aware of appointment date and time. 07/24/2019 @ 11:00 am.

## 2019-06-23 NOTE — Patient Outreach (Signed)
[facility]) [place] Telephone Outreach PCP office completes Transition of Care outreach post-hospital discharge Post-hospital discharge day # 2  06/23/2019  Ashley [name] Jun 01, 1951 [id number]  [place] outreach to [name], 68 y/o femalereferred to [place] by [facility] Liaison RN CM after recent hospitalization July 21-25, 2020 for acute on chronic CHF exacerbation.Patientwas discharged from hospital to home/ self-care without home health services in place. Patient has history including, but not limited to, combined CHF; CAD with previous MI/ CABG, and ICD; HTN/ HLD; GERD; COPD; ongoing tobacco use.  Unfortunately, patient experienced hospital readmission 18 days after her hospital discharge, August 13-15, 2020 for acute respiratory failure with hypoxia; patient was again discharged home without home health services in place.  HIPAA/ identity verified; pleasant phone call.  Today, patient reports that she "is tired after the hospital visit, but overall better."  Patient states that she was driving to have her scheduled lab work completed and while driving became "so short of breath" that she "could not breathe."  Patient stated that she pulled the car over and contacted EMS.  Patient denies pain today and sounds to be in no distress throughout entirety of phone call.  Patient further reports:  -- Has not yet obtained newly prescribed diuretic (spironolactone) post-hospital discharge: is aware of new prescription and confirms that she plans to obtain today; declines need for assistance in following up on this medication and reports that her outpatient pharmacy has contacted her that this medication is ready for pick up.  Reviewed all post-hospital discharge medication instructions with patient and confirmed that patient is able to accurately verbalize all post-discharge medication instructions.  Discussed with  patient that we will review all medications at time of next outreach, already scheduled for next week, patient is agreeable and denies medication concerns today.  -- reviewed all scheduled provider appointments and confirmed that patient has reliable transportation through family members/ self; reviewed all recent and upcoming provider appointments with patient who has accurate understanding of all and verbalizes plans to attend all.  Accuartely reports that she has scheduled newly referred pulmonology provider office visit for next month.  Self-health management ofchronic disease state of CHF:  -- patient has continues established routine of daily weight monitoring/ recording at home, along with appropriate action plan/ weight gain guidelines in setting of CHF; reports weight ranges at home post-most recent hospital discharge continue "about the same" between "165-166 lbs;" with weight today of "166 lbs"  -- patient reports that since her most recent hospital discharge on August 15, she has not smoked any cigarettes!  Positive reinforcement provided:  Patient stated that while she was hospitalized, her fiancee whom she lives with was also hospitalized; states that other household members are now smoking outside only, which has helped her not crave cigarettes.  We reviewed previously provided education around smoking cessation, and I encouraged patient to consider joining support group online, etc to help her continue not smoking, which she identifies as a goal today.  Patient hopeful that when her fiancee is released from the hospital that he will also want to quit.  Encouraged patient to partner with her fiancee to improve her chances of remaining smoke free, as well as to share with me any other successful strategies she has in remaining quit.  Patient continues to state that she does not wish to have to be on home O2 "in the future."  -- reviewed signs/ symptoms CHF/ COPD yellow zone: patient  accurately verbalizes signs/ symptoms as well as action plan for both; patient reports "in green zone" today.  Patient denies further issues, concerns, or problems today. Iconfirmed that patient hasmy direct phone number, the main [facility] CM office phone number, and the [facility] CM 24-hour nurse advice phone number should issues arise prior to next scheduled [place] scheduled next week. Encouraged patient to contact me directly if needs, questions, issues, or concerns arise prior to next scheduled outreach; patient agreed to do so.  Plan:  Patient will take medications as prescribed and will attend all scheduled provider appointments  Patient will promptly notify care providers for any new concerns/ issues/ problems that arise  Patient will continue monitoring/ recording daily weights   Patient will continue her efforts to quit smoking  Patient will continue reviewing printed educational resources/ material around smoking cessation for further discussion  [facility] CM outreach to continue with scheduled phone callnextweek for full medication review  [facility] CM Care Plan Problem One     Most Recent Value  Care Plan Problem One  High risk for hospital readmission related to/ as evidenced by recent hospitalization for CHF exacerbation July 21-25, 2020  Role Documenting the Problem One  Care Management [place] for Problem One  Active  [facility] Long Term Goal   Over the next 31 days, patient will not experience unplanned hospital readmission, as evidenced by patient reporting and review of EMR during [place] outreach  [facility] Term Goal Start Date  06/23/19 [Goal re-established today]  Interventions for Problem One Long Term Goal  reviewed with patient current clinical condition and recent hospitalization,  reviewed post-hospital discharge notes and instructions with patient   [facility] CM Short Term Goal #1   Over the next 30 days, patient will continue to engage  with [place] team around stated medication need for assistance, as evidenced by patient reporting and collaboration with [facility] Pharmacist as indicated during [place] outreach  [facility] CM Short Term Goal #1 Start Date  06/04/19  Interventions for Short Term Goal #1  Reviewed medication changes with patient post- most recent hospital discharge and encouraged patient to promptly obtain/ start taking newly prescribed spironolactone,  discussed purpose of this medication with patient and confirmed that she plans to obtain medication from OP pharmacy today  [facility] CM Short Term Goal #2   Over the next 30 days, patient will attend all scheduled provider appointments as evidenced by patient reporting and review of EMR/ collaboration with care providers as indicated during [place] outreach  [facility] CM Short Term Goal #2 Start Date  06/04/19  Interventions for Short Term Goal #2  Reviewed upcoming scheduled appointments with patient and confirmed that she plans to attend all as scheduled and has reliable transportation,  encouraged patient to promptly schedule post-hospital discharge PCP office visit and confirmed that she will attend newly referred pulmonary provider office visit as scheduled    [facility] CM Care Plan Problem Two     Most Recent Value  Care Plan Problem Two  Need for ongoing reinforcement of self-health management strategies for chronic disease state of CHF/ COPD, as evidenced by patient reporting and recent hospitalization for CHF exacerbation [Problem revised to include COPD]  Role Documenting the Problem Two  Care Management Coordinator  Care Plan for Problem Two  Active  Interventions for Problem Two Long Term Goal   Confirmed that patient continues monitoring and recording daily weights and reviewed all post-hospital  discharge weights with patient,  confirmed that she is able to verbalize signs/ symptoms COPD/ CHF yellow zone along with appropriate action plan for same  [facility] Long Term Goal  Over the  next 60 days, patient will continue to monitor and record daily weights at home, as evidenced by patient reporting and review of same during [place] CM outreach  [place] Term Goal Start Date  06/04/19  [facility] CM Short Term Goal #1   Over the next 30 days patient will review printed educational material around smoking cessation and will verbalize personal strategies to stop smoking, as evidenced by patient reporting of same during [facility] RN CM outreach  [facility] CM Short Term Goal #1 Start Date  06/17/19  Interventions for Short Term Goal #2   Confirmed that patient received and has begun reviewing educational material,  encouraged patient's ongoing review for further discussion  [facility] CM Short Term Goal #2   Over the next 10 days, patient will continue not smoking, as evidenced by patient reporting during [facility] RN CM outreach  [facility] CM Short Term Goal #2 Start Date  06/23/19  Interventions for Short Term Goal #2  Discussed with patient her recent decision to not smoke after she was recently discharged home from the hospital,  provided positive reinforcement and reiterated with patient previously discussed strategies to stop smoking,  encouraged patient to consider contacting support group for smoking cessation and to share her strategies with others     [name], RN, BSN, [place] Coordinator [facility] Care Management  [phone]

## 2019-06-24 ENCOUNTER — Ambulatory Visit: Payer: Medicare Other | Admitting: Family

## 2019-06-24 LAB — CULTURE, BLOOD (ROUTINE X 2)
Culture: NO GROWTH
Culture: NO GROWTH
Special Requests: ADEQUATE
Special Requests: ADEQUATE

## 2019-06-26 ENCOUNTER — Ambulatory Visit: Payer: Medicare Other | Admitting: Family

## 2019-06-26 ENCOUNTER — Ambulatory Visit: Payer: Self-pay | Admitting: Pharmacist

## 2019-06-26 DIAGNOSIS — J439 Emphysema, unspecified: Secondary | ICD-10-CM

## 2019-06-26 NOTE — Patient Instructions (Signed)
Visit Information  Goals Addressed            This Visit's Progress     Patient Stated   . "I can't afford my medications and I'm out" (pt-stated)       Current Barriers:  . Financial concerns - patient notes that she was unable to afford her Medicare Part D premium, so lost coverage. [facility] helped her apply for LIS, she has been approved for this. She notes that she called Medicare about 2 days after receiving the letter in order to restart a Medicare plan, but the LIS approval was not in their system yet.  . Readmitted 06/19/2019 for respiratory distress. Started on spironolactone- notes today that she picked this up and has been taking it daily.  Addison Bailey that she has NOT smoked cigarettes since discharge.  . Notes that she is still out of Spiriva. She has not been by clinic to sign [employer] applications for patient assistance. [facility] did not have any Spiriva or Incruse in stock, and we do not have any samples of this.  . Patient has an appointment with [facility] on in September   Pharmacist Clinical Goal(s):  [name] Over the next 30 days, patient will work with PharmD to address needs related to medication access  Interventions: . [place] drug representative to see if we can order sample Spiriva for this patient; will await response.  . If we cannot get Spiriva sample to our clinic, will outreach [facility] to see if they would be able to provide a sample; doubtful, since patient has not been seen at this clinic yet . Patient will have her fiance come pick up the [place] application for her signature, and return with a copy of her social security statement  Patient Self Care Activities:  . Self administers medications as prescribed . Calls pharmacy for medication refills  Please see past updates related to this goal by clicking on the "Past Updates" button in the selected goal          The patient verbalized understanding of instructions provided today and declined a print copy of patient instruction materials.   Plan:  - Will await call back from [place].  - Will continue to collaborate with patient on medication access concerns.   [name] Darnelle Maffucci, PharmD, [place] Pharmacist [facility] [place] [phone]

## 2019-06-26 NOTE — Chronic Care Management (AMB) (Signed)
Chronic Care Management   Follow Up Note   06/26/2019 Name: Ashley [name] MRN: [id number] DOB: 1951/04/20  Referred by: [name], MD Reason for referral : Chronic Care Management (Medication Management)   Ashley [name] is a 68 y.o. year old female who is a primary care patient of [name], [name], MD. The CCM team was consulted for assistance with chronic disease management and care coordination needs.    Spoke with patient today regarding medication management.  Review of patient status, including review of consultants reports, relevant laboratory and other test results, and collaboration with appropriate care team members and the patient's provider was performed as part of comprehensive patient evaluation and provision of chronic care management services.    SDOH (Social Determinants of Health) screening performed today: Financial Strain . See Care Plan for related entries.   Outpatient Encounter Medications as of 06/26/2019  Medication Sig Note   fluticasone furoate-vilanterol (BREO ELLIPTA) 100-25 MCG/INH AEPB Inhale 1 puff into the lungs daily.    sacubitril-valsartan (ENTRESTO) 24-26 MG Take 1 tablet by mouth 2 (two) times daily.    spironolactone (ALDACTONE) 25 MG tablet Take 0.5 tablets (12.5 mg total) by mouth daily.    acetaminophen (TYLENOL) 500 MG tablet Maximum 6 tablets a day. (Patient taking differently: Take 500 mg by mouth every 4 (four) hours as needed for fever. Maximum 6 tablets a day.)    albuterol (VENTOLIN HFA) 108 (90 Base) MCG/ACT inhaler INHALE 2 PUFFS BY MOUTH INTO THE LUNGS EVERY 6 HOURS AS NEEDED FOR WHEEZING    ALPRAZolam (XANAX) 0.5 MG tablet Take 0.5-1 tablets (0.25-0.5 mg total) by mouth at bedtime. TAKE ONE TABLET BY MOUTH AT BEDTIME AS NEEDED FOR ANXIETY    aspirin 81 MG tablet Take 81 mg by mouth daily.    atorvastatin (LIPITOR) 40 MG tablet Take 1 tablet (40 mg total) by mouth daily.    carvedilol (COREG) 6.25 MG tablet TAKE ONE  TABLET BY MOUTH TWICE DAILY WITH MEAL    clopidogrel (PLAVIX) 75 MG tablet Take 1 tablet (75 mg total) by mouth daily.    cyanocobalamin (,VITAMIN B-12,) 1000 MCG/ML injection Inject 1 mL weekly into the muscle monthly    diphenhydrAMINE (DIPHENHIST) 25 mg capsule Take 25 mg by mouth at bedtime as needed.     escitalopram (LEXAPRO) 10 MG tablet Take 1 tablet (10 mg total) by mouth daily.    ferrous sulfate 325 (65 FE) MG EC tablet Take 325 mg by mouth daily with breakfast.    furosemide (LASIX) 40 MG tablet One tablet twice a dya for five days then can cut back to once a day    ipratropium-albuterol (DUONEB) 0.5-2.5 (3) MG/3ML SOLN Take 3 mLs by nebulization every 6 (six) hours as needed.    nitroGLYCERIN (NITROSTAT) 0.4 MG SL tablet Place 1 tablet (0.4 mg total) under the tongue every 5 (five) minutes as needed for chest pain.    omeprazole (PRILOSEC) 40 MG capsule Take 1 capsule (40 mg total) by mouth daily.    potassium chloride (K-DUR) 10 MEQ tablet Take 1 tablet (10 mEq total) by mouth daily.    Tiotropium Bromide Monohydrate (SPIRIVA RESPIMAT) 2.5 MCG/ACT AERS Inhale 5 mcg into the lungs every morning. (Patient not taking: Reported on 06/17/2019) 06/17/2019: Working with [place] on financial assistance   traZODone (DESYREL) 50 MG tablet Take 0.5-1 tablets (25-50 mg total) by mouth daily. One hour before bedtime    No facility-administered encounter medications on file as  of 06/26/2019.      Goals Addressed            This Visit's Progress     Patient Stated    "I can't afford my medications and I'm out" (pt-stated)       Current Barriers:   Financial concerns - patient notes that she was unable to afford her Medicare Part D premium, so lost coverage. [facility] helped her apply for LIS, she has been approved for this. She notes that she called Medicare about 2 days after receiving the letter in order to restart a Medicare plan, but the LIS approval  was not in their system yet.   Readmitted 06/19/2019 for respiratory distress. Started on spironolactone- notes today that she picked this up and has been taking it daily.   Confirms that she has NOT smoked cigarettes since discharge.   Notes that she is still out of Spiriva. She has not been by clinic to sign [employer] applications for patient assistance. [facility] did not have any Spiriva or Incruse in stock, and we do not have any samples of this.   Patient has an appointment with [facility] on in September   Pharmacist Clinical Goal(s):   Over the next 30 days, patient will work with PharmD to address needs related to medication access  Interventions:  [place] drug representative to see if we can order sample Spiriva for this patient; will await response.   If we cannot get Spiriva sample to our clinic, will outreach [facility] to see if they would be able to provide a sample; doubtful, since patient has not been seen at this clinic yet  Patient will have her fiance come pick up the [place] application for her signature, and return with a copy of her social security statement  Patient Self Care Activities:   Self administers medications as prescribed  Calls pharmacy for medication refills  Please see past updates related to this goal by clicking on the "Past Updates" button in the selected goal          Plan:  - Will await call back from [place].  - Will continue to collaborate with patient on medication access concerns.   [name] Darnelle Maffucci, PharmD, [place] Pharmacist [facility] [phone]

## 2019-07-01 ENCOUNTER — Other Ambulatory Visit: Payer: Self-pay | Admitting: *Deleted

## 2019-07-01 ENCOUNTER — Encounter: Payer: Self-pay | Admitting: *Deleted

## 2019-07-01 NOTE — Patient Outreach (Signed)
[facility] [place] Telephone Outreach PCP completes Transition of Care follow up post-hospital discharge Post-hospital discharge day # 10  07/01/2019  JOSHALYN ANCHETA Aug 09, 1951 [id number]  Successfultelephone outreach to [name], 68 y/o femalereferred to [place] by [facility]iaison RN CM after recent hospitalization July 21-25, 2020 for acute on chronic CHF exacerbation.Patientwas discharged from hospital to home/ self-care without home health services in place. Patient has history including, but not limited to, combined CHF; CAD with previous MI/ CABG, and ICD; HTN/ HLD; GERD; COPD; tobacco use.  Unfortunately, patient experienced hospital readmission 18 days after her hospital discharge, August 13-15, 2020 for acute respiratory failure with hypoxia; patient was again discharged home without home health services in place.  HIPAA/ identity verified;pleasant 30 minute phone call.  Today, patient reports that she "is doing fine but has developed gout" in her (L) ankle/ foot; reports pain/ redness/ swelling started "about 2-3 days ago," when she got out of bed in the morning.  Denies pain during our conversation, but states pain with ambulation is "8/10;" states "tylenol helping;" encouraged patient to contact PCP office to report new onset of gout; patient reports she will do.  Using walker for ambulation due to new onset of gout; denies new/ recent falls; sounds to be in no distress throughout entirety of phone call today.  Patient further reports:  -- no medication concerns- reports continuing to work with [facility] CM Pharmacist [name] on addressing her medication concerns; confirms that she has obtained/ is taking newly prescribed spironolactone.  Maintaining contact with her insurance provider on status of "extra help" application; encouraged patient to maintain ongoing communication with [place] Pharmacist and  patient agrees to do.  Patient was recently discharged from hospital and all medications were thoroughly reviewed with patient today; no concerns/ issues identified as result of medication review and medication list in EMR was updated according to patient report.  Briefly discussed options around patient obtaining nicotine patches for ongoing smoking cessation, however, patient reports she does not think she needs, as she is doing well not smoking and not having issues around cravings.  -- reviewed all upcoming scheduled provider appointmentsand confirmed that patient continues having reliable transportation through family members/ self; reviewed all recent and upcoming provider appointments with patient who has accurate understanding of all and verbalizes plans to attend all.    Self-health management ofchronic disease state of CHF:  -- patient hascontinuedestablishedroutineof daily weight monitoring/recording at home, along with appropriate action plan/ weight gain guidelines in setting of CHF; reports weight ranges at home post-most recent hospital discharge continue "the same" between "165-166 lbs;" with weight today of "166 lbs"  -- patient again reports that since her most recent hospital discharge on August 15, she has still not smoked any cigarettes!  Positive reinforcement and encouragement provided: patient continues to report that family members living with her continue to smoke outside; denies intense cravings and states what has helped most, "is just remembering the hospital visit and ignoring" any urges to smoke.  Confirms that she received/ reviewed previously mailed printed educational materials around smoking and self-health management of CHF/ COPD ---- states she believes that her breathing "is much better" after not smoking for almost 2 weeks ---- using rescue inhaler "once or twice a day," has not needed to use nebulizer since our last outreach   -- reviewed signs/ symptoms  CHF/ COPD yellow zone: patient accurately verbalizes signs/ symptoms as well as action plan  for both; patient reports "in green zone" today  -- continues to verbalize desire to increase activity: we discussed need to pace her activity and to avoid excessive heat/ humidity; encouraged patient to begin short intervals of activity and to slowly increase as tolerated; patient was encouraged not to start activity until her (L) foot gout has resolved  Patient denies further issues, concerns, or problems today. Iconfirmed that patient hasmy direct phone number, the main [facility] CM office phone number, and the [facility] CM 24-hour nurse advice phone number should issues arise prior to next scheduled [facility] Community CM outreachnext month. Encouraged patient to contact me directly if needs, questions, issues, or concerns arise prior to next scheduled outreach; patient agreed to do so.  Plan:  Patient will take medications as prescribed and will attend all scheduled provider appointments  Patient will promptly notify care providers for any new concerns/ issues/ problems that arise  Patient will continue monitoring/ recording daily weights   Patient will continue her efforts to quit smoking  Patientwill continue reviewing printed educational resources/ material around smoking cessation for further/ ongoing discussion  [facility] Community CM outreach to continue with scheduled phone callnextweek month  [facility] CM Care Plan Problem One     Most Recent Value  Care Plan Problem One  High risk for hospital readmission related to/ as evidenced by recent hospitalization for CHF exacerbation July 21-25, 2020  Role Documenting the Problem One  Care Management [place] for Problem One  Active  [facility] Long Term Goal   Over the next 27 days, patient will not experience unplanned hospital readmission, as evidenced by patient reporting and review of EMR during [place] outreach [Goal extended 27 days to cover  upcoming pulmonary appt]  [place] Term Goal Start Date  07/01/19  Interventions for Problem One Long Term Goal  Discussed with patient her current clinical condition and encouraged patient to contact PCP around her newly reported symptoms of gout,  confirmed that patient doe snot have additional clinical concerns  [facility] CM Short Term Goal #1   Over the next 30 days, patient will continue to engage with [place] [facility] team around stated medication need for assistance, as evidenced by patient reporting and collaboration with [facility] Pharmacist as indicated during [place] outreach  [facility] CM Short Term Goal #1 Start Date  06/04/19  [facility] CM Short Term Goal #1 Met Date  07/01/19 [Goal Met]  Interventions for Short Term Goal #1  Confirmed that patient has maintained engagement with [place] team and continues working with [place] team on medication assistance  [facility] CM Short Term Goal #2   Over the next 30 days, patient will attend all scheduled provider appointments as evidenced by patient reporting and review of EMR/ collaboration with care providers as indicated during [place] outreach  [facility] CM Short Term Goal #2 Start Date  06/04/19  [facility] CM Short Term Goal #2 Met Date  07/01/19 [Goal met]  Interventions for Short Term Goal #2  Reviewed upcoming provider appointments with patient and confirmed that she has accurate understanding of appointment times/ dates,  re-confirmed that patient has no transportation concerns and plans to drive herself/ or have family drive her to all upcoming appointments    [facility] CM Care Plan Problem Two     Most Recent Value  Care Plan Problem Two  Need for ongoing reinforcement of self-health management strategies for chronic disease state of CHF/ COPD, as evidenced by patient reporting  and recent hospitalization for CHF exacerbation   Role Documenting the Problem Two  Care Management Coordinator  Care Plan for Problem Two  Active  Interventions for Problem Two Long Term Goal    Confirmed that patient continues monitoring and recording daily weights at home and reviewed daily weight ranges with patient,  confirmed that patient is able to verbalize signs/ symptoms CHF/ COPD yellow zone along with corresponding action plan,  confirmed that patient is able to verbalize weight gain guidelines in setting of CHF/ COPD  [facility] Long Term Goal  Over the next 60 days, patient will continue to monitor and record daily weights at home, as evidenced by patient reporting and review of same during [place] CM outreach  [place] Term Goal Start Date  06/04/19  [facility] CM Short Term Goal #1   Over the next 30 days patient will review printed educational material around smoking cessation and will verbalize personal strategies to stop smoking, as evidenced by patient reporting of same during [facility] RN CM outreach  [facility] CM Short Term Goal #1 Start Date  06/17/19  [facility] CM Short Term Goal #1 Met Date   07/01/19 [Goal Met]  Interventions for Short Term Goal #2   Confirmed that patient received and has reviewed previously mailed printed educational materials around smoking cessation/ self-health management of CHF/ COPD  [facility] CM Short Term Goal #2   Over the next 27 days, patient will continue not smoking, as evidenced by patient reporting during [facility] RN CM outreach [Goal extended today]  [facility] CM Short Term Goal #2 Start Date  07/01/19  Interventions for Short Term Goal #2  Confirmed that patient has continued to not smoke since her last hospital discharge- positive reinforcement/ encouragement provided,  discussed patient's personal strategies for smoking cessation with her and her breathing status since she has stopped smoking     [name], RN, BSN, [employer] Coordinator [facility] Care Management  [phone]

## 2019-07-03 ENCOUNTER — Other Ambulatory Visit: Payer: Self-pay

## 2019-07-03 ENCOUNTER — Ambulatory Visit: Payer: Medicare Other | Admitting: Pharmacist

## 2019-07-03 ENCOUNTER — Ambulatory Visit (INDEPENDENT_AMBULATORY_CARE_PROVIDER_SITE_OTHER): Payer: Medicare Other | Admitting: Family Medicine

## 2019-07-03 ENCOUNTER — Encounter: Payer: Self-pay | Admitting: Family Medicine

## 2019-07-03 VITALS — BP 132/78 | HR 79 | Temp 96.9°F | Wt 169.0 lb

## 2019-07-03 DIAGNOSIS — I5043 Acute on chronic combined systolic (congestive) and diastolic (congestive) heart failure: Secondary | ICD-10-CM

## 2019-07-03 DIAGNOSIS — M109 Gout, unspecified: Secondary | ICD-10-CM

## 2019-07-03 DIAGNOSIS — J9611 Chronic respiratory failure with hypoxia: Secondary | ICD-10-CM

## 2019-07-03 DIAGNOSIS — J9612 Chronic respiratory failure with hypercapnia: Secondary | ICD-10-CM

## 2019-07-03 MED ORDER — SPIRIVA RESPIMAT 2.5 MCG/ACT IN AERS: 5.0000 ug | INHALATION_SPRAY | Freq: Every morning | RESPIRATORY_TRACT | 11 refills | Status: DC

## 2019-07-03 MED ORDER — PREDNISONE 10 MG PO TABS: ORAL_TABLET | ORAL | 0 refills | Status: DC

## 2019-07-03 NOTE — Progress Notes (Signed)
Subjective:    Patient ID: Ashley [name], female    DOB: 1951/06/30, 68 y.o.   MRN: [id number]  HPI  Patient presents to clinic complaining of pain in left foot/left ankle.  Suspects possibly is gout.  Left foot ankle feel a little swollen and red.  No pitting edema.  No fever or chills.  Has pain in foot when putting full pressure and to take a step.  Denies any history of blood clotting disorders.  She tries to get up and walk around every day to keep self from getting too stiff, does use a cane when she walks at times.  Has not traveled long distances in a car or airplane recently.  Patient Active Problem List   Diagnosis Date Noted  . Acute respiratory failure with hypoxia ([place]) 06/19/2019  . Cough with hemoptysis 06/09/2019  . Acute on chronic combined systolic and diastolic CHF (congestive heart failure) ([place]) 05/27/2019  . Insomnia 05/07/2019  . Hypokalemia 03/23/2019  . Hyperglycemia, drug-induced 03/23/2019  . Covid-19 Virus not Detected 03/23/2019  . Respiratory failure ([place]) 03/16/2019  . AVM (arteriovenous malformation) of small bowel, acquired   . Anemia, iron deficiency 11/10/2018  . Chronic systolic heart failure ([place]) 11/06/2018  . Rectal polyp   . Benign neoplasm of cecum   . Barrett's esophagus without dysplasia   . Stomach irritation   . Chronic diastolic heart failure ([place]) 07/03/2018  . HTN (hypertension) 07/03/2018  . COPD with emphysema ([place]) 06/28/2018  . B12 deficiency anemia 06/28/2018  . Hypotension 05/11/2018  . Prediabetes 05/11/2018  . Personal history of colon cancer   . Benign neoplasm of descending colon   . Polyp of sigmoid colon   . Benign neoplasm of transverse colon   . Diverticulosis of large intestine without diverticulitis   . 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 Renal artery stenosis ([place]) 10/03/2016  . 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 Hospital discharge follow-up 02/09/2015  .  Cough in adult 10/21/2014  . Tobacco abuse 10/11/2014  . Tobacco abuse counseling 10/11/2014  . Chronic right hip pain 08/26/2014  . Atherosclerosis of native artery of extremity with intermittent claudication ([place]) 06/18/2013  . Preoperative evaluation to rule out surgical contraindication 06/18/2013  . CAD (coronary artery disease) 06/01/2013  . GERD (gastroesophageal reflux disease) 06/01/2013  . Hypercholesterolemia 06/01/2013  . Tubular adenoma of colon 06/01/2013  . 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 Smoking status: Former Smoker    Packs/day: 0.25    Types: Cigarettes    Quit date: 06/19/2019    Years since quitting: 0.0  . Smokeless tobacco: Never Used  . Tobacco comment: reports smoking 2-3 cigarettes/ day  Substance Use Topics  . Alcohol use: Yes    Alcohol/week: 2.0 standard drinks    Types: 2 Shots of liquor per week     Review of Systems  Constitutional: Negative for chills, fatigue and fever.  HENT: Negative for congestion, ear pain, sinus pain and sore throat.   Eyes: Negative.   Respiratory: Negative for cough, shortness of breath and wheezing.   Cardiovascular: Negative for chest pain, palpitations and leg swelling.  Gastrointestinal: Negative for abdominal pain, diarrhea, nausea and vomiting.  Genitourinary: Negative for dysuria, frequency and urgency.  Musculoskeletal: +pain in left foot/ankle Skin: Negative for color change, pallor and rash.  Neurological: Negative for syncope, light-headedness and headaches.  Psychiatric/Behavioral: The patient is not nervous/anxious.  Objective:   Physical Exam Vitals signs and nursing note reviewed.  Constitutional:      General: She is not in acute distress.    Appearance: She is not toxic-appearing.  HENT:     Head: Normocephalic and atraumatic.  Eyes:     General: No scleral icterus.    Extraocular Movements: Extraocular movements intact.      Pupils: Pupils are equal, round, and reactive to light.  Cardiovascular:     Rate and Rhythm: Normal rate and regular rhythm.     Heart sounds: Normal heart sounds.  Musculoskeletal:        General: Tenderness (left great toe, top of left foot and left ankle) present.     Comments: No pitting edema. Left ankle appears slightly swollen when compared to right, but difference is minimal.  No swelling of calves. No other redness of LEs.  Skin:    General: Skin is warm and dry.     Findings: Erythema (redness left great toe) present.  Neurological:     General: No focal deficit present.     Mental Status: She is alert and oriented to person, place, and time.     Comments: Walks with limp to favor left foot/ankle  Psychiatric:        Mood and Affect: Mood normal.        Behavior: Behavior normal.     Today's Vitals   07/03/19 1508  BP: 132/78  Pulse: 79  Temp: (!) 96.9 F (36.1 C)  SpO2: 95%  Weight: 169 lb (76.7 kg)   Body mass index is 29.94 kg/m.     Assessment & Plan:    Acute gout-suspect patient's pain in left foot/left ankle is related to a gout flareup.  She currently does not have medication prescription insurance coverage, so we will do prednisone taper as this will be inexpensive for patient.  Also advised to keep foot elevated and can use warm pack on foot and ankle to help reduce pain.  Advised if pain worsens in any way, develops more swelling and redness to let us know immediately.  Discussed differential diagnosis of the DVT, patient states she is on her Plavix every day and does not have concern for DVT, she would prefer not to have to go through imaging at this time if she does not have to.  Advised patient if her symptoms worsen rather than improve with current treatment plan, then we must rule out DVT at that time patient verbalizes understanding.  She will keep all regular follow-up with PCP and specialist as planned.  She will return to clinic anytime if issues  arise.  She will let us know if she is not improving.

## 2019-07-03 NOTE — Patient Instructions (Signed)
Visit Information  Goals Addressed            This Visit's Progress     Patient Stated   . "I can't afford my medications and I'm out" (pt-stated)       Current Barriers:  . Financial concerns - patient notes that she was unable to afford her Medicare Part D premium, so lost coverage. [facility] helped her apply for L[facility], she has been approved for this. She notes that she called Medicare about 2 days after receiving the letter in order to restart a Medicare plan, but the LIS approval was not in their system yet.  [name] Spiriva - [facility] did not have any LAMA when we first investigated. Decided to apply for patient assistance, but patient has not had a chance to sign application yet. Our clinic had no LAMA samples, and she has not established with pulmonary yet. [place] drug rep - samples should be shipping to clinic in the next 1-3 business days . Today, notes she is 4lb up from yesterday (166->170 lbs). Notes increased SOB, however, also attributes this to pain. She believes she has gout in her L foot. She first reported this to [facility] on 8/25, who recommended she contact her PCP; however, patient notes the pain improved the next day, but has gotten worse.  . Patient notes she has not had a chance to call Medicare back to restart insurance coverage since receiving LIS approval  Pharmacist Clinical Goal(s):  [name] Over the next 30 days, patient will work with PharmD to address needs related to medication access  Interventions: . Spoke with Dr. Derrel Nip; she recommended patient be seen by a provider today. CMA will contact patient to schedule appointment regarding gout and fluid.  Kandice Robinsons pharmacists at [facility] to see if they had any LAMA in stock (Spiriva or Incruse) so that patient can resume triple therapy to reduce risk of subsequent hospitalization for exacerbation. Spiriva samples should be arriving to clinic in the  next 1-3 days, and clinic staff will alert me when they arrive . Reiterated the importance of contacting Medicare to restart her insurance coverage, particularly drug coverage. She verbalized understanding  . Reiterated that I need her signature on Spiriva patient assistance application, as well as proof of income. She notes that she will bring that when she comes to the office.  Patient Self Care Activities:  . Self administers medications as prescribed . Calls pharmacy for medication refills  Please see past updates related to this goal by clicking on the "Past Updates" button in the selected goal         The patient verbalized understanding of instructions provided today and declined a print copy of patient instruction materials.   Plan:  - Will continue to collaborate with PCP and RN CM Reginia Naas to provide comprehensive care to this patient.  - Will outreach patient in 2-4 weeks for continued medication management support  [name] Darnelle Maffucci, PharmD, [place] Pharmacist [facility] [place] [phone]

## 2019-07-03 NOTE — Addendum Note (Signed)
Addended by: De Hollingshead on: 07/03/2019 03:07 PM   Modules accepted: Orders

## 2019-07-03 NOTE — Chronic Care Management (AMB) (Addendum)
Chronic Care Management   Follow Up Note   07/03/2019 Name: Ashley [name] MRN: [id number] DOB: 10-16-1951  Referred by: [name], MD Reason for referral : Chronic Care Management (Medication Management)   Ashley [name] is a 68 y.o. year old female who is a primary care patient of [name], [name], MD. The CCM team was consulted for assistance with chronic disease management and care coordination needs.    Contacted patient for medication management support.  Review of patient status, including review of consultants reports, relevant laboratory and other test results, and collaboration with appropriate care team members and the patient's provider was performed as part of comprehensive patient evaluation and provision of chronic care management services.    SDOH (Social Determinants of Health) screening performed today: Financial Strain  Tobacco Use. See Care Plan for related entries.   Outpatient Encounter Medications as of 07/03/2019  Medication Sig Note  . acetaminophen (TYLENOL) 500 MG tablet Maximum 6 tablets a day. (Patient taking differently: Take 500 mg by mouth every 4 (four) hours as needed for fever. Maximum 6 tablets a day.)   . albuterol (VENTOLIN HFA) 108 (90 Base) MCG/ACT inhaler INHALE 2 PUFFS BY MOUTH INTO THE LUNGS EVERY 6 HOURS AS NEEDED FOR WHEEZING 07/03/2019: Using 1-2 times daily currently  . ALPRAZolam (XANAX) 0.5 MG tablet Take 0.5-1 tablets (0.25-0.5 mg total) by mouth at bedtime. TAKE ONE TABLET BY MOUTH AT BEDTIME AS NEEDED FOR ANXIETY   . aspirin 81 MG tablet Take 81 mg by mouth daily.   [name] atorvastatin (LIPITOR) 40 MG tablet Take 1 tablet (40 mg total) by mouth daily.   . carvedilol (COREG) 6.25 MG tablet TAKE ONE TABLET BY MOUTH TWICE DAILY WITH MEAL   . clopidogrel (PLAVIX) 75 MG tablet Take 1 tablet (75 mg total) by mouth daily.   . cyanocobalamin (,VITAMIN B-12,) 1000 MCG/ML injection Inject 1 mL weekly into the muscle monthly   . escitalopram  (LEXAPRO) 10 MG tablet Take 1 tablet (10 mg total) by mouth daily.   . ferrous sulfate 325 (65 FE) MG EC tablet Take 325 mg by mouth daily with breakfast.   . fluticasone furoate-vilanterol (BREO ELLIPTA) 100-25 MCG/INH AEPB Inhale 1 puff into the lungs daily.   . furosemide (LASIX) 40 MG tablet One tablet twice a dya for five days then can cut back to once a day   . ipratropium-albuterol (DUONEB) 0.5-2.5 (3) MG/3ML SOLN Take 3 mLs by nebulization every 6 (six) hours as needed. 07/03/2019: Using 1-2 times daily currently  . omeprazole (PRILOSEC) 40 MG capsule Take 1 capsule (40 mg total) by mouth daily.   . potassium chloride (K-DUR) 10 MEQ tablet Take 1 tablet (10 mEq total) by mouth daily.   . sacubitril-valsartan (ENTRESTO) 24-26 MG Take 1 tablet by mouth 2 (two) times daily.   [name] spironolactone (ALDACTONE) 25 MG tablet Take 0.5 tablets (12.5 mg total) by mouth daily.   . traZODone (DESYREL) 50 MG tablet Take 0.5-1 tablets (25-50 mg total) by mouth daily. One hour before bedtime   . diphenhydrAMINE (DIPHENHIST) 25 mg capsule Take 25 mg by mouth at bedtime as needed.    . nitroGLYCERIN (NITROSTAT) 0.4 MG SL tablet Place 1 tablet (0.4 mg total) under the tongue every 5 (five) minutes as needed for chest pain. (Patient not taking: Reported on 07/03/2019) 07/01/2019: Patient reports has not needed recently  . Tiotropium Bromide Monohydrate (SPIRIVA RESPIMAT) 2.5 MCG/ACT AERS Inhale 5 mcg into the lungs every morning. (  Patient not taking: Reported on 06/17/2019) 06/17/2019: Working with [place] on financial assistance   No facility-administered encounter medications on file as of 07/03/2019.      Goals Addressed            This Visit's Progress     Patient Stated   . "I can't afford my medications and I'm out" (pt-stated)       Current Barriers:  . Financial concerns - patient notes that she was unable to afford her Medicare Part D premium, so lost coverage. [facility]  helped her apply for LIS, she has been approved for this. She notes that she called Medicare about 2 days after receiving the letter in order to restart a Medicare plan, but the LIS approval was not in their system yet.  [name] Spir[facility] - [facility] did not have any LAMA when we first investigated. Decided to apply for patient assistance, but patient has not had a chance to sign application yet. Our clinic had no LAMA samples, and she has not established with pulmonary yet. [place] drug rep - samples should be shipping to clinic in the next 1-3 business days . Today, notes she is 4lb up from yesterday (166->170 lbs). Notes increased SOB, however, also attributes this to pain. She believes she has gout in her L foot. She first reported this to [facility] on 8/25, who recommended she contact her PCP; however, patient notes the pain improved the next day, but has gotten worse.  . Patient notes she has not had a chance to call Medicare back to restart insurance coverage since receiving LIS approval  Pharmacist Clinical Goal(s):  [name] Over the next 30 days, patient will work with PharmD to address needs related to medication access  Interventions: . Spoke with Dr. Derrel Nip; she recommended patient be seen by a provider today. CMA will contact patient to schedule appointment regarding gout and fluid.  Kandice Robinsons pharmacists at [facility] to see if they had any LAMA in stock (Spiriva or Incruse) so that patient can resume triple therapy to reduce risk of subsequent hospitalization for exacerbation. Received message back; they have a 1 month supply of Spiriva.  Sending to [facility] today for patient.  [name] Spiriva samples should be arriving to clinic in the next 1-3 days, and clinic staff will alert me when they arrive . Reiterated the importance of contacting Medicare to restart her insurance coverage, particularly drug coverage. She verbalized understanding   . Reiterated that I need her signature on Spiriva patient assistance application, as well as proof of income. She notes that she will bring that when she comes to the office.  Patient Self Care Activities:  . Self administers medications as prescribed . Calls pharmacy for medication refills  Please see past updates related to this goal by clicking on the "Past Updates" button in the selected goal          Plan:  - Will continue to collaborate with PCP and RN CM Reginia Naas to provide comprehensive care to this patient.  - Will outreach patient in 2-4 weeks for continued medication management support  [name] Darnelle Maffucci, PharmD, [place] Pharmacist [facility] [phone]

## 2019-07-04 ENCOUNTER — Other Ambulatory Visit: Payer: Self-pay | Admitting: *Deleted

## 2019-07-04 NOTE — Patient Outreach (Signed)
[facility]) Care Management [facility] CM Multidisciplinary case Conference    07/04/2019  Ashley [name] [birth year] [id number]   [facility] CM Multidisciplinary Case Discussion re:  Ashley [name], 68 y/o femalereferred to [place] by [facility] RN CM after recent hospitalization July 21-25, 2020 for acute on chronic CHF exacerbation.Patientwas discharged from hospital to home/ self-care without home health services in place. Patient has history including, but not limited to, combined CHF; CAD with previous MI/ CABG, and ICD; HTN/ HLD; GERD; COPD; tobacco use.  Unfortunately, patient experienced hospital readmission 18 days after her hospital discharge, August 13-15, 2020 for acute respiratory failure with hypoxia; patient was again discharged home without home health services in place.  Barriers:  -- Extensive clinical history: cardiac and respiratory; overall fragile with occasional- intermittent episodes of extreme overnight weight gain > 3 lbs -- Ongoing smoking-- patient reports has recently quit over last 2 weeks after last hospital discharge -- Need for Medication Assistance- [facility] CM Pharmacist [name] Darnelle Maffucci involved/ addressing -- questionable issues around patent's perception of health status; may not be based in reality; patient not proactive in addressing acute issues; has not followed up on requests for follow up needed by her to address her medication concerns -- good family support overall; family members have now taken their own smoking outside to help patient remain smoke free  [facility] CM Team Discussion/ plan:  -- continue efforts to encourage patient to remain smoke free: collaborate with providers/ new pulmonary referral (pending) -- engage patient's fiancee/ family in efforts to partner with patient and also quit smoking -- explore nutritional habits/ practices more thoroughly with ongoing outreaches -- resubmit to [facility] case discussion as  indicated  [name], RN, BSN, [employer] Coordinator [facility] Care Management  [phone]

## 2019-07-16 ENCOUNTER — Emergency Department: Payer: Medicare Other

## 2019-07-16 ENCOUNTER — Inpatient Hospital Stay
Admission: EM | Admit: 2019-07-16 | Discharge: 2019-07-19 | DRG: 291 | Disposition: A | Discharge status: Home / Self Care | Payer: Medicare Other | Attending: Internal Medicine | Admitting: Internal Medicine

## 2019-07-16 ENCOUNTER — Other Ambulatory Visit: Payer: Self-pay

## 2019-07-16 DIAGNOSIS — I509 Heart failure, unspecified: Secondary | ICD-10-CM | POA: Diagnosis not present

## 2019-07-16 DIAGNOSIS — J810 Acute pulmonary edema: Secondary | ICD-10-CM | POA: Diagnosis not present

## 2019-07-16 DIAGNOSIS — I5023 Acute on chronic systolic (congestive) heart failure: Secondary | ICD-10-CM | POA: Diagnosis present

## 2019-07-16 DIAGNOSIS — I130 Hypertensive heart and chronic kidney disease with heart failure and stage 1 through stage 4 chronic kidney disease, or unspecified chronic kidney disease: Principal | ICD-10-CM | POA: Diagnosis present

## 2019-07-16 DIAGNOSIS — Z20828 Contact with and (suspected) exposure to other viral communicable diseases: Secondary | ICD-10-CM | POA: Diagnosis present

## 2019-07-16 DIAGNOSIS — I739 Peripheral vascular disease, unspecified: Secondary | ICD-10-CM | POA: Diagnosis present

## 2019-07-16 DIAGNOSIS — N183 Chronic kidney disease, stage 3 (moderate): Secondary | ICD-10-CM | POA: Diagnosis present

## 2019-07-16 DIAGNOSIS — R0602 Shortness of breath: Secondary | ICD-10-CM | POA: Diagnosis not present

## 2019-07-16 DIAGNOSIS — F17210 Nicotine dependence, cigarettes, uncomplicated: Secondary | ICD-10-CM | POA: Diagnosis present

## 2019-07-16 DIAGNOSIS — Z79899 Other long term (current) drug therapy: Secondary | ICD-10-CM

## 2019-07-16 DIAGNOSIS — Z85038 Personal history of other malignant neoplasm of large intestine: Secondary | ICD-10-CM | POA: Diagnosis not present

## 2019-07-16 DIAGNOSIS — Z833 Family history of diabetes mellitus: Secondary | ICD-10-CM

## 2019-07-16 DIAGNOSIS — J449 Chronic obstructive pulmonary disease, unspecified: Secondary | ICD-10-CM | POA: Diagnosis present

## 2019-07-16 DIAGNOSIS — E785 Hyperlipidemia, unspecified: Secondary | ICD-10-CM | POA: Diagnosis present

## 2019-07-16 DIAGNOSIS — Z951 Presence of aortocoronary bypass graft: Secondary | ICD-10-CM

## 2019-07-16 DIAGNOSIS — I2510 Atherosclerotic heart disease of native coronary artery without angina pectoris: Secondary | ICD-10-CM | POA: Diagnosis present

## 2019-07-16 DIAGNOSIS — Z7902 Long term (current) use of antithrombotics/antiplatelets: Secondary | ICD-10-CM | POA: Diagnosis not present

## 2019-07-16 DIAGNOSIS — J9601 Acute respiratory failure with hypoxia: Secondary | ICD-10-CM | POA: Diagnosis present

## 2019-07-16 DIAGNOSIS — Z8249 Family history of ischemic heart disease and other diseases of the circulatory system: Secondary | ICD-10-CM

## 2019-07-16 DIAGNOSIS — R000 Tachycardia, unspecified: Secondary | ICD-10-CM | POA: Diagnosis not present

## 2019-07-16 DIAGNOSIS — Z95810 Presence of automatic (implantable) cardiac defibrillator: Secondary | ICD-10-CM | POA: Diagnosis not present

## 2019-07-16 DIAGNOSIS — I252 Old myocardial infarction: Secondary | ICD-10-CM

## 2019-07-16 DIAGNOSIS — R0689 Other abnormalities of breathing: Secondary | ICD-10-CM | POA: Diagnosis not present

## 2019-07-16 DIAGNOSIS — Z7982 Long term (current) use of aspirin: Secondary | ICD-10-CM

## 2019-07-16 DIAGNOSIS — Z7952 Long term (current) use of systemic steroids: Secondary | ICD-10-CM | POA: Diagnosis not present

## 2019-07-16 DIAGNOSIS — K219 Gastro-esophageal reflux disease without esophagitis: Secondary | ICD-10-CM | POA: Diagnosis present

## 2019-07-16 DIAGNOSIS — J439 Emphysema, unspecified: Secondary | ICD-10-CM | POA: Diagnosis present

## 2019-07-16 DIAGNOSIS — R042 Hemoptysis: Secondary | ICD-10-CM | POA: Diagnosis present

## 2019-07-16 DIAGNOSIS — R069 Unspecified abnormalities of breathing: Secondary | ICD-10-CM | POA: Diagnosis not present

## 2019-07-16 DIAGNOSIS — J441 Chronic obstructive pulmonary disease with (acute) exacerbation: Secondary | ICD-10-CM | POA: Diagnosis not present

## 2019-07-16 LAB — CBC
HCT: 36 % (ref 36.0–46.0)
Hemoglobin: 11.5 g/dL — ABNORMAL LOW (ref 12.0–15.0)
MCH: 32.4 pg (ref 26.0–34.0)
MCHC: 31.9 g/dL (ref 30.0–36.0)
MCV: 101.4 fL — ABNORMAL HIGH (ref 80.0–100.0)
Platelets: 522 10*3/uL — ABNORMAL HIGH (ref 150–400)
RBC: 3.55 MIL/uL — ABNORMAL LOW (ref 3.87–5.11)
RDW: 14.4 % (ref 11.5–15.5)
WBC: 14.4 10*3/uL — ABNORMAL HIGH (ref 4.0–10.5)
nRBC: 0 % (ref 0.0–0.2)

## 2019-07-16 LAB — BASIC METABOLIC PANEL
Anion gap: 15 (ref 5–15)
BUN: 15 mg/dL (ref 8–23)
CO2: 18 mmol/L — ABNORMAL LOW (ref 22–32)
Calcium: 9.3 mg/dL (ref 8.9–10.3)
Chloride: 105 mmol/L (ref 98–111)
Creatinine, Ser: 1.14 mg/dL — ABNORMAL HIGH (ref 0.44–1.00)
GFR calc Af Amer: 58 mL/min — ABNORMAL LOW (ref >60)
GFR calc non Af Amer: 50 mL/min — ABNORMAL LOW (ref >60)
Glucose, Bld: 341 mg/dL — ABNORMAL HIGH (ref 70–99)
Potassium: 5.1 mmol/L (ref 3.5–5.1)
Sodium: 138 mmol/L (ref 135–145)

## 2019-07-16 LAB — BLOOD GAS, VENOUS
Acid-base deficit: 6.7 mmol/L — ABNORMAL HIGH (ref 0.0–2.0)
Bicarbonate: 19.2 mmol/L — ABNORMAL LOW (ref 20.0–28.0)
Delivery systems: POSITIVE
FIO2: 0.4
O2 Saturation: 96 %
Patient temperature: 37
pCO2, Ven: 39 mmHg — ABNORMAL LOW (ref 44.0–60.0)
pH, Ven: 7.3 (ref 7.250–7.430)
pO2, Ven: 90 mmHg — ABNORMAL HIGH (ref 32.0–45.0)

## 2019-07-16 LAB — TROPONIN I (HIGH SENSITIVITY)
Troponin I (High Sensitivity): 33 ng/L — ABNORMAL HIGH (ref <18)
Troponin I (High Sensitivity): 30 ng/L — ABNORMAL HIGH (ref <18)

## 2019-07-16 LAB — BRAIN NATRIURETIC PEPTIDE: B Natriuretic Peptide: >4500 pg/mL — ABNORMAL HIGH (ref 0.0–100.0)

## 2019-07-16 LAB — SARS CORONAVIRUS 2 BY RT PCR (HOSPITAL ORDER, PERFORMED IN ~~LOC~~ HOSPITAL LAB): SARS Coronavirus 2: NEGATIVE

## 2019-07-16 MED ORDER — ALBUTEROL SULFATE (2.5 MG/3ML) 0.083% IN NEBU: 2.5000 mg | INHALATION_SOLUTION | RESPIRATORY_TRACT | Status: DC | PRN

## 2019-07-16 MED ORDER — ASPIRIN EC 81 MG PO TBEC
81.0000 mg | DELAYED_RELEASE_TABLET | Freq: Every day | ORAL | Status: DC
  Administered 2019-07-16 – 2019-07-19 (×4): 81 mg via ORAL
  Filled 2019-07-16 (×4): qty 1

## 2019-07-16 MED ORDER — CARVEDILOL 3.125 MG PO TABS
3.1250 mg | ORAL_TABLET | Freq: Two times a day (BID) | ORAL | Status: DC
  Administered 2019-07-16 – 2019-07-19 (×5): 3.125 mg via ORAL
  Filled 2019-07-16 (×6): qty 1

## 2019-07-16 MED ORDER — LORAZEPAM 2 MG/ML IJ SOLN
0.5000 mg | Freq: Once | INTRAMUSCULAR | Status: AC
  Administered 2019-07-16: 0.5 mg via INTRAVENOUS
  Filled 2019-07-16: qty 1

## 2019-07-16 MED ORDER — FUROSEMIDE 10 MG/ML IJ SOLN
40.0000 mg | Freq: Once | INTRAMUSCULAR | Status: AC
  Administered 2019-07-16: 40 mg via INTRAVENOUS
  Filled 2019-07-16: qty 4

## 2019-07-16 MED ORDER — IPRATROPIUM-ALBUTEROL 0.5-2.5 (3) MG/3ML IN SOLN
3.0000 mL | Freq: Once | RESPIRATORY_TRACT | Status: AC
  Administered 2019-07-16: 12:00:00 3 mL via RESPIRATORY_TRACT

## 2019-07-16 MED ORDER — SODIUM CHLORIDE 0.9% FLUSH
3.0000 mL | Freq: Two times a day (BID) | INTRAVENOUS | Status: DC
  Administered 2019-07-16 (×2): 3 mL via INTRAVENOUS
  Administered 2019-07-17: 08:00:00 via INTRAVENOUS
  Administered 2019-07-17 – 2019-07-19 (×4): 3 mL via INTRAVENOUS

## 2019-07-16 MED ORDER — ESCITALOPRAM OXALATE 10 MG PO TABS
10.0000 mg | ORAL_TABLET | Freq: Every day | ORAL | Status: DC
  Administered 2019-07-16 – 2019-07-19 (×4): 10 mg via ORAL
  Filled 2019-07-16 (×4): qty 1

## 2019-07-16 MED ORDER — FERROUS SULFATE 325 (65 FE) MG PO TABS
325.0000 mg | ORAL_TABLET | Freq: Every day | ORAL | Status: DC
  Administered 2019-07-17 – 2019-07-19 (×3): 325 mg via ORAL
  Filled 2019-07-16 (×3): qty 1

## 2019-07-16 MED ORDER — ALPRAZOLAM 0.5 MG PO TABS
0.2500 mg | ORAL_TABLET | Freq: Every day | ORAL | Status: DC
  Administered 2019-07-16 – 2019-07-18 (×3): 0.5 mg via ORAL
  Filled 2019-07-16 (×3): qty 1

## 2019-07-16 MED ORDER — TIOTROPIUM BROMIDE MONOHYDRATE 2.5 MCG/ACT IN AERS: 5.0000 ug | INHALATION_SPRAY | Freq: Every morning | RESPIRATORY_TRACT | Status: DC

## 2019-07-16 MED ORDER — TIOTROPIUM BROMIDE MONOHYDRATE 18 MCG IN CAPS
18.0000 ug | ORAL_CAPSULE | Freq: Every day | RESPIRATORY_TRACT | Status: DC
  Administered 2019-07-16 – 2019-07-19 (×3): 18 ug via RESPIRATORY_TRACT
  Filled 2019-07-16: qty 5

## 2019-07-16 MED ORDER — TRAZODONE HCL 50 MG PO TABS
50.0000 mg | ORAL_TABLET | Freq: Every day | ORAL | Status: DC
  Administered 2019-07-16 – 2019-07-18 (×3): 50 mg via ORAL
  Filled 2019-07-16 (×3): qty 1

## 2019-07-16 MED ORDER — SACUBITRIL-VALSARTAN 24-26 MG PO TABS
1.0000 | ORAL_TABLET | Freq: Two times a day (BID) | ORAL | Status: DC
  Administered 2019-07-16 – 2019-07-19 (×6): 1 via ORAL
  Filled 2019-07-16 (×6): qty 1

## 2019-07-16 MED ORDER — NITROGLYCERIN 2 % TD OINT
0.5000 [in_us] | TOPICAL_OINTMENT | Freq: Four times a day (QID) | TRANSDERMAL | Status: DC
  Administered 2019-07-16 – 2019-07-17 (×5): 0.5 [in_us] via TOPICAL
  Filled 2019-07-16 (×4): qty 1

## 2019-07-16 MED ORDER — ENOXAPARIN SODIUM 40 MG/0.4ML ~~LOC~~ SOLN
40.0000 mg | SUBCUTANEOUS | Status: DC
  Administered 2019-07-16 – 2019-07-18 (×3): 40 mg via SUBCUTANEOUS
  Filled 2019-07-16 (×3): qty 0.4

## 2019-07-16 MED ORDER — NITROGLYCERIN 0.4 MG SL SUBL: 0.4000 mg | SUBLINGUAL_TABLET | SUBLINGUAL | Status: DC | PRN

## 2019-07-16 MED ORDER — IPRATROPIUM-ALBUTEROL 0.5-2.5 (3) MG/3ML IN SOLN
RESPIRATORY_TRACT | Status: AC
  Administered 2019-07-16: 3 mL
  Filled 2019-07-16: qty 6

## 2019-07-16 MED ORDER — ONDANSETRON HCL 4 MG/2ML IJ SOLN: 4.0000 mg | Freq: Four times a day (QID) | INTRAMUSCULAR | Status: DC | PRN

## 2019-07-16 MED ORDER — ONDANSETRON HCL 4 MG PO TABS: 4.0000 mg | ORAL_TABLET | Freq: Four times a day (QID) | ORAL | Status: DC | PRN

## 2019-07-16 MED ORDER — POLYETHYLENE GLYCOL 3350 17 G PO PACK: 17.0000 g | PACK | Freq: Every day | ORAL | Status: DC | PRN

## 2019-07-16 MED ORDER — CLOPIDOGREL BISULFATE 75 MG PO TABS
75.0000 mg | ORAL_TABLET | Freq: Every day | ORAL | Status: DC
  Administered 2019-07-16 – 2019-07-19 (×4): 75 mg via ORAL
  Filled 2019-07-16 (×4): qty 1

## 2019-07-16 MED ORDER — MAGNESIUM SULFATE 2 GM/50ML IV SOLN
2.0000 g | Freq: Once | INTRAVENOUS | Status: AC
  Administered 2019-07-16: 13:00:00 2 g via INTRAVENOUS
  Filled 2019-07-16: qty 50

## 2019-07-16 MED ORDER — FUROSEMIDE 10 MG/ML IJ SOLN
60.0000 mg | Freq: Two times a day (BID) | INTRAMUSCULAR | Status: DC
  Administered 2019-07-16 – 2019-07-19 (×6): 60 mg via INTRAVENOUS
  Filled 2019-07-16 (×6): qty 6

## 2019-07-16 MED ORDER — ACETAMINOPHEN 325 MG PO TABS: 650.0000 mg | ORAL_TABLET | Freq: Four times a day (QID) | ORAL | Status: DC | PRN

## 2019-07-16 MED ORDER — PANTOPRAZOLE SODIUM 40 MG PO TBEC
40.0000 mg | DELAYED_RELEASE_TABLET | Freq: Every day | ORAL | Status: DC
  Administered 2019-07-17 – 2019-07-19 (×3): 40 mg via ORAL
  Filled 2019-07-16 (×3): qty 1

## 2019-07-16 MED ORDER — ATORVASTATIN CALCIUM 20 MG PO TABS
40.0000 mg | ORAL_TABLET | Freq: Every day | ORAL | Status: DC
  Administered 2019-07-17 – 2019-07-19 (×3): 40 mg via ORAL
  Filled 2019-07-16 (×3): qty 2

## 2019-07-16 MED ORDER — ACETAMINOPHEN 650 MG RE SUPP: 650.0000 mg | Freq: Four times a day (QID) | RECTAL | Status: DC | PRN

## 2019-07-16 NOTE — ED Provider Notes (Signed)
[facility] Emergency Department Provider Note   ____________________________________________    I have reviewed the triage vital signs and the nursing notes.   HISTORY  Chief Complaint Respiratory Distress     HPI Ashley [name] is a 68 y.o. female with a history of COPD and CHF who presents with shortness of breath.  Patient is in significant respiratory distress, unable to provide significant history at this time.  Reportedly started having shortness of breath over the last 2 days, worsening significantly today.  She denies fevers or cough.  EMS placed the patient on BiPAP for hypoxia and work of breathing  Past Medical History:  Diagnosis Date  . AICD (automatic cardioverter/defibrillator) present    on right side  . Anemia   . CAD (coronary artery disease)    s/p CABG  . CHF (congestive heart failure) ([place])   . Chronic kidney disease    renal artery stenosis  . Colon cancer ([place])   . Depression   . GERD (gastroesophageal reflux disease)   . Hx of colonic polyps   . Hyperlipidemia   . Hypertension   . Mitral valve disorder    s/p mitral valve repair wth CABG  . Myocardial infarction ([place])   . Peripheral vascular disease ([place])   . Presence of permanent cardiac pacemaker    Pacemaker/ Defibrillator    Patient Active Problem List   Diagnosis Date Noted  . Acute respiratory failure with hypoxia ([place]) 06/19/2019  . Cough with hemoptysis 06/09/2019  . Acute on chronic combined systolic and diastolic CHF (congestive heart failure) ([place]) 05/27/2019  . Insomnia 05/07/2019  . Hypokalemia 03/23/2019  . Hyperglycemia, drug-induced 03/23/2019  . Covid-19 Virus not Detected 03/23/2019  . Respiratory failure ([place]) 03/16/2019  . AVM (arteriovenous malformation) of small bowel, acquired   . Anemia, iron deficiency 11/10/2018  . Chronic systolic heart failure ([place]) 11/06/2018  . Rectal polyp   . Benign neoplasm of cecum   . Barrett's  esophagus without dysplasia   . Stomach irritation   . Chronic diastolic heart failure ([place]) 07/03/2018  . HTN (hypertension) 07/03/2018  . 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 B12 deficiency anemia 06/28/2018  . Hypotension 05/11/2018  . Prediabetes 05/11/2018  . Personal history of colon cancer   . Benign neoplasm of descending colon   . Polyp of sigmoid colon   . Benign neoplasm of transverse colon   . Diverticulosis of large intestine without diverticulitis   . 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 Renal artery stenosis ([place]) 10/03/2016  . 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 Hospital discharge follow-up 02/09/2015  . Cough in adult 10/21/2014  . Tobacco abuse 10/11/2014  . Tobacco abuse counseling 10/11/2014  . Chronic right hip pain 08/26/2014  . Atherosclerosis of native artery of extremity with intermittent claudication ([place]) 06/18/2013  . Preoperative evaluation to rule out surgical contraindication 06/18/2013  . CAD (coronary artery disease) 06/01/2013  . GERD (gastroesophageal reflux disease) 06/01/2013  . Hypercholesterolemia 06/01/2013  . Tubular adenoma of colon 06/01/2013  . 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 arm surgery     fracture, has plates and screws  . CABG with mitral valve repair    . CARDIAC CATHETERIZATION    . COLON SURGERY     colon cancer  . COLONOSCOPY WITH PROPOFOL N/A 10/09/2016   Procedure: COLONOSCOPY WITH PROPOFOL;  Surgeon: Jonathon Bellows, MD;  Location: [facility] ENDOSCOPY;  Service: Endoscopy;  Laterality: N/A;  . COLONOSCOPY WITH PROPOFOL N/A 07/24/2018   Procedure: COLONOSCOPY WITH PROPOFOL;  Surgeon: Virgel Manifold, MD;  Location: [facility] ENDOSCOPY;  Service: Endoscopy;  Laterality: N/A;  . CORONARY ARTERY BYPASS GRAFT    . ENTEROSCOPY N/A 11/21/2018   Procedure: ENTEROSCOPY-BALLOON;  Surgeon: Jonathon Bellows, MD;  Location:  [facility] ENDOSCOPY;  Service: Gastroenterology;  Laterality: N/A;  . ESOPHAGOGASTRODUODENOSCOPY (EGD) WITH PROPOFOL N/A 07/24/2018   Procedure: ESOPHAGOGASTRODUODENOSCOPY (EGD) WITH PROPOFOL;  Surgeon: Virgel Manifold, MD;  Location: [facility] ENDOSCOPY;  Service: Endoscopy;  Laterality: N/A;  . GIVENS CAPSULE STUDY N/A 08/16/2018   Procedure: GIVENS CAPSULE STUDY;  Surgeon: Virgel Manifold, MD;  Location: [facility] ENDOSCOPY;  Service: Endoscopy;  Laterality: N/A;  . pace maker defib  2016  . PERIPHERAL VASCULAR CATHETERIZATION N/A 10/23/2016   Procedure: Renal Angiography;  Surgeon: Algernon Huxley, MD;  Location: [place] [facility];  Service: Cardiovascular;  Laterality: N/A;  . TOTAL ABDOMINAL HYSTERECTOMY  1999   history of abnormal pap    Prior to Admission medications   Medication Sig Start Date End Date Taking? Authorizing Provider  acetaminophen (TYLENOL) 500 MG tablet Maximum 6 tablets a day. Patient taking differently: Take 500 mg by mouth every 4 (four) hours as needed for fever. Maximum 6 tablets a day. 02/09/15   Rubbie Battiest, RN  albuterol (VENTOLIN HFA) 108 (90 Base) MCG/ACT inhaler INHALE 2 PUFFS BY MOUTH INTO THE LUNGS EVERY 6 HOURS AS NEEDED FOR WHEEZING 06/03/19   [name], MD  ALPRAZolam Duanne Moron) 0.5 MG tablet Take 0.5-1 tablets (0.25-0.5 mg total) by mouth at bedtime. TAKE ONE TABLET BY MOUTH AT BEDTIME AS NEEDED FOR ANXIETY 02/24/19   [name], MD  aspirin 81 MG tablet Take 81 mg by mouth daily.    [provider]  atorvastatin (LIPITOR) 40 MG tablet Take 1 tablet (40 mg total) by mouth daily. 06/03/19   [name], MD  carvedilol (COREG) 6.25 MG tablet TAKE ONE TABLET BY MOUTH TWICE DAILY WITH MEAL 06/03/19   [name], MD  clopidogrel (PLAVIX) 75 MG tablet Take 1 tablet (75 mg total) by mouth daily. 06/03/19   [name], MD  cyanocobalamin (,VITAMIN B-12,) 1000 MCG/ML injection Inject 1 mL weekly into the muscle monthly 11/08/18   [name], MD  diphenhydrAMINE (DIPHENHIST) 25 mg capsule Take 25 mg by mouth at bedtime as needed.     [provider]  escitalopram (LEXAPRO) 10 MG tablet Take 1 tablet (10 mg total) by mouth daily. 06/03/19   [name], MD  ferrous sulfate 325 (65 FE) MG EC tablet Take 325 mg by mouth daily with breakfast.    [provider]  furosemide (LASIX) 40 MG tablet One tablet twice a dya for five days then can cut back to once a day 06/21/19   Loletha Grayer, MD  ipratropium-albuterol (DUONEB) 0.5-2.5 (3) MG/3ML SOLN Take 3 mLs by nebulization every 6 (six) hours as needed. 08/05/18   [name], MD  nitroGLYCERIN (NITROSTAT) 0.4 MG SL tablet Place 1 tablet (0.4 mg total) under the tongue every 5 (five) minutes as needed for chest pain. 06/19/16   [name], MD  omeprazole (PRILOSEC) 40 MG capsule Take 1 capsule (40 mg total) by mouth daily. 06/03/19   [name], MD  potassium chloride (K-DUR) 10 MEQ tablet Take 1 tablet (10 mEq total) by mouth daily. 06/03/19  [name], MD  predniSONE (DELTASONE) 10 MG tablet Take 6 tablets by mouth on day 1, take 5 tablets by mouth on day 2, take 4 tablets by mouth on day 3, take 3 tablets by mouth on day 4, take 2 tablets by mouth on day 5, take 1 tablet by mouth on day 6 and stop 07/03/19   Guse, Jacquelynn Cree, FNP  sacubitril-valsartan (ENTRESTO) 24-26 MG Take 1 tablet by mouth 2 (two) times daily. 06/03/19   [name], MD  spironolactone (ALDACTONE) 25 MG tablet Take 0.5 tablets (12.5 mg total) by mouth daily. 06/21/19 06/20/20  Loletha Grayer, MD  Tiotropium Bromide Monohydrate (SPIRIVA RESPIMAT) 2.5 MCG/ACT AERS Inhale 5 mcg into the lungs every morning. 07/03/19   [name], MD  traZODone (DESYREL) 50 MG tablet Take 0.5-1 tablets (25-50 mg total) by mouth daily. One hour before bedtime 06/03/19   [name], MD     Allergies Hydrocodone-acetaminophen  Family History  Problem Relation Age of Onset  . Arthritis  Mother   . Cancer Mother        uterus cancer  . Hyperlipidemia Mother   . Hypertension Mother   . Heart disease Mother   . Diabetes Mother   . Hyperlipidemia Father   . Hypertension Father   . Heart disease Father   . Diabetes Father   . Cancer Sister        ovary cancer  . Diabetes Maternal Grandmother   . Hypertension Maternal Grandmother   . Arthritis Maternal Grandmother   . Hypertension Maternal Grandfather   . Hypertension Paternal Grandmother   . Hypertension Paternal Grandfather   . Heart disease Paternal Grandfather   . Heart disease Brother   . Kidney disease Brother   . Breast cancer Neg Hx     Social History Social History   Tobacco Use  . Smoking status: Former Smoker    Packs/day: 0.25    Types: Cigarettes    Quit date: 06/19/2019    Years since quitting: 0.0  . Smokeless tobacco: Never Used  . Tobacco comment: reports smoking 2-3 cigarettes/ day  Substance Use Topics  . Alcohol use: Yes    Alcohol/week: 2.0 standard drinks    Types: 2 Shots of liquor per week  . Drug use: No    Review of Systems Limited by distress Constitutional: No fevers or chills  ENT: No throat swelling Cardiovascular: Denies chest pain. Respiratory: No cough Gastrointestinal:no vomiting.    Musculoskeletal: No leg swelling    ____________________________________________   PHYSICAL EXAM:  VITAL SIGNS: ED Triage Vitals  Enc Vitals Group     BP 07/16/19 1220 (!) 127/99     Pulse Rate 07/16/19 1216 (!) 117     Resp 07/16/19 1216 (!) 30     Temp 07/16/19 1220 97.7 F (36.5 C)     Temp Source 07/16/19 1220 Axillary     SpO2 07/16/19 1216 100 %     Weight 07/16/19 1212 76.6 kg (168 lb 14 oz)     Height 07/16/19 1212 1.626 m (5\' 4" )     Head Circumference --      Peak Flow --      Pain Score 07/16/19 1211 0     Pain Loc --      Pain Edu? --      Excl. in [place]? --     Constitutional: BiPAP in place, tripod position, significant work of breathing  Head:  Atraumatic.   Neck:  Painless ROM Cardiovascular: Tachycardia, regular rhythm. Grossly normal heart sounds.  Good peripheral circulation. Respiratory: Increased respiratory effort with significant tachypnea, retractions, diffuse wheezing Gastrointestinal: Soft and nontender. No distention.    Musculoskeletal: No lower extremity tenderness nor edema.  Warm and well perfused Neurologic:  Normal speech and language. No gross focal neurologic deficits are appreciated.  Skin:  Skin is warm, dry and intact. No rash noted. Psychiatric: Appropriately anxious  ____________________________________________   LABS (all labs ordered are listed, but only abnormal results are displayed)  Labs Reviewed  BASIC METABOLIC PANEL - Abnormal; Notable for the following components:      Result Value   CO2 18 (*)    Glucose, Bld 341 (*)    Creatinine, Ser 1.14 (*)    GFR calc non Af Amer 50 (*)    GFR calc Af Amer 58 (*)    All other components within normal limits  CBC - Abnormal; Notable for the following components:   WBC 14.4 (*)    RBC 3.55 (*)    Hemoglobin 11.5 (*)    MCV 101.4 (*)    Platelets 522 (*)    All other components within normal limits  BLOOD GAS, VENOUS - Abnormal; Notable for the following components:   pCO2, Ven 39 (*)    pO2, Ven 90.0 (*)    Bicarbonate 19.2 (*)    Acid-base deficit 6.7 (*)    All other components within normal limits  SARS CORONAVIRUS 2 (HOSPITAL ORDER, [place] [facility])  TROPONIN I (HIGH SENSITIVITY)  TROPONIN I (HIGH SENSITIVITY)   ____________________________________________  EKG  ED ECG REPORT I, Lavonia Drafts, the attending physician, personally viewed and interpreted this ECG.  Date: 07/16/2019  Rhythm: Tachycardia, sinus QRS Axis: normal Intervals: Abnormal ST/T Wave abnormalities: normal   ____________________________________________  RADIOLOGY  Chest x-ray ____________________________________________    PROCEDURES  Procedure(s) performed: No  Procedures   Critical Care performed: yes  CRITICAL CARE Performed by: Lavonia Drafts   Total critical care time:30 minutes  Critical care time was exclusive of separately billable procedures and treating other patients.  Critical care was necessary to treat or prevent imminent or life-threatening deterioration.  Critical care was time spent personally by me on the following activities: development of treatment plan with patient and/or surrogate as well as nursing, discussions with consultants, evaluation of patient's response to treatment, examination of patient, obtaining history from patient or surrogate, ordering and performing treatments and interventions, ordering and review of laboratory studies, ordering and review of radiographic studies, pulse oximetry and re-evaluation of patient's condition.  ____________________________________________   INITIAL IMPRESSION / ASSESSMENT AND PLAN / ED COURSE  Pertinent labs & imaging results that were available during my care of the patient were reviewed by me and considered in my medical decision making (see chart for details).  Patient presents with respiratory distress.  Given diffuse wheezing suspect COPD, BiPAP placed by RT, DuoNeb started, received IV Solu-Medrol, will also give IV magnesium, check gas and carefully monitor  Chest x-ray demonstrates bilateral pulmonary edema, will give IV Lasix, apply Nitropaste  Reevaluated the patient, she is improving, she reports her breathing is better, still on BiPAP  Discussed with Dr. Darvin Neighbours hospitalist service    ____________________________________________   FINAL CLINICAL IMPRESSION(S) / ED DIAGNOSES  Final diagnoses:  Acute respiratory failure with hypoxia ([place])  Acute pulmonary edema ([place])        Note:  This document was prepared using Dragon voice recognition software and may  include unintentional dictation errors.   Lavonia Drafts, MD 07/16/19 1420

## 2019-07-16 NOTE — Progress Notes (Signed)
Pt was removed from the bipap machine and placed on a 3 L nasal cannula. Sa02 90% at this time. Pt is resting comfortably and states her breathing is ok.

## 2019-07-16 NOTE — ED Notes (Signed)
Pt continues to use accessory muscles while breathing

## 2019-07-16 NOTE — ED Notes (Signed)
Pt given meal tray, juice and a cup of ice.

## 2019-07-16 NOTE — ED Notes (Signed)
Report given to Tasha, RN.

## 2019-07-16 NOTE — ED Notes (Signed)
Per admitting Dr, pt to be trialed off bipap

## 2019-07-16 NOTE — ED Notes (Signed)
Pt still having difficulty breathing and stating "I am so tired" and continues to be clammy- RT asked EDP for something to help with anxiety in an attempt to get her respiratory rate down

## 2019-07-16 NOTE — ED Notes (Signed)
Pt taken off bipap by RT and placed on 3L - pt verbalized tolerating well- O2 sats 93%

## 2019-07-16 NOTE — ED Triage Notes (Addendum)
Pt arrives via EMS from home after having difficulty breathing- pt was 66% on RA when EMS arrived- EMS placed pt on cpap- O2 sats now in the high 90s- Pt labored, tachypnic, and diaphoretic- pt given 125 of solumedrol by EMS

## 2019-07-16 NOTE — H&P (Addendum)
[facility] [place] NAME: Ashley [name]    MR#:  [id number]  DATE OF BIRTH:  1951-04-12  DATE OF ADMISSION:  07/16/2019  PRIMARY CARE PHYSICIAN: [name], MD   REQUESTING/REFERRING PHYSICIAN: Dr. Corky Downs  CHIEF COMPLAINT:   Chief Complaint  Patient presents with  . Respiratory Distress    HISTORY OF PRESENT ILLNESS:  Ashley [name]  is a 68 y.o. female with a known history of COPD, systolic CHF, hypertension, CAD presents to the emergency room complaining of 2 days of worsening shortness of breath, orthopnea.  Patient has had no change in medications or diet recently.  She was admitted to the hospital with a similar problem in August 2020.  Today she was noted to be extremely tachypneic and hypoxic needing BiPAP.  At this time patient is awake alert and able to give me history.  Old records reviewed.  Chest x-ray shows pulmonary edema extensively.  No chest pain  PAST MEDICAL HISTORY:   Past Medical History:  Diagnosis Date  . AICD (automatic cardioverter/defibrillator) present    on right side  . Anemia   . CAD (coronary artery disease)    s/p CABG  . CHF (congestive heart failure) ([place])   . Chronic kidney disease    renal artery stenosis  . Colon cancer ([place])   . Depression   . GERD (gastroesophageal reflux disease)   . Hx of colonic polyps   . Hyperlipidemia   . Hypertension   . Mitral valve disorder    s/p mitral valve repair wth CABG  . Myocardial infarction ([place])   . Peripheral vascular disease ([place])   . Presence of permanent cardiac pacemaker    Pacemaker/ Defibrillator    PAST SURGICAL HISTORY:   Past Surgical History:  Procedure Laterality Date  . arm surgery     fracture, has plates and screws  . CABG with mitral valve repair    . CARDIAC CATHETERIZATION    . COLON SURGERY     colon cancer  . COLONOSCOPY WITH PROPOFOL N/A 10/09/2016   Procedure: COLONOSCOPY WITH PROPOFOL;  Surgeon: Jonathon Bellows, MD;   Location: [facility] ENDOSCOPY;  Service: Endoscopy;  Laterality: N/A;  . COLONOSCOPY WITH PROPOFOL N/A 07/24/2018   Procedure: COLONOSCOPY WITH PROPOFOL;  Surgeon: Virgel Manifold, MD;  Location: [facility] ENDOSCOPY;  Service: Endoscopy;  Laterality: N/A;  . CORONARY ARTERY BYPASS GRAFT    . ENTEROSCOPY N/A 11/21/2018   Procedure: ENTEROSCOPY-BALLOON;  Surgeon: Jonathon Bellows, MD;  Location: [facility] ENDOSCOPY;  Service: Gastroenterology;  Laterality: N/A;  . ESOPHAGOGASTRODUODENOSCOPY (EGD) WITH PROPOFOL N/A 07/24/2018   Procedure: ESOPHAGOGASTRODUODENOSCOPY (EGD) WITH PROPOFOL;  Surgeon: Virgel Manifold, MD;  Location: [facility] ENDOSCOPY;  Service: Endoscopy;  Laterality: N/A;  . GIVENS CAPSULE STUDY N/A 08/16/2018   Procedure: GIVENS CAPSULE STUDY;  Surgeon: Virgel Manifold, MD;  Location: [facility] ENDOSCOPY;  Service: Endoscopy;  Laterality: N/A;  . pace maker defib  2016  . PERIPHERAL VASCULAR CATHETERIZATION N/A 10/23/2016   Procedure: Renal Angiography;  Surgeon: Algernon Huxley, MD;  Location: [facility];  Service: Cardiovascular;  Laterality: N/A;  . TOTAL ABDOMINAL HYSTERECTOMY  1999   history of abnormal pap    SOCIAL HISTORY:   Social History   Tobacco Use  . Smoking status: Former Smoker    Packs/day: 0.25    Types: Cigarettes    Quit date: 06/19/2019    Years since quitting: 0.0  . Smokeless tobacco: Never Used  .  Tobacco comment: reports smoking 2-3 cigarettes/ day  Substance Use Topics  . Alcohol use: Yes    Alcohol/week: 2.0 standard drinks    Types: 2 Shots of liquor per week    FAMILY HISTORY:   Family History  Problem Relation Age of Onset  . Arthritis Mother   . Cancer Mother        uterus cancer  . Hyperlipidemia Mother   . Hypertension Mother   . Heart disease Mother   . Diabetes Mother   . Hyperlipidemia Father   . Hypertension Father   . Heart disease Father   . Diabetes Father   . Cancer Sister        ovary cancer  . Diabetes Maternal Grandmother    . Hypertension Maternal Grandmother   . Arthritis Maternal Grandmother   . Hypertension Maternal Grandfather   . Hypertension Paternal Grandmother   . Hypertension Paternal Grandfather   . Heart disease Paternal Grandfather   . Heart disease Brother   . Kidney disease Brother   . Breast cancer Neg Hx     DRUG ALLERGIES:   Allergies  Allergen Reactions  . Hydrocodone-Acetaminophen Nausea Only    REVIEW OF SYSTEMS:   Review of Systems  Constitutional: Negative for chills and fever.  HENT: Negative for sore throat.   Eyes: Negative for blurred vision, double vision and pain.  Respiratory: Positive for shortness of breath. Negative for cough, hemoptysis and wheezing.   Cardiovascular: Positive for orthopnea. Negative for chest pain, palpitations and leg swelling.  Gastrointestinal: Negative for abdominal pain, constipation, diarrhea, heartburn, nausea and vomiting.  Genitourinary: Negative for dysuria and hematuria.  Musculoskeletal: Negative for back pain and joint pain.  Skin: Negative for rash.  Neurological: Negative for sensory change, speech change, focal weakness and headaches.  Endo/Heme/Allergies: Does not bruise/bleed easily.  Psychiatric/Behavioral: Negative for depression. The patient is not nervous/anxious.     MEDICATIONS AT HOME:   Prior to Admission medications   Medication Sig Start Date End Date Taking? Authorizing Provider  acetaminophen (TYLENOL) 500 MG tablet Maximum 6 tablets a day. Patient taking differently: Take 500 mg by mouth every 4 (four) hours as needed for fever. Maximum 6 tablets a day. 02/09/15  Yes Doss, Velora Heckler, RN  albuterol (VENTOLIN HFA) 108 (90 Base) MCG/ACT inhaler INHALE 2 PUFFS BY MOUTH INTO THE LUNGS EVERY 6 HOURS AS NEEDED FOR WHEEZING 06/03/19  Yes [name], MD  ALPRAZolam Duanne Moron) 0.5 MG tablet Take 0.5-1 tablets (0.25-0.5 mg total) by mouth at bedtime. TAKE ONE TABLET BY MOUTH AT BEDTIME AS NEEDED FOR ANXIETY 02/24/19  Yes  [name], MD  aspirin 81 MG tablet Take 81 mg by mouth daily.   Yes [provider]  atorvastatin (LIPITOR) 40 MG tablet Take 1 tablet (40 mg total) by mouth daily. 06/03/19  Yes [name], MD  carvedilol (COREG) 6.25 MG tablet TAKE ONE TABLET BY MOUTH TWICE DAILY WITH MEAL 06/03/19  Yes [name], MD  clopidogrel (PLAVIX) 75 MG tablet Take 1 tablet (75 mg total) by mouth daily. 06/03/19  Yes [name], MD  cyanocobalamin (,VITAMIN B-12,) 1000 MCG/ML injection Inject 1 mL weekly into the muscle monthly 11/08/18  Yes [name], MD  diphenhydrAMINE (DIPHENHIST) 25 mg capsule Take 25 mg by mouth at bedtime as needed.    Yes [provider]  escitalopram (LEXAPRO) 10 MG tablet Take 1 tablet (10 mg total) by mouth daily. 06/03/19  Yes [name],  MD  ferrous sulfate 325 (65 FE) MG EC tablet Take 325 mg by mouth daily with breakfast.   Yes [provider]  furosemide (LASIX) 40 MG tablet One tablet twice a dya for five days then can cut back to once a day Patient taking differently: Take 40 mg by mouth daily.  06/21/19  Yes Wieting, Richard, MD  ipratropium-albuterol (DUONEB) 0.5-2.5 (3) MG/3ML SOLN Take 3 mLs by nebulization every 6 (six) hours as needed. 08/05/18  Yes [name], MD  nitroGLYCERIN (NITROSTAT) 0.4 MG SL tablet Place 1 tablet (0.4 mg total) under the tongue every 5 (five) minutes as needed for chest pain. 06/19/16  Yes [name], MD  omeprazole (PRILOSEC) 40 MG capsule Take 1 capsule (40 mg total) by mouth daily. 06/03/19  Yes [name], MD  potassium chloride (K-DUR) 10 MEQ tablet Take 1 tablet (10 mEq total) by mouth daily. 06/03/19  Yes [name], MD  sacubitril-valsartan (ENTRESTO) 24-26 MG Take 1 tablet by mouth 2 (two) times daily. 06/03/19  Yes [name], MD  spironolactone (ALDACTONE) 25 MG tablet Take 0.5 tablets (12.5 mg total) by mouth daily. 06/21/19 06/20/20 Yes Wieting, Richard, MD  Tiotropium  Bromide Monohydrate (SPIRIVA RESPIMAT) 2.5 MCG/ACT AERS Inhale 5 mcg into the lungs every morning. 07/03/19  Yes [name], MD  traZODone (DESYREL) 50 MG tablet Take 0.5-1 tablets (25-50 mg total) by mouth daily. One hour before bedtime 06/03/19  Yes [name], MD     VITAL SIGNS:  Blood pressure (!) 146/103, pulse 95, temperature 97.7 F (36.5 C), temperature source Axillary, resp. rate (!) 31, height 5\' 4"  (1.626 m), weight 76.6 kg, SpO2 96 %.  PHYSICAL EXAMINATION:  Physical Exam  GENERAL:  68 y.o.-year-old patient lying in the bed, looks critically ill EYES: Pupils equal, round, reactive to light and accommodation. No scleral icterus. Extraocular muscles intact.  HEENT: Head atraumatic, normocephalic. Oropharynx and nasopharynx clear. No oropharyngeal erythema, moist oral mucosa  NECK:  Supple, no jugular venous distention. No thyroid enlargement, no tenderness.  LUNGS: Bilateral crackles and decreased air entry.  Using accessory muscles. CARDIOVASCULAR: S1, S2 normal. No murmurs, rubs, or gallops.  ABDOMEN: Soft, nontender, nondistended. Bowel sounds present. No organomegaly or mass.  EXTREMITIES: No pedal edema, cyanosis, or clubbing. + 2 pedal & radial pulses b/l.   NEUROLOGIC: Cranial nerves II through XII are intact. No focal Motor or sensory deficits appreciated b/l PSYCHIATRIC: The patient is alert and oriented x 3.  Anxious SKIN: No obvious rash, lesion, or ulcer.   LABORATORY PANEL:   CBC Recent Labs  Lab 07/16/19 1215  WBC 14.4*  HGB 11.5*  HCT 36.0  PLT 522*   ------------------------------------------------------------------------------------------------------------------  Chemistries  Recent Labs  Lab 07/16/19 1215  NA 138  K 5.1  CL 105  CO2 18*  GLUCOSE 341*  BUN 15  CREATININE 1.14*  CALCIUM 9.3   ------------------------------------------------------------------------------------------------------------------  Cardiac Enzymes No  results for input(s): TROPONINI in the last 168 hours. ------------------------------------------------------------------------------------------------------------------  RADIOLOGY:  Dg Chest Port 1 View  Result Date: 07/16/2019 CLINICAL DATA:  Shortness of breath. Hypoxia. EXAM: PORTABLE CHEST 1 VIEW COMPARISON:  06/21/2019 FINDINGS: Patient is new extensive bilateral pulmonary edema. Chronic cardiomegaly. AICD in place. CABG. No acute bone abnormality. No discrete effusions. Aortic atherosclerosis. IMPRESSION: New extensive bilateral pulmonary edema. Aortic Atherosclerosis (ICD10-I70.0). Electronically Signed   By: Lorriane Shire M.D.   On: 07/16/2019 12:52     IMPRESSION AND PLAN:   *  Acute hypoxic respiratory failure secondary to acute on chronic systolic congestive heart failure  Recent echocardiogram reviewed and ejection fraction 45%. We will start Lasix 60 mg twice daily.  Monitor input and output.  Replace potassium as needed. Wean off BiPAP to nasal cannula. On beta-blocker Coreg.  Will not add ACE or arm at this time due to mild elevation of potassium  *COPD.  Continue home inhalers, nebulizers as needed  *Hypertension.  Continue home medications  *CAD.  Stable.  No chest pain.  DVT prophylaxis with Lovenox  All the records are reviewed and case discussed with ED provider. Management plans discussed with the patient, family and they are in agreement.  CODE STATUS: FULL CODE  TOTAL CRITICAL CARE TIME TAKING CARE OF THIS PATIENT: 80 minutes.   Leia Alf Adham Johnson M.D on 07/16/2019 at 3:11 PM  Between 7am to 6pm - Pager - [phone]  After 6pm go to [email or web address] - password [email or web address] [facility]  Office  [phone]  CC: Primary care physician; [name], MD  Note: This dictation was prepared with Dragon dictation along with smaller phrase technology. Any transcriptional errors that result from this process are unintentional.

## 2019-07-16 NOTE — ED Notes (Signed)
Spoke with Aniceto Boss and asked her to send for transport for pt

## 2019-07-16 NOTE — ED Notes (Addendum)
Pt requesting remote- pt was updated on plan of care

## 2019-07-16 NOTE — ED Notes (Signed)
Attempted to call report- was told they could not get in touch with the nurse at this moment and took this RN's number for a return call

## 2019-07-17 ENCOUNTER — Ambulatory Visit: Payer: Self-pay | Admitting: Pharmacist

## 2019-07-17 DIAGNOSIS — J9612 Chronic respiratory failure with hypercapnia: Secondary | ICD-10-CM

## 2019-07-17 DIAGNOSIS — J9611 Chronic respiratory failure with hypoxia: Secondary | ICD-10-CM

## 2019-07-17 LAB — BASIC METABOLIC PANEL
Anion gap: 12 (ref 5–15)
BUN: 28 mg/dL — ABNORMAL HIGH (ref 8–23)
CO2: 24 mmol/L (ref 22–32)
Calcium: 8.8 mg/dL — ABNORMAL LOW (ref 8.9–10.3)
Chloride: 102 mmol/L (ref 98–111)
Creatinine, Ser: 1.17 mg/dL — ABNORMAL HIGH (ref 0.44–1.00)
GFR calc Af Amer: 56 mL/min — ABNORMAL LOW (ref >60)
GFR calc non Af Amer: 49 mL/min — ABNORMAL LOW (ref >60)
Glucose, Bld: 189 mg/dL — ABNORMAL HIGH (ref 70–99)
Potassium: 3.8 mmol/L (ref 3.5–5.1)
Sodium: 138 mmol/L (ref 135–145)

## 2019-07-17 LAB — CBC
HCT: 30.2 % — ABNORMAL LOW (ref 36.0–46.0)
Hemoglobin: 10.1 g/dL — ABNORMAL LOW (ref 12.0–15.0)
MCH: 32.6 pg (ref 26.0–34.0)
MCHC: 33.4 g/dL (ref 30.0–36.0)
MCV: 97.4 fL (ref 80.0–100.0)
Platelets: 312 10*3/uL (ref 150–400)
RBC: 3.1 MIL/uL — ABNORMAL LOW (ref 3.87–5.11)
RDW: 14.2 % (ref 11.5–15.5)
WBC: 12.6 10*3/uL — ABNORMAL HIGH (ref 4.0–10.5)
nRBC: 0 % (ref 0.0–0.2)

## 2019-07-17 MED ORDER — POTASSIUM CHLORIDE CRYS ER 20 MEQ PO TBCR
40.0000 meq | EXTENDED_RELEASE_TABLET | Freq: Once | ORAL | Status: AC
  Administered 2019-07-17: 40 meq via ORAL
  Filled 2019-07-17: qty 2

## 2019-07-17 NOTE — Progress Notes (Signed)
Advance care planning  Purpose of Encounter CHF, COPD  Parties in Attendance Patient  Patients Decisional capacity Alert and oriented.  Able to make medical decisions.  No documented healthcare power of attorney or ACP documents in place  She wants her fianc Ms. Roda Shutters to make decisions if she is unable to.  Discussed in detail regarding CHF, COPD.  Treatment plan , prognosis discussed.  All questions answered.  CODE STATUS discussed and patient wishes for aggressive medical care with CPR/intubation/defibrillation if necessary.  Does not want to be on prolonged life support.  Orders entered and CODE STATUS changed  Full code  Time spent - 17 minutes

## 2019-07-17 NOTE — TOC Initial Note (Signed)
Transition of Care [facility]) - Initial/Assessment Note    Patient Details  Name: Ashley [name] MRN: [id number] Date of Birth: Jun 30, 1951  [facility]) CM/SW Contact:    Elza Rafter, RN Phone Number: 07/17/2019, 2:56 PM  Clinical Narrative:       Patient admitted with SOB; history of COPD and CHF.  From home with fiance  Currently weaning to 1L from 3L acute oxygen.  Receiving Lasix 60mg  IV BID.  Current with Dr. Derrel Nip.  Obtains [facility] without difficulty at [facility].  Current with [facility] and has a functioning scale at home; weighs daily.  Uses a walker and a cane.  No housing or transportation needs.  Requesting a bedside commode-notified Brad with [employer].  Will continue to follow and assist with progressing patient.  Geoffry Paradise, BSN, RN  [phone]       Expected Discharge Plan: Home/Self Care Barriers to Discharge: Continued Medical Work up   Patient Goals and CMS Choice Patient states their goals for this hospitalization and ongoing recovery are:: discharge home      Expected Discharge Plan and Services Expected Discharge Plan: Home/Self Care In-house Referral: [facility] Discharge Planning Services: CM Consult                     DME Arranged: Bedside commode DME Agency: [employer] Date DME Agency Contacted: 07/17/19 Time DME Agency Contacted: 66 Representative spoke with at DME Agency: [place]: Refused [place]          Prior Living Arrangements/Services   Lives with:: Significant Other Patient language and need for interpreter reviewed:: Yes Do you feel safe going back to the place where you live?: Yes            Criminal Activity/Legal Involvement Pertinent to Current Situation/Hospitalization: No - Comment as needed  Activities of Daily Living Home Assistive Devices/Equipment: Cane (specify quad or straight), Walker (specify type) ADL Screening (condition at time of admission) Patient's cognitive ability  adequate to safely complete daily activities?: Yes Is the patient deaf or have difficulty hearing?: No Does the patient have difficulty seeing, even when wearing glasses/contacts?: No Does the patient have difficulty concentrating, remembering, or making decisions?: No Patient able to express need for assistance with ADLs?: Yes Does the patient have difficulty dressing or bathing?: No Independently performs ADLs?: Yes (appropriate for developmental age) Does the patient have difficulty walking or climbing stairs?: Yes Weakness of Legs: None Weakness of Arms/Hands: None  Permission Sought/Granted   Permission granted to share information with : Yes, Verbal Permission Granted  Share Information with NAME: Leroy Sea  Permission granted to share info w AGENCY: [employer]        Emotional Assessment Appearance:: Appears younger than stated age Attitude/Demeanor/Rapport: Gracious Affect (typically observed): Accepting Orientation: : Oriented to Self, Oriented to Place, Oriented to  Time, Oriented to Situation Alcohol / Substance Use: Not Applicable Psych Involvement: No (comment)  Admission diagnosis:  Acute pulmonary edema (HCC) [J81.0] Acute respiratory failure with hypoxia (HCC) [J96.01] CHF (congestive heart failure) (HCC) [I50.9] Patient Active Problem List   Diagnosis Date Noted  . CHF (congestive heart failure) ([place]) 07/16/2019  . Acute respiratory failure with hypoxia ([place]) 06/19/2019  . Cough with hemoptysis 06/09/2019  . Acute on chronic combined systolic and diastolic CHF (congestive heart failure) ([place]) 05/27/2019  . Insomnia 05/07/2019  . Hypokalemia 03/23/2019  . Hyperglycemia, drug-induced 03/23/2019  . Covid-19 Virus not Detected 03/23/2019  . Respiratory failure ([place]) 03/16/2019  .  AVM (arteriovenous malformation) of small bowel, acquired   . Anemia, iron deficiency 11/10/2018  . Chronic systolic heart failure ([place]) 11/06/2018  . Rectal polyp   . Benign neoplasm of cecum    . Barrett's esophagus without dysplasia   . Stomach irritation   . Chronic diastolic heart failure ([place]) 07/03/2018  . HTN (hypertension) 07/03/2018  . 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 B12 deficiency anemia 06/28/2018  . Hypotension 05/11/2018  . Prediabetes 05/11/2018  . Personal history of colon cancer   . Benign neoplasm of descending colon   . Polyp of sigmoid colon   . Benign neoplasm of transverse colon   . Diverticulosis of large intestine without diverticulitis   . 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 Renal artery stenosis ([place]) 10/03/2016  . 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 Hospital discharge follow-up 02/09/2015  . Cough in adult 10/21/2014  . Tobacco abuse 10/11/2014  . Tobacco abuse counseling 10/11/2014  . Chronic right hip pain 08/26/2014  . Atherosclerosis of native artery of extremity with intermittent claudication ([place]) 06/18/2013  . Preoperative evaluation to rule out surgical contraindication 06/18/2013  . CAD (coronary artery disease) 06/01/2013  . GERD (gastroesophageal reflux disease) 06/01/2013  . Hypercholesterolemia 06/01/2013  . Tubular adenoma of colon 06/01/2013  . 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 [place] [address] Phone: [phone] Fax: [phone]  [facility] [place], [address] [place] [address] [place] [address] Phone: [phone] Fax: [phone]  [facility] [place], [address] [place] [address] Phone: [phone] Fax: [phone]     Social Determinants of Health (SDOH) Interventions    Readmission Risk Interventions Readmission Risk Prevention Plan 07/17/2019 05/30/2019  Transportation Screening Complete Complete  PCP or Specialist Appt within 3-5 Days - Complete  HRI or [place] - Complete  Palliative Care Screening - Not Applicable  Medication Review ([place]) Complete Complete  PCP or Specialist appointment within 3-5 days of discharge Complete -  [place] or Home Care Consult Complete -  SW Recovery Care/Counseling Consult Complete -  Palliative Care Screening Not Applicable -  [place] Not Applicable -  Some recent data might be hidden

## 2019-07-17 NOTE — Chronic Care Management (AMB) (Signed)
Chronic Care Management   Follow Up Note   07/17/2019 Name: Ashley [name] MRN: [id number] DOB: 06-23-1951  Referred by: [name], MD Reason for referral : Chronic Care Management (Medication Management)   Ashley [name] is a 68 y.o. year old female who is a primary care patient of [name], [name], MD. The CCM team was consulted for assistance with chronic disease management and care coordination needs.    Care coordination completed today.   Review of patient status, including review of consultants reports, relevant laboratory and other test results, and collaboration with appropriate care team members and the patient's provider was performed as part of comprehensive patient evaluation and provision of chronic care management services.    SDOH (Social Determinants of Health) screening performed today: Financial Strain . See Care Plan for related entries.   Facility-Administered Encounter Medications as of 07/17/2019  Medication  . acetaminophen (TYLENOL) tablet 650 mg   Or  . acetaminophen (TYLENOL) suppository 650 mg  . albuterol (PROVENTIL) (2.5 MG/3ML) 0.083% nebulizer solution 2.5 mg  . ALPRAZolam (XANAX) tablet 0.25-0.5 mg  . aspirin EC tablet 81 mg  . atorvastatin (LIPITOR) tablet 40 mg  . carvedilol (COREG) tablet 3.125 mg  . clopidogrel (PLAVIX) tablet 75 mg  . enoxaparin (LOVENOX) injection 40 mg  . escitalopram (LEXAPRO) tablet 10 mg  . ferrous sulfate tablet 325 mg  . furosemide (LASIX) injection 60 mg  . nitroGLYCERIN (NITROGLYN) 2 % ointment 0.5 inch  . nitroGLYCERIN (NITROSTAT) SL tablet 0.4 mg  . ondansetron (ZOFRAN) tablet 4 mg   Or  . ondansetron (ZOFRAN) injection 4 mg  . pantoprazole (PROTONIX) EC tablet 40 mg  . polyethylene glycol (MIRALAX / GLYCOLAX) packet 17 g  . sacubitril-valsartan (ENTRESTO) 24-26 mg per tablet  . sodium chloride flush (NS) 0.9 % injection 3 mL  . tiotropium (SPIRIVA) inhalation capsule ([facility] use ONLY) 18 mcg  .  traZODone (DESYREL) tablet 50 mg   Outpatient Encounter Medications as of 07/17/2019  Medication Sig Note  . acetaminophen (TYLENOL) 500 MG tablet Maximum 6 tablets a day. (Patient taking differently: Take 500 mg by mouth every 4 (four) hours as needed for fever. Maximum 6 tablets a day.)   . albuterol (VENTOLIN HFA) 108 (90 Base) MCG/ACT inhaler INHALE 2 PUFFS BY MOUTH INTO THE LUNGS EVERY 6 HOURS AS NEEDED FOR WHEEZING 07/03/2019: Using 1-2 times daily currently  . ALPRAZolam (XANAX) 0.5 MG tablet Take 0.5-1 tablets (0.25-0.5 mg total) by mouth at bedtime. TAKE ONE TABLET BY MOUTH AT BEDTIME AS NEEDED FOR ANXIETY   . aspirin 81 MG tablet Take 81 mg by mouth daily.   [name] atorvastatin (LIPITOR) 40 MG tablet Take 1 tablet (40 mg total) by mouth daily.   . carvedilol (COREG) 6.25 MG tablet TAKE ONE TABLET BY MOUTH TWICE DAILY WITH MEAL   . clopidogrel (PLAVIX) 75 MG tablet Take 1 tablet (75 mg total) by mouth daily.   . cyanocobalamin (,VITAMIN B-12,) 1000 MCG/ML injection Inject 1 mL weekly into the muscle monthly   . diphenhydrAMINE (DIPHENHIST) 25 mg capsule Take 25 mg by mouth at bedtime as needed.    [name] escitalopram (LEXAPRO) 10 MG tablet Take 1 tablet (10 mg total) by mouth daily.   . ferrous sulfate 325 (65 FE) MG EC tablet Take 325 mg by mouth daily with breakfast.   . furosemide (LASIX) 40 MG tablet One tablet twice a dya for five days then can cut back to once a day (Patient taking  differently: Take 40 mg by mouth daily. )   . ipratropium-albuterol (DUONEB) 0.5-2.5 (3) MG/3ML SOLN Take 3 mLs by nebulization every 6 (six) hours as needed.   . nitroGLYCERIN (NITROSTAT) 0.4 MG SL tablet Place 1 tablet (0.4 mg total) under the tongue every 5 (five) minutes as needed for chest pain.   [name] omeprazole (PRILOSEC) 40 MG capsule Take 1 capsule (40 mg total) by mouth daily.   . potassium chloride (K-DUR) 10 MEQ tablet Take 1 tablet (10 mEq total) by mouth daily.   . sacubitril-valsartan (ENTRESTO) 24-26 MG  Take 1 tablet by mouth 2 (two) times daily.   [name] spironolactone (ALDACTONE) 25 MG tablet Take 0.5 tablets (12.5 mg total) by mouth daily.   . Tiotropium Bromide Monohydrate (SPIRIVA RESPIMAT) 2.5 MCG/ACT AERS Inhale 5 mcg into the lungs every morning.   . traZODone (DESYREL) 50 MG tablet Take 0.5-1 tablets (25-50 mg total) by mouth daily. One hour before bedtime      Goals Addressed            This Visit's Progress     Patient Stated   . "I can't afford my medications and I'm out" (pt-stated)       Current Barriers:  . Financial concerns - patient notes that she was unable to afford her Medicare Part D premium, so lost coverage. She has applied for LIS and was told she was approved; unsure if she has reinstated her insurance coverage yet o In the meantime, applied for Spiriva patient assistance through [employer], as previously Medication Management was out of any LAMA agents  o Patient currently admitted for hypoxia  Pharmacist Clinical Goal(s):  [name] Over the next 30 days, patient will work with PharmD to address needs related to medication access  Interventions: . [place] - patient was APPROVED for Spiriva assistance through 11/06/2019. 3 month supply of this medication is scheduled to be delivered to her home today.  . Contacted her home number; left a HIPAA compliant message to ask her roommate to look out for the delivery today.  Patient Self Care Activities:  . Self administers medications as prescribed . Calls pharmacy for medication refills  Please see past updates related to this goal by clicking on the "Past Updates" button in the selected goal          Plan:  - Will await notification of discharge and outreach patient for medication management support.   [name] Darnelle Maffucci, PharmD, [place] Pharmacist [place] [facility] [place] [phone]

## 2019-07-17 NOTE — Patient Instructions (Signed)
Visit Information  Goals Addressed            This Visit's Progress     Patient Stated   . "I can't afford my medications and I'm out" (pt-stated)       Current Barriers:  . Financial concerns - patient notes that she was unable to afford her Medicare Part D premium, so lost coverage. She has applied for LIS and was told she was approved; unsure if she has reinstated her insurance coverage yet o In the meantime, applied for Spiriva patient assistance through [employer], as previously Medication Management was out of any LAMA agents  o Patient currently admitted for hypoxia  Pharmacist Clinical Goal(s):  [name] Over the next 30 days, patient will work with PharmD to address needs related to medication access  Interventions: . [place] - patient was APPROVED for Spiriva assistance through 11/06/2019. 3 month supply of this medication is scheduled to be delivered to her home today.  . Contacted her home number; left a HIPAA compliant message to ask her roommate to look out for the delivery today.  Patient Self Care Activities:  . Self administers medications as prescribed . Calls pharmacy for medication refills  Please see past updates related to this goal by clicking on the "Past Updates" button in the selected goal         The patient verbalized understanding of instructions provided today and declined a print copy of patient instruction materials.   Plan:  - Will await notification of discharge and outreach patient for medication management support.   [name] Darnelle Maffucci, PharmD, [place] Pharmacist [facility] [phone]

## 2019-07-17 NOTE — Progress Notes (Signed)
Dr. Darvin Neighbours made aware of BP 96/61.  Orders received to dc nitro paste, but give Coreg and Lasix

## 2019-07-17 NOTE — Progress Notes (Signed)
[place] at [place] NAME: Ashley [name]    MR#:  [id number]  DATE OF BIRTH:  [birth year]  SUBJECTIVE:  CHIEF COMPLAINT:   Chief Complaint  Patient presents with  . Respiratory Distress   Shortness of breath is improving but still present.  Occasional blood-tinged sputum over the last few weeks. Afebrile. On 3 L oxygen.  REVIEW OF SYSTEMS:    Review of Systems  Constitutional: Positive for malaise/fatigue. Negative for chills and fever.  HENT: Negative for sore throat.   Eyes: Negative for blurred vision, double vision and pain.  Respiratory: Positive for cough, hemoptysis, sputum production and shortness of breath. Negative for wheezing.   Cardiovascular: Positive for orthopnea. Negative for chest pain, palpitations and leg swelling.  Gastrointestinal: Negative for abdominal pain, constipation, diarrhea, heartburn, nausea and vomiting.  Genitourinary: Negative for dysuria and hematuria.  Musculoskeletal: Negative for back pain and joint pain.  Skin: Negative for rash.  Neurological: Negative for sensory change, speech change, focal weakness and headaches.  Endo/Heme/Allergies: Does not bruise/bleed easily.  Psychiatric/Behavioral: Negative for depression. The patient is not nervous/anxious.     DRUG ALLERGIES:   Allergies  Allergen Reactions  . Hydrocodone-Acetaminophen Nausea Only    VITALS:  Blood pressure 119/69, pulse 75, temperature 97.6 F (36.4 C), temperature source Oral, resp. rate 19, height 5\' 4"  (1.626 m), weight 72.7 kg, SpO2 95 %.  PHYSICAL EXAMINATION:   Physical Exam  GENERAL:  68 y.o.-year-old patient lying in the bed with no acute distress.  EYES: Pupils equal, round, reactive to light and accommodation. No scleral icterus. Extraocular muscles intact.  HEENT: Head atraumatic, normocephalic. Oropharynx and nasopharynx clear.  NECK:  Supple, no jugular venous distention. No thyroid enlargement, no  tenderness.  LUNGS: Bilateral crackles CARDIOVASCULAR: S1, S2 normal. No murmurs, rubs, or gallops.  ABDOMEN: Soft, nontender, nondistended. Bowel sounds present. No organomegaly or mass.  EXTREMITIES: No cyanosis, clubbing or edema b/l.    NEUROLOGIC: Cranial nerves II through XII are intact. No focal Motor or sensory deficits b/l.   PSYCHIATRIC: The patient is alert and oriented x 3.  SKIN: No obvious rash, lesion, or ulcer.   LABORATORY PANEL:   CBC Recent Labs  Lab 07/17/19 0454  WBC 12.6*  HGB 10.1*  HCT 30.2*  PLT 312   ------------------------------------------------------------------------------------------------------------------ Chemistries  Recent Labs  Lab 07/17/19 0454  NA 138  K 3.8  CL 102  CO2 24  GLUCOSE 189*  BUN 28*  CREATININE 1.17*  CALCIUM 8.8*   ------------------------------------------------------------------------------------------------------------------  Cardiac Enzymes No results for input(s): TROPONINI in the last 168 hours. ------------------------------------------------------------------------------------------------------------------  RADIOLOGY:  Dg Chest Port 1 View  Result Date: 07/16/2019 CLINICAL DATA:  Shortness of breath. Hypoxia. EXAM: PORTABLE CHEST 1 VIEW COMPARISON:  06/21/2019 FINDINGS: Patient is new extensive bilateral pulmonary edema. Chronic cardiomegaly. AICD in place. CABG. No acute bone abnormality. No discrete effusions. Aortic atherosclerosis. IMPRESSION: New extensive bilateral pulmonary edema. Aortic Atherosclerosis (ICD10-I70.0). Electronically Signed   By: Lorriane Shire M.D.   On: 07/16/2019 12:52     ASSESSMENT AND PLAN:   *Acute hypoxic respiratory failure secondary to acute on chronic systolic congestive heart failure  Recent echocardiogram reviewed and ejection fraction 45%. Will need further diuresis.  IV Lasix 60 mg twice daily. Wean off oxygen slowly to room air as tolerated On beta-blocker Coreg.    Add ACE inhibitor if blood pressure tolerates  *Intermittent hemoptysis.  Blood streaks with sputum.  I suspect this is due  to her emphysema.  Reviewed CT scan from August which showed no masses or tumors.  She does have outpatient follow-up with pulmonary.  Discussed this with patient in detail.  *COPD.  Continue home inhalers, nebulizers as needed  *Hypertension.  Continue home medications  *CAD.  Stable.  No chest pain.  DVT prophylaxis with Lovenox  All the records are reviewed and case discussed with Care Management/Social Worker Management plans discussed with the patient, family and they are in agreement.  CODE STATUS: FULL CODE  TOTAL TIME TAKING CARE OF THIS PATIENT: 30 minutes.   POSSIBLE D/C IN 2-3 DAYS, DEPENDING ON CLINICAL CONDITION.  Leia Alf Uchenna Rappaport M.D on 07/17/2019 at 10:35 AM  Between 7am to 6pm - Pager - [phone]  After 6pm go to [email or web address] - password [email or web address] [place] [facility]  Office  [phone]  CC: Primary care physician; [name], MD  Note: This dictation was prepared with Dragon dictation along with smaller phrase technology. Any transcriptional errors that result from this process are unintentional.

## 2019-07-17 NOTE — Plan of Care (Signed)
  Problem: Education: Goal: Knowledge of General Education information will improve Description: Including pain rating scale, medication(s)/side effects and non-pharmacologic comfort measures Outcome: Progressing   Problem: Health Behavior/Discharge Planning: Goal: Ability to manage health-related needs will improve Outcome: Progressing   Problem: Clinical Measurements: Goal: Ability to maintain clinical measurements within normal limits will improve Outcome: Progressing Goal: Will remain free from infection Outcome: Progressing Note: Remains afebrile    Problem: Nutrition: Goal: Adequate nutrition will be maintained Outcome: Progressing   Problem: Coping: Goal: Level of anxiety will decrease Outcome: Progressing   Problem: Elimination: Goal: Will not experience complications related to bowel motility Outcome: Progressing Goal: Will not experience complications related to urinary retention Outcome: Progressing   Problem: Pain Managment: Goal: General experience of comfort will improve Outcome: Progressing   Problem: Safety: Goal: Ability to remain free from injury will improve Outcome: Progressing   Problem: Skin Integrity: Goal: Risk for impaired skin integrity will decrease Outcome: Progressing   Problem: Clinical Measurements: Goal: Diagnostic test results will improve Outcome: Not Progressing Note: K 3.8, replaced with PO this morning, BUN 28/1.17

## 2019-07-18 LAB — BASIC METABOLIC PANEL
Anion gap: 11 (ref 5–15)
BUN: 39 mg/dL — ABNORMAL HIGH (ref 8–23)
CO2: 27 mmol/L (ref 22–32)
Calcium: 9 mg/dL (ref 8.9–10.3)
Chloride: 102 mmol/L (ref 98–111)
Creatinine, Ser: 1.27 mg/dL — ABNORMAL HIGH (ref 0.44–1.00)
GFR calc Af Amer: 51 mL/min — ABNORMAL LOW (ref >60)
GFR calc non Af Amer: 44 mL/min — ABNORMAL LOW (ref >60)
Glucose, Bld: 139 mg/dL — ABNORMAL HIGH (ref 70–99)
Potassium: 3.4 mmol/L — ABNORMAL LOW (ref 3.5–5.1)
Sodium: 140 mmol/L (ref 135–145)

## 2019-07-18 LAB — HEMOGLOBIN A1C
Hgb A1c MFr Bld: 5.7 % — ABNORMAL HIGH (ref 4.8–5.6)
Mean Plasma Glucose: 117 mg/dL

## 2019-07-18 LAB — MAGNESIUM: Magnesium: 2.1 mg/dL (ref 1.7–2.4)

## 2019-07-18 MED ORDER — POTASSIUM CHLORIDE CRYS ER 20 MEQ PO TBCR
20.0000 meq | EXTENDED_RELEASE_TABLET | Freq: Once | ORAL | Status: AC
  Administered 2019-07-18: 20 meq via ORAL
  Filled 2019-07-18: qty 1

## 2019-07-18 MED ORDER — POTASSIUM CHLORIDE CRYS ER 20 MEQ PO TBCR
40.0000 meq | EXTENDED_RELEASE_TABLET | Freq: Once | ORAL | Status: AC
  Administered 2019-07-18: 40 meq via ORAL
  Filled 2019-07-18: qty 2

## 2019-07-18 NOTE — Care Management Important Message (Signed)
Important Message  Patient Details  Name: Ashley [name] MRN: [id number] Date of Birth: 1951-04-02   Medicare Important Message Given:  Yes     Dannette Barbara 07/18/2019, 10:32 AM

## 2019-07-18 NOTE — Progress Notes (Signed)
Per CCMD, pt had 20 beat run of v-tach, pt asymptomatic, pt laying in bed sleeping. Dr. Darvin Neighbours made aware, no new orders received.Will continue to monitor.

## 2019-07-18 NOTE — Progress Notes (Signed)
[place] at [place] NAME: Ashley [name]    MR#:  [id number]  DATE OF BIRTH:  July 28, 1951  SUBJECTIVE:  CHIEF COMPLAINT:   Chief Complaint  Patient presents with  . Respiratory Distress   Off oxygen today.  Continues to have orthopnea and shortness of breath.  REVIEW OF SYSTEMS:    Review of Systems  Constitutional: Positive for malaise/fatigue. Negative for chills and fever.  HENT: Negative for sore throat.   Eyes: Negative for blurred vision, double vision and pain.  Respiratory: Positive for cough, hemoptysis, sputum production and shortness of breath. Negative for wheezing.   Cardiovascular: Positive for orthopnea. Negative for chest pain, palpitations and leg swelling.  Gastrointestinal: Negative for abdominal pain, constipation, diarrhea, heartburn, nausea and vomiting.  Genitourinary: Negative for dysuria and hematuria.  Musculoskeletal: Negative for back pain and joint pain.  Skin: Negative for rash.  Neurological: Negative for sensory change, speech change, focal weakness and headaches.  Endo/Heme/Allergies: Does not bruise/bleed easily.  Psychiatric/Behavioral: Negative for depression. The patient is not nervous/anxious.     DRUG ALLERGIES:   Allergies  Allergen Reactions  . Hydrocodone-Acetaminophen Nausea Only    VITALS:  Blood pressure (!) 100/54, pulse 68, temperature 97.8 F (36.6 C), temperature source Oral, resp. rate 19, height 5\' 4"  (1.626 m), weight 72.3 kg, SpO2 96 %.  PHYSICAL EXAMINATION:   Physical Exam  GENERAL:  68 y.o.-year-old patient lying in the bed with no acute distress.  EYES: Pupils equal, round, reactive to light and accommodation. No scleral icterus. Extraocular muscles intact.  HEENT: Head atraumatic, normocephalic. Oropharynx and nasopharynx clear.  NECK:  Supple, no jugular venous distention. No thyroid enlargement, no tenderness.  LUNGS: Bilateral crackles CARDIOVASCULAR: S1, S2  normal. No murmurs, rubs, or gallops.  ABDOMEN: Soft, nontender, nondistended. Bowel sounds present. No organomegaly or mass.  EXTREMITIES: No cyanosis, clubbing or edema b/l.    NEUROLOGIC: Cranial nerves II through XII are intact. No focal Motor or sensory deficits b/l.   PSYCHIATRIC: The patient is alert and oriented x 3.  SKIN: No obvious rash, lesion, or ulcer.   LABORATORY PANEL:   CBC Recent Labs  Lab 07/17/19 0454  WBC 12.6*  HGB 10.1*  HCT 30.2*  PLT 312   ------------------------------------------------------------------------------------------------------------------ Chemistries  Recent Labs  Lab 07/18/19 0558  NA 140  K 3.4*  CL 102  CO2 27  GLUCOSE 139*  BUN 39*  CREATININE 1.27*  CALCIUM 9.0  MG 2.1   ------------------------------------------------------------------------------------------------------------------  Cardiac Enzymes No results for input(s): TROPONINI in the last 168 hours. ------------------------------------------------------------------------------------------------------------------  RADIOLOGY:  Dg Chest Port 1 View  Result Date: 07/16/2019 CLINICAL DATA:  Shortness of breath. Hypoxia. EXAM: PORTABLE CHEST 1 VIEW COMPARISON:  06/21/2019 FINDINGS: Patient is new extensive bilateral pulmonary edema. Chronic cardiomegaly. AICD in place. CABG. No acute bone abnormality. No discrete effusions. Aortic atherosclerosis. IMPRESSION: New extensive bilateral pulmonary edema. Aortic Atherosclerosis (ICD10-I70.0). Electronically Signed   By: Lorriane Shire M.D.   On: 07/16/2019 12:52     ASSESSMENT AND PLAN:   *Acute hypoxic respiratory failure secondary to acute on chronic systolic congestive heart failure  Recent echocardiogram reviewed and ejection fraction 45%. Will need further diuresis.  Continue IV Lasix 60 mg twice daily. Weaned off oxygen On beta-blocker Coreg.   Add ACE inhibitor if blood pressure tolerates  *Intermittent hemoptysis.   Blood streaks with sputum.  I suspect this is due to her emphysema.  Reviewed CT scan from August which showed  no masses or tumors.  She does have outpatient follow-up with pulmonary.  Discussed this with patient in detail.  *COPD.  Continue home inhalers, nebulizers as needed.  *Hypertension.  Continue home medications  *CAD.  Stable.  No chest pain.  DVT prophylaxis with Lovenox  All the records are reviewed and case discussed with Care Management/Social Worker Management plans discussed with the patient, family and they are in agreement.  CODE STATUS: FULL CODE  TOTAL TIME TAKING CARE OF THIS PATIENT: 30 minutes.   POSSIBLE D/C IN 2-3 DAYS, DEPENDING ON CLINICAL CONDITION.  [name] M.D on 07/18/2019 at 10:30 AM  Between 7am to 6pm - Pager - [phone]  After 6pm go to [email or web address] - password [email or web address] [place] [facility]  Office  [phone]  CC: Primary care physician; [name], MD  Note: This dictation was prepared with Dragon dictation along with smaller phrase technology. Any transcriptional errors that result from this process are unintentional.

## 2019-07-18 NOTE — Plan of Care (Signed)
Educated pt on the importance of weighing herself every morning, per pt has a scale at home, also educated on diet, pt stated she has watched the CHF emmi videos.  Problem: Education: Goal: Knowledge of General Education information will improve Description: Including pain rating scale, medication(s)/side effects and non-pharmacologic comfort measures Outcome: Progressing   Problem: Nutrition: Goal: Adequate nutrition will be maintained Outcome: Progressing   Problem: Elimination: Goal: Will not experience complications related to bowel motility Outcome: Progressing

## 2019-07-18 NOTE — Progress Notes (Signed)
On call Mansy notified of K decrease to 3.4 on this morning's labs. Orders for oral replacement and Mg draw ordered.

## 2019-07-19 LAB — BASIC METABOLIC PANEL
Anion gap: 11 (ref 5–15)
BUN: 37 mg/dL — ABNORMAL HIGH (ref 8–23)
CO2: 25 mmol/L (ref 22–32)
Calcium: 9.4 mg/dL (ref 8.9–10.3)
Chloride: 103 mmol/L (ref 98–111)
Creatinine, Ser: 1.11 mg/dL — ABNORMAL HIGH (ref 0.44–1.00)
GFR calc Af Amer: 60 mL/min — ABNORMAL LOW (ref >60)
GFR calc non Af Amer: 52 mL/min — ABNORMAL LOW (ref >60)
Glucose, Bld: 133 mg/dL — ABNORMAL HIGH (ref 70–99)
Potassium: 3.8 mmol/L (ref 3.5–5.1)
Sodium: 139 mmol/L (ref 135–145)

## 2019-07-19 MED ORDER — ENTRESTO 24-26 MG PO TABS: 1.0000 | ORAL_TABLET | Freq: Two times a day (BID) | ORAL | 0 refills | Status: DC

## 2019-07-19 MED ORDER — CARVEDILOL 3.125 MG PO TABS: 3.1250 mg | ORAL_TABLET | Freq: Two times a day (BID) | ORAL | 0 refills | Status: DC

## 2019-07-19 MED ORDER — FUROSEMIDE 40 MG PO TABS: ORAL_TABLET | ORAL | 0 refills | Status: DC

## 2019-07-19 NOTE — Plan of Care (Signed)
  Problem: Education: Goal: Knowledge of General Education information will improve Description: Including pain rating scale, medication(s)/side effects and non-pharmacologic comfort measures Outcome: Progressing   Problem: Clinical Measurements: Goal: Respiratory complications will improve Outcome: Progressing   

## 2019-07-19 NOTE — Discharge Summary (Signed)
[facility] [place] NAME: Ashley [name]    MR#:  [id number]  DATE OF BIRTH:  [birth year]  DATE OF ADMISSION:  07/16/2019 ADMITTING PHYSICIAN: Hillary Bow, MD  DATE OF DISCHARGE: 07/19/2019 11:31 AM  PRIMARY CARE PHYSICIAN: [name], MD    ADMISSION DIAGNOSIS:  Acute pulmonary edema (HCC) [J81.0] Acute respiratory failure with hypoxia ([place]) [J96.01] CHF (congestive heart failure) ([place]) [I50.9]  DISCHARGE DIAGNOSIS:  Active Problems:   CHF (congestive heart failure) ([place])   SECONDARY DIAGNOSIS:   Past Medical History:  Diagnosis Date  . AICD (automatic cardioverter/defibrillator) present    on right side  . Anemia   . CAD (coronary artery disease)    s/p CABG  . CHF (congestive heart failure) ([place])   . Chronic kidney disease    renal artery stenosis  . Colon cancer ([place])   . Depression   . GERD (gastroesophageal reflux disease)   . Hx of colonic polyps   . Hyperlipidemia   . Hypertension   . Mitral valve disorder    s/p mitral valve repair wth CABG  . Myocardial infarction ([place])   . Peripheral vascular disease ([place])   . Presence of permanent cardiac pacemaker    Pacemaker/ Defibrillator    HOSPITAL COURSE:   1.  Acute hypoxic respiratory failure.  Patient was on oxygen and tapered off oxygen at this point. 2.  Acute on chronic systolic congestive heart failure.  Patient also has moderate to severe MR.  Most recent echocardiogram shows an ejection fraction of 45%.  Patient on low-dose Coreg, Entresto and low-dose Aldactone.  Patient states that she wants to remain on Lasix twice a day at this point. 3.  COPD.  Respiratory status stable.  No wheezing.  Continue home inhalers and nebulizers. 4.  Intermittent hemoptysis.  Follow-up with pulmonary as outpatient recent CT scan from August showed no masses or tumors. 5.  Hypertension.  Blood pressure on the lower side. 6.  CAD.  Stable.  No complaints of chest pain 7.   Chronic kidney disease stage III.  Watch with diuresis.  DISCHARGE CONDITIONS:   Satisfactory  CONSULTS OBTAINED:  Cardiology  DRUG ALLERGIES:   Allergies  Allergen Reactions  . Hydrocodone-Acetaminophen Nausea Only    DISCHARGE MEDICATIONS:   Allergies as of 07/19/2019      Reactions   Hydrocodone-acetaminophen Nausea Only      Medication List    TAKE these medications   acetaminophen 500 MG tablet Commonly known as: TYLENOL Maximum 6 tablets a day. What changed:   how much to take  how to take this  when to take this  reasons to take this   albuterol 108 (90 Base) MCG/ACT inhaler Commonly known as: Ventolin HFA INHALE 2 PUFFS BY MOUTH INTO THE LUNGS EVERY 6 HOURS AS NEEDED FOR WHEEZING   ALPRAZolam 0.5 MG tablet Commonly known as: XANAX Take 0.5-1 tablets (0.25-0.5 mg total) by mouth at bedtime. TAKE ONE TABLET BY MOUTH AT BEDTIME AS NEEDED FOR ANXIETY   aspirin 81 MG tablet Take 81 mg by mouth daily.   atorvastatin 40 MG tablet Commonly known as: LIPITOR Take 1 tablet (40 mg total) by mouth daily.   carvedilol 3.125 MG tablet Commonly known as: COREG Take 1 tablet (3.125 mg total) by mouth 2 (two) times daily with a meal. What changed:   medication strength  how much to take  how to take this  when to take this  additional  instructions   clopidogrel 75 MG tablet Commonly known as: PLAVIX Take 1 tablet (75 mg total) by mouth daily.   cyanocobalamin 1000 MCG/ML injection Commonly known as: (VITAMIN B-12) Inject 1 mL weekly into the muscle monthly   Diphenhist 25 mg capsule Generic drug: diphenhydrAMINE Take 25 mg by mouth at bedtime as needed.   Entresto 24-26 MG Generic drug: sacubitril-valsartan Take 1 tablet by mouth 2 (two) times daily.   escitalopram 10 MG tablet Commonly known as: LEXAPRO Take 1 tablet (10 mg total) by mouth daily.   ferrous sulfate 325 (65 FE) MG EC tablet Take 325 mg by mouth daily with breakfast.    furosemide 40 MG tablet Commonly known as: LASIX One tablet twice a day What changed: additional instructions   ipratropium-albuterol 0.5-2.5 (3) MG/3ML Soln Commonly known as: DUONEB Take 3 mLs by nebulization every 6 (six) hours as needed.   nitroGLYCERIN 0.4 MG SL tablet Commonly known as: NITROSTAT Place 1 tablet (0.4 mg total) under the tongue every 5 (five) minutes as needed for chest pain.   omeprazole 40 MG capsule Commonly known as: PRILOSEC Take 1 capsule (40 mg total) by mouth daily.   potassium chloride 10 MEQ tablet Commonly known as: K-DUR Take 1 tablet (10 mEq total) by mouth daily.   Spiriva Respimat 2.5 MCG/ACT Aers Generic drug: Tiotropium Bromide Monohydrate Inhale 5 mcg into the lungs every morning.   spironolactone 25 MG tablet Commonly known as: Aldactone Take 0.5 tablets (12.5 mg total) by mouth daily.   traZODone 50 MG tablet Commonly known as: DESYREL Take 0.5-1 tablets (25-50 mg total) by mouth daily. One hour before bedtime            Durable Medical Equipment  (From admission, onward)         Start     Ordered   07/17/19 1506  For home use only DME Bedside commode  Once    Question:  Patient needs a bedside commode to treat with the following condition  Answer:  Shortness of breath   07/17/19 1505           DISCHARGE INSTRUCTIONS:   Follow-up PMD 5 days Follow-up cardiology 1 week Follow-up CHF clinic  If you experience worsening of your admission symptoms, develop shortness of breath, life threatening emergency, suicidal or homicidal thoughts you must seek medical attention immediately by calling 911 or calling your MD immediately  if symptoms less severe.  You Must read complete instructions/literature along with all the possible adverse reactions/side effects for all the Medicines you take and that have been prescribed to you. Take any new Medicines after you have completely understood and accept all the possible adverse  reactions/side effects.   Please note  You were cared for by a hospitalist during your hospital stay. If you have any questions about your discharge medications or the care you received while you were in the hospital after you are discharged, you can call the unit and asked to speak with the hospitalist on call if the hospitalist that took care of you is not available. Once you are discharged, your primary care physician will handle any further medical issues. Please note that NO REFILLS for any discharge medications will be authorized once you are discharged, as it is imperative that you return to your primary care physician (or establish a relationship with a primary care physician if you do not have one) for your aftercare needs so that they can reassess your need for medications and  monitor your lab values.    Today   CHIEF COMPLAINT:   Chief Complaint  Patient presents with  . Respiratory Distress    HISTORY OF PRESENT ILLNESS:  Ashley [name]  is a 68 y.o. female came in with respiratory distress   VITAL SIGNS:  Blood pressure 119/68, pulse 83, temperature (!) 97.5 F (36.4 C), temperature source Oral, resp. rate 18, height 5\' 4"  (1.626 m), weight 70.7 kg, SpO2 98 %.  I/O:    Intake/Output Summary (Last 24 hours) at 07/19/2019 1411 Last data filed at 07/19/2019 1022 Gross per 24 hour  Intake 1440 ml  Output 1900 ml  Net -460 ml    PHYSICAL EXAMINATION:  GENERAL:  68 y.o.-year-old patient lying in the bed with no acute distress.  EYES: Pupils equal, round, reactive to light and accommodation. No scleral icterus. Extraocular muscles intact.  HEENT: Head atraumatic, normocephalic. Oropharynx and nasopharynx clear.  NECK:  Supple, no jugular venous distention. No thyroid enlargement, no tenderness.  LUNGS: Normal breath sounds bilaterally, no wheezing, rales,rhonchi or crepitation. No use of accessory muscles of respiration.  CARDIOVASCULAR: S1, S2 normal.  2 and a 6  systolic murmurs.no rubs, or gallops.  ABDOMEN: Soft, non-tender, non-distended. Bowel sounds present. No organomegaly or mass.  EXTREMITIES: No pedal edema, cyanosis, or clubbing.  NEUROLOGIC: Cranial nerves II through XII are intact. Muscle strength 5/5 in all extremities. Sensation intact. Gait not checked.  PSYCHIATRIC: The patient is alert and oriented x 3.  SKIN: No obvious rash, lesion, or ulcer.   DATA REVIEW:   CBC Recent Labs  Lab 07/17/19 0454  WBC 12.6*  HGB 10.1*  HCT 30.2*  PLT 312    Chemistries  Recent Labs  Lab 07/18/19 0558 07/19/19 0456  NA 140 139  K 3.4* 3.8  CL 102 103  CO2 27 25  GLUCOSE 139* 133*  BUN 39* 37*  CREATININE 1.27* 1.11*  CALCIUM 9.0 9.4  MG 2.1  --     Microbiology Results  Results for orders placed or performed during the hospital encounter of 07/16/19  SARS Coronavirus 2 [facility] order, Performed in [facility] hospital lab) Nasopharyngeal Nasopharyngeal Swab     Status: None   Collection Time: 07/16/19 12:31 PM   Specimen: Nasopharyngeal Swab  Result Value Ref Range Status   SARS Coronavirus 2 NEGATIVE NEGATIVE Final    Comment: (NOTE) If result is NEGATIVE SARS-CoV-2 target nucleic acids are NOT DETECTED. The SARS-CoV-2 RNA is generally detectable in upper and lower  respiratory specimens during the acute phase of infection. The lowest  concentration of SARS-CoV-2 viral copies this assay can detect is 250  copies / mL. A negative result does not preclude SARS-CoV-2 infection  and should not be used as the sole basis for treatment or other  patient management decisions.  A negative result may occur with  improper specimen collection / handling, submission of specimen other  than nasopharyngeal swab, presence of viral mutation(s) within the  areas targeted by this assay, and inadequate number of viral copies  (<250 copies / mL). A negative result must be combined with clinical  observations, patient history, and  epidemiological information. If result is POSITIVE SARS-CoV-2 target nucleic acids are DETECTED. The SARS-CoV-2 RNA is generally detectable in upper and lower  respiratory specimens dur ing the acute phase of infection.  Positive  results are indicative of active infection with SARS-CoV-2.  Clinical  correlation with patient history and other diagnostic information is  necessary to determine patient  infection status.  Positive results do  not rule out bacterial infection or co-infection with other viruses. If result is PRESUMPTIVE POSTIVE SARS-CoV-2 nucleic acids MAY BE PRESENT.   A presumptive positive result was obtained on the submitted specimen  and confirmed on repeat testing.  While 2019 novel coronavirus  (SARS-CoV-2) nucleic acids may be present in the submitted sample  additional confirmatory testing may be necessary for epidemiological  and / or clinical management purposes  to differentiate between  SARS-CoV-2 and other Sarbecovirus currently known to infect humans.  If clinically indicated additional testing with an alternate test  methodology [phone]) is advised. The SARS-CoV-2 RNA is generally  detectable in upper and lower respiratory sp ecimens during the acute  phase of infection. The expected result is Negative. Fact Sheet for Patients:  StrictlyIdeas.no Fact Sheet for Healthcare Providers: BankingDealers.co.za This test is not yet approved or cleared by the [place] FDA and has been authorized for detection and/or diagnosis of SARS-CoV-2 by FDA under an Emergency Use Authorization (EUA).  This EUA will remain in effect (meaning this test can be used) for the duration of the COVID-19 declaration under Section 564(b)(1) of the Act, 21 U.S.C. section 360bbb-3(b)(1), unless the authorization is terminated or revoked sooner. Performed at [facility], [address], [place], [place] [address]       Management plans discussed with the patient, and she is in agreement.  CODE STATUS:     Code Status Orders  (From admission, onward)         Start     Ordered   07/16/19 1508  Full code  Continuous     07/16/19 1508        Code Status History    Date Active Date Inactive Code Status Order ID Comments User Context   06/19/2019 1430 06/21/2019 1804 Full Code [id number]  Henreitta Leber, MD ED   05/27/2019 0630 05/31/2019 2111 Full Code [id number]  Mansy, Arvella Merles, MD ED   03/16/2019 1354 03/17/2019 1617 Full Code [id number]  Gorden Harms, MD Inpatient   07/17/2018 1214 07/19/2018 1548 Full Code [id number]  Dustin Flock, MD ED   10/23/2016 0917 10/23/2016 1630 Full Code [id number]  Algernon Huxley, MD Inpatient   06/21/2016 0437 06/21/2016 1758 Full Code [id number]  Harrie Foreman, MD Inpatient   Advance Care Planning Activity      TOTAL TIME TAKING CARE OF THIS PATIENT: 35 minutes.    Loletha Grayer M.D on 07/19/2019 at 2:11 PM  Between 7am to 6pm - Pager - [phone]  After 6pm go to [email or web address] - password [email or web address]  [employer] Office  [phone]  CC: Primary care physician; [name], MD

## 2019-07-19 NOTE — Plan of Care (Signed)

## 2019-07-19 NOTE — Progress Notes (Signed)
Patient given discharge instructions. Patient verbalized understanding without any questions or concerns. IV taken out and tele monitor off. Patient going home in stable condition.

## 2019-07-21 ENCOUNTER — Other Ambulatory Visit: Payer: Self-pay | Admitting: *Deleted

## 2019-07-21 NOTE — Patient Outreach (Signed)
[facility]) Care Management  07/21/2019  Ashley [name] [birth year] [id number]   Covering for assigned care manager, notified that member was discharged on 9/12 after being readmitted for CHF.  Primary MD office will complete TOC assessment.  Call placed to member, identity verified.  This care manager introduced self and stated purpose of call.  She report she is doing better, denies any current shortness of breath or chest discomfort.  State she feel she was stressed due to her fiance being in the hospital.    Report she has not returned to smoking, family members smoke outside of the home.  She is using nebulizers and inhalers as instructed, confirms she is aware of medication change (Carvedilol and Furosemide).  Has multiple upcoming appointments: Pulmonology 9/17, Heart failure clinic 9/22, and PCP 9/24.    Denies any urgent concerns, will provide assigned care manager update to follow up within the next week as scheduled.  THN CM Care Plan Problem One     Most Recent Value  Care Plan Problem One  Risk for readmission related to heart failure management as evidenced by recent readmission  Role Documenting the Problem One  Care Management [place] for Problem One  Active  THN Long Term Goal   Member willnot be readmitted to hospital within the next 31 days  THN Long Term Goal Start Date  07/21/19  Interventions for Problem One Long Term Goal  Discharge instructions reviewed with member, Heart failure zones reviewed.  THN CM Short Term Goal #1   Member will report takinng medications as instructed over the nexft 4 weeks  THN CM Short Term Goal #1 Start Date  07/21/19  Interventions for Short Term Goal #1  Medications reviewed with member.  Educated on dose changes for carvedilol and Furosemide  THN CM Short Term Goal #2   Member will report keeping and attending follow up appointments within the nexft 2 weeks  THN CM Short Term Goal #2 Start Date   07/21/19  Interventions for Short Term Goal #2  Upcoming appointments reviewed wtih member, PCP, pulmonology, and HF clinic both scheduled.  Assessed need for transportation assistance, not needed     Valente David, Therapist, sports, MSN [place] [phone]

## 2019-07-22 ENCOUNTER — Telehealth: Payer: Self-pay | Admitting: Internal Medicine

## 2019-07-22 NOTE — Telephone Encounter (Signed)
Transition Care Management Follow-up Telephone Call  How have you been since you were released from the hospital? Patient breathing better since being home no SOB and    Do you understand why you were in the hospital?  Yes   Do you understand the discharge instrcutions? yes  Items Reviewed:  Medications reviewed: yes  Allergies reviewed: yes  Dietary changes reviewed: yes  Referrals reviewed: yes   Functional Questionnaire:   Activities of Daily Living (ADLs):   She states they are independent in the following: ambulation, bathing and hygiene, feeding, continence, grooming, toileting and dressing States they require assistance with the following: N o assistance required   Any transportation issues/concerns?: no   Any patient concerns? yes   Confirmed importance and date/time of follow-up visits scheduled: yes, Patient requesting refill on alprazolam last filled 02/24/2019 30 with 3 refills. Patient also requesting refill on spironolactone filled in hospital stay by Dr. Leslye Peer ok to fill?   Confirmed with patient if condition begins to worsen call PCP or go to the ER.  Patient was given the Call-a-Nurse line [phone]: yes

## 2019-07-24 ENCOUNTER — Other Ambulatory Visit: Payer: Self-pay

## 2019-07-24 ENCOUNTER — Ambulatory Visit (INDEPENDENT_AMBULATORY_CARE_PROVIDER_SITE_OTHER): Payer: Medicare Other | Admitting: Pulmonary Disease

## 2019-07-24 ENCOUNTER — Telehealth: Payer: Self-pay | Admitting: Pulmonary Disease

## 2019-07-24 ENCOUNTER — Other Ambulatory Visit: Payer: Self-pay | Admitting: Internal Medicine

## 2019-07-24 ENCOUNTER — Encounter: Payer: Self-pay | Admitting: Pulmonary Disease

## 2019-07-24 VITALS — BP 130/80 | HR 78 | Temp 98.2°F | Ht 64.0 in | Wt 156.0 lb

## 2019-07-24 DIAGNOSIS — I340 Nonrheumatic mitral (valve) insufficiency: Secondary | ICD-10-CM | POA: Diagnosis not present

## 2019-07-24 DIAGNOSIS — E7800 Pure hypercholesterolemia, unspecified: Secondary | ICD-10-CM

## 2019-07-24 DIAGNOSIS — J449 Chronic obstructive pulmonary disease, unspecified: Secondary | ICD-10-CM | POA: Diagnosis not present

## 2019-07-24 DIAGNOSIS — Z87891 Personal history of nicotine dependence: Secondary | ICD-10-CM | POA: Diagnosis not present

## 2019-07-24 DIAGNOSIS — I5022 Chronic systolic (congestive) heart failure: Secondary | ICD-10-CM | POA: Diagnosis not present

## 2019-07-24 MED ORDER — TRELEGY ELLIPTA 100-62.5-25 MCG/INH IN AEPB: 1.0000 | INHALATION_SPRAY | Freq: Every day | RESPIRATORY_TRACT | 0 refills | Status: AC

## 2019-07-24 MED ORDER — TRELEGY ELLIPTA 100-62.5-25 MCG/INH IN AEPB: 1.0000 | INHALATION_SPRAY | Freq: Every day | RESPIRATORY_TRACT | 5 refills | Status: AC

## 2019-07-24 NOTE — Progress Notes (Signed)
Subjective:    Patient ID: Ashley [name], female    DOB: [birth year], 68 y.o.   MRN: [id number]  HPI The patient is a 68 year old recent former smoker (quit August 13) who presents for evaluation of frequent exacerbations of what she interprets to be COPD.  She is kindly referred by Deborra Medina MD.  Patient used to see Dr. Stevenson Clinch last seen him in 2015.  She is currently on Spiriva and Brio Ellipta.  She states that over the last year she has had frequent exacerbations has been admitted at least 4 times to the hospital.  It appears that this is the case since May 2020.  However, looking at her admissions these have been triggered by congestive heart failure and not by COPD.  She has known systolic dysfunction with EF of 40 to 45% and has recently been noted to have severe mitral regurgitation as of the echo performed in July 2020.  She has a quite complex cardiac history with a non-STEMI in 2007, status post CABG x3 with mitral valve repair in 2007, ischemic cardiomyopathy, and status post Bi V ICD on 2017 at [facility].  As noted on her mitral regurgitation appears to be worse by the most recent echo of July 2020.  She has not had recent pulmonary function testing.  PFTs of December 2015 showed normal PFTs.  The patient states that she has been on Spiriva and Breo and that these medications help her with her breathing.  She states that she notes some wheezing on occasion.  When she is not in exacerbation she feels that her shortness of breath is well controlled.  She also uses nebulized DuoNeb as needed.  She has no chest pain.  She has occasional orthopnea.  No paroxysmal nocturnal dyspnea.  Past medical history, surgical history and family history has been reviewed.  Medications have been reviewed.  Social history the patient smoked 2 packs of cigarettes per day for approximately 50 years she quit as noted in August.  She has been on disability.  Review of Systems  Constitutional: Positive for fatigue.   HENT: Negative.   Eyes: Negative.   Respiratory: Positive for shortness of breath.   Cardiovascular: Positive for leg swelling.  Gastrointestinal: Negative.   Endocrine: Negative.   Genitourinary: Negative.   Musculoskeletal: Negative.   Skin: Negative.   Allergic/Immunologic: Negative.   Neurological: Negative.   Hematological: Negative.   Psychiatric/Behavioral: Negative.   All other systems reviewed and are negative.      Objective:   Physical Exam Vitals signs and nursing note reviewed.  Constitutional:      General: She is not in acute distress.    Appearance: Normal appearance.  HENT:     Head: Normocephalic.     Right Ear: External ear normal.     Left Ear: External ear normal.     Nose:     Comments: Nose/mouth/throat not examined due to masking requirements for COVID 19. Eyes:     General: No scleral icterus.    Conjunctiva/sclera: Conjunctivae normal.     Pupils: Pupils are equal, round, and reactive to light.  Neck:     Musculoskeletal: Neck supple.     Thyroid: No thyromegaly.     Trachea: Trachea and phonation normal.  Cardiovascular:     Rate and Rhythm: Normal rate and regular rhythm.     Heart sounds: Murmur present. Systolic murmur present with a grade of 3/6. No gallop.   Pulmonary:     Effort:  Pulmonary effort is normal. No respiratory distress.     Breath sounds: No wheezing, rhonchi or rales.     Comments: Coarse breath sounds Chest:     Comments: ICD placed on the right chest wall Abdominal:     General: There is no distension.  Musculoskeletal:     Right lower leg: No edema.     Left lower leg: No edema.  Skin:    General: Skin is warm and dry.  Neurological:     General: No focal deficit present.     Mental Status: She is alert and oriented to person, place, and time.  Psychiatric:        Mood and Affect: Mood normal.        Behavior: Behavior normal.     I have reviewed the patient's prior chest x-rays and most recent chest  x-ray was performed on July 16, 2019 and this showed cardiomegaly with congestive failure.  CT Angie of chest performed on 09 June 2019 showed no evidence of pulmonary embolism and some superimposed emphysema with perhaps evidence of bronchiolitis.      Assessment & Plan:   1.  Chronic obstructive pulmonary disease: Severity yet to be determined.  Patient will need pulmonary function testing this will be ordered.  It does not appear that her exacerbations are due to to COPD but likely to decompensations of congestive heart failure.  She may need [facility]evaluation at [facility] as she may need revision of mitral valve repair.  For now to try to simplify her respiratory medications will combine everything (Breo and Spiriva) into Trelegy Ellipta 1 inhalation daily.  Patient may continue using albuterol as needed.  2.  Congestive cardiomyopathy with chronic systolic heart failure: I have reviewed her hospital records and it appears that her exacerbations are due to this issue rather than to COPD exacerbations.  Again she may need reevaluation in this regard as it appears that she has significant mitral disease.  Continue management of her cardiac medications per cardiology.  3.  Mitral valve regurgitation: This issue adds complexity to her management, as above.  4.  Recent former smoker: She was commended on discontinuation of smoking.  She is still exposed to secondhand smoke significantly at home and it was advised that she limit exposure to secondhand smoke.  We will see the patient in follow-up after pulmonary function testing is completed.  We will be happy to see her in consultation if she is readmitted for exacerbation however the data that I have reviewed shows that clearly her exacerbations are due to cardiac issues.   Thank you for allowing me to participate in this patient's care.    This chart was dictated using voice recognition software/Dragon.  Despite best efforts  to proofread, errors can occur which can change the meaning.  Any change was purely unintentional.

## 2019-07-24 NOTE — Telephone Encounter (Signed)
Called and spoke to [facility] with [employer], and made her aware that Rx should be for 30 days.  Nothing further is needed.

## 2019-07-24 NOTE — Patient Instructions (Signed)
1.  We will switch your inhaler to Trelegy Ellipta which is a combination of your Breo and the Spiriva.  2.  We will schedule breathing tests.  3.  We will see you in follow-up in 4 to 6 weeks time.  Call us sooner if you have any new issues.  4.  Congratulations on quitting smoking.

## 2019-07-25 ENCOUNTER — Telehealth: Payer: Self-pay | Admitting: Internal Medicine

## 2019-07-25 DIAGNOSIS — Z79899 Other long term (current) drug therapy: Secondary | ICD-10-CM

## 2019-07-25 NOTE — Telephone Encounter (Signed)
Medication: spironolactone (ALDACTONE) 25 MG tablet [[id number]]   Has the patient contacted their pharmacy? Yes  (Agent: If no, request that the patient contact the pharmacy for the refill.) (Agent: If yes, when and what did the pharmacy advise?)  Preferred Pharmacy (with phone number or street name): [place], [place] - [place] [phone] (Phone) [phone] (Fax)    Agent: Please be advised that RX refills may take up to 3 business days. We ask that you follow-up with your pharmacy.

## 2019-07-28 ENCOUNTER — Other Ambulatory Visit: Payer: Self-pay

## 2019-07-28 MED ORDER — SPIRONOLACTONE 25 MG PO TABS: 12.5000 mg | ORAL_TABLET | Freq: Every day | ORAL | 1 refills | Status: DC

## 2019-07-28 NOTE — Telephone Encounter (Signed)
ALDACTONE REFILLED, BUT SHE NEEDS A LAB APPOINTMENT FOR A BMET  SCHEDULED TO CHECK POTASSIUM

## 2019-07-28 NOTE — Telephone Encounter (Signed)
Patient had appointment in 9/24 & would just like to have checked then. I stated that should be fine, so she didn't have to come to the office twice.

## 2019-07-28 NOTE — Progress Notes (Deleted)
Patient ID: Ashley [name], female    DOB: 1951/07/22, 68 y.o.   MRN: [id number]  HPI  Ashley [name] is a 68 y/o female with a history of CAD (CABG), hyperlipidemia, HTN, CKD, GERD, depression, COPD, PVD, current tobacco use and chronic heart failure.   Echo report from 05/27/2019 reviewed and showed an EF of 40-45% along with moderate/ severe MR> Echo report from 08/22/18 reviewed and showed an EF of 30% along with moderate MR. Echo report from 06/21/16 reviewed and showed an EF of 55-60% along with mild AR and moderate MR.   Admitted 07/16/2019 due to acute on chronic HF. Medications adjusted. Discharged after 3 days.  Admitted 06/19/2019 due to acute HF. Cardiology consult obtained. Initially needed bipap and then transitioned to room air. IV diuresis done with transition to oral diuretics. Discharged after 2 days. Admitted 05/27/2019 due to acute on chronic HF. Initially given IV lasix and then transitioned to oral diuretics. Levaquin given for salmonella. Cardiology consult obtained. Discharged after 4 days.   She presents today for a follow-up visit with a chief complaint of   Past Medical History:  Diagnosis Date  . AICD (automatic cardioverter/defibrillator) present    on right side  . Anemia   . CAD (coronary artery disease)    s/p CABG  . CHF (congestive heart failure) ([place])   . Chronic kidney disease    renal artery stenosis  . Colon cancer ([place])   . Depression   . GERD (gastroesophageal reflux disease)   . Hx of colonic polyps   . Hyperlipidemia   . Hypertension   . Mitral valve disorder    s/p mitral valve repair wth CABG  . Myocardial infarction ([place])   . Peripheral vascular disease ([place])   . Presence of permanent cardiac pacemaker    Pacemaker/ Defibrillator   Past Surgical History:  Procedure Laterality Date  . arm surgery     fracture, has plates and screws  . CABG with mitral valve repair    . CARDIAC CATHETERIZATION    . COLON SURGERY     colon cancer  .  COLONOSCOPY WITH PROPOFOL N/A 10/09/2016   Procedure: COLONOSCOPY WITH PROPOFOL;  Surgeon: Jonathon Bellows, MD;  Location: [facility] ENDOSCOPY;  Service: Endoscopy;  Laterality: N/A;  . COLONOSCOPY WITH PROPOFOL N/A 07/24/2018   Procedure: COLONOSCOPY WITH PROPOFOL;  Surgeon: Virgel Manifold, MD;  Location: [facility] ENDOSCOPY;  Service: Endoscopy;  Laterality: N/A;  . CORONARY ARTERY BYPASS GRAFT    . ENTEROSCOPY N/A 11/21/2018   Procedure: ENTEROSCOPY-BALLOON;  Surgeon: Jonathon Bellows, MD;  Location: [facility] ENDOSCOPY;  Service: Gastroenterology;  Laterality: N/A;  . ESOPHAGOGASTRODUODENOSCOPY (EGD) WITH PROPOFOL N/A 07/24/2018   Procedure: ESOPHAGOGASTRODUODENOSCOPY (EGD) WITH PROPOFOL;  Surgeon: Virgel Manifold, MD;  Location: [facility] ENDOSCOPY;  Service: Endoscopy;  Laterality: N/A;  . GIVENS CAPSULE STUDY N/A 08/16/2018   Procedure: GIVENS CAPSULE STUDY;  Surgeon: Virgel Manifold, MD;  Location: [facility] ENDOSCOPY;  Service: Endoscopy;  Laterality: N/A;  . pace maker defib  2016  . PERIPHERAL VASCULAR CATHETERIZATION N/A 10/23/2016   Procedure: Renal Angiography;  Surgeon: Algernon Huxley, MD;  Location: [facility];  Service: Cardiovascular;  Laterality: N/A;  . TOTAL ABDOMINAL HYSTERECTOMY  1999   history of abnormal pap   Family History  Problem Relation Age of Onset  . Arthritis Mother   . Cancer Mother        uterus cancer  . Hyperlipidemia Mother   . Hypertension Mother   .  Heart disease Mother   . Diabetes Mother   . Hyperlipidemia Father   . Hypertension Father   . Heart disease Father   . Diabetes Father   . Cancer Sister        ovary cancer  . Diabetes Maternal Grandmother   . Hypertension Maternal Grandmother   . Arthritis Maternal Grandmother   . Hypertension Maternal Grandfather   . Hypertension Paternal Grandmother   . Hypertension Paternal Grandfather   . Heart disease Paternal Grandfather   . Heart disease Brother   . Kidney disease Brother   . Breast cancer Neg Hx     Social History   Tobacco Use  . Smoking status: Former Smoker    Packs/day: 0.25    Types: Cigarettes    Quit date: 06/19/2019    Years since quitting: 0.1  . Smokeless tobacco: Never Used  . Tobacco comment: reports smoking 2-3 cigarettes/ day  Substance Use Topics  . Alcohol use: Yes    Alcohol/week: 2.0 standard drinks    Types: 2 Shots of liquor per week   Allergies  Allergen Reactions  . Hydrocodone-Acetaminophen Nausea Only     Review of Systems  Constitutional: Positive for fatigue (easily). Negative for appetite change.  HENT: Negative for congestion, postnasal drip and sore throat.   Eyes: Negative.   Respiratory: Positive for cough (productive of clear mucus), shortness of breath (with little exertion) and wheezing (at times). Negative for chest tightness.   Cardiovascular: Negative for chest pain, palpitations and leg swelling.  Gastrointestinal: Negative for abdominal distention and abdominal pain.  Endocrine: Negative.   Genitourinary: Negative.   Musculoskeletal: Negative for back pain and neck pain.  Skin: Negative.   Allergic/Immunologic: Negative.   Neurological: Positive for light-headedness (when changing positions too quickly). Negative for dizziness.  Hematological: Negative for adenopathy. Does not bruise/bleed easily.  Psychiatric/Behavioral: Negative for dysphoric mood and sleep disturbance (sleeping on 1 pillow). The patient is not nervous/anxious.       Physical Exam Vitals signs and nursing note reviewed.  Constitutional:      Appearance: She is well-developed.  HENT:     Head: Normocephalic and atraumatic.  Neck:     Musculoskeletal: Normal range of motion and neck supple.     Vascular: No JVD.  Cardiovascular:     Rate and Rhythm: Normal rate and regular rhythm.  Pulmonary:     Effort: Pulmonary effort is normal. No respiratory distress.     Breath sounds: No wheezing or rales.  Abdominal:     General: There is no distension.      Palpations: Abdomen is soft.  Musculoskeletal:     Right lower leg: She exhibits no tenderness. No edema.     Left lower leg: She exhibits no tenderness. No edema.  Skin:    General: Skin is warm and dry.  Neurological:     Mental Status: She is alert and oriented to person, place, and time.  Psychiatric:        Behavior: Behavior normal.    Assessment & Plan:  1: Chronic heart failure with reduced ejection fraction- - NYHA class III - euvolemic today - weighing daily and she was reminded to call for an overnight weight gain of >2 pounds or a weekly weight gain of >5 pounds - weight 164,4 pounds from last visit here 6 weeks ago - not adding salt and has been reading food labels. Reviewed the importance of closely following a 2000mg  sodium diet  - saw cardiology (  Paraschos) 11/11/2018 - BNP 07/16/2019 was >4500.0  2: HTN- - BP  - saw PCP (Guse) 07/03/2019 - BMP done 07/19/2019 reviewed and showed sodium 139, potassium 3.8, creatinine 1.11 and GFR 52  3: Tobacco use- - smoking 2 cigarettes daily - complete cessation discussed for 3 minutes with her. - saw pulmonology Patsey Berthold) 07/24/2019  Patient did not bring her medications nor a list. Each medication was verbally reviewed with the patient and she was encouraged to bring the bottles to every visit to confirm accuracy of list.

## 2019-07-29 ENCOUNTER — Telehealth: Payer: Self-pay | Admitting: Family

## 2019-07-29 ENCOUNTER — Encounter: Payer: Self-pay | Admitting: *Deleted

## 2019-07-29 ENCOUNTER — Other Ambulatory Visit: Payer: Self-pay | Admitting: *Deleted

## 2019-07-29 ENCOUNTER — Ambulatory Visit: Payer: Medicare Other | Admitting: Family

## 2019-07-29 NOTE — Telephone Encounter (Signed)
Patient did not show for her Heart Failure Clinic appointment on 07/29/2019. Will attempt to reschedule.

## 2019-07-29 NOTE — Patient Outreach (Signed)
[facility]) [place] Telephone Outreach PCP completes Transition of Care follow up post-hospital discharge Post (most recent)- hospital discharge day # 10 Unsuccessful consecutive telephone outreach attempt # 1- previously engaged patient  07/29/2019  Ashley [name] [birth year] [id number]  11:55 am:  Unsuccessfultelephone outreach to [name], 68 y/o femalereferred to [place] by [facility] Liaison RN CM after recent hospitalization July 21-25, 2020 for acute on chronic CHF exacerbation.Patientwas discharged from hospital to home/ self-care without home health services in place. Patient has history including, but not limited to, combined CHF; CAD with previous MI/ CABG, and ICD; HTN/ HLD; GERD; COPD; tobacco use.  Unfortunately, patient experienced (2) recent hospital readmissions: 1) August 13-15, 2020 for acute respiratory failure with hypoxia/ 18 days after her hospital discharge, ; patient was again discharged home without home health services in place. 2) September 9-12, 2020 for acute pulmonary edema; respiratory failure with hypoxia; and CHF exacerbation/ 25 days after previous hospital discharge; was again discharged home without home health services in place  HIPAA compliant voice mail message left for patient, requesting return call back.  Plan:  Will re-attempt [place] telephone outreach later this week if I do not hear back from patient first.  [name], RN, BSN, [place] Coordinator [facility] Care Management  [phone]

## 2019-07-31 ENCOUNTER — Inpatient Hospital Stay: Payer: Medicare Other | Admitting: Internal Medicine

## 2019-07-31 ENCOUNTER — Ambulatory Visit: Payer: Medicare Other | Admitting: Pharmacist

## 2019-07-31 DIAGNOSIS — J9611 Chronic respiratory failure with hypoxia: Secondary | ICD-10-CM

## 2019-07-31 DIAGNOSIS — I5043 Acute on chronic combined systolic (congestive) and diastolic (congestive) heart failure: Secondary | ICD-10-CM

## 2019-07-31 DIAGNOSIS — J9612 Chronic respiratory failure with hypercapnia: Secondary | ICD-10-CM

## 2019-07-31 NOTE — Chronic Care Management (AMB) (Signed)
Chronic Care Management   Follow Up Note   07/31/2019 Name: OCTA UPLINGER MRN: [id number] DOB: [birth year]  Referred by: [name], MD Reason for referral : Chronic Care Management (Medication Management)   CORINDA AMMON is a 68 y.o. year old female who is a primary care patient of [name], [name], MD. The CCM team was consulted for assistance with chronic disease management and care coordination needs.    Contacted patient for medication management follow up.   Review of patient status, including review of consultants reports, relevant laboratory and other test results, and collaboration with appropriate care team members and the patient's provider was performed as part of comprehensive patient evaluation and provision of chronic care management services.    SDOH (Social Determinants of Health) screening performed today: Stress. See Care Plan for related entries.   Advanced Directives Status: N See Care Plan and [place] application for related entries.  Outpatient Encounter Medications as of 07/31/2019  Medication Sig Note  . acetaminophen (TYLENOL) 500 MG tablet Maximum 6 tablets a day. (Patient taking differently: Take 500 mg by mouth every 4 (four) hours as needed for fever. Maximum 6 tablets a day.)   . albuterol (VENTOLIN HFA) 108 (90 Base) MCG/ACT inhaler INHALE 2 PUFFS BY MOUTH INTO THE LUNGS EVERY 6 HOURS AS NEEDED FOR WHEEZING 07/03/2019: Using 1-2 times daily currently  . ALPRAZolam (XANAX) 0.5 MG tablet Take 0.5-1 tablets (0.25-0.5 mg total) by mouth at bedtime. TAKE ONE TABLET BY MOUTH AT BEDTIME AS NEEDED FOR ANXIETY   . aspirin 81 MG tablet Take 81 mg by mouth daily.   [name] atorvastatin (LIPITOR) 40 MG tablet TAKE ONE TABLET BY MOUTH ONCE DAILY   . carvedilol (COREG) 3.125 MG tablet Take 1 tablet (3.125 mg total) by mouth 2 (two) times daily with a meal.   . clopidogrel (PLAVIX) 75 MG tablet Take 1 tablet (75 mg total) by mouth daily.   . cyanocobalamin (,VITAMIN  B-12,) 1000 MCG/ML injection Inject 1 mL weekly into the muscle monthly 07/31/2019: Monthly  . diphenhydrAMINE (DIPHENHIST) 25 mg capsule Take 25 mg by mouth at bedtime as needed.    [name] escitalopram (LEXAPRO) 10 MG tablet Take 1 tablet (10 mg total) by mouth daily.   . ferrous sulfate 325 (65 FE) MG EC tablet Take 325 mg by mouth daily with breakfast.   . Fluticasone-Umeclidin-Vilant (TRELEGY ELLIPTA) 100-62.5-25 MCG/INH AEPB Inhale 1 puff into the lungs daily.   . furosemide (LASIX) 40 MG tablet One tablet twice a day   . ipratropium-albuterol (DUONEB) 0.5-2.5 (3) MG/3ML SOLN Take 3 mLs by nebulization every 6 (six) hours as needed.   . nitroGLYCERIN (NITROSTAT) 0.4 MG SL tablet Place 1 tablet (0.4 mg total) under the tongue every 5 (five) minutes as needed for chest pain.   [name] omeprazole (PRILOSEC) 40 MG capsule Take 1 capsule (40 mg total) by mouth daily.   . potassium chloride (K-DUR) 10 MEQ tablet Take 1 tablet (10 mEq total) by mouth daily.   . sacubitril-valsartan (ENTRESTO) 24-26 MG Take 1 tablet by mouth 2 (two) times daily.   [name] spironolactone (ALDACTONE) 25 MG tablet Take 0.5 tablets (12.5 mg total) by mouth daily.   . traZODone (DESYREL) 50 MG tablet Take 0.5-1 tablets (25-50 mg total) by mouth daily. One hour before bedtime    No facility-administered encounter medications on file as of 07/31/2019.      Goals Addressed            This  Visit's Progress     Patient Stated   . COMPLETED: "I can't afford my medications and I'm out" (pt-stated)       Current Barriers:  . Financial concerns - RESOLVED; patient was approved for Medicare Extra Help, and was able to re-initiate her Medicare Part D plan.  Addison Bailey her brand name medications are $8.95 and generics are $3.60  Pharmacist Clinical Goal(s):  [name] Over the next 30 days, patient will work with PharmD to address needs related to medication access  Interventions: . Discussed with patient; she is now filling everything at [facility]. She declines needing any refills at this time. We discussed pill packaging available at [facility], however, she notes that she does not like blister packs and is not interested in this at this time.   Patient Self Care Activities:  . Self administers medications as prescribed . Calls pharmacy for medication refills  Please see past updates related to this goal by clicking on the "Past Updates" button in the selected goal      . "I want to stay out of the hospital" (pt-stated)       Current Barriers:  . Polypharmacy; complex patient with multiple comorbidities including HFrEF, COPD, CAD (hx NSTEMI, s/p CABG x3 and mitral valve repair in 2007), continued severe mitral valve regurgitation; barrett's esophagus, depression . Self-manages medications by filling a BID pill box . Readmitted to [facility] 9/9-9/10/2019 for CHF exacerbation and COPD exacerbation, believed to be related to mitral valve regurg . COPD: Since that time, established with pulmonary, saw Dr. Patsey Berthold, who consolidated inhaler regimen to Trelegy daily; patient confirms adherence o Notes she has not needed albuterol HFA or nebulizer as often since starting Trelegy. . CHF: HFrEF; managed on Entresto 49/51 BID, spironolactone 12.5 mg BID, carvedilol 3.125 mg BID, furosemide 40 mg BID (taking in AM and PM) o Confirms weighing daily; reports 156-158 lbs. Confirms knowledge to call HF clinic/cardiology with weight gain >3 lbs/day or 5 lbs/week o Notes she does not have f/u with Dr. Saralyn Pilar scheduled, and that the office will contact her when she's due for f/u o Missed CHF appointment on 9/22, notes that her car wouldn't start  . Insomnia: notes taking trazodone 50 mg QPM, alprazolam 0.25 mg QPM, and diphenhydramine 25 mg QPM for sleep.   Pharmacist Clinical Goal(s):  [name] Over the next 90 days, patient will work with PharmD and provider towards optimized medication management  Interventions: . Comprehensive medication  review performed; medication list updated in electronic medical record . Reviewed importance of inhaler adherence with patient. Reviewed that she has PFTs and f/u w/ Dr. Patsey Berthold next month. Patient verbalized understanding. . Reviewed daily weights and parameters to seek medical instruction. Patient has not seen Dr. Saralyn Pilar since 11/2018. Will collaborate w/CHF clinic to see when patient should f/u w/ primary cardiologist, especially considering question of mitral valve regurg and affect on frequent rehospitalizations . Counseled patient to take second furosemide dose in the afternoon, instead of evening, due to half life of furosemide and to prevent nocturia interrupting sleep. Patient verbalized understanding.  . Reinforced importance of f/u with HF clinic. Patient verbalized understanding . Moving forward, will further address the risk of multiple CNS sedating medications, especially given patient's baseline respiratory problems.   Patient Self Care Activities:  . Patient will take medications as prescribed . Patient will attend follow up appointments as scheduled.   Initial goal documentation         Plan:  -  Will collaborate w/ CHF clinic regarding recommendations for f/u with primary cardiologist  - Will outreach patient in the next 2-3 weeks for continued medication management support  [name] Darnelle Maffucci, PharmD, [place] Pharmacist [place] [phone]

## 2019-07-31 NOTE — Patient Instructions (Signed)
Visit Information  Goals Addressed            This Visit's Progress     Patient Stated   . COMPLETED: "I can't afford my medications and I'm out" (pt-stated)       Current Barriers:  . Financial concerns - RESOLVED; patient was approved for Medicare Extra Help, and was able to re-initiate her Medicare Part D plan.  Addison Bailey her brand name medications are $8.95 and generics are $3.60  Pharmacist Clinical Goal(s):  [name] Over the next 30 days, patient will work with PharmD to address needs related to medication access  Interventions: . Discussed with patient; she is now filling everything at [facility]. She declines needing any refills at this time. We discussed pill packaging available at [facility], however, she notes that she does not like blister packs and is not interested in this at this time.   Patient Self Care Activities:  . Self administers medications as prescribed . Calls pharmacy for medication refills  Please see past updates related to this goal by clicking on the "Past Updates" button in the selected goal      . "I want to stay out of the hospital" (pt-stated)       Current Barriers:  . Polypharmacy; complex patient with multiple comorbidities including HFrEF, COPD, CAD (hx NSTEMI, s/p CABG x3 and mitral valve repair in 2007), continued severe mitral valve regurgitation; barrett's esophagus, depression . Self-manages medications by filling a BID pill box . Readmitted to [facility] 9/9-9/10/2019 for CHF exacerbation and COPD exacerbation, believed to be related to mitral valve regurg . COPD: Since that time, established with pulmonary, saw Dr. Patsey Berthold, who consolidated inhaler regimen to Trelegy daily; patient confirms adherence o Notes she has not needed albuterol HFA or nebulizer as often since starting Trelegy. . CHF: HFrEF; managed on Entresto 49/51 BID, spironolactone 12.5 mg BID, carvedilol 3.125 mg BID, furosemide 40 mg BID (taking in AM and PM) o Confirms  weighing daily; reports 156-158 lbs. Confirms knowledge to call HF clinic/cardiology with weight gain >3 lbs/day or 5 lbs/week o Notes she does not have f/u with Dr. Saralyn Pilar scheduled, and that the office will contact her when she's due for f/u o Missed CHF appointment on 9/22, notes that her car wouldn't start  . Insomnia: notes taking trazodone 50 mg QPM, alprazolam 0.25 mg QPM, and diphenhydramine 25 mg QPM for sleep.   Pharmacist Clinical Goal(s):  [name] Over the next 90 days, patient will work with PharmD and provider towards optimized medication management  Interventions: . Comprehensive medication review performed; medication list updated in electronic medical record . Reviewed importance of inhaler adherence with patient. Reviewed that she has PFTs and f/u w/ Dr. Patsey Berthold next month. Patient verbalized understanding. . Reviewed daily weights and parameters to seek medical instruction. Patient has not seen Dr. Saralyn Pilar since 11/2018. Will collaborate w/CHF clinic to see when patient should f/u w/ primary cardiologist, especially considering question of mitral valve regurg and affect on frequent rehospitalizations . Counseled patient to take second furosemide dose in the afternoon, instead of evening, due to half life of furosemide and to prevent nocturia interrupting sleep. Patient verbalized understanding.  . Reinforced importance of f/u with HF clinic. Patient verbalized understanding . Moving forward, will further address the risk of multiple CNS sedating medications, especially given patient's baseline respiratory problems.   Patient Self Care Activities:  . Patient will take medications as prescribed . Patient will attend follow up appointments as scheduled.  Initial goal documentation        The patient verbalized understanding of instructions provided today and declined a print copy of patient instruction materials.   Plan:  - Will collaborate w/ CHF clinic regarding  recommendations for f/u with primary cardiologist  - Will outreach patient in the next 2-3 weeks for continued medication management support  [name] Darnelle Maffucci, PharmD, [place] Pharmacist [place] [phone]

## 2019-08-01 ENCOUNTER — Other Ambulatory Visit: Payer: Self-pay | Admitting: *Deleted

## 2019-08-01 ENCOUNTER — Encounter: Payer: Self-pay | Admitting: *Deleted

## 2019-08-01 NOTE — Patient Outreach (Addendum)
[facility]) Care Management [place] Telephone Outreach PCP completes Transition of Care follow up post-hospital discharge Post- (most recent) hospital discharge day # 13  08/01/2019  Ashley [name] 02/11/1951 [id number]  [place] outreach to Ashley [name], 68 y/o femalereferred to [place] by [facility] Liaison RN CM after recent hospitalization July 21-25, 2020 for acute on chronic CHF exacerbation.Patientwas discharged from hospital to home/ self-care without home health services in place. Patient has history including, but not limited to, combined CHF; CAD with previous MI/ CABG, and ICD; HTN/ HLD; GERD; COPD; tobacco use.  Unfortunately, patient experienced (2) recent hospital readmissions: 1) August 13-15, 2020 for acute respiratory failure with hypoxia/ 18 days after her hospital discharge, ; patient was again discharged home without home health services in place. 2) September 9-12, 2020 for acute pulmonary edema; respiratory failure with hypoxia; and CHF exacerbation/ 25 days after previous hospital discharge; was again discharged home without home health services in place, as patient continually refuses need for home health.  HIPAA/ identity verified with patient.  Today, patient reports that she "is doing great with the new inhaler" and she denies pain, new/ recent falls, and concerns around her clinical condition.  Patient states that since she has been using newly prescribed inhaler she "feels 100% better."  Discussed with patient that the new inhaler is a mixture of 2 separate inhalers she was previously on, and she reports that she "knows this" but states "for some reason it just seems to be helping more" that previous prescriptions did.  Patient sounds to be in no distress throughout phone call today, and she reports that she does not have much time to talk, as her fiancee is waiting on an important call from his  doctors.  Patient further reports: -- no concerns around medications; confirmed that she has spoken to Ashley [name], [facility] Pharmacist to review medications post-most recent hospital discharge and she denies ongoing concerns; remains able to accurately verbalize post-hospital discharge changes to medications and endorses taking all as prescribed  -- reviewed all recent and upcoming provider appointments, patient verbalizes plan to attend all and accurate report of her understanding of scheduled provider appointments; reports that she will continue to provide her own transportation yo all provider appointments ---- discussed need to schedule prompt cardiology appointment for follow up on decreased EF/ ECHO done at time of hospital visit, patient states she will do  Self-health management ofchronic disease state of CHF/ COPD:  -- patient hascontinuedestablishedroutineof daily weight monitoring/recording at home, along with appropriate action plan/ weight gain guidelines in setting of CHF; reports no significant changes to daily weight value ranges -- has continued NOT smoking since August 13 and states family members continue going outside to smoke- reviewed stop smoking resources previously provided to her and encouraged her to follow up on these resources -- discussed/ provided education around significance of recent ECHO and EF results: strongly encouraged patient to make follow up appointment with cardiologist to evaluate need for revision of previous MVR (2007); patient reports that 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 -- using teach back method, confirmed that patient is able to verbalize signs/ symptoms yellow zone for CHF/ COPD along with corresponding action plan -- provided positive reinforcement that patient reports she has been  trying to walk more for short periods of time  as allowed around weather conditions; reports she is "doing good with this," but does admit to occasional activity intolerance- encouraged patient to progress with activity slowly and to take frequent rest breaks   Patient denies further issues, concerns, or problems today. Iconfirmed that patient hasmy direct phone number, the main [facility] CM office phone number, and the [facility] CM 24-hour nurse advice phone number should issues arise prior to next scheduled [facility] CM outreachin 2 weeks. Encouraged patient to contact me directly if needs, questions, issues, or concerns arise prior to next scheduled outreach; patient agreed to do so.  Plan:  Patient will take medications as prescribed and will attend all scheduled provider appointments  Patient will promptly notify care providers for any new concerns/ issues/ problems that arise  Patient will continue monitoring/ recording daily weights   Patient will continue her efforts to quit smoking  I will communicate via secure messaging through EMR to patient's PCP and cardiologist to facilitate prompt scheduling of hospital follow up appointment to evaluate possible need for MVR revision  [facility] CM outreach to continue with scheduled phone callwithin 2 weeks  [facility] CM Care Plan Problem One     Most Recent Value  Care Plan Problem One  High risk for hospital readmission related to/ as evidenced by recent hospitalization for CHF exacerbation July 21-25, 2020  Role Documenting the Problem One  Care Management [place] for Problem One  Active  [facility] Long Term Goal   Over the next 31 days, patient will not experience unplanned hospital readmission, as evidenced by patient reporting and review of EMR during [place] Term Goal Start Date  07/21/19 [Goal extended 9/14 due to hosp readmission]  Interventions for Problem One Long Term Goal  Discussed with patient  her current clinical condition and confirmed that she has no current clinical concerns,  confirmed that she endorses that she has continued not smoking and that her family is not smoking in their home- positive reinfoircement provided  [facility] CM Short Term Goal #1   Member will report takinng medications as instructed over the nexft 4 weeks  [facility] CM Short Term Goal #1 Start Date  07/21/19  Interventions for Short Term Goal #1  Confirmed that patient has obtained and is taking newly prescribed inhaler,  re-reviewed recent medication changes with patient and confirmed that she understands and is taking all as prescribed,  confirmed that patient continues working with [place] team and that she has reviewed most recent post-hospital discharge medications with [facility] Pharmacist since being home from hospital.  Confirmed that patient has no outstanding medication issues at this time  [facility] CM Short Term Goal #2   Member will report keeping and attending follow up appointments within the nexft 2 weeks  [facility] CM Short Term Goal #2 Start Date  08/01/19- goal extended  Interventions for Short Term Goal #2  Reviewed recent and upcoming provider appointments with patient and encouraged her to promptly schedule post-hospital cardiology provider appointment to evaluate possible need for MVR revision    [facility] CM Care Plan Problem Two     Most Recent Value  Care Plan Problem Two  Need for ongoing reinforcement of self-health management strategies for chronic disease state of CHF/ COPD, as evidenced by patient reporting and recent hospitalization for CHF exacerbation  Role Documenting the Problem Two  Care Management Coordinator  Care Plan for Problem Two  Active  Interventions for Problem Two Long Term Goal  Confirmed that patient has continued monitoring and recording daily weights,  discussed her recent missed appointment with the heart failure clinic and confirmed that she has rescheduled this visit.  Encouraged patient to  promptly notify care providers for any new concerns/ issues/ problems with clinical condition and/ or with scheduled appointments  [facility] Long Term Goal  Over the next 14 days, patient will continue to monitor and record daily weights at home, as evidenced by patient reporting and review of same during [place] CM outreach  [place] Term Goal Start Date  08/01/2019- goal extended  [facility] CM Short Term Goal #1   Over the next 14 days, patient will schedule follow up appointment with cardiologist, as evidenced by patient reporting and review of EMR during [facility] RN CM outreach  [facility] CM Short Term Goal #1 Start Date  08/01/19  Interventions for Short Term Goal #2   Discussed with patient findings of ECHO recently performed at hospital,  discussed purpose of ECHO and results of same,  provided education aorund significance of EF value and discussed with patient that her EF has decreased over last 2 years,  discussed need for prompt cardiologist follow up appointment to evaluate need for revision of MVR  [facility] CM Short Term Goal #2   Over the next 27 days, patient will continue not smoking, as evidenced by patient reporting during [facility] RN CM outreach  [facility] CM Short Term Goal #2 Start Date  07/01/19  [facility] CM Short Term Goal #2 Met Date  08/01/19 [Goal Met]  Interventions for Short Term Goal #2  Confirmed that patient continues to endorse not smoking,  prositive reinforcement provided,  reinforced with patient quit smoking resources and encouraged her to use these resources     [name], RN, BSN, [place] Care Management  [phone]

## 2019-08-01 NOTE — Patient Outreach (Signed)
[name] Care Management [facility] CM Multidisciplinary Team Discussion 08/01/2019  [name] 1951/08/03 [id number]  Updates in red text  [facility] CM Multidisciplinary Case Discussion re:  Ashley [name], 68 y/o femalereferred to [place] by [facility] Liaison RN CM after recent hospitalization July 21-25, 2020 for acute on chronic CHF exacerbation.Patientwas discharged from hospital to home/ self-care without home health services in place. Patient has history including, but not limited to, combined CHF; CAD with previous MI/ CABG, and ICD; HTN/ HLD; GERD; COPD; tobacco use.  Unfortunately, patient experienced hospital readmission 18 days after her hospital discharge, August 13-15, 2020 for acute respiratory failure with hypoxia; patient was again discharged home without home health services in place.  Patient experienced third (most recent) hospital admission 25 days after previous hospital discharge September 9-12, 2020 for acute pulmonary edema; respiratory failure with hypoxia; and CHF exacerbation; had ECHO during this visit and EF was 45%; reduced from 55-60% in 2017 (last documented ECHO/ EF); inpatient medical provider speculated that patient may need revision of previously completed MVR, which was originally completed in 2007 when patient had CABG/ NSTEMI.  Patient was again discharged home without home health services in place  Barriers: From 07/04/2019 case conference:  -- Extensive clinical history: cardiac and respiratory; overall fragile with occasional- intermittent episodes of extreme overnight weight gain > 3 lbs -- Ongoing smoking-- patient reports has recently quit over last 2 weeks after last hospital discharge: patient endorses that she quit smoking on June 19, 2019 at time of her hospital admission -- Need for [place] involved/ addressing: medications now optimized; [name] continues  following for ongoing management of patient medication needs -- questionable issues around patent's perception of health status; may not be based in reality; patient not proactive in addressing acute issues; has not followed up on requests for follow up needed by her to address her medication concerns; ongoing uncertainty around patient's perception/ motivation of self- management/ control her clinical condition  -- good family support overall; family members have now taken their own smoking outside to help patient remain smoke free -- patient reported stress around her fiancee's health/ recent hospitalizations, although she is noted to be on multiple medications for stress/ anxiety control -- updated ECHO performed during most recent hospital admission, with EF noted 45% (decreased from 55-60% in 2017)- inpatient medical team suspects possibility that patient's MVR from 2007 may need to be surgically revised-- appears patient has not had any recent visits with cardiologist; question need for appointment to address need for MVR revision   -- patient continually refuses home health services with each hospital admission -- patient noted to have increased utilization/ hospital admissions since May 2020-- has had a total of 4 hospital admissions in 4 months; previously had only one admission in 2019 -- patient missed scheduled CHF clinic follow up visit scheduled for 07/29/2019: stated car trouble, but continues to deny transportation needs   [facility] CM Team Discussion/ plan: From 07/04/2019 case conference:  -- continue efforts to encourage patient to remain smoke free: collaborate with providers/ new pulmonary referral (pending): patient attended pulmonary referral provider office visit on 07/24/2019; pulmonary provider agreed that patient's recent issues with respiratory status may likely be related more to need for revision of MVR; PFT's pending- appointment  -- engage patient's fiancee/ family in efforts to  partner with patient and also quit smoking-- follow up patient/ family on efforts -- explore nutritional habits/ practices more thoroughly with  ongoing outreaches -- engage patient's PCP in efforts to discern reason for multiple recent hospitalizations -- encourage/ facilitate as indicated patient to promptly schedule hospital discharge cardiology appointment for evaluation of need for MVR revision -- continue to follow closely;  resubmit to [facility] case discussion in 3 weeks for update, unless indicated sooner  Ashley Rack, RN, BSN, [employer] Coordinator [facility] Care Management  [phone]

## 2019-08-02 ENCOUNTER — Emergency Department
Admission: EM | Admit: 2019-08-02 | Discharge: 2019-08-03 | Disposition: A | Discharge status: Home / Self Care | Payer: Medicare Other | Attending: Emergency Medicine | Admitting: Emergency Medicine

## 2019-08-02 ENCOUNTER — Other Ambulatory Visit: Payer: Self-pay

## 2019-08-02 ENCOUNTER — Encounter: Payer: Self-pay | Admitting: Emergency Medicine

## 2019-08-02 ENCOUNTER — Emergency Department: Payer: Medicare Other

## 2019-08-02 DIAGNOSIS — E876 Hypokalemia: Secondary | ICD-10-CM | POA: Insufficient documentation

## 2019-08-02 DIAGNOSIS — R748 Abnormal levels of other serum enzymes: Secondary | ICD-10-CM | POA: Insufficient documentation

## 2019-08-02 DIAGNOSIS — Z85038 Personal history of other malignant neoplasm of large intestine: Secondary | ICD-10-CM | POA: Diagnosis not present

## 2019-08-02 DIAGNOSIS — R079 Chest pain, unspecified: Secondary | ICD-10-CM | POA: Insufficient documentation

## 2019-08-02 DIAGNOSIS — F10929 Alcohol use, unspecified with intoxication, unspecified: Secondary | ICD-10-CM | POA: Diagnosis not present

## 2019-08-02 DIAGNOSIS — Z79899 Other long term (current) drug therapy: Secondary | ICD-10-CM | POA: Insufficient documentation

## 2019-08-02 DIAGNOSIS — N189 Chronic kidney disease, unspecified: Secondary | ICD-10-CM | POA: Insufficient documentation

## 2019-08-02 DIAGNOSIS — Z7901 Long term (current) use of anticoagulants: Secondary | ICD-10-CM | POA: Insufficient documentation

## 2019-08-02 DIAGNOSIS — Z7982 Long term (current) use of aspirin: Secondary | ICD-10-CM | POA: Insufficient documentation

## 2019-08-02 DIAGNOSIS — Z87891 Personal history of nicotine dependence: Secondary | ICD-10-CM | POA: Insufficient documentation

## 2019-08-02 DIAGNOSIS — I2510 Atherosclerotic heart disease of native coronary artery without angina pectoris: Secondary | ICD-10-CM | POA: Diagnosis not present

## 2019-08-02 DIAGNOSIS — Z950 Presence of cardiac pacemaker: Secondary | ICD-10-CM | POA: Insufficient documentation

## 2019-08-02 DIAGNOSIS — I130 Hypertensive heart and chronic kidney disease with heart failure and stage 1 through stage 4 chronic kidney disease, or unspecified chronic kidney disease: Secondary | ICD-10-CM | POA: Diagnosis not present

## 2019-08-02 DIAGNOSIS — I509 Heart failure, unspecified: Secondary | ICD-10-CM | POA: Diagnosis not present

## 2019-08-02 LAB — HEPATIC FUNCTION PANEL
ALT: 13 U/L (ref 0–44)
AST: 14 U/L — ABNORMAL LOW (ref 15–41)
Albumin: 3.6 g/dL (ref 3.5–5.0)
Alkaline Phosphatase: 73 U/L (ref 38–126)
Bilirubin, Direct: 0.1 mg/dL (ref 0.0–0.2)
Indirect Bilirubin: 0.7 mg/dL (ref 0.3–0.9)
Total Bilirubin: 0.8 mg/dL (ref 0.3–1.2)
Total Protein: 6.5 g/dL (ref 6.5–8.1)

## 2019-08-02 LAB — CBC
HCT: 34.3 % — ABNORMAL LOW (ref 36.0–46.0)
Hemoglobin: 11.4 g/dL — ABNORMAL LOW (ref 12.0–15.0)
MCH: 31.5 pg (ref 26.0–34.0)
MCHC: 33.2 g/dL (ref 30.0–36.0)
MCV: 94.8 fL (ref 80.0–100.0)
Platelets: 254 10*3/uL (ref 150–400)
RBC: 3.62 MIL/uL — ABNORMAL LOW (ref 3.87–5.11)
RDW: 14.6 % (ref 11.5–15.5)
WBC: 6.3 10*3/uL (ref 4.0–10.5)
nRBC: 0 % (ref 0.0–0.2)

## 2019-08-02 LAB — BASIC METABOLIC PANEL
Anion gap: 11 (ref 5–15)
BUN: 19 mg/dL (ref 8–23)
CO2: 26 mmol/L (ref 22–32)
Calcium: 8.8 mg/dL — ABNORMAL LOW (ref 8.9–10.3)
Chloride: 101 mmol/L (ref 98–111)
Creatinine, Ser: 0.99 mg/dL (ref 0.44–1.00)
GFR calc Af Amer: >60 mL/min (ref >60)
GFR calc non Af Amer: 59 mL/min — ABNORMAL LOW (ref >60)
Glucose, Bld: 116 mg/dL — ABNORMAL HIGH (ref 70–99)
Potassium: 2.8 mmol/L — ABNORMAL LOW (ref 3.5–5.1)
Sodium: 138 mmol/L (ref 135–145)

## 2019-08-02 LAB — TROPONIN I (HIGH SENSITIVITY): Troponin I (High Sensitivity): 13 ng/L (ref <18)

## 2019-08-02 LAB — ETHANOL: Alcohol, Ethyl (B): 149 mg/dL — ABNORMAL HIGH (ref <10)

## 2019-08-02 LAB — LIPASE, BLOOD: Lipase: 65 U/L — ABNORMAL HIGH (ref 11–51)

## 2019-08-02 MED ORDER — SODIUM CHLORIDE 0.9 % IV BOLUS
500.0000 mL | Freq: Once | INTRAVENOUS | Status: AC
  Administered 2019-08-02: 500 mL via INTRAVENOUS

## 2019-08-02 MED ORDER — POTASSIUM CHLORIDE CRYS ER 20 MEQ PO TBCR
40.0000 meq | EXTENDED_RELEASE_TABLET | Freq: Once | ORAL | Status: AC
  Administered 2019-08-02: 40 meq via ORAL
  Filled 2019-08-02: qty 2

## 2019-08-02 MED ORDER — ASPIRIN 81 MG PO CHEW
324.0000 mg | CHEWABLE_TABLET | Freq: Once | ORAL | Status: AC
  Administered 2019-08-02: 324 mg via ORAL
  Filled 2019-08-02: qty 4

## 2019-08-02 NOTE — ED Triage Notes (Signed)
Pt called ems for chest wall pain x 1 hour. Per pt she has mitral valve problems. Pt has been drinking tonight as well.

## 2019-08-02 NOTE — ED Provider Notes (Signed)
[facility] Emergency Department Provider Note  ____________________________________________   First MD Initiated Contact with Patient 08/02/19 2301     (approximate)  I have reviewed the triage vital signs and the nursing notes.   HISTORY  Chief Complaint Chest Pain  Level 5 caveat:  history/ROS may be limited by acute intoxication  HPI Ashley [name] is a 68 y.o. female with medical history as listed below which notably includes a history of AICD placement, CHF, mitral valve disorder status post repair , CABG with history of MI, and who is currently being worked up to determine the level her level of COPD.  She presents tonight by EMS for chest wall pain that occurred abruptly in onset about an hour prior to arrival.  She says it is sharp and centered in the middle of her chest.  Nothing in particular makes it better or worse.  She denies shortness of breath, cough, fever/chills, nausea, vomiting, sweating, abdominal pain, and dysuria.  She says she has only had one drink tonight.  She was sleeping comfortably when I checked on her in the room and she says that she feels better but still feels a little bit of discomfort.  No trauma of which she is aware.  She has a history of hypertension and hyperlipidemia and she is a former smoker but it is unclear if she still occasionally uses tobacco.        Past Medical History:  Diagnosis Date  . AICD (automatic cardioverter/defibrillator) present    on right side  . Anemia   . CAD (coronary artery disease)    s/p CABG  . CHF (congestive heart failure) ([place])   . Chronic kidney disease    renal artery stenosis  . Colon cancer ([place])   . Depression   . GERD (gastroesophageal reflux disease)   . Hx of colonic polyps   . Hyperlipidemia   . Hypertension   . Mitral valve disorder    s/p mitral valve repair wth CABG  . Myocardial infarction ([place])   . Peripheral vascular disease ([place])   . Presence of permanent  cardiac pacemaker    Pacemaker/ Defibrillator    Patient Active Problem List   Diagnosis Date Noted  . CHF (congestive heart failure) ([place]) 07/16/2019  . Acute respiratory failure with hypoxia ([place]) 06/19/2019  . Cough with hemoptysis 06/09/2019  . Acute on chronic combined systolic and diastolic CHF (congestive heart failure) ([place]) 05/27/2019  . Insomnia 05/07/2019  . Hypokalemia 03/23/2019  . Hyperglycemia, drug-induced 03/23/2019  . Covid-19 Virus not Detected 03/23/2019  . Respiratory failure ([place]) 03/16/2019  . AVM (arteriovenous malformation) of small bowel, acquired   . Anemia, iron deficiency 11/10/2018  . Chronic systolic heart failure ([place]) 11/06/2018  . Rectal polyp   . Benign neoplasm of cecum   . Barrett's esophagus without dysplasia   . Stomach irritation   . Chronic diastolic heart failure ([place]) 07/03/2018  . HTN (hypertension) 07/03/2018  . 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 B12 deficiency anemia 06/28/2018  . Hypotension 05/11/2018  . Prediabetes 05/11/2018  . Personal history of colon cancer   . Benign neoplasm of descending colon   . Polyp of sigmoid colon   . Benign neoplasm of transverse colon   . Diverticulosis of large intestine without diverticulitis   . 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 Renal artery stenosis ([place]) 10/03/2016  . 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 Hospital discharge follow-up 02/09/2015  . Cough in  adult 10/21/2014  . Tobacco abuse 10/11/2014  . Tobacco abuse counseling 10/11/2014  . Chronic right hip pain 08/26/2014  . Atherosclerosis of native artery of extremity with intermittent claudication ([place]) 06/18/2013  . Preoperative evaluation to rule out surgical contraindication 06/18/2013  . CAD (coronary artery disease) 06/01/2013  . GERD (gastroesophageal reflux disease) 06/01/2013  . Hypercholesterolemia 06/01/2013  . Tubular adenoma of colon 06/01/2013  . 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 arm surgery     fracture, has plates and screws  . CABG with mitral valve repair    . CARDIAC CATHETERIZATION    . COLON SURGERY     colon cancer  . COLONOSCOPY WITH PROPOFOL N/A 10/09/2016   Procedure: COLONOSCOPY WITH PROPOFOL;  Surgeon: Jonathon Bellows, MD;  Location: [facility] ENDOSCOPY;  Service: Endoscopy;  Laterality: N/A;  . COLONOSCOPY WITH PROPOFOL N/A 07/24/2018   Procedure: COLONOSCOPY WITH PROPOFOL;  Surgeon: Virgel Manifold, MD;  Location: [facility] ENDOSCOPY;  Service: Endoscopy;  Laterality: N/A;  . CORONARY ARTERY BYPASS GRAFT    . ENTEROSCOPY N/A 11/21/2018   Procedure: ENTEROSCOPY-BALLOON;  Surgeon: Jonathon Bellows, MD;  Location[facility] ENDOSCOPY;  Service: Gastroenterology;  Laterality: N/A;  . ESOPHAGOGASTRODUODENOSCOPY (EGD) WITH PROPOFOL N/A 07/24/2018   Procedure: ESOPHAGOGASTRODUODENOSCOPY (EGD) WITH PROPOFOL;  Surgeon: Virgel Manifold, MD;  Location: [facility] ENDOSCOPY;  Service: Endoscopy;  Laterality: N/A;  . GIVENS CAPSULE STUDY N/A 08/16/2018   Procedure: GIVENS CAPSULE STUDY;  Surgeon: Virgel Manifold, MD;  Location: [facility] ENDOSCOPY;  Service: Endoscopy;  Laterality: N/A;  . pace maker defib  2016  . PERIPHERAL VASCULAR CATHETERIZATION N/A 10/23/2016   Procedure: Renal Angiography;  Surgeon: Algernon Huxley, MD;  Location: [facility];  Service: Cardiovascular;  Laterality: N/A;  . TOTAL ABDOMINAL HYSTERECTOMY  1999   history of abnormal pap    Prior to Admission medications   Medication Sig Start Date End Date Taking? Authorizing Provider  acetaminophen (TYLENOL) 500 MG tablet Maximum 6 tablets a day. Patient taking differently: Take 500 mg by mouth every 4 (four) hours as needed for fever. Maximum 6 tablets a day. 02/09/15   Rubbie Battiest, RN  albuterol (VENTOLIN HFA) 108 (90 Base) MCG/ACT inhaler INHALE 2 PUFFS BY MOUTH INTO THE LUNGS EVERY 6 HOURS AS NEEDED FOR  WHEEZING 06/03/19   [name], MD  ALPRAZolam Duanne Moron) 0.5 MG tablet Take 0.5-1 tablets (0.25-0.5 mg total) by mouth at bedtime. TAKE ONE TABLET BY MOUTH AT BEDTIME AS NEEDED FOR ANXIETY 02/24/19   [name], MD  aspirin 81 MG tablet Take 81 mg by mouth daily.    [provider]  atorvastatin (LIPITOR) 40 MG tablet TAKE ONE TABLET BY MOUTH ONCE DAILY 07/25/19   [name], MD  carvedilol (COREG) 3.125 MG tablet Take 1 tablet (3.125 mg total) by mouth 2 (two) times daily with a meal. 07/19/19   Loletha Grayer, MD  clopidogrel (PLAVIX) 75 MG tablet Take 1 tablet (75 mg total) by mouth daily. 06/03/19   [name], MD  cyanocobalamin (,VITAMIN B-12,) 1000 MCG/ML injection Inject 1 mL weekly into the muscle monthly 11/08/18   [name], MD  diphenhydrAMINE (DIPHENHIST) 25 mg capsule Take 25 mg by mouth at bedtime as needed.     [provider]  escitalopram (LEXAPRO) 10 MG tablet Take 1 tablet (10 mg total) by mouth daily. 06/03/19   [name], MD  ferrous sulfate 325 (65 FE) MG EC tablet Take 325 mg by mouth daily with breakfast.    [provider]  Fluticasone-Umeclidin-Vilant (TRELEGY ELLIPTA) 100-62.5-25 MCG/INH AEPB Inhale 1 puff into the lungs daily.    [provider]  furosemide (LASIX) 40 MG tablet One tablet twice a day 07/19/19   Loletha Grayer, MD  ipratropium-albuterol (DUONEB) 0.5-2.5 (3) MG/3ML SOLN Take 3 mLs by nebulization every 6 (six) hours as needed. 08/05/18   [name], MD  nitroGLYCERIN (NITROSTAT) 0.4 MG SL tablet Place 1 tablet (0.4 mg total) under the tongue every 5 (five) minutes as needed for chest pain. 06/19/16   [name], MD  omeprazole (PRILOSEC) 40 MG capsule Take 1 capsule (40 mg total) by mouth daily. 06/03/19   [name], MD  potassium chloride (K-DUR) 10 MEQ tablet Take 1 tablet (10 mEq total) by mouth daily. 06/03/19   [name], MD  potassium chloride SA (KLOR-CON M20) 20 MEQ  tablet Take 1 tablet (20 mEq total) by mouth daily. 08/03/19   Hinda Kehr, MD  sacubitril-valsartan (ENTRESTO) 24-26 MG Take 1 tablet by mouth 2 (two) times daily. 07/19/19   Loletha Grayer, MD  spironolactone (ALDACTONE) 25 MG tablet Take 0.5 tablets (12.5 mg total) by mouth daily. 07/28/19 07/27/20  [name], MD  traZODone (DESYREL) 50 MG tablet Take 0.5-1 tablets (25-50 mg total) by mouth daily. One hour before bedtime 06/03/19   [name], MD    Allergies Hydrocodone-acetaminophen  Family History  Problem Relation Age of Onset  . Arthritis Mother   . Cancer Mother        uterus cancer  . Hyperlipidemia Mother   . Hypertension Mother   . Heart disease Mother   . Diabetes Mother   . Hyperlipidemia Father   . Hypertension Father   . Heart disease Father   . Diabetes Father   . Cancer Sister        ovary cancer  . Diabetes Maternal Grandmother   . Hypertension Maternal Grandmother   . Arthritis Maternal Grandmother   . Hypertension Maternal Grandfather   . Hypertension Paternal Grandmother   . Hypertension Paternal Grandfather   . Heart disease Paternal Grandfather   . Heart disease Brother   . Kidney disease Brother   . Breast cancer Neg Hx     Social History Social History   Tobacco Use  . Smoking status: Former Smoker    Packs/day: 0.25    Types: Cigarettes    Quit date: 06/19/2019    Years since quitting: 0.1  . Smokeless tobacco: Never Used  . Tobacco comment: reports smoking 2-3 cigarettes/ day  Substance Use Topics  . Alcohol use: Yes    Alcohol/week: 2.0 standard drinks    Types: 2 Shots of liquor per week    Comment: occasion  . Drug use: No    Review of Systems Level 5 caveat:  history/ROS may be limited by acute intoxication  Constitutional: No fever/chills Eyes: No visual changes. ENT: No sore throat. Cardiovascular: +chest pain. Respiratory: Denies shortness of breath. Gastrointestinal: No abdominal pain.  No nausea, no vomiting.   No diarrhea.  No constipation. Genitourinary: Negative for dysuria. Musculoskeletal: Negative for neck pain.  Negative for back pain. Integumentary: Negative for rash. Neurological: Negative for headaches, focal weakness or numbness.   ____________________________________________   PHYSICAL EXAM:  VITAL SIGNS: ED Triage Vitals  Enc Vitals Group     BP 08/02/19 2235 113/67  Pulse Rate 08/02/19 2238 61     Resp 08/02/19 2238 16     Temp 08/02/19 2238 97.8 F (36.6 C)     Temp src --      SpO2 08/02/19 2238 98 %     Weight 08/02/19 2234 70.8 kg (156 lb)     Height 08/02/19 2234 1.626 m (5\' 4" )     Head Circumference --      Peak Flow --      Pain Score 08/02/19 2233 2     Pain Loc --      Pain Edu? --      Excl. in [place]? --     Constitutional: Alert and oriented.  Sleeping comfortably when I checked on her.  She appears comfortable and not in distress when awakened. Eyes: Conjunctivae are normal.  Head: Atraumatic. Nose: No congestion/rhinnorhea. Mouth/Throat: Mucous membranes are moist. Neck: No stridor.  No meningeal signs.   Cardiovascular: Normal rate, regular rhythm. Good peripheral circulation. Grossly normal heart sounds. Respiratory: Normal respiratory effort.  No retractions. Gastrointestinal: Soft and nontender. No distention.  Musculoskeletal: No lower extremity tenderness nor edema. No gross deformities of extremities. Neurologic: Mildly slurred speech and language. No gross focal neurologic deficits are appreciated.  Skin:  Skin is warm, dry and intact. Psychiatric: Mood and affect are normal. Speech and behavior are normal.  ____________________________________________   LABS (all labs ordered are listed, but only abnormal results are displayed)  Labs Reviewed  BASIC METABOLIC PANEL - Abnormal; Notable for the following components:      Result Value   Potassium 2.8 (*)    Glucose, Bld 116 (*)    Calcium 8.8 (*)    GFR calc non Af Amer 59 (*)     All other components within normal limits  CBC - Abnormal; Notable for the following components:   RBC 3.62 (*)    Hemoglobin 11.4 (*)    HCT 34.3 (*)    All other components within normal limits  ETHANOL - Abnormal; Notable for the following components:   Alcohol, Ethyl (B) 149 (*)    All other components within normal limits  HEPATIC FUNCTION PANEL - Abnormal; Notable for the following components:   AST 14 (*)    All other components within normal limits  LIPASE, BLOOD - Abnormal; Notable for the following components:   Lipase 65 (*)    All other components within normal limits  MAGNESIUM  TROPONIN I (HIGH SENSITIVITY)  TROPONIN I (HIGH SENSITIVITY)   ____________________________________________  EKG  ED ECG REPORT I, Hinda Kehr, the attending physician, personally viewed and interpreted this ECG.  Date: 08/02/2019 EKG Time: 22: 40 Rate: 64 Rhythm: normal sinus rhythm QRS Axis: normal Intervals: normal ST/T Wave abnormalities: Non-speific ST segment / T-wave changes, but no clear evidence of acute ischemia.  It in particular T waves are inverted in the lateral leads and have almost a notched appearance. Narrative Interpretation: no definitive evidence of acute ischemia; does not meet STEMI criteria.  EKG is actually considerably better in appearance than prior EKGs which typically demonstrate multifocal atrial tachycardia.   ____________________________________________  RADIOLOGY I, Hinda Kehr, personally viewed and evaluated these images (plain radiographs) as part of my medical decision making, as well as reviewing the written report by the radiologist.  ED MD interpretation:  No acute abnormalities  Official radiology report(s): Dg Chest 2 View  Result Date: 08/02/2019 CLINICAL DATA:  Chest pain EXAM: CHEST - 2 VIEW COMPARISON:  07/16/2019 FINDINGS: Surgical  plate and fixating screws in the left clavicle. Post sternotomy changes. Right-sided pacing device with  multiple leads. Mild cardiomegaly. Coarse interstitial opacity, likely chronic change. No acute consolidation or pleural effusion. IMPRESSION: No active cardiopulmonary disease.  Mild cardiomegaly. Electronically Signed   By: Donavan Foil M.D.   On: 08/02/2019 23:33    ____________________________________________   PROCEDURES   Procedure(s) performed (including Critical Care):  Procedures   ____________________________________________   INITIAL IMPRESSION / MDM / [place] / ED COURSE  As part of my medical decision making, I reviewed the following data within the [place] notes reviewed and incorporated, Labs reviewed , EKG interpreted , Old chart reviewed, Radiograph reviewed , Notes from prior ED visits and [place] Controlled Substance Database   Differential diagnosis includes, but is not limited to, musculoskeletal chest wall pain, ACS, COPD, less likely sepsis.  She is not having any respiratory distress in spite of her chronic pulmonary issues.  She does have an extensive cardiac history with a pacemaker.  She also has been drinking tonight.  EKG is nonspecific but actually appears better than the last 3 most recent EKGs in the system.  She is not in distress at this time and is not tachycardic.  She has a little bit hypotensive and although some of it may be due to her body habitus and position, I am going to give her a 500 mL normal saline bolus.    No acute abnormality identified on chest x-ray.  Ethanol is 149.  Basic metabolic panel is generally reassuring except for potassium of 2.8 will replete with 40 mEq by mouth.  Lipase, hepatic function panel are both pending.  High-sensitivity troponin is 13 and will need to be repeated in 2 hours.  CBC is normal.  We will continue to monitor as she is mildly clinically intoxicated and has sufficient comorbidities that we need to continue monitoring for serial enzyme and additional observation, but at  this point it is not clear that she needs to be admitted.  She agrees with the plan.  I am also giving a full dose aspirin.         Clinical Course as of Aug 02 645  Sun Aug 03, 2019  0146 BP improved.  Patient ambulated to the toilet without any unsteadiness.  Denies any pain or other smyptoms at this time.   [CF]  0156 Magnesium: 1.7 [CF]  0246 High-sensitivity troponin of 13 is unchanged from prior.  I will discharge with a prescription for potassium supplement for a week and recommend close outpatient follow-up with Dr. Saralyn Pilar.  I gave my usual customary return precautions.  Patient is hemodynamically stable and clinically sober at this time.   [CF]    Clinical Course User Index [CF] Hinda Kehr, MD     ____________________________________________  FINAL CLINICAL IMPRESSION(S) / ED DIAGNOSES  Final diagnoses:  Chest pain, unspecified type  Alcoholic intoxication with complication (HCC)  Hypokalemia  Elevated lipase     MEDICATIONS GIVEN DURING THIS VISIT:  Medications  sodium chloride 0.9 % bolus 500 mL (0 mLs Intravenous Stopped 08/03/19 0046)  potassium chloride SA (K-DUR) CR tablet 40 mEq (40 mEq Oral Given 08/02/19 2350)  aspirin chewable tablet 324 mg (324 mg Oral Given 08/02/19 2348)     ED Discharge Orders         Ordered    potassium chloride SA (KLOR-CON M20) 20 MEQ tablet  Daily     08/03/19 0158          *  Please note:  Ashley [name] was evaluated in Emergency Department on 08/03/2019 for the symptoms described in the history of present illness. She was evaluated in the context of the global COVID-19 pandemic, which necessitated consideration that the patient might be at risk for infection with the SARS-CoV-2 virus that causes COVID-19. Institutional protocols and algorithms that pertain to the evaluation of patients at risk for COVID-19 are in a state of rapid change based on information released by regulatory bodies including the CDC and federal  and state organizations. These policies and algorithms were followed during the patient's care in the ED.  Some ED evaluations and interventions may be delayed as a result of limited staffing during the pandemic.*  Note:  This document was prepared using Dragon voice recognition software and may include unintentional dictation errors.   Hinda Kehr, MD 08/03/19 [phone]

## 2019-08-03 LAB — MAGNESIUM: Magnesium: 1.7 mg/dL (ref 1.7–2.4)

## 2019-08-03 LAB — TROPONIN I (HIGH SENSITIVITY): Troponin I (High Sensitivity): 13 ng/L (ref <18)

## 2019-08-03 MED ORDER — SODIUM CHLORIDE 0.9 % IV BOLUS: 500.0000 mL | Freq: Once | INTRAVENOUS | Status: DC

## 2019-08-03 MED ORDER — POTASSIUM CHLORIDE CRYS ER 20 MEQ PO TBCR: 20.0000 meq | EXTENDED_RELEASE_TABLET | Freq: Every day | ORAL | 0 refills | Status: DC

## 2019-08-03 MED ORDER — POTASSIUM CHLORIDE 10 MEQ/100ML IV SOLN: 10.0000 meq | Freq: Once | INTRAVENOUS | Status: DC

## 2019-08-03 NOTE — Discharge Instructions (Signed)

## 2019-08-03 NOTE — ED Notes (Signed)
Pt sleeping on left side no distress noted

## 2019-08-04 ENCOUNTER — Ambulatory Visit: Payer: Self-pay | Admitting: Pharmacist

## 2019-08-04 DIAGNOSIS — I5043 Acute on chronic combined systolic (congestive) and diastolic (congestive) heart failure: Secondary | ICD-10-CM

## 2019-08-04 NOTE — Patient Instructions (Signed)
Visit Information  Goals Addressed            This Visit's Progress     Patient Stated   . "I want to stay out of the hospital" (pt-stated)       Current Barriers:  . Polypharmacy; complex patient with multiple comorbidities including HFrEF, COPD, CAD (hx NSTEMI, s/p CABG x3 and mitral valve repair in 2007), continued severe mitral valve regurgitation; barrett's esophagus, depression . Self-manages medications by filling a BID pill box . Read[facility] to [facility] 9/9-9/10/2019 for CHF exacerbation and COPD exacerbation, believed to be related to mitral valve regurg . ED visit 08/02/2019 for chest pain; work up negative for any acute concerns, they recommended she schedule f/u with Cardiology ASAP . COPD: Tx Trelegy daily + albuterol HFA PRN  . CHF: HFrEF; managed on Entresto 49/51 BID, spironolactone 12.5 mg BID, carvedilol 3.125 mg BID, furosemide 40 mg BID (taking in AM and PM)  . Insomnia: notes taking trazodone 50 mg QPM, alprazolam 0.25 mg QPM, and diphenhydramine 25 mg QPM for sleep.   Pharmacist Clinical Goal(s):  [name] Over the next 90 days, patient will work with PharmD and provider towards optimized medication management  Interventions: . Contacted patient; denies any acute needs at this time.  . Denies having contacted Dr. Saralyn Pilar' office for a follow up. Notes that she has the number on her discharge paperwork, and will call him. Reiterated that MVR may be contributing to SOB symptoms that are causing multiple readmissions . Attempted to provide motivational interviewing to determine if other underlying barriers to care, but patient denied any needs or concerns. Will continue to collaborate with PCP and [facility] CM team to support this patient.   Patient Self Care Activities:  . Patient will take medications as prescribed . Patient will attend follow up appointments as scheduled.   Please see past updates related to this goal by clicking on the "Past Updates" button in the selected goal          The patient verbalized understanding of instructions provided today and declined a print copy of patient instruction materials.   Plan:  - [facility] CM team outreach scheduled 08/11/2019 - CHF clinic appointment scheduled 08/20/2019.   [name] Darnelle Maffucci, PharmD, [place] Pharmacist [facility] [place] [phone]

## 2019-08-04 NOTE — Chronic Care Management (AMB) (Signed)
Chronic Care Management   Follow Up Note   08/04/2019 Name: Ashley [name] MRN: [id number] DOB: June 27, 1951  Referred by: [name], MD Reason for referral : Chronic Care Management   Ashley [name] is a 68 y.o. year old female who is a primary care patient of [name], [name], MD. The CCM team was consulted for assistance with chronic disease management and care coordination needs.    Contacted patient to f/u on ED visit this past weekend.   Review of patient status, including review of consultants reports, relevant laboratory and other test results, and collaboration with appropriate care team members and the patient's provider was performed as part of comprehensive patient evaluation and provision of chronic care management services.    SDOH (Social Determinants of Health) screening performed today: Stress. See Care Plan for related entries.   Advanced Directives Status: N See Care Plan and Vynca application for related entries.  Outpatient Encounter Medications as of 08/04/2019  Medication Sig Note  . acetaminophen (TYLENOL) 500 MG tablet Maximum 6 tablets a day. (Patient taking differently: Take 500 mg by mouth every 4 (four) hours as needed for fever. Maximum 6 tablets a day.)   . albuterol (VENTOLIN HFA) 108 (90 Base) MCG/ACT inhaler INHALE 2 PUFFS BY MOUTH INTO THE LUNGS EVERY 6 HOURS AS NEEDED FOR WHEEZING 07/03/2019: Using 1-2 times daily currently  . ALPRAZolam (XANAX) 0.5 MG tablet Take 0.5-1 tablets (0.25-0.5 mg total) by mouth at bedtime. TAKE ONE TABLET BY MOUTH AT BEDTIME AS NEEDED FOR ANXIETY   . aspirin 81 MG tablet Take 81 mg by mouth daily.   [name] atorvastatin (LIPITOR) 40 MG tablet TAKE ONE TABLET BY MOUTH ONCE DAILY   . carvedilol (COREG) 3.125 MG tablet Take 1 tablet (3.125 mg total) by mouth 2 (two) times daily with a meal.   . clopidogrel (PLAVIX) 75 MG tablet Take 1 tablet (75 mg total) by mouth daily.   . cyanocobalamin (,VITAMIN B-12,) 1000 MCG/ML  injection Inject 1 mL weekly into the muscle monthly 07/31/2019: Monthly  . diphenhydrAMINE (DIPHENHIST) 25 mg capsule Take 25 mg by mouth at bedtime as needed.    [name] escitalopram (LEXAPRO) 10 MG tablet Take 1 tablet (10 mg total) by mouth daily.   . ferrous sulfate 325 (65 FE) MG EC tablet Take 325 mg by mouth daily with breakfast.   . Fluticasone-Umeclidin-Vilant (TRELEGY ELLIPTA) 100-62.5-25 MCG/INH AEPB Inhale 1 puff into the lungs daily.   . furosemide (LASIX) 40 MG tablet One tablet twice a day   . ipratropium-albuterol (DUONEB) 0.5-2.5 (3) MG/3ML SOLN Take 3 mLs by nebulization every 6 (six) hours as needed.   . nitroGLYCERIN (NITROSTAT) 0.4 MG SL tablet Place 1 tablet (0.4 mg total) under the tongue every 5 (five) minutes as needed for chest pain.   [name] omeprazole (PRILOSEC) 40 MG capsule Take 1 capsule (40 mg total) by mouth daily.   . potassium chloride (K-DUR) 10 MEQ tablet Take 1 tablet (10 mEq total) by mouth daily.   . potassium chloride SA (KLOR-CON M20) 20 MEQ tablet Take 1 tablet (20 mEq total) by mouth daily.   . sacubitril-valsartan (ENTRESTO) 24-26 MG Take 1 tablet by mouth 2 (two) times daily.   [name] spironolactone (ALDACTONE) 25 MG tablet Take 0.5 tablets (12.5 mg total) by mouth daily.   . traZODone (DESYREL) 50 MG tablet Take 0.5-1 tablets (25-50 mg total) by mouth daily. One hour before bedtime    No facility-administered encounter medications on file as  of 08/04/2019.      Goals Addressed            This Visit's Progress     Patient Stated   . "I want to stay out of the hospital" (pt-stated)       Current Barriers:  . Polypharmacy; complex patient with multiple comorbidities including HFrEF, COPD, CAD (hx NSTEMI, s/p CABG x3 and mitral valve repair in 2007), continued severe mitral valve regurgitation; barrett's esophagus, depression . Self-manages medications by filling a BID pill box . [facility] to [facility] for CHF exacerbation and COPD exacerbation,  believed to be related to mitral valve regurg . ED visit 08/02/2019 for chest pain; work up negative for any acute concerns, they recommended she schedule f/u with Cardiology ASAP . COPD: Tx Trelegy daily + albuterol HFA PRN  . CHF: HFrEF; managed on Entresto 49/51 BID, spironolactone 12.5 mg BID, carvedilol 3.125 mg BID, furosemide 40 mg BID (taking in AM and PM)  . Insomnia: notes taking trazodone 50 mg QPM, alprazolam 0.25 mg QPM, and diphenhydramine 25 mg QPM for sleep.   Pharmacist Clinical Goal(s):  [name] Over the next 90 days, patient will work with PharmD and provider towards optimized medication management  Interventions: . Contacted patient; denies any acute needs at this time.  . Denies having contacted Dr. Saralyn Pilar' office for a follow up. Notes that she has the number on her discharge paperwork, and will call him. Reiterated that MVR may be contributing to SOB symptoms that are causing multiple readmissions . Attempted to provide motivational interviewing to determine if other underlying barriers to care, but patient denied any needs or concerns. Will continue to collaborate with PCP and [facility] CM team to support this patient.   Patient Self Care Activities:  . Patient will take medications as prescribed . Patient will attend follow up appointments as scheduled.   Please see past updates related to this goal by clicking on the "Past Updates" button in the selected goal          Plan:  - [facility] CM team outreach scheduled 08/11/2019 - CHF clinic appointment scheduled 08/20/2019.   [name] Darnelle Maffucci, PharmD, [place] Pharmacist [facility] [place] [phone]

## 2019-08-11 ENCOUNTER — Other Ambulatory Visit: Payer: Self-pay

## 2019-08-11 ENCOUNTER — Encounter: Payer: Self-pay | Admitting: *Deleted

## 2019-08-11 ENCOUNTER — Other Ambulatory Visit: Payer: Self-pay | Admitting: *Deleted

## 2019-08-11 ENCOUNTER — Telehealth: Payer: Self-pay | Admitting: Pulmonary Disease

## 2019-08-11 NOTE — Patient Outreach (Addendum)
[place] [facility]) [place] Telephone Outreach PCP completes Transition of Care follow up post-hospital discharge Post-(most recent) hospital discharge day # 23 08/11/2019  Ashley [name] 06/27/1951 [id number]  [place] outreach to Ashley [name], 68 y/o femalereferred to [place] by [facility] Liaison RN CM after recent hospitalization July 21-25, 2020 for acute on chronic CHF exacerbation.Patientwas discharged from hospital to home/ self-care without home health services in place. Patient has history including, but not limited to, combined CHF; CAD with previous MI/ CABG, and ICD; HTN/ HLD; GERD; COPD; tobacco use.  Unfortunately, patient experienced(2) recenthospital readmissions: 1)August 13-15, 2020 for acute respiratory failure with hypoxia/18 days after her hospital discharge, ; patient was again discharged home without home health services in place. 2) September 9-12, 2020 for acute pulmonary edema; respiratory failure with hypoxia; and CHF exacerbation/ 25 days after previous hospital discharge; was again discharged home without home health services in place, as patient continually refuses need for home health. 3) ED visit 08/02/2019 for chest pain; patient was also diagnosed with ETOH intoxication and discharged home after being ruled out for ACS/ MI  HIPAA/ identity verified with patient.  Today, patient reports that she "isstill doing great; and she denies pain, new/ recent falls, and concerns around her clinical condition.  Patient again states that she "feels 100% better" with "new" inhaler.  Patient sounds to be in no distress throughout phone call today  Patient further reports: -- no concerns around medications; confirmed that she has not needed to use her rescue inhaler nor her nebulizer recently; confirmed that she continues taking increased dose of diuretic; encouraged patient to promptly notify care  team for any new development of medication concerns; today, she reports no recent changes since her last hospital discharge  -- reviewed all recent and upcoming provider appointments, patient confirms that she has not yet  scheduled prompt cardiology appointment for follow up on decreased EF/ ECHO done at time of last hospital admission, patient states she was waiting until her other appointments were completed, and then planned to make cardiology appointment; I again encouraged patient to go ahead and schedule this appointment TODAY, as it may be difficult to get a PROMPT appointment if she waits.  Again explained that it may take several weeks before an appointment can be made, so the sooner she makes this appointment, the sooner the appointment can occur.  Patient declines assistance in scheduling this appointment, assuring me that she will do so "as soon as we hang up."  I again reviewed the last hospital discharge notes, the recent pulmonary provider notes with patient regarding the possibility that her recent hospital admissions may be related to the need for revision of her previously completed MVR, based on her most recent ECHOcardiogram.  Patient verbalizes understanding.  Patient denies transportation issues, stating that her car trouble has "been worked out."  Patient states she will drive herself to all provider appointments.  Self-health management ofchronic disease state of CHF/ COPD:  -- patient hascontinuedestablishedroutineof daily weight monitoring/recording at home, along with appropriate action plan/ weight gain guidelines in setting of CHF; reports no significant changes to daily weight value ranges; reports ranges between 165-168 lbs," with a weight today of "166 lbs" -- has continued NOT smoking since August 13 and states family members continue going outside to smoke- positive reinforcement provided -- reiterated previously provided education around significance of recent ECHO  and EF results: as noted above, strongly encouraged patient to make follow up  appointment with cardiologist to evaluate need for revision of previous MVR (2007) -- questioned patient around her recent ED visit, where it was incidentally determined that she had ETOH intoxication; when questioned today, patient denies regular use of ETOH, states she "drinks socially;" patient declines to quantify.  Discussed with patient that ETOH can impair the function of the heart and she denies that this is an issue.  Noted SDOH previously completed; patient declines further conversation around this today, stating she "only drinks occasionally" -- using teach back method, confirmed that patient is able to verbalize many signs/ symptoms yellow zone for CHF/ COPD along with corresponding action plan-- reiterated additional signs using teach back method -- patient continues to report that she has been walking outside more and more, and has been tolerating well. Again encouraged patient to progress with activity slowly and to take frequent rest breaks   Patient denies further issues, concerns, or problems today. Iconfirmed that patient hasmy direct phone number, the main [facility] CM office phone number, and the [facility] CM 24-hour nurse advice phone number should issues arise prior to next scheduled [facility] CM outreachin 3 weeks, according to patient preference to have next call after scheduled provider appointments. Encouraged patient to contact me directly if needs, questions, issues, or concerns arise prior to next scheduled outreach; as well as to contact her care providers PROMPTLY should new concerns arise, and patient agreed to do so.  Plan:  Patient will take medications as prescribed and will attend all scheduled provider appointments  Patient will promptly notify care providers for any new concerns/ issues/ problems that arise  Patient will continue monitoring/ recording daily weights   Patient will  continue her efforts to quit smoking  Patient will promptly schedule a cardiology provider appointment  [place] outreach to continue with scheduled phone call in 3 weeks, post-upcoming scheduled provider appointments  [facility] CM Care Plan Problem One     Most Recent Value  Care Plan Problem One  High risk for hospital readmission related to/ as evidenced by recent hospitalization for CHF exacerbation July 21-25, 2020  Role Documenting the Problem One  Care Management [place] for Problem One  Active  [facility] Long Term Goal   Over the next 21 days, patient will not experience unplanned hospital readmission, as evidenced by patient reporting and review of EMR during [place] outreach [Goal extended 21 days to cover upcoming appts]  [place] Term Goal Start Date  08/11/19 [Goal extended 10/05 due to recent ED visit]  Interventions for Problem One Long Term Goal  Discussed current clinical condition with patient and confirmed that she does not have current clinical concerns,  reviewed recent ED visit with patient,  discussed plan of action for development of new concerns/ problems,  confirmed that patient is able to verbalize 2 signs/ symptoms yellow CHF/ COPD zones and using teach back method, reiterated all signs/ symptoms along with corresponding action plan.  Reviewed recent weights obtained at home and confirmed that patient is able to independently verbalize plan of action for weight gain guidelines in CHF  [facility] CM Short Term Goal #1   Member will report takinng medications as instructed over the nexft 4 weeks  [facility] CM Short Term Goal #1 Start Date  07/21/19  [facility] CM Short Term Goal #1 Met Date  08/11/19 [Goal Met]  Interventions for Short Term Goal #1  Reviewed with patient medication use and confirmed that she has and is taking all  prescribed medications,  reviewed use of nebulizer and rescue inhaler,  confirmed that patient is able to verbalize accurate report of dosing and  scheduling of recently increased diuretic  [facility] CM Short Term Goal #2   Member will report keeping and attending follow up appointments within the nexft 3 weeks  [facility] CM Short Term Goal #2 Start Date  08/11/19 [Goal extended]  Interventions for Short Term Goal #2  Reviewed upcoming and recent provider appointments,  confirmed that patient no longer has transportation issues/ concerns and plans to drive self to all appointments,  again discussed the need with patient to promptly schedule cardiology office visit for evaluation of mitral valve,  encouraged patient to promptly make this appointment    [facility] CM Care Plan Problem Two     Most Recent Value  Care Plan Problem Two  Need for ongoing reinforcement of self-health management strategies for chronic disease state of CHF/ COPD, as evidenced by patient reporting and recent hospitalization for CHF exacerbation  Role Documenting the Problem Two  Care Management Coordinator  Care Plan for Problem Two  Active  Interventions for Problem Two Long Term Goal   Confirmed that patient continues monitoring and recording daily weights at home and reviewed recently recorded weights with patient,  confirmed that patient is able to verbalize weight gain guidelines ins setting of CHF along with corresponding action plan  [facility] Long Term Goal  Over the next 14 days, patient will continue to monitor and record daily weights at home, as evidenced by patient reporting and review of same during [place] CM outreach [goal extended]  [facility] Long Term Goal Start Date  08/01/19  [facility] Long Term Goal Met Date  08/11/19 [Goal Met]  [facility] CM Short Term Goal #1   Over the next 21 days, patient will schedule/ attend follow up appointment with cardiologist, as evidenced by patient reporting and review of EMR during [facility] RN CM outreach  [facility] CM Short Term Goal #1 Start Date  08/11/19 [Goal extended]  Interventions for Short Term Goal #2   Confirmed that patient did not schedule cardiology  provider appointment as discussed with last [facility] RN CM,  goal extended- encouraged patient to schedule this appointment promptly       [name], RN, BSN, [place] Coordinator [facility] Care Management  [phone]

## 2019-08-11 NOTE — Telephone Encounter (Signed)
Left message to relay date/time of covid test.  08/13/2019 prior to 11:[facility]

## 2019-08-11 NOTE — Telephone Encounter (Signed)
Pt is aware of date/time of covid test.   

## 2019-08-12 ENCOUNTER — Encounter: Payer: Self-pay | Admitting: Emergency Medicine

## 2019-08-12 ENCOUNTER — Inpatient Hospital Stay
Admission: EM | Admit: 2019-08-12 | Discharge: 2019-08-15 | DRG: 291 | Disposition: A | Discharge status: Home / Self Care | Payer: Medicare Other | Attending: Specialist | Admitting: Specialist

## 2019-08-12 ENCOUNTER — Other Ambulatory Visit: Payer: Self-pay

## 2019-08-12 ENCOUNTER — Other Ambulatory Visit: Payer: Self-pay | Admitting: *Deleted

## 2019-08-12 ENCOUNTER — Emergency Department: Payer: Medicare Other

## 2019-08-12 DIAGNOSIS — E785 Hyperlipidemia, unspecified: Secondary | ICD-10-CM | POA: Diagnosis present

## 2019-08-12 DIAGNOSIS — E1151 Type 2 diabetes mellitus with diabetic peripheral angiopathy without gangrene: Secondary | ICD-10-CM | POA: Diagnosis present

## 2019-08-12 DIAGNOSIS — Z951 Presence of aortocoronary bypass graft: Secondary | ICD-10-CM

## 2019-08-12 DIAGNOSIS — Z833 Family history of diabetes mellitus: Secondary | ICD-10-CM

## 2019-08-12 DIAGNOSIS — E1122 Type 2 diabetes mellitus with diabetic chronic kidney disease: Secondary | ICD-10-CM | POA: Diagnosis present

## 2019-08-12 DIAGNOSIS — I340 Nonrheumatic mitral (valve) insufficiency: Secondary | ICD-10-CM | POA: Diagnosis present

## 2019-08-12 DIAGNOSIS — Z7902 Long term (current) use of antithrombotics/antiplatelets: Secondary | ICD-10-CM

## 2019-08-12 DIAGNOSIS — R0689 Other abnormalities of breathing: Secondary | ICD-10-CM | POA: Diagnosis not present

## 2019-08-12 DIAGNOSIS — F419 Anxiety disorder, unspecified: Secondary | ICD-10-CM | POA: Diagnosis present

## 2019-08-12 DIAGNOSIS — I110 Hypertensive heart disease with heart failure: Secondary | ICD-10-CM | POA: Diagnosis not present

## 2019-08-12 DIAGNOSIS — R739 Hyperglycemia, unspecified: Secondary | ICD-10-CM

## 2019-08-12 DIAGNOSIS — Z9111 Patient's noncompliance with dietary regimen: Secondary | ICD-10-CM

## 2019-08-12 DIAGNOSIS — I429 Cardiomyopathy, unspecified: Secondary | ICD-10-CM | POA: Diagnosis present

## 2019-08-12 DIAGNOSIS — F17210 Nicotine dependence, cigarettes, uncomplicated: Secondary | ICD-10-CM | POA: Diagnosis present

## 2019-08-12 DIAGNOSIS — J441 Chronic obstructive pulmonary disease with (acute) exacerbation: Secondary | ICD-10-CM | POA: Diagnosis present

## 2019-08-12 DIAGNOSIS — R069 Unspecified abnormalities of breathing: Secondary | ICD-10-CM | POA: Diagnosis not present

## 2019-08-12 DIAGNOSIS — Z8249 Family history of ischemic heart disease and other diseases of the circulatory system: Secondary | ICD-10-CM | POA: Diagnosis not present

## 2019-08-12 DIAGNOSIS — I5023 Acute on chronic systolic (congestive) heart failure: Secondary | ICD-10-CM | POA: Diagnosis present

## 2019-08-12 DIAGNOSIS — Z79899 Other long term (current) drug therapy: Secondary | ICD-10-CM | POA: Diagnosis not present

## 2019-08-12 DIAGNOSIS — I509 Heart failure, unspecified: Secondary | ICD-10-CM

## 2019-08-12 DIAGNOSIS — Z20828 Contact with and (suspected) exposure to other viral communicable diseases: Secondary | ICD-10-CM | POA: Diagnosis present

## 2019-08-12 DIAGNOSIS — Z885 Allergy status to narcotic agent status: Secondary | ICD-10-CM

## 2019-08-12 DIAGNOSIS — Z86010 Personal history of colonic polyps: Secondary | ICD-10-CM

## 2019-08-12 DIAGNOSIS — Z85038 Personal history of other malignant neoplasm of large intestine: Secondary | ICD-10-CM

## 2019-08-12 DIAGNOSIS — R0902 Hypoxemia: Secondary | ICD-10-CM | POA: Diagnosis not present

## 2019-08-12 DIAGNOSIS — F329 Major depressive disorder, single episode, unspecified: Secondary | ICD-10-CM | POA: Diagnosis present

## 2019-08-12 DIAGNOSIS — I5021 Acute systolic (congestive) heart failure: Secondary | ICD-10-CM | POA: Diagnosis not present

## 2019-08-12 DIAGNOSIS — E1165 Type 2 diabetes mellitus with hyperglycemia: Secondary | ICD-10-CM | POA: Diagnosis present

## 2019-08-12 DIAGNOSIS — N183 Chronic kidney disease, stage 3 unspecified: Secondary | ICD-10-CM | POA: Diagnosis present

## 2019-08-12 DIAGNOSIS — I2510 Atherosclerotic heart disease of native coronary artery without angina pectoris: Secondary | ICD-10-CM | POA: Diagnosis present

## 2019-08-12 DIAGNOSIS — I252 Old myocardial infarction: Secondary | ICD-10-CM

## 2019-08-12 DIAGNOSIS — K219 Gastro-esophageal reflux disease without esophagitis: Secondary | ICD-10-CM | POA: Diagnosis present

## 2019-08-12 DIAGNOSIS — J80 Acute respiratory distress syndrome: Secondary | ICD-10-CM | POA: Diagnosis not present

## 2019-08-12 DIAGNOSIS — Z8349 Family history of other endocrine, nutritional and metabolic diseases: Secondary | ICD-10-CM

## 2019-08-12 DIAGNOSIS — R079 Chest pain, unspecified: Secondary | ICD-10-CM | POA: Diagnosis not present

## 2019-08-12 DIAGNOSIS — J9601 Acute respiratory failure with hypoxia: Secondary | ICD-10-CM | POA: Diagnosis present

## 2019-08-12 DIAGNOSIS — Z95810 Presence of automatic (implantable) cardiac defibrillator: Secondary | ICD-10-CM

## 2019-08-12 DIAGNOSIS — R0602 Shortness of breath: Secondary | ICD-10-CM | POA: Diagnosis not present

## 2019-08-12 DIAGNOSIS — I130 Hypertensive heart and chronic kidney disease with heart failure and stage 1 through stage 4 chronic kidney disease, or unspecified chronic kidney disease: Secondary | ICD-10-CM | POA: Diagnosis present

## 2019-08-12 DIAGNOSIS — N179 Acute kidney failure, unspecified: Secondary | ICD-10-CM | POA: Diagnosis present

## 2019-08-12 DIAGNOSIS — I059 Rheumatic mitral valve disease, unspecified: Secondary | ICD-10-CM | POA: Diagnosis not present

## 2019-08-12 LAB — CBC WITH DIFFERENTIAL/PLATELET
Abs Immature Granulocytes: 0.1 10*3/uL — ABNORMAL HIGH (ref 0.00–0.07)
Basophils Absolute: 0.2 10*3/uL — ABNORMAL HIGH (ref 0.0–0.1)
Basophils Relative: 1 %
Eosinophils Absolute: 0.3 10*3/uL (ref 0.0–0.5)
Eosinophils Relative: 2 %
HCT: 40.6 % (ref 36.0–46.0)
Hemoglobin: 13.3 g/dL (ref 12.0–15.0)
Immature Granulocytes: 1 %
Lymphocytes Relative: 18 %
Lymphs Abs: 2.4 10*3/uL (ref 0.7–4.0)
MCH: 32 pg (ref 26.0–34.0)
MCHC: 32.8 g/dL (ref 30.0–36.0)
MCV: 97.6 fL (ref 80.0–100.0)
Monocytes Absolute: 0.8 10*3/uL (ref 0.1–1.0)
Monocytes Relative: 6 %
Neutro Abs: 9.5 10*3/uL — ABNORMAL HIGH (ref 1.7–7.7)
Neutrophils Relative %: 72 %
Platelets: 425 10*3/uL — ABNORMAL HIGH (ref 150–400)
RBC: 4.16 MIL/uL (ref 3.87–5.11)
RDW: 15.4 % (ref 11.5–15.5)
WBC: 13.2 10*3/uL — ABNORMAL HIGH (ref 4.0–10.5)
nRBC: 0 % (ref 0.0–0.2)

## 2019-08-12 LAB — BLOOD GAS, VENOUS
Acid-base deficit: 5.7 mmol/L — ABNORMAL HIGH (ref 0.0–2.0)
Bicarbonate: 20.7 mmol/L (ref 20.0–28.0)
O2 Saturation: 90.5 %
Patient temperature: 37
pCO2, Ven: 43 mmHg — ABNORMAL LOW (ref 44.0–60.0)
pH, Ven: 7.29 (ref 7.250–7.430)
pO2, Ven: 67 mmHg — ABNORMAL HIGH (ref 32.0–45.0)

## 2019-08-12 LAB — TROPONIN I (HIGH SENSITIVITY)
Troponin I (High Sensitivity): 19 ng/L — ABNORMAL HIGH (ref <18)
Troponin I (High Sensitivity): 26 ng/L — ABNORMAL HIGH (ref <18)

## 2019-08-12 LAB — BASIC METABOLIC PANEL
Anion gap: 17 — ABNORMAL HIGH (ref 5–15)
BUN: 16 mg/dL (ref 8–23)
CO2: 18 mmol/L — ABNORMAL LOW (ref 22–32)
Calcium: 8.9 mg/dL (ref 8.9–10.3)
Chloride: 100 mmol/L (ref 98–111)
Creatinine, Ser: 1.3 mg/dL — ABNORMAL HIGH (ref 0.44–1.00)
GFR calc Af Amer: 50 mL/min — ABNORMAL LOW (ref >60)
GFR calc non Af Amer: 43 mL/min — ABNORMAL LOW (ref >60)
Glucose, Bld: 450 mg/dL — ABNORMAL HIGH (ref 70–99)
Potassium: 4.2 mmol/L (ref 3.5–5.1)
Sodium: 135 mmol/L (ref 135–145)

## 2019-08-12 LAB — SARS CORONAVIRUS 2 BY RT PCR (HOSPITAL ORDER, PERFORMED IN ~~LOC~~ HOSPITAL LAB): SARS Coronavirus 2: NEGATIVE

## 2019-08-12 LAB — BRAIN NATRIURETIC PEPTIDE: B Natriuretic Peptide: >4500 pg/mL — ABNORMAL HIGH (ref 0.0–100.0)

## 2019-08-12 LAB — MRSA PCR SCREENING: MRSA by PCR: NEGATIVE

## 2019-08-12 LAB — BETA-HYDROXYBUTYRIC ACID: Beta-Hydroxybutyric Acid: 0.27 mmol/L (ref 0.05–0.27)

## 2019-08-12 MED ORDER — CHLORHEXIDINE GLUCONATE CLOTH 2 % EX PADS
6.0000 | MEDICATED_PAD | Freq: Every day | CUTANEOUS | Status: DC
  Administered 2019-08-12: 6 via TOPICAL

## 2019-08-12 MED ORDER — ONDANSETRON HCL 4 MG/2ML IJ SOLN
4.0000 mg | Freq: Once | INTRAMUSCULAR | Status: AC
  Administered 2019-08-12: 4 mg via INTRAVENOUS

## 2019-08-12 MED ORDER — METHYLPREDNISOLONE SODIUM SUCC 125 MG IJ SOLR
125.0000 mg | Freq: Once | INTRAMUSCULAR | Status: AC
  Administered 2019-08-12: 08:00:00 125 mg via INTRAVENOUS
  Filled 2019-08-12: qty 2

## 2019-08-12 MED ORDER — SODIUM CHLORIDE 0.9% FLUSH: 3.0000 mL | INTRAVENOUS | Status: DC | PRN

## 2019-08-12 MED ORDER — ATORVASTATIN CALCIUM 20 MG PO TABS
40.0000 mg | ORAL_TABLET | Freq: Every day | ORAL | Status: DC
  Administered 2019-08-12 – 2019-08-15 (×4): 40 mg via ORAL
  Filled 2019-08-12 (×4): qty 2

## 2019-08-12 MED ORDER — ENOXAPARIN SODIUM 40 MG/0.4ML ~~LOC~~ SOLN: 40.0000 mg | SUBCUTANEOUS | Status: DC

## 2019-08-12 MED ORDER — PANTOPRAZOLE SODIUM 40 MG PO TBEC
40.0000 mg | DELAYED_RELEASE_TABLET | Freq: Every day | ORAL | Status: DC
  Administered 2019-08-12 – 2019-08-15 (×4): 40 mg via ORAL
  Filled 2019-08-12 (×4): qty 1

## 2019-08-12 MED ORDER — ACETAMINOPHEN 325 MG PO TABS: 650.0000 mg | ORAL_TABLET | ORAL | Status: DC | PRN

## 2019-08-12 MED ORDER — CLOPIDOGREL BISULFATE 75 MG PO TABS
75.0000 mg | ORAL_TABLET | Freq: Every day | ORAL | Status: DC
  Administered 2019-08-12 – 2019-08-15 (×4): 75 mg via ORAL
  Filled 2019-08-12 (×4): qty 1

## 2019-08-12 MED ORDER — FERROUS SULFATE 325 (65 FE) MG PO TABS
325.0000 mg | ORAL_TABLET | Freq: Every day | ORAL | Status: DC
  Administered 2019-08-13 – 2019-08-15 (×3): 325 mg via ORAL
  Filled 2019-08-12 (×3): qty 1

## 2019-08-12 MED ORDER — ALPRAZOLAM 0.5 MG PO TABS
0.5000 mg | ORAL_TABLET | Freq: Every evening | ORAL | Status: DC | PRN
  Administered 2019-08-15: 0.5 mg via ORAL
  Filled 2019-08-12: qty 1

## 2019-08-12 MED ORDER — IPRATROPIUM-ALBUTEROL 0.5-2.5 (3) MG/3ML IN SOLN
3.0000 mL | RESPIRATORY_TRACT | Status: AC | PRN
  Administered 2019-08-12 (×3): 3 mL via RESPIRATORY_TRACT
  Filled 2019-08-12: qty 6
  Filled 2019-08-12: qty 3

## 2019-08-12 MED ORDER — CARVEDILOL 3.125 MG PO TABS
3.1250 mg | ORAL_TABLET | Freq: Two times a day (BID) | ORAL | Status: DC
  Administered 2019-08-13 – 2019-08-15 (×5): 3.125 mg via ORAL
  Filled 2019-08-12 (×5): qty 1

## 2019-08-12 MED ORDER — ESCITALOPRAM OXALATE 10 MG PO TABS
10.0000 mg | ORAL_TABLET | Freq: Every day | ORAL | Status: DC
  Administered 2019-08-12 – 2019-08-15 (×4): 10 mg via ORAL
  Filled 2019-08-12 (×4): qty 1

## 2019-08-12 MED ORDER — SODIUM CHLORIDE 0.9% FLUSH
3.0000 mL | Freq: Two times a day (BID) | INTRAVENOUS | Status: DC
  Administered 2019-08-12 – 2019-08-15 (×6): 3 mL via INTRAVENOUS

## 2019-08-12 MED ORDER — HYDRALAZINE HCL 20 MG/ML IJ SOLN: 10.0000 mg | Freq: Four times a day (QID) | INTRAMUSCULAR | Status: DC | PRN

## 2019-08-12 MED ORDER — LEVALBUTEROL HCL 0.63 MG/3ML IN NEBU: 0.6300 mg | INHALATION_SOLUTION | Freq: Four times a day (QID) | RESPIRATORY_TRACT | Status: DC | PRN

## 2019-08-12 MED ORDER — SACUBITRIL-VALSARTAN 24-26 MG PO TABS
1.0000 | ORAL_TABLET | Freq: Two times a day (BID) | ORAL | Status: DC
  Administered 2019-08-12 – 2019-08-13 (×3): 1 via ORAL
  Filled 2019-08-12 (×4): qty 1

## 2019-08-12 MED ORDER — ASPIRIN EC 81 MG PO TBEC
81.0000 mg | DELAYED_RELEASE_TABLET | Freq: Every day | ORAL | Status: DC
  Administered 2019-08-12 – 2019-08-15 (×4): 81 mg via ORAL
  Filled 2019-08-12 (×5): qty 1

## 2019-08-12 MED ORDER — ENOXAPARIN SODIUM 40 MG/0.4ML ~~LOC~~ SOLN
40.0000 mg | SUBCUTANEOUS | Status: DC
  Administered 2019-08-12 – 2019-08-14 (×3): 40 mg via SUBCUTANEOUS
  Filled 2019-08-12 (×3): qty 0.4

## 2019-08-12 MED ORDER — NITROGLYCERIN 2 % TD OINT
1.0000 [in_us] | TOPICAL_OINTMENT | Freq: Once | TRANSDERMAL | Status: AC
  Administered 2019-08-12: 1 [in_us] via TOPICAL
  Filled 2019-08-12: qty 1

## 2019-08-12 MED ORDER — SPIRONOLACTONE 25 MG PO TABS
12.5000 mg | ORAL_TABLET | Freq: Every day | ORAL | Status: DC
  Administered 2019-08-12 – 2019-08-13 (×2): 12.5 mg via ORAL
  Filled 2019-08-12 (×2): qty 0.5
  Filled 2019-08-12: qty 1

## 2019-08-12 MED ORDER — BUDESONIDE 0.25 MG/2ML IN SUSP
0.2500 mg | Freq: Two times a day (BID) | RESPIRATORY_TRACT | Status: DC
  Administered 2019-08-12 – 2019-08-15 (×6): 0.25 mg via RESPIRATORY_TRACT
  Filled 2019-08-12 (×6): qty 2

## 2019-08-12 MED ORDER — ONDANSETRON HCL 4 MG/2ML IJ SOLN
INTRAMUSCULAR | Status: AC
  Filled 2019-08-12: qty 2

## 2019-08-12 MED ORDER — METHYLPREDNISOLONE SODIUM SUCC 125 MG IJ SOLR
60.0000 mg | Freq: Four times a day (QID) | INTRAMUSCULAR | Status: DC
  Filled 2019-08-12: qty 2

## 2019-08-12 MED ORDER — METHYLPREDNISOLONE SODIUM SUCC 125 MG IJ SOLR: 60.0000 mg | Freq: Four times a day (QID) | INTRAMUSCULAR | Status: DC

## 2019-08-12 MED ORDER — ONDANSETRON HCL 4 MG/2ML IJ SOLN: 4.0000 mg | Freq: Four times a day (QID) | INTRAMUSCULAR | Status: DC | PRN

## 2019-08-12 MED ORDER — CYANOCOBALAMIN 1000 MCG/ML IJ SOLN
1000.0000 ug | INTRAMUSCULAR | Status: DC
  Administered 2019-08-13: 1000 ug via INTRAMUSCULAR
  Filled 2019-08-12: qty 1

## 2019-08-12 MED ORDER — SODIUM CHLORIDE 0.9 % IV SOLN: 250.0000 mL | INTRAVENOUS | Status: DC | PRN

## 2019-08-12 MED ORDER — SODIUM CHLORIDE 0.9 % IV SOLN: INTRAVENOUS | Status: DC

## 2019-08-12 MED ORDER — TRAZODONE HCL 50 MG PO TABS
25.0000 mg | ORAL_TABLET | Freq: Every day | ORAL | Status: DC
  Administered 2019-08-12 – 2019-08-14 (×3): 50 mg via ORAL
  Filled 2019-08-12 (×3): qty 1

## 2019-08-12 MED ORDER — NITROGLYCERIN 0.4 MG SL SUBL
0.4000 mg | SUBLINGUAL_TABLET | SUBLINGUAL | Status: DC | PRN
  Administered 2019-08-12: 0.4 mg via SUBLINGUAL
  Filled 2019-08-12: qty 1

## 2019-08-12 MED ORDER — FUROSEMIDE 10 MG/ML IJ SOLN
80.0000 mg | Freq: Once | INTRAMUSCULAR | Status: AC
  Administered 2019-08-12: 09:00:00 80 mg via INTRAVENOUS
  Filled 2019-08-12: qty 8

## 2019-08-12 MED ORDER — FUROSEMIDE 10 MG/ML IJ SOLN
40.0000 mg | Freq: Two times a day (BID) | INTRAMUSCULAR | Status: DC
  Administered 2019-08-12 – 2019-08-13 (×2): 40 mg via INTRAVENOUS
  Filled 2019-08-12 (×2): qty 4

## 2019-08-12 NOTE — Progress Notes (Signed)
ReDS clip reading of 27%

## 2019-08-12 NOTE — Progress Notes (Signed)
Pt arrived on unit at this time. Pt is alert and oriented. No s/s of distress noted. Breathing is slightly labored, breathing about 30 times per minute on bipap. Pt is complaining about mouth being dry. Pt denies pain. All VSS at this time.

## 2019-08-12 NOTE — ED Triage Notes (Signed)
[facility] called out to home for resp. Distress. On arrival pt reports she has been in the tripod position for 3hours. pmh of CHF. On arrival per EMS pt had no air movement. O2 sat 85%. Pt placed on CPAP o2 increased to 96%. Pts' SBP in mid 140's.   Pt visibly has increased WOB.

## 2019-08-12 NOTE — Consult Note (Signed)
Cardiology Consultation Note    Patient ID: Ashley [name], MRN: [id number], DOB/AGE: Mar 20, 1951 67 y.o. Admit date: 08/12/2019   Date of Consult: 08/12/2019 Primary Physician: [name], MD Primary Cardiologist: Dr. Saralyn Pilar  Chief Complaint: sob Reason for Consultation: sob Requesting MD: Dr. Posey Pronto  HPI: Ashley [name] is a 68 y.o. female with history of coronary artery disease status post non-ST elevation myocardial infarction in 2007 status post coronary bypass grafting x3 with mitral valve repair including a left internal mammary to the LAD, saphenous vein graft to OM1 and PDA in January 2007.  Echocardiogram done in July 21 of this year was read as showing ejection fraction of 40 to 45% with moderate asymmetric left ventricular hypertrophy.  There is RV enlargement.  There is moderate to severe mitral stenosis with a mean gradient of 15 mmHg which was also seen associated with moderate to severe mitral regurgitation.  She had a biventricular ICD placed in March 2017 for primary prevention.  She has a history of COPD and continues to smoke cigarettes at least a half a pack of cigarettes a day.  She has had several admissions to [facility] recently with fairly rapid respiratory demise.  She improved with diuresis and bronchodilators.  Currently as an outpatient she is being treated with aspirin 81 mg daily, atorvastatin 40 mg daily, carvedilol 6.25 mg twice daily, clopidogrel daily, furosemide 40 mg daily, Entresto 24-26 mg twice daily.  She recently has been on several prednisone tapers.  She admits to noncompliance with dietary restrictions eating a fairly high sodium high glucose diet.  She does daily weights but does not react to them.  She has noted that over the past several days a gradual increase in shortness of breath as well as an increase in her weight.  Peak troponin was 26.  Her BNP was greater than 4500 on admission.  3 weeks ago with the previous admission it was similar.  Her  BNP's have ranged in the 12-3998 range over the past several months.  She has acute on chronic renal insufficiency with a creatinine of 1.3 up from a baseline of 0.99.  Chest x-ray reveals mild stable cardiomegaly with mild to moderate interstitial edema.  Electrocardiogram revealed atrial sensed ventricular paced tachycardia.  She denies chest pain.  Venous blood gas revealed a pH of 7.29 a PCO2 of 43 with a PO2 of 67.  Past Medical History:  Diagnosis Date  . AICD (automatic cardioverter/defibrillator) present    on right side  . Anemia   . CAD (coronary artery disease)    s/p CABG  . CHF (congestive heart failure) ([place])   . Chronic kidney disease    renal artery stenosis  . Colon cancer ([place])   . Depression   . GERD (gastroesophageal reflux disease)   . Hx of colonic polyps   . Hyperlipidemia   . Hypertension   . Mitral valve disorder    s/p mitral valve repair wth CABG  . Myocardial infarction ([place])   . Peripheral vascular disease ([place])   . Presence of permanent cardiac pacemaker    Pacemaker/ Defibrillator      Surgical History:  Past Surgical History:  Procedure Laterality Date  . arm surgery     fracture, has plates and screws  . CABG with mitral valve repair    . CARDIAC CATHETERIZATION    . COLON SURGERY     colon cancer  . COLONOSCOPY WITH PROPOFOL N/A 10/09/2016   Procedure: COLONOSCOPY  WITH PROPOFOL;  Surgeon: Jonathon Bellows, MD;  Location: [facility] ENDOSCOPY;  Service: Endoscopy;  Laterality: N/A;  . COLONOSCOPY WITH PROPOFOL N/A 07/24/2018   Procedure: COLONOSCOPY WITH PROPOFOL;  Surgeon: Virgel Manifold, MD;  Location: [facility] ENDOSCOPY;  Service: Endoscopy;  Laterality: N/A;  . CORONARY ARTERY BYPASS GRAFT    . ENTEROSCOPY N/A 11/21/2018   Procedure: ENTEROSCOPY-BALLOON;  Surgeon: Jonathon Bellows, MD;  Location: [facility] ENDOSCOPY;  Service: Gastroenterology;  Laterality: N/A;  . ESOPHAGOGASTRODUODENOSCOPY (EGD) WITH PROPOFOL N/A 07/24/2018   Procedure:  ESOPHAGOGASTRODUODENOSCOPY (EGD) WITH PROPOFOL;  Surgeon: Virgel Manifold, MD;  Location: [facility] ENDOSCOPY;  Service: Endoscopy;  Laterality: N/A;  . GIVENS CAPSULE STUDY N/A 08/16/2018   Procedure: GIVENS CAPSULE STUDY;  Surgeon: Virgel Manifold, MD;  Location: [facility] ENDOSCOPY;  Service: Endoscopy;  Laterality: N/A;  . pace maker defib  2016  . PERIPHERAL VASCULAR CATHETERIZATION N/A 10/23/2016   Procedure: Renal Angiography;  Surgeon: Algernon Huxley, MD;  Location: [place] CV LAB;  Service: Cardiovascular;  Laterality: N/A;  . TOTAL ABDOMINAL HYSTERECTOMY  1999   history of abnormal pap     Home Meds: Prior to Admission medications   Medication Sig Start Date End Date Taking? Authorizing Provider  albuterol (VENTOLIN HFA) 108 (90 Base) MCG/ACT inhaler INHALE 2 PUFFS BY MOUTH INTO THE LUNGS EVERY 6 HOURS AS NEEDED FOR WHEEZING Patient taking differently: Inhale 2 puffs into the lungs every 6 (six) hours as needed for wheezing or shortness of breath. INHALE 2 PUFFS BY MOUTH INTO THE LUNGS EVERY 6 HOURS AS NEEDED FOR WHEEZING 06/03/19  Yes [name], MD  cyanocobalamin (,VITAMIN B-12,) 1000 MCG/ML injection Inject 1 mL weekly into the muscle monthly Patient taking differently: Inject 1,000 mcg into the muscle every 30 (thirty) days.  11/08/18  Yes [name], MD  ipratropium-albuterol (DUONEB) 0.5-2.5 (3) MG/3ML SOLN Take 3 mLs by nebulization every 6 (six) hours as needed. Patient taking differently: Take 3 mLs by nebulization every 6 (six) hours as needed (respiratory symptoms).  08/05/18  Yes [name], MD  nitroGLYCERIN (NITROSTAT) 0.4 MG SL tablet Place 1 tablet (0.4 mg total) under the tongue every 5 (five) minutes as needed for chest pain. 06/19/16  Yes [name], MD  acetaminophen (TYLENOL) 500 MG tablet Maximum 6 tablets a day. Patient taking differently: Take 500 mg by mouth every 4 (four) hours as needed for fever. Maximum 6 tablets a day. 02/09/15   Rubbie Battiest, RN  ALPRAZolam Duanne Moron) 0.5 MG tablet Take 0.5-1 tablets (0.25-0.5 mg total) by mouth at bedtime. TAKE ONE TABLET BY MOUTH AT BEDTIME AS NEEDED FOR ANXIETY 02/24/19   [name], MD  aspirin 81 MG tablet Take 81 mg by mouth daily.    [provider]  atorvastatin (LIPITOR) 40 MG tablet TAKE ONE TABLET BY MOUTH ONCE DAILY 07/25/19   [name], MD  carvedilol (COREG) 3.125 MG tablet Take 1 tablet (3.125 mg total) by mouth 2 (two) times daily with a meal. 07/19/19   Loletha Grayer, MD  clopidogrel (PLAVIX) 75 MG tablet Take 1 tablet (75 mg total) by mouth daily. 06/03/19   [name], MD  diphenhydrAMINE (DIPHENHIST) 25 mg capsule Take 25 mg by mouth at bedtime as needed.     [provider]  escitalopram (LEXAPRO) 10 MG tablet Take 1 tablet (10 mg total) by mouth daily. 06/03/19   [name], MD  ferrous sulfate 325 (65 FE) MG EC tablet  Take 325 mg by mouth daily with breakfast.    [provider]  Fluticasone-Umeclidin-Vilant (TRELEGY ELLIPTA) 100-62.5-25 MCG/INH AEPB Inhale 1 puff into the lungs daily.    [provider]  furosemide (LASIX) 40 MG tablet One tablet twice a day 07/19/19   Loletha Grayer, MD  omeprazole (PRILOSEC) 40 MG capsule Take 1 capsule (40 mg total) by mouth daily. 06/03/19   [name], MD  potassium chloride (K-DUR) 10 MEQ tablet Take 1 tablet (10 mEq total) by mouth daily. 06/03/19   [name], MD  potassium chloride SA (KLOR-CON M20) 20 MEQ tablet Take 1 tablet (20 mEq total) by mouth daily. 08/03/19   Hinda Kehr, MD  sacubitril-valsartan (ENTRESTO) 24-26 MG Take 1 tablet by mouth 2 (two) times daily. 07/19/19   Loletha Grayer, MD  spironolactone (ALDACTONE) 25 MG tablet Take 0.5 tablets (12.5 mg total) by mouth daily. 07/28/19 07/27/20  [name], MD  traZODone (DESYREL) 50 MG tablet Take 0.5-1 tablets (25-50 mg total) by mouth daily. One hour before bedtime 06/03/19   [name], MD     Inpatient Medications:  . aspirin EC  81 mg Oral Daily  . atorvastatin  40 mg Oral Daily  . budesonide (PULMICORT) nebulizer solution  0.25 mg Nebulization BID  . carvedilol  3.125 mg Oral BID WC  . Chlorhexidine Gluconate Cloth  6 each Topical Daily  . clopidogrel  75 mg Oral Daily  . [START ON 08/13/2019] cyanocobalamin  1,000 mcg Intramuscular Q30 days  . enoxaparin (LOVENOX) injection  40 mg Subcutaneous Q24H  . escitalopram  10 mg Oral Daily  . [START ON 08/13/2019] ferrous sulfate  325 mg Oral Q breakfast  . furosemide  40 mg Intravenous BID  . pantoprazole  40 mg Oral Daily  . sacubitril-valsartan  1 tablet Oral BID  . sodium chloride flush  3 mL Intravenous Q12H  . spironolactone  12.5 mg Oral Daily  . traZODone  25-50 mg Oral Daily   . sodium chloride      Allergies:  Allergies  Allergen Reactions  . Hydrocodone-Acetaminophen Nausea Only    Social History   Socioeconomic History  . Marital status: Widowed    Spouse name: Not on file  . Number of children: 0  . Years of education: 35  . Highest education level: 11th grade  Occupational History  . Occupation: disabled  Social Needs  . Financial resource strain: Not hard at all  . Food insecurity    Worry: Never true    Inability: Never true  . Transportation needs    Medical: No    Non-medical: No  Tobacco Use  . Smoking status: Former Smoker    Packs/day: 0.25    Types: Cigarettes    Quit date: 06/19/2019    Years since quitting: 0.1  . Smokeless tobacco: Never Used  . Tobacco comment: reports smoking 2-3 cigarettes/ day  Substance and Sexual Activity  . Alcohol use: Yes    Alcohol/week: 2.0 standard drinks    Types: 2 Shots of liquor per week    Comment: occasion  . Drug use: No  . Sexual activity: Yes    Comment: 1 partner  Lifestyle  . Physical activity    Days per week: 0 days    Minutes per session: Not on file  . Stress: Only a little  Relationships  . Social connections    Talks on  phone: Once a week    Gets together: Once a  week    Attends religious service: Never    Active member of club or organization: No    Attends meetings of clubs or organizations: Never    Relationship status: Living with partner  . Intimate partner violence    Fear of current or ex partner: No    Emotionally abused: No    Physically abused: No    Forced sexual activity: No  Other Topics Concern  . Not on file  Social History Narrative   07/01/2019: Patient reports she has stopped smoking completely since she was hospitalized on June 19, 2019     Family History  Problem Relation Age of Onset  . Arthritis Mother   . Cancer Mother        uterus cancer  . Hyperlipidemia Mother   . Hypertension Mother   . Heart disease Mother   . Diabetes Mother   . Hyperlipidemia Father   . Hypertension Father   . Heart disease Father   . Diabetes Father   . Cancer Sister        ovary cancer  . Diabetes Maternal Grandmother   . Hypertension Maternal Grandmother   . Arthritis Maternal Grandmother   . Hypertension Maternal Grandfather   . Hypertension Paternal Grandmother   . Hypertension Paternal Grandfather   . Heart disease Paternal Grandfather   . Heart disease Brother   . Kidney disease Brother   . Breast cancer Neg Hx      Review of Systems: A 12-system review of systems was performed and is negative except as noted in the HPI.  Labs: No results for input(s): CKTOTAL, CKMB, TROPONINI in the last 72 hours. Lab Results  Component Value Date   WBC 13.2 (H) 08/12/2019   HGB 13.3 08/12/2019   HCT 40.6 08/12/2019   MCV 97.6 08/12/2019   PLT 425 (H) 08/12/2019    Recent Labs  Lab 08/12/19 0752  NA 135  K 4.2  CL 100  CO2 18*  BUN 16  CREATININE 1.30*  CALCIUM 8.9  GLUCOSE 450*   Lab Results  Component Value Date   CHOL 148 11/08/2018   HDL 39.60 11/08/2018   LDLCALC 139 (H) 05/06/2018   TRIG (H) 11/08/2018    550.0 Triglyceride is over 400; calculations on Lipids are  invalid.   No results found for: DDIMER  Radiology/Studies:  Dg Chest 2 View  Result Date: 08/02/2019 CLINICAL DATA:  Chest pain EXAM: CHEST - 2 VIEW COMPARISON:  07/16/2019 FINDINGS: Surgical plate and fixating screws in the left clavicle. Post sternotomy changes. Right-sided pacing device with multiple leads. Mild cardiomegaly. Coarse interstitial opacity, likely chronic change. No acute consolidation or pleural effusion. IMPRESSION: No active cardiopulmonary disease.  Mild cardiomegaly. Electronically Signed   By: Donavan Foil M.D.   On: 08/02/2019 23:33   Dg Chest Portable 1 View  Result Date: 08/12/2019 CLINICAL DATA:  Shortness of breath and hypoxia. EXAM: PORTABLE CHEST 1 VIEW COMPARISON:  08/02/2019 FINDINGS: Right-sided pacemaker unchanged. Lungs are adequately inflated demonstrate moderate hazy prominence of the central perihilar vasculature compatible mild to moderate interstitial edema. No effusion. Mild stable cardiomegaly. Remainder of the exam is unchanged. IMPRESSION: Mild stable cardiomegaly with mild to moderate interstitial edema. Infection less likely. Electronically Signed   By: Marin Olp M.D.   On: 08/12/2019 08:18   Dg Chest Port 1 View  Result Date: 07/16/2019 CLINICAL DATA:  Shortness of breath. Hypoxia. EXAM: PORTABLE CHEST 1 VIEW COMPARISON:  06/21/2019 FINDINGS: Patient is new extensive bilateral  pulmonary edema. Chronic cardiomegaly. AICD in place. CABG. No acute bone abnormality. No discrete effusions. Aortic atherosclerosis. IMPRESSION: New extensive bilateral pulmonary edema. Aortic Atherosclerosis (ICD10-I70.0). Electronically Signed   By: Lorriane Shire M.D.   On: 07/16/2019 12:52    Wt Readings from Last 3 Encounters:  08/12/19 72.8 kg  08/02/19 70.8 kg  07/24/19 70.8 kg    EKG: Atrial sensed ventricular paced tachycardia  Physical Exam:  Blood pressure 118/72, pulse (!) 112, temperature (!) 97.5 F (36.4 C), temperature source Axillary, resp. rate  (!) 21, height 5\' 4"  (1.626 m), weight 72.8 kg, SpO2 92 %. Body mass index is 27.55 kg/m. General: Well developed, well nourished, in no acute distress. Head: Normocephalic, atraumatic, sclera non-icteric, no xanthomas, nares are without discharge.  Neck: Negative for carotid bruits. JVD not elevated. Lungs: Bilateral decreased breath sounds with crackles bilaterally. Heart: RRR with S1 S2. No murmurs, rubs, or gallops appreciated. Abdomen: Soft, non-tender, non-distended with normoactive bowel sounds. No hepatomegaly. No rebound/guarding. No obvious abdominal masses. Msk:  Strength and tone appear normal for age. Extremities: No clubbing or cyanosis. No edema.  Distal pedal pulses are 2+ and equal bilaterally. Neuro: Alert and oriented X 3. No facial asymmetry. No focal deficit. Moves all extremities spontaneously. Psych:  Responds to questions appropriately with a normal affect.     Assessment and Plan  68 year old female with history of coronary artery disease status post coronary artery bypass grafting x3 as well as a history of a tissue mitral prosthesis done at the time of her coronary bypass grafting he has had fairly frequent admissions recently with progressive shortness of breath hypoxia with a chest x-ray on this admission showing evidence of pulmonary edema.  She admits to being fairly noncompliant with diet including sodium and keeping track of her weight and reacting with diuretics.  She continues to smoke cigarettes.  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  Etiology of symptoms is likely multifactorial to include worsening mitral valve disease along with worsening pulmonary disease and continued smoking.  Mitral valve disease.  Echo 2 months ago revealed an EF of 45 to 50% with moderate to severe mitral stenosis moderate deficiency of severe mitral regurgitation.  Will need to carefully diurese  following renal function and consider assessment for re-repair of her mitral valve.  This intervention would be somewhat high risk secondary to her respiratory status, continued tobacco abuse.  Smoking cessation has been discussed and will continue to be discussed.  Close follow-up of her hemodynamics and renal function along with careful diuresis  COPD, agree with current therapy.  Again smoke cessation is critical.  Cardiomyopathy.  EF with biventricular pacing is 40 to 45%.  We will continue to follow.  Would continue with outpatient medications including Entresto, aspirin and Plavix, carvedilol.  Signed, Teodoro Spray MD 08/12/2019, 1:13 PM Pager: [phone]

## 2019-08-12 NOTE — Consult Note (Addendum)
Name: Ashley [name] MRN: [id number] DOB: [birth year]     CONSULTATION DATE: 08/12/2019  REFERRING MD :  Dr. Charna Archer  CHIEF COMPLAINT:  Respiratory Distress  STUDIES:  10/6 Admitted to ICU on BiPAP with diagnosis of CHF exacerbation. 10/6 CXR shows acute interstitial edema, with infectious process unlikely. BNP >4,500  HISTORY OF PRESENT ILLNESS:   Ashley [name] is a 68 yo female with past medical history of CAD s/p CABG, CHF (EF 45%), mitral regurgitation, COPD, and CKD who presents to the ED in respiratory distress since about 3am this morning. She was transferred to the ICU on BiPAP which relieved her SOB. She does not wear oxygen at baseline, but endorses several of these episodes over the past several months leading to multiple hospitalizations. Pt endorses intermittent substernal sharp chest pains that last for only seconds over the past few weeks. Pt denies new cough or URI symptoms, fevers, or swelling in her legs. She endorses compliance with all prescribed medications. She quit smoking June 19, 2019.  Patient remains on biPAP  PAST MEDICAL HISTORY :   has a past medical history of AICD (automatic cardioverter/defibrillator) present, Anemia, CAD (coronary artery disease), CHF (congestive heart failure) ([place]), Chronic kidney disease, Colon cancer ([place]), Depression, GERD (gastroesophageal reflux disease), colonic polyps, Hyperlipidemia, Hypertension, Mitral valve disorder, Myocardial infarction [facility]), Peripheral vascular disease ([place]), and Presence of permanent cardiac pacemaker.  has a past surgical history that includes Total abdominal hysterectomy (1999); CABG with mitral valve repair; Colon surgery; arm surgery; pace maker defib (2016); Coronary artery bypass graft; Colonoscopy with propofol (N/A, 10/09/2016); Cardiac catheterization (N/A, 10/23/2016); Colonoscopy with propofol (N/A, 07/24/2018); Esophagogastroduodenoscopy (egd) with propofol (N/A, 07/24/2018); Givens capsule  study (N/A, 08/16/2018); Cardiac catheterization; and enteroscopy (N/A, 11/21/2018). Prior to Admission medications   Medication Sig Start Date End Date Taking? Authorizing Provider  albuterol (VENTOLIN HFA) 108 (90 Base) MCG/ACT inhaler INHALE 2 PUFFS BY MOUTH INTO THE LUNGS EVERY 6 HOURS AS NEEDED FOR WHEEZING Patient taking differently: Inhale 2 puffs into the lungs every 6 (six) hours as needed for wheezing or shortness of breath. INHALE 2 PUFFS BY MOUTH INTO THE LUNGS EVERY 6 HOURS AS NEEDED FOR WHEEZING 06/03/19  Yes [name], MD  cyanocobalamin (,VITAMIN B-12,) 1000 MCG/ML injection Inject 1 mL weekly into the muscle monthly Patient taking differently: Inject 1,000 mcg into the muscle every 30 (thirty) days.  11/08/18  Yes [name], MD  ipratropium-albuterol (DUONEB) 0.5-2.5 (3) MG/3ML SOLN Take 3 mLs by nebulization every 6 (six) hours as needed. Patient taking differently: Take 3 mLs by nebulization every 6 (six) hours as needed (respiratory symptoms).  08/05/18  Yes [name], MD  nitroGLYCERIN (NITROSTAT) 0.4 MG SL tablet Place 1 tablet (0.4 mg total) under the tongue every 5 (five) minutes as needed for chest pain. 06/19/16  Yes [name], MD  acetaminophen (TYLENOL) 500 MG tablet Maximum 6 tablets a day. Patient taking differently: Take 500 mg by mouth every 4 (four) hours as needed for fever. Maximum 6 tablets a day. 02/09/15   Rubbie Battiest, RN  ALPRAZolam Duanne Moron) 0.5 MG tablet Take 0.5-1 tablets (0.25-0.5 mg total) by mouth at bedtime. TAKE ONE TABLET BY MOUTH AT BEDTIME AS NEEDED FOR ANXIETY 02/24/19   [name], MD  aspirin 81 MG tablet Take 81 mg by mouth daily.    [provider]  atorvastatin (LIPITOR) 40 MG tablet TAKE ONE TABLET BY MOUTH ONCE DAILY 07/25/19   [name],  MD  carvedilol (COREG) 3.125 MG tablet Take 1 tablet (3.125 mg total) by mouth 2 (two) times daily with a meal. 07/19/19   Loletha Grayer, MD  clopidogrel (PLAVIX) 75 MG  tablet Take 1 tablet (75 mg total) by mouth daily. 06/03/19   [name], MD  diphenhydrAMINE (DIPHENHIST) 25 mg capsule Take 25 mg by mouth at bedtime as needed.     [provider]  escitalopram (LEXAPRO) 10 MG tablet Take 1 tablet (10 mg total) by mouth daily. 06/03/19   [name], MD  ferrous sulfate 325 (65 FE) MG EC tablet Take 325 mg by mouth daily with breakfast.    [provider]  Fluticasone-Umeclidin-Vilant (TRELEGY ELLIPTA) 100-62.5-25 MCG/INH AEPB Inhale 1 puff into the lungs daily.    [provider]  furosemide (LASIX) 40 MG tablet One tablet twice a day 07/19/19   Loletha Grayer, MD  omeprazole (PRILOSEC) 40 MG capsule Take 1 capsule (40 mg total) by mouth daily. 06/03/19   [name], MD  potassium chloride (K-DUR) 10 MEQ tablet Take 1 tablet (10 mEq total) by mouth daily. 06/03/19   [name], MD  potassium chloride SA (KLOR-CON M20) 20 MEQ tablet Take 1 tablet (20 mEq total) by mouth daily. 08/03/19   Hinda Kehr, MD  sacubitril-valsartan (ENTRESTO) 24-26 MG Take 1 tablet by mouth 2 (two) times daily. 07/19/19   Loletha Grayer, MD  spironolactone (ALDACTONE) 25 MG tablet Take 0.5 tablets (12.5 mg total) by mouth daily. 07/28/19 07/27/20  [name], MD  traZODone (DESYREL) 50 MG tablet Take 0.5-1 tablets (25-50 mg total) by mouth daily. One hour before bedtime 06/03/19   [name], MD   Allergies  Allergen Reactions   Hydrocodone-Acetaminophen Nausea Only    FAMILY HISTORY:  family history includes Arthritis in her maternal grandmother and mother; Cancer in her mother and sister; Diabetes in her father, maternal grandmother, and mother; Heart disease in her brother, father, mother, and paternal grandfather; Hyperlipidemia in her father and mother; Hypertension in her father, maternal grandfather, maternal grandmother, mother, paternal grandfather, and paternal grandmother; Kidney disease in her brother. SOCIAL HISTORY:   reports that she quit smoking about 7 weeks ago. Her smoking use included cigarettes. She smoked 0.25 packs per day. She has never used smokeless tobacco. She reports current alcohol use of about 2.0 standard drinks of alcohol per week. She reports that she does not use drugs.  Review of Systems:  Gen:  Denies  fever, sweats, chills, weight loss  HEENT: Denies blurred vision, double vision, ear pain, eye pain, hearing loss, nose bleeds, sore throat Cardiac: Endorses sharp intermittent substernal chest pain. No dizziness, or heaviness, chest tightness,edema, No JVD Resp:   No cough, -sputum production, -shortness of breath,-wheezing, -hemoptysis,  Gi: Endorses nausea in ED. Denies swallowing difficulty, stomach pain, or vomiting, diarrhea, constipation, bowel incontinence Gu:  Denies bladder incontinence, burning urine Ext:   Denies Joint pain, stiffness or swelling Skin: Denies  skin rash, easy bruising or bleeding or hives Endoc:  Denies polyuria, polydipsia , polyphagia or weight change Psych:   Denies depression, insomnia or hallucinations  Other:  All other systems negative   VITAL SIGNS: Temp:  [97.4 F (36.3 C)-97.5 F (36.4 C)] 97.5 F (36.4 C) (10/06 1035) Pulse Rate:  [112-128] 112 (10/06 1300) Resp:  [21-52] 21 (10/06 1300) BP: (118-178)/(72-125) 118/72 (10/06 1300) SpO2:  [92 %-98 %] 92 % (10/06 1300) FiO2 (%):  [30 %] 30 % (10/06  1035) Weight:  [72.8 kg-74.3 kg] 72.8 kg (10/06 1035)   No intake/output data recorded. Total I/O In: -  Out: 125 [Urine:125]   SpO2: 92 % FiO2 (%): 30 %   Physical Examination:  GENERAL:critically ill appearing, on BiPAP HEAD: Normocephalic, atraumatic.  EYES: Pupils equal, round, reactive to light.  No scleral icterus.  MOUTH: Moist mucosal membrane. NECK: Supple. No JVD.  PULMONARY: lungs clear to auscultation CARDIOVASCULAR: S1 and S2. Regular rate and rhythm. No murmurs, rubs, or gallops.  GASTROINTESTINAL: TTP in LUQ. Soft,  -distended. No masses. Positive bowel sounds. No hepatosplenomegaly.  MUSCULOSKELETAL: No swelling, clubbing, or edema.  NEUROLOGIC: A&Ox4, no focal deficits SKIN:intact,warm,dry  I personally reviewed lab work that was obtained in last 24 hrs. CXR Independently reviewed  MEDICATIONS: I have reviewed all medications and confirmed regimen as documented  .kkcbc CBC    Component Value Date/Time   WBC 13.2 (H) 08/12/2019 0752   RBC 4.16 08/12/2019 0752   HGB 13.3 08/12/2019 0752   HGB 12.8 08/20/2014 0449   HCT 40.6 08/12/2019 0752   HCT 37.3 08/20/2014 0449   PLT 425 (H) 08/12/2019 0752   PLT 278 08/20/2014 0449   MCV 97.6 08/12/2019 0752   MCV 93 08/20/2014 0449   MCH 32.0 08/12/2019 0752   MCHC 32.8 08/12/2019 0752   RDW 15.4 08/12/2019 0752   RDW 13.5 08/20/2014 0449   LYMPHSABS 2.4 08/12/2019 0752   LYMPHSABS 2.1 08/20/2014 0449   MONOABS 0.8 08/12/2019 0752   MONOABS 1.1 (H) 08/20/2014 0449   EOSABS 0.3 08/12/2019 0752   EOSABS 0.2 08/20/2014 0449   BASOSABS 0.2 (H) 08/12/2019 0752   BASOSABS 0.1 08/20/2014 0449   BMP Latest Ref Rng & Units 08/12/2019 08/02/2019 07/19/2019  Glucose 70 - 99 mg/dL 450(H) 116(H) 133(H)  BUN 8 - 23 mg/dL 16 19 37(H)  Creatinine 0.44 - 1.00 mg/dL 1.30(H) 0.99 1.11(H)  BUN/Creat Ratio 6 - 22 (calc) - - -  Sodium 135 - 145 mmol/L 135 138 139  Potassium 3.5 - 5.1 mmol/L 4.2 2.8(L) 3.8  Chloride 98 - 111 mmol/L 100 101 103  CO2 22 - 32 mmol/L 18(L) 26 25  Calcium 8.9 - 10.3 mg/dL 8.9 8.8(L) 9.4     CULTURE RESULTS   Recent Results (from the past 240 hour(s))  SARS Coronavirus 2 [facility] order, Performed in [facility] hospital lab) Nasopharyngeal Nasopharyngeal Swab     Status: None   Collection Time: 08/12/19  7:52 AM   Specimen: Nasopharyngeal Swab  Result Value Ref Range Status   SARS Coronavirus 2 NEGATIVE NEGATIVE Final    Comment: (NOTE) If result is NEGATIVE SARS-CoV-2 target nucleic acids are NOT DETECTED. The SARS-CoV-2  RNA is generally detectable in upper and lower  respiratory specimens during the acute phase of infection. The lowest  concentration of SARS-CoV-2 viral copies this assay can detect is 250  copies / mL. A negative result does not preclude SARS-CoV-2 infection  and should not be used as the sole basis for treatment or other  patient management decisions.  A negative result may occur with  improper specimen collection / handling, submission of specimen other  than nasopharyngeal swab, presence of viral mutation(s) within the  areas targeted by this assay, and inadequate number of viral copies  (<250 copies / mL). A negative result must be combined with clinical  observations, patient history, and epidemiological information. If result is POSITIVE SARS-CoV-2 target nucleic acids are DETECTED. The SARS-CoV-2 RNA is generally detectable in  upper and lower  respiratory specimens dur ing the acute phase of infection.  Positive  results are indicative of active infection with SARS-CoV-2.  Clinical  correlation with patient history and other diagnostic information is  necessary to determine patient infection status.  Positive results do  not rule out bacterial infection or co-infection with other viruses. If result is PRESUMPTIVE POSTIVE SARS-CoV-2 nucleic acids MAY BE PRESENT.   A presumptive positive result was obtained on the submitted specimen  and confirmed on repeat testing.  While 2019 novel coronavirus  (SARS-CoV-2) nucleic acids may be present in the submitted sample  additional confirmatory testing may be necessary for epidemiological  and / or clinical management purposes  to differentiate between  SARS-CoV-2 and other Sarbecovirus currently known to infect humans.  If clinically indicated additional testing with an alternate test  methodology [phone]) is advised. The SARS-CoV-2 RNA is generally  detectable in upper and lower respiratory sp ecimens during the acute  phase of  infection. The expected result is Negative. Fact Sheet for Patients:  StrictlyIdeas.no Fact Sheet for Healthcare Providers: BankingDealers.co.za This test is not yet approved or cleared by the [place] FDA and has been authorized for detection and/or diagnosis of SARS-CoV-2 by FDA under an Emergency Use Authorization (EUA).  This EUA will remain in effect (meaning this test can be used) for the duration of the COVID-19 declaration under Section 564(b)(1) of the Act, 21 U.S.C. section 360bbb-3(b)(1), unless the authorization is terminated or revoked sooner. Performed at [facility], [address] [place], [place] [address]   MRSA PCR Screening     Status: None   Collection Time: 08/12/19 10:40 AM   Specimen: Nasopharyngeal  Result Value Ref Range Status   MRSA by PCR NEGATIVE NEGATIVE Final    Comment:        The GeneXpert MRSA Assay (FDA approved for NASAL specimens only), is one component of a comprehensive MRSA colonization surveillance program. It is not intended to diagnose MRSA infection nor to guide or monitor treatment for MRSA infections. Performed at [facility], [address], [place], [place] [address]           IMAGING    Dg Chest Portable 1 View  Result Date: 08/12/2019 CLINICAL DATA:  Shortness of breath and hypoxia. EXAM: PORTABLE CHEST 1 VIEW COMPARISON:  08/02/2019 FINDINGS: Right-sided pacemaker unchanged. Lungs are adequately inflated demonstrate moderate hazy prominence of the central perihilar vasculature compatible mild to moderate interstitial edema. No effusion. Mild stable cardiomegaly. Remainder of the exam is unchanged. IMPRESSION: Mild stable cardiomegaly with mild to moderate interstitial edema. Infection less likely. Electronically Signed   By: Marin Olp M.D.   On: 08/12/2019 08:18    ASSESSMENT AND PLAN SYNOPSIS 68 yo female with history of smoking, COPD,  systolic CHF (EF 96% with dilated left ventricle and mitral regurgitation), and CAD s/p CABG presents to the ED in acute on chronic systolic cardiac failure with SOB secondary to interstitial edema on CXR and BNP >4,500.   Severe ACUTE Hypoxic Respiratory Failure Continue biPAP as tolerated Wean off as tolerated Oxygen as needed   ACUTE ON CHRONIC SYSTOLIC CARDIAC FAILURE- EF 45% -oxygen as needed -Lasix as tolerated -follow up cardiac enzymes as indicated -follow up cardiology recs  ACUTE KIDNEY INJURY/Renal Failure -follow chem 7 -follow UO -continue Foley Catheter-assess need -Avoid nephrotoxic agents   CARDIAC ICU monitoring  GI GI PROPHYLAXIS as indicated  ENDO - Blood glucose 450, most likely secondary to stress  and steroids - will use ICU hypoglycemic\Hyperglycemia protocol if needed  ELECTROLYTES -follow labs as needed -replace as needed -pharmacy consultation and following  DVT/GI PRX ordered TRANSFUSIONS AS NEEDED MONITOR FSBS ASSESS the need for LABS     Critical Care Time devoted to patient care services described in this note is 45 minutes.   Overall, patient is critically ill, prognosis is guarded.  Patient with Multiorgan failure and at high risk for cardiac arrest and death.   Recommend Further assessment of Mitral valve and MR. Case Discussed with Dr Ubaldo Glassing with [facility] cardiology.  Corrin Parker, M.D.  [facility]  Medical Director [place] Director [facility] Cardio-Pulmonary Department

## 2019-08-12 NOTE — Progress Notes (Signed)
Pt transferred to RM # [id number] in wheelchair at this time. Report was called to Carlyon Shadow, Therapist, sports. VSS prior to transfer.

## 2019-08-12 NOTE — ED Notes (Signed)
On BIPAP, on the way to ICU with RT and RN. Handoff given to Jinny Blossom, RN (ICU).

## 2019-08-12 NOTE — ED Provider Notes (Signed)
[facility] Emergency Department Provider Note   ____________________________________________   First MD Initiated Contact with Patient 08/12/19 [phone]     (approximate)  I have reviewed the triage vital signs and the nursing notes.   HISTORY  Chief Complaint Respiratory Distress    HPI Ashley [name] is a 68 y.o. female with past medical history of CAD s/p CABG, CHF (EF 45%), MR, COPD, CKD who presents to the ED in respiratory distress.  History is limited secondary to patient's respiratory distress.  She states she has developed difficulty breathing overnight along with a dry cough.  She denies any fevers or chest pain, has not noticed any swelling or pain in her legs.  EMS arrived to patient's residence to find her in respiratory distress, tripoding with O2 sats in the mid 80s.  She was subsequently placed on CPAP with improvement in symptoms.  She does not wear oxygen at home.        Past Medical History:  Diagnosis Date  . AICD (automatic cardioverter/defibrillator) present    on right side  . Anemia   . CAD (coronary artery disease)    s/p CABG  . CHF (congestive heart failure) ([place])   . Chronic kidney disease    renal artery stenosis  . Colon cancer ([place])   . Depression   . GERD (gastroesophageal reflux disease)   . Hx of colonic polyps   . Hyperlipidemia   . Hypertension   . Mitral valve disorder    s/p mitral valve repair wth CABG  . Myocardial infarction ([place])   . Peripheral vascular disease ([place])   . Presence of permanent cardiac pacemaker    Pacemaker/ Defibrillator    Patient Active Problem List   Diagnosis Date Noted  . CHF (congestive heart failure) ([place]) 07/16/2019  . Acute respiratory failure with hypoxia ([place]) 06/19/2019  . Cough with hemoptysis 06/09/2019  . Acute on chronic combined systolic and diastolic CHF (congestive heart failure) ([place]) 05/27/2019  . Insomnia 05/07/2019  . Hypokalemia 03/23/2019  .  Hyperglycemia, drug-induced 03/23/2019  . COVID-19 virus not detected 03/23/2019  . Respiratory failure ([place]) 03/16/2019  . AVM (arteriovenous malformation) of small bowel, acquired   . Anemia, iron deficiency 11/10/2018  . Chronic systolic heart failure ([place]) 11/06/2018  . Rectal polyp   . Benign neoplasm of cecum   . Barrett's esophagus without dysplasia   . Stomach irritation   . Chronic diastolic heart failure ([place]) 07/03/2018  . HTN (hypertension) 07/03/2018  . COPD with emphysema ([place]) 06/28/2018  . B12 deficiency anemia 06/28/2018  . Hypotension 05/11/2018  . Prediabetes 05/11/2018  . Personal history of colon cancer   . Benign neoplasm of descending colon   . Polyp of sigmoid colon   . Benign neoplasm of transverse colon   . Diverticulosis of large intestine without diverticulitis   . 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 Renal artery stenosis ([place]) 10/03/2016  . 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 Hospital discharge follow-up 02/09/2015  . Cough in adult 10/21/2014  . Tobacco abuse 10/11/2014  . Tobacco abuse counseling 10/11/2014  . Chronic right hip pain 08/26/2014  . Atherosclerosis of native artery of extremity with intermittent claudication ([place]) 06/18/2013  . Preoperative evaluation to rule out surgical contraindication 06/18/2013  . CAD (coronary artery disease) 06/01/2013  . GERD (gastroesophageal reflux disease) 06/01/2013  . Hypercholesterolemia 06/01/2013  . Tubular adenoma of colon 06/01/2013  . 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 arm surgery     fracture, has plates and screws  . CABG with mitral valve repair    . CARDIAC CATHETERIZATION    . COLON SURGERY     colon cancer  . COLONOSCOPY WITH PROPOFOL N/A 10/09/2016   Procedure: COLONOSCOPY WITH PROPOFOL;  Surgeon: Jonathon Bellows, MD;  Location: [facility] ENDOSCOPY;  Service: Endoscopy;   Laterality: N/A;  . COLONOSCOPY WITH PROPOFOL N/A 07/24/2018   Procedure: COLONOSCOPY WITH PROPOFOL;  Surgeon: Virgel Manifold, MD;  Location: [facility] ENDOSCOPY;  Service: Endoscopy;  Laterality: N/A;  . CORONARY ARTERY BYPASS GRAFT    . ENTEROSCOPY N/A 11/21/2018   Procedure: ENTEROSCOPY-BALLOON;  Surgeon: Jonathon Bellows, MD;  Location: [facility] ENDOSCOPY;  Service: Gastroenterology;  Laterality: N/A;  . ESOPHAGOGASTRODUODENOSCOPY (EGD) WITH PROPOFOL N/A 07/24/2018   Procedure: ESOPHAGOGASTRODUODENOSCOPY (EGD) WITH PROPOFOL;  Surgeon: Virgel Manifold, MD;  Location: [facility] ENDOSCOPY;  Service: Endoscopy;  Laterality: N/A;  . GIVENS CAPSULE STUDY N/A 08/16/2018   Procedure: GIVENS CAPSULE STUDY;  Surgeon: Virgel Manifold, MD;  Location: [facility] ENDOSCOPY;  Service: Endoscopy;  Laterality: N/A;  . pace maker defib  2016  . PERIPHERAL VASCULAR CATHETERIZATION N/A 10/23/2016   Procedure: Renal Angiography;  Surgeon: Algernon Huxley, MD;  Location: [place] CV LAB;  Service: Cardiovascular;  Laterality: N/A;  . TOTAL ABDOMINAL HYSTERECTOMY  1999   history of abnormal pap    Prior to Admission medications   Medication Sig Start Date End Date Taking? Authorizing Provider  acetaminophen (TYLENOL) 500 MG tablet Maximum 6 tablets a day. Patient taking differently: Take 500 mg by mouth every 4 (four) hours as needed for fever. Maximum 6 tablets a day. 02/09/15   Rubbie Battiest, RN  albuterol (VENTOLIN HFA) 108 (90 Base) MCG/ACT inhaler INHALE 2 PUFFS BY MOUTH INTO THE LUNGS EVERY 6 HOURS AS NEEDED FOR WHEEZING 06/03/19   [name], MD  ALPRAZolam Duanne Moron) 0.5 MG tablet Take 0.5-1 tablets (0.25-0.5 mg total) by mouth at bedtime. TAKE ONE TABLET BY MOUTH AT BEDTIME AS NEEDED FOR ANXIETY 02/24/19   [name], MD  aspirin 81 MG tablet Take 81 mg by mouth daily.    [provider]  atorvastatin (LIPITOR) 40 MG tablet TAKE ONE TABLET BY MOUTH ONCE DAILY 07/25/19   [name], MD   carvedilol (COREG) 3.125 MG tablet Take 1 tablet (3.125 mg total) by mouth 2 (two) times daily with a meal. 07/19/19   Loletha Grayer, MD  clopidogrel (PLAVIX) 75 MG tablet Take 1 tablet (75 mg total) by mouth daily. 06/03/19   [name], MD  cyanocobalamin (,VITAMIN B-12,) 1000 MCG/ML injection Inject 1 mL weekly into the muscle monthly 11/08/18   [name], MD  diphenhydrAMINE (DIPHENHIST) 25 mg capsule Take 25 mg by mouth at bedtime as needed.     [provider]  escitalopram (LEXAPRO) 10 MG tablet Take 1 tablet (10 mg total) by mouth daily. 06/03/19   [name], MD  ferrous sulfate 325 (65 FE) MG EC tablet Take 325 mg by mouth daily with breakfast.    [provider]  Fluticasone-Umeclidin-Vilant (TRELEGY ELLIPTA) 100-62.5-25 MCG/INH AEPB Inhale 1 puff into the lungs daily.    [provider]  furosemide (LASIX) 40 MG tablet One tablet twice a day 07/19/19   Loletha Grayer, MD  ipratropium-albuterol (DUONEB) 0.5-2.5 (3) MG/3ML SOLN Take 3 mLs by nebulization every 6 (six) hours as needed. 08/05/18  [name], MD  nitroGLYCERIN (NITROSTAT) 0.4 MG SL tablet Place 1 tablet (0.4 mg total) under the tongue every 5 (five) minutes as needed for chest pain. 06/19/16   [name], MD  omeprazole (PRILOSEC) 40 MG capsule Take 1 capsule (40 mg total) by mouth daily. 06/03/19   [name], MD  potassium chloride (K-DUR) 10 MEQ tablet Take 1 tablet (10 mEq total) by mouth daily. 06/03/19   [name], MD  potassium chloride SA (KLOR-CON M20) 20 MEQ tablet Take 1 tablet (20 mEq total) by mouth daily. 08/03/19   Hinda Kehr, MD  sacubitril-valsartan (ENTRESTO) 24-26 MG Take 1 tablet by mouth 2 (two) times daily. 07/19/19   Loletha Grayer, MD  spironolactone (ALDACTONE) 25 MG tablet Take 0.5 tablets (12.5 mg total) by mouth daily. 07/28/19 07/27/20  [name], MD  traZODone (DESYREL) 50 MG tablet Take 0.5-1 tablets (25-50 mg total) by mouth  daily. One hour before bedtime 06/03/19   [name], MD    Allergies Hydrocodone-acetaminophen  Family History  Problem Relation Age of Onset  . Arthritis Mother   . Cancer Mother        uterus cancer  . Hyperlipidemia Mother   . Hypertension Mother   . Heart disease Mother   . Diabetes Mother   . Hyperlipidemia Father   . Hypertension Father   . Heart disease Father   . Diabetes Father   . Cancer Sister        ovary cancer  . Diabetes Maternal Grandmother   . Hypertension Maternal Grandmother   . Arthritis Maternal Grandmother   . Hypertension Maternal Grandfather   . Hypertension Paternal Grandmother   . Hypertension Paternal Grandfather   . Heart disease Paternal Grandfather   . Heart disease Brother   . Kidney disease Brother   . Breast cancer Neg Hx     Social History Social History   Tobacco Use  . Smoking status: Former Smoker    Packs/day: 0.25    Types: Cigarettes    Quit date: 06/19/2019    Years since quitting: 0.1  . Smokeless tobacco: Never Used  . Tobacco comment: reports smoking 2-3 cigarettes/ day  Substance Use Topics  . Alcohol use: Yes    Alcohol/week: 2.0 standard drinks    Types: 2 Shots of liquor per week    Comment: occasion  . Drug use: No    Review of Systems  Constitutional: No fever/chills Eyes: No visual changes. ENT: No sore throat. Cardiovascular: Denies chest pain. Respiratory: Positive for cough and shortness of breath. Gastrointestinal: No abdominal pain.  No nausea, no vomiting.  No diarrhea.  No constipation. Genitourinary: Negative for dysuria. Musculoskeletal: Negative for back pain. Skin: Negative for rash. Neurological: Negative for headaches, focal weakness or numbness.  ____________________________________________   PHYSICAL EXAM:  VITAL SIGNS: ED Triage Vitals  Enc Vitals Group     BP      Pulse      Resp      Temp      Temp src      SpO2      Weight      Height      Head Circumference       Peak Flow      Pain Score      Pain Loc      Pain Edu?      Excl. in [place]?     Constitutional: Alert and oriented. Eyes: Conjunctivae are normal.  Head: Atraumatic. Nose: No congestion/rhinnorhea. Mouth/Throat: Mucous membranes are moist. Neck: Normal ROM Cardiovascular: Normal rate, regular rhythm. Grossly normal heart sounds. Respiratory: Respiratory distress, tachypneic with accessory muscle use.  Poor air movement with wheezing noted. Gastrointestinal: Soft and nontender. No distention. Genitourinary: deferred Musculoskeletal: No lower extremity tenderness nor edema. Neurologic:  Normal speech and language. No gross focal neurologic deficits are appreciated. Skin:  Skin is warm, dry and intact. No rash noted. Psychiatric: Mood and affect are normal. Speech and behavior are normal.  ____________________________________________   LABS (all labs ordered are listed, but only abnormal results are displayed)  Labs Reviewed  BASIC METABOLIC PANEL - Abnormal; Notable for the following components:      Result Value   CO2 18 (*)    Glucose, Bld 450 (*)    Creatinine, Ser 1.30 (*)    GFR calc non Af Amer 43 (*)    GFR calc Af Amer 50 (*)    Anion gap 17 (*)    All other components within normal limits  CBC WITH DIFFERENTIAL/PLATELET - Abnormal; Notable for the following components:   WBC 13.2 (*)    Platelets 425 (*)    Neutro Abs 9.5 (*)    Basophils Absolute 0.2 (*)    Abs Immature Granulocytes 0.10 (*)    All other components within normal limits  BLOOD GAS, VENOUS - Abnormal; Notable for the following components:   pCO2, Ven 43 (*)    pO2, Ven 67.0 (*)    Acid-base deficit 5.7 (*)    All other components within normal limits  TROPONIN I (HIGH SENSITIVITY) - Abnormal; Notable for the following components:   Troponin I (High Sensitivity) 19 (*)    All other components within normal limits  SARS CORONAVIRUS 2 (HOSPITAL ORDER, [place] [facility])   BETA-HYDROXYBUTYRIC ACID  BRAIN NATRIURETIC PEPTIDE   ____________________________________________  EKG  ED ECG REPORT I, Blake Divine, the attending physician, personally viewed and interpreted this ECG.   Date: 08/12/2019  EKG Time: 7:46  Rate: 129  Rhythm: sinus tachycardia, paced rhythm  Axis: Normal  Intervals:right bundle branch block  ST&T Change: No acute    PROCEDURES  Procedure(s) performed (including Critical Care):  .Critical Care Performed by: Blake Divine, MD Authorized by: Blake Divine, MD   Critical care provider statement:    Critical care time (minutes):  45   Critical care time was exclusive of:  Separately billable procedures and treating other patients and teaching time   Critical care was necessary to treat or prevent imminent or life-threatening deterioration of the following conditions:  Respiratory failure   Critical care was time spent personally by me on the following activities:  Discussions with consultants, evaluation of patient's response to treatment, examination of patient, ordering and performing treatments and interventions, ordering and review of laboratory studies, ordering and review of radiographic studies, pulse oximetry, re-evaluation of patient's condition, obtaining history from patient or surrogate and review of old charts   I assumed direction of critical care for this patient from another provider in my specialty: no       ____________________________________________   INITIAL IMPRESSION / ASSESSMENT AND PLAN / ED COURSE       68 year old female with history of CAD status post CABG, CHF, and COPD presents to the ED in respiratory distress, required CPAP by EMS.  She continues to be in respiratory distress on arrival and was transitioned to BiPAP.  Wheezing with poor air movement noted throughout, patient  given dose of steroids and multiple breathing treatments.  Chest x-ray also concerning for pulmonary edema and  nitro paste was placed.  Concern for COPD versus CHF versus worsening mitral regurgitation.  Patient denies any chest pain and EKG without acute ischemic changes.  Patient showed only mild improvement following treatment of her COPD, chest x-ray consistent with pulmonary edema related to CHF.  Nitro paste was placed and diuresis started with Lasix, patient subsequently breathing more comfortably.  Troponin very mildly elevated, low suspicion for ACS.  She is noted to be hyperglycemic without history of diabetes, however no evidence of DKA.  Case discussed with hospitalist, who accepts patient for admission.      ____________________________________________   FINAL CLINICAL IMPRESSION(S) / ED DIAGNOSES  Final diagnoses:  COPD exacerbation ([place])  Acute respiratory failure with hypoxia ([place])  Acute on chronic systolic congestive heart failure ([place])  Hyperglycemia     ED Discharge Orders    None       Note:  This document was prepared using Dragon voice recognition software and may include unintentional dictation errors.   Blake Divine, MD 08/12/19 [phone]

## 2019-08-12 NOTE — ED Notes (Signed)
Pt vomited a copious amount of clear thick sputum. MD at bedside, Zofran ordered and given. See [facility]

## 2019-08-12 NOTE — Patient Outreach (Signed)
[facility]) Care Management [place] Telephone Outreach Care Coordination  08/12/2019  [name] July 07, 1951 [id number]  [place] Telephone Outreach Care Coordination re:  Ashley [name], 68 y/o femalereferred to [place] by [facility] Liaison RN CM after recent hospitalization July 21-25, 2020 for acute on chronic CHF exacerbation.Patientwas discharged from hospital to home/ self-care without home health services in place. Patient has history including, but not limited to, combined CHF; CAD with previous MI/ CABG, and ICD; HTN/ HLD; GERD; COPD; tobacco use.  Unfortunately, patient experienced(2) recenthospital readmissions: 1)August 13-15, 2020 for acute respiratory failure with hypoxia/18 days after her hospital discharge, ; patient was again discharged home without home health services in place. 2) September 9-12, 2020 for acute pulmonary edema; respiratory failure with hypoxia; and CHF exacerbation/ 25 days after previous hospital discharge; was again discharged home without home health services in place, as patient continually refuses need for home health. 3) ED visit 08/02/2019 for chest pain; patient was also diagnosed with ETOH intoxication and discharged home after being ruled out for ACS/ MI  Unfortunately, patient has experienced hospital readmission this morning, 24 days after her last hospital discharge, again for COPD/ CHF exacerbation.  Sent secure message via EMR to [facility] liaison team, notifying of patient's hospital readmission this morning; also notified [facility] of patient's readmission.  Plan:  Will collaborate with [facility] Liaison team for discharge disposition/ planning  [name], RN, BSN, [place] Care Management  [phone]

## 2019-08-12 NOTE — H&P (Signed)
[place] [facility] [place] NAME: Ashley [name]    MR#:  [id number]  DATE OF BIRTH:  05/27/1951  DATE OF ADMISSION:  08/12/2019  PRIMARY CARE PHYSICIAN: [name], MD   REQUESTING/REFERRING PHYSICIAN: Blake Divine, MD  CHIEF COMPLAINT:   Chief Complaint  Patient presents with  . Respiratory Distress    HISTORY OF PRESENT ILLNESS: Ashley [name]  is a 68 y.o. female with a known history of multiple medical problems including chronic systolic CHF, moderate to severe mitral regurg, COPD, GERD, hypertension, coronary artery disease who is presenting with shortness of breath.  Patient states that her shortness of breath started yesterday.  And has progressively gotten worse.  In the emergency room she had to be placed on BiPAP.  She denies any fevers or chills.  Complains of productive sputum that is clear in color.  PAST MEDICAL HISTORY:   Past Medical History:  Diagnosis Date  . AICD (automatic cardioverter/defibrillator) present    on right side  . Anemia   . CAD (coronary artery disease)    s/p CABG  . CHF (congestive heart failure) ([place])   . Chronic kidney disease    renal artery stenosis  . Colon cancer ([place])   . Depression   . GERD (gastroesophageal reflux disease)   . Hx of colonic polyps   . Hyperlipidemia   . Hypertension   . Mitral valve disorder    s/p mitral valve repair wth CABG  . Myocardial infarction ([place])   . Peripheral vascular disease ([place])   . Presence of permanent cardiac pacemaker    Pacemaker/ Defibrillator    PAST SURGICAL HISTORY:  Past Surgical History:  Procedure Laterality Date  . arm surgery     fracture, has plates and screws  . CABG with mitral valve repair    . CARDIAC CATHETERIZATION    . COLON SURGERY     colon cancer  . COLONOSCOPY WITH PROPOFOL N/A 10/09/2016   Procedure: COLONOSCOPY WITH PROPOFOL;  Surgeon: Jonathon Bellows, MD;  Location: [facility] ENDOSCOPY;  Service: Endoscopy;  Laterality:  N/A;  . COLONOSCOPY WITH PROPOFOL N/A 07/24/2018   Procedure: COLONOSCOPY WITH PROPOFOL;  Surgeon: Virgel Manifold, MD;  Location: [facility] ENDOSCOPY;  Service: Endoscopy;  Laterality: N/A;  . CORONARY ARTERY BYPASS GRAFT    . ENTEROSCOPY N/A 11/21/2018   Procedure: ENTEROSCOPY-BALLOON;  Surgeon: Jonathon Bellows, MD;  Location: [facility] ENDOSCOPY;  Service: Gastroenterology;  Laterality: N/A;  . ESOPHAGOGASTRODUODENOSCOPY (EGD) WITH PROPOFOL N/A 07/24/2018   Procedure: ESOPHAGOGASTRODUODENOSCOPY (EGD) WITH PROPOFOL;  Surgeon: Virgel Manifold, MD;  Location: [facility] ENDOSCOPY;  Service: Endoscopy;  Laterality: N/A;  . GIVENS CAPSULE STUDY N/A 08/16/2018   Procedure: GIVENS CAPSULE STUDY;  Surgeon: Virgel Manifold, MD;  Location: [facility] ENDOSCOPY;  Service: Endoscopy;  Laterality: N/A;  . pace maker defib  2016  . PERIPHERAL VASCULAR CATHETERIZATION N/A 10/23/2016   Procedure: Renal Angiography;  Surgeon: Algernon Huxley, MD;  Location: [facility];  Service: Cardiovascular;  Laterality: N/A;  . TOTAL ABDOMINAL HYSTERECTOMY  1999   history of abnormal pap    SOCIAL HISTORY:  Social History   Tobacco Use  . Smoking status: Former Smoker    Packs/day: 0.25    Types: Cigarettes    Quit date: 06/19/2019    Years since quitting: 0.1  . Smokeless tobacco: Never Used  . Tobacco comment: reports smoking 2-3 cigarettes/ day  Substance Use Topics  . Alcohol use: Yes  Alcohol/week: 2.0 standard drinks    Types: 2 Shots of liquor per week    Comment: occasion    FAMILY HISTORY:  Family History  Problem Relation Age of Onset  . Arthritis Mother   . Cancer Mother        uterus cancer  . Hyperlipidemia Mother   . Hypertension Mother   . Heart disease Mother   . Diabetes Mother   . Hyperlipidemia Father   . Hypertension Father   . Heart disease Father   . Diabetes Father   . Cancer Sister        ovary cancer  . Diabetes Maternal Grandmother   . Hypertension Maternal Grandmother    . Arthritis Maternal Grandmother   . Hypertension Maternal Grandfather   . Hypertension Paternal Grandmother   . Hypertension Paternal Grandfather   . Heart disease Paternal Grandfather   . Heart disease Brother   . Kidney disease Brother   . Breast cancer Neg Hx     DRUG ALLERGIES:  Allergies  Allergen Reactions  . Hydrocodone-Acetaminophen Nausea Only    REVIEW OF SYSTEMS:   CONSTITUTIONAL: No fever, fatigue or weakness.  EYES: No blurred or double vision.  EARS, NOSE, AND THROAT: No tinnitus or ear pain.  RESPIRATORY: Positive cough, positive shortness of breath, wheezing or hemoptysis.  CARDIOVASCULAR: No chest pain, orthopnea, edema.  GASTROINTESTINAL: No nausea, vomiting, diarrhea or abdominal pain.  GENITOURINARY: No dysuria, hematuria.  ENDOCRINE: No polyuria, nocturia,  HEMATOLOGY: No anemia, easy bruising or bleeding SKIN: No rash or lesion. MUSCULOSKELETAL: No joint pain or arthritis.   NEUROLOGIC: No tingling, numbness, weakness.  PSYCHIATRY: No anxiety or depression.   MEDICATIONS AT HOME:  Prior to Admission medications   Medication Sig Start Date End Date Taking? Authorizing Provider  albuterol (VENTOLIN HFA) 108 (90 Base) MCG/ACT inhaler INHALE 2 PUFFS BY MOUTH INTO THE LUNGS EVERY 6 HOURS AS NEEDED FOR WHEEZING Patient taking differently: Inhale 2 puffs into the lungs every 6 (six) hours as needed for wheezing or shortness of breath. INHALE 2 PUFFS BY MOUTH INTO THE LUNGS EVERY 6 HOURS AS NEEDED FOR WHEEZING 06/03/19  Yes [name], MD  cyanocobalamin (,VITAMIN B-12,) 1000 MCG/ML injection Inject 1 mL weekly into the muscle monthly Patient taking differently: Inject 1,000 mcg into the muscle every 30 (thirty) days.  11/08/18  Yes [name], MD  acetaminophen (TYLENOL) 500 MG tablet Maximum 6 tablets a day. Patient taking differently: Take 500 mg by mouth every 4 (four) hours as needed for fever. Maximum 6 tablets a day. 02/09/15   Rubbie Battiest, RN   ALPRAZolam Duanne Moron) 0.5 MG tablet Take 0.5-1 tablets (0.25-0.5 mg total) by mouth at bedtime. TAKE ONE TABLET BY MOUTH AT BEDTIME AS NEEDED FOR ANXIETY 02/24/19   [name], MD  aspirin 81 MG tablet Take 81 mg by mouth daily.    [provider]  atorvastatin (LIPITOR) 40 MG tablet TAKE ONE TABLET BY MOUTH ONCE DAILY 07/25/19   [name], MD  carvedilol (COREG) 3.125 MG tablet Take 1 tablet (3.125 mg total) by mouth 2 (two) times daily with a meal. 07/19/19   Loletha Grayer, MD  clopidogrel (PLAVIX) 75 MG tablet Take 1 tablet (75 mg total) by mouth daily. 06/03/19   [name], MD  diphenhydrAMINE (DIPHENHIST) 25 mg capsule Take 25 mg by mouth at bedtime as needed.     [provider]  escitalopram (LEXAPRO) 10 MG tablet Take 1 tablet (10  mg total) by mouth daily. 06/03/19   [name], MD  ferrous sulfate 325 (65 FE) MG EC tablet Take 325 mg by mouth daily with breakfast.    [provider]  Fluticasone-Umeclidin-Vilant (TRELEGY ELLIPTA) 100-62.5-25 MCG/INH AEPB Inhale 1 puff into the lungs daily.    [provider]  furosemide (LASIX) 40 MG tablet One tablet twice a day 07/19/19   Loletha Grayer, MD  ipratropium-albuterol (DUONEB) 0.5-2.5 (3) MG/3ML SOLN Take 3 mLs by nebulization every 6 (six) hours as needed. 08/05/18   [name], MD  nitroGLYCERIN (NITROSTAT) 0.4 MG SL tablet Place 1 tablet (0.4 mg total) under the tongue every 5 (five) minutes as needed for chest pain. 06/19/16   [name], MD  omeprazole (PRILOSEC) 40 MG capsule Take 1 capsule (40 mg total) by mouth daily. 06/03/19   [name], MD  potassium chloride (K-DUR) 10 MEQ tablet Take 1 tablet (10 mEq total) by mouth daily. 06/03/19   [name], MD  potassium chloride SA (KLOR-CON M20) 20 MEQ tablet Take 1 tablet (20 mEq total) by mouth daily. 08/03/19   Hinda Kehr, MD  sacubitril-valsartan (ENTRESTO) 24-26 MG Take 1 tablet by mouth 2 (two) times daily.  07/19/19   Loletha Grayer, MD  spironolactone (ALDACTONE) 25 MG tablet Take 0.5 tablets (12.5 mg total) by mouth daily. 07/28/19 07/27/20  [name], MD  traZODone (DESYREL) 50 MG tablet Take 0.5-1 tablets (25-50 mg total) by mouth daily. One hour before bedtime 06/03/19   [name], MD      PHYSICAL EXAMINATION:   VITAL SIGNS: Blood pressure (!) 178/125, pulse (!) 124, resp. rate (!) 31, height 5\' 4"  (1.626 m), weight 74.3 kg, SpO2 94 %.  GENERAL:  68 y.o.-year-old patient lying in the bed on BiPAP EYES: Pupils equal, round, reactive to light and accommodation. No scleral icterus. Extraocular muscles intact.  HEENT: Head atraumatic, normocephalic. Oropharynx and nasopharynx clear.  NECK:  Supple, no jugular venous distention. No thyroid enlargement, no tenderness.  LUNGS: Normal breath sounds bilaterally, no wheezing, rales,rhonchi or crepitation. No use of accessory muscles of respiration.  CARDIOVASCULAR: S1, S2 tachycardic , positive systolic murmur ABDOMEN: Soft, nontender, nondistended. Bowel sounds present. No organomegaly or mass.  EXTREMITIES: No pedal edema, cyanosis, or clubbing.  NEUROLOGIC: Cranial nerves II through XII are intact. Muscle strength 5/5 in all extremities. Sensation intact. Gait not checked.  PSYCHIATRIC: The patient is alert and oriented x 3.  SKIN: No obvious rash, lesion, or ulcer.   LABORATORY PANEL:   CBC Recent Labs  Lab 08/12/19 0752  WBC 13.2*  HGB 13.3  HCT 40.6  PLT 425*  MCV 97.6  MCH 32.0  MCHC 32.8  RDW 15.4  LYMPHSABS 2.4  MONOABS 0.8  EOSABS 0.3  BASOSABS 0.2*   ------------------------------------------------------------------------------------------------------------------  Chemistries  Recent Labs  Lab 08/12/19 0752  NA 135  K 4.2  CL 100  CO2 18*  GLUCOSE 450*  BUN 16  CREATININE 1.30*  CALCIUM 8.9    ------------------------------------------------------------------------------------------------------------------ estimated creatinine clearance is 42 mL/min (A) (by C-G formula based on SCr of 1.3 mg/dL (H)). ------------------------------------------------------------------------------------------------------------------ No results for input(s): TSH, T4TOTAL, T3FREE, THYROIDAB in the last 72 hours.  Invalid input(s): FREET3   Coagulation profile No results for input(s): INR, PROTIME in the last 168 hours. ------------------------------------------------------------------------------------------------------------------- No results for input(s): DDIMER in the last 72 hours. -------------------------------------------------------------------------------------------------------------------  Cardiac Enzymes No results for input(s): CKMB, TROPONINI, MYOGLOBIN in the last 168 hours.  Invalid input(s): CK ------------------------------------------------------------------------------------------------------------------ Invalid input(s): POCBNP  ---------------------------------------------------------------------------------------------------------------  Urinalysis    Component Value Date/Time   COLORURINE YELLOW (A) 05/29/2019 0810   APPEARANCEUR CLEAR (A) 05/29/2019 0810   APPEARANCEUR Clear 08/19/2014 0025   LABSPEC 1.006 05/29/2019 0810   LABSPEC 1.009 08/19/2014 0025   PHURINE 6.0 05/29/2019 0810   GLUCOSEU NEGATIVE 05/29/2019 0810   GLUCOSEU 50 mg/dL 08/19/2014 0025   HGBUR SMALL (A) 05/29/2019 0810   BILIRUBINUR NEGATIVE 05/29/2019 0810   BILIRUBINUR Negative 08/19/2014 0025   KETONESUR NEGATIVE 05/29/2019 0810   PROTEINUR NEGATIVE 05/29/2019 0810   NITRITE NEGATIVE 05/29/2019 0810   LEUKOCYTESUR NEGATIVE 05/29/2019 0810   LEUKOCYTESUR Negative 08/19/2014 0025     RADIOLOGY: Dg Chest Portable 1 View  Result Date: 08/12/2019 CLINICAL DATA:  Shortness of breath and  hypoxia. EXAM: PORTABLE CHEST 1 VIEW COMPARISON:  08/02/2019 FINDINGS: Right-sided pacemaker unchanged. Lungs are adequately inflated demonstrate moderate hazy prominence of the central perihilar vasculature compatible mild to moderate interstitial edema. No effusion. Mild stable cardiomegaly. Remainder of the exam is unchanged. IMPRESSION: Mild stable cardiomegaly with mild to moderate interstitial edema. Infection less likely. Electronically Signed   By: Marin Olp M.D.   On: 08/12/2019 08:18    EKG: Orders placed or performed during the hospital encounter of 08/12/19  . ED EKG  . ED EKG  . EKG 12-Lead  . EKG 12-Lead    IMPRESSION AND PLAN: Patient 68 year old with multiple recent admissions presenting with shortness of breath  1.  Acute respiratory failure suspect due to acute systolic CHF as well as possible progression of her valvular disease, as well as COPD exacerbation We will treat with IV Lasix, continue BiPAP therapy, I have messaged the intensivist regarding admission I will also treat for COPD exasperation Continue Entresto if renal function worsens will discontinue this for now  2.  Moderate to severe mitral regurg patient had echo 2 months ago.  Cardiology to decide if she needs this repeated  3.  Accelerated hypertension we will use IV hydralazine also elevated heart rate I will hold off on IV metoprolol in light of acute CHF exasperation  4.  Acute kidney injury monitor in light of patient's diuresis therapy   5, coronary artery disease continue aspirin Plavix and Coreg   6.  COPD with exacerbation continue nebs I will add Pulmicort also addSolu-Medrol    All the records are reviewed and case discussed with ED provider. Management plans discussed with the patient, family and they are in agreement.  CODE STATUS: Code Status History    Date Active Date Inactive Code Status Order ID Comments User Context   07/16/2019 1508 07/19/2019 1445 Full Code [id number]   Hillary Bow, MD ED   06/19/2019 1430 06/21/2019 1804 Full Code [id number]  Henreitta Leber, MD ED   05/27/2019 0630 05/31/2019 2111 Full Code [id number]  Mansy, Arvella Merles, MD ED   03/16/2019 1354 03/17/2019 1617 Full Code [id number]  Salary, Avel Peace, MD Inpatient   07/17/2018 1214 07/19/2018 1548 Full Code [id number]  Dustin Flock, MD ED   10/23/2016 0917 10/23/2016 1630 Full Code [id number]  Algernon Huxley, MD Inpatient   06/21/2016 0437 06/21/2016 1758 Full Code [id number]  Harrie Foreman, MD Inpatient   Advance Care Planning Activity       TOTAL TIME TAKING CARE OF THIS PATIENT: 55 minutes of critical care time spent   Dustin Flock M.D on 08/12/2019 at 9:20 AM  Between 7am to 6pm - Pager - [phone]  After 6pm go to [email or web address] - password [email or web address]  [employer] [facility] Office  [phone]  CC: Primary care physician; [name], MD

## 2019-08-13 ENCOUNTER — Other Ambulatory Visit: Payer: Medicare Other

## 2019-08-13 ENCOUNTER — Other Ambulatory Visit: Payer: Self-pay

## 2019-08-13 LAB — BASIC METABOLIC PANEL
Anion gap: 14 (ref 5–15)
BUN: 29 mg/dL — ABNORMAL HIGH (ref 8–23)
CO2: 22 mmol/L (ref 22–32)
Calcium: 9.3 mg/dL (ref 8.9–10.3)
Chloride: 96 mmol/L — ABNORMAL LOW (ref 98–111)
Creatinine, Ser: 1.49 mg/dL — ABNORMAL HIGH (ref 0.44–1.00)
GFR calc Af Amer: 42 mL/min — ABNORMAL LOW (ref >60)
GFR calc non Af Amer: 36 mL/min — ABNORMAL LOW (ref >60)
Glucose, Bld: 227 mg/dL — ABNORMAL HIGH (ref 70–99)
Potassium: 4.4 mmol/L (ref 3.5–5.1)
Sodium: 132 mmol/L — ABNORMAL LOW (ref 135–145)

## 2019-08-13 LAB — GLUCOSE, CAPILLARY
Glucose-Capillary: 226 mg/dL — ABNORMAL HIGH (ref 70–99)
Glucose-Capillary: 157 mg/dL — ABNORMAL HIGH (ref 70–99)
Glucose-Capillary: 130 mg/dL — ABNORMAL HIGH (ref 70–99)

## 2019-08-13 LAB — TROPONIN I (HIGH SENSITIVITY)
Troponin I (High Sensitivity): 33 ng/L — ABNORMAL HIGH (ref <18)
Troponin I (High Sensitivity): 36 ng/L — ABNORMAL HIGH (ref <18)

## 2019-08-13 LAB — HEMOGLOBIN A1C
Hgb A1c MFr Bld: 5.4 % (ref 4.8–5.6)
Mean Plasma Glucose: 108.28 mg/dL

## 2019-08-13 MED ORDER — OXYCODONE HCL 5 MG PO TABS
5.0000 mg | ORAL_TABLET | Freq: Four times a day (QID) | ORAL | Status: DC | PRN
  Administered 2019-08-13: 5 mg via ORAL
  Filled 2019-08-13: qty 1

## 2019-08-13 MED ORDER — PREDNISONE 50 MG PO TABS
50.0000 mg | ORAL_TABLET | Freq: Every day | ORAL | Status: DC
  Administered 2019-08-14 – 2019-08-15 (×2): 50 mg via ORAL
  Filled 2019-08-13 (×2): qty 1

## 2019-08-13 MED ORDER — INSULIN ASPART 100 UNIT/ML ~~LOC~~ SOLN
0.0000 [IU] | Freq: Three times a day (TID) | SUBCUTANEOUS | Status: DC
  Administered 2019-08-13: 2 [IU] via SUBCUTANEOUS
  Administered 2019-08-14: 1 [IU] via SUBCUTANEOUS
  Administered 2019-08-14: 2 [IU] via SUBCUTANEOUS
  Administered 2019-08-14: 5 [IU] via SUBCUTANEOUS
  Filled 2019-08-13 (×4): qty 1

## 2019-08-13 MED ORDER — INSULIN ASPART 100 UNIT/ML ~~LOC~~ SOLN
0.0000 [IU] | Freq: Every day | SUBCUTANEOUS | Status: DC
  Administered 2019-08-14: 2 [IU] via SUBCUTANEOUS
  Filled 2019-08-13: qty 1

## 2019-08-13 MED ORDER — METOPROLOL TARTRATE 5 MG/5ML IV SOLN
5.0000 mg | Freq: Once | INTRAVENOUS | Status: AC
  Administered 2019-08-13: 5 mg via INTRAVENOUS
  Filled 2019-08-13: qty 5

## 2019-08-13 NOTE — Progress Notes (Signed)
Patient c/o of mid sternal dull stabbing chest pain. EKG preformed. Vitals in Epic. One SL NTG given with some relief. MD Jannifer Franklin notified. MD to place orders for troponin.  Update: Patient HR 120s. MD Jannifer Franklin notified. No new orders.   Update: Patient c/o of intermittent episodes of dull stabbing chest pain that are keeping her awake. HR in 120's. MD Jannifer Franklin notified. MD to place orders.

## 2019-08-13 NOTE — Progress Notes (Signed)
[facility] at [facility]                                                                                                                                                                                  Patient Demographics   Ashley [name], is a 68 y.o. female, DOB - August 15, 1951, VQQ:[id number]  Admit date - 08/12/2019   Admitting Physician Dustin Flock, MD  Outpatient Primary MD for the patient is [name], MD   LOS - 1  Subjective: Patient breathing better but still some shortness of breath    Review of Systems:   CONSTITUTIONAL: No documented fever. No fatigue, weakness. No weight gain, no weight loss.  EYES: No blurry or double vision.  ENT: No tinnitus. No postnasal drip. No redness of the oropharynx.  RESPIRATORY: No cough, no wheeze, no hemoptysis.  Positive dyspnea.  CARDIOVASCULAR: No chest pain. No orthopnea. No palpitations. No syncope.  GASTROINTESTINAL: No nausea, no vomiting or diarrhea. No abdominal pain. No melena or hematochezia.  GENITOURINARY: No dysuria or hematuria.  ENDOCRINE: No polyuria or nocturia. No heat or cold intolerance.  HEMATOLOGY: No anemia. No bruising. No bleeding.  INTEGUMENTARY: No rashes. No lesions.  MUSCULOSKELETAL: No arthritis. No swelling. No gout.  NEUROLOGIC: No numbness, tingling, or ataxia. No seizure-type activity.  PSYCHIATRIC: No anxiety. No insomnia. No ADD.    Vitals:   Vitals:   08/13/19 0112 08/13/19 0303 08/13/19 0734 08/13/19 0750  BP: (!) 142/100 119/74  110/75  Pulse: (!) 117 (!) 108  85  Resp: 20 20  18   Temp: 98 F (36.7 C) 98.2 F (36.8 C)  98.1 F (36.7 C)  TempSrc:  Oral  Oral  SpO2: 94% 98% 92% 97%  Weight:  73.7 kg    Height:        Wt Readings from Last 3 Encounters:  08/13/19 73.7 kg  08/02/19 70.8 kg  07/24/19 70.8 kg     Intake/Output Summary (Last 24 hours) at 08/13/2019 1524 Last data filed at 08/13/2019 1339 Gross per 24 hour  Intake 760 ml  Output  1550 ml  Net -790 ml    Physical Exam:   GENERAL: Pleasant-appearing in no apparent distress.  HEAD, EYES, EARS, NOSE AND THROAT: Atraumatic, normocephalic. Extraocular muscles are intact. Pupils equal and reactive to light. Sclerae anicteric. No conjunctival injection. No oro-pharyngeal erythema.  NECK: Supple. There is no jugular venous distention. No bruits, no lymphadenopathy, no thyromegaly.  HEART: Regular rate and rhythm,. No murmurs, no rubs, no clicks.  LUNGS: Positive occasional wheezing ABDOMEN: Soft, flat, nontender, nondistended. Has good bowel sounds. No hepatosplenomegaly appreciated.  EXTREMITIES: No evidence of any cyanosis, clubbing,  or peripheral edema.  +2 pedal and radial pulses bilaterally.  NEUROLOGIC: The patient is alert, awake, and oriented x3 with no focal motor or sensory deficits appreciated bilaterally.  SKIN: Moist and warm with no rashes appreciated.  Psych: Not anxious, depressed LN: No inguinal LN enlargement    Antibiotics   Anti-infectives (From admission, onward)   None      Medications   Scheduled Meds: . aspirin EC  81 mg Oral Daily  . atorvastatin  40 mg Oral Daily  . budesonide (PULMICORT) nebulizer solution  0.25 mg Nebulization BID  . carvedilol  3.125 mg Oral BID WC  . Chlorhexidine Gluconate Cloth  6 each Topical Daily  . clopidogrel  75 mg Oral Daily  . cyanocobalamin  1,000 mcg Intramuscular Q30 days  . enoxaparin (LOVENOX) injection  40 mg Subcutaneous Q24H  . escitalopram  10 mg Oral Daily  . ferrous sulfate  325 mg Oral Q breakfast  . insulin aspart  0-5 Units Subcutaneous QHS  . insulin aspart  0-9 Units Subcutaneous TID WC  . pantoprazole  40 mg Oral Daily  . sacubitril-valsartan  1 tablet Oral BID  . sodium chloride flush  3 mL Intravenous Q12H  . spironolactone  12.5 mg Oral Daily  . traZODone  25-50 mg Oral Daily   Continuous Infusions: . sodium chloride    . sodium chloride     PRN Meds:.sodium chloride,  acetaminophen, ALPRAZolam, hydrALAZINE, levalbuterol, nitroGLYCERIN, ondansetron (ZOFRAN) IV, oxyCODONE, sodium chloride flush   Data Review:   Micro Results Recent Results (from the past 240 hour(s))  SARS Coronavirus 2 [facility] order, Performed in [facility] hospital lab) Nasopharyngeal Nasopharyngeal Swab     Status: None   Collection Time: 08/12/19  7:52 AM   Specimen: Nasopharyngeal Swab  Result Value Ref Range Status   SARS Coronavirus 2 NEGATIVE NEGATIVE Final    Comment: (NOTE) If result is NEGATIVE SARS-CoV-2 target nucleic acids are NOT DETECTED. The SARS-CoV-2 RNA is generally detectable in upper and lower  respiratory specimens during the acute phase of infection. The lowest  concentration of SARS-CoV-2 viral copies this assay can detect is 250  copies / mL. A negative result does not preclude SARS-CoV-2 infection  and should not be used as the sole basis for treatment or other  patient management decisions.  A negative result may occur with  improper specimen collection / handling, submission of specimen other  than nasopharyngeal swab, presence of viral mutation(s) within the  areas targeted by this assay, and inadequate number of viral copies  (<250 copies / mL). A negative result must be combined with clinical  observations, patient history, and epidemiological information. If result is POSITIVE SARS-CoV-2 target nucleic acids are DETECTED. The SARS-CoV-2 RNA is generally detectable in upper and lower  respiratory specimens dur ing the acute phase of infection.  Positive  results are indicative of active infection with SARS-CoV-2.  Clinical  correlation with patient history and other diagnostic information is  necessary to determine patient infection status.  Positive results do  not rule out bacterial infection or co-infection with other viruses. If result is PRESUMPTIVE POSTIVE SARS-CoV-2 nucleic acids MAY BE PRESENT.   A presumptive positive result was  obtained on the submitted specimen  and confirmed on repeat testing.  While 2019 novel coronavirus  (SARS-CoV-2) nucleic acids may be present in the submitted sample  additional confirmatory testing may be necessary for epidemiological  and / or clinical management purposes  to differentiate between  SARS-CoV-2  and other Sarbecovirus currently known to infect humans.  If clinically indicated additional testing with an alternate test  methodology [phone]) is advised. The SARS-CoV-2 RNA is generally  detectable in upper and lower respiratory sp ecimens during the acute  phase of infection. The expected result is Negative. Fact Sheet for Patients:  StrictlyIdeas.no Fact Sheet for Healthcare Providers: BankingDealers.co.za This test is not yet approved or cleared by the [place] FDA and has been authorized for detection and/or diagnosis of SARS-CoV-2 by FDA under an Emergency Use Authorization (EUA).  This EUA will remain in effect (meaning this test can be used) for the duration of the COVID-19 declaration under Section 564(b)(1) of the Act, 21 U.S.C. section 360bbb-3(b)(1), unless the authorization is terminated or revoked sooner. Performed at [facility], [address] [place], [place] [address]   MRSA PCR Screening     Status: None   Collection Time: 08/12/19 10:40 AM   Specimen: Nasopharyngeal  Result Value Ref Range Status   MRSA by PCR NEGATIVE NEGATIVE Final    Comment:        The GeneXpert MRSA Assay (FDA approved for NASAL specimens only), is one component of a comprehensive MRSA colonization surveillance program. It is not intended to diagnose MRSA infection nor to guide or monitor treatment for MRSA infections. Performed at [facility], [address], [place], [place] [address]     Radiology Reports Dg Chest 2 View  Result Date: 08/02/2019 CLINICAL DATA:  Chest pain EXAM: CHEST - 2 VIEW  COMPARISON:  07/16/2019 FINDINGS: Surgical plate and fixating screws in the left clavicle. Post sternotomy changes. Right-sided pacing device with multiple leads. Mild cardiomegaly. Coarse interstitial opacity, likely chronic change. No acute consolidation or pleural effusion. IMPRESSION: No active cardiopulmonary disease.  Mild cardiomegaly. Electronically Signed   By: Donavan Foil M.D.   On: 08/02/2019 23:33   Dg Chest Portable 1 View  Result Date: 08/12/2019 CLINICAL DATA:  Shortness of breath and hypoxia. EXAM: PORTABLE CHEST 1 VIEW COMPARISON:  08/02/2019 FINDINGS: Right-sided pacemaker unchanged. Lungs are adequately inflated demonstrate moderate hazy prominence of the central perihilar vasculature compatible mild to moderate interstitial edema. No effusion. Mild stable cardiomegaly. Remainder of the exam is unchanged. IMPRESSION: Mild stable cardiomegaly with mild to moderate interstitial edema. Infection less likely. Electronically Signed   By: Marin Olp M.D.   On: 08/12/2019 08:18   Dg Chest Port 1 View  Result Date: 07/16/2019 CLINICAL DATA:  Shortness of breath. Hypoxia. EXAM: PORTABLE CHEST 1 VIEW COMPARISON:  06/21/2019 FINDINGS: Patient is new extensive bilateral pulmonary edema. Chronic cardiomegaly. AICD in place. CABG. No acute bone abnormality. No discrete effusions. Aortic atherosclerosis. IMPRESSION: New extensive bilateral pulmonary edema. Aortic Atherosclerosis (ICD10-I70.0). Electronically Signed   By: Lorriane Shire M.D.   On: 07/16/2019 12:52     CBC Recent Labs  Lab 08/12/19 0752  WBC 13.2*  HGB 13.3  HCT 40.6  PLT 425*  MCV 97.6  MCH 32.0  MCHC 32.8  RDW 15.4  LYMPHSABS 2.4  MONOABS 0.8  EOSABS 0.3  BASOSABS 0.2*    Chemistries  Recent Labs  Lab 08/12/19 0752 08/13/19 0141  NA 135 132*  K 4.2 4.4  CL 100 96*  CO2 18* 22  GLUCOSE 450* 227*  BUN 16 29*  CREATININE 1.30* 1.49*  CALCIUM 8.9 9.3    ------------------------------------------------------------------------------------------------------------------ estimated creatinine clearance is 36.5 mL/min (A) (by C-G formula based on SCr of 1.49 mg/dL (H)). ------------------------------------------------------------------------------------------------------------------ No results for input(s): HGBA1C  in the last 72 hours. ------------------------------------------------------------------------------------------------------------------ No results for input(s): CHOL, HDL, LDLCALC, TRIG, CHOLHDL, LDLDIRECT in the last 72 hours. ------------------------------------------------------------------------------------------------------------------ No results for input(s): TSH, T4TOTAL, T3FREE, THYROIDAB in the last 72 hours.  Invalid input(s): FREET3 ------------------------------------------------------------------------------------------------------------------ No results for input(s): VITAMINB12, FOLATE, FERRITIN, TIBC, IRON, RETICCTPCT in the last 72 hours.  Coagulation profile No results for input(s): INR, PROTIME in the last 168 hours.  No results for input(s): DDIMER in the last 72 hours.  Cardiac Enzymes No results for input(s): CKMB, TROPONINI, MYOGLOBIN in the last 168 hours.  Invalid input(s): CK ------------------------------------------------------------------------------------------------------------------ Invalid input(s): [place]   Patient 68 year old with multiple recent admissions presenting with shortness of breath  1.  Acute respiratory failure suspect due to acute systolic CHF as well as possible progression of her valvular disease, as well as COPD exacerbation No significant output since yesterday I will discontinue IV Lasix creatinine has bumped up hold diuresis for now Continue to treat COPD with nebs steroids   2.  Moderate to severe mitral regurg patient had echo 2 months ago.     Cardiology has decided not to repeat echo  3.  Accelerated hypertension  Blood pressure is now improved  4.  Acute kidney injury creatinine worse today hold diuresis If worsens tomorrow asked nephrology to see  5, coronary artery disease continue aspirin Plavix and Coreg   6.  COPD with exacerbation continue nebs, Pulmicort and steroids      Code Status Orders  (From admission, onward)         Start     Ordered   08/12/19 0954  Full code  Continuous     08/12/19 0953        Code Status History    Date Active Date Inactive Code Status Order ID Comments User Context   07/16/2019 1508 07/19/2019 1445 Full Code [id number]  Hillary Bow, MD ED   06/19/2019 1430 06/21/2019 1804 Full Code [id number]  Henreitta Leber, MD ED   05/27/2019 0630 05/31/2019 2111 Full Code [id number]  Mansy, Arvella Merles, MD ED   03/16/2019 1354 03/17/2019 1617 Full Code [id number]  Salary, Avel Peace, MD Inpatient   07/17/2018 1214 07/19/2018 1548 Full Code [id number]  Dustin Flock, MD ED   10/23/2016 0917 10/23/2016 1630 Full Code [id number]  Algernon Huxley, MD Inpatient   06/21/2016 0437 06/21/2016 1758 Full Code [id number]  Harrie Foreman, MD Inpatient   Advance Care Planning Activity           Consults cards, pulm   DVT Prophylaxis  Lovenox   Lab Results  Component Value Date   PLT 425 (H) 08/12/2019     Time Spent in minutes   63min Greater than 50% of time spent in care coordination and counseling patient regarding the condition and plan of care.   Dustin Flock M.D on 08/13/2019 at 3:24 PM  Between 7am to 6pm - Pager - [phone]  After 6pm go to [email or web address] - Proofreader  [employer]   Office  [phone]

## 2019-08-13 NOTE — Progress Notes (Signed)
Advanced care plan.  Purpose of the Encounter: CODE STATUS  Parties in Attendance: pt her self  Patient's Decision Capacity:intact  Subjective/Patient's story: Ashley [name]  is a 68 y.o. female with a known history of multiple medical problems including chronic systolic CHF, moderate to severe mitral regurg, COPD, GERD, hypertension, coronary artery disease who is presenting with shortness of breath.    Objective/Medical story I discussed with regarding her desires for cpr  And intubation.    Goals of care determination:  Pt states she would like everything done and wants to be resuscitated and intubated   CODE STATUS:  full  Time spent discussing advanced care planning: 16 minutes

## 2019-08-13 NOTE — TOC Initial Note (Signed)
Transition of Care [facility]) - Initial/Assessment Note    Patient Details  Name: Ashley [name] MRN: [id number] Date of Birth: [birth year]  Transition of Care [facility]) CM/SW Contact:    Elza Rafter, RN Phone Number: 08/13/2019, 10:06 AM  Clinical Narrative:    Patient is from home with significant other.  Admitted with acute on chronic CHF.  BNP>4500.  Receiving 40mg  IV lasix BID.   On room air.  Independent at home.  Current with the [facility].  Order placed for [facility] referral for multiple admissions.  Current with PCP-[name]; obtains medications at [facility] without difficulty.  Offered home health services-patient declines.  She states she has a functioning scale at home and weighs daily.  No further needs identified at this time by CM team.                Expected Discharge Plan: Home/Self Care Barriers to Discharge: Continued Medical Work up   Patient Goals and CMS Choice Patient states their goals for this hospitalization and ongoing recovery are:: go home      Expected Discharge Plan and Services Expected Discharge Plan: Home/Self Care In-house [facility]erral: [facility] Discharge Planning Services: CM Consult, HF Clinic                                          Prior Living Arrangements/Services   Lives with:: Significant Other                   Activities of Daily Living Home Assistive Devices/Equipment: Cane (specify quad or straight), Walker (specify type) ADL Screening (condition at time of admission) Patient's cognitive ability adequate to safely complete daily activities?: Yes Is the patient deaf or have difficulty hearing?: No Does the patient have difficulty seeing, even when wearing glasses/contacts?: No Does the patient have difficulty concentrating, remembering, or making decisions?: No Patient able to express need for assistance with ADLs?: Yes Does the patient have difficulty dressing or bathing?: No Independently performs ADLs?: Yes  (appropriate for developmental age) Does the patient have difficulty walking or climbing stairs?: No Weakness of Legs: None Weakness of Arms/Hands: None  Permission Sought/Granted                  Emotional Assessment Appearance:: Appears stated age Attitude/Demeanor/Rapport: Gracious Affect (typically observed): Accepting Orientation: : Oriented to Self, Oriented to Place, Oriented to  Time, Oriented to Situation Alcohol / Substance Use: Not Applicable Psych Involvement: No (comment)  Admission diagnosis:  Hyperglycemia [R73.9] COPD exacerbation (HCC) [J44.1] Acute on chronic systolic congestive heart failure (HCC) [I50.23] Acute respiratory failure with hypoxia (HCC) [J96.01] Patient Active Problem List   Diagnosis Date Noted  . Acute CHF ([place]) 08/12/2019  . CHF (congestive heart failure) ([place]) 07/16/2019  . Acute respiratory failure with hypoxia ([place]) 06/19/2019  . Cough with hemoptysis 06/09/2019  . Acute on chronic combined systolic and diastolic CHF (congestive heart failure) ([place]) 05/27/2019  . Insomnia 05/07/2019  . Hypokalemia 03/23/2019  . Hyperglycemia, drug-induced 03/23/2019  . COVID-19 virus not detected 03/23/2019  . Respiratory failure ([place]) 03/16/2019  . AVM (arteriovenous malformation) of small bowel, acquired   . Anemia, iron deficiency 11/10/2018  . Chronic systolic heart failure ([place]) 11/06/2018  . Rectal polyp   . Benign neoplasm of cecum   . Barrett's esophagus without dysplasia   . Stomach irritation   . Chronic diastolic  heart failure ([place]) 07/03/2018  . HTN (hypertension) 07/03/2018  . 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 B12 deficiency anemia 06/28/2018  . Hypotension 05/11/2018  . Prediabetes 05/11/2018  . Personal history of colon cancer   . Benign neoplasm of descending colon   . Polyp of sigmoid colon   . Benign neoplasm of transverse colon   . Diverticulosis of large intestine without diverticulitis   . 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 Renal artery stenosis ([place]) 10/03/2016  . 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 Hospital discharge follow-up 02/09/2015  . Cough in adult 10/21/2014  . Tobacco abuse 10/11/2014  . Tobacco abuse counseling 10/11/2014  . Chronic right hip pain 08/26/2014  . Atherosclerosis of native artery of extremity with intermittent claudication ([place]) 06/18/2013  . Preoperative evaluation to rule out surgical contraindication 06/18/2013  . CAD (coronary artery disease) 06/01/2013  . GERD (gastroesophageal reflux disease) 06/01/2013  . Hypercholesterolemia 06/01/2013  . Tubular adenoma of colon 06/01/2013  . 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 [address] Phone: [phone] Fax: [phone]  [facility] [place] [address] [place] [address] Phone: [phone] Fax: [phone]  [facility] [place] [address], [address] [place] [address] Phone: [phone] Fax: [phone]     Social Determinants of Health (SDOH) Interventions    Readmission Risk Interventions Readmission Risk Prevention Plan 08/13/2019 07/17/2019 05/30/2019  Transportation Screening Complete Complete Complete  PCP or Specialist Appt within 3-5 Days - - Complete  HRI or Home Care Consult - - Complete  Palliative Care Screening - - Not Applicable  Medication Review (RN Care Manager) Complete  Complete Complete  PCP or Specialist appointment within 3-5 days of discharge Complete Complete -  [place] or Home Care Consult Complete Complete -  SW Recovery Care/Counseling Consult Complete Complete -  Palliative Care Screening Not Applicable Not Applicable -  [place] Not Applicable Not Applicable -  Some recent data might be hidden

## 2019-08-13 NOTE — Plan of Care (Signed)
  Problem: Education: Goal: Knowledge of General Education information will improve Description: Including pain rating scale, medication(s)/side effects and non-pharmacologic comfort measures Outcome: Progressing   Problem: Health Behavior/Discharge Planning: Goal: Ability to manage health-related needs will improve Outcome: Progressing   Problem: Clinical Measurements: Goal: Ability to maintain clinical measurements within normal limits will improve Outcome: Not Progressing Note: Troponin x 4 have been elevated. Cardiology following. IV Lasix discontinued today. Will continue to monitor overall progression. Patient follows at the CHF clinic. Wenda Low [facility]

## 2019-08-13 NOTE — Consult Note (Signed)
PHARMACY CONSULT NOTE - FOLLOW UP  Pharmacy Consult for Electrolyte Monitoring and Replacement   Recent Labs: Potassium (mmol/L)  Date Value  08/13/2019 4.4  10/27/2014 4.0   Magnesium (mg/dL)  Date Value  08/02/2019 1.7  02/25/2013 2.0   Calcium (mg/dL)  Date Value  08/13/2019 9.3   Calcium, Total (mg/dL)  Date Value  10/27/2014 9.0   Albumin (g/dL)  Date Value  08/02/2019 3.6  08/18/2014 3.3 (L)   Phosphorus (mg/dL)  Date Value  02/25/2013 3.8   Sodium (mmol/L)  Date Value  08/13/2019 132 (L)  10/27/2014 141     Assessment: 40 YOF admitted with respiratory distress.  Currently diuresing with lasix 40 mg IV BID.  Pharmacy has been consulted to monitor electrolytes.  Plan:  Electrolytes currently wnl.  Will monitor with AM labs  Gerald Dexter, PharmD Pharmacy Resident  08/13/2019 9:23 AM

## 2019-08-13 NOTE — Progress Notes (Signed)
Patient Name: Ashley [name] Date of Encounter: 08/13/2019  Hospital Problem List     Active Problems:   Acute CHF [facility])    Patient Profile     68 y.o. female with history of coronary artery disease status post non-ST elevation myocardial infarction in 2007 status post coronary bypass grafting x3 with mitral valve repair including a left internal mammary to the LAD, saphenous vein graft to OM1 and PDA in January 2007.  Echocardiogram done in July 21 of this year was read as showing ejection fraction of 40 to 45% with moderate asymmetric left ventricular hypertrophy.  There is RV enlargement.  There is moderate to severe mitral stenosis with a mean gradient of 15 mmHg which was also seen associated with moderate to severe mitral regurgitation.  She had a biventricular ICD placed in March 2017 for primary prevention. Admittted with evidence of probable volume overload and copd flare. Has imiproved significantly   Subjective   Feels almost back to normal   Inpatient Medications    . aspirin EC  81 mg Oral Daily  . atorvastatin  40 mg Oral Daily  . budesonide (PULMICORT) nebulizer solution  0.25 mg Nebulization BID  . carvedilol  3.125 mg Oral BID WC  . Chlorhexidine Gluconate Cloth  6 each Topical Daily  . clopidogrel  75 mg Oral Daily  . cyanocobalamin  1,000 mcg Intramuscular Q30 days  . enoxaparin (LOVENOX) injection  40 mg Subcutaneous Q24H  . escitalopram  10 mg Oral Daily  . ferrous sulfate  325 mg Oral Q breakfast  . furosemide  40 mg Intravenous BID  . pantoprazole  40 mg Oral Daily  . sacubitril-valsartan  1 tablet Oral BID  . sodium chloride flush  3 mL Intravenous Q12H  . spironolactone  12.5 mg Oral Daily  . traZODone  25-50 mg Oral Daily    Vital Signs    Vitals:   08/13/19 0112 08/13/19 0303 08/13/19 0734 08/13/19 0750  BP: (!) 142/100 119/74  110/75  Pulse: (!) 117 (!) 108  85  Resp: 20 20  18   Temp: 98 F (36.7 C) 98.2 F (36.8 C)  98.1 F (36.7 C)   TempSrc:  Oral  Oral  SpO2: 94% 98% 92% 97%  Weight:  73.7 kg    Height:        Intake/Output Summary (Last 24 hours) at 08/13/2019 0932 Last data filed at 08/13/2019 0130 Gross per 24 hour  Intake 520 ml  Output 825 ml  Net -305 ml   Filed Weights   08/12/19 0758 08/12/19 1035 08/13/19 0303  Weight: 74.3 kg 72.8 kg 73.7 kg    Physical Exam    GEN: Well nourished, well developed, in no acute distress.  HEENT: normal.  Neck: Supple, no JVD, carotid bruits, or masses. Cardiac: RRR, no murmurs, rubs, or gallops. No clubbing, cyanosis, edema.  Radials/DP/PT 2+ and equal bilaterally.  Respiratory:  Respirations regular and unlabored, clear to auscultation bilaterally. GI: Soft, nontender, nondistended, BS + x 4. MS: no deformity or atrophy. Skin: warm and dry, no rash. Neuro:  Strength and sensation are intact. Psych: Normal affect.  Labs    CBC Recent Labs    08/12/19 0752  WBC 13.2*  NEUTROABS 9.5*  HGB 13.3  HCT 40.6  MCV 97.6  PLT 956*   Basic Metabolic Panel Recent Labs    08/12/19 0752 08/13/19 0141  NA 135 132*  K 4.2 4.4  CL 100 96*  CO2 18* 22  GLUCOSE 450* 227*  BUN 16 29*  CREATININE 1.30* 1.49*  CALCIUM 8.9 9.3   Liver Function Tests No results for input(s): AST, ALT, ALKPHOS, BILITOT, PROT, ALBUMIN in the last 72 hours. No results for input(s): LIPASE, AMYLASE in the last 72 hours. Cardiac Enzymes No results for input(s): CKTOTAL, CKMB, CKMBINDEX, TROPONINI in the last 72 hours. BNP Recent Labs    08/12/19 0752  BNP >4,500.0*   D-Dimer No results for input(s): DDIMER in the last 72 hours. Hemoglobin A1C No results for input(s): HGBA1C in the last 72 hours. Fasting Lipid Panel No results for input(s): CHOL, HDL, LDLCALC, TRIG, CHOLHDL, LDLDIRECT in the last 72 hours. Thyroid Function Tests No results for input(s): TSH, T4TOTAL, T3FREE, THYROIDAB in the last 72 hours.  Invalid input(s): FREET3  Telemetry    Sinus tach  ECG     Paced with tachcycardia  Radiology    Dg Chest 2 View  Result Date: 08/02/2019 CLINICAL DATA:  Chest pain EXAM: CHEST - 2 VIEW COMPARISON:  07/16/2019 FINDINGS: Surgical plate and fixating screws in the left clavicle. Post sternotomy changes. Right-sided pacing device with multiple leads. Mild cardiomegaly. Coarse interstitial opacity, likely chronic change. No acute consolidation or pleural effusion. IMPRESSION: No active cardiopulmonary disease.  Mild cardiomegaly. Electronically Signed   By: Donavan Foil M.D.   On: 08/02/2019 23:33   Dg Chest Portable 1 View  Result Date: 08/12/2019 CLINICAL DATA:  Shortness of breath and hypoxia. EXAM: PORTABLE CHEST 1 VIEW COMPARISON:  08/02/2019 FINDINGS: Right-sided pacemaker unchanged. Lungs are adequately inflated demonstrate moderate hazy prominence of the central perihilar vasculature compatible mild to moderate interstitial edema. No effusion. Mild stable cardiomegaly. Remainder of the exam is unchanged. IMPRESSION: Mild stable cardiomegaly with mild to moderate interstitial edema. Infection less likely. Electronically Signed   By: Marin Olp M.D.   On: 08/12/2019 08:18   Dg Chest Port 1 View  Result Date: 07/16/2019 CLINICAL DATA:  Shortness of breath. Hypoxia. EXAM: PORTABLE CHEST 1 VIEW COMPARISON:  06/21/2019 FINDINGS: Patient is new extensive bilateral pulmonary edema. Chronic cardiomegaly. AICD in place. CABG. No acute bone abnormality. No discrete effusions. Aortic atherosclerosis. IMPRESSION: New extensive bilateral pulmonary edema. Aortic Atherosclerosis (ICD10-I70.0). Electronically Signed   By: Lorriane Shire M.D.   On: 07/16/2019 12:52    Assessment & Plan    Mitral valve disease.  Echo 2 months ago revealed an EF of 45 to 50% with moderate to severe mitral stenosis moderate deficiency of severe mitral regurgitation.  Will need to carefully diurese following renal function and consider assessment for re-repair of her mitral valve.   This intervention would be somewhat high risk secondary to her respiratory status, continued tobacco abuse.  Smoking cessation has been discussed and will continue to be discussed.  Close follow-up of her hemodynamics and renal function along with careful diuresis  COPD, agree with current therapy.  Again smoke cessation is critical.  Cardiomyopathy.  EF with biventricular pacing is 40 to 45%.  We will continue to follow.  Would continue with outpatient medications including Entresto, aspirin and Plavix,   Signed, Chrissie Noa A. Nelly Scriven MD 08/13/2019, 9:32 AM  Pager: [phone]

## 2019-08-13 NOTE — Plan of Care (Signed)
  Problem: Clinical Measurements: Goal: Ability to maintain clinical measurements within normal limits will improve Outcome: Progressing Goal: Cardiovascular complication will be avoided Outcome: Progressing   Problem: Activity: Goal: Risk for activity intolerance will decrease Outcome: Progressing   Problem: Pain Managment: Goal: General experience of comfort will improve Outcome: Progressing   Problem: Safety: Goal: Ability to remain free from injury will improve Outcome: Progressing   Problem: Activity: Goal: Capacity to carry out activities will improve Outcome: Progressing   Problem: Cardiac: Goal: Ability to achieve and maintain adequate cardiopulmonary perfusion will improve Outcome: Progressing

## 2019-08-14 ENCOUNTER — Ambulatory Visit: Payer: Medicare Other

## 2019-08-14 LAB — BASIC METABOLIC PANEL
Anion gap: 11 (ref 5–15)
BUN: 41 mg/dL — ABNORMAL HIGH (ref 8–23)
CO2: 25 mmol/L (ref 22–32)
Calcium: 9.4 mg/dL (ref 8.9–10.3)
Chloride: 101 mmol/L (ref 98–111)
Creatinine, Ser: 1.19 mg/dL — ABNORMAL HIGH (ref 0.44–1.00)
GFR calc Af Amer: 55 mL/min — ABNORMAL LOW (ref >60)
GFR calc non Af Amer: 48 mL/min — ABNORMAL LOW (ref >60)
Glucose, Bld: 137 mg/dL — ABNORMAL HIGH (ref 70–99)
Potassium: 3.8 mmol/L (ref 3.5–5.1)
Sodium: 137 mmol/L (ref 135–145)

## 2019-08-14 LAB — CBC
HCT: 32.2 % — ABNORMAL LOW (ref 36.0–46.0)
Hemoglobin: 10.5 g/dL — ABNORMAL LOW (ref 12.0–15.0)
MCH: 31.8 pg (ref 26.0–34.0)
MCHC: 32.6 g/dL (ref 30.0–36.0)
MCV: 97.6 fL (ref 80.0–100.0)
Platelets: 234 10*3/uL (ref 150–400)
RBC: 3.3 MIL/uL — ABNORMAL LOW (ref 3.87–5.11)
RDW: 15.5 % (ref 11.5–15.5)
WBC: 10.9 10*3/uL — ABNORMAL HIGH (ref 4.0–10.5)
nRBC: 0 % (ref 0.0–0.2)

## 2019-08-14 LAB — GLUCOSE, CAPILLARY
Glucose-Capillary: 122 mg/dL — ABNORMAL HIGH (ref 70–99)
Glucose-Capillary: 269 mg/dL — ABNORMAL HIGH (ref 70–99)
Glucose-Capillary: 181 mg/dL — ABNORMAL HIGH (ref 70–99)
Glucose-Capillary: 205 mg/dL — ABNORMAL HIGH (ref 70–99)

## 2019-08-14 MED ORDER — LACTULOSE 10 GM/15ML PO SOLN
30.0000 g | Freq: Two times a day (BID) | ORAL | Status: DC | PRN
  Administered 2019-08-14: 30 g via ORAL
  Filled 2019-08-14: qty 60

## 2019-08-14 MED ORDER — SACUBITRIL-VALSARTAN 24-26 MG PO TABS
1.0000 | ORAL_TABLET | Freq: Two times a day (BID) | ORAL | Status: DC
  Administered 2019-08-14 – 2019-08-15 (×3): 1 via ORAL
  Filled 2019-08-14 (×3): qty 1

## 2019-08-14 MED ORDER — FUROSEMIDE 40 MG PO TABS
40.0000 mg | ORAL_TABLET | Freq: Two times a day (BID) | ORAL | Status: DC
  Administered 2019-08-14 – 2019-08-15 (×2): 40 mg via ORAL
  Filled 2019-08-14 (×2): qty 1

## 2019-08-14 NOTE — Progress Notes (Signed)
[place] [facility] [place] NAME: Ashley [name]    MR#:  [id number]  DATE OF BIRTH:  15-Nov-1950  SUBJECTIVE:   Patient here due to shortness of breath secondary to mild CHF and also COPD.  Still complaining of some exertional dyspnea.  Creatinine improved since yesterday.  No chest pain no other acute events overnight.  REVIEW OF SYSTEMS:    Review of Systems  Constitutional: Negative for chills and fever.  HENT: Negative for congestion and tinnitus.   Eyes: Negative for blurred vision and double vision.  Respiratory: Negative for cough, shortness of breath and wheezing.   Cardiovascular: Negative for chest pain, orthopnea and PND.  Gastrointestinal: Negative for abdominal pain, diarrhea, nausea and vomiting.  Genitourinary: Negative for dysuria and hematuria.  Neurological: Negative for dizziness, sensory change and focal weakness.  All other systems reviewed and are negative.   Nutrition: Carb control/heart Healthy Tolerating Diet: Yes Tolerating PT: Ambulatory  DRUG ALLERGIES:   Allergies  Allergen Reactions  . Hydrocodone-Acetaminophen Nausea Only    VITALS:  Blood pressure (!) 145/93, pulse 77, temperature 98 F (36.7 C), temperature source Oral, resp. rate 18, height 5\' 4"  (1.626 m), weight 73.5 kg, SpO2 96 %.  PHYSICAL EXAMINATION:   Physical Exam  GENERAL:  68 y.o.-year-old patient lying in bed in no acute distress.  EYES: Pupils equal, round, reactive to light and accommodation. No scleral icterus. Extraocular muscles intact.  HEENT: Head atraumatic, normocephalic. Oropharynx and nasopharynx clear.  NECK:  Supple, no jugular venous distention. No thyroid enlargement, no tenderness.  LUNGS: Good a/e b/l, no wheezing, rales, rhonchi. No use of accessory muscles of respiration.  CARDIOVASCULAR: S1, S2 normal. II/VI Diastolic murmur at base, No rubs, or gallops.  ABDOMEN: Soft, nontender, nondistended. Bowel sounds  present. No organomegaly or mass.  EXTREMITIES: No cyanosis, clubbing or edema b/l.    NEUROLOGIC: Cranial nerves II through XII are intact. No focal Motor or sensory deficits b/l. Globally weak.    PSYCHIATRIC: The patient is alert and oriented x 3.  SKIN: No obvious rash, lesion, or ulcer.    LABORATORY PANEL:   CBC Recent Labs  Lab 08/14/19 0440  WBC 10.9*  HGB 10.5*  HCT 32.2*  PLT 234   ------------------------------------------------------------------------------------------------------------------  Chemistries  Recent Labs  Lab 08/14/19 0440  NA 137  K 3.8  CL 101  CO2 25  GLUCOSE 137*  BUN 41*  CREATININE 1.19*  CALCIUM 9.4   ------------------------------------------------------------------------------------------------------------------  Cardiac Enzymes No results for input(s): TROPONINI in the last 168 hours. ------------------------------------------------------------------------------------------------------------------  RADIOLOGY:  No results found.   ASSESSMENT AND PLAN:   68 year old female with past medical history of coronary artery disease, CKD stage III, previous history of AICD, hypertension, hyperlipidemia, history of colon cancer who presented to the hospital due to shortness of breath.  1.  Acute respiratory failure with hypoxia-secondary to CHF and also COPD. - Continue O2 supplementation, patient was on IV diuretics but it was stopped due to increasing creatinine. -We will resume patient's oral Lasix today, continue Entresto.  Continue COPD treatment with underlying prednisone, Pulmicort nebs and DuoNeb's. Wean oxygen as tolerated.  2.  CHF-acute on chronic systolic dysfunction.  Patient's previous echocardiogram showed EF of 45%. - Continue Lasix, Entresto, Coreg.  Hold Aldactone for now.  Creatinine has improved since yesterday therefore diuretics were resumed.  3.  COPD-mild acute exacerbation. -Continue oral prednisone, duo nebs,  Pulmicort nebs. - Assess for home oxygen prior to  discharge.  4.  Diabetes mellitus without complication- cont. SSI.  - BS stable.   5. Depression - cont. Lexapro.   6. Anxiety - cont. PRN Xanax.   Possible d/c home tomorrow.   All the records are reviewed and case discussed with Care Management/Social Worker. Management plans discussed with the patient, family and they are in agreement.  CODE STATUS: Full code  DVT Prophylaxis: Lovenox  TOTAL TIME TAKING CARE OF THIS PATIENT: 30 minutes.   POSSIBLE D/C IN 1-2 DAYS, DEPENDING ON CLINICAL CONDITION.   Henreitta Leber M.D on 08/14/2019 at 3:26 PM  Between 7am to 6pm - Pager - [phone]  After 6pm go to [email or web address] - Proofreader  [facility]  Office  [phone]  CC: Primary care physician; [name], MD

## 2019-08-14 NOTE — Plan of Care (Signed)
Nutrition Education Note  RD consulted for nutrition education regarding recurrent CHF.  68 y/o female admitted with CHF  RD familiar with this patient from recent previous admit. CHF education was provided to this patient from this RD on 05/30/19. Pt reports some improvements regarding her diet. Pt  has made some efforts to try and reduce sodium intake. Pt has also been compliant with weighing herself daily. RD reiterated today the importance of following a low sodium diet. Reminded patient of ways to decrease sodium intake in her diet.   RD provided "Low Sodium Nutrition Therapy" handout from the Academy of [employer]. Reviewed patient's dietary recall. Provided examples on ways to decrease sodium intake in diet. Discouraged intake of processed foods and use of salt shaker. Encouraged fresh fruits and vegetables as well as whole grain sources of carbohydrates to maximize fiber intake.   RD discussed why it is important for patient to adhere to diet recommendations, and emphasized the role of fluids, foods to avoid, and importance of weighing self daily. Teach back method used.  Expect fair compliance.  Body mass index is 27.82 kg/m. Pt meets criteria for overweight based on current BMI.  Current diet order is 2 gram sodium diet, patient is consuming approximately 100% of meals at this time. Labs and medications reviewed. No further nutrition interventions warranted at this time. RD contact information provided. If additional nutrition issues arise, please re-consult RD.   Koleen Distance MS, RD, LDN Pager #- [phone] Office#- [phone] After Hours Pager: [phone]

## 2019-08-14 NOTE — Consult Note (Signed)
PHARMACY CONSULT NOTE - FOLLOW UP  Pharmacy Consult for Electrolyte Monitoring and Replacement   Recent Labs: Potassium (mmol/L)  Date Value  08/14/2019 3.8  10/27/2014 4.0   Magnesium (mg/dL)  Date Value  08/02/2019 1.7  02/25/2013 2.0   Calcium (mg/dL)  Date Value  08/14/2019 9.4   Calcium, Total (mg/dL)  Date Value  10/27/2014 9.0   Albumin (g/dL)  Date Value  08/02/2019 3.6  08/18/2014 3.3 (L)   Phosphorus (mg/dL)  Date Value  02/25/2013 3.8   Sodium (mmol/L)  Date Value  08/14/2019 137  10/27/2014 141     Assessment: 68 YOF admitted with respiratory distress.  Currently off Lasix due to increase in serum creatinine.  Pharmacy has been consulted to monitor electrolytes.  Plan:  Electrolytes currently wnl.  Will monitor with AM labs  Gerald Dexter, PharmD Pharmacy Resident  08/14/2019 8:43 AM

## 2019-08-14 NOTE — Plan of Care (Signed)
Continues to report shortness of breath with exertion.

## 2019-08-14 NOTE — Care Management Important Message (Signed)
Important Message  Patient Details  Name: GUILIANNA MCKOY MRN: [id number] Date of Birth: 1951/02/02   Medicare Important Message Given:  Yes     Dannette Barbara 08/14/2019, 1:28 PM

## 2019-08-15 LAB — GLUCOSE, CAPILLARY: Glucose-Capillary: 109 mg/dL — ABNORMAL HIGH (ref 70–99)

## 2019-08-15 LAB — BASIC METABOLIC PANEL
Anion gap: 10 (ref 5–15)
BUN: 40 mg/dL — ABNORMAL HIGH (ref 8–23)
CO2: 26 mmol/L (ref 22–32)
Calcium: 8.9 mg/dL (ref 8.9–10.3)
Chloride: 103 mmol/L (ref 98–111)
Creatinine, Ser: 1.08 mg/dL — ABNORMAL HIGH (ref 0.44–1.00)
GFR calc Af Amer: >60 mL/min (ref >60)
GFR calc non Af Amer: 53 mL/min — ABNORMAL LOW (ref >60)
Glucose, Bld: 129 mg/dL — ABNORMAL HIGH (ref 70–99)
Potassium: 3.5 mmol/L (ref 3.5–5.1)
Sodium: 139 mmol/L (ref 135–145)

## 2019-08-15 MED ORDER — POTASSIUM CHLORIDE 20 MEQ PO PACK
20.0000 meq | PACK | Freq: Once | ORAL | Status: AC
  Administered 2019-08-15: 20 meq via ORAL
  Filled 2019-08-15: qty 1

## 2019-08-15 NOTE — Progress Notes (Signed)
Patient ambulated twice around nurses station on room air with 02 sats remaining at 99%.

## 2019-08-15 NOTE — Progress Notes (Signed)
Discharge instructions provided to patient. Patient verbalized understanding and will be going home.

## 2019-08-15 NOTE — Consult Note (Signed)
PHARMACY CONSULT NOTE - FOLLOW UP  Pharmacy Consult for Electrolyte Monitoring and Replacement   Recent Labs: Potassium (mmol/L)  Date Value  08/15/2019 3.5  10/27/2014 4.0   Magnesium (mg/dL)  Date Value  08/02/2019 1.7  02/25/2013 2.0   Calcium (mg/dL)  Date Value  08/15/2019 8.9   Calcium, Total (mg/dL)  Date Value  10/27/2014 9.0   Albumin (g/dL)  Date Value  08/02/2019 3.6  08/18/2014 3.3 (L)   Phosphorus (mg/dL)  Date Value  02/25/2013 3.8   Sodium (mmol/L)  Date Value  08/15/2019 139  10/27/2014 141   68 YOF admitted with respiratory distress.  Currently resumed lasix as Scr improved.  Pharmacy has been consulted to monitor electrolytes.  Assessment: Potassium continuing to drop. K 3.8 > 3.5.  K still wnl today, but will supplement due to downtrend.   Plan:  Electrolytes currently wnl.   Will give KCl 20 mEq x 1 dose. Will monitor with AM labs, and order magnesium  Gerald Dexter, PharmD Pharmacy Resident  08/15/2019 8:21 AM

## 2019-08-15 NOTE — Discharge Summary (Signed)
[facility] NAME: Ashley [name]    MR#:  [id number]  DATE OF BIRTH:  December 24, 1950  DATE OF ADMISSION:  08/12/2019 ADMITTING PHYSICIAN: Dustin Flock, MD  DATE OF DISCHARGE: 08/15/2019 11:45 AM  PRIMARY CARE PHYSICIAN: [name], MD    ADMISSION DIAGNOSIS:  Hyperglycemia [R73.9] COPD exacerbation ([place]) [J44.1] Acute on chronic systolic congestive heart failure ([place]) [I50.23] Acute respiratory failure with hypoxia ([place]) [J96.01]  DISCHARGE DIAGNOSIS:  Active Problems:   Acute CHF ([place])   SECONDARY DIAGNOSIS:   Past Medical History:  Diagnosis Date  . AICD (automatic cardioverter/defibrillator) present    on right side  . Anemia   . CAD (coronary artery disease)    s/p CABG  . CHF (congestive heart failure) ([place])   . Chronic kidney disease    renal artery stenosis  . Colon cancer ([place])   . Depression   . GERD (gastroesophageal reflux disease)   . Hx of colonic polyps   . Hyperlipidemia   . Hypertension   . Mitral valve disorder    s/p mitral valve repair wth CABG  . Myocardial infarction ([place])   . Peripheral vascular disease ([place])   . Presence of permanent cardiac pacemaker    Pacemaker/ Defibrillator    HOSPITAL COURSE:   68 year old female with past medical history of coronary artery disease, CKD stage III, previous history of AICD, hypertension, hyperlipidemia, history of colon cancer who presented to the hospital due to shortness of breath.  1.  Acute respiratory failure with hypoxia-secondary to CHF and also COPD. -Patient was diuresed with IV Lasix for congestive heart failure and placed on some prednisone and Pulmicort nebs and DuoNebs for her COPD. -With aggressive therapy patient has significantly improved.  She was ambulated and is no longer hypoxic. -She is therefore being discharged home.  She did not qualify for home oxygen  2.  CHF-acute on chronic systolic dysfunction.  Patient's previous  echocardiogram showed EF of 45%. -Patient was diuresed with IV Lasix and responded well to it.  Her diuretics were held as her creatinine started to rise.  They have since then been resumed orally and her creatinine remained stable. -Patient will continue her oral Lasix, Entresto, carvedilol and Aldactone upon discharge.  She is clinically euvolemic presently.  3.  COPD-mild acute exacerbation. -Patient was given some oral prednisone and some duo nebs and some Pulmicort nebs.  She has clinically improved.  She has no wheezing or bronchospasm.  She is therefore being discharged home on her maintenance inhalers.  She did not qualify for home oxygen.  4. Hyperlipidemia - cont. Atorvastatin.   5. Depression - cont. Lexapro.   6. Anxiety - cont. PRN Xanax.   7. GERD - Pt. Will cont. Omeprazole.   DISCHARGE CONDITIONS:   Stable.   CONSULTS OBTAINED:  Treatment Team:  Teodoro Spray, MD  DRUG ALLERGIES:   Allergies  Allergen Reactions  . Hydrocodone-Acetaminophen Nausea Only    DISCHARGE MEDICATIONS:   Allergies as of 08/15/2019      Reactions   Hydrocodone-acetaminophen Nausea Only      Medication List    STOP taking these medications   potassium chloride SA 20 MEQ tablet Commonly known as: Klor-Con M20     TAKE these medications   acetaminophen 500 MG tablet Commonly known as: TYLENOL Maximum 6 tablets a day. What changed:   how much to take  how to take this  when to take this  reasons to take this   albuterol 108 (90 Base) MCG/ACT inhaler Commonly known as: Ventolin HFA INHALE 2 PUFFS BY MOUTH INTO THE LUNGS EVERY 6 HOURS AS NEEDED FOR WHEEZING What changed:   how much to take  how to take this  when to take this  reasons to take this   ALPRAZolam 0.5 MG tablet Commonly known as: XANAX Take 0.5-1 tablets (0.25-0.5 mg total) by mouth at bedtime. TAKE ONE TABLET BY MOUTH AT BEDTIME AS NEEDED FOR ANXIETY   aspirin 81 MG tablet Take 81 mg by  mouth daily.   atorvastatin 40 MG tablet Commonly known as: LIPITOR TAKE ONE TABLET BY MOUTH ONCE DAILY   carvedilol 3.125 MG tablet Commonly known as: COREG Take 1 tablet (3.125 mg total) by mouth 2 (two) times daily with a meal.   clopidogrel 75 MG tablet Commonly known as: PLAVIX Take 1 tablet (75 mg total) by mouth daily.   cyanocobalamin 1000 MCG/ML injection Commonly known as: (VITAMIN B-12) Inject 1 mL weekly into the muscle monthly What changed:   how much to take  how to take this  when to take this  additional instructions   Diphenhist 25 mg capsule Generic drug: diphenhydrAMINE Take 25 mg by mouth at bedtime as needed for allergies or sleep.   Entresto 24-26 MG Generic drug: sacubitril-valsartan Take 1 tablet by mouth 2 (two) times daily.   escitalopram 10 MG tablet Commonly known as: LEXAPRO Take 1 tablet (10 mg total) by mouth daily.   ferrous sulfate 325 (65 FE) MG EC tablet Take 325 mg by mouth daily with breakfast.   furosemide 40 MG tablet Commonly known as: LASIX One tablet twice a day What changed:   how much to take  how to take this  when to take this  additional instructions   ipratropium-albuterol 0.5-2.5 (3) MG/3ML Soln Commonly known as: DUONEB Take 3 mLs by nebulization every 6 (six) hours as needed. What changed: reasons to take this   nitroGLYCERIN 0.4 MG SL tablet Commonly known as: NITROSTAT Place 1 tablet (0.4 mg total) under the tongue every 5 (five) minutes as needed for chest pain.   omeprazole 40 MG capsule Commonly known as: PRILOSEC Take 1 capsule (40 mg total) by mouth daily.   potassium chloride 10 MEQ tablet Commonly known as: KLOR-CON Take 1 tablet (10 mEq total) by mouth daily.   spironolactone 25 MG tablet Commonly known as: Aldactone Take 0.5 tablets (12.5 mg total) by mouth daily.   traZODone 50 MG tablet Commonly known as: DESYREL Take 0.5-1 tablets (25-50 mg total) by mouth daily. One hour  before bedtime   Trelegy Ellipta 100-62.5-25 MCG/INH Aepb Generic drug: Fluticasone-Umeclidin-Vilant Inhale 1 puff into the lungs daily.         DISCHARGE INSTRUCTIONS:   DIET:  Cardiac diet  DISCHARGE CONDITION:  Stable  ACTIVITY:  Activity as tolerated  OXYGEN:  Home Oxygen: No.   Oxygen Delivery: room air  DISCHARGE LOCATION:  home   If you experience worsening of your admission symptoms, develop shortness of breath, life threatening emergency, suicidal or homicidal thoughts you must seek medical attention immediately by calling 911 or calling your MD immediately  if symptoms less severe.  You Must read complete instructions/literature along with all the possible adverse reactions/side effects for all the Medicines you take and that have been prescribed to you. Take any new Medicines after you have completely understood and accpet all the possible adverse reactions/side effects.   Please note  You were cared for by a hospitalist during your hospital stay. If you have any questions about your discharge medications or the care you received while you were in the hospital after you are discharged, you can call the unit and asked to speak with the hospitalist on call if the hospitalist that took care of you is not available. Once you are discharged, your primary care physician will handle any further medical issues. Please note that NO REFILLS for any discharge medications will be authorized once you are discharged, as it is imperative that you return to your primary care physician (or establish a relationship with a primary care physician if you do not have one) for your aftercare needs so that they can reassess your need for medications and monitor your lab values.     Today   Shortness of breath is improved, patient is no longer hypoxic, no wheezing, bronchospasm.  Will discharge home today.  Patient is in agreement.  VITAL SIGNS:  Blood pressure 139/86, pulse 72,  temperature 97.7 F (36.5 C), temperature source Oral, resp. rate 18, height 5\' 4"  (1.626 m), weight 74 kg, SpO2 96 %.  I/O:    Intake/Output Summary (Last 24 hours) at 08/15/2019 1529 Last data filed at 08/15/2019 1018 Gross per 24 hour  Intake 120 ml  Output -  Net 120 ml    PHYSICAL EXAMINATION:  GENERAL:  68 y.o.-year-old patient lying in the bed with no acute distress.  EYES: Pupils equal, round, reactive to light and accommodation. No scleral icterus. Extraocular muscles intact.  HEENT: Head atraumatic, normocephalic. Oropharynx and nasopharynx clear.  NECK:  Supple, no jugular venous distention. No thyroid enlargement, no tenderness.  LUNGS: Normal breath sounds bilaterally, no wheezing, rales,rhonchi. No use of accessory muscles of respiration.  CARDIOVASCULAR: S1, S2 normal. No murmurs, rubs, or gallops.  ABDOMEN: Soft, non-tender, non-distended. Bowel sounds present. No organomegaly or mass.  EXTREMITIES: No pedal edema, cyanosis, or clubbing.  NEUROLOGIC: Cranial nerves II through XII are intact. No focal motor or sensory defecits b/l.  PSYCHIATRIC: The patient is alert and oriented x 3.  SKIN: No obvious rash, lesion, or ulcer.   DATA REVIEW:   CBC Recent Labs  Lab 08/14/19 0440  WBC 10.9*  HGB 10.5*  HCT 32.2*  PLT 234    Chemistries  Recent Labs  Lab 08/15/19 0529  NA 139  K 3.5  CL 103  CO2 26  GLUCOSE 129*  BUN 40*  CREATININE 1.08*  CALCIUM 8.9    Cardiac Enzymes No results for input(s): TROPONINI in the last 168 hours.  Microbiology Results  Results for orders placed or performed during the hospital encounter of 08/12/19  SARS Coronavirus 2 [facility] order, Performed in [facility] hospital lab) Nasopharyngeal Nasopharyngeal Swab     Status: None   Collection Time: 08/12/19  7:52 AM   Specimen: Nasopharyngeal Swab  Result Value Ref Range Status   SARS Coronavirus 2 NEGATIVE NEGATIVE Final    Comment: (NOTE) If result is  NEGATIVE SARS-CoV-2 target nucleic acids are NOT DETECTED. The SARS-CoV-2 RNA is generally detectable in upper and lower  respiratory specimens during the acute phase of infection. The lowest  concentration of SARS-CoV-2 viral copies this assay can detect is 250  copies / mL. A negative result does not preclude SARS-CoV-2 infection  and should not be used as the sole basis for treatment or other  patient management decisions.  A negative result may occur with  improper specimen collection / handling,  submission of specimen other  than nasopharyngeal swab, presence of viral mutation(s) within the  areas targeted by this assay, and inadequate number of viral copies  (<250 copies / mL). A negative result must be combined with clinical  observations, patient history, and epidemiological information. If result is POSITIVE SARS-CoV-2 target nucleic acids are DETECTED. The SARS-CoV-2 RNA is generally detectable in upper and lower  respiratory specimens dur ing the acute phase of infection.  Positive  results are indicative of active infection with SARS-CoV-2.  Clinical  correlation with patient history and other diagnostic information is  necessary to determine patient infection status.  Positive results do  not rule out bacterial infection or co-infection with other viruses. If result is PRESUMPTIVE POSTIVE SARS-CoV-2 nucleic acids MAY BE PRESENT.   A presumptive positive result was obtained on the submitted specimen  and confirmed on repeat testing.  While 2019 novel coronavirus  (SARS-CoV-2) nucleic acids may be present in the submitted sample  additional confirmatory testing may be necessary for epidemiological  and / or clinical management purposes  to differentiate between  SARS-CoV-2 and other Sarbecovirus currently known to infect humans.  If clinically indicated additional testing with an alternate test  methodology [phone]) is advised. The SARS-CoV-2 RNA is generally  detectable  in upper and lower respiratory sp ecimens during the acute  phase of infection. The expected result is Negative. Fact Sheet for Patients:  StrictlyIdeas.no Fact Sheet for Healthcare Providers: BankingDealers.co.za This test is not yet approved or cleared by the [place] FDA and has been authorized for detection and/or diagnosis of SARS-CoV-2 by FDA under an Emergency Use Authorization (EUA).  This EUA will remain in effect (meaning this test can be used) for the duration of the COVID-19 declaration under Section 564(b)(1) of the Act, 21 U.S.C. section 360bbb-3(b)(1), unless the authorization is terminated or revoked sooner. Performed at [facility], [address] [place], [place] [address]   MRSA PCR Screening     Status: None   Collection Time: 08/12/19 10:40 AM   Specimen: Nasopharyngeal  Result Value Ref Range Status   MRSA by PCR NEGATIVE NEGATIVE Final    Comment:        The GeneXpert MRSA Assay (FDA approved for NASAL specimens only), is one component of a comprehensive MRSA colonization surveillance program. It is not intended to diagnose MRSA infection nor to guide or monitor treatment for MRSA infections. Performed at [facility], [address], [place], [place] [address]     RADIOLOGY:  No results found.    Management plans discussed with the patient, family and they are in agreement.  CODE STATUS:  Code Status History    Date Active Date Inactive Code Status Order ID Comments User Context   08/12/2019 0953 08/15/2019 1503 Full Code [id number]  Dustin Flock, MD ED   TOTAL TIME TAKING CARE OF THIS PATIENT: 40 minutes.    Henreitta Leber M.D on 08/15/2019 at 3:29 PM  Between 7am to 6pm - Pager - [phone]  After 6pm go to [email or web address] - Proofreader  [facility]  Office  [phone]  CC: Primary care physician; [name],  MD

## 2019-08-15 NOTE — Plan of Care (Signed)
  Problem: Clinical Measurements: Goal: Cardiovascular complication will be avoided Outcome: Progressing   Problem: Activity: Goal: Risk for activity intolerance will decrease Outcome: Progressing   Problem: Elimination: Goal: Will not experience complications related to bowel motility Outcome: Progressing   Problem: Education: Goal: Ability to verbalize understanding of medication therapies will improve Outcome: Progressing   Problem: Activity: Goal: Capacity to carry out activities will improve Outcome: Progressing   Problem: Cardiac: Goal: Ability to achieve and maintain adequate cardiopulmonary perfusion will improve Outcome: Progressing

## 2019-08-17 IMAGING — CR CHEST - 2 VIEW
2 series · 2 of 2 positions shown · non-contrast
Comparison: 05/27/2019, 03/16/2019 and 11/08/2018

CLINICAL DATA: Hypoxia.

EXAM:
CHEST - 2 VIEW

[chest lat]
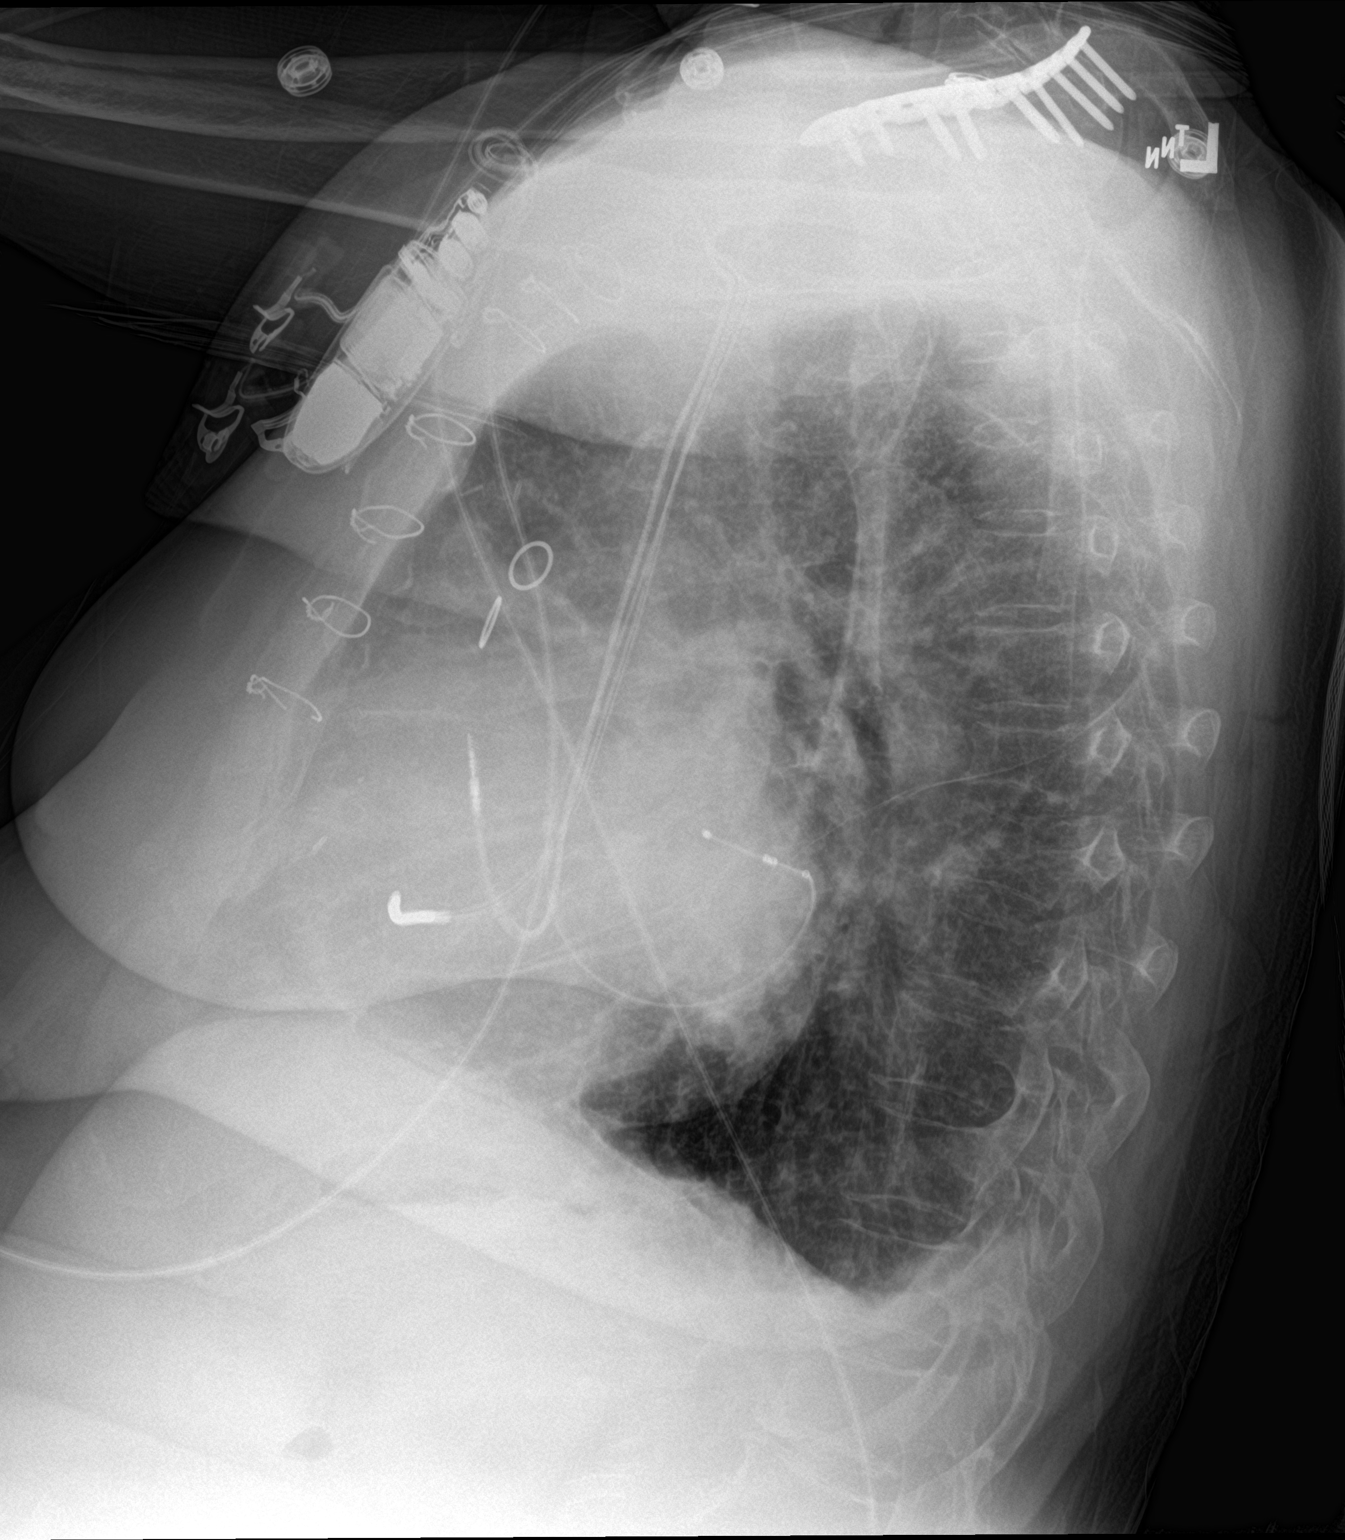

[chest ap]
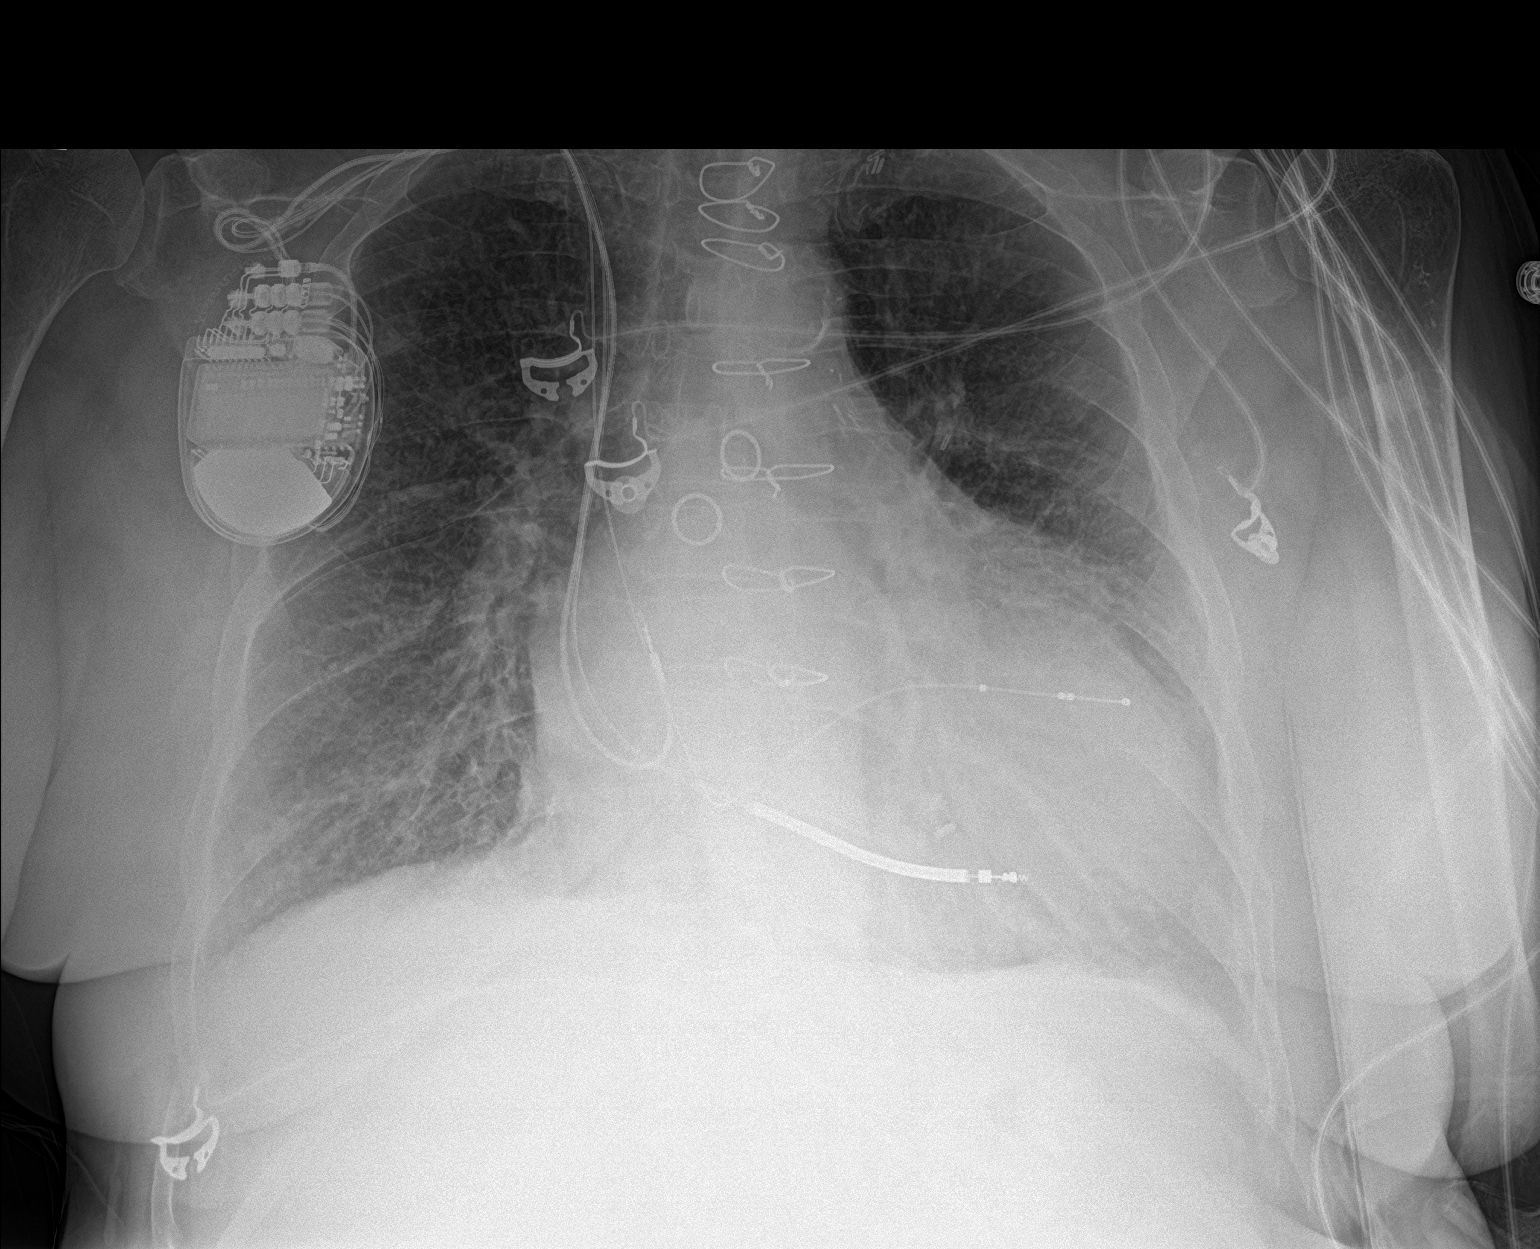

[2 of 2 positions shown; findings below may reference images not displayed]

FINDINGS: Chronic cardiomegaly. CABG. AICD in place. Aortic atherosclerosis.
There is slight diffuse accentuation of the interstitial markings
with tiny bilateral pleural effusions. The pulmonary vascularity is
now within normal limits.

The pulmonary edema and effusions have improved since the prior
study. Vascular congestion has resolved
IMPRESSION: 1. Improving pulmonary edema and effusions.
2. Aortic atherosclerosis.

## 2019-08-18 ENCOUNTER — Other Ambulatory Visit: Payer: Self-pay | Admitting: *Deleted

## 2019-08-18 ENCOUNTER — Telehealth: Payer: Self-pay

## 2019-08-18 ENCOUNTER — Encounter: Payer: Self-pay | Admitting: *Deleted

## 2019-08-18 IMAGING — DX PORTABLE CHEST - 1 VIEW
1 series · 1 of 1 positions shown · non-contrast
Comparison: PA and lateral chest 05/28/2019 and 11/08/2018.
Single-view of the chest 05/27/2019.

CLINICAL DATA: Patient admitted 05/27/2019 with respiratory
distress.

EXAM:
PORTABLE CHEST 1 VIEW

[chest ap]
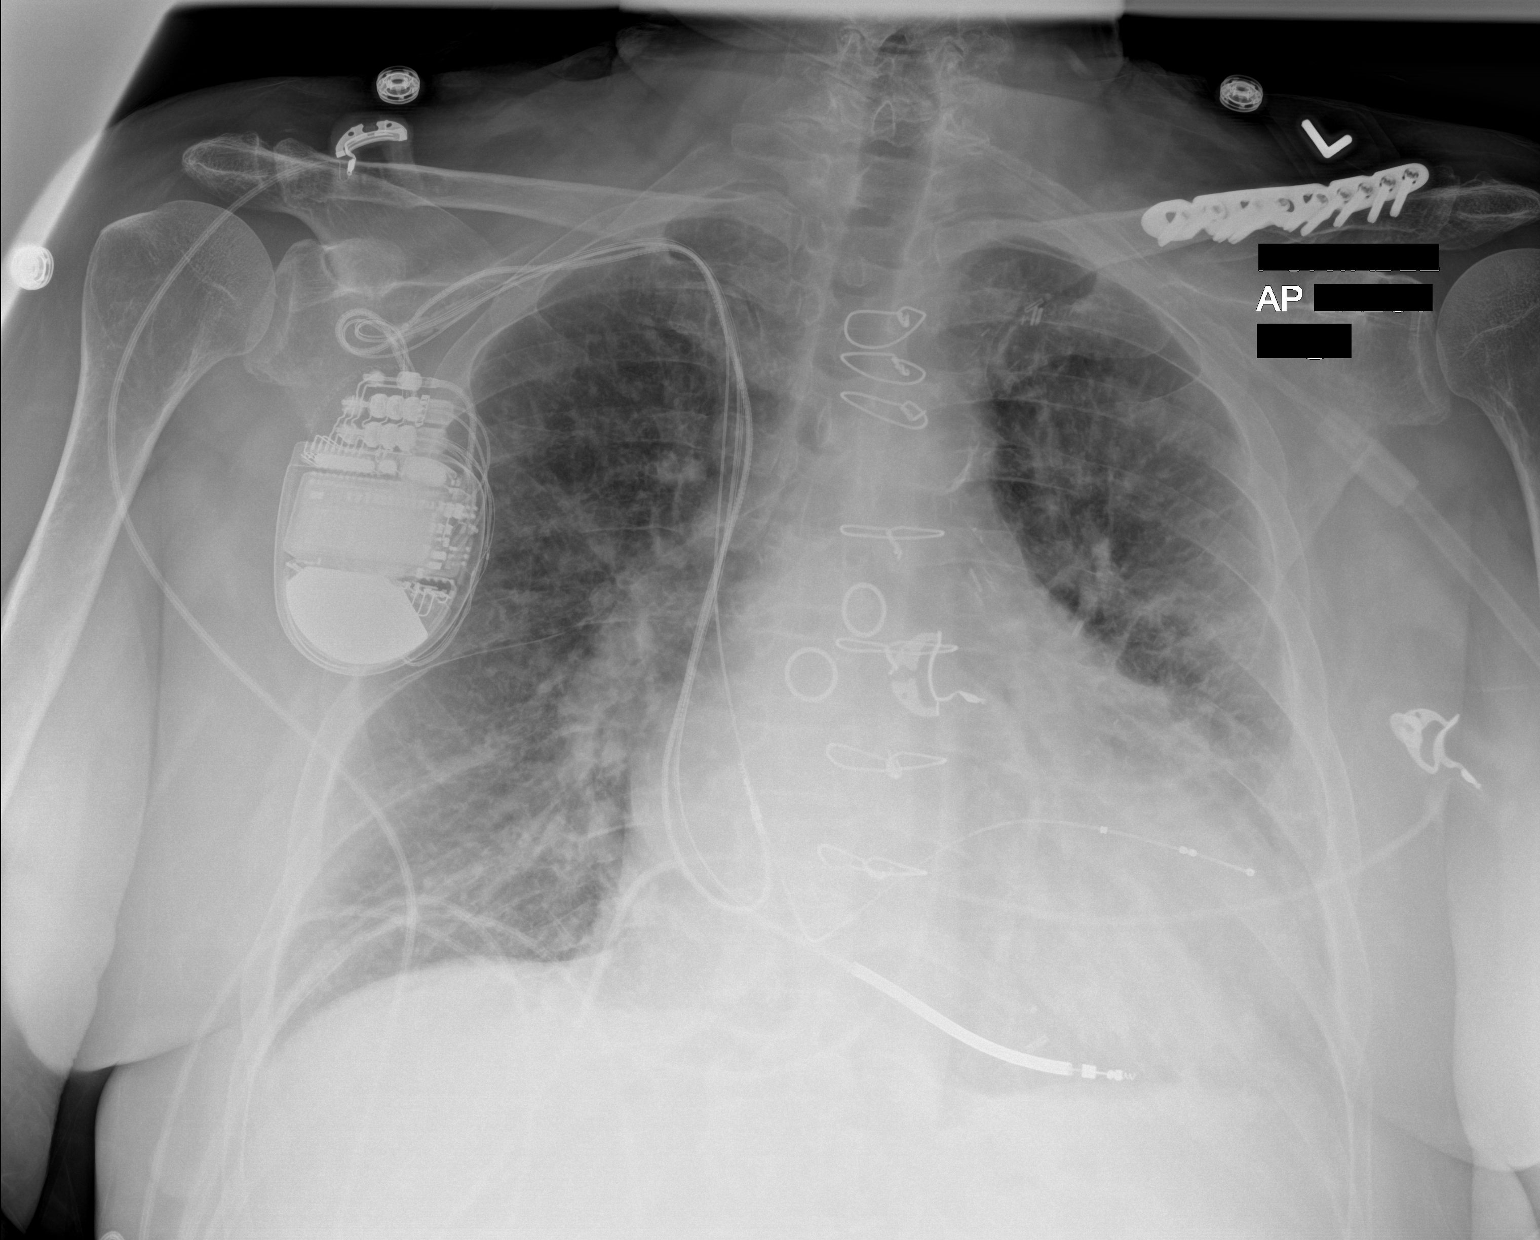

[1 of 1 positions shown; findings below may reference images not displayed]

FINDINGS: The patient is status post CABG with a pacing device in place.
Cardiomegaly and atherosclerosis again seen. Pulmonary edema appears
slightly worse than on the most recent comparison. Trace pleural
effusions noted. No pneumothorax. No acute bony abnormality.
IMPRESSION: Mildly increased pulmonary edema since the most recent exam.

Cardiomegaly.

Trace pleural effusions.

Atherosclerosis.

## 2019-08-18 NOTE — Telephone Encounter (Signed)
Unable to reach patient for transitional care management. First attempt. Will continue to monitor as appropriate.

## 2019-08-18 NOTE — Telephone Encounter (Signed)
Patient plans to hospital follow up with cardiology on 07/23/19.  She plans to keep telephone visit with pcp on 07/26/19. No further transitional care management required at this time.

## 2019-08-18 NOTE — Patient Outreach (Signed)
[name]) Care Management [place] Telephone Outreach PCP completes Transition of Care follow up post-hospital discharge Post-hospital discharge day # 3  08/18/2019  Ashley [name] 07-16-1951 [id number]  Successful telephone outreach to Ashley [name], 68 y/o femalereferred to [place] by [facility] Liaison RN CM after recent hospitalization July 21-25, 2020 for acute on chronic CHF exacerbation.Patientwas discharged from hospital to home/ self-care without home health services in place. Patient has history including, but not limited to, combined CHF; CAD with previous MI/ CABG, and ICD; HTN/ HLD; GERD; COPD; tobacco use.  Unfortunately, patient experiencedmultiple recenthospital readmissions: 1)August 13-15, 2020 for acute respiratory failure with hypoxia/18 days after her hospital discharge, ; patient was again discharged home without home health services in place. 2) September 9-12, 2020 for acute pulmonary edema; respiratory failure with hypoxia; and CHF exacerbation/ 25 days after previous hospital discharge; was again discharged home without home health services in place, as patient continually refuses need for home health. 3) ED visit 08/02/2019 for chest pain; patient was also diagnosed with ETOH intoxication and discharged homeafter being ruled out for ACS/ MI  Unfortunately, patient has experienced third hospital readmission October 6-9, 2020, 24 days after her last hospital discharge, again for COPD/ CHF exacerbation.  HIPAA/ identity verified with patient; pleasant 25 minute phone call.  Today, patient again reports "doing pretty good," post-most recent hospital discharge; states she "is afraid to jinx" herself, as with every recent hospitalization, she has "felt great" prior to being admitted to hospital.  Patient reports that with most recent hospital admission, she was again "awoken suddenly in the middle of the night" with  difficulty breathing; states she followed action plan for yellow CHF zone and "nothing worked," as "with all the hospital visits."  Reports she eventually had to contact EMS, as nothing she tried (rescue inhalers/ nebulizer/ pursed lip breathing/ rest) helped her regain her breath.  Patient verbalizes frustration that "it seems like they can't figure out," why patient continues having sudden episodes of shortness of breath that can not be controlled at home with prescribed treatment.  Patient denies doing anything "out of the ordinary" like consuming high salt food prior to most recent hospital admission, stating "things were fine when I went to bed that night."  Questioned patient around smoking status, as the hospital cardiology notes from recent admission reported that patient has continued smoking.  Patient expresses anger/ frustration that this is "in (her) chart," adamantly stating that she has NOT smoked AT ALL-- "None" since she quit on June 19, 2019.  Patient stated that she explicitly told the hospital cardiologist this, and she is understandably upset that it is documented that she is continuing to smoke, when she is and has not been smoking since June 19, 2019.  Positive reinforcement provided to patient that she has NOT resumed smoking and I encouraged her to report her concerns with the hospital documentation to her cardiology provider with her scheduled office visit later this week.  Patient denies pain/ new recent falls and sounds to be in no distress throughout phone call today.  She further reports: -- has all medications and is taking as prescribed; denies concerns around medications; Patient was recently discharged from hospital and all medications were reviewed with her today. -- continues to deny community resource needs; SDOH updated for: transportation/ food insecurity/ financial resource strain -- reviewed all upcoming provider appointments with patient: confirms that she  obtained follow up cardiology appointment after we spoke last outreach-  states appointment is scheduled for this Friday 08/21/18.  Discussed paramount importance of this appointment and patient states she will attend, "unless I wake up again and can't breathe and have to go back to the hospital." -- states that while at hospital cardiologist told her that if she has to have a MVR revision surgery, that it will need to be completed at [facility] -- confirms that she has continued monitoring and recording daily weights post-most recent hospital discharge; states weights post-discharge have all "been 163 pounds," and she endorses this as "her goal; " denies lower extremity swelling -- reports occasional episodes shortness of breath post- most recent hospital discharge, stating that "it may be anxiety" due to "not knowing what sets off my breathing issues at night;" reports recent episodes have been controlled with following established action plan -- reiterated signs/ symptoms yellow CHF zone along with corresponding action plan; patient is again able to verbalize accurate plan of action and expresses frustration that she has had so many hospital admissions without being told what is "causing this to happen;" we discussed speculation around possible need for MVR revision and I reiterated pathophysiology of MV disease in layman's terms, and explained how it could lead to decrease cardiac pump efficiency; throughout conversation today, I stressed with patient importance of attending upcoming scheduled provider appointment and coached her in talking points to cover with provider around plan to address possible need for MVR revision  Patient denies further issues, concerns, or problems today.  I confirmed that patient has my direct phone number, the main [facility] CM office phone number, and the [facility] CM 24-hour nurse advice phone number should issues arise prior to next scheduled [place] outreach.  Encouraged patient to  contact me directly if needs, questions, issues, or concerns arise prior to next scheduled outreach; patient agreed to do so.  Plan:  Patient will take medications as prescribed and will attend all scheduled provider appointments  Patient will promptly notify care providers for any new concerns/ issues/ problems that arise  Patient will continue monitoring/ recording daily weights and following action plan for CHF zones  Patient will continue not smoking  [facility] CM outreach to continue with scheduled phone call in 2 weeks, post-upcoming scheduled provider appointments  [facility] CM Care Plan Problem One     Most Recent Value  Care Plan Problem One  High risk for hospital readmission related to/ as evidenced by recent hospitalization for CHF exacerbation July 21-25, 2020  Role Documenting the Problem One  Care Management [place] for Problem One  Active  [facility] Long Term Goal   Over the next 31 days, patient will not experience unplanned hospital readmission, as evidenced by patient reporting and review of EMR during [place] outreach [Goal reestablished after recent hospital admission]  [place] Term Goal Start Date  08/18/19  Interventions for Problem One Long Term Goal  Discussed with patient her recent hospital admission and current clinical condition,  post-hospital discharge medication review completed,  SDOH updated  [facility] CM Short Term Goal #2   Member will report keeping and attending follow up appointments within the nexft 3 weeks  [facility] CM Short Term Goal #2 Start Date  08/18/19 [facility] extended/ reestablished after recent hospital admission]  Interventions for Short Term Goal #2  Reviewed with patient upcoming scheduled appointments and confirmed that she has a scheduled appointment with cardiology provider for later this week,  confirmed that patient has reliable transportation and accurate understanding  of all provider appointments, along with plans to attend all,   stressed with patient the importance of attending cardiology appointment now that it is finally scheduled    [facility] CM Care Plan Problem Two     Most Recent Value  Care Plan Problem Two  Need for ongoing reinforcement of self-health management strategies for chronic disease state of CHF/ COPD, as evidenced by patient reporting and recent hospitalization for CHF exacerbation  Role Documenting the Problem Two  Care Management Coordinator  Care Plan for Problem Two  Active  Interventions for Problem Two Long Term Goal   Using teachback method, reiterated previously provided education around signs/ symptoms yellow CHGF zone along with corresponding action plan,  confirmed that patient has continued monitoring and recording daily weights and continues to remain quit smoking,  reviewed recent weights at home with patient and assessed use of rescue inhalers/ nebulizer/ diuretics post-most recent hospital dsicharge  [facility] Long Term Goal  Over the next 31 days, patient will continue to monitor and record daily weights at home and will follow action plan for yellow CHF zone, as evidenced by patient reporting and review of same during [place] CM outreach [goal re-established/ modified after recent hospitalization]  [facility] Long Term Goal Start Date  08/18/19  [facility] CM Short Term Goal #1   Over the next 21 days, patient will schedule/ attend follow up appointment with cardiologist, as evidenced by patient reporting and review of EMR during [facility] RN CM outreach  [facility] CM Short Term Goal #1 Start Date  08/11/19  Interventions for Short Term Goal #2   Confirmed that patient has scheduled appointment with cardiology provider and plans to attend,  stressed with patient importance of this appointment and reviewed with her pathophysiology of Mitral valve disease and recent ECHOcardiogram results     [name], RN, BSN, [place] Coordinator [facility] Care Management  [phone]

## 2019-08-20 ENCOUNTER — Ambulatory Visit: Payer: Medicare Other | Admitting: Family

## 2019-08-21 ENCOUNTER — Ambulatory Visit: Payer: Medicare Other | Admitting: Pulmonary Disease

## 2019-08-22 ENCOUNTER — Other Ambulatory Visit: Payer: Self-pay | Admitting: *Deleted

## 2019-08-22 NOTE — Patient Outreach (Signed)
[place] [facility]) Care Management [facility] CM Multidisciplinary Case Conference 08/22/2019  Ashley [name] 1951-07-18 [id number]  [facility] CM Multidisciplinary case conference re: Update in blue text  Ashley [name], 68 y/o femalereferred to [place] by [facility] Liaison RN CM after recent hospitalization July 21-25, 2020 for acute on chronic CHF exacerbation.Patientwas discharged from hospital to home/ self-care without home health services in place. Patient has history including, but not limited to, combined CHF; CAD with previous MI/ CABG, and ICD; HTN/ HLD; GERD; COPD; tobacco use.  Unfortunately, patient experiencedmultiple recenthospital readmissions: 1)August 13-15, 2020 for acute respiratory failure with hypoxia/18 days after her hospital discharge, ; patient was again discharged home without home health services in place. 2) September 9-12, 2020 for acute pulmonary edema; respiratory failure with hypoxia; and CHF exacerbation/ 25 days after previous hospital discharge; was again discharged home without home health services in place, as patient continually refuses need for home health. 3) ED visit 08/02/2019 for chest pain; patient was also diagnosed with ETOH intoxication and discharged homeafter being ruled out for ACS/ MI  Unfortunately,patient has experienced third hospital readmission October 6-9, 2020, 24 days after her last hospital discharge, again for COPD/ CHF exacerbation.  This represents patient's fourth hospital admission since July 2020  -- patient to attend cardiology office visit this afternoon, 08/22/2019 for follow up and discussion/ development of plan for possible need for MVR revision/ replacement -- patient maintains that she has continued NOT smoking and only consumes alcohol socially; continues to endorse that her family members that do smoke are no longer smoking inside the home -- patient frustrated around cardiology notes from  most recent hospitalization documenting that she is still smoking- patient adamant that she has been quit since June 19, 2019  -- patient frustrated that when she is at hospital there has not been discussion around transferring her to [facility] as inpatient (as per discussion patient had with cardiology during last hospital visit) and fixing the MV, rather than discharging her home to follow up as outpatient; after her in-patient ECHO done in July patient felt that the potential issue around the need for MVR/ revision was not discussed with her as a priority need.  Plan:  [facility] CM team to continue to follow patient closely and follow up on cardiology office visit today and PCP office visit on Monday 08/25/2019  Barriers: From 07/04/2019 and 08/01/2019 case conference:  -- Extensive clinical history: cardiac and respiratory; overall fragile with occasional- intermittent episodes of extreme overnight weight gain > 3 lbs -- Ongoing smoking-- patient reports has recently quit over last 2 weeks after last hospital discharge: patient endorses that she quit smoking on June 19, 2019 at time of her hospital admission -- Need for [place] involved/ addressing: medications now optimized; [name] continues following for ongoing management of patient medication needs -- questionable issues around patent's perception of health status; may not be based in reality; patient not proactive in addressing acute issues; has not followed up on requests for follow up needed by her to address her medication concerns; ongoing uncertainty around patient's perception/ motivation of self- management/ control her clinical condition  -- good family support overall; family members have now taken their own smoking outside to help patient remain smoke free -- patient reported stress around her fiancee's health/ recent hospitalizations, although she is noted to be on multiple medications for  stress/ anxiety control -- updated ECHO performed during most recent hospital admission, with EF  noted 45% (decreased from 55-60% in 2017)- inpatient medical team suspects possibility that patient's MVR from 2007 may need to be surgically revised-- appears patient has not had any recent visits with cardiologist; question need for appointment to address need for MVR revision   -- patient continually refuses home health services with each hospital admission -- patient noted to have increased utilization/ hospital admissions since May 2020-- has had a total of 4 hospital admissions in 4 months; previously had only one admission in 2019 -- patient missed scheduled CHF clinic follow up visit scheduled for 07/29/2019: stated car trouble, but continues to deny transportation needs   [facility] CM Team Discussion/ plan: From 07/04/2019 case conference:  -- continue efforts to encourage patient to remain smoke free: collaborate with providers/ new pulmonary referral (pending): patient attended pulmonary referral provider office visit on 07/24/2019; pulmonary provider agreed that patient's recent issues with respiratory status may likely be related more to need for revision of MVR; PFT's pending- appointment  -- engage patient's fiancee/ family in efforts to partner with patient and also quit smoking-- follow up patient/ family on efforts -- explore nutritional habits/ practices more thoroughly with ongoing outreaches -- engage patient's PCP in efforts to discern reason for multiple recent hospitalizations -- encourage/ facilitate as indicated patient to promptly schedule hospital discharge cardiology appointment for evaluation of need for MVR revision -- continue to follow closely;  resubmit to [facility] case discussion in 3 weeks for update, unless indicated sooner  [name], RN, BSN, [employer] Coordinator [facility] Care Management  [phone]

## 2019-08-24 ENCOUNTER — Inpatient Hospital Stay
Admission: EM | Admit: 2019-08-24 | Discharge: 2019-08-27 | DRG: 286 | Disposition: A | Discharge status: Home / Self Care | Payer: Medicare Other | Attending: Internal Medicine | Admitting: Internal Medicine

## 2019-08-24 ENCOUNTER — Other Ambulatory Visit: Payer: Self-pay

## 2019-08-24 ENCOUNTER — Emergency Department: Payer: Medicare Other

## 2019-08-24 DIAGNOSIS — E872 Acidosis: Secondary | ICD-10-CM | POA: Diagnosis present

## 2019-08-24 DIAGNOSIS — J9601 Acute respiratory failure with hypoxia: Secondary | ICD-10-CM | POA: Diagnosis present

## 2019-08-24 DIAGNOSIS — Z885 Allergy status to narcotic agent status: Secondary | ICD-10-CM

## 2019-08-24 DIAGNOSIS — R0603 Acute respiratory distress: Secondary | ICD-10-CM | POA: Diagnosis not present

## 2019-08-24 DIAGNOSIS — J441 Chronic obstructive pulmonary disease with (acute) exacerbation: Secondary | ICD-10-CM | POA: Diagnosis present

## 2019-08-24 DIAGNOSIS — I2510 Atherosclerotic heart disease of native coronary artery without angina pectoris: Secondary | ICD-10-CM | POA: Diagnosis present

## 2019-08-24 DIAGNOSIS — N189 Chronic kidney disease, unspecified: Secondary | ICD-10-CM | POA: Diagnosis not present

## 2019-08-24 DIAGNOSIS — R0602 Shortness of breath: Secondary | ICD-10-CM | POA: Diagnosis not present

## 2019-08-24 DIAGNOSIS — N179 Acute kidney failure, unspecified: Secondary | ICD-10-CM | POA: Diagnosis present

## 2019-08-24 DIAGNOSIS — N1831 Chronic kidney disease, stage 3a: Secondary | ICD-10-CM | POA: Diagnosis present

## 2019-08-24 DIAGNOSIS — Z20828 Contact with and (suspected) exposure to other viral communicable diseases: Secondary | ICD-10-CM | POA: Diagnosis present

## 2019-08-24 DIAGNOSIS — F419 Anxiety disorder, unspecified: Secondary | ICD-10-CM | POA: Diagnosis present

## 2019-08-24 DIAGNOSIS — I10 Essential (primary) hypertension: Secondary | ICD-10-CM | POA: Diagnosis not present

## 2019-08-24 DIAGNOSIS — I5043 Acute on chronic combined systolic (congestive) and diastolic (congestive) heart failure: Secondary | ICD-10-CM | POA: Diagnosis present

## 2019-08-24 DIAGNOSIS — I420 Dilated cardiomyopathy: Secondary | ICD-10-CM | POA: Diagnosis present

## 2019-08-24 DIAGNOSIS — Z841 Family history of disorders of kidney and ureter: Secondary | ICD-10-CM

## 2019-08-24 DIAGNOSIS — R062 Wheezing: Secondary | ICD-10-CM | POA: Diagnosis not present

## 2019-08-24 DIAGNOSIS — D649 Anemia, unspecified: Secondary | ICD-10-CM | POA: Diagnosis present

## 2019-08-24 DIAGNOSIS — Z95810 Presence of automatic (implantable) cardiac defibrillator: Secondary | ICD-10-CM | POA: Diagnosis not present

## 2019-08-24 DIAGNOSIS — A419 Sepsis, unspecified organism: Secondary | ICD-10-CM | POA: Diagnosis not present

## 2019-08-24 DIAGNOSIS — Z833 Family history of diabetes mellitus: Secondary | ICD-10-CM

## 2019-08-24 DIAGNOSIS — Z7982 Long term (current) use of aspirin: Secondary | ICD-10-CM

## 2019-08-24 DIAGNOSIS — J189 Pneumonia, unspecified organism: Secondary | ICD-10-CM | POA: Diagnosis present

## 2019-08-24 DIAGNOSIS — D519 Vitamin B12 deficiency anemia, unspecified: Secondary | ICD-10-CM | POA: Diagnosis present

## 2019-08-24 DIAGNOSIS — K219 Gastro-esophageal reflux disease without esophagitis: Secondary | ICD-10-CM | POA: Diagnosis present

## 2019-08-24 DIAGNOSIS — E1122 Type 2 diabetes mellitus with diabetic chronic kidney disease: Secondary | ICD-10-CM | POA: Diagnosis present

## 2019-08-24 DIAGNOSIS — Z79899 Other long term (current) drug therapy: Secondary | ICD-10-CM

## 2019-08-24 DIAGNOSIS — Z8249 Family history of ischemic heart disease and other diseases of the circulatory system: Secondary | ICD-10-CM

## 2019-08-24 DIAGNOSIS — R0689 Other abnormalities of breathing: Secondary | ICD-10-CM | POA: Diagnosis not present

## 2019-08-24 DIAGNOSIS — Z881 Allergy status to other antibiotic agents status: Secondary | ICD-10-CM

## 2019-08-24 DIAGNOSIS — J440 Chronic obstructive pulmonary disease with acute lower respiratory infection: Secondary | ICD-10-CM | POA: Diagnosis present

## 2019-08-24 DIAGNOSIS — I340 Nonrheumatic mitral (valve) insufficiency: Secondary | ICD-10-CM | POA: Diagnosis present

## 2019-08-24 DIAGNOSIS — E1151 Type 2 diabetes mellitus with diabetic peripheral angiopathy without gangrene: Secondary | ICD-10-CM | POA: Diagnosis present

## 2019-08-24 DIAGNOSIS — R739 Hyperglycemia, unspecified: Secondary | ICD-10-CM | POA: Diagnosis not present

## 2019-08-24 DIAGNOSIS — I25810 Atherosclerosis of coronary artery bypass graft(s) without angina pectoris: Secondary | ICD-10-CM | POA: Diagnosis present

## 2019-08-24 DIAGNOSIS — J9600 Acute respiratory failure, unspecified whether with hypoxia or hypercapnia: Secondary | ICD-10-CM | POA: Diagnosis not present

## 2019-08-24 DIAGNOSIS — J811 Chronic pulmonary edema: Secondary | ICD-10-CM | POA: Diagnosis not present

## 2019-08-24 DIAGNOSIS — T368X5A Adverse effect of other systemic antibiotics, initial encounter: Secondary | ICD-10-CM | POA: Diagnosis not present

## 2019-08-24 DIAGNOSIS — I252 Old myocardial infarction: Secondary | ICD-10-CM

## 2019-08-24 DIAGNOSIS — E1165 Type 2 diabetes mellitus with hyperglycemia: Secondary | ICD-10-CM | POA: Diagnosis present

## 2019-08-24 DIAGNOSIS — I5023 Acute on chronic systolic (congestive) heart failure: Secondary | ICD-10-CM | POA: Diagnosis not present

## 2019-08-24 DIAGNOSIS — Z7902 Long term (current) use of antithrombotics/antiplatelets: Secondary | ICD-10-CM

## 2019-08-24 DIAGNOSIS — E785 Hyperlipidemia, unspecified: Secondary | ICD-10-CM | POA: Diagnosis present

## 2019-08-24 DIAGNOSIS — I130 Hypertensive heart and chronic kidney disease with heart failure and stage 1 through stage 4 chronic kidney disease, or unspecified chronic kidney disease: Principal | ICD-10-CM | POA: Diagnosis present

## 2019-08-24 DIAGNOSIS — I272 Pulmonary hypertension, unspecified: Secondary | ICD-10-CM | POA: Diagnosis present

## 2019-08-24 DIAGNOSIS — Z7951 Long term (current) use of inhaled steroids: Secondary | ICD-10-CM

## 2019-08-24 DIAGNOSIS — Z8719 Personal history of other diseases of the digestive system: Secondary | ICD-10-CM

## 2019-08-24 DIAGNOSIS — Z85038 Personal history of other malignant neoplasm of large intestine: Secondary | ICD-10-CM

## 2019-08-24 DIAGNOSIS — I509 Heart failure, unspecified: Secondary | ICD-10-CM | POA: Diagnosis not present

## 2019-08-24 DIAGNOSIS — F329 Major depressive disorder, single episode, unspecified: Secondary | ICD-10-CM | POA: Diagnosis present

## 2019-08-24 DIAGNOSIS — J80 Acute respiratory distress syndrome: Secondary | ICD-10-CM | POA: Diagnosis not present

## 2019-08-24 DIAGNOSIS — I255 Ischemic cardiomyopathy: Secondary | ICD-10-CM | POA: Diagnosis present

## 2019-08-24 DIAGNOSIS — Z8349 Family history of other endocrine, nutritional and metabolic diseases: Secondary | ICD-10-CM

## 2019-08-24 DIAGNOSIS — R000 Tachycardia, unspecified: Secondary | ICD-10-CM | POA: Diagnosis not present

## 2019-08-24 DIAGNOSIS — Z87891 Personal history of nicotine dependence: Secondary | ICD-10-CM

## 2019-08-24 DIAGNOSIS — Z8261 Family history of arthritis: Secondary | ICD-10-CM

## 2019-08-24 LAB — CBC WITH DIFFERENTIAL/PLATELET
Abs Immature Granulocytes: 0.26 10*3/uL — ABNORMAL HIGH (ref 0.00–0.07)
Basophils Absolute: 0.2 10*3/uL — ABNORMAL HIGH (ref 0.0–0.1)
Basophils Relative: 1 %
Eosinophils Absolute: 0.3 10*3/uL (ref 0.0–0.5)
Eosinophils Relative: 1 %
HCT: 41.7 % (ref 36.0–46.0)
Hemoglobin: 13.8 g/dL (ref 12.0–15.0)
Immature Granulocytes: 1 %
Lymphocytes Relative: 11 %
Lymphs Abs: 2.4 10*3/uL (ref 0.7–4.0)
MCH: 31.6 pg (ref 26.0–34.0)
MCHC: 33.1 g/dL (ref 30.0–36.0)
MCV: 95.4 fL (ref 80.0–100.0)
Monocytes Absolute: 1.3 10*3/uL — ABNORMAL HIGH (ref 0.1–1.0)
Monocytes Relative: 5 %
Neutro Abs: 18.7 10*3/uL — ABNORMAL HIGH (ref 1.7–7.7)
Neutrophils Relative %: 81 %
Platelets: 463 10*3/uL — ABNORMAL HIGH (ref 150–400)
RBC: 4.37 MIL/uL (ref 3.87–5.11)
RDW: 15.3 % (ref 11.5–15.5)
WBC: 23 10*3/uL — ABNORMAL HIGH (ref 4.0–10.5)
nRBC: 0 % (ref 0.0–0.2)

## 2019-08-24 LAB — COMPREHENSIVE METABOLIC PANEL
ALT: 16 U/L (ref 0–44)
AST: 20 U/L (ref 15–41)
Albumin: 4.1 g/dL (ref 3.5–5.0)
Alkaline Phosphatase: 92 U/L (ref 38–126)
Anion gap: 14 (ref 5–15)
BUN: 21 mg/dL (ref 8–23)
CO2: 21 mmol/L — ABNORMAL LOW (ref 22–32)
Calcium: 9 mg/dL (ref 8.9–10.3)
Chloride: 99 mmol/L (ref 98–111)
Creatinine, Ser: 1.13 mg/dL — ABNORMAL HIGH (ref 0.44–1.00)
GFR calc Af Amer: 59 mL/min — ABNORMAL LOW (ref >60)
GFR calc non Af Amer: 51 mL/min — ABNORMAL LOW (ref >60)
Glucose, Bld: 379 mg/dL — ABNORMAL HIGH (ref 70–99)
Potassium: 5 mmol/L (ref 3.5–5.1)
Sodium: 134 mmol/L — ABNORMAL LOW (ref 135–145)
Total Bilirubin: 0.9 mg/dL (ref 0.3–1.2)
Total Protein: 7.4 g/dL (ref 6.5–8.1)

## 2019-08-24 LAB — TROPONIN I (HIGH SENSITIVITY)
Troponin I (High Sensitivity): 21 ng/L — ABNORMAL HIGH (ref <18)
Troponin I (High Sensitivity): 24 ng/L — ABNORMAL HIGH (ref <18)

## 2019-08-24 LAB — LACTIC ACID, PLASMA
Lactic Acid, Venous: 3.4 mmol/L (ref 0.5–1.9)
Lactic Acid, Venous: 2 mmol/L (ref 0.5–1.9)

## 2019-08-24 LAB — MRSA PCR SCREENING: MRSA by PCR: NEGATIVE

## 2019-08-24 LAB — RESPIRATORY PANEL BY PCR

## 2019-08-24 LAB — BETA-HYDROXYBUTYRIC ACID: Beta-Hydroxybutyric Acid: 0.81 mmol/L — ABNORMAL HIGH (ref 0.05–0.27)

## 2019-08-24 LAB — BLOOD GAS, VENOUS
Acid-base deficit: 5.9 mmol/L — ABNORMAL HIGH (ref 0.0–2.0)
Bicarbonate: 21.1 mmol/L (ref 20.0–28.0)
O2 Saturation: 86 %
Patient temperature: 37
pCO2, Ven: 46 mmHg (ref 44.0–60.0)
pH, Ven: 7.27 (ref 7.250–7.430)
pO2, Ven: 59 mmHg — ABNORMAL HIGH (ref 32.0–45.0)

## 2019-08-24 LAB — GLUCOSE, CAPILLARY: Glucose-Capillary: 164 mg/dL — ABNORMAL HIGH (ref 70–99)

## 2019-08-24 LAB — PROCALCITONIN: Procalcitonin: <0.1 ng/mL

## 2019-08-24 LAB — PROTIME-INR
INR: 1.1 (ref 0.8–1.2)
Prothrombin Time: 14.1 seconds (ref 11.4–15.2)

## 2019-08-24 LAB — SARS CORONAVIRUS 2 BY RT PCR (HOSPITAL ORDER, PERFORMED IN ~~LOC~~ HOSPITAL LAB): SARS Coronavirus 2: NEGATIVE

## 2019-08-24 LAB — KETONES, URINE: Ketones, ur: NEGATIVE mg/dL

## 2019-08-24 LAB — APTT: aPTT: 28 seconds (ref 24–36)

## 2019-08-24 LAB — BRAIN NATRIURETIC PEPTIDE: B Natriuretic Peptide: 2357 pg/mL — ABNORMAL HIGH (ref 0.0–100.0)

## 2019-08-24 MED ORDER — FERROUS SULFATE 325 (65 FE) MG PO TABS
325.0000 mg | ORAL_TABLET | Freq: Every day | ORAL | Status: DC
  Administered 2019-08-25 – 2019-08-27 (×3): 325 mg via ORAL
  Filled 2019-08-24 (×2): qty 1

## 2019-08-24 MED ORDER — ACETAMINOPHEN 325 MG PO TABS: 650.0000 mg | ORAL_TABLET | Freq: Four times a day (QID) | ORAL | Status: DC | PRN

## 2019-08-24 MED ORDER — VANCOMYCIN HCL 10 G IV SOLR
1500.0000 mg | Freq: Once | INTRAVENOUS | Status: DC
  Filled 2019-08-24: qty 1500

## 2019-08-24 MED ORDER — VANCOMYCIN HCL 1.5 G IV SOLR
1500.0000 mg | Freq: Once | INTRAVENOUS | Status: AC
  Administered 2019-08-24: 1500 mg via INTRAVENOUS
  Filled 2019-08-24: qty 1500

## 2019-08-24 MED ORDER — TRAZODONE HCL 50 MG PO TABS
25.0000 mg | ORAL_TABLET | ORAL | Status: DC
  Administered 2019-08-24 – 2019-08-26 (×3): 50 mg via ORAL
  Filled 2019-08-24 (×3): qty 1

## 2019-08-24 MED ORDER — ACETAMINOPHEN 650 MG RE SUPP: 650.0000 mg | Freq: Four times a day (QID) | RECTAL | Status: DC | PRN

## 2019-08-24 MED ORDER — NITROGLYCERIN IN D5W 200-5 MCG/ML-% IV SOLN
0.0000 ug/min | INTRAVENOUS | Status: DC
  Administered 2019-08-24: 5 ug/min via INTRAVENOUS
  Filled 2019-08-24: qty 250

## 2019-08-24 MED ORDER — CLOPIDOGREL BISULFATE 75 MG PO TABS
75.0000 mg | ORAL_TABLET | Freq: Every day | ORAL | Status: DC
  Administered 2019-08-25 – 2019-08-27 (×3): 75 mg via ORAL
  Filled 2019-08-24 (×2): qty 1

## 2019-08-24 MED ORDER — CHLORHEXIDINE GLUCONATE CLOTH 2 % EX PADS
6.0000 | MEDICATED_PAD | Freq: Every day | CUTANEOUS | Status: DC
  Administered 2019-08-24 – 2019-08-26 (×2): 6 via TOPICAL

## 2019-08-24 MED ORDER — CARVEDILOL 3.125 MG PO TABS
3.1250 mg | ORAL_TABLET | Freq: Two times a day (BID) | ORAL | Status: DC
  Administered 2019-08-25 – 2019-08-26 (×2): 3.125 mg via ORAL
  Filled 2019-08-24: qty 1

## 2019-08-24 MED ORDER — ASPIRIN 81 MG PO CHEW
81.0000 mg | CHEWABLE_TABLET | Freq: Every day | ORAL | Status: DC
  Administered 2019-08-25 – 2019-08-27 (×3): 81 mg via ORAL
  Filled 2019-08-24 (×3): qty 1

## 2019-08-24 MED ORDER — SACUBITRIL-VALSARTAN 24-26 MG PO TABS
1.0000 | ORAL_TABLET | Freq: Two times a day (BID) | ORAL | Status: DC
  Administered 2019-08-24 – 2019-08-27 (×6): 1 via ORAL
  Filled 2019-08-24 (×9): qty 1

## 2019-08-24 MED ORDER — SODIUM CHLORIDE 0.9 % IV SOLN
500.0000 mg | INTRAVENOUS | Status: DC
  Administered 2019-08-24: 500 mg via INTRAVENOUS
  Filled 2019-08-24 (×2): qty 500

## 2019-08-24 MED ORDER — SODIUM CHLORIDE 0.9 % IV SOLN: 250.0000 mL | INTRAVENOUS | Status: DC | PRN

## 2019-08-24 MED ORDER — ONDANSETRON HCL 4 MG PO TABS: 4.0000 mg | ORAL_TABLET | Freq: Four times a day (QID) | ORAL | Status: DC | PRN

## 2019-08-24 MED ORDER — ORAL CARE MOUTH RINSE
15.0000 mL | Freq: Two times a day (BID) | OROMUCOSAL | Status: DC
  Administered 2019-08-24 (×2): 15 mL via OROMUCOSAL

## 2019-08-24 MED ORDER — PANTOPRAZOLE SODIUM 40 MG PO TBEC
40.0000 mg | DELAYED_RELEASE_TABLET | Freq: Every day | ORAL | Status: DC
  Administered 2019-08-25 – 2019-08-27 (×3): 40 mg via ORAL
  Filled 2019-08-24 (×2): qty 1

## 2019-08-24 MED ORDER — FUROSEMIDE 10 MG/ML IJ SOLN
40.0000 mg | Freq: Once | INTRAMUSCULAR | Status: AC
  Administered 2019-08-24: 40 mg via INTRAVENOUS
  Filled 2019-08-24: qty 4

## 2019-08-24 MED ORDER — VANCOMYCIN HCL IN DEXTROSE 1-5 GM/200ML-% IV SOLN: 1000.0000 mg | Freq: Once | INTRAVENOUS | Status: DC

## 2019-08-24 MED ORDER — ESCITALOPRAM OXALATE 10 MG PO TABS
10.0000 mg | ORAL_TABLET | Freq: Every day | ORAL | Status: DC
  Administered 2019-08-25 – 2019-08-27 (×3): 10 mg via ORAL
  Filled 2019-08-24 (×4): qty 1

## 2019-08-24 MED ORDER — ATORVASTATIN CALCIUM 20 MG PO TABS
40.0000 mg | ORAL_TABLET | Freq: Every day | ORAL | Status: DC
  Administered 2019-08-25 – 2019-08-27 (×3): 40 mg via ORAL
  Filled 2019-08-24 (×3): qty 2

## 2019-08-24 MED ORDER — SODIUM CHLORIDE 0.9 % IV SOLN
2.0000 g | Freq: Once | INTRAVENOUS | Status: AC
  Administered 2019-08-24: 2 g via INTRAVENOUS
  Filled 2019-08-24: qty 2

## 2019-08-24 MED ORDER — SODIUM CHLORIDE 0.9% FLUSH
3.0000 mL | Freq: Two times a day (BID) | INTRAVENOUS | Status: DC
  Administered 2019-08-24 – 2019-08-26 (×5): 3 mL via INTRAVENOUS

## 2019-08-24 MED ORDER — ALPRAZOLAM 0.25 MG PO TABS
0.2500 mg | ORAL_TABLET | Freq: Every day | ORAL | Status: DC
  Administered 2019-08-24: 0.25 mg via ORAL
  Administered 2019-08-25 – 2019-08-26 (×2): 0.5 mg via ORAL
  Filled 2019-08-24: qty 2
  Filled 2019-08-24: qty 1
  Filled 2019-08-24: qty 2

## 2019-08-24 MED ORDER — SENNOSIDES-DOCUSATE SODIUM 8.6-50 MG PO TABS: 1.0000 | ORAL_TABLET | Freq: Every evening | ORAL | Status: DC | PRN

## 2019-08-24 MED ORDER — SPIRONOLACTONE 25 MG PO TABS
12.5000 mg | ORAL_TABLET | Freq: Every day | ORAL | Status: DC
  Administered 2019-08-26: 12.5 mg via ORAL
  Filled 2019-08-24 (×4): qty 0.5

## 2019-08-24 MED ORDER — ONDANSETRON HCL 4 MG/2ML IJ SOLN: 4.0000 mg | Freq: Four times a day (QID) | INTRAMUSCULAR | Status: DC | PRN

## 2019-08-24 MED ORDER — NITROGLYCERIN 0.4 MG SL SUBL
0.4000 mg | SUBLINGUAL_TABLET | Freq: Once | SUBLINGUAL | Status: AC
  Administered 2019-08-24: 0.4 mg via SUBLINGUAL
  Filled 2019-08-24: qty 1

## 2019-08-24 MED ORDER — POTASSIUM CHLORIDE CRYS ER 20 MEQ PO TBCR
20.0000 meq | EXTENDED_RELEASE_TABLET | Freq: Two times a day (BID) | ORAL | Status: DC
  Administered 2019-08-24 – 2019-08-27 (×6): 20 meq via ORAL
  Filled 2019-08-24 (×5): qty 1

## 2019-08-24 MED ORDER — IPRATROPIUM-ALBUTEROL 0.5-2.5 (3) MG/3ML IN SOLN
3.0000 mL | Freq: Four times a day (QID) | RESPIRATORY_TRACT | Status: DC
  Administered 2019-08-24 – 2019-08-26 (×7): 3 mL via RESPIRATORY_TRACT
  Filled 2019-08-24 (×7): qty 3

## 2019-08-24 MED ORDER — ENOXAPARIN SODIUM 40 MG/0.4ML ~~LOC~~ SOLN
40.0000 mg | SUBCUTANEOUS | Status: DC
  Administered 2019-08-24: 40 mg via SUBCUTANEOUS
  Filled 2019-08-24: qty 0.4

## 2019-08-24 MED ORDER — FUROSEMIDE 10 MG/ML IJ SOLN
80.0000 mg | Freq: Once | INTRAMUSCULAR | Status: AC
  Administered 2019-08-24: 80 mg via INTRAVENOUS
  Filled 2019-08-24: qty 8

## 2019-08-24 MED ORDER — ASPIRIN 81 MG PO CHEW
324.0000 mg | CHEWABLE_TABLET | Freq: Once | ORAL | Status: AC
  Administered 2019-08-24: 324 mg via ORAL
  Filled 2019-08-24: qty 4

## 2019-08-24 MED ORDER — CHLORHEXIDINE GLUCONATE 0.12 % MT SOLN
15.0000 mL | Freq: Two times a day (BID) | OROMUCOSAL | Status: DC
  Administered 2019-08-24 – 2019-08-26 (×5): 15 mL via OROMUCOSAL
  Filled 2019-08-24 (×3): qty 15

## 2019-08-24 MED ORDER — SODIUM CHLORIDE 0.9 % IV SOLN
1.0000 g | INTRAVENOUS | Status: DC
  Administered 2019-08-24 – 2019-08-25 (×2): 1 g via INTRAVENOUS
  Filled 2019-08-24: qty 10
  Filled 2019-08-24 (×2): qty 1
  Filled 2019-08-24: qty 10

## 2019-08-24 MED ORDER — SODIUM CHLORIDE 0.9 % IV SOLN
2.0000 g | Freq: Two times a day (BID) | INTRAVENOUS | Status: DC
  Filled 2019-08-24: qty 2

## 2019-08-24 MED ORDER — VANCOMYCIN HCL IN DEXTROSE 1-5 GM/200ML-% IV SOLN: 1000.0000 mg | INTRAVENOUS | Status: DC

## 2019-08-24 MED ORDER — FUROSEMIDE 10 MG/ML IJ SOLN
60.0000 mg | Freq: Two times a day (BID) | INTRAMUSCULAR | Status: DC
  Administered 2019-08-24 – 2019-08-25 (×2): 60 mg via INTRAVENOUS
  Filled 2019-08-24 (×2): qty 6

## 2019-08-24 MED ORDER — SODIUM CHLORIDE 0.9% FLUSH: 3.0000 mL | INTRAVENOUS | Status: DC | PRN

## 2019-08-24 NOTE — ED Notes (Signed)
Spoke with pharmacy. Changing Vancomycin order to 1500mg  from 1000mg . Will come from pharmacy.

## 2019-08-24 NOTE — Progress Notes (Signed)
Pharmacy Antibiotic Note  Ashley [name] is a 68 y.o. female admitted on 08/24/2019 with pneumonia.  Pharmacy has been consulted for cefepime and vancomycin dosing.  Plan: Cefepime 2 g IV q12h based on current renal function  Vancomycin 1500 mg IV x1 loading dose (called ED RN) Vancomycin 1000 mg IV Q 24 hrs. Goal AUC 400-550. Expected AUC: 475 Expected Cmin 12.4 SCr used: 1.13  Will order MRSA PCR to help guide therapy Will order SCr for AM to continue to monitor renal function    Height: 5\' 4"  (162.6 cm) Weight: 163 lb 2.3 oz (74 kg) IBW/kg (Calculated) : 54.7  Temp (24hrs), Avg:97.1 F (36.2 C), Min:97.1 F (36.2 C), Max:97.1 F (36.2 C)  Recent Labs  Lab 08/24/19 0906 08/24/19 1006  WBC 23.0*  --   CREATININE 1.13*  --   LATICACIDVEN  --  3.4*    Estimated Creatinine Clearance: 48.2 mL/min (A) (by C-G formula based on SCr of 1.13 mg/dL (H)).    Allergies  Allergen Reactions  . Hydrocodone-Acetaminophen Nausea Only    Antimicrobials this admission: Cefepime/vanc 10/18 >>  Dose adjustments this admission:   Microbiology results:  MRSA PCR: ordered  Thank you for allowing pharmacy to be a part of this patient's care.  Rocky Morel 08/24/2019 12:01 PM

## 2019-08-24 NOTE — H&P (Signed)
[facility] NAME: Ashley [name]    MR#:  [id number]  DATE OF BIRTH:  [birth year]  DATE OF ADMISSION:  08/24/2019  PRIMARY CARE PHYSICIAN: [name], MD   REQUESTING/REFERRING PHYSICIAN:   CHIEF COMPLAINT:   Chief Complaint  Patient presents with  . Shortness of Breath    HISTORY OF PRESENT ILLNESS: Ashley [name]  is a 68 y.o. female with a known history of AICD, anemia, coronary artery disease, CABG, chronic congestive heart failure, colon cancer, chronic kidney disease stage III, GERD, hyperlipidemia, hypertension, mitral valve repair, pacemaker presented to the emergency room for shortness of breath.  Patient had been short of breath since 1 day.  No fever or cough.  No recent travel.  COVID-19 test was negative.  Chest x-ray showed flash pulmonary female patient was given IV Lasix for diuresis started on nitroglycerin drip.  And patient presented to emergency room she was short of breath hypoxic put on BiPAP and stabilized.  Hospitalist service was consulted for further care. IPAP 12 EPAP 6 Rate 12 FiO2 40% Evaluation in the emergency room showed elevated lactic acid level.  Patient also has WBC code and broad-spectrum antibiotics started.  PAST MEDICAL HISTORY:   Past Medical History:  Diagnosis Date  . AICD (automatic cardioverter/defibrillator) present    on right side  . Anemia   . CAD (coronary artery disease)    s/p CABG  . CHF (congestive heart failure) ([place])   . Chronic kidney disease    renal artery stenosis  . Colon cancer ([place])   . Depression   . GERD (gastroesophageal reflux disease)   . Hx of colonic polyps   . Hyperlipidemia   . Hypertension   . Mitral valve disorder    s/p mitral valve repair wth CABG  . Myocardial infarction ([place])   . Peripheral vascular disease ([place])   . Presence of permanent cardiac pacemaker    Pacemaker/ Defibrillator    PAST SURGICAL HISTORY:  Past Surgical  History:  Procedure Laterality Date  . arm surgery     fracture, has plates and screws  . CABG with mitral valve repair    . CARDIAC CATHETERIZATION    . COLON SURGERY     colon cancer  . COLONOSCOPY WITH PROPOFOL N/A 10/09/2016   Procedure: COLONOSCOPY WITH PROPOFOL;  Surgeon: Jonathon Bellows, MD;  Location: [facility] ENDOSCOPY;  Service: Endoscopy;  Laterality: N/A;  . COLONOSCOPY WITH PROPOFOL N/A 07/24/2018   Procedure: COLONOSCOPY WITH PROPOFOL;  Surgeon: Virgel Manifold, MD;  Location: [facility] ENDOSCOPY;  Service: Endoscopy;  Laterality: N/A;  . CORONARY ARTERY BYPASS GRAFT    . ENTEROSCOPY N/A 11/21/2018   Procedure: ENTEROSCOPY-BALLOON;  Surgeon: Jonathon Bellows, MD;  [facility] ENDOSCOPY;  Service: Gastroenterology;  Laterality: N/A;  . ESOPHAGOGASTRODUODENOSCOPY (EGD) WITH PROPOFOL N/A 07/24/2018   Procedure: ESOPHAGOGASTRODUODENOSCOPY (EGD) WITH PROPOFOL;  Surgeon: Virgel Manifold, MD;  Location: [facility] ENDOSCOPY;  Service: Endoscopy;  Laterality: N/A;  . GIVENS CAPSULE STUDY N/A 08/16/2018   Procedure: GIVENS CAPSULE STUDY;  Surgeon: Virgel Manifold, MD;  Location: [facility] ENDOSCOPY;  Service: Endoscopy;  Laterality: N/A;  . pace maker defib  2016  . PERIPHERAL VASCULAR CATHETERIZATION N/A 10/23/2016   Procedure: Renal Angiography;  Surgeon: Algernon Huxley, MD;  Location: [facility];  Service: Cardiovascular;  Laterality: N/A;  . TOTAL ABDOMINAL HYSTERECTOMY  1999   history of abnormal pap    SOCIAL HISTORY:  Social History  Tobacco Use  . Smoking status: Former Smoker    Packs/day: 0.25    Types: Cigarettes    Quit date: 06/19/2019    Years since quitting: 0.1  . Smokeless tobacco: Never Used  . Tobacco comment: reports smoking 2-3 cigarettes/ day  Substance Use Topics  . Alcohol use: Yes    Alcohol/week: 2.0 standard drinks    Types: 2 Shots of liquor per week    Comment: occasion    FAMILY HISTORY:  Family History  Problem Relation Age of Onset  .  Arthritis Mother   . Cancer Mother        uterus cancer  . Hyperlipidemia Mother   . Hypertension Mother   . Heart disease Mother   . Diabetes Mother   . Hyperlipidemia Father   . Hypertension Father   . Heart disease Father   . Diabetes Father   . Cancer Sister        ovary cancer  . Diabetes Maternal Grandmother   . Hypertension Maternal Grandmother   . Arthritis Maternal Grandmother   . Hypertension Maternal Grandfather   . Hypertension Paternal Grandmother   . Hypertension Paternal Grandfather   . Heart disease Paternal Grandfather   . Heart disease Brother   . Kidney disease Brother   . Breast cancer Neg Hx     DRUG ALLERGIES:  Allergies  Allergen Reactions  . Hydrocodone-Acetaminophen Nausea Only    REVIEW OF SYSTEMS:   CONSTITUTIONAL: No fever, fatigue or weakness.  EYES: No blurred or double vision.  EARS, NOSE, AND THROAT: No tinnitus or ear pain.  RESPIRATORY: No cough, has shortness of breath, no wheezing or hemoptysis.  CARDIOVASCULAR: No chest pain, orthopnea, edema.  GASTROINTESTINAL: No nausea, vomiting, diarrhea or abdominal pain.  GENITOURINARY: No dysuria, hematuria.  ENDOCRINE: No polyuria, nocturia,  HEMATOLOGY: No anemia, easy bruising or bleeding SKIN: No rash or lesion. MUSCULOSKELETAL: No joint pain or arthritis.   NEUROLOGIC: No tingling, numbness, weakness.  PSYCHIATRY: No anxiety or depression.   MEDICATIONS AT HOME:  Prior to Admission medications   Medication Sig Start Date End Date Taking? Authorizing Provider  acetaminophen (TYLENOL) 500 MG tablet Maximum 6 tablets a day. Patient taking differently: Take 500 mg by mouth every 4 (four) hours as needed for fever. Maximum 6 tablets a day. 02/09/15  Yes Doss, Velora Heckler, RN  albuterol (VENTOLIN HFA) 108 (90 Base) MCG/ACT inhaler INHALE 2 PUFFS BY MOUTH INTO THE LUNGS EVERY 6 HOURS AS NEEDED FOR WHEEZING Patient taking differently: Inhale 2 puffs into the lungs every 6 (six) hours as needed  for wheezing or shortness of breath. INHALE 2 PUFFS BY MOUTH INTO THE LUNGS EVERY 6 HOURS AS NEEDED FOR WHEEZING 06/03/19  Yes [name], MD  ALPRAZolam Duanne Moron) 0.5 MG tablet Take 0.5-1 tablets (0.25-0.5 mg total) by mouth at bedtime. TAKE ONE TABLET BY MOUTH AT BEDTIME AS NEEDED FOR ANXIETY 02/24/19  Yes [name], MD  aspirin 81 MG tablet Take 81 mg by mouth daily.   Yes [provider]  atorvastatin (LIPITOR) 40 MG tablet TAKE ONE TABLET BY MOUTH ONCE DAILY Patient taking differently: Take 40 mg by mouth daily.  07/25/19  Yes [name], MD  carvedilol (COREG) 3.125 MG tablet Take 1 tablet (3.125 mg total) by mouth 2 (two) times daily with a meal. 07/19/19  Yes Wieting, Richard, MD  clopidogrel (PLAVIX) 75 MG tablet Take 1 tablet (75 mg total) by mouth daily. 06/03/19  Yes [name],  MD  cyanocobalamin (,VITAMIN B-12,) 1000 MCG/ML injection Inject 1 mL weekly into the muscle monthly Patient taking differently: Inject 1,000 mcg into the muscle every 30 (thirty) days.  11/08/18  Yes [name], MD  diphenhydrAMINE (DIPHENHIST) 25 mg capsule Take 25 mg by mouth at bedtime as needed for allergies or sleep.    Yes [provider]  escitalopram (LEXAPRO) 10 MG tablet Take 1 tablet (10 mg total) by mouth daily. 06/03/19  Yes [name], MD  ferrous sulfate 325 (65 FE) MG EC tablet Take 325 mg by mouth daily with breakfast.   Yes [provider]  Fluticasone-Umeclidin-Vilant (TRELEGY ELLIPTA) 100-62.5-25 MCG/INH AEPB Inhale 1 puff into the lungs daily.   Yes [provider]  furosemide (LASIX) 40 MG tablet One tablet twice a day Patient taking differently: Take 40 mg by mouth 2 (two) times daily.  07/19/19  Yes Wieting, Richard, MD  ipratropium-albuterol (DUONEB) 0.5-2.5 (3) MG/3ML SOLN Take 3 mLs by nebulization every 6 (six) hours as needed. Patient taking differently: Take 3 mLs by nebulization every 6 (six) hours as needed (respiratory  symptoms).  08/05/18  Yes [name], MD  nitroGLYCERIN (NITROSTAT) 0.4 MG SL tablet Place 1 tablet (0.4 mg total) under the tongue every 5 (five) minutes as needed for chest pain. 06/19/16  Yes [name], MD  omeprazole (PRILOSEC) 40 MG capsule Take 1 capsule (40 mg total) by mouth daily. 06/03/19  Yes [name], MD  potassium chloride (K-DUR) 10 MEQ tablet Take 1 tablet (10 mEq total) by mouth daily. 06/03/19  Yes [name], MD  sacubitril-valsartan (ENTRESTO) 24-26 MG Take 1 tablet by mouth 2 (two) times daily. 07/19/19  Yes Wieting, Richard, MD  spironolactone (ALDACTONE) 25 MG tablet Take 0.5 tablets (12.5 mg total) by mouth daily. 07/28/19 07/27/20 Yes [name], MD  traZODone (DESYREL) 50 MG tablet Take 0.5-1 tablets (25-50 mg total) by mouth daily. One hour before bedtime 06/03/19  Yes [name], MD      PHYSICAL EXAMINATION:   VITAL SIGNS: Blood pressure (!) 167/109, pulse (!) 109, temperature (!) 97.1 F (36.2 C), temperature source Axillary, resp. rate 19, height 5\' 4"  (1.626 m), weight 74 kg, SpO2 99 %.  GENERAL:  68 y.o.-year-old patient lying in the bed on BiPAP for respiratory distress EYES: Pupils equal, round, reactive to light and accommodation. No scleral icterus. Extraocular muscles intact.  HEENT: Head atraumatic, normocephalic. Oropharynx and nasopharynx clear.  NECK:  Supple, no jugular venous distention. No thyroid enlargement, no tenderness.  LUNGS: Dereased breath sounds bilaterally bibasilar crepitations heard On BiPAP CARDIOVASCULAR: S1, S2 tachycardia noted. No murmurs, rubs, or gallops.  ABDOMEN: Soft, nontender, nondistended. Bowel sounds present. No organomegaly or mass.  EXTREMITIES: No pedal edema, cyanosis, or clubbing.  NEUROLOGIC: Cranial nerves II through XII are intact. Muscle strength 5/5 in all extremities. Sensation intact. Gait not checked.  PSYCHIATRIC: The patient is alert and oriented x 3.  SKIN: No obvious rash,  lesion, or ulcer.   LABORATORY PANEL:   CBC Recent Labs  Lab 08/24/19 0906  WBC 23.0*  HGB 13.8  HCT 41.7  PLT 463*  MCV 95.4  MCH 31.6  MCHC 33.1  RDW 15.3  LYMPHSABS 2.4  MONOABS 1.3*  EOSABS 0.3  BASOSABS 0.2*   ------------------------------------------------------------------------------------------------------------------  Chemistries  Recent Labs  Lab 08/24/19 0906  NA 134*  K 5.0  CL 99  CO2 21*  GLUCOSE 379*  BUN 21  CREATININE  1.13*  CALCIUM 9.0  AST 20  ALT 16  ALKPHOS 92  BILITOT 0.9   ------------------------------------------------------------------------------------------------------------------ estimated creatinine clearance is 48.2 mL/min (A) (by C-G formula based on SCr of 1.13 mg/dL (H)). ------------------------------------------------------------------------------------------------------------------ No results for input(s): TSH, T4TOTAL, T3FREE, THYROIDAB in the last 72 hours.  Invalid input(s): FREET3   Coagulation profile Recent Labs  Lab 08/24/19 0906  INR 1.1   ------------------------------------------------------------------------------------------------------------------- No results for input(s): DDIMER in the last 72 hours. -------------------------------------------------------------------------------------------------------------------  Cardiac Enzymes No results for input(s): CKMB, TROPONINI, MYOGLOBIN in the last 168 hours.  Invalid input(s): CK ------------------------------------------------------------------------------------------------------------------ Invalid input(s): POCBNP  ---------------------------------------------------------------------------------------------------------------  Urinalysis    Component Value Date/Time   COLORURINE YELLOW (A) 05/29/2019 0810   APPEARANCEUR CLEAR (A) 05/29/2019 0810   APPEARANCEUR Clear 08/19/2014 0025   LABSPEC 1.006 05/29/2019 0810   LABSPEC 1.009 08/19/2014  0025   PHURINE 6.0 05/29/2019 0810   GLUCOSEU NEGATIVE 05/29/2019 0810   GLUCOSEU 50 mg/dL 08/19/2014 0025   HGBUR SMALL (A) 05/29/2019 0810   BILIRUBINUR NEGATIVE 05/29/2019 0810   BILIRUBINUR Negative 08/19/2014 0025   KETONESUR NEGATIVE 05/29/2019 0810   PROTEINUR NEGATIVE 05/29/2019 0810   NITRITE NEGATIVE 05/29/2019 0810   LEUKOCYTESUR NEGATIVE 05/29/2019 0810   LEUKOCYTESUR Negative 08/19/2014 0025     RADIOLOGY: Dg Chest Port 1 View  Result Date: 08/24/2019 CLINICAL DATA:  Shortness of breath EXAM: PORTABLE CHEST 1 VIEW COMPARISON:  08/12/2019 FINDINGS: Cardiomegaly with mild interstitial edema, similar to the prior. No definite pleural effusions. No pneumothorax. Right subclavian ICD.  Postsurgical changes related to prior CABG. Median sternotomy. Status post ORIF of the left clavicle. IMPRESSION: Cardiomegaly with mild interstitial edema. Electronically Signed   By: Julian Hy M.D.   On: 08/24/2019 09:22    EKG: Orders placed or performed during the hospital encounter of 08/24/19  . EKG 12-Lead  . EKG 12-Lead  . ED EKG  . ED EKG  . EKG 12-Lead  . EKG 12-Lead    IMPRESSION AND PLAN: 68 year old female patient with a known history of AICD, anemia, coronary artery disease, CABG, chronic congestive heart failure, colon cancer, chronic kidney disease stage III, GERD, hyperlipidemia, hypertension, mitral valve repair, pacemaker presented to the emergency room for shortness of breath.  Patient had been short of breath since 1 day.  No fever or cough.  No recent travel.  COVID-19 test was negative.    -Acute respiratory failure with hypoxia Continue BiPAP for respiratory failure Intensivist consultation And ablation therapy  -Acute pulmonary edema IV Lasix for diuresis IV nitroglycerin drip  -Sepsis Broad-spectrum vancomycin and cefepime antibiotics Follow-up WBC count and cultures  -Acute on chronic congestive heart failure exacerbation IV Lasix for  diuresis Check echocardiogram and cardiology evaluation  -CKD stage III Monitor renal function Avoid nephrotoxic medication  All the records are reviewed and case discussed with ED provider. Management plans discussed with the patient, family and they are in agreement.  CODE STATUS:Full code Code Status History    Date Active Date Inactive Code Status Order ID Comments User Context   08/12/2019 0953 08/15/2019 1503 Full Code [id number]  Dustin Flock, MD ED   07/16/2019 1508 07/19/2019 1445 Full Code [id number]  Hillary Bow, MD ED   06/19/2019 1430 06/21/2019 1804 Full Code [id number]  Henreitta Leber, MD ED   05/27/2019 0630 05/31/2019 2111 Full Code [id number]  Mansy, Arvella Merles, MD ED   03/16/2019 1354 03/17/2019 1617 Full Code [id number]  Gorden Harms, MD Inpatient   07/17/2018 1214 07/19/2018 1548  Full Code [id number]  Dustin Flock, MD ED   10/23/2016 715 517 1001 10/23/2016 1630 Full Code [id number]  Algernon Huxley, MD Inpatient   06/21/2016 0437 06/21/2016 1758 Full Code [id number]  Harrie Foreman, MD Inpatient   Advance Care Planning Activity       TOTAL CRITICAL CARE TIME TAKING CARE OF THIS PATIENT: 55 minutes.    Saundra Shelling M.D on 08/24/2019 at 12:20 PM  Between 7am to 6pm - Pager - [phone]  After 6pm go to [email or web address] - password [email or web address] [facility]  Office  [phone]  CC: Primary care physician; [name], MD

## 2019-08-24 NOTE — Consult Note (Signed)
CRITICAL CARE PROGRESS NOTE    Name: Ashley [name] MRN: [id number] DOB: 08-03-1951     LOS: 0   SUBJECTIVE FINDINGS & SIGNIFICANT EVENTS   Patient description:  This is a 68 year old female with a history of systolic CHF status post AICD, CAD, PAD, CKD, with renovascular hypertension, advanced COPD, GERD, dyslipidemia, tobacco abuse, Barrett's esophagus, colonic polyps with history of colon CA, brought into the ED with acute shortness of breath, had COVID-19 testing which was negative, she reports that dyspnea on exertion and shortness of breath started approximately 1 day prior to admission she denies any recent travel or sick contacts, she was found to be afebrile, she had a chest x-ray done which shows bilateral interstitial opacification consistent with pulmonary edema, was also noted to have accelerated hypertension.  She was placed on BiPAP initially admitted to hospitalist service.  Dr. Estanislado Pandy place consult for critical care due to worsening hypoxemia on BiPAP.   Lines / Drains: Peripheral IV  Cultures / Sepsis markers: Nasal MRSA pending Pro calcitonin less than 0.10  Antibiotics: Vancomycin and cefepime empirically-we will stop due to noted reaction to vancomycin, patient reports previous similar reaction.  -Initiating Rocephin and Zithromax empirically  Protocols / Consultants: Hospitalist and critical care  Tests / Events: Troponin mildly elevated at 24    PAST MEDICAL HISTORY   Past Medical History:  Diagnosis Date  . AICD (automatic cardioverter/defibrillator) present    on right side  . Anemia   . CAD (coronary artery disease)    s/p CABG  . CHF (congestive heart failure) ([place])   . Chronic kidney disease    renal artery stenosis  . Colon cancer ([place])   . Depression   . GERD  (gastroesophageal reflux disease)   . Hx of colonic polyps   . Hyperlipidemia   . Hypertension   . Mitral valve disorder    s/p mitral valve repair wth CABG  . Myocardial infarction ([place])   . Peripheral vascular disease ([place])   . Presence of permanent cardiac pacemaker    Pacemaker/ Defibrillator     SURGICAL HISTORY   Past Surgical History:  Procedure Laterality Date  . arm surgery     fracture, has plates and screws  . CABG with mitral valve repair    . CARDIAC CATHETERIZATION    . COLON SURGERY     colon cancer  . COLONOSCOPY WITH PROPOFOL N/A 10/09/2016   Procedure: COLONOSCOPY WITH PROPOFOL;  Surgeon: Jonathon Bellows, MD;  Location: [facility] ENDOSCOPY;  Service: Endoscopy;  Laterality: N/A;  . COLONOSCOPY WITH PROPOFOL N/A 07/24/2018   Procedure: COLONOSCOPY WITH PROPOFOL;  Surgeon: Virgel Manifold, MD;  Location: [facility] ENDOSCOPY;  Service: Endoscopy;  Laterality: N/A;  . CORONARY ARTERY BYPASS GRAFT    . ENTEROSCOPY N/A 11/21/2018   Procedure: ENTEROSCOPY-BALLOON;  Surgeon: Jonathon Bellows, MD;  Location[facility] ENDOSCOPY;  Service: Gastroenterology;  Laterality: N/A;  . ESOPHAGOGASTRODUODENOSCOPY (EGD) WITH PROPOFOL N/A 07/24/2018   Procedure: ESOPHAGOGASTRODUODENOSCOPY (EGD) WITH PROPOFOL;  Surgeon: Virgel Manifold, MD;  Location: [facility] ENDOSCOPY;  Service: Endoscopy;  Laterality: N/A;  . GIVENS CAPSULE STUDY N/A 08/16/2018   Procedure: GIVENS CAPSULE STUDY;  Surgeon: Virgel Manifold, MD;  Location: [facility] ENDOSCOPY;  Service: Endoscopy;  Laterality: N/A;  . pace maker defib  2016  . PERIPHERAL VASCULAR CATHETERIZATION N/A 10/23/2016   Procedure: Renal Angiography;  Surgeon: Algernon Huxley, MD;  Location: [place] [facility] LAB;  Service: Cardiovascular;  Laterality: N/A;  . TOTAL ABDOMINAL HYSTERECTOMY  1999   history of abnormal pap     FAMILY HISTORY   Family History  Problem Relation Age of Onset  . Arthritis Mother   . Cancer Mother        uterus cancer  .  Hyperlipidemia Mother   . Hypertension Mother   . Heart disease Mother   . Diabetes Mother   . Hyperlipidemia Father   . Hypertension Father   . Heart disease Father   . Diabetes Father   . Cancer Sister        ovary cancer  . Diabetes Maternal Grandmother   . Hypertension Maternal Grandmother   . Arthritis Maternal Grandmother   . Hypertension Maternal Grandfather   . Hypertension Paternal Grandmother   . Hypertension Paternal Grandfather   . Heart disease Paternal Grandfather   . Heart disease Brother   . Kidney disease Brother   . Breast cancer Neg Hx      SOCIAL HISTORY   Social History   Tobacco Use  . Smoking status: Former Smoker    Packs/day: 0.25    Types: Cigarettes    Quit date: 06/19/2019    Years since quitting: 0.1  . Smokeless tobacco: Never Used  . Tobacco comment: reports smoking 2-3 cigarettes/ day  Substance Use Topics  . Alcohol use: Yes    Alcohol/week: 2.0 standard drinks    Types: 2 Shots of liquor per week    Comment: occasion  . Drug use: No     MEDICATIONS   Current Medication:  Current Facility-Administered Medications:  .  0.9 %  sodium chloride infusion, 250 mL, Intravenous, PRN, Pyreddy, Pavan, MD .  acetaminophen (TYLENOL) tablet 650 mg, 650 mg, Oral, Q6H PRN **OR** acetaminophen (TYLENOL) suppository 650 mg, 650 mg, Rectal, Q6H PRN, Pyreddy, Pavan, MD .  ALPRAZolam Duanne Moron) tablet 0.25-0.5 mg, 0.25-0.5 mg, Oral, QHS, Pyreddy, Pavan, MD .  aspirin chewable tablet 81 mg, 81 mg, Oral, Daily, Pyreddy, Pavan, MD .  atorvastatin (LIPITOR) tablet 40 mg, 40 mg, Oral, Daily, Pyreddy, Pavan, MD .  carvedilol (COREG) tablet 3.125 mg, 3.125 mg, Oral, BID WC, Pyreddy, Pavan, MD .  ceFEPIme (MAXIPIME) 2 g in sodium chloride 0.9 % 100 mL IVPB, 2 g, Intravenous, Q12H, Rocky Morel, RPH .  chlorhexidine (PERIDEX) 0.12 % solution 15 mL, 15 mL, Mouth Rinse, BID, Micca Matura, MD .  Chlorhexidine Gluconate Cloth 2 % PADS 6 each, 6 each, Topical,  Daily, Lanney Gins, Arieh Bogue, MD .  clopidogrel (PLAVIX) tablet 75 mg, 75 mg, Oral, Daily, Pyreddy, Pavan, MD .  enoxaparin (LOVENOX) injection 40 mg, 40 mg, Subcutaneous, Q24H, Pyreddy, Pavan, MD .  escitalopram (LEXAPRO) tablet 10 mg, 10 mg, Oral, Daily, Pyreddy, Pavan, MD .  Derrill Memo ON 08/25/2019] ferrous sulfate tablet 325 mg, 325 mg, Oral, Q breakfast, Pyreddy, Pavan, MD .  furosemide (LASIX) injection 60 mg, 60 mg, Intravenous, Q12H, Pyreddy, Pavan, MD .  furosemide (LASIX) injection 80 mg, 80 mg, Intravenous, Once, Jocie Meroney, MD .  ipratropium-albuterol (DUONEB) 0.5-2.5 (3) MG/3ML nebulizer solution 3 mL, 3 mL, Nebulization, Q6H, Pyreddy, Pavan, MD, 3 mL at 08/24/19 1350 .  MEDLINE mouth rinse, 15 mL, Mouth Rinse, q12n4p, Effa Yarrow, MD .  nitroGLYCERIN 50 mg in dextrose 5 % 250 mL (0.2 mg/mL) infusion, 0-200 mcg/min, Intravenous, Continuous, Monks, Damaris Hippo., MD, Last Rate: 3 mL/hr at 08/24/19 1332, 10 mcg/min at 08/24/19 1332 .  ondansetron (ZOFRAN) tablet 4 mg, 4 mg, Oral, Q6H PRN **OR** ondansetron (ZOFRAN) injection 4 mg, 4 mg,  Intravenous, Q6H PRN, Pyreddy, Pavan, MD .  pantoprazole (PROTONIX) EC tablet 40 mg, 40 mg, Oral, Daily, Pyreddy, Pavan, MD .  potassium chloride SA (KLOR-CON) CR tablet 20 mEq, 20 mEq, Oral, BID, Pyreddy, Pavan, MD .  sacubitril-valsartan (ENTRESTO) 24-26 mg per tablet, 1 tablet, Oral, BID, Pyreddy, Pavan, MD .  senna-docusate (Senokot-S) tablet 1 tablet, 1 tablet, Oral, QHS PRN, Pyreddy, Pavan, MD .  sodium chloride flush (NS) 0.9 % injection 3 mL, 3 mL, Intravenous, Q12H, Pyreddy, Pavan, MD .  sodium chloride flush (NS) 0.9 % injection 3 mL, 3 mL, Intravenous, PRN, Pyreddy, Pavan, MD .  spironolactone (ALDACTONE) tablet 12.5 mg, 12.5 mg, Oral, Daily, Pyreddy, Pavan, MD .  traZODone (DESYREL) tablet 25-50 mg, 25-50 mg, Oral, Daily, Pyreddy, Pavan, MD .  vancomycin (VANCOCIN) 1,500 mg in sodium chloride 0.9 % 500 mL IVPB, 1,500 mg, Intravenous, Once, Rocky Morel, RPH, Last Rate: 250 mL/hr at 08/24/19 1353, 1,500 mg at 08/24/19 1353 .  [START ON 08/25/2019] vancomycin (VANCOCIN) IVPB 1000 mg/200 mL premix, 1,000 mg, Intravenous, Q24H, Wang, Hannah L, RPH    ALLERGIES   Hydrocodone-acetaminophen    REVIEW OF SYSTEMS     10 point ROS conducted and is negative except for dyspnea and shortness of breath  PHYSICAL EXAMINATION   Vital Signs: Temp:  [96.6 F (35.9 C)-97.1 F (36.2 C)] 96.6 F (35.9 C) (10/18 1327) Pulse Rate:  [102-114] 106 (10/18 1327) Resp:  [19-32] 22 (10/18 1327) BP: (145-167)/(86-117) 145/86 (10/18 1327) SpO2:  [95 %-100 %] 99 % (10/18 1327) FiO2 (%):  [28 %-60 %] 28 % (10/18 1327) Weight:  [73.7 kg-74 kg] 73.7 kg (10/18 1327)  GENERAL: Moderately ill-appearing HEAD: Normocephalic, atraumatic.  EYES: Pupils equal, round, reactive to light.  No scleral icterus.  MOUTH: Moist mucosal membrane. NECK: Supple. No thyromegaly. No nodules. No JVD.  PULMONARY: Crackles bilaterally with mild rhonchorous breath sounds bilaterally CARDIOVASCULAR: S1 and S2. Regular rate and rhythm. No murmurs, rubs, or gallops.  GASTROINTESTINAL: Soft, nontender, non-distended. No masses. Positive bowel sounds. No hepatosplenomegaly.  MUSCULOSKELETAL: 1+ pitting edema bilaterally NEUROLOGIC: Mild distress due to acute illness SKIN:intact,warm,dry   PERTINENT DATA     Infusions: . sodium chloride    . ceFEPime (MAXIPIME) IV    . nitroGLYCERIN 10 mcg/min (08/24/19 1332)  . vancomycin 1,500 mg (08/24/19 1353)  . [START ON 08/25/2019] vancomycin     Scheduled Medications: . ALPRAZolam  0.25-0.5 mg Oral QHS  . aspirin  81 mg Oral Daily  . atorvastatin  40 mg Oral Daily  . carvedilol  3.125 mg Oral BID WC  . chlorhexidine  15 mL Mouth Rinse BID  . Chlorhexidine Gluconate Cloth  6 each Topical Daily  . clopidogrel  75 mg Oral Daily  . enoxaparin (LOVENOX) injection  40 mg Subcutaneous Q24H  . escitalopram  10 mg Oral Daily   . [START ON 08/25/2019] ferrous sulfate  325 mg Oral Q breakfast  . furosemide  60 mg Intravenous Q12H  . furosemide  80 mg Intravenous Once  . ipratropium-albuterol  3 mL Nebulization Q6H  . mouth rinse  15 mL Mouth Rinse q12n4p  . pantoprazole  40 mg Oral Daily  . potassium chloride  20 mEq Oral BID  . sacubitril-valsartan  1 tablet Oral BID  . sodium chloride flush  3 mL Intravenous Q12H  . spironolactone  12.5 mg Oral Daily  . traZODone  25-50 mg Oral Daily   PRN Medications: sodium chloride, acetaminophen **OR** acetaminophen, ondansetron **  OR** ondansetron (ZOFRAN) IV, senna-docusate, sodium chloride flush Hemodynamic parameters:   Intake/Output: No intake/output data recorded.  Ventilator  Settings: FiO2 (%):  [28 %-60 %] 28 %    LAB RESULTS:  Basic Metabolic Panel: Recent Labs  Lab 08/24/19 0906  NA 134*  K 5.0  CL 99  CO2 21*  GLUCOSE 379*  BUN 21  CREATININE 1.13*  CALCIUM 9.0   Liver Function Tests: Recent Labs  Lab 08/24/19 0906  AST 20  ALT 16  ALKPHOS 92  BILITOT 0.9  PROT 7.4  ALBUMIN 4.1   No results for input(s): LIPASE, AMYLASE in the last 168 hours. No results for input(s): AMMONIA in the last 168 hours. CBC: Recent Labs  Lab 08/24/19 0906  WBC 23.0*  NEUTROABS 18.7*  HGB 13.8  HCT 41.7  MCV 95.4  PLT 463*   Cardiac Enzymes: No results for input(s): CKTOTAL, CKMB, CKMBINDEX, TROPONINI in the last 168 hours. BNP: Invalid input(s): POCBNP CBG: Recent Labs  Lab 08/24/19 1330  GLUCAP 164*     IMAGING RESULTS:  Imaging: Dg Chest Port 1 View  Result Date: 08/24/2019 CLINICAL DATA:  Shortness of breath EXAM: PORTABLE CHEST 1 VIEW COMPARISON:  08/12/2019 FINDINGS: Cardiomegaly with mild interstitial edema, similar to the prior. No definite pleural effusions. No pneumothorax. Right subclavian ICD.  Postsurgical changes related to prior CABG. Median sternotomy. Status post ORIF of the left clavicle. IMPRESSION: Cardiomegaly  with mild interstitial edema. Electronically Signed   By: Julian Hy M.D.   On: 08/24/2019 09:22      ASSESSMENT AND PLAN    -Multidisciplinary rounds held today  Acute Hypoxic Respiratory Failure -Possible flash pulmonary edema secondary to accelerated hypertension -Elevated leukocytosis and mild lactate elevation possible community-acquired pneumonia will cover with empiric Zithromax and Rocephin for now -Procalcitonin is normal we will continue to trend -Respiratory viral panel pending -Patient had reaction to vancomycin will stop for now, no red man signs at this time -Lasix 80 mg IV now with 40 twice daily after -ABG in a.m. continue Full NIV support -Physiotherapy with MetaNeb every 4 hours with saline   Acute on chronic systolic heart failure status post AI CD with EF 40%  -Continue Lasix 40 twice daily -oxygen as needed -Lasix as tolerated ICU monitoring -Continue Entresto, Aldactone  High anion gap metabolic acidosis   -In the context of severe hyperglycemia we will check for beta hydroxybutyrate evaluate DKA  -Insulin GTT  -Diabetic coordinator-follow recommendations   Renal Failure-acute stage II -DC nonessential nephrotoxic medications-lovenox will switch to SCD only  -follow chem 7 -follow UO -continue Foley Catheter-assess need daily    ID -continue IV abx as prescibed -follow up cultures  GI/Nutrition GI PROPHYLAXIS as indicated-protonix DIET-->TF's as tolerated Constipation protocol as indicated  ENDO - ICU hypoglycemic\Hyperglycemia protocol -check FSBS per protocol   ELECTROLYTES -follow labs as needed -replace as needed -pharmacy consultation   DVT/GI PRX ordered -SCDs -d/c lovenox due to aki TRANSFUSIONS AS NEEDED MONITOR FSBS ASSESS the need for LABS as needed   Critical care provider statement:    Critical care time (minutes):  108   Critical care time was exclusive of:  Separately billable procedures and treating  other patients   Critical care was necessary to treat or prevent imminent or life-threatening deterioration of the following conditions:   Acute hypoxemic respiratory failure, AKI, severe hyperglycemia rule out DKA, pulmonary edema, multiple comorbid conditions   Critical care was time spent personally by me on the following activities:  Development of treatment plan with patient or surrogate, discussions with consultants, evaluation of patient's response to treatment, examination of patient, obtaining history from patient or surrogate, ordering and performing treatments and interventions, ordering and review of laboratory studies and re-evaluation of patient's condition.  I assumed direction of critical care for this patient from another provider in my specialty: no    This document was prepared using Dragon voice recognition software and may include unintentional dictation errors.    Ottie Glazier, M.D.  Div[facility] of [place]

## 2019-08-24 NOTE — Progress Notes (Signed)
Vancomycin stopped. Pt showing signs of distress, became nauseous, increased heart rate and SOB. Dr. Lanney Gins notified. Pt symptoms resolves within 2 minutes of onset. No new orders at this time. Vancomycin not restarted. Will continue to monitor.

## 2019-08-24 NOTE — ED Provider Notes (Signed)
[facility] Emergency Department Provider Note  ____________________________________________   First MD Initiated Contact with Patient 08/24/19 0900     (approximate)  I have reviewed the triage vital signs and the nursing notes.  History  Chief Complaint No chief complaint on file.    HPI Ashley [name] is a 68 y.o. female with a history of HF (EF 40 to 45%), COPD, AICD, mitral valve regurgitation who presents the emergency department for acute onset of shortness of breath.  History limited due to patient's respiratory distress.  She is able to tell me that her shortness of breath started acutely this morning.  She denies any fevers, chest pain, or leg swelling.  Per EMS, on initial fire department arrival she was in visible respiratory distress, and immediately placed on CPAP, no room air oxygen saturation was obtained.  On CPAP with EMS, her sats are in the mid to high 90s.  They administered 125 mg Solu-Medrol as well as DuoNeb x 3.   Past Medical Hx Past Medical History:  Diagnosis Date  . AICD (automatic cardioverter/defibrillator) present    on right side  . Anemia   . CAD (coronary artery disease)    s/p CABG  . CHF (congestive heart failure) ([place])   . Chronic kidney disease    renal artery stenosis  . Colon cancer ([place])   . Depression   . GERD (gastroesophageal reflux disease)   . Hx of colonic polyps   . Hyperlipidemia   . Hypertension   . Mitral valve disorder    s/p mitral valve repair wth CABG  . Myocardial infarction ([place])   . Peripheral vascular disease ([place])   . Presence of permanent cardiac pacemaker    Pacemaker/ Defibrillator    Problem List Patient Active Problem List   Diagnosis Date Noted  . Acute CHF ([place]) 08/12/2019  . CHF (congestive heart failure) ([place]) 07/16/2019  . Acute respiratory failure with hypoxia ([place]) 06/19/2019  . Cough with hemoptysis 06/09/2019  . Acute on chronic combined systolic and  diastolic CHF (congestive heart failure) ([place]) 05/27/2019  . Insomnia 05/07/2019  . Hypokalemia 03/23/2019  . Hyperglycemia, drug-induced 03/23/2019  . COVID-19 virus not detected 03/23/2019  . Respiratory failure ([place]) 03/16/2019  . AVM (arteriovenous malformation) of small bowel, acquired   . Anemia, iron deficiency 11/10/2018  . Chronic systolic heart failure ([place]) 11/06/2018  . Rectal polyp   . Benign neoplasm of cecum   . Barrett's esophagus without dysplasia   . Stomach irritation   . Chronic diastolic heart failure ([place]) 07/03/2018  . HTN (hypertension) 07/03/2018  . 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 B12 deficiency anemia 06/28/2018  . Hypotension 05/11/2018  . Prediabetes 05/11/2018  . Personal history of colon cancer   . Benign neoplasm of descending colon   . Polyp of sigmoid colon   . Benign neoplasm of transverse colon   . Diverticulosis of large intestine without diverticulitis   . 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 Renal artery stenosis ([place]) 10/03/2016  . 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 Hospital discharge follow-up 02/09/2015  . Cough in adult 10/21/2014  . Tobacco abuse 10/11/2014  . Tobacco abuse counseling 10/11/2014  . Chronic right hip pain 08/26/2014  . Atherosclerosis of native artery of extremity with intermittent claudication ([place]) 06/18/2013  . Preoperative evaluation to rule out surgical contraindication 06/18/2013  . CAD (coronary artery disease) 06/01/2013  . GERD (gastroesophageal reflux disease) 06/01/2013  . Hypercholesterolemia 06/01/2013  .  Tubular adenoma of colon 06/01/2013  . 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 arm surgery     fracture, has plates and screws  . CABG with mitral valve repair    . CARDIAC CATHETERIZATION    . COLON SURGERY     colon cancer  . COLONOSCOPY WITH PROPOFOL N/A  10/09/2016   Procedure: COLONOSCOPY WITH PROPOFOL;  Surgeon: Jonathon Bellows, MD;  Location: [facility] ENDOSCOPY;  Service: Endoscopy;  Laterality: N/A;  . COLONOSCOPY WITH PROPOFOL N/A 07/24/2018   Procedure: COLONOSCOPY WITH PROPOFOL;  Surgeon: Virgel Manifold, MD;  Location: [facility] ENDOSCOPY;  Service: Endoscopy;  Laterality: N/A;  . CORONARY ARTERY BYPASS GRAFT    . ENTEROSCOPY N/A 11/21/2018   Procedure: ENTEROSCOPY-BALLOON;  Surgeon: Jonathon Bellows, MD;  Location: [facility] ENDOSCOPY;  Service: Gastroenterology;  Laterality: N/A;  . ESOPHAGOGASTRODUODENOSCOPY (EGD) WITH PROPOFOL N/A 07/24/2018   Procedure: ESOPHAGOGASTRODUODENOSCOPY (EGD) WITH PROPOFOL;  Surgeon: Virgel Manifold, MD;  Location: [facility] ENDOSCOPY;  Service: Endoscopy;  Laterality: N/A;  . GIVENS CAPSULE STUDY N/A 08/16/2018   Procedure: GIVENS CAPSULE STUDY;  Surgeon: Virgel Manifold, MD;  Location: [facility] ENDOSCOPY;  Service: Endoscopy;  Laterality: N/A;  . pace maker defib  2016  . PERIPHERAL VASCULAR CATHETERIZATION N/A 10/23/2016   Procedure: Renal Angiography;  Surgeon: Algernon Huxley, MD;  Location: [facility];  Service: Cardiovascular;  Laterality: N/A;  . TOTAL ABDOMINAL HYSTERECTOMY  1999   history of abnormal pap    Medications Prior to Admission medications   Medication Sig Start Date End Date Taking? Authorizing Provider  acetaminophen (TYLENOL) 500 MG tablet Maximum 6 tablets a day. Patient taking differently: Take 500 mg by mouth every 4 (four) hours as needed for fever. Maximum 6 tablets a day. 02/09/15   Rubbie Battiest, RN  albuterol (VENTOLIN HFA) 108 (90 Base) MCG/ACT inhaler INHALE 2 PUFFS BY MOUTH INTO THE LUNGS EVERY 6 HOURS AS NEEDED FOR WHEEZING Patient taking differently: Inhale 2 puffs into the lungs every 6 (six) hours as needed for wheezing or shortness of breath. INHALE 2 PUFFS BY MOUTH INTO THE LUNGS EVERY 6 HOURS AS NEEDED FOR WHEEZING 06/03/19   [name], MD  ALPRAZolam Duanne Moron) 0.5 MG tablet  Take 0.5-1 tablets (0.25-0.5 mg total) by mouth at bedtime. TAKE ONE TABLET BY MOUTH AT BEDTIME AS NEEDED FOR ANXIETY 02/24/19   [name], MD  aspirin 81 MG tablet Take 81 mg by mouth daily.    [provider]  atorvastatin (LIPITOR) 40 MG tablet TAKE ONE TABLET BY MOUTH ONCE DAILY Patient taking differently: Take 40 mg by mouth daily.  07/25/19   [name], MD  carvedilol (COREG) 3.125 MG tablet Take 1 tablet (3.125 mg total) by mouth 2 (two) times daily with a meal. 07/19/19   Loletha Grayer, MD  clopidogrel (PLAVIX) 75 MG tablet Take 1 tablet (75 mg total) by mouth daily. 06/03/19   [name], MD  cyanocobalamin (,VITAMIN B-12,) 1000 MCG/ML injection Inject 1 mL weekly into the muscle monthly Patient taking differently: Inject 1,000 mcg into the muscle every 30 (thirty) days.  11/08/18   [name], MD  diphenhydrAMINE (DIPHENHIST) 25 mg capsule Take 25 mg by mouth at bedtime as needed for allergies or sleep.     [provider]  escitalopram (LEXAPRO) 10 MG tablet Take 1 tablet (10 mg total) by mouth daily. 06/03/19  [name], MD  ferrous sulfate 325 (65 FE) MG EC tablet Take 325 mg by mouth daily with breakfast.    [provider]  Fluticasone-Umeclidin-Vilant (TRELEGY ELLIPTA) 100-62.5-25 MCG/INH AEPB Inhale 1 puff into the lungs daily.    [provider]  furosemide (LASIX) 40 MG tablet One tablet twice a day Patient taking differently: Take 40 mg by mouth 2 (two) times daily.  07/19/19   Loletha Grayer, MD  ipratropium-albuterol (DUONEB) 0.5-2.5 (3) MG/3ML SOLN Take 3 mLs by nebulization every 6 (six) hours as needed. Patient taking differently: Take 3 mLs by nebulization every 6 (six) hours as needed (respiratory symptoms).  08/05/18   [name], MD  nitroGLYCERIN (NITROSTAT) 0.4 MG SL tablet Place 1 tablet (0.4 mg total) under the tongue every 5 (five) minutes as needed for chest pain. 06/19/16   [name], MD   omeprazole (PRILOSEC) 40 MG capsule Take 1 capsule (40 mg total) by mouth daily. 06/03/19   [name], MD  potassium chloride (K-DUR) 10 MEQ tablet Take 1 tablet (10 mEq total) by mouth daily. 06/03/19   [name], MD  sacubitril-valsartan (ENTRESTO) 24-26 MG Take 1 tablet by mouth 2 (two) times daily. 07/19/19   Loletha Grayer, MD  spironolactone (ALDACTONE) 25 MG tablet Take 0.5 tablets (12.5 mg total) by mouth daily. 07/28/19 07/27/20  [name], MD  traZODone (DESYREL) 50 MG tablet Take 0.5-1 tablets (25-50 mg total) by mouth daily. One hour before bedtime 06/03/19   [name], MD    Allergies Hydrocodone-acetaminophen  Family Hx Family History  Problem Relation Age of Onset  . Arthritis Mother   . Cancer Mother        uterus cancer  . Hyperlipidemia Mother   . Hypertension Mother   . Heart disease Mother   . Diabetes Mother   . Hyperlipidemia Father   . Hypertension Father   . Heart disease Father   . Diabetes Father   . Cancer Sister        ovary cancer  . Diabetes Maternal Grandmother   . Hypertension Maternal Grandmother   . Arthritis Maternal Grandmother   . Hypertension Maternal Grandfather   . Hypertension Paternal Grandmother   . Hypertension Paternal Grandfather   . Heart disease Paternal Grandfather   . Heart disease Brother   . Kidney disease Brother   . Breast cancer Neg Hx     Social Hx Social History   Tobacco Use  . Smoking status: Former Smoker    Packs/day: 0.25    Types: Cigarettes    Quit date: 06/19/2019    Years since quitting: 0.1  . Smokeless tobacco: Never Used  . Tobacco comment: reports smoking 2-3 cigarettes/ day  Substance Use Topics  . Alcohol use: Yes    Alcohol/week: 2.0 standard drinks    Types: 2 Shots of liquor per week    Comment: occasion  . Drug use: No     Review of Systems  Constitutional: Negative for fever, chills. Eyes: Negative for visual changes. ENT: Negative for sore throat.  Cardiovascular: Negative for chest pain. Respiratory: + for shortness of breath. Gastrointestinal: Negative for nausea, vomiting.  Genitourinary: Negative for dysuria. Musculoskeletal: Negative for leg swelling. Skin: Negative for rash. Neurological: Negative for for headaches.   Physical Exam  Vital Signs: ED Triage Vitals  Enc Vitals Group     BP 08/24/19 0900 (!) 161/117     Pulse Rate 08/24/19 0915 (!) 114  Resp 08/24/19 0915 (!) 32     Temp 08/24/19 0932 (!) 97.1 F (36.2 C)     Temp Source 08/24/19 0932 Axillary     SpO2 08/24/19 0915 99 %     Weight 08/24/19 0928 163 lb 2.3 oz (74 kg)     Height 08/24/19 0928 5' 4" (1.626 m)     Head Circumference --      Peak Flow --      Pain Score 08/24/19 0917 0     Pain Loc --      Pain Edu? --      Excl. in [place]? --     Constitutional: Alert and oriented.  In severe acute respiratory distress. Head: Normocephalic. Atraumatic. Eyes: Conjunctivae clear. Sclera anicteric. Nose: No congestion. No rhinorrhea. Mouth/Throat: Wearing CPAP on arrival.  Mucous membranes appear dry. Neck: No stridor.   Cardiovascular: Tachycardic.  Extremities well perfused. Respiratory: Tachypneic, increased work of breathing with accessory muscle use.  On CPAP.  Decreased air movement bilaterally with associated wheezing. Gastrointestinal: Soft. Non-tender. Non-distended.  Musculoskeletal: No lower extremity edema. No deformities. Neurologic:  Normal speech and language. No gross focal neurologic deficits are appreciated.  Skin: Skin is warm, dry and intact. No rash noted. Psychiatric: Mood and affect are appropriate for situation.  EKG  Personally reviewed.   Rate: 120s, tachycardic Rhythm: sinus with some paced beats Axis: normal Intervals: QRS wide 2/2 IVCD IVCD, tachycardic Significant artifact, no Sgarbossa criteria met No STEMI    Radiology  XR: IMPRESSION: Cardiomegaly with mild interstitial edema.     Procedures   Procedure(s) performed (including critical care):  .Critical Care Performed by: Lilia Pro., MD Authorized by: Lilia Pro., MD   Critical care provider statement:    Critical care time (minutes):  45   Critical care was necessary to treat or prevent imminent or life-threatening deterioration of the following conditions:  Respiratory failure   Critical care was time spent personally by me on the following activities:  Discussions with consultants, evaluation of patient's response to treatment, examination of patient, ordering and performing treatments and interventions, ordering and review of laboratory studies, ordering and review of radiographic studies, pulse oximetry, re-evaluation of patient's condition, obtaining history from patient or surrogate and review of old charts     Initial Impression / Assessment and Plan / ED Course  68 y.o. female who presents to the ED for acute onset of SOB, as above. Arrives on CPAP with significant WOB, accessory muscle use, speaking in short sentences. No hypoxia on CPAP  Ddx: suspect like flash, given the acute onset of her symptoms and hypertension on arrival, also consider concomminant COPD exacerbation, pulmonary infection, ACS, symptomatic worsening mitral regurg, likely multifactorial   Plan: will continue BiPAP, give ASA and SL nitro and then initiate drip, labs, imaging.  Work up reveals HF exacerbation - edema on XR and elevated BNP. Also reveals elevated lactate, which could be from hypoperfusion 2/2 HF, although in the setting of leukocytosis (23) as well will opt to cover with antibiotics given the severity of her initial respiratory distress. On BiPAP after nitro gtt initiated she is visibly more comfortable, speaking in full sentences, leaning back in bed more comfortably. Mildly elevated HS trop, likely demand. Negative COVID. Discussed w/ hospitalist for admission.   Final Clinical Impression(s) / ED Diagnosis  Final diagnoses:   SOB (shortness of breath)       Note:  This document was prepared using Dragon voice recognition software and  may include unintentional dictation errors.   Lilia Pro., MD 08/24/19 2131

## 2019-08-24 NOTE — Progress Notes (Signed)
Received report from [place], [place]

## 2019-08-24 NOTE — ED Triage Notes (Signed)
Pt arrives via EMS with cc SOB. Pt states this has been going on for 4-5 hours. Sitting in tripod positioning and labored breathing at rest.  EMS gave 125 solumedrol and 3 duonebs.  20 G L hand.

## 2019-08-25 ENCOUNTER — Inpatient Hospital Stay: Payer: Medicare Other | Admitting: Internal Medicine

## 2019-08-25 ENCOUNTER — Other Ambulatory Visit: Payer: Self-pay | Admitting: *Deleted

## 2019-08-25 ENCOUNTER — Inpatient Hospital Stay: Payer: Medicare Other

## 2019-08-25 DIAGNOSIS — J9601 Acute respiratory failure with hypoxia: Secondary | ICD-10-CM | POA: Diagnosis not present

## 2019-08-25 DIAGNOSIS — I059 Rheumatic mitral valve disease, unspecified: Secondary | ICD-10-CM | POA: Insufficient documentation

## 2019-08-25 DIAGNOSIS — J441 Chronic obstructive pulmonary disease with (acute) exacerbation: Secondary | ICD-10-CM

## 2019-08-25 LAB — BASIC METABOLIC PANEL
Anion gap: 12 (ref 5–15)
BUN: 32 mg/dL — ABNORMAL HIGH (ref 8–23)
CO2: 23 mmol/L (ref 22–32)
Calcium: 8.6 mg/dL — ABNORMAL LOW (ref 8.9–10.3)
Chloride: 100 mmol/L (ref 98–111)
Creatinine, Ser: 1.34 mg/dL — ABNORMAL HIGH (ref 0.44–1.00)
GFR calc Af Amer: 48 mL/min — ABNORMAL LOW (ref >60)
GFR calc non Af Amer: 41 mL/min — ABNORMAL LOW (ref >60)
Glucose, Bld: 273 mg/dL — ABNORMAL HIGH (ref 70–99)
Potassium: 4.1 mmol/L (ref 3.5–5.1)
Sodium: 135 mmol/L (ref 135–145)

## 2019-08-25 LAB — LACTIC ACID, PLASMA: Lactic Acid, Venous: 3.5 mmol/L (ref 0.5–1.9)

## 2019-08-25 LAB — CBC
HCT: 34.5 % — ABNORMAL LOW (ref 36.0–46.0)
Hemoglobin: 11.5 g/dL — ABNORMAL LOW (ref 12.0–15.0)
MCH: 31.6 pg (ref 26.0–34.0)
MCHC: 33.3 g/dL (ref 30.0–36.0)
MCV: 94.8 fL (ref 80.0–100.0)
Platelets: 257 10*3/uL (ref 150–400)
RBC: 3.64 MIL/uL — ABNORMAL LOW (ref 3.87–5.11)
RDW: 15 % (ref 11.5–15.5)
WBC: 15.4 10*3/uL — ABNORMAL HIGH (ref 4.0–10.5)
nRBC: 0 % (ref 0.0–0.2)

## 2019-08-25 LAB — PROCALCITONIN: Procalcitonin: 0.96 ng/mL

## 2019-08-25 LAB — PHOSPHORUS: Phosphorus: 2.9 mg/dL (ref 2.5–4.6)

## 2019-08-25 LAB — MAGNESIUM: Magnesium: 1.9 mg/dL (ref 1.7–2.4)

## 2019-08-25 MED ORDER — SODIUM CHLORIDE 0.9% FLUSH
3.0000 mL | Freq: Two times a day (BID) | INTRAVENOUS | Status: DC
  Administered 2019-08-25 – 2019-08-27 (×4): 3 mL via INTRAVENOUS

## 2019-08-25 MED ORDER — SENNOSIDES-DOCUSATE SODIUM 8.6-50 MG PO TABS
1.0000 | ORAL_TABLET | Freq: Two times a day (BID) | ORAL | Status: DC
  Administered 2019-08-25 – 2019-08-27 (×4): 1 via ORAL
  Filled 2019-08-25 (×5): qty 1

## 2019-08-25 MED ORDER — HYDROCORTISONE NA SUCCINATE PF 100 MG IJ SOLR
50.0000 mg | Freq: Four times a day (QID) | INTRAMUSCULAR | Status: AC
  Administered 2019-08-25 – 2019-08-26 (×4): 50 mg via INTRAVENOUS
  Filled 2019-08-25 (×2): qty 1
  Filled 2019-08-25: qty 2
  Filled 2019-08-25: qty 1
  Filled 2019-08-25: qty 2

## 2019-08-25 MED ORDER — HYDROCORTISONE NA SUCCINATE PF 100 MG IJ SOLR
100.0000 mg | Freq: Once | INTRAMUSCULAR | Status: AC
  Administered 2019-08-25: 100 mg via INTRAVENOUS
  Filled 2019-08-25: qty 2

## 2019-08-25 MED ORDER — PHENYLEPHRINE HCL-NACL 10-0.9 MG/250ML-% IV SOLN
0.0000 ug/min | INTRAVENOUS | Status: DC
  Administered 2019-08-25: 20 ug/min via INTRAVENOUS
  Filled 2019-08-25 (×2): qty 250

## 2019-08-25 MED ORDER — AZITHROMYCIN 500 MG PO TABS
500.0000 mg | ORAL_TABLET | ORAL | Status: DC
  Administered 2019-08-25: 500 mg via ORAL
  Filled 2019-08-25 (×2): qty 1

## 2019-08-25 NOTE — Consult Note (Signed)
[facility] Cardiology  CARDIOLOGY CONSULT NOTE  Patient ID: Ashley [name] MRN: [id number] DOB/AGE: 06-17-1951 68 y.o.  Admit date: 08/24/2019 Referring Physician [place] Primary Physician [facility] Primary Cardiologist Chitara Clonch Reason for Consultation congestive heart failure  HPI: 68 year old female referred for evaluation of congestive heart failure.  The patient has known history of non-ST elevation myocardial infarction in 2007, status post coronary bypass surgery and mitral valve repair with LIMA to LAD, and saphenous vein bypass graft to OM1 and PDA 11/2005.  Recent 2D echocardiogram 05/27/2019 revealed LV ejection fraction of 40 to 45% with moderate asymmetric left ventricular hypertrophy, RV enlargement, moderate to severe mitral gravitation, and moderate to severe mitral stenosis with mean gradient 15 mmHg.  The patient is also status post BiV ICD 01/2016.  The patient has had multiple hospitalizations to [facility] for respiratory failure, felt to be multifactorial.  Patient recently hospitalized 08/12/2019 with respiratory failure, felt to be secondary to COPD, bronchitis, and acute on chronic systolic congestive heart failure.  The patient again admitted on 08/24/2019 with respiratory failure, chest x-ray revealing pulmonary edema, treated with intravenous Lasix, and nitroglycerin drip.  Patient treated with BiPAP and broad-spectrum antibiotics for presumed community-acquired pneumonia.  Patient clinically improved overnight, currently denies chest pain with residual shortness of breath.  Admission labs notable for high-sensitivity troponin of 21 and 24.  Review of systems complete and found to be negative unless listed above     Past Medical History:  Diagnosis Date  . AICD (automatic cardioverter/defibrillator) present    on right side  . Anemia   . CAD (coronary artery disease)    s/p CABG  . CHF (congestive heart failure) ([place])   . Chronic kidney disease    renal artery stenosis  . Colon  cancer ([place])   . Depression   . GERD (gastroesophageal reflux disease)   . Hx of colonic polyps   . Hyperlipidemia   . Hypertension   . Mitral valve disorder    s/p mitral valve repair wth CABG  . Myocardial infarction ([place])   . Peripheral vascular disease ([place])   . Presence of permanent cardiac pacemaker    Pacemaker/ Defibrillator    Past Surgical History:  Procedure Laterality Date  . arm surgery     fracture, has plates and screws  . CABG with mitral valve repair    . CARDIAC CATHETERIZATION    . COLON SURGERY     colon cancer  . COLONOSCOPY WITH PROPOFOL N/A 10/09/2016   Procedure: COLONOSCOPY WITH PROPOFOL;  Surgeon: Jonathon Bellows, MD;  Location: [facility] ENDOSCOPY;  Service: Endoscopy;  Laterality: N/A;  . COLONOSCOPY WITH PROPOFOL N/A 07/24/2018   Procedure: COLONOSCOPY WITH PROPOFOL;  Surgeon: Virgel Manifold, MD;  Location: [facility] ENDOSCOPY;  Service: Endoscopy;  Laterality: N/A;  . CORONARY ARTERY BYPASS GRAFT    . ENTEROSCOPY N/A 11/21/2018   Procedure: ENTEROSCOPY-BALLOON;  Surgeon: Jonathon Bellows, MD;  Location: [facility] ENDOSCOPY;  Service: Gastroenterology;  Laterality: N/A;  . ESOPHAGOGASTRODUODENOSCOPY (EGD) WITH PROPOFOL N/A 07/24/2018   Procedure: ESOPHAGOGASTRODUODENOSCOPY (EGD) WITH PROPOFOL;  Surgeon: Virgel Manifold, MD;  Location: [facility] ENDOSCOPY;  Service: Endoscopy;  Laterality: N/A;  . GIVENS CAPSULE STUDY N/A 08/16/2018   Procedure: GIVENS CAPSULE STUDY;  Surgeon: Virgel Manifold, MD;  Location: [facility] ENDOSCOPY;  Service: Endoscopy;  Laterality: N/A;  . pace maker defib  2016  . PERIPHERAL VASCULAR CATHETERIZATION N/A 10/23/2016   Procedure: Renal Angiography;  Surgeon: Algernon Huxley, MD;  Location: [facility];  Service:  Cardiovascular;  Laterality: N/A;  . TOTAL ABDOMINAL HYSTERECTOMY  1999   history of abnormal pap    Medications Prior to Admission  Medication Sig Dispense Refill Last Dose  . acetaminophen (TYLENOL) 500 MG tablet Maximum 6  tablets a day. (Patient taking differently: Take 500 mg by mouth every 4 (four) hours as needed for fever. Maximum 6 tablets a day.) 42 tablet 0 prn at prn  . albuterol (VENTOLIN HFA) 108 (90 Base) MCG/ACT inhaler INHALE 2 PUFFS BY MOUTH INTO THE LUNGS EVERY 6 HOURS AS NEEDED FOR WHEEZING (Patient taking differently: Inhale 2 puffs into the lungs every 6 (six) hours as needed for wheezing or shortness of breath. INHALE 2 PUFFS BY MOUTH INTO THE LUNGS EVERY 6 HOURS AS NEEDED FOR WHEEZING) 18 g 2 prn at prn  . ALPRAZolam (XANAX) 0.5 MG tablet Take 0.5-1 tablets (0.25-0.5 mg total) by mouth at bedtime. TAKE ONE TABLET BY MOUTH AT BEDTIME AS NEEDED FOR ANXIETY 30 tablet 3 prn at prn  . aspirin 81 MG tablet Take 81 mg by mouth daily.   08/23/2019 at Unknown time  . atorvastatin (LIPITOR) 40 MG tablet TAKE ONE TABLET BY MOUTH ONCE DAILY (Patient taking differently: Take 40 mg by mouth daily. ) 90 tablet 1 08/23/2019 at Unknown time  . carvedilol (COREG) 3.125 MG tablet Take 1 tablet (3.125 mg total) by mouth 2 (two) times daily with a meal. 60 tablet 0 08/23/2019 at Unknown time  . clopidogrel (PLAVIX) 75 MG tablet Take 1 tablet (75 mg total) by mouth daily. 90 tablet 1 08/23/2019 at Unknown time  . cyanocobalamin (,VITAMIN B-12,) 1000 MCG/ML injection Inject 1 mL weekly into the muscle monthly (Patient taking differently: Inject 1,000 mcg into the muscle every 30 (thirty) days. ) 10 mL 0 Past Month at Unknown time  . diphenhydrAMINE (DIPHENHIST) 25 mg capsule Take 25 mg by mouth at bedtime as needed for allergies or sleep.    prn at prn  . escitalopram (LEXAPRO) 10 MG tablet Take 1 tablet (10 mg total) by mouth daily. 90 tablet 1 08/23/2019 at Unknown time  . ferrous sulfate 325 (65 FE) MG EC tablet Take 325 mg by mouth daily with breakfast.   08/23/2019 at Unknown time  . Fluticasone-Umeclidin-Vilant (TRELEGY ELLIPTA) 100-62.5-25 MCG/INH AEPB Inhale 1 puff into the lungs daily.   08/23/2019 at Unknown time   . furosemide (LASIX) 40 MG tablet One tablet twice a day (Patient taking differently: Take 40 mg by mouth 2 (two) times daily. ) 60 tablet 0 08/23/2019 at Unknown time  . ipratropium-albuterol (DUONEB) 0.5-2.5 (3) MG/3ML SOLN Take 3 mLs by nebulization every 6 (six) hours as needed. (Patient taking differently: Take 3 mLs by nebulization every 6 (six) hours as needed (respiratory symptoms). ) 360 mL 1 prn at prn  . nitroGLYCERIN (NITROSTAT) 0.4 MG SL tablet Place 1 tablet (0.4 mg total) under the tongue every 5 (five) minutes as needed for chest pain. 30 tablet 3 prn at prn  . omeprazole (PRILOSEC) 40 MG capsule Take 1 capsule (40 mg total) by mouth daily. 90 capsule 1 08/23/2019 at Unknown time  . potassium chloride (K-DUR) 10 MEQ tablet Take 1 tablet (10 mEq total) by mouth daily. 90 tablet 1 08/23/2019 at Unknown time  . sacubitril-valsartan (ENTRESTO) 24-26 MG Take 1 tablet by mouth 2 (two) times daily. 60 tablet 0 08/23/2019 at Unknown time  . spironolactone (ALDACTONE) 25 MG tablet Take 0.5 tablets (12.5 mg total) by mouth daily. 45 tablet 1  08/23/2019 at Unknown time  . traZODone (DESYREL) 50 MG tablet Take 0.5-1 tablets (25-50 mg total) by mouth daily. One hour before bedtime 90 tablet 3 08/23/2019 at Unknown time   Social History   Socioeconomic History  . Marital status: Significant Other    Spouse name: Not on file  . Number of children: 0  . Years of education: 68  . Highest education level: 11th grade  Occupational History  . Occupation: disabled  Social Needs  . Financial resource strain: Not hard at all  . Food insecurity    Worry: Never true    Inability: Never true  . Transportation needs    Medical: No    Non-medical: No  Tobacco Use  . Smoking status: Former Smoker    Packs/day: 0.25    Types: Cigarettes    Quit date: 06/19/2019    Years since quitting: 0.1  . Smokeless tobacco: Never Used  . Tobacco comment: reports smoking 2-3 cigarettes/ day  Substance and  Sexual Activity  . Alcohol use: Yes    Alcohol/week: 2.0 standard drinks    Types: 2 Shots of liquor per week    Comment: occasion  . Drug use: No  . Sexual activity: Yes    Comment: 1 partner  Lifestyle  . Physical activity    Days per week: 0 days    Minutes per session: Not on file  . Stress: Only a little  Relationships  . Social Herbalist on phone: Once a week    Gets together: Once a week    Attends religious service: Never    Active member of club or organization: No    Attends meetings of clubs or organizations: Never    Relationship status: Living with partner  . Intimate partner violence    Fear of current or ex partner: No    Emotionally abused: No    Physically abused: No    Forced sexual activity: No  Other Topics Concern  . Not on file  Social History Narrative   07/01/2019: Patient reports she has stopped smoking completely since she was hospitalized on June 19, 2019    Family History  Problem Relation Age of Onset  . Arthritis Mother   . Cancer Mother        uterus cancer  . Hyperlipidemia Mother   . Hypertension Mother   . Heart disease Mother   . Diabetes Mother   . Hyperlipidemia Father   . Hypertension Father   . Heart disease Father   . Diabetes Father   . Cancer Sister        ovary cancer  . Diabetes Maternal Grandmother   . Hypertension Maternal Grandmother   . Arthritis Maternal Grandmother   . Hypertension Maternal Grandfather   . Hypertension Paternal Grandmother   . Hypertension Paternal Grandfather   . Heart disease Paternal Grandfather   . Heart disease Brother   . Kidney disease Brother   . Breast cancer Neg Hx       Review of systems complete and found to be negative unless listed above      PHYSICAL EXAM  General: Well developed, well nourished, in no acute distress HEENT:  Normocephalic and atramatic Neck:  No JVD.  Lungs: Clear bilaterally to auscultation and percussion. Heart: HRRR . Normal S1 and S2  without gallops or murmurs.  Abdomen: Bowel sounds are positive, abdomen soft and non-tender  Msk:  Back normal, normal gait. Normal strength and tone for  age. Extremities: No clubbing, cyanosis or edema.   Neuro: Alert and oriented X 3. Psych:  Good affect, responds appropriately  Labs:   Lab Results  Component Value Date   WBC 15.4 (H) 08/25/2019   HGB 11.5 (L) 08/25/2019   HCT 34.5 (L) 08/25/2019   MCV 94.8 08/25/2019   PLT 257 08/25/2019    Recent Labs  Lab 08/24/19 0906 08/25/19 0050  NA 134* 135  K 5.0 4.1  CL 99 100  CO2 21* 23  BUN 21 32*  CREATININE 1.13* 1.34*  CALCIUM 9.0 8.6*  PROT 7.4  --   BILITOT 0.9  --   ALKPHOS 92  --   ALT 16  --   AST 20  --   GLUCOSE 379* 273*   Lab Results  Component Value Date   CKTOTAL 44 05/30/2013   CKMB 33.2 (H) 08/19/2014   TROPONINI <0.03 03/16/2019    Lab Results  Component Value Date   CHOL 148 11/08/2018   CHOL 222 (H) 05/06/2018   CHOL 229 (H) 06/06/2016   Lab Results  Component Value Date   HDL 39.60 11/08/2018   HDL 41 (L) 05/06/2018   HDL 35.30 (L) 06/06/2016   Lab Results  Component Value Date   LDLCALC 139 (H) 05/06/2018   LDLCALC 90 10/09/2014   LDLCALC 107 (H) 05/30/2013   Lab Results  Component Value Date   TRIG (H) 11/08/2018    550.0 Triglyceride is over 400; calculations on Lipids are invalid.   TRIG 276 (H) 05/06/2018   TRIG (H) 06/06/2016    477.0 Triglyceride is over 400; calculations on Lipids are invalid.   Lab Results  Component Value Date   CHOLHDL 4 11/08/2018   CHOLHDL 5.4 (H) 05/06/2018   CHOLHDL 6 06/06/2016   Lab Results  Component Value Date   LDLDIRECT 62.0 11/08/2018   LDLDIRECT 155.0 06/06/2016   LDLDIRECT 128 (H) 10/09/2014      Radiology: Dg Chest 2 View  Result Date: 08/02/2019 CLINICAL DATA:  Chest pain EXAM: CHEST - 2 VIEW COMPARISON:  07/16/2019 FINDINGS: Surgical plate and fixating screws in the left clavicle. Post sternotomy changes. Right-sided pacing  device with multiple leads. Mild cardiomegaly. Coarse interstitial opacity, likely chronic change. No acute consolidation or pleural effusion. IMPRESSION: No active cardiopulmonary disease.  Mild cardiomegaly. Electronically Signed   By: Donavan Foil M.D.   On: 08/02/2019 23:33   Dg Chest Port 1 View  Result Date: 08/25/2019 CLINICAL DATA:  Acute respiratory failure EXAM: PORTABLE CHEST 1 VIEW COMPARISON:  Yesterday FINDINGS: Cardiomegaly with diffuse interstitial opacity that is mildly improved. CABG and biventricular pacer implant. No air bronchogram, effusion, or pneumothorax. Nodular density at the right base with no correlate on recent chest CT. No effusion or pneumothorax. IMPRESSION: Improved pulmonary edema. Electronically Signed   By: Monte Fantasia M.D.   On: 08/25/2019 04:57   Dg Chest Port 1 View  Result Date: 08/24/2019 CLINICAL DATA:  Shortness of breath EXAM: PORTABLE CHEST 1 VIEW COMPARISON:  08/12/2019 FINDINGS: Cardiomegaly with mild interstitial edema, similar to the prior. No definite pleural effusions. No pneumothorax. Right subclavian ICD.  Postsurgical changes related to prior CABG. Median sternotomy. Status post ORIF of the left clavicle. IMPRESSION: Cardiomegaly with mild interstitial edema. Electronically Signed   By: Julian Hy M.D.   On: 08/24/2019 09:22   Dg Chest Portable 1 View  Result Date: 08/12/2019 CLINICAL DATA:  Shortness of breath and hypoxia. EXAM: PORTABLE CHEST 1 VIEW COMPARISON:  08/02/2019 FINDINGS: Right-sided pacemaker unchanged. Lungs are adequately inflated demonstrate moderate hazy prominence of the central perihilar vasculature compatible mild to moderate interstitial edema. No effusion. Mild stable cardiomegaly. Remainder of the exam is unchanged. IMPRESSION: Mild stable cardiomegaly with mild to moderate interstitial edema. Infection less likely. Electronically Signed   By: Marin Olp M.D.   On: 08/12/2019 08:18    EKG: Sinus  tachycardia, intermittent atrial sensing with ventricular pacing  ASSESSMENT AND PLAN:   1.  Respiratory failure, multifactorial, secondary to COPD and possible community-acquired pneumonia, with acute on chronic systolic congestive heart failure, improved after diuresis, bronchodilator therapy, and broad-spectrum antibiotics 2.  Acute on chronic systolic congestive heart failure 3.  Coronary artery disease, status post CABG, currently without chest pain, with borderline elevated high-sensitivity troponin, more consistent with demand supply ischemia 4.  Ischemic cardiomyopathy, status post BiV ICD 5.  Moderate to severe mitral regurgitation and mitral stenosis  Recommendations  1.  Agree with overall current therapy 2.  Continue diuresis 3.  Carefully monitor renal status 4.  Consider right and left heart cardiac catheterization on 08/25/2019 to further evaluate underlying coronary artery disease, ischemic cardiomyopathy, mitral regurgitation/stenosis 5.  Review 2D echocardiogram  Signed: Isaias Cowman MD,PhD, [facility] 08/25/2019, 1:43 PM

## 2019-08-25 NOTE — Patient Outreach (Signed)
[facility]) Care Management  08/25/2019  Ashley [name] [birth year] [id number]  Covering for assigned care coordinator.   Care Coordination    Patient has experienced hospital readmission on 10/18 for Shortness of breath,  Acute respiratory failure , admitted to [facility] ICU . This is a less than 30 day readmission .   Patient with recent hospital admission 10/6-10/9 Dx: Acute  Heart failure.    Plan Will send in basket message to [facility] liaison and  team,  notifying of patient readmission and will follow for disposition/discharge planning .    Joylene Draft, RN, [place] Management Coordinator  [phone]- Mobile [phone]- Toll Free Main Office

## 2019-08-25 NOTE — Plan of Care (Signed)
Patient progressing, off Neo drip since 0600- Blood pressure stable- no BP medications given this shift per MD. Patient started on stool softener to help with BMs. Attempting to wean patient off O2. Patient denies any pain this shift.

## 2019-08-25 NOTE — Progress Notes (Signed)
Received report from [place] in ICU. Patients transferred to room 112.

## 2019-08-25 NOTE — Progress Notes (Addendum)
CRITICAL CARE NOTE  CC  Follow up for respiratory failure and CHF exacerbation   SUBJECTIVE Patient condition has improved since admission to ICU SOB improving, alert and awake Patient came to the hospital for SOB after attempting to take nitroglycerin and an inhaler at home. She was admitted and put on BIPAP, which she reports helped her SOB. Patient was give IV Vancomycin, which immediately caused her to become nauseous and shake. She was switched to Rocephin and Zithromax, which she has been tolerating well.       BP 101/63 (BP Location: Right Arm)   Pulse 76   Temp 97.7 F (36.5 C) (Oral)   Resp 20   Ht 5\' 4"  (1.626 m)   Wt 73.7 kg   SpO2 98%   BMI 27.89 kg/m    I/O last 3 completed shifts: In: 16.1 [I.V.:16.1] Out: 1450 [Urine:1450] Total I/O In: 480 [P.O.:480] Out: 200 [Urine:200]  SpO2: 98 % O2 Flow Rate (L/min): 3 L/min FiO2 (%): 28 %    CBC    Component Value Date/Time   WBC 15.4 (H) 08/25/2019 0050   RBC 3.64 (L) 08/25/2019 0050   HGB 11.5 (L) 08/25/2019 0050   HGB 12.8 08/20/2014 0449   HCT 34.5 (L) 08/25/2019 0050   HCT 37.3 08/20/2014 0449   PLT 257 08/25/2019 0050   PLT 278 08/20/2014 0449   MCV 94.8 08/25/2019 0050   MCV 93 08/20/2014 0449   MCH 31.6 08/25/2019 0050   MCHC 33.3 08/25/2019 0050   RDW 15.0 08/25/2019 0050   RDW 13.5 08/20/2014 0449   LYMPHSABS 2.4 08/24/2019 0906   LYMPHSABS 2.1 08/20/2014 0449   MONOABS 1.3 (H) 08/24/2019 0906   MONOABS 1.1 (H) 08/20/2014 0449   EOSABS 0.3 08/24/2019 0906   EOSABS 0.2 08/20/2014 0449   BASOSABS 0.2 (H) 08/24/2019 0906   BASOSABS 0.1 08/20/2014 0449    BMP Latest Ref Rng & Units 08/25/2019 08/24/2019 08/15/2019  Glucose 70 - 99 mg/dL 273(H) 379(H) 129(H)  BUN 8 - 23 mg/dL 32(H) 21 40(H)  Creatinine 0.44 - 1.00 mg/dL 1.34(H) 1.13(H) 1.08(H)  BUN/Creat Ratio 6 - 22 (calc) - - -  Sodium 135 - 145 mmol/L 135 134(L) 139  Potassium 3.5 - 5.1 mmol/L 4.1 5.0 3.5  Chloride 98 - 111 mmol/L 100  99 103  CO2 22 - 32 mmol/L 23 21(L) 26  Calcium 8.9 - 10.3 mg/dL 8.6(L) 9.0 8.9     SIGNIFICANT EVENTS 10/18 admitted for acute resp failure 10/19 weaned off of biPAP   REVIEW OF SYSTEMS Review of Systems  Constitutional: Negative for chills, diaphoresis and fever.  Eyes: Negative for blurred vision, double vision and pain.  Respiratory: Positive for cough ("Normal" cough she usually has). Negative for shortness of breath and wheezing.   Cardiovascular: Negative for chest pain, palpitations and leg swelling.  Gastrointestinal: Negative for abdominal pain, nausea and vomiting.  Genitourinary: Negative for dysuria.  Neurological: Negative for headaches.       PHYSICAL EXAMINATION:  GENERAL: Appears her age and is in no acute distress  HEAD: Normocephalic, atraumatic.  EYES: Pupils equal, round, reactive to light.  No scleral icterus.  MOUTH: Moist mucosal membrane. NECK: Supple.  PULMONARY: +rhonchi bilaterally  CARDIOVASCULAR: RRR, no murmurs, gallops or rubs  GASTROINTESTINAL:Soft, nontender, -distended. No masses. Positive bowel sounds. No hepatosplenomegaly.  MUSCULOSKELETAL: No swelling, clubbing, or edema.  NEUROLOGIC: A+Ox4 SKIN:intact,warm,dry  MEDICATIONS: I have reviewed all medications and confirmed regimen as documented   CULTURE RESULTS  Recent Results (from the past 240 hour(s))  SARS Coronavirus 2 by RT PCR (hospital order, performed in [facility] hospital lab) Nasopharyngeal Nasopharyngeal Swab     Status: None   Collection Time: 08/24/19  9:06 AM   Specimen: Nasopharyngeal Swab  Result Value Ref Range Status   SARS Coronavirus 2 NEGATIVE NEGATIVE Final    Comment: (NOTE) If result is NEGATIVE SARS-CoV-2 target nucleic acids are NOT DETECTED. The SARS-CoV-2 RNA is generally detectable in upper and lower  respiratory specimens during the acute phase of infection. The lowest  concentration of SARS-CoV-2 viral copies this assay can detect is 250   copies / mL. A negative result does not preclude SARS-CoV-2 infection  and should not be used as the sole basis for treatment or other  patient management decisions.  A negative result may occur with  improper specimen collection / handling, submission of specimen other  than nasopharyngeal swab, presence of viral mutation(s) within the  areas targeted by this assay, and inadequate number of viral copies  (<250 copies / mL). A negative result must be combined with clinical  observations, patient history, and epidemiological information. If result is POSITIVE SARS-CoV-2 target nucleic acids are DETECTED. The SARS-CoV-2 RNA is generally detectable in upper and lower  respiratory specimens dur ing the acute phase of infection.  Positive  results are indicative of active infection with SARS-CoV-2.  Clinical  correlation with patient history and other diagnostic information is  necessary to determine patient infection status.  Positive results do  not rule out bacterial infection or co-infection with other viruses. If result is PRESUMPTIVE POSTIVE SARS-CoV-2 nucleic acids MAY BE PRESENT.   A presumptive positive result was obtained on the submitted specimen  and confirmed on repeat testing.  While 2019 novel coronavirus  (SARS-CoV-2) nucleic acids may be present in the submitted sample  additional confirmatory testing may be necessary for epidemiological  and / or clinical management purposes  to differentiate between  SARS-CoV-2 and other Sarbecovirus currently known to infect humans.  If clinically indicated additional testing with an alternate test  methodology [phone]) is advised. The SARS-CoV-2 RNA is generally  detectable in upper and lower respiratory sp ecimens during the acute  phase of infection. The expected result is Negative. Fact Sheet for Patients:  StrictlyIdeas.no Fact Sheet for Healthcare  Providers: BankingDealers.co.za This test is not yet approved or cleared by the [place] FDA and has been authorized for detection and/or diagnosis of SARS-CoV-2 by FDA under an Emergency Use Authorization (EUA).  This EUA will remain in effect (meaning this test can be used) for the duration of the COVID-19 declaration under Section 564(b)(1) of the Act, 21 U.S.C. section 360bbb-3(b)(1), unless the authorization is terminated or revoked sooner. Performed at [facility], [address] [place], [place] [address]   MRSA PCR Screening     Status: None   Collection Time: 08/24/19  1:30 PM   Specimen: Nasal Mucosa; Nasopharyngeal  Result Value Ref Range Status   MRSA by PCR NEGATIVE NEGATIVE Final    Comment:        The GeneXpert MRSA Assay (FDA approved for NASAL specimens only), is one component of a comprehensive MRSA colonization surveillance program. It is not intended to diagnose MRSA infection nor to guide or monitor treatment for MRSA infections. Performed at [facility], [address], [place], [place] [address]   Respiratory Panel by PCR     Status: None   Collection Time: 08/24/19  2:31  PM   Specimen: Nasopharyngeal Swab; Respiratory  Result Value Ref Range Status   Adenovirus NOT DETECTED NOT DETECTED Final   Coronavirus 229E NOT DETECTED NOT DETECTED Final    Comment: (NOTE) The Coronavirus on the Respiratory Panel, DOES NOT test for the novel  Coronavirus (2019 nCoV)    Coronavirus HKU1 NOT DETECTED NOT DETECTED Final   Coronavirus NL63 NOT DETECTED NOT DETECTED Final   Coronavirus OC43 NOT DETECTED NOT DETECTED Final   Metapneumovirus NOT DETECTED NOT DETECTED Final   Rhinovirus / Enterovirus NOT DETECTED NOT DETECTED Final   Influenza A NOT DETECTED NOT DETECTED Final   Influenza B NOT DETECTED NOT DETECTED Final   Parainfluenza Virus 1 NOT DETECTED NOT DETECTED Final   Parainfluenza Virus 2 NOT DETECTED NOT  DETECTED Final   Parainfluenza Virus 3 NOT DETECTED NOT DETECTED Final   Parainfluenza Virus 4 NOT DETECTED NOT DETECTED Final   Respiratory Syncytial Virus NOT DETECTED NOT DETECTED Final   Bordetella pertussis NOT DETECTED NOT DETECTED Final   Chlamydophila pneumoniae NOT DETECTED NOT DETECTED Final   Mycoplasma pneumoniae NOT DETECTED NOT DETECTED Final    Comment: Performed at [facility], [place] [address] [place], [place] [address]          IMAGING    Dg Chest Port 1 View  Result Date: 08/25/2019 CLINICAL DATA:  Acute respiratory failure EXAM: PORTABLE CHEST 1 VIEW COMPARISON:  Yesterday FINDINGS: Cardiomegaly with diffuse interstitial opacity that is mildly improved. CABG and biventricular pacer implant. No air bronchogram, effusion, or pneumothorax. Nodular density at the right base with no correlate on recent chest CT. No effusion or pneumothorax. IMPRESSION: Improved pulmonary edema. Electronically Signed   By: Monte Fantasia M.D.   On: 08/25/2019 04:57       Indwelling Urinary Catheter continued, requirement due to   Reason to continue Indwelling Urinary Catheter strict Intake/Output monitoring for hemodynamic instability      ASSESSMENT AND PLAN SYNOPSIS   Acute Hypoxic Respiratory Failure from CHF/COPD exacerbation- slowly resolving -continue [place] oxygen as needed  -continue patient on rocephin and Zithromax for CAP  -continue ipratropium-albuterol nebulizing treatments as needed    SEVERE COPD EXACERBATION -continue IV steroids as prescribed -continue NEB THERAPY as prescribed -wean fio2 as needed and tolerated   CHRONIC DIASTOLIC HEART FAILURE  -oxygen as needed -Lasix as tolerated for diuresis  -continue control of HTN with carvedilol and Entresto  -follow up cardiac enzymes as indicated   ACUTE KIDNEY INJURY/Renal Failure -follow chem 7 -follow UO -Avoid nephrotoxic agents -Recheck creatinine    ID -continue IV abx as prescribed for  CAP  -follow up cultures  GI GI PROPHYLAXIS as indicated  NUTRITIONAL STATUS DIET-->diets as tolerated Constipation protocol as indicated  ENDO - will use ICU hypoglycemic\Hyperglycemia protocol if indicated   ELECTROLYTES -follow labs as needed -replace as needed -pharmacy consultation and following  DVT Prophylaxis -Continue SCDs bilaterally    OK to transfer to gen med floor  Dallis Czaja Patricia Pesa, M.D.  [facility]  Medical Director [place] Director [place] Department

## 2019-08-25 NOTE — Progress Notes (Signed)
PHARMACIST - PHYSICIAN COMMUNICATION   CONCERNING: Antibiotic IV to Oral Route Change Policy  RECOMMENDATION: This patient is receiving azithromycin by the intravenous route.  Based on criteria approved by the Pharmacy and Therapeutics Committee, the antibiotic(s) is/are being converted to the equivalent oral dose form(s).   DESCRIPTION: These criteria include:  Patient being treated for a respiratory tract infection, urinary tract infection, cellulitis or clostridium difficile associated diarrhea if on metronidazole  The patient is not neutropenic and does not exhibit a GI malabsorption state  The patient is eating (either orally or via tube) and/or has been taking other orally administered medications for a least 24 hours  The patient is improving clinically and has a Tmax < 100.5  If you have questions about this conversion, please contact the Pharmacy Department    Abby Ellington, PharmD    

## 2019-08-25 NOTE — Progress Notes (Signed)
[place] [facility] [place] NAME: Ashley [name]    MR#:  [id number]  DATE OF BIRTH:  Oct 09, 1951  SUBJECTIVE:    REVIEW OF SYSTEMS:   ROS Tolerating Diet: Tolerating PT:   DRUG ALLERGIES:   Allergies  Allergen Reactions  . Vancomycin Nausea And Vomiting and Palpitations  . Hydrocodone-Acetaminophen Nausea Only    VITALS:  Blood pressure 104/63, pulse 85, temperature 97.7 F (36.5 C), temperature source Oral, resp. rate 19, height 5\' 4"  (1.626 m), weight 73.7 kg, SpO2 96 %.  PHYSICAL EXAMINATION:   Physical Exam  GENERAL:  68 y.o.-year-old patient lying in the bed with no acute distress.  EYES: Pupils equal, round, reactive to light and accommodation. No scleral icterus. Extraocular muscles intact.  HEENT: Head atraumatic, normocephalic. Oropharynx and nasopharynx clear.  NECK:  Supple, no jugular venous distention. No thyroid enlargement, no tenderness.  LUNGS: Normal breath sounds bilaterally, no wheezing, rales, rhonchi. No use of accessory muscles of respiration.  CARDIOVASCULAR: S1, S2 normal. No murmurs, rubs, or gallops.  ABDOMEN: Soft, nontender, nondistended. Bowel sounds present. No organomegaly or mass.  EXTREMITIES: No cyanosis, clubbing or edema b/l.    NEUROLOGIC: Cranial nerves II through XII are intact. No focal Motor or sensory deficits b/l.   PSYCHIATRIC:  patient is alert and oriented x 3.  SKIN: No obvious rash, lesion, or ulcer.   LABORATORY PANEL:  CBC Recent Labs  Lab 08/25/19 0050  WBC 15.4*  HGB 11.5*  HCT 34.5*  PLT 257    Chemistries  Recent Labs  Lab 08/24/19 0906 08/25/19 0050  NA 134* 135  K 5.0 4.1  CL 99 100  CO2 21* 23  GLUCOSE 379* 273*  BUN 21 32*  CREATININE 1.13* 1.34*  CALCIUM 9.0 8.6*  MG  --  1.9  AST 20  --   ALT 16  --   ALKPHOS 92  --   BILITOT 0.9  --    Cardiac Enzymes No results for input(s): TROPONINI in the last 168 hours. RADIOLOGY:  Dg Chest Port 1  View  Result Date: 08/25/2019 CLINICAL DATA:  Acute respiratory failure EXAM: PORTABLE CHEST 1 VIEW COMPARISON:  Yesterday FINDINGS: Cardiomegaly with diffuse interstitial opacity that is mildly improved. CABG and biventricular pacer implant. No air bronchogram, effusion, or pneumothorax. Nodular density at the right base with no correlate on recent chest CT. No effusion or pneumothorax. IMPRESSION: Improved pulmonary edema. Electronically Signed   By: Monte Fantasia M.D.   On: 08/25/2019 04:57   Dg Chest Port 1 View  Result Date: 08/24/2019 CLINICAL DATA:  Shortness of breath EXAM: PORTABLE CHEST 1 VIEW COMPARISON:  08/12/2019 FINDINGS: Cardiomegaly with mild interstitial edema, similar to the prior. No definite pleural effusions. No pneumothorax. Right subclavian ICD.  Postsurgical changes related to prior CABG. Median sternotomy. Status post ORIF of the left clavicle. IMPRESSION: Cardiomegaly with mild interstitial edema. Electronically Signed   By: Julian Hy M.D.   On: 08/24/2019 09:22   ASSESSMENT AND PLAN:  68 year old female with past medical history of coronary artery disease, CKD stage III, previous history of AICD, hypertension, hyperlipidemia, history of colon cancer who presented to the hospital due to shortness of breath.  1. Acute respiratory failure with hypoxia-secondary to CHF acute on chronic systolic in the setting of severe mitral valve regurgitation six with history of mitral valve stenosis -Dr. Saralyn Pilar to see patient--? Valve repair/replacement -Patient was placed on BiPAP. She feels a lot better. Continue  IV Lasix. -Per patient this is her fifth admission since August with flash pulmonary edema  2. CHF-acute on chronic systolic dysfunction. Patient's previous echocardiogram showed EF of 45%. -Patient was diuresed with IV Lasix and responded well to it.  -Patient will continue her oral Lasix, Entresto, carvedilol and Aldactone   3. COPD stable -continue  PRN nebs and inhalers  4. Hyperlipidemia - cont. Atorvastatin.   5. Depression - cont. Lexapro.   6. Anxiety - cont. PRN Xanax.   7. GERD - Pt. Will cont. Omeprazole  Case discussed with Care Management/Social Worker. Management plans discussed with the patient and they are in agreement.  CODE STATUS: full  DVT Prophylaxis: lovenox  TOTAL TIME TAKING CARE OF THIS PATIENT: *30* minutes.  >50% time spent on counselling and coordination of care  POSSIBLE D/C IN *2-3* DAYS, DEPENDING ON CLINICAL CONDITION.  Note: This dictation was prepared with Dragon dictation along with smaller phrase technology. Any transcriptional errors that result from this process are unintentional.  Fritzi Mandes M.D on 08/25/2019 at 1:49 PM  Between 7am to 6pm - Pager - [phone]  After 6pm go to [email or web address] - password [email or web address] [facility]  Office  [phone]  CC: Primary care physician; [name], MDPatient ID: Ashley [name], female   DOB: 22-Apr-1951, 68 y.o.   MRN: [id number]

## 2019-08-26 ENCOUNTER — Encounter
Admission: EM | Disposition: A | Discharge status: Home / Self Care | Payer: Self-pay | Source: Home / Self Care | Attending: Internal Medicine

## 2019-08-26 HISTORY — PX: RIGHT/LEFT HEART CATH AND CORONARY ANGIOGRAPHY: CATH118266

## 2019-08-26 LAB — COMPREHENSIVE METABOLIC PANEL
ALT: 16 U/L (ref 0–44)
AST: 18 U/L (ref 15–41)
Albumin: 3.7 g/dL (ref 3.5–5.0)
Alkaline Phosphatase: 61 U/L (ref 38–126)
Anion gap: 11 (ref 5–15)
BUN: 40 mg/dL — ABNORMAL HIGH (ref 8–23)
CO2: 23 mmol/L (ref 22–32)
Calcium: 8.9 mg/dL (ref 8.9–10.3)
Chloride: 102 mmol/L (ref 98–111)
Creatinine, Ser: 1.1 mg/dL — ABNORMAL HIGH (ref 0.44–1.00)
GFR calc Af Amer: >60 mL/min (ref >60)
GFR calc non Af Amer: 52 mL/min — ABNORMAL LOW (ref >60)
Glucose, Bld: 206 mg/dL — ABNORMAL HIGH (ref 70–99)
Potassium: 4.5 mmol/L (ref 3.5–5.1)
Sodium: 136 mmol/L (ref 135–145)
Total Bilirubin: 0.6 mg/dL (ref 0.3–1.2)
Total Protein: 6.6 g/dL (ref 6.5–8.1)

## 2019-08-26 LAB — TROPONIN I (HIGH SENSITIVITY): Troponin I (High Sensitivity): 14 ng/L (ref <18)

## 2019-08-26 SURGERY — RIGHT/LEFT HEART CATH AND CORONARY ANGIOGRAPHY
Anesthesia: Moderate Sedation

## 2019-08-26 MED ORDER — SODIUM CHLORIDE 0.9% FLUSH: 3.0000 mL | INTRAVENOUS | Status: DC | PRN

## 2019-08-26 MED ORDER — SODIUM CHLORIDE 0.9 % IV SOLN: 250.0000 mL | INTRAVENOUS | Status: DC | PRN

## 2019-08-26 MED ORDER — MIDAZOLAM HCL 2 MG/2ML IJ SOLN
INTRAMUSCULAR | Status: AC
  Filled 2019-08-26: qty 2

## 2019-08-26 MED ORDER — HYDRALAZINE HCL 20 MG/ML IJ SOLN: 10.0000 mg | INTRAMUSCULAR | Status: AC | PRN

## 2019-08-26 MED ORDER — HEPARIN (PORCINE) IN NACL 2000-0.9 UNIT/L-% IV SOLN
INTRAVENOUS | Status: DC | PRN
  Administered 2019-08-26: 500 mL

## 2019-08-26 MED ORDER — ENOXAPARIN SODIUM 40 MG/0.4ML ~~LOC~~ SOLN
40.0000 mg | SUBCUTANEOUS | Status: DC
  Administered 2019-08-26 – 2019-08-27 (×2): 40 mg via SUBCUTANEOUS
  Filled 2019-08-26 (×2): qty 0.4

## 2019-08-26 MED ORDER — HEPARIN (PORCINE) IN NACL 2000-0.9 UNIT/L-% IV SOLN: INTRAVENOUS | Status: DC | PRN

## 2019-08-26 MED ORDER — IOHEXOL 300 MG/ML  SOLN
INTRAMUSCULAR | Status: DC | PRN
  Administered 2019-08-26: 160 mL

## 2019-08-26 MED ORDER — ASPIRIN 81 MG PO CHEW: 81.0000 mg | CHEWABLE_TABLET | ORAL | Status: DC

## 2019-08-26 MED ORDER — HEPARIN (PORCINE) IN NACL 1000-0.9 UT/500ML-% IV SOLN
INTRAVENOUS | Status: AC
  Filled 2019-08-26: qty 1000

## 2019-08-26 MED ORDER — SODIUM CHLORIDE 0.9 % WEIGHT BASED INFUSION: 1.0000 mL/kg/h | INTRAVENOUS | Status: DC

## 2019-08-26 MED ORDER — IPRATROPIUM-ALBUTEROL 0.5-2.5 (3) MG/3ML IN SOLN: 3.0000 mL | RESPIRATORY_TRACT | Status: DC | PRN

## 2019-08-26 MED ORDER — FUROSEMIDE 10 MG/ML IJ SOLN
INTRAMUSCULAR | Status: AC
  Filled 2019-08-26: qty 4

## 2019-08-26 MED ORDER — FENTANYL CITRATE (PF) 100 MCG/2ML IJ SOLN
INTRAMUSCULAR | Status: AC
  Filled 2019-08-26: qty 2

## 2019-08-26 MED ORDER — MIDAZOLAM HCL 2 MG/2ML IJ SOLN
INTRAMUSCULAR | Status: DC | PRN
  Administered 2019-08-26: 1 mg via INTRAVENOUS

## 2019-08-26 MED ORDER — SODIUM CHLORIDE 0.9 % WEIGHT BASED INFUSION
3.0000 mL/kg/h | INTRAVENOUS | Status: AC
  Administered 2019-08-26: 3 mL/kg/h via INTRAVENOUS

## 2019-08-26 MED ORDER — ONDANSETRON HCL 4 MG/2ML IJ SOLN: 4.0000 mg | Freq: Four times a day (QID) | INTRAMUSCULAR | Status: DC | PRN

## 2019-08-26 MED ORDER — FUROSEMIDE 40 MG PO TABS
40.0000 mg | ORAL_TABLET | Freq: Two times a day (BID) | ORAL | Status: DC
  Administered 2019-08-26: 17:00:00 40 mg via ORAL

## 2019-08-26 MED ORDER — LABETALOL HCL 5 MG/ML IV SOLN: 10.0000 mg | INTRAVENOUS | Status: AC | PRN

## 2019-08-26 MED ORDER — SODIUM CHLORIDE 0.9 % WEIGHT BASED INFUSION
1.0000 mL/kg/h | INTRAVENOUS | Status: DC
  Administered 2019-08-26: 1 mL/kg/h via INTRAVENOUS

## 2019-08-26 MED ORDER — FENTANYL CITRATE (PF) 100 MCG/2ML IJ SOLN
INTRAMUSCULAR | Status: DC | PRN
  Administered 2019-08-26: 25 ug via INTRAVENOUS

## 2019-08-26 MED ORDER — SODIUM CHLORIDE 0.9% FLUSH
3.0000 mL | Freq: Two times a day (BID) | INTRAVENOUS | Status: DC
  Administered 2019-08-26 – 2019-08-27 (×2): 3 mL via INTRAVENOUS

## 2019-08-26 MED ORDER — FUROSEMIDE 10 MG/ML IJ SOLN
INTRAMUSCULAR | Status: DC | PRN
  Administered 2019-08-26: 40 mg via INTRAVENOUS

## 2019-08-26 SURGICAL SUPPLY — 16 items
CATH INFINITI 5 FR IM (CATHETERS) ×2 IMPLANT
CATH INFINITI 5FR ANG PIGTAIL (CATHETERS) ×2 IMPLANT
CATH INFINITI 5FR JL4 (CATHETERS) ×2 IMPLANT
CATH INFINITI JR4 5F (CATHETERS) ×2 IMPLANT
CATH SWANZ 7F THERMO (CATHETERS) ×2 IMPLANT
DEVICE CLOSURE MYNXGRIP 5F (Vascular Products) ×2 IMPLANT
GUIDEWIRE EMER 3M J .025X150CM (WIRE) ×2 IMPLANT
KIT MANI 3VAL PERCEP (MISCELLANEOUS) ×3 IMPLANT
KIT RIGHT HEART (MISCELLANEOUS) ×2 IMPLANT
NDL PERC 18GX7CM (NEEDLE) IMPLANT
NEEDLE PERC 18GX7CM (NEEDLE) ×3 IMPLANT
PACK CARDIAC CATH (CUSTOM PROCEDURE TRAY) ×3 IMPLANT
SHEATH AVANTI 5FR X 11CM (SHEATH) ×2 IMPLANT
SHEATH AVANTI 7FRX11 (SHEATH) ×2 IMPLANT
WIRE EMERALD 3MM-J .035X260CM (WIRE) ×2 IMPLANT
WIRE GUIDERIGHT .035X150 (WIRE) ×2 IMPLANT

## 2019-08-26 NOTE — Progress Notes (Signed)
Dr. Posey Pronto told me to hold all medications this a.m. before procedure. Specials called to get report and told me to go ahead and give morning aspirin before she comes to have procedure.

## 2019-08-26 NOTE — Progress Notes (Signed)
Ashley [name] at [place] NAME: Ashley [name]    MR#:  [id number]  DATE OF BIRTH:  1951-08-07  SUBJECTIVE:  patient transferred out of ICU yesterday. Feels at baseline. On room air.  REVIEW OF SYSTEMS:   Review of Systems  Constitutional: Negative for chills, fever and weight loss.  HENT: Negative for ear discharge, ear pain and nosebleeds.   Eyes: Negative for blurred vision, pain and discharge.  Respiratory: Negative for sputum production, shortness of breath, wheezing and stridor.   Cardiovascular: Negative for chest pain, palpitations, orthopnea and PND.  Gastrointestinal: Negative for abdominal pain, diarrhea, nausea and vomiting.  Genitourinary: Negative for frequency and urgency.  Musculoskeletal: Negative for back pain and joint pain.  Neurological: Negative for sensory change, speech change, focal weakness and weakness.  Psychiatric/Behavioral: Negative for depression and hallucinations. The patient is not nervous/anxious.    Tolerating Diet:yes Tolerating PT: not needed  DRUG ALLERGIES:   Allergies  Allergen Reactions  . Vancomycin Nausea And Vomiting and Palpitations  . Hydrocodone-Acetaminophen Nausea Only    VITALS:  Blood pressure (!) 145/96, pulse 85, temperature 97.8 F (36.6 C), temperature source Oral, resp. rate (!) 25, height 5\' 4"  (1.626 m), weight 73.5 kg, SpO2 91 %.  PHYSICAL EXAMINATION:   Physical Exam  GENERAL:  68 y.o.-year-old patient lying in the bed with no acute distress.  EYES: Pupils equal, round, reactive to light and accommodation. No scleral icterus. Extraocular muscles intact.  HEENT: Head atraumatic, normocephalic. Oropharynx and nasopharynx clear.  NECK:  Supple, no jugular venous distention. No thyroid enlargement, no tenderness.  LUNGS: Normal breath sounds bilaterally, no wheezing, rales, rhonchi. No use of accessory muscles of respiration.  CARDIOVASCULAR: S1, S2 normal. No  murmurs, rubs, or gallops.  ABDOMEN: Soft, nontender, nondistended. Bowel sounds present. No organomegaly or mass.  EXTREMITIES: No cyanosis, clubbing or edema b/l.    NEUROLOGIC: Cranial nerves II through XII are intact. No focal Motor or sensory deficits b/l.   PSYCHIATRIC:  patient is alert and oriented x 3.  SKIN: No obvious rash, lesion, or ulcer.   LABORATORY PANEL:  CBC Recent Labs  Lab 08/25/19 0050  WBC 15.4*  HGB 11.5*  HCT 34.5*  PLT 257    Chemistries  Recent Labs  Lab 08/25/19 0050 08/26/19 0449  NA 135 136  K 4.1 4.5  CL 100 102  CO2 23 23  GLUCOSE 273* 206*  BUN 32* 40*  CREATININE 1.34* 1.10*  CALCIUM 8.6* 8.9  MG 1.9  --   AST  --  18  ALT  --  16  ALKPHOS  --  61  BILITOT  --  0.6   Cardiac Enzymes No results for input(s): TROPONINI in the last 168 hours. RADIOLOGY:  Dg Chest Port 1 View  Result Date: 08/25/2019 CLINICAL DATA:  Acute respiratory failure EXAM: PORTABLE CHEST 1 VIEW COMPARISON:  Yesterday FINDINGS: Cardiomegaly with diffuse interstitial opacity that is mildly improved. CABG and biventricular pacer implant. No air bronchogram, effusion, or pneumothorax. Nodular density at the right base with no correlate on recent chest CT. No effusion or pneumothorax. IMPRESSION: Improved pulmonary edema. Electronically Signed   By: Monte Fantasia M.D.   On: 08/25/2019 04:57   ASSESSMENT AND PLAN:  68 year old female with past medical history of coronary artery disease, CKD stage III, previous history of AICD, hypertension, hyperlipidemia, history of colon cancer who presented to the hospital due to shortness of breath.  1. Acute respiratory failure  with hypoxia-secondary to CHF acute on chronic systolic in the setting of severe mitral valve regurgitation with history of mitral valve stenosis and CAD -Dr. Saralyn Pilar to see patient- for cath today -Patient was placed on BiPAP. She feels a lot better. Continue po Lasix. -Per patient this is her  fourth admission since July with flash pulmonary edema  2. CHF-acute on chronic systolic dysfunction. Patient's previous echocardiogram showed EF of 45%. -Patient was diuresed with IV Lasix and responded well to it.  -Patient will continue her oral Lasix, Entresto, carvedilol and Aldactone  -await further recs from Dr Josefa Half  3. COPD stable -continue PRN nebs and inhalers  4. Hyperlipidemia - cont. Atorvastatin.   5. Depression - cont. Lexapro.   6. Anxiety - cont. PRN Xanax.   7. GERD - Pt. Will cont. Omeprazole  Case discussed with Care Management/Social Worker. Management plans discussed with the patient and they are in agreement.  CODE STATUS: full  DVT Prophylaxis: lovenox  TOTAL TIME TAKING CARE OF THIS PATIENT: *30* minutes.  >50% time spent on counselling and coordination of care  POSSIBLE D/C IN *1-2* DAYS, DEPENDING ON CLINICAL CONDITION.  Note: This dictation was prepared with Dragon dictation along with smaller phrase technology. Any transcriptional errors that result from this process are unintentional.  Ashley [name] M.D on 08/26/2019 at 2:17 PM  Between 7am to 6pm - Pager - [phone]  After 6pm go to [email or web address] - password [email or web address] [facility]  Office  [phone]  CC: Primary care physician; [name], MDPatient ID: Ashley [name], female   DOB: [birth year], 68 y.o.   MRN: [id number]

## 2019-08-26 NOTE — TOC Initial Note (Signed)
Transition of Care [facility]) - Initial/Assessment Note    Patient Details  Name: Ashley [name] MRN: [id number] Date of Birth: 1951/05/28  [facility] CM/SW Contact:    Shelbie Hutching, RN Phone Number: 08/26/2019, 3:07 PM  Clinical Narrative:                 Patient admitted with acute respiratory failure, patient has a history of CHF and COPD, patient does have a AICD. Patient is from home, she is independent.  Patient current with PCP Dr. Derrel Nip and [facility].  Patient has no issues obtaining medications from [facility].  Patient has a functioning scale and weighs daily.  Patient declines home health services.  [facility] referral placed at last admission. No further needs identified at this time by transitions of care team.    Expected Discharge Plan: Home/Self Care Barriers to Discharge: Continued Medical Work up   Patient Goals and CMS Choice Patient states their goals for this hospitalization and ongoing recovery are:: to go home      Expected Discharge Plan and Services Expected Discharge Plan: Home/Self Care   Discharge Planning Services: CM Consult, HF Clinic   Living arrangements for the past 2 months: Single Family Home                                      Prior Living Arrangements/Services Living arrangements for the past 2 months: Single Family Home Lives with:: Significant Other Patient language and need for interpreter reviewed:: No Do you feel safe going back to the place where you live?: Yes      Need for Family Participation in Patient Care: Yes (Comment)(CHF) Care giver support system in place?: Yes (comment)(significant other)   Criminal Activity/Legal Involvement Pertinent to Current Situation/Hospitalization: No - Comment as needed  Activities of Daily Living      Permission Sought/Granted Permission sought to share information with : Case Manager Permission granted to share information with : Yes, Verbal Permission  Granted              Emotional Assessment Appearance:: Appears stated age Attitude/Demeanor/Rapport: Engaged Affect (typically observed): Accepting Orientation: : Oriented to Self, Oriented to Place, Oriented to  Time, Oriented to Situation Alcohol / Substance Use: Not Applicable Psych Involvement: No (comment)  Admission diagnosis:  SOB (shortness of breath) [R06.02] Patient Active Problem List   Diagnosis Date Noted  . Mitral valve disorder 08/25/2019  . Acute respiratory failure ([place]) 08/24/2019  . Acute CHF ([place]) 08/12/2019  . CHF (congestive heart failure) ([place]) 07/16/2019  . Acute respiratory failure with hypoxia ([place]) 06/19/2019  . Cough with hemoptysis 06/09/2019  . Acute on chronic combined systolic and diastolic CHF (congestive heart failure) ([place]) 05/27/2019  . Insomnia 05/07/2019  . Hypokalemia 03/23/2019  . Hyperglycemia, drug-induced 03/23/2019  . COVID-19 virus not detected 03/23/2019  . Respiratory failure ([place]) 03/16/2019  . AVM (arteriovenous malformation) of small bowel, acquired   . Anemia, iron deficiency 11/10/2018  . Chronic systolic heart failure ([place]) 11/06/2018  . Rectal polyp   . Benign neoplasm of cecum   . Barrett's esophagus without dysplasia   . Stomach irritation   . Chronic diastolic heart failure ([place]) 07/03/2018  . HTN (hypertension) 07/03/2018  . 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 B12 deficiency anemia 06/28/2018  . Hypotension 05/11/2018  . Prediabetes 05/11/2018  . Personal history of colon cancer   .  Benign neoplasm of descending colon   . Polyp of sigmoid colon   . Benign neoplasm of transverse colon   . Diverticulosis of large intestine without diverticulitis   . 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 Renal artery stenosis ([place]) 10/03/2016  . 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 Hospital discharge follow-up 02/09/2015  . Cough in adult 10/21/2014  . Tobacco abuse 10/11/2014  .  Tobacco abuse counseling 10/11/2014  . Chronic right hip pain 08/26/2014  . Atherosclerosis of native artery of extremity with intermittent claudication ([place]) 06/18/2013  . Preoperative evaluation to rule out surgical contraindication 06/18/2013  . CAD (coronary artery disease) 06/01/2013  . GERD (gastroesophageal reflux disease) 06/01/2013  . Hypercholesterolemia 06/01/2013  . Tubular adenoma of colon 06/01/2013  . 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 [address] Phone: [phone] Fax: [phone]  [facility] [place], [address] [place] [address] Phone: [phone] Fax: [phone]  [facility] [place], [address] [place] [address] Phone: [phone] Fax: [phone]     Social Determinants of Health (SDOH) Interventions    Readmission Risk Interventions Readmission Risk Prevention Plan 08/13/2019 07/17/2019 05/30/2019  Transportation Screening Complete Complete Complete  PCP or Specialist Appt within 3-5 Days - - Complete  HRI or Home Care Consult - - Complete  Palliative Care Screening - - Not Applicable  Medication Review (RN Care Manager) Complete Complete Complete  PCP or Specialist appointment within 3-5 days of discharge Complete Complete -  [place] or Home Care Consult Complete Complete -  SW Recovery Care/Counseling Consult Complete Complete -  Palliative Care Screening Not Applicable Not  Applicable -  [place] Not Applicable Not Applicable -  Some recent data might be hidden

## 2019-08-27 ENCOUNTER — Other Ambulatory Visit: Payer: Self-pay | Admitting: Internal Medicine

## 2019-08-27 ENCOUNTER — Encounter: Payer: Self-pay | Admitting: Cardiology

## 2019-08-27 MED ORDER — SPIRONOLACTONE 25 MG PO TABS
25.0000 mg | ORAL_TABLET | Freq: Every day | ORAL | Status: DC
  Administered 2019-08-27: 25 mg via ORAL
  Filled 2019-08-27: qty 1

## 2019-08-27 MED ORDER — CARVEDILOL 6.25 MG PO TABS
6.2500 mg | ORAL_TABLET | Freq: Two times a day (BID) | ORAL | Status: DC
  Administered 2019-08-27: 6.25 mg via ORAL
  Filled 2019-08-27 (×2): qty 1

## 2019-08-27 MED ORDER — FUROSEMIDE 10 MG/ML IJ SOLN
40.0000 mg | Freq: Once | INTRAMUSCULAR | Status: AC
  Administered 2019-08-27: 40 mg via INTRAVENOUS
  Filled 2019-08-27: qty 4

## 2019-08-27 MED ORDER — CARVEDILOL 6.25 MG PO TABS: 6.2500 mg | ORAL_TABLET | Freq: Two times a day (BID) | ORAL | 0 refills | Status: DC

## 2019-08-27 MED ORDER — FUROSEMIDE 40 MG PO TABS: 40.0000 mg | ORAL_TABLET | Freq: Two times a day (BID) | ORAL | Status: DC

## 2019-08-27 NOTE — Progress Notes (Signed)
IVs removed before discharge. Eduucated patient on CHF and medications. Patient stated that she understood and did not have any questions.

## 2019-08-27 NOTE — Progress Notes (Signed)
[facility]  SUBJECTIVE: Patient laying in bed, denies chest pain, breathing improved   Vitals:   08/26/19 1449 08/26/19 1544 08/26/19 2019 08/27/19 0400  BP: 135/86 117/75 128/79 (!) 141/82  Pulse: 79 77 78 80  Resp: 18 19 18 18   Temp: 98.6 F (37 C) (!) 97.5 F (36.4 C) 97.9 F (36.6 C) 98.2 F (36.8 C)  TempSrc:  Oral  Oral  SpO2: 94% 99% 96% 96%  Weight:      Height:         Intake/Output Summary (Last 24 hours) at 08/27/2019 0804 Last data filed at 08/26/2019 1700 Gross per 24 hour  Intake 256 ml  Output 250 ml  Net 6 ml      PHYSICAL EXAM  General: Well developed, well nourished, in no acute distress HEENT:  Normocephalic and atramatic Neck:  No JVD.  Lungs: Clear bilaterally to auscultation and percussion. Heart: HRRR . Normal S1 and S2 without gallops or murmurs.  Abdomen: Bowel sounds are positive, abdomen soft and non-tender  Msk:  Back normal, normal gait. Normal strength and tone for age. Extremities: No clubbing, cyanosis or edema.   Neuro: Alert and oriented X 3. Psych:  Good affect, responds appropriately   LABS: Basic Metabolic Panel: Recent Labs    08/25/19 0050 08/26/19 0449  NA 135 136  K 4.1 4.5  CL 100 102  CO2 23 23  GLUCOSE 273* 206*  BUN 32* 40*  CREATININE 1.34* 1.10*  CALCIUM 8.6* 8.9  MG 1.9  --   PHOS 2.9  --    Liver Function Tests: Recent Labs    08/24/19 0906 08/26/19 0449  AST 20 18  ALT 16 16  ALKPHOS 92 61  BILITOT 0.9 0.6  PROT 7.4 6.6  ALBUMIN 4.1 3.7   No results for input(s): LIPASE, AMYLASE in the last 72 hours. CBC: Recent Labs    08/24/19 0906 08/25/19 0050  WBC 23.0* 15.4*  NEUTROABS 18.7*  --   HGB 13.8 11.5*  HCT 41.7 34.5*  MCV 95.4 94.8  PLT 463* 257   Cardiac Enzymes: No results for input(s): CKTOTAL, CKMB, CKMBINDEX, TROPONINI in the last 72 hours. BNP: Invalid input(s): POCBNP D-Dimer: No results for input(s): DDIMER in the last 72 hours. Hemoglobin A1C: No results for  input(s): HGBA1C in the last 72 hours. Fasting Lipid Panel: No results for input(s): CHOL, HDL, LDLCALC, TRIG, CHOLHDL, LDLDIRECT in the last 72 hours. Thyroid Function Tests: No results for input(s): TSH, T4TOTAL, T3FREE, THYROIDAB in the last 72 hours.  Invalid input(s): FREET3 Anemia Panel: No results for input(s): VITAMINB12, FOLATE, FERRITIN, TIBC, IRON, RETICCTPCT in the last 72 hours.  No results found.   Echo LVEF 40 to 45%, moderate to severe mitral regurgitation, moderate to severe mitral stenosis 05/27/2019  TELEMETRY: Sinus rhythm:  ASSESSMENT AND PLAN:  Active Problems:   Acute respiratory failure ([place])    1.  Respiratory failure, multifactorial, secondary to COPD, possible community-acquired pneumonia, acute on chronic systolic congestive heart failure, improved after diuresis, bronchodilator therapy and broad-spectrum antibiotics 2.  Coronary artery disease, patent LIMA to LAD, occluded SVG to distal RCA and OM1 cardiac catheterization 08/26/2019 3.  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  Moderate to severe pulmonary hypertension, PA mean 55 mmHg 5.  Moderate mitral regurgitation and mitral stenosis of uncertain clinical significance in the setting of COPD and  pulmonary hypertension  Recommendations  1.  Agree with overall current therapy 2.  Continue dual antiplatelet therapy 3.  Continue carvedilol, Entresto, spironolactone, furosemide 4.  Consider outpatient cardiac MRI to further delineate mitral regurgitation and mitral stenosis 5.  Follow-up as outpatient in 1 week  Isaias Cowman, MD, PhD, [facility] 08/27/2019 8:04 AM

## 2019-08-27 NOTE — Care Management Important Message (Signed)
Important Message  Patient Details  Name: Ashley [name] MRN: [id number] Date of Birth: 07-18-1951   Medicare Important Message Given:  Yes     Juliann Pulse A Karyssa Amaral 08/27/2019, 10:22 AM

## 2019-08-27 NOTE — Discharge Summary (Signed)
[facility] [place] NAME: Ashley [name]    MR#:  [id number]  DATE OF BIRTH:  08-20-1951  DATE OF ADMISSION:  08/24/2019 ADMITTING PHYSICIAN: Saundra Shelling, MD  DATE OF DISCHARGE: 08/27/2019  PRIMARY CARE PHYSICIAN: [name], MD    ADMISSION DIAGNOSIS:  SOB (shortness of breath) [R06.02]  DISCHARGE DIAGNOSIS:  Acute respiratory failure secondary to flash pulmonary edema in the setting of acute on chronic systolic congestive heart failure and MS/MR  SECONDARY DIAGNOSIS:   Past Medical History:  Diagnosis Date  . AICD (automatic cardioverter/defibrillator) present    on right side  . Anemia   . CAD (coronary artery disease)    s/p CABG  . CHF (congestive heart failure) ([place])   . Chronic kidney disease    renal artery stenosis  . Colon cancer ([place])   . Depression   . GERD (gastroesophageal reflux disease)   . Hx of colonic polyps   . Hyperlipidemia   . Hypertension   . Mitral valve disorder    s/p mitral valve repair wth CABG  . Myocardial infarction ([place])   . Peripheral vascular disease ([place])   . Presence of permanent cardiac pacemaker    Pacemaker/ Defibrillator    HOSPITAL COURSE:   68 year old female with past medical history of coronary artery disease, CKD stage III, previous history of AICD, hypertension, hyperlipidemia, history of colon cancer who presented to the hospital due to shortness of breath.  1. Acute respiratory failure with hypoxia-secondary to CHF acute on chronic systolic in the setting of severe mitral valve regurgitation with history of mitral valve stenosis and CAD -Dr. Saralyn Pilar to see patient-s/p cath1.  Severe three-vessel coronary artery disease with 90% stenosis proximal LAD, 100% stenosis mid left circumflex, 100% stenosis proximal RCA 2.  Patent LIMA to LAD, occluded SVG to distal RCA and OM1 3.  Dilated cardiomyopathy with severely reduced left ventricular function, LVEF 20 to  25% 4.  Moderate to severe pulmonary hypertension 5.  Moderate mitral regurgitation 6.  Moderate mitral stenosis Recommendations 1.  Continue medical therapy 2.  Aggressive risk factor modification ( tobacco cessation ) -Patient was placed on BiPAP. She feels a lot better. Continue po Lasix. -Per patient this is her fourth admission since July with flash pulmonary edema -I have asked patient to keep her appointment  2. CHF-acute on chronic systolic dysfunction. Patient's previous echocardiogram showed EF of 45%-- now EF 20-25% per cath -Patient was diuresed with IV Lasix and responded well to it.  -Patient will continue her oral Lasix, Entresto, carvedilol and Aldactone  -await further recs from Dr Parachos--Cardiac MRI will be scheduled at [facility]  3. COPD stable -continue PRN nebs and inhalers  4. Hyperlipidemia - cont. Atorvastatin.  5. Depression - cont. Lexapro.   6. Anxiety - cont. PRN Xanax.  7. GERD - Pt. Will cont. Omeprazole  Overall at baseline. Patient will discharged to home. She has all her medications, appointment for heart failure clinic, will follow-up with Dr. Saralyn Pilar. She is also advised on low salt diet and smoking cessation which she tells me she is quit smoking August 2020. CONSULTS OBTAINED:  Treatment Team:  Isaias Cowman, MD  DRUG ALLERGIES:   Allergies  Allergen Reactions  . Vancomycin Nausea And Vomiting and Palpitations  . Hydrocodone-Acetaminophen Nausea Only    DISCHARGE MEDICATIONS:   Allergies as of 08/27/2019      Reactions   Vancomycin Nausea And Vomiting, Palpitations   Hydrocodone-acetaminophen Nausea  Only      Medication List    TAKE these medications   acetaminophen 500 MG tablet Commonly known as: TYLENOL Maximum 6 tablets a day. What changed:   how much to take  how to take this  when to take this  reasons to take this   albuterol 108 (90 Base) MCG/ACT inhaler Commonly known as: Ventolin  HFA INHALE 2 PUFFS BY MOUTH INTO THE LUNGS EVERY 6 HOURS AS NEEDED FOR WHEEZING What changed:   how much to take  how to take this  when to take this  reasons to take this   ALPRAZolam 0.5 MG tablet Commonly known as: XANAX Take 0.5-1 tablets (0.25-0.5 mg total) by mouth at bedtime. TAKE ONE TABLET BY MOUTH AT BEDTIME AS NEEDED FOR ANXIETY   aspirin 81 MG tablet Take 81 mg by mouth daily.   atorvastatin 40 MG tablet Commonly known as: LIPITOR TAKE ONE TABLET BY MOUTH ONCE DAILY   carvedilol 6.25 MG tablet Commonly known as: COREG Take 1 tablet (6.25 mg total) by mouth 2 (two) times daily with a meal. What changed:   medication strength  how much to take   clopidogrel 75 MG tablet Commonly known as: PLAVIX Take 1 tablet (75 mg total) by mouth daily.   cyanocobalamin 1000 MCG/ML injection Commonly known as: (VITAMIN B-12) Inject 1 mL weekly into the muscle monthly What changed:   how much to take  how to take this  when to take this  additional instructions   Diphenhist 25 mg capsule Generic drug: diphenhydrAMINE Take 25 mg by mouth at bedtime as needed for allergies or sleep.   Entresto 24-26 MG Generic drug: sacubitril-valsartan Take 1 tablet by mouth 2 (two) times daily.   escitalopram 10 MG tablet Commonly known as: LEXAPRO Take 1 tablet (10 mg total) by mouth daily.   ferrous sulfate 325 (65 FE) MG EC tablet Take 325 mg by mouth daily with breakfast.   furosemide 40 MG tablet Commonly known as: LASIX One tablet twice a day What changed:   how much to take  how to take this  when to take this  additional instructions   ipratropium-albuterol 0.5-2.5 (3) MG/3ML Soln Commonly known as: DUONEB Take 3 mLs by nebulization every 6 (six) hours as needed. What changed: reasons to take this   nitroGLYCERIN 0.4 MG SL tablet Commonly known as: NITROSTAT Place 1 tablet (0.4 mg total) under the tongue every 5 (five) minutes as needed for chest  pain.   omeprazole 40 MG capsule Commonly known as: PRILOSEC Take 1 capsule (40 mg total) by mouth daily.   potassium chloride 10 MEQ tablet Commonly known as: KLOR-CON Take 1 tablet (10 mEq total) by mouth daily.   spironolactone 25 MG tablet Commonly known as: Aldactone Take 0.5 tablets (12.5 mg total) by mouth daily.   traZODone 50 MG tablet Commonly known as: DESYREL Take 0.5-1 tablets (25-50 mg total) by mouth daily. One hour before bedtime   Trelegy Ellipta 100-62.5-25 MCG/INH Aepb Generic drug: Fluticasone-Umeclidin-Vilant Inhale 1 puff into the lungs daily.       If you experience worsening of your admission symptoms, develop shortness of breath, life threatening emergency, suicidal or homicidal thoughts you must seek medical attention immediately by calling 911 or calling your MD immediately  if symptoms less severe.  You Must read complete instructions/literature along with all the possible adverse reactions/side effects for all the Medicines you take and that have been prescribed to you. Take any  new Medicines after you have completely understood and accept all the possible adverse reactions/side effects.   Please note  You were cared for by a hospitalist during your hospital stay. If you have any questions about your discharge medications or the care you received while you were in the hospital after you are discharged, you can call the unit and asked to speak with the hospitalist on call if the hospitalist that took care of you is not available. Once you are discharged, your primary care physician will handle any further medical issues. Please note that NO REFILLS for any discharge medications will be authorized once you are discharged, as it is imperative that you return to your primary care physician (or establish a relationship with a primary care physician if you do not have one) for your aftercare needs so that they can reassess your need for medications and monitor  your lab values. Today   SUBJECTIVE  Feels ok. Earlier some mild sob. sats 96% on ra. No cp or wheezing   VITAL SIGNS:  Blood pressure (!) 141/82, pulse 80, temperature 98.2 F (36.8 C), temperature source Oral, resp. rate 18, height 5\' 4"  (1.626 m), weight 73.5 kg, SpO2 96 %.  I/O:    Intake/Output Summary (Last 24 hours) at 08/27/2019 0854 Last data filed at 08/26/2019 1700 Gross per 24 hour  Intake 256 ml  Output 250 ml  Net 6 ml    PHYSICAL EXAMINATION:  GENERAL:  68 y.o.-year-old patient lying in the bed with no acute distress.  EYES: Pupils equal, round, reactive to light and accommodation. No scleral icterus. Extraocular muscles intact.  HEENT: Head atraumatic, normocephalic. Oropharynx and nasopharynx clear.  NECK:  Supple, no jugular venous distention. No thyroid enlargement, no tenderness.  LUNGS: Normal breath sounds bilaterally, no wheezing, rales,rhonchi or crepitation. No use of accessory muscles of respiration.  CARDIOVASCULAR: S1, S2 normal. No murmurs, rubs, or gallops.  ABDOMEN: Soft, non-tender, non-distended. Bowel sounds present. No organomegaly or mass.  EXTREMITIES: No pedal edema, cyanosis, or clubbing.  NEUROLOGIC: Cranial nerves II through XII are intact. Muscle strength 5/5 in all extremities. Sensation intact. Gait not checked.  PSYCHIATRIC: The patient is alert and oriented x 3.  SKIN: No obvious rash, lesion, or ulcer.   DATA REVIEW:   CBC  Recent Labs  Lab 08/25/19 0050  WBC 15.4*  HGB 11.5*  HCT 34.5*  PLT 257    Chemistries  Recent Labs  Lab 08/25/19 0050 08/26/19 0449  NA 135 136  K 4.1 4.5  CL 100 102  CO2 23 23  GLUCOSE 273* 206*  BUN 32* 40*  CREATININE 1.34* 1.10*  CALCIUM 8.6* 8.9  MG 1.9  --   AST  --  18  ALT  --  16  ALKPHOS  --  61  BILITOT  --  0.6    Microbiology Results   Recent Results (from the past 240 hour(s))  SARS Coronavirus 2 by RT PCR (hospital order, performed in [facility] hospital lab)  Nasopharyngeal Nasopharyngeal Swab     Status: None   Collection Time: 08/24/19  9:06 AM   Specimen: Nasopharyngeal Swab  Result Value Ref Range Status   SARS Coronavirus 2 NEGATIVE NEGATIVE Final    Comment: (NOTE) If result is NEGATIVE SARS-CoV-2 target nucleic acids are NOT DETECTED. The SARS-CoV-2 RNA is generally detectable in upper and lower  respiratory specimens during the acute phase of infection. The lowest  concentration of SARS-CoV-2 viral copies this assay can detect  is 250  copies / mL. A negative result does not preclude SARS-CoV-2 infection  and should not be used as the sole basis for treatment or other  patient management decisions.  A negative result may occur with  improper specimen collection / handling, submission of specimen other  than nasopharyngeal swab, presence of viral mutation(s) within the  areas targeted by this assay, and inadequate number of viral copies  (<250 copies / mL). A negative result must be combined with clinical  observations, patient history, and epidemiological information. If result is POSITIVE SARS-CoV-2 target nucleic acids are DETECTED. The SARS-CoV-2 RNA is generally detectable in upper and lower  respiratory specimens dur ing the acute phase of infection.  Positive  results are indicative of active infection with SARS-CoV-2.  Clinical  correlation with patient history and other diagnostic information is  necessary to determine patient infection status.  Positive results do  not rule out bacterial infection or co-infection with other viruses. If result is PRESUMPTIVE POSTIVE SARS-CoV-2 nucleic acids MAY BE PRESENT.   A presumptive positive result was obtained on the submitted specimen  and confirmed on repeat testing.  While 2019 novel coronavirus  (SARS-CoV-2) nucleic acids may be present in the submitted sample  additional confirmatory testing may be necessary for epidemiological  and / or clinical management purposes  to  differentiate between  SARS-CoV-2 and other Sarbecovirus currently known to infect humans.  If clinically indicated additional testing with an alternate test  methodology [phone]) is advised. The SARS-CoV-2 RNA is generally  detectable in upper and lower respiratory sp ecimens during the acute  phase of infection. The expected result is Negative. Fact Sheet for Patients:  StrictlyIdeas.no Fact Sheet for Healthcare Providers: BankingDealers.co.za This test is not yet approved or cleared by the [place] FDA and has been authorized for detection and/or diagnosis of SARS-CoV-2 by FDA under an Emergency Use Authorization (EUA).  This EUA will remain in effect (meaning this test can be used) for the duration of the COVID-19 declaration under Section 564(b)(1) of the Act, 21 U.S.C. section 360bbb-3(b)(1), unless the authorization is terminated or revoked sooner. Performed at [facility], [address] [place], [place] [address]   MRSA PCR Screening     Status: None   Collection Time: 08/24/19  1:30 PM   Specimen: Nasal Mucosa; Nasopharyngeal  Result Value Ref Range Status   MRSA by PCR NEGATIVE NEGATIVE Final    Comment:        The GeneXpert MRSA Assay (FDA approved for NASAL specimens only), is one component of a comprehensive MRSA colonization surveillance program. It is not intended to diagnose MRSA infection nor to guide or monitor treatment for MRSA infections. Performed at [facility], [address] [place], [place] [address]   Respiratory Panel by PCR     Status: None   Collection Time: 08/24/19  2:31 PM   Specimen: Nasopharyngeal Swab; Respiratory  Result Value Ref Range Status   Adenovirus NOT DETECTED NOT DETECTED Final   Coronavirus 229E NOT DETECTED NOT DETECTED Final    Comment: (NOTE) The Coronavirus on the Respiratory Panel, DOES NOT test for the novel  Coronavirus (2019 nCoV)     Coronavirus HKU1 NOT DETECTED NOT DETECTED Final   Coronavirus NL63 NOT DETECTED NOT DETECTED Final   Coronavirus OC43 NOT DETECTED NOT DETECTED Final   Metapneumovirus NOT DETECTED NOT DETECTED Final   Rhinovirus / Enterovirus NOT DETECTED NOT DETECTED Final   Influenza A NOT DETECTED NOT DETECTED  Final   Influenza B NOT DETECTED NOT DETECTED Final   Parainfluenza Virus 1 NOT DETECTED NOT DETECTED Final   Parainfluenza Virus 2 NOT DETECTED NOT DETECTED Final   Parainfluenza Virus 3 NOT DETECTED NOT DETECTED Final   Parainfluenza Virus 4 NOT DETECTED NOT DETECTED Final   Respiratory Syncytial Virus NOT DETECTED NOT DETECTED Final   Bordetella pertussis NOT DETECTED NOT DETECTED Final   Chlamydophila pneumoniae NOT DETECTED NOT DETECTED Final   Mycoplasma pneumoniae NOT DETECTED NOT DETECTED Final    Comment: Performed at [facility], [place] [address] [place], [place] [address]    RADIOLOGY:  No results found.   CODE STATUS:     Code Status Orders  (From admission, onward)         Start     Ordered   08/24/19 1330  Full code  Continuous     08/24/19 1329        Code Status History    Date Active Date Inactive Code Status Order ID Comments User Context   08/12/2019 0953 08/15/2019 1503 Full Code [id number]  Dustin Flock, MD ED   07/16/2019 1508 07/19/2019 1445 Full Code [id number]  Hillary Bow, MD ED   06/19/2019 1430 06/21/2019 1804 Full Code [id number]  Henreitta Leber, MD ED   05/27/2019 0630 05/31/2019 2111 Full Code [id number]  Mansy, Arvella Merles, MD ED   03/16/2019 1354 03/17/2019 1617 Full Code [id number]  Salary, Avel Peace, MD Inpatient   07/17/2018 1214 07/19/2018 1548 Full Code [id number]  Dustin Flock, MD ED   10/23/2016 0917 10/23/2016 1630 Full Code [id number]  Algernon Huxley, MD Inpatient   06/21/2016 0437 06/21/2016 1758 Full Code [id number]  Harrie Foreman, MD Inpatient   Advance Care Planning Activity      TOTAL TIME TAKING CARE OF THIS PATIENT: *40* minutes.     Fritzi Mandes M.D on 08/27/2019 at 8:54 AM  Between 7am to 6pm - Pager - [phone] After 6pm go to [email or web address] - password [email or web address] [place] [facility]  Office  [phone]  CC: Primary care physician; [name], MD

## 2019-08-28 ENCOUNTER — Telehealth: Payer: Self-pay

## 2019-08-28 ENCOUNTER — Other Ambulatory Visit: Payer: Self-pay | Admitting: *Deleted

## 2019-08-28 NOTE — Telephone Encounter (Signed)
Transition Care Management Follow-up Telephone Call   Date discharged? 08/27/19   How have you been since you were released from the hospital? Patient states, "I am doing okay, but tired." Denies SOB/wheezing/dizziness/chest pain and all other symptoms.    Do you understand why you were in the hospital? Yes, shortness of breath.   Do you understand the discharge instructions? Yes, increase activity as tolerated. Follow up with pcp, [facility] CHF clinic, and cardiology.   Where were you discharged to? Home.    Items Reviewed:  Medications reviewed: Yes, taking scheduled medications as directed. Increase coreg 6.25mg .  Allergies reviewed: Yes, none new.  Dietary changes reviewed: Yes, heart healthy/low sodium  Referrals reviewed: Yes, appointment scheduled with cardiology 09/03/19 @ 3:00, CHF heart clinic 09/04/19 @ 1030.    Functional Questionnaire:   Activities of Daily Living (ADLs):   She states they are independent in the following: Independent in all ADLs States they require assistance with the following: She does not require assistance at this time.    Any transportation issues/concerns?: None at this time.   Any patient concerns? No.   Confirmed importance and date/time of follow-up visits scheduled Yes, appointment scheduled before discharge 09/03/19 @ 11:00. Will as telephone [phone] or r/s to another date for in office.   Provider Appointment booked with  Confirmed with patient if condition begins to worsen call PCP or go to the ER.  Patient was given the office number and encouraged to call back with question or concerns.  : Yes.

## 2019-08-28 NOTE — Patient Outreach (Signed)
[facility]) Care Management  08/28/2019  Ashley [name] Jan 02, 1951 [id number]   Telephone assessment  Covering for assigned Care Coordinator   Patient is 68 year old female followed by [facility] Management for Complex Case management .   Recent Hospital readmission on 10/18-10/21/20  Dx: Acute Respiratory Failure, Acute on Chronic Heart Failure   Patient medical history includes but not limited to Combined Heart failure , CAD, previous CABG/MI, GERD, COPD.    Subjective : Successful outreach call to patient, introduced self and explained purpose of the cal..   Patient discussed that she is doing okay but just a little tired and that it takes her a few days to get adjusted after being in the hospital. Patient reports that she slept well on last night, with her usual 2 pillows.  She reports her weight today is 163 lbs, she denies having any swelling , chest pain, or  increase in shortness of breath , "it takes me day or so to get back", she denies having to use prn nebulizer of inhaler.  Patient discussed her quit smoking date as being June 19 2019.  Reinforced worsening symptoms to notify MD of sudden weight gains, shortness of breath swelling ,following action plan, she states some time symptoms just sneak up on her. She voiced that she continues to limit salt in her diet .   Medications  Patient confirms being aware of recent change in medication at discharge of Carvedilol strength 6.25 twice daily, patient reports being on this dose in the past and having on hand and will pick up new prescription on today. Patient reports using a pill organizer to manage her daily medications.  Patient was recently discharged from hospital and all medications have been reviewed.  Follow up medical appointments  Patient discussed upcoming medical appointments Dr. Derrel Nip , 10/28 [place] 10/28 Heart failure Clinic 10/29   Patient denies any other concerns at this time,  Verified patient has [place] management and Coordinator contact information if new concerns arise prior to next outreach .   Plan  Will update assigned care coordinator update , reminded patient of scheduled call from Care Coordinator on next week.    [facility] CM Care Plan Problem One     Most Recent Value  Care Plan Problem One  High risk for hospital readmission related to/ as evidenced by recent hospitalization for Acute respiratory failure 10/18-10/21/20  Role Documenting the Problem One  Care Management [place] for Problem One  Active  [facility] Long Term Goal   Over the next 31 days, patient will not experience unplanned hospital readmission, as evidenced by patient reporting and review of EMR during [facility] RN CCM outreach [goal date reestablished after readmission . ]  [facility] Long Term Goal Start Date  08/28/19  Interventions for Problem One Long Term Goal  Reviewed with patient recent hospital admission and current clinical state, reviewed discharge instructions with medication review. Discusssed management of heart failure, notifying MD sooner for worsening symptoms, taking medications daily as prescribed keeping all MD appointments, limiting salt in diet.   [facility] CM Short Term Goal #1   Over the next 30 days patient will be able to report continuing to monitor daily weights   [facility] CM Short Term Goal #1 Start Date  08/28/19  Interventions for Short Term Goal #1  Reviewed with patient current weight, reinforced continuing to monitor daily, reviewed worsening yellow zone symptoms of heart failure and action plan , review of weight  gain parameters to notify MD of     [place] Short Term Goal #2   Member will report keeping and attending follow up appointments within the next 2 weeks  [facility] CM Short Term Goal #2 Start Date  08/28/19 Barrie Folk reestablished after recent discharge ]  Interventions for Short Term Goal #2  Reviewed upcoming appointments date/times,confirmed patient has transporation ,  Discussed importance of prompt follow up after discharge for review of medical plan      Joylene Draft, RN, [place] Management Coordinator  [phone]- Mobile [phone]- [place]

## 2019-09-01 ENCOUNTER — Encounter: Payer: Self-pay | Admitting: *Deleted

## 2019-09-01 ENCOUNTER — Other Ambulatory Visit: Payer: Self-pay | Admitting: *Deleted

## 2019-09-01 NOTE — Patient Outreach (Signed)
[facility]) Care Management [place] Telephone Outreach PCP office completes Transition of Care follow up post-hospital discharge Post (most recent) hospital discharge day # 5  09/01/2019  Ashley [name] Sep 02, 1951 [id number]  Successful telephone outreach to Ashley [name], 68 y/o femalereferred to [place] by [facility] Liaison RN CM after recent hospitalization July 21-25, 2020 for acute on chronic CHF exacerbation.Patientwas discharged from hospital to home/ self-care without home health services in place. Patient has history including, but not limited to, combined CHF; CAD with previous MI/ CABG, and ICD; HTN/ HLD; GERD; COPD; history of tobacco use- patient has quit smoking since June 19, 2019.  Unfortunately, patient experiencedmultiple recenthospital readmissions: 1)August 13-15, 2020 for acute respiratory failure with hypoxia/18 days after her hospital discharge, ; patient was again discharged home without home health services in place. 2) September 9-12, 2020 for acute pulmonary edema; respiratory failure with hypoxia; and CHF exacerbation/ 25 days after previous hospital discharge; was again discharged home without home health services in place, as patient continually refuses need for home health. 3) ED visit 08/02/2019 for chest pain; patient was also diagnosed with ETOH intoxication and discharged homeafter being ruled out for ACS/ MI 4) October 6-9, 2020, 24 days after her last hospital discharge, again for COPD/ CHF exacerbation. 5) October 18-21, 2020, 9 days after last hospital discharge; Acute Respiratory failure with flash pulmonary edema, secondary to CHF exacerbation/ severe MS/ MR: patient had cardiac catherization during this hospital admission and was determined to have significant 3-vessel CAD and EF of 20-25%  HIPAA/ identity verified with patient; pleasant 25 minute phone call.  Today, patient again reports "doing  good today," post-most recent hospital discharge; she denies pain and new/ recent falls and sounds to be in no apparent distress throughout phone call today.  Patient further reports: -- has all medications and is taking as prescribed; denies concerns around medications; states that hospital doctor advised her to take "extra dose" of daily diuretic although I see no written instructions regarding same on hospital discharge notes; confirmed that patient is actually taking lasix 3 times per day post hospital discharge on 08/27/2019- medication list updated in EMR according to patient report today; provided education around possibility of electrolyte imbalance/ potential for dehydration with increased doses of diuretics and strongly encouraged patient to share this information with her providers at time of scheduled provider appointments this week; patient is agreeable and states she will do   -- has continued not smoking- positive reinforcement provided  -- verbalizes accurate report of upcoming provider appointments; states plans to attend all; states she will continue to drive self to all appointments  -- states went to attend previously scheduled cardiology provider appointment on 08/22/2019 and "was turned away" at the office, due to her provider "being called out on an emergency:" this appointment is now scheduled for 09/03/2019: patient was coached around physiology of ejection fraction and need to discuss plan of action to address her cardiac catherization results from most recent hospitalization which indicate severe (new) 3-vessel CAD along with significantly decreased EF post- last ECHOcardiogram performed on 05/27/2019 during hospitalization  -- continues to deny community resource needs/ need for home health services (refused at time of last hospital discharge)  -- continues able to accurately verbalize weight gain guidelines in setting of CHF, along with signs/ symptoms yellow CHF zone and  corresponding action plan; reports daily weights at home post-most recent hospitalization of "160-162 lbs" WITH taking "extra" dose of diuretic (from  BID to TID)  -- continues monitoring SaO2 levels and does so while I am on phone: reports all values "94-95%;" denies issues with breathing and denies need to use nebulizer post- most recent hospitalization; reports uses maintenance and rescue inhalers "once a day"  Patient denies further issues, concerns, or problems today. I confirmed that patient hasmy direct phone number, the main [facility] CM office phone number, and the [facility] CM 24-hour nurse advice phone number should issues arise prior to next scheduled [place] outreach.  Encouraged patient to contact me directly if needs, questions, issues, or concerns arise prior to next scheduled outreach; patient agreed to do so.  Plan:  Patient will take medications as prescribed and will attend all scheduled provider appointments  Patient will promptly notify care providers for any new concerns/ issues/ problems that arise  Patient will continue monitoring/ recording daily weights and following action plan for CHF zones  Patient will continue not smoking  [facility] CM outreach to continue with scheduled phone callnext week post-upcoming scheduled provider appointments  [facility] CM Care Plan Problem One     Most Recent Value  Care Plan Problem One  High risk for hospital readmission related to/ as evidenced by multiple recent hospitalizations for Acute Respiratory Failure/ CHF exacerbation with most recent hospital discharge on 08/27/2019 [problem re-stated]  Role Documenting the Problem One  Care Management Coordinator  Care Plan for Problem One  Active  [facility] Long Term Goal   Over the next 31 days, patient will not experience unplanned hospital readmission, as evidenced by patient reporting and review of EMR during [facility] RN CCM outreach  [facility] Long Term Goal Start Date  08/28/19   Interventions for Problem One Long Term Goal  Discussed with patient her most recent hospital admission/ discharge instructions,  discussed current clinical condition with patient and confirmed that she does not have current clinical concerns  [facility] CM Short Term Goal #1   Over the next 30 days patient will be able to report continuing to monitor daily weights   [facility] CM Short Term Goal #1 Start Date  08/28/19  [facility] CM Short Term Goal #1 Met Date  09/01/19 [Goal addressed in problem two, below]  [facility] CM Short Term Goal #2   Member will report keeping and attending follow up appointments within the nexft 3 weeks  [facility] CM Short Term Goal #2 Start Date  08/18/19  [facility] CM Short Term Goal #2 Met Date  09/01/19 [Goal addressed in problem 2 below]    [facility] CM Care Plan Problem Two     Most Recent Value  Care Plan Problem Two  Need for ongoing reinforcement of self-health management strategies for chronic disease state of CHF/ COPD, as evidenced by patient reporting and recent hospitalization for CHF exacerbation  Role Documenting the Problem Two  Care Management Coordinator  Care Plan for Problem Two  Active  Interventions for Problem Two Long Term Goal   Confirmed that patient remains able to verbalize weight gain guidelines in setting of CHF and signs/ symptoms yellow CHF zone, along with appropriate action plan,  reviewed with patient her recent weights at home post-most recent hospital discharge and her use of rescue diuretic for weight gain  [facility] Long Term Goal  Over the next 31 days, patient will continue to monitor and record daily weights at home and will follow action plan for yellow CHF zone, as evidenced by patient reporting and review of same during [place] RN CM outreach  [facility] Long Term Goal Start Date  08/18/19  [facility] CM Short Term Goal #1   Over the next 21 days, patient will schedule/ attend follow up appointment with cardiologist, as evidenced by patient reporting and review of EMR during [facility] RN  CM outreach  [facility] CM Short Term Goal #1 Start Date  09/01/19 [Goal extended today after most recent hospitalization]  Interventions for Short Term Goal #2   Reviewed with patient upcoming scheduled provider appointments and confirmed that she continues having reliable transportation and plans to attend all,  coached patient around talking points to discuss at upcoming cardiology provider appointment around plan of care to address patient's decreased EF over last several months,  using teach back method, in layman's terms, provided education around physiology/ significance/ meaning of ejection fraction     [name], RN, BSN, [employer] Coordinator [facility] Care Management  [phone]

## 2019-09-03 ENCOUNTER — Other Ambulatory Visit: Payer: Self-pay

## 2019-09-03 ENCOUNTER — Ambulatory Visit (INDEPENDENT_AMBULATORY_CARE_PROVIDER_SITE_OTHER): Payer: Medicare Other | Admitting: Internal Medicine

## 2019-09-03 ENCOUNTER — Encounter: Payer: Self-pay | Admitting: Internal Medicine

## 2019-09-03 DIAGNOSIS — J9601 Acute respiratory failure with hypoxia: Secondary | ICD-10-CM | POA: Diagnosis not present

## 2019-09-03 DIAGNOSIS — I2510 Atherosclerotic heart disease of native coronary artery without angina pectoris: Secondary | ICD-10-CM | POA: Diagnosis not present

## 2019-09-03 DIAGNOSIS — Z09 Encounter for follow-up examination after completed treatment for conditions other than malignant neoplasm: Secondary | ICD-10-CM | POA: Diagnosis not present

## 2019-09-03 DIAGNOSIS — Z95810 Presence of automatic (implantable) cardiac defibrillator: Secondary | ICD-10-CM | POA: Diagnosis not present

## 2019-09-03 DIAGNOSIS — I739 Peripheral vascular disease, unspecified: Secondary | ICD-10-CM | POA: Diagnosis not present

## 2019-09-03 DIAGNOSIS — J449 Chronic obstructive pulmonary disease, unspecified: Secondary | ICD-10-CM | POA: Diagnosis not present

## 2019-09-03 DIAGNOSIS — I5022 Chronic systolic (congestive) heart failure: Secondary | ICD-10-CM | POA: Diagnosis not present

## 2019-09-03 DIAGNOSIS — Z951 Presence of aortocoronary bypass graft: Secondary | ICD-10-CM | POA: Diagnosis not present

## 2019-09-03 MED ORDER — NITROGLYCERIN 0.4 MG SL SUBL: 0.4000 mg | SUBLINGUAL_TABLET | SUBLINGUAL | 3 refills | Status: DC | PRN

## 2019-09-03 NOTE — Progress Notes (Signed)
Telephone  Note  This visit type was conducted due to national recommendations for restrictions regarding the COVID-19 pandemic (e.g. social distancing).  This format is felt to be most appropriate for this patient at this time.  All issues noted in this document were discussed and addressed.  No physical exam was performed (except for noted visual exam findings with Video Visits).   I connected with@ on 09/03/19 at 11:00 AM EDT by  telephone and verified that I am speaking with the correct person using two identifiers. Location patient: home Location provider: work or home office Persons participating in the virtual visit: patient, provider  I discussed the limitations, risks, security and privacy concerns of performing an evaluation and management service by telephone and the availability of in person appointments. I also discussed with the patient that there may be a patient responsible charge related to this service. The patient expressed understanding and agreed to proceed.  Reason for visit: hospital follow up  HPI:  68 yr old female with ischemic/ular valv cardiomyopathy s/p AICD, CABG x 3 wit MV repair I 2007 CKD with renal artery stenosis , COPD secondary to ongoing tobacco abuse (quit in August 2020), and recurrent admission for acute respiratory failure ,  Her 4th admission for same since July.  She was diuresed and ruled out for AMI.  Underwent diagnostic cardiac catheterization.  multivessel disease ,  Severe ,  iwith occluded grafts (2 of 3) , EF now 20 to 25%  Severe pulm htn, moderate MR and MS . Sees [facility] today for follow up on mitral valve and potential indication for surgery   Weight unchanged since discharge:  160 lbs ,  Denies dyspnea ,   Room air sats 97% .  Has back pain and leg pain with prolonged standing.  Not short of breath with bathroom activities, but notes some dizziness with backward head tilt.    Taking furosemide three times daily and only peeing once per  night ,  Spironolactone at bedtime  Has not smoked since august 13  2 pillow orthopnea , chronic   Gets headache if lies flat chronically   ROS: See pertinent positives and negatives per HPI.  Past Medical History:  Diagnosis Date  . AICD (automatic cardioverter/defibrillator) present    on right side  . Anemia   . CAD (coronary artery disease)    s/p CABG  . CHF (congestive heart failure) ([place])   . Chronic kidney disease    renal artery stenosis  . Colon cancer ([place])   . Depression   . GERD (gastroesophageal reflux disease)   . Hx of colonic polyps   . Hyperlipidemia   . Hypertension   . Mitral valve disorder    s/p mitral valve repair wth CABG  . Myocardial infarction ([place])   . Peripheral vascular disease ([place])   . Presence of permanent cardiac pacemaker    Pacemaker/ Defibrillator  . Tobacco abuse 10/11/2014    Past Surgical History:  Procedure Laterality Date  . arm surgery     fracture, has plates and screws  . CABG with mitral valve repair    . CARDIAC CATHETERIZATION    . COLON SURGERY     colon cancer  . COLONOSCOPY WITH PROPOFOL N/A 10/09/2016   Procedure: COLONOSCOPY WITH PROPOFOL;  Surgeon: Jonathon Bellows, MD;  Location: [facility] ENDOSCOPY;  Service: Endoscopy;  Laterality: N/A;  . COLONOSCOPY WITH PROPOFOL N/A 07/24/2018   Procedure: COLONOSCOPY WITH PROPOFOL;  Surgeon: Virgel Manifold, MD;  Location:  [facility];  Service: Endoscopy;  Laterality: N/A;  . CORONARY ARTERY BYPASS GRAFT    . ENTEROSCOPY N/A 11/21/2018   Procedure: ENTEROSCOPY-BALLOON;  Surgeon: Jonathon Bellows, MD;  Location: [facility] ENDOSCOPY;  Service: Gastroenterology;  Laterality: N/A;  . ESOPHAGOGASTRODUODENOSCOPY (EGD) WITH PROPOFOL N/A 07/24/2018   Procedure: ESOPHAGOGASTRODUODENOSCOPY (EGD) WITH PROPOFOL;  Surgeon: Virgel Manifold, MD;  Location: [facility] ENDOSCOPY;  Service: Endoscopy;  Laterality: N/A;  . GIVENS CAPSULE STUDY N/A 08/16/2018   Procedure: GIVENS CAPSULE STUDY;  Surgeon:  Virgel Manifold, MD;  Location: [facility] ENDOSCOPY;  Service: Endoscopy;  Laterality: N/A;  . pace maker defib  2016  . PERIPHERAL VASCULAR CATHETERIZATION N/A 10/23/2016   Procedure: Renal Angiography;  Surgeon: Algernon Huxley, MD;  Location: [facility];  Service: Cardiovascular;  Laterality: N/A;  . RIGHT/LEFT HEART CATH AND CORONARY ANGIOGRAPHY N/A 08/26/2019   Procedure: RIGHT/LEFT HEART CATH AND CORONARY ANGIOGRAPHY;  Surgeon: Isaias Cowman, MD;  Location: [place] [facility];  Service: Cardiovascular;  Laterality: N/A;  . TOTAL ABDOMINAL HYSTERECTOMY  1999   history of abnormal pap    Family History  Problem Relation Age of Onset  . Arthritis Mother   . Cancer Mother        uterus cancer  . Hyperlipidemia Mother   . Hypertension Mother   . Heart disease Mother   . Diabetes Mother   . Hyperlipidemia Father   . Hypertension Father   . Heart disease Father   . Diabetes Father   . Cancer Sister        ovary cancer  . Diabetes Maternal Grandmother   . Hypertension Maternal Grandmother   . Arthritis Maternal Grandmother   . Hypertension Maternal Grandfather   . Hypertension Paternal Grandmother   . Hypertension Paternal Grandfather   . Heart disease Paternal Grandfather   . Heart disease Brother   . Kidney disease Brother   . Breast cancer Neg Hx     SOCIAL HX:  reports that she quit smoking about 2 months ago. Her smoking use included cigarettes. She smoked 0.25 packs per day. She has never used smokeless tobacco. She reports current alcohol use of about 2.0 standard drinks of alcohol per week. She reports that she does not use drugs.   Current Outpatient Medications:  .  acetaminophen (TYLENOL) 500 MG tablet, Maximum 6 tablets a day. (Patient not taking: Reported on 09/04/2019), Disp: 42 tablet, Rfl: 0 .  ALPRAZolam (XANAX) 0.5 MG tablet, Take 0.5-1 tablets (0.25-0.5 mg total) by mouth at bedtime. TAKE ONE TABLET BY MOUTH AT BEDTIME AS NEEDED FOR ANXIETY,  Disp: 30 tablet, Rfl: 3 .  aspirin 81 MG tablet, Take 81 mg by mouth daily., Disp: , Rfl:  .  atorvastatin (LIPITOR) 40 MG tablet, TAKE ONE TABLET BY MOUTH ONCE DAILY, Disp: 90 tablet, Rfl: 1 .  carvedilol (COREG) 6.25 MG tablet, Take 1 tablet (6.25 mg total) by mouth 2 (two) times daily with a meal., Disp: 60 tablet, Rfl: 0 .  clopidogrel (PLAVIX) 75 MG tablet, Take 1 tablet (75 mg total) by mouth daily., Disp: 90 tablet, Rfl: 1 .  cyanocobalamin (,VITAMIN B-12,) 1000 MCG/ML injection, Inject 1 mL weekly into the muscle monthly, Disp: 10 mL, Rfl: 0 .  diphenhydrAMINE (DIPHENHIST) 25 mg capsule, Take 25 mg by mouth at bedtime as needed for allergies or sleep. , Disp: , Rfl:  .  escitalopram (LEXAPRO) 10 MG tablet, Take 1 tablet (10 mg total) by mouth daily., Disp: 90 tablet, Rfl: 1 .  ferrous sulfate 325 (65 FE) MG EC tablet, Take 325 mg by mouth daily with breakfast., Disp: , Rfl:  .  furosemide (LASIX) 40 MG tablet, TAKE ONE TABLET BY MOUTH TWICE DAILY (Patient taking differently: 40 mg 3 (three) times daily. ), Disp: 60 tablet, Rfl: 0 .  ipratropium-albuterol (DUONEB) 0.5-2.5 (3) MG/3ML SOLN, Take 3 mLs by nebulization every 6 (six) hours as needed. (Patient not taking: Reported on 09/04/2019), Disp: 360 mL, Rfl: 1 .  nitroGLYCERIN (NITROSTAT) 0.4 MG SL tablet, Place 1 tablet (0.4 mg total) under the tongue every 5 (five) minutes as needed for chest pain. (Patient not taking: Reported on 09/04/2019), Disp: 30 tablet, Rfl: 3 .  omeprazole (PRILOSEC) 40 MG capsule, Take 1 capsule (40 mg total) by mouth daily., Disp: 90 capsule, Rfl: 1 .  potassium chloride (K-DUR) 10 MEQ tablet, Take 1 tablet (10 mEq total) by mouth daily., Disp: 90 tablet, Rfl: 1 .  sacubitril-valsartan (ENTRESTO) 24-26 MG, Take 1 tablet by mouth 2 (two) times daily., Disp: 60 tablet, Rfl: 0 .  spironolactone (ALDACTONE) 25 MG tablet, Take 0.5 tablets (12.5 mg total) by mouth daily., Disp: 45 tablet, Rfl: 1 .  traZODone (DESYREL)  50 MG tablet, Take 0.5-1 tablets (25-50 mg total) by mouth daily. One hour before bedtime, Disp: 90 tablet, Rfl: 3 .  albuterol (VENTOLIN HFA) 108 (90 Base) MCG/ACT inhaler, INHALE 2 PUFFS BY MOUTH INTO THE LUNGS EVERY 6 HOURS AS NEEDED FOR WHEEZING, Disp: 54 g, Rfl: 2 .  Fluticasone-Umeclidin-Vilant (TRELEGY ELLIPTA) 100-62.5-25 MCG/INH AEPB, Inhale 1 puff into the lungs daily., Disp: 180 each, Rfl: 2  EXAM:   General impression: alert, cooperative and articulate.  No signs of being in distress  Lungs: speech is fluent sentence length suggests that patient is not short of breath and not punctuated by cough, sneezing or sniffing. [name]   Psych: affect normal.  speech is articulate and non pressured .  Denies suicidal thoughts   ASSESSMENT AND PLAN:  Discussed the following assessment and plan:  Acute respiratory failure with hypoxia [facility])  Hospital discharge follow-up  Acute respiratory failure (HCC) Recurrent, secondary to flash pulmonary edema..resolved and wt stable with tid lasix dosing.  awaiting cardiology's opinion on the benefit of mitral valve replacement  In this patient who has had 4 admissions since July for same.   Hospital discharge follow-up Patient is stable post discharge and has no new issues or questions about discharge plans at the visit today for hospital follow up. All labs , imaging studies and progress notes from admission were reviewed with patient today      I discussed the assessment and treatment plan with the patient. The patient was provided an opportunity to ask questions and all were answered. The patient agreed with the plan and demonstrated an understanding of the instructions.   The patient was advised to call back or seek an in-person evaluation if the symptoms worsen or if the condition fails to improve as anticipated.  I provided  25 minutes of non-face-to-face time during this encounter reviewing patient's current problems and post surgeries.   Providing counseling on the above mentioned problems , and coordination  of care .  [name], MD

## 2019-09-03 NOTE — Progress Notes (Signed)
Patient ID: Ashley [name], female    DOB: 12/26/1950, 68 y.o.   MRN: [id number]  HPI  Ashley [name] is a 68 y/o female with a history of CAD (CABG), hyperlipidemia, HTN, CKD, GERD, depression, COPD, PVD, current tobacco use and chronic heart failure.   Echo report from 05/27/2019 reviewed and showed an EF of 40-45% along with moderate/ severe MR> Echo report from 08/22/18 reviewed and showed an EF of 30% along with moderate MR. Echo report from 06/21/16 reviewed and showed an EF of 55-60% along with mild AR and moderate MR.   Catheterization done 08/26/2019 and showed:  There is moderate mitral valve stenosis and moderate (3+) mitral regurgitation.  Ost Cx to Prox Cx lesion is 90% stenosed.  Prox Cx lesion is 100% stenosed.  Prox LAD lesion is 90% stenosed.  Ost RCA to Prox RCA lesion is 100% stenosed.  Origin lesion is 100% stenosed.  Origin to Prox Graft lesion is 100% stenosed.  Hemodynamic findings consistent with severe pulmonary hypertension.   1.  Severe three-vessel coronary artery disease with 90% stenosis proximal LAD, 100% stenosis mid left circumflex, 100% stenosis proximal RCA 2.  Patent LIMA to LAD, occluded SVG to distal RCA and OM1 3.  Dilated cardiomyopathy with severely reduced left ventricular function, LVEF 20 to 25% 4.  Moderate to severe pulmonary hypertension 5.  Moderate mitral regurgitation 6.  Moderate mitral stenosis   Admitted 08/24/2019 due to acute respiratory failure due to acute on chronic heart failure. Cardiology consult obtained. Initially needed bipap and IV lasix and then was transitioned to oral diuretics. Discharged after 3 days. Admitted 08/12/2019 due to acute HF/COPD exacerbation. Cardiology consult obtained. Initially given IV lasix and then transitioned to oral diuretics after a brief hold due to worsening renal function. Also given prednisone and nebulizer therapy. Discharged after 3 days. Was in the ED 08/02/2019 due to chest pain where  she was treated and released. Admitted 07/16/2019 due to acute on chronic HF. Medication adjustments made. Discharged after 3 days.   She presents today for a follow-up visit with a chief complaint of minimal shortness of breath upon moderate exertion. She describes this as chronic in nature having been present for several years although has improved since her most recent admission. She has associated cough, fatigue and light-headedness along with this. She denies any difficulty sleeping, abdominal distention, palpitations, pedal edema, chest pain or weight gain.   Past Medical History:  Diagnosis Date  . AICD (automatic cardioverter/defibrillator) present    on right side  . Anemia   . CAD (coronary artery disease)    s/p CABG  . CHF (congestive heart failure) ([place])   . Chronic kidney disease    renal artery stenosis  . Colon cancer ([place])   . Depression   . GERD (gastroesophageal reflux disease)   . Hx of colonic polyps   . Hyperlipidemia   . Hypertension   . Mitral valve disorder    s/p mitral valve repair wth CABG  . Myocardial infarction ([place])   . Peripheral vascular disease ([place])   . Presence of permanent cardiac pacemaker    Pacemaker/ Defibrillator   Past Surgical History:  Procedure Laterality Date  . arm surgery     fracture, has plates and screws  . CABG with mitral valve repair    . CARDIAC CATHETERIZATION    . COLON SURGERY     colon cancer  . COLONOSCOPY WITH PROPOFOL N/A 10/09/2016   Procedure: COLONOSCOPY WITH PROPOFOL;  Surgeon: Jonathon Bellows, MD;  Location: [facility] ENDOSCOPY;  Service: Endoscopy;  Laterality: N/A;  . COLONOSCOPY WITH PROPOFOL N/A 07/24/2018   Procedure: COLONOSCOPY WITH PROPOFOL;  Surgeon: Virgel Manifold, MD;  Location: [facility] ENDOSCOPY;  Service: Endoscopy;  Laterality: N/A;  . CORONARY ARTERY BYPASS GRAFT    . ENTEROSCOPY N/A 11/21/2018   Procedure: ENTEROSCOPY-BALLOON;  Surgeon: Jonathon Bellows, MD;  Location[facility] ENDOSCOPY;  Service:  Gastroenterology;  Laterality: N/A;  . ESOPHAGOGASTRODUODENOSCOPY (EGD) WITH PROPOFOL N/A 07/24/2018   Procedure: ESOPHAGOGASTRODUODENOSCOPY (EGD) WITH PROPOFOL;  Surgeon: Virgel Manifold, MD;  Location: [facility] ENDOSCOPY;  Service: Endoscopy;  Laterality: N/A;  . GIVENS CAPSULE STUDY N/A 08/16/2018   Procedure: GIVENS CAPSULE STUDY;  Surgeon: Virgel Manifold, MD;  Location: [facility] ENDOSCOPY;  Service: Endoscopy;  Laterality: N/A;  . pace maker defib  2016  . PERIPHERAL VASCULAR CATHETERIZATION N/A 10/23/2016   Procedure: Renal Angiography;  Surgeon: Algernon Huxley, MD;  Location: [facility] CV LAB;  Service: Cardiovascular;  Laterality: N/A;  . RIGHT/LEFT HEART CATH AND CORONARY ANGIOGRAPHY N/A 08/26/2019   Procedure: RIGHT/LEFT HEART CATH AND CORONARY ANGIOGRAPHY;  Surgeon: Isaias Cowman, MD;  Location: [place] CV LAB;  Service: Cardiovascular;  Laterality: N/A;  . TOTAL ABDOMINAL HYSTERECTOMY  1999   history of abnormal pap   Family History  Problem Relation Age of Onset  . Arthritis Mother   . Cancer Mother        uterus cancer  . Hyperlipidemia Mother   . Hypertension Mother   . Heart disease Mother   . Diabetes Mother   . Hyperlipidemia Father   . Hypertension Father   . Heart disease Father   . Diabetes Father   . Cancer Sister        ovary cancer  . Diabetes Maternal Grandmother   . Hypertension Maternal Grandmother   . Arthritis Maternal Grandmother   . Hypertension Maternal Grandfather   . Hypertension Paternal Grandmother   . Hypertension Paternal Grandfather   . Heart disease Paternal Grandfather   . Heart disease Brother   . Kidney disease Brother   . Breast cancer Neg Hx    Social History   Tobacco Use  . Smoking status: Former Smoker    Packs/day: 0.25    Types: Cigarettes    Quit date: 06/19/2019    Years since quitting: 0.2  . Smokeless tobacco: Never Used  . Tobacco comment: reports smoking 2-3 cigarettes/ day  Substance Use Topics   . Alcohol use: Yes    Alcohol/week: 2.0 standard drinks    Types: 2 Shots of liquor per week    Comment: occasion   Allergies  Allergen Reactions  . Vancomycin Nausea And Vomiting and Palpitations  . Hydrocodone-Acetaminophen Nausea Only   Prior to Admission medications   Medication Sig Start Date End Date Taking? Authorizing Provider  acetaminophen (TYLENOL) 500 MG tablet Maximum 6 tablets a day. Patient taking differently: Take 500 mg by mouth every 4 (four) hours as needed for fever. Maximum 6 tablets a day. 02/09/15  Yes Doss, Velora Heckler, RN  albuterol (VENTOLIN HFA) 108 (90 Base) MCG/ACT inhaler INHALE 2 PUFFS BY MOUTH INTO THE LUNGS EVERY 6 HOURS AS NEEDED FOR WHEEZING Patient taking differently: Inhale 2 puffs into the lungs every 6 (six) hours as needed for wheezing or shortness of breath. INHALE 2 PUFFS BY MOUTH INTO THE LUNGS EVERY 6 HOURS AS NEEDED FOR WHEEZING 06/03/19  Yes [name], MD  ALPRAZolam Duanne Moron) 0.5 MG tablet Take  0.5-1 tablets (0.25-0.5 mg total) by mouth at bedtime. TAKE ONE TABLET BY MOUTH AT BEDTIME AS NEEDED FOR ANXIETY 02/24/19  Yes [name], MD  aspirin 81 MG tablet Take 81 mg by mouth daily.   Yes [provider]  atorvastatin (LIPITOR) 40 MG tablet TAKE ONE TABLET BY MOUTH ONCE DAILY Patient taking differently: Take 40 mg by mouth daily.  07/25/19  Yes [name], MD  carvedilol (COREG) 6.25 MG tablet Take 1 tablet (6.25 mg total) by mouth 2 (two) times daily with a meal. 08/27/19  Yes Fritzi Mandes, MD  clopidogrel (PLAVIX) 75 MG tablet Take 1 tablet (75 mg total) by mouth daily. 06/03/19  Yes [name], MD  cyanocobalamin (,VITAMIN B-12,) 1000 MCG/ML injection Inject 1 mL weekly into the muscle monthly Patient taking differently: Inject 1,000 mcg into the muscle every 30 (thirty) days.  11/08/18  Yes [name], MD  diphenhydrAMINE (DIPHENHIST) 25 mg capsule Take 25 mg by mouth at bedtime as needed for allergies or sleep.    Yes  [provider]  escitalopram (LEXAPRO) 10 MG tablet Take 1 tablet (10 mg total) by mouth daily. 06/03/19  Yes [name], MD  ferrous sulfate 325 (65 FE) MG EC tablet Take 325 mg by mouth daily with breakfast.   Yes [provider]  Fluticasone-Umeclidin-Vilant (TRELEGY ELLIPTA) 100-62.5-25 MCG/INH AEPB Inhale 1 puff into the lungs daily.   Yes [provider]  furosemide (LASIX) 40 MG tablet TAKE ONE TABLET BY MOUTH TWICE DAILY Patient taking differently: 40 mg 3 (three) times daily.  08/28/19  Yes [name], MD  ipratropium-albuterol (DUONEB) 0.5-2.5 (3) MG/3ML SOLN Take 3 mLs by nebulization every 6 (six) hours as needed. Patient taking differently: Take 3 mLs by nebulization every 6 (six) hours as needed (respiratory symptoms).  08/05/18  Yes [name], MD  nitroGLYCERIN (NITROSTAT) 0.4 MG SL tablet Place 1 tablet (0.4 mg total) under the tongue every 5 (five) minutes as needed for chest pain. 09/03/19  Yes [name], MD  omeprazole (PRILOSEC) 40 MG capsule Take 1 capsule (40 mg total) by mouth daily. 06/03/19  Yes [name], MD  potassium chloride (K-DUR) 10 MEQ tablet Take 1 tablet (10 mEq total) by mouth daily. 06/03/19  Yes [name], MD  sacubitril-valsartan (ENTRESTO) 24-26 MG Take 1 tablet by mouth 2 (two) times daily. 07/19/19  Yes Wieting, Richard, MD  spironolactone (ALDACTONE) 25 MG tablet Take 0.5 tablets (12.5 mg total) by mouth daily. 07/28/19 07/27/20 Yes [name], MD  traZODone (DESYREL) 50 MG tablet Take 0.5-1 tablets (25-50 mg total) by mouth daily. One hour before bedtime 06/03/19  Yes [name], MD    Review of Systems  Constitutional: Positive for fatigue. Negative for appetite change.  HENT: Negative for congestion, postnasal drip and sore throat.   Eyes: Negative.   Respiratory: Positive for cough and shortness of breath (with moderate exertion). Negative for chest tightness and wheezing.    Cardiovascular: Negative for chest pain, palpitations and leg swelling.  Gastrointestinal: Negative for abdominal distention and abdominal pain.  Endocrine: Negative.   Genitourinary: Negative.   Musculoskeletal: Negative for back pain and neck pain.  Skin: Negative.   Allergic/Immunologic: Negative.   Neurological: Positive for light-headedness (when changing positions too quickly). Negative for dizziness.  Hematological: Negative for adenopathy. Does not bruise/bleed easily.  Psychiatric/Behavioral: Negative for dysphoric mood and sleep disturbance (sleeping on 2 pillows). The patient is  not nervous/anxious.    Vitals:   09/04/19 1019  BP: 126/82  Pulse: 76  Resp: 16  SpO2: 100%  Weight: 159 lb 12.8 oz (72.5 kg)  Height: 5\' 4"  (1.626 m)   Wt Readings from Last 3 Encounters:  09/04/19 159 lb 12.8 oz (72.5 kg)  09/03/19 160 lb (72.6 kg)  08/26/19 162 lb (73.5 kg)   Lab Results  Component Value Date   CREATININE 1.10 (H) 08/26/2019   CREATININE 1.34 (H) 08/25/2019   CREATININE 1.13 (H) 08/24/2019     Physical Exam Vitals signs and nursing note reviewed.  Constitutional:      Appearance: She is well-developed.  HENT:     Head: Normocephalic and atraumatic.  Neck:     Musculoskeletal: Normal range of motion and neck supple.     Vascular: No JVD.  Cardiovascular:     Rate and Rhythm: Normal rate and regular rhythm.  Pulmonary:     Effort: Pulmonary effort is normal. No respiratory distress.     Breath sounds: No wheezing or rales.  Abdominal:     General: There is no distension.     Palpations: Abdomen is soft.  Musculoskeletal:     Right lower leg: She exhibits no tenderness. No edema.     Left lower leg: She exhibits no tenderness. No edema.  Skin:    General: Skin is warm and dry.  Neurological:     Mental Status: She is alert and oriented to person, place, and time.  Psychiatric:        Behavior: Behavior normal.    Assessment & Plan:  1: Chronic heart  failure with reduced ejection fraction- - NYHA class II - euvolemic today - weighing daily and she was reminded to call for an overnight weight gain of >2 pounds or a weekly weight gain of >5 pounds - weight down 5 pounds from last visit here ~3 months ago - not adding salt and has been reading food labels. Reminded to closely follow a 2000mg  sodium diet  - saw cardiology ([place]) 09/03/2019 & says that she's supposed to have cardiac MRI done at [facility] - consider titrating up entresto at future visits if BP remains good - BNP 08/24/2019 was 2357.0 - discussed paramedicine program with patient but she is not interested at this time  2: HTN- - BP looks good today - had telemedicine visit with PCP Derrel Nip) 09/03/2019 - BMP done 08/26/2019 reviewed and showed sodium 136, potassium 4.5, creatinine 1.10 and GFR 52  3: Tobacco use- - she says that she's been tobacco free since 06/19/2019 - continued complete cessation discussed for 3 minutes with her.  Patient did not bring her medications nor a list. Each medication was verbally reviewed with the patient and she was encouraged to bring the bottles to every visit to confirm accuracy of list.  Return in 1 month or sooner for any questions/problems before then.

## 2019-09-04 ENCOUNTER — Encounter: Payer: Self-pay | Admitting: Internal Medicine

## 2019-09-04 ENCOUNTER — Encounter: Payer: Self-pay | Admitting: Family

## 2019-09-04 ENCOUNTER — Ambulatory Visit: Payer: Medicare Other | Attending: Family | Admitting: Family

## 2019-09-04 ENCOUNTER — Telehealth: Payer: Self-pay

## 2019-09-04 ENCOUNTER — Ambulatory Visit (INDEPENDENT_AMBULATORY_CARE_PROVIDER_SITE_OTHER): Payer: Medicare Other | Admitting: Pharmacist

## 2019-09-04 VITALS — BP 126/82 | HR 76 | Resp 16 | Ht 64.0 in | Wt 159.8 lb

## 2019-09-04 DIAGNOSIS — I10 Essential (primary) hypertension: Secondary | ICD-10-CM

## 2019-09-04 DIAGNOSIS — Z720 Tobacco use: Secondary | ICD-10-CM

## 2019-09-04 DIAGNOSIS — I2510 Atherosclerotic heart disease of native coronary artery without angina pectoris: Secondary | ICD-10-CM | POA: Diagnosis not present

## 2019-09-04 DIAGNOSIS — K219 Gastro-esophageal reflux disease without esophagitis: Secondary | ICD-10-CM | POA: Diagnosis not present

## 2019-09-04 DIAGNOSIS — F329 Major depressive disorder, single episode, unspecified: Secondary | ICD-10-CM | POA: Insufficient documentation

## 2019-09-04 DIAGNOSIS — J441 Chronic obstructive pulmonary disease with (acute) exacerbation: Secondary | ICD-10-CM | POA: Diagnosis not present

## 2019-09-04 DIAGNOSIS — Z7982 Long term (current) use of aspirin: Secondary | ICD-10-CM | POA: Diagnosis not present

## 2019-09-04 DIAGNOSIS — I739 Peripheral vascular disease, unspecified: Secondary | ICD-10-CM | POA: Insufficient documentation

## 2019-09-04 DIAGNOSIS — R42 Dizziness and giddiness: Secondary | ICD-10-CM | POA: Diagnosis not present

## 2019-09-04 DIAGNOSIS — I130 Hypertensive heart and chronic kidney disease with heart failure and stage 1 through stage 4 chronic kidney disease, or unspecified chronic kidney disease: Secondary | ICD-10-CM | POA: Diagnosis not present

## 2019-09-04 DIAGNOSIS — R0602 Shortness of breath: Secondary | ICD-10-CM | POA: Diagnosis not present

## 2019-09-04 DIAGNOSIS — Z8249 Family history of ischemic heart disease and other diseases of the circulatory system: Secondary | ICD-10-CM | POA: Diagnosis not present

## 2019-09-04 DIAGNOSIS — I052 Rheumatic mitral stenosis with insufficiency: Secondary | ICD-10-CM | POA: Diagnosis not present

## 2019-09-04 DIAGNOSIS — Z79899 Other long term (current) drug therapy: Secondary | ICD-10-CM | POA: Insufficient documentation

## 2019-09-04 DIAGNOSIS — E785 Hyperlipidemia, unspecified: Secondary | ICD-10-CM | POA: Insufficient documentation

## 2019-09-04 DIAGNOSIS — J9611 Chronic respiratory failure with hypoxia: Secondary | ICD-10-CM

## 2019-09-04 DIAGNOSIS — Z951 Presence of aortocoronary bypass graft: Secondary | ICD-10-CM | POA: Diagnosis not present

## 2019-09-04 DIAGNOSIS — N189 Chronic kidney disease, unspecified: Secondary | ICD-10-CM | POA: Diagnosis not present

## 2019-09-04 DIAGNOSIS — I420 Dilated cardiomyopathy: Secondary | ICD-10-CM | POA: Insufficient documentation

## 2019-09-04 DIAGNOSIS — Z87891 Personal history of nicotine dependence: Secondary | ICD-10-CM | POA: Insufficient documentation

## 2019-09-04 DIAGNOSIS — I252 Old myocardial infarction: Secondary | ICD-10-CM | POA: Diagnosis not present

## 2019-09-04 DIAGNOSIS — I272 Pulmonary hypertension, unspecified: Secondary | ICD-10-CM | POA: Diagnosis not present

## 2019-09-04 DIAGNOSIS — I5043 Acute on chronic combined systolic (congestive) and diastolic (congestive) heart failure: Secondary | ICD-10-CM | POA: Diagnosis not present

## 2019-09-04 DIAGNOSIS — I5022 Chronic systolic (congestive) heart failure: Secondary | ICD-10-CM | POA: Diagnosis not present

## 2019-09-04 DIAGNOSIS — Z95810 Presence of automatic (implantable) cardiac defibrillator: Secondary | ICD-10-CM | POA: Insufficient documentation

## 2019-09-04 MED ORDER — TRELEGY ELLIPTA 100-62.5-25 MCG/INH IN AEPB: 1.0000 | INHALATION_SPRAY | Freq: Every day | RESPIRATORY_TRACT | 2 refills | Status: DC

## 2019-09-04 MED ORDER — ALBUTEROL SULFATE HFA 108 (90 BASE) MCG/ACT IN AERS: INHALATION_SPRAY | RESPIRATORY_TRACT | 2 refills | Status: DC

## 2019-09-04 NOTE — Assessment & Plan Note (Signed)
Recurrent, secondary to flash pulmonary edema..resolved and wt stable with tid lasix dosing.  awaiting cardiology's opinion on the benefit of mitral valve replacement  In this patient who has had 4 admissions since July for same.

## 2019-09-04 NOTE — Assessment & Plan Note (Signed)
Patient is stable post discharge and has no new issues or questions about discharge plans at the visit today for hospital follow up. All labs , imaging studies and progress notes from admission were reviewed with patient today   

## 2019-09-04 NOTE — Telephone Encounter (Signed)
-----   Message from Tyler Pita, MD sent at 09/04/2019  4:44 PM EDT ----- Regarding: FW: OK to send Rxs to [facility] ----- Message ----- From: De Hollingshead, [facility] Sent: 09/04/2019   4:41 PM EDT To: Tyler Pita, MD Subject: RE:                                            SO SORRY.   Ms. Mcgloin.   Thanks! ----- Message ----- From: Tyler Pita, MD Sent: 09/04/2019   4:33 PM EDT To: De Hollingshead, RPH Subject: RE:                                            Which patient? ----- Message ----- From: De Hollingshead, [facility] Sent: 09/04/2019   4:27 PM EDT To: Tyler Pita, MD  Dr. Patsey Berthold,  I've been working with this patient with Dr. Derrel Nip.   She has Medicare Extra Help - so her copays are $3.60 for generics and $8.95 for brand...for either 30 day or 90 day supply.   Would you mind resending her Trelegy and albuterol prescriptions to [facility] for 90 day supplies? She said she's almost due for a Trelegy refill. If we can help her save $20, that's something!   Thanks! [name] Darnelle Maffucci, PharmD, [place] Pharmacist [facility] [address] [phone]

## 2019-09-04 NOTE — Patient Instructions (Addendum)
Visit Information  Goals Addressed            This Visit's Progress     Patient Stated   . "I want to stay out of the hospital" (pt-stated)       Current Barriers:  . Polypharmacy; complex patient with multiple comorbidities including HFrEF, COPD, CAD (hx NSTEMI, s/p CABG x3 and mitral valve repair in 2007), continued severe mitral valve regurgitation; barrett's esophagus, depression . Self-manages medications by filling a BID pill box. Has Medicare Extra Help- all brand medications $8.95 and generic $3.60 for 30 or 90 day supply o COPD: Tx Trelegy daily + albuterol HFA PRN; follows w/ Dr. Patsey Berthold o CHF: HFrEF/CAD/mitral valve; managed on Entresto 49/51 BID, spironolactone 12.5 mg BID, carvedilol 3.125 mg BID, furosemide 60 mg daily; follows w/ Dr. Saralyn Pilar and HF clinic. Reports that after cardiology appointment yesterday, it was decided that they would schedule a cardiac MRI. Awaiting this o Insomnia: notes taking trazodone 50 mg QPM, alprazolam 0.25 mg QPM, and diphenhydramine 25 mg QPM for sleep.  o Hx tobacco abuse: patient has been TOBACCO FREE since 06/19/2019.   Pharmacist Clinical Goal(s):  [name] Over the next 90 days, patient will work with PharmD and provider towards optimized medication management  Interventions: . Comprehensive medication review performed; medication list updated in electronic medical record . Discussed with patient that 90 day supplies have same copay as 30. Messaged Jackelyn Hoehn, Dr. Patsey Berthold, and Dr. Derrel Nip requesting that refills be sent for 90 day supplies to save money for the patient . Discussed patient's stable breathing and fluid status. She is hopeful about maintaining this.  . Praised patient for continued tobacco cessation. Will be 90 days free next month.  . Will collaborate w/ Reginia Naas, RN CM, regarding f/u with patient regaridng cardiac MRI. [name] [place]. Requested that all prescriptions be filled for 90 day supplies moving  forward. Spoke with pharmacist, Autumn; she is making a note in the patient's profile that 90 days are preferred.  Patient Self Care Activities:  . Patient will take medications as prescribed . Patient will attend follow up appointments as scheduled.   Please see past updates related to this goal by clicking on the "Past Updates" button in the selected goal         The patient verbalized understanding of instructions provided today and declined a print copy of patient instruction materials.   Plan:  - Will outreach patient in ~2-3 weeks for continued medication management support  [name] Darnelle Maffucci, PharmD, [place] Pharmacist [place] [phone]

## 2019-09-04 NOTE — Telephone Encounter (Signed)
Rx for albuterol and Trelegy has been sent to preferred pharmacy.

## 2019-09-04 NOTE — Chronic Care Management (AMB) (Signed)
Chronic Care Management   Follow Up Note   09/04/2019 Name: Ashley [name] MRN: [id number] DOB: [birth year]  Referred by: [name], MD Reason for referral : Chronic Care Management (Medication Management)   Ashley [name] is a 68 y.o. year old female who is a primary care patient of [name], [name], MD. The CCM team was consulted for assistance with chronic disease management and care coordination needs.    Contacted patient for medication management review.  Review of patient status, including review of consultants reports, relevant laboratory and other test results, and collaboration with appropriate care team members and the patient's provider was performed as part of comprehensive patient evaluation and provision of chronic care management services.    SDOH (Social Determinants of Health) screening performed today: Financial Strain . See Care Plan for related entries.   Advanced Directives Status: N See Care Plan and [employer] application for related entries.  Outpatient Encounter Medications as of 09/04/2019  Medication Sig Note  . albuterol (VENTOLIN HFA) 108 (90 Base) MCG/ACT inhaler INHALE 2 PUFFS BY MOUTH INTO THE LUNGS EVERY 6 HOURS AS NEEDED FOR WHEEZING   . ALPRAZolam (XANAX) 0.5 MG tablet Take 0.5-1 tablets (0.25-0.5 mg total) by mouth at bedtime. TAKE ONE TABLET BY MOUTH AT BEDTIME AS NEEDED FOR ANXIETY 09/04/2019: 1/2 tab QHS  . aspirin 81 MG tablet Take 81 mg by mouth daily.   [name] atorvastatin (LIPITOR) 40 MG tablet TAKE ONE TABLET BY MOUTH ONCE DAILY   . carvedilol (COREG) 6.25 MG tablet Take 1 tablet (6.25 mg total) by mouth 2 (two) times daily with a meal.   . clopidogrel (PLAVIX) 75 MG tablet Take 1 tablet (75 mg total) by mouth daily.   . cyanocobalamin (,VITAMIN B-12,) 1000 MCG/ML injection Inject 1 mL weekly into the muscle monthly   . diphenhydrAMINE (DIPHENHIST) 25 mg capsule Take 25 mg by mouth at bedtime as needed for allergies or sleep.  09/04/2019:  Taking QPM  . escitalopram (LEXAPRO) 10 MG tablet Take 1 tablet (10 mg total) by mouth daily.   . ferrous sulfate 325 (65 FE) MG EC tablet Take 325 mg by mouth daily with breakfast.   . Fluticasone-Umeclidin-Vilant (TRELEGY ELLIPTA) 100-62.5-25 MCG/INH AEPB Inhale 1 puff into the lungs daily.   . furosemide (LASIX) 40 MG tablet TAKE ONE TABLET BY MOUTH TWICE DAILY (Patient taking differently: 40 mg 3 (three) times daily. )   . omeprazole (PRILOSEC) 40 MG capsule Take 1 capsule (40 mg total) by mouth daily.   . potassium chloride (K-DUR) 10 MEQ tablet Take 1 tablet (10 mEq total) by mouth daily.   . sacubitril-valsartan (ENTRESTO) 24-26 MG Take 1 tablet by mouth 2 (two) times daily.   [name] spironolactone (ALDACTONE) 25 MG tablet Take 0.5 tablets (12.5 mg total) by mouth daily.   . traZODone (DESYREL) 50 MG tablet Take 0.5-1 tablets (25-50 mg total) by mouth daily. One hour before bedtime 09/04/2019: Usually 1/2 tab, occasionally 1 tab  . acetaminophen (TYLENOL) 500 MG tablet Maximum 6 tablets a day. (Patient not taking: Reported on 09/04/2019)   . ipratropium-albuterol (DUONEB) 0.5-2.5 (3) MG/3ML SOLN Take 3 mLs by nebulization every 6 (six) hours as needed. (Patient not taking: Reported on 09/04/2019)   . nitroGLYCERIN (NITROSTAT) 0.4 MG SL tablet Place 1 tablet (0.4 mg total) under the tongue every 5 (five) minutes as needed for chest pain. (Patient not taking: Reported on 09/04/2019)    No facility-administered encounter medications on file as of 09/04/2019.  Goals Addressed            This Visit's Progress     Patient Stated   . "I want to stay out of the hospital" (pt-stated)       Current Barriers:  . Polypharmacy; complex patient with multiple comorbidities including HFrEF, COPD, CAD (hx NSTEMI, s/p CABG x3 and mitral valve repair in 2007), continued severe mitral valve regurgitation; barrett's esophagus, depression . Self-manages medications by filling a BID pill box. Has Medicare  Extra Help- all brand medications $8.95 and generic $3.60 for 30 or 90 day supply o COPD: Tx Trelegy daily + albuterol HFA PRN; follows w/ Dr. Patsey Berthold o CHF: HFrEF/CAD/mitral valve; managed on Entresto 49/51 BID, spironolactone 12.5 mg BID, carvedilol 3.125 mg BID, furosemide 60 mg daily; follows w/ Dr. Saralyn Pilar and HF clinic. Reports that after cardiology appointment yesterday, it was decided that they would schedule a cardiac MRI. Awaiting this o Insomnia: notes taking trazodone 50 mg QPM, alprazolam 0.25 mg QPM, and diphenhydramine 25 mg QPM for sleep.  o Hx tobacco abuse: patient has been TOBACCO FREE since 06/19/2019.   Pharmacist Clinical Goal(s):  [name] Over the next 90 days, patient will work with PharmD and provider towards optimized medication management  Interventions: . Comprehensive medication review performed; medication list updated in electronic medical record . Discussed with patient that 90 day supplies have same copay as 30. Messaged Jackelyn Hoehn, Dr. Patsey Berthold, and Dr. Derrel Nip requesting that refills be sent for 90 day supplies to save money for the patient . Discussed patient's stable breathing and fluid status. She is hopeful about maintaining this.  . Praised patient for continued tobacco cessation. Will be 90 days free next month.  . Will collaborate w/ Reginia Naas, RN CM, regarding f/u with patient regaridng cardiac MRI. [name] [place]. Requested that all prescriptions be filled for 90 day supplies moving forward. Spoke with pharmacist, Autumn; she is making a note in the patient's profile that 90 days are preferred.  Patient Self Care Activities:  . Patient will take medications as prescribed . Patient will attend follow up appointments as scheduled.   Please see past updates related to this goal by clicking on the "Past Updates" button in the selected goal          Plan:  - Will outreach patient in ~2-3 weeks for continued medication management support   [name] Darnelle Maffucci, PharmD, [place] Pharmacist [facility] [phone]

## 2019-09-05 ENCOUNTER — Other Ambulatory Visit: Payer: Self-pay | Admitting: Internal Medicine

## 2019-09-05 ENCOUNTER — Other Ambulatory Visit: Payer: Self-pay | Admitting: Family

## 2019-09-05 ENCOUNTER — Telehealth: Payer: Self-pay | Admitting: Internal Medicine

## 2019-09-05 ENCOUNTER — Other Ambulatory Visit: Payer: Self-pay

## 2019-09-05 MED ORDER — CARVEDILOL 6.25 MG PO TABS: 6.2500 mg | ORAL_TABLET | Freq: Two times a day (BID) | ORAL | 3 refills | Status: DC

## 2019-09-05 MED ORDER — ENTRESTO 24-26 MG PO TABS: 1.0000 | ORAL_TABLET | Freq: Two times a day (BID) | ORAL | 3 refills | Status: DC

## 2019-09-05 MED ORDER — ALPRAZOLAM 0.5 MG PO TABS: 0.2500 mg | ORAL_TABLET | Freq: Every day | ORAL | 3 refills | Status: DC

## 2019-09-05 MED ORDER — CYANOCOBALAMIN 1000 MCG/ML IJ SOLN: INTRAMUSCULAR | 0 refills | Status: DC

## 2019-09-05 MED ORDER — TRAZODONE HCL 50 MG PO TABS: 25.0000 mg | ORAL_TABLET | Freq: Every day | ORAL | 3 refills | Status: DC

## 2019-09-05 MED ORDER — FUROSEMIDE 40 MG PO TABS: 40.0000 mg | ORAL_TABLET | Freq: Three times a day (TID) | ORAL | 3 refills | Status: DC

## 2019-09-05 MED ORDER — ESCITALOPRAM OXALATE 10 MG PO TABS: 10.0000 mg | ORAL_TABLET | Freq: Every day | ORAL | 1 refills | Status: DC

## 2019-09-05 MED ORDER — CLOPIDOGREL BISULFATE 75 MG PO TABS: 75.0000 mg | ORAL_TABLET | Freq: Every day | ORAL | 3 refills | Status: DC

## 2019-09-05 MED ORDER — OMEPRAZOLE 40 MG PO CPDR: 40.0000 mg | DELAYED_RELEASE_CAPSULE | Freq: Every day | ORAL | 1 refills | Status: DC

## 2019-09-05 MED ORDER — SPIRONOLACTONE 25 MG PO TABS: 12.5000 mg | ORAL_TABLET | Freq: Every day | ORAL | 3 refills | Status: DC

## 2019-09-05 MED ORDER — POTASSIUM CHLORIDE ER 10 MEQ PO TBCR: 10.0000 meq | EXTENDED_RELEASE_TABLET | Freq: Every day | ORAL | 1 refills | Status: DC

## 2019-09-05 NOTE — Telephone Encounter (Signed)
I have called patient to clarify & she stated that she did B-12 injections weekly, but now has been moved to monthly. I have corrected & resent to [facility].

## 2019-09-05 NOTE — Telephone Encounter (Signed)
Copied from [place] [id number]. Topic: General - Other >> Sep 05, 2019 10:54 AM Keene Breath wrote: Reason for CRM: Called to get clarification on the patient's medication, cyanocobalamin (,VITAMIN B-12,) 1000 MCG/ML injection,  Please call to discuss at [phone]

## 2019-09-08 ENCOUNTER — Encounter: Payer: Self-pay | Admitting: *Deleted

## 2019-09-08 ENCOUNTER — Other Ambulatory Visit: Payer: Self-pay | Admitting: *Deleted

## 2019-09-08 NOTE — Patient Outreach (Signed)
[facility]) [name] Telephone Outreach PCP office completes Transition of Care follow up post-hospital discharge Post-hospital discharge day # 12  09/08/2019  Ashley [name] 30-Jan-1951 [id number]  Successful telephone outreach toDeborah Loyal [name], 68 y/o femalereferred to [place] by [facility] Liaison RN CM after recent hospitalization July 21-25, 2020 for acute on chronic CHF exacerbation.Patientwas discharged from hospital to home/ self-care without home health services in place. Patient has history including, but not limited to, combined CHF; CAD with previous MI/ CABG, and ICD; HTN/ HLD; GERD; COPD; history of tobacco use- patient has quit smoking since June 19, 2019.  Unfortunately, patient experiencedmultiplerecenthospital readmissions: 1)August 13-15, 2020 for acute respiratory failure with hypoxia/18 days after her hospital discharge, ; patient was again discharged home without home health services in place. 2) September 9-12, 2020 for acute pulmonary edema; respiratory failure with hypoxia; and CHF exacerbation/ 25 days after previous hospital discharge; was again discharged home without home health services in place, as patient continually refuses need for home health. 3) ED visit 08/02/2019 for chest pain; patient was also diagnosed with ETOH intoxication and discharged homeafter being ruled out for ACS/ MI 4) October 6-9,2020,24 days after her last hospital discharge, again for COPD/ CHF exacerbation. 5) October 18-21, 2020, 9 days after last hospital discharge; Acute Respiratory failure with flash pulmonary edema, secondary to CHF exacerbation/ severe MS/ MR: patient had cardiac catherization during this hospital admission and was determined to have significant 3-vessel CAD and EF of 20-25%  HIPAA/ identity verified with patient; pleasant 20 minute phone call. Today, patient again reports "doing good today,"  post-most recent hospital discharge; she denies pain and new/ recent falls and sounds to be in no apparent distress throughout phone call today.  Patient further reports: -- has all medications and is taking as prescribed; denies concerns around medications; still taking "extra" dose of Lasix (TID instead of BID); continues working with [place] Pharmacist [name] on medication management.  Reports one recent episode of acute SOB at home- last week, which she reports was resolved with use of nebulizer; reports has not happened again. -- has not yet obtained flu shot for 2020/ 2021 flu season- verbalizes plans to obtain at time of upcoming scheduled PCP office visit 09/23/2019; discussed with patient options to obtain prior to scheduled appointment, should be interested  -- discussed signs/ symptoms to be aware of indicating dehydration, since patient is taking additional diuretic- discussed action plan to contact care providers immediately should she experience any signs/ symptoms dehydration -- has continued not smoking- positive reinforcement provided -- verbalizes accurate report of upcoming provider appointments; states plans to attend all; states she will continue to drive self to all appointments -- states attended all scheduled provider office visits last week; denies changes to medications/ overall plan of care; verbalizes accurate understanding that cardiac MRI at [place] is pending post- cardiology provider office visit; has not yet heard from [facility] to schedule; advised patient to contact cardiology provider to facilitate prompt scheduling if she has not heard from [facility] to schedule by mid-week; offered assistance in facilitating this process to schedule MRI appointment as indicated -- continues able to accurately verbalize weight gain guidelines in setting of CHF, along with signs/ symptoms yellow CHF zone and corresponding action plan; reports daily weights at home in ranges between "159-160 lbs" WITH  taking "extra" dose of diuretic (from BID to TID)-- weight this morning: 159 lbs, without swelling/ SOB or other concerning signs/ symptoms --  continues monitoring SaO2 levels and does so while I am on phone: reports all values "95%" on room air; denies issues with breathing today and verbalizes accurate/ appropriate action plan for episodes of SOB, as noted above  Patient denies further issues, concerns, or problems today. I confirmed that patient hasmy direct phone number, the main [facility] CM office phone number, and the [facility] CM 24-hour nurse advice phone number should issues arise prior to next scheduled [place] outreach. Encouraged patient to contact me directly if needs, questions, issues, or concerns arise prior to next scheduled outreach; patient agreed to do so.  Plan:  Patient will take medications as prescribed and will attend all scheduled provider appointments  Patient will promptly notify care providers for any new concerns/ issues/ problems that arise  Patient will continue monitoring/ recording daily weightsand following action plan for CHF zones  Patient will continuenotsmoking  [place] outreach to continue with scheduled phone callin 3 weeks post-upcoming scheduled provider appointments, or sooner of indicated  [facility] CM Care Plan Problem One     Most Recent Value  Care Plan Problem One  High risk for hospital readmission related to/ as evidenced by multiple recent hospitalizations for Acute Respiratory Failure/ CHF exacerbation with most recent hospital discharge on 08/27/2019   Role Documenting the Problem One  Care Management Coordinator  Care Plan for Problem One  Active  [facility] Long Term Goal   Over the next 31 days, patient will not experience unplanned hospital readmission, as evidenced by patient reporting and review of EMR during [facility] RN CCM outreach  [facility] Long Term Goal Start Date  08/28/19  Interventions for Problem One Long Term Goal  Discussed  with patient her current clinical condition and confirmed that she has no current clinical concerns,  reviewed recent and upcoming provider appointments with patient and confirmed that she ocntinues to provide self-transportation and plans to attend all scheduled provider appointments,  confirmed that patient continues taking "extra" dose of Lasix (TID- instead of BID),  discussed signs/ symptoms dehydration to immediately report to care providers should these signs/ symptoms occur    [facility] CM Care Plan Problem Two     Most Recent Value  Care Plan Problem Two  Need for ongoing reinforcement of self-health management strategies for chronic disease state of CHF/ COPD, as evidenced by patient reporting and recent hospitalization for CHF exacerbation  Role Documenting the Problem Two  Care Management Coordinator  Care Plan for Problem Two  Active  Interventions for Problem Two Long Term Goal   Reviewed patient's recent weights at home and confirmed that she remains able to verbalize signs/ symptoms CHF yellow zone along with corresponding action plan, with minimal prompting  [facility] Long Term Goal  Over the next 23 days, patient will continue to monitor and record daily weights at home and will follow action plan for yellow CHF zone, as evidenced by patient reporting and review of same during [place] CM outreach [Goal extended today]  [facility] Long Term Goal Start Date  08/18/19  [facility] CM Short Term Goal #1   Over the next 21 days, patient will schedule/ attend follow up appointment with cardiologist, as evidenced by patient reporting and review of EMR during [facility] RN CM outreach  [facility] CM Short Term Goal #1 Start Date  09/01/19  [facility] CM Short Term Goal #1 Met Date   09/08/19 [Goal Met]  Interventions for Short Term Goal #2   Confirmed that patient attended recent cardiology  appointment,  coached patient around follow up necessary to ensure that cardiac MRI at [facility] is scheduled promptly,  offered patient assistance  with facilitation of scheduling of [facility] cardiac MRI if she is unable to do or has challenges in scheduling     [name], RN, BSN, [place] Care Management  [phone]

## 2019-09-18 ENCOUNTER — Other Ambulatory Visit: Payer: Self-pay | Admitting: Pulmonary Disease

## 2019-09-18 NOTE — Telephone Encounter (Signed)
Created in error

## 2019-09-19 ENCOUNTER — Other Ambulatory Visit: Payer: Self-pay

## 2019-09-22 ENCOUNTER — Inpatient Hospital Stay: Payer: Medicare Other

## 2019-09-22 ENCOUNTER — Emergency Department: Payer: Medicare Other

## 2019-09-22 ENCOUNTER — Other Ambulatory Visit: Payer: Self-pay

## 2019-09-22 ENCOUNTER — Encounter: Payer: Self-pay | Admitting: Emergency Medicine

## 2019-09-22 ENCOUNTER — Other Ambulatory Visit: Payer: Self-pay | Admitting: *Deleted

## 2019-09-22 ENCOUNTER — Inpatient Hospital Stay
Admission: EM | Admit: 2019-09-22 | Discharge: 2019-09-24 | DRG: 291 | Disposition: A | Discharge status: Home / Self Care | Payer: Medicare Other | Attending: Internal Medicine | Admitting: Internal Medicine

## 2019-09-22 DIAGNOSIS — I252 Old myocardial infarction: Secondary | ICD-10-CM

## 2019-09-22 DIAGNOSIS — J9811 Atelectasis: Secondary | ICD-10-CM | POA: Diagnosis not present

## 2019-09-22 DIAGNOSIS — R062 Wheezing: Secondary | ICD-10-CM | POA: Diagnosis not present

## 2019-09-22 DIAGNOSIS — I10 Essential (primary) hypertension: Secondary | ICD-10-CM | POA: Diagnosis present

## 2019-09-22 DIAGNOSIS — Z950 Presence of cardiac pacemaker: Secondary | ICD-10-CM | POA: Insufficient documentation

## 2019-09-22 DIAGNOSIS — Z85038 Personal history of other malignant neoplasm of large intestine: Secondary | ICD-10-CM | POA: Diagnosis not present

## 2019-09-22 DIAGNOSIS — Z841 Family history of disorders of kidney and ureter: Secondary | ICD-10-CM

## 2019-09-22 DIAGNOSIS — I052 Rheumatic mitral stenosis with insufficiency: Secondary | ICD-10-CM | POA: Diagnosis not present

## 2019-09-22 DIAGNOSIS — I25810 Atherosclerosis of coronary artery bypass graft(s) without angina pectoris: Secondary | ICD-10-CM | POA: Diagnosis present

## 2019-09-22 DIAGNOSIS — Z7902 Long term (current) use of antithrombotics/antiplatelets: Secondary | ICD-10-CM | POA: Diagnosis not present

## 2019-09-22 DIAGNOSIS — R7303 Prediabetes: Secondary | ICD-10-CM | POA: Diagnosis not present

## 2019-09-22 DIAGNOSIS — Z7951 Long term (current) use of inhaled steroids: Secondary | ICD-10-CM

## 2019-09-22 DIAGNOSIS — I340 Nonrheumatic mitral (valve) insufficiency: Secondary | ICD-10-CM | POA: Diagnosis present

## 2019-09-22 DIAGNOSIS — Z95810 Presence of automatic (implantable) cardiac defibrillator: Secondary | ICD-10-CM

## 2019-09-22 DIAGNOSIS — I509 Heart failure, unspecified: Secondary | ICD-10-CM | POA: Diagnosis not present

## 2019-09-22 DIAGNOSIS — F32A Depression, unspecified: Secondary | ICD-10-CM

## 2019-09-22 DIAGNOSIS — N179 Acute kidney failure, unspecified: Secondary | ICD-10-CM | POA: Diagnosis present

## 2019-09-22 DIAGNOSIS — E1151 Type 2 diabetes mellitus with diabetic peripheral angiopathy without gangrene: Secondary | ICD-10-CM | POA: Diagnosis present

## 2019-09-22 DIAGNOSIS — E7800 Pure hypercholesterolemia, unspecified: Secondary | ICD-10-CM | POA: Diagnosis present

## 2019-09-22 DIAGNOSIS — Z833 Family history of diabetes mellitus: Secondary | ICD-10-CM

## 2019-09-22 DIAGNOSIS — F329 Major depressive disorder, single episode, unspecified: Secondary | ICD-10-CM | POA: Diagnosis present

## 2019-09-22 DIAGNOSIS — T380X5A Adverse effect of glucocorticoids and synthetic analogues, initial encounter: Secondary | ICD-10-CM | POA: Diagnosis not present

## 2019-09-22 DIAGNOSIS — I161 Hypertensive emergency: Secondary | ICD-10-CM | POA: Diagnosis present

## 2019-09-22 DIAGNOSIS — Z79899 Other long term (current) drug therapy: Secondary | ICD-10-CM

## 2019-09-22 DIAGNOSIS — N189 Chronic kidney disease, unspecified: Secondary | ICD-10-CM | POA: Insufficient documentation

## 2019-09-22 DIAGNOSIS — D126 Benign neoplasm of colon, unspecified: Secondary | ICD-10-CM | POA: Diagnosis present

## 2019-09-22 DIAGNOSIS — I110 Hypertensive heart disease with heart failure: Secondary | ICD-10-CM | POA: Diagnosis not present

## 2019-09-22 DIAGNOSIS — D649 Anemia, unspecified: Secondary | ICD-10-CM | POA: Diagnosis present

## 2019-09-22 DIAGNOSIS — I272 Pulmonary hypertension, unspecified: Secondary | ICD-10-CM | POA: Diagnosis present

## 2019-09-22 DIAGNOSIS — R9431 Abnormal electrocardiogram [ECG] [EKG]: Secondary | ICD-10-CM | POA: Diagnosis not present

## 2019-09-22 DIAGNOSIS — Z23 Encounter for immunization: Secondary | ICD-10-CM

## 2019-09-22 DIAGNOSIS — I420 Dilated cardiomyopathy: Secondary | ICD-10-CM | POA: Diagnosis present

## 2019-09-22 DIAGNOSIS — R918 Other nonspecific abnormal finding of lung field: Secondary | ICD-10-CM | POA: Diagnosis not present

## 2019-09-22 DIAGNOSIS — E785 Hyperlipidemia, unspecified: Secondary | ICD-10-CM | POA: Diagnosis present

## 2019-09-22 DIAGNOSIS — Z8249 Family history of ischemic heart disease and other diseases of the circulatory system: Secondary | ICD-10-CM

## 2019-09-22 DIAGNOSIS — Z8349 Family history of other endocrine, nutritional and metabolic diseases: Secondary | ICD-10-CM

## 2019-09-22 DIAGNOSIS — I2510 Atherosclerotic heart disease of native coronary artery without angina pectoris: Secondary | ICD-10-CM | POA: Diagnosis present

## 2019-09-22 DIAGNOSIS — R0602 Shortness of breath: Secondary | ICD-10-CM | POA: Diagnosis not present

## 2019-09-22 DIAGNOSIS — K219 Gastro-esophageal reflux disease without esophagitis: Secondary | ICD-10-CM | POA: Diagnosis present

## 2019-09-22 DIAGNOSIS — I25700 Atherosclerosis of coronary artery bypass graft(s), unspecified, with unstable angina pectoris: Secondary | ICD-10-CM | POA: Diagnosis not present

## 2019-09-22 DIAGNOSIS — J439 Emphysema, unspecified: Secondary | ICD-10-CM | POA: Diagnosis present

## 2019-09-22 DIAGNOSIS — J9600 Acute respiratory failure, unspecified whether with hypoxia or hypercapnia: Secondary | ICD-10-CM | POA: Diagnosis present

## 2019-09-22 DIAGNOSIS — I160 Hypertensive urgency: Secondary | ICD-10-CM

## 2019-09-22 DIAGNOSIS — Z20828 Contact with and (suspected) exposure to other viral communicable diseases: Secondary | ICD-10-CM | POA: Diagnosis present

## 2019-09-22 DIAGNOSIS — I130 Hypertensive heart and chronic kidney disease with heart failure and stage 1 through stage 4 chronic kidney disease, or unspecified chronic kidney disease: Secondary | ICD-10-CM | POA: Diagnosis present

## 2019-09-22 DIAGNOSIS — N1831 Chronic kidney disease, stage 3a: Secondary | ICD-10-CM | POA: Diagnosis present

## 2019-09-22 DIAGNOSIS — I248 Other forms of acute ischemic heart disease: Secondary | ICD-10-CM | POA: Diagnosis present

## 2019-09-22 DIAGNOSIS — Z7982 Long term (current) use of aspirin: Secondary | ICD-10-CM | POA: Diagnosis not present

## 2019-09-22 DIAGNOSIS — D631 Anemia in chronic kidney disease: Secondary | ICD-10-CM | POA: Diagnosis present

## 2019-09-22 DIAGNOSIS — R0689 Other abnormalities of breathing: Secondary | ICD-10-CM | POA: Diagnosis not present

## 2019-09-22 DIAGNOSIS — Z209 Contact with and (suspected) exposure to unspecified communicable disease: Secondary | ICD-10-CM | POA: Diagnosis not present

## 2019-09-22 DIAGNOSIS — R739 Hyperglycemia, unspecified: Secondary | ICD-10-CM | POA: Insufficient documentation

## 2019-09-22 DIAGNOSIS — Z8261 Family history of arthritis: Secondary | ICD-10-CM | POA: Diagnosis not present

## 2019-09-22 DIAGNOSIS — J811 Chronic pulmonary edema: Secondary | ICD-10-CM | POA: Diagnosis not present

## 2019-09-22 DIAGNOSIS — E1165 Type 2 diabetes mellitus with hyperglycemia: Secondary | ICD-10-CM | POA: Diagnosis not present

## 2019-09-22 DIAGNOSIS — E872 Acidosis: Secondary | ICD-10-CM | POA: Diagnosis present

## 2019-09-22 DIAGNOSIS — J9601 Acute respiratory failure with hypoxia: Secondary | ICD-10-CM | POA: Diagnosis present

## 2019-09-22 DIAGNOSIS — I5023 Acute on chronic systolic (congestive) heart failure: Secondary | ICD-10-CM | POA: Diagnosis present

## 2019-09-22 DIAGNOSIS — Z87891 Personal history of nicotine dependence: Secondary | ICD-10-CM

## 2019-09-22 DIAGNOSIS — D62 Acute posthemorrhagic anemia: Secondary | ICD-10-CM | POA: Diagnosis present

## 2019-09-22 DIAGNOSIS — J9691 Respiratory failure, unspecified with hypoxia: Secondary | ICD-10-CM | POA: Diagnosis present

## 2019-09-22 DIAGNOSIS — J9621 Acute and chronic respiratory failure with hypoxia: Secondary | ICD-10-CM | POA: Diagnosis present

## 2019-09-22 DIAGNOSIS — R069 Unspecified abnormalities of breathing: Secondary | ICD-10-CM | POA: Diagnosis not present

## 2019-09-22 DIAGNOSIS — E1122 Type 2 diabetes mellitus with diabetic chronic kidney disease: Secondary | ICD-10-CM | POA: Diagnosis present

## 2019-09-22 LAB — MAGNESIUM: Magnesium: 2.3 mg/dL (ref 1.7–2.4)

## 2019-09-22 LAB — BLOOD GAS, ARTERIAL
Acid-base deficit: 3.9 mmol/L — ABNORMAL HIGH (ref 0.0–2.0)
Allens test (pass/fail): POSITIVE — AB
Bicarbonate: 19.5 mmol/L — ABNORMAL LOW (ref 20.0–28.0)
FIO2: 0.28
O2 Saturation: 96.1 %
Patient temperature: 37
pCO2 arterial: 30 mmHg — ABNORMAL LOW (ref 32.0–48.0)
pH, Arterial: 7.42 (ref 7.350–7.450)
pO2, Arterial: 81 mmHg — ABNORMAL LOW (ref 83.0–108.0)

## 2019-09-22 LAB — BASIC METABOLIC PANEL
Anion gap: 14 (ref 5–15)
BUN: 31 mg/dL — ABNORMAL HIGH (ref 8–23)
CO2: 21 mmol/L — ABNORMAL LOW (ref 22–32)
Calcium: 9.2 mg/dL (ref 8.9–10.3)
Chloride: 101 mmol/L (ref 98–111)
Creatinine, Ser: 1.44 mg/dL — ABNORMAL HIGH (ref 0.44–1.00)
GFR calc Af Amer: 44 mL/min — ABNORMAL LOW (ref >60)
GFR calc non Af Amer: 38 mL/min — ABNORMAL LOW (ref >60)
Glucose, Bld: 413 mg/dL — ABNORMAL HIGH (ref 70–99)
Potassium: 5.1 mmol/L (ref 3.5–5.1)
Sodium: 136 mmol/L (ref 135–145)

## 2019-09-22 LAB — CBC WITH DIFFERENTIAL/PLATELET
Abs Immature Granulocytes: 0.1 10*3/uL — ABNORMAL HIGH (ref 0.00–0.07)
Basophils Absolute: 0.2 10*3/uL — ABNORMAL HIGH (ref 0.0–0.1)
Basophils Relative: 2 %
Eosinophils Absolute: 0.3 10*3/uL (ref 0.0–0.5)
Eosinophils Relative: 3 %
HCT: 44.4 % (ref 36.0–46.0)
Hemoglobin: 14.9 g/dL (ref 12.0–15.0)
Immature Granulocytes: 1 %
Lymphocytes Relative: 21 %
Lymphs Abs: 2.5 10*3/uL (ref 0.7–4.0)
MCH: 31.6 pg (ref 26.0–34.0)
MCHC: 33.6 g/dL (ref 30.0–36.0)
MCV: 94.1 fL (ref 80.0–100.0)
Monocytes Absolute: 1 10*3/uL (ref 0.1–1.0)
Monocytes Relative: 8 %
Neutro Abs: 8 10*3/uL — ABNORMAL HIGH (ref 1.7–7.7)
Neutrophils Relative %: 65 %
Platelets: 342 10*3/uL (ref 150–400)
RBC: 4.72 MIL/uL (ref 3.87–5.11)
RDW: 15.2 % (ref 11.5–15.5)
WBC: 12.1 10*3/uL — ABNORMAL HIGH (ref 4.0–10.5)
nRBC: 0 % (ref 0.0–0.2)

## 2019-09-22 LAB — MRSA PCR SCREENING: MRSA by PCR: NEGATIVE

## 2019-09-22 LAB — BLOOD GAS, VENOUS
Acid-base deficit: 3.7 mmol/L — ABNORMAL HIGH (ref 0.0–2.0)
Bicarbonate: 22.7 mmol/L (ref 20.0–28.0)
O2 Saturation: 94.7 %
Patient temperature: 37
pCO2, Ven: 45 mmHg (ref 44.0–60.0)
pH, Ven: 7.31 (ref 7.250–7.430)
pO2, Ven: 81 mmHg — ABNORMAL HIGH (ref 32.0–45.0)

## 2019-09-22 LAB — TYPE AND SCREEN
ABO/RH(D): B POS
Antibody Screen: NEGATIVE

## 2019-09-22 LAB — TROPONIN I (HIGH SENSITIVITY)
Troponin I (High Sensitivity): 19 ng/L — ABNORMAL HIGH (ref <18)
Troponin I (High Sensitivity): 19 ng/L — ABNORMAL HIGH (ref <18)
Troponin I (High Sensitivity): 28 ng/L — ABNORMAL HIGH (ref <18)
Troponin I (High Sensitivity): 25 ng/L — ABNORMAL HIGH (ref <18)

## 2019-09-22 LAB — GLUCOSE, CAPILLARY
Glucose-Capillary: 179 mg/dL — ABNORMAL HIGH (ref 70–99)
Glucose-Capillary: 254 mg/dL — ABNORMAL HIGH (ref 70–99)

## 2019-09-22 LAB — INFLUENZA PANEL BY PCR (TYPE A & B)
Influenza A By PCR: NEGATIVE
Influenza B By PCR: NEGATIVE

## 2019-09-22 LAB — T4, FREE: Free T4: 0.91 ng/dL (ref 0.61–1.12)

## 2019-09-22 LAB — PHOSPHORUS: Phosphorus: 5.9 mg/dL — ABNORMAL HIGH (ref 2.5–4.6)

## 2019-09-22 LAB — SARS CORONAVIRUS 2 (TAT 6-24 HRS): SARS Coronavirus 2: NEGATIVE

## 2019-09-22 LAB — BRAIN NATRIURETIC PEPTIDE: B Natriuretic Peptide: 1885 pg/mL — ABNORMAL HIGH (ref 0.0–100.0)

## 2019-09-22 LAB — PROCALCITONIN: Procalcitonin: <0.1 ng/mL

## 2019-09-22 MED ORDER — HEPARIN SODIUM (PORCINE) 5000 UNIT/ML IJ SOLN
5000.0000 [IU] | Freq: Three times a day (TID) | INTRAMUSCULAR | Status: DC
  Administered 2019-09-22 – 2019-09-24 (×6): 5000 [IU] via SUBCUTANEOUS
  Filled 2019-09-22 (×6): qty 1

## 2019-09-22 MED ORDER — ESCITALOPRAM OXALATE 10 MG PO TABS
10.0000 mg | ORAL_TABLET | Freq: Every day | ORAL | Status: DC
  Administered 2019-09-22 – 2019-09-24 (×3): 10 mg via ORAL
  Filled 2019-09-22 (×4): qty 1

## 2019-09-22 MED ORDER — NITROGLYCERIN 0.4 MG SL SUBL
SUBLINGUAL_TABLET | SUBLINGUAL | Status: AC
  Administered 2019-09-22: 0.4 mg via SUBLINGUAL
  Filled 2019-09-22: qty 1

## 2019-09-22 MED ORDER — SODIUM CHLORIDE 0.9 % IV SOLN
2.0000 g | Freq: Once | INTRAVENOUS | Status: AC
  Administered 2019-09-22: 2 g via INTRAVENOUS
  Filled 2019-09-22: qty 20

## 2019-09-22 MED ORDER — FUROSEMIDE 10 MG/ML IJ SOLN
40.0000 mg | Freq: Once | INTRAMUSCULAR | Status: AC
  Administered 2019-09-22: 40 mg via INTRAVENOUS
  Filled 2019-09-22: qty 4

## 2019-09-22 MED ORDER — FUROSEMIDE 10 MG/ML IJ SOLN
40.0000 mg | Freq: Once | INTRAMUSCULAR | Status: AC
  Administered 2019-09-23: 40 mg via INTRAVENOUS
  Filled 2019-09-22: qty 4

## 2019-09-22 MED ORDER — METHYLPREDNISOLONE SODIUM SUCC 125 MG IJ SOLR
125.0000 mg | Freq: Once | INTRAMUSCULAR | Status: AC
  Administered 2019-09-22: 125 mg via INTRAVENOUS

## 2019-09-22 MED ORDER — NITROGLYCERIN IN D5W 200-5 MCG/ML-% IV SOLN
0.0000 ug/min | INTRAVENOUS | Status: DC
  Administered 2019-09-22: 100 ug/min via INTRAVENOUS

## 2019-09-22 MED ORDER — NITROGLYCERIN 0.4 MG SL SUBL
0.4000 mg | SUBLINGUAL_TABLET | SUBLINGUAL | Status: DC | PRN
  Administered 2019-09-22: 18:00:00 0.4 mg via SUBLINGUAL

## 2019-09-22 MED ORDER — ATORVASTATIN CALCIUM 20 MG PO TABS
40.0000 mg | ORAL_TABLET | Freq: Every day | ORAL | Status: DC
  Administered 2019-09-22 – 2019-09-23 (×2): 40 mg via ORAL
  Filled 2019-09-22 (×2): qty 2

## 2019-09-22 MED ORDER — SODIUM CHLORIDE 0.9% FLUSH
3.0000 mL | Freq: Two times a day (BID) | INTRAVENOUS | Status: DC
  Administered 2019-09-22 – 2019-09-24 (×5): 3 mL via INTRAVENOUS

## 2019-09-22 MED ORDER — METHYLPREDNISOLONE SODIUM SUCC 40 MG IJ SOLR
40.0000 mg | Freq: Three times a day (TID) | INTRAMUSCULAR | Status: DC
  Administered 2019-09-22: 40 mg via INTRAVENOUS
  Filled 2019-09-22: qty 1

## 2019-09-22 MED ORDER — FUROSEMIDE 10 MG/ML IJ SOLN
40.0000 mg | Freq: Once | INTRAMUSCULAR | Status: AC
  Administered 2019-09-22: 40 mg via INTRAVENOUS

## 2019-09-22 MED ORDER — CHLORHEXIDINE GLUCONATE CLOTH 2 % EX PADS
6.0000 | MEDICATED_PAD | Freq: Every day | CUTANEOUS | Status: DC
  Administered 2019-09-22: 6 via TOPICAL

## 2019-09-22 MED ORDER — CARVEDILOL 6.25 MG PO TABS
6.2500 mg | ORAL_TABLET | Freq: Two times a day (BID) | ORAL | Status: DC
  Administered 2019-09-22 – 2019-09-24 (×4): 6.25 mg via ORAL
  Filled 2019-09-22 (×4): qty 1

## 2019-09-22 MED ORDER — NITROGLYCERIN IN D5W 200-5 MCG/ML-% IV SOLN
INTRAVENOUS | Status: AC
  Filled 2019-09-22: qty 250

## 2019-09-22 MED ORDER — BUDESONIDE 0.5 MG/2ML IN SUSP
0.5000 mg | Freq: Two times a day (BID) | RESPIRATORY_TRACT | Status: DC
  Administered 2019-09-22 – 2019-09-24 (×4): 0.5 mg via RESPIRATORY_TRACT
  Filled 2019-09-22 (×4): qty 2

## 2019-09-22 MED ORDER — NITROGLYCERIN IN D5W 200-5 MCG/ML-% IV SOLN: 0.0000 ug/min | INTRAVENOUS | Status: DC

## 2019-09-22 MED ORDER — LORAZEPAM 2 MG/ML IJ SOLN
INTRAMUSCULAR | Status: AC
  Administered 2019-09-22: 1 mg via INTRAVENOUS
  Filled 2019-09-22: qty 1

## 2019-09-22 MED ORDER — IPRATROPIUM-ALBUTEROL 0.5-2.5 (3) MG/3ML IN SOLN
3.0000 mL | Freq: Four times a day (QID) | RESPIRATORY_TRACT | Status: DC
  Administered 2019-09-22 – 2019-09-24 (×6): 3 mL via RESPIRATORY_TRACT
  Filled 2019-09-22 (×7): qty 3

## 2019-09-22 MED ORDER — MORPHINE SULFATE (PF) 2 MG/ML IV SOLN: 1.0000 mg | INTRAVENOUS | Status: DC | PRN

## 2019-09-22 MED ORDER — NITROGLYCERIN 0.4 MG SL SUBL: 0.4000 mg | SUBLINGUAL_TABLET | SUBLINGUAL | Status: DC | PRN

## 2019-09-22 MED ORDER — LORAZEPAM 2 MG/ML IJ SOLN
1.0000 mg | Freq: Once | INTRAMUSCULAR | Status: AC
  Administered 2019-09-22: 18:00:00 1 mg via INTRAVENOUS

## 2019-09-22 MED ORDER — ALPRAZOLAM 0.5 MG PO TABS
0.2500 mg | ORAL_TABLET | Freq: Every day | ORAL | Status: DC
  Administered 2019-09-22 – 2019-09-23 (×2): 0.5 mg via ORAL
  Filled 2019-09-22 (×2): qty 1

## 2019-09-22 MED ORDER — SPIRONOLACTONE 25 MG PO TABS
12.5000 mg | ORAL_TABLET | Freq: Every day | ORAL | Status: DC
  Filled 2019-09-22: qty 0.5

## 2019-09-22 MED ORDER — METHYLPREDNISOLONE SODIUM SUCC 40 MG IJ SOLR
40.0000 mg | Freq: Two times a day (BID) | INTRAMUSCULAR | Status: DC
  Administered 2019-09-23: 40 mg via INTRAVENOUS
  Filled 2019-09-22: qty 1

## 2019-09-22 MED ORDER — SODIUM CHLORIDE 0.9 % IV SOLN
500.0000 mg | Freq: Once | INTRAVENOUS | Status: AC
  Administered 2019-09-22: 500 mg via INTRAVENOUS
  Filled 2019-09-22: qty 500

## 2019-09-22 MED ORDER — INSULIN ASPART 100 UNIT/ML ~~LOC~~ SOLN
0.0000 [IU] | SUBCUTANEOUS | Status: DC
  Administered 2019-09-22: 3 [IU] via SUBCUTANEOUS
  Administered 2019-09-22: 8 [IU] via SUBCUTANEOUS
  Administered 2019-09-23 (×2): 3 [IU] via SUBCUTANEOUS
  Administered 2019-09-23: 5 [IU] via SUBCUTANEOUS
  Administered 2019-09-23 (×2): 3 [IU] via SUBCUTANEOUS
  Administered 2019-09-23: 2 [IU] via SUBCUTANEOUS
  Administered 2019-09-24: 3 [IU] via SUBCUTANEOUS
  Administered 2019-09-24: 5 [IU] via SUBCUTANEOUS
  Administered 2019-09-24: 2 [IU] via SUBCUTANEOUS
  Filled 2019-09-22 (×10): qty 1

## 2019-09-22 MED ORDER — TRAZODONE HCL 50 MG PO TABS
25.0000 mg | ORAL_TABLET | Freq: Every day | ORAL | Status: DC
  Administered 2019-09-22 – 2019-09-23 (×2): 50 mg via ORAL
  Filled 2019-09-22 (×2): qty 1

## 2019-09-22 MED ORDER — METOPROLOL TARTRATE 5 MG/5ML IV SOLN
5.0000 mg | Freq: Once | INTRAVENOUS | Status: AC
  Administered 2019-09-22: 5 mg via INTRAVENOUS

## 2019-09-22 MED ORDER — IPRATROPIUM-ALBUTEROL 0.5-2.5 (3) MG/3ML IN SOLN
3.0000 mL | Freq: Four times a day (QID) | RESPIRATORY_TRACT | Status: DC | PRN
  Administered 2019-09-22: 3 mL via RESPIRATORY_TRACT
  Filled 2019-09-22: qty 3

## 2019-09-22 MED ORDER — INFLUENZA VAC A&B SA ADJ QUAD 0.5 ML IM PRSY
0.5000 mL | PREFILLED_SYRINGE | INTRAMUSCULAR | Status: AC
  Administered 2019-09-23: 0.5 mL via INTRAMUSCULAR
  Filled 2019-09-22: qty 0.5

## 2019-09-22 MED ORDER — ASPIRIN EC 81 MG PO TBEC
81.0000 mg | DELAYED_RELEASE_TABLET | Freq: Every day | ORAL | Status: DC
  Administered 2019-09-22 – 2019-09-24 (×3): 81 mg via ORAL
  Filled 2019-09-22 (×3): qty 1

## 2019-09-22 MED ORDER — METOPROLOL TARTRATE 5 MG/5ML IV SOLN
INTRAVENOUS | Status: AC
  Filled 2019-09-22: qty 5

## 2019-09-22 MED ORDER — MORPHINE SULFATE (PF) 2 MG/ML IV SOLN
2.0000 mg | Freq: Once | INTRAVENOUS | Status: AC
  Administered 2019-09-22: 2 mg via INTRAVENOUS
  Filled 2019-09-22: qty 1

## 2019-09-22 MED ORDER — PANTOPRAZOLE SODIUM 40 MG IV SOLR
40.0000 mg | INTRAVENOUS | Status: DC
  Administered 2019-09-22: 40 mg via INTRAVENOUS
  Filled 2019-09-22: qty 40

## 2019-09-22 MED ORDER — MAGNESIUM SULFATE 2 GM/50ML IV SOLN
2.0000 g | INTRAVENOUS | Status: AC
  Administered 2019-09-22: 2 g via INTRAVENOUS

## 2019-09-22 MED ORDER — CLOPIDOGREL BISULFATE 75 MG PO TABS
75.0000 mg | ORAL_TABLET | Freq: Every day | ORAL | Status: DC
  Administered 2019-09-22 – 2019-09-24 (×3): 75 mg via ORAL
  Filled 2019-09-22 (×3): qty 1

## 2019-09-22 NOTE — ED Notes (Signed)
Pt states that she feels like she can breathe better but still feels SOB. Duoneb given to provide relief of SOB.

## 2019-09-22 NOTE — ED Notes (Signed)
ED TO INPATIENT HANDOFF REPORT  ED Nurse Name and Phone #:  [place]  S Name/Age/Gender Ashley [name] 68 y.o. female Room/Bed: ED05A/ED05A  Code Status   Code Status: Full Code  Home/SNF/Other Home Patient oriented to: self, place, time and situation Is this baseline? Yes   Triage Complete: Triage complete  Chief Complaint sob ems  Triage Note Patient from home via [facility]. Patient reports worsening SOB this morning. Hx COPD and CHF. Patient given 4 albuterol treatments and 1 duoneb treatment by EMS. Patient with room air sat in 80's  Per EMS. Patient arrives on CPAP, in tripod position with accessory muscle use.    Allergies Allergies  Allergen Reactions  . Vancomycin Nausea And Vomiting and Palpitations  . Hydrocodone-Acetaminophen Nausea Only    Level of Care/Admitting Diagnosis ED Disposition    ED Disposition Condition [facility]: [place] [[id number]]  Level of Care: ICU [6]  Covid Evaluation: Confirmed COVID Negative  Diagnosis: Acute respiratory failure with hypoxia [facility]) [[id number]]  Admitting Physician: Cherylann Ratel  Attending Physician: Cherylann Ratel  Estimated length of stay: 3 - 4 days  Certification:: I certify this patient will need inpatient services for at least 2 midnights  PT Class (Do Not Modify): Inpatient [101]  PT Acc Code (Do Not Modify): Private [1]       B Medical/Surgery History Past Medical History:  Diagnosis Date  . AICD (automatic cardioverter/defibrillator) present    on right side  . Anemia   . CAD (coronary artery disease)    s/p CABG  . CHF (congestive heart failure) ([place])   . Chronic kidney disease    renal artery stenosis  . Colon cancer ([place])   . Depression   . GERD (gastroesophageal reflux disease)   . Hx of colonic polyps   . Hyperlipidemia   . Hypertension   . Mitral valve disorder    s/p mitral valve repair wth CABG  . Myocardial infarction ([place])    . Peripheral vascular disease ([place])   . Presence of permanent cardiac pacemaker    Pacemaker/ Defibrillator  . Tobacco abuse 10/11/2014   Past Surgical History:  Procedure Laterality Date  . arm surgery     fracture, has plates and screws  . CABG with mitral valve repair    . CARDIAC CATHETERIZATION    . COLON SURGERY     colon cancer  . COLONOSCOPY WITH PROPOFOL N/A 10/09/2016   Procedure: COLONOSCOPY WITH PROPOFOL;  Surgeon: Jonathon Bellows, MD;  Location: [facility] ENDOSCOPY;  Service: Endoscopy;  Laterality: N/A;  . COLONOSCOPY WITH PROPOFOL N/A 07/24/2018   Procedure: COLONOSCOPY WITH PROPOFOL;  Surgeon: Virgel Manifold, MD;  Location: [facility] ENDOSCOPY;  Service: Endoscopy;  Laterality: N/A;  . CORONARY ARTERY BYPASS GRAFT    . ENTEROSCOPY N/A 11/21/2018   Procedure: ENTEROSCOPY-BALLOON;  Surgeon: Jonathon Bellows, MD;  Location: [facility] ENDOSCOPY;  Service: Gastroenterology;  Laterality: N/A;  . ESOPHAGOGASTRODUODENOSCOPY (EGD) WITH PROPOFOL N/A 07/24/2018   Procedure: ESOPHAGOGASTRODUODENOSCOPY (EGD) WITH PROPOFOL;  Surgeon: Virgel Manifold, MD;  Location: [facility] ENDOSCOPY;  Service: Endoscopy;  Laterality: N/A;  . GIVENS CAPSULE STUDY N/A 08/16/2018   Procedure: GIVENS CAPSULE STUDY;  Surgeon: Virgel Manifold, MD;  Location: [facility] ENDOSCOPY;  Service: Endoscopy;  Laterality: N/A;  . pace maker defib  2016  . PERIPHERAL VASCULAR CATHETERIZATION N/A 10/23/2016   Procedure: Renal Angiography;  Surgeon: Algernon Huxley, MD;  Location: [facility];  Service: Cardiovascular;  Laterality: N/A;  . RIGHT/LEFT HEART CATH AND CORONARY ANGIOGRAPHY N/A 08/26/2019   Procedure: RIGHT/LEFT HEART CATH AND CORONARY ANGIOGRAPHY;  Surgeon: Isaias Cowman, MD;  Location: [place] [facility];  Service: Cardiovascular;  Laterality: N/A;  . TOTAL ABDOMINAL HYSTERECTOMY  1999   history of abnormal pap     A IV Location/Drains/Wounds Patient Lines/Drains/Airways Status   Active  Line/Drains/Airways    Name:   Placement date:   Placement time:   Site:   Days:   Peripheral IV 09/22/19 Right Antecubital   09/22/19    0730    Antecubital   less than 1   Peripheral IV 09/22/19 Right Hand   09/22/19    0730    Hand   less than 1   External Urinary Catheter   09/22/19    0930    -   less than 1          Intake/Output Last 24 hours  Intake/Output Summary (Last 24 hours) at 09/22/2019 2007 Last data filed at 09/22/2019 1650 Gross per 24 hour  Intake 153.35 ml  Output 650 ml  Net -496.65 ml    Labs/Imaging Results for orders placed or performed during the hospital encounter of 09/22/19 (from the past 48 hour(s))  Blood gas, venous     Status: Abnormal   Collection Time: 09/22/19  7:19 AM  Result Value Ref Range   pH, Ven 7.31 7.250 - 7.430   pCO2, Ven 45 44.0 - 60.0 mmHg   pO2, Ven 81.0 (H) 32.0 - 45.0 mmHg   Bicarbonate 22.7 20.0 - 28.0 mmol/L   Acid-base deficit 3.7 (H) 0.0 - 2.0 mmol/L   O2 Saturation 94.7 %   Patient temperature 37.0    Collection site VEIN    Sample type VEIN     Comment: Performed at [facility], [address] [place], [place] [address]  Basic metabolic panel     Status: Abnormal   Collection Time: 09/22/19  7:19 AM  Result Value Ref Range   Sodium 136 135 - 145 mmol/L   Potassium 5.1 3.5 - 5.1 mmol/L   Chloride 101 98 - 111 mmol/L   CO2 21 (L) 22 - 32 mmol/L   Glucose, Bld 413 (H) 70 - 99 mg/dL   BUN 31 (H) 8 - 23 mg/dL   Creatinine, Ser 1.44 (H) 0.44 - 1.00 mg/dL   Calcium 9.2 8.9 - 10.3 mg/dL   GFR calc non Af Amer 38 (L) >60 mL/min   GFR calc Af Amer 44 (L) >60 mL/min   Anion gap 14 5 - 15    Comment: Performed at [facility], [place], [place] [address]  Troponin I (High Sensitivity)     Status: Abnormal   Collection Time: 09/22/19  7:19 AM  Result Value Ref Range   Troponin I (High Sensitivity) 19 (H) <18 ng/L    Comment: (NOTE) Elevated high sensitivity troponin I (hsTnI) values  and significant  changes across serial measurements may suggest ACS but many other  chronic and acute conditions are known to elevate hsTnI results.  Refer to the "Links" section for chest pain algorithms and additional  guidance. Performed at [facility], [address] [place], [place] [address]   CBC with Differential     Status: Abnormal   Collection Time: 09/22/19  7:19 AM  Result Value Ref Range   WBC 12.1 (H) 4.0 - 10.5 K/uL   RBC 4.72 3.87 -  5.11 MIL/uL   Hemoglobin 14.9 12.0 - 15.0 g/dL   HCT 44.4 36.0 - 46.0 %   MCV 94.1 80.0 - 100.0 fL   MCH 31.6 26.0 - 34.0 pg   MCHC 33.6 30.0 - 36.0 g/dL   RDW 15.2 11.5 - 15.5 %   Platelets 342 150 - 400 K/uL   nRBC 0.0 0.0 - 0.2 %   Neutrophils Relative % 65 %   Neutro Abs 8.0 (H) 1.7 - 7.7 K/uL   Lymphocytes Relative 21 %   Lymphs Abs 2.5 0.7 - 4.0 K/uL   Monocytes Relative 8 %   Monocytes Absolute 1.0 0.1 - 1.0 K/uL   Eosinophils Relative 3 %   Eosinophils Absolute 0.3 0.0 - 0.5 K/uL   Basophils Relative 2 %   Basophils Absolute 0.2 (H) 0.0 - 0.1 K/uL   Immature Granulocytes 1 %   Abs Immature Granulocytes 0.10 (H) 0.00 - 0.07 K/uL    Comment: Performed at [facility], [address] [place], [place] [address]  Procalcitonin - Baseline     Status: None   Collection Time: 09/22/19  7:19 AM  Result Value Ref Range   Procalcitonin <0.10 ng/mL    Comment:        Interpretation: PCT (Procalcitonin) <= 0.5 ng/mL: Systemic infection (sepsis) is not likely. Local bacterial infection is possible. (NOTE)       Sepsis PCT Algorithm           Lower Respiratory Tract                                      Infection PCT Algorithm    ----------------------------     ----------------------------         PCT < 0.25 ng/mL                PCT < 0.10 ng/mL         Strongly encourage             Strongly discourage   discontinuation of antibiotics    initiation of antibiotics    ----------------------------      -----------------------------       PCT 0.25 - 0.50 ng/mL            PCT 0.10 - 0.25 ng/mL               OR       >80% decrease in PCT            Discourage initiation of                                            antibiotics      Encourage discontinuation           of antibiotics    ----------------------------     -----------------------------         PCT >= 0.50 ng/mL              PCT 0.26 - 0.50 ng/mL               AND        <80% decrease in PCT             Encourage initiation of  antibiotics       Encourage continuation           of antibiotics    ----------------------------     -----------------------------        PCT >= 0.50 ng/mL                  PCT > 0.50 ng/mL               AND         increase in PCT                  Strongly encourage                                      initiation of antibiotics    Strongly encourage escalation           of antibiotics                                     -----------------------------                                           PCT <= 0.25 ng/mL                                                 OR                                        > 80% decrease in PCT                                     Discontinue / Do not initiate                                             antibiotics Performed at [facility], [address] [place], [place] [address]   Brain natriuretic peptide     Status: Abnormal   Collection Time: 09/22/19  7:20 AM  Result Value Ref Range   B Natriuretic Peptide 1,885.0 (H) 0.0 - 100.0 pg/mL    Comment: Performed at [facility], [place], [place] [address]  SARS CORONAVIRUS 2 (TAT 6-24 HRS) Nasopharyngeal Nasopharyngeal Swab     Status: None   Collection Time: 09/22/19  7:20 AM   Specimen: Nasopharyngeal Swab  Result Value Ref Range   SARS Coronavirus 2 NEGATIVE NEGATIVE    Comment: (NOTE) SARS-CoV-2 target nucleic acids are NOT  DETECTED. The SARS-CoV-2 RNA is generally detectable in upper and lower respiratory specimens during the acute phase of infection. Negative results do not preclude SARS-CoV-2 infection, do not rule out co-infections with other pathogens, and should not be used as the sole basis for treatment or other patient management decisions. Negative results must be combined with clinical  observations, patient history, and epidemiological information. The expected result is Negative. Fact Sheet for Patients: SugarRoll.be Fact Sheet for Healthcare Providers: https://www.woods-mathews.com/ This test is not yet approved or cleared by the [place] FDA and  has been authorized for detection and/or diagnosis of SARS-CoV-2 by FDA under an Emergency Use Authorization (EUA). This EUA will remain  in effect (meaning this test can be used) for the duration of the COVID-19 declaration under Section 56 4(b)(1) of the Act, 21 U.S.C. section 360bbb-3(b)(1), unless the authorization is terminated or revoked sooner. Performed at [facility], [place] [address], [place], [place] [address]   Influenza panel by PCR (type A & B)     Status: None   Collection Time: 09/22/19  7:20 AM  Result Value Ref Range   Influenza A By PCR NEGATIVE NEGATIVE   Influenza B By PCR NEGATIVE NEGATIVE    Comment: (NOTE) The Xpert Xpress Flu assay is intended as an aid in the diagnosis of  influenza and should not be used as a sole basis for treatment.  This  assay is FDA approved for nasopharyngeal swab specimens only. Nasal  washings and aspirates are unacceptable for Xpert Xpress Flu testing. Performed at [facility], [place], [place] [address]   Troponin I (High Sensitivity)     Status: Abnormal   Collection Time: 09/22/19  9:22 AM  Result Value Ref Range   Troponin I (High Sensitivity) 19 (H) <18 ng/L    Comment: (NOTE) Elevated high sensitivity  troponin I (hsTnI) values and significant  changes across serial measurements may suggest ACS but many other  chronic and acute conditions are known to elevate hsTnI results.  Refer to the "Links" section for chest pain algorithms and additional  guidance. Performed at [facility], [address] [place], [place] [address]   Type and screen     Status: None   Collection Time: 09/22/19  3:32 PM  Result Value Ref Range   ABO/RH(D) B POS    Antibody Screen NEG    Sample Expiration      09/25/2019,2359 Performed at [facility], [address] [place], [place] [address]   T4, free     Status: None   Collection Time: 09/22/19  3:35 PM  Result Value Ref Range   Free T4 0.91 0.61 - 1.12 ng/dL    Comment: (NOTE) Biotin ingestion may interfere with free T4 tests. If the results are inconsistent with the TSH level, previous test results, or the clinical presentation, then consider biotin interference. If needed, order repeat testing after stopping biotin. Performed at [facility], [address] [place], [place] [address]   Blood gas, arterial ([facility] & [facility] ONLY)     Status: Abnormal   Collection Time: 09/22/19  5:25 PM  Result Value Ref Range   FIO2 0.28    Delivery systems NASAL CANNULA    pH, Arterial 7.42 7.350 - 7.450   pCO2 arterial 30 (L) 32.0 - 48.0 mmHg   pO2, Arterial 81 (L) 83.0 - 108.0 mmHg   Bicarbonate 19.5 (L) 20.0 - 28.0 mmol/L   Acid-base deficit 3.9 (H) 0.0 - 2.0 mmol/L   O2 Saturation 96.1 %   Patient temperature 37.0    Collection site RIGHT RADIAL    Sample type ARTERIAL DRAW    Allens test (pass/fail) POSITIVE (A) PASS    Comment: Performed at [facility], [address] [place],  [address]  Troponin I (  High Sensitivity)     Status: Abnormal   Collection Time: 09/22/19  5:57 PM  Result Value Ref Range   Troponin I (High Sensitivity) 28 (H) <18 ng/L    Comment: (NOTE) Elevated high sensitivity troponin I  (hsTnI) values and significant  changes across serial measurements may suggest ACS but many other  chronic and acute conditions are known to elevate hsTnI results.  Refer to the "Links" section for chest pain algorithms and additional  guidance. Performed at [facility], [address] [place], [place] [address]   Magnesium     Status: None   Collection Time: 09/22/19  5:57 PM  Result Value Ref Range   Magnesium 2.3 1.7 - 2.4 mg/dL    Comment: Performed at [facility], [address] [place], [place] [address]  Phosphorus     Status: Abnormal   Collection Time: 09/22/19  5:57 PM  Result Value Ref Range   Phosphorus 5.9 (H) 2.5 - 4.6 mg/dL    Comment: Performed at [facility], [address] [place], [place] [address]   Dg Chest 2 View  Result Date: 09/22/2019 CLINICAL DATA:  Shortness of breath, cough. EXAM: CHEST - 2 VIEW COMPARISON:  Same day. FINDINGS: Stable cardiomegaly. Status post coronary bypass graft. No pneumothorax is noted. Right-sided pacemaker is unchanged in position. Stable diffuse interstitial densities are noted throughout both lungs concerning for pulmonary edema with probable small bilateral pleural effusions. Bony thorax is unremarkable. IMPRESSION: Stable cardiomegaly with bilateral pulmonary edema and probable small bilateral pleural effusions. Electronically Signed   By: Marijo Conception M.D.   On: 09/22/2019 13:35   Dg Chest Portable 1 View  Result Date: 09/22/2019 CLINICAL DATA:  Admitted to [facility] for shortness of breath, initially weaned off BiPAP, now worsening EXAM: PORTABLE CHEST 1 VIEW COMPARISON:  Radiograph 09/22/2019 FINDINGS: Cardiac pacer pack overlies the right chest wall with leads at the right atrium, coronary sinus and cardiac apex. Postsurgical changes related to prior CABG including intact and aligned sternotomy wires and multiple surgical clips projecting over the mediastinum. Distal left clavicular plate screw  fixation hardware is noted. Degenerative changes are present in the imaged spine and shoulders. Worsening bilateral interstitial and airspace opacities most pronounced in the right lung. Associated septal thickening is present with central vascular cuffing. Mild cardiomegaly, similar to prior accounting for differences in technique. No pneumothorax. No visible effusion. IMPRESSION: Worsening bilateral interstitial and airspace opacities, most pronounced in the right lung, consistent with worsening pulmonary edema. Superimposed infection cannot be excluded. Electronically Signed   By: Lovena Le M.D.   On: 09/22/2019 18:16   Dg Chest Portable 1 View  Result Date: 09/22/2019 CLINICAL DATA:  Shortness of breath EXAM: PORTABLE CHEST 1 VIEW COMPARISON:  August 25, 2019 FINDINGS: There is cardiomegaly with pulmonary venous hypertension. There is generalized interstitial edema. There is atelectatic change in the right base and right upper lobe. There is no airspace consolidation. Pacemaker leads are attached to the right atrium, right ventricle, and coronary sinus. Patient is status post coronary artery bypass grafting. No adenopathy. There is aortic atherosclerosis. There is postoperative change in the left clavicle. IMPRESSION: Cardiomegaly with pulmonary venous hypertension. There is interstitial pulmonary edema. The appearance is felt to be indicative of a degree of congestive heart failure. Areas of atelectatic change on the right. No consolidation. Pacemaker leads attached to right atrium, right ventricle, and coronary sinus. Status post coronary artery bypass grafting. Aortic Atherosclerosis (ICD10-I70.0). Electronically Signed  By: Lowella Grip III M.D.   On: 09/22/2019 07:49    Pending Labs Unresulted Labs (From admission, onward)    Start     Ordered   09/23/19 0500  Comprehensive metabolic panel  Tomorrow morning,   STAT     09/22/19 1140   09/23/19 0500  Vitamin B12  (Anemia Panel (PNL))   Tomorrow morning,   STAT     09/22/19 1247   09/23/19 0500  Folate  (Anemia Panel (PNL))  Tomorrow morning,   STAT     09/22/19 1247   09/23/19 0500  Iron and TIBC  (Anemia Panel (PNL))  Tomorrow morning,   STAT     09/22/19 1247   09/23/19 0500  Ferritin  (Anemia Panel (PNL))  Tomorrow morning,   STAT     09/22/19 1247   09/23/19 0500  Reticulocytes  (Anemia Panel (PNL))  Tomorrow morning,   STAT     09/22/19 1247   09/23/19 0500  TSH  Tomorrow morning,   STAT     09/22/19 1247   09/22/19 1247  Lyme disease, western blot  Add-on,   AD     09/22/19 1247   09/22/19 1247  ANA w/Reflex if Positive  Once,   STAT     09/22/19 1247   09/22/19 1246  Occult blood card to lab, stool RN will collect  Once,   STAT    Question:  Specimen to be collected by:  Answer:  RN will collect   09/22/19 1247          Vitals/Pain Today's Vitals   09/22/19 1915 09/22/19 1924 09/22/19 1945 09/22/19 2000  BP: 109/76  117/68 126/72  Pulse:   (!) 37 (!) 34  Resp: 20  20 17   Temp:      TempSrc:      SpO2:   100% 92%  Weight:      Height:      PainSc:  0-No pain      Isolation Precautions No active isolations  Medications Medications  heparin injection 5,000 Units (5,000 Units Subcutaneous Given 09/22/19 1521)  sodium chloride flush (NS) 0.9 % injection 3 mL (3 mLs Intravenous Given 09/22/19 1212)  aspirin EC tablet 81 mg (81 mg Oral Given 09/22/19 1152)  atorvastatin (LIPITOR) tablet 40 mg (has no administration in time range)  carvedilol (COREG) tablet 6.25 mg (6.25 mg Oral Given 09/22/19 1650)  ALPRAZolam (XANAX) tablet 0.25-0.5 mg (has no administration in time range)  escitalopram (LEXAPRO) tablet 10 mg (10 mg Oral Given 09/22/19 1211)  traZODone (DESYREL) tablet 25-50 mg (has no administration in time range)  clopidogrel (PLAVIX) tablet 75 mg (75 mg Oral Given 09/22/19 1211)  ipratropium-albuterol (DUONEB) 0.5-2.5 (3) MG/3ML nebulizer solution 3 mL (3 mLs Nebulization Given 09/22/19 1655)   methylPREDNISolone sodium succinate (SOLU-MEDROL) 40 mg/mL injection 40 mg (40 mg Intravenous Given 09/22/19 1331)  pantoprazole (PROTONIX) injection 40 mg (40 mg Intravenous Given 09/22/19 1329)  nitroGLYCERIN (NITROSTAT) SL tablet 0.4 mg (has no administration in time range)  morphine 2 MG/ML injection 1 mg (has no administration in time range)  nitroGLYCERIN 50 mg in dextrose 5 % 250 mL (0.2 mg/mL) infusion (50 mcg/min Intravenous Rate/Dose Change 09/22/19 1840)  methylPREDNISolone sodium succinate (SOLU-MEDROL) 125 mg/2 mL injection 125 mg (125 mg Intravenous Given 09/22/19 0731)  magnesium sulfate IVPB 2 g 50 mL (0 g Intravenous Stopped 09/22/19 0803)  cefTRIAXone (ROCEPHIN) 2 g in sodium chloride 0.9 % 100 mL IVPB ( Intravenous Stopped  09/22/19 0851)  azithromycin (ZITHROMAX) 500 mg in sodium chloride 0.9 % 250 mL IVPB (0 mg Intravenous Stopped 09/22/19 0927)  furosemide (LASIX) injection 40 mg (40 mg Intravenous Given 09/22/19 0927)  furosemide (LASIX) injection 40 mg (40 mg Intravenous Given 09/22/19 1718)  morphine 2 MG/ML injection 2 mg (2 mg Intravenous Given 09/22/19 1735)  metoprolol tartrate (LOPRESSOR) injection 5 mg (5 mg Intravenous Given 09/22/19 1740)  LORazepam (ATIVAN) injection 1 mg (1 mg Intravenous Given 09/22/19 1745)    Mobility walks Low fall risk   Focused Assessments n/a   R Recommendations: See Admitting Provider Note  Report given to:   Additional Notes: n/a

## 2019-09-22 NOTE — Progress Notes (Signed)
Received test about pt's being sob and increased work of breathing stat abg/ cpap and rt consult was ordered. 2 mg of morphine/ lasix 40 mg iv stat/ and notified about her condition worsening. Went to assess patient, ED MD dr. Alford Highland at bedside, stat ntg drip started for high blood pressure. Suspect went into flash pulmonary edema secondary to hypertensive emergency and chest pain. Pt was immediately started on cpap given 1 mg of ativan with ntg drip bp came down to 485'I systolic and  Pt was clinically stabilized and transfer from PCU to ICU. We will cont pt in NTG drip and wean off when BP control is optimal and recycle cardiac enzymes.  Stat chest xray.

## 2019-09-22 NOTE — Patient Outreach (Signed)
[facility]) Care Management [place] Telephone Outreach Care Coordination   09/22/2019  Ashley [name] 1950-11-12 [id number]  [place], 68 y/o [place] by [facility] Liaison RN CM after recent hospitalization July 21-25, 2020 for acute on chronic CHF exacerbation.Patientwas discharged from hospital to home/ self-care without home health services in place. Patient has history including, but not limited to, combined CHF; CAD with previous MI/ CABG, and ICD; HTN/ HLD; GERD; COPD;history oftobacco use- patient has quit smoking since June 19, 2019.  Unfortunately, patient experiencedmultiplerecenthospital readmissions: 1)August 13-15, 2020 for acute respiratory failure with hypoxia/18 days after her hospital discharge, ; patient was again discharged home without home health services in place. 2) September 9-12, 2020 for acute pulmonary edema; respiratory failure with hypoxia; and CHF exacerbation/ 25 days after previous hospital discharge; was again discharged home without home health services in place, as patient continually refuses need for home health. 3) ED visit 08/02/2019 for chest pain; patient was also diagnosed with ETOH intoxication and discharged homeafter being ruled out for ACS/ MI 4)October 6-9,2020,24 days after her last hospital discharge, again for COPD/ CHF exacerbation. 5) October 18-21, 2020, 9 days after last hospital discharge; Acute Respiratory failure with flash pulmonary edema, secondary to CHF exacerbation/ severe MS/ MR: patient had cardiac catherization during this hospital admission and was determined to have significant 3-vessel CAD and EF of 20-25%  Noted through review of EMR that patient presented to ED earlier today, 09/22/2019, 26 days after her last hospital discharge, again with acute respiratory failure/ CHF  exacerbation.  Secure communication via EMR sent to [place] and Dannielle Huh, as well as [place], notifying of patient's current hospital admission  Plan:  [place] RN CM will follow patient's progress while hospitalized and collaborate with [facility] liaison's for discharge disposition  [name], RN, BSN, [place] Coordinator [facility] Care Management  [phone]

## 2019-09-22 NOTE — ED Notes (Signed)
Pt states that she feels like she is unable to breathe and get air into lungs. Pt is visibly experiencing increase work of breathing, SOB, and use of accessory muscles. When asked if this is how the pt felt before arriving to the ED, she states "not as bad but almost as bad as before". Pt bp is 167/123, HR 117. SpO2 is 96%, MD made aware.

## 2019-09-22 NOTE — ED Provider Notes (Signed)
Notified by patient's nurse that her respiratory status is decompensating.  Patient met with hospitalist for boarding in the ER.  Evaluate patient immediately and she appeared to be in acute respiratory failure with hypoxia.  Appeared cyanotic.  Blood pressure reading in the 200s in the past several readings with diastolics greater than 130.  Bedside ultrasound shows B-lines.  Crackles on exam with diminished breath sounds throughout.  Markedly tachycardic as well.  Certainly concerning for flash pulmonary edema.  Patient was given IV Lopressor as well as started on IV infusion after sublingual nitro initiated.  Patient placed on BiPAP for respiratory support.  Also given some Ativan for anxiolysis.  Will closely monitor may require intubation but clinically she is improving.   ----------------------------------------- 8:05 PM on 09/22/2019 -----------------------------------------  Patient clinically improved blood pressure controlled heart rate improved.  Still on BiPAP.  .Critical Care Performed by: Robinson, Patrick, MD Authorized by: Robinson, Patrick, MD   Critical care provider statement:    Critical care time (minutes):  30   Critical care time was exclusive of:  Separately billable procedures and treating other patients   Critical care was time spent personally by me on the following activities:  Development of treatment plan with patient or surrogate, discussions with consultants, evaluation of patient's response to treatment, examination of patient, obtaining history from patient or surrogate, ordering and performing treatments and interventions, ordering and review of laboratory studies, ordering and review of radiographic studies, pulse oximetry, re-evaluation of patient's condition and review of old charts      Robinson, Patrick, MD 09/22/19 2005  

## 2019-09-22 NOTE — Consult Note (Signed)
Name: Ashley [name] MRN: [id number] DOB: [birth year]    ADMISSION DATE:  09/22/2019 CONSULTATION DATE: 09/22/2019  REFERRING MD : Rufina Falco, NP  CHIEF COMPLAINT: Shortness of Breath   BRIEF PATIENT DESCRIPTION:  68 yo female admitted with acute on chronic hypoxic respiratory failure secondary to AECOPD, acute on chronic systolic CHF, and moderate to severe pulmonary hypertension requiring Bipap   SIGNIFICANT EVENTS/STUDIES:  11/16: Pt admitted to the stepdown unit on Bipap   HISTORY OF PRESENT ILLNESS:   This is a 68 yo female with a PMH of AICD, PVD, MI, Mitral Valve Disorder s/p Mitral Valve Repair with CABG, HTN, HLD, GERD, Colonic Polyps, Colon Cancer, Depression, Chronic Systolic CHF (EF 60-60% status post BiV ICD), CAD, and Anemia.  She presented to [facility] ER on 11/16 via EMS with c/o shortness of breath onset of symptoms today. Per ER notes EMS administered 4 albuterol treatments, duoneb x1, and pt placed on CPAP due to O2 sats in the 80's on RA.  Upon arrival to the ER pt in tripod position with use of accessory muscles, therefore pt transitioned to Bipap. She received iv solumedrol and magnesium bolus.  CXR concerning for cardiomegaly with pulmonary venous hypertension, and interstitial pulmonary edema.  Lab results revealed CO2 21, glucose 413, BUN 31, creatinine 1.44, BNP 1,885, troponin 19, wbc 12.1, COVID-19 negative, and influenza pcr negative.  She also received 80 mg iv lasix, ceftriaxone, and azithromycin.  She required nitroglycerin gtt due to hypertension sbp 160-190's.  She was subsequently admitted to the stepdown unit per hospitalist team for additional workup and treatment.  PCCM consulted for management of acute on chronic hypoxic respiratory failure.    PAST MEDICAL HISTORY :   has a past medical history of AICD (automatic cardioverter/defibrillator) present, Anemia, CAD (coronary artery disease), CHF (congestive heart failure) ([place]), Chronic kidney disease,  Colon cancer ([place]), Depression, GERD (gastroesophageal reflux disease), colonic polyps, Hyperlipidemia, Hypertension, Mitral valve disorder, Myocardial infarction [facility]), Peripheral vascular disease ([employer]), Presence of permanent cardiac pacemaker, and Tobacco abuse (10/11/2014).  has a past surgical history that includes Total abdominal hysterectomy (1999); CABG with mitral valve repair; Colon surgery; arm surgery; pace maker defib (2016); Coronary artery bypass graft; Colonoscopy with propofol (N/A, 10/09/2016); Cardiac catheterization (N/A, 10/23/2016); Colonoscopy with propofol (N/A, 07/24/2018); Esophagogastroduodenoscopy (egd) with propofol (N/A, 07/24/2018); Givens capsule study (N/A, 08/16/2018); Cardiac catheterization; enteroscopy (N/A, 11/21/2018); and RIGHT/LEFT HEART CATH AND CORONARY ANGIOGRAPHY (N/A, 08/26/2019). Prior to Admission medications   Medication Sig Start Date End Date Taking? Authorizing Provider  albuterol (VENTOLIN HFA) 108 (90 Base) MCG/ACT inhaler INHALE 2 PUFFS BY MOUTH INTO THE LUNGS EVERY 6 HOURS AS NEEDED FOR WHEEZING Patient taking differently: Inhale 2 puffs into the lungs every 6 (six) hours as needed for wheezing.  09/04/19  Yes Tyler Pita, MD  ALPRAZolam Duanne Moron) 0.5 MG tablet Take 0.5-1 tablets (0.25-0.5 mg total) by mouth at bedtime. TAKE ONE TABLET BY MOUTH AT BEDTIME AS NEEDED FOR ANXIETY Patient taking differently: Take 0.25-0.5 mg by mouth at bedtime.  09/05/19  Yes [name], MD  aspirin 81 MG tablet Take 81 mg by mouth daily.   Yes [provider]  atorvastatin (LIPITOR) 40 MG tablet TAKE ONE TABLET BY MOUTH ONCE DAILY Patient taking differently: Take 40 mg by mouth daily.  07/25/19  Yes [name], MD  carvedilol (COREG) 6.25 MG tablet Take 1 tablet (6.25 mg total) by mouth 2 (two) times daily with a meal. 09/05/19  Yes Deborra Medina  L, MD  clopidogrel (PLAVIX) 75 MG tablet Take 1 tablet (75 mg total) by mouth daily. 09/05/19  Yes  Hackney, Otila Kluver A, FNP  cyanocobalamin (,VITAMIN B-12,) 1000 MCG/ML injection Inject 1 mL into the muscle monthly 09/05/19  Yes [name], MD  diphenhydrAMINE (DIPHENHIST) 25 mg capsule Take 25 mg by mouth at bedtime as needed for allergies or sleep.    Yes [provider]  escitalopram (LEXAPRO) 10 MG tablet Take 1 tablet (10 mg total) by mouth daily. 09/05/19  Yes [name], MD  ferrous sulfate 325 (65 FE) MG EC tablet Take 325 mg by mouth daily with breakfast.   Yes [provider]  Fluticasone-Umeclidin-Vilant (TRELEGY ELLIPTA) 100-62.5-25 MCG/INH AEPB Inhale 1 puff into the lungs daily. 09/04/19  Yes Tyler Pita, MD  furosemide (LASIX) 40 MG tablet Take 1 tablet (40 mg total) by mouth 3 (three) times daily. 09/05/19  Yes Hackney, Otila Kluver A, FNP  omeprazole (PRILOSEC) 40 MG capsule Take 1 capsule (40 mg total) by mouth daily. 09/05/19  Yes [name], MD  potassium chloride (KLOR-CON) 10 MEQ tablet Take 1 tablet (10 mEq total) by mouth daily. 09/05/19  Yes [name], MD  sacubitril-valsartan (ENTRESTO) 24-26 MG Take 1 tablet by mouth 2 (two) times daily. 09/05/19  Yes Hackney, Otila Kluver A, FNP  spironolactone (ALDACTONE) 25 MG tablet Take 0.5 tablets (12.5 mg total) by mouth daily. 09/05/19 09/04/20 Yes Hackney, Otila Kluver A, FNP  traZODone (DESYREL) 50 MG tablet Take 0.5-1 tablets (25-50 mg total) by mouth daily. One hour before bedtime Patient taking differently: Take 25-50 mg by mouth at bedtime. One hour before bedtime 09/05/19  Yes [name], MD  acetaminophen (TYLENOL) 500 MG tablet Maximum 6 tablets a day. Patient not taking: Reported on 09/04/2019 02/09/15   Rubbie Battiest, RN  ipratropium-albuterol (DUONEB) 0.5-2.5 (3) MG/3ML SOLN Take 3 mLs by nebulization every 6 (six) hours as needed. Patient not taking: Reported on 09/04/2019 08/05/18   [name], MD  nitroGLYCERIN (NITROSTAT) 0.4 MG SL tablet Place 1 tablet (0.4 mg total) under the tongue  every 5 (five) minutes as needed for chest pain. Patient not taking: Reported on 09/04/2019 09/03/19   [name], MD   Allergies  Allergen Reactions   Vancomycin Nausea And Vomiting and Palpitations   Hydrocodone-Acetaminophen Nausea Only    FAMILY HISTORY:  family history includes Arthritis in her maternal grandmother and mother; Cancer in her mother and sister; Diabetes in her father, maternal grandmother, and mother; Heart disease in her brother, father, mother, and paternal grandfather; Hyperlipidemia in her father and mother; Hypertension in her father, maternal grandfather, maternal grandmother, mother, paternal grandfather, and paternal grandmother; Kidney disease in her brother. SOCIAL HISTORY:  reports that she quit smoking about 3 months ago. Her smoking use included cigarettes. She smoked 0.25 packs per day. She has never used smokeless tobacco. She reports current alcohol use of about 2.0 standard drinks of alcohol per week. She reports that she does not use drugs.  REVIEW OF SYSTEMS: Positives in BOLD  Constitutional: Negative for fever, chills, weight loss, malaise/fatigue and diaphoresis.  HENT: Negative for hearing loss, ear pain, nosebleeds, congestion, sore throat, neck pain, tinnitus and ear discharge.   Eyes: Negative for blurred vision, double vision, photophobia, pain, discharge and redness.  Respiratory: cough, hemoptysis, sputum production, shortness of breath, wheezing and stridor.   Cardiovascular: Negative for chest pain, palpitations, orthopnea, claudication, leg swelling and PND.  Gastrointestinal: Negative for  heartburn, nausea, vomiting, abdominal pain, diarrhea, constipation, blood in stool and melena.  Genitourinary: Negative for dysuria, urgency, frequency, hematuria and flank pain.  Musculoskeletal: Negative for myalgias, back pain, joint pain and falls.  Skin: Negative for itching and rash.  Neurological: Negative for dizziness, tingling, tremors,  sensory change, speech change, focal weakness, seizures, loss of consciousness, weakness and headaches.  Endo/Heme/Allergies: Negative for environmental allergies and polydipsia. Does not bruise/bleed easily.  SUBJECTIVE:  No complaints at this time   VITAL SIGNS: Temp:  [97.4 F (36.3 C)] 97.4 F (36.3 C) (11/16 0809) Pulse Rate:  [34-115] 39 (11/16 2015) Resp:  [15-32] 18 (11/16 2015) BP: (109-194)/(68-148) 131/76 (11/16 2015) SpO2:  [92 %-100 %] 100 % (11/16 2015) Weight:  [84.6 kg] 84.6 kg (11/16 0725)  PHYSICAL EXAMINATION: General: well developed, well nourished female, NAD on Bipap  Neuro: alert and oriented, follows commands  HEENT: supple, mild JVD  Cardiovascular: nsr, rrr, no R/G Lungs: diminished throughout, even, non labored  Abdomen: +BS x4, soft, obese, non tender, non distended Musculoskeletal: normal bulk and tone, no edema Skin: intact no rashes or lesions present   Recent Labs  Lab 09/22/19 0719  NA 136  K 5.1  CL 101  CO2 21*  BUN 31*  CREATININE 1.44*  GLUCOSE 413*   Recent Labs  Lab 09/22/19 0719  HGB 14.9  HCT 44.4  WBC 12.1*  PLT 342   Dg Chest 2 View  Result Date: 09/22/2019 CLINICAL DATA:  Shortness of breath, cough. EXAM: CHEST - 2 VIEW COMPARISON:  Same day. FINDINGS: Stable cardiomegaly. Status post coronary bypass graft. No pneumothorax is noted. Right-sided pacemaker is unchanged in position. Stable diffuse interstitial densities are noted throughout both lungs concerning for pulmonary edema with probable small bilateral pleural effusions. Bony thorax is unremarkable. IMPRESSION: Stable cardiomegaly with bilateral pulmonary edema and probable small bilateral pleural effusions. Electronically Signed   By: Marijo Conception M.D.   On: 09/22/2019 13:35   Dg Chest Portable 1 View  Result Date: 09/22/2019 CLINICAL DATA:  Admitted to [facility] for shortness of breath, initially weaned off BiPAP, now worsening EXAM: PORTABLE CHEST 1 VIEW  COMPARISON:  Radiograph 09/22/2019 FINDINGS: Cardiac pacer pack overlies the right chest wall with leads at the right atrium, coronary sinus and cardiac apex. Postsurgical changes related to prior CABG including intact and aligned sternotomy wires and multiple surgical clips projecting over the mediastinum. Distal left clavicular plate screw fixation hardware is noted. Degenerative changes are present in the imaged spine and shoulders. Worsening bilateral interstitial and airspace opacities most pronounced in the right lung. Associated septal thickening is present with central vascular cuffing. Mild cardiomegaly, similar to prior accounting for differences in technique. No pneumothorax. No visible effusion. IMPRESSION: Worsening bilateral interstitial and airspace opacities, most pronounced in the right lung, consistent with worsening pulmonary edema. Superimposed infection cannot be excluded. Electronically Signed   By: Lovena Le M.D.   On: 09/22/2019 18:16   Dg Chest Portable 1 View  Result Date: 09/22/2019 CLINICAL DATA:  Shortness of breath EXAM: PORTABLE CHEST 1 VIEW COMPARISON:  August 25, 2019 FINDINGS: There is cardiomegaly with pulmonary venous hypertension. There is generalized interstitial edema. There is atelectatic change in the right base and right upper lobe. There is no airspace consolidation. Pacemaker leads are attached to the right atrium, right ventricle, and coronary sinus. Patient is status post coronary artery bypass grafting. No adenopathy. There is aortic atherosclerosis. There is postoperative change in the left clavicle. IMPRESSION: Cardiomegaly  with pulmonary venous hypertension. There is interstitial pulmonary edema. The appearance is felt to be indicative of a degree of congestive heart failure. Areas of atelectatic change on the right. No consolidation. Pacemaker leads attached to right atrium, right ventricle, and coronary sinus. Status post coronary artery bypass grafting.  Aortic Atherosclerosis (ICD10-I70.0). Electronically Signed   By: Lowella Grip III M.D.   On: 09/22/2019 07:49    ASSESSMENT / PLAN:  ASSESSMENT / PLAN:  Acute on chronic hypoxic respiratory failure secondary to acute on chronic CHF exacerbation, AECOPD, and moderate to severe pulmonary hypertension   Prn Bipap for dyspnea and/or hypoxia  Continue scheduled and prn bronchodilator therapy IV steroids wean as tolerated  Per cardiology pt scheduled for outpatient cardiac MRI at [facility] to further delineate mitral valve disease and pulmonary hypertension   Acute on chronic CHF exacerbation  Elevated troponin likely secondary to demand ischemia in setting of respiratory failure  Hypertension  Hx: AICD, Mitral Valve Disorder, CAD, and Bronchiolitis-Interstitial Lung Disease  Continuous telemetry monitoring  Trend troponin's Continue iv lasix  Will discontinue nitroglycerin gtt  Continue outpatient aspirin, atorvastatin, carvedilol, clopidogrel, and entresto Cardiology consulted appreciate input   Acute on chronic renal failure  Lactic acidosis  Trend BMP  Replace electrolytes as indicated  Monitor UOP Avoid nephrotoxic mediations   Leukocytosis although no obvious signs of infection  Trend WBC and monitor fever curve  Follow cultures  Will not start abx unless pt becomes febrile   Hyperglycemia likely secondary to steroids  CBG's ac/hs  SSI   GERD  Continue protonix   Depression  Continue outpatient lexapro  VTE px: subq heparin    Marda Stalker, [place] Pager [phone] (please enter 7 digits) PCCM Consult Pager [phone] (please enter 7 digits)

## 2019-09-22 NOTE — ED Notes (Signed)
Patient's oxygen saturation remains in high 90s on 4L [place]. Oxygen decreased to 2L. Saturation remains in high 90s. Will continue to monitor for hypoxia and increased work of breathing.

## 2019-09-22 NOTE — ED Notes (Signed)
Unit unable to accept pt at this time. Charge RN of unit will call.

## 2019-09-22 NOTE — ED Notes (Signed)
ED TO INPATIENT HANDOFF REPORT  ED Nurse Name and Phone #: Yetta Flock, [place] Name/Age/Gender [name] 68 y.o. female Room/Bed: ED05A/ED05A  Code Status   Code Status: Full Code  Home/SNF/Other Home Patient oriented to: self, place, time and situation Is this baseline? Yes   Triage Complete: Triage complete  Chief Complaint sob ems  Triage Note Patient from home via [facility]. Patient reports worsening SOB this morning. Hx COPD and CHF. Patient given 4 albuterol treatments and 1 duoneb treatment by EMS. Patient with room air sat in 80's  Per EMS. Patient arrives on CPAP, in tripod position with accessory muscle use.    Allergies Allergies  Allergen Reactions  . Vancomycin Nausea And Vomiting and Palpitations  . Hydrocodone-Acetaminophen Nausea Only    Level of Care/Admitting Diagnosis ED Disposition    ED Disposition Condition [facility] Area: [place] [[id number]]  Level of Care: Stepdown [14]  Covid Evaluation: Person Under Investigation (PUI)  Diagnosis: Respiratory failure with hypoxia [facility]) [[id number]]  Admitting Physician: Cherylann Ratel  Attending Physician: Cherylann Ratel  Estimated length of stay: 3 - 4 days  Certification:: I certify this patient will need inpatient services for at least 2 midnights  PT Class (Do Not Modify): Inpatient [101]  PT Acc Code (Do Not Modify): Private [1]       B Medical/Surgery History Past Medical History:  Diagnosis Date  . AICD (automatic cardioverter/defibrillator) present    on right side  . Anemia   . CAD (coronary artery disease)    s/p CABG  . CHF (congestive heart failure) ([place])   . Chronic kidney disease    renal artery stenosis  . Colon cancer ([place])   . Depression   . GERD (gastroesophageal reflux disease)   . Hx of colonic polyps   . Hyperlipidemia   . Hypertension   . Mitral valve disorder    s/p mitral valve repair wth CABG  . Myocardial  infarction ([place])   . Peripheral vascular disease ([place])   . Presence of permanent cardiac pacemaker    Pacemaker/ Defibrillator  . Tobacco abuse 10/11/2014   Past Surgical History:  Procedure Laterality Date  . arm surgery     fracture, has plates and screws  . CABG with mitral valve repair    . CARDIAC CATHETERIZATION    . COLON SURGERY     colon cancer  . COLONOSCOPY WITH PROPOFOL N/A 10/09/2016   Procedure: COLONOSCOPY WITH PROPOFOL;  Surgeon: Jonathon Bellows, MD;  Location: [facility] ENDOSCOPY;  Service: Endoscopy;  Laterality: N/A;  . COLONOSCOPY WITH PROPOFOL N/A 07/24/2018   Procedure: COLONOSCOPY WITH PROPOFOL;  Surgeon: Virgel Manifold, MD;  Location: [facility] ENDOSCOPY;  Service: Endoscopy;  Laterality: N/A;  . CORONARY ARTERY BYPASS GRAFT    . ENTEROSCOPY N/A 11/21/2018   Procedure: ENTEROSCOPY-BALLOON;  Surgeon: Jonathon Bellows, MD;  Location: [place] ENDOSCOPY;  Service: Gastroenterology;  Laterality: N/A;  . ESOPHAGOGASTRODUODENOSCOPY (EGD) WITH PROPOFOL N/A 07/24/2018   Procedure: ESOPHAGOGASTRODUODENOSCOPY (EGD) WITH PROPOFOL;  Surgeon: Virgel Manifold, MD;  Location: [facility] ENDOSCOPY;  Service: Endoscopy;  Laterality: N/A;  . GIVENS CAPSULE STUDY N/A 08/16/2018   Procedure: GIVENS CAPSULE STUDY;  Surgeon: Virgel Manifold, MD;  Location: [facility] ENDOSCOPY;  Service: Endoscopy;  Laterality: N/A;  . pace maker defib  2016  . PERIPHERAL VASCULAR CATHETERIZATION N/A 10/23/2016   Procedure: Renal Angiography;  Surgeon: Algernon Huxley, MD;  Location: [place] [facility] LAB;  Service: Cardiovascular;  Laterality: N/A;  . RIGHT/LEFT HEART CATH AND CORONARY ANGIOGRAPHY N/A 08/26/2019   Procedure: RIGHT/LEFT HEART CATH AND CORONARY ANGIOGRAPHY;  Surgeon: Isaias Cowman, MD;  Location: [place] [facility];  Service: Cardiovascular;  Laterality: N/A;  . TOTAL ABDOMINAL HYSTERECTOMY  1999   history of abnormal pap     A IV Location/Drains/Wounds Patient Lines/Drains/Airways Status   Active  Line/Drains/Airways    Name:   Placement date:   Placement time:   Site:   Days:   Peripheral IV 09/22/19 Right Antecubital   09/22/19    0730    Antecubital   less than 1   Peripheral IV 09/22/19 Right Hand   09/22/19    0730    Hand   less than 1   External Urinary Catheter   09/22/19    0930    -   less than 1          Intake/Output Last 24 hours  Intake/Output Summary (Last 24 hours) at 09/22/2019 1804 Last data filed at 09/22/2019 1660 Gross per 24 hour  Intake 153.35 ml  Output -  Net 153.35 ml    Labs/Imaging Results for orders placed or performed during the hospital encounter of 09/22/19 (from the past 48 hour(s))  Blood gas, venous     Status: Abnormal   Collection Time: 09/22/19  7:19 AM  Result Value Ref Range   pH, Ven 7.31 7.250 - 7.430   pCO2, Ven 45 44.0 - 60.0 mmHg   pO2, Ven 81.0 (H) 32.0 - 45.0 mmHg   Bicarbonate 22.7 20.0 - 28.0 mmol/L   Acid-base deficit 3.7 (H) 0.0 - 2.0 mmol/L   O2 Saturation 94.7 %   Patient temperature 37.0    Collection site VEIN    Sample type VEIN     Comment: Performed at [facility], [address] [place], [place] [address]  Basic metabolic panel     Status: Abnormal   Collection Time: 09/22/19  7:19 AM  Result Value Ref Range   Sodium 136 135 - 145 mmol/L   Potassium 5.1 3.5 - 5.1 mmol/L   Chloride 101 98 - 111 mmol/L   CO2 21 (L) 22 - 32 mmol/L   Glucose, Bld 413 (H) 70 - 99 mg/dL   BUN 31 (H) 8 - 23 mg/dL   Creatinine, Ser 1.44 (H) 0.44 - 1.00 mg/dL   Calcium 9.2 8.9 - 10.3 mg/dL   GFR calc non Af Amer 38 (L) >60 mL/min   GFR calc Af Amer 44 (L) >60 mL/min   Anion gap 14 5 - 15    Comment: Performed at [facility], [place], [place] [address]  Troponin I (High Sensitivity)     Status: Abnormal   Collection Time: 09/22/19  7:19 AM  Result Value Ref Range   Troponin I (High Sensitivity) 19 (H) <18 ng/L    Comment: (NOTE) Elevated high sensitivity troponin I (hsTnI) values and  significant  changes across serial measurements may suggest ACS but many other  chronic and acute conditions are known to elevate hsTnI results.  Refer to the "Links" section for chest pain algorithms and additional  guidance. Performed at [facility], [address] [place], [place] [address]   CBC with Differential     Status: Abnormal   Collection Time: 09/22/19  7:19 AM  Result Value Ref Range   WBC 12.1 (H) 4.0 - 10.5 K/uL   RBC 4.72 3.87 -  5.11 MIL/uL   Hemoglobin 14.9 12.0 - 15.0 g/dL   HCT 44.4 36.0 - 46.0 %   MCV 94.1 80.0 - 100.0 fL   MCH 31.6 26.0 - 34.0 pg   MCHC 33.6 30.0 - 36.0 g/dL   RDW 15.2 11.5 - 15.5 %   Platelets 342 150 - 400 K/uL   nRBC 0.0 0.0 - 0.2 %   Neutrophils Relative % 65 %   Neutro Abs 8.0 (H) 1.7 - 7.7 K/uL   Lymphocytes Relative 21 %   Lymphs Abs 2.5 0.7 - 4.0 K/uL   Monocytes Relative 8 %   Monocytes Absolute 1.0 0.1 - 1.0 K/uL   Eosinophils Relative 3 %   Eosinophils Absolute 0.3 0.0 - 0.5 K/uL   Basophils Relative 2 %   Basophils Absolute 0.2 (H) 0.0 - 0.1 K/uL   Immature Granulocytes 1 %   Abs Immature Granulocytes 0.10 (H) 0.00 - 0.07 K/uL    Comment: Performed at [facility], [address] [place],  [address]  Procalcitonin - Baseline     Status: None   Collection Time: 09/22/19  7:19 AM  Result Value Ref Range   Procalcitonin <0.10 ng/mL    Comment:        Interpretation: PCT (Procalcitonin) <= 0.5 ng/mL: Systemic infection (sepsis) is not likely. Local bacterial infection is possible. (NOTE)       Sepsis PCT Algorithm           Lower Respiratory Tract                                      Infection PCT Algorithm    ----------------------------     ----------------------------         PCT < 0.25 ng/mL                PCT < 0.10 ng/mL         Strongly encourage             Strongly discourage   discontinuation of antibiotics    initiation of antibiotics    ----------------------------      -----------------------------       PCT 0.25 - 0.50 ng/mL            PCT 0.10 - 0.25 ng/mL               OR       >80% decrease in PCT            Discourage initiation of                                            antibiotics      Encourage discontinuation           of antibiotics    ----------------------------     -----------------------------         PCT >= 0.50 ng/mL              PCT 0.26 - 0.50 ng/mL               AND        <80% decrease in PCT             Encourage initiation of  antibiotics       Encourage continuation           of antibiotics    ----------------------------     -----------------------------        PCT >= 0.50 ng/mL                  PCT > 0.50 ng/mL               AND         increase in PCT                  Strongly encourage                                      initiation of antibiotics    Strongly encourage escalation           of antibiotics                                     -----------------------------                                           PCT <= 0.25 ng/mL                                                 OR                                        > 80% decrease in PCT                                     Discontinue / Do not initiate                                             antibiotics Performed at [facility], [address] [place], [place] [address]   Brain natriuretic peptide     Status: Abnormal   Collection Time: 09/22/19  7:20 AM  Result Value Ref Range   B Natriuretic Peptide 1,885.0 (H) 0.0 - 100.0 pg/mL    Comment: Performed at [facility], [place], [place] [address]  SARS CORONAVIRUS 2 (TAT 6-24 HRS) Nasopharyngeal Nasopharyngeal Swab     Status: None   Collection Time: 09/22/19  7:20 AM   Specimen: Nasopharyngeal Swab  Result Value Ref Range   SARS Coronavirus 2 NEGATIVE NEGATIVE    Comment: (NOTE) SARS-CoV-2 target nucleic acids are NOT  DETECTED. The SARS-CoV-2 RNA is generally detectable in upper and lower respiratory specimens during the acute phase of infection. Negative results do not preclude SARS-CoV-2 infection, do not rule out co-infections with other pathogens, and should not be used as the sole basis for treatment or other patient management decisions. Negative results must be combined with clinical  observations, patient history, and epidemiological information. The expected result is Negative. Fact Sheet for Patients: SugarRoll.be Fact Sheet for Healthcare Providers: https://www.woods-mathews.com/ This test is not yet approved or cleared by the [place] FDA and  has been authorized for detection and/or diagnosis of SARS-CoV-2 by FDA under an Emergency Use Authorization (EUA). This EUA will remain  in effect (meaning this test can be used) for the duration of the COVID-19 declaration under Section 56 4(b)(1) of the Act, 21 U.S.C. section 360bbb-3(b)(1), unless the authorization is terminated or revoked sooner. Performed at [facility], [place] [address], [place], [place] [address]   Influenza panel by PCR (type A & B)     Status: None   Collection Time: 09/22/19  7:20 AM  Result Value Ref Range   Influenza A By PCR NEGATIVE NEGATIVE   Influenza B By PCR NEGATIVE NEGATIVE    Comment: (NOTE) The Xpert Xpress Flu assay is intended as an aid in the diagnosis of  influenza and should not be used as a sole basis for treatment.  This  assay is FDA approved for nasopharyngeal swab specimens only. Nasal  washings and aspirates are unacceptable for Xpert Xpress Flu testing. Performed at [facility], [place], [place] [address]   Troponin I (High Sensitivity)     Status: Abnormal   Collection Time: 09/22/19  9:22 AM  Result Value Ref Range   Troponin I (High Sensitivity) 19 (H) <18 ng/L    Comment: (NOTE) Elevated high sensitivity  troponin I (hsTnI) values and significant  changes across serial measurements may suggest ACS but many other  chronic and acute conditions are known to elevate hsTnI results.  Refer to the "Links" section for chest pain algorithms and additional  guidance. Performed at [facility], [address] [place], [place] [address]   Type and screen     Status: None   Collection Time: 09/22/19  3:32 PM  Result Value Ref Range   ABO/RH(D) B POS    Antibody Screen NEG    Sample Expiration      09/25/2019,2359 Performed at [facility], [address] [place], [place] [address]   T4, free     Status: None   Collection Time: 09/22/19  3:35 PM  Result Value Ref Range   Free T4 0.91 0.61 - 1.12 ng/dL    Comment: (NOTE) Biotin ingestion may interfere with free T4 tests. If the results are inconsistent with the TSH level, previous test results, or the clinical presentation, then consider biotin interference. If needed, order repeat testing after stopping biotin. Performed at [facility], [address] [place], [place] [address]   Blood gas, arterial ([facility] & [facility] ONLY)     Status: Abnormal   Collection Time: 09/22/19  5:25 PM  Result Value Ref Range   FIO2 0.28    Delivery systems NASAL CANNULA    pH, Arterial 7.42 7.350 - 7.450   pCO2 arterial 30 (L) 32.0 - 48.0 mmHg   pO2, Arterial 81 (L) 83.0 - 108.0 mmHg   Bicarbonate 19.5 (L) 20.0 - 28.0 mmol/L   Acid-base deficit 3.9 (H) 0.0 - 2.0 mmol/L   O2 Saturation 96.1 %   Patient temperature 37.0    Collection site RIGHT RADIAL    Sample type ARTERIAL DRAW    Allens test (pass/fail) POSITIVE (A) PASS    Comment: Performed at [facility], [address] [place], [place] [address]   Dg  Chest 2 View  Result Date: 09/22/2019 CLINICAL DATA:  Shortness of breath, cough. EXAM: CHEST - 2 VIEW COMPARISON:  Same day. FINDINGS: Stable cardiomegaly. Status post coronary bypass graft. No pneumothorax is  noted. Right-sided pacemaker is unchanged in position. Stable diffuse interstitial densities are noted throughout both lungs concerning for pulmonary edema with probable small bilateral pleural effusions. Bony thorax is unremarkable. IMPRESSION: Stable cardiomegaly with bilateral pulmonary edema and probable small bilateral pleural effusions. Electronically Signed   By: Marijo Conception M.D.   On: 09/22/2019 13:35   Dg Chest Portable 1 View  Result Date: 09/22/2019 CLINICAL DATA:  Shortness of breath EXAM: PORTABLE CHEST 1 VIEW COMPARISON:  August 25, 2019 FINDINGS: There is cardiomegaly with pulmonary venous hypertension. There is generalized interstitial edema. There is atelectatic change in the right base and right upper lobe. There is no airspace consolidation. Pacemaker leads are attached to the right atrium, right ventricle, and coronary sinus. Patient is status post coronary artery bypass grafting. No adenopathy. There is aortic atherosclerosis. There is postoperative change in the left clavicle. IMPRESSION: Cardiomegaly with pulmonary venous hypertension. There is interstitial pulmonary edema. The appearance is felt to be indicative of a degree of congestive heart failure. Areas of atelectatic change on the right. No consolidation. Pacemaker leads attached to right atrium, right ventricle, and coronary sinus. Status post coronary artery bypass grafting. Aortic Atherosclerosis (ICD10-I70.0). Electronically Signed   By: Lowella Grip III M.D.   On: 09/22/2019 07:49    Pending Labs Unresulted Labs (From admission, onward)    Start     Ordered   09/23/19 0500  Comprehensive metabolic panel  Tomorrow morning,   STAT     09/22/19 1140   09/23/19 0500  Vitamin B12  (Anemia Panel (PNL))  Tomorrow morning,   STAT     09/22/19 1247   09/23/19 0500  Folate  (Anemia Panel (PNL))  Tomorrow morning,   STAT     09/22/19 1247   09/23/19 0500  Iron and TIBC  (Anemia Panel (PNL))  Tomorrow morning,   STAT      09/22/19 1247   09/23/19 0500  Ferritin  (Anemia Panel (PNL))  Tomorrow morning,   STAT     09/22/19 1247   09/23/19 0500  Reticulocytes  (Anemia Panel (PNL))  Tomorrow morning,   STAT     09/22/19 1247   09/23/19 0500  TSH  Tomorrow morning,   STAT     09/22/19 1247   09/22/19 1247  Lyme disease, western blot  Add-on,   AD     09/22/19 1247   09/22/19 1247  ANA w/Reflex if Positive  Once,   STAT     09/22/19 1247   09/22/19 1246  Occult blood card to lab, stool RN will collect  Once,   STAT    Question:  Specimen to be collected by:  Answer:  RN will collect   09/22/19 1247          Vitals/Pain Today's Vitals   09/22/19 1300 09/22/19 1400 09/22/19 1500 09/22/19 1600  BP: (!) 151/103 (!) 166/99 (!) 168/113 (!) 154/100  Pulse: (!) 114 (!) 113 (!) 112 (!) 115  Resp: (!) 29 (!) 27 15 19   Temp:      TempSrc:      SpO2: 99% 99% 99% 99%  Weight:      Height:      PainSc:        Isolation Precautions No active isolations  Medications  Medications  heparin injection 5,000 Units (5,000 Units Subcutaneous Given 09/22/19 1521)  sodium chloride flush (NS) 0.9 % injection 3 mL (3 mLs Intravenous Given 09/22/19 1212)  aspirin EC tablet 81 mg (81 mg Oral Given 09/22/19 1152)  atorvastatin (LIPITOR) tablet 40 mg (has no administration in time range)  carvedilol (COREG) tablet 6.25 mg (6.25 mg Oral Given 09/22/19 1650)  ALPRAZolam (XANAX) tablet 0.25-0.5 mg (has no administration in time range)  escitalopram (LEXAPRO) tablet 10 mg (10 mg Oral Given 09/22/19 1211)  traZODone (DESYREL) tablet 25-50 mg (has no administration in time range)  clopidogrel (PLAVIX) tablet 75 mg (75 mg Oral Given 09/22/19 1211)  ipratropium-albuterol (DUONEB) 0.5-2.5 (3) MG/3ML nebulizer solution 3 mL (3 mLs Nebulization Given 09/22/19 1655)  methylPREDNISolone sodium succinate (SOLU-MEDROL) 40 mg/mL injection 40 mg (40 mg Intravenous Given 09/22/19 1331)  pantoprazole (PROTONIX) injection 40 mg (40 mg  Intravenous Given 09/22/19 1329)  nitroGLYCERIN (NITROSTAT) SL tablet 0.4 mg (0.4 mg Sublingual Given 09/22/19 1746)  nitroGLYCERIN 50 mg in dextrose 5 % 250 mL (0.2 mg/mL) infusion (100 mcg/min Intravenous New Bag/Given 09/22/19 1743)  metoprolol tartrate (LOPRESSOR) 5 MG/5ML injection (has no administration in time range)  nitroGLYCERIN 50 mg in dextrose 5 % 250 mL (0.2 mg/mL) infusion (has no administration in time range)  methylPREDNISolone sodium succinate (SOLU-MEDROL) 125 mg/2 mL injection 125 mg (125 mg Intravenous Given 09/22/19 0731)  magnesium sulfate IVPB 2 g 50 mL (0 g Intravenous Stopped 09/22/19 0803)  cefTRIAXone (ROCEPHIN) 2 g in sodium chloride 0.9 % 100 mL IVPB ( Intravenous Stopped 09/22/19 0851)  azithromycin (ZITHROMAX) 500 mg in sodium chloride 0.9 % 250 mL IVPB (0 mg Intravenous Stopped 09/22/19 0927)  furosemide (LASIX) injection 40 mg (40 mg Intravenous Given 09/22/19 0927)  furosemide (LASIX) injection 40 mg (40 mg Intravenous Given 09/22/19 1718)  morphine 2 MG/ML injection 2 mg (2 mg Intravenous Given 09/22/19 1735)  metoprolol tartrate (LOPRESSOR) injection 5 mg (5 mg Intravenous Given 09/22/19 1745)  LORazepam (ATIVAN) injection 1 mg (1 mg Intravenous Given 09/22/19 1745)    Mobility walks Low fall risk   Focused Assessments N/A   R Recommendations: See Admitting Provider Note  Report given to:   Additional Notes:

## 2019-09-22 NOTE — Progress Notes (Signed)
eLink Physician-Brief Progress Note Patient Name: Ashley [name] DOB: [birth year] MRN: [id number]   Date of Service  09/22/2019  HPI/Events of Note  Pt admitted with acute exacerbation of COPD, CHF, and pulmonary hypertension.  eICU Interventions  New patient evaluation completed.        [name] 09/22/2019, 9:54 PM

## 2019-09-22 NOTE — H&P (Signed)
History and Physical    Ashley [name] OMV:[id number] DOB: 1951/03/18 DOA: 09/22/2019  PCP: [name], MD (Confirm with patient/family/NH records and if not entered, this has to be entered at [facility] point of entry) Patient coming from: Home  I have personally briefly reviewed patient's old medical records in [place]  Chief Complaint: Acute respiratory failure with hypoxia.  HPI: Ashley [name] is a 68 y.o. female with medical history significant of heart disease, COPD, congestive heart failure with an ejection fraction of 40 to 45% status post ICD in March 2017.  Patient seen in the emergency room today for acute onset of shortness of breath started this morning after she woke up.  Patient denies any high sodium diet.  Patient also denies any recent trips or travel.  He was seen by her cardiologist in October 2020. Patient has an extensive cardiac history with a history of NSTEMI in 2007 status post CABG, history of congestive systolic heart failure with reduced ejection fraction of 40 to 45% status post biventricular AICD. Patient's last admission was on August 24, 2019 for diagnosis of acute respiratory failure secondary to flash pulmonary edema in the setting of acute on chronic systolic congestive heart failure.  Today patient states that usually her heart failure presentations are acute in onset has no other associated symptoms except for midsternal chest pain nonradiating.  Patient is having tachypnea and short of breath when speaking.  Patient was on a regimen of Lasix, Entresto, Coreg, Aldactone.  ED Course: In the emergency room patient is alert awake and oriented, tachypneic is able to complete sentences but is getting short of breath while speaking. Blood pressure (!) 163/117, pulse (!) 111, temperature (!) 97.4 F (36.3 C), temperature source Axillary, resp. rate 20, height 5\' 4"  (1.626 m), weight 84.6 kg, SpO2 98 %. Initial vitals as reflected above show blood  pressure is elevated with tachycardia, patient is afebrile and oxygen saturation of 98%.  Patient received 4 albuterol treatments and 1 DuoNeb treatment by EMS when her initial oxygen saturation was 80% and patient was  on CPAP.  Stat labs, venous blood gas, BNP, cardiac enzymes, EKG, chest x-ray, COVID-19 swab, flu swab was ordered.  Patient was also given a Medrol 125 mg x 1, Rocephin 2 g x 1, Theramycin 500 mg x 1, magnesium 2 g IV x1, Lasix 40 mg IV x1.  Patient was taken off BiPAP at 915 and placed on 4 L nasal cannula with sats in the high 90s at that time.  Patient was continued on her Plavix 75, Lexapro 10 mg.pt found to have elevated BNP and pos troponin which is suspect is from demand ischemia. Cardiology consulted.  Review of Systems: As per HPI otherwise 10 point review of systems negative.    Past Medical History:  Diagnosis Date  . AICD (automatic cardioverter/defibrillator) present    on right side  . Anemia   . CAD (coronary artery disease)    s/p CABG  . CHF (congestive heart failure) ([place])   . Chronic kidney disease    renal artery stenosis  . Colon cancer ([place])   . Depression   . GERD (gastroesophageal reflux disease)   . Hx of colonic polyps   . Hyperlipidemia   . Hypertension   . Mitral valve disorder    s/p mitral valve repair wth CABG  . Myocardial infarction ([place])   . Peripheral vascular disease ([place])   . Presence of permanent cardiac pacemaker  Pacemaker/ Defibrillator  . Tobacco abuse 10/11/2014    Past Surgical History:  Procedure Laterality Date  . arm surgery     fracture, has plates and screws  . CABG with mitral valve repair    . CARDIAC CATHETERIZATION    . COLON SURGERY     colon cancer  . COLONOSCOPY WITH PROPOFOL N/A 10/09/2016   Procedure: COLONOSCOPY WITH PROPOFOL;  Surgeon: Jonathon Bellows, MD;  Location: [facility] ENDOSCOPY;  Service: Endoscopy;  Laterality: N/A;  . COLONOSCOPY WITH PROPOFOL N/A 07/24/2018   Procedure: COLONOSCOPY WITH PROPOFOL;   Surgeon: Virgel Manifold, MD;  Location: [facility] ENDOSCOPY;  Service: Endoscopy;  Laterality: N/A;  . CORONARY ARTERY BYPASS GRAFT    . ENTEROSCOPY N/A 11/21/2018   Procedure: ENTEROSCOPY-BALLOON;  Surgeon: Jonathon Bellows, MD;  Location: [facility] ENDOSCOPY;  Service: Gastroenterology;  Laterality: N/A;  . ESOPHAGOGASTRODUODENOSCOPY (EGD) WITH PROPOFOL N/A 07/24/2018   Procedure: ESOPHAGOGASTRODUODENOSCOPY (EGD) WITH PROPOFOL;  Surgeon: Virgel Manifold, MD;  Location: [facility] ENDOSCOPY;  Service: Endoscopy;  Laterality: N/A;  . GIVENS CAPSULE STUDY N/A 08/16/2018   Procedure: GIVENS CAPSULE STUDY;  Surgeon: Virgel Manifold, MD;  Location: [facility] ENDOSCOPY;  Service: Endoscopy;  Laterality: N/A;  . pace maker defib  2016  . PERIPHERAL VASCULAR CATHETERIZATION N/A 10/23/2016   Procedure: Renal Angiography;  Surgeon: Algernon Huxley, MD;  Location: [place] [facility];  Service: Cardiovascular;  Laterality: N/A;  . RIGHT/LEFT HEART CATH AND CORONARY ANGIOGRAPHY N/A 08/26/2019   Procedure: RIGHT/LEFT HEART CATH AND CORONARY ANGIOGRAPHY;  Surgeon: Isaias Cowman, MD;  Location: [place] [facility];  Service: Cardiovascular;  Laterality: N/A;  . TOTAL ABDOMINAL HYSTERECTOMY  1999   history of abnormal pap     reports that she quit smoking about 3 months ago. Her smoking use included cigarettes. She smoked 0.25 packs per day. She has never used smokeless tobacco. She reports current alcohol use of about 2.0 standard drinks of alcohol per week. She reports that she does not use drugs.  Allergies  Allergen Reactions  . Vancomycin Nausea And Vomiting and Palpitations  . Hydrocodone-Acetaminophen Nausea Only    Family History  Problem Relation Age of Onset  . Arthritis Mother   . Cancer Mother        uterus cancer  . Hyperlipidemia Mother   . Hypertension Mother   . Heart disease Mother   . Diabetes Mother   . Hyperlipidemia Father   . Hypertension Father   . Heart disease Father   .  Diabetes Father   . Cancer Sister        ovary cancer  . Diabetes Maternal Grandmother   . Hypertension Maternal Grandmother   . Arthritis Maternal Grandmother   . Hypertension Maternal Grandfather   . Hypertension Paternal Grandmother   . Hypertension Paternal Grandfather   . Heart disease Paternal Grandfather   . Heart disease Brother   . Kidney disease Brother   . Breast cancer Neg Hx      Prior to Admission medications   Medication Sig Start Date End Date Taking? Authorizing Provider  albuterol (VENTOLIN HFA) 108 (90 Base) MCG/ACT inhaler INHALE 2 PUFFS BY MOUTH INTO THE LUNGS EVERY 6 HOURS AS NEEDED FOR WHEEZING Patient taking differently: Inhale 2 puffs into the lungs every 6 (six) hours as needed for wheezing.  09/04/19  Yes Tyler Pita, MD  ALPRAZolam Duanne Moron) 0.5 MG tablet Take 0.5-1 tablets (0.25-0.5 mg total) by mouth at bedtime. TAKE ONE TABLET BY MOUTH AT BEDTIME AS  NEEDED FOR ANXIETY Patient taking differently: Take 0.25-0.5 mg by mouth at bedtime.  09/05/19  Yes [name], MD  acetaminophen (TYLENOL) 500 MG tablet Maximum 6 tablets a day. Patient not taking: Reported on 09/04/2019 02/09/15   Rubbie Battiest, RN  aspirin 81 MG tablet Take 81 mg by mouth daily.    [provider]  atorvastatin (LIPITOR) 40 MG tablet TAKE ONE TABLET BY MOUTH ONCE DAILY 07/25/19   [name], MD  carvedilol (COREG) 6.25 MG tablet Take 1 tablet (6.25 mg total) by mouth 2 (two) times daily with a meal. 09/05/19   [name], MD  clopidogrel (PLAVIX) 75 MG tablet Take 1 tablet (75 mg total) by mouth daily. 09/05/19   Alisa Graff, FNP  cyanocobalamin (,VITAMIN B-12,) 1000 MCG/ML injection Inject 1 mL into the muscle monthly 09/05/19   [name], MD  diphenhydrAMINE (DIPHENHIST) 25 mg capsule Take 25 mg by mouth at bedtime as needed for allergies or sleep.     [provider]  escitalopram (LEXAPRO) 10 MG tablet Take 1 tablet (10 mg total) by mouth  daily. 09/05/19   [name], MD  ferrous sulfate 325 (65 FE) MG EC tablet Take 325 mg by mouth daily with breakfast.    [provider]  Fluticasone-Umeclidin-Vilant (TRELEGY ELLIPTA) 100-62.5-25 MCG/INH AEPB Inhale 1 puff into the lungs daily. 09/04/19   Tyler Pita, MD  furosemide (LASIX) 40 MG tablet Take 1 tablet (40 mg total) by mouth 3 (three) times daily. 09/05/19   Darylene Price A, FNP  ipratropium-albuterol (DUONEB) 0.5-2.5 (3) MG/3ML SOLN Take 3 mLs by nebulization every 6 (six) hours as needed. Patient not taking: Reported on 09/04/2019 08/05/18   [name], MD  nitroGLYCERIN (NITROSTAT) 0.4 MG SL tablet Place 1 tablet (0.4 mg total) under the tongue every 5 (five) minutes as needed for chest pain. Patient not taking: Reported on 09/04/2019 09/03/19   [name], MD  omeprazole (PRILOSEC) 40 MG capsule Take 1 capsule (40 mg total) by mouth daily. 09/05/19   [name], MD  potassium chloride (KLOR-CON) 10 MEQ tablet Take 1 tablet (10 mEq total) by mouth daily. 09/05/19   [name], MD  sacubitril-valsartan (ENTRESTO) 24-26 MG Take 1 tablet by mouth 2 (two) times daily. 09/05/19   Alisa Graff, FNP  spironolactone (ALDACTONE) 25 MG tablet Take 0.5 tablets (12.5 mg total) by mouth daily. 09/05/19 09/04/20  Alisa Graff, FNP  traZODone (DESYREL) 50 MG tablet Take 0.5-1 tablets (25-50 mg total) by mouth daily. One hour before bedtime 09/05/19   [name], MD    Physical Exam: Vitals:   09/22/19 0920 09/22/19 1000 09/22/19 1100 09/22/19 1200  BP: (!) 115/93 (!) 169/105 (!) 163/117 (!) 169/106  Pulse: (!) 101 (!) 104 (!) 111 (!) 114  Resp: (!) 22 (!) 21 20 (!) 23  Temp:      TempSrc:      SpO2: 99% 98% 98% 97%  Weight:      Height:        Constitutional: NAD, calm, comfortable Vitals:   09/22/19 0920 09/22/19 1000 09/22/19 1100 09/22/19 1200  BP: (!) 115/93 (!) 169/105 (!) 163/117 (!) 169/106  Pulse: (!) 101 (!) 104 (!) 111  (!) 114  Resp: (!) 22 (!) 21 20 (!) 23  Temp:      TempSrc:      SpO2: 99% 98% 98% 97%  Weight:  Height:       General: A/A/O Tachypnea / [place] 4 L. Eyes: PERRL,EOMI , lids and conjunctivae normal ENMT: Mucous membranes are moist. Posterior pharynx clear of any exudate or lesions.Normal dentition.  Neck: normal, supple, no masses, no thyromegaly Respiratory: Bilateral basilar crackles posteriorly, scattered wheezing inspiratory and expiratory normal respiratory effort. No accessory muscle use.  Cardiovascular: Regular rate and rhythm, no murmurs / rubs / gallops. 1+ extremity edema. 2+ pedal pulses. No carotid bruits.  Abdomen: no tenderness, no masses palpated. No hepatosplenomegaly. Bowel sounds positive.  Musculoskeletal: no clubbing / cyanosis. No joint deformity upper and lower extremities. Good ROM, no contractures. Normal muscle tone.  Skin: no rashes, lesions, ulcers. No induration Neurologic: CN 2-12 grossly intact. Psychiatric: Normal judgment and insight. Alert and oriented x 3. Normal mood.   Labs on Admission: I have personally reviewed following labs and imaging studies    CBC: Recent Labs  Lab 10/08/19 0719  WBC 12.1*  NEUTROABS 8.0*  HGB 14.9  HCT 44.4  MCV 94.1  PLT 696   Basic Metabolic Panel: Recent Labs  Lab 10-08-19 0719  NA 136  K 5.1  CL 101  CO2 21*  GLUCOSE 413*  BUN 31*  CREATININE 1.44*  CALCIUM 9.2   GFR: Estimated Creatinine Clearance: 40.5 mL/min (A) (by C-G formula based on SCr of 1.44 mg/dL (H)). Liver Function Tests: No results for input(s): AST, ALT, ALKPHOS, BILITOT, PROT, ALBUMIN in the last 168 hours. No results for input(s): LIPASE, AMYLASE in the last 168 hours. No results for input(s): AMMONIA in the last 168 hours. Coagulation Profile: No results for input(s): INR, PROTIME in the last 168 hours. Cardiac Enzymes: No results for input(s): CKTOTAL, CKMB, CKMBINDEX, TROPONINI in the last 168 hours. BNP (last 3 results)  No results for input(s): PROBNP in the last 8760 hours. HbA1C: No results for input(s): HGBA1C in the last 72 hours. CBG: No results for input(s): GLUCAP in the last 168 hours. Lipid Profile: No results for input(s): CHOL, HDL, LDLCALC, TRIG, CHOLHDL, LDLDIRECT in the last 72 hours. Thyroid Function Tests: No results for input(s): TSH, T4TOTAL, FREET4, T3FREE, THYROIDAB in the last 72 hours. Anemia Panel: No results for input(s): VITAMINB12, FOLATE, FERRITIN, TIBC, IRON, RETICCTPCT in the last 72 hours. Urine analysis:    Component Value Date/Time   COLORURINE YELLOW (A) 05/29/2019 0810   APPEARANCEUR CLEAR (A) 05/29/2019 0810   APPEARANCEUR Clear 08/19/2014 0025   LABSPEC 1.006 05/29/2019 0810   LABSPEC 1.009 08/19/2014 0025   PHURINE 6.0 05/29/2019 0810   GLUCOSEU NEGATIVE 05/29/2019 0810   GLUCOSEU 50 mg/dL 08/19/2014 0025   HGBUR SMALL (A) 05/29/2019 0810   BILIRUBINUR NEGATIVE 05/29/2019 0810   BILIRUBINUR Negative 08/19/2014 0025   KETONESUR NEGATIVE 08/24/2019 1442   PROTEINUR NEGATIVE 05/29/2019 0810   NITRITE NEGATIVE 05/29/2019 0810   LEUKOCYTESUR NEGATIVE 05/29/2019 0810   LEUKOCYTESUR Negative 08/19/2014 0025    Radiological Exams on Admission: Dg Chest Portable 1 View  Result Date: 10-08-19 CLINICAL DATA:  Shortness of breath EXAM: PORTABLE CHEST 1 VIEW COMPARISON:  August 25, 2019 FINDINGS: There is cardiomegaly with pulmonary venous hypertension. There is generalized interstitial edema. There is atelectatic change in the right base and right upper lobe. There is no airspace consolidation. Pacemaker leads are attached to the right atrium, right ventricle, and coronary sinus. Patient is status post coronary artery bypass grafting. No adenopathy. There is aortic atherosclerosis. There is postoperative change in the left clavicle. IMPRESSION: Cardiomegaly with pulmonary venous hypertension.  There is interstitial pulmonary edema. The appearance is felt to be  indicative of a degree of congestive heart failure. Areas of atelectatic change on the right. No consolidation. Pacemaker leads attached to right atrium, right ventricle, and coronary sinus. Status post coronary artery bypass grafting. Aortic Atherosclerosis (ICD10-I70.0). Electronically Signed   By: Lowella Grip III M.D.   On: 09/22/2019 07:49    EKG: Independently reviewed. Sinus tach with hr of 108, IVCD, and prolonged QTc of 566.  Assessment/Plan Principal Problem:   Respiratory failure with hypoxia (HCC) Active Problems:   CAD (coronary artery disease)   COPD with emphysema (HCC)   Chronic systolic heart failure (HCC)   Tubular adenoma of colon   Personal history of colon cancer   Prediabetes   HTN (hypertension)   Anemia  Acute hypoxic respiratory failure/ CHF/COPD: Pt to continue oxygen with goal of 92 and above. We will cont and follow serial cardiac enzymes /monitro pt in step down unit .  Cardiology consult. Start IV lasix 40 mg iv q12/ Cont pt on coreg/plavix/asa/atorvastatin. Cont pt on her duoneb inhaler prn. We will also cont her solumedrol.  6 min walk test on d/c for assessment of home oxygen need. Chest xray pending. Will get lyme serology/ thyroid studies/ ana/ for rec chf presentation. Outpatient sleep study referral as well.  Prediabetes with elevated glucose suspect pt has underlying diabetes  That may be contributing to her extensive cardiac history.  We will get hba1c and start therapy.   HTN: Pt continued on her coreg, and will monitor BP closely to allow room for diuretic therapy.   Anemia/ H/O abnormal polyp: We will get stool guaiac and eval, underlying chronics anemia may be contributing to EHR rec episode of  Chf.       DVT prophylaxis: heparin (Lovenox/Heparin/SCD's/anticoagulated/None (if comfort care) Code Status: full (Full/Partial (specify details) Family Communication: None at bedside (Specify name, relationship. Do not write  "discussed with patient". Specify tel # if discussed over the phone) Disposition Plan: Home in 5-7 days (specify when and where you expect patient to be discharged) Consults called: Cardiology Dr. Dorothyann Peng (with names) Admission status: Inpatient(inpatient / obs / tele / medical floor / SDU)   Para Skeans MD [facility] If 7PM-7AM, please contact night-coverage [email or web address] Password [email or web address]  09/22/2019, 12:50 PM

## 2019-09-22 NOTE — ED Notes (Signed)
Attempted to give report to ICU, they state that they are unable to take pt due to nursing shortage.

## 2019-09-22 NOTE — ED Provider Notes (Signed)
[facility] Emergency Department Provider Note  ____________________________________________  Time seen: Approximately 7:26 AM  I have reviewed the triage vital signs and the nursing notes.   HISTORY  Chief Complaint Respiratory Distress    Level 5 Caveat: Portions of the History and Physical including HPI and review of systems are unable to be completely obtained due to acute critical illness/respiratory distress  HPI Ashley [name] is a 68 y.o. female with a history of CAD, CHF, CKD, GERD, COPD who comes the ED complaining of acute onset of shortness of breath at about 3 AM today.  Constant, severe.  No aggravating or alleviating factors.  No cough or fever or body aches.  Denies recent orthopnea or dyspnea on exertion.  Denies chest pain.   Received 5 nebulizer treatments prior to arrival, for albuterol and 1 DuoNeb.  Reportedly had room air oxygen saturation in the low 80s initially by first responders.   Past Medical History:  Diagnosis Date  . AICD (automatic cardioverter/defibrillator) present    on right side  . Anemia   . CAD (coronary artery disease)    s/p CABG  . CHF (congestive heart failure) ([place])   . Chronic kidney disease    renal artery stenosis  . Colon cancer ([place])   . Depression   . GERD (gastroesophageal reflux disease)   . Hx of colonic polyps   . Hyperlipidemia   . Hypertension   . Mitral valve disorder    s/p mitral valve repair wth CABG  . Myocardial infarction ([place])   . Peripheral vascular disease ([place])   . Presence of permanent cardiac pacemaker    Pacemaker/ Defibrillator  . Tobacco abuse 10/11/2014     Patient Active Problem List   Diagnosis Date Noted  . Presence of permanent cardiac pacemaker   . Anemia   . Chronic kidney disease   . Hypertension   . Mitral valve disorder 08/25/2019  . Acute respiratory failure ([place]) 08/24/2019  . Acute CHF ([place]) 08/12/2019  . CHF (congestive heart failure) ([place])  07/16/2019  . Cough with hemoptysis 06/09/2019  . Insomnia 05/07/2019  . Hypokalemia 03/23/2019  . Hyperglycemia, drug-induced 03/23/2019  . COVID-19 virus not detected 03/23/2019  . Respiratory failure ([place]) 03/16/2019  . AVM (arteriovenous malformation) of small bowel, acquired   . Anemia, iron deficiency 11/10/2018  . Chronic systolic heart failure ([place]) 11/06/2018  . Rectal polyp   . Benign neoplasm of cecum   . Barrett's esophagus without dysplasia   . Stomach irritation   . Chronic diastolic heart failure ([place]) 07/03/2018  . HTN (hypertension) 07/03/2018  . 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 B12 deficiency anemia 06/28/2018  . Hypotension 05/11/2018  . Prediabetes 05/11/2018  . Personal history of colon cancer   . Benign neoplasm of descending colon   . Polyp of sigmoid colon   . Benign neoplasm of transverse colon   . Diverticulosis of large intestine without diverticulitis   . 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 Renal artery stenosis ([place]) 10/03/2016  . 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 Hospital discharge follow-up 02/09/2015  . Cough in adult 10/21/2014  . Tobacco abuse counseling 10/11/2014  . Tobacco abuse 10/11/2014  . Chronic right hip pain 08/26/2014  . Atherosclerosis of native artery of extremity with intermittent claudication ([place]) 06/18/2013  . Preoperative evaluation to rule out surgical contraindication 06/18/2013  . CAD (coronary artery disease) 06/01/2013  . GERD (gastroesophageal reflux disease) 06/01/2013  . Hypercholesterolemia 06/01/2013  .  Tubular adenoma of colon 06/01/2013  . 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 arm surgery     fracture, has plates and screws  . CABG with mitral valve repair    . CARDIAC CATHETERIZATION    . COLON SURGERY     colon cancer  . COLONOSCOPY WITH PROPOFOL N/A 10/09/2016   Procedure:  COLONOSCOPY WITH PROPOFOL;  Surgeon: Jonathon Bellows, MD;  Location: [facility] ENDOSCOPY;  Service: Endoscopy;  Laterality: N/A;  . COLONOSCOPY WITH PROPOFOL N/A 07/24/2018   Procedure: COLONOSCOPY WITH PROPOFOL;  Surgeon: Virgel Manifold, MD;  Location: [facility] ENDOSCOPY;  Service: Endoscopy;  Laterality: N/A;  . CORONARY ARTERY BYPASS GRAFT    . ENTEROSCOPY N/A 11/21/2018   Procedure: ENTEROSCOPY-BALLOON;  Surgeon: Jonathon Bellows, MD;  Location: [facility] ENDOSCOPY;  Service: Gastroenterology;  Laterality: N/A;  . ESOPHAGOGASTRODUODENOSCOPY (EGD) WITH PROPOFOL N/A 07/24/2018   Procedure: ESOPHAGOGASTRODUODENOSCOPY (EGD) WITH PROPOFOL;  Surgeon: Virgel Manifold, MD;  Location: [facility] ENDOSCOPY;  Service: Endoscopy;  Laterality: N/A;  . GIVENS CAPSULE STUDY N/A 08/16/2018   Procedure: GIVENS CAPSULE STUDY;  Surgeon: Virgel Manifold, MD;  Location: [facility] ENDOSCOPY;  Service: Endoscopy;  Laterality: N/A;  . pace maker defib  2016  . PERIPHERAL VASCULAR CATHETERIZATION N/A 10/23/2016   Procedure: Renal Angiography;  Surgeon: Algernon Huxley, MD;  Location: [place] CV LAB;  Service: Cardiovascular;  Laterality: N/A;  . RIGHT/LEFT HEART CATH AND CORONARY ANGIOGRAPHY N/A 08/26/2019   Procedure: RIGHT/LEFT HEART CATH AND CORONARY ANGIOGRAPHY;  Surgeon: Isaias Cowman, MD;  Location: [place] CV LAB;  Service: Cardiovascular;  Laterality: N/A;  . TOTAL ABDOMINAL HYSTERECTOMY  1999   history of abnormal pap     Prior to Admission medications   Medication Sig Start Date End Date Taking? Authorizing Provider  albuterol (VENTOLIN HFA) 108 (90 Base) MCG/ACT inhaler INHALE 2 PUFFS BY MOUTH INTO THE LUNGS EVERY 6 HOURS AS NEEDED FOR WHEEZING Patient taking differently: Inhale 2 puffs into the lungs every 6 (six) hours as needed for wheezing.  09/04/19  Yes Tyler Pita, MD  ALPRAZolam Duanne Moron) 0.5 MG tablet Take 0.5-1 tablets (0.25-0.5 mg total) by mouth at bedtime. TAKE ONE TABLET BY MOUTH AT BEDTIME  AS NEEDED FOR ANXIETY Patient taking differently: Take 0.25-0.5 mg by mouth at bedtime.  09/05/19  Yes [name], MD  aspirin 81 MG tablet Take 81 mg by mouth daily.   Yes [provider]  atorvastatin (LIPITOR) 40 MG tablet TAKE ONE TABLET BY MOUTH ONCE DAILY Patient taking differently: Take 40 mg by mouth daily.  07/25/19  Yes [name], MD  carvedilol (COREG) 6.25 MG tablet Take 1 tablet (6.25 mg total) by mouth 2 (two) times daily with a meal. 09/05/19  Yes [name], MD  clopidogrel (PLAVIX) 75 MG tablet Take 1 tablet (75 mg total) by mouth daily. 09/05/19  Yes Hackney, Otila Kluver A, FNP  cyanocobalamin (,VITAMIN B-12,) 1000 MCG/ML injection Inject 1 mL into the muscle monthly 09/05/19  Yes [name], MD  diphenhydrAMINE (DIPHENHIST) 25 mg capsule Take 25 mg by mouth at bedtime as needed for allergies or sleep.    Yes [provider]  escitalopram (LEXAPRO) 10 MG tablet Take 1 tablet (10 mg total) by mouth daily. 09/05/19  Yes [name], MD  ferrous sulfate 325 (65 FE) MG EC tablet Take 325 mg by mouth daily with breakfast.   Yes [provider]  Fluticasone-Umeclidin-Vilant (TRELEGY ELLIPTA) 100-62.5-25 MCG/INH AEPB Inhale 1 puff into the lungs daily. 09/04/19  Yes Tyler Pita, MD  furosemide (LASIX) 40 MG tablet Take 1 tablet (40 mg total) by mouth 3 (three) times daily. 09/05/19  Yes Hackney, Otila Kluver A, FNP  omeprazole (PRILOSEC) 40 MG capsule Take 1 capsule (40 mg total) by mouth daily. 09/05/19  Yes [name], MD  potassium chloride (KLOR-CON) 10 MEQ tablet Take 1 tablet (10 mEq total) by mouth daily. 09/05/19  Yes [name], MD  sacubitril-valsartan (ENTRESTO) 24-26 MG Take 1 tablet by mouth 2 (two) times daily. 09/05/19  Yes Hackney, Otila Kluver A, FNP  spironolactone (ALDACTONE) 25 MG tablet Take 0.5 tablets (12.5 mg total) by mouth daily. 09/05/19 09/04/20 Yes Hackney, Otila Kluver A, FNP  traZODone (DESYREL) 50 MG tablet Take 0.5-1  tablets (25-50 mg total) by mouth daily. One hour before bedtime Patient taking differently: Take 25-50 mg by mouth at bedtime. One hour before bedtime 09/05/19  Yes [name], MD  acetaminophen (TYLENOL) 500 MG tablet Maximum 6 tablets a day. Patient not taking: Reported on 09/04/2019 02/09/15   Rubbie Battiest, RN  ipratropium-albuterol (DUONEB) 0.5-2.5 (3) MG/3ML SOLN Take 3 mLs by nebulization every 6 (six) hours as needed. Patient not taking: Reported on 09/04/2019 08/05/18   [name], MD  nitroGLYCERIN (NITROSTAT) 0.4 MG SL tablet Place 1 tablet (0.4 mg total) under the tongue every 5 (five) minutes as needed for chest pain. Patient not taking: Reported on 09/04/2019 09/03/19   [name], MD     Allergies Vancomycin and Hydrocodone-acetaminophen   Family History  Problem Relation Age of Onset  . Arthritis Mother   . Cancer Mother        uterus cancer  . Hyperlipidemia Mother   . Hypertension Mother   . Heart disease Mother   . Diabetes Mother   . Hyperlipidemia Father   . Hypertension Father   . Heart disease Father   . Diabetes Father   . Cancer Sister        ovary cancer  . Diabetes Maternal Grandmother   . Hypertension Maternal Grandmother   . Arthritis Maternal Grandmother   . Hypertension Maternal Grandfather   . Hypertension Paternal Grandmother   . Hypertension Paternal Grandfather   . Heart disease Paternal Grandfather   . Heart disease Brother   . Kidney disease Brother   . Breast cancer Neg Hx     Social History Social History   Tobacco Use  . Smoking status: Former Smoker    Packs/day: 0.25    Types: Cigarettes    Quit date: 06/19/2019    Years since quitting: 0.2  . Smokeless tobacco: Never Used  . Tobacco comment: reports smoking 2-3 cigarettes/ day  Substance Use Topics  . Alcohol use: Yes    Alcohol/week: 2.0 standard drinks    Types: 2 Shots of liquor per week    Comment: occasion  . Drug use: No    Review of  Systems Level 5 Caveat: Portions of the History and Physical including HPI and review of systems are unable to be completely obtained due to patient being a poor historian   Constitutional:   No known fever.  ENT:   No rhinorrhea. Cardiovascular:   No chest pain or syncope. Respiratory:   Positive shortness of breath without cough. Gastrointestinal:   Negative for abdominal pain, vomiting and diarrhea.  Musculoskeletal:   Negative for focal pain or swelling  ____________________________________________   PHYSICAL EXAM:  VITAL SIGNS: ED Triage Vitals  Enc Vitals Group     BP 09/22/19 0722 (!) 167/129     Pulse Rate 09/22/19 0722 (!) 111     Resp 09/22/19 0722 (!) 24     Temp --      Temp src --      SpO2 09/22/19 0722 94 %     Weight --      Height --      Head Circumference --      Peak Flow --      Pain Score 09/22/19 0725 4     Pain Loc --      Pain Edu? --      Excl. in [place]? --     Vital signs reviewed, nursing assessments reviewed.   Constitutional:   Alert and oriented.  Ill-appearing Eyes:   Conjunctivae are normal. EOMI. PERRL. ENT      Head:   Normocephalic and atraumatic.      Nose:   No congestion/rhinnorhea.       Mouth/Throat:   MMM, no pharyngeal erythema. No peritonsillar mass.       Neck:   No meningismus. Full ROM.  No JVD Hematological/Lymphatic/Immunilogical:   No cervical lymphadenopathy. Cardiovascular:   Tachycardia heart rate 110. Symmetric bilateral radial and DP pulses.  No murmurs. Cap refill less than 2 seconds. Respiratory: Tachypnea, accessory muscle use and splinting on EMS CPAP.  Diminished breath sounds diffusely, expiratory wheezing.  Crackles at bilateral bases. Gastrointestinal:   Soft and nontender. Non distended. There is no CVA tenderness.  No rebound, rigidity, or guarding.  Musculoskeletal:   Normal range of motion in all extremities. No joint effusions.  No lower extremity tenderness.  No edema. Neurologic:   Normal speech and  language.  Motor grossly intact. No acute focal neurologic deficits are appreciated.  Skin:    Skin is warm, dry and intact. No rash noted.  No petechiae, purpura, or bullae.  ____________________________________________    LABS (pertinent positives/negatives) (all labs ordered are listed, but only abnormal results are displayed) Labs Reviewed  BLOOD GAS, VENOUS - Abnormal; Notable for the following components:      Result Value   pO2, Ven 81.0 (*)    Acid-base deficit 3.7 (*)    All other components within normal limits  BASIC METABOLIC PANEL - Abnormal; Notable for the following components:   CO2 21 (*)    Glucose, Bld 413 (*)    BUN 31 (*)    Creatinine, Ser 1.44 (*)    GFR calc non Af Amer 38 (*)    GFR calc Af Amer 44 (*)    All other components within normal limits  BRAIN NATRIURETIC PEPTIDE - Abnormal; Notable for the following components:   B Natriuretic Peptide 1,885.0 (*)    All other components within normal limits  CBC WITH DIFFERENTIAL/PLATELET - Abnormal; Notable for the following components:   WBC 12.1 (*)    Neutro Abs 8.0 (*)    Basophils Absolute 0.2 (*)    Abs Immature Granulocytes 0.10 (*)    All other components within normal limits  TROPONIN I (HIGH SENSITIVITY) - Abnormal; Notable for the following components:   Troponin I (High Sensitivity) 19 (*)    All other components within normal limits  TROPONIN I (HIGH SENSITIVITY) - Abnormal; Notable for the following components:   Troponin I (High Sensitivity) 19 (*)    All other components within normal limits  SARS CORONAVIRUS 2 (TAT 6-24 HRS)  PROCALCITONIN  INFLUENZA PANEL BY PCR (TYPE A & B)   ____________________________________________   EKG  Interpreted by me Ventricular paced rhythm, tachycardia rate 108.  Right axis.  Poor R wave progression.  Nonspecific conduction delay.  No acute ischemic changes.  ____________________________________________    [id number]  Dg Chest Portable 1  View  Result Date: 09/22/2019 CLINICAL DATA:  Shortness of breath EXAM: PORTABLE CHEST 1 VIEW COMPARISON:  August 25, 2019 FINDINGS: There is cardiomegaly with pulmonary venous hypertension. There is generalized interstitial edema. There is atelectatic change in the right base and right upper lobe. There is no airspace consolidation. Pacemaker leads are attached to the right atrium, right ventricle, and coronary sinus. Patient is status post coronary artery bypass grafting. No adenopathy. There is aortic atherosclerosis. There is postoperative change in the left clavicle. IMPRESSION: Cardiomegaly with pulmonary venous hypertension. There is interstitial pulmonary edema. The appearance is felt to be indicative of a degree of congestive heart failure. Areas of atelectatic change on the right. No consolidation. Pacemaker leads attached to right atrium, right ventricle, and coronary sinus. Status post coronary artery bypass grafting. Aortic Atherosclerosis (ICD10-I70.0). Electronically Signed   By: Lowella Grip III M.D.   On: 09/22/2019 07:49    ____________________________________________   PROCEDURES .Critical Care Performed by: Carrie Mew, MD Authorized by: Carrie Mew, MD   Critical care provider statement:    Critical care time (minutes):  35   Critical care time was exclusive of:  Separately billable procedures and treating other patients   Critical care was necessary to treat or prevent imminent or life-threatening deterioration of the following conditions:  Respiratory failure   Critical care was time spent personally by me on the following activities:  Development of treatment plan with patient or surrogate, discussions with consultants, evaluation of patient's response to treatment, examination of patient, obtaining history from patient or surrogate, ordering and performing treatments and interventions, ordering and review of laboratory studies, ordering and review of  radiographic studies, pulse oximetry, re-evaluation of patient's condition and review of old charts    ____________________________________________  DIFFERENTIAL DIAGNOSIS   COPD exacerbation, CHF exacerbation/pulmonary edema, pleural effusion, pneumonia, COVID-19, pulmonary embolism  CLINICAL IMPRESSION / ASSESSMENT AND PLAN / ED COURSE  Medications ordered in the ED: Medications  methylPREDNISolone sodium succinate (SOLU-MEDROL) 125 mg/2 mL injection 125 mg (125 mg Intravenous Given 09/22/19 0731)  magnesium sulfate IVPB 2 g 50 mL (0 g Intravenous Stopped 09/22/19 0803)  cefTRIAXone (ROCEPHIN) 2 g in sodium chloride 0.9 % 100 mL IVPB ( Intravenous Stopped 09/22/19 0851)  azithromycin (ZITHROMAX) 500 mg in sodium chloride 0.9 % 250 mL IVPB (0 mg Intravenous Stopped 09/22/19 0927)  furosemide (LASIX) injection 40 mg (40 mg Intravenous Given 09/22/19 7341)    Pertinent labs & imaging results that were available during my care of the patient were reviewed by me and considered in my medical decision making (see chart for details).   Ashley [name] was evaluated in Emergency Department on 09/22/2019 for the symptoms described in the history of present illness. She was evaluated in the context of the global COVID-19 pandemic, which necessitated consideration that the patient might be at risk for infection with the SARS-CoV-2 virus that causes COVID-19. Institutional protocols and algorithms that pertain to the evaluation of patients at risk for COVID-19 are in a state of rapid change based on information released by regulatory bodies including the CDC and federal and state organizations. These policies and  algorithms were followed during the patient's care in the ED.     Clinical Course as of Sep 22 1015  Mon Sep 22, 2019  0718 Patient presents with respiratory distress, acute onset at about 3:00 AM today.  Found to be in tripod position, in extremis on arrival of first responders and  EMS.  Received a total of 5 nebulizer treatments prior to arrival in the ED.  Give IV Solu-Medrol and magnesium bolus, continue BiPAP, check chest x-ray and labs, plan to admit. Given most likely CHF or COPD, I doubt pneumonia or sepsis on initial assessment.   [PS]  [id number] Chest c/w pulm edema and chf exac.  BNP elevated. Will give 40mg  IV lasix.    [PS]  0909 Procalcitonin negative, no infiltrate on chest x-ray, would defer any additional antibiotics at this point.   [PS]  0920 Oxygenation remained steady at 100% on BiPAP.  Lungs are clear to auscultation.  Patient is resting comfortably.  Will transition to 4 L nasal cannula. Consult hospitalist.    [PS]    Clinical Course User Index [PS] Carrie Mew, MD     ----------------------------------------- 10:16 AM on 09/22/2019 -----------------------------------------  Oxygenation remains stable on 4 L nasal cannula.  ____________________________________________   FINAL CLINICAL IMPRESSION(S) / ED DIAGNOSES    Final diagnoses:  Acute respiratory failure with hypoxia ([place])  Acute on chronic congestive heart failure, unspecified heart failure type [facility])     ED Discharge Orders    None      Portions of this note were generated with dragon dictation software. Dictation errors may occur despite best attempts at proofreading.   Carrie Mew, MD 09/22/19 1016

## 2019-09-22 NOTE — ED Notes (Signed)
Patient complaining of central chest pain, states "I'm sweating. I can't breath. I'm not getting any air in." Patient in tripod position with visible increased work of breathing, accessory muscle use. Admitting MD aware. EDP at bedside with patient to assess and give orders as needed. RT paged and en route.

## 2019-09-22 NOTE — Consult Note (Signed)
[facility] Cardiology  CARDIOLOGY CONSULT NOTE  Patient ID: PYPER OLEXA MRN: [id number] DOB/AGE: Jan 28, 1951 68 y.o.  Admit date: 09/22/2019 Referring Physician Posey Pronto Primary Physician [facility] Primary Cardiologist Aahana Elza Reason for Consultation congestive heart failure  HPI: 68 year old female referred for evaluation of congestive heart failure.  The patient has known coronary artery disease, status post CABG and mitral valve repair 09/21/2006.  She underwent recent cardiac catheterization 08/26/2019 which revealed patent LIMA to LAD, occluded SVG to OM and PDA, dilated cardiomyopathy 20-25%, moderate to severe pulmonary hypertension with mean 55 mmHg, moderate mitral regurgitation and mitral stenosis.  In addition, patient has known COPD with multiple recent exacerbations.  She is status post BiV ICD 01/2016.  She was recently discharged 2 weeks ago, was doing well early this a.m., when she developed acute onset of shortness of breath, improved after multiple nebulizer treatments and started on CPAP.  Admission labs notable for high-sensitivity troponin 19 and 19.  BNP was 1885.  Chest x-ray revealed interstitial edema.  Review of systems complete and found to be negative unless listed above     Past Medical History:  Diagnosis Date  . AICD (automatic cardioverter/defibrillator) present    on right side  . Anemia   . CAD (coronary artery disease)    s/p CABG  . CHF (congestive heart failure) ([place])   . Chronic kidney disease    renal artery stenosis  . Colon cancer ([place])   . Depression   . GERD (gastroesophageal reflux disease)   . Hx of colonic polyps   . Hyperlipidemia   . Hypertension   . Mitral valve disorder    s/p mitral valve repair wth CABG  . Myocardial infarction ([place])   . Peripheral vascular disease ([place])   . Presence of permanent cardiac pacemaker    Pacemaker/ Defibrillator  . Tobacco abuse 10/11/2014    Past Surgical History:  Procedure Laterality Date  . arm  surgery     fracture, has plates and screws  . CABG with mitral valve repair    . CARDIAC CATHETERIZATION    . COLON SURGERY     colon cancer  . COLONOSCOPY WITH PROPOFOL N/A 10/09/2016   Procedure: COLONOSCOPY WITH PROPOFOL;  Surgeon: Jonathon Bellows, MD;  Location: [facility] ENDOSCOPY;  Service: Endoscopy;  Laterality: N/A;  . COLONOSCOPY WITH PROPOFOL N/A 07/24/2018   Procedure: COLONOSCOPY WITH PROPOFOL;  Surgeon: Virgel Manifold, MD;  Location: [facility] ENDOSCOPY;  Service: Endoscopy;  Laterality: N/A;  . CORONARY ARTERY BYPASS GRAFT    . ENTEROSCOPY N/A 11/21/2018   Procedure: ENTEROSCOPY-BALLOON;  Surgeon: Jonathon Bellows, MD;  Location: [facility] ENDOSCOPY;  Service: Gastroenterology;  Laterality: N/A;  . ESOPHAGOGASTRODUODENOSCOPY (EGD) WITH PROPOFOL N/A 07/24/2018   Procedure: ESOPHAGOGASTRODUODENOSCOPY (EGD) WITH PROPOFOL;  Surgeon: Virgel Manifold, MD;  Location: [facility] ENDOSCOPY;  Service: Endoscopy;  Laterality: N/A;  . GIVENS CAPSULE STUDY N/A 08/16/2018   Procedure: GIVENS CAPSULE STUDY;  Surgeon: Virgel Manifold, MD;  Location: [facility] ENDOSCOPY;  Service: Endoscopy;  Laterality: N/A;  . pace maker defib  2016  . PERIPHERAL VASCULAR CATHETERIZATION N/A 10/23/2016   Procedure: Renal Angiography;  Surgeon: Algernon Huxley, MD;  Location: [place] CV LAB;  Service: Cardiovascular;  Laterality: N/A;  . RIGHT/LEFT HEART CATH AND CORONARY ANGIOGRAPHY N/A 08/26/2019   Procedure: RIGHT/LEFT HEART CATH AND CORONARY ANGIOGRAPHY;  Surgeon: Isaias Cowman, MD;  Location: [place] CV LAB;  Service: Cardiovascular;  Laterality: N/A;  . TOTAL ABDOMINAL HYSTERECTOMY  1999   history of abnormal  pap    (Not in a hospital admission)  Social History   Socioeconomic History  . Marital status: Single    Spouse name: Not on file  . Number of children: 0  . Years of education: 37  . Highest education level: 11th grade  Occupational History  . Occupation: disabled  Social Needs  . Financial  resource strain: Not hard at all  . Food insecurity    Worry: Never true    Inability: Never true  . Transportation needs    Medical: No    Non-medical: No  Tobacco Use  . Smoking status: Former Smoker    Packs/day: 0.25    Types: Cigarettes    Quit date: 06/19/2019    Years since quitting: 0.2  . Smokeless tobacco: Never Used  . Tobacco comment: reports smoking 2-3 cigarettes/ day  Substance and Sexual Activity  . Alcohol use: Yes    Alcohol/week: 2.0 standard drinks    Types: 2 Shots of liquor per week    Comment: occasion  . Drug use: No  . Sexual activity: Yes    Comment: 1 partner  Lifestyle  . Physical activity    Days per week: 0 days    Minutes per session: 0 min  . Stress: Only a little  Relationships  . Social Herbalist on phone: Once a week    Gets together: Once a week    Attends religious service: Never    Active member of club or organization: No    Attends meetings of clubs or organizations: Never    Relationship status: Living with partner  . Intimate partner violence    Fear of current or ex partner: No    Emotionally abused: No    Physically abused: No    Forced sexual activity: No  Other Topics Concern  . Not on file  Social History Narrative   07/01/2019: Patient reports she has stopped smoking completely since she was hospitalized on June 19, 2019    Family History  Problem Relation Age of Onset  . Arthritis Mother   . Cancer Mother        uterus cancer  . Hyperlipidemia Mother   . Hypertension Mother   . Heart disease Mother   . Diabetes Mother   . Hyperlipidemia Father   . Hypertension Father   . Heart disease Father   . Diabetes Father   . Cancer Sister        ovary cancer  . Diabetes Maternal Grandmother   . Hypertension Maternal Grandmother   . Arthritis Maternal Grandmother   . Hypertension Maternal Grandfather   . Hypertension Paternal Grandmother   . Hypertension Paternal Grandfather   . Heart disease Paternal  Grandfather   . Heart disease Brother   . Kidney disease Brother   . Breast cancer Neg Hx       Review of systems complete and found to be negative unless listed above      PHYSICAL EXAM  General: Well developed, well nourished, in no acute distress HEENT:  Normocephalic and atramatic Neck:  No JVD.  Lungs: Clear bilaterally to auscultation and percussion. Heart: HRRR . Normal S1 and S2 without gallops or murmurs.  Abdomen: Bowel sounds are positive, abdomen soft and non-tender  Msk:  Back normal, normal gait. Normal strength and tone for age. Extremities: No clubbing, cyanosis or edema.   Neuro: Alert and oriented X 3. Psych:  Good affect, responds appropriately  Labs:  Lab Results  Component Value Date   WBC 12.1 (H) 09/22/2019   HGB 14.9 09/22/2019   HCT 44.4 09/22/2019   MCV 94.1 09/22/2019   PLT 342 09/22/2019    Recent Labs  Lab 09/22/19 0719  NA 136  K 5.1  CL 101  CO2 21*  BUN 31*  CREATININE 1.44*  CALCIUM 9.2  GLUCOSE 413*   Lab Results  Component Value Date   CKTOTAL 44 05/30/2013   CKMB 33.2 (H) 08/19/2014   TROPONINI <0.03 03/16/2019    Lab Results  Component Value Date   CHOL 148 11/08/2018   CHOL 222 (H) 05/06/2018   CHOL 229 (H) 06/06/2016   Lab Results  Component Value Date   HDL 39.60 11/08/2018   HDL 41 (L) 05/06/2018   HDL 35.30 (L) 06/06/2016   Lab Results  Component Value Date   LDLCALC 139 (H) 05/06/2018   LDLCALC 90 10/09/2014   LDLCALC 107 (H) 05/30/2013   Lab Results  Component Value Date   TRIG (H) 11/08/2018    550.0 Triglyceride is over 400; calculations on Lipids are invalid.   TRIG 276 (H) 05/06/2018   TRIG (H) 06/06/2016    477.0 Triglyceride is over 400; calculations on Lipids are invalid.   Lab Results  Component Value Date   CHOLHDL 4 11/08/2018   CHOLHDL 5.4 (H) 05/06/2018   CHOLHDL 6 06/06/2016   Lab Results  Component Value Date   LDLDIRECT 62.0 11/08/2018   LDLDIRECT 155.0 06/06/2016    LDLDIRECT 128 (H) 10/09/2014      Radiology: Dg Chest 2 View  Result Date: 09/22/2019 CLINICAL DATA:  Shortness of breath, cough. EXAM: CHEST - 2 VIEW COMPARISON:  Same day. FINDINGS: Stable cardiomegaly. Status post coronary bypass graft. No pneumothorax is noted. Right-sided pacemaker is unchanged in position. Stable diffuse interstitial densities are noted throughout both lungs concerning for pulmonary edema with probable small bilateral pleural effusions. Bony thorax is unremarkable. IMPRESSION: Stable cardiomegaly with bilateral pulmonary edema and probable small bilateral pleural effusions. Electronically Signed   By: Marijo Conception M.D.   On: 09/22/2019 13:35   Dg Chest Portable 1 View  Result Date: 09/22/2019 CLINICAL DATA:  Shortness of breath EXAM: PORTABLE CHEST 1 VIEW COMPARISON:  August 25, 2019 FINDINGS: There is cardiomegaly with pulmonary venous hypertension. There is generalized interstitial edema. There is atelectatic change in the right base and right upper lobe. There is no airspace consolidation. Pacemaker leads are attached to the right atrium, right ventricle, and coronary sinus. Patient is status post coronary artery bypass grafting. No adenopathy. There is aortic atherosclerosis. There is postoperative change in the left clavicle. IMPRESSION: Cardiomegaly with pulmonary venous hypertension. There is interstitial pulmonary edema. The appearance is felt to be indicative of a degree of congestive heart failure. Areas of atelectatic change on the right. No consolidation. Pacemaker leads attached to right atrium, right ventricle, and coronary sinus. Status post coronary artery bypass grafting. Aortic Atherosclerosis (ICD10-I70.0). Electronically Signed   By: Lowella Grip III M.D.   On: 09/22/2019 07:49   Dg Chest Port 1 View  Result Date: 08/25/2019 CLINICAL DATA:  Acute respiratory failure EXAM: PORTABLE CHEST 1 VIEW COMPARISON:  Yesterday FINDINGS: Cardiomegaly with  diffuse interstitial opacity that is mildly improved. CABG and biventricular pacer implant. No air bronchogram, effusion, or pneumothorax. Nodular density at the right base with no correlate on recent chest CT. No effusion or pneumothorax. IMPRESSION: Improved pulmonary edema. Electronically Signed   By: Monte Fantasia  M.D.   On: 08/25/2019 04:57   Dg Chest Port 1 View  Result Date: 08/24/2019 CLINICAL DATA:  Shortness of breath EXAM: PORTABLE CHEST 1 VIEW COMPARISON:  08/12/2019 FINDINGS: Cardiomegaly with mild interstitial edema, similar to the prior. No definite pleural effusions. No pneumothorax. Right subclavian ICD.  Postsurgical changes related to prior CABG. Median sternotomy. Status post ORIF of the left clavicle. IMPRESSION: Cardiomegaly with mild interstitial edema. Electronically Signed   By: Julian Hy M.D.   On: 08/24/2019 09:22    EKG: ECG revealed atrial sensing with ventricular pacing.  ASSESSMENT AND PLAN:   1.  Respiratory failure, likely multifactorial, secondary to COPD exacerbation, and acute on chronic systolic congestive heart failure, complicated by underlying moderate to severe pulmonary hypertension, improved after initial nebulizer therapy. 2.  Acute on chronic systolic congestive heart failure with known LVEF 20-25%, status post BiV ICD 3.  CAD, status post CABG, with recent cardiac catheterization revealing patent LIMA to LAD, and chronically occluded SVG to OM and PDA, currently without chest pain, and near normal high-sensitivity troponin, with nondiagnostic ECG 4.  Moderate mitral stenosis and mitral regurgitation, with underlying moderate to severe pulmonary hypertension  Recommendations  1.  Agree with overall current therapy 2.  Continue diuresis 3.  Carefully monitor renal status 4.  Defer full dose anticoagulation 5.  Patient scheduled for outpatient cardiac MRI at [facility] to further delineate mitral valve disease and pulmonary hypertension 6.   Continue dual antiplatelet therapy 7.  Continue carvedilol 6.25 mg twice daily  Signed: Isaias Cowman MD,PhD, [facility] 09/22/2019, 1:49 PM

## 2019-09-22 NOTE — ED Notes (Signed)
Patient taken off of bipap by EDP for trial on [place]. Placed on 4L [place]. O2 sat remains in high 90s at this time. Will continue to monitor for hypoxia and increased WOB. Patient reports significant improvement in work of breathing

## 2019-09-22 NOTE — ED Notes (Signed)
Pt linen, gown, and briefs changed. Purewick repositioned for better suction.

## 2019-09-22 NOTE — ED Notes (Signed)
Pt transported to xray 

## 2019-09-22 NOTE — ED Triage Notes (Signed)
Patient from home via [facility]. Patient reports worsening SOB this morning. Hx COPD and CHF. Patient given 4 albuterol treatments and 1 duoneb treatment by EMS. Patient with room air sat in 80's  Per EMS. Patient arrives on CPAP, in tripod position with accessory muscle use.

## 2019-09-23 ENCOUNTER — Ambulatory Visit: Payer: Medicare Other | Admitting: Internal Medicine

## 2019-09-23 DIAGNOSIS — I160 Hypertensive urgency: Secondary | ICD-10-CM

## 2019-09-23 DIAGNOSIS — I052 Rheumatic mitral stenosis with insufficiency: Secondary | ICD-10-CM

## 2019-09-23 DIAGNOSIS — F329 Major depressive disorder, single episode, unspecified: Secondary | ICD-10-CM

## 2019-09-23 DIAGNOSIS — F32A Depression, unspecified: Secondary | ICD-10-CM

## 2019-09-23 DIAGNOSIS — J9601 Acute respiratory failure with hypoxia: Secondary | ICD-10-CM

## 2019-09-23 DIAGNOSIS — J439 Emphysema, unspecified: Secondary | ICD-10-CM

## 2019-09-23 DIAGNOSIS — I5023 Acute on chronic systolic (congestive) heart failure: Secondary | ICD-10-CM

## 2019-09-23 DIAGNOSIS — N1831 Chronic kidney disease, stage 3a: Secondary | ICD-10-CM

## 2019-09-23 LAB — CBC WITH DIFFERENTIAL/PLATELET
Abs Immature Granulocytes: 0.06 10*3/uL (ref 0.00–0.07)
Basophils Absolute: 0 10*3/uL (ref 0.0–0.1)
Basophils Relative: 0 %
Eosinophils Absolute: 0 10*3/uL (ref 0.0–0.5)
Eosinophils Relative: 0 %
HCT: 33.3 % — ABNORMAL LOW (ref 36.0–46.0)
Hemoglobin: 11.6 g/dL — ABNORMAL LOW (ref 12.0–15.0)
Immature Granulocytes: 1 %
Lymphocytes Relative: 8 %
Lymphs Abs: 0.7 10*3/uL (ref 0.7–4.0)
MCH: 31.7 pg (ref 26.0–34.0)
MCHC: 34.8 g/dL (ref 30.0–36.0)
MCV: 91 fL (ref 80.0–100.0)
Monocytes Absolute: 0.4 10*3/uL (ref 0.1–1.0)
Monocytes Relative: 4 %
Neutro Abs: 8 10*3/uL — ABNORMAL HIGH (ref 1.7–7.7)
Neutrophils Relative %: 87 %
Platelets: 216 10*3/uL (ref 150–400)
RBC: 3.66 MIL/uL — ABNORMAL LOW (ref 3.87–5.11)
RDW: 15 % (ref 11.5–15.5)
WBC: 9.1 10*3/uL (ref 4.0–10.5)
nRBC: 0 % (ref 0.0–0.2)

## 2019-09-23 LAB — ANA W/REFLEX IF POSITIVE: Anti Nuclear Antibody (ANA): NEGATIVE

## 2019-09-23 LAB — GLUCOSE, CAPILLARY
Glucose-Capillary: 149 mg/dL — ABNORMAL HIGH (ref 70–99)
Glucose-Capillary: 158 mg/dL — ABNORMAL HIGH (ref 70–99)
Glucose-Capillary: 163 mg/dL — ABNORMAL HIGH (ref 70–99)
Glucose-Capillary: 185 mg/dL — ABNORMAL HIGH (ref 70–99)
Glucose-Capillary: 195 mg/dL — ABNORMAL HIGH (ref 70–99)
Glucose-Capillary: 226 mg/dL — ABNORMAL HIGH (ref 70–99)

## 2019-09-23 LAB — COMPREHENSIVE METABOLIC PANEL
ALT: 13 U/L (ref 0–44)
AST: 20 U/L (ref 15–41)
Albumin: 3.7 g/dL (ref 3.5–5.0)
Alkaline Phosphatase: 53 U/L (ref 38–126)
Anion gap: 14 (ref 5–15)
BUN: 35 mg/dL — ABNORMAL HIGH (ref 8–23)
CO2: 23 mmol/L (ref 22–32)
Calcium: 9 mg/dL (ref 8.9–10.3)
Chloride: 99 mmol/L (ref 98–111)
Creatinine, Ser: 1.33 mg/dL — ABNORMAL HIGH (ref 0.44–1.00)
GFR calc Af Amer: 48 mL/min — ABNORMAL LOW (ref >60)
GFR calc non Af Amer: 42 mL/min — ABNORMAL LOW (ref >60)
Glucose, Bld: 155 mg/dL — ABNORMAL HIGH (ref 70–99)
Potassium: 4.2 mmol/L (ref 3.5–5.1)
Sodium: 136 mmol/L (ref 135–145)
Total Bilirubin: 0.9 mg/dL (ref 0.3–1.2)
Total Protein: 6.2 g/dL — ABNORMAL LOW (ref 6.5–8.1)

## 2019-09-23 LAB — FOLATE: Folate: 5.4 ng/mL — ABNORMAL LOW (ref >5.9)

## 2019-09-23 LAB — IRON AND TIBC
Iron: 57 ug/dL (ref 28–170)
Saturation Ratios: 15 % (ref 10.4–31.8)
TIBC: 386 ug/dL (ref 250–450)
UIBC: 329 ug/dL

## 2019-09-23 LAB — PHOSPHORUS: Phosphorus: 2.6 mg/dL (ref 2.5–4.6)

## 2019-09-23 LAB — MAGNESIUM: Magnesium: 2.2 mg/dL (ref 1.7–2.4)

## 2019-09-23 LAB — TROPONIN I (HIGH SENSITIVITY): Troponin I (High Sensitivity): 22 ng/L — ABNORMAL HIGH (ref <18)

## 2019-09-23 LAB — RETICULOCYTES
Immature Retic Fract: 7.6 % (ref 2.3–15.9)
RBC.: 3.66 MIL/uL — ABNORMAL LOW (ref 3.87–5.11)
Retic Count, Absolute: 92.2 10*3/uL (ref 19.0–186.0)
Retic Ct Pct: 2.5 % (ref 0.4–3.1)

## 2019-09-23 LAB — VITAMIN B12: Vitamin B-12: 2276 pg/mL — ABNORMAL HIGH (ref 180–914)

## 2019-09-23 LAB — FERRITIN: Ferritin: 50 ng/mL (ref 11–307)

## 2019-09-23 LAB — TSH: TSH: 0.725 u[IU]/mL (ref 0.350–4.500)

## 2019-09-23 MED ORDER — SACUBITRIL-VALSARTAN 24-26 MG PO TABS
1.0000 | ORAL_TABLET | Freq: Two times a day (BID) | ORAL | Status: DC
  Administered 2019-09-23 – 2019-09-24 (×3): 1 via ORAL
  Filled 2019-09-23 (×5): qty 1

## 2019-09-23 MED ORDER — PREDNISONE 20 MG PO TABS
20.0000 mg | ORAL_TABLET | Freq: Every day | ORAL | Status: DC
  Administered 2019-09-24: 20 mg via ORAL
  Filled 2019-09-23: qty 1

## 2019-09-23 MED ORDER — FUROSEMIDE 10 MG/ML IJ SOLN: 40.0000 mg | Freq: Every day | INTRAMUSCULAR | Status: DC

## 2019-09-23 NOTE — TOC Initial Note (Signed)
Transition of Care [facility]) - Initial/Assessment Note    Patient Details  Name: Ashley [name] MRN: [id number] Date of Birth: 1951-07-02  Transition of Care [facility]) CM/SW Contact:    Annamaria Boots, [place] Phone Number: 09/23/2019, 3:09 PM  Clinical Narrative:   Patient is at extreme risk for readmission to the hospital. CSW met with patient to discuss discharge plan. Patient lives with her fiance and her fiance's brother. Patient reports that she has transportation provided by family and friends. Patient's PCP is Dr. Derrel Nip and she uses [place] for pharmacy. Patient states that she has no needs at this time.                 Expected Discharge Plan: Home/Self Care Barriers to Discharge: Continued Medical Work up   Patient Goals and CMS Choice Patient states their goals for this hospitalization and ongoing recovery are:: return home      Expected Discharge Plan and Services Expected Discharge Plan: Home/Self Care       Living arrangements for the past 2 months: Single Family Home                                      Prior Living Arrangements/Services Living arrangements for the past 2 months: Single Family Home Lives with:: Significant Other(Fiance) Patient language and need for interpreter reviewed:: Yes Do you feel safe going back to the place where you live?: Yes      Need for Family Participation in Patient Care: Yes (Comment) Care giver support system in place?: Yes (comment) Current home services: DME Criminal Activity/Legal Involvement Pertinent to Current Situation/Hospitalization: No - Comment as needed  Activities of Daily Living Home Assistive Devices/Equipment: Walker (specify type), Cane (specify quad or straight) ADL Screening (condition at time of admission) Patient's cognitive ability adequate to safely complete daily activities?: Yes Is the patient deaf or have difficulty hearing?: No Does the patient have difficulty seeing, even when  wearing glasses/contacts?: No Does the patient have difficulty concentrating, remembering, or making decisions?: No Patient able to express need for assistance with ADLs?: Yes Does the patient have difficulty dressing or bathing?: No Independently performs ADLs?: Yes (appropriate for developmental age) Does the patient have difficulty walking or climbing stairs?: No Weakness of Legs: None Weakness of Arms/Hands: None  Permission Sought/Granted Permission sought to share information with : Case Manager, Customer service manager, Family Supports Permission granted to share information with : Yes, Verbal Permission Granted              Emotional Assessment Appearance:: Appears stated age Attitude/Demeanor/Rapport: Engaged Affect (typically observed): Accepting, Pleasant Orientation: : Oriented to Self, Oriented to Place, Oriented to  Time, Oriented to Situation Alcohol / Substance Use: Not Applicable Psych Involvement: No (comment)  Admission diagnosis:  Acute respiratory failure with hypoxia (HCC) [J96.01] Acute on chronic congestive heart failure, unspecified heart failure type [place]) [I50.9] Patient Active Problem List   Diagnosis Date Noted  . Respiratory failure with hypoxia ([place]) 09/22/2019  . Hyperglycemia 09/22/2019  . Presence of permanent cardiac pacemaker   . Anemia   . Chronic kidney disease   . Hypertension   . Mitral valve disorder 08/25/2019  . Acute respiratory failure ([place]) 08/24/2019  . Acute CHF ([place]) 08/12/2019  . CHF (congestive heart failure) ([place]) 07/16/2019  . Cough with hemoptysis 06/09/2019  . Insomnia 05/07/2019  . Hypokalemia 03/23/2019  . Hyperglycemia, drug-induced  03/23/2019  . COVID-19 virus not detected 03/23/2019  . Respiratory failure ([place]) 03/16/2019  . AVM (arteriovenous malformation) of small bowel, acquired   . Anemia, iron deficiency 11/10/2018  . Chronic systolic heart failure ([place]) 11/06/2018  . Rectal polyp   . Benign  neoplasm of cecum   . Barrett's esophagus without dysplasia   . Stomach irritation   . Chronic diastolic heart failure ([place]) 07/03/2018  . HTN (hypertension) 07/03/2018  . 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 B12 deficiency anemia 06/28/2018  . Hypotension 05/11/2018  . Prediabetes 05/11/2018  . Personal history of colon cancer   . Benign neoplasm of descending colon   . Polyp of sigmoid colon   . Benign neoplasm of transverse colon   . Diverticulosis of large intestine without diverticulitis   . 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 Renal artery stenosis ([place]) 10/03/2016  . 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 Hospital discharge follow-up 02/09/2015  . Cough in adult 10/21/2014  . Tobacco abuse counseling 10/11/2014  . Tobacco abuse 10/11/2014  . Chronic right hip pain 08/26/2014  . Atherosclerosis of native artery of extremity with intermittent claudication ([place]) 06/18/2013  . Preoperative evaluation to rule out surgical contraindication 06/18/2013  . CAD (coronary artery disease) 06/01/2013  . GERD (gastroesophageal reflux disease) 06/01/2013  . Hypercholesterolemia 06/01/2013  . Tubular adenoma of colon 06/01/2013  . 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 [place] [address] Phone: [phone] Fax: [phone]     Social Determinants of Health (SDOH) Interventions    Readmission Risk Interventions Readmission Risk Prevention Plan 09/23/2019 08/26/2019 08/13/2019  Transportation Screening Complete Complete Complete  PCP or Specialist Appt within 3-5 Days - - -  HRI or [place] - - -  Palliative Care Screening - - -  Medication Review (RN Care  Manager) - Complete Complete  PCP or Specialist appointment within 3-5 days of discharge Complete Complete Complete  HRI or [place] - Patient refused Complete  SW Recovery Care/Counseling Consult - Complete Complete  Palliative Care Screening Not Applicable Not Applicable Not [place] Not Applicable Not Applicable Not Applicable  Some recent data might be hidden

## 2019-09-23 NOTE — Progress Notes (Signed)
[facility] Cardiology  SUBJECTIVE: The patient reports feeling "a lot better than yesterday." She denies chest pain. Her breathing has improved.    Vitals:   09/23/19 0800 09/23/19 0900 09/23/19 1000 09/23/19 1100  BP: 115/75 (!) 133/113    Pulse: (!) 106 (!) 107 (!) 105 87  Resp:  20 15 19   Temp: 98.6 F (37 C)     TempSrc: Oral     SpO2: 98% 98% 98% 95%  Weight:      Height:         Intake/Output Summary (Last 24 hours) at 09/23/2019 1240 Last data filed at 09/23/2019 0800 Gross per 24 hour  Intake 749.5 ml  Output 750 ml  Net -0.5 ml      PHYSICAL EXAM  General: Well developed, well nourished, in no acute distress, lying in bed watching TV HEENT:  Normocephalic and atramatic Neck:  No JVD.  Lungs: Clear bilaterally to auscultation, normal effort of breathing on 4L [place] Heart: Regular, tachycardic . Normal S1 and S2 without gallops or murmurs.  Abdomen: no obvious distention Msk:  Back normal, gait not assessed. Normal strength and tone for age. Extremities: No clubbing, cyanosis or edema.   Neuro: Alert and oriented X 3. Psych:  Good affect, responds appropriately   LABS: Basic Metabolic Panel: Recent Labs    09/22/19 0719 09/22/19 1757 09/23/19 0443  NA 136  --  136  K 5.1  --  4.2  CL 101  --  99  CO2 21*  --  23  GLUCOSE 413*  --  155*  BUN 31*  --  35*  CREATININE 1.44*  --  1.33*  CALCIUM 9.2  --  9.0  MG  --  2.3 2.2  PHOS  --  5.9* 2.6   Liver Function Tests: Recent Labs    09/23/19 0443  AST 20  ALT 13  ALKPHOS 53  BILITOT 0.9  PROT 6.2*  ALBUMIN 3.7   No results for input(s): LIPASE, AMYLASE in the last 72 hours. CBC: Recent Labs    09/22/19 0719 09/23/19 0443  WBC 12.1* 9.1  NEUTROABS 8.0* 8.0*  HGB 14.9 11.6*  HCT 44.4 33.3*  MCV 94.1 91.0  PLT 342 216   Cardiac Enzymes: No results for input(s): CKTOTAL, CKMB, CKMBINDEX, TROPONINI in the last 72 hours. BNP: Invalid input(s): POCBNP D-Dimer: No results for input(s): DDIMER  in the last 72 hours. Hemoglobin A1C: No results for input(s): HGBA1C in the last 72 hours. Fasting Lipid Panel: No results for input(s): CHOL, HDL, LDLCALC, TRIG, CHOLHDL, LDLDIRECT in the last 72 hours. Thyroid Function Tests: Recent Labs    09/23/19 0443  TSH 0.725   Anemia Panel: Recent Labs    09/23/19 0443  FOLATE 5.4*  FERRITIN 50  TIBC 386  IRON 57  RETICCTPCT 2.5    Dg Chest 2 View  Result Date: 09/22/2019 CLINICAL DATA:  Shortness of breath, cough. EXAM: CHEST - 2 VIEW COMPARISON:  Same day. FINDINGS: Stable cardiomegaly. Status post coronary bypass graft. No pneumothorax is noted. Right-sided pacemaker is unchanged in position. Stable diffuse interstitial densities are noted throughout both lungs concerning for pulmonary edema with probable small bilateral pleural effusions. Bony thorax is unremarkable. IMPRESSION: Stable cardiomegaly with bilateral pulmonary edema and probable small bilateral pleural effusions. Electronically Signed   By: Marijo Conception M.D.   On: 09/22/2019 13:35   Dg Chest Portable 1 View  Result Date: 09/22/2019 CLINICAL DATA:  Admitted to [facility] for shortness of  breath, initially weaned off BiPAP, now worsening EXAM: PORTABLE CHEST 1 VIEW COMPARISON:  Radiograph 09/22/2019 FINDINGS: Cardiac pacer pack overlies the right chest wall with leads at the right atrium, coronary sinus and cardiac apex. Postsurgical changes related to prior CABG including intact and aligned sternotomy wires and multiple surgical clips projecting over the mediastinum. Distal left clavicular plate screw fixation hardware is noted. Degenerative changes are present in the imaged spine and shoulders. Worsening bilateral interstitial and airspace opacities most pronounced in the right lung. Associated septal thickening is present with central vascular cuffing. Mild cardiomegaly, similar to prior accounting for differences in technique. No pneumothorax. No visible effusion. IMPRESSION:  Worsening bilateral interstitial and airspace opacities, most pronounced in the right lung, consistent with worsening pulmonary edema. Superimposed infection cannot be excluded. Electronically Signed   By: Lovena Le M.D.   On: 09/22/2019 18:16   Dg Chest Portable 1 View  Result Date: 09/22/2019 CLINICAL DATA:  Shortness of breath EXAM: PORTABLE CHEST 1 VIEW COMPARISON:  August 25, 2019 FINDINGS: There is cardiomegaly with pulmonary venous hypertension. There is generalized interstitial edema. There is atelectatic change in the right base and right upper lobe. There is no airspace consolidation. Pacemaker leads are attached to the right atrium, right ventricle, and coronary sinus. Patient is status post coronary artery bypass grafting. No adenopathy. There is aortic atherosclerosis. There is postoperative change in the left clavicle. IMPRESSION: Cardiomegaly with pulmonary venous hypertension. There is interstitial pulmonary edema. The appearance is felt to be indicative of a degree of congestive heart failure. Areas of atelectatic change on the right. No consolidation. Pacemaker leads attached to right atrium, right ventricle, and coronary sinus. Status post coronary artery bypass grafting. Aortic Atherosclerosis (ICD10-I70.0). Electronically Signed   By: Lowella Grip III M.D.   On: 09/22/2019 07:49     Echo LVEF 40-45%, moderate to severe MR  TELEMETRY: sinus rhythm  ASSESSMENT AND PLAN:  Principal Problem:   Respiratory failure with hypoxia (HCC) Active Problems:   CAD (coronary artery disease)   Tubular adenoma of colon   Personal history of colon cancer   Prediabetes   COPD with emphysema (HCC)   HTN (hypertension)   Chronic systolic heart failure (HCC)   Acute respiratory failure (HCC)   Anemia    1. Respiratory failure, multifactorial, secondary to COPD exacerbation, acute on chronic systolic CHF, and moderate to severe pulmonary hypertension. The patient reports feeling  much better after diuresis, BiPAP, IV steroids, and nebulizer. 2. Acute on chronic systolic CHF and dilated cardiomyopathy with LVEF 20-25%, status post BiV ICD 3. Coronary artery disease, status post CABG, with recent cardiac catheterization on 08/26/2019 revealing patent LIMA to LAD, chronically occluded SVG to OM and PDA. The patient denies chest pain. ECG is non-diagnostic. Troponin borderline elevated without delta (19, 19, 28, 25, 22). 4. Moderate mitral stenosis and regurgitation 5. Flash pulmonary edema secondary to hypertensive emergency, started on nitroglycerin drip, CPAP, ativan, IV Lasix. Nitro drip has since been discontinued. Chest xray revealed worsening bilateral interstitial and airspace opacities, most pronounced in the right lung, consistent with worsening pulmonary edema. Superimposed infection could not be excluded.  Recommendations: 1. Continue carvedilol, aspirin, Plavix, atorvastatin. Resume Entresto 2. Awaiting call back from scheduling department at [facility] in regards to outpatient appointment for cardiac MRI. 3. IV Lasix 40 mg once daily for now with careful monitoring of renal status  Clabe Seal, Hershal Coria 09/23/2019 12:40 PM Discussed with Dr. Saralyn Pilar; plan made in collaboration with him.

## 2019-09-23 NOTE — Progress Notes (Signed)
Pt on room air  Overnight pulse oximetery study  .

## 2019-09-23 NOTE — Progress Notes (Addendum)
Pt received from ED via stretcher to rm #14. A & O x 4. Lungs with rhonchi on Bipap..Rt hand #22g with NTG gtt infusing.  She denies pain, no resp. distress. Will continue to monitor pt closely.

## 2019-09-23 NOTE — Progress Notes (Signed)
Patient ID: Ashley [name], female   DOB: 03/04/1951, 68 y.o.   MRN: [id number] [facility] PROGRESS NOTE  Ashley [name] Ashley [name] DOA: 09/22/2019 PCP: [name], MD  HPI/Subjective: Patient feeling better than when she came in.  Was admitted to the CCU for BiPAP.  Breathing is much better than when she came in.  No chest pain.  Not much cough.  Objective: Vitals:   09/23/19 1000 09/23/19 1100  BP:    Pulse: (!) 105 87  Resp: 15 19  Temp:    SpO2: 98% 95%    Intake/Output Summary (Last 24 hours) at 09/23/2019 1520 Last data filed at 09/23/2019 1300 Gross per 24 hour  Intake 1229.5 ml  Output 750 ml  Net 479.5 ml   Filed Weights   09/22/19 0725 09/22/19 2115  Weight: 84.6 kg 75.7 kg    ROS: Review of Systems  Constitutional: Negative for chills and fever.  Eyes: Negative for blurred vision.  Respiratory: Positive for cough and shortness of breath.   Cardiovascular: Negative for chest pain.  Gastrointestinal: Negative for abdominal pain, constipation, diarrhea, nausea and vomiting.  Genitourinary: Negative for dysuria.  Musculoskeletal: Negative for joint pain.  Neurological: Negative for dizziness and headaches.   Exam: Physical Exam  Constitutional: She is oriented to person, place, and time.  HENT:  Nose: No mucosal edema.  Mouth/Throat: No oropharyngeal exudate or posterior oropharyngeal edema.  Eyes: Pupils are equal, round, and reactive to light. Conjunctivae, EOM and lids are normal.  Neck: No JVD present. Carotid bruit is not present. No edema present. No thyroid mass and no thyromegaly present.  Cardiovascular: S1 normal and S2 normal. Exam reveals no gallop.  Murmur heard.  Systolic murmur is present with a grade of 3/6. Pulses:      Dorsalis pedis pulses are 2+ on the right side and 2+ on the left side.  Respiratory: No respiratory distress. She has decreased breath sounds in the right lower field and the left lower  field. She has no wheezes. She has no rhonchi. She has no rales.  GI: Soft. Bowel sounds are normal. There is no abdominal tenderness.  Musculoskeletal:     Right ankle: She exhibits no swelling.     Left ankle: She exhibits no swelling.  Lymphadenopathy:    She has no cervical adenopathy.  Neurological: She is alert and oriented to person, place, and time. No cranial nerve deficit.  Skin: Skin is warm. No rash noted. Nails show no clubbing.  Psychiatric: She has a normal mood and affect.      Data Reviewed: Basic Metabolic Panel: Recent Labs  Lab 09/22/19 0719 09/22/19 1757 09/23/19 0443  NA 136  --  136  K 5.1  --  4.2  CL 101  --  99  CO2 21*  --  23  GLUCOSE 413*  --  155*  BUN 31*  --  35*  CREATININE 1.44*  --  1.33*  CALCIUM 9.2  --  9.0  MG  --  2.3 2.2  PHOS  --  5.9* 2.6   Liver Function Tests: Recent Labs  Lab 09/23/19 0443  AST 20  ALT 13  ALKPHOS 53  BILITOT 0.9  PROT 6.2*  ALBUMIN 3.7   CBC: Recent Labs  Lab 09/22/19 0719 09/23/19 0443  WBC 12.1* 9.1  NEUTROABS 8.0* 8.0*  HGB 14.9 11.6*  HCT 44.4 33.3*  MCV 94.1 91.0  PLT 342 216   BNP (last 3 results)  Recent Labs    08/12/19 0752 08/24/19 0906 09/22/19 0720  BNP >4,500.0* 2,357.0* 1,885.0*     CBG: Recent Labs  Lab 09/22/19 2121 09/22/19 2325 09/23/19 0414 09/23/19 0726 09/23/19 1112  GLUCAP 179* 254* 158* 149* 185*    Recent Results (from the past 240 hour(s))  SARS CORONAVIRUS 2 (TAT 6-24 HRS) Nasopharyngeal Nasopharyngeal Swab     Status: None   Collection Time: 09/22/19  7:20 AM   Specimen: Nasopharyngeal Swab  Result Value Ref Range Status   SARS Coronavirus 2 NEGATIVE NEGATIVE Final    Comment: (NOTE) SARS-CoV-2 target nucleic acids are NOT DETECTED. The SARS-CoV-2 RNA is generally detectable in upper and lower respiratory specimens during the acute phase of infection. Negative results do not preclude SARS-CoV-2 infection, do not rule out co-infections with  other pathogens, and should not be used as the sole basis for treatment or other patient management decisions. Negative results must be combined with clinical observations, patient history, and epidemiological information. The expected result is Negative. Fact Sheet for Patients: SugarRoll.be Fact Sheet for Healthcare Providers: https://www.woods-mathews.com/ This test is not yet approved or cleared by the [place] FDA and  has been authorized for detection and/or diagnosis of SARS-CoV-2 by FDA under an Emergency Use Authorization (EUA). This EUA will remain  in effect (meaning this test can be used) for the duration of the COVID-19 declaration under Section 56 4(b)(1) of the Act, 21 U.S.C. section 360bbb-3(b)(1), unless the authorization is terminated or revoked sooner. Performed at [facility], [place] [address], [place], [place] [address]   MRSA PCR Screening     Status: None   Collection Time: 09/22/19  9:17 PM   Specimen: Nasal Mucosa; Nasopharyngeal  Result Value Ref Range Status   MRSA by PCR NEGATIVE NEGATIVE Final    Comment:        The GeneXpert MRSA Assay (FDA approved for NASAL specimens only), is one component of a comprehensive MRSA colonization surveillance program. It is not intended to diagnose MRSA infection nor to guide or monitor treatment for MRSA infections. Performed at [facility], [address] [place], [place] [address]      Studies: Dg Chest 2 View  Result Date: 09/22/2019 CLINICAL DATA:  Shortness of breath, cough. EXAM: CHEST - 2 VIEW COMPARISON:  Same day. FINDINGS: Stable cardiomegaly. Status post coronary bypass graft. No pneumothorax is noted. Right-sided pacemaker is unchanged in position. Stable diffuse interstitial densities are noted throughout both lungs concerning for pulmonary edema with probable small bilateral pleural effusions. Bony thorax is unremarkable. IMPRESSION:  Stable cardiomegaly with bilateral pulmonary edema and probable small bilateral pleural effusions. Electronically Signed   By: Marijo Conception M.D.   On: 09/22/2019 13:35   Dg Chest Portable 1 View  Result Date: 09/22/2019 CLINICAL DATA:  Admitted to [facility] for shortness of breath, initially weaned off BiPAP, now worsening EXAM: PORTABLE CHEST 1 VIEW COMPARISON:  Radiograph 09/22/2019 FINDINGS: Cardiac pacer pack overlies the right chest wall with leads at the right atrium, coronary sinus and cardiac apex. Postsurgical changes related to prior CABG including intact and aligned sternotomy wires and multiple surgical clips projecting over the mediastinum. Distal left clavicular plate screw fixation hardware is noted. Degenerative changes are present in the imaged spine and shoulders. Worsening bilateral interstitial and airspace opacities most pronounced in the right lung. Associated septal thickening is present with central vascular cuffing. Mild cardiomegaly, similar to prior accounting for differences in technique. No pneumothorax. No visible effusion.  IMPRESSION: Worsening bilateral interstitial and airspace opacities, most pronounced in the right lung, consistent with worsening pulmonary edema. Superimposed infection cannot be excluded. Electronically Signed   By: Lovena Le M.D.   On: 09/22/2019 18:16   Dg Chest Portable 1 View  Result Date: 09/22/2019 CLINICAL DATA:  Shortness of breath EXAM: PORTABLE CHEST 1 VIEW COMPARISON:  August 25, 2019 FINDINGS: There is cardiomegaly with pulmonary venous hypertension. There is generalized interstitial edema. There is atelectatic change in the right base and right upper lobe. There is no airspace consolidation. Pacemaker leads are attached to the right atrium, right ventricle, and coronary sinus. Patient is status post coronary artery bypass grafting. No adenopathy. There is aortic atherosclerosis. There is postoperative change in the left clavicle.  IMPRESSION: Cardiomegaly with pulmonary venous hypertension. There is interstitial pulmonary edema. The appearance is felt to be indicative of a degree of congestive heart failure. Areas of atelectatic change on the right. No consolidation. Pacemaker leads attached to right atrium, right ventricle, and coronary sinus. Status post coronary artery bypass grafting. Aortic Atherosclerosis (ICD10-I70.0). Electronically Signed   By: Lowella Grip III M.D.   On: 09/22/2019 07:49    Scheduled Meds: . ALPRAZolam  0.25-0.5 mg Oral QHS  . aspirin EC  81 mg Oral Daily  . atorvastatin  40 mg Oral Daily  . budesonide (PULMICORT) nebulizer solution  0.5 mg Nebulization BID  . carvedilol  6.25 mg Oral BID WC  . Chlorhexidine Gluconate Cloth  6 each Topical Daily  . clopidogrel  75 mg Oral Daily  . escitalopram  10 mg Oral Daily  . [START ON 09/24/2019] furosemide  40 mg Intravenous Daily  . heparin  5,000 Units Subcutaneous Q8H  . insulin aspart  0-15 Units Subcutaneous Q4H  . ipratropium-albuterol  3 mL Nebulization Q6H  . [START ON 09/24/2019] predniSONE  20 mg Oral Q breakfast  . sacubitril-valsartan  1 tablet Oral BID  . sodium chloride flush  3 mL Intravenous Q12H  . traZODone  25-50 mg Oral QHS    Assessment/Plan:  1. Acute hypoxic respiratory failure.  The patient does not wear any oxygen at home.  See if we can taper off.  Initially she required BiPAP.  Patient on IV Lasix.  We will get an overnight oximetry to see if she is hypoxic at night and requires oxygen at night.  Will need an outpatient sleep study. 2. Acute on chronic systolic congestive heart failure.  Patient has moderate mitral stenosis and moderate mitral regurgitation.  Patient on IV Lasix, Coreg, Entresto.  Cardiology has referred back to [facility] for further evaluation.  Patient usually improves pretty quickly.  3. COPD.  Patient does not wheezing at this time.  Patient started on prednisone.  Continue nebulizers. 4. Hypertensive  urgency on presentation.  Now off nitroglycerin drip.  Continue Entresto and Coreg and Lasix. 5. Chronic kidney disease stage IIIa.  Watch with diuresis. 6. Hyperlipidemia on Lipitor 7. Depression on Lexapro  Code Status:     Code Status Orders  (From admission, onward)         Start     Ordered   09/22/19 1138  Full code  Continuous     09/22/19 1140        Code Status History    Date Active Date Inactive Code Status Order ID Comments User Context   08/24/2019 1329 08/27/2019 1738 Full Code [id number]  Saundra Shelling, MD Inpatient   08/12/2019 0953 08/15/2019 1503 Full Code [id number]  Posey Pronto,  Chana Bode, MD ED   07/16/2019 1508 07/19/2019 1445 Full Code [id number]  Hillary Bow, MD ED   06/19/2019 1430 06/21/2019 1804 Full Code [id number]  Henreitta Leber, MD ED   05/27/2019 0630 05/31/2019 2111 Full Code [id number]  Mansy, Arvella Merles, MD ED   03/16/2019 1354 03/17/2019 1617 Full Code [id number]  Gorden Harms, MD Inpatient   07/17/2018 1214 07/19/2018 1548 Full Code [id number]  Dustin Flock, MD ED   10/23/2016 0917 10/23/2016 1630 Full Code [id number]  Algernon Huxley, MD Inpatient   06/21/2016 0437 06/21/2016 1758 Full Code [id number]  Harrie Foreman, MD Inpatient   Advance Care Planning Activity      Disposition Plan: Potentially home in the next day or so.  Cardiology looking into appointments at [facility] for her.  Consultants:  Cardiology  Time spent: 28 minutes  [place]

## 2019-09-24 DIAGNOSIS — I25700 Atherosclerosis of coronary artery bypass graft(s), unspecified, with unstable angina pectoris: Secondary | ICD-10-CM

## 2019-09-24 LAB — CBC WITH DIFFERENTIAL/PLATELET
Abs Immature Granulocytes: 0.08 10*3/uL — ABNORMAL HIGH (ref 0.00–0.07)
Basophils Absolute: 0 10*3/uL (ref 0.0–0.1)
Basophils Relative: 0 %
Eosinophils Absolute: 0 10*3/uL (ref 0.0–0.5)
Eosinophils Relative: 0 %
HCT: 32 % — ABNORMAL LOW (ref 36.0–46.0)
Hemoglobin: 10.9 g/dL — ABNORMAL LOW (ref 12.0–15.0)
Immature Granulocytes: 1 %
Lymphocytes Relative: 11 %
Lymphs Abs: 1.1 10*3/uL (ref 0.7–4.0)
MCH: 31.6 pg (ref 26.0–34.0)
MCHC: 34.1 g/dL (ref 30.0–36.0)
MCV: 92.8 fL (ref 80.0–100.0)
Monocytes Absolute: 0.7 10*3/uL (ref 0.1–1.0)
Monocytes Relative: 7 %
Neutro Abs: 8.3 10*3/uL — ABNORMAL HIGH (ref 1.7–7.7)
Neutrophils Relative %: 81 %
Platelets: 219 10*3/uL (ref 150–400)
RBC: 3.45 MIL/uL — ABNORMAL LOW (ref 3.87–5.11)
RDW: 15.2 % (ref 11.5–15.5)
WBC: 10.1 10*3/uL (ref 4.0–10.5)
nRBC: 0 % (ref 0.0–0.2)

## 2019-09-24 LAB — GLUCOSE, CAPILLARY
Glucose-Capillary: 148 mg/dL — ABNORMAL HIGH (ref 70–99)
Glucose-Capillary: 152 mg/dL — ABNORMAL HIGH (ref 70–99)
Glucose-Capillary: 237 mg/dL — ABNORMAL HIGH (ref 70–99)

## 2019-09-24 LAB — BASIC METABOLIC PANEL
Anion gap: 17 — ABNORMAL HIGH (ref 5–15)
BUN: 47 mg/dL — ABNORMAL HIGH (ref 8–23)
CO2: 24 mmol/L (ref 22–32)
Calcium: 9.3 mg/dL (ref 8.9–10.3)
Chloride: 95 mmol/L — ABNORMAL LOW (ref 98–111)
Creatinine, Ser: 1.26 mg/dL — ABNORMAL HIGH (ref 0.44–1.00)
GFR calc Af Amer: 51 mL/min — ABNORMAL LOW (ref >60)
GFR calc non Af Amer: 44 mL/min — ABNORMAL LOW (ref >60)
Glucose, Bld: 149 mg/dL — ABNORMAL HIGH (ref 70–99)
Potassium: 4.2 mmol/L (ref 3.5–5.1)
Sodium: 136 mmol/L (ref 135–145)

## 2019-09-24 MED ORDER — FUROSEMIDE 40 MG PO TABS: 40.0000 mg | ORAL_TABLET | Freq: Two times a day (BID) | ORAL | 0 refills | Status: DC

## 2019-09-24 MED ORDER — FUROSEMIDE 20 MG PO TABS
40.0000 mg | ORAL_TABLET | Freq: Every day | ORAL | Status: DC
  Administered 2019-09-24: 40 mg via ORAL
  Filled 2019-09-24: qty 2

## 2019-09-24 NOTE — Plan of Care (Signed)

## 2019-09-24 NOTE — Progress Notes (Signed)
Went over discharge instructions with pt. She verbally acknowledged and understood them and had no questions. Pt is currently resting comfortably in room waiting on ride. IV was removed and once ride arrives will escort pt down to medical mall via wheelchair.

## 2019-09-24 NOTE — Progress Notes (Signed)
[facility] Cardiology  SUBJECTIVE: Patient continues to feel better, denies chest pain or shortness of breath   Vitals:   09/24/19 0400 09/24/19 0500 09/24/19 0600 09/24/19 0700  BP: 104/76 109/73    Pulse: 74 76 76 72  Resp: 17 17 18    Temp:      TempSrc:      SpO2: 92% 93% 96% 91%  Weight:  75.4 kg    Height:         Intake/Output Summary (Last 24 hours) at 09/24/2019 0837 Last data filed at 09/24/2019 0600 Gross per 24 hour  Intake 963 ml  Output 1000 ml  Net -37 ml      PHYSICAL EXAM  General: Well developed, well nourished, in no acute distress HEENT:  Normocephalic and atramatic Neck:  No JVD.  Lungs: Clear bilaterally to auscultation and percussion. Heart: HRRR . Normal S1 and S2 without gallops or murmurs.  Abdomen: Bowel sounds are positive, abdomen soft and non-tender  Msk:  Back normal, normal gait. Normal strength and tone for age. Extremities: No clubbing, cyanosis or edema.   Neuro: Alert and oriented X 3. Psych:  Good affect, responds appropriately   LABS: Basic Metabolic Panel: Recent Labs    09/22/19 1757 09/23/19 0443 09/24/19 0611  NA  --  136 136  K  --  4.2 4.2  CL  --  99 95*  CO2  --  23 24  GLUCOSE  --  155* 149*  BUN  --  35* 47*  CREATININE  --  1.33* 1.26*  CALCIUM  --  9.0 9.3  MG 2.3 2.2  --   PHOS 5.9* 2.6  --    Liver Function Tests: Recent Labs    09/23/19 0443  AST 20  ALT 13  ALKPHOS 53  BILITOT 0.9  PROT 6.2*  ALBUMIN 3.7   No results for input(s): LIPASE, AMYLASE in the last 72 hours. CBC: Recent Labs    09/23/19 0443 09/24/19 0611  WBC 9.1 10.1  NEUTROABS 8.0* 8.3*  HGB 11.6* 10.9*  HCT 33.3* 32.0*  MCV 91.0 92.8  PLT 216 219   Cardiac Enzymes: No results for input(s): CKTOTAL, CKMB, CKMBINDEX, TROPONINI in the last 72 hours. BNP: Invalid input(s): POCBNP D-Dimer: No results for input(s): DDIMER in the last 72 hours. Hemoglobin A1C: No results for input(s): HGBA1C in the last 72 hours. Fasting  Lipid Panel: No results for input(s): CHOL, HDL, LDLCALC, TRIG, CHOLHDL, LDLDIRECT in the last 72 hours. Thyroid Function Tests: Recent Labs    09/23/19 0443  TSH 0.725   Anemia Panel: Recent Labs    09/23/19 0443  VITAMINB12 2,276*  FOLATE 5.4*  FERRITIN 50  TIBC 386  IRON 57  RETICCTPCT 2.5    Dg Chest 2 View  Result Date: 09/22/2019 CLINICAL DATA:  Shortness of breath, cough. EXAM: CHEST - 2 VIEW COMPARISON:  Same day. FINDINGS: Stable cardiomegaly. Status post coronary bypass graft. No pneumothorax is noted. Right-sided pacemaker is unchanged in position. Stable diffuse interstitial densities are noted throughout both lungs concerning for pulmonary edema with probable small bilateral pleural effusions. Bony thorax is unremarkable. IMPRESSION: Stable cardiomegaly with bilateral pulmonary edema and probable small bilateral pleural effusions. Electronically Signed   By: Marijo Conception M.D.   On: 09/22/2019 13:35   Dg Chest Portable 1 View  Result Date: 09/22/2019 CLINICAL DATA:  Admitted to [facility] for shortness of breath, initially weaned off BiPAP, now worsening EXAM: PORTABLE CHEST 1 VIEW COMPARISON:  Radiograph  09/22/2019 FINDINGS: Cardiac pacer pack overlies the right chest wall with leads at the right atrium, coronary sinus and cardiac apex. Postsurgical changes related to prior CABG including intact and aligned sternotomy wires and multiple surgical clips projecting over the mediastinum. Distal left clavicular plate screw fixation hardware is noted. Degenerative changes are present in the imaged spine and shoulders. Worsening bilateral interstitial and airspace opacities most pronounced in the right lung. Associated septal thickening is present with central vascular cuffing. Mild cardiomegaly, similar to prior accounting for differences in technique. No pneumothorax. No visible effusion. IMPRESSION: Worsening bilateral interstitial and airspace opacities, most pronounced in the  right lung, consistent with worsening pulmonary edema. Superimposed infection cannot be excluded. Electronically Signed   By: Lovena Le M.D.   On: 09/22/2019 18:16     Echo LVEF 40 to 45%, moderate severe MR  TELEMETRY: Sinus rhythm:  ASSESSMENT AND PLAN:  Principal Problem:   Respiratory failure with hypoxia (HCC) Active Problems:   CAD (coronary artery disease)   Tubular adenoma of colon   Personal history of colon cancer   Prediabetes   COPD with emphysema (HCC)   HTN (hypertension)   Acute on chronic systolic CHF (congestive heart failure) (HCC)   Acute respiratory failure (HCC)   Anemia   Mitral stenosis with insufficiency   Hypertensive urgency   Chronic renal failure, stage 3a   Depression    1.  Respiratory failure, multifactorial, secondary to COPD exacerbation, moderate to severe pulmonary hypertension, and acute on chronic systolic congestive heart failure.  Patient clinically much improved after diuresis, BiPAP, IV steroids and nebulizer therapy.  Patient currently not requiring oxygen supplementation. 2.  Acute on chronic systolic congestive heart failure, with known dilated cardiomyopathy, with LVEF 20-25%, status post BiV ICD 3.  Coronary artery disease, status post CABG, with recent cardiac catheterization 08/26/2019 revealing patent LIMA to LAD, chronically occluded SVG to OM and PDA, without chest pain, with nondiagnostic ECG, with borderline elevated troponin, without significant delta (19, 19, 28, 25, 22) 4.  Moderate mitral stenosis, moderate to severe mitral regurgitation, of uncertain clinical significance 5.  Flash pulmonary edema secondary to hypertensive emergency, initially required nitroglycerin drip, CPAP, Ativan, intravenous Lasix, now resolved  Recommendations  1.  Agree with current therapy 2.  Continue dual antiplatelet therapy 3.  Continue carvedilol, Entresto 4.  Resume maintenance furosemide 40 mg daily 5.  Defer further cardiac  diagnostics at this time 6.  Patient appears to be stable for discharge from cardiovascular perspective 7.  Follow-up as outpatient in 1 week 8.  Outpatient cardiac MRI at [facility] to be arranged to further delineate atrial valve disease  Sign off for now, please call if any questions   Isaias Cowman, MD, PhD, [facility] 09/24/2019 8:37 AM

## 2019-09-24 NOTE — Discharge Summary (Signed)
[place] [facility] [place] NAME: Ashley [name]    MR#:  [id number]  DATE OF BIRTH:  [birth year]  DATE OF ADMISSION:  09/22/2019 ADMITTING PHYSICIAN: Para Skeans, MD  DATE OF DISCHARGE: 09/24/2019  PRIMARY CARE PHYSICIAN: [name], MD    ADMISSION DIAGNOSIS:  Acute respiratory failure with hypoxia (HCC) [J96.01] Acute on chronic congestive heart failure, unspecified heart failure type ([place]) [I50.9]  DISCHARGE DIAGNOSIS:  Acute respiratory failure secondary to flash pulmonary edema in the setting of acute on chronic systolic congestive heart failure and MS/MR  SECONDARY DIAGNOSIS:   Past Medical History:  Diagnosis Date  . AICD (automatic cardioverter/defibrillator) present    on right side  . Anemia   . CAD (coronary artery disease)    s/p CABG  . CHF (congestive heart failure) ([place])   . Chronic kidney disease    renal artery stenosis  . Colon cancer ([place])   . Depression   . GERD (gastroesophageal reflux disease)   . Hx of colonic polyps   . Hyperlipidemia   . Hypertension   . Mitral valve disorder    s/p mitral valve repair wth CABG  . Myocardial infarction ([place])   . Peripheral vascular disease ([place])   . Presence of permanent cardiac pacemaker    Pacemaker/ Defibrillator  . Tobacco abuse 10/11/2014    HOSPITAL COURSE:   68 year old female with past medical history of coronary artery disease, CKD stage III, previous history of AICD, hypertension, hyperlipidemia, history of colon cancer who presented to the hospital due to shortness of breath.  1. Acute respiratory failure with hypoxia-secondary to CHF acute on chronic systolic in the setting of severe mitral valve regurgitation with history of mitral valve stenosisand CAD -Dr. Saralyn Pilar input appreciated-- recommends continue Lasix, cardiac MRI to be done at [facility]. - pt is s/p cath in October 20201. Severe three-vessel coronary artery disease  with 90% stenosis proximal LAD, 100% stenosis mid left circumflex, 100% stenosis proximal RCA 2. Patent LIMA to LAD, occluded SVG to distal RCA and OM1 3. Dilated cardiomyopathy with severely reduced left ventricular function, LVEF 20 to 25% 4. Moderate to severe pulmonary hypertension 5. Moderate mitral regurgitation 6. Moderate mitral stenosis -she will follow-up closely with cardiology as outpatient. -Recently Lasix was increased to 40 mg three times a day by heart failure clinic RN Darylene Price--- will discharge patient on Lasix 40 mg BID till she sees Dr. Saralyn Pilar and defer further dosing per cardiology. -Patient agreeable  2. CHF-acute on chronic systolic dysfunction. Patient's previous echocardiogram showed EF of 45%-- now EF 20-25% per cath in oct 2020 -Patient was diuresed with IV Lasix and responded well to it.  -Patient will continue her oral Lasix, Entresto, carvedilol and Aldactone - recs from Dr Parachos--Cardiac MRI will be scheduled at [facility]  3. COPD stable -continue PRN nebs and inhalers  4. Hyperlipidemia - cont. Atorvastatin.  5. Depression - cont. Lexapro.   6. GERD - Pt. Will cont. Omeprazole  Patient feels well and wants to go home. Her sats are 96% on room air. Denies any chest pain not in respiratory distress CONSULTS OBTAINED:  Treatment Team:  [facility], [facility], MD  DRUG ALLERGIES:   Allergies  Allergen Reactions  . Vancomycin Nausea And Vomiting and Palpitations  . Hydrocodone-Acetaminophen Nausea Only    DISCHARGE MEDICATIONS:   Allergies as of 09/24/2019      Reactions   Vancomycin Nausea And Vomiting, Palpitations   Hydrocodone-acetaminophen  Nausea Only      Medication List    STOP taking these medications   acetaminophen 500 MG tablet Commonly known as: TYLENOL     TAKE these medications   albuterol 108 (90 Base) MCG/ACT inhaler Commonly known as: Ventolin HFA INHALE 2 PUFFS BY MOUTH INTO THE LUNGS EVERY 6 HOURS AS  NEEDED FOR WHEEZING What changed:   how much to take  how to take this  when to take this  reasons to take this  additional instructions   ALPRAZolam 0.5 MG tablet Commonly known as: XANAX Take 0.5-1 tablets (0.25-0.5 mg total) by mouth at bedtime. TAKE ONE TABLET BY MOUTH AT BEDTIME AS NEEDED FOR ANXIETY What changed: additional instructions   aspirin 81 MG tablet Take 81 mg by mouth daily.   atorvastatin 40 MG tablet Commonly known as: LIPITOR TAKE ONE TABLET BY MOUTH ONCE DAILY   carvedilol 6.25 MG tablet Commonly known as: COREG Take 1 tablet (6.25 mg total) by mouth 2 (two) times daily with a meal.   clopidogrel 75 MG tablet Commonly known as: PLAVIX Take 1 tablet (75 mg total) by mouth daily.   cyanocobalamin 1000 MCG/ML injection Commonly known as: (VITAMIN B-12) Inject 1 mL into the muscle monthly   Diphenhist 25 mg capsule Generic drug: diphenhydrAMINE Take 25 mg by mouth at bedtime as needed for allergies or sleep.   Entresto 24-26 MG Generic drug: sacubitril-valsartan Take 1 tablet by mouth 2 (two) times daily.   escitalopram 10 MG tablet Commonly known as: LEXAPRO Take 1 tablet (10 mg total) by mouth daily.   ferrous sulfate 325 (65 FE) MG EC tablet Take 325 mg by mouth daily with breakfast.   furosemide 40 MG tablet Commonly known as: LASIX Take 1 tablet (40 mg total) by mouth 2 (two) times daily. What changed: when to take this   ipratropium-albuterol 0.5-2.5 (3) MG/3ML Soln Commonly known as: DUONEB Take 3 mLs by nebulization every 6 (six) hours as needed.   nitroGLYCERIN 0.4 MG SL tablet Commonly known as: NITROSTAT Place 1 tablet (0.4 mg total) under the tongue every 5 (five) minutes as needed for chest pain.   omeprazole 40 MG capsule Commonly known as: PRILOSEC Take 1 capsule (40 mg total) by mouth daily.   potassium chloride 10 MEQ tablet Commonly known as: KLOR-CON Take 1 tablet (10 mEq total) by mouth daily.    spironolactone 25 MG tablet Commonly known as: Aldactone Take 0.5 tablets (12.5 mg total) by mouth daily.   traZODone 50 MG tablet Commonly known as: DESYREL Take 0.5-1 tablets (25-50 mg total) by mouth daily. One hour before bedtime What changed: when to take this   Trelegy Ellipta 100-62.5-25 MCG/INH Aepb Generic drug: Fluticasone-Umeclidin-Vilant Inhale 1 puff into the lungs daily.       If you experience worsening of your admission symptoms, develop shortness of breath, life threatening emergency, suicidal or homicidal thoughts you must seek medical attention immediately by calling 911 or calling your MD immediately  if symptoms less severe.  You Must read complete instructions/literature along with all the possible adverse reactions/side effects for all the Medicines you take and that have been prescribed to you. Take any new Medicines after you have completely understood and accept all the possible adverse reactions/side effects.   Please note  You were cared for by a hospitalist during your hospital stay. If you have any questions about your discharge medications or the care you received while you were in the hospital after you  are discharged, you can call the unit and asked to speak with the hospitalist on call if the hospitalist that took care of you is not available. Once you are discharged, your primary care physician will handle any further medical issues. Please note that NO REFILLS for any discharge medications will be authorized once you are discharged, as it is imperative that you return to your primary care physician (or establish a relationship with a primary care physician if you do not have one) for your aftercare needs so that they can reassess your need for medications and monitor your lab values. Today   SUBJECTIVE   Feels better. Asking me if she can go home  VITAL SIGNS:  Blood pressure 109/68, pulse 77, temperature 98.5 F (36.9 C), temperature source Oral,  resp. rate 19, height 5\' 4"  (1.626 m), weight 75.4 kg, SpO2 98 %.  I/O:    Intake/Output Summary (Last 24 hours) at 09/24/2019 1208 Last data filed at 09/24/2019 0934 Gross per 24 hour  Intake 1083 ml  Output 1000 ml  Net 83 ml    PHYSICAL EXAMINATION:  GENERAL:  68 y.o.-year-old patient lying in the bed with no acute distress.  EYES: Pupils equal, round, reactive to light and accommodation. No scleral icterus. Extraocular muscles intact.  HEENT: Head atraumatic, normocephalic. Oropharynx and nasopharynx clear.  NECK:  Supple, no jugular venous distention. No thyroid enlargement, no tenderness.  LUNGS: Normal breath sounds bilaterally, no wheezing, rales,rhonchi or crepitation. No use of accessory muscles of respiration.  CARDIOVASCULAR: S1, S2 normal. No murmurs, rubs, or gallops.  ABDOMEN: Soft, non-tender, non-distended. Bowel sounds present. No organomegaly or mass.  EXTREMITIES: No pedal edema, cyanosis, or clubbing.  NEUROLOGIC: Cranial nerves II through XII are intact. Muscle strength 5/5 in all extremities. Sensation intact. Gait not checked.  PSYCHIATRIC: The patient is alert and oriented x 3.  SKIN: No obvious rash, lesion, or ulcer.   DATA REVIEW:   CBC  Recent Labs  Lab 09/24/19 0611  WBC 10.1  HGB 10.9*  HCT 32.0*  PLT 219    Chemistries  Recent Labs  Lab 09/23/19 0443 09/24/19 0611  NA 136 136  K 4.2 4.2  CL 99 95*  CO2 23 24  GLUCOSE 155* 149*  BUN 35* 47*  CREATININE 1.33* 1.26*  CALCIUM 9.0 9.3  MG 2.2  --   AST 20  --   ALT 13  --   ALKPHOS 53  --   BILITOT 0.9  --     Microbiology Results   Recent Results (from the past 240 hour(s))  SARS CORONAVIRUS 2 (TAT 6-24 HRS) Nasopharyngeal Nasopharyngeal Swab     Status: None   Collection Time: 09/22/19  7:20 AM   Specimen: Nasopharyngeal Swab  Result Value Ref Range Status   SARS Coronavirus 2 NEGATIVE NEGATIVE Final    Comment: (NOTE) SARS-CoV-2 target nucleic acids are NOT  DETECTED. The SARS-CoV-2 RNA is generally detectable in upper and lower respiratory specimens during the acute phase of infection. Negative results do not preclude SARS-CoV-2 infection, do not rule out co-infections with other pathogens, and should not be used as the sole basis for treatment or other patient management decisions. Negative results must be combined with clinical observations, patient history, and epidemiological information. The expected result is Negative. Fact Sheet for Patients: SugarRoll.be Fact Sheet for Healthcare Providers: https://www.woods-mathews.com/ This test is not yet approved or cleared by the [place] FDA and  has been authorized for detection and/or diagnosis of SARS-CoV-2  by FDA under an Emergency Use Authorization (EUA). This EUA will remain  in effect (meaning this test can be used) for the duration of the COVID-19 declaration under Section 56 4(b)(1) of the Act, 21 U.S.C. section 360bbb-3(b)(1), unless the authorization is terminated or revoked sooner. Performed at [facility], [place] [address], [place], [place] [address]   MRSA PCR Screening     Status: None   Collection Time: 09/22/19  9:17 PM   Specimen: Nasal Mucosa; Nasopharyngeal  Result Value Ref Range Status   MRSA by PCR NEGATIVE NEGATIVE Final    Comment:        The GeneXpert MRSA Assay (FDA approved for NASAL specimens only), is one component of a comprehensive MRSA colonization surveillance program. It is not intended to diagnose MRSA infection nor to guide or monitor treatment for MRSA infections. Performed at [facility], [address], [place], [place] [address]     RADIOLOGY:  Dg Chest 2 View  Result Date: 09/22/2019 CLINICAL DATA:  Shortness of breath, cough. EXAM: CHEST - 2 VIEW COMPARISON:  Same day. FINDINGS: Stable cardiomegaly. Status post coronary bypass graft. No pneumothorax is noted. Right-sided  pacemaker is unchanged in position. Stable diffuse interstitial densities are noted throughout both lungs concerning for pulmonary edema with probable small bilateral pleural effusions. Bony thorax is unremarkable. IMPRESSION: Stable cardiomegaly with bilateral pulmonary edema and probable small bilateral pleural effusions. Electronically Signed   By: Marijo Conception M.D.   On: 09/22/2019 13:35   Dg Chest Portable 1 View  Result Date: 09/22/2019 CLINICAL DATA:  Admitted to [facility] for shortness of breath, initially weaned off BiPAP, now worsening EXAM: PORTABLE CHEST 1 VIEW COMPARISON:  Radiograph 09/22/2019 FINDINGS: Cardiac pacer pack overlies the right chest wall with leads at the right atrium, coronary sinus and cardiac apex. Postsurgical changes related to prior CABG including intact and aligned sternotomy wires and multiple surgical clips projecting over the mediastinum. Distal left clavicular plate screw fixation hardware is noted. Degenerative changes are present in the imaged spine and shoulders. Worsening bilateral interstitial and airspace opacities most pronounced in the right lung. Associated septal thickening is present with central vascular cuffing. Mild cardiomegaly, similar to prior accounting for differences in technique. No pneumothorax. No visible effusion. IMPRESSION: Worsening bilateral interstitial and airspace opacities, most pronounced in the right lung, consistent with worsening pulmonary edema. Superimposed infection cannot be excluded. Electronically Signed   By: Lovena Le M.D.   On: 09/22/2019 18:16     CODE STATUS:     Code Status Orders  (From admission, onward)         Start     Ordered   09/22/19 1138  Full code  Continuous     09/22/19 1140        Code Status History    Date Active Date Inactive Code Status Order ID Comments User Context   08/24/2019 1329 08/27/2019 1738 Full Code [id number]  Saundra Shelling, MD Inpatient   08/12/2019 0953 08/15/2019 1503 Full  Code [id number]  Dustin Flock, MD ED   07/16/2019 1508 07/19/2019 1445 Full Code [id number]  Hillary Bow, MD ED   06/19/2019 1430 06/21/2019 1804 Full Code [id number]  Henreitta Leber, MD ED   05/27/2019 0630 05/31/2019 2111 Full Code [id number]  Mansy, Arvella Merles, MD ED   03/16/2019 1354 03/17/2019 1617 Full Code [id number]  Salary, Avel Peace, MD Inpatient   07/17/2018 1214 07/19/2018 1548 Full Code [id number]  Dustin Flock, MD ED  10/23/2016 0917 10/23/2016 1630 Full Code [id number]  Algernon Huxley, MD Inpatient   06/21/2016 0437 06/21/2016 1758 Full Code [id number]  Harrie Foreman, MD Inpatient   Advance Care Planning Activity      TOTAL TIME TAKING CARE OF THIS PATIENT: 40 minutes.    Fritzi Mandes M.D on 09/24/2019 at 12:08 PM  Between 7am to 6pm - Pager - [phone] After 6pm go to [email or web address] - password [email or web address]  [facility]    CC: Primary care physician; [name], MD

## 2019-09-24 NOTE — Discharge Instructions (Signed)
Patient will continue taking Lasix 40 mg BID for now till she follows up with Dr. Saralyn Pilar

## 2019-09-25 ENCOUNTER — Ambulatory Visit: Payer: Medicare Other | Admitting: Pharmacist

## 2019-09-25 ENCOUNTER — Other Ambulatory Visit: Payer: Self-pay | Admitting: *Deleted

## 2019-09-25 ENCOUNTER — Encounter: Payer: Self-pay | Admitting: *Deleted

## 2019-09-25 ENCOUNTER — Telehealth: Payer: Self-pay

## 2019-09-25 DIAGNOSIS — I5043 Acute on chronic combined systolic (congestive) and diastolic (congestive) heart failure: Secondary | ICD-10-CM

## 2019-09-25 DIAGNOSIS — J9611 Chronic respiratory failure with hypoxia: Secondary | ICD-10-CM

## 2019-09-25 NOTE — Patient Instructions (Signed)
Visit Information  Goals Addressed            This Visit's Progress     Patient Stated   . "I want to stay out of the hospital" (pt-stated)       Current Barriers:  . Polypharmacy; complex patient with multiple comorbidities including HFrEF, COPD, CAD (hx NSTEMI, s/p CABG x3 and mitral valve repair in 2007), continued severe mitral valve regurgitation; barrett's esophagus, depression . Admitted at [facility] 11/16-11/18 for Respiratory failure/CHF exacerbation.  o Discharged on furosemide 40 mg BID; admitted on TID. Patient concerned about fluid gain on this dose. Notes current weight 162 lbs. . Self-manages medications by filling a BID pill box. Has Medicare Extra Help- all brand medications $8.95 and generic $3.60 for 30 or 90 day supply o COPD: Tx Trelegy daily + albuterol HFA PRN or Duonebs PRN; follows w/ Dr. Patsey Berthold; notes she was last prescribed a ProAir Respiclick, notes she prefers the MDI inhaler better o CHF: HFrEF/CAD/mitral valve; managed on Entresto 49/51 BID, spironolactone 12.5 mg BID, carvedilol 3.125 mg BID, furosemide 40 mg BID follows w/ Dr. Saralyn Pilar and HF clinic. Awaiting cardiac MRI to further delineate valvular disease o Insomnia: notes taking trazodone 50 mg QPM, alprazolam 0.25 mg QPM, and diphenhydramine 25 mg QPM for sleep.  o Hx tobacco abuse: patient has been TOBACCO FREE since 06/19/2019.  o Back pain: patient notes throbbing, bilateral lower back pain (end of spine to top of hips) since discharge; relieved by propping on pillows and not moving. Wanted to take APAP PRN, though APAP was d/c on discharge med rev, so she was concerned and want to check and see if it was safe to take firs t  Pharmacist Clinical Goal(s):  [name] Over the next 90 days, patient will work with PharmD and provider towards optimized medication management  Interventions: . Comprehensive medication review performed; medication list updated in electronic medical record . Collaborated w/ Reginia Naas, RN CM. Richarda Osmond collaborated w/ [facility] Cards; patient amenable to cardiac MRI scheduling at [facility], whichever is sooner.  . Reviewed upcoming appt w/ HF clinic on Monday . Encouraged patient to ask the pharmacy to dispense a different version of albuterol next time, if others covered by her pharmacy (generic Ventolin HFA, Proair, Proventil vs Respiclick) . No liver disease. Likely APAP was removed to clean up her medication list, not because there was a specific reason she shouldn't take it. Messaged Dr. Derrel Nip to discuss. Also recommended heat therapy to patient.   Patient Self Care Activities:  . Patient will take medications as prescribed . Patient will attend follow up appointments as scheduled.   Please see past updates related to this goal by clicking on the "Past Updates" button in the selected goal         The patient verbalized understanding of instructions provided today and declined a print copy of patient instruction materials.   Plan: - Will continue to collaborate w/ interdiscplinary team - Will f/u with patient over the next 3-4 weeks  [name] Darnelle Maffucci, PharmD, [place] Pharmacist [place] [phone]

## 2019-09-25 NOTE — Chronic Care Management (AMB) (Signed)
Chronic Care Management   Follow Up Note   09/25/2019 Name: Ashley [name] MRN: [id number] DOB: 1951/03/11  Referred by: [name], MD Reason for referral : Chronic Care Management (Medication Management )   Ashley [name] is a 68 y.o. year old female who is a primary care patient of [name], [name], MD. The CCM team was consulted for assistance with chronic disease management and care coordination needs.    Contacted patient for medication management review today.   Review of patient status, including review of consultants reports, relevant laboratory and other test results, and collaboration with appropriate care team members and the patient's provider was performed as part of comprehensive patient evaluation and provision of chronic care management services.    SDOH (Social Determinants of Health) screening performed today: Stress. See Care Plan for related entries.   Outpatient Encounter Medications as of 09/25/2019  Medication Sig Note  . ALPRAZolam (XANAX) 0.5 MG tablet Take 0.5-1 tablets (0.25-0.5 mg total) by mouth at bedtime. TAKE ONE TABLET BY MOUTH AT BEDTIME AS NEEDED FOR ANXIETY (Patient taking differently: Take 0.25-0.5 mg by mouth at bedtime. ) 09/25/2019: 1 QPM  . aspirin 81 MG tablet Take 81 mg by mouth daily.   [name] atorvastatin (LIPITOR) 40 MG tablet TAKE ONE TABLET BY MOUTH ONCE DAILY   . carvedilol (COREG) 6.25 MG tablet Take 1 tablet (6.25 mg total) by mouth 2 (two) times daily with a meal.   . clopidogrel (PLAVIX) 75 MG tablet Take 1 tablet (75 mg total) by mouth daily.   . cyanocobalamin (,VITAMIN B-12,) 1000 MCG/ML injection Inject 1 mL into the muscle monthly   . diphenhydrAMINE (DIPHENHIST) 25 mg capsule Take 25 mg by mouth at bedtime as needed for allergies or sleep.    [name] escitalopram (LEXAPRO) 10 MG tablet Take 1 tablet (10 mg total) by mouth daily.   . ferrous sulfate 325 (65 FE) MG EC tablet Take 325 mg by mouth daily with breakfast.   .  Fluticasone-Umeclidin-Vilant (TRELEGY ELLIPTA) 100-62.5-25 MCG/INH AEPB Inhale 1 puff into the lungs daily.   . furosemide (LASIX) 40 MG tablet Take 1 tablet (40 mg total) by mouth 2 (two) times daily.   [name] ipratropium-albuterol (DUONEB) 0.5-2.5 (3) MG/3ML SOLN Take 3 mLs by nebulization every 6 (six) hours as needed. 09/25/2019: QPM  . omeprazole (PRILOSEC) 40 MG capsule Take 1 capsule (40 mg total) by mouth daily.   . potassium chloride (KLOR-CON) 10 MEQ tablet Take 1 tablet (10 mEq total) by mouth daily.   . sacubitril-valsartan (ENTRESTO) 24-26 MG Take 1 tablet by mouth 2 (two) times daily.   [name] spironolactone (ALDACTONE) 25 MG tablet Take 0.5 tablets (12.5 mg total) by mouth daily.   . traZODone (DESYREL) 50 MG tablet Take 0.5-1 tablets (25-50 mg total) by mouth daily. One hour before bedtime 09/25/2019: 1 tab QPM  . albuterol (VENTOLIN HFA) 108 (90 Base) MCG/ACT inhaler INHALE 2 PUFFS BY MOUTH INTO THE LUNGS EVERY 6 HOURS AS NEEDED FOR WHEEZING (Patient not taking: Reported on 09/25/2019)   . nitroGLYCERIN (NITROSTAT) 0.4 MG SL tablet Place 1 tablet (0.4 mg total) under the tongue every 5 (five) minutes as needed for chest pain. (Patient not taking: Reported on 09/04/2019)    No facility-administered encounter medications on file as of 09/25/2019.      Goals Addressed            This Visit's Progress     Patient Stated   . "I want to  stay out of the hospital" (pt-stated)       Current Barriers:  . Polypharmacy; complex patient with multiple comorbidities including HFrEF, COPD, CAD (hx NSTEMI, s/p CABG x3 and mitral valve repair in 2007), continued severe mitral valve regurgitation; barrett's esophagus, depression . Admitted at [facility] 11/16-11/18 for Respiratory failure/CHF exacerbation.  o Discharged on furosemide 40 mg BID; admitted on TID. Patient concerned about fluid gain on this dose. Notes current weight 162 lbs. . Self-manages medications by filling a BID pill box. Has Medicare  Extra Help- all brand medications $8.95 and generic $3.60 for 30 or 90 day supply o COPD: Tx Trelegy daily + albuterol HFA PRN or Duonebs PRN; follows w/ Dr. Patsey Berthold; notes she was last prescribed a ProAir Respiclick, notes she prefers the MDI inhaler better o CHF: HFrEF/CAD/mitral valve; managed on Entresto 49/51 BID, spironolactone 12.5 mg BID, carvedilol 3.125 mg BID, furosemide 40 mg BID follows w/ Dr. Saralyn Pilar and HF clinic. Awaiting cardiac MRI to further delineate valvular disease o Insomnia: notes taking trazodone 50 mg QPM, alprazolam 0.25 mg QPM, and diphenhydramine 25 mg QPM for sleep.  o Hx tobacco abuse: patient has been TOBACCO FREE since 06/19/2019.  o Back pain: patient notes throbbing, bilateral lower back pain (end of spine to top of hips) since discharge; relieved by propping on pillows and not moving. Wanted to take APAP PRN, though APAP was d/c on discharge med rev, so she was concerned and want to check and see if it was safe to take firs t  Pharmacist Clinical Goal(s):  [name] Over the next 90 days, patient will work with PharmD and provider towards optimized medication management  Interventions: . Comprehensive medication review performed; medication list updated in electronic medical record . Collaborated w/ Reginia Naas, RN CM. Richarda Osmond collaborated w/ [facility]; patient amenable to cardiac MRI scheduling at [facility], whichever is sooner.  . Reviewed upcoming appt w/ HF clinic on Monday . Encouraged patient to ask the pharmacy to dispense a different version of albuterol next time, if others covered by her pharmacy (generic Ventolin HFA, Proair, Proventil vs Respiclick) . No liver disease. Likely APAP was removed to clean up her medication list, not because there was a specific reason she shouldn't take it. Messaged Dr. Derrel Nip to discuss. Also recommended heat therapy to patient.   Patient Self Care Activities:  . Patient will take medications as prescribed . Patient will  attend follow up appointments as scheduled.   Please see past updates related to this goal by clicking on the "Past Updates" button in the selected goal          Plan: - Will continue to collaborate w/ interdiscplinary team - Will f/u with patient over the next 3-4 weeks  [name] Darnelle Maffucci, PharmD, [place] Pharmacist [place] [phone]

## 2019-09-25 NOTE — Telephone Encounter (Signed)
Unable to reach patient for transitional care management. Will follow as appropriate.  

## 2019-09-25 NOTE — Patient Outreach (Signed)
[facility]) [place] Telephone Outreach PCP office completes Transition of Care follow up post-hospital discharge Post-hospital discharge day # [place] Coordination   09/25/2019  Ashley [name] May 31, 1951 [id number]  Successful telephone outreach to Ashley [name], 68 y/o femalereferred to [place] by [facility] Liaison RN CM after recent hospitalization July 21-25, 2031for acute on chronic CHF exacerbation.Patientwas discharged from hospital to home/ self-care without home health services in place. Patient has history including, but not limited to, combined CHF; CAD with previous MI/ CABG, and ICD; HTN/ HLD; GERD; COPD;history oftobacco use- patient has quit smoking since June 19, 2019.  Unfortunately, patient experiencedmultiplerecenthospital readmissions: 1)August 13-15, 2067for acute respiratory failure with hypoxia/18 days after her hospital discharge, ; patient was again discharged home without home health services in place. 2) September 9-12, 2020 for acute pulmonary edema; respiratory failure with hypoxia; and CHF exacerbation/ 25 days after previous hospital discharge; was again discharged home without home health services in place, as patient continually refuses need for home health. 3) ED visit 08/02/2019 for chest pain; patient was also diagnosed with ETOH intoxication and discharged homeafter being ruled out for ACS/ MI 4)October 6-9,2020,24 days after her last hospital discharge, again for COPD/ CHF exacerbation. 5) October 18-21, 2020, 9 days after last hospital discharge; Acute Respiratory failure with flash pulmonary edema, secondary to CHF exacerbation/ severe MS/ MR: patient had cardiac catherization during this hospital admission and was determined to have significant 3-vessel CAD and EF of 20-25% 6) November 16-18, 2020 (26 days after last hospital discharge),  again with acute respiratory failure/ CHF exacerbation.  HIPAA/ identity verified with patient; pleasant 30 minute phone call. Today, patient again reports "doing pretty goodtoday," post-most recent hospital discharge;however, she reports pain at "7/10" in her lower back that has occurred since she was discharged from hospital-- states "just resting" since hospital team discontinued her tylenol yesterday prior to hospital discharge.  Encouraged patient to promptly report new onset of pain to PCP should it worsen/ not resolve and she agrees to do so.  Patient denies new/ recent falls and sounds to be in no distress throughout phone call today  Patient further reports: --has all medications and is taking as prescribed; denies concerns around medications; able to verbalize post-hospital discharge medication instructions and confirms that she is now taking lasix 40 mg BID rather than TID as she had been doing since her October hospital discharge; patient is "nervous" about "only taking BID" as she is afraid this "will not be enough" and cause her to retain fluid and have to return to the hospital, which she clearly does not wish to occur.  Shared with patient that I had spoken to [name] Darnelle Maffucci, [facility] Pharmacist, who plans to contact her this afternoon- encouraged patient to listen for call/ share her concerns with [name], and she agrees to do so ---- confirms that she obtained flu shot during most recent hospital visit -- has continued not smoking- positive reinforcement provided -- verbalizes accurate report of upcoming provider appointments; states plans to attend all; states she will continue to drive self to all appointments -- continues able to verbalize weight gain guidelines in setting of CHF, along with signs/ symptoms yellow CHF zone and corresponding action plan; reports that she weighed herself last night after getting home from hospital and her "weoight was high- at 173 lbs," however, stated that  her weight this morning was "164 lbs." She denies issues around breathing  at time of our call and sounds to be in no distress.  Patient was encouraged to continue daily routine for self-health management of CHF, which she is very familiar and adherent to at baseline  -- again verbalizes accurate understanding that cardiac MRI at [facility] is pending post- cardiology provider office visit in October AND post- most recent hospitalization; stated that cardiology provivder at hospital told her that they are "still waiting" to hear from [place];" patient is agreeable that I place care coordination outreach to her cardiology provider and we did this today using three-way call mergin.  11:05 am:  [place] Telephone [facility] Coordination- contacted cardiology office team and spoke with [facility], who conferred with Dr. Saralyn Pilar' nurse; Gwinda Passe reports that they have been trying to contact [facility] to schedule the MRI since September 04, 2019, however, "no one at [facility] answers the phone;" Gwinda Passe provided the phone number for [facility] for patient's reference, [phone]) as the procedure '"has been ordered, but only needs to be scheduled."  We discussed options around having procedure performed at [place], and patient is agreeable to this, given the cardiology team is unable to make contact with [facility] team to schedule.  Provided [place] with my direct phone number and Gwinda Passe agrees to follow up with her efforts to schedule at [place]- with patient and myself, if necessary.  I strongly encouraged patient to accept whichever location site that can schedule the procedure most promptly, and she readily agrees to this, stating that she is tired of waiting so long and having to go to the hospital each month or more.  Patient and I both appreciate the time Gwinda Passe spent with [place] developing a plan to promptly schedule cardiac MRI  -- reviewed with patient upcoming scheduled provider office visits and she again verbalizes  plans to attend all, and hopes to e able to drive herself.  States she will get to provider appointments "one way or another," as she continues to drive self, but is worried about newly developed back pain post-hospital discharge yesterday.  Encouraged patient to make PCP aware of back pain asap and she is agreeable and states she will do.  Patient denies further issues, concerns, or problems today. I confirmed that patient hasmy direct phone number, the main [facility] CM office phone number, and the [facility] CM 24-hour nurse advice phone number should issues arise prior to next scheduled [place] outreach as previously scheduled, next week. Encouraged patient to contact me directly if needs, questions, issues, or concerns arise prior to next scheduled outreach; patient agreed to do so.  Plan:  Patient will take medications as prescribed and will attend all scheduled provider appointments  Patient will promptly notify care providers for any new concerns/ issues/ problems that arise  Patient will continue monitoring/ recording daily weightsand following action plan for CHF zones  Patient will continuenotsmoking  [place] outreach to continue with scheduled phone callnext week, or sooner of indicated  [facility] CM Care Plan Problem One     Most Recent Value  Care Plan Problem One  High risk for hospital readmission related to/ as evidenced by multiple recent hospitalizations for Acute Respiratory Failure/ CHF exacerbation with most recent hospital discharge on 09/24/2019 [problem re-stated/ goal re-established]  Role Documenting the Problem One  Care Management Coordinator  Care Plan for Problem One  Active  [facility] Long Term Goal   Over the next 31 days, patient will not experience unplanned hospital readmission, as evidenced  by patient reporting and review of EMR during [place] outreach [Goal re-established]  [facility] Long Term Goal Start Date  09/25/19 [Goal re-established today]   Interventions for Problem One Long Term Goal  Discussed current clinical condition around hopsital discharge yesterday and reviewed post-hospital discharge instructions with patient,  confirmed that patient has no current clinical concerns and is able to accurately verbalize post-hospital medication and general discharge instructions and is adhering to instructions as advised,  made patient aware that [facility] Pharmacist will be contacting her later today for detailed post-hospital discharge medication review    [facility] CM Care Plan Problem Two     Most Recent Value  Care Plan Problem Two  Need for ongoing reinforcement of self-health management strategies for chronic disease state of CHF/ COPD, as evidenced by patient reporting and recent hospitalization for CHF exacerbation  Role Documenting the Problem Two  Care Management Coordinator  Care Plan for Problem Two  Active  Interventions for Problem Two Long Term Goal   Confirmed with patient that she continues not smoking,  monitoring/ recording daily weights, and is able to verbalize signs/ symptoms yellow CHF zone along with corresponding action plan- using teachback method, reiterated with patient importance of daily home monitoring in setting of self-health management of CHF  [facility] Long Term Goal  Over the next 31 days, patient will continue to monitor and record daily weights at home and will follow action plan for yellow CHF zone, as evidenced by patient reporting and review of same during [place] RN CM outreach [Goal extended today post-most recent hospital discharge]  [facility] Long Term Goal Start Date  09/25/19 [Goal extended]  [facility] CM Short Term Goal #1   Over the next 21 days, patient will verbalize plan to obtain needed cardiac MRI, as evidenced by patient reporting/ collaboration with cardiology provider team as indicated during [facility] RN CM outreach  [facility] CM Short Term Goal #1 Start Date  09/25/19  Interventions for Short Term Goal #2   Placed care  coordination call with patient on line to patient's cardiology provider team,  discussed with team necessity to schedule cardiac MRI promptly,  discussed option of obtaining cardiac MRI at alternate locations,  encouraged patient to accept nearest date of MRI locations and encouraged cardiology team to promptly follow up with patient and with myself, as indicated to ensure this diagnostic procedure will be scheduled and completed promptly     [name], RN, BSN, [employer] Coordinator [facility] Care Management  [phone]

## 2019-09-26 ENCOUNTER — Ambulatory Visit: Payer: Self-pay | Admitting: Pharmacist

## 2019-09-26 NOTE — Chronic Care Management (AMB) (Signed)
  Chronic Care Management   Note  09/26/2019 Name: ALEEA HENDRY MRN: [id number] DOB: [birth year]  [name] is a 68 y.o. year old female who is a primary care patient of [name], [name], MD. The CCM team was consulted for assistance with chronic disease management and care coordination needs.    Contacted patient. Relayed messaged from Dr. Derrel Nip that APAP is a safe OTC pain relief option. Patient verbalized understanding and was appreciative for the call.  Follow up plan: - Will outreach patient as scheduled  [name] Darnelle Maffucci, PharmD, [place] Pharmacist [place] [phone]

## 2019-09-26 NOTE — Telephone Encounter (Signed)
Patient states she is following up with cardiology. No hospital follow up needed with PMD at this time. Encouraged to contact the office as needed.

## 2019-09-27 NOTE — Progress Notes (Signed)
Patient ID: Ashley [name], female    DOB: 07-30-1951, 68 y.o.   MRN: [id number]  HPI  Ms Coulston is a 68 y/o female with a history of CAD (CABG), hyperlipidemia, HTN, CKD, GERD, depression, COPD, PVD, current tobacco use and chronic heart failure.   Echo report from 05/27/2019 reviewed and showed an EF of 40-45% along with moderate/ severe MR. Echo report from 08/22/18 reviewed and showed an EF of 30% along with moderate MR. Echo report from 06/21/16 reviewed and showed an EF of 55-60% along with mild AR and moderate MR.   Catheterization done 08/26/2019 and showed:  There is moderate mitral valve stenosis and moderate (3+) mitral regurgitation.  Ost Cx to Prox Cx lesion is 90% stenosed.  Prox Cx lesion is 100% stenosed.  Prox LAD lesion is 90% stenosed.  Ost RCA to Prox RCA lesion is 100% stenosed.  Origin lesion is 100% stenosed.  Origin to Prox Graft lesion is 100% stenosed.  Hemodynamic findings consistent with severe pulmonary hypertension.   1.  Severe three-vessel coronary artery disease with 90% stenosis proximal LAD, 100% stenosis mid left circumflex, 100% stenosis proximal RCA 2.  Patent LIMA to LAD, occluded SVG to distal RCA and OM1 3.  Dilated cardiomyopathy with severely reduced left ventricular function, LVEF 20 to 25% 4.  Moderate to severe pulmonary hypertension 5.  Moderate mitral regurgitation 6.  Moderate mitral stenosis   Admitted 09/22/2019 due to acute on chronic HF due to flash pulmonary edema. Cardiology consult obtained. Initially given IV lasix and then transitioned to oral diuretics. Discharged after 2 days. Admitted 08/24/2019 due to acute respiratory failure due to acute on chronic heart failure. Cardiology consult obtained. Initially needed bipap and IV lasix and then was transitioned to oral diuretics. Discharged after 3 days. Admitted 08/12/2019 due to acute HF/COPD exacerbation. Cardiology consult obtained. Initially given IV lasix and then  transitioned to oral diuretics after a brief hold due to worsening renal function. Also given prednisone and nebulizer therapy. Discharged after 3 days. Was in the ED 08/02/2019 due to chest pain where she was treated and released. Admitted 07/16/2019 due to acute on chronic HF. Medication adjustments made. Discharged after 3 days.   She presents today for a follow-up visit with a chief complaint of minimal shortness of breath upon moderate exertion. She describes this as chronic in nature having been present for several years. She has associated fatigue, cough and light-headedness along with this. She denies any chest tightness, wheezing, chest pain, pedal edema, palpitations, abdominal distention, difficulty sleeping or weight gain. She says that she's had another hospitalization due to acute pulmonary edema. She says that one minute she's fine and then the next minute she's having trouble breathing.   She says that she's waiting on an appointment to get a cardiac MRI done either at [facility] or [facility].   Past Medical History:  Diagnosis Date  . AICD (automatic cardioverter/defibrillator) present    on right side  . Anemia   . CAD (coronary artery disease)    s/p CABG  . CHF (congestive heart failure) ([place])   . Chronic kidney disease    renal artery stenosis  . Colon cancer ([place])   . Depression   . GERD (gastroesophageal reflux disease)   . Hx of colonic polyps   . Hyperlipidemia   . Hypertension   . Mitral valve disorder    s/p mitral valve repair wth CABG  . Myocardial infarction ([place])   . Peripheral vascular  disease ([place])   . Presence of permanent cardiac pacemaker    Pacemaker/ Defibrillator  . Tobacco abuse 10/11/2014   Past Surgical History:  Procedure Laterality Date  . arm surgery     fracture, has plates and screws  . CABG with mitral valve repair    . CARDIAC CATHETERIZATION    . COLON SURGERY     colon cancer  . COLONOSCOPY WITH PROPOFOL N/A 10/09/2016   Procedure:  COLONOSCOPY WITH PROPOFOL;  Surgeon: Jonathon Bellows, MD;  Location: [facility] ENDOSCOPY;  Service: Endoscopy;  Laterality: N/A;  . COLONOSCOPY WITH PROPOFOL N/A 07/24/2018   Procedure: COLONOSCOPY WITH PROPOFOL;  Surgeon: Virgel Manifold, MD;  Location: [facility] ENDOSCOPY;  Service: Endoscopy;  Laterality: N/A;  . CORONARY ARTERY BYPASS GRAFT    . ENTEROSCOPY N/A 11/21/2018   Procedure: ENTEROSCOPY-BALLOON;  Surgeon: Jonathon Bellows, MD;  Location: [facility] ENDOSCOPY;  Service: Gastroenterology;  Laterality: N/A;  . ESOPHAGOGASTRODUODENOSCOPY (EGD) WITH PROPOFOL N/A 07/24/2018   Procedure: ESOPHAGOGASTRODUODENOSCOPY (EGD) WITH PROPOFOL;  Surgeon: Virgel Manifold, MD;  Location: [facility] ENDOSCOPY;  Service: Endoscopy;  Laterality: N/A;  . GIVENS CAPSULE STUDY N/A 08/16/2018   Procedure: GIVENS CAPSULE STUDY;  Surgeon: Virgel Manifold, MD;  Location: [facility] ENDOSCOPY;  Service: Endoscopy;  Laterality: N/A;  . pace maker defib  2016  . PERIPHERAL VASCULAR CATHETERIZATION N/A 10/23/2016   Procedure: Renal Angiography;  Surgeon: Algernon Huxley, MD;  Location: [place] CV LAB;  Service: Cardiovascular;  Laterality: N/A;  . RIGHT/LEFT HEART CATH AND CORONARY ANGIOGRAPHY N/A 08/26/2019   Procedure: RIGHT/LEFT HEART CATH AND CORONARY ANGIOGRAPHY;  Surgeon: Isaias Cowman, MD;  Location: [place] CV LAB;  Service: Cardiovascular;  Laterality: N/A;  . TOTAL ABDOMINAL HYSTERECTOMY  1999   history of abnormal pap   Family History  Problem Relation Age of Onset  . Arthritis Mother   . Cancer Mother        uterus cancer  . Hyperlipidemia Mother   . Hypertension Mother   . Heart disease Mother   . Diabetes Mother   . Hyperlipidemia Father   . Hypertension Father   . Heart disease Father   . Diabetes Father   . Cancer Sister        ovary cancer  . Diabetes Maternal Grandmother   . Hypertension Maternal Grandmother   . Arthritis Maternal Grandmother   . Hypertension Maternal Grandfather   .  Hypertension Paternal Grandmother   . Hypertension Paternal Grandfather   . Heart disease Paternal Grandfather   . Heart disease Brother   . Kidney disease Brother   . Breast cancer Neg Hx    Social History   Tobacco Use  . Smoking status: Former Smoker    Packs/day: 0.25    Types: Cigarettes    Quit date: 06/19/2019    Years since quitting: 0.2  . Smokeless tobacco: Never Used  . Tobacco comment: reports smoking 2-3 cigarettes/ day  Substance Use Topics  . Alcohol use: Yes    Alcohol/week: 2.0 standard drinks    Types: 2 Shots of liquor per week    Comment: occasion   Allergies  Allergen Reactions  . Vancomycin Nausea And Vomiting and Palpitations  . Hydrocodone-Acetaminophen Nausea Only   Prior to Admission medications   Medication Sig Start Date End Date Taking? Authorizing Provider  albuterol (VENTOLIN HFA) 108 (90 Base) MCG/ACT inhaler INHALE 2 PUFFS BY MOUTH INTO THE LUNGS EVERY 6 HOURS AS NEEDED FOR WHEEZING 09/04/19  Yes Tyler Pita, MD  ALPRAZolam (XANAX) 0.5 MG tablet Take 0.5-1 tablets (0.25-0.5 mg total) by mouth at bedtime. TAKE ONE TABLET BY MOUTH AT BEDTIME AS NEEDED FOR ANXIETY Patient taking differently: Take 0.25-0.5 mg by mouth at bedtime.  09/05/19  Yes [name], MD  aspirin 81 MG tablet Take 81 mg by mouth daily.   Yes [provider]  atorvastatin (LIPITOR) 40 MG tablet TAKE ONE TABLET BY MOUTH ONCE DAILY 07/25/19  Yes [name], MD  carvedilol (COREG) 6.25 MG tablet Take 1 tablet (6.25 mg total) by mouth 2 (two) times daily with a meal. 09/05/19  Yes [name], MD  clopidogrel (PLAVIX) 75 MG tablet Take 1 tablet (75 mg total) by mouth daily. 09/05/19  Yes Oseph Imburgia, Otila Kluver A, FNP  cyanocobalamin (,VITAMIN B-12,) 1000 MCG/ML injection Inject 1 mL into the muscle monthly 09/05/19  Yes [name], MD  diphenhydrAMINE (DIPHENHIST) 25 mg capsule Take 25 mg by mouth at bedtime as needed for allergies or sleep.    Yes [provider]  escitalopram (LEXAPRO) 10 MG tablet Take 1 tablet (10 mg total) by mouth daily. 09/05/19  Yes [name], MD  ferrous sulfate 325 (65 FE) MG EC tablet Take 325 mg by mouth daily with breakfast.   Yes [provider]  Fluticasone-Umeclidin-Vilant (TRELEGY ELLIPTA) 100-62.5-25 MCG/INH AEPB Inhale 1 puff into the lungs daily. 09/04/19  Yes Tyler Pita, MD  furosemide (LASIX) 40 MG tablet Take 1 tablet (40 mg total) by mouth 2 (two) times daily. 09/24/19  Yes Fritzi Mandes, MD  ipratropium-albuterol (DUONEB) 0.5-2.5 (3) MG/3ML SOLN Take 3 mLs by nebulization every 6 (six) hours as needed. 08/05/18  Yes [name], MD  nitroGLYCERIN (NITROSTAT) 0.4 MG SL tablet Place 1 tablet (0.4 mg total) under the tongue every 5 (five) minutes as needed for chest pain. 09/03/19  Yes [name], MD  omeprazole (PRILOSEC) 40 MG capsule Take 1 capsule (40 mg total) by mouth daily. 09/05/19  Yes [name], MD  potassium chloride (KLOR-CON) 10 MEQ tablet Take 1 tablet (10 mEq total) by mouth daily. 09/05/19  Yes [name], MD  sacubitril-valsartan (ENTRESTO) 24-26 MG Take 1 tablet by mouth 2 (two) times daily. 09/05/19  Yes Ryiah Bellissimo, Otila Kluver A, FNP  spironolactone (ALDACTONE) 25 MG tablet Take 0.5 tablets (12.5 mg total) by mouth daily. 09/05/19 09/04/20 Yes Devontae Casasola, Otila Kluver A, FNP  traZODone (DESYREL) 50 MG tablet Take 0.5-1 tablets (25-50 mg total) by mouth daily. One hour before bedtime 09/05/19  Yes [name], MD     Review of Systems  Constitutional: Positive for fatigue. Negative for appetite change.  HENT: Negative for congestion, postnasal drip and sore throat.   Eyes: Negative.   Respiratory: Positive for cough and shortness of breath (with moderate exertion). Negative for chest tightness and wheezing.   Cardiovascular: Negative for chest pain, palpitations and leg swelling.  Gastrointestinal: Negative for abdominal distention and abdominal pain.   Endocrine: Negative.   Genitourinary: Negative.   Musculoskeletal: Negative for back pain and neck pain.  Skin: Negative.   Allergic/Immunologic: Negative.   Neurological: Positive for light-headedness (when changing positions too quickly). Negative for dizziness.  Hematological: Negative for adenopathy. Does not bruise/bleed easily.  Psychiatric/Behavioral: Negative for dysphoric mood and sleep disturbance (sleeping on 2 pillows). The patient is not nervous/anxious.    Vitals:   09/29/19 1248  BP: 105/69  Pulse: 71  Resp: 16  SpO2: 99%  Weight: 159 lb 3.2  oz (72.2 kg)  Height: 5\' 4"  (1.626 m)   Wt Readings from Last 3 Encounters:  09/29/19 159 lb 3.2 oz (72.2 kg)  09/24/19 166 lb 3.6 oz (75.4 kg)  09/04/19 159 lb 12.8 oz (72.5 kg)   Lab Results  Component Value Date   CREATININE 1.26 (H) 09/24/2019   CREATININE 1.33 (H) 09/23/2019   CREATININE 1.44 (H) 09/22/2019     Physical Exam Vitals signs and nursing note reviewed.  Constitutional:      Appearance: She is well-developed.  HENT:     Head: Normocephalic and atraumatic.  Neck:     Musculoskeletal: Normal range of motion and neck supple.     Vascular: No JVD.  Cardiovascular:     Rate and Rhythm: Normal rate and regular rhythm.  Pulmonary:     Effort: Pulmonary effort is normal. No respiratory distress.     Breath sounds: No wheezing or rales.  Abdominal:     General: There is no distension.     Palpations: Abdomen is soft.  Musculoskeletal:     Right lower leg: She exhibits no tenderness. No edema.     Left lower leg: She exhibits no tenderness. No edema.  Skin:    General: Skin is warm and dry.  Neurological:     Mental Status: She is alert and oriented to person, place, and time.  Psychiatric:        Behavior: Behavior normal.    Assessment & Plan:  1: Chronic heart failure with reduced ejection fraction- - NYHA class II - euvolemic today - weighing daily and she was reminded to call for an  overnight weight gain of >2 pounds or a weekly weight gain of >5 pounds - weight unchanged from last visit here 1 month ago - not adding salt and has been reading food labels. Reminded to closely follow a 2000mg  sodium diet  - saw cardiology ([facility]) 09/03/2019 & says that she's supposed to have cardiac MRI [facility] or at [facility] - consider titrating up entresto at future visits if BP remains good - BNP 09/22/2019 was 1885.0 - discussed paramedicine program with patient but she is not interested at this time - patient says that she's received her flu vaccine for this season  2: HTN- - BP looks good today although on the lower side - had telemedicine visit with PCP Derrel Nip) 09/03/2019 - BMP done 111/18/2020 reviewed and showed sodium 136, potassium 4.2, creatinine 1.26 and GFR 44  3: Tobacco use- - she says that she's been tobacco free since 06/19/2019 - continued complete cessation discussed for 3 minutes with her.  Patient did not bring her medications nor a list. Each medication was verbally reviewed with the patient and she was encouraged to bring the bottles to every visit to confirm accuracy of list.  Return in 2 months or sooner for any questions/problems before then.

## 2019-09-29 ENCOUNTER — Ambulatory Visit: Payer: Medicare Other | Attending: Family | Admitting: Family

## 2019-09-29 ENCOUNTER — Encounter: Payer: Self-pay | Admitting: Family

## 2019-09-29 ENCOUNTER — Other Ambulatory Visit: Payer: Self-pay

## 2019-09-29 VITALS — BP 105/69 | HR 71 | Resp 16 | Ht 64.0 in | Wt 159.2 lb

## 2019-09-29 DIAGNOSIS — F329 Major depressive disorder, single episode, unspecified: Secondary | ICD-10-CM | POA: Insufficient documentation

## 2019-09-29 DIAGNOSIS — Z7902 Long term (current) use of antithrombotics/antiplatelets: Secondary | ICD-10-CM | POA: Insufficient documentation

## 2019-09-29 DIAGNOSIS — D649 Anemia, unspecified: Secondary | ICD-10-CM | POA: Diagnosis not present

## 2019-09-29 DIAGNOSIS — I739 Peripheral vascular disease, unspecified: Secondary | ICD-10-CM | POA: Insufficient documentation

## 2019-09-29 DIAGNOSIS — I130 Hypertensive heart and chronic kidney disease with heart failure and stage 1 through stage 4 chronic kidney disease, or unspecified chronic kidney disease: Secondary | ICD-10-CM | POA: Insufficient documentation

## 2019-09-29 DIAGNOSIS — Z8249 Family history of ischemic heart disease and other diseases of the circulatory system: Secondary | ICD-10-CM | POA: Diagnosis not present

## 2019-09-29 DIAGNOSIS — Z7982 Long term (current) use of aspirin: Secondary | ICD-10-CM | POA: Diagnosis not present

## 2019-09-29 DIAGNOSIS — I2510 Atherosclerotic heart disease of native coronary artery without angina pectoris: Secondary | ICD-10-CM | POA: Diagnosis not present

## 2019-09-29 DIAGNOSIS — K219 Gastro-esophageal reflux disease without esophagitis: Secondary | ICD-10-CM | POA: Insufficient documentation

## 2019-09-29 DIAGNOSIS — E785 Hyperlipidemia, unspecified: Secondary | ICD-10-CM | POA: Diagnosis not present

## 2019-09-29 DIAGNOSIS — I252 Old myocardial infarction: Secondary | ICD-10-CM | POA: Insufficient documentation

## 2019-09-29 DIAGNOSIS — Z885 Allergy status to narcotic agent status: Secondary | ICD-10-CM | POA: Insufficient documentation

## 2019-09-29 DIAGNOSIS — Z951 Presence of aortocoronary bypass graft: Secondary | ICD-10-CM | POA: Insufficient documentation

## 2019-09-29 DIAGNOSIS — N189 Chronic kidney disease, unspecified: Secondary | ICD-10-CM | POA: Insufficient documentation

## 2019-09-29 DIAGNOSIS — I509 Heart failure, unspecified: Secondary | ICD-10-CM | POA: Diagnosis present

## 2019-09-29 DIAGNOSIS — Z95810 Presence of automatic (implantable) cardiac defibrillator: Secondary | ICD-10-CM | POA: Diagnosis not present

## 2019-09-29 DIAGNOSIS — Z881 Allergy status to other antibiotic agents status: Secondary | ICD-10-CM | POA: Insufficient documentation

## 2019-09-29 DIAGNOSIS — Z720 Tobacco use: Secondary | ICD-10-CM

## 2019-09-29 DIAGNOSIS — I10 Essential (primary) hypertension: Secondary | ICD-10-CM

## 2019-09-29 DIAGNOSIS — Z79899 Other long term (current) drug therapy: Secondary | ICD-10-CM | POA: Insufficient documentation

## 2019-09-29 DIAGNOSIS — Z87891 Personal history of nicotine dependence: Secondary | ICD-10-CM | POA: Diagnosis not present

## 2019-09-29 DIAGNOSIS — I5022 Chronic systolic (congestive) heart failure: Secondary | ICD-10-CM | POA: Diagnosis not present

## 2019-09-29 DIAGNOSIS — Z85038 Personal history of other malignant neoplasm of large intestine: Secondary | ICD-10-CM | POA: Insufficient documentation

## 2019-09-29 DIAGNOSIS — Z8719 Personal history of other diseases of the digestive system: Secondary | ICD-10-CM | POA: Insufficient documentation

## 2019-09-29 DIAGNOSIS — Z841 Family history of disorders of kidney and ureter: Secondary | ICD-10-CM | POA: Diagnosis not present

## 2019-09-29 DIAGNOSIS — Z833 Family history of diabetes mellitus: Secondary | ICD-10-CM | POA: Diagnosis not present

## 2019-09-29 NOTE — Patient Instructions (Signed)
Continue weighing daily and call for an overnight weight gain of > 2 pounds or a weekly weight gain of >5 pounds. 

## 2019-09-30 ENCOUNTER — Other Ambulatory Visit: Payer: Self-pay | Admitting: *Deleted

## 2019-09-30 ENCOUNTER — Encounter: Payer: Self-pay | Admitting: *Deleted

## 2019-09-30 NOTE — Patient Outreach (Addendum)
[facility]) [place] Telephone Outreach PCP office completes Transition of Care follow up post-hospital discharge Post-hospital discharge day # 6  09/30/2019  Ashley [name] [birth year] [id number]  Successful telephone outreach to Ashley [name], 68 y/o femalereferred to [place] by [facility] Liaison RN CM after recent hospitalization July 21-25, 201for acute on chronic CHF exacerbation.Patientwas discharged from hospital to home/ self-care without home health services in place. Patient has history including, but not limited to, combined CHF; CAD with previous MI/ CABG, and ICD; HTN/ HLD; GERD; COPD;history oftobacco use- patient has quit smoking since June 19, 2019.  Unfortunately, patient experiencedmultiplerecenthospital readmissions: 1)August 13-15, 2017for acute respiratory failure with hypoxia/18 days after her hospital discharge, ; patient was again discharged home without home health services in place. 2) September 9-12, 2020 for acute pulmonary edema; respiratory failure with hypoxia; and CHF exacerbation/ 25 days after previous hospital discharge; was again discharged home without home health services in place, as patient continually refuses need for home health. 3) ED visit 08/02/2019 for chest pain; patient was also diagnosed with ETOH intoxication and discharged homeafter being ruled out for ACS/ MI 4)October 6-9,2020,24 days after her last hospital discharge, again for COPD/ CHF exacerbation. 5) October 18-21, 2020, 9 days after last hospital discharge; Acute Respiratory failure with flash pulmonary edema, secondary to CHF exacerbation/ severe MS/ MR: patient had cardiac catherization during this hospital admission and was determined to have significant 3-vessel CAD and EF of 20-25% 6) November 16-18, 2020 (26 days after last hospital discharge), again with acute respiratory failure/ CHF  exacerbation.  HIPAA/ identity verified with patient; pleasant phone call. Today, patient reports "doing real goodtoday," and she denies pain and new/ recent falls; reports previously reported back pain post-hospital discharge has now completely resolved; reports that her PCP told her it was okay to continue taking tylenol, which had been discontinued at time of last hospital discharge- states Tylenol "took care of" the back pain she had post- most recent hospital discharge.  Patient denies new/ recent falls and sounds to be in no distress throughout phone call today  Patient further reports: --no concerns around medications and is taking all as prescribed; confirms that she has spoken to [place] Pharmacist Ashley [name] and reviewed medications at that time; encouraged her ongoing engagement with [place] team  -- has continued not smoking- positive reinforcement provided: encouraged patient to celebrate this and she said she "does so every day;" patient has been quit for 104 days!  -- verbalizes accurate report of upcoming provider appointments; states plans to attend all;  ---- attended CHF clinic office visit yesterday; no changes made to general plan of care ---- has FINALLY been contacted just this morning by [facility] cardiac MRI department, but has not been able to definitively schedule procedure; states nurse at [facility] informed her that because she has an older pacemaker, she needs to discuss scheduling with one of the care providers and plans to do so later this afternoon; encouraged patient to promptly notify me once she has a definite scheduled date for this procedure.  I also informed patient that it appears that this procedure has been ordered at [facility] as well-- encouraged patient to accept most prompt date for scheduling and she is in complete agreement; confirmed that she has the phone number for her cardiologist and for [place]; patient agreed to contact me once she has a  definitive date scheduled for this procedure ---- currently has  fiancee providing transportation to provider appointments, as she is "nervous at time" to resume driving, given her frequent recent hospitalizations  -- reviewed daily weights at home with patient, who verifies that she continues monitoring and recording each day; patient is able to independently verbalize weight gain guidelines/ signs/ symptoms yellow CHF zone without prompting ---- reports daily weights at home consistently "159 lbs" over last week- which is at goal ----  Patient was encouraged to continue daily routine for self-health management of CHF, which she is very familiar and adherent to at baseline  Patient denies further issues, concerns, or problems today. I confirmed that patient hasmy direct phone number, the main [facility] CM office phone number, and the [facility] CM 24-hour nurse advice phone number should issues arise prior to next scheduled [place] outreach in 2 weeks, if not sooner around scheduling of needed cardiac MRI. Encouraged patient to contact me directly if needs, questions, issues, or concerns arise prior to next scheduled outreach; patient agreed to do so.  3:20 pm:  Received voice mail from patient, confirming that she heard back from [facility] and has a scheduled date of November 03, 2019 to obtain cardiac MRI; we had discussed possibility of patient obtaining diagnostic procedure at [facility]- and at that time, I encouraged patient to take whichever site could schedule sooner and she verbalized understanding of same; will plan outreach to patient as scheduled/ discussed with her earlier today  Plan:  Patient will take medications as prescribed and will attend all scheduled provider appointments  Patient will promptly notify care providers for any new concerns/ issues/ problems that arise  Patient will continue monitoring/ recording daily weightsand following action plan for CHF zones  Patient will  continuenotsmoking  [facility] CM outreach to continue with scheduled phone callin 2 weeks, sooner of indicated  [facility] CM Care Plan Problem One     Most Recent Value  Care Plan Problem One  High risk for hospital readmission related to/ as evidenced by multiple recent hospitalizations for Acute Respiratory Failure/ CHF exacerbation with most recent hospital discharge on 09/24/2019  Role Documenting the Problem One  Care Management Coordinator  Care Plan for Problem One  Active  [facility] Long Term Goal   Over the next 31 days, patient will not experience unplanned hospital readmission, as evidenced by patient reporting and review of EMR during [facility] RN CCM outreach  [facility] Long Term Goal Start Date  09/25/19  Interventions for Problem One Long Term Goal  Discussed current clinical conditon with patient and confirmed that she has no current clinical concerns,  confirmed that patient continues following action plan for CHF zones and performing daily weights at home as instructed,  reviewed weights at home with patient and confirmed that she remains able to independently verbalize weight gain guidelines with corresponding action plan in setting of CHF    [facility] CM Care Plan Problem Two     Most Recent Value  Care Plan Problem Two  Need for ongoing reinforcement of self-health management strategies for chronic disease state of CHF/ COPD, as evidenced by patient reporting and recent hospitalization for CHF exacerbation  Role Documenting the Problem Two  Care Management Coordinator  Care Plan for Problem Two  Active  Interventions for Problem Two Long Term Goal   Confirmed that patient continues monitoring and recording daily weights at home,  reviewed recent weights at home and confirmed that she remains able to independently verbalize weight gain guidelines/ corresponding action plan for yellow CHF  zone   [facility] Long Term Goal  Over the next 31 days, patient will continue to monitor and record daily weights at  home and will follow action plan for yellow CHF zone, as evidenced by patient reporting and review of same during [place] outreach  [place] Term Goal Start Date  09/25/19  [facility] CM Short Term Goal #1   Over the next 21 days, patient will verbalize plan to obtain needed cardiac MRI, as evidenced by patient reporting/ collaboration with cardiology provider team as indicated during [facility] RN CM outreach  [facility] CM Short Term Goal #1 Start Date  09/25/19  Interventions for Short Term Goal #2   Confirmed that patient has been contacted by [facility] around scheduling previously ordered cardiac MRI,  confirmed that MRI has not yet been definitively scheduled and confirmed that she has the phone number for [facility] cardiac MRI department,  confirmed for patient that from review of EMR, it appears that order for cardiac MRI has also been placed with [place]- encouraged patient to accept whichever site is able to schedule most prompt procedure     [name], RN, BSN, [place] Coordinator [facility] Care Management  [phone]

## 2019-10-02 LAB — LYME DISEASE, WESTERN BLOT
IgG P18 Ab.: ABSENT
IgG P23 Ab.: ABSENT
IgG P28 Ab.: ABSENT
IgG P30 Ab.: ABSENT
IgG P39 Ab.: ABSENT
IgG P41 Ab.: ABSENT
IgG P45 Ab.: ABSENT
IgG P58 Ab.: ABSENT
IgG P66 Ab.: ABSENT
IgG P93 Ab.: ABSENT
IgM P23 Ab.: ABSENT
IgM P39 Ab.: ABSENT
IgM P41 Ab.: ABSENT
Lyme IgG Wb: NEGATIVE
Lyme IgM Wb: NEGATIVE

## 2019-10-06 ENCOUNTER — Ambulatory Visit (INDEPENDENT_AMBULATORY_CARE_PROVIDER_SITE_OTHER): Payer: Medicare Other | Admitting: Pharmacist

## 2019-10-06 DIAGNOSIS — I5043 Acute on chronic combined systolic (congestive) and diastolic (congestive) heart failure: Secondary | ICD-10-CM

## 2019-10-06 NOTE — Chronic Care Management (AMB) (Signed)
Chronic Care Management   Follow Up Note   10/06/2019 Name: Ashley [name] MRN: [id number] DOB: [birth year]  Referred by: [name], MD Reason for referral : Chronic Care Management (Medication Management )   Ashley [name] is a 68 y.o. year old female who is a primary care patient of [name], [name], MD. The CCM team was consulted for assistance with chronic disease management and care coordination needs.    Contacted patient for medication management review today. Care coordination also completed.   Review of patient status, including review of consultants reports, relevant laboratory and other test results, and collaboration with appropriate care team members and the patient's provider was performed as part of comprehensive patient evaluation and provision of chronic care management services.    SDOH (Social Determinants of Health) screening performed today: Financial Strain  Tobacco Use. See Care Plan for related entries.   Outpatient Encounter Medications as of 10/06/2019  Medication Sig Note   albuterol (VENTOLIN HFA) 108 (90 Base) MCG/ACT inhaler INHALE 2 PUFFS BY MOUTH INTO THE LUNGS EVERY 6 HOURS AS NEEDED FOR WHEEZING    aspirin 81 MG tablet Take 81 mg by mouth daily.    atorvastatin (LIPITOR) 40 MG tablet TAKE ONE TABLET BY MOUTH ONCE DAILY    carvedilol (COREG) 6.25 MG tablet Take 1 tablet (6.25 mg total) by mouth 2 (two) times daily with a meal.    clopidogrel (PLAVIX) 75 MG tablet Take 1 tablet (75 mg total) by mouth daily.    cyanocobalamin (,VITAMIN B-12,) 1000 MCG/ML injection Inject 1 mL into the muscle monthly    escitalopram (LEXAPRO) 10 MG tablet Take 1 tablet (10 mg total) by mouth daily.    Fluticasone-Umeclidin-Vilant (TRELEGY ELLIPTA) 100-62.5-25 MCG/INH AEPB Inhale 1 puff into the lungs daily.    furosemide (LASIX) 40 MG tablet Take 1 tablet (40 mg total) by mouth 2 (two) times daily.    ipratropium-albuterol (DUONEB) 0.5-2.5 (3) MG/3ML  SOLN Take 3 mLs by nebulization every 6 (six) hours as needed. 09/25/2019: QPM   omeprazole (PRILOSEC) 40 MG capsule Take 1 capsule (40 mg total) by mouth daily.    potassium chloride (KLOR-CON) 10 MEQ tablet Take 1 tablet (10 mEq total) by mouth daily.    sacubitril-valsartan (ENTRESTO) 24-26 MG Take 1 tablet by mouth 2 (two) times daily.    spironolactone (ALDACTONE) 25 MG tablet Take 0.5 tablets (12.5 mg total) by mouth daily.    ALPRAZolam (XANAX) 0.5 MG tablet Take 0.5-1 tablets (0.25-0.5 mg total) by mouth at bedtime. TAKE ONE TABLET BY MOUTH AT BEDTIME AS NEEDED FOR ANXIETY (Patient taking differently: Take 0.25-0.5 mg by mouth at bedtime. ) 09/25/2019: 1 QPM   diphenhydrAMINE (DIPHENHIST) 25 mg capsule Take 25 mg by mouth at bedtime as needed for allergies or sleep.     ferrous sulfate 325 (65 FE) MG EC tablet Take 325 mg by mouth daily with breakfast.    nitroGLYCERIN (NITROSTAT) 0.4 MG SL tablet Place 1 tablet (0.4 mg total) under the tongue every 5 (five) minutes as needed for chest pain.    traZODone (DESYREL) 50 MG tablet Take 0.5-1 tablets (25-50 mg total) by mouth daily. One hour before bedtime 09/25/2019: 1 tab QPM   No facility-administered encounter medications on file as of 10/06/2019.      Goals Addressed            This Visit's Progress     Patient Stated    "I want to stay out of the  hospital" (pt-stated)       Current Barriers:   Polypharmacy; complex patient with multiple comorbidities including HFrEF, COPD, CAD (hx NSTEMI, s/p CABG x3 and mitral valve repair in 2007), continued severe mitral valve regurgitation; barrett's esophagus, depression  Most recent admission 11/16-11/18 for Respiratory failure/CHF exacerbation. o [facility] HF Clinic appointment 11/23 - no medication changes made. Patient noted some lightheadedness/dizziness, especially upon standing. o Cardiac MRI scheduled at [facility] 12/28; patient waiting to hear back from [facility] scheduling to see if  can be done any sooner at [facility]   Self-manages medications by filling a BID pill box. Has Medicare Extra Help- all brand medications $8.95 and generic $3.60 for 30 or 90 day supply o COPD: Tx Trelegy daily + albuterol HFA PRN or Duonebs PRN; follows w/ Dr. Patsey Berthold; notes she has only been needing nebulizer "on and off" since last hospitalzation o CHF: HFrEF/CAD/mitral valve; managed on Entresto 24/26 BID, spironolactone 12.5 mg BID, carvedilol 6.25 mg BID, furosemide 40 mg BID follows w/ Dr. Saralyn Pilar and HF clinic. Weights remain 159-160 lbs QAM o Insomnia: notes taking trazodone 50 mg QPM, alprazolam 0.25 mg QPM, and diphenhydramine 25 mg QPM for sleep.  o Hx tobacco abuse: patient has been TOBACCO FREE since 06/19/2019.  o Back pain: patient notes this has completely resolved  Pharmacist Clinical Goal(s):   Over the next 90 days, patient will work with PharmD and provider towards optimized medication management  Interventions:  Encouraged continued medication adherence and daily weights. Reviewed refill history, appears patient is up to date on all refills and is filling 90 day supplies  Per Epic, patient has scheduled appt w/ Dr. Sharlet Salina office on 12/4- patient was unaware of this. She will call them today to follow up about this.   Care coordination w/ Reginia Naas, RN CM, completed. Will continue to collaborate w/ interdisciplinary team in the care of this patient.   Patient Self Care Activities:   Patient will take medications as prescribed  Patient will attend follow up appointments as scheduled.   Please see past updates related to this goal by clicking on the "Past Updates" button in the selected goal          Plan: - Scheduled outreach in ~ 5 weeks for continued medication management support  [name] Darnelle Maffucci, PharmD, [place], [place] Pharmacist [facility] [phone]

## 2019-10-06 NOTE — Patient Instructions (Signed)
Visit Information  Goals Addressed            This Visit's Progress     Patient Stated   . "I want to stay out of the hospital" (pt-stated)       Current Barriers:  . Polypharmacy; complex patient with multiple comorbidities including HFrEF, COPD, CAD (hx NSTEMI, s/p CABG x3 and mitral valve repair in 2007), continued severe mitral valve regurgitation; barrett's esophagus, depression . Most recent admission 11/16-11/18 for Respiratory failure/CHF exacerbation. o [facility] HF Clinic appointment 11/23 - no medication changes made. Patient noted some lightheadedness/dizziness, especially upon standing. o Cardiac MRI scheduled at [facility] 12/28; patient waiting to hear back from [facility] scheduling to see if can be done any sooner at [facility]  . Self-manages medications by filling a BID pill box. Has Medicare Extra Help- all brand medications $8.95 and generic $3.60 for 30 or 90 day supply o COPD: Tx Trelegy daily + albuterol HFA PRN or Duonebs PRN; follows w/ Dr. Patsey Berthold; notes she has only been needing nebulizer "on and off" since last hospitalzation o CHF: HFrEF/CAD/mitral valve; managed on Entresto 24/26 BID, spironolactone 12.5 mg BID, carvedilol 6.25 mg BID, furosemide 40 mg BID follows w/ Dr. Saralyn Pilar and HF clinic. Weights remain 159-160 lbs QAM o Insomnia: notes taking trazodone 50 mg QPM, alprazolam 0.25 mg QPM, and diphenhydramine 25 mg QPM for sleep.  o Hx tobacco abuse: patient has been TOBACCO FREE since 06/19/2019.  o Back pain: patient notes this has completely resolved  Pharmacist Clinical Goal(s):  [name] Over the next 90 days, patient will work with PharmD and provider towards optimized medication management  Interventions: . Encouraged continued medication adherence and daily weights. Reviewed refill history, appears patient is up to date on all refills and is filling 90 day supplies . Per Epic, patient has scheduled appt w/ Dr. Sharlet Salina office on 12/4- patient was unaware of this. She will  call them today to follow up about this.  . Care coordination w/ Reginia Naas, RN CM, completed. Will continue to collaborate w/ interdisciplinary team in the care of this patient.   Patient Self Care Activities:  . Patient will take medications as prescribed . Patient will attend follow up appointments as scheduled.   Please see past updates related to this goal by clicking on the "Past Updates" button in the selected goal         The patient verbalized understanding of instructions provided today and declined a print copy of patient instruction materials.   Plan: - Scheduled outreach in ~ 5 weeks for continued medication management support  [name] Darnelle Maffucci, PharmD, [place], [place] Pharmacist [place] [phone]

## 2019-10-10 ENCOUNTER — Other Ambulatory Visit: Payer: Self-pay | Admitting: *Deleted

## 2019-10-10 NOTE — Patient Outreach (Signed)
[facility]) Care Management  10/10/2019  Ashley [name] Oct 28, 1951 [id number]   [facility] CM Multidisciplinary case conference re: Update in blue text  [name], 68 y/o [place] [place] [facility] RN CM after recent hospitalization July 21-25, 2087for acute on chronic CHF exacerbation.Patientwas discharged from hospital to home/ self-care without home health services in place. Patient has history including, but not limited to, combined CHF; CAD with previous MI/ CABG, and ICD; HTN/ HLD; GERD; COPD;history oftobacco use- patient has quit smoking since June 19, 2019.  Unfortunately, patient experiencedmultiplerecenthospital readmissions: 1)August 13-15, 2067for acute respiratory failure with hypoxia/18 days after her hospital discharge, ; patient was again discharged home without home health services in place. 2) September 9-12, 2020 for acute pulmonary edema; respiratory failure with hypoxia; and CHF exacerbation/ 25 days after previous hospital discharge; was again discharged home without home health services in place, as patient continually refuses need for home health. 3) ED visit 08/02/2019 for chest pain; patient was also diagnosed with ETOH intoxication and discharged homeafter being ruled out for ACS/ MI 4)October 6-9,2020,24 days after her last hospital discharge, again for COPD/ CHF exacerbation. 5) October 18-21, 2020, 9 days after last hospital discharge; Acute Respiratory failure with flash pulmonary edema, secondary to CHF exacerbation/ severe MS/ MR: patient had cardiac catherization during this hospital admission and was determined to have significant 3-vessel CAD and EF of 20-25% 6) November 16-18, 2020 (26 days afterlast hospital discharge), again with acute respiratory failure/ CHF exacerbation.  Patient frustration that no definitive intervention is completed while she is inpatient/ hospitalized;  significant delay in scheduling recommended cardiac MRI at [facility] has facilitated getting cardiac MRI scheduled by coordinating with cardiology provider team, however, it is not scheduled until the end of December; looking into option of having test completed at [facility].  Patient fearful that she will re-admit prior to having the MRI done on 11/03/2019  [facility] CM Team Discussion/ plan:  10/10/2019: -- Will continue care coordination efforts to explore option of having cardiac MRI scheduled at [facility] rather than [facility] -- Will submit case for MD peer-peer intervention/ follow up  From 10/16 conference: -- patient to attend cardiology office visit this afternoon, 08/22/2019 for follow up and discussion/ development of plan for possible need for MVR revision/ replacement -- patient maintains that she has continued NOT smoking and only consumes alcohol socially; continues to endorse that her family members that do smoke are no longer smoking inside the home -- patient frustrated around cardiology notes from most recent hospitalization documenting that she is still smoking- patient adamant that she has been quit since June 19, 2019  -- patient frustrated that when she is at hospital there has not been discussion around transferring her to [facility] as inpatient (as per discussion patient had with cardiology during last hospital visit) and fixing the MV, rather than discharging her home to follow up as outpatient; after her in-patient ECHO done in July patient felt that the potential issue around the need for MVR/ revision was not discussed with her as a priority need.  Plan:  [facility] CM team to continue to follow patient closely and follow up on cardiology office visit today and PCP office visit on Monday 08/25/2019  From 07/04/2019 case conference:  -- continue efforts to encourage patient to remain smoke free: collaborate with providers/ new pulmonary referral (pending): patient attended pulmonary referral  provider office visit on 07/24/2019; pulmonary provider agreed that patient's recent issues  with respiratory status may likely be related more to need for revision of MVR; PFT's pending- appointment -- engage patient's fiancee/ family in efforts to partner with patient and also quit smoking-- follow up patient/ family on efforts -- explore nutritional habits/ practices more thoroughly with ongoing outreaches -- engage patient's PCP in efforts to discern reason for multiple recent hospitalizations -- encourage/ facilitate as indicated patient to promptly schedule hospital discharge cardiology appointment for evaluation of need for MVR revision --continue to follow closely;resubmit to [place] case discussionin 3 weeks for update, unless indicated sooner  Barriers:  -- lack of trust in health care team related to no definitive intervention taken to address need for cardiac surgery while she is hospitalized; frustrated that she is discharged home with each of her multiple admissions, despite being told at hospital she needs cardiac surgery.  From 07/04/2019 and 08/01/2019 case conference:  -- Extensive clinical history: cardiac and respiratory; overall fragile with occasional- intermittent episodes of extreme overnight weight gain > 3 lbs -- Ongoing smoking-- patient reports has recently quit over last 2 weeks after last hospital discharge:patient endorses that she quit smoking on June 19, 2019 at time of her hospital admission -- Need for [place] involved/ addressing: medications now optimized; [name] continues following for ongoing management of patient medication needs -- questionable issues around patent's perception of health status; may not be based in reality; patient not proactive in addressing acute issues; has not followed up on requests for follow up needed by her to address her medication concerns; ongoing uncertainty around patient's  perception/ motivation of self- management/ control her clinical condition -- good family support overall; family members have now taken their own smoking outside to help patient remain smoke free -- patient reported stress around her fiancee's health/ recent hospitalizations, although she is noted to be on multiple medications for stress/ anxiety control -- updated ECHO performed during most recent hospital admission, with EF noted 45% (decreased from 55-60% in 2017)- inpatient medical team suspects possibility that 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  [name], RN, BSN, [employer] Coordinator [facility] Care Management  [phone]

## 2019-10-15 ENCOUNTER — Inpatient Hospital Stay
Admission: EM | Admit: 2019-10-15 | Discharge: 2019-10-16 | DRG: 292 | Disposition: A | Discharge status: Home / Self Care | Payer: Medicare Other | Attending: Internal Medicine | Admitting: Internal Medicine

## 2019-10-15 ENCOUNTER — Telehealth: Payer: Self-pay | Admitting: Pulmonary Disease

## 2019-10-15 ENCOUNTER — Emergency Department: Payer: Medicare Other

## 2019-10-15 ENCOUNTER — Encounter: Payer: Self-pay | Admitting: *Deleted

## 2019-10-15 ENCOUNTER — Other Ambulatory Visit: Payer: Self-pay | Admitting: *Deleted

## 2019-10-15 ENCOUNTER — Encounter: Payer: Self-pay | Admitting: Emergency Medicine

## 2019-10-15 DIAGNOSIS — Z90710 Acquired absence of both cervix and uterus: Secondary | ICD-10-CM | POA: Diagnosis not present

## 2019-10-15 DIAGNOSIS — Z85038 Personal history of other malignant neoplasm of large intestine: Secondary | ICD-10-CM | POA: Diagnosis not present

## 2019-10-15 DIAGNOSIS — K219 Gastro-esophageal reflux disease without esophagitis: Secondary | ICD-10-CM | POA: Diagnosis present

## 2019-10-15 DIAGNOSIS — Z95810 Presence of automatic (implantable) cardiac defibrillator: Secondary | ICD-10-CM | POA: Diagnosis not present

## 2019-10-15 DIAGNOSIS — I701 Atherosclerosis of renal artery: Secondary | ICD-10-CM | POA: Diagnosis present

## 2019-10-15 DIAGNOSIS — Z87891 Personal history of nicotine dependence: Secondary | ICD-10-CM

## 2019-10-15 DIAGNOSIS — I052 Rheumatic mitral stenosis with insufficiency: Secondary | ICD-10-CM | POA: Diagnosis present

## 2019-10-15 DIAGNOSIS — Z79899 Other long term (current) drug therapy: Secondary | ICD-10-CM

## 2019-10-15 DIAGNOSIS — Z8249 Family history of ischemic heart disease and other diseases of the circulatory system: Secondary | ICD-10-CM

## 2019-10-15 DIAGNOSIS — Z515 Encounter for palliative care: Secondary | ICD-10-CM | POA: Diagnosis not present

## 2019-10-15 DIAGNOSIS — Z86010 Personal history of colonic polyps: Secondary | ICD-10-CM

## 2019-10-15 DIAGNOSIS — E785 Hyperlipidemia, unspecified: Secondary | ICD-10-CM | POA: Diagnosis present

## 2019-10-15 DIAGNOSIS — Z7902 Long term (current) use of antithrombotics/antiplatelets: Secondary | ICD-10-CM

## 2019-10-15 DIAGNOSIS — Z20828 Contact with and (suspected) exposure to other viral communicable diseases: Secondary | ICD-10-CM | POA: Diagnosis present

## 2019-10-15 DIAGNOSIS — I255 Ischemic cardiomyopathy: Secondary | ICD-10-CM | POA: Diagnosis present

## 2019-10-15 DIAGNOSIS — I272 Pulmonary hypertension, unspecified: Secondary | ICD-10-CM | POA: Diagnosis present

## 2019-10-15 DIAGNOSIS — I252 Old myocardial infarction: Secondary | ICD-10-CM

## 2019-10-15 DIAGNOSIS — Z881 Allergy status to other antibiotic agents status: Secondary | ICD-10-CM

## 2019-10-15 DIAGNOSIS — Z7189 Other specified counseling: Secondary | ICD-10-CM | POA: Diagnosis not present

## 2019-10-15 DIAGNOSIS — I110 Hypertensive heart disease with heart failure: Secondary | ICD-10-CM | POA: Diagnosis present

## 2019-10-15 DIAGNOSIS — Z885 Allergy status to narcotic agent status: Secondary | ICD-10-CM

## 2019-10-15 DIAGNOSIS — Z8349 Family history of other endocrine, nutritional and metabolic diseases: Secondary | ICD-10-CM

## 2019-10-15 DIAGNOSIS — R079 Chest pain, unspecified: Secondary | ICD-10-CM | POA: Diagnosis not present

## 2019-10-15 DIAGNOSIS — I25810 Atherosclerosis of coronary artery bypass graft(s) without angina pectoris: Secondary | ICD-10-CM | POA: Diagnosis present

## 2019-10-15 DIAGNOSIS — Z7982 Long term (current) use of aspirin: Secondary | ICD-10-CM

## 2019-10-15 DIAGNOSIS — F329 Major depressive disorder, single episode, unspecified: Secondary | ICD-10-CM | POA: Diagnosis present

## 2019-10-15 DIAGNOSIS — I5023 Acute on chronic systolic (congestive) heart failure: Secondary | ICD-10-CM

## 2019-10-15 DIAGNOSIS — I161 Hypertensive emergency: Secondary | ICD-10-CM | POA: Diagnosis present

## 2019-10-15 DIAGNOSIS — I2510 Atherosclerotic heart disease of native coronary artery without angina pectoris: Secondary | ICD-10-CM | POA: Diagnosis present

## 2019-10-15 DIAGNOSIS — R0789 Other chest pain: Secondary | ICD-10-CM

## 2019-10-15 DIAGNOSIS — J449 Chronic obstructive pulmonary disease, unspecified: Secondary | ICD-10-CM | POA: Diagnosis present

## 2019-10-15 DIAGNOSIS — I739 Peripheral vascular disease, unspecified: Secondary | ICD-10-CM | POA: Diagnosis present

## 2019-10-15 DIAGNOSIS — Z7951 Long term (current) use of inhaled steroids: Secondary | ICD-10-CM

## 2019-10-15 LAB — TROPONIN I (HIGH SENSITIVITY): Troponin I (High Sensitivity): 36 ng/L — ABNORMAL HIGH (ref <18)

## 2019-10-15 LAB — CBC WITH DIFFERENTIAL/PLATELET
Abs Immature Granulocytes: 0.06 10*3/uL (ref 0.00–0.07)
Basophils Absolute: 0.1 10*3/uL (ref 0.0–0.1)
Basophils Relative: 1 %
Eosinophils Absolute: 0.1 10*3/uL (ref 0.0–0.5)
Eosinophils Relative: 1 %
HCT: 41.9 % (ref 36.0–46.0)
Hemoglobin: 14.9 g/dL (ref 12.0–15.0)
Immature Granulocytes: 0 %
Lymphocytes Relative: 11 %
Lymphs Abs: 1.5 10*3/uL (ref 0.7–4.0)
MCH: 31.7 pg (ref 26.0–34.0)
MCHC: 35.6 g/dL (ref 30.0–36.0)
MCV: 89.1 fL (ref 80.0–100.0)
Monocytes Absolute: 1 10*3/uL (ref 0.1–1.0)
Monocytes Relative: 8 %
Neutro Abs: 10.6 10*3/uL — ABNORMAL HIGH (ref 1.7–7.7)
Neutrophils Relative %: 79 %
Platelets: 254 10*3/uL (ref 150–400)
RBC: 4.7 MIL/uL (ref 3.87–5.11)
RDW: 14.9 % (ref 11.5–15.5)
WBC: 13.5 10*3/uL — ABNORMAL HIGH (ref 4.0–10.5)
nRBC: 0 % (ref 0.0–0.2)

## 2019-10-15 LAB — COMPREHENSIVE METABOLIC PANEL
ALT: 16 U/L (ref 0–44)
AST: 26 U/L (ref 15–41)
Albumin: 4.5 g/dL (ref 3.5–5.0)
Alkaline Phosphatase: 80 U/L (ref 38–126)
Anion gap: 17 — ABNORMAL HIGH (ref 5–15)
BUN: 16 mg/dL (ref 8–23)
CO2: 21 mmol/L — ABNORMAL LOW (ref 22–32)
Calcium: 9 mg/dL (ref 8.9–10.3)
Chloride: 98 mmol/L (ref 98–111)
Creatinine, Ser: 1.18 mg/dL — ABNORMAL HIGH (ref 0.44–1.00)
GFR calc Af Amer: 56 mL/min — ABNORMAL LOW (ref >60)
GFR calc non Af Amer: 48 mL/min — ABNORMAL LOW (ref >60)
Glucose, Bld: 178 mg/dL — ABNORMAL HIGH (ref 70–99)
Potassium: 3.9 mmol/L (ref 3.5–5.1)
Sodium: 136 mmol/L (ref 135–145)
Total Bilirubin: 1.5 mg/dL — ABNORMAL HIGH (ref 0.3–1.2)
Total Protein: 7.5 g/dL (ref 6.5–8.1)

## 2019-10-15 LAB — BRAIN NATRIURETIC PEPTIDE: B Natriuretic Peptide: >4500 pg/mL — ABNORMAL HIGH (ref 0.0–100.0)

## 2019-10-15 MED ORDER — ONDANSETRON HCL 4 MG/2ML IJ SOLN: 4.0000 mg | Freq: Four times a day (QID) | INTRAMUSCULAR | Status: DC | PRN

## 2019-10-15 MED ORDER — FUROSEMIDE 10 MG/ML IJ SOLN
40.0000 mg | Freq: Once | INTRAMUSCULAR | Status: AC
  Administered 2019-10-15: 15:00:00 40 mg via INTRAVENOUS
  Filled 2019-10-15: qty 4

## 2019-10-15 MED ORDER — FLUTICASONE-UMECLIDIN-VILANT 100-62.5-25 MCG/INH IN AEPB: 1.0000 | INHALATION_SPRAY | Freq: Every day | RESPIRATORY_TRACT | Status: DC

## 2019-10-15 MED ORDER — ALPRAZOLAM 0.5 MG PO TABS
0.2500 mg | ORAL_TABLET | Freq: Every day | ORAL | Status: DC
  Administered 2019-10-15: 22:00:00 0.5 mg via ORAL
  Filled 2019-10-15: qty 1

## 2019-10-15 MED ORDER — FUROSEMIDE 10 MG/ML IJ SOLN
40.0000 mg | Freq: Two times a day (BID) | INTRAMUSCULAR | Status: DC
  Administered 2019-10-16: 40 mg via INTRAVENOUS
  Filled 2019-10-15 (×2): qty 4

## 2019-10-15 MED ORDER — LABETALOL HCL 5 MG/ML IV SOLN
10.0000 mg | INTRAVENOUS | Status: DC | PRN
  Filled 2019-10-15: qty 4

## 2019-10-15 MED ORDER — ASPIRIN EC 81 MG PO TBEC
81.0000 mg | DELAYED_RELEASE_TABLET | Freq: Every day | ORAL | Status: DC
  Administered 2019-10-16: 10:00:00 81 mg via ORAL
  Filled 2019-10-15 (×2): qty 1

## 2019-10-15 MED ORDER — ATORVASTATIN CALCIUM 20 MG PO TABS
40.0000 mg | ORAL_TABLET | Freq: Every day | ORAL | Status: DC
  Administered 2019-10-16: 10:00:00 40 mg via ORAL
  Filled 2019-10-15 (×2): qty 2

## 2019-10-15 MED ORDER — DIPHENHYDRAMINE HCL 25 MG PO CAPS: 25.0000 mg | ORAL_CAPSULE | Freq: Every evening | ORAL | Status: DC | PRN

## 2019-10-15 MED ORDER — CLOPIDOGREL BISULFATE 75 MG PO TABS
75.0000 mg | ORAL_TABLET | Freq: Every day | ORAL | Status: DC
  Administered 2019-10-16: 10:00:00 75 mg via ORAL
  Filled 2019-10-15: qty 1

## 2019-10-15 MED ORDER — NITROGLYCERIN 0.4 MG SL SUBL
0.4000 mg | SUBLINGUAL_TABLET | SUBLINGUAL | Status: DC | PRN
  Administered 2019-10-15: 0.4 mg via SUBLINGUAL
  Filled 2019-10-15: qty 1

## 2019-10-15 MED ORDER — IPRATROPIUM-ALBUTEROL 0.5-2.5 (3) MG/3ML IN SOLN
3.0000 mL | Freq: Once | RESPIRATORY_TRACT | Status: AC
  Administered 2019-10-15: 16:00:00 3 mL via RESPIRATORY_TRACT
  Filled 2019-10-15: qty 3

## 2019-10-15 MED ORDER — TRAZODONE HCL 50 MG PO TABS
25.0000 mg | ORAL_TABLET | Freq: Every day | ORAL | Status: DC
  Administered 2019-10-15: 22:00:00 50 mg via ORAL
  Filled 2019-10-15 (×2): qty 1

## 2019-10-15 MED ORDER — PANTOPRAZOLE SODIUM 40 MG PO TBEC
40.0000 mg | DELAYED_RELEASE_TABLET | Freq: Every day | ORAL | Status: DC
  Administered 2019-10-16: 40 mg via ORAL
  Filled 2019-10-15: qty 1

## 2019-10-15 MED ORDER — ESCITALOPRAM OXALATE 10 MG PO TABS
10.0000 mg | ORAL_TABLET | Freq: Every day | ORAL | Status: DC
Start: 2019-10-16 — End: 2019-10-16
  Administered 2019-10-16: 10:00:00 10 mg via ORAL
  Filled 2019-10-15: qty 1

## 2019-10-15 MED ORDER — SPIRONOLACTONE 25 MG PO TABS
12.5000 mg | ORAL_TABLET | Freq: Every day | ORAL | Status: DC
  Administered 2019-10-16: 12.5 mg via ORAL
  Filled 2019-10-15: qty 0.5

## 2019-10-15 MED ORDER — IPRATROPIUM-ALBUTEROL 0.5-2.5 (3) MG/3ML IN SOLN: 3.0000 mL | Freq: Four times a day (QID) | RESPIRATORY_TRACT | Status: DC | PRN

## 2019-10-15 MED ORDER — CARVEDILOL 6.25 MG PO TABS
6.2500 mg | ORAL_TABLET | Freq: Two times a day (BID) | ORAL | Status: DC
  Administered 2019-10-15 – 2019-10-16 (×2): 6.25 mg via ORAL
  Filled 2019-10-15 (×3): qty 1

## 2019-10-15 MED ORDER — ASPIRIN 81 MG PO CHEW
324.0000 mg | CHEWABLE_TABLET | Freq: Once | ORAL | Status: AC
  Administered 2019-10-15: 15:00:00 324 mg via ORAL
  Filled 2019-10-15: qty 4

## 2019-10-15 MED ORDER — SODIUM CHLORIDE 0.9% FLUSH
3.0000 mL | Freq: Two times a day (BID) | INTRAVENOUS | Status: DC
  Administered 2019-10-15 – 2019-10-16 (×2): 3 mL via INTRAVENOUS

## 2019-10-15 MED ORDER — POTASSIUM CHLORIDE CRYS ER 10 MEQ PO TBCR
10.0000 meq | EXTENDED_RELEASE_TABLET | Freq: Every day | ORAL | Status: DC
  Administered 2019-10-16: 10:00:00 10 meq via ORAL
  Filled 2019-10-15 (×2): qty 1

## 2019-10-15 MED ORDER — SODIUM CHLORIDE 0.9 % IV SOLN: 250.0000 mL | INTRAVENOUS | Status: DC | PRN

## 2019-10-15 MED ORDER — SODIUM CHLORIDE 0.9% FLUSH: 3.0000 mL | INTRAVENOUS | Status: DC | PRN

## 2019-10-15 MED ORDER — SACUBITRIL-VALSARTAN 24-26 MG PO TABS
1.0000 | ORAL_TABLET | Freq: Two times a day (BID) | ORAL | Status: DC
  Administered 2019-10-15: 22:00:00 1 via ORAL
  Filled 2019-10-15 (×4): qty 1

## 2019-10-15 MED ORDER — ACETAMINOPHEN 325 MG PO TABS: 650.0000 mg | ORAL_TABLET | ORAL | Status: DC | PRN

## 2019-10-15 MED ORDER — FERROUS SULFATE 325 (65 FE) MG PO TABS
325.0000 mg | ORAL_TABLET | Freq: Every day | ORAL | Status: DC
  Administered 2019-10-16: 325 mg via ORAL
  Filled 2019-10-15: qty 1

## 2019-10-15 MED ORDER — ENOXAPARIN SODIUM 40 MG/0.4ML ~~LOC~~ SOLN
40.0000 mg | SUBCUTANEOUS | Status: DC
  Administered 2019-10-15: 22:00:00 40 mg via SUBCUTANEOUS
  Filled 2019-10-15 (×2): qty 0.4

## 2019-10-15 NOTE — Telephone Encounter (Signed)
Both pt and Ashley Osmond, RN are aware of below recommendations and voiced her understanding.  Nothing further is needed.

## 2019-10-15 NOTE — Patient Outreach (Addendum)
[facility]) [place] Telephone Outreach PCP office completes Transition of Care follow up post-hospital discharge Post- (most recent) hospital discharge day # [place] Telephone Outreach Care Coordination x 3   10/15/2019  LOUANN HOPSON [birth year] [id number]  Successful telephone outreach toDeborah Loyal Gambler, 68 y/o femalereferred to [place] by [facility] Liaison RN CM after hospitalization July 21-25, 2034for acute on chronic CHF exacerbation.Patientwas discharged from hospital to home/ self-care without home health services in place. Patient has history including, but not limited to, combined CHF; CAD with previous MI/ CABG, and ICD; HTN/ HLD; GERD; COPD;history oftobacco use- patient has quit smoking since June 19, 2019.  Unfortunately, patient experiencedmultiplerecenthospital readmissions: 1)August 13-15, 2017for acute respiratory failure with hypoxia/18 days after her hospital discharge, ; patient was again discharged home without home health services in place. 2) September 9-12, 2020 for acute pulmonary edema; respiratory failure with hypoxia; and CHF exacerbation/ 25 days after previous hospital discharge; was again discharged home without home health services in place, as patient continually refuses need for home health. 3) ED visit 08/02/2019 for chest pain; patient was also diagnosed with ETOH intoxication and discharged homeafter being ruled out for ACS/ MI 4)October 6-9,2020,24 days after her last hospital discharge, again for COPD/ CHF exacerbation. 5) October 18-21, 2020, 9 days after last hospital discharge; Acute Respiratory failure with flash pulmonary edema, secondary to CHF exacerbation/ severe MS/ MR: patient had cardiac catherization during this hospital admission and was determined to have significant 3-vessel CAD and EF of 20-25% 6) November 16-18, 2020 (26 days afterlast hospital  discharge), again with acute respiratory failure/ CHF exacerbation.  HIPAA/ identity verified with patient; today, she reports that she had an acute episode of SOB with chest pain, requiring initiation of action plan for yellow CHF zone:  Reports awoke, felt as though she could not breathe, SaO2 levels high 80%- low 90% (baseline 95-97%); patient followed action plan and took NTG/ rescue inhalers/ nebulizer x 2.  Reports slowly improving, but tired from episode this morning.  Discussed with patient that this is the typical time-frame that she has run into trouble and required hospital re-admission.  Patient sounds to be in no distress throughout phone call today and she is able to speak in complete sentences and she denies current pain and no new/ recent falls.  Reports weights at home between 159-161 lbs, (at baseline/ goal), with weight this morning of 161 lbs.  [place] Coordination x 3; -- called [phone]-- number previously provided by cardiology provider team for [facility]; person answering phone stated this is not the number for the cardiac MRI department, and she provided me with several other possible phone numbers to attempt -- 10:45 am:  Contacted Dr. Saralyn Pilar cardiology provider [phone]) to update on patient's symptoms/ episode SOB this morning; spoke with [facility] and explained entire situation, including request for follow up on cardiac MRI order at [facility], placed on 09/26/2019; Benjamine Mola states she will make Dr. Saralyn Pilar aware of patient's symptoms and will follow up on order for cardiac MRI at [facility].  Benjamine Mola also requests that I contact patient's pulmonary provider, which I will do-- however, I updated Benjamine Mola that Dr. Patsey Berthold with pulmonary feels patient's current issues are related to CHF decompensation due to ongoing need for follow up/ intervention around patient's significantly worsened cardiac cath performed in October while  patient was hospitalized -- 11:05: contacted pulmonology provider office [phone]) to update on  patient's acute episode of SOB this morning; spoke with Lesleigh Noe, CMA, who relayed message to Dr. Patsey Berthold- message received back from Dr. Patsey Berthold recommending that patient go to ED for evaluation; also confirming that patient's episodes are related to cardiac decompensation rather than primary pulmonary issues  Patient denies further issues, concerns, or problems today. I confirmed that patient hasmy direct phone number, the main [facility] CM office phone number, and the [facility] CM 24-hour nurse advice phone number should issues arise prior to next scheduled [facility] CM outreachwithin 2 weeks, if not sooner if indicated. Encouraged patient to contact me directly if needs, questions, issues, or concerns arise prior to next scheduled outreach; patient agreed to do so.  Plan:  Patient will take medications as prescribed and will attend all scheduled provider appointments  Patient will promptly notify care providers for any new concerns/ issues/ problems that arise  Patient will continue monitoring/ recording daily weightsand following action plan for CHF zones  Patient will continuenotsmoking  [place] outreach to continue with scheduled phone callin 2 weeks, sooner of indicated  [facility] CM Care Plan Problem One     Most Recent Value  Care Plan Problem One  High risk for hospital readmission related to/ as evidenced by multiple recent hospitalizations for Acute Respiratory Failure/ CHF exacerbation with most recent hospital discharge on 09/24/2019  Role Documenting the Problem One  Care Management Coordinator  Care Plan for Problem One  Active  [facility] Long Term Goal   Over the next 31 days, patient will not experience unplanned hospital readmission, as evidenced by patient reporting and review of EMR during [facility] RN CCM outreach  [facility] Long Term Goal Start Date  09/25/19  Interventions for  Problem One Long Term Goal  Discussed with patient her current clinical condition and her management his morning of episode SOB,  placed care coordination outreached x 3 to cardiac and pulmonary providers,  confirmed that patient followed established action plan for episode SOB    [facility] CM Care Plan Problem Two     Most Recent Value  Care Plan Problem Two  Need for ongoing reinforcement of self-health management strategies for chronic disease state of CHF/ COPD, as evidenced by patient reporting and recent hospitalization for CHF exacerbation  Role Documenting the Problem Two  Care Management Coordinator  Care Plan for Problem Two  Active  Interventions for Problem Two Long Term Goal   Confirmed that patient has continued monitoring and recording daily weights at home and reviewed recent weights at home with patient,  confirmed that patient remains able to verbalize appropriate action plan for weight gain in setting of CHF  [facility] Long Term Goal  Over the next 31 days, patient will continue to monitor and record daily weights at home and will follow action plan for yellow CHF zone, as evidenced by patient reporting and review of same during [place] CM outreach  [facility] Long Term Goal Start Date  09/25/19  [facility] CM Short Term Goal #1   Over the next 21 days, patient will verbalize plan to obtain needed cardiac MRI, as evidenced by patient reporting/ collaboration with cardiology provider team as indicated during [facility] RN CM outreach [Goal re-established today]  [facility] CM Short Term Goal #1 Start Date  10/15/19- goal re-established today  Interventions for Short Term Goal #2   Confirmed that patient has scheduled cardiac MRI at [facility]- but not until end of December,  confirmed that patient has not yet been contacted by cardiac  MRI team at [facility]- placed care coordination outreaches to cardiology and pulmonary providers to again attempt to facilitate prompt scheduling of cardiac MRI which was previously recommended in  early October     [name], RN, BSN, [place] Care Management  [phone]

## 2019-10-15 NOTE — ED Notes (Signed)
Pt denies chest pain at this time.

## 2019-10-15 NOTE — Consult Note (Signed)
[facility] Cardiology  CARDIOLOGY CONSULT NOTE  Patient ID: Ashley [name] MRN: [id number] DOB/AGE: [birth year] 66 y.o.  Admit date: 10/15/2019 Referring Physician Mal Misty Primary Physician [facility] Primary Cardiologist Emmanuel Ercole Reason for Consultation chest pain  HPI: 68 year old female referred for evaluation of chest pain.  The patient has known coronary disease, status post CABG x3 and mitral valve repair.  Recent cardiac catheterization 08/26/2019 revealed severe three-vessel coronary artery disease, with patent LIMA to LAD, occluded SVG to RCA and OM1 with ischemic cardiomyopathy.  2D echocardiogram revealed LVEF of 20 to 25%.  Right catheterization revealed moderate to severe pulmonary hypertension with mean PA pressure of 55 mmHg.  Patient has had multiple admissions for intermittent chest pain, and respiratory failure, multifactorial secondary to acute on chronic systolic congestive heart failure and COPD.  Today, the patient describes substernal chest discomfort which radiated to her left shoulder.  ECG revealed atrial sensing with ventricular pacing.  Initial troponin is borderline elevated 36.  Review of systems complete and found to be negative unless listed above     Past Medical History:  Diagnosis Date  . AICD (automatic cardioverter/defibrillator) present    on right side  . Anemia   . CAD (coronary artery disease)    s/p CABG  . CHF (congestive heart failure) ([place])   . Chronic kidney disease    renal artery stenosis  . Colon cancer ([place])   . Depression   . GERD (gastroesophageal reflux disease)   . Hx of colonic polyps   . Hyperlipidemia   . Hypertension   . Mitral valve disorder    s/p mitral valve repair wth CABG  . Myocardial infarction ([place])   . Peripheral vascular disease ([place])   . Presence of permanent cardiac pacemaker    Pacemaker/ Defibrillator  . Tobacco abuse 10/11/2014    Past Surgical History:  Procedure Laterality Date  . arm surgery     fracture, has  plates and screws  . CABG with mitral valve repair    . CARDIAC CATHETERIZATION    . COLON SURGERY     colon cancer  . COLONOSCOPY WITH PROPOFOL N/A 10/09/2016   Procedure: COLONOSCOPY WITH PROPOFOL;  Surgeon: Jonathon Bellows, MD;  Location: [facility] ENDOSCOPY;  Service: Endoscopy;  Laterality: N/A;  . COLONOSCOPY WITH PROPOFOL N/A 07/24/2018   Procedure: COLONOSCOPY WITH PROPOFOL;  Surgeon: Virgel Manifold, MD;  Location: [facility] ENDOSCOPY;  Service: Endoscopy;  Laterality: N/A;  . CORONARY ARTERY BYPASS GRAFT    . ENTEROSCOPY N/A 11/21/2018   Procedure: ENTEROSCOPY-BALLOON;  Surgeon: Jonathon Bellows, MD;  Location: [facility] ENDOSCOPY;  Service: Gastroenterology;  Laterality: N/A;  . ESOPHAGOGASTRODUODENOSCOPY (EGD) WITH PROPOFOL N/A 07/24/2018   Procedure: ESOPHAGOGASTRODUODENOSCOPY (EGD) WITH PROPOFOL;  Surgeon: Virgel Manifold, MD;  Location: [facility] ENDOSCOPY;  Service: Endoscopy;  Laterality: N/A;  . GIVENS CAPSULE STUDY N/A 08/16/2018   Procedure: GIVENS CAPSULE STUDY;  Surgeon: Virgel Manifold, MD;  Location: [facility] ENDOSCOPY;  Service: Endoscopy;  Laterality: N/A;  . pace maker defib  2016  . PERIPHERAL VASCULAR CATHETERIZATION N/A 10/23/2016   Procedure: Renal Angiography;  Surgeon: Algernon Huxley, MD;  Location: [place] CV LAB;  Service: Cardiovascular;  Laterality: N/A;  . RIGHT/LEFT HEART CATH AND CORONARY ANGIOGRAPHY N/A 08/26/2019   Procedure: RIGHT/LEFT HEART CATH AND CORONARY ANGIOGRAPHY;  Surgeon: Isaias Cowman, MD;  Location: [place] CV LAB;  Service: Cardiovascular;  Laterality: N/A;  . TOTAL ABDOMINAL HYSTERECTOMY  1999   history of abnormal pap    (Not in a  hospital admission)  Social History   Socioeconomic History  . Marital status: Single    Spouse name: Not on file  . Number of children: 0  . Years of education: 46  . Highest education level: 11th grade  Occupational History  . Occupation: disabled  Social Needs  . Financial resource strain: Not hard  at all  . Food insecurity    Worry: Never true    Inability: Never true  . Transportation needs    Medical: No    Non-medical: No  Tobacco Use  . Smoking status: Former Smoker    Packs/day: 0.25    Types: Cigarettes    Quit date: 06/19/2019    Years since quitting: 0.3  . Smokeless tobacco: Never Used  . Tobacco comment: reports smoking 2-3 cigarettes/ day  Substance and Sexual Activity  . Alcohol use: Yes    Alcohol/week: 2.0 standard drinks    Types: 2 Shots of liquor per week    Comment: occasion  . Drug use: No  . Sexual activity: Yes    Comment: 1 partner  Lifestyle  . Physical activity    Days per week: 0 days    Minutes per session: 0 min  . Stress: Only a little  Relationships  . Social Herbalist on phone: Once a week    Gets together: Once a week    Attends religious service: Never    Active member of club or organization: No    Attends meetings of clubs or organizations: Never    Relationship status: Living with partner  . Intimate partner violence    Fear of current or ex partner: No    Emotionally abused: No    Physically abused: No    Forced sexual activity: No  Other Topics Concern  . Not on file  Social History Narrative   07/01/2019: Patient reports she has stopped smoking completely since she was hospitalized on June 19, 2019    Family History  Problem Relation Age of Onset  . Arthritis Mother   . Cancer Mother        uterus cancer  . Hyperlipidemia Mother   . Hypertension Mother   . Heart disease Mother   . Diabetes Mother   . Hyperlipidemia Father   . Hypertension Father   . Heart disease Father   . Diabetes Father   . Cancer Sister        ovary cancer  . Diabetes Maternal Grandmother   . Hypertension Maternal Grandmother   . Arthritis Maternal Grandmother   . Hypertension Maternal Grandfather   . Hypertension Paternal Grandmother   . Hypertension Paternal Grandfather   . Heart disease Paternal Grandfather   . Heart  disease Brother   . Kidney disease Brother   . Breast cancer Neg Hx       Review of systems complete and found to be negative unless listed above      PHYSICAL EXAM  General: Well developed, well nourished, in no acute distress HEENT:  Normocephalic and atramatic Neck:  No JVD.  Lungs: Clear bilaterally to auscultation and percussion. Heart: HRRR . Normal S1 and S2 without gallops or murmurs.  Abdomen: Bowel sounds are positive, abdomen soft and non-tender  Msk:  Back normal, normal gait. Normal strength and tone for age. Extremities: No clubbing, cyanosis or edema.   Neuro: Alert and oriented X 3. Psych:  Good affect, responds appropriately  Labs:   Lab Results  Component Value Date  WBC 13.5 (H) 10/15/2019   HGB 14.9 10/15/2019   HCT 41.9 10/15/2019   MCV 89.1 10/15/2019   PLT 254 10/15/2019    Recent Labs  Lab 10/15/19 1417  NA 136  K 3.9  CL 98  CO2 21*  BUN 16  CREATININE 1.18*  CALCIUM 9.0  PROT 7.5  BILITOT 1.5*  ALKPHOS 80  ALT 16  AST 26  GLUCOSE 178*   Lab Results  Component Value Date   CKTOTAL 44 05/30/2013   CKMB 33.2 (H) 08/19/2014   TROPONINI <0.03 03/16/2019    Lab Results  Component Value Date   CHOL 148 11/08/2018   CHOL 222 (H) 05/06/2018   CHOL 229 (H) 06/06/2016   Lab Results  Component Value Date   HDL 39.60 11/08/2018   HDL 41 (L) 05/06/2018   HDL 35.30 (L) 06/06/2016   Lab Results  Component Value Date   LDLCALC 139 (H) 05/06/2018   LDLCALC 90 10/09/2014   LDLCALC 107 (H) 05/30/2013   Lab Results  Component Value Date   TRIG (H) 11/08/2018    550.0 Triglyceride is over 400; calculations on Lipids are invalid.   TRIG 276 (H) 05/06/2018   TRIG (H) 06/06/2016    477.0 Triglyceride is over 400; calculations on Lipids are invalid.   Lab Results  Component Value Date   CHOLHDL 4 11/08/2018   CHOLHDL 5.4 (H) 05/06/2018   CHOLHDL 6 06/06/2016   Lab Results  Component Value Date   LDLDIRECT 62.0 11/08/2018    LDLDIRECT 155.0 06/06/2016   LDLDIRECT 128 (H) 10/09/2014      Radiology: Dg Chest 2 View  Result Date: 09/22/2019 CLINICAL DATA:  Shortness of breath, cough. EXAM: CHEST - 2 VIEW COMPARISON:  Same day. FINDINGS: Stable cardiomegaly. Status post coronary bypass graft. No pneumothorax is noted. Right-sided pacemaker is unchanged in position. Stable diffuse interstitial densities are noted throughout both lungs concerning for pulmonary edema with probable small bilateral pleural effusions. Bony thorax is unremarkable. IMPRESSION: Stable cardiomegaly with bilateral pulmonary edema and probable small bilateral pleural effusions. Electronically Signed   By: Marijo Conception M.D.   On: 09/22/2019 13:35   Dg Chest Portable 1 View  Result Date: 10/15/2019 CLINICAL DATA:  Shortness of breath. Chest pain. EXAM: PORTABLE CHEST 1 VIEW COMPARISON:  One-view chest x-ray 09/22/2019 FINDINGS: Heart is enlarged. Pacing and defibrillator wires are stable diffuse interstitial pattern is again seen. No significant airspace consolidation present. There are no definite effusions. IMPRESSION: 1. Stable cardiomegaly. 2. Prominent interstitial pattern compatible with interstitial edema and congestive heart failure. Electronically Signed   By: San Morelle M.D.   On: 10/15/2019 14:38   Dg Chest Portable 1 View  Result Date: 09/22/2019 CLINICAL DATA:  Admitted to [facility] for shortness of breath, initially weaned off BiPAP, now worsening EXAM: PORTABLE CHEST 1 VIEW COMPARISON:  Radiograph 09/22/2019 FINDINGS: Cardiac pacer pack overlies the right chest wall with leads at the right atrium, coronary sinus and cardiac apex. Postsurgical changes related to prior CABG including intact and aligned sternotomy wires and multiple surgical clips projecting over the mediastinum. Distal left clavicular plate screw fixation hardware is noted. Degenerative changes are present in the imaged spine and shoulders. Worsening bilateral  interstitial and airspace opacities most pronounced in the right lung. Associated septal thickening is present with central vascular cuffing. Mild cardiomegaly, similar to prior accounting for differences in technique. No pneumothorax. No visible effusion. IMPRESSION: Worsening bilateral interstitial and airspace opacities, most pronounced in the right  lung, consistent with worsening pulmonary edema. Superimposed infection cannot be excluded. Electronically Signed   By: Lovena Le M.D.   On: 09/22/2019 18:16   Dg Chest Portable 1 View  Result Date: 09/22/2019 CLINICAL DATA:  Shortness of breath EXAM: PORTABLE CHEST 1 VIEW COMPARISON:  August 25, 2019 FINDINGS: There is cardiomegaly with pulmonary venous hypertension. There is generalized interstitial edema. There is atelectatic change in the right base and right upper lobe. There is no airspace consolidation. Pacemaker leads are attached to the right atrium, right ventricle, and coronary sinus. Patient is status post coronary artery bypass grafting. No adenopathy. There is aortic atherosclerosis. There is postoperative change in the left clavicle. IMPRESSION: Cardiomegaly with pulmonary venous hypertension. There is interstitial pulmonary edema. The appearance is felt to be indicative of a degree of congestive heart failure. Areas of atelectatic change on the right. No consolidation. Pacemaker leads attached to right atrium, right ventricle, and coronary sinus. Status post coronary artery bypass grafting. Aortic Atherosclerosis (ICD10-I70.0). Electronically Signed   By: Lowella Grip III M.D.   On: 09/22/2019 07:49    EKG: Atrial sensing with ventricular pacing  ASSESSMENT AND PLAN:   1.  Chest pain, typical and atypical features, nondiagnostic ECG, borderline elevated troponin 2.  Coronary artery disease, status post CABG x3, with recent cardiac catheterization revealing patent LIMA to LAD, occluded SVG to RCA and OM 1 3.  Ischemic  cardiomyopathy, LVEF 20 to 25%, status post BiV ICD 01/2016 4.  Acute on chronic systolic congestive heart failure, patient currently appears to be euvolemic 5.  COPD/moderate to severe pulmonary hypertension 6.  Status post mitral valve repair, mixed mitral stenosis and mitral regurgitation, of uncertain clinical significance, scheduled for cardiac [facility] at [facility] 11/03/2019  Recommendations  1.  Agree with overall current therapy 2.  Cycle troponins 3.  Defer full dose anticoagulation at this time 4.  Defer repeat cardiac catheterization at this time 5.  Nebulizer therapy as needed 6.  Diuretics as needed 7.  Cardiac MRI at [facility] pending 8.  Further recommendations pending patient's initial clinical course   Signed: Isaias Cowman MD,PhD, [facility] 10/15/2019, 4:12 PM

## 2019-10-15 NOTE — Telephone Encounter (Signed)
Received call from [place], [place] from [facility].  Richarda Osmond stated she spoke to Dr. Saralyn Pilar nurse regarding cardiac MRI and current symptoms . Pt has been waiting on MRI since October.  Dr. Saralyn Pilar nurse recommended that Richarda Osmond reach out to our office to make [place] aware that pt had a episode of extreme sob and chest pain this morning.  Per Richarda Osmond, during this episode pt's spo2 dropped to high 80's low 90's. Pt has taken 2 nebulizer treatments, Nitro and albuterol HFA with relief in symptoms.  Pt is 21 day post discharge. Richarda Osmond is concerned that pt will be headed for re admission based off of current symptoms.     Dr. Patsey Berthold, please advise. Thanks

## 2019-10-15 NOTE — Telephone Encounter (Signed)
She needs to go to the emergency room.  ALL of her admissions have been due to CHF decompensation.  Her pulmonary problems are the least of her issues.  Extreme shortness of breath and CHEST pain is related likely to pulmonary edema and cardiac issues.  She needs to be reevaluated in the emergency room as stated above.

## 2019-10-15 NOTE — ED Provider Notes (Signed)
[facility] Emergency Department Provider Note  ____________________________________________   First MD Initiated Contact with Patient 10/15/19 1346     (approximate)  I have reviewed the triage vital signs and the nursing notes.   HISTORY  Chief Complaint Chest Pain    HPI Ashley [name] is a 67 y.o. female  Here with recurrent CP and SOB. Pt has long h/o recurrent visits for same with known CAD and valvular disease. She states she felt fine until around 6 this AM, when she had acute onset of SOB, and a dull, burning type CP. The pain feels similar to her heart pain. She feels like she cannot catch her breath. She's had increased dry cough as well. Took a nitro at home with mild relief. Called EMS and has since received duonebs w/ improvement. Pain now 2/10. No worsening factors. No fever or chills. No other complaints.        Past Medical History:  Diagnosis Date  . AICD (automatic cardioverter/defibrillator) present    on right side  . Anemia   . CAD (coronary artery disease)    s/p CABG  . CHF (congestive heart failure) ([place])   . Chronic kidney disease    renal artery stenosis  . Colon cancer ([place])   . Depression   . GERD (gastroesophageal reflux disease)   . Hx of colonic polyps   . Hyperlipidemia   . Hypertension   . Mitral valve disorder    s/p mitral valve repair wth CABG  . Myocardial infarction ([place])   . Peripheral vascular disease ([place])   . Presence of permanent cardiac pacemaker    Pacemaker/ Defibrillator  . Tobacco abuse 10/11/2014    Patient Active Problem List   Diagnosis Date Noted  . Mitral stenosis with insufficiency   . Hypertensive urgency   . Chronic renal failure, stage 3a   . Depression   . Respiratory failure with hypoxia ([place]) 09/22/2019  . Hyperglycemia 09/22/2019  . Presence of permanent cardiac pacemaker   . Anemia   . Chronic kidney disease   . Hypertension   . Mitral valve disorder 08/25/2019  .  Acute respiratory failure ([place]) 08/24/2019  . Acute CHF ([place]) 08/12/2019  . CHF (congestive heart failure) ([place]) 07/16/2019  . Cough with hemoptysis 06/09/2019  . Insomnia 05/07/2019  . Hypokalemia 03/23/2019  . Hyperglycemia, drug-induced 03/23/2019  . COVID-19 virus not detected 03/23/2019  . Respiratory failure ([place]) 03/16/2019  . AVM (arteriovenous malformation) of small bowel, acquired   . Anemia, iron deficiency 11/10/2018  . Acute on chronic systolic CHF (congestive heart failure) ([place]) 11/06/2018  . Rectal polyp   . Benign neoplasm of cecum   . Barrett's esophagus without dysplasia   . Stomach irritation   . Chronic diastolic heart failure ([place]) 07/03/2018  . HTN (hypertension) 07/03/2018  . 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 B12 deficiency anemia 06/28/2018  . Hypotension 05/11/2018  . Prediabetes 05/11/2018  . Personal history of colon cancer   . Benign neoplasm of descending colon   . Polyp of sigmoid colon   . Benign neoplasm of transverse colon   . Diverticulosis of large intestine without diverticulitis   . 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 Renal artery stenosis ([place]) 10/03/2016  . 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 Hospital discharge follow-up 02/09/2015  . Cough in adult 10/21/2014  . Tobacco abuse counseling 10/11/2014  . Tobacco abuse 10/11/2014  . Chronic right hip pain 08/26/2014  . Atherosclerosis  of native artery of extremity with intermittent claudication ([place]) 06/18/2013  . Preoperative evaluation to rule out surgical contraindication 06/18/2013  . CAD (coronary artery disease) 06/01/2013  . GERD (gastroesophageal reflux disease) 06/01/2013  . Hypercholesterolemia 06/01/2013  . Tubular adenoma of colon 06/01/2013  . 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 arm surgery     fracture, has plates and screws  . CABG with mitral  valve repair    . CARDIAC CATHETERIZATION    . COLON SURGERY     colon cancer  . COLONOSCOPY WITH PROPOFOL N/A 10/09/2016   Procedure: COLONOSCOPY WITH PROPOFOL;  Surgeon: Jonathon Bellows, MD;  Location: [facility] ENDOSCOPY;  Service: Endoscopy;  Laterality: N/A;  . COLONOSCOPY WITH PROPOFOL N/A 07/24/2018   Procedure: COLONOSCOPY WITH PROPOFOL;  Surgeon: Virgel Manifold, MD;  Location: [facility] ENDOSCOPY;  Service: Endoscopy;  Laterality: N/A;  . CORONARY ARTERY BYPASS GRAFT    . ENTEROSCOPY N/A 11/21/2018   Procedure: ENTEROSCOPY-BALLOON;  Surgeon: Jonathon Bellows, MD;  Location: [facility] ENDOSCOPY;  Service: Gastroenterology;  Laterality: N/A;  . ESOPHAGOGASTRODUODENOSCOPY (EGD) WITH PROPOFOL N/A 07/24/2018   Procedure: ESOPHAGOGASTRODUODENOSCOPY (EGD) WITH PROPOFOL;  Surgeon: Virgel Manifold, MD;  Location: [facility] ENDOSCOPY;  Service: Endoscopy;  Laterality: N/A;  . GIVENS CAPSULE STUDY N/A 08/16/2018   Procedure: GIVENS CAPSULE STUDY;  Surgeon: Virgel Manifold, MD;  Location: [facility] ENDOSCOPY;  Service: Endoscopy;  Laterality: N/A;  . pace maker defib  2016  . PERIPHERAL VASCULAR CATHETERIZATION N/A 10/23/2016   Procedure: Renal Angiography;  Surgeon: Algernon Huxley, MD;  Location: [facility] CV LAB;  Service: Cardiovascular;  Laterality: N/A;  . RIGHT/LEFT HEART CATH AND CORONARY ANGIOGRAPHY N/A 08/26/2019   Procedure: RIGHT/LEFT HEART CATH AND CORONARY ANGIOGRAPHY;  Surgeon: Isaias Cowman, MD;  Location: [facility];  Service: Cardiovascular;  Laterality: N/A;  . TOTAL ABDOMINAL HYSTERECTOMY  1999   history of abnormal pap    Prior to Admission medications   Medication Sig Start Date End Date Taking? Authorizing Provider  albuterol (VENTOLIN HFA) 108 (90 Base) MCG/ACT inhaler INHALE 2 PUFFS BY MOUTH INTO THE LUNGS EVERY 6 HOURS AS NEEDED FOR WHEEZING 09/04/19   Tyler Pita, MD  ALPRAZolam Duanne Moron) 0.5 MG tablet Take 0.5-1 tablets (0.25-0.5 mg total) by mouth at bedtime. TAKE  ONE TABLET BY MOUTH AT BEDTIME AS NEEDED FOR ANXIETY Patient taking differently: Take 0.25-0.5 mg by mouth at bedtime.  09/05/19   [name], MD  aspirin 81 MG tablet Take 81 mg by mouth daily.    [provider]  atorvastatin (LIPITOR) 40 MG tablet TAKE ONE TABLET BY MOUTH ONCE DAILY Patient taking differently: Take 40 mg by mouth daily at 6 PM.  07/25/19   [name], MD  carvedilol (COREG) 6.25 MG tablet Take 1 tablet (6.25 mg total) by mouth 2 (two) times daily with a meal. 09/05/19   [name], MD  clopidogrel (PLAVIX) 75 MG tablet Take 1 tablet (75 mg total) by mouth daily. 09/05/19   Alisa Graff, FNP  cyanocobalamin (,VITAMIN B-12,) 1000 MCG/ML injection Inject 1 mL into the muscle monthly 09/05/19   [name], MD  diphenhydrAMINE (DIPHENHIST) 25 mg capsule Take 25 mg by mouth at bedtime as needed for allergies or sleep.     [provider]  escitalopram (LEXAPRO) 10 MG tablet Take 1 tablet (10 mg total) by mouth daily. 09/05/19   Deborra Medina  L, MD  ferrous sulfate 325 (65 FE) MG EC tablet Take 325 mg by mouth daily with breakfast.    [provider]  Fluticasone-Umeclidin-Vilant (TRELEGY ELLIPTA) 100-62.5-25 MCG/INH AEPB Inhale 1 puff into the lungs daily. 09/04/19   Tyler Pita, MD  furosemide (LASIX) 40 MG tablet Take 1 tablet (40 mg total) by mouth 2 (two) times daily. 09/24/19   Fritzi Mandes, MD  ipratropium-albuterol (DUONEB) 0.5-2.5 (3) MG/3ML SOLN Take 3 mLs by nebulization every 6 (six) hours as needed. 08/05/18   [name], MD  nitroGLYCERIN (NITROSTAT) 0.4 MG SL tablet Place 1 tablet (0.4 mg total) under the tongue every 5 (five) minutes as needed for chest pain. 09/03/19   [name], MD  omeprazole (PRILOSEC) 40 MG capsule Take 1 capsule (40 mg total) by mouth daily. 09/05/19   [name], MD  potassium chloride (KLOR-CON) 10 MEQ tablet Take 1 tablet (10 mEq total) by mouth daily. 09/05/19   [name], MD  sacubitril-valsartan (ENTRESTO) 24-26 MG Take 1 tablet by mouth 2 (two) times daily. 09/05/19   Alisa Graff, FNP  spironolactone (ALDACTONE) 25 MG tablet Take 0.5 tablets (12.5 mg total) by mouth daily. 09/05/19 09/04/20  Alisa Graff, FNP  traZODone (DESYREL) 50 MG tablet Take 0.5-1 tablets (25-50 mg total) by mouth daily. One hour before bedtime 09/05/19   [name], MD    Allergies Vancomycin and Hydrocodone-acetaminophen  Family History  Problem Relation Age of Onset  . Arthritis Mother   . Cancer Mother        uterus cancer  . Hyperlipidemia Mother   . Hypertension Mother   . Heart disease Mother   . Diabetes Mother   . Hyperlipidemia Father   . Hypertension Father   . Heart disease Father   . Diabetes Father   . Cancer Sister        ovary cancer  . Diabetes Maternal Grandmother   . Hypertension Maternal Grandmother   . Arthritis Maternal Grandmother   . Hypertension Maternal Grandfather   . Hypertension Paternal Grandmother   . Hypertension Paternal Grandfather   . Heart disease Paternal Grandfather   . Heart disease Brother   . Kidney disease Brother   . Breast cancer Neg Hx     Social History Social History   Tobacco Use  . Smoking status: Former Smoker    Packs/day: 0.25    Types: Cigarettes    Quit date: 06/19/2019    Years since quitting: 0.3  . Smokeless tobacco: Never Used  . Tobacco comment: reports smoking 2-3 cigarettes/ day  Substance Use Topics  . Alcohol use: Yes    Alcohol/week: 2.0 standard drinks    Types: 2 Shots of liquor per week    Comment: occasion  . Drug use: No    Review of Systems  Review of Systems  Constitutional: Positive for fatigue. Negative for fever.  HENT: Negative for congestion and sore throat.   Eyes: Negative for visual disturbance.  Respiratory: Positive for cough, chest tightness and shortness of breath.   Cardiovascular: Positive for chest pain.  Gastrointestinal: Negative for abdominal  pain, diarrhea, nausea and vomiting.  Genitourinary: Negative for flank pain.  Musculoskeletal: Negative for back pain and neck pain.  Skin: Negative for rash and wound.  Neurological: Negative for weakness.  All other systems reviewed and are negative.    ____________________________________________  PHYSICAL EXAM:      VITAL SIGNS: ED Triage Vitals  Enc  Vitals Group     BP 10/15/19 1353 (!) 182/115     Pulse Rate 10/15/19 1353 (!) 106     Resp 10/15/19 1353 (!) 32     Temp 10/15/19 1353 97.9 F (36.6 C)     Temp Source 10/15/19 1353 Oral     SpO2 10/15/19 1353 92 %     Weight 10/15/19 1354 162 lb (73.5 kg)     Height 10/15/19 1354 5\' 4"  (1.626 m)     Head Circumference --      Peak Flow --      Pain Score 10/15/19 1354 2     Pain Loc --      Pain Edu? --      Excl. in [place]? --      Physical Exam Vitals signs and nursing note reviewed.  Constitutional:      General: She is not in acute distress.    Appearance: She is well-developed.  HENT:     Head: Normocephalic and atraumatic.  Eyes:     Conjunctiva/sclera: Conjunctivae normal.  Neck:     Musculoskeletal: Neck supple.  Cardiovascular:     Rate and Rhythm: Regular rhythm. Tachycardia present.     Heart sounds: Normal heart sounds. No murmur. No friction rub.  Pulmonary:     Effort: Pulmonary effort is normal. No respiratory distress.     Breath sounds: Decreased breath sounds and wheezing present. No rales.  Abdominal:     General: There is no distension.     Palpations: Abdomen is soft.     Tenderness: There is no abdominal tenderness.  Musculoskeletal:     Right lower leg: Edema present.     Left lower leg: Edema present.  Skin:    General: Skin is warm.     Capillary Refill: Capillary refill takes less than 2 seconds.  Neurological:     Mental Status: She is alert and oriented to person, place, and time.     Motor: No abnormal muscle tone.       ____________________________________________    LABS (all labs ordered are listed, but only abnormal results are displayed)  Labs Reviewed  CBC WITH DIFFERENTIAL/PLATELET - Abnormal; Notable for the following components:      Result Value   WBC 13.5 (*)    Neutro Abs 10.6 (*)    All other components within normal limits  COMPREHENSIVE METABOLIC PANEL - Abnormal; Notable for the following components:   CO2 21 (*)    Glucose, Bld 178 (*)    Creatinine, Ser 1.18 (*)    Total Bilirubin 1.5 (*)    GFR calc non Af Amer 48 (*)    GFR calc Af Amer 56 (*)    Anion gap 17 (*)    All other components within normal limits  BRAIN NATRIURETIC PEPTIDE - Abnormal; Notable for the following components:   B Natriuretic Peptide >4,500.0 (*)    All other components within normal limits  TROPONIN I (HIGH SENSITIVITY) - Abnormal; Notable for the following components:   Troponin I (High Sensitivity) 36 (*)    All other components within normal limits  SARS CORONAVIRUS 2 (TAT 6-24 HRS)  BASIC METABOLIC PANEL  TROPONIN I (HIGH SENSITIVITY)    ____________________________________________  EKG: Normal sinus rhythm, VR 99. QRS 186, QTc 547. IV conduction delay. No significant change from prior. ________________________________________  RADIOLOGY All imaging, including plain films, CT scans, and ultrasounds, independently reviewed by me, and interpretations confirmed via formal radiology reads.  ED MD interpretation:   CXR: CHF with pulm edema  Official radiology report(s): Dg Chest Portable 1 View  Result Date: 10/15/2019 CLINICAL DATA:  Shortness of breath. Chest pain. EXAM: PORTABLE CHEST 1 VIEW COMPARISON:  One-view chest x-ray 09/22/2019 FINDINGS: Heart is enlarged. Pacing and defibrillator wires are stable diffuse interstitial pattern is again seen. No significant airspace consolidation present. There are no definite effusions. IMPRESSION: 1. Stable cardiomegaly. 2. Prominent interstitial pattern compatible with interstitial edema and  congestive heart failure. Electronically Signed   By: San Morelle M.D.   On: 10/15/2019 14:38    ____________________________________________  PROCEDURES   Procedure(s) performed (including Critical Care):  Procedures  ____________________________________________  INITIAL IMPRESSION / MDM / [place] / ED COURSE  As part of my medical decision making, I reviewed the following data within the [facility] notes reviewed and incorporated, Old chart reviewed, Notes from prior ED visits, and [place] Controlled Substance Database       *CHEYRL BULEY was evaluated in Emergency Department on 10/15/2019 for the symptoms described in the history of present illness. She was evaluated in the context of the global COVID-19 pandemic, which necessitated consideration that the patient might be at risk for infection with the SARS-CoV-2 virus that causes COVID-19. Institutional protocols and algorithms that pertain to the evaluation of patients at risk for COVID-19 are in a state of rapid change based on information released by regulatory bodies including the CDC and federal and state organizations. These policies and algorithms were followed during the patient's care in the ED.  Some ED evaluations and interventions may be delayed as a result of limited staffing during the pandemic.*  Clinical Course as of Oct 14 1699  Wed Oct 14, 5862  1227 68 year old female with extensive coronary history as well as valvular disease here with acute on chronic shortness of breath and chest pain.  Suspect acute on chronic angina versus CHF versus COPD with chest wall pain.  Troponin slightly elevated compared to baseline, but labs are otherwise reassuring.  BNP is pending.  Chest x-ray shows pulmonary edema.  Lasix given and will admit.  Given her recurrent ED visits and admissions, I discussed w/ Palliative who will see.   [CI]    Clinical Course User Index [CI] Duffy Bruce, MD    Medical Decision Making:  As above. Admit for CHF. Lasix, nitro givne.  ____________________________________________  FINAL CLINICAL IMPRESSION(S) / ED DIAGNOSES  Final diagnoses:  Acute on chronic systolic congestive heart failure (HCC)  Atypical chest pain     MEDICATIONS GIVEN DURING THIS VISIT:  Medications  nitroGLYCERIN (NITROSTAT) SL tablet 0.4 mg (0.4 mg Sublingual Given 10/15/19 1516)  aspirin EC tablet 81 mg (has no administration in time range)  atorvastatin (LIPITOR) tablet 40 mg (has no administration in time range)  carvedilol (COREG) tablet 6.25 mg (has no administration in time range)  sacubitril-valsartan (ENTRESTO) 24-26 mg per tablet (has no administration in time range)  spironolactone (ALDACTONE) tablet 12.5 mg (has no administration in time range)  ALPRAZolam (XANAX) tablet 0.25-0.5 mg (has no administration in time range)  escitalopram (LEXAPRO) tablet 10 mg (has no administration in time range)  traZODone (DESYREL) tablet 25-50 mg (has no administration in time range)  pantoprazole (PROTONIX) EC tablet 40 mg (has no administration in time range)  clopidogrel (PLAVIX) tablet 75 mg (has no administration in time range)  ferrous sulfate EC tablet 325 mg (has no administration in time range)  potassium chloride (  KLOR-CON) CR tablet 10 mEq (has no administration in time range)  diphenhydrAMINE (BENADRYL) capsule 25 mg (has no administration in time range)  Fluticasone-Umeclidin-Vilant 100-62.5-25 MCG/INH AEPB 1 puff (has no administration in time range)  ipratropium-albuterol (DUONEB) 0.5-2.5 (3) MG/3ML nebulizer solution 3 mL (has no administration in time range)  sodium chloride flush (NS) 0.9 % injection 3 mL (has no administration in time range)  sodium chloride flush (NS) 0.9 % injection 3 mL (has no administration in time range)  0.9 %  sodium chloride infusion (has no administration in time range)  acetaminophen (TYLENOL) tablet 650 mg (has  no administration in time range)  ondansetron (ZOFRAN) injection 4 mg (has no administration in time range)  enoxaparin (LOVENOX) injection 40 mg (has no administration in time range)  furosemide (LASIX) injection 40 mg (has no administration in time range)  ipratropium-albuterol (DUONEB) 0.5-2.5 (3) MG/3ML nebulizer solution 3 mL (3 mLs Nebulization Given 10/15/19 1530)  aspirin chewable tablet 324 mg (324 mg Oral Given 10/15/19 1525)  furosemide (LASIX) injection 40 mg (40 mg Intravenous Given 10/15/19 1525)     ED Discharge Orders    None       Note:  This document was prepared using Dragon voice recognition software and may include unintentional dictation errors.   Duffy Bruce, MD 10/15/19 1700

## 2019-10-15 NOTE — ED Triage Notes (Signed)
Pt to ED from home by EMS with c/o of chest pain that she rated at an 8. Pt took 1 nitro and 1 duo neb prior to EMS arrival. EMS administered 2 duo nebs and 125 of solumedrol in route and pt stated CP decreased to a 2 and states upon arrival is still at a 2.

## 2019-10-15 NOTE — ED Notes (Signed)
Sent pharmacy message regarding missing dose of Entresto .

## 2019-10-15 NOTE — H&P (Signed)
History and Physical:    Ashley [name]   MGQ:[id number] DOB: 1951/02/10 DOA: 10/15/2019  Referring MD/provider: Dr. Duffy Bruce PCP: [name], MD   Patient coming from: Home  Chief Complaint: Chest pain  History of Present Illness:   Ashley [name] is an 68 y.o. female with medical history significant for COPD, pulmonary hypertension, chronic systolic CHF with EF of 40 to 45%, significant CAD, status post ICD placement, hypertension, recurrent admissions to the hospital for heart problems.  She presented to the hospital today with shortness of breath and chest pain.  She said her symptoms started this morning around 6:00 when she woke up.  Chest pain is described as which dull and then later became sharp in nature.  It was initially severe but it has improved and currently her she has no chest pain.  It was nonradiating and there were no known relieving or aggravating factors.  She she had a mild cough productive of clear to brownish sputum.  She said she was wheezing when she woke up this morning but this has improved.  She has no other complaints. Shortness of breath has also improved since she got IV Lasix in the emergency room.   ED Course: In the ED, she was found to have severe hypertension and her chest x-ray showed interstitial edema.  She was given IV Lasix, aspirin, DuoNeb and nitroglycerin sublingual in the emergency room.  ROS:   ROS all other systems reviewed were negative  Past Medical History:   Past Medical History:  Diagnosis Date  . AICD (automatic cardioverter/defibrillator) present    on right side  . Anemia   . CAD (coronary artery disease)    s/p CABG  . CHF (congestive heart failure) ([place])   . Chronic kidney disease    renal artery stenosis  . Colon cancer ([place])   . Depression   . GERD (gastroesophageal reflux disease)   . Hx of colonic polyps   . Hyperlipidemia   . Hypertension   . Mitral valve disorder    s/p mitral valve repair  wth CABG  . Myocardial infarction ([place])   . Peripheral vascular disease ([place])   . Presence of permanent cardiac pacemaker    Pacemaker/ Defibrillator  . Tobacco abuse 10/11/2014    Past Surgical History:   Past Surgical History:  Procedure Laterality Date  . arm surgery     fracture, has plates and screws  . CABG with mitral valve repair    . CARDIAC CATHETERIZATION    . COLON SURGERY     colon cancer  . COLONOSCOPY WITH PROPOFOL N/A 10/09/2016   Procedure: COLONOSCOPY WITH PROPOFOL;  Surgeon: Jonathon Bellows, MD;  Location: [facility] ENDOSCOPY;  Service: Endoscopy;  Laterality: N/A;  . COLONOSCOPY WITH PROPOFOL N/A 07/24/2018   Procedure: COLONOSCOPY WITH PROPOFOL;  Surgeon: Virgel Manifold, MD;  Location: [facility] ENDOSCOPY;  Service: Endoscopy;  Laterality: N/A;  . CORONARY ARTERY BYPASS GRAFT    . ENTEROSCOPY N/A 11/21/2018   Procedure: ENTEROSCOPY-BALLOON;  Surgeon: Jonathon Bellows, MD;  Location: [facility] ENDOSCOPY;  Service: Gastroenterology;  Laterality: N/A;  . ESOPHAGOGASTRODUODENOSCOPY (EGD) WITH PROPOFOL N/A 07/24/2018   Procedure: ESOPHAGOGASTRODUODENOSCOPY (EGD) WITH PROPOFOL;  Surgeon: Virgel Manifold, MD;  Location: [facility] ENDOSCOPY;  Service: Endoscopy;  Laterality: N/A;  . GIVENS CAPSULE STUDY N/A 08/16/2018   Procedure: GIVENS CAPSULE STUDY;  Surgeon: Virgel Manifold, MD;  Location: [facility] ENDOSCOPY;  Service: Endoscopy;  Laterality: N/A;  . pace maker defib  2016  .  PERIPHERAL VASCULAR CATHETERIZATION N/A 10/23/2016   Procedure: Renal Angiography;  Surgeon: Algernon Huxley, MD;  Location: [facility];  Service: Cardiovascular;  Laterality: N/A;  . RIGHT/LEFT HEART CATH AND CORONARY ANGIOGRAPHY N/A 08/26/2019   Procedure: RIGHT/LEFT HEART CATH AND CORONARY ANGIOGRAPHY;  Surgeon: Isaias Cowman, MD;  Location: [facility];  Service: Cardiovascular;  Laterality: N/A;  . TOTAL ABDOMINAL HYSTERECTOMY  1999   history of abnormal pap    Social History:    Social History   Socioeconomic History  . Marital status: Single    Spouse name: Not on file  . Number of children: 0  . Years of education: 57  . Highest education level: 11th grade  Occupational History  . Occupation: disabled  Social Needs  . Financial resource strain: Not hard at all  . Food insecurity    Worry: Never true    Inability: Never true  . Transportation needs    Medical: No    Non-medical: No  Tobacco Use  . Smoking status: Former Smoker    Packs/day: 0.25    Types: Cigarettes    Quit date: 06/19/2019    Years since quitting: 0.3  . Smokeless tobacco: Never Used  . Tobacco comment: reports smoking 2-3 cigarettes/ day  Substance and Sexual Activity  . Alcohol use: Yes    Alcohol/week: 2.0 standard drinks    Types: 2 Shots of liquor per week    Comment: occasion  . Drug use: No  . Sexual activity: Yes    Comment: 1 partner  Lifestyle  . Physical activity    Days per week: 0 days    Minutes per session: 0 min  . Stress: Only a little  Relationships  . Social Herbalist on phone: Once a week    Gets together: Once a week    Attends religious service: Never    Active member of club or organization: No    Attends meetings of clubs or organizations: Never    Relationship status: Living with partner  . Intimate partner violence    Fear of current or ex partner: No    Emotionally abused: No    Physically abused: No    Forced sexual activity: No  Other Topics Concern  . Not on file  Social History Narrative   07/01/2019: Patient reports she has stopped smoking completely since she was hospitalized on June 19, 2019    Allergies   Vancomycin and Hydrocodone-acetaminophen  Family history:   Family History  Problem Relation Age of Onset  . Arthritis Mother   . Cancer Mother        uterus cancer  . Hyperlipidemia Mother   . Hypertension Mother   . Heart disease Mother   . Diabetes Mother   . Hyperlipidemia Father   .  Hypertension Father   . Heart disease Father   . Diabetes Father   . Cancer Sister        ovary cancer  . Diabetes Maternal Grandmother   . Hypertension Maternal Grandmother   . Arthritis Maternal Grandmother   . Hypertension Maternal Grandfather   . Hypertension Paternal Grandmother   . Hypertension Paternal Grandfather   . Heart disease Paternal Grandfather   . Heart disease Brother   . Kidney disease Brother   . Breast cancer Neg Hx     Current Medications:   Prior to Admission medications   Medication Sig Start Date End Date Taking? Authorizing Provider  albuterol (  VENTOLIN HFA) 108 (90 Base) MCG/ACT inhaler INHALE 2 PUFFS BY MOUTH INTO THE LUNGS EVERY 6 HOURS AS NEEDED FOR WHEEZING 09/04/19   Tyler Pita, MD  ALPRAZolam Duanne Moron) 0.5 MG tablet Take 0.5-1 tablets (0.25-0.5 mg total) by mouth at bedtime. TAKE ONE TABLET BY MOUTH AT BEDTIME AS NEEDED FOR ANXIETY Patient taking differently: Take 0.25-0.5 mg by mouth at bedtime.  09/05/19   [name], MD  aspirin 81 MG tablet Take 81 mg by mouth daily.    [provider]  atorvastatin (LIPITOR) 40 MG tablet TAKE ONE TABLET BY MOUTH ONCE DAILY Patient taking differently: Take 40 mg by mouth daily at 6 PM.  07/25/19   [name], MD  carvedilol (COREG) 6.25 MG tablet Take 1 tablet (6.25 mg total) by mouth 2 (two) times daily with a meal. 09/05/19   [name], MD  clopidogrel (PLAVIX) 75 MG tablet Take 1 tablet (75 mg total) by mouth daily. 09/05/19   Alisa Graff, FNP  cyanocobalamin (,VITAMIN B-12,) 1000 MCG/ML injection Inject 1 mL into the muscle monthly 09/05/19   [name], MD  diphenhydrAMINE (DIPHENHIST) 25 mg capsule Take 25 mg by mouth at bedtime as needed for allergies or sleep.     [provider]  escitalopram (LEXAPRO) 10 MG tablet Take 1 tablet (10 mg total) by mouth daily. 09/05/19   [name], MD  ferrous sulfate 325 (65 FE) MG EC tablet Take 325 mg by mouth daily  with breakfast.    [provider]  Fluticasone-Umeclidin-Vilant (TRELEGY ELLIPTA) 100-62.5-25 MCG/INH AEPB Inhale 1 puff into the lungs daily. 09/04/19   Tyler Pita, MD  furosemide (LASIX) 40 MG tablet Take 1 tablet (40 mg total) by mouth 2 (two) times daily. 09/24/19   Fritzi Mandes, MD  ipratropium-albuterol (DUONEB) 0.5-2.5 (3) MG/3ML SOLN Take 3 mLs by nebulization every 6 (six) hours as needed. 08/05/18   [name], MD  nitroGLYCERIN (NITROSTAT) 0.4 MG SL tablet Place 1 tablet (0.4 mg total) under the tongue every 5 (five) minutes as needed for chest pain. 09/03/19   [name], MD  omeprazole (PRILOSEC) 40 MG capsule Take 1 capsule (40 mg total) by mouth daily. 09/05/19   [name], MD  potassium chloride (KLOR-CON) 10 MEQ tablet Take 1 tablet (10 mEq total) by mouth daily. 09/05/19   [name], MD  sacubitril-valsartan (ENTRESTO) 24-26 MG Take 1 tablet by mouth 2 (two) times daily. 09/05/19   Alisa Graff, FNP  spironolactone (ALDACTONE) 25 MG tablet Take 0.5 tablets (12.5 mg total) by mouth daily. 09/05/19 09/04/20  Alisa Graff, FNP  traZODone (DESYREL) 50 MG tablet Take 0.5-1 tablets (25-50 mg total) by mouth daily. One hour before bedtime 09/05/19   [name], MD    Physical Exam:   Vitals:   10/15/19 1354 10/15/19 1400 10/15/19 1430 10/15/19 1500  BP:  (!) 144/111 (!) 163/103 (!) 175/121  Pulse:  (!) 103 96 99  Resp:  13 (!) 29 (!) 29  Temp:      TempSrc:      SpO2:  94% 94% 92%  Weight: 73.5 kg     Height: 5\' 4"  (1.626 m)        Physical Exam: Blood pressure (!) 175/121, pulse 99, temperature 97.9 F (36.6 C), temperature source Oral, resp. rate (!) 29, height 5\' 4"  (1.626 m), weight 73.5 kg, SpO2 92 %. Gen: No acute distress. Head: Normocephalic,  atraumatic. Eyes: Pupils equal, round and reactive to light. Extraocular movements intact.  Sclerae nonicteric. No lid lag. Mouth: No lesions seen. Neck: Supple, no  thyromegaly, no lymphadenopathy, no jugular venous distention. Chest: Decreased air entry bilaterally, no wheezing or crackles CV: Heart sounds are regular with an S1, S2. No murmurs, rubs,or gallops. Abdomen: Soft, nontender, obese, normal active bowel sounds. No palpable masses. Extremities: Extremities are without clubbing, or cyanosis. No edema. Pedal pulses 2+. Skin: Warm and dry. No rashes, lesions or wounds. Neuro: Alert and oriented times 3; grossly nonfocal.  Psych: Insight is good and judgment is appropriate. Mood and affect normal.   Data Review:    Labs: Basic Metabolic Panel: Recent Labs  Lab 10/15/19 1417  NA 136  K 3.9  CL 98  CO2 21*  GLUCOSE 178*  BUN 16  CREATININE 1.18*  CALCIUM 9.0   Liver Function Tests: Recent Labs  Lab 10/15/19 1417  AST 26  ALT 16  ALKPHOS 80  BILITOT 1.5*  PROT 7.5  ALBUMIN 4.5   No results for input(s): LIPASE, AMYLASE in the last 168 hours. No results for input(s): AMMONIA in the last 168 hours. CBC: Recent Labs  Lab 10/15/19 1417  WBC 13.5*  NEUTROABS 10.6*  HGB 14.9  HCT 41.9  MCV 89.1  PLT 254   Cardiac Enzymes: No results for input(s): CKTOTAL, CKMB, CKMBINDEX, TROPONINI in the last 168 hours.  BNP (last 3 results) No results for input(s): PROBNP in the last 8760 hours. CBG: No results for input(s): GLUCAP in the last 168 hours.  Urinalysis    Component Value Date/Time   COLORURINE YELLOW (A) 05/29/2019 0810   APPEARANCEUR CLEAR (A) 05/29/2019 0810   APPEARANCEUR Clear 08/19/2014 0025   LABSPEC 1.006 05/29/2019 0810   LABSPEC 1.009 08/19/2014 0025   PHURINE 6.0 05/29/2019 0810   GLUCOSEU NEGATIVE 05/29/2019 0810   GLUCOSEU 50 mg/dL 08/19/2014 0025   HGBUR SMALL (A) 05/29/2019 0810   BILIRUBINUR NEGATIVE 05/29/2019 0810   BILIRUBINUR Negative 08/19/2014 0025   KETONESUR NEGATIVE 08/24/2019 1442   PROTEINUR NEGATIVE 05/29/2019 0810   NITRITE NEGATIVE 05/29/2019 0810   LEUKOCYTESUR NEGATIVE  05/29/2019 0810   LEUKOCYTESUR Negative 08/19/2014 0025      Radiographic Studies: Dg Chest Portable 1 View  Result Date: 10/15/2019 CLINICAL DATA:  Shortness of breath. Chest pain. EXAM: PORTABLE CHEST 1 VIEW COMPARISON:  One-view chest x-ray 09/22/2019 FINDINGS: Heart is enlarged. Pacing and defibrillator wires are stable diffuse interstitial pattern is again seen. No significant airspace consolidation present. There are no definite effusions. IMPRESSION: 1. Stable cardiomegaly. 2. Prominent interstitial pattern compatible with interstitial edema and congestive heart failure. Electronically Signed   By: San Morelle M.D.   On: 10/15/2019 14:38    EKG: Independently reviewed.  Paced rhythm   Assessment/Plan:   Active Problems:   Acute on chronic systolic CHF (congestive heart failure) (HCC)    Body mass index is 27.81 kg/m.   Acute exacerbation of chronic systolic CHF: Admit to telemetry.  Treat with IV Lasix.  Monitor daily weight, BMP and urine output.  EF by left heart cath was 20 to 25%.  Chest pain withmildly elevated troponins in a patient with CAD s/p CABG: Trend troponins.  Continue aspirin, Plavix and statins.  Patient has been seen by the cardiologist.  Hypertensive emergency: IV labetalol as needed.  Continue home antihypertensives.  COPD and moderate to severe pulmonary hypertension: Continue bronchodilators.  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       Other information:   DVT prophylaxis: Lovenox Code Status: Full code. Family Communication: Plan discussed with the patient Disposition Plan: Possible discharge to home in 2 days Consults called: Cardiologist Admission status: Inpatient   The medical decision making is of moderate complexity, therefore this is a level 2 visit.  Time spent 50 minutes  [facility]   How to contact the [facility] Attending or Consulting provider [place] or covering provider during after hours [place], for this patient?   1. Check the care team in [facility] and look for a) attending/consulting [facility] provider listed and b) the [facility] team listed 2. Log into [email or web address] and use [place]'s universal password to access. If you do not have the password, please contact the hospital operator. 3. Locate the [facility] provider you are looking for under [facility] and page to a number that you can be directly reached. 4. If you still have difficulty reaching the provider, please page the [facility] (Director on Call) for the Hospitalists listed on amion for assistance.  10/15/2019, 4:21 PM

## 2019-10-16 ENCOUNTER — Other Ambulatory Visit: Payer: Self-pay | Admitting: *Deleted

## 2019-10-16 DIAGNOSIS — Z515 Encounter for palliative care: Secondary | ICD-10-CM

## 2019-10-16 DIAGNOSIS — Z7189 Other specified counseling: Secondary | ICD-10-CM

## 2019-10-16 LAB — BASIC METABOLIC PANEL
Anion gap: 11 (ref 5–15)
BUN: 27 mg/dL — ABNORMAL HIGH (ref 8–23)
CO2: 24 mmol/L (ref 22–32)
Calcium: 9.2 mg/dL (ref 8.9–10.3)
Chloride: 96 mmol/L — ABNORMAL LOW (ref 98–111)
Creatinine, Ser: 1.35 mg/dL — ABNORMAL HIGH (ref 0.44–1.00)
GFR calc Af Amer: 47 mL/min — ABNORMAL LOW (ref >60)
GFR calc non Af Amer: 41 mL/min — ABNORMAL LOW (ref >60)
Glucose, Bld: 297 mg/dL — ABNORMAL HIGH (ref 70–99)
Potassium: 3.9 mmol/L (ref 3.5–5.1)
Sodium: 131 mmol/L — ABNORMAL LOW (ref 135–145)

## 2019-10-16 LAB — SARS CORONAVIRUS 2 (TAT 6-24 HRS): SARS Coronavirus 2: NEGATIVE

## 2019-10-16 LAB — TROPONIN I (HIGH SENSITIVITY): Troponin I (High Sensitivity): 21 ng/L — ABNORMAL HIGH (ref <18)

## 2019-10-16 NOTE — Discharge Summary (Signed)
Physician Discharge Summary  Ashley [name] VPX:[id number] DOB: 1951-07-31 DOA: 10/15/2019  PCP: [name], MD  Admit date: 10/15/2019 Discharge date: 10/16/2019  Discharge disposition: Home   Recommendations for Outpatient Follow-Up:   Follow-up with PCP and cardiologist in 1 week   Discharge Diagnosis:   Active Problems:   Acute on chronic systolic CHF (congestive heart failure) ([place])    Discharge Condition: Stable.  Diet recommendation: Low-salt diet  Code status: Full code    Hospital Course:    Ashley [name] is an 68 y.o. female with medical history significant for COPD, pulmonary hypertension, chronic systolic CHF with EF of 40 to 45%, significant CAD, status post ICD placement, hypertension, recurrent admissions to the hospital for heart problems.  She presented to the hospital today with shortness of breath and chest pain.  Her troponin was only slightly elevated in the ED.  She was seen by the cardiologist, Dr Saralyn Pilar, who said her clinical features were not consistent with acute coronary syndrome.  She was admitted for exacerbation of chronic systolic CHF and she was treated with IV Lasix.  She also had severe hypertension on admission.  Her symptoms have resolved completely and she has been cleared by cardiologist for discharge.  Today, patient says she has been able to ambulate without any problems.  She said she felt a little weak but she will be okay when she gets home.  PT and OT were ordered to assess her debility but she said she did not need it and she will be fine.  Patient was admitted as an inpatient because she was expected to stay in the hospital for at least 2 midnights.  However, her condition improved rather quickly and there was no need to prolong her hospital stay.   Medical Consultants:    Cardiology   Discharge Exam:   Vitals:   10/15/19 2100 10/16/19 0429  BP: (!) 148/94 129/73  Pulse: 86 82  Resp: 19 18  Temp:  97.8 F  (36.6 C)  SpO2: 96% 98%   Vitals:   10/15/19 1900 10/15/19 2000 10/15/19 2100 10/16/19 0429  BP: (!) 158/98 (!) 150/82 (!) 148/94 129/73  Pulse: 91 92 86 82  Resp: (!) 24 (!) 23 19 18   Temp:    97.8 F (36.6 C)  TempSrc:    Oral  SpO2: 95% 96% 96% 98%  Weight:      Height:         GEN: NAD SKIN: No rash EYES: no pallor or icterus ENT: MMM CV: RRR PULM: CTA B ABD: soft, ND, NT, +BS CNS: AAO x 3, non focal EXT: No edema or tenderness   The results of significant diagnostics from this hospitalization (including imaging, microbiology, ancillary and laboratory) are listed below for reference.     Procedures and Diagnostic Studies:   DG Chest Portable 1 View  Result Date: 10/15/2019 CLINICAL DATA:  Shortness of breath. Chest pain. EXAM: PORTABLE CHEST 1 VIEW COMPARISON:  One-view chest x-ray 09/22/2019 FINDINGS: Heart is enlarged. Pacing and defibrillator wires are stable diffuse interstitial pattern is again seen. No significant airspace consolidation present. There are no definite effusions. IMPRESSION: 1. Stable cardiomegaly. 2. Prominent interstitial pattern compatible with interstitial edema and congestive heart failure. Electronically Signed   By: San Morelle M.D.   On: 10/15/2019 14:38     Labs:   Basic Metabolic Panel: Recent Labs  Lab 10/15/19 1417 10/16/19 0630  NA 136 131*  K 3.9 3.9  CL  98 96*  CO2 21* 24  GLUCOSE 178* 297*  BUN 16 27*  CREATININE 1.18* 1.35*  CALCIUM 9.0 9.2   GFR Estimated Creatinine Clearance: 40.3 mL/min (A) (by C-G formula based on SCr of 1.35 mg/dL (H)). Liver Function Tests: Recent Labs  Lab 10/15/19 1417  AST 26  ALT 16  ALKPHOS 80  BILITOT 1.5*  PROT 7.5  ALBUMIN 4.5   No results for input(s): LIPASE, AMYLASE in the last 168 hours. No results for input(s): AMMONIA in the last 168 hours. Coagulation profile No results for input(s): INR, PROTIME in the last 168 hours.  CBC: Recent Labs  Lab 10/15/19  1417  WBC 13.5*  NEUTROABS 10.6*  HGB 14.9  HCT 41.9  MCV 89.1  PLT 254   Cardiac Enzymes: No results for input(s): CKTOTAL, CKMB, CKMBINDEX, TROPONINI in the last 168 hours. BNP: Invalid input(s): POCBNP CBG: No results for input(s): GLUCAP in the last 168 hours. D-Dimer No results for input(s): DDIMER in the last 72 hours. Hgb A1c No results for input(s): HGBA1C in the last 72 hours. Lipid Profile No results for input(s): CHOL, HDL, LDLCALC, TRIG, CHOLHDL, LDLDIRECT in the last 72 hours. Thyroid function studies No results for input(s): TSH, T4TOTAL, T3FREE, THYROIDAB in the last 72 hours.  Invalid input(s): FREET3 Anemia work up No results for input(s): VITAMINB12, FOLATE, FERRITIN, TIBC, IRON, RETICCTPCT in the last 72 hours. Microbiology Recent Results (from the past 240 hour(s))  SARS CORONAVIRUS 2 (TAT 6-24 HRS) Nasopharyngeal Nasopharyngeal Swab     Status: None   Collection Time: 10/15/19  3:53 PM   Specimen: Nasopharyngeal Swab  Result Value Ref Range Status   SARS Coronavirus 2 NEGATIVE NEGATIVE Final    Comment: (NOTE) SARS-CoV-2 target nucleic acids are NOT DETECTED. The SARS-CoV-2 RNA is generally detectable in upper and lower respiratory specimens during the acute phase of infection. Negative results do not preclude SARS-CoV-2 infection, do not rule out co-infections with other pathogens, and should not be used as the sole basis for treatment or other patient management decisions. Negative results must be combined with clinical observations, patient history, and epidemiological information. The expected result is Negative. Fact Sheet for Patients: SugarRoll.be Fact Sheet for Healthcare Providers: https://www.woods-mathews.com/ This test is not yet approved or cleared by the [place] FDA and  has been authorized for detection and/or diagnosis of SARS-CoV-2 by FDA under an Emergency Use Authorization (EUA).  This EUA will remain  in effect (meaning this test can be used) for the duration of the COVID-19 declaration under Section 56 4(b)(1) of the Act, 21 U.S.C. section 360bbb-3(b)(1), unless the authorization is terminated or revoked sooner. Performed at [facility], [place] [address], [place], [place] [address]      Discharge Instructions:   Discharge Instructions    Diet - low sodium heart healthy   Complete by: As directed    Increase activity slowly   Complete by: As directed      Allergies as of 10/16/2019      Reactions   Vancomycin Nausea And Vomiting, Palpitations   Hydrocodone-acetaminophen Nausea Only      Medication List    TAKE these medications   albuterol 108 (90 Base) MCG/ACT inhaler Commonly known as: Ventolin HFA INHALE 2 PUFFS BY MOUTH INTO THE LUNGS EVERY 6 HOURS AS NEEDED FOR WHEEZING   ALPRAZolam 0.5 MG tablet Commonly known as: XANAX Take 0.5-1 tablets (0.25-0.5 mg total) by mouth at bedtime. TAKE ONE TABLET BY MOUTH AT BEDTIME  AS NEEDED FOR ANXIETY What changed: additional instructions   aspirin 81 MG tablet Take 81 mg by mouth daily.   atorvastatin 40 MG tablet Commonly known as: LIPITOR TAKE ONE TABLET BY MOUTH ONCE DAILY What changed: when to take this   carvedilol 6.25 MG tablet Commonly known as: COREG Take 1 tablet (6.25 mg total) by mouth 2 (two) times daily with a meal.   clopidogrel 75 MG tablet Commonly known as: PLAVIX Take 1 tablet (75 mg total) by mouth daily.   cyanocobalamin 1000 MCG/ML injection Commonly known as: (VITAMIN B-12) Inject 1 mL into the muscle monthly   Diphenhist 25 mg capsule Generic drug: diphenhydrAMINE Take 25 mg by mouth at bedtime as needed for allergies or sleep.   Entresto 24-26 MG Generic drug: sacubitril-valsartan Take 1 tablet by mouth 2 (two) times daily.   escitalopram 10 MG tablet Commonly known as: LEXAPRO Take 1 tablet (10 mg total) by mouth daily.   ferrous sulfate 325 (65 FE)  MG EC tablet Take 325 mg by mouth daily with breakfast.   furosemide 40 MG tablet Commonly known as: LASIX Take 1 tablet (40 mg total) by mouth 2 (two) times daily.   ipratropium-albuterol 0.5-2.5 (3) MG/3ML Soln Commonly known as: DUONEB Take 3 mLs by nebulization every 6 (six) hours as needed.   nitroGLYCERIN 0.4 MG SL tablet Commonly known as: NITROSTAT Place 1 tablet (0.4 mg total) under the tongue every 5 (five) minutes as needed for chest pain.   omeprazole 40 MG capsule Commonly known as: PRILOSEC Take 1 capsule (40 mg total) by mouth daily.   potassium chloride 10 MEQ tablet Commonly known as: KLOR-CON Take 1 tablet (10 mEq total) by mouth daily.   spironolactone 25 MG tablet Commonly known as: Aldactone Take 0.5 tablets (12.5 mg total) by mouth daily.   traZODone 50 MG tablet Commonly known as: DESYREL Take 0.5-1 tablets (25-50 mg total) by mouth daily. One hour before bedtime   Trelegy Ellipta 100-62.5-25 MCG/INH Aepb Generic drug: Fluticasone-Umeclidin-Vilant Inhale 1 puff into the lungs daily.      Follow-up Information    Paraschos, Alexander, MD Follow up in 1 week(s).   Specialty: Cardiology Contact information: [facility] [place] [address] [phone]            Time coordinating discharge: 28 minutes  Signed:  Jennye [name]  [facility] 10/16/2019, 11:24 AM

## 2019-10-16 NOTE — TOC Initial Note (Signed)
Transition of Care [facility]) - Initial/Assessment Note    Patient Details  Name: Ashley [name] MRN: [id number] Date of Birth: 1950/11/11  Transition of Care [facility]) CM/SW Contact:    Trecia Rogers, LCSW Phone Number: 10/16/2019, 11:03 AM  Clinical Narrative:                  Patient is from home. Patient arrived at the ED due to acute respiratory failure with hypoxia. Patient lives at home with her fiance and her fiance's brother. Patient reports that her fiance and his brother help her quite a bit. Patient stated that she does have a neubilizer that she uses at home. Patient asked if she could obtain a shower stool/bench for her home. CSW contacted Brad from [employer]. Leroy Sea stated that insurance usually does not cover shower stool/benches, but he will look into it. Patient denied wanting home health in the home.  Patient's PCP is: Deborra Medina, MD  Patient's pharmacy is: [facility]. Patient stated that she does not have any concerns or issues obtaining her medications.    Expected Discharge Plan: Home/Self Care Barriers to Discharge: Continued Medical Work up   Patient Goals and CMS Choice Patient states their goals for this hospitalization and ongoing recovery are:: "I want to discharge today" CMS Medicare.gov Compare Post Acute Care list provided to:: Patient Choice offered to / list presented to : Patient  Expected Discharge Plan and Services Expected Discharge Plan: Home/Self Care In-house Referral: Clinical Social Work   Post Acute Care Choice: Durable Medical Equipment Living arrangements for the past 2 months: Single Family Home                 DME Arranged: Shower stool DME Agency: [employer] Date DME Agency Contacted: 10/16/19 Time DME Agency Contacted: [phone] Representative spoke with at DME Agency: [place] Arrangements/Services Living arrangements for the past 2 months: [place] with:: Significant Other, Other  (Comment)(fiance's brother) Patient language and need for interpreter reviewed:: Yes Do you feel safe going back to the place where you live?: Yes      Need for Family Participation in Patient Care: Yes (Comment)(pt's fiance and fiance's brother) Care giver support system in place?: Yes (comment)(pt's fiance and fiance's brother) Current home services: DME(Neubilizer) Criminal Activity/Legal Involvement Pertinent to Current Situation/Hospitalization: No - Comment as needed  Activities of Daily Living      Permission Sought/Granted Permission sought to share information with : Case Manager, Family Supports                Emotional Assessment Appearance:: Appears stated age Attitude/Demeanor/Rapport: Gracious, Charismatic, Engaged Affect (typically observed): Accepting, Adaptable, Appropriate Orientation: : Oriented to Self, Oriented to Place, Oriented to  Time, Oriented to Situation Alcohol / Substance Use: Not Applicable Psych Involvement: No (comment)  Admission diagnosis:  breathing difficulty Patient Active Problem List   Diagnosis Date Noted  . Mitral stenosis with insufficiency   . Hypertensive urgency   . Chronic renal failure, stage 3a   . Depression   . Respiratory failure with hypoxia ([place]) 09/22/2019  . Hyperglycemia 09/22/2019  . Presence of permanent cardiac pacemaker   . Anemia   . Chronic kidney disease   . Hypertension   . Mitral valve disorder 08/25/2019  . Acute respiratory failure ([place]) 08/24/2019  . Acute CHF ([employer]) 08/12/2019  . CHF (congestive heart failure) ([place]) 07/16/2019  . Cough with hemoptysis 06/09/2019  .  Insomnia 05/07/2019  . Hypokalemia 03/23/2019  . Hyperglycemia, drug-induced 03/23/2019  . COVID-19 virus not detected 03/23/2019  . Respiratory failure ([place]) 03/16/2019  . AVM (arteriovenous malformation) of small bowel, acquired   . Anemia, iron deficiency 11/10/2018  . Acute on chronic systolic CHF (congestive heart failure) ([place])  11/06/2018  . Rectal polyp   . Benign neoplasm of cecum   . Barrett's esophagus without dysplasia   . Stomach irritation   . Chronic diastolic heart failure ([place]) 07/03/2018  . HTN (hypertension) 07/03/2018  . COPD with emphysema ([place]) 06/28/2018  . B12 deficiency anemia 06/28/2018  . Hypotension 05/11/2018  . Prediabetes 05/11/2018  . Personal history of colon cancer   . Benign neoplasm of descending colon   . Polyp of sigmoid colon   . Benign neoplasm of transverse colon   . Diverticulosis of large intestine without diverticulitis   . 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 Renal artery stenosis ([place]) 10/03/2016  . 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 Hospital discharge follow-up 02/09/2015  . Cough in adult 10/21/2014  . Tobacco abuse counseling 10/11/2014  . Tobacco abuse 10/11/2014  . Chronic right hip pain 08/26/2014  . Atherosclerosis of native artery of extremity with intermittent claudication ([place]) 06/18/2013  . Preoperative evaluation to rule out surgical contraindication 06/18/2013  . CAD (coronary artery disease) 06/01/2013  . GERD (gastroesophageal reflux disease) 06/01/2013  . Hypercholesterolemia 06/01/2013  . Tubular adenoma of colon 06/01/2013  . Major depressive disorder in remission ([place]) 06/01/2013   PCP:  [name], MD Pharmacy:   [facility], [place], [place] [address] [place] [address] Phone: [phone] Fax: [place], [place] [address] [place] [address] Phone: [phone] Fax: [phone]     Social Determinants of Health (SDOH) Interventions    Readmission Risk Interventions Readmission Risk Prevention Plan 10/16/2019 09/23/2019 08/26/2019  Transportation Screening Complete Complete Complete  PCP or Specialist Appt within 3-5 Days - - -  HRI or [place] - - -  Palliative Care  Screening - - -  Medication Review (RN Care Manager) Complete - Complete  PCP or Specialist appointment within 3-5 days of discharge - Complete Complete  HRI or Home Care Consult Patient refused - Patient refused  SW Recovery Care/Counseling Consult Complete - Complete  Palliative Care Screening Not Applicable Not Applicable Not [place] Not Applicable Not Applicable Not Applicable  Some recent data might be hidden

## 2019-10-16 NOTE — Patient Outreach (Signed)
[facility]) Care Management [place] Telephone Outreach Care Coordination  10/16/2019  Ashley [name] Aug 12, 1951 [id number]  [place] Telephone Outreach Care Coordination  Successful incoming telephone outreach from [place] RN Dr. Saralyn Pilar [phone]) re:  Ashley [name], 68 y/o femalereferred followed by [facility] CM for multiple recent hospitalizations due to CHF exacerbation  Lovey Newcomer called today to share with me that she heard back from [facility] Cardiac MRI team, who reported to her that they are not able to complete cardiac MRI due to presence of patient's existing ICD/ pacemaker; Lovey Newcomer shared that patient's only option is to await for scheduled cardiac MRI at [facility], currently scheduled for 11/03/2019.  Plan:  -- will continue to collaborate as indicated with patient's care team  [name], RN, BSN, [place] Care Management  [phone]

## 2019-10-16 NOTE — Consult Note (Signed)
Consultation Note Date: 10/16/2019   Patient Name: Ashley [name]  DOB: January 23, 1951  MRN: [id number]  Age / Sex: 68 y.o., female  PCP: [name], MD Referring Physician: Jennye Boroughs, MD  Reason for Consultation: Establishing goals of care  HPI/Patient Profile: 68 y.o. female  with past medical history of COPD, pulmonary hypertension, systolic CHF 50-03%, severe CAD, s/p AICD, HTN, recurrent admissions 6 in 6 mo admitted on 10/15/2019 with acute CHF exacerbation with significant improvement after IV Lasix.   Clinical Assessment and Goals of Care: I met today with Debbie. She is sitting up and smiling. She tells me that she feels much better and is happy to say that she is going home soon. She is happy that she does not have to be admitted today.   We discussed her overall health condition and readmissions. She tells me that her QOL has been impacted but she has been able to adjust and can still do mostly anything she wants it just takes her longer and may make her SOB. She is awaiting cardiac MRI scheduled at [facility] for 12/28 and follows with [facility] with heart failure and with Dr. Saralyn Pilar. She is hopeful to be presented with some options that can help to keep her out of the hospital.   At this time she understands that her health conditions are severe and are expected to get worse over time. She understands but is hopeful for more time and wants to live as long as possible. She would like to continue with aggressive care and full code. She tells me that her fiance would be her surrogate decision maker and that they have reviewed Living Will (not completed) but have had these discussions. She says that he knows her wishes and what she wants and that she wants to be cremated. At this time she is open to all available interventions to keep her alive.   All questions/concerns addressed. Emotional support  provided.   Primary Decision Maker PATIENT  Code Status/Advance Care Planning:  Full code   Symptom Management:   Per cardiology/heart failure   Psycho-social/Spiritual:   Desire for further Chaplaincy support:no  Additional Recommendations: Caregiving  Support/Resources and Referral to Community Resources   Prognosis:   Overall prognosis very poor with severe CAD and advanced CHF. If options available to address CAD could consider evaluation for advance therapies for CHF (LVAD???).   Discharge Planning: Home with Home Health      Primary Diagnoses: Present on Admission: . Acute on chronic systolic CHF (congestive heart failure) ([place])   I have reviewed the medical record, interviewed the patient and family, and examined the patient. The following aspects are pertinent.  Past Medical History:  Diagnosis Date  . AICD (automatic cardioverter/defibrillator) present    on right side  . Anemia   . CAD (coronary artery disease)    s/p CABG  . CHF (congestive heart failure) ([place])   . Chronic kidney disease    renal artery stenosis  . Colon cancer ([place])   .  Depression   . GERD (gastroesophageal reflux disease)   . Hx of colonic polyps   . Hyperlipidemia   . Hypertension   . Mitral valve disorder    s/p mitral valve repair wth CABG  . Myocardial infarction ([place])   . Peripheral vascular disease ([place])   . Presence of permanent cardiac pacemaker    Pacemaker/ Defibrillator  . Tobacco abuse 10/11/2014   Social History   Socioeconomic History  . Marital status: Single    Spouse name: Not on file  . Number of children: 0  . Years of education: 46  . Highest education level: 11th grade  Occupational History  . Occupation: disabled  Tobacco Use  . Smoking status: Former Smoker    Packs/day: 0.25    Types: Cigarettes    Quit date: 06/19/2019    Years since quitting: 0.3  . Smokeless tobacco: Never Used  . Tobacco comment: reports smoking 2-3 cigarettes/ day   Substance and Sexual Activity  . Alcohol use: Yes    Alcohol/week: 2.0 standard drinks    Types: 2 Shots of liquor per week    Comment: occasion  . Drug use: No  . Sexual activity: Yes    Comment: 1 partner  Other Topics Concern  . Not on file  Social History Narrative   07/01/2019: Patient reports she has stopped smoking completely since she was hospitalized on June 19, 2019   Social Determinants of Health   Financial Resource Strain: Low Risk   . Difficulty of Paying Living Expenses: Not hard at all  Food Insecurity: No Food Insecurity  . Worried About Charity fundraiser in the Last Year: Never true  . Ran Out of Food in the Last Year: Never true  Transportation Needs: No Transportation Needs  . Lack of Transportation (Medical): No  . Lack of Transportation (Non-Medical): No  Physical Activity: Inactive  . Days of Exercise per Week: 0 days  . Minutes of Exercise per Session: 0 min  Stress: No Stress Concern Present  . Feeling of Stress : Only a little  Social Connections: Moderately Isolated  . Frequency of Communication with Friends and Family: Once a week  . Frequency of Social Gatherings with Friends and Family: Once a week  . Attends Religious Services: Never  . Active Member of Clubs or Organizations: No  . Attends Archivist Meetings: Never  . Marital Status: Living with partner   Family History  Problem Relation Age of Onset  . Arthritis Mother   . Cancer Mother        uterus cancer  . Hyperlipidemia Mother   . Hypertension Mother   . Heart disease Mother   . Diabetes Mother   . Hyperlipidemia Father   . Hypertension Father   . Heart disease Father   . Diabetes Father   . Cancer Sister        ovary cancer  . Diabetes Maternal Grandmother   . Hypertension Maternal Grandmother   . Arthritis Maternal Grandmother   . Hypertension Maternal Grandfather   . Hypertension Paternal Grandmother   . Hypertension Paternal Grandfather   . Heart disease  Paternal Grandfather   . Heart disease Brother   . Kidney disease Brother   . Breast cancer Neg Hx    Scheduled Meds: . ALPRAZolam  0.25-0.5 mg Oral QHS  . aspirin EC  81 mg Oral Daily  . atorvastatin  40 mg Oral q1800  . carvedilol  6.25 mg Oral BID WC  .  clopidogrel  75 mg Oral Daily  . enoxaparin (LOVENOX) injection  40 mg Subcutaneous Q24H  . escitalopram  10 mg Oral Daily  . ferrous sulfate  325 mg Oral Q breakfast  . Fluticasone-Umeclidin-Vilant  1 puff Inhalation Daily  . furosemide  40 mg Intravenous BID  . pantoprazole  40 mg Oral Daily  . potassium chloride  10 mEq Oral Daily  . sacubitril-valsartan  1 tablet Oral BID  . sodium chloride flush  3 mL Intravenous Q12H  . spironolactone  12.5 mg Oral Daily  . traZODone  25-50 mg Oral Daily   Continuous Infusions: . sodium chloride     PRN Meds:.sodium chloride, acetaminophen, diphenhydrAMINE, ipratropium-albuterol, labetalol, nitroGLYCERIN, ondansetron (ZOFRAN) IV, sodium chloride flush Allergies  Allergen Reactions  . Vancomycin Nausea And Vomiting and Palpitations  . Hydrocodone-Acetaminophen Nausea Only   Review of Systems  Constitutional: Positive for activity change and fatigue.  Respiratory: Positive for shortness of breath.        Much improved but chronic SOB with activity    Physical Exam Vitals and nursing note reviewed.  Constitutional:      General: She is not in acute distress.    Appearance: She is ill-appearing.  Cardiovascular:     Rate and Rhythm: Normal rate.  Pulmonary:     Effort: Pulmonary effort is normal. No tachypnea, accessory muscle usage or respiratory distress.  Abdominal:     General: Abdomen is flat.     Palpations: Abdomen is soft.  Neurological:     Mental Status: She is alert and oriented to person, place, and time.     Vital Signs: BP 129/73 (BP Location: Left Arm)   Pulse 82   Temp 97.8 F (36.6 C) (Oral)   Resp 18   Ht '5\' 4"'$  (1.626 m)   Wt 73.5 kg   SpO2 98%    BMI 27.81 kg/m  Pain Scale: 0-10   Pain Score: 2    SpO2: SpO2: 98 % O2 Device:SpO2: 98 % O2 Flow Rate: .   IO: Intake/output summary: No intake or output data in the 24 hours ending 10/16/19 0922  LBM:   Baseline Weight: Weight: 73.5 kg Most recent weight: Weight: 73.5 kg     Palliative Assessment/Data:    Time In: 1000 Time Out: 1100 Time Total: 60 min Greater than 50%  of this time was spent counseling and coordinating care related to the above assessment and plan.  Signed by: Vinie Sill, NP Palliative Medicine Team Pager # [phone] (M-F 8a-5p) Team Phone # [phone] (Nights/Weekends)

## 2019-10-16 NOTE — Progress Notes (Signed)
[facility] Cardiology  SUBJECTIVE: Patient laying in bed, reports feeling much better, denies chest pain, with improved breathing   Vitals:   10/15/19 1900 10/15/19 2000 10/15/19 2100 10/16/19 0429  BP: (!) 158/98 (!) 150/82 (!) 148/94 129/73  Pulse: 91 92 86 82  Resp: (!) 24 (!) 23 19 18   Temp:    97.8 F (36.6 C)  TempSrc:    Oral  SpO2: 95% 96% 96% 98%  Weight:      Height:        No intake or output data in the 24 hours ending 10/16/19 0733    PHYSICAL EXAM  General: Well developed, well nourished, in no acute distress HEENT:  Normocephalic and atramatic Neck:  No JVD.  Lungs: Clear bilaterally to auscultation and percussion. Heart: HRRR . Normal S1 and S2 without gallops or murmurs.  Abdomen: Bowel sounds are positive, abdomen soft and non-tender  Msk:  Back normal, normal gait. Normal strength and tone for age. Extremities: No clubbing, cyanosis or edema.   Neuro: Alert and oriented X 3. Psych:  Good affect, responds appropriately   LABS: Basic Metabolic Panel: Recent Labs    10/15/19 1417 10/16/19 0630  NA 136 131*  K 3.9 3.9  CL 98 96*  CO2 21* 24  GLUCOSE 178* 297*  BUN 16 27*  CREATININE 1.18* 1.35*  CALCIUM 9.0 9.2   Liver Function Tests: Recent Labs    10/15/19 1417  AST 26  ALT 16  ALKPHOS 80  BILITOT 1.5*  PROT 7.5  ALBUMIN 4.5   No results for input(s): LIPASE, AMYLASE in the last 72 hours. CBC: Recent Labs    10/15/19 1417  WBC 13.5*  NEUTROABS 10.6*  HGB 14.9  HCT 41.9  MCV 89.1  PLT 254   Cardiac Enzymes: No results for input(s): CKTOTAL, CKMB, CKMBINDEX, TROPONINI in the last 72 hours. BNP: Invalid input(s): POCBNP D-Dimer: No results for input(s): DDIMER in the last 72 hours. Hemoglobin A1C: No results for input(s): HGBA1C in the last 72 hours. Fasting Lipid Panel: No results for input(s): CHOL, HDL, LDLCALC, TRIG, CHOLHDL, LDLDIRECT in the last 72 hours. Thyroid Function Tests: No results for input(s): TSH, T4TOTAL,  T3FREE, THYROIDAB in the last 72 hours.  Invalid input(s): FREET3 Anemia Panel: No results for input(s): VITAMINB12, FOLATE, FERRITIN, TIBC, IRON, RETICCTPCT in the last 72 hours.  DG Chest Portable 1 View  Result Date: 10/15/2019 CLINICAL DATA:  Shortness of breath. Chest pain. EXAM: PORTABLE CHEST 1 VIEW COMPARISON:  One-view chest x-ray 09/22/2019 FINDINGS: Heart is enlarged. Pacing and defibrillator wires are stable diffuse interstitial pattern is again seen. No significant airspace consolidation present. There are no definite effusions. IMPRESSION: 1. Stable cardiomegaly. 2. Prominent interstitial pattern compatible with interstitial edema and congestive heart failure. Electronically Signed   By: San Morelle M.D.   On: 10/15/2019 14:38     Echo LVEF 40 to 45%, with moderate to severe mitral regurgitation, mild mitral stenosis, by 2D echocardiogram 05/27/2019  TELEMETRY: Sinus rhythm:  ASSESSMENT AND PLAN:  Active Problems:   Acute on chronic systolic CHF (congestive heart failure) ([place])    1.  Chest pain, atypical in nature, nondiagnostic ECG, borderline elevated troponin, resolved 2.  Coronary artery disease, status post CABG x3, recent cardiac catheterization 08/26/2019 revealed patent LIMA to LAD, occluded SVG to RCA and OM1 3.  Ischemic cardiomyopathy, LVEF 20 to 25% by left ventriculography, status post BiV ICD 01/2016 4.  Acute on chronic systolic congestive heart failure, patient currently  appears euvolemic 5.  COPD/moderate to severe pulmonary hypertension, patient complaining of shortness of breath on presentation, with decreased breath sounds, much improved after nebulizer therapy 6.  Post mitral valve repair, mixed mitral stenosis mitral regurgitation, of uncertain clinical significance, scheduled for cardiac MRI at [facility] 11/03/2019  Recommendations  1.  Agree with overall current therapy 2.  Defer repeat cardiac catheterization 3.  Discharge home today 4.   Follow-up as outpatient with me in 1 week 5.  Cardiac MRI at [facility] scheduled 11/03/2019   Isaias Cowman, MD, PhD, [facility] 10/16/2019 7:33 AM

## 2019-10-17 ENCOUNTER — Encounter: Payer: Self-pay | Admitting: *Deleted

## 2019-10-17 ENCOUNTER — Other Ambulatory Visit: Payer: Self-pay | Admitting: *Deleted

## 2019-10-17 NOTE — Patient Outreach (Signed)
[place] [facility]) Care Management [address] Telephone Outreach Care Coordination  10/17/2019  Ashley [name] 1951-02-23 [id number]  Successful telephone outreach for care coordination toDeborah [name], 68 y/o femalereferred to [place] by [facility] Liaison RN CM after hospitalization July 21-25, 2089for acute on chronic CHF exacerbation.Patientwas discharged from hospital to home/ self-care without home health services in place. Patient has history including, but not limited to, combined CHF; CAD with previous MI/ CABG, and ICD; HTN/ HLD; GERD; COPD;history oftobacco use- patient has quit smoking since June 19, 2019.HIPAA/ identity verified.  Unfortunately, patient experiencedmultiplerecenthospital readmissions: 1)August 13-15, 2066for acute respiratory failure with hypoxia/18 days after her hospital discharge, ; patient was again discharged home without home health services in place. 2) September 9-12, 2020 for acute pulmonary edema; respiratory failure with hypoxia; and CHF exacerbation/ 25 days after previous hospital discharge; was again discharged home without home health services in place, as patient continually refuses need for home health. 3) ED visit 08/02/2019 for chest pain; patient was also diagnosed with ETOH intoxication and discharged homeafter being ruled out for ACS/ MI 4)October 6-9,2020,24 days after her last hospital discharge, again for COPD/ CHF exacerbation. 5) October 18-21, 2020, 9 days after last hospital discharge; Acute Respiratory failure with flash pulmonary edema, secondary to CHF exacerbation/ severe MS/ MR: patient had cardiac catherization during this hospital admission and was determined to have significant 3-vessel CAD and EF of 20-25% 6) November 16-18, 2020(26 days afterlast hospital discharge), again with acute respiratory failure/ CHF exacerbation.  Patient reported acute episode SOB on 10/15/2019  and went to ED as advised; plan was for patient admission to hospital, however, patient was discharged home from ED without inpatient admission on 10/16/2019. Patient was discharged home to self-care without home health services in place.  Call placed today to follow up on ED visit 12/09-08/2019, and make patient aware that I received call from cardiology provider office staff yesterday informing that [facility] health Cardiac MRI team is not able to schedule cardiac MRI due to presence of her ICD and pacemaker; patient verbalized understanding of same and confirms today that there were no changes in medications/ plan of care post- ED visit 10/15/2019.  Patient reports that she is "doing fine today" but is tired and sore from lying on the ED gurney for "over 24 hours."  Patient sounds to be in no distress during phone call today.  Patient confirms that should she experience additional episodes SOB, she will follow established action plan and also confirms that she continues to plan to attend scheduled cardiac MRI at [facility] on 11/03/2019 and confirms that she has reliable transportation through family members.  Patient denies further issues, concerns, or problems today.  I confirmed that patient has my direct phone number, the main [facility] CM office phone number, and the [facility] CM 24-hour nurse advice phone number should issues arise prior to next scheduled [place] outreach.  Encouraged patient to contact me directly if needs, questions, issues, or concerns arise prior to next scheduled outreach; patient agreed to do so.  Plan:  [facility] CM outreach to continue as previously scheduled, unless indicated sooner  [name], RN, BSN, [employer] Care Management  [phone]

## 2019-10-17 NOTE — Patient Outreach (Signed)
[place] [facility]) Care Management  10/17/2019  Ashley [name] 23-Aug-1951 [id number]  [place] Telephone Outreach Care Coordination  Received incoming telephone outreach/ secure voice mail from [name] RN Dr. Saralyn Pilar [phone]) re:  Ashley [name], 68 y/o femalereferred followed by [facility] CM for multiple recent hospitalizations due to CHF exacerbation  Lovey Newcomer called today to share with me that she contacted [facility] regarding follow up to see if scheduled MRI could be made sooner than is already scheduled; she confirmed that the [place] team shared that they were aware that patient has ICD/ pacemaker and that they are able to complete the cardiac MRI; however, they are unable to schedule sooner than her currently scheduled appointment on Monday 11/03/2019.  Sandy did not request call back unless I had further questions   Plan:  -- will continue to collaborate as indicated with patient's care team  [name], RN, BSN, [place] Coordinator [facility] Care Management  [phone]

## 2019-10-23 ENCOUNTER — Other Ambulatory Visit: Payer: Self-pay | Admitting: *Deleted

## 2019-10-23 NOTE — Patient Outreach (Signed)
[place] [facility]) Care Management [facility] CM Multidisciplinary Case Conference 10/23/2019  Ashley [name] [birth year] [id number]  [facility] CM Multidisciplinary case conference [id number] in blue text  Ashley [name], 68 y/o femalereferred to [place] by [facility] Liaison RN CM after recent hospitalization July 21-25, 2014for acute on chronic CHF exacerbation.Patientwas discharged from hospital to home/ self-care without home health services in place. Patient has history including, but not limited to, combined CHF; CAD with previous MI/ CABG, and ICD; HTN/ HLD; GERD; COPD;history oftobacco use- patient has quit smoking since June 19, 2019.  Unfortunately, patient experiencedmultiplerecenthospital readmissions: 1)August 13-15, 2035for acute respiratory failure with hypoxia/18 days after her hospital discharge, ; patient was again discharged home without home health services in place. 2) September 9-12, 2020 for acute pulmonary edema; respiratory failure with hypoxia; and CHF exacerbation/ 25 days after previous hospital discharge; was again discharged home without home health services in place, as patient continually refuses need for home health. 3) ED visit 08/02/2019 for chest pain; patient was also diagnosed with ETOH intoxication and discharged homeafter being ruled out for ACS/ MI 4)October 6-9,2020,24 days after her last hospital discharge, again for COPD/ CHF exacerbation. 5) October 18-21, 2020, 9 days after last hospital discharge; Acute Respiratory failure with flash pulmonary edema, secondary to CHF exacerbation/ severe MS/ MR: patient had cardiac catherization during this hospital admission and was determined to have significant 3-vessel CAD and EF of 20-25% 6) November 16-18, 2020(26 days afterlast hospital discharge), again with acute respiratory failure/ CHF exacerbation. 7) December 9-10, 2020: (21 days post- last hospital discharge)- again  with CHF exacerbation; [facility] RN CM contacted patient on morning prior to ED visit- patient had acute episode SOB at home during early morning hours and followed CHF action plan; episode resolved at home and [facility] RN CM and patient contacted cardiology provider/ pulmonary providers; pulmonary provider confirmed that patient's primary issues revolve around her cardiac issues/ need for definitive intervention; recommended that patient be evaluated at hospital given her history/ frequent exacerbations.  Inpatient admission was planned, however, patient was kept in ED, stabilized and discharged home after overnight visit/ intervention.  [facility] CM Team Discussion/ plan:  10/23/19: Multiple care coordination outreaches placed at time of patient most recent exacerbation; confirmed that [place] cardiac MRI unable to complete MRI due to presence of patient's ICD/ pacemaker; confirmed that [facility] unable to scheduled sooner visit for MRI; still planned for 11/03/2019.  Confirmed with patient that she plans to attend as scheduled and has 2  plan options for transportation to [facility] through family members.  Peer to peer follow up completed; cardiology continues to maintain that cardiac MRI necessary before definitive intervention can be planned.  -- Will continue closely following patient and care coordination efforts as indicated -- will re-present to case discussion as indicated  10/10/2019:  Patient frustration that no definitive intervention is completed while she is inpatient/ hospitalized; significant delay in scheduling recommended cardiac MRI at [facility]; [facility] has facilitated getting cardiac MRI scheduled by coordinating with cardiology provider team, however, it is not scheduled until the end of December; looking into option of having test completed at [facility].  Patient fearful that she will re-admit prior to having the MRI done on 11/03/2019 at [facility]  -- Will continue care coordination efforts to explore option of  having cardiac MRI scheduled at [facility] rather than [facility] -- Will submit case for MD peer-peer intervention/ follow up  From 10/16 conference: -- patient to attend cardiology office  visit this afternoon, 08/22/2019 for follow up and discussion/ development of plan for possible need for MVR revision/ replacement -- patient maintains that she has continued NOT smoking and only consumes alcohol socially; continues to endorse that her family members that do smoke are nolonger smoking inside the home -- patient frustrated around cardiology notes from most recent hospitalization documenting that she is still smoking- patient adamant that she has been quit since June 19, 2019  -- patient frustrated that when she is at hospital there has not been discussion around transferring her to [facility] as inpatient (as per discussion patient had with cardiology during last hospital visit) and fixing the MV, rather than discharging her home to follow up as outpatient; after her in-patient ECHO done in July patient felt that the potential issue around the need for MVR/ revision was not discussed with her as a priority need.  Plan:[facility] CM team to continue to follow patient closely and follow up on cardiology office visit today and PCP office visit on Monday 08/25/2019  From 07/04/2019 case conference:  -- continue efforts to encourage patient to remain smoke free: collaborate with providers/ new pulmonary referral (pending): patient attended pulmonary referral provider office visit on 07/24/2019; pulmonary provider agreed that patient's recent issues with respiratory status may likely be related more to need for revision of MVR; PFT's pending- appointment -- engage patient's fiancee/ family in efforts to partner with patient and also quit smoking-- follow up patient/ family on efforts -- explore nutritional habits/ practices more thoroughly with ongoing outreaches -- engage patient's PCP in efforts to discern reason for  multiple recent hospitalizations -- encourage/ facilitate as indicated patient to promptly schedule hospital discharge cardiology appointment for evaluation of need for MVR revision --continue to follow closely;resubmit to [facility] case discussionin 3 weeks for update, unless indicated sooner  Barriers:  From 10/23/19 conference:  -- frequent exacerbations in patient needing definitive cardiac intervention, with significant delays in recommended cardiac MRI (currently still scheduled 11/03/19); concern that patient will exacerbate and therefore miss her scheduled appointment; have confirmed with patient on multiple occasions that she has reliable transportation, planned options for unexpected issues that could occur with transportation, and definite plans to attend cardiac MRI as scheduled at [place]  From 10/10/19 conference:  -- lack of trust in health care team related to no definitive intervention taken to address need for cardiac surgery while she is hospitalized; frustrated that she is discharged home with each of her multiple admissions, despite being told at hospital she needs cardiac surgery.  From 8/28/2020and 9/25/2020case conference:  -- Extensive clinical history: cardiac and respiratory; overall fragile with occasional- intermittent episodes of extreme overnight weight gain > 3 lbs -- Ongoing smoking-- patient reports has recently quit over last 2 weeks after last hospital discharge:patient endorses that she quit smoking on June 19, 2019 at time of her hospital admission -- Need for [place] involved/ addressing: medications now optimized; [name] continues following for ongoing management of patient medication needs -- questionable issues around patent's perception of health status; may not be based in reality; patient not proactive in addressing acute issues; has not followed up on requests for follow up needed by her to address her  medication concerns; ongoing uncertainty around patient's perception/ motivation of self- management/ control her clinical condition -- good family support overall; family members have now taken their own smoking outside to help patient remain smoke free -- patient reported stress around her fiancee's health/ recent hospitalizations, although  she is noted to be on multiple medications for stress/ anxiety control -- updated ECHO performed during most recent hospital admission, with EF noted 45% (decreased from 55-60% in 2017)- inpatient medical team suspects possibility that 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

## 2019-10-29 ENCOUNTER — Encounter: Payer: Self-pay | Admitting: *Deleted

## 2019-10-29 ENCOUNTER — Other Ambulatory Visit: Payer: Self-pay | Admitting: *Deleted

## 2019-10-29 NOTE — Patient Outreach (Signed)
[place]) Care Management [place] Telephone Outreach PCP office completes Transition of Care follow up post-hospital discharge Post- (most recent) hospital discharge day # 13  10/29/2019  Ashley [name] 1951-07-20 [id number]  Successful telephone outreach toDeborah Loyal [name], 68 y/o femalereferred to [place] by [facility] Liaison RN CM after hospitalization July 21-25, 2058fr acute on chronic CHF exacerbation.Patientwas discharged from hospital to home/ self-care without home health services in place. Patient has history including, but not limited to, combined CHF; CAD with previous MI/ CABG, and ICD; HTN/ HLD; GERD; COPD;history oftobacco use- patient has quit smoking since June 19, 2019.  Unfortunately, patient experiencedmultiplerecenthospital readmissions: 1)August 13-15, 20214f acute respiratory failure with hypoxia/18 days after her hospital discharge, ; patient was again discharged home without home health services in place. 2) September 9-12, 2020 for acute pulmonary edema; respiratory failure with hypoxia; and CHF exacerbation/ 25 days after previous hospital discharge; was again discharged home without home health services in place, as patient continually refuses need for home health. 3) ED visit 08/02/2019 for chest pain; patient was also diagnosed with ETOH intoxication and discharged homeafter being ruled out for ACS/ MI 4)October 6-9,2020,24 days after her last hospital discharge, again for COPD/ CHF exacerbation. 5) October 18-21, 2020, 9 days after last hospital discharge; Acute Respiratory failure with flash pulmonary edema, secondary to CHF exacerbation/ severe MS/ MR: patient had cardiac catherization during this hospital admission and was determined to have significant 3-vessel CAD and EF of 20-25% 6) November 16-18, 2020(26 days afterlast hospital discharge), again with acute respiratory failure/ CHF  exacerbation. 7) December 9-10, 2020 (21 days post-last hospital discharge), again with acute respiratory failure/ CHF exacerbation.  HIPAA/ identity verified with patient; today, patient reports "doing great except my gout has flared up;" states that over the last few days her (L) foot has developed an acute flare of gout, which she has had several times before.  States that this usually resolves spontaneously without treatment; however, given patient's stated pain level of "10/10" I encouraged her to consider contacting her PCP to report/ advise; patient stated she will consider.  Using walker for ambulation due to gout flare; does not normally use assistive devices for ambulation.  Patient denies other clinical concerns and new/ recent falls and sounds to be in no apparent distress throughout phone call today.  Patient further reports:  -- continues to monitor and record daily weights and vital signs; reports since ER visit 10/15/2019 "weights and vital signs have been stable;" reports weight ranges between 159-164 lbs, with a weight today of "163 lbs" which is slightly increased from previously reported ranges; however, patient states this is her normal range and reports goal of < 165 lbs as guideline per care provider instruction; patient remains able to independently verbalize weight gain guidleines and corresponding action plan in setting of CHF  -- has and is taking all medications: denies medication concerns  -- continues to verbalize [facility]- reports has reliable transportation arranged through family members and has received map of cardiac MRI site; encouraged patient to plan ahead to leave early and arrive on time; states she plans to do.  Discussed possible difficulty in navigating walk to MRI site, if her gout is still flared, but patient assures me today that her gout "will get better" based on previous flares-- I again encouraged her to  contact PCP if she is concerned that the pain in her (L) foot  will prevent her from attending this appointment, however, she assures me that she "is going" as scheduled, adding that she has "been waiting a long time for this."   Patient denies further issues, concerns, or problems today. I confirmed that patient hasmy direct phone number, the main [facility] CM office phone number, and the [facility] CM 24-hour nurse advice phone number should issues arise prior to next scheduled [facility] CM outreachpost-upcoming scheduled cardiac MRI. Encouraged patient to contact me directly if needs, questions, issues, or concerns arise prior to next scheduled outreach; patient agreed to do so.  Plan:  Patient will take medications as prescribed and will attend all scheduled provider appointments  Patient will promptly notify care providers for any new concerns/ issues/ problems that arise  Patient will continue monitoring/ recording daily weightsand following action plan for CHF zones  Patient will continuenotsmoking  [place] outreach to continue with scheduled phone call post-upcoming scheduled cardiac MRI on November 03, 2019  [facility] CM Care Plan Problem One     Most Recent Value  Care Plan Problem One  High risk for hospital readmission related to/ as evidenced by multiple recent hospitalizations for Acute Respiratory Failure/ CHF exacerbation with most recent hospital discharge on 09/24/2019  Role Documenting the Problem One  Care Management Coordinator  Care Plan for Problem One  Active  [facility] Long Term Goal   Over the next 31 days, patient will not experience unplanned hospital readmission, as evidenced by patient reporting and review of EMR during [facility] RN CCM outreach [Goal extended today around recent ED visit]  [facility] Long Term Goal Start Date  10/29/19  Interventions for Problem One Long Term Goal  Discussed with patient her recent ED visit 10/15/2019,  confirmed that patient continues doing  well and has no current clinical concerns,  confirmed that she is taking medications as prescribed,  discussed recent gout flare with patient and encouraged her to contact PCP if her symptoms do not promptly resolve    [facility] CM Care Plan Problem Two     Most Recent Value  Care Plan Problem Two  Need for ongoing reinforcement of self-health management strategies for chronic disease state of CHF/ COPD, as evidenced by patient reporting and recent hospitalization for CHF exacerbation  Role Documenting the Problem Two  Care Management Coordinator  Care Plan for Problem Two  Active  Interventions for Problem Two Long Term Goal   Reviewed recent weights at home with patient,  confirmed that she remains able to independently verbalize weight gain guidelines in setting of CHF along with corresponding action plan,  discussed use of medications with patient and confirmed that she has no current medication concerns and is taking all as prescribed  [facility] Long Term Goal  Over the next 31 days, patient will continue to monitor and record daily weights at home and will follow action plan for yellow CHF zone, as evidenced by patient reporting and review of same during [place] CM outreach  [facility] Long Term Goal Start Date  09/25/19  [facility] Long Term Goal Met Date  10/29/19 [Goal met]  [facility] CM Short Term Goal #1   Over the next 21 days, patient will verbalize plan to obtain needed cardiac MRI, as evidenced by patient reporting/ collaboration with cardiology provider team as indicated during [facility] RN CM outreach [Goal re-established today]  [facility] CM Short Term Goal #1 Start Date  10/15/19  Interventions for Short Term Goal #2   Confirmed that patient plans to  attend scheduled MRI at [facility] on 11/03/2019 and confirmed that she has reliable transportation with back up plans should transportation through family members,  confirmed that patient understands where to go at [facility] and that she has a map and plans to leave her home early  to ensure being at appointment as scheduled     [name], RN, BSN, [place] Care Management  [phone]

## 2019-11-05 ENCOUNTER — Other Ambulatory Visit: Payer: Self-pay | Admitting: *Deleted

## 2019-11-05 ENCOUNTER — Encounter: Payer: Self-pay | Admitting: *Deleted

## 2019-11-05 NOTE — Patient Outreach (Signed)
[facility]) Care Management [place],  PCP completes Transition of Care follow up post-hospital discharge Post (most recent) discharge day # 20 11/05/2019  Ashley [name] [birth year] [id number]  Successful telephone outreach toDeborah Loyal [name], 68 y/o femalereferred to [place] by [facility] Liaison RN CM afterhospitalization July 21-25, 2059fr acute on chronic CHF exacerbation.Patient has history including, but not limited to, combined CHF; CAD with previous MI/ CABG, and ICD; HTN/ HLD; GERD; COPD;history oftobacco use- patient has quit smoking since June 19, 2019.  Unfortunately, patient experiencedmultiplerecenthospital readmissions: 1)August 13-15, 20263f acute respiratory failure with hypoxia/18 days after her hospital discharge, ; patient was again discharged home without home health services in place. 2) September 9-12, 2020 for acute pulmonary edema; respiratory failure with hypoxia; and CHF exacerbation/ 25 days after previous hospital discharge; was again discharged home without home health services in place, as patient continually refuses need for home health. 3) ED visit 08/02/2019 for chest pain; patient was also diagnosed with ETOH intoxication and discharged homeafter being ruled out for ACS/ MI 4)October 6-9,2020,24 days after her last hospital discharge, again for COPD/ CHF exacerbation. 5) October 18-21, 2020, 9 days after last hospital discharge; Acute Respiratory failure with flash pulmonary edema, secondary to CHF exacerbation/ severe MS/ MR: patient had cardiac catherization during this hospital admission and was determined to have significant 3-vessel CAD and EF of 20-25% 6) November 16-18, 2020(26 days afterlast hospital discharge), again with acute respiratory failure/ CHF exacerbation. 7) December 9-10, 2020 (21 days post-last hospital discharge), again with acute respiratory failure/ CHF  exacerbation.  HIPAA/ identity verified with patient;today, patient reports "doing fine and having no problems;" confirms that her previously reported gout has now completely resolved, with the use of OTC ibuprofen and she denies pain, new/ recent falls, and is no longer having to use walker for ambulation, now that gout has resolved.  Patient sounds to be in no distress throughout entirety of phone call today.  Patient further reports:  -- [facility] tolerated procedure without problems; was not given explanation of what she should expect in follow up when released from outpatient procedure; discussed with patient necessity of promptly scheduling cardiology provider appointment to discuss results, however, patient is not sure when the results will be ready; encouraged patient to go ahead and call to schedule cardiology appointment as soon as possible and encouraged her to contact me promptly should she run into challenges with scheduling.  Reviewed talking points for cardiology provider to determine course of action/ plan of care moving forward post- cardiac MRI- patient verbalizes understanding and agreement and states she will do all of above.  -- continues to monitor and record daily weights and vital signs; continues to reports "all in the right ranges;" reports weight ranges between 159-164 lbs, with a weight today of "163 lbs"; patient remains able to independently verbalize weight gain guidleines and corresponding action plan in setting of CHF; reports in "green zone" today and states she "actually feels really good today."  -- has and is taking all medications: denies medication concerns  -- reviewed with patient upcoming scheduled appointments and she continues to report reliable transportation through family members.  Patient denies further issues, concerns, or problems today. I confirmed that patient hasmy direct phone number, the main [facility] CM office  phone number, and the [facility] 24-hour nurse advice phone number should issues arise prior to next scheduled [facility] Community CM outreachin 2 weeks. Encouraged  patient to contact me directly if needs, questions, issues, or concerns arise prior to next scheduled outreach; patient agreed to do so.  Plan:  Patient will take medications as prescribed and will attend all scheduled provider appointments  Patient will promptly notify care providers for any new concerns/ issues/ problems that arise  Patient will continue monitoring/ recording daily weightsand following action plan for CHF zones  Patient will continuenotsmoking  I will send secure message via EMR to cardiology RN for Dr. Saralyn Pilar for care coordination to assist in patient obtaining prompt appointment post- recent cardiac MRI  [place] outreach to continue with scheduled phone call in 2 weeks  ===View-only below this line=== ----- Message ----- From: Knox Royalty, RN Sent: 11/05/2019  11:35 AM EST To: Isaias Cowman, MD, Sherri Sear Subject: FYI- patient had cardiac MRI/ needs follow u*  [place]- Just FYI, patient attended cardiac MRI as scheduled 11/03/2019 at [facility]; they did not provide any explanation as to what the patient should expect in follow up to receive results/ so that definitive plan can be put in place to address her multiple recent hospital admissions.  Given that the patient is approaching her historical re-admission time frame (she is 20 days post- last admission), I thought I'd let you know, so perhaps you could facilitate obtaining MRI results/ patient being seen in your office PROMPTLY, now that the MRI has been completed.  I just spoke with the patient today and she is doing well; she has plans to call and schedule follow up with your office soon, however, I wanted to see if you all could facilitate this, given her multiple admissions over the last 6 months, most of which  have occurred within 3 weeks of previous hospital discharges.  Patient would very much like to not have to go back to the hospital again until she is scheduled for definitive intervention.  Thank you for any assistance you are able to provide,  [name], RN, BSN, [place] Coordinator [facility] Care Management  [phone]  [facility] CM Care Plan Problem One     Most Recent Value  Care Plan Problem One  High risk for hospital readmission related to/ as evidenced by multiple recent hospitalizations for Acute Respiratory Failure/ CHF exacerbation with most recent hospital discharge on 10/16/2019  Role Documenting the Problem One  Care Management Coordinator  Care Plan for Problem One  Active  [facility] Long Term Goal   Over the next 31 days, patient will not experience unplanned hospital readmission, as evidenced by patient reporting and review of EMR during [facility] RN CCM outreach  [place] Term Goal Start Date  10/29/19  Interventions for Problem One Long Term Goal  Discussed with patient current clinical condition and confirmed that she has no current clinical concerns,  confirmed that her gout has resolved,  confirmed that she has no current medication concerns and is taking all as prescribed    [facility] CM Care Plan Problem Two     Most Recent Value  Care Plan Problem Two  Need for ongoing reinforcement of self-health management strategies for chronic disease state of CHF/ COPD, as evidenced by patient reporting and recent hospitalization for CHF exacerbation  Role Documenting the Problem Two  Care Management Coordinator  Care Plan for Problem Two  Active  [facility] CM Short Term Goal #1   Over the next 21 days, patient will verbalize plan to obtain needed cardiac MRI, as evidenced by patient reporting/  collaboration with cardiology provider team as indicated during [place] outreach  [facility] CM Short Term Goal #1 Start Date  10/15/19  [facility] CM Short Term Goal #1 Met Date   11/05/19 [Goal  met]  Interventions for Short Term Goal #2   Confirmed that patient attended cardiac MRI at [facility] on 11/03/2019 and tolerated without problems  [facility] CM Short Term Goal #2   Over the next 14 days, patient will schedule post- MRI cardiology appointment to discuss plan of care for needed intervention, as evidenced by patient reporting and collaboration with cardiology provider as indicated during [facility] RN CM outreach  [facility] CM Short Term Goal #2 Start Date  11/05/19  Interventions for Short Term Goal #2  Discussed follow up after MRI with patient,  assisted patient in talking points/ development of plan to schedule prompt cardiology appointment to discuss overall plan of care now that cardiac MRI has been completed,  reiterated role of [facility] RN CM and encouraged her to contact me promptly should any problems arise in scheduling cardiology follow up appointment,  reviewed all scheduled appointments with patient and confirmed that she plans to attend all as scheduled and continues tohave reliable transportation through family members     [name], [place], BSN, [employer] Coordinator [facility] Care Management  [phone]

## 2019-11-06 ENCOUNTER — Telehealth: Payer: Medicare Other

## 2019-11-11 ENCOUNTER — Telehealth: Payer: Self-pay | Admitting: Internal Medicine

## 2019-11-11 NOTE — Chronic Care Management (AMB) (Signed)
  Care Management   Note  11/11/2019 Name: [name] MRN: [id number] DOB: August 27, 1951  Ashley [name] is a 69 y.o. year old female who is a primary care patient of Derrel Nip, [name], MD and is actively engaged with the care management team. I reached out to Ashley [name] by phone today to assist with re-scheduling a follow up appointment with the Pharmacist  Follow up plan: Telephone appointment with care management team member scheduled for: 11/13/2019  [place], [place] Management  [place], [place] [address] Direct Dial: [email or web address]  Website: [email or web address]

## 2019-11-13 ENCOUNTER — Ambulatory Visit (INDEPENDENT_AMBULATORY_CARE_PROVIDER_SITE_OTHER): Payer: Medicare Other | Admitting: Pharmacist

## 2019-11-13 DIAGNOSIS — I5043 Acute on chronic combined systolic (congestive) and diastolic (congestive) heart failure: Secondary | ICD-10-CM | POA: Diagnosis not present

## 2019-11-13 DIAGNOSIS — J9611 Chronic respiratory failure with hypoxia: Secondary | ICD-10-CM

## 2019-11-13 DIAGNOSIS — J9612 Chronic respiratory failure with hypercapnia: Secondary | ICD-10-CM

## 2019-11-13 NOTE — Patient Instructions (Signed)
Visit Information  Goals Addressed            This Visit's Progress     Patient Stated   . "I want to stay out of the hospital" (pt-stated)       Current Barriers:  . Polypharmacy; complex patient with multiple comorbidities including HFrEF, COPD, CAD (hx NSTEMI, s/p CABG x3 and mitral valve repair in 2007), continued severe mitral valve regurgitation; barrett's esophagus, depression . Most recent ED visit 11/04/19 for HF o Cardiac MRI completed 11/03/2019. Patient notes that she does not have f/u scheduled w/ cardiology, and has not heard her results yet.  . Self-manages medications by filling a BID pill box. Has Medicare Extra Help- all brand medications $8.95 and generic $3.60 for 30 or 90 day supply o COPD: Tx Trelegy daily + albuterol HFA PRN or Duonebs PRN; follows w/ Dr. Patsey Berthold; notes she has only been needing nebulizer or rescue inhaler ~1 per week o CHF: HFrEF/CAD/mitral valve; managed on Entresto 24/26 BID, spironolactone 12.5 mg BID, carvedilol 6.25 mg BID, furosemide 40 mg BID follows w/ Dr. Saralyn Pilar and HF clinic. Weights remain stable at 160-163; notes she has only been getting a 30 day supply of Entresto o Insomnia: notes taking trazodone 50 mg QPM, alprazolam 0.25 mg QPM, and diphenhydramine 25 mg QPM for sleep.  o Hx tobacco abuse: patient has been TOBACCO FREE since 06/19/2019.  o Possible "gout flare"- patient notes today that she just got over a suspected gout flare. Notes that she has been taking ibuprofen for the pain. Per chart review, serum uric acid has not been checked.   Pharmacist Clinical Goal(s):  [name] Over the next 90 days, patient will work with PharmD and provider towards optimized medication management  Interventions: . Comprehensive medication review performed, medication list updated in electronic medical record . Counseled patient that she should AVOID NSAIDs in HF d/t risk of fluid retention and risk of triggering an exacerbation. Patient noted that she  was unaware of this. Discussed that APAP is an appropriate OTC pain relieving medication, but that there may be other more appropriate options for tx of gout, pending uric acid. Will communicate w/ Dr. Derrel Nip regarding need to schedule patient for follow up. Pending serum uric acid, consider allopurinol to treat to SUA <6.5, with colchicine for acute flare tx and possible overlap during allopurinol initiation.  [facility] cardiology. Left message w/ Dr. Saralyn Pilar' office requesting a 90 day supply of Entresto be sent to [facility]. Also requested that the patient be contacted w/ results of the cardiac MRI, as well as follow up scheduled. Encouraged patient to contact the cardiology office herself as well.  Patient Self Care Activities:  . Patient will take medications as prescribed . Patient will attend follow up appointments as scheduled.   Please see past updates related to this goal by clicking on the "Past Updates" button in the selected goal         The patient verbalized understanding of instructions provided today and declined a print copy of patient instruction materials.   Plan: - Will continue to collaborate w/ RN CM to support patient in goal of preventing readmissions - Scheduled follow up call 12/11/19 @ 3 pm  [name] Darnelle Maffucci, PharmD, [place], [place] Pharmacist [place] [phone]

## 2019-11-13 NOTE — Chronic Care Management (AMB) (Signed)
Chronic Care Management   Follow Up Note   11/13/2019 Name: Ashley [name] MRN: [id number] DOB: 1951-09-12  Referred by: [name], MD Reason for referral : Chronic Care Management (Medication Management)   Ashley [name] is a 69 y.o. year old female who is a primary care patient of [name], [name], MD. The CCM team was consulted for assistance with chronic disease management and care coordination needs.    Contacted patient for medication management review today.   Review of patient status, including review of consultants reports, relevant laboratory and other test results, and collaboration with appropriate care team members and the patient's provider was performed as part of comprehensive patient evaluation and provision of chronic care management services.    SDOH (Social Determinants of Health) screening performed today: Financial Strain  Stress. See Care Plan for related entries.   Outpatient Encounter Medications as of 11/13/2019  Medication Sig Note  . albuterol (VENTOLIN HFA) 108 (90 Base) MCG/ACT inhaler INHALE 2 PUFFS BY MOUTH INTO THE LUNGS EVERY 6 HOURS AS NEEDED FOR WHEEZING   . ALPRAZolam (XANAX) 0.5 MG tablet Take 0.5-1 tablets (0.25-0.5 mg total) by mouth at bedtime. TAKE ONE TABLET BY MOUTH AT BEDTIME AS NEEDED FOR ANXIETY (Patient taking differently: Take 0.25-0.5 mg by mouth at bedtime. ) 11/13/2019: Takes 1/2 tab QPM  . aspirin 81 MG tablet Take 81 mg by mouth daily.   [name] atorvastatin (LIPITOR) 40 MG tablet TAKE ONE TABLET BY MOUTH ONCE DAILY   . carvedilol (COREG) 6.25 MG tablet Take 1 tablet (6.25 mg total) by mouth 2 (two) times daily with a meal.   . clopidogrel (PLAVIX) 75 MG tablet Take 1 tablet (75 mg total) by mouth daily.   . cyanocobalamin (,VITAMIN B-12,) 1000 MCG/ML injection Inject 1 mL into the muscle monthly   . diphenhydrAMINE (DIPHENHIST) 25 mg capsule Take 25 mg by mouth at bedtime as needed for allergies or sleep.    [name] escitalopram (LEXAPRO)  10 MG tablet Take 1 tablet (10 mg total) by mouth daily.   . ferrous sulfate 325 (65 FE) MG EC tablet Take 325 mg by mouth daily with breakfast.   . Fluticasone-Umeclidin-Vilant (TRELEGY ELLIPTA) 100-62.5-25 MCG/INH AEPB Inhale 1 puff into the lungs daily.   . furosemide (LASIX) 40 MG tablet Take 1 tablet (40 mg total) by mouth 2 (two) times daily.   [name] ipratropium-albuterol (DUONEB) 0.5-2.5 (3) MG/3ML SOLN Take 3 mLs by nebulization every 6 (six) hours as needed. 09/25/2019: QPM  . omeprazole (PRILOSEC) 40 MG capsule Take 1 capsule (40 mg total) by mouth daily.   . potassium chloride (KLOR-CON) 10 MEQ tablet Take 1 tablet (10 mEq total) by mouth daily.   . sacubitril-valsartan (ENTRESTO) 24-26 MG Take 1 tablet by mouth 2 (two) times daily.   [name] spironolactone (ALDACTONE) 25 MG tablet Take 0.5 tablets (12.5 mg total) by mouth daily.   . traZODone (DESYREL) 50 MG tablet Take 0.5-1 tablets (25-50 mg total) by mouth daily. One hour before bedtime 09/25/2019: 1 tab QPM  . nitroGLYCERIN (NITROSTAT) 0.4 MG SL tablet Place 1 tablet (0.4 mg total) under the tongue every 5 (five) minutes as needed for chest pain. (Patient not taking: Reported on 11/13/2019)    No facility-administered encounter medications on file as of 11/13/2019.     Goals Addressed            This Visit's Progress     Patient Stated   . "I want to stay out of  the hospital" (pt-stated)       Current Barriers:  . Polypharmacy; complex patient with multiple comorbidities including HFrEF, COPD, CAD (hx NSTEMI, s/p CABG x3 and mitral valve repair in 2007), continued severe mitral valve regurgitation; barrett's esophagus, depression . Most recent ED visit 11/04/19 for HF o Cardiac MRI completed 11/03/2019. Patient notes that she does not have f/u scheduled w/ cardiology, and has not heard her results yet.  . Self-manages medications by filling a BID pill box. Has Medicare Extra Help- all brand medications $8.95 and generic $3.60 for 30 or  90 day supply o COPD: Tx Trelegy daily + albuterol HFA PRN or Duonebs PRN; follows w/ Dr. Patsey Berthold; notes she has only been needing nebulizer or rescue inhaler ~1 per week o CHF: HFrEF/CAD/mitral valve; managed on Entresto 24/26 BID, spironolactone 12.5 mg BID, carvedilol 6.25 mg BID, furosemide 40 mg BID follows w/ Dr. Saralyn Pilar and HF clinic. Weights remain stable at 160-163; notes she has only been getting a 30 day supply of Entresto o Insomnia: notes taking trazodone 50 mg QPM, alprazolam 0.25 mg QPM, and diphenhydramine 25 mg QPM for sleep.  o Hx tobacco abuse: patient has been TOBACCO FREE since 06/19/2019.  o Possible "gout flare"- patient notes today that she just got over a suspected gout flare. Notes that she has been taking ibuprofen for the pain. Per chart review, serum uric acid has not been checked.   Pharmacist Clinical Goal(s):  [name] Over the next 90 days, patient will work with PharmD and provider towards optimized medication management  Interventions: . Comprehensive medication review performed, medication list updated in electronic medical record . Counseled patient that she should AVOID NSAIDs in HF d/t risk of fluid retention and risk of triggering an exacerbation. Patient noted that she was unaware of this. Discussed that APAP is an appropriate OTC pain relieving medication, but that there may be other more appropriate options for tx of gout, pending uric acid. Will communicate w/ Dr. Derrel Nip regarding need to schedule patient for follow up. Pending serum uric acid, consider allopurinol to treat to SUA <6.5, with colchicine for acute flare tx and possible overlap during allopurinol initiation.  [facility] cardiology. Left message w/ Dr. Saralyn Pilar' office requesting a 90 day supply of Entresto be sent to [facility]. Also requested that the patient be contacted w/ results of the cardiac MRI, as well as follow up scheduled. Encouraged patient to contact the  cardiology office herself as well.  Patient Self Care Activities:  . Patient will take medications as prescribed . Patient will attend follow up appointments as scheduled.   Please see past updates related to this goal by clicking on the "Past Updates" button in the selected goal         Plan: - Will continue to collaborate w/ RN CM to support patient in goal of preventing readmissions - Scheduled follow up call 12/11/19 @ 3 pm  [name] Darnelle Maffucci, PharmD, [place], [place] Pharmacist [employer] [phone]

## 2019-11-14 IMAGING — DX DG CHEST 1V PORT
1 series · 1 of 1 positions shown · non-contrast
Comparison: Yesterday

CLINICAL DATA: Acute respiratory failure

EXAM:
PORTABLE CHEST 1 VIEW

[chest ap]
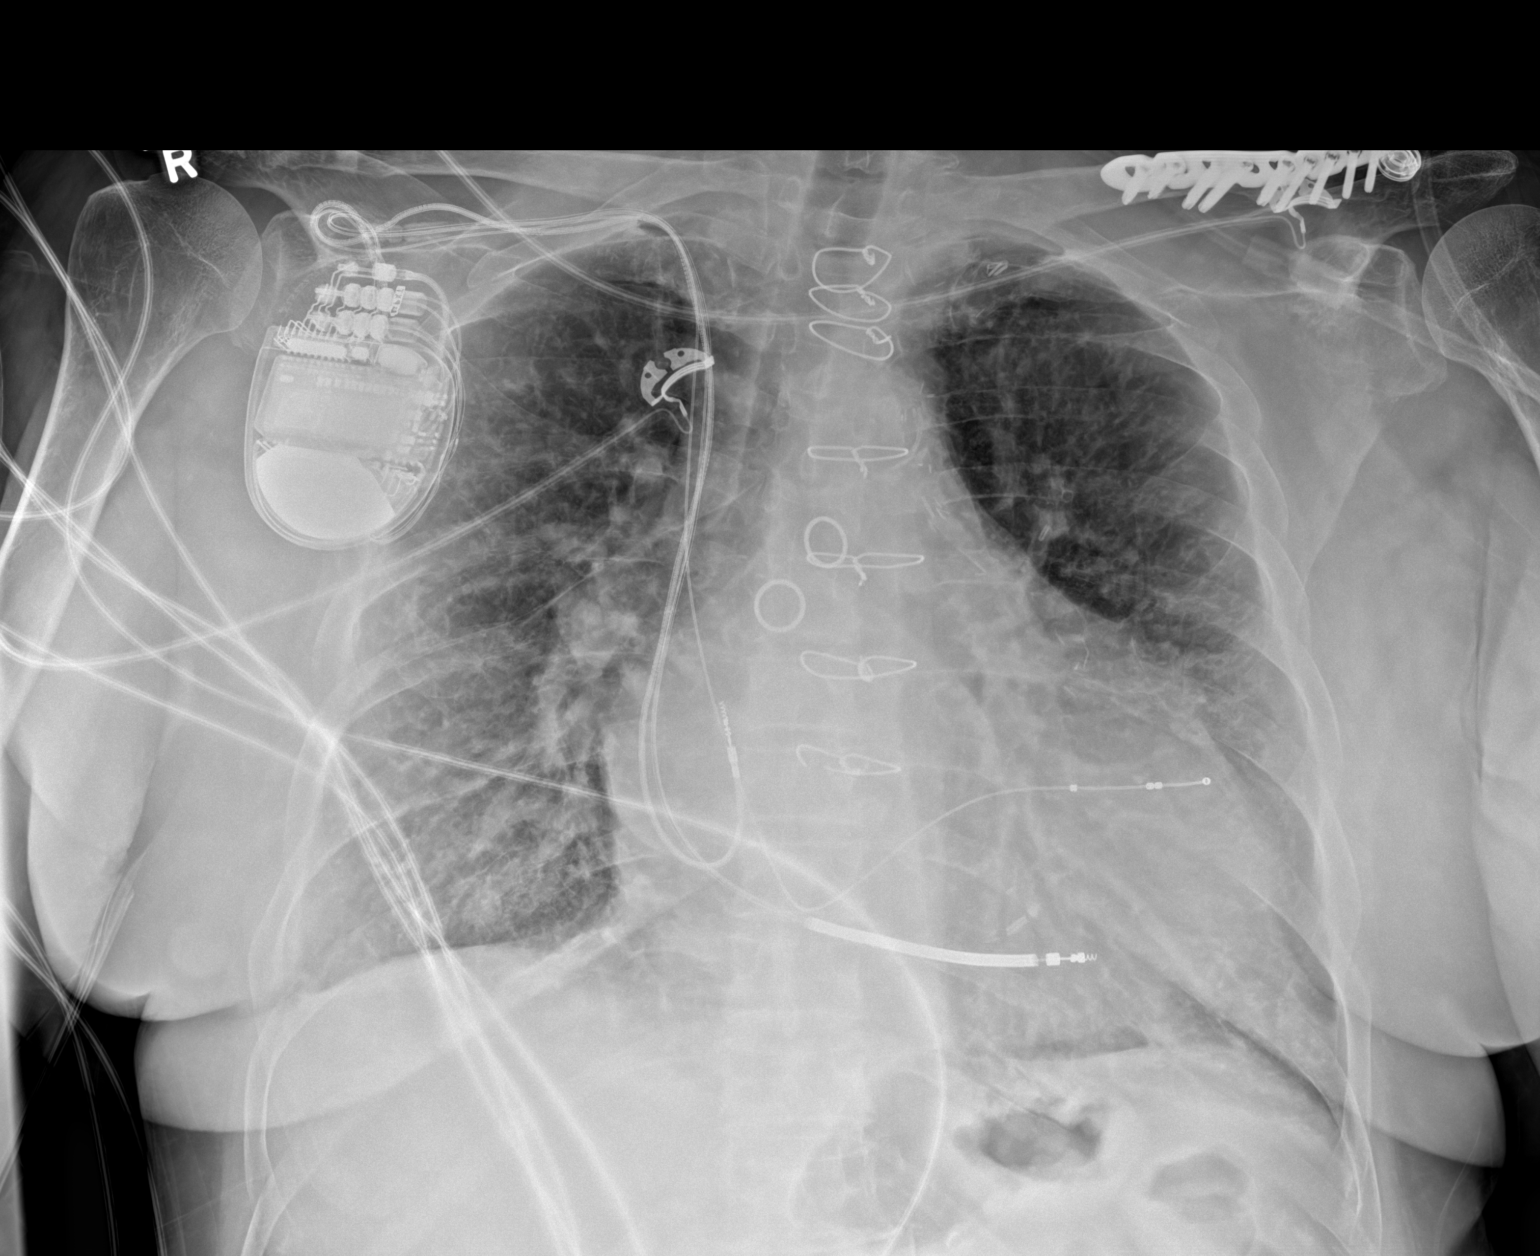

[1 of 1 positions shown; findings below may reference images not displayed]

FINDINGS: Cardiomegaly with diffuse interstitial opacity that is mildly
improved. CABG and biventricular pacer implant. No air bronchogram,
effusion, or pneumothorax. Nodular density at the right base with no
correlate on recent chest CT. No effusion or pneumothorax.
IMPRESSION: Improved pulmonary edema.

## 2019-11-17 DIAGNOSIS — Z743 Need for continuous supervision: Secondary | ICD-10-CM | POA: Diagnosis not present

## 2019-11-17 DIAGNOSIS — I5022 Chronic systolic (congestive) heart failure: Secondary | ICD-10-CM | POA: Diagnosis not present

## 2019-11-17 DIAGNOSIS — R069 Unspecified abnormalities of breathing: Secondary | ICD-10-CM | POA: Diagnosis not present

## 2019-11-17 DIAGNOSIS — R0602 Shortness of breath: Secondary | ICD-10-CM | POA: Diagnosis not present

## 2019-11-17 DIAGNOSIS — I2510 Atherosclerotic heart disease of native coronary artery without angina pectoris: Secondary | ICD-10-CM | POA: Diagnosis not present

## 2019-11-17 DIAGNOSIS — R0689 Other abnormalities of breathing: Secondary | ICD-10-CM | POA: Diagnosis not present

## 2019-11-17 DIAGNOSIS — I214 Non-ST elevation (NSTEMI) myocardial infarction: Secondary | ICD-10-CM | POA: Diagnosis not present

## 2019-11-17 DIAGNOSIS — I10 Essential (primary) hypertension: Secondary | ICD-10-CM | POA: Diagnosis not present

## 2019-11-17 DIAGNOSIS — E1165 Type 2 diabetes mellitus with hyperglycemia: Secondary | ICD-10-CM | POA: Diagnosis not present

## 2019-11-17 DIAGNOSIS — I5031 Acute diastolic (congestive) heart failure: Secondary | ICD-10-CM | POA: Diagnosis not present

## 2019-11-18 ENCOUNTER — Other Ambulatory Visit: Payer: Self-pay

## 2019-11-18 ENCOUNTER — Inpatient Hospital Stay
Admission: EM | Admit: 2019-11-18 | Discharge: 2019-11-19 | DRG: 291 | Disposition: A | Discharge status: Home / Self Care | Payer: Medicare Other | Attending: Family Medicine | Admitting: Family Medicine

## 2019-11-18 ENCOUNTER — Emergency Department: Payer: Medicare Other

## 2019-11-18 ENCOUNTER — Inpatient Hospital Stay (HOSPITAL_COMMUNITY)
Admit: 2019-11-18 | Discharge: 2019-11-18 | Disposition: A | Discharge status: Home / Self Care | Payer: Medicare Other | Attending: Internal Medicine | Admitting: Internal Medicine

## 2019-11-18 ENCOUNTER — Other Ambulatory Visit: Payer: Self-pay | Admitting: *Deleted

## 2019-11-18 DIAGNOSIS — I2510 Atherosclerotic heart disease of native coronary artery without angina pectoris: Secondary | ICD-10-CM | POA: Diagnosis not present

## 2019-11-18 DIAGNOSIS — J439 Emphysema, unspecified: Secondary | ICD-10-CM | POA: Diagnosis not present

## 2019-11-18 DIAGNOSIS — R0602 Shortness of breath: Secondary | ICD-10-CM | POA: Diagnosis not present

## 2019-11-18 DIAGNOSIS — J9601 Acute respiratory failure with hypoxia: Secondary | ICD-10-CM | POA: Diagnosis present

## 2019-11-18 DIAGNOSIS — Z7982 Long term (current) use of aspirin: Secondary | ICD-10-CM | POA: Diagnosis not present

## 2019-11-18 DIAGNOSIS — I252 Old myocardial infarction: Secondary | ICD-10-CM

## 2019-11-18 DIAGNOSIS — Z79899 Other long term (current) drug therapy: Secondary | ICD-10-CM

## 2019-11-18 DIAGNOSIS — I5023 Acute on chronic systolic (congestive) heart failure: Secondary | ICD-10-CM

## 2019-11-18 DIAGNOSIS — J9600 Acute respiratory failure, unspecified whether with hypoxia or hypercapnia: Secondary | ICD-10-CM | POA: Diagnosis present

## 2019-11-18 DIAGNOSIS — I110 Hypertensive heart disease with heart failure: Secondary | ICD-10-CM | POA: Diagnosis not present

## 2019-11-18 DIAGNOSIS — Z951 Presence of aortocoronary bypass graft: Secondary | ICD-10-CM | POA: Diagnosis not present

## 2019-11-18 DIAGNOSIS — I130 Hypertensive heart and chronic kidney disease with heart failure and stage 1 through stage 4 chronic kidney disease, or unspecified chronic kidney disease: Secondary | ICD-10-CM | POA: Diagnosis not present

## 2019-11-18 DIAGNOSIS — D649 Anemia, unspecified: Secondary | ICD-10-CM | POA: Diagnosis present

## 2019-11-18 DIAGNOSIS — N189 Chronic kidney disease, unspecified: Secondary | ICD-10-CM | POA: Diagnosis not present

## 2019-11-18 DIAGNOSIS — Z85038 Personal history of other malignant neoplasm of large intestine: Secondary | ICD-10-CM | POA: Diagnosis not present

## 2019-11-18 DIAGNOSIS — E785 Hyperlipidemia, unspecified: Secondary | ICD-10-CM | POA: Diagnosis present

## 2019-11-18 DIAGNOSIS — Z20822 Contact with and (suspected) exposure to covid-19: Secondary | ICD-10-CM | POA: Diagnosis not present

## 2019-11-18 DIAGNOSIS — Z841 Family history of disorders of kidney and ureter: Secondary | ICD-10-CM

## 2019-11-18 DIAGNOSIS — I5043 Acute on chronic combined systolic (congestive) and diastolic (congestive) heart failure: Secondary | ICD-10-CM | POA: Diagnosis not present

## 2019-11-18 DIAGNOSIS — Z7902 Long term (current) use of antithrombotics/antiplatelets: Secondary | ICD-10-CM | POA: Diagnosis not present

## 2019-11-18 DIAGNOSIS — Z8261 Family history of arthritis: Secondary | ICD-10-CM

## 2019-11-18 DIAGNOSIS — F329 Major depressive disorder, single episode, unspecified: Secondary | ICD-10-CM | POA: Diagnosis present

## 2019-11-18 DIAGNOSIS — K219 Gastro-esophageal reflux disease without esophagitis: Secondary | ICD-10-CM | POA: Diagnosis present

## 2019-11-18 DIAGNOSIS — I5031 Acute diastolic (congestive) heart failure: Secondary | ICD-10-CM | POA: Diagnosis not present

## 2019-11-18 DIAGNOSIS — I160 Hypertensive urgency: Secondary | ICD-10-CM | POA: Diagnosis not present

## 2019-11-18 DIAGNOSIS — Z09 Encounter for follow-up examination after completed treatment for conditions other than malignant neoplasm: Secondary | ICD-10-CM | POA: Diagnosis not present

## 2019-11-18 DIAGNOSIS — I739 Peripheral vascular disease, unspecified: Secondary | ICD-10-CM | POA: Diagnosis present

## 2019-11-18 DIAGNOSIS — Z95810 Presence of automatic (implantable) cardiac defibrillator: Secondary | ICD-10-CM | POA: Diagnosis not present

## 2019-11-18 DIAGNOSIS — I509 Heart failure, unspecified: Secondary | ICD-10-CM

## 2019-11-18 DIAGNOSIS — K5900 Constipation, unspecified: Secondary | ICD-10-CM | POA: Diagnosis not present

## 2019-11-18 DIAGNOSIS — Z8249 Family history of ischemic heart disease and other diseases of the circulatory system: Secondary | ICD-10-CM

## 2019-11-18 DIAGNOSIS — Z87891 Personal history of nicotine dependence: Secondary | ICD-10-CM | POA: Diagnosis not present

## 2019-11-18 DIAGNOSIS — I272 Pulmonary hypertension, unspecified: Secondary | ICD-10-CM | POA: Diagnosis not present

## 2019-11-18 DIAGNOSIS — M109 Gout, unspecified: Secondary | ICD-10-CM | POA: Diagnosis not present

## 2019-11-18 DIAGNOSIS — Z950 Presence of cardiac pacemaker: Secondary | ICD-10-CM | POA: Diagnosis present

## 2019-11-18 LAB — COMPREHENSIVE METABOLIC PANEL
ALT: 16 U/L (ref 0–44)
AST: 22 U/L (ref 15–41)
Albumin: 4.1 g/dL (ref 3.5–5.0)
Alkaline Phosphatase: 76 U/L (ref 38–126)
Anion gap: 16 — ABNORMAL HIGH (ref 5–15)
BUN: 21 mg/dL (ref 8–23)
CO2: 20 mmol/L — ABNORMAL LOW (ref 22–32)
Calcium: 8.8 mg/dL — ABNORMAL LOW (ref 8.9–10.3)
Chloride: 98 mmol/L (ref 98–111)
Creatinine, Ser: 1.28 mg/dL — ABNORMAL HIGH (ref 0.44–1.00)
GFR calc Af Amer: 50 mL/min — ABNORMAL LOW (ref >60)
GFR calc non Af Amer: 44 mL/min — ABNORMAL LOW (ref >60)
Glucose, Bld: 382 mg/dL — ABNORMAL HIGH (ref 70–99)
Potassium: 3.7 mmol/L (ref 3.5–5.1)
Sodium: 134 mmol/L — ABNORMAL LOW (ref 135–145)
Total Bilirubin: 1.5 mg/dL — ABNORMAL HIGH (ref 0.3–1.2)
Total Protein: 7.1 g/dL (ref 6.5–8.1)

## 2019-11-18 LAB — URINALYSIS, ROUTINE W REFLEX MICROSCOPIC
Bacteria, UA: NONE SEEN
Bilirubin Urine: NEGATIVE
Glucose, UA: 50 mg/dL — AB
Ketones, ur: NEGATIVE mg/dL
Leukocytes,Ua: NEGATIVE
Nitrite: NEGATIVE
Protein, ur: NEGATIVE mg/dL
Specific Gravity, Urine: 1.008 (ref 1.005–1.030)
pH: 6 (ref 5.0–8.0)

## 2019-11-18 LAB — BLOOD GAS, VENOUS
Acid-base deficit: 2.9 mmol/L — ABNORMAL HIGH (ref 0.0–2.0)
Bicarbonate: 22.6 mmol/L (ref 20.0–28.0)
O2 Saturation: 93.6 %
Patient temperature: 37
pCO2, Ven: 41 mmHg — ABNORMAL LOW (ref 44.0–60.0)
pH, Ven: 7.35 (ref 7.250–7.430)
pO2, Ven: 73 mmHg — ABNORMAL HIGH (ref 32.0–45.0)

## 2019-11-18 LAB — LACTIC ACID, PLASMA
Lactic Acid, Venous: 3.3 mmol/L (ref 0.5–1.9)
Lactic Acid, Venous: 4.8 mmol/L (ref 0.5–1.9)

## 2019-11-18 LAB — CBC
HCT: 37.2 % (ref 36.0–46.0)
Hemoglobin: 13 g/dL (ref 12.0–15.0)
MCH: 33.3 pg (ref 26.0–34.0)
MCHC: 34.9 g/dL (ref 30.0–36.0)
MCV: 95.4 fL (ref 80.0–100.0)
Platelets: 205 K/uL (ref 150–400)
RBC: 3.9 MIL/uL (ref 3.87–5.11)
RDW: 14.9 % (ref 11.5–15.5)
WBC: 12.3 K/uL — ABNORMAL HIGH (ref 4.0–10.5)
nRBC: 0 % (ref 0.0–0.2)

## 2019-11-18 LAB — CBC WITH DIFFERENTIAL/PLATELET
Abs Immature Granulocytes: 0.07 10*3/uL (ref 0.00–0.07)
Basophils Absolute: 0.2 10*3/uL — ABNORMAL HIGH (ref 0.0–0.1)
Basophils Relative: 1 %
Eosinophils Absolute: 0.3 10*3/uL (ref 0.0–0.5)
Eosinophils Relative: 3 %
HCT: 40.3 % (ref 36.0–46.0)
Hemoglobin: 14 g/dL (ref 12.0–15.0)
Immature Granulocytes: 1 %
Lymphocytes Relative: 19 %
Lymphs Abs: 2.4 10*3/uL (ref 0.7–4.0)
MCH: 33.4 pg (ref 26.0–34.0)
MCHC: 34.7 g/dL (ref 30.0–36.0)
MCV: 96.2 fL (ref 80.0–100.0)
Monocytes Absolute: 1.1 10*3/uL — ABNORMAL HIGH (ref 0.1–1.0)
Monocytes Relative: 9 %
Neutro Abs: 8.3 10*3/uL — ABNORMAL HIGH (ref 1.7–7.7)
Neutrophils Relative %: 67 %
Platelets: 293 10*3/uL (ref 150–400)
RBC: 4.19 MIL/uL (ref 3.87–5.11)
RDW: 14.8 % (ref 11.5–15.5)
WBC: 12.3 10*3/uL — ABNORMAL HIGH (ref 4.0–10.5)
nRBC: 0 % (ref 0.0–0.2)

## 2019-11-18 LAB — TROPONIN I (HIGH SENSITIVITY)
Troponin I (High Sensitivity): 20 ng/L — ABNORMAL HIGH (ref <18)
Troponin I (High Sensitivity): 23 ng/L — ABNORMAL HIGH (ref <18)

## 2019-11-18 LAB — ECHOCARDIOGRAM COMPLETE
Height: 63 in
Weight: 2656 [oz_av]

## 2019-11-18 LAB — RESPIRATORY PANEL BY RT PCR (FLU A&B, COVID)
Influenza A by PCR: NEGATIVE
Influenza B by PCR: NEGATIVE
SARS Coronavirus 2 by RT PCR: NEGATIVE

## 2019-11-18 LAB — PROCALCITONIN
Procalcitonin: <0.1 ng/mL
Procalcitonin: <0.1 ng/mL

## 2019-11-18 LAB — BRAIN NATRIURETIC PEPTIDE: B Natriuretic Peptide: 4403 pg/mL — ABNORMAL HIGH (ref 0.0–100.0)

## 2019-11-18 LAB — POC SARS CORONAVIRUS 2 AG: SARS Coronavirus 2 Ag: NEGATIVE

## 2019-11-18 LAB — MAGNESIUM: Magnesium: 1.7 mg/dL (ref 1.7–2.4)

## 2019-11-18 LAB — ETHANOL: Alcohol, Ethyl (B): <10 mg/dL (ref <10)

## 2019-11-18 MED ORDER — ENOXAPARIN SODIUM 40 MG/0.4ML ~~LOC~~ SOLN
40.0000 mg | SUBCUTANEOUS | Status: DC
  Administered 2019-11-18 – 2019-11-19 (×2): 40 mg via SUBCUTANEOUS
  Filled 2019-11-18 (×2): qty 0.4

## 2019-11-18 MED ORDER — PANTOPRAZOLE SODIUM 40 MG PO TBEC
40.0000 mg | DELAYED_RELEASE_TABLET | Freq: Every day | ORAL | Status: DC
  Administered 2019-11-18 – 2019-11-19 (×2): 40 mg via ORAL
  Filled 2019-11-18 (×2): qty 1

## 2019-11-18 MED ORDER — NITROGLYCERIN 0.4 MG SL SUBL: 0.4000 mg | SUBLINGUAL_TABLET | SUBLINGUAL | Status: DC | PRN

## 2019-11-18 MED ORDER — ONDANSETRON HCL 4 MG/2ML IJ SOLN: 4.0000 mg | Freq: Four times a day (QID) | INTRAMUSCULAR | Status: DC | PRN

## 2019-11-18 MED ORDER — FUROSEMIDE 10 MG/ML IJ SOLN
60.0000 mg | Freq: Two times a day (BID) | INTRAMUSCULAR | Status: DC
  Administered 2019-11-18: 60 mg via INTRAVENOUS
  Filled 2019-11-18: qty 8

## 2019-11-18 MED ORDER — ONDANSETRON HCL 4 MG/2ML IJ SOLN
4.0000 mg | INTRAMUSCULAR | Status: AC
  Administered 2019-11-18: 4 mg via INTRAVENOUS
  Filled 2019-11-18: qty 2

## 2019-11-18 MED ORDER — MORPHINE SULFATE (PF) 2 MG/ML IV SOLN
2.0000 mg | Freq: Once | INTRAVENOUS | Status: AC
  Administered 2019-11-18: 01:00:00 2 mg via INTRAVENOUS
  Filled 2019-11-18: qty 1

## 2019-11-18 MED ORDER — SODIUM CHLORIDE 0.9% FLUSH
3.0000 mL | Freq: Two times a day (BID) | INTRAVENOUS | Status: DC
  Administered 2019-11-18 – 2019-11-19 (×2): 3 mL via INTRAVENOUS

## 2019-11-18 MED ORDER — FUROSEMIDE 40 MG PO TABS
40.0000 mg | ORAL_TABLET | Freq: Two times a day (BID) | ORAL | Status: DC
  Administered 2019-11-19: 08:00:00 40 mg via ORAL
  Filled 2019-11-18: qty 1

## 2019-11-18 MED ORDER — SPIRONOLACTONE 25 MG PO TABS
12.5000 mg | ORAL_TABLET | Freq: Every day | ORAL | Status: DC
  Administered 2019-11-18 – 2019-11-19 (×2): 12.5 mg via ORAL
  Filled 2019-11-18 (×2): qty 0.5
  Filled 2019-11-18: qty 1

## 2019-11-18 MED ORDER — SODIUM CHLORIDE 0.9 % IV SOLN: 250.0000 mL | INTRAVENOUS | Status: DC | PRN

## 2019-11-18 MED ORDER — FLUTICASONE FUROATE-VILANTEROL 100-25 MCG/INH IN AEPB
1.0000 | INHALATION_SPRAY | Freq: Every day | RESPIRATORY_TRACT | Status: DC
  Administered 2019-11-18 – 2019-11-19 (×2): 1 via RESPIRATORY_TRACT
  Filled 2019-11-18: qty 28

## 2019-11-18 MED ORDER — FLUTICASONE-UMECLIDIN-VILANT 100-62.5-25 MCG/INH IN AEPB: 1.0000 | INHALATION_SPRAY | Freq: Every day | RESPIRATORY_TRACT | Status: DC

## 2019-11-18 MED ORDER — FUROSEMIDE 10 MG/ML IJ SOLN
40.0000 mg | Freq: Once | INTRAMUSCULAR | Status: AC
  Administered 2019-11-18: 40 mg via INTRAVENOUS
  Filled 2019-11-18: qty 4

## 2019-11-18 MED ORDER — ACETAMINOPHEN 325 MG PO TABS: 650.0000 mg | ORAL_TABLET | ORAL | Status: DC | PRN

## 2019-11-18 MED ORDER — UMECLIDINIUM BROMIDE 62.5 MCG/INH IN AEPB
1.0000 | INHALATION_SPRAY | Freq: Every day | RESPIRATORY_TRACT | Status: DC
  Administered 2019-11-18 – 2019-11-19 (×2): 1 via RESPIRATORY_TRACT
  Filled 2019-11-18: qty 7

## 2019-11-18 MED ORDER — SODIUM CHLORIDE 0.9% FLUSH: 3.0000 mL | INTRAVENOUS | Status: DC | PRN

## 2019-11-18 MED ORDER — IPRATROPIUM-ALBUTEROL 0.5-2.5 (3) MG/3ML IN SOLN: 3.0000 mL | Freq: Four times a day (QID) | RESPIRATORY_TRACT | Status: DC | PRN

## 2019-11-18 MED ORDER — CLOPIDOGREL BISULFATE 75 MG PO TABS
75.0000 mg | ORAL_TABLET | Freq: Every day | ORAL | Status: DC
  Administered 2019-11-18 – 2019-11-19 (×2): 75 mg via ORAL
  Filled 2019-11-18 (×2): qty 1

## 2019-11-18 MED ORDER — SACUBITRIL-VALSARTAN 24-26 MG PO TABS
1.0000 | ORAL_TABLET | Freq: Two times a day (BID) | ORAL | Status: DC
  Administered 2019-11-18: 1 via ORAL
  Filled 2019-11-18 (×3): qty 1

## 2019-11-18 MED ORDER — CARVEDILOL 6.25 MG PO TABS
6.2500 mg | ORAL_TABLET | Freq: Two times a day (BID) | ORAL | Status: DC
  Administered 2019-11-18 – 2019-11-19 (×2): 6.25 mg via ORAL
  Filled 2019-11-18 (×2): qty 1

## 2019-11-18 NOTE — ED Notes (Signed)
Pt given meal tray and milk to drink. Pt still tolerating nasal cannula well. Pt saturations currently 99% on 5L nasal cannula.

## 2019-11-18 NOTE — ED Notes (Signed)
Pt oxygen saturation remains 97% on room air. MD [place] aware. Pt respirations even and unlabored at this time

## 2019-11-18 NOTE — Patient Outreach (Addendum)
[place] [facility]) Care Management [place] Coordination Post-(most recent) hospital discharge day # 33 11/18/2019  Ashley [name] 05/30/1951 [id number]  [place] Telephone Outreach Care Coordination re:  [name], 69 y/o femalereferred to [place] by [facility] Liaison RN CM afterhospitalization July 21-25, 2040for acute on chronic CHF exacerbation.Patient has history including, but not limited to, combined CHF; CAD with previous MI/ CABG, and ICD; HTN/ HLD; GERD; COPD;history oftobacco use- patient has quit smoking since June 19, 2019.  Patient has multiple hospital re-admissions over the last year, most within 30 days of one another; all related to acute respiratory failure due to CHF exacerbation.  Patient has been presented to [facility] CM Case Discussion on multiple previous occasions.  Patient had recent cardiac catherization and cardiac MRI, both indicating that patient needs surgical mitral valve revision.  Noted from review of EMR that patient had cardiology office visit yesterday and reported that she experienced similar symptoms yesterday, along with reported weight gain, prior to visit-- cardiology recommended follow up with cardiac surgeon.  Unfortunately, patient re-presented to the ED this morning, again in acute respiratory failure; according to review of EMR plans to admit patient are in progress.  Secure communication via EMR sent to [place] and [facility] Pharmacist [name] Darnelle Maffucci, notifying of patient's current hospital admission  10:20 am: placed care coordination outreach to call to Natividad Brood, [facility] Liaison, who is very familiar with patient's overall case and discussed with her need for definitive surgical intervention   Plan:  [facility] RN CM will follow patient's progress while hospitalized and collaborate with [facility]  liaison's for discharge disposition  [name], RN, BSN, [place] Coordinator [facility] Care Management  [phone]

## 2019-11-18 NOTE — Progress Notes (Signed)
*  PRELIMINARY RESULTS* Echocardiogram 2D Echocardiogram has been performed.  Ashley [name] 11/18/2019, 2:36 PM

## 2019-11-18 NOTE — H&P (Signed)
History and Physical    Ashley [name] OVF:[id number] DOB: January 05, 1951 DOA: 11/18/2019  PCP: [name], MD   Patient coming from: home  I have personally briefly reviewed patient's old medical records in [place]  Chief Complaint: shortness of breath  HPI: Ashley [name] is a 69 y.o. female with medical history significant for COPD, pulmonary hypertension, chronic systolic CHF with EF of 40 to 45%,  significant CAD, status post ICD placement, hypertension, recurrent hospitalizations, most recently 10/15/2019 who presents to the emergency room with shortness of breath.  No associated chest pain.  During her last hospitalization she did complain of chest pain but it was thought to be noncardiac on cardiology evaluation.   Reports no fever or chills.  On arrival of EMS O2 sat was 75% on room air improving to 95% on nonrebreather and then CPAP. ED Course: On arrival in the emergency room she was transferred over to BiPAP with O2 sat 100%.  Blood pressure was elevated at 162/115 and heart rate 102 respirations 31.  She was afebrile at 98.4.  On her blood work troponin was 23, BNP was 4403.  EKG showed no acute ST-T wave changes.  White cell count slightly elevated at 12,000 with a normal hemoglobin.  Creatinine was 1.23, glucose 382.  Chest x-ray shows findings suggestive of interstitial edema and pulmonary vascular congestion.  She was treated with IV Lasix and morphine in the emergency room and hospitalist consulted for admission  Review of Systems: As per HPI otherwise 10 point review of systems negative.    Past Medical History:  Diagnosis Date  . AICD (automatic cardioverter/defibrillator) present    on right side  . Anemia   . CAD (coronary artery disease)    s/p CABG  . CHF (congestive heart failure) ([place])   . Chronic kidney disease    renal artery stenosis  . Colon cancer ([place])   . Depression   . GERD (gastroesophageal reflux disease)   . Hx of colonic polyps   .  Hyperlipidemia   . Hypertension   . Mitral valve disorder    s/p mitral valve repair wth CABG  . Myocardial infarction ([place])   . Peripheral vascular disease ([place])   . Presence of permanent cardiac pacemaker    Pacemaker/ Defibrillator  . Tobacco abuse 10/11/2014    Past Surgical History:  Procedure Laterality Date  . arm surgery     fracture, has plates and screws  . CABG with mitral valve repair    . CARDIAC CATHETERIZATION    . COLON SURGERY     colon cancer  . COLONOSCOPY WITH PROPOFOL N/A 10/09/2016   Procedure: COLONOSCOPY WITH PROPOFOL;  Surgeon: Jonathon Bellows, MD;  Location: [facility] ENDOSCOPY;  Service: Endoscopy;  Laterality: N/A;  . COLONOSCOPY WITH PROPOFOL N/A 07/24/2018   Procedure: COLONOSCOPY WITH PROPOFOL;  Surgeon: Virgel Manifold, MD;  Location: [facility] ENDOSCOPY;  Service: Endoscopy;  Laterality: N/A;  . CORONARY ARTERY BYPASS GRAFT    . ENTEROSCOPY N/A 11/21/2018   Procedure: ENTEROSCOPY-BALLOON;  Surgeon: Jonathon Bellows, MD;  Location: [facility] ENDOSCOPY;  Service: Gastroenterology;  Laterality: N/A;  . ESOPHAGOGASTRODUODENOSCOPY (EGD) WITH PROPOFOL N/A 07/24/2018   Procedure: ESOPHAGOGASTRODUODENOSCOPY (EGD) WITH PROPOFOL;  Surgeon: Virgel Manifold, MD;  Location: [facility] ENDOSCOPY;  Service: Endoscopy;  Laterality: N/A;  . GIVENS CAPSULE STUDY N/A 08/16/2018   Procedure: GIVENS CAPSULE STUDY;  Surgeon: Virgel Manifold, MD;  Location: [facility] ENDOSCOPY;  Service: Endoscopy;  Laterality: N/A;  . pace  maker defib  2016  . PERIPHERAL VASCULAR CATHETERIZATION N/A 10/23/2016   Procedure: Renal Angiography;  Surgeon: Algernon Huxley, MD;  Location: [place] CV LAB;  Service: Cardiovascular;  Laterality: N/A;  . RIGHT/LEFT HEART CATH AND CORONARY ANGIOGRAPHY N/A 08/26/2019   Procedure: RIGHT/LEFT HEART CATH AND CORONARY ANGIOGRAPHY;  Surgeon: Isaias Cowman, MD;  Location: [place] [facility];  Service: Cardiovascular;  Laterality: N/A;  . TOTAL ABDOMINAL HYSTERECTOMY   1999   history of abnormal pap     reports that she quit smoking about 5 months ago. Her smoking use included cigarettes. She smoked 0.25 packs per day. She has never used smokeless tobacco. She reports current alcohol use of about 2.0 standard drinks of alcohol per week. She reports that she does not use drugs.  Allergies  Allergen Reactions  . Vancomycin Nausea And Vomiting and Palpitations  . Hydrocodone-Acetaminophen Nausea Only    Family History  Problem Relation Age of [place]  . Arthritis Mother   . Cancer Mother        uterus cancer  . Hyperlipidemia Mother   . Hypertension Mother   . Heart disease Mother   . Diabetes Mother   . Hyperlipidemia Father   . Hypertension Father   . Heart disease Father   . Diabetes Father   . Cancer Sister        ovary cancer  . Diabetes Maternal Grandmother   . Hypertension Maternal Grandmother   . Arthritis Maternal Grandmother   . Hypertension Maternal Grandfather   . Hypertension Paternal Grandmother   . Hypertension Paternal Grandfather   . Heart disease Paternal Grandfather   . Heart disease Brother   . Kidney disease Brother   . Breast cancer Neg Hx      Prior to Admission medications   Medication Sig Start Date End Date Taking? Authorizing Provider  albuterol (VENTOLIN HFA) 108 (90 Base) MCG/ACT inhaler INHALE 2 PUFFS BY MOUTH INTO THE LUNGS EVERY 6 HOURS AS NEEDED FOR WHEEZING Patient taking differently: Inhale 2 puffs into the lungs every 6 (six) hours as needed for wheezing.  09/04/19  Yes Tyler Pita, MD  ALPRAZolam Duanne Moron) 0.5 MG tablet Take 0.5-1 tablets (0.25-0.5 mg total) by mouth at bedtime. TAKE ONE TABLET BY MOUTH AT BEDTIME AS NEEDED FOR ANXIETY Patient taking differently: Take 0.25-0.5 mg by mouth at bedtime.  09/05/19  Yes [name], MD  aspirin 81 MG tablet Take 81 mg by mouth daily.   Yes [provider]  atorvastatin (LIPITOR) 40 MG tablet TAKE ONE TABLET BY MOUTH ONCE DAILY Patient  taking differently: Take 40 mg by mouth daily.  07/25/19  Yes [name], MD  carvedilol (COREG) 6.25 MG tablet Take 1 tablet (6.25 mg total) by mouth 2 (two) times daily with a meal. 09/05/19  Yes [name], MD  clopidogrel (PLAVIX) 75 MG tablet Take 1 tablet (75 mg total) by mouth daily. 09/05/19  Yes Hackney, Otila Kluver A, FNP  cyanocobalamin (,VITAMIN B-12,) 1000 MCG/ML injection Inject 1 mL into the muscle monthly Patient taking differently: Inject 1,000 mcg into the muscle every 30 (thirty) days.  09/05/19  Yes [name], MD  diphenhydrAMINE (DIPHENHIST) 25 mg capsule Take 25 mg by mouth at bedtime as needed for allergies or sleep.    Yes [provider]  escitalopram (LEXAPRO) 10 MG tablet Take 1 tablet (10 mg total) by mouth daily. 09/05/19  Yes [name], MD  ferrous sulfate 325 (65 FE)  MG EC tablet Take 325 mg by mouth daily with breakfast.   Yes [provider]  Fluticasone-Umeclidin-Vilant (TRELEGY ELLIPTA) 100-62.5-25 MCG/INH AEPB Inhale 1 puff into the lungs daily. 09/04/19  Yes Tyler Pita, MD  furosemide (LASIX) 40 MG tablet Take 1 tablet (40 mg total) by mouth 2 (two) times daily. 09/24/19  Yes Fritzi Mandes, MD  ipratropium-albuterol (DUONEB) 0.5-2.5 (3) MG/3ML SOLN Take 3 mLs by nebulization every 6 (six) hours as needed. Patient taking differently: Take 3 mLs by nebulization every 6 (six) hours as needed (wheezing/ shortness of breath).  08/05/18  Yes [name], MD  omeprazole (PRILOSEC) 40 MG capsule Take 1 capsule (40 mg total) by mouth daily. 09/05/19  Yes [name], MD  potassium chloride (KLOR-CON) 10 MEQ tablet Take 1 tablet (10 mEq total) by mouth daily. 09/05/19  Yes [name], MD  sacubitril-valsartan (ENTRESTO) 24-26 MG Take 1 tablet by mouth 2 (two) times daily. 09/05/19  Yes Hackney, Otila Kluver A, FNP  spironolactone (ALDACTONE) 25 MG tablet Take 0.5 tablets (12.5 mg total) by mouth daily. 09/05/19 09/04/20 Yes Hackney,  Otila Kluver A, FNP  traZODone (DESYREL) 50 MG tablet Take 0.5-1 tablets (25-50 mg total) by mouth daily. One hour before bedtime Patient taking differently: Take 50 mg by mouth daily. One hour before bedtime 09/05/19  Yes [name], MD  nitroGLYCERIN (NITROSTAT) 0.4 MG SL tablet Place 1 tablet (0.4 mg total) under the tongue every 5 (five) minutes as needed for chest pain. 09/03/19   [name], MD    Physical Exam: Vitals:   11/18/19 0013 11/18/19 0016 11/18/19 0026 11/18/19 0030  BP:  (!) 148/110  (!) 141/102  Pulse:  100 (!) 106 (!) 105  Resp:  (!) 26 (!) 23 (!) 26  Temp:      TempSrc:      SpO2:  100% 100% 100%  Weight: 75.3 kg     Height: 5\' 3"  (1.6 m)        Vitals:   11/18/19 0013 11/18/19 0016 11/18/19 0026 11/18/19 0030  BP:  (!) 148/110  (!) 141/102  Pulse:  100 (!) 106 (!) 105  Resp:  (!) 26 (!) 23 (!) 26  Temp:      TempSrc:      SpO2:  100% 100% 100%  Weight: 75.3 kg     Height: 5\' 3"  (1.6 m)       Constitutional: Increased work of breathing.  BiPAP mask on, A&O x3 Eyes: PERRL, lids and conjunctivae normal ENMT: Mucous membranes are moist.  Neck: normal, supple, no masses, no thyromegaly Respiratory: Bilateral crackles.  inCreased respiratory effort.  Tachypnea cardiovascular: Regular rate and rhythm, no murmurs / rubs / gallops. No extremity edema. 2+ pedal pulses. No carotid bruits.  Abdomen: no tenderness, no masses palpated. No hepatosplenomegaly. Bowel sounds positive.  Musculoskeletal: no clubbing / cyanosis. No joint deformity upper and lower extremities.  Skin: no rashes, lesions, ulcers.  Neurologic: No gross focal neurologic deficit. Psychiatric: Normal mood and affect.   Labs on Admission: I have personally reviewed following labs and imaging studies  CBC: Recent Labs  Lab 11/18/19 0005  WBC 12.3*  NEUTROABS 8.3*  HGB 14.0  HCT 40.3  MCV 96.2  PLT 160   Basic Metabolic Panel: Recent Labs  Lab 11/18/19 0005  NA 134*  K 3.7  CL  98  CO2 20*  GLUCOSE 382*  BUN 21  CREATININE 1.28*  CALCIUM 8.8*  MG 1.7  GFR: Estimated Creatinine Clearance: 42 mL/min (A) (by C-G formula based on SCr of 1.28 mg/dL (H)). Liver Function Tests: Recent Labs  Lab 11/18/19 0005  AST 22  ALT 16  ALKPHOS 76  BILITOT 1.5*  PROT 7.1  ALBUMIN 4.1   No results for input(s): LIPASE, AMYLASE in the last 168 hours. No results for input(s): AMMONIA in the last 168 hours. Coagulation Profile: No results for input(s): INR, PROTIME in the last 168 hours. Cardiac Enzymes: No results for input(s): CKTOTAL, CKMB, CKMBINDEX, TROPONINI in the last 168 hours. BNP (last 3 results) No results for input(s): PROBNP in the last 8760 hours. HbA1C: No results for input(s): HGBA1C in the last 72 hours. CBG: No results for input(s): GLUCAP in the last 168 hours. Lipid Profile: No results for input(s): CHOL, HDL, LDLCALC, TRIG, CHOLHDL, LDLDIRECT in the last 72 hours. Thyroid Function Tests: No results for input(s): TSH, T4TOTAL, FREET4, T3FREE, THYROIDAB in the last 72 hours. Anemia Panel: No results for input(s): VITAMINB12, FOLATE, FERRITIN, TIBC, IRON, RETICCTPCT in the last 72 hours. Urine analysis:    Component Value Date/Time   COLORURINE YELLOW (A) 05/29/2019 0810   APPEARANCEUR CLEAR (A) 05/29/2019 0810   APPEARANCEUR Clear 08/19/2014 0025   LABSPEC 1.006 05/29/2019 0810   LABSPEC 1.009 08/19/2014 0025   PHURINE 6.0 05/29/2019 0810   GLUCOSEU NEGATIVE 05/29/2019 0810   GLUCOSEU 50 mg/dL 08/19/2014 0025   HGBUR SMALL (A) 05/29/2019 0810   BILIRUBINUR NEGATIVE 05/29/2019 0810   BILIRUBINUR Negative 08/19/2014 0025   KETONESUR NEGATIVE 08/24/2019 1442   PROTEINUR NEGATIVE 05/29/2019 0810   NITRITE NEGATIVE 05/29/2019 0810   LEUKOCYTESUR NEGATIVE 05/29/2019 0810   LEUKOCYTESUR Negative 08/19/2014 0025    Radiological Exams on Admission: DG Chest Port 1 View  Result Date: 11/18/2019 CLINICAL DATA:  Shortness of breath EXAM:  PORTABLE CHEST 1 VIEW COMPARISON:  October 15, 2019 FINDINGS: The heart size and mediastinal contours are unchanged. Again noted is a right-sided pacemaker with stable lead tips. Overlying median sternotomy wires. There is diffusely increased interstitial markings seen throughout both lungs. There is prominent pulmonary vasculature. No acute osseous abnormality. The patient is status post plate fixation of the distal left clavicle. IMPRESSION: Findings suggestive of interstitial edema and pulmonary vascular congestion. Stable cardiomegaly Electronically Signed   By: Prudencio Pair M.D.   On: 11/18/2019 00:33    EKG: Independently reviewed.   Assessment/Plan    Acute respiratory failure (HCC) with hypoxia   Acute on chronic systolic CHF (congestive heart failure) (HCC) IV Lasix Continue home beta-blockers and Entresto once reconciled Echocardiogram in the a.m.  Last echo was in 05/2019 EF 40 to 45% Daily weights with salt and fluid restriction Continue BiPAP and wean to O2 via nasal cannula then off as tolerated.   No history of home oxygen use     CAD (coronary artery disease) Complaints of chest pain Continue to cycle troponins Continue antiplatelets, statins and beta-blockers  Hypertensive urgency Blood pressure is 162/115 Continue home meds    COPD with emphysema ([place]) Not acutely exacerbated. Shortness of breath believed to be mostly related to heart failure Continue home bronchodilators    Chronic kidney disease Renal function at baseline          DVT prophylaxis: lovenox Code Status: full code  Family Communication: none  Disposition Plan: Back to previous home environment Consults called: none     Athena Masse MD [facility]     11/18/2019, 1:12 AM

## 2019-11-18 NOTE — ED Notes (Signed)
Patient resting quietly in room with bipap in place, SpO2 at 100, with no apparent acute distress and expressing no needs at this time.  Purewick in place, bed in lowest position, call light within reach.  Will continue to monitor.

## 2019-11-18 NOTE — Progress Notes (Signed)
PROGRESS NOTE    Ashley [name]  POE:[id number] DOB: [birth year] DOA: 11/18/2019 PCP: [name], MD       Brief Narrative:  Ashley [name] is a 69 y.o. F with COPD not on O2, sCHF EF 40-45% with ICD, pHTN, CAD and HTN who presented with dyspnea and orthopnea.  In the ER, SpO2 75% and placed on BiPAP.  CXR showed edema.  BNP 4400.  STarted on IV Lasix and hospitalist service were asked to evaluate for admission.       Assessment & Plan:  Acute on chronic systolic CHF Pulmonary hypertension, secondary Acute hypoxic respiratory failure Has been able to wean off of BiPAP, still on supplemental oxygen. -Furosemide 60 mg IV twice a day  -K supplement -Strict I/Os, daily weights, telemetry  -Daily monitoring renal function    COPD -Continue home LABA, LAMA, ICS -Continue pantoprazole  Coronary disease secondary prevention Hypertension -Continue carvedilol, spironolactone -Hold Entresto given soft blood pressure -Continue Plavix     DVT prophylaxis: Lovenox Code Status: Full code Family Communication:  MDM and disposition Plan: This is a no charge note.  For further details, please see H&P by my partner Dr. Damita Dunnings from earlier today.  The below labs and imaging reports were reviewed and summarized above.    The patient was admitted with acute on chronic systolic CHF with acute hypoxic respiratory failure requiring BiPAP.  Her respiratory failure is improving, we are weaning her off oxygen.  If we are able to wean off oxygen by this evening, will convert to oral Lasix, possibly home by tomorrow.    Objective: Vitals:   11/18/19 1515 11/18/19 1530 11/18/19 1545 11/18/19 1600  BP:  129/75  (!) 105/53  Pulse: 78 77 77 74  Resp: (!) 24 (!) 21 20 (!) 26  Temp:      TempSrc:      SpO2: 98% 99% 99% 98%  Weight:      Height:        Intake/Output Summary (Last 24 hours) at 11/18/2019 1902 Last data filed at 11/18/2019 0522 Gross per 24 hour  Intake --  Output  450 ml  Net -450 ml   Filed Weights   11/18/19 0013  Weight: 75.3 kg    Examination: The patient was seen and examined.      Data Reviewed: I have personally reviewed following labs and imaging studies:  CBC: Recent Labs  Lab 11/18/19 0005 11/18/19 0244  WBC 12.3* 12.3*  NEUTROABS 8.3*  --   HGB 14.0 13.0  HCT 40.3 37.2  MCV 96.2 95.4  PLT 293 315   Basic Metabolic Panel: Recent Labs  Lab 11/18/19 0005  NA 134*  K 3.7  CL 98  CO2 20*  GLUCOSE 382*  BUN 21  CREATININE 1.28*  CALCIUM 8.8*  MG 1.7   GFR: Estimated Creatinine Clearance: 42 mL/min (A) (by C-G formula based on SCr of 1.28 mg/dL (H)). Liver Function Tests: Recent Labs  Lab 11/18/19 0005  AST 22  ALT 16  ALKPHOS 76  BILITOT 1.5*  PROT 7.1  ALBUMIN 4.1   No results for input(s): LIPASE, AMYLASE in the last 168 hours. No results for input(s): AMMONIA in the last 168 hours. Coagulation Profile: No results for input(s): INR, PROTIME in the last 168 hours. Cardiac Enzymes: No results for input(s): CKTOTAL, CKMB, CKMBINDEX, TROPONINI in the last 168 hours. BNP (last 3 results) No results for input(s): PROBNP in the last 8760 hours. HbA1C: No results for input(s):  HGBA1C in the last 72 hours. CBG: No results for input(s): GLUCAP in the last 168 hours. Lipid Profile: No results for input(s): CHOL, HDL, LDLCALC, TRIG, CHOLHDL, LDLDIRECT in the last 72 hours. Thyroid Function Tests: No results for input(s): TSH, T4TOTAL, FREET4, T3FREE, THYROIDAB in the last 72 hours. Anemia Panel: No results for input(s): VITAMINB12, FOLATE, FERRITIN, TIBC, IRON, RETICCTPCT in the last 72 hours. Urine analysis:    Component Value Date/Time   COLORURINE STRAW (A) 11/18/2019 0244   APPEARANCEUR CLEAR (A) 11/18/2019 0244   APPEARANCEUR Clear 08/19/2014 0025   LABSPEC 1.008 11/18/2019 0244   LABSPEC 1.009 08/19/2014 0025   PHURINE 6.0 11/18/2019 0244   GLUCOSEU 50 (A) 11/18/2019 0244   GLUCOSEU 50 mg/dL  08/19/2014 0025   HGBUR SMALL (A) 11/18/2019 0244   BILIRUBINUR NEGATIVE 11/18/2019 0244   BILIRUBINUR Negative 08/19/2014 0025   KETONESUR NEGATIVE 11/18/2019 0244   PROTEINUR NEGATIVE 11/18/2019 0244   NITRITE NEGATIVE 11/18/2019 0244   LEUKOCYTESUR NEGATIVE 11/18/2019 0244   LEUKOCYTESUR Negative 08/19/2014 0025   Sepsis Labs: @LABRCNTIP (procalcitonin:4,lacticacidven:4)  ) Recent Results (from the past 240 hour(s))  Respiratory Panel by RT PCR (Flu A&B, Covid) - Nasopharyngeal Swab     Status: None   Collection Time: 11/18/19 12:05 AM   Specimen: Nasopharyngeal Swab  Result Value Ref Range Status   SARS Coronavirus 2 by RT PCR NEGATIVE NEGATIVE Final    Comment: (NOTE) SARS-CoV-2 target nucleic acids are NOT DETECTED. The SARS-CoV-2 RNA is generally detectable in upper respiratoy specimens during the acute phase of infection. The lowest concentration of SARS-CoV-2 viral copies this assay can detect is 131 copies/mL. A negative result does not preclude SARS-Cov-2 infection and should not be used as the sole basis for treatment or other patient management decisions. A negative result may occur with  improper specimen collection/handling, submission of specimen other than nasopharyngeal swab, presence of viral mutation(s) within the areas targeted by this assay, and inadequate number of viral copies (<131 copies/mL). A negative result must be combined with clinical observations, patient history, and epidemiological information. The expected result is Negative. Fact Sheet for Patients:  PinkCheek.be Fact Sheet for Healthcare Providers:  GravelBags.it This test is not yet ap proved or cleared by the [place] FDA and  has been authorized for detection and/or diagnosis of SARS-CoV-2 by FDA under an Emergency Use Authorization (EUA). This EUA will remain  in effect (meaning this test can be used) for the duration of  the COVID-19 declaration under Section 564(b)(1) of the Act, 21 U.S.C. section 360bbb-3(b)(1), unless the authorization is terminated or revoked sooner.    Influenza A by PCR NEGATIVE NEGATIVE Final   Influenza B by PCR NEGATIVE NEGATIVE Final    Comment: (NOTE) The Xpert Xpress SARS-CoV-2/FLU/RSV assay is intended as an aid in  the diagnosis of influenza from Nasopharyngeal swab specimens and  should not be used as a sole basis for treatment. Nasal washings and  aspirates are unacceptable for Xpert Xpress SARS-CoV-2/FLU/RSV  testing. Fact Sheet for Patients: PinkCheek.be Fact Sheet for Healthcare Providers: GravelBags.it This test is not yet approved or cleared by the [place] FDA and  has been authorized for detection and/or diagnosis of SARS-CoV-2 by  FDA under an Emergency Use Authorization (EUA). This EUA will remain  in effect (meaning this test can be used) for the duration of the  Covid-19 declaration under Section 564(b)(1) of the Act, 21  U.S.C. section 360bbb-3(b)(1), unless the authorization is  terminated or revoked. Performed at  [facility], [address], [place], [place] [address]          Radiology Studies: DG Chest [place] 1 View  Result Date: 11/18/2019 CLINICAL DATA:  Shortness of breath EXAM: PORTABLE CHEST 1 VIEW COMPARISON:  October 15, 2019 FINDINGS: The heart size and mediastinal contours are unchanged. Again noted is a right-sided pacemaker with stable lead tips. Overlying median sternotomy wires. There is diffusely increased interstitial markings seen throughout both lungs. There is prominent pulmonary vasculature. No acute osseous abnormality. The patient is status post plate fixation of the distal left clavicle. IMPRESSION: Findings suggestive of interstitial edema and pulmonary vascular congestion. Stable cardiomegaly Electronically Signed   By: Prudencio Pair M.D.   On: 11/18/2019  00:33   ECHOCARDIOGRAM COMPLETE  Result Date: 11/18/2019   ECHOCARDIOGRAM REPORT   Patient Name:   CHERYLANNE ARDELEAN [facility] Date of Exam: 11/18/2019 Medical Rec #:  [id number]         Height:       63.0 in Accession #:    [id number]        Weight:       166.0 lb Date of Birth:  1951/11/02         BSA:          1.79 m Patient Age:    22 years          BP:           101/55 mmHg Patient Gender: F                 HR:           69 bpm. Exam Location:  [facility] Procedure: 2D Echo, Color Doppler and Cardiac Doppler Indications:     CHF- acute diastolic 989.21  History:         Patient has prior history of Echocardiogram examinations, most                  recent 05/27/2019. Pacemaker; Risk Factors:Hypertension. MI,                  Mitral valve disorder.  Sonographer:     Sherrie Sport RDCS (AE) Referring Phys:  [id number] Athena Masse Diagnosing Phys: Nelva Bush MD IMPRESSIONS  1. Left ventricular ejection fraction, by visual estimation, is 30 to 35%. The left ventricle has severely decreased function. There is severely increased left ventricular hypertrophy.  2. Left ventricular diastolic parameters are indeterminate.  3. The left ventricle demonstrates global hypokinesis.  4. Global right ventricle has moderately reduced systolic function.The right ventricular size is normal. Mildly increased right ventricular wall thickness.  5. Left atrial size was mild-moderately dilated.  6. Right atrial size was normal.  7. Presence of pericardial fat pad.  8. The mitral valve has been repaired/replaced. There is at least moderate mitral valve regurgitation. Moderate-severe mitral stenosis.  9. The tricuspid valve is not well visualized. 10. The aortic valve was not well visualized. Aortic valve regurgitation is not visualized. Mild to moderate aortic valve sclerosis/calcification without any evidence of aortic stenosis. 11. The pulmonic valve was not well visualized. Pulmonic valve regurgitation is not visualized. 12. TR signal is  inadequate for assessing pulmonary artery systolic pressure. 13. A pacer wire is visualized in the RV. 14. MV peak gradient, 23.8 mmHg. 15. The interatrial septum was not well visualized. FINDINGS  Left Ventricle: Left ventricular ejection fraction, by visual estimation, is 30 to 35%. The left ventricle has severely decreased function. The left  ventricle demonstrates global hypokinesis. The left ventricular internal cavity size was the left ventricle is normal in size. There is severely increased left ventricular hypertrophy. Left ventricular diastolic parameters are indeterminate. Right Ventricle: The right ventricular size is normal. Mildly increased right ventricular wall thickness. Global RV systolic function is has moderately reduced systolic function. Left Atrium: Left atrial size was mild-moderately dilated. Right Atrium: Right atrial size was normal in size Pericardium: There is no evidence of pericardial effusion. Presence of pericardial fat pad. Mitral Valve: The mitral valve has been repaired/replaced. There is at least moderate mitral valve regurgitation. Moderate-severe mitral valve stenosis by observation. MV peak gradient, 23.8 mmHg. Tricuspid Valve: The tricuspid valve is not well visualized. Tricuspid valve regurgitation is trivial. Aortic Valve: The aortic valve was not well visualized. . There is moderate thickening and moderate calcification of the aortic valve. Aortic valve regurgitation is not visualized. Mild to moderate aortic valve sclerosis/calcification is present, without  any evidence of aortic stenosis. There is moderate thickening of the aortic valve. There is moderate calcification of the aortic valve. Aortic valve mean gradient measures 3.7 mmHg. Aortic valve peak gradient measures 6.0 mmHg. Aortic valve area, by VTI  measures 2.14 cm. Pulmonic Valve: The pulmonic valve was not well visualized. Pulmonic valve regurgitation is not visualized. Pulmonic regurgitation is not visualized.  No evidence of pulmonic stenosis. Aorta: The aortic root is normal in size and structure. Pulmonary Artery: The pulmonary artery is not well seen. Venous: The inferior vena cava was not well visualized. IAS/Shunts: The interatrial septum was not well visualized. Additional Comments: A pacer wire is visualized in the right ventricle.  LEFT VENTRICLE PLAX 2D LVIDd:         4.17 cm  Diastology LVIDs:         2.86 cm  LV e' lateral:   2.72 cm/s LV PW:         1.90 cm  LV E/e' lateral: 59.9 LV IVS:        1.40 cm  LV e' medial:    2.94 cm/s LVOT diam:     2.10 cm  LV E/e' medial:  55.4 LV SV:         46 ml LV SV Index:   24.88 LVOT Area:     3.46 cm  RIGHT VENTRICLE RV Basal diam:  3.51 cm RV S prime:     7.94 cm/s LEFT ATRIUM             Index       RIGHT ATRIUM           Index LA diam:        5.90 cm 3.30 cm/m  RA Area:     11.00 cm LA Vol (A2C):   73.5 ml 41.14 ml/m RA Volume:   22.00 ml  12.32 ml/m LA Vol (A4C):   88.3 ml 49.43 ml/m LA Biplane Vol: 82.6 ml 46.24 ml/m  AORTIC VALVE                   PULMONIC VALVE AV Area (Vmax):    1.55 cm    PV Vmax:        0.65 m/s AV Area (Vmean):   1.42 cm    PV Peak grad:   1.7 mmHg AV Area (VTI):     2.14 cm    RVOT Peak grad: 3 mmHg AV Vmax:           122.00 cm/s AV Vmean:  86.967 cm/s AV VTI:            0.241 m AV Peak Grad:      6.0 mmHg AV Mean Grad:      3.7 mmHg LVOT Vmax:         54.70 cm/s LVOT Vmean:        35.600 cm/s LVOT VTI:          0.149 m LVOT/AV VTI ratio: 0.62  AORTA Ao Root diam: 3.30 cm MITRAL VALVE MV Area (PHT): 2.36 cm              SHUNTS MV Peak grad:  23.8 mmHg             Systemic VTI:  0.15 m MV Mean grad:  10.0 mmHg             Systemic Diam: 2.10 cm MV Vmax:       2.44 m/s MV Vmean:      145.0 cm/s MV VTI:        0.61 m MV PHT:        93.38 msec MV Decel Time: 322 msec MV E velocity: 163.00 cm/s 103 cm/s MV A velocity: 99.40 cm/s  70.3 cm/s MV E/A ratio:  1.64        1.5  Farzad Tibbetts End MD Electronically signed by Nelva Bush  MD Signature Date/Time: 11/18/2019/4:13:04 PM    Final         Scheduled Meds: . carvedilol  6.25 mg Oral BID WC  . clopidogrel  75 mg Oral Daily  . enoxaparin (LOVENOX) injection  40 mg Subcutaneous Q24H  . fluticasone furoate-vilanterol  1 puff Inhalation Daily   And  . umeclidinium bromide  1 puff Inhalation Daily  . [START ON 11/19/2019] furosemide  40 mg Oral BID  . pantoprazole  40 mg Oral Daily  . sodium chloride flush  3 mL Intravenous Q12H  . spironolactone  12.5 mg Oral Daily   Continuous Infusions: . sodium chloride       LOS: 0 days    Time spent: 25 minutes    Edwin Dada, MD [facility] 11/18/2019, 7:02 PM     Please page though [place] or Epic secure chat:  For password, contact charge nurse

## 2019-11-18 NOTE — ED Notes (Signed)
Awaiting inhaler treatments to be sent from pharmacy

## 2019-11-18 NOTE — ED Notes (Signed)
Patient resting quietly in room and expressing no needs at this time.  Purewick is in place, bed in lowest position, call light within reach.  Will continue to monitor.

## 2019-11-18 NOTE — ED Notes (Signed)
This RN attempted to call report at this time. Charge RN on floor has not assigned pt to a nurse yet

## 2019-11-18 NOTE — ED Notes (Signed)
Date and time results received: 11/18/19 12:43 AM  Test: Lactic Critical Value: 4.8  Name of Provider Notified: Karma Greaser MD

## 2019-11-18 NOTE — ED Triage Notes (Addendum)
Pt arrived from home via [facility] for shortness of breath.  Hx of CHF, COPD.  Pt denies hx of diabetes.    EMS provided vitals: 79% room air, 95% on non-rebreather labored, 96% on CPAP pk 7.5 with decreased WOB BP 125/96 HR 120 RR 20-30 CBG 399   EMS gave 125 solumedrol

## 2019-11-18 NOTE — ED Provider Notes (Signed)
[facility] Emergency Department Provider Note  ____________________________________________   First MD Initiated Contact with Patient 11/18/19 0007     (approximate)  I have reviewed the triage vital signs and the nursing notes.   HISTORY  Chief Complaint Shortness of Breath  Level 5 caveat:  history/ROS limited by acute/critical illness  HPI Ashley [name] is a 69 y.o. female with a complicated and extensive medical history that includes both CHF and COPD as well as a pacemaker/defibrillator, prior MI, peripheral vascular disease, etc.  She presents tonight by EMS for gradually worsening shortness of breath over the last 24 hours that has become severe.  She says that she started feeling short of breath in the morning and has gotten worse and worse.  She says that she has gained about 3 pounds last couple of days.  She denies fever, sore throat, chest pain, nausea, vomiting, and abdominal pain.  Her presentation tonight feels similar to other visit she has had to the emergency department (this is her ninth visit in 6 months with 6 past admissions).  When EMS arrived at her home, they report that she was satting in the mid 82s on room air and she was tripoding and using accessory muscles in severe distress.  She was started first on nonrebreather by the first responders which brought up to the 90s and then EMS put her on CPAP for transport to the hospital.  They also gave her a dose of Solu-Medrol 125 mg IV.  Upon arrival to the ED she is on CPAP and has an oxygen saturation in the upper 90s and says she is starting to feel better but she is still tripoding.        Past Medical History:  Diagnosis Date  . AICD (automatic cardioverter/defibrillator) present    on right side  . Anemia   . CAD (coronary artery disease)    s/p CABG  . CHF (congestive heart failure) ([place])   . Chronic kidney disease    renal artery stenosis  . Colon cancer ([place])   .  Depression   . GERD (gastroesophageal reflux disease)   . Hx of colonic polyps   . Hyperlipidemia   . Hypertension   . Mitral valve disorder    s/p mitral valve repair wth CABG  . Myocardial infarction ([place])   . Peripheral vascular disease ([place])   . Presence of permanent cardiac pacemaker    Pacemaker/ Defibrillator  . Tobacco abuse 10/11/2014    Patient Active Problem List   Diagnosis Date Noted  . Mitral stenosis with insufficiency   . Hypertensive urgency   . Chronic renal failure, stage 3a   . Depression   . Respiratory failure with hypoxia ([place]) 09/22/2019  . Hyperglycemia 09/22/2019  . Presence of permanent cardiac pacemaker   . Anemia   . Chronic kidney disease   . Hypertension   . Mitral valve disorder 08/25/2019  . Acute respiratory failure ([place]) 08/24/2019  . Acute CHF ([place]) 08/12/2019  . CHF (congestive heart failure) ([place]) 07/16/2019  . Cough with hemoptysis 06/09/2019  . Insomnia 05/07/2019  . Hypokalemia 03/23/2019  . Hyperglycemia, drug-induced 03/23/2019  . COVID-19 virus not detected 03/23/2019  . Respiratory failure ([place]) 03/16/2019  . AVM (arteriovenous malformation) of small bowel, acquired   . Anemia, iron deficiency 11/10/2018  . Acute on chronic systolic CHF (congestive heart failure) ([place]) 11/06/2018  . Rectal polyp   . Benign neoplasm of cecum   . Barrett's esophagus without  dysplasia   . Stomach irritation   . Chronic diastolic heart failure ([place]) 07/03/2018  . HTN (hypertension) 07/03/2018  . 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 B12 deficiency anemia 06/28/2018  . Hypotension 05/11/2018  . Prediabetes 05/11/2018  . Personal history of colon cancer   . Benign neoplasm of descending colon   . Polyp of sigmoid colon   . Benign neoplasm of transverse colon   . Diverticulosis of large intestine without diverticulitis   . 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 Renal artery stenosis ([place]) 10/03/2016  . Failure of implantable  cardioverter-defibrillator (ICD) lead 02/09/2015  . Hospital discharge follow-up 02/09/2015  . Cough in adult 10/21/2014  . Tobacco abuse counseling 10/11/2014  . Tobacco abuse 10/11/2014  . Chronic right hip pain 08/26/2014  . Atherosclerosis of native artery of extremity with intermittent claudication ([place]) 06/18/2013  . Preoperative evaluation to rule out surgical contraindication 06/18/2013  . CAD (coronary artery disease) 06/01/2013  . GERD (gastroesophageal reflux disease) 06/01/2013  . Hypercholesterolemia 06/01/2013  . Tubular adenoma of colon 06/01/2013  . 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 arm surgery     fracture, has plates and screws  . CABG with mitral valve repair    . CARDIAC CATHETERIZATION    . COLON SURGERY     colon cancer  . COLONOSCOPY WITH PROPOFOL N/A 10/09/2016   Procedure: COLONOSCOPY WITH PROPOFOL;  Surgeon: Jonathon Bellows, MD;  Location: [facility] ENDOSCOPY;  Service: Endoscopy;  Laterality: N/A;  . COLONOSCOPY WITH PROPOFOL N/A 07/24/2018   Procedure: COLONOSCOPY WITH PROPOFOL;  Surgeon: Virgel Manifold, MD;  Location: [facility] ENDOSCOPY;  Service: Endoscopy;  Laterality: N/A;  . CORONARY ARTERY BYPASS GRAFT    . ENTEROSCOPY N/A 11/21/2018   Procedure: ENTEROSCOPY-BALLOON;  Surgeon: Jonathon Bellows, MD;  [facility] END[facility];  Service: Gastroenterology;  Laterality: N/A;  . ESOPHAGOGASTRODUODENOSCOPY (EGD) WITH PROPOFOL N/A 07/24/2018   Procedure: ESOPHAGOGASTRODUODENOSCOPY (EGD) WITH PROPOFOL;  Surgeon: Virgel Manifold, MD;  Location: [facility] ENDOSCOPY;  Service: Endoscopy;  Laterality: N/A;  . GIVENS CAPSULE STUDY N/A 08/16/2018   Procedure: GIVENS CAPSULE STUDY;  Surgeon: Virgel Manifold, MD;  Location: [facility] ENDOSCOPY;  Service: Endoscopy;  Laterality: N/A;  . pace maker defib  2016  . PERIPHERAL VASCULAR CATHETERIZATION N/A 10/23/2016   Procedure: Renal Angiography;  Surgeon:  Algernon Huxley, MD;  Location: [place] CV LAB;  Service: Cardiovascular;  Laterality: N/A;  . RIGHT/LEFT HEART CATH AND CORONARY ANGIOGRAPHY N/A 08/26/2019   Procedure: RIGHT/LEFT HEART CATH AND CORONARY ANGIOGRAPHY;  Surgeon: Isaias Cowman, MD;  Location: [place] CV LAB;  Service: Cardiovascular;  Laterality: N/A;  . TOTAL ABDOMINAL HYSTERECTOMY  1999   history of abnormal pap    Prior to Admission medications   Medication Sig Start Date End Date Taking? Authorizing Provider  albuterol (VENTOLIN HFA) 108 (90 Base) MCG/ACT inhaler INHALE 2 PUFFS BY MOUTH INTO THE LUNGS EVERY 6 HOURS AS NEEDED FOR WHEEZING Patient taking differently: Inhale 2 puffs into the lungs every 6 (six) hours as needed for wheezing.  09/04/19  Yes Tyler Pita, MD  ALPRAZolam Duanne Moron) 0.5 MG tablet Take 0.5-1 tablets (0.25-0.5 mg total) by mouth at bedtime. TAKE ONE TABLET BY MOUTH AT BEDTIME AS NEEDED FOR ANXIETY Patient taking differently: Take 0.25-0.5 mg by mouth at bedtime.  09/05/19  Yes [name], MD  aspirin 81 MG tablet Take 81 mg by mouth daily.   Yes  [provider]  atorvastatin (LIPITOR) 40 MG tablet TAKE ONE TABLET BY MOUTH ONCE DAILY Patient taking differently: Take 40 mg by mouth daily.  07/25/19  Yes [name], MD  carvedilol (COREG) 6.25 MG tablet Take 1 tablet (6.25 mg total) by mouth 2 (two) times daily with a meal. 09/05/19  Yes [name], MD  clopidogrel (PLAVIX) 75 MG tablet Take 1 tablet (75 mg total) by mouth daily. 09/05/19  Yes Hackney, Otila Kluver A, FNP  cyanocobalamin (,VITAMIN B-12,) 1000 MCG/ML injection Inject 1 mL into the muscle monthly Patient taking differently: Inject 1,000 mcg into the muscle every 30 (thirty) days.  09/05/19  Yes [name], MD  diphenhydrAMINE (DIPHENHIST) 25 mg capsule Take 25 mg by mouth at bedtime as needed for allergies or sleep.    Yes [provider]  escitalopram (LEXAPRO) 10 MG tablet Take 1 tablet (10 mg  total) by mouth daily. 09/05/19  Yes [name], MD  ferrous sulfate 325 (65 FE) MG EC tablet Take 325 mg by mouth daily with breakfast.   Yes [provider]  Fluticasone-Umeclidin-Vilant (TRELEGY ELLIPTA) 100-62.5-25 MCG/INH AEPB Inhale 1 puff into the lungs daily. 09/04/19  Yes Tyler Pita, MD  furosemide (LASIX) 40 MG tablet Take 1 tablet (40 mg total) by mouth 2 (two) times daily. 09/24/19  Yes Fritzi Mandes, MD  ipratropium-albuterol (DUONEB) 0.5-2.5 (3) MG/3ML SOLN Take 3 mLs by nebulization every 6 (six) hours as needed. Patient taking differently: Take 3 mLs by nebulization every 6 (six) hours as needed (wheezing/ shortness of breath).  08/05/18  Yes [name], MD  omeprazole (PRILOSEC) 40 MG capsule Take 1 capsule (40 mg total) by mouth daily. 09/05/19  Yes [name], MD  potassium chloride (KLOR-CON) 10 MEQ tablet Take 1 tablet (10 mEq total) by mouth daily. 09/05/19  Yes [name], MD  sacubitril-valsartan (ENTRESTO) 24-26 MG Take 1 tablet by mouth 2 (two) times daily. 09/05/19  Yes Hackney, Otila Kluver A, FNP  spironolactone (ALDACTONE) 25 MG tablet Take 0.5 tablets (12.5 mg total) by mouth daily. 09/05/19 09/04/20 Yes Hackney, Otila Kluver A, FNP  traZODone (DESYREL) 50 MG tablet Take 0.5-1 tablets (25-50 mg total) by mouth daily. One hour before bedtime Patient taking differently: Take 50 mg by mouth daily. One hour before bedtime 09/05/19  Yes [name], MD  nitroGLYCERIN (NITROSTAT) 0.4 MG SL tablet Place 1 tablet (0.4 mg total) under the tongue every 5 (five) minutes as needed for chest pain. 09/03/19   [name], MD    Allergies Vancomycin and Hydrocodone-acetaminophen  Family History  Problem Relation Age of Onset  . Arthritis Mother   . Cancer Mother        uterus cancer  . Hyperlipidemia Mother   . Hypertension Mother   . Heart disease Mother   . Diabetes Mother   . Hyperlipidemia Father   . Hypertension Father   . Heart disease  Father   . Diabetes Father   . Cancer Sister        ovary cancer  . Diabetes Maternal Grandmother   . Hypertension Maternal Grandmother   . Arthritis Maternal Grandmother   . Hypertension Maternal Grandfather   . Hypertension Paternal Grandmother   . Hypertension Paternal Grandfather   . Heart disease Paternal Grandfather   . Heart disease Brother   . Kidney disease Brother   . Breast cancer Neg Hx     Social History Social History   Tobacco  Use  . Smoking status: Former Smoker    Packs/day: 0.25    Types: Cigarettes    Quit date: 06/19/2019    Years since quitting: 0.4  . Smokeless tobacco: Never Used  . Tobacco comment: reports smoking 2-3 cigarettes/ day  Substance Use Topics  . Alcohol use: Yes    Alcohol/week: 2.0 standard drinks    Types: 2 Shots of liquor per week    Comment: occasion  . Drug use: No    Review of Systems Level 5 caveat:  history/ROS limited by acute/critical illness   constitutional: No fever/chills Eyes: No visual changes. ENT: No sore throat. Cardiovascular: Denies chest pain. Respiratory: Shortness of breath Gastrointestinal: No abdominal pain.  No nausea, no vomiting.  No diarrhea.  No constipation. Genitourinary: Negative for dysuria. Musculoskeletal: Negative for neck pain.  Negative for back pain. Integumentary: Negative for rash. Neurological: Negative for headaches, focal weakness or numbness.   ____________________________________________   PHYSICAL EXAM:  VITAL SIGNS: ED Triage Vitals  Enc Vitals Group     BP 11/18/19 0010 (!) 162/115     Pulse Rate 11/18/19 0010 (!) 102     Resp 11/18/19 0010 (!) 31     Temp 11/18/19 0010 98.4 F (36.9 C)     Temp Source 11/18/19 0010 Oral     SpO2 11/18/19 0010 100 %     Weight 11/18/19 0013 75.3 kg (166 lb)     Height 11/18/19 0013 1.6 m (5\' 3" )     Head Circumference --      Peak Flow --      Pain Score 11/18/19 0013 7     Pain Loc --      Pain Edu? --      Excl. in [place]? --       Constitutional: Alert and oriented.  Eyes: Conjunctivae are normal.  Head: Atraumatic. Nose: No congestion/rhinnorhea. Mouth/Throat: Patient is wearing a mask. Neck: No stridor.  No meningeal signs.   Cardiovascular: Mild tachycardia, regular rhythm. Good peripheral circulation. Grossly normal heart sounds. Respiratory: Increased respiratory effort with accessory muscle usage and intercostal muscle retractions and tachypnea.  However the patient's lung sounds are generally clear with no wheezing and no significant coarse breath sounds or rales. Gastrointestinal: Soft and nontender. No distention.  Musculoskeletal: No lower extremity tenderness nor edema. No gross deformities of extremities. Neurologic:  Normal speech and language. No gross focal neurologic deficits are appreciated.  Skin:  Skin is warm, dry and intact. Psychiatric: Mood and affect are normal. Speech and behavior are normal.  ____________________________________________   LABS (all labs ordered are listed, but only abnormal results are displayed)  Labs Reviewed  BLOOD GAS, VENOUS - Abnormal; Notable for the following components:      Result Value   pCO2, Ven 41 (*)    pO2, Ven 73.0 (*)    Acid-base deficit 2.9 (*)    All other components within normal limits  CBC WITH DIFFERENTIAL/PLATELET - Abnormal; Notable for the following components:   WBC 12.3 (*)    Neutro Abs 8.3 (*)    Monocytes Absolute 1.1 (*)    Basophils Absolute 0.2 (*)    All other components within normal limits  BRAIN NATRIURETIC PEPTIDE - Abnormal; Notable for the following components:   B Natriuretic Peptide 4,403.0 (*)    All other components within normal limits  COMPREHENSIVE METABOLIC PANEL - Abnormal; Notable for the following components:   Sodium 134 (*)    CO2 20 (*)  Glucose, Bld 382 (*)    Creatinine, Ser 1.28 (*)    Calcium 8.8 (*)    Total Bilirubin 1.5 (*)    GFR calc non Af Amer 44 (*)    GFR calc Af Amer 50 (*)     Anion gap 16 (*)    All other components within normal limits  LACTIC ACID, PLASMA - Abnormal; Notable for the following components:   Lactic Acid, Venous 4.8 (*)    All other components within normal limits  TROPONIN I (HIGH SENSITIVITY) - Abnormal; Notable for the following components:   Troponin I (High Sensitivity) 23 (*)    All other components within normal limits  RESPIRATORY PANEL BY RT PCR (FLU A&B, COVID)  ETHANOL  MAGNESIUM  LACTIC ACID, PLASMA  PROCALCITONIN  URINALYSIS, ROUTINE W REFLEX MICROSCOPIC  POC SARS CORONAVIRUS 2 AG -  ED  POC SARS CORONAVIRUS 2 AG   ____________________________________________  EKG  ED ECG REPORT I, Hinda Kehr, the attending physician, personally viewed and interpreted this ECG.  Date: 11/18/2019 EKG Time: 00: 08 Rate: 104 Rhythm: Sinus tachycardia QRS Axis: normal Intervals: Right bundle branch block ST/T Wave abnormalities: Non-specific ST segment / T-wave changes, but no clear evidence of acute ischemia. Narrative Interpretation: no definitive evidence of acute ischemia; does not meet STEMI criteria.  No significant change from prior EKG from a month ago.   ____________________________________________  RADIOLOGY I, Hinda Kehr, personally viewed and evaluated these images (plain radiographs) as part of my medical decision making, as well as reviewing the written report by the radiologist.  ED MD interpretation:  Pulmonary vascular congestion  Official radiology report(s): DG Chest Port 1 View  Result Date: 11/18/2019 CLINICAL DATA:  Shortness of breath EXAM: PORTABLE CHEST 1 VIEW COMPARISON:  October 15, 2019 FINDINGS: The heart size and mediastinal contours are unchanged. Again noted is a right-sided pacemaker with stable lead tips. Overlying median sternotomy wires. There is diffusely increased interstitial markings seen throughout both lungs. There is prominent pulmonary vasculature. No acute osseous abnormality. The patient  is status post plate fixation of the distal left clavicle. IMPRESSION: Findings suggestive of interstitial edema and pulmonary vascular congestion. Stable cardiomegaly Electronically Signed   By: Prudencio Pair M.D.   On: 11/18/2019 00:33    ____________________________________________   PROCEDURES   Procedure(s) performed (including Critical Care):  .Critical Care Performed by: Hinda Kehr, MD Authorized by: Hinda Kehr, MD   Critical care provider statement:    Critical care time (minutes):  30   Critical care time was exclusive of:  Separately billable procedures and treating other patients   Critical care was necessary to treat or prevent imminent or life-threatening deterioration of the following conditions:  Respiratory failure   Critical care was time spent personally by me on the following activities:  Development of treatment plan with patient or surrogate, discussions with consultants, evaluation of patient's response to treatment, examination of patient, obtaining history from patient or surrogate, ordering and performing treatments and interventions, ordering and review of laboratory studies, ordering and review of radiographic studies, pulse oximetry, re-evaluation of patient's condition and review of old charts     ____________________________________________   [place] / MDM / Benjamin Perez / ED COURSE  As part of my medical decision making, I reviewed the following data within the [facility] notes reviewed and incorporated, Labs reviewed , EKG interpreted , Old EKG reviewed, Old chart reviewed, Radiograph reviewed , Discussed with admitting physician (Dr. Damita Dunnings)  and Notes from prior ED visits   Differential diagnosis includes, but is not limited to, CHF exacerbation, COPD exacerbation, COVID-19, community-acquired pneumonia, PE, ACS.  The patient has both CHF and COPD.  She has been seen in a number of times by cardiology for  chest pain and there is not recently been a concern for acute coronary syndrome.  She is currently denying any pain and just reports the shortness of breath that she says has been gradually getting worse over the course of the day.  She also says that she is put on 3 pounds over the last couple of days.  Based on her physical exam and history, I suspect that her current presentation is more consistent with CHF exacerbation.  Rapid Covid antigen test is pending but the patient has no other signs or symptoms at this time.  Broad laboratory evaluation is pending.  I ordered an ethanol level because the last time I saw this patient, which was 4 months ago, she had an elevated ethanol level which was possibly contributing to her symptoms at the time.      Clinical Course as of Nov 17 104  Tue Nov 18, 2019  0022 Decreased PCO2 which is not consistent with COPD exacerbation and likely is due to her tachypnea.  This further suggests CHF as the cause of her acute respiratory failure rather than COPD, or completely unrelated alternative diagnosis  pCO2, Ven(!): 41 [CF]  0023 CBC notable only for a mild cytosis of 12.3.   [CF]  0039 Findings most consistent with pulmonary vascular congestion/CHF exacerbation.  I will begin treatment with furosemide 40 mg IV and the patient will remain on BiPAP.  No evidence of pneumonia at this time.  DG Chest Port 1 View [CF]  [phone] Alcohol, Ethyl (B): <10 [CF]  0042 The patient is noted to have a lactate>4. With the current information available to me, I don't think the patient is in septic shock. The lactate>4, is related to respiratory distress/ respiratory failure.  Awaiting procalcitonin for additional evidence that this is not an infectious process.  Lactic Acid, Venous(!!): 4.8 [CF]  0045 Temp: 98.4 F (36.9 C) [CF]  0045 Tolerating Bipap well, speaking in full sentences, improved from initial presentation.   [CF]  0045 Generally reassuring comprehensive metabolic  panel without any gross abnormalities, anticipated abnormal values based on her presentation and comorbidities.  Comprehensive metabolic panel(!) [CF]  9675 Likely secondary to mild demand ischemia, no chest pain or EKG changes, no concern for ACS.  Troponin I (High Sensitivity)(!): 23 [CF]  0047 Consulted hospitalist for admission   [CF]  0054 Substantially elevated BNP  B Natriuretic Peptide(!): 4,403.0 [CF]  0055 The patient is reporting back pain from her tripoding position and chronic issues.  I will give her a small dose of morphine 2 mg IV but do not want to provide too much sedation which could decrease her respiratory status.  I am also giving Zofran 4 mg IV given that she is on BiPAP and provoking vomiting would be dangerous.   [CF]  0105 Discussed by phone with Dr. Damita Dunnings who will admit.   [CF]    Clinical Course User Index [CF] Hinda Kehr, MD     ____________________________________________  FINAL CLINICAL IMPRESSION(S) / ED DIAGNOSES  Final diagnoses:  Acute respiratory failure with hypoxemia ([place])  Acute on chronic congestive heart failure, unspecified heart failure type ([place])     MEDICATIONS GIVEN DURING THIS VISIT:  Medications  morphine 2 MG/ML  injection 2 mg (has no administration in time range)  ondansetron (ZOFRAN) injection 4 mg (has no administration in time range)  furosemide (LASIX) injection 40 mg (40 mg Intravenous Given 11/18/19 0048)     ED Discharge Orders    None      *Please note:  SAHIAN KERNEY was evaluated in Emergency Department on 11/18/2019 for the symptoms described in the history of present illness. She was evaluated in the context of the global COVID-19 pandemic, which necessitated consideration that the patient might be at risk for infection with the SARS-CoV-2 virus that causes COVID-19. Institutional protocols and algorithms that pertain to the evaluation of patients at risk for COVID-19 are in a state of rapid change based on  information released by regulatory bodies including the CDC and federal and state organizations. These policies and algorithms were followed during the patient's care in the ED.  Some ED evaluations and interventions may be delayed as a result of limited staffing during the pandemic.*  Note:  This document was prepared using Dragon voice recognition software and may include unintentional dictation errors.   Hinda Kehr, MD 11/18/19 [phone]

## 2019-11-18 NOTE — ED Notes (Signed)
ED TO INPATIENT HANDOFF REPORT  ED Nurse Name and Phone #: Willene Hatchet Name/Age/Gender Ashley [name] 69 y.o. female Room/Bed: ED12A/ED12A  Code Status   Code Status: Full Code  Home/SNF/Other Home Patient oriented to: self, place, time and situation Is this baseline? Yes   Triage Complete: Triage complete  Chief Complaint Acute respiratory failure with hypoxia ([place]) [J96.01]  Triage Note Pt arrived from home via [facility] for shortness of breath.  Hx of CHF, COPD.  Pt denies hx of diabetes.    EMS provided vitals: 79% room air, 95% on non-rebreather labored, 96% on CPAP pk 7.5 with decreased WOB BP 125/96 HR 120 RR 20-30 CBG 399   EMS gave 125 solumedrol    Allergies Allergies  Allergen Reactions  . Vancomycin Nausea And Vomiting and Palpitations  . Hydrocodone-Acetaminophen Nausea Only    Level of Care/Admitting Diagnosis ED Disposition    ED Disposition Condition [facility]: [place] [[id number]]  Level of Care: Telemetry [5]  Covid Evaluation: Confirmed COVID Negative  Date Laboratory Confirmed COVID Negative: 11/18/2019  Diagnosis: Acute respiratory failure with hypoxia [facility]) [[id number]]  Admitting Physician: Athena Masse [[id number]]  Attending Physician: Athena Masse [[id number]]  Estimated length of stay: 3 - 4 days  Certification:: I certify this patient will need inpatient services for at least 2 midnights       B Medical/Surgery History Past Medical History:  Diagnosis Date  . AICD (automatic cardioverter/defibrillator) present    on right side  . Anemia   . CAD (coronary artery disease)    s/p CABG  . CHF (congestive heart failure) ([place])   . Chronic kidney disease    renal artery stenosis  . Colon cancer ([place])   . Depression   . GERD (gastroesophageal reflux disease)   . Hx of colonic polyps   . Hyperlipidemia   . Hypertension   . Mitral valve disorder    s/p mitral valve repair wth CABG  .  Myocardial infarction ([place])   . Peripheral vascular disease ([place])   . Presence of permanent cardiac pacemaker    Pacemaker/ Defibrillator  . Tobacco abuse 10/11/2014   Past Surgical History:  Procedure Laterality Date  . arm surgery     fracture, has plates and screws  . CABG with mitral valve repair    . CARDIAC CATHETERIZATION    . COLON SURGERY     colon cancer  . COLONOSCOPY WITH PROPOFOL N/A 10/09/2016   Procedure: COLONOSCOPY WITH PROPOFOL;  Surgeon: Jonathon Bellows, MD;  Location: [facility] ENDOSCOPY;  Service: Endoscopy;  Laterality: N/A;  . COLONOSCOPY WITH PROPOFOL N/A 07/24/2018   Procedure: COLONOSCOPY WITH PROPOFOL;  Surgeon: Virgel Manifold, MD;  Location: [facility] ENDOSCOPY;  Service: Endoscopy;  Laterality: N/A;  . CORONARY ARTERY BYPASS GRAFT    . ENTEROSCOPY N/A 11/21/2018   Procedure: ENTEROSCOPY-BALLOON;  Surgeon: Jonathon Bellows, MD;  Location[facility] ENDOSCOPY;  Service: Gastroenterology;  Laterality: N/A;  . ESOPHAGOGASTRODUODENOSCOPY (EGD) WITH PROPOFOL N/A 07/24/2018   Procedure: ESOPHAGOGASTRODUODENOSCOPY (EGD) WITH PROPOFOL;  Surgeon: Virgel Manifold, MD;  Location: [facility] ENDOSCOPY;  Service: Endoscopy;  Laterality: N/A;  . GIVENS CAPSULE STUDY N/A 08/16/2018   Procedure: GIVENS CAPSULE STUDY;  Surgeon: Virgel Manifold, MD;  Location: [facility] ENDOSCOPY;  Service: Endoscopy;  Laterality: N/A;  . pace maker defib  2016  . PERIPHERAL VASCULAR CATHETERIZATION N/A 10/23/2016   Procedure: Renal Angiography;  Surgeon: Algernon Huxley, MD;  Location: [place]  CV LAB;  Service: Cardiovascular;  Laterality: N/A;  . RIGHT/LEFT HEART CATH AND CORONARY ANGIOGRAPHY N/A 08/26/2019   Procedure: RIGHT/LEFT HEART CATH AND CORONARY ANGIOGRAPHY;  Surgeon: Isaias Cowman, MD;  Location: [place] CV LAB;  Service: Cardiovascular;  Laterality: N/A;  . TOTAL ABDOMINAL HYSTERECTOMY  1999   history of abnormal pap     A IV Location/Drains/Wounds Patient Lines/Drains/Airways Status    Active Line/Drains/Airways    Name:   Placement date:   Placement time:   Site:   Days:   Peripheral IV 11/18/19 Left;Posterior Hand   11/18/19    0019    Hand   less than 1   External Urinary Catheter   11/18/19    0020    -   less than 1          Intake/Output Last 24 hours  Intake/Output Summary (Last 24 hours) at 11/18/2019 2212 Last data filed at 11/18/2019 0522 Gross per 24 hour  Intake -  Output 450 ml  Net -450 ml    Labs/Imaging Results for orders placed or performed during the hospital encounter of 11/18/19 (from the past 48 hour(s))  CBC with Differential     Status: Abnormal   Collection Time: 11/18/19 12:05 AM  Result Value Ref Range   WBC 12.3 (H) 4.0 - 10.5 K/uL   RBC 4.19 3.87 - 5.11 MIL/uL   Hemoglobin 14.0 12.0 - 15.0 g/dL   HCT 40.3 36.0 - 46.0 %   MCV 96.2 80.0 - 100.0 fL   MCH 33.4 26.0 - 34.0 pg   MCHC 34.7 30.0 - 36.0 g/dL   RDW 14.8 11.5 - 15.5 %   Platelets 293 150 - 400 K/uL   nRBC 0.0 0.0 - 0.2 %   Neutrophils Relative % 67 %   Neutro Abs 8.3 (H) 1.7 - 7.7 K/uL   Lymphocytes Relative 19 %   Lymphs Abs 2.4 0.7 - 4.0 K/uL   Monocytes Relative 9 %   Monocytes Absolute 1.1 (H) 0.1 - 1.0 K/uL   Eosinophils Relative 3 %   Eosinophils Absolute 0.3 0.0 - 0.5 K/uL   Basophils Relative 1 %   Basophils Absolute 0.2 (H) 0.0 - 0.1 K/uL   Immature Granulocytes 1 %   Abs Immature Granulocytes 0.07 0.00 - 0.07 K/uL    Comment: Performed at [facility], [address] [place], [place] [address]  Brain natriuretic peptide     Status: Abnormal   Collection Time: 11/18/19 12:05 AM  Result Value Ref Range   B Natriuretic Peptide 4,403.0 (H) 0.0 - 100.0 pg/mL    Comment: Performed at [facility], [address] [place], [place] [address]  Comprehensive metabolic panel     Status: Abnormal   Collection Time: 11/18/19 12:05 AM  Result Value Ref Range   Sodium 134 (L) 135 - 145 mmol/L   Potassium 3.7 3.5 - 5.1 mmol/L   Chloride 98 98  - 111 mmol/L   CO2 20 (L) 22 - 32 mmol/L   Glucose, Bld 382 (H) 70 - 99 mg/dL   BUN 21 8 - 23 mg/dL   Creatinine, Ser 1.28 (H) 0.44 - 1.00 mg/dL   Calcium 8.8 (L) 8.9 - 10.3 mg/dL   Total Protein 7.1 6.5 - 8.1 g/dL   Albumin 4.1 3.5 - 5.0 g/dL   AST 22 15 - 41 U/L   ALT 16 0 - 44 U/L   Alkaline Phosphatase 76 38 - 126 U/L   Total  Bilirubin 1.5 (H) 0.3 - 1.2 mg/dL   GFR calc non Af Amer 44 (L) >60 mL/min   GFR calc Af Amer 50 (L) >60 mL/min   Anion gap 16 (H) 5 - 15    Comment: Performed at [facility], [place], [place] [address]  Troponin I (High Sensitivity)     Status: Abnormal   Collection Time: 11/18/19 12:05 AM  Result Value Ref Range   Troponin I (High Sensitivity) 23 (H) <18 ng/L    Comment: (NOTE) Elevated high sensitivity troponin I (hsTnI) values and significant  changes across serial measurements may suggest ACS but many other  chronic and acute conditions are known to elevate hsTnI results.  Refer to the "Links" section for chest pain algorithms and additional  guidance. Performed at [facility], [address] [place], [place] [address]   Lactic acid, plasma     Status: Abnormal   Collection Time: 11/18/19 12:05 AM  Result Value Ref Range   Lactic Acid, Venous 4.8 (HH) 0.5 - 1.9 mmol/L    Comment: CRITICAL RESULT CALLED TO, READ BACK BY AND VERIFIED WITH REBEKAH BAXTER AT 0041 ON 11/18/19 RWW Performed at [facility], [address] [place], [place] [address]   Ethanol     Status: None   Collection Time: 11/18/19 12:05 AM  Result Value Ref Range   Alcohol, Ethyl (B) <10 <10 mg/dL    Comment: (NOTE) Lowest detectable limit for serum alcohol is 10 mg/dL. For medical purposes only. Performed at [facility], [address] [place], [place] [address]   Magnesium     Status: None   Collection Time: 11/18/19 12:05 AM  Result Value Ref Range   Magnesium 1.7 1.7 - 2.4 mg/dL    Comment: Performed at  [facility], [address] [place],  [address]  Respiratory Panel by RT PCR (Flu A&B, Covid) - Nasopharyngeal Swab     Status: None   Collection Time: 11/18/19 12:05 AM   Specimen: Nasopharyngeal Swab  Result Value Ref Range   SARS Coronavirus 2 by RT PCR NEGATIVE NEGATIVE    Comment: (NOTE) SARS-CoV-2 target nucleic acids are NOT DETECTED. The SARS-CoV-2 RNA is generally detectable in upper respiratoy specimens during the acute phase of infection. The lowest concentration of SARS-CoV-2 viral copies this assay can detect is 131 copies/mL. A negative result does not preclude SARS-Cov-2 infection and should not be used as the sole basis for treatment or other patient management decisions. A negative result may occur with  improper specimen collection/handling, submission of specimen other than nasopharyngeal swab, presence of viral mutation(s) within the areas targeted by this assay, and inadequate number of viral copies (<131 copies/mL). A negative result must be combined with clinical observations, patient history, and epidemiological information. The expected result is Negative. Fact Sheet for Patients:  PinkCheek.be Fact Sheet for Healthcare Providers:  GravelBags.it This test is not yet ap proved or cleared by the [place] FDA and  has been authorized for detection and/or diagnosis of SARS-CoV-2 by FDA under an Emergency Use Authorization (EUA). This EUA will remain  in effect (meaning this test can be used) for the duration of the COVID-19 declaration under Section 564(b)(1) of the Act, 21 U.S.C. section 360bbb-3(b)(1), unless the authorization is terminated or revoked sooner.    Influenza A by PCR NEGATIVE NEGATIVE   Influenza B by PCR NEGATIVE NEGATIVE    Comment: (NOTE) The Xpert Xpress SARS-CoV-2/FLU/RSV assay is intended as  an aid in  the diagnosis of influenza from Nasopharyngeal swab  specimens and  should not be used as a sole basis for treatment. Nasal washings and  aspirates are unacceptable for Xpert Xpress SARS-CoV-2/FLU/RSV  testing. Fact Sheet for Patients: PinkCheek.be Fact Sheet for Healthcare Providers: GravelBags.it This test is not yet approved or cleared by the [place] FDA and  has been authorized for detection and/or diagnosis of SARS-CoV-2 by  FDA under an Emergency Use Authorization (EUA). This EUA will remain  in effect (meaning this test can be used) for the duration of the  Covid-19 declaration under Section 564(b)(1) of the Act, 21  U.S.C. section 360bbb-3(b)(1), unless the authorization is  terminated or revoked. Performed at [facility], [address] [place], [place] [address]   Procalcitonin     Status: None   Collection Time: 11/18/19 12:05 AM  Result Value Ref Range   Procalcitonin <0.10 ng/mL    Comment:        Interpretation: PCT (Procalcitonin) <= 0.5 ng/mL: Systemic infection (sepsis) is not likely. Local bacterial infection is possible. (NOTE)       Sepsis PCT Algorithm           Lower Respiratory Tract                                      Infection PCT Algorithm    ----------------------------     ----------------------------         PCT < 0.25 ng/mL                PCT < 0.10 ng/mL         Strongly encourage             Strongly discourage   discontinuation of antibiotics    initiation of antibiotics    ----------------------------     -----------------------------       PCT 0.25 - 0.50 ng/mL            PCT 0.10 - 0.25 ng/mL               OR       >80% decrease in PCT            Discourage initiation of                                            antibiotics      Encourage discontinuation           of antibiotics    ----------------------------     -----------------------------         PCT >= 0.50 ng/mL              PCT 0.26 - 0.50 ng/mL                AND        <80% decrease in PCT             Encourage initiation of                                             antibiotics       Encourage continuation  of antibiotics    ----------------------------     -----------------------------        PCT >= 0.50 ng/mL                  PCT > 0.50 ng/mL               AND         increase in PCT                  Strongly encourage                                      initiation of antibiotics    Strongly encourage escalation           of antibiotics                                     -----------------------------                                           PCT <= 0.25 ng/mL                                                 OR                                        > 80% decrease in PCT                                     Discontinue / Do not initiate                                             antibiotics Performed at [facility], [address] [place], [place] [address]   Procalcitonin     Status: None   Collection Time: 11/18/19 12:05 AM  Result Value Ref Range   Procalcitonin <0.10 ng/mL    Comment:        Interpretation: PCT (Procalcitonin) <= 0.5 ng/mL: Systemic infection (sepsis) is not likely. Local bacterial infection is possible. (NOTE)       Sepsis PCT Algorithm           Lower Respiratory Tract                                      Infection PCT Algorithm    ----------------------------     ----------------------------         PCT < 0.25 ng/mL                PCT < 0.10 ng/mL         Strongly encourage             Strongly  discourage   discontinuation of antibiotics    initiation of antibiotics    ----------------------------     -----------------------------       PCT 0.25 - 0.50 ng/mL            PCT 0.10 - 0.25 ng/mL               OR       >80% decrease in PCT            Discourage initiation of                                            antibiotics      Encourage discontinuation           of  antibiotics    ----------------------------     -----------------------------         PCT >= 0.50 ng/mL              PCT 0.26 - 0.50 ng/mL               AND        <80% decrease in PCT             Encourage initiation of                                             antibiotics       Encourage continuation           of antibiotics    ----------------------------     -----------------------------        PCT >= 0.50 ng/mL                  PCT > 0.50 ng/mL               AND         increase in PCT                  Strongly encourage                                      initiation of antibiotics    Strongly encourage escalation           of antibiotics                                     -----------------------------                                           PCT <= 0.25 ng/mL                                                 OR                                        >  80% decrease in PCT                                     Discontinue / Do not initiate                                             antibiotics Performed at [facility], [address] [place], [place] [address]   Blood gas, venous     Status: Abnormal   Collection Time: 11/18/19 12:12 AM  Result Value Ref Range   pH, Ven 7.35 7.250 - 7.430   pCO2, Ven 41 (L) 44.0 - 60.0 mmHg   pO2, Ven 73.0 (H) 32.0 - 45.0 mmHg   Bicarbonate 22.6 20.0 - 28.0 mmol/L   Acid-base deficit 2.9 (H) 0.0 - 2.0 mmol/L   O2 Saturation 93.6 %   Patient temperature 37.0    Collection site VENOUS    Sample type VENOUS     Comment: Performed at [facility], [address] [place], [place] [address]  POC SARS Coronavirus 2 Ag     Status: None   Collection Time: 11/18/19 12:22 AM  Result Value Ref Range   SARS Coronavirus 2 Ag NEGATIVE NEGATIVE    Comment: (NOTE) SARS-CoV-2 antigen NOT DETECTED.  Negative results are presumptive.  Negative results do not preclude SARS-CoV-2 infection and should not be used as the sole basis  for treatment or other patient management decisions, including infection  control decisions, particularly in the presence of clinical signs and  symptoms consistent with COVID-19, or in those who have been in contact with the virus.  Negative results must be combined with clinical observations, patient history, and epidemiological information. The expected result is Negative. Fact Sheet for Patients: PodPark.tn Fact Sheet for Healthcare Providers: GiftContent.is This test is not yet approved or cleared by the [place] FDA and  has been authorized for detection and/or diagnosis of SARS-CoV-2 by FDA under an Emergency Use Authorization (EUA).  This EUA will remain in effect (meaning this test can be used) for the duration of  the COVID-19 de claration under Section 564(b)(1) of the Act, 21 U.S.C. section 360bbb-3(b)(1), unless the authorization is terminated or revoked sooner.   Lactic acid, plasma     Status: Abnormal   Collection Time: 11/18/19  2:44 AM  Result Value Ref Range   Lactic Acid, Venous 3.3 (HH) 0.5 - 1.9 mmol/L    Comment: CRITICAL VALUE NOTED. VALUE IS CONSISTENT WITH PREVIOUSLY REPORTED/CALLED VALUE RWW Performed at [facility], [address] [place], [place] [address]   Urinalysis, Routine w reflex microscopic     Status: Abnormal   Collection Time: 11/18/19  2:44 AM  Result Value Ref Range   Color, Urine STRAW (A) YELLOW   APPearance CLEAR (A) CLEAR   Specific Gravity, Urine 1.008 1.005 - 1.030   pH 6.0 5.0 - 8.0   Glucose, UA 50 (A) NEGATIVE mg/dL   Hgb urine dipstick SMALL (A) NEGATIVE   Bilirubin Urine NEGATIVE NEGATIVE   Ketones, ur NEGATIVE NEGATIVE mg/dL   Protein, ur NEGATIVE NEGATIVE mg/dL   Nitrite NEGATIVE NEGATIVE   Leukocytes,Ua NEGATIVE NEGATIVE   RBC / HPF 0-5 0 - 5 RBC/hpf   WBC, UA 0-5 0 - 5 WBC/hpf   Bacteria, UA NONE  SEEN NONE SEEN   Squamous Epithelial / LPF 0-5 0  - 5    Comment: Performed at [facility], [address] [place], [place] [address]  CBC     Status: Abnormal   Collection Time: 11/18/19  2:44 AM  Result Value Ref Range   WBC 12.3 (H) 4.0 - 10.5 K/uL   RBC 3.90 3.87 - 5.11 MIL/uL   Hemoglobin 13.0 12.0 - 15.0 g/dL   HCT 37.2 36.0 - 46.0 %   MCV 95.4 80.0 - 100.0 fL   MCH 33.3 26.0 - 34.0 pg   MCHC 34.9 30.0 - 36.0 g/dL   RDW 14.9 11.5 - 15.5 %   Platelets 205 150 - 400 K/uL   nRBC 0.0 0.0 - 0.2 %    Comment: Performed at [facility], [place], [place] [address]  Troponin I (High Sensitivity)     Status: Abnormal   Collection Time: 11/18/19  2:44 AM  Result Value Ref Range   Troponin I (High Sensitivity) 20 (H) <18 ng/L    Comment: (NOTE) Elevated high sensitivity troponin I (hsTnI) values and significant  changes across serial measurements may suggest ACS but many other  chronic and acute conditions are known to elevate hsTnI results.  Refer to the "Links" section for chest pain algorithms and additional  guidance. Performed at [facility], [address] [place], [place] [address]    DG Chest Amo 1 View  Result Date: 11/18/2019 CLINICAL DATA:  Shortness of breath EXAM: PORTABLE CHEST 1 VIEW COMPARISON:  October 15, 2019 FINDINGS: The heart size and mediastinal contours are unchanged. Again noted is a right-sided pacemaker with stable lead tips. Overlying median sternotomy wires. There is diffusely increased interstitial markings seen throughout both lungs. There is prominent pulmonary vasculature. No acute osseous abnormality. The patient is status post plate fixation of the distal left clavicle. IMPRESSION: Findings suggestive of interstitial edema and pulmonary vascular congestion. Stable cardiomegaly Electronically Signed   By: Prudencio Pair M.D.   On: 11/18/2019 00:33   ECHOCARDIOGRAM COMPLETE  Result Date: 11/18/2019   ECHOCARDIOGRAM REPORT   Patient Name:   Ashley [name]  [facility] Date of Exam: 11/18/2019 Medical Rec #:  [id number]         Height:       63.0 in Accession #:    [id number]        Weight:       166.0 lb Date of Birth:  May 14, 1951         BSA:          1.79 m Patient Age:    37 years          BP:           101/55 mmHg Patient Gender: F                 HR:           69 bpm. Exam Location:  [facility] Procedure: 2D Echo, Color Doppler and Cardiac Doppler Indications:     CHF- acute diastolic 419.62  History:         Patient has prior history of Echocardiogram examinations, most                  recent 05/27/2019. Pacemaker; Risk Factors:Hypertension. MI,                  Mitral valve disorder.  Sonographer:     Sherrie Sport RDCS (AE)  Referring Phys:  [id number] Athena Masse Diagnosing Phys: Nelva Bush MD IMPRESSIONS  1. Left ventricular ejection fraction, by visual estimation, is 30 to 35%. The left ventricle has severely decreased function. There is severely increased left ventricular hypertrophy.  2. Left ventricular diastolic parameters are indeterminate.  3. The left ventricle demonstrates global hypokinesis.  4. Global right ventricle has moderately reduced systolic function.The right ventricular size is normal. Mildly increased right ventricular wall thickness.  5. Left atrial size was mild-moderately dilated.  6. Right atrial size was normal.  7. Presence of pericardial fat pad.  8. The mitral valve has been repaired/replaced. There is at least moderate mitral valve regurgitation. Moderate-severe mitral stenosis.  9. The tricuspid valve is not well visualized. 10. The aortic valve was not well visualized. Aortic valve regurgitation is not visualized. Mild to moderate aortic valve sclerosis/calcification without any evidence of aortic stenosis. 11. The pulmonic valve was not well visualized. Pulmonic valve regurgitation is not visualized. 12. TR signal is inadequate for assessing pulmonary artery systolic pressure. 13. A pacer wire is visualized in the RV. 14. MV peak  gradient, 23.8 mmHg. 15. The interatrial septum was not well visualized. FINDINGS  Left Ventricle: Left ventricular ejection fraction, by visual estimation, is 30 to 35%. The left ventricle has severely decreased function. The left ventricle demonstrates global hypokinesis. The left ventricular internal cavity size was the left ventricle is normal in size. There is severely increased left ventricular hypertrophy. Left ventricular diastolic parameters are indeterminate. Right Ventricle: The right ventricular size is normal. Mildly increased right ventricular wall thickness. Global RV systolic function is has moderately reduced systolic function. Left Atrium: Left atrial size was mild-moderately dilated. Right Atrium: Right atrial size was normal in size Pericardium: There is no evidence of pericardial effusion. Presence of pericardial fat pad. Mitral Valve: The mitral valve has been repaired/replaced. There is at least moderate mitral valve regurgitation. Moderate-severe mitral valve stenosis by observation. MV peak gradient, 23.8 mmHg. Tricuspid Valve: The tricuspid valve is not well visualized. Tricuspid valve regurgitation is trivial. Aortic Valve: The aortic valve was not well visualized. . There is moderate thickening and moderate calcification of the aortic valve. Aortic valve regurgitation is not visualized. Mild to moderate aortic valve sclerosis/calcification is present, without  any evidence of aortic stenosis. There is moderate thickening of the aortic valve. There is moderate calcification of the aortic valve. Aortic valve mean gradient measures 3.7 mmHg. Aortic valve peak gradient measures 6.0 mmHg. Aortic valve area, by VTI  measures 2.14 cm. Pulmonic Valve: The pulmonic valve was not well visualized. Pulmonic valve regurgitation is not visualized. Pulmonic regurgitation is not visualized. No evidence of pulmonic stenosis. Aorta: The aortic root is normal in size and structure. Pulmonary Artery: The  pulmonary artery is not well seen. Venous: The inferior vena cava was not well visualized. IAS/Shunts: The interatrial septum was not well visualized. Additional Comments: A pacer wire is visualized in the right ventricle.  LEFT VENTRICLE PLAX 2D LVIDd:         4.17 cm  Diastology LVIDs:         2.86 cm  LV e' lateral:   2.72 cm/s LV PW:         1.90 cm  LV E/e' lateral: 59.9 LV IVS:        1.40 cm  LV e' medial:    2.94 cm/s LVOT diam:     2.10 cm  LV E/e' medial:  55.4 LV SV:  46 ml LV SV Index:   24.88 LVOT Area:     3.46 cm  RIGHT VENTRICLE RV Basal diam:  3.51 cm RV S prime:     7.94 cm/s LEFT ATRIUM             Index       RIGHT ATRIUM           Index LA diam:        5.90 cm 3.30 cm/m  RA Area:     11.00 cm LA Vol (A2C):   73.5 ml 41.14 ml/m RA Volume:   22.00 ml  12.32 ml/m LA Vol (A4C):   88.3 ml 49.43 ml/m LA Biplane Vol: 82.6 ml 46.24 ml/m  AORTIC VALVE                   PULMONIC VALVE AV Area (Vmax):    1.55 cm    PV Vmax:        0.65 m/s AV Area (Vmean):   1.42 cm    PV Peak grad:   1.7 mmHg AV Area (VTI):     2.14 cm    RVOT Peak grad: 3 mmHg AV Vmax:           122.00 cm/s AV Vmean:          86.967 cm/s AV VTI:            0.241 m AV Peak Grad:      6.0 mmHg AV Mean Grad:      3.7 mmHg LVOT Vmax:         54.70 cm/s LVOT Vmean:        35.600 cm/s LVOT VTI:          0.149 m LVOT/AV VTI ratio: 0.62  AORTA Ao Root diam: 3.30 cm MITRAL VALVE MV Area (PHT): 2.36 cm              SHUNTS MV Peak grad:  23.8 mmHg             Systemic VTI:  0.15 m MV Mean grad:  10.0 mmHg             Systemic Diam: 2.10 cm MV Vmax:       2.44 m/s MV Vmean:      145.0 cm/s MV VTI:        0.61 m MV PHT:        93.38 msec MV Decel Time: 322 msec MV E velocity: 163.00 cm/s 103 cm/s MV A velocity: 99.40 cm/s  70.3 cm/s MV E/A ratio:  1.64        1.5  Harrell Gave End MD Electronically signed by Nelva Bush MD Signature Date/Time: 11/18/2019/4:13:04 PM    Final     Pending Labs Unresulted Labs (From admission,  onward)    Start     Ordered   11/25/19 0500  Creatinine, serum  (enoxaparin (LOVENOX)    CrCl >/= 30 ml/min)  Weekly,   STAT    Comments: while on enoxaparin therapy    11/18/19 0112   11/19/19 6433  Basic metabolic panel  Daily,   STAT     11/18/19 0112          Vitals/Pain Today's Vitals   11/18/19 1600 11/18/19 1900 11/18/19 2130 11/18/19 2200  BP: (!) 105/53 125/71 124/71 113/62  Pulse: 74 80 85 85  Resp: (!) 26 14 18  (!) 22  Temp:      TempSrc:  SpO2: 98% 97% 96% 97%  Weight:      Height:      PainSc:        Isolation Precautions No active isolations  Medications Medications  carvedilol (COREG) tablet 6.25 mg (6.25 mg Oral Not Given 11/18/19 1625)  nitroGLYCERIN (NITROSTAT) SL tablet 0.4 mg (has no administration in time range)  spironolactone (ALDACTONE) tablet 12.5 mg (12.5 mg Oral Given 11/18/19 0854)  clopidogrel (PLAVIX) tablet 75 mg (75 mg Oral Given 11/18/19 0854)  pantoprazole (PROTONIX) EC tablet 40 mg (40 mg Oral Given 11/18/19 0855)  ipratropium-albuterol (DUONEB) 0.5-2.5 (3) MG/3ML nebulizer solution 3 mL (has no administration in time range)  sodium chloride flush (NS) 0.9 % injection 3 mL (3 mLs Intravenous Refused 11/18/19 1113)  sodium chloride flush (NS) 0.9 % injection 3 mL (has no administration in time range)  0.9 %  sodium chloride infusion (has no administration in time range)  acetaminophen (TYLENOL) tablet 650 mg (has no administration in time range)  ondansetron (ZOFRAN) injection 4 mg (has no administration in time range)  enoxaparin (LOVENOX) injection 40 mg (40 mg Subcutaneous Given 11/18/19 0252)  fluticasone furoate-vilanterol (BREO ELLIPTA) 100-25 MCG/INH 1 puff (1 puff Inhalation Given 11/18/19 1245)    And  umeclidinium bromide (INCRUSE ELLIPTA) 62.5 MCG/INH 1 puff (1 puff Inhalation Given 11/18/19 1245)  furosemide (LASIX) tablet 40 mg (has no administration in time range)  furosemide (LASIX) injection 40 mg (40 mg Intravenous Given  11/18/19 0048)  morphine 2 MG/ML injection 2 mg (2 mg Intravenous Given 11/18/19 0107)  ondansetron (ZOFRAN) injection 4 mg (4 mg Intravenous Given 11/18/19 0107)    Mobility walks Low fall risk   Focused Assessments Pulmonary Assessment Handoff:  Lung sounds:   O2 Device: Nasal Cannula O2 Flow Rate (L/min): 4 L/min      R Recommendations: See Admitting Provider Note  Report given to:   Additional Notes:

## 2019-11-18 NOTE — ED Notes (Signed)
Pt sleeping, bi-pap on, no distress at this time.

## 2019-11-18 NOTE — TOC Initial Note (Signed)
Transition of Care [facility]) - Initial/Assessment Note    Patient Details  Name: Ashley [name] MRN: [id number] Date of Birth: 1951/03/06  [facility]) CM/SW Contact:    Shelbie Ammons, RN Phone Number: 11/18/2019, 3:46 PM  Clinical Narrative:    RNCM assessed patient at bedside. Patient is alert and verbally responsive on arrival sitting up in bed watching tv. Patient reports to feeling much better this morning than she did last evening. Patient reports that she lives independently in a single level home with her boyfriend and his brother. Patient reports that she does still drive but does not drive much at night anymore. She reports that her boyfriend can assist in getting her to appointments when need be. Patient reports she has Dr. Derrel Nip for a PCP but has been unable to see pulmonology due to timing of appointments and them being scheduled so far out. Did discuss skilled nursing facility however at this time patient reports it is her plan to return home.  1.  Do you have a scale? Yes 2.  Do you weigh yourself daily? Yes 3.  Two pounds over night or 5 in one week call your doctor because it could be fluid overload? Yes 4.  Do you go to the Heart Failure Clinic? No 5. An appointment with heart failure clinic will be made before discharge if. 6. If patient is home bound, home health can be arranged with an agency that provides a heart failure protocol.  RNCM will continue to follow for any other needs.                  Expected Discharge Plan: Home/Self Care     Patient Goals and CMS Choice Patient states their goals for this hospitalization and ongoing recovery are:: To get straightened out so I can take care of myself.      Expected Discharge Plan and Services Expected Discharge Plan: Home/Self Care   Discharge Planning Services: CM Consult   Living arrangements for the past 2 months: Single Family Home                                      Prior Living  Arrangements/Services Living arrangements for the past 2 months: Single Family Home Lives with:: Significant Other, Other (Comment)(brother of significant other) Patient language and need for interpreter reviewed:: Yes Do you feel safe going back to the place where you live?: Yes      Need for Family Participation in Patient Care: Yes (Comment) Care giver support system in place?: Yes (comment)   Criminal Activity/Legal Involvement Pertinent to Current Situation/Hospitalization: No - Comment as needed  Activities of Daily Living      Permission Sought/Granted                  Emotional Assessment Appearance:: Appears stated age Attitude/Demeanor/Rapport: Engaged Affect (typically observed): Appropriate Orientation: : Oriented to Self, Oriented to Place, Oriented to  Time, Oriented to Situation   Psych Involvement: No (comment)  Admission diagnosis:  Acute respiratory failure with hypoxia ([place]) [J96.01] Patient Active Problem List   Diagnosis Date Noted  . Acute respiratory failure with hypoxia ([place]) 11/18/2019  . Mitral stenosis with insufficiency   . Hypertensive urgency   . Chronic renal failure, stage 3a   . Depression   . Respiratory failure with hypoxia ([place]) 09/22/2019  . Hyperglycemia 09/22/2019  . Presence of permanent  cardiac pacemaker   . Anemia   . Chronic kidney disease   . Hypertension   . Mitral valve disorder 08/25/2019  . Acute respiratory failure ([place]) 08/24/2019  . Acute CHF ([place]) 08/12/2019  . CHF (congestive heart failure) ([place]) 07/16/2019  . Cough with hemoptysis 06/09/2019  . Insomnia 05/07/2019  . Hypokalemia 03/23/2019  . Hyperglycemia, drug-induced 03/23/2019  . COVID-19 virus not detected 03/23/2019  . Respiratory failure ([place]) 03/16/2019  . AVM (arteriovenous malformation) of small bowel, acquired   . Anemia, iron deficiency 11/10/2018  . Acute on chronic systolic CHF (congestive heart failure) ([place]) 11/06/2018  . Rectal polyp   .  Benign neoplasm of cecum   . Barrett's esophagus without dysplasia   . Stomach irritation   . Chronic diastolic heart failure ([place]) 07/03/2018  . HTN (hypertension) 07/03/2018  . 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 B12 deficiency anemia 06/28/2018  . Hypotension 05/11/2018  . Prediabetes 05/11/2018  . Personal history of colon cancer   . Benign neoplasm of descending colon   . Polyp of sigmoid colon   . Benign neoplasm of transverse colon   . Diverticulosis of large intestine without diverticulitis   . 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 Renal artery stenosis ([place]) 10/03/2016  . 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 Hospital discharge follow-up 02/09/2015  . Cough in adult 10/21/2014  . Tobacco abuse counseling 10/11/2014  . Tobacco abuse 10/11/2014  . Chronic right hip pain 08/26/2014  . Atherosclerosis of native artery of extremity with intermittent claudication ([place]) 06/18/2013  . Preoperative evaluation to rule out surgical contraindication 06/18/2013  . CAD (coronary artery disease) 06/01/2013  . GERD (gastroesophageal reflux disease) 06/01/2013  . Hypercholesterolemia 06/01/2013  . Tubular adenoma of colon 06/01/2013  . Major depressive disorder in remission ([place]) 06/01/2013   PCP:  [name], MD Pharmacy:   [facility], [place], [place] [address] [place] [address] Phone: [phone] Fax: [place], [place] [address] [place] [address] Phone: [phone] Fax: [phone]     Social Determinants of Health ([place]) Interventions    Readmission Risk Interventions Readmission Risk Prevention Plan 11/18/2019 10/16/2019 09/23/2019  Transportation Screening Complete Complete Complete  PCP or Specialist Appt within 3-5 Days - - -  HRI or [place] - - -  Palliative Care Screening - - -  Medication Review (RN  Care Manager) Complete Complete -  PCP or Specialist appointment within 3-5 days of discharge Complete - Complete  [place]RI or [place] - Patient refused -  SW Recovery Care/Counseling Consult - Complete -  Palliative Care Screening Not Applicable Not Applicable Not [place] Not Applicable Not Applicable Not Applicable  Some recent data might be hidden

## 2019-11-18 NOTE — ED Notes (Signed)
Bipap removed for med admin. SpO2 maintained at 96% for the 5 min. Placed on 4L [place], SpO2 100%

## 2019-11-18 NOTE — ED Notes (Signed)
Patient is feeling comfortable on BiPAP at present, no complaints. Urinated using purewick and repositioned in bed. Resting

## 2019-11-19 ENCOUNTER — Encounter: Payer: Self-pay | Admitting: Internal Medicine

## 2019-11-19 ENCOUNTER — Ambulatory Visit: Payer: Self-pay | Admitting: *Deleted

## 2019-11-19 ENCOUNTER — Ambulatory Visit (INDEPENDENT_AMBULATORY_CARE_PROVIDER_SITE_OTHER): Payer: Medicare Other | Admitting: Internal Medicine

## 2019-11-19 DIAGNOSIS — Z09 Encounter for follow-up examination after completed treatment for conditions other than malignant neoplasm: Secondary | ICD-10-CM | POA: Diagnosis not present

## 2019-11-19 DIAGNOSIS — J439 Emphysema, unspecified: Secondary | ICD-10-CM | POA: Diagnosis not present

## 2019-11-19 DIAGNOSIS — J9601 Acute respiratory failure with hypoxia: Secondary | ICD-10-CM

## 2019-11-19 DIAGNOSIS — M109 Gout, unspecified: Secondary | ICD-10-CM

## 2019-11-19 DIAGNOSIS — I5043 Acute on chronic combined systolic (congestive) and diastolic (congestive) heart failure: Secondary | ICD-10-CM

## 2019-11-19 LAB — CBC
HCT: 34.6 % — ABNORMAL LOW (ref 36.0–46.0)
Hemoglobin: 11.8 g/dL — ABNORMAL LOW (ref 12.0–15.0)
MCH: 33 pg (ref 26.0–34.0)
MCHC: 34.1 g/dL (ref 30.0–36.0)
MCV: 96.6 fL (ref 80.0–100.0)
Platelets: 216 10*3/uL (ref 150–400)
RBC: 3.58 MIL/uL — ABNORMAL LOW (ref 3.87–5.11)
RDW: 14.8 % (ref 11.5–15.5)
WBC: 13.5 10*3/uL — ABNORMAL HIGH (ref 4.0–10.5)
nRBC: 0 % (ref 0.0–0.2)

## 2019-11-19 LAB — BASIC METABOLIC PANEL
Anion gap: 13 (ref 5–15)
BUN: 30 mg/dL — ABNORMAL HIGH (ref 8–23)
CO2: 24 mmol/L (ref 22–32)
Calcium: 9.2 mg/dL (ref 8.9–10.3)
Chloride: 97 mmol/L — ABNORMAL LOW (ref 98–111)
Creatinine, Ser: 1.5 mg/dL — ABNORMAL HIGH (ref 0.44–1.00)
GFR calc Af Amer: 42 mL/min — ABNORMAL LOW (ref >60)
GFR calc non Af Amer: 36 mL/min — ABNORMAL LOW (ref >60)
Glucose, Bld: 200 mg/dL — ABNORMAL HIGH (ref 70–99)
Potassium: 3.7 mmol/L (ref 3.5–5.1)
Sodium: 134 mmol/L — ABNORMAL LOW (ref 135–145)

## 2019-11-19 LAB — URIC ACID: Uric Acid, Serum: 10.4 mg/dL — ABNORMAL HIGH (ref 2.5–7.1)

## 2019-11-19 MED ORDER — FUROSEMIDE 40 MG PO TABS: 40.0000 mg | ORAL_TABLET | Freq: Two times a day (BID) | ORAL | 1 refills | Status: DC

## 2019-11-19 MED ORDER — FEBUXOSTAT 40 MG PO TABS: 40.0000 mg | ORAL_TABLET | Freq: Every day | ORAL | 5 refills | Status: DC

## 2019-11-19 MED ORDER — IPRATROPIUM-ALBUTEROL 0.5-2.5 (3) MG/3ML IN SOLN: 3.0000 mL | Freq: Four times a day (QID) | RESPIRATORY_TRACT | 1 refills | Status: DC | PRN

## 2019-11-19 MED ORDER — PREDNISONE 10 MG PO TABS: ORAL_TABLET | ORAL | 0 refills | Status: DC

## 2019-11-19 MED ORDER — POLYETHYLENE GLYCOL 3350 17 G PO PACK
17.0000 g | PACK | Freq: Two times a day (BID) | ORAL | Status: DC | PRN
  Administered 2019-11-19: 17 g via ORAL
  Filled 2019-11-19: qty 1

## 2019-11-19 NOTE — Plan of Care (Signed)
  Problem: Education: Goal: Knowledge of General Education information will improve Description Including pain rating scale, medication(s)/side effects and non-pharmacologic comfort measures Outcome: Progressing   

## 2019-11-19 NOTE — Progress Notes (Signed)
Telephone Note  This visit type was conducted due to national recommendations for restrictions regarding the COVID-19 pandemic (e.g. social distancing).  This format is felt to be most appropriate for this patient at this time.  All issues noted in this document were discussed and addressed.  No physical exam was performed (except for noted visual exam findings with Video Visits).   I connected with@ on 11/19/19 at  2:30 PM EST by a telephone and verified that I am speaking with the correct person using two identifiers. Location patient: home Location provider: work or home office 9   69 yr old female with chronic systolic heart failure  Due to ischemic cardiomyopathy and severe mitral valve stenosis and insufficiency,  released from [facility] today after  Being admitted Jan 11 evening for acute on chronic heart failure .  ECHO DONE. EF 30 to 35% severe mitral stenosis with insufficiency,   REFERRED by Oneita Jolly to [place] [facility]  For discussion of valve replacement/repair .  She was sent home without a steroid taper and her losartan was held due to change in Cr.   She denies any shortness of breath or chest pain .  No lower extremity edema'.     The objective of today's visit is to discuss treatment of gout.  She has reportedly had a 2 yr history of gout affecting both feet with no prior follow up or mention of gout to me.  The gout affects the left foot more often  than right , most recent episode involved the  left foot,   Episodes have been Occurring every 2 to 3 months.  Last flare was last week, now resolved     Has been taking ibuprofen and tylenol as needed, but was told recently to avoid NSAIDS due to CKD.    Uric acid level was  checked during hosptialization and was  10. 4 today,  cr was 1.5 ( GFR  35 - 45 ml/min)   Lab Results  Component Value Date   HGBA1C 5.4 08/13/2019    ROS: See pertinent positives and negatives per HPI.  Past Medical History:   Diagnosis Date  . AICD (automatic cardioverter/defibrillator) present    on right side  . Anemia   . CAD (coronary artery disease)    s/p CABG  . CHF (congestive heart failure) ([place])   . Chronic kidney disease    renal artery stenosis  . Colon cancer ([place])   . Depression   . GERD (gastroesophageal reflux disease)   . Hx of colonic polyps   . Hyperlipidemia   . Hypertension   . Mitral valve disorder    s/p mitral valve repair wth CABG  . Myocardial infarction ([place])   . Peripheral vascular disease ([place])   . Presence of permanent cardiac pacemaker    Pacemaker/ Defibrillator  . Tobacco abuse 10/11/2014    Past Surgical History:  Procedure Laterality Date  . arm surgery     fracture, has plates and screws  . CABG with mitral valve repair    . CARDIAC CATHETERIZATION    . COLON SURGERY     colon cancer  . COLONOSCOPY WITH PROPOFOL N/A 10/09/2016   Procedure: COLONOSCOPY WITH PROPOFOL;  Surgeon: Jonathon Bellows, MD;  Location: [facility] ENDOSCOPY;  Service: Endoscopy;  Laterality: N/A;  . COLONOSCOPY WITH PROPOFOL N/A 07/24/2018   Procedure: COLONOSCOPY WITH PROPOFOL;  Surgeon: Virgel Manifold, MD;  Location: [facility] ENDOSCOPY;  Service: Endoscopy;  Laterality: N/A;  .  CORONARY ARTERY BYPASS GRAFT    . ENTEROSCOPY N/A 11/21/2018   Procedure: ENTEROSCOPY-BALLOON;  Surgeon: Jonathon Bellows, MD;  [facility] ENDOSCOPY;  Service: Gastroenterology;  Laterality: N/A;  . ESOPHAGOGASTRODUODENOSCOPY (EGD) WITH PROPOFOL N/A 07/24/2018   Procedure: ESOPHAGOGASTRODUODENOSCOPY (EGD) WITH PROPOFOL;  Surgeon: Virgel Manifold, MD;  Location: [facility] ENDOSCOPY;  Service: Endoscopy;  Laterality: N/A;  . GIVENS CAPSULE STUDY N/A 08/16/2018   Procedure: GIVENS CAPSULE STUDY;  Surgeon: Virgel Manifold, MD;  Location: [facility] ENDOSCOPY;  Service: Endoscopy;  Laterality: N/A;  . pace maker defib  2016  . PERIPHERAL VASCULAR CATHETERIZATION N/A 10/23/2016   Procedure: Renal Angiography;  Surgeon: Algernon Huxley,  MD;  Location: [place] CV LAB;  Service: Cardiovascular;  Laterality: N/A;  . RIGHT/LEFT HEART CATH AND CORONARY ANGIOGRAPHY N/A 08/26/2019   Procedure: RIGHT/LEFT HEART CATH AND CORONARY ANGIOGRAPHY;  Surgeon: Isaias Cowman, MD;  Location: [place] CV LAB;  Service: Cardiovascular;  Laterality: N/A;  . TOTAL ABDOMINAL HYSTERECTOMY  1999   history of abnormal pap    Family History  Problem Relation Age of Onset  . Arthritis Mother   . Cancer Mother        uterus cancer  . Hyperlipidemia Mother   . Hypertension Mother   . Heart disease Mother   . Diabetes Mother   . Hyperlipidemia Father   . Hypertension Father   . Heart disease Father   . Diabetes Father   . Cancer Sister        ovary cancer  . Diabetes Maternal Grandmother   . Hypertension Maternal Grandmother   . Arthritis Maternal Grandmother   . Hypertension Maternal Grandfather   . Hypertension Paternal Grandmother   . Hypertension Paternal Grandfather   . Heart disease Paternal Grandfather   . Heart disease Brother   . Kidney disease Brother   . Breast cancer Neg Hx     SOCIAL HX:  reports that she quit smoking about 5 months ago. Her smoking use included cigarettes. She smoked 0.25 packs per day. She has never used smokeless tobacco. She reports current alcohol use of about 2.0 standard drinks of alcohol per week. She reports that she does not use drugs.   Current Outpatient Medications:  .  albuterol (VENTOLIN HFA) 108 (90 Base) MCG/ACT inhaler, INHALE 2 PUFFS BY MOUTH INTO THE LUNGS EVERY 6 HOURS AS NEEDED FOR WHEEZING (Patient taking differently: Inhale 2 puffs into the lungs every 6 (six) hours as needed for wheezing. ), Disp: 54 g, Rfl: 2 .  ALPRAZolam (XANAX) 0.5 MG tablet, Take 0.5-1 tablets (0.25-0.5 mg total) by mouth at bedtime. TAKE ONE TABLET BY MOUTH AT BEDTIME AS NEEDED FOR ANXIETY (Patient taking differently: Take 0.25-0.5 mg by mouth at bedtime. ), Disp: 30 tablet, Rfl: 3 .  aspirin 81  MG tablet, Take 81 mg by mouth daily., Disp: , Rfl:  .  atorvastatin (LIPITOR) 40 MG tablet, TAKE ONE TABLET BY MOUTH ONCE DAILY (Patient taking differently: Take 40 mg by mouth daily. ), Disp: 90 tablet, Rfl: 1 .  carvedilol (COREG) 6.25 MG tablet, Take 1 tablet (6.25 mg total) by mouth 2 (two) times daily with a meal., Disp: 180 tablet, Rfl: 3 .  clopidogrel (PLAVIX) 75 MG tablet, Take 1 tablet (75 mg total) by mouth daily., Disp: 90 tablet, Rfl: 3 .  cyanocobalamin (,VITAMIN B-12,) 1000 MCG/ML injection, Inject 1 mL into the muscle monthly (Patient taking differently: Inject 1,000 mcg into the muscle every 30 (thirty) days. ), Disp: 10  mL, Rfl: 0 .  diphenhydrAMINE (DIPHENHIST) 25 mg capsule, Take 25 mg by mouth at bedtime as needed for allergies or sleep. , Disp: , Rfl:  .  escitalopram (LEXAPRO) 10 MG tablet, Take 1 tablet (10 mg total) by mouth daily., Disp: 90 tablet, Rfl: 1 .  ferrous sulfate 325 (65 FE) MG EC tablet, Take 325 mg by mouth daily with breakfast., Disp: , Rfl:  .  Fluticasone-Umeclidin-Vilant (TRELEGY ELLIPTA) 100-62.5-25 MCG/INH AEPB, Inhale 1 puff into the lungs daily., Disp: 180 each, Rfl: 2 .  furosemide (LASIX) 40 MG tablet, Take 1 tablet (40 mg total) by mouth 2 (two) times daily., Disp: 180 tablet, Rfl: 1 .  ipratropium-albuterol (DUONEB) 0.5-2.5 (3) MG/3ML SOLN, Take 3 mLs by nebulization every 6 (six) hours as needed., Disp: 360 mL, Rfl: 1 .  nitroGLYCERIN (NITROSTAT) 0.4 MG SL tablet, Place 1 tablet (0.4 mg total) under the tongue every 5 (five) minutes as needed for chest pain., Disp: 30 tablet, Rfl: 3 .  omeprazole (PRILOSEC) 40 MG capsule, Take 1 capsule (40 mg total) by mouth daily., Disp: 90 capsule, Rfl: 1 .  potassium chloride (KLOR-CON) 10 MEQ tablet, Take 1 tablet (10 mEq total) by mouth daily., Disp: 90 tablet, Rfl: 1 .  sacubitril-valsartan (ENTRESTO) 24-26 MG, Take 1 tablet by mouth 2 (two) times daily., Disp: 180 tablet, Rfl: 3 .  spironolactone (ALDACTONE)  25 MG tablet, Take 0.5 tablets (12.5 mg total) by mouth daily., Disp: 90 tablet, Rfl: 3 .  traZODone (DESYREL) 50 MG tablet, Take 0.5-1 tablets (25-50 mg total) by mouth daily. One hour before bedtime (Patient taking differently: Take 50 mg by mouth daily. One hour before bedtime), Disp: 90 tablet, Rfl: 3 .  febuxostat (ULORIC) 40 MG tablet, Take 1 tablet (40 mg total) by mouth daily., Disp: 30 tablet, Rfl: 5 .  predniSONE (DELTASONE) 10 MG tablet, 6 tablets daily for 3 days , then reduce by 1 tablet daily until gone, Disp: 33 tablet, Rfl: 0  EXAM:  General impression: alert, cooperative and articulate.  No signs of being in distress  Lungs: speech is fluent sentence length suggests that patient is not short of breath and not punctuated by cough, sneezing or sniffing. [name]   Psych: affect normal.  speech is articulate and non pressured .  Denies suicidal thoughts . pleasant and cooperative, no obvious depression or anxiety, speech and thought processing grossly intact  ASSESSMENT AND PLAN:  Discussed the following assessment and plan:  Acute on chronic combined systolic and diastolic congestive heart failure (HCC)  Gouty arthritis of both feet  Pulmonary emphysema, unspecified emphysema type [facility])  Hospital discharge follow-up  CHF (congestive heart failure) ([place]) With acute on chronic failure , now resolve post hospitalization for diuresis.  ECHO repeated,  And enzymes negative for AMI.  Referral to [facility] C/[facility] surgery to discuss mitral valve replacement in progress.   Gouty arthritis of both feet She has 5 to 6 flares per year.  Uric acid level is high  Starting Uloric 40 mg,  Starting prednisone taper as well .[name]  Repeat uric acid level in 6 weeks   COPD with emphysema ([place]) She is wheezing on exam ..  She was just released from hospital this morning and was not given steroid taper despite admission for hypoxic respiratory failure.  Steroid taper prescribed.   Hospital discharge  follow-up Patient is stable post discharge and has no new issues or questions about discharge plans at the visit today for hospital follow up.  All labs , imaging studies and progress notes from admission were reviewed with patient today      I discussed the assessment and treatment plan with the patient. The patient was provided an opportunity to ask questions and all were answered. The patient agreed with the plan and demonstrated an understanding of the instructions.   The patient was advised to call back or seek an in-person evaluation if the symptoms worsen or if the condition fails to improve as anticipated.  [name], MD

## 2019-11-19 NOTE — Discharge Summary (Signed)
Physician Discharge Summary  Ashley [name]:[id number] DOB: [birth year] DOA: 11/18/2019  PCP: [name], MD  Admit date: 11/18/2019 Discharge date: 11/19/2019  Admitted From: Home  Disposition:  Home   Recommendations for Outpatient Follow-up:  1. Follow up with PCP Dr. Derrel Nip or primary Cardiologist Dr. Saralyn Pilar in 1-2 weeks 2. Dr. Derrel Nip or Dr. Saralyn Pilar: Please obtain BMP in 1 week 3. Please continue referral to [facility] re: mitral valve regurgitation     Home Health: None  Equipment/Devices: None  Discharge Condition: Fair  CODE STATUS: FULL Diet recommendation: Low sodium  Brief/Interim Summary: Ashley [name] is a 69 y.o. F with COPD not on O2, sCHF EF 40-45% with ICD, MR s/p MVR, pHTN, CAD and HTN who presented with dyspnea and orthopnea.  In the ER, SpO2 75% and placed on BiPAP.  CXR showed edema.  BNP 4400.  STarted on IV Lasix and hospitalist service were asked to evaluate for admission.      PRINCIPAL HOSPITAL DIAGNOSIS: Acute on chronic systolic and diastolic CHF complicated by mitral regurgitation    Discharge Diagnoses:   Acute on chronic systolic CHF Pulmonary hypertension, grade 2 Mitral regurgitation Acute hypoxic respiratory failure Patient presented with dyspnea, tachypnea severe hypoxia, requiring BiPAP due to congestive heart failure in setting of severe mitral regurgitation.  She started on IV Lasix, and was able to be weaned off of supplemental oxygen within 48 hours.  She was discharged to resume [name] home Lasix, Entresto, spironolactone, and carvedilol.   COPD No evidence of bronchospasm.  Continue pantoprazole, LAMA, LABA, ICS  Coronary disease secondary prevention Hypertension No evidence of ischemia.  Continue carvedilol, spironolactone, Entresto, Plavix            Discharge Instructions  Discharge Instructions    Diet - low sodium heart healthy   Complete by: As directed    Discharge instructions   Complete by: As  directed    From Dr. Loleta Books: You were admitted for a heart failure flare. This was likely LARGELY affected by your mitral valve.  Please scheduled to see Dr. Saralyn Pilar soon.  I have sent him your discharge summary, so he is aware.  Resume your Lasix as normal, tomorrow (thusrday) Don't take your Entresto until Friday morning.  Call Dr. Sharlet Salina office or Dr. Derrel Nip for lab follow up (you need labs on Friday or Monday)   Increase activity slowly   Complete by: As directed      Allergies as of 11/19/2019      Reactions   Vancomycin Nausea And Vomiting, Palpitations   Hydrocodone-acetaminophen Nausea Only      Medication List    TAKE these medications   albuterol 108 (90 Base) MCG/ACT inhaler Commonly known as: Ventolin HFA INHALE 2 PUFFS BY MOUTH INTO THE LUNGS EVERY 6 HOURS AS NEEDED FOR WHEEZING What changed:   how much to take  how to take this  when to take this  reasons to take this  additional instructions   ALPRAZolam 0.5 MG tablet Commonly known as: XANAX Take 0.5-1 tablets (0.25-0.5 mg total) by mouth at bedtime. TAKE ONE TABLET BY MOUTH AT BEDTIME AS NEEDED FOR ANXIETY What changed: additional instructions   aspirin 81 MG tablet Take 81 mg by mouth daily.   atorvastatin 40 MG tablet Commonly known as: LIPITOR TAKE ONE TABLET BY MOUTH ONCE DAILY   carvedilol 6.25 MG tablet Commonly known as: COREG Take 1 tablet (6.25 mg total) by mouth 2 (two) times daily with a meal.  clopidogrel 75 MG tablet Commonly known as: PLAVIX Take 1 tablet (75 mg total) by mouth daily.   cyanocobalamin 1000 MCG/ML injection Commonly known as: (VITAMIN B-12) Inject 1 mL into the muscle monthly What changed:   how much to take  how to take this  when to take this  additional instructions   Diphenhist 25 mg capsule Generic drug: diphenhydrAMINE Take 25 mg by mouth at bedtime as needed for allergies or sleep.   Entresto 24-26 MG Generic drug:  sacubitril-valsartan Take 1 tablet by mouth 2 (two) times daily.   escitalopram 10 MG tablet Commonly known as: LEXAPRO Take 1 tablet (10 mg total) by mouth daily.   ferrous sulfate 325 (65 FE) MG EC tablet Take 325 mg by mouth daily with breakfast.   nitroGLYCERIN 0.4 MG SL tablet Commonly known as: NITROSTAT Place 1 tablet (0.4 mg total) under the tongue every 5 (five) minutes as needed for chest pain.   omeprazole 40 MG capsule Commonly known as: PRILOSEC Take 1 capsule (40 mg total) by mouth daily.   potassium chloride 10 MEQ tablet Commonly known as: KLOR-CON Take 1 tablet (10 mEq total) by mouth daily.   spironolactone 25 MG tablet Commonly known as: Aldactone Take 0.5 tablets (12.5 mg total) by mouth daily.   traZODone 50 MG tablet Commonly known as: DESYREL Take 0.5-1 tablets (25-50 mg total) by mouth daily. One hour before bedtime What changed: how much to take   Trelegy Ellipta 100-62.5-25 MCG/INH Aepb Generic drug: Fluticasone-Umeclidin-Vilant Inhale 1 puff into the lungs daily.      Follow-up Information    Isaias Cowman, MD. Go on 11/25/2019.   Specialty: Cardiology Why: appointment at 11:45am Contact information: [facility] [place] [address] [phone]          Allergies  Allergen Reactions  . Vancomycin Nausea And Vomiting and Palpitations  . Hydrocodone-Acetaminophen Nausea Only    Consultations:     Procedures/Studies: DG Chest Port 1 View  Result Date: 11/18/2019 CLINICAL DATA:  Shortness of breath EXAM: PORTABLE CHEST 1 VIEW COMPARISON:  October 15, 2019 FINDINGS: The heart size and mediastinal contours are unchanged. Again noted is a right-sided pacemaker with stable lead tips. Overlying median sternotomy wires. There is diffusely increased interstitial markings seen throughout both lungs. There is prominent pulmonary vasculature. No acute osseous abnormality. The patient is  status post plate fixation of the distal left clavicle. IMPRESSION: Findings suggestive of interstitial edema and pulmonary vascular congestion. Stable cardiomegaly Electronically Signed   By: Prudencio Pair M.D.   On: 11/18/2019 00:33   ECHOCARDIOGRAM COMPLETE  Result Date: 11/18/2019   ECHOCARDIOGRAM REPORT   Patient Name:   Ashley [name] [facility] Date of Exam: 11/18/2019 Medical Rec #:  [id number]         Height:       63.0 in Accession #:    [id number]        Weight:       166.0 lb Date of Birth:  1951/05/27         BSA:          1.79 m Patient Age:    41 years          BP:           101/55 mmHg Patient Gender: F                 HR:           69 bpm. Exam  Location:  [facility] Procedure: 2D Echo, Color Doppler and Cardiac Doppler Indications:     CHF- acute diastolic 500.93  History:         Patient has prior history of Echocardiogram examinations, most                  recent 05/27/2019. Pacemaker; Risk Factors:Hypertension. MI,                  Mitral valve disorder.  Sonographer:     Sherrie Sport RDCS (AE) Referring Phys:  [id number] Athena Masse Diagnosing Phys: Nelva Bush MD IMPRESSIONS  1. Left ventricular ejection fraction, by visual estimation, is 30 to 35%. The left ventricle has severely decreased function. There is severely increased left ventricular hypertrophy.  2. Left ventricular diastolic parameters are indeterminate.  3. The left ventricle demonstrates global hypokinesis.  4. Global right ventricle has moderately reduced systolic function.The right ventricular size is normal. Mildly increased right ventricular wall thickness.  5. Left atrial size was mild-moderately dilated.  6. Right atrial size was normal.  7. Presence of pericardial fat pad.  8. The mitral valve has been repaired/replaced. There is at least moderate mitral valve regurgitation. Moderate-severe mitral stenosis.  9. The tricuspid valve is not well visualized. 10. The aortic valve was not well visualized. Aortic valve regurgitation is not  visualized. Mild to moderate aortic valve sclerosis/calcification without any evidence of aortic stenosis. 11. The pulmonic valve was not well visualized. Pulmonic valve regurgitation is not visualized. 12. TR signal is inadequate for assessing pulmonary artery systolic pressure. 13. A pacer wire is visualized in the RV. 14. MV peak gradient, 23.8 mmHg. 15. The interatrial septum was not well visualized. FINDINGS  Left Ventricle: Left ventricular ejection fraction, by visual estimation, is 30 to 35%. The left ventricle has severely decreased function. The left ventricle demonstrates global hypokinesis. The left ventricular internal cavity size was the left ventricle is normal in size. There is severely increased left ventricular hypertrophy. Left ventricular diastolic parameters are indeterminate. Right Ventricle: The right ventricular size is normal. Mildly increased right ventricular wall thickness. Global RV systolic function is has moderately reduced systolic function. Left Atrium: Left atrial size was mild-moderately dilated. Right Atrium: Right atrial size was normal in size Pericardium: There is no evidence of pericardial effusion. Presence of pericardial fat pad. Mitral Valve: The mitral valve has been repaired/replaced. There is at least moderate mitral valve regurgitation. Moderate-severe mitral valve stenosis by observation. MV peak gradient, 23.8 mmHg. Tricuspid Valve: The tricuspid valve is not well visualized. Tricuspid valve regurgitation is trivial. Aortic Valve: The aortic valve was not well visualized. . There is moderate thickening and moderate calcification of the aortic valve. Aortic valve regurgitation is not visualized. Mild to moderate aortic valve sclerosis/calcification is present, without  any evidence of aortic stenosis. There is moderate thickening of the aortic valve. There is moderate calcification of the aortic valve. Aortic valve mean gradient measures 3.7 mmHg. Aortic valve peak  gradient measures 6.0 mmHg. Aortic valve area, by VTI  measures 2.14 cm. Pulmonic Valve: The pulmonic valve was not well visualized. Pulmonic valve regurgitation is not visualized. Pulmonic regurgitation is not visualized. No evidence of pulmonic stenosis. Aorta: The aortic root is normal in size and structure. Pulmonary Artery: The pulmonary artery is not well seen. Venous: The inferior vena cava was not well visualized. IAS/Shunts: The interatrial septum was not well visualized. Additional Comments: A pacer wire is visualized in the right ventricle.  LEFT VENTRICLE  PLAX 2D LVIDd:         4.17 cm  Diastology LVIDs:         2.86 cm  LV e' lateral:   2.72 cm/s LV PW:         1.90 cm  LV E/e' lateral: 59.9 LV IVS:        1.40 cm  LV e' medial:    2.94 cm/s LVOT diam:     2.10 cm  LV E/e' medial:  55.4 LV SV:         46 ml LV SV Index:   24.88 LVOT Area:     3.46 cm  RIGHT VENTRICLE RV Basal diam:  3.51 cm RV S prime:     7.94 cm/s LEFT ATRIUM             Index       RIGHT ATRIUM           Index LA diam:        5.90 cm 3.30 cm/m  RA Area:     11.00 cm LA Vol (A2C):   73.5 ml 41.14 ml/m RA Volume:   22.00 ml  12.32 ml/m LA Vol (A4C):   88.3 ml 49.43 ml/m LA Biplane Vol: 82.6 ml 46.24 ml/m  AORTIC VALVE                   PULMONIC VALVE AV Area (Vmax):    1.55 cm    PV Vmax:        0.65 m/s AV Area (Vmean):   1.42 cm    PV Peak grad:   1.7 mmHg AV Area (VTI):     2.14 cm    RVOT Peak grad: 3 mmHg AV Vmax:           122.00 cm/s AV Vmean:          86.967 cm/s AV VTI:            0.241 m AV Peak Grad:      6.0 mmHg AV Mean Grad:      3.7 mmHg LVOT Vmax:         54.70 cm/s LVOT Vmean:        35.600 cm/s LVOT VTI:          0.149 m LVOT/AV VTI ratio: 0.62  AORTA Ao Root diam: 3.30 cm MITRAL VALVE MV Area (PHT): 2.36 cm              SHUNTS MV Peak grad:  23.8 mmHg             Systemic VTI:  0.15 m MV Mean grad:  10.0 mmHg             Systemic Diam: 2.10 cm MV Vmax:       2.44 m/s MV Vmean:      145.0 cm/s MV VTI:         0.61 m MV PHT:        93.38 msec MV Decel Time: 322 msec MV E velocity: 163.00 cm/s 103 cm/s MV A velocity: 99.40 cm/s  70.3 cm/s MV E/A ratio:  1.64        1.5  Nelva Bush MD Electronically signed by Nelva Bush MD Signature Date/Time: 11/18/2019/4:13:04 PM    Final        Subjective: Patient is feeling better.  No dyspnea on exertion.  No orthopnea.  No swelling.  No chest pain.  No palpitations.  She is constipated.  Discharge Exam: Vitals:   11/19/19 0100 11/19/19 0748  BP: (!) 144/98 (!) 131/93  Pulse: 86 85  Resp: 18 13  Temp: (!) 97.5 F (36.4 C) (!) 97.5 F (36.4 C)  SpO2: 96% 93%   Vitals:   11/18/19 2215 11/18/19 2230 11/19/19 0100 11/19/19 0748  BP:  134/87 (!) 144/98 (!) 131/93  Pulse: 84 86 86 85  Resp: 18 17 18 13   Temp:   (!) 97.5 F (36.4 C) (!) 97.5 F (36.4 C)  TempSrc:   Oral Oral  SpO2: 96% 95% 96% 93%  Weight:      Height:        General: Pt is alert, awake, not in acute distress Cardiovascular: RRR, nl S1-S2, no murmurs appreciated.   No LE edema.   Respiratory: Normal respiratory rate and rhythm.  CTAB without rales or wheezes. Abdominal: Abdomen soft and non-tender.  No distension or HSM.   Neuro/Psych: Strength symmetric in upper and lower extremities.  Judgment and insight appear normal.   The results of significant diagnostics from this hospitalization (including imaging, microbiology, ancillary and laboratory) are listed below for reference.     Microbiology: Recent Results (from the past 240 hour(s))  Respiratory Panel by RT PCR (Flu A&B, Covid) - Nasopharyngeal Swab     Status: None   Collection Time: 11/18/19 12:05 AM   Specimen: Nasopharyngeal Swab  Result Value Ref Range Status   SARS Coronavirus 2 by RT PCR NEGATIVE NEGATIVE Final    Comment: (NOTE) SARS-CoV-2 target nucleic acids are NOT DETECTED. The SARS-CoV-2 RNA is generally detectable in upper respiratoy specimens during the acute phase of infection. The  lowest concentration of SARS-CoV-2 viral copies this assay can detect is 131 copies/mL. A negative result does not preclude SARS-Cov-2 infection and should not be used as the sole basis for treatment or other patient management decisions. A negative result may occur with  improper specimen collection/handling, submission of specimen other than nasopharyngeal swab, presence of viral mutation(s) within the areas targeted by this assay, and inadequate number of viral copies (<131 copies/mL). A negative result must be combined with clinical observations, patient history, and epidemiological information. The expected result is Negative. Fact Sheet for Patients:  PinkCheek.be Fact Sheet for Healthcare Providers:  GravelBags.it This test is not yet ap proved or cleared by the [place] FDA and  has been authorized for detection and/or diagnosis of SARS-CoV-2 by FDA under an Emergency Use Authorization (EUA). This EUA will remain  in effect (meaning this test can be used) for the duration of the COVID-19 declaration under Section 564(b)(1) of the Act, 21 U.S.C. section 360bbb-3(b)(1), unless the authorization is terminated or revoked sooner.    Influenza A by PCR NEGATIVE NEGATIVE Final   Influenza B by PCR NEGATIVE NEGATIVE Final    Comment: (NOTE) The Xpert Xpress SARS-CoV-2/FLU/RSV assay is intended as an aid in  the diagnosis of influenza from Nasopharyngeal swab specimens and  should not be used as a sole basis for treatment. Nasal washings and  aspirates are unacceptable for Xpert Xpress SARS-CoV-2/FLU/RSV  testing. Fact Sheet for Patients: PinkCheek.be Fact Sheet for Healthcare Providers: GravelBags.it This test is not yet approved or cleared by the [place] FDA and  has been authorized for detection and/or diagnosis of SARS-CoV-2 by  FDA under an Emergency  Use Authorization (EUA). This EUA will remain  in effect (meaning this test can be used) for the duration of the  Covid-19 declaration under Section 564(b)(1) of the  Act, 21  U.S.C. section 360bbb-3(b)(1), unless the authorization is  terminated or revoked. Performed at [facility], [address] [place], [place] [address]      Labs: BNP (last 3 results) Recent Labs    09/22/19 0720 10/15/19 1417 11/18/19 0005  BNP 1,885.0* >4,500.0* 3,335.4*   Basic Metabolic Panel: Recent Labs  Lab 11/18/19 0005 11/19/19 0526  NA 134* 134*  K 3.7 3.7  CL 98 97*  CO2 20* 24  GLUCOSE 382* 200*  BUN 21 30*  CREATININE 1.28* 1.50*  CALCIUM 8.8* 9.2  MG 1.7  --    Liver Function Tests: Recent Labs  Lab 11/18/19 0005  AST 22  ALT 16  ALKPHOS 76  BILITOT 1.5*  PROT 7.1  ALBUMIN 4.1   No results for input(s): LIPASE, AMYLASE in the last 168 hours. No results for input(s): AMMONIA in the last 168 hours. CBC: Recent Labs  Lab 11/18/19 0005 11/18/19 0244 11/19/19 0525  WBC 12.3* 12.3* 13.5*  NEUTROABS 8.3*  --   --   HGB 14.0 13.0 11.8*  HCT 40.3 37.2 34.6*  MCV 96.2 95.4 96.6  PLT 293 205 216   Cardiac Enzymes: No results for input(s): CKTOTAL, CKMB, CKMBINDEX, TROPONINI in the last 168 hours. BNP: Invalid input(s): POCBNP CBG: No results for input(s): GLUCAP in the last 168 hours. D-Dimer No results for input(s): DDIMER in the last 72 hours. Hgb A1c No results for input(s): HGBA1C in the last 72 hours. Lipid Profile No results for input(s): CHOL, HDL, LDLCALC, TRIG, CHOLHDL, LDLDIRECT in the last 72 hours. Thyroid function studies No results for input(s): TSH, T4TOTAL, T3FREE, THYROIDAB in the last 72 hours.  Invalid input(s): FREET3 Anemia work up No results for input(s): VITAMINB12, FOLATE, FERRITIN, TIBC, IRON, RETICCTPCT in the last 72 hours. Urinalysis    Component Value Date/Time   COLORURINE STRAW (A) 11/18/2019 0244   APPEARANCEUR CLEAR  (A) 11/18/2019 0244   APPEARANCEUR Clear 08/19/2014 0025   LABSPEC 1.008 11/18/2019 0244   LABSPEC 1.009 08/19/2014 0025   PHURINE 6.0 11/18/2019 0244   GLUCOSEU 50 (A) 11/18/2019 0244   GLUCOSEU 50 mg/dL 08/19/2014 0025   HGBUR SMALL (A) 11/18/2019 0244   BILIRUBINUR NEGATIVE 11/18/2019 0244   BILIRUBINUR Negative 08/19/2014 0025   KETONESUR NEGATIVE 11/18/2019 0244   PROTEINUR NEGATIVE 11/18/2019 0244   NITRITE NEGATIVE 11/18/2019 0244   LEUKOCYTESUR NEGATIVE 11/18/2019 0244   LEUKOCYTESUR Negative 08/19/2014 0025   Sepsis Labs Invalid input(s): PROCALCITONIN,  WBC,  LACTICIDVEN Microbiology Recent Results (from the past 240 hour(s))  Respiratory Panel by RT PCR (Flu A&B, Covid) - Nasopharyngeal Swab     Status: None   Collection Time: 11/18/19 12:05 AM   Specimen: Nasopharyngeal Swab  Result Value Ref Range Status   SARS Coronavirus 2 by RT PCR NEGATIVE NEGATIVE Final    Comment: (NOTE) SARS-CoV-2 target nucleic acids are NOT DETECTED. The SARS-CoV-2 RNA is generally detectable in upper respiratoy specimens during the acute phase of infection. The lowest concentration of SARS-CoV-2 viral copies this assay can detect is 131 copies/mL. A negative result does not preclude SARS-Cov-2 infection and should not be used as the sole basis for treatment or other patient management decisions. A negative result may occur with  improper specimen collection/handling, submission of specimen other than nasopharyngeal swab, presence of viral mutation(s) within the areas targeted by this assay, and inadequate number of viral copies (<131 copies/mL). A negative result must be combined with clinical observations, patient history, and epidemiological  information. The expected result is Negative. Fact Sheet for Patients:  PinkCheek.be Fact Sheet for Healthcare Providers:  GravelBags.it This test is not yet ap proved or cleared by the  [place] FDA and  has been authorized for detection and/or diagnosis of SARS-CoV-2 by FDA under an Emergency Use Authorization (EUA). This EUA will remain  in effect (meaning this test can be used) for the duration of the COVID-19 declaration under Section 564(b)(1) of the Act, 21 U.S.C. section 360bbb-3(b)(1), unless the authorization is terminated or revoked sooner.    Influenza A by PCR NEGATIVE NEGATIVE Final   Influenza B by PCR NEGATIVE NEGATIVE Final    Comment: (NOTE) The Xpert Xpress SARS-CoV-2/FLU/RSV assay is intended as an aid in  the diagnosis of influenza from Nasopharyngeal swab specimens and  should not be used as a sole basis for treatment. Nasal washings and  aspirates are unacceptable for Xpert Xpress SARS-CoV-2/FLU/RSV  testing. Fact Sheet for Patients: PinkCheek.be Fact Sheet for Healthcare Providers: GravelBags.it This test is not yet approved or cleared by the [place] FDA and  has been authorized for detection and/or diagnosis of SARS-CoV-2 by  FDA under an Emergency Use Authorization (EUA). This EUA will remain  in effect (meaning this test can be used) for the duration of the  Covid-19 declaration under Section 564(b)(1) of the Act, 21  U.S.C. section 360bbb-3(b)(1), unless the authorization is  terminated or revoked. Performed at [facility], [address], [place], [place] [address]      Time coordinating discharge: 35 minutes      SIGNED:   Edwin Dada, MD  [facility] 11/19/2019, 6:37 PM

## 2019-11-19 NOTE — Progress Notes (Signed)
Educated patient on discharge instructions that Dr. Loleta Books provided. Patient verbalized understanding and has no questions at this time. Patient will be driven home by family member.

## 2019-11-20 ENCOUNTER — Other Ambulatory Visit: Payer: Self-pay | Admitting: *Deleted

## 2019-11-20 ENCOUNTER — Encounter: Payer: Self-pay | Admitting: *Deleted

## 2019-11-20 ENCOUNTER — Telehealth: Payer: Medicare Other

## 2019-11-20 DIAGNOSIS — M109 Gout, unspecified: Secondary | ICD-10-CM | POA: Insufficient documentation

## 2019-11-20 NOTE — Assessment & Plan Note (Signed)
She is wheezing on exam ..  She was just released from hospital this morning and was not given steroid taper despite admission for hypoxic respiratory failure.  Steroid taper prescribed.

## 2019-11-20 NOTE — Assessment & Plan Note (Signed)
With acute on chronic failure , now resolve post hospitalization for diuresis.  ECHO repeated,  And enzymes negative for AMI.  Referral to [facility] surgery to discuss mitral valve replacement in progress.

## 2019-11-20 NOTE — Assessment & Plan Note (Signed)
Patient is stable post discharge and has no new issues or questions about discharge plans at the visit today for hospital follow up. All labs , imaging studies and progress notes from admission were reviewed with patient today   

## 2019-11-20 NOTE — Assessment & Plan Note (Signed)
She has 5 to 6 flares per year.  Uric acid level is high  Starting Uloric 40 mg,  Starting prednisone taper as well .[name]  Repeat uric acid level in 6 weeks

## 2019-11-20 NOTE — Patient Outreach (Signed)
[facility]) [place] Telephone Outreach PCP office completes Transition of Care follow up post-hospital discharge Post-hospital discharge day # 1  11/20/2019  Ashley [name] 1950/12/09 [id number]  Successful telephone outreach to Ashley [name], 69 y/o femaleoriginally referred to [place] by [facility] Liaison RN CM afterhospitalization July 21-25, 2041for acute on chronic CHF exacerbation.Patient has history including, but not limited to, combined CHF; CAD with previous MI/ CABG, and ICD; HTN/ HLD; GERD; COPD;history oftobacco use-patient has quit smoking since June 19, 2019.  Patient has multiple hospital re-admissions over the last year, most within 30 days of one another; all related to acute respiratory failure due to CHF exacerbation.    Patient was most recently hospitalized January 12-13, 2021 (33 days post-last hospital discharge), again with acute respiratory failure secondary to CHF exacerbation.  Patient was discharged home to self-care without home health services in place.   HIPAA/ identity verified with patient;today,patient reports "doing better and taking a nap;" denies clinical concerns post-hospital discharge and states she is "exhausted as usual after hospital visit."  Patient denies pain, new/ recent falls, and sounds to be in no apparent distress throughout phone call today.  Patient further reports:  -- reviewed with patient this week's pre-hospital admission cardiology office visit 11/17/19 and post- hospital discharge PCP office visit 11/19/19: patient reports today that she heard back "quickly" from cardiothoracic surgeon at [facility] and has an appointment scheduled for Thursday November 27, 2019 for evaluation for surgical intervention for needed MVR revision!!!!  She confirms that has plans to attend this appointment and has reliable transportation ---- confirms that she has obtained/ is taking newly  prescribed medications from PCP office visit yesterday; denies questions and concerns around medications, and confirms that she continues self-managing medications ---- reviewed other upcoming provider appointments with patient and confirmed that she is aware of all and plans to attend all  -- continues to monitor and record daily weights and vital signs; reports "all fine in good ranges today;" reports weight this morning of "166 lbs"  Patient denies further issues, concerns, or problems today. I confirmed that patient hasmy direct phone number, the main [facility] CM office phone number, and the [facility] CM 24-hour nurse advice phone number should issues arise prior to next scheduled [place] outreachpost-upcoming cardiothoracic surgeon provider office visit. Encouraged patient to contact me directly if needs, questions, issues, or concerns arise prior to next scheduled outreach; patient agreed to do so.  Plan:  Patient will take medications as prescribed and will attend all scheduled provider appointments  Patient will promptly notify care providers for any new concerns/ issues/ problems that arise  Patient will continue monitoring/ recording daily weightsand following action plan for CHF zones  Patient will continuenotsmoking  [place] outreach to continue with scheduled phone call within 2 weeks, post- upcoming scheduled appointment with cardiothoracic surgeon  [facility] CM Care Plan Problem One     Most Recent Value  Care Plan Problem One  High risk for hospital readmission related to/ as evidenced by multiple recent hospitalizations for Acute Respiratory Failure/ CHF exacerbation with most recent hospital discharge on 11/17/2018  Role Documenting the Problem One  Care Management [place] for Problem One  Active  [facility] Long Term Goal   Over the next 31 days, patient will not experience unplanned hospital readmission, as evidenced by patient reporting and review  of EMR during [facility] RN CCM outreach [Goal extended around recent hosp discharge]  [facility] Long Term Goal Start Date  11/20/19 [Goal re-established today]  Interventions for Problem One Long Term Goal  Discussed with patient her current clinical condition and ocnfirmed that she has no current clinical concerns,  reviewed most recent hospital visit and post-hospital discharge instructions,  confirmed that patient has obtained and is taking medications as prescribed during PCP office visit yesterday,  reviewed this week's cardiology and PCP office visits with patient,  confirmed that patient continues monitoring and recording daily weights at home and reviewed recent weights with patient    [facility] CM Care Plan Problem Two     Most Recent Value  Care Plan Problem Two  Need for ongoing reinforcement of self-health management strategies for chronic disease state of CHF/ COPD, as evidenced by patient reporting and recent hospitalization for CHF exacerbation  Role Documenting the Problem Two  Care Management Coordinator  Care Plan for Problem Two  Active  [facility] CM Short Term Goal #1   Over the next 14 days, patient will attend scheduled cardiac surgeon office visit and will discuss plan for definitive surgical intervention to address need for MVR revision, as evidenced by patient reporting and collaboration with care providers as indicated during [facility] RN CM outreach  [facility] CM Short Term Goal #1 Start Date  11/20/19  Interventions for Short Term Goal #2   Confirmed with patient that she has scheduled follow up appointment with cardiothoracic surgeon in one week,  confirmed that she has plans to attend appointment and has reliable transportation,  coached patient in talking points to cover with cardiothoracic surgeon  [facility] CM Short Term Goal #2   Over the next 30 days, patient will verbalize plan to obtain needed intervention for MVR revision as evidenced by patient reporting during [facility] RN CM outreach  [facility] CM Short Term Goal #2  Start Date  11/20/19  Interventions for Short Term Goal #2  Reviewed patient's recent hospitalization trends with her and encouraged her to be empowered to discuss need for urgent scheduling of definitive intervention with cardiothoracic surgeon     [name], RN, BSN, [place] Coordinator [facility] Care Management  [phone]

## 2019-11-21 ENCOUNTER — Telehealth: Payer: Self-pay | Admitting: Family

## 2019-11-21 NOTE — Telephone Encounter (Signed)
Spoke with Ashley [name] to follow up since her recent hospital d/c on 1/15. Patient states she is doing ok just trying to get back to normal. She is checking her weight daily at home and taking her meds as prescribed without any issues or complaints. She is still following her low sodium diet as always and is not exercising but plans to get going with it. She is having some on and off sob but not all the time with no other symptoms present as well as can preform her daily ADLs without complication.She confirmed her upcomming f/u appt we have scheduled for 1/25.   [name], [place]

## 2019-11-27 DIAGNOSIS — Z87891 Personal history of nicotine dependence: Secondary | ICD-10-CM | POA: Diagnosis not present

## 2019-11-27 DIAGNOSIS — Z951 Presence of aortocoronary bypass graft: Secondary | ICD-10-CM | POA: Diagnosis not present

## 2019-11-27 DIAGNOSIS — I340 Nonrheumatic mitral (valve) insufficiency: Secondary | ICD-10-CM | POA: Diagnosis not present

## 2019-11-27 DIAGNOSIS — K089 Disorder of teeth and supporting structures, unspecified: Secondary | ICD-10-CM | POA: Diagnosis not present

## 2019-11-27 DIAGNOSIS — Z9889 Other specified postprocedural states: Secondary | ICD-10-CM | POA: Diagnosis not present

## 2019-11-27 DIAGNOSIS — I255 Ischemic cardiomyopathy: Secondary | ICD-10-CM | POA: Diagnosis not present

## 2019-11-27 DIAGNOSIS — I509 Heart failure, unspecified: Secondary | ICD-10-CM | POA: Diagnosis not present

## 2019-11-27 DIAGNOSIS — I052 Rheumatic mitral stenosis with insufficiency: Secondary | ICD-10-CM | POA: Diagnosis not present

## 2019-11-27 DIAGNOSIS — J449 Chronic obstructive pulmonary disease, unspecified: Secondary | ICD-10-CM | POA: Diagnosis not present

## 2019-11-30 NOTE — Progress Notes (Signed)
Patient ID: Ashley [name], female    DOB: 12-30-1950, 69 y.o.   MRN: [id number]  HPI  Ashley [name] is a 69 y/o female with a history of CAD (CABG), hyperlipidemia, HTN, CKD, GERD, depression, COPD, PVD, current tobacco use and chronic heart failure.   Echo report from 11/18/19 reviewed and showed an EF of 30-35% along with moderate/severe Ashley. Echo report from 05/27/2019 reviewed and showed an EF of 40-45% along with moderate/ severe MR. Echo report from 08/22/18 reviewed and showed an EF of 30% along with moderate MR. Echo report from 06/21/16 reviewed and showed an EF of 55-60% along with mild AR and moderate MR.   Catheterization done 08/26/2019 and showed:  There is moderate mitral valve stenosis and moderate (3+) mitral regurgitation.  Ost Cx to Prox Cx lesion is 90% stenosed.  Prox Cx lesion is 100% stenosed.  Prox LAD lesion is 90% stenosed.  Ost RCA to Prox RCA lesion is 100% stenosed.  Origin lesion is 100% stenosed.  Origin to Prox Graft lesion is 100% stenosed.  Hemodynamic findings consistent with severe pulmonary hypertension.   1.  Severe three-vessel coronary artery disease with 90% stenosis proximal LAD, 100% stenosis mid left circumflex, 100% stenosis proximal RCA 2.  Patent LIMA to LAD, occluded SVG to distal RCA and OM1 3.  Dilated cardiomyopathy with severely reduced left ventricular function, LVEF 20 to 25% 4.  Moderate to severe pulmonary hypertension 5.  Moderate mitral regurgitation 6.  Moderate mitral stenosis   Admitted 11/18/19 due to acute on chronic HF. Initially given IV lasix and then transitioned to oral diuretics. Initially needed bipap. Discharged the following day. Was in the ED 10/15/2019 due to acute heart failure and chest pain. Treated and released. Admitted 09/22/2019 due to acute on chronic HF due to flash pulmonary edema. Cardiology consult obtained. Initially given IV lasix and then transitioned to oral diuretics. Discharged after 2 days.  Admitted 08/24/2019 due to acute respiratory failure due to acute on chronic heart failure. Cardiology consult obtained. Initially needed bipap and IV lasix and then was transitioned to oral diuretics. Discharged after 3 days. Admitted 08/12/2019 due to acute HF/COPD exacerbation. Cardiology consult obtained. Initially given IV lasix and then transitioned to oral diuretics after a brief hold due to worsening renal function. Also given prednisone and nebulizer therapy. Discharged after 3 days. Was in the ED 08/02/2019 due to chest pain where she was treated and released. Admitted 07/16/2019 due to acute on chronic HF. Medication adjustments made. Discharged after 3 days.   She presents today for a follow-up visit with a chief complaint of minimal shortness of breath upon moderate exertion. She describes this as chronic in nature having been present for several years. She has associated cough, fatigue and slight weight gain along with this. She denies any difficulty sleeping, dizziness, abdominal distention, palpitations, pedal edema, chest pain or wheezing.   Has been to see cardiothoracic surgeon at [facility] and work-up is in progress to repair her mitral valve. Waiting on a an appointment for a nuclear stress test.   Past Medical History:  Diagnosis Date  . AICD (automatic cardioverter/defibrillator) present    on right side  . Anemia   . CAD (coronary artery disease)    s/p CABG  . CHF (congestive heart failure) ([place])   . Chronic kidney disease    renal artery stenosis  . Colon cancer ([place])   . Depression   . GERD (gastroesophageal reflux disease)   . Hx of  colonic polyps   . Hyperlipidemia   . Hypertension   . Mitral valve disorder    s/p mitral valve repair wth CABG  . Myocardial infarction ([place])   . Peripheral vascular disease ([place])   . Presence of permanent cardiac pacemaker    Pacemaker/ Defibrillator  . Tobacco abuse 10/11/2014   Past Surgical History:  Procedure Laterality Date  . arm  surgery     fracture, has plates and screws  . CABG with mitral valve repair    . CARDIAC CATHETERIZATION    . COLON SURGERY     colon cancer  . COLONOSCOPY WITH PROPOFOL N/A 10/09/2016   Procedure: COLONOSCOPY WITH PROPOFOL;  Surgeon: Jonathon Bellows, MD;  Location: [facility] ENDOSCOPY;  Service: Endoscopy;  Laterality: N/A;  . COLONOSCOPY WITH PROPOFOL N/A 07/24/2018   Procedure: COLONOSCOPY WITH PROPOFOL;  Surgeon: Virgel Manifold, MD;  Location: [facility] ENDOSCOPY;  Service: Endoscopy;  Laterality: N/A;  . CORONARY ARTERY BYPASS GRAFT    . ENTEROSCOPY N/A 11/21/2018   Procedure: ENTEROSCOPY-BALLOON;  Surgeon: Jonathon Bellows, MD;  Location: [facility] ENDOSCOPY;  Service: Gastroenterology;  Laterality: N/A;  . ESOPHAGOGASTRODUODENOSCOPY (EGD) WITH PROPOFOL N/A 07/24/2018   Procedure: ESOPHAGOGASTRODUODENOSCOPY (EGD) WITH PROPOFOL;  Surgeon: Virgel Manifold, MD;  Location: [facility] ENDOSCOPY;  Service: Endoscopy;  Laterality: N/A;  . GIVENS CAPSULE STUDY N/A 08/16/2018   Procedure: GIVENS CAPSULE STUDY;  Surgeon: Virgel Manifold, MD;  Location: [facility] ENDOSCOPY;  Service: Endoscopy;  Laterality: N/A;  . pace maker defib  2016  . PERIPHERAL VASCULAR CATHETERIZATION N/A 10/23/2016   Procedure: Renal Angiography;  Surgeon: Algernon Huxley, MD;  Location: [place] CV LAB;  Service: Cardiovascular;  Laterality: N/A;  . RIGHT/LEFT HEART CATH AND CORONARY ANGIOGRAPHY N/A 08/26/2019   Procedure: RIGHT/LEFT HEART CATH AND CORONARY ANGIOGRAPHY;  Surgeon: Isaias Cowman, MD;  Location: [place] CV LAB;  Service: Cardiovascular;  Laterality: N/A;  . TOTAL ABDOMINAL HYSTERECTOMY  1999   history of abnormal pap   Family History  Problem Relation Age of Onset  . Arthritis Mother   . Cancer Mother        uterus cancer  . Hyperlipidemia Mother   . Hypertension Mother   . Heart disease Mother   . Diabetes Mother   . Hyperlipidemia Father   . Hypertension Father   . Heart disease Father   . Diabetes  Father   . Cancer Sister        ovary cancer  . Diabetes Maternal Grandmother   . Hypertension Maternal Grandmother   . Arthritis Maternal Grandmother   . Hypertension Maternal Grandfather   . Hypertension Paternal Grandmother   . Hypertension Paternal Grandfather   . Heart disease Paternal Grandfather   . Heart disease Brother   . Kidney disease Brother   . Breast cancer Neg Hx    Social History   Tobacco Use  . Smoking status: Former Smoker    Packs/day: 0.25    Types: Cigarettes    Quit date: 06/19/2019    Years since quitting: 0.4  . Smokeless tobacco: Never Used  . Tobacco comment: reports smoking 2-3 cigarettes/ day  Substance Use Topics  . Alcohol use: Yes    Alcohol/week: 2.0 standard drinks    Types: 2 Shots of liquor per week    Comment: occasion   Allergies  Allergen Reactions  . Vancomycin Nausea And Vomiting and Palpitations  . Hydrocodone-Acetaminophen Nausea Only   Prior to Admission medications   Medication Sig Start Date End Date  Taking? Authorizing Provider  albuterol (VENTOLIN HFA) 108 (90 Base) MCG/ACT inhaler INHALE 2 PUFFS BY MOUTH INTO THE LUNGS EVERY 6 HOURS AS NEEDED FOR WHEEZING Patient taking differently: Inhale 2 puffs into the lungs every 6 (six) hours as needed for wheezing.  09/04/19  Yes Tyler Pita, MD  ALPRAZolam Duanne Moron) 0.5 MG tablet Take 0.5-1 [name] (0.25-0.5 mg total) by mouth at bedtime. TAKE ONE TABLET BY MOUTH AT BEDTIME AS NEEDED FOR ANXIETY Patient taking differently: Take 0.25-0.5 mg by mouth at bedtime.  09/05/19  Yes [name], MD  aspirin 81 MG tablet Take 81 mg by mouth daily.   Yes [provider]  atorvastatin (LIPITOR) 40 MG tablet TAKE ONE TABLET BY MOUTH ONCE DAILY Patient taking differently: Take 40 mg by mouth daily.  07/25/19  Yes [name], MD  carvedilol (COREG) 6.25 MG tablet Take 1 tablet (6.25 mg total) by mouth 2 (two) times daily with a meal. 09/05/19  Yes [name], MD   clopidogrel (PLAVIX) 75 MG tablet Take 1 tablet (75 mg total) by mouth daily. 09/05/19  Yes Charleen Madera, Otila Kluver A, FNP  cyanocobalamin (,VITAMIN B-12,) 1000 MCG/ML injection Inject 1 mL into the muscle monthly Patient taking differently: Inject 1,000 mcg into the muscle every 30 (thirty) days.  09/05/19  Yes [name], MD  diphenhydrAMINE (DIPHENHIST) 25 mg capsule Take 25 mg by mouth at bedtime as needed for allergies or sleep.    Yes [provider]  escitalopram (LEXAPRO) 10 MG tablet Take 1 tablet (10 mg total) by mouth daily. 09/05/19  Yes [name], MD  febuxostat (ULORIC) 40 MG tablet Take 1 tablet (40 mg total) by mouth daily. 11/19/19  Yes [name], MD  ferrous sulfate 325 (65 FE) MG EC tablet Take 325 mg by mouth daily with breakfast.   Yes [provider]  Fluticasone-Umeclidin-Vilant (TRELEGY ELLIPTA) 100-62.5-25 MCG/INH AEPB Inhale 1 puff into the lungs daily. 09/04/19  Yes Tyler Pita, MD  furosemide (LASIX) 40 MG tablet Take 1 tablet (40 mg total) by mouth 2 (two) times daily. 11/19/19  Yes [name], MD  ipratropium-albuterol (DUONEB) 0.5-2.5 (3) MG/3ML SOLN Take 3 mLs by nebulization every 6 (six) hours as needed. 11/19/19  Yes [name], MD  nitroGLYCERIN (NITROSTAT) 0.4 MG SL tablet Place 1 tablet (0.4 mg total) under the tongue every 5 (five) minutes as needed for chest pain. 09/03/19  Yes [name], MD  omeprazole (PRILOSEC) 40 MG capsule Take 1 capsule (40 mg total) by mouth daily. 09/05/19  Yes [name], MD  potassium chloride (KLOR-CON) 10 MEQ tablet Take 1 tablet (10 mEq total) by mouth daily. 09/05/19  Yes [name], MD  sacubitril-valsartan (ENTRESTO) 24-26 MG Take 1 tablet by mouth 2 (two) times daily. 09/05/19  Yes Kyleigha Markert, Otila Kluver A, FNP  spironolactone (ALDACTONE) 25 MG tablet Take 0.5 [name] (12.5 mg total) by mouth daily. 09/05/19 09/04/20 Yes Tienna Bienkowski, Otila Kluver A, FNP  traZODone (DESYREL) 50 MG tablet Take  0.5-1 [name] (25-50 mg total) by mouth daily. One hour before bedtime Patient taking differently: Take 50 mg by mouth daily. One hour before bedtime 09/05/19  Yes [name], MD     Review of Systems  Constitutional: Positive for fatigue. Negative for appetite change.  HENT: Negative for congestion, postnasal drip and sore throat.   Eyes: Negative.   Respiratory: Positive for cough and shortness of breath (with moderate exertion). Negative for chest  tightness and wheezing.   Cardiovascular: Negative for chest pain, palpitations and leg swelling.  Gastrointestinal: Negative for abdominal distention and abdominal pain.  Endocrine: Negative.   Genitourinary: Negative.   Musculoskeletal: Negative for back pain and neck pain.  Skin: Negative.   Allergic/Immunologic: Negative.   Neurological: Negative for dizziness and light-headedness.  Hematological: Negative for adenopathy. Does not bruise/bleed easily.  Psychiatric/Behavioral: Negative for dysphoric mood and sleep disturbance (sleeping on 2 pillows). The patient is not nervous/anxious.    Vitals:   12/01/19 1317  BP: 129/80  Pulse: 71  Resp: 20  SpO2: 100%  Weight: 165 lb 6.4 oz (75 kg)  Height: 5\' 3"  (1.6 m)   Wt Readings from Last 3 Encounters:  12/01/19 165 lb 6.4 oz (75 kg)  11/19/19 166 lb (75.3 kg)  11/18/19 166 lb (75.3 kg)   Lab Results  Component Value Date   CREATININE 1.50 (H) 11/19/2019   CREATININE 1.28 (H) 11/18/2019   CREATININE 1.35 (H) 10/16/2019     Physical Exam Vitals and nursing note reviewed.  Constitutional:      Appearance: She is well-developed.  HENT:     Head: Normocephalic and atraumatic.  Neck:     Vascular: No JVD.  Cardiovascular:     Rate and Rhythm: Normal rate and regular rhythm.  Pulmonary:     Effort: Pulmonary effort is normal. No respiratory distress.     Breath sounds: No wheezing or rales.  Abdominal:     General: There is no distension.     Palpations: Abdomen is  soft.  Musculoskeletal:     Cervical back: Normal range of motion and neck supple.     Right lower leg: No tenderness. No edema.     Left lower leg: No tenderness. No edema.  Skin:    General: Skin is warm and dry.  Neurological:     Mental Status: She is alert and oriented to person, place, and time.  Psychiatric:        Behavior: Behavior normal.    Assessment & Plan:  1: Chronic heart failure with reduced ejection fraction- - NYHA class II - euvolemic today - weighing daily and she was reminded to call for an overnight weight gain of >2 pounds or a weekly weight gain of >5 pounds - weight up 6 pounds from last visit here 2 months ago - not adding salt and has been reading food labels. Reminded to closely follow a 2000mg  sodium diet  - saw cardiology ([place]) 11/17/19 & returns later today - BNP 11/18/19 was 4403.0 - discussed paramedicine program with patient but she is not interested at this time - patient says that she's received her flu vaccine for this season  2: HTN- - BP looks good today - had telemedicine visit with PCP Derrel Nip) 09/03/2019 - BMP done 11/19/19 reviewed and showed sodium 134, potassium 3.7, creatinine 1.50 and GFR 36  3: Tobacco use- - she says that she's been tobacco free since 06/19/2019 - PFT's done 11/27/19 - continued complete cessation discussed for 3 minutes with her.  4: Severe MR- - had initial consult with cardiothoracic surgeon 11/27/19 - cardiac MRI done 11/04/2019 which showed EF of 25% along with severe MR/Ashley, mild AR; PDA occluded - waiting on appointment to have a nuclear stress test performed  Patient did not bring her medications nor a list. Each medication was verbally reviewed with the patient and she was encouraged to bring the bottles to every visit to confirm accuracy of list.  Patient opts to not make a return appointment at this time. Prefers to wait until her work-up and surgery at [facility] has been completed. Advised patient to  call back at anytime to schedule another appointment.

## 2019-12-01 ENCOUNTER — Encounter: Payer: Self-pay | Admitting: *Deleted

## 2019-12-01 ENCOUNTER — Encounter: Payer: Self-pay | Admitting: Family

## 2019-12-01 ENCOUNTER — Ambulatory Visit: Payer: Medicare Other | Attending: Family | Admitting: Family

## 2019-12-01 ENCOUNTER — Other Ambulatory Visit: Payer: Self-pay | Admitting: *Deleted

## 2019-12-01 ENCOUNTER — Other Ambulatory Visit: Payer: Self-pay

## 2019-12-01 VITALS — BP 129/80 | HR 71 | Resp 20 | Ht 63.0 in | Wt 165.4 lb

## 2019-12-01 DIAGNOSIS — K219 Gastro-esophageal reflux disease without esophagitis: Secondary | ICD-10-CM | POA: Diagnosis not present

## 2019-12-01 DIAGNOSIS — Z90710 Acquired absence of both cervix and uterus: Secondary | ICD-10-CM | POA: Diagnosis not present

## 2019-12-01 DIAGNOSIS — I10 Essential (primary) hypertension: Secondary | ICD-10-CM

## 2019-12-01 DIAGNOSIS — N189 Chronic kidney disease, unspecified: Secondary | ICD-10-CM | POA: Diagnosis not present

## 2019-12-01 DIAGNOSIS — Z87891 Personal history of nicotine dependence: Secondary | ICD-10-CM | POA: Insufficient documentation

## 2019-12-01 DIAGNOSIS — F329 Major depressive disorder, single episode, unspecified: Secondary | ICD-10-CM | POA: Diagnosis not present

## 2019-12-01 DIAGNOSIS — I25810 Atherosclerosis of coronary artery bypass graft(s) without angina pectoris: Secondary | ICD-10-CM | POA: Insufficient documentation

## 2019-12-01 DIAGNOSIS — Z881 Allergy status to other antibiotic agents status: Secondary | ICD-10-CM | POA: Diagnosis not present

## 2019-12-01 DIAGNOSIS — I5022 Chronic systolic (congestive) heart failure: Secondary | ICD-10-CM | POA: Diagnosis not present

## 2019-12-01 DIAGNOSIS — Z7902 Long term (current) use of antithrombotics/antiplatelets: Secondary | ICD-10-CM | POA: Insufficient documentation

## 2019-12-01 DIAGNOSIS — Z85038 Personal history of other malignant neoplasm of large intestine: Secondary | ICD-10-CM | POA: Diagnosis not present

## 2019-12-01 DIAGNOSIS — I052 Rheumatic mitral stenosis with insufficiency: Secondary | ICD-10-CM | POA: Diagnosis not present

## 2019-12-01 DIAGNOSIS — J449 Chronic obstructive pulmonary disease, unspecified: Secondary | ICD-10-CM | POA: Insufficient documentation

## 2019-12-01 DIAGNOSIS — I252 Old myocardial infarction: Secondary | ICD-10-CM | POA: Insufficient documentation

## 2019-12-01 DIAGNOSIS — Z885 Allergy status to narcotic agent status: Secondary | ICD-10-CM | POA: Diagnosis not present

## 2019-12-01 DIAGNOSIS — I5031 Acute diastolic (congestive) heart failure: Secondary | ICD-10-CM | POA: Diagnosis not present

## 2019-12-01 DIAGNOSIS — I130 Hypertensive heart and chronic kidney disease with heart failure and stage 1 through stage 4 chronic kidney disease, or unspecified chronic kidney disease: Secondary | ICD-10-CM | POA: Diagnosis not present

## 2019-12-01 DIAGNOSIS — Z951 Presence of aortocoronary bypass graft: Secondary | ICD-10-CM | POA: Diagnosis not present

## 2019-12-01 DIAGNOSIS — D649 Anemia, unspecified: Secondary | ICD-10-CM | POA: Diagnosis not present

## 2019-12-01 DIAGNOSIS — Z79899 Other long term (current) drug therapy: Secondary | ICD-10-CM | POA: Diagnosis not present

## 2019-12-01 DIAGNOSIS — Z8249 Family history of ischemic heart disease and other diseases of the circulatory system: Secondary | ICD-10-CM | POA: Insufficient documentation

## 2019-12-01 DIAGNOSIS — Z8049 Family history of malignant neoplasm of other genital organs: Secondary | ICD-10-CM | POA: Insufficient documentation

## 2019-12-01 DIAGNOSIS — Z95810 Presence of automatic (implantable) cardiac defibrillator: Secondary | ICD-10-CM | POA: Diagnosis not present

## 2019-12-01 DIAGNOSIS — I340 Nonrheumatic mitral (valve) insufficiency: Secondary | ICD-10-CM

## 2019-12-01 DIAGNOSIS — I739 Peripheral vascular disease, unspecified: Secondary | ICD-10-CM | POA: Insufficient documentation

## 2019-12-01 DIAGNOSIS — I272 Pulmonary hypertension, unspecified: Secondary | ICD-10-CM | POA: Insufficient documentation

## 2019-12-01 DIAGNOSIS — E785 Hyperlipidemia, unspecified: Secondary | ICD-10-CM | POA: Insufficient documentation

## 2019-12-01 DIAGNOSIS — I214 Non-ST elevation (NSTEMI) myocardial infarction: Secondary | ICD-10-CM | POA: Diagnosis not present

## 2019-12-01 DIAGNOSIS — Z720 Tobacco use: Secondary | ICD-10-CM

## 2019-12-01 DIAGNOSIS — Z7982 Long term (current) use of aspirin: Secondary | ICD-10-CM | POA: Insufficient documentation

## 2019-12-01 DIAGNOSIS — Z8041 Family history of malignant neoplasm of ovary: Secondary | ICD-10-CM | POA: Insufficient documentation

## 2019-12-01 DIAGNOSIS — Z833 Family history of diabetes mellitus: Secondary | ICD-10-CM | POA: Insufficient documentation

## 2019-12-01 DIAGNOSIS — Z8349 Family history of other endocrine, nutritional and metabolic diseases: Secondary | ICD-10-CM | POA: Insufficient documentation

## 2019-12-01 NOTE — Patient Instructions (Addendum)
Continue weighing daily and call for an overnight weight gain of > 2 pounds or a weekly weight gain of >5 pounds.  Call to schedule an appointment at anytime

## 2019-12-01 NOTE — Patient Outreach (Signed)
[place] [facility]) Care Management [place] Telephone Outreach PCP office completes Transition of Care outreach post-hospital discharge Post- (most recent) hospital discharge day # 12 12/01/2019  Ashley [name] 05/10/1951 [id number]  Successful telephone outreach to Ashley [name], 69 y/o femaleoriginally referred to [place] by [facility] Liaison RN CM afterhospitalization July 21-25, 2050fr acute on chronic CHF exacerbation.Patient has history including, but not limited to, combined CHF; CAD with previous MI/ CABG, and ICD; HTN/ HLD; GERD; COPD;history oftobacco use-patient has quit smoking since June 19, 2019.  Patient has multiple hospital re-admissions over the last year, most within 30 days of one another; all related to acute respiratory failure due to CHF exacerbation.   Patient was most recently hospitalized January 12-13, 2021 (33 days post-last hospital discharge), again with acute respiratory failure secondary to CHF exacerbation.  Patient was discharged home to self-care without home health services in place.   HIPAA/ identity verified with patient;today,patient reports "doingfine;" denies clinical concerns post-hospital discharge and denies pain/ difficulty breathing; weight gain outside her goal weight of 168 lbs; reports continues performing daily weights at home and reports weight today of "166 lbs."  Patient further reports: -- confirms that [facility] states visit went well; states provider confirmed that she does need surgical revision/ replacement of mitral valve; reports that provider has recommended stress test be completed first.  Reports to hear from cardiothoracic team by Wednesday to schedule- confirms that she has contact information for provider, and plans to call if she does not hear back from team by end of week  ---- reviewed upcoming  provider appointments with patient and confirmed that she is aware of all and plans to attend CHF and cardiology office visits this afternoon- confirmed that she will provide transportation for herself  -- denies medication concerns: continues taking all medications as prescribed and self-manages medications   -- continues to deny community resource needs: SDOH updated for transportation/ housing/ food insecurity  -- continues to monitor and record daily weights and vital signs;reports"all fine in good ranges today;" reports weight this morning of "166 lbs"  Patient denies further issues, concerns, or problems today. I confirmed that patient hasmy direct phone number, the main [facility] office phone number, and the [facility] 24-hour nurse advice phone number should issues arise prior to next scheduled [place]. Encouraged patient to contact me directly if needs, questions, issues, or concerns arise prior to next scheduled outreach; patient agreed to do so.  Plan:  Patient will take medications as prescribed and will attend all scheduled provider appointments  Patient will promptly notify care providers for any new concerns/ issues/ problems that arise  Patient will continue monitoring/ recording daily weightsand following action plan for CHF zones  Patient will continuenotsmoking  I will share today's [facility] notes/ care plan with patient's PCP as quarterly summary  [facility] CM outreach to continue with scheduled phone callwithin 2 weeks  [facility] Care [facility] Problem One     Most Recent Value  Care Plan Problem One  High risk for hospital readmission related to/ as evidenced by multiple recent hospitalizations for Acute Respiratory Failure/ CHF exacerbation with most recent hospital discharge on 11/17/2018  Role Documenting the Problem One  Care Management CMcGrathfor Problem One  Active  [facility] Long Term Goal   Over the next 31 days, patient will  not experience unplanned hospital readmission,  as evidenced by patient reporting and review of EMR during [place] outreach  [place] Term Goal Start Date  11/20/19  Interventions for Problem One Long Term Goal  Discussed with patient her current clinical condition and confirmed that she has no current clinical concerns,  reviewed with patient recent and upcoming provider appointments,  SDOH updated,  quarterly summary/ care plan sent to PCP    [facility] CM Care Plan Problem Two     Most Recent Value  Care Plan Problem Two  Need for ongoing reinforcement of self-health management strategies for chronic disease state of CHF/ COPD, as evidenced by patient reporting and recent hospitalization for CHF exacerbation  Role Documenting the Problem Two  Care Management Coordinator  Care Plan for Problem Two  Active  [facility] CM Short Term Goal #1   Over the next 14 days, patient will attend scheduled cardiac surgeon office visit and will discuss plan for definitive surgical intervention to address need for MVR revision, as evidenced by patient reporting and collaboration with care providers as indicated during [facility] RN CM outreach  [facility] CM Short Term Goal #1 Start Date  11/20/19  [facility] CM Short Term Goal #1 Met Date   12/01/19 [Goal Met]  Interventions for Short Term Goal #2   Confirmed with patient that she attended recent cardiothoracic surgeon office visit and is able to verbalize plan of care for needed MVR surgery  [facility] CM Short Term Goal #2   Over the next 30 days, patient will verbalize plan to obtain needed intervention for MVR revision as evidenced by patient reporting during [facility] RN CM outreach [Goal extended today]  [facility] CM Short Term Goal #2 Start Date  12/01/19 [Goal extended today]  Interventions for Short Term Goal #2  Discussed with patient plan to obtain needed surgery for MVR revision/ replacement,  confirmed that there is not yet a definitive plan in place,  confirmed that patient has contact information for  cardiothoracic surgery provider, along with general plan to move toward surgical intervention     [name], RN, BSN, [place] Coordinator [facility] Care Management  [phone]

## 2019-12-04 ENCOUNTER — Other Ambulatory Visit: Payer: Self-pay | Admitting: Internal Medicine

## 2019-12-04 DIAGNOSIS — E7800 Pure hypercholesterolemia, unspecified: Secondary | ICD-10-CM

## 2019-12-11 ENCOUNTER — Ambulatory Visit (INDEPENDENT_AMBULATORY_CARE_PROVIDER_SITE_OTHER): Payer: Medicare Other | Admitting: Pharmacist

## 2019-12-11 ENCOUNTER — Telehealth: Payer: Self-pay | Admitting: Internal Medicine

## 2019-12-11 DIAGNOSIS — I5043 Acute on chronic combined systolic (congestive) and diastolic (congestive) heart failure: Secondary | ICD-10-CM | POA: Diagnosis not present

## 2019-12-11 DIAGNOSIS — J439 Emphysema, unspecified: Secondary | ICD-10-CM | POA: Diagnosis not present

## 2019-12-11 NOTE — Chronic Care Management (AMB) (Signed)
Chronic Care Management   Follow Up Note   12/11/2019 Name: Ashley [name] MRN: [id number] DOB: [birth year]  Referred by: [name], MD Reason for referral : Chronic Care Management (Medication Management)   Ashley [name] is a 69 y.o. year old female who is a primary care patient of [name], [name], MD. The CCM team was consulted for assistance with chronic disease management and care coordination needs.    Contacted patient for medication management review.   Review of patient status, including review of consultants reports, relevant laboratory and other test results, and collaboration with appropriate care team members and the patient's provider was performed as part of comprehensive patient evaluation and provision of chronic care management services.    SDOH (Social Determinants of Health) screening performed today: None. See Care Plan for related entries.   Outpatient Encounter Medications as of 12/11/2019  Medication Sig Note  . febuxostat (ULORIC) 40 MG tablet Take 1 tablet (40 mg total) by mouth daily.   . Fluticasone-Umeclidin-Vilant (TRELEGY ELLIPTA) 100-62.5-25 MCG/INH AEPB Inhale 1 puff into the lungs daily.   [name] albuterol (VENTOLIN HFA) 108 (90 Base) MCG/ACT inhaler INHALE 2 PUFFS BY MOUTH INTO THE LUNGS EVERY 6 HOURS AS NEEDED FOR WHEEZING (Patient taking differently: Inhale 2 puffs into the lungs every 6 (six) hours as needed for wheezing. )   . ALPRAZolam (XANAX) 0.5 MG tablet Take 0.5-1 tablets (0.25-0.5 mg total) by mouth at bedtime. TAKE ONE TABLET BY MOUTH AT BEDTIME AS NEEDED FOR ANXIETY (Patient taking differently: Take 0.25-0.5 mg by mouth at bedtime. ) 11/13/2019: Takes 1/2 tab QPM  . aspirin 81 MG tablet Take 81 mg by mouth daily.   [name] atorvastatin (LIPITOR) 40 MG tablet TAKE ONE TABLET BY MOUTH ONCE DAILY   . carvedilol (COREG) 6.25 MG tablet Take 1 tablet (6.25 mg total) by mouth 2 (two) times daily with a meal.   . clopidogrel (PLAVIX) 75 MG tablet Take 1  tablet (75 mg total) by mouth daily.   . cyanocobalamin (,VITAMIN B-12,) 1000 MCG/ML injection Inject 1 mL into the muscle monthly (Patient taking differently: Inject 1,000 mcg into the muscle every 30 (thirty) days. )   . diphenhydrAMINE (DIPHENHIST) 25 mg capsule Take 25 mg by mouth at bedtime as needed for allergies or sleep.    [name] escitalopram (LEXAPRO) 10 MG tablet Take 1 tablet (10 mg total) by mouth daily.   . ferrous sulfate 325 (65 FE) MG EC tablet Take 325 mg by mouth daily with breakfast.   . furosemide (LASIX) 40 MG tablet Take 1 tablet (40 mg total) by mouth 2 (two) times daily.   [name] ipratropium-albuterol (DUONEB) 0.5-2.5 (3) MG/3ML SOLN Take 3 mLs by nebulization every 6 (six) hours as needed.   . nitroGLYCERIN (NITROSTAT) 0.4 MG SL tablet Place 1 tablet (0.4 mg total) under the tongue every 5 (five) minutes as needed for chest pain.   [name] omeprazole (PRILOSEC) 40 MG capsule Take 1 capsule (40 mg total) by mouth daily.   . potassium chloride (KLOR-CON) 10 MEQ tablet Take 1 tablet (10 mEq total) by mouth daily.   . sacubitril-valsartan (ENTRESTO) 24-26 MG Take 1 tablet by mouth 2 (two) times daily.   [name] spironolactone (ALDACTONE) 25 MG tablet Take 0.5 tablets (12.5 mg total) by mouth daily.   . traZODone (DESYREL) 50 MG tablet Take 0.5-1 tablets (25-50 mg total) by mouth daily. One hour before bedtime (Patient taking differently: Take 50 mg by mouth daily. One hour before  bedtime)    No facility-administered encounter medications on file as of 12/11/2019.     Objective:   Goals Addressed            This Visit's Progress     Patient Stated   . "I want to stay out of the hospital" (pt-stated)       Current Barriers:  . Polypharmacy; complex patient with multiple comorbidities including HFrEF, COPD, CAD (hx NSTEMI, s/p CABG x3 and mitral valve repair in 2007), continued severe mitral valve regurgitation; barrett's esophagus, depression . Most recent admission 1/12-1/13/21.  . Today,  reports severe congestion and productive cough x 1.5 weeks, increased yellow sputum production, but no change in SOB. Temporarily relieved w/ OTC guaifenesin products.  . Self-manages medications by filling a BID pill box. Has Medicare Extra Help- all brand medications $8.95 and generic $3.60 for 30 or 90 day supply o COPD: Tx Trelegy daily + albuterol HFA PRN or Duonebs PRN; follows w/ Dr. Patsey Berthold;  o CHF: HFrEF/CAD/mitral valve; managed on Entresto 24/26 BID, spironolactone 12.5 mg BID, carvedilol 6.25 mg BID, furosemide 40 mg BID follows w/ Dr. Saralyn Pilar and HF clinic.  - Has begun seeing [facility] for discussion of mitral valve repair. Had appt tomorrow, but had to reschedule d/t current cold symptoms o Gout: last uric acid 10.3; started on Uloric 40 mg daily. Reports adherence and denies affordability concerns.  o Insomnia: notes taking trazodone 50 mg QPM, alprazolam 0.25 mg QPM, and diphenhydramine 25 mg QPM for sleep.  o Hx tobacco abuse: patient has been TOBACCO FREE since 06/19/2019. [name]   Pharmacist Clinical Goal(s):  [name] Over the next 90 days, patient will work with PharmD and provider towards optimized medication management  Interventions: . Comprehensive medication review performed, medication list updated in electronic medical record . Relayed patient's current symptoms and questions about treatment to PCP.  [name] Encouraged continued f/u with cardiology, HF, and CT surgery teams as scheduled to move process of mitral valve repair along as quickly as possible. [name] Congratulated patient for continued focus on her health   Patient Self Care Activities:  . Patient will take medications as prescribed . Patient will attend follow up appointments as scheduled.   Please see past updates related to this goal by clicking on the "Past Updates" button in the selected goal          Plan:  - Scheduled f/u call 01/12/20  [name] Darnelle Maffucci, PharmD, BCACP, [place] Pharmacist [place] [phone]

## 2019-12-11 NOTE — Telephone Encounter (Signed)
Per Dr. Derrel Nip pt has been scheduled for a virtual visit at 1:15pm. Pt is aware of appt date and time.

## 2019-12-11 NOTE — Telephone Encounter (Signed)
Please schedule her an appt .  It can be virtual. You can overbook it tomorrow as a 15 minute slot

## 2019-12-11 NOTE — Telephone Encounter (Signed)
Pt has a cough, congestion, nose and lips are chapped, she said its just a bad cold. She wants to know if Dr. Derrel Nip will call her in something or what she can take. She had covid test 3 weeks ago and she doesn't believe it is that. Please advise.

## 2019-12-11 NOTE — Telephone Encounter (Signed)
Patient reports 1.5 weeks of increased congestion and productive cough, w/ yellow phlegm production. More phlegm than usual. No increased SOB. Has been using Dayquil/Nyquil to help loosen congestion.   Had appointment tomorrow w/ [facility], but rescheduled for next Friday, since she is feeling so symptomatic.   She's wondering if she needs prednisone and/or ABX. Routing to PCP

## 2019-12-11 NOTE — Patient Instructions (Signed)
Visit Information  Goals Addressed            This Visit's Progress     Patient Stated   . "I want to stay out of the hospital" (pt-stated)       Current Barriers:  . Polypharmacy; complex patient with multiple comorbidities including HFrEF, COPD, CAD (hx NSTEMI, s/p CABG x3 and mitral valve repair in 2007), continued severe mitral valve regurgitation; barrett's esophagus, depression . Most recent admission 1/12-1/13/21.  . Today, reports severe congestion and productive cough x 1.5 weeks, increased yellow sputum production, but no change in SOB. Temporarily relieved w/ OTC guaifenesin products.  . Self-manages medications by filling a BID pill box. Has Medicare Extra Help- all brand medications $8.95 and generic $3.60 for 30 or 90 day supply o COPD: Tx Trelegy daily + albuterol HFA PRN or Duonebs PRN; follows w/ Dr. Patsey Berthold;  o CHF: HFrEF/CAD/mitral valve; managed on Entresto 24/26 BID, spironolactone 12.5 mg BID, carvedilol 6.25 mg BID, furosemide 40 mg BID follows w/ Dr. Saralyn Pilar and HF clinic.  - Has begun seeing [facility] for discussion of mitral valve repair. Had appt tomorrow, but had to reschedule d/t current cold symptoms o Gout: last uric acid 10.3; started on Uloric 40 mg daily. Reports adherence and denies affordability concerns.  o Insomnia: notes taking trazodone 50 mg QPM, alprazolam 0.25 mg QPM, and diphenhydramine 25 mg QPM for sleep.  o Hx tobacco abuse: patient has been TOBACCO FREE since 06/19/2019. [name]   Pharmacist Clinical Goal(s):  [name] Over the next 90 days, patient will work with PharmD and provider towards optimized medication management  Interventions: . Comprehensive medication review performed, medication list updated in electronic medical record . Relayed patient's current symptoms and questions about treatment to PCP.  [name] Encouraged continued f/u with cardiology, HF, and CT surgery teams as scheduled to move process of mitral valve repair along as quickly  as possible. [name] Congratulated patient for continued focus on her health   Patient Self Care Activities:  . Patient will take medications as prescribed . Patient will attend follow up appointments as scheduled.   Please see past updates related to this goal by clicking on the "Past Updates" button in the selected goal         The patient verbalized understanding of instructions provided today and declined a print copy of patient instruction materials.  Plan:  - Scheduled f/u call 01/12/20  [name] Darnelle Maffucci, PharmD, BCACP, CPP Clinical Pharmacist [place] [phone]

## 2019-12-12 ENCOUNTER — Ambulatory Visit (INDEPENDENT_AMBULATORY_CARE_PROVIDER_SITE_OTHER): Payer: Medicare Other | Admitting: Internal Medicine

## 2019-12-12 ENCOUNTER — Other Ambulatory Visit: Payer: Self-pay

## 2019-12-12 ENCOUNTER — Encounter: Payer: Self-pay | Admitting: Internal Medicine

## 2019-12-12 DIAGNOSIS — M25551 Pain in right hip: Secondary | ICD-10-CM

## 2019-12-12 DIAGNOSIS — G8929 Other chronic pain: Secondary | ICD-10-CM

## 2019-12-12 IMAGING — CR DG CHEST 2V
1 series · 2 of 2 positions shown · non-contrast
Comparison: Same day.

CLINICAL DATA: Shortness of breath, cough.

EXAM:
CHEST - 2 VIEW

[Series 1: dg chest 2 view · 0.14mm/px · 2 of 2 slices shown]
[im 1/2]
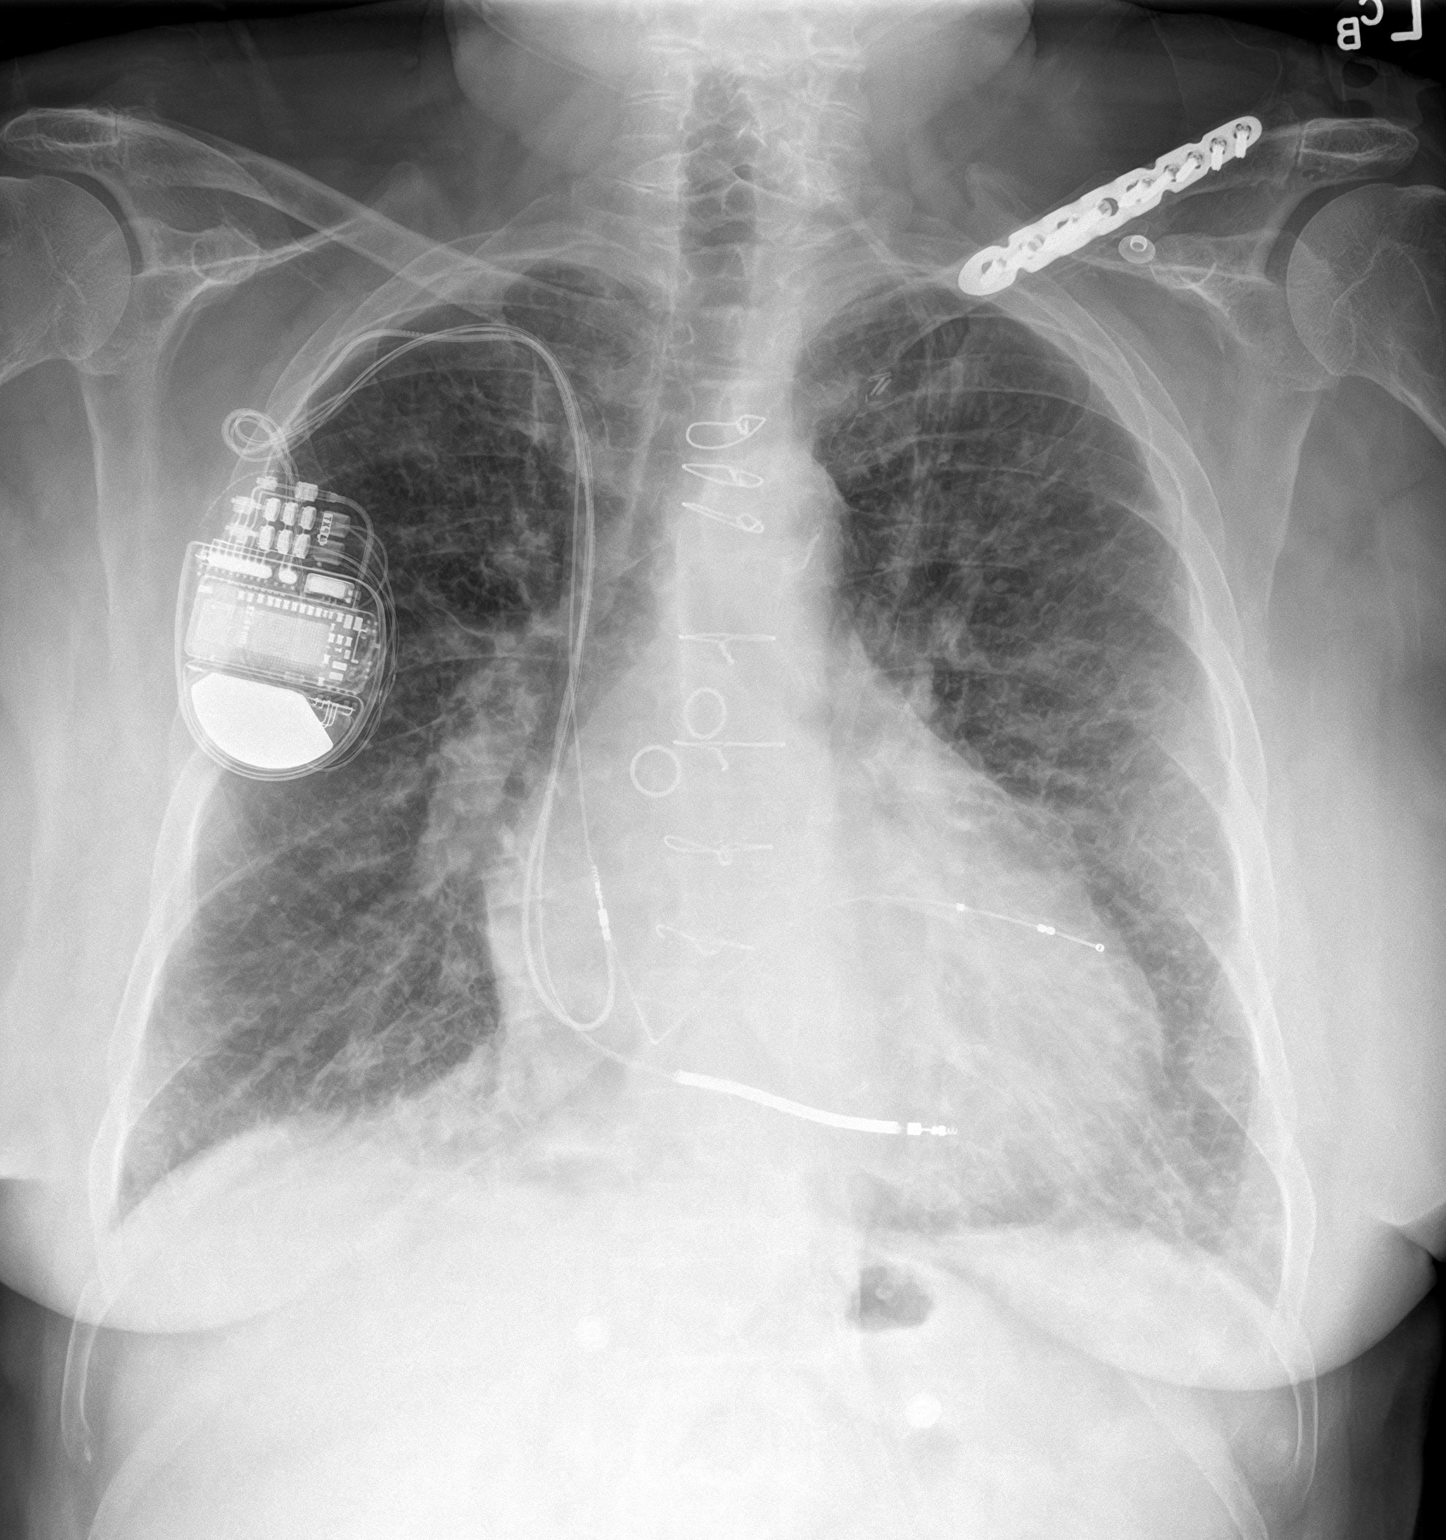
[im 2/2]
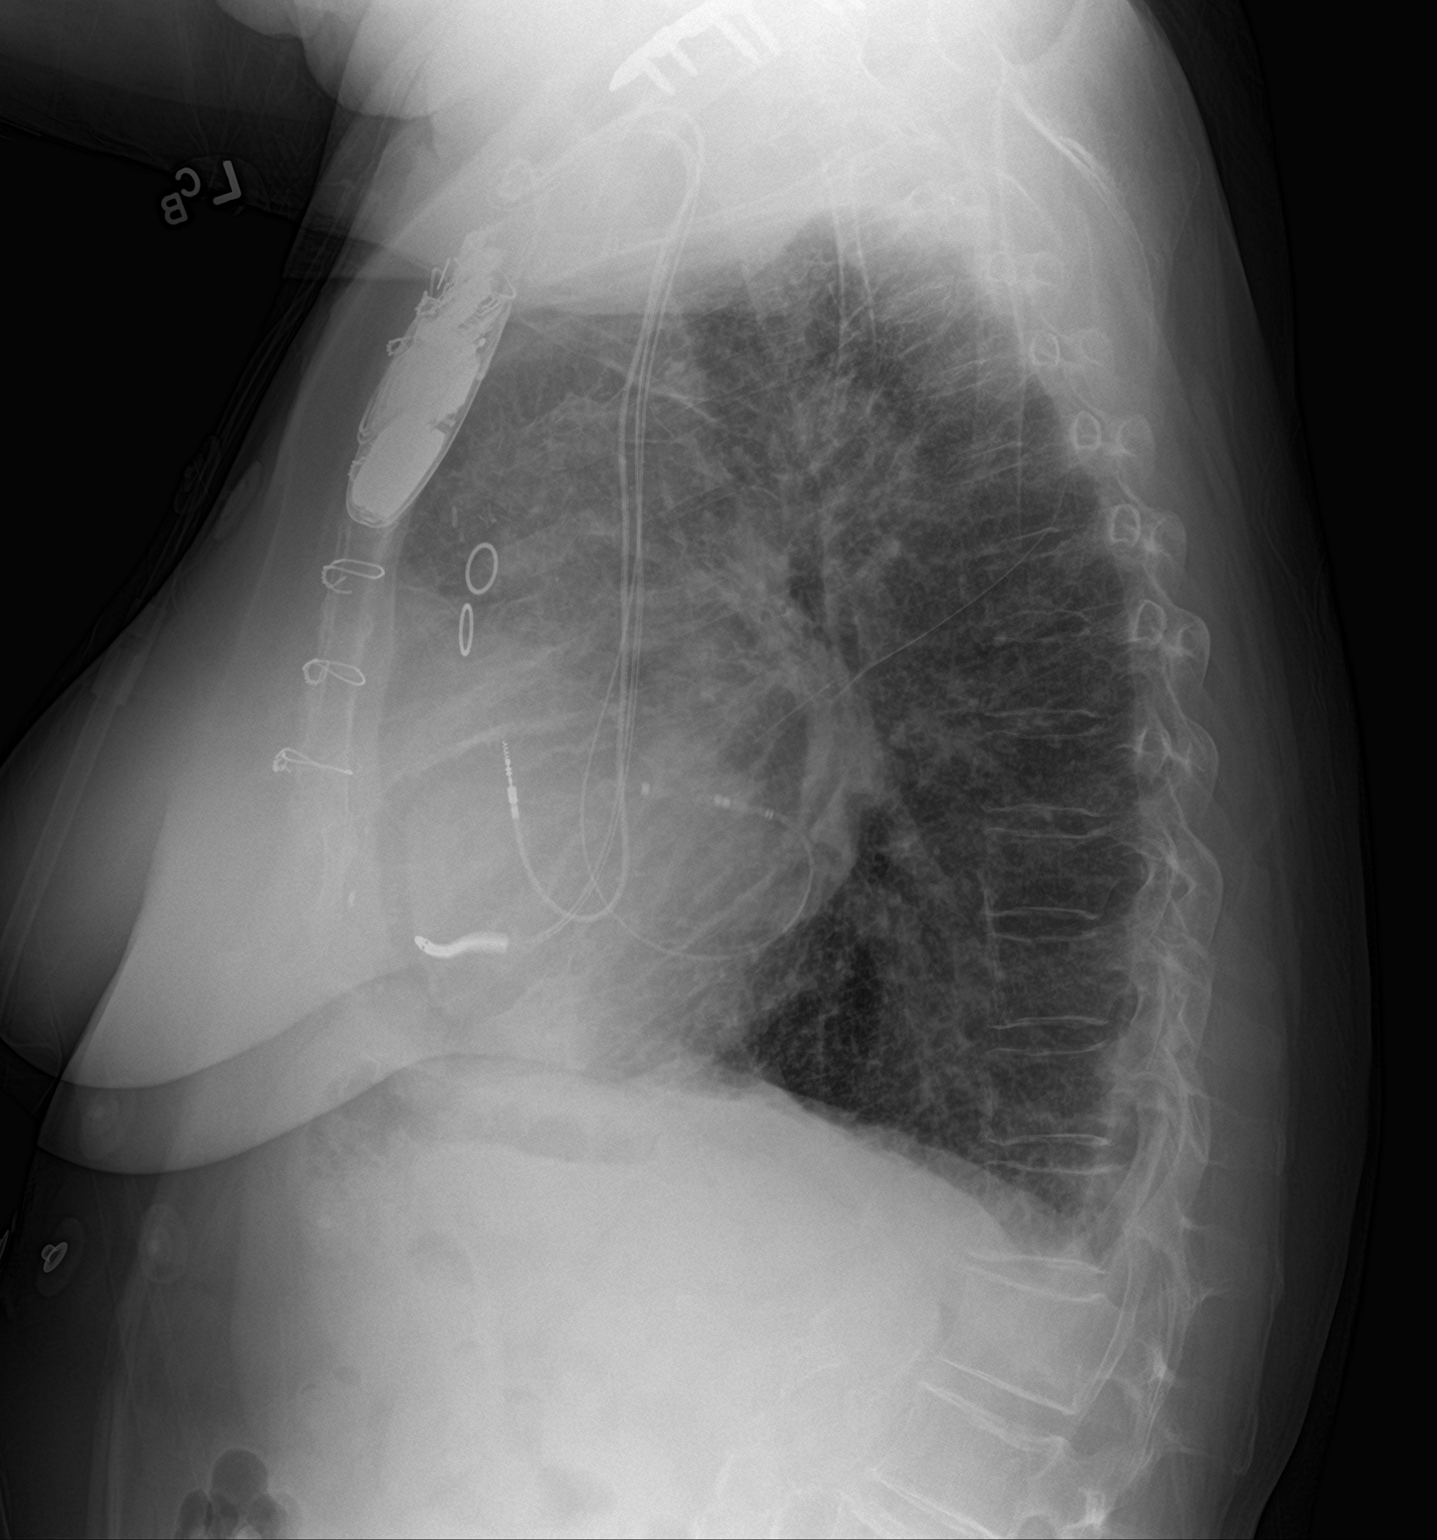

[2 of 2 positions shown; findings below may reference images not displayed]

FINDINGS: Stable cardiomegaly. Status post coronary bypass graft. No
pneumothorax is noted. Right-sided pacemaker is unchanged in
position. Stable diffuse interstitial densities are noted throughout
both lungs concerning for pulmonary edema with probable small
bilateral pleural effusions. Bony thorax is unremarkable.
IMPRESSION: Stable cardiomegaly with bilateral pulmonary edema and probable
small bilateral pleural effusions.

## 2019-12-12 MED ORDER — PREDNISONE 10 MG PO TABS: ORAL_TABLET | ORAL | 0 refills | Status: DC

## 2019-12-12 MED ORDER — DOXYCYCLINE HYCLATE 100 MG PO TABS: 100.0000 mg | ORAL_TABLET | Freq: Two times a day (BID) | ORAL | 0 refills | Status: DC

## 2019-12-12 NOTE — Patient Instructions (Signed)
Doxycycline and prednisone sent to [facility].    Use of oral  Probiotic is  advised for 3 weeks to mitigate risk of c dificile colitis   Chest x ray if no improvement in 7 days

## 2019-12-12 NOTE — Progress Notes (Signed)
Virtual Visit converted to Telephone Note   This visit type was conducted due to national recommendations for restrictions regarding the COVID-19 pandemic (e.g. social distancing).  This format is felt to be most appropriate for this patient at this time.  All issues noted in this document were discussed and addressed.  No physical exam was performed (except for noted visual exam findings with Video Visits).   I connected with@ on 12/12/19 at  1:15 PM EST by  telephone and verified that I am speaking with the correct person using two identifiers.  Interactive audio and video telecommunications were attempted between this provider and patient, however failed, due to patient having technical difficulties OR patient did not have access to video capability.  We continued and completed visit with audio only.   Location patient: home Location provider: work or home office Persons participating in the virtual visit: patient, provider  I discussed the limitations, risks, security and privacy concerns of performing an evaluation and management service by telephone and the availability of in person appointments. I also discussed with the patient that there may be a patient responsible charge related to this service. The patient expressed understanding and agreed to proceed.  Reason for visit: acute illness   HPI:  69 yr old female with advanced COPD.  CAD    Congestion and cough, for ten days,  No fevers , body aches are intermittent and limited to ribs.  No nausea , but has intermittent diarrhea that is chronic.  No recent use of antibiotics but finished prednisone on jan 22 for gout flare.  Sputum is occasionally discolored .  Wheezing at times.  Taking nyquil which helps .. using albuterol mdi,  atrovent duonebs and Trelegy inhaled medication ..  72 yr old grandson was ill last week ,  BUT HAS RECOVERED.     ROS: See pertinent positives and negatives per HPI.  Past Medical History:  Diagnosis Date   . AICD (automatic cardioverter/defibrillator) present    on right side  . Anemia   . CAD (coronary artery disease)    s/p CABG  . CHF (congestive heart failure) ([place])   . Chronic kidney disease    renal artery stenosis  . Colon cancer ([place])   . Depression   . GERD (gastroesophageal reflux disease)   . Hx of colonic polyps   . Hyperlipidemia   . Hypertension   . Mitral valve disorder    s/p mitral valve repair wth CABG  . Myocardial infarction ([place])   . Peripheral vascular disease ([place])   . Presence of permanent cardiac pacemaker    Pacemaker/ Defibrillator  . Tobacco abuse 10/11/2014    Past Surgical History:  Procedure Laterality Date  . arm surgery     fracture, has plates and screws  . CABG with mitral valve repair    . CARDIAC CATHETERIZATION    . COLON SURGERY     colon cancer  . COLONOSCOPY WITH PROPOFOL N/A 10/09/2016   Procedure: COLONOSCOPY WITH PROPOFOL;  Surgeon: Jonathon Bellows, MD;  Location: [facility] ENDOSCOPY;  Service: Endoscopy;  Laterality: N/A;  . COLONOSCOPY WITH PROPOFOL N/A 07/24/2018   Procedure: COLONOSCOPY WITH PROPOFOL;  Surgeon: Virgel Manifold, MD;  Location: [facility] ENDOSCOPY;  Service: Endoscopy;  Laterality: N/A;  . CORONARY ARTERY BYPASS GRAFT    . ENTEROSCOPY N/A 11/21/2018   Procedure: ENTEROSCOPY-BALLOON;  Surgeon: Jonathon Bellows, MD;  Location: [facility] ENDOSCOPY;  Service: Gastroenterology;  Laterality: N/A;  . ESOPHAGOGASTRODUODENOSCOPY (EGD) WITH PROPOFOL N/A 07/24/2018  Procedure: ESOPHAGOGASTRODUODENOSCOPY (EGD) WITH PROPOFOL;  Surgeon: Virgel Manifold, MD;  Location: [facility] ENDOSCOPY;  Service: Endoscopy;  Laterality: N/A;  . GIVENS CAPSULE STUDY N/A 08/16/2018   Procedure: GIVENS CAPSULE STUDY;  Surgeon: Virgel Manifold, MD;  Location: [facility] ENDOSCOPY;  Service: Endoscopy;  Laterality: N/A;  . pace maker defib  2016  . PERIPHERAL VASCULAR CATHETERIZATION N/A 10/23/2016   Procedure: Renal Angiography;  Surgeon: Algernon Huxley, MD;  Location:  [facility];  Service: Cardiovascular;  Laterality: N/A;  . RIGHT/LEFT HEART CATH AND CORONARY ANGIOGRAPHY N/A 08/26/2019   Procedure: RIGHT/LEFT HEART CATH AND CORONARY ANGIOGRAPHY;  Surgeon: Isaias Cowman, MD;  Location: [place] [facility];  Service: Cardiovascular;  Laterality: N/A;  . TOTAL ABDOMINAL HYSTERECTOMY  1999   history of abnormal pap    Family History  Problem Relation Age of Onset  . Arthritis Mother   . Cancer Mother        uterus cancer  . Hyperlipidemia Mother   . Hypertension Mother   . Heart disease Mother   . Diabetes Mother   . Hyperlipidemia Father   . Hypertension Father   . Heart disease Father   . Diabetes Father   . Cancer Sister        ovary cancer  . Diabetes Maternal Grandmother   . Hypertension Maternal Grandmother   . Arthritis Maternal Grandmother   . Hypertension Maternal Grandfather   . Hypertension Paternal Grandmother   . Hypertension Paternal Grandfather   . Heart disease Paternal Grandfather   . Heart disease Brother   . Kidney disease Brother   . Breast cancer Neg Hx     SOCIAL HX:  reports that she quit smoking about 5 months ago. Her smoking use included cigarettes. She smoked 0.25 packs per day. She has never used smokeless tobacco. She reports current alcohol use of about 2.0 standard drinks of alcohol per week. She reports that she does not use drugs.   Current Outpatient Medications:  .  albuterol (VENTOLIN HFA) 108 (90 Base) MCG/ACT inhaler, INHALE 2 PUFFS BY MOUTH INTO THE LUNGS EVERY 6 HOURS AS NEEDED FOR WHEEZING (Patient taking differently: Inhale 2 puffs into the lungs every 6 (six) hours as needed for wheezing. ), Disp: 54 g, Rfl: 2 .  ALPRAZolam (XANAX) 0.5 MG tablet, Take 0.5-1 tablets (0.25-0.5 mg total) by mouth at bedtime. TAKE ONE TABLET BY MOUTH AT BEDTIME AS NEEDED FOR ANXIETY (Patient taking differently: Take 0.25-0.5 mg by mouth at bedtime. ), Disp: 30 tablet, Rfl: 3 .  aspirin 81 MG tablet, Take  81 mg by mouth daily., Disp: , Rfl:  .  atorvastatin (LIPITOR) 40 MG tablet, TAKE ONE TABLET BY MOUTH ONCE DAILY, Disp: 90 tablet, Rfl: 1 .  carvedilol (COREG) 6.25 MG tablet, Take 1 tablet (6.25 mg total) by mouth 2 (two) times daily with a meal., Disp: 180 tablet, Rfl: 3 .  clopidogrel (PLAVIX) 75 MG tablet, Take 1 tablet (75 mg total) by mouth daily., Disp: 90 tablet, Rfl: 3 .  cyanocobalamin (,VITAMIN B-12,) 1000 MCG/ML injection, Inject 1 mL into the muscle monthly (Patient taking differently: Inject 1,000 mcg into the muscle every 30 (thirty) days. ), Disp: 10 mL, Rfl: 0 .  diphenhydrAMINE (DIPHENHIST) 25 mg capsule, Take 25 mg by mouth at bedtime as needed for allergies or sleep. , Disp: , Rfl:  .  escitalopram (LEXAPRO) 10 MG tablet, Take 1 tablet (10 mg total) by mouth daily., Disp: 90 tablet, Rfl: 1 .  febuxostat (ULORIC) 40 MG tablet, Take 1 tablet (40 mg total) by mouth daily., Disp: 30 tablet, Rfl: 5 .  ferrous sulfate 325 (65 FE) MG EC tablet, Take 325 mg by mouth daily with breakfast., Disp: , Rfl:  .  Fluticasone-Umeclidin-Vilant (TRELEGY ELLIPTA) 100-62.5-25 MCG/INH AEPB, Inhale 1 puff into the lungs daily., Disp: 180 each, Rfl: 2 .  furosemide (LASIX) 40 MG tablet, Take 1 tablet (40 mg total) by mouth 2 (two) times daily., Disp: 180 tablet, Rfl: 1 .  ipratropium-albuterol (DUONEB) 0.5-2.5 (3) MG/3ML SOLN, Take 3 mLs by nebulization every 6 (six) hours as needed., Disp: 360 mL, Rfl: 1 .  nitroGLYCERIN (NITROSTAT) 0.4 MG SL tablet, Place 1 tablet (0.4 mg total) under the tongue every 5 (five) minutes as needed for chest pain., Disp: 30 tablet, Rfl: 3 .  omeprazole (PRILOSEC) 40 MG capsule, Take 1 capsule (40 mg total) by mouth daily., Disp: 90 capsule, Rfl: 1 .  potassium chloride (KLOR-CON) 10 MEQ tablet, Take 1 tablet (10 mEq total) by mouth daily., Disp: 90 tablet, Rfl: 1 .  sacubitril-valsartan (ENTRESTO) 24-26 MG, Take 1 tablet by mouth 2 (two) times daily., Disp: 180 tablet, Rfl:  3 .  spironolactone (ALDACTONE) 25 MG tablet, Take 0.5 tablets (12.5 mg total) by mouth daily., Disp: 90 tablet, Rfl: 3 .  traZODone (DESYREL) 50 MG tablet, Take 0.5-1 tablets (25-50 mg total) by mouth daily. One hour before bedtime (Patient taking differently: Take 50 mg by mouth daily. One hour before bedtime), Disp: 90 tablet, Rfl: 3 .  doxycycline (VIBRA-TABS) 100 MG tablet, Take 1 tablet (100 mg total) by mouth 2 (two) times daily., Disp: 14 tablet, Rfl: 0 .  predniSONE (DELTASONE) 10 MG tablet, 6 tablets on Day 1 , then reduce by 1 tablet daily until gone, Disp: 21 tablet, Rfl: 0  EXAM:.  General impression: alert, cooperative and articulate.  No signs of being in distress  Lungs: speech is fluent sentence length suggests that patient is not short of breath and not punctuated by cough, sneezing or sniffing. [name]   Psych: affect normal.  speech is articulate and non pressured .  Denies suicidal thoughts    ASSESSMENT AND PLAN:  Discussed the following assessment and plan:  Chronic right hip pain   I discussed the assessment and treatment plan with the patient. The patient was provided an opportunity to ask questions and all were answered. The patient agreed with the plan and demonstrated an understanding of the instructions.   The patient was advised to call back or seek an in-person evaluation if the symptoms worsen or if the condition fails to improve as anticipated.  I provided  20 minutes of non-face-to-face time during this encounter reviewing patient's current problems and past procedures/imaging studies, providing counseling on the above mentioned problems , and coordination  of care .  [name], MD

## 2019-12-12 NOTE — Assessment & Plan Note (Signed)
Mild,  Symptoms present for the past 10 days  Due to contact with sick grandson who has recovered.  Doxycycline and prednisone sent to [facility].  Use of oral  Probiotic advised for 3 weeks to mitigate risk of c dificile colitis   Chest x ray if no improvement in 7 days

## 2019-12-16 ENCOUNTER — Other Ambulatory Visit: Payer: Self-pay | Admitting: *Deleted

## 2019-12-16 ENCOUNTER — Encounter: Payer: Self-pay | Admitting: *Deleted

## 2019-12-16 NOTE — Patient Outreach (Signed)
[facility]) [place] Telephone Outreach PCP completes Transition of Care follow up post-hospital discharge Post-(most recent) hospital discharge day # 27 without unplanned re-admission  12/16/2019  Ashley [name] [birth year] [id number]  Successful telephone outreach toDeborah Loyal [name], 69 y/o femaleoriginallyreferred to [place] by [facility] Liaison RN CM afterhospitalization July 21-25, 2043for acute on chronic CHF exacerbation.Patient has history including, but not limited to, combined CHF; CAD with previous MI/ CABG, and ICD; HTN/ HLD; GERD; COPD;history oftobacco use-patient has quit smoking since June 19, 2019.  Patient has multiple hospital re-admissions over the last year, most within 30 days of one another; all related to acute respiratory failure due to CHF exacerbation.  Patient was most recently hospitalized January 12-13, 2021 (33 days post-last hospital discharge),again with acute respiratory failure secondary to CHF exacerbation.Patient was discharged home to self-care without home health services in place.  HIPAA/ identity verified with patient;today,patient reports "doingmuch better" after recently reported upper respiratory symptoms; she confirms that she obtained and is taking as prescribed newly prescribed antibiotics/ prednisone/ pro-biotic, post- recent PCP visit on 12/12/19 to address her reported symptoms.  She denies pain and new/ recent falls.  Depression screening completed- no concerns identified.  Patient sounds to be in no distress throughout phone call today.  Patient further reports:  -- upcoming provider appointment 12/19/19 with cardiothoracic surgeon at [facility]: visit was re-scheduled around her recently reported upper respiratory symptoms; confirms that her family will continue to provide transportation and plans to attend as scheduled; reports that she knows she will be visiting with the  surgeon, but is not sure whether or not stress test (as previously recommended by cardiothoracic provider) will be completed during scheduled visit  -- no medication concerns: continues working with [facility] Pharmacist [name]; medication review with [name] recently completed  -- continues able to accurately and independently verbalize weight gain guidelines in setting of CHF, along with corresponding action plan ---- reviewed recent daily weights at home with patient and confirms that she continues this important practice ---- reports daily weights at home consistently "164- 166 lbs" with weight this morning of "165 lbs" ---- reiterated importance of promptly notifying care providers for any new concerns/ issues/ problems/ weight gain  Patient denies further issues, concerns, or problems today. I confirmed that patient hasmy direct phone number, the main [facility] CM office phone number, and the [facility] CM 24-hour nurse advice phone number should issues arise prior to next scheduled [place] outreach. Encouraged patient to contact me directly if needs, questions, issues, or concerns arise prior to next scheduled outreach next week, post- upcoming cardiothoracic surgical provider office visit; patient agreed to do so.  Plan:  Patient will take medications as prescribed and will attend all scheduled provider appointments  Patient will promptly notify care providers for any new concerns/ issues/ problems that arise  Patient will continue monitoring/ recording daily weightsand following action plan for CHF zones  Patient will continuenotsmoking  [place] outreach to continue with scheduled phone callnext week, post- upcoming cardiothoracic surgical office visit  [facility] CM Care Plan Problem One     Most Recent Value  Care Plan Problem One  High risk for hospital readmission related to/ as evidenced by multiple recent hospitalizations for Acute Respiratory Failure/ CHF exacerbation  with most recent hospital discharge on 11/17/2018  Role Documenting the Problem One  Care Management [place] for Problem One  Active  [facility] Long Term Goal   Over  the next 9 days, patient will not experience unplanned hospital readmission, as evidenced by patient reporting and review of EMR during [facility] RN CCM outreach [goal extended today]  [facility] Long Term Goal Start Date  12/16/19 [Goal extended today]  Interventions for Problem One Long Term Goal  Discussed with patient her current clinical condition and confirmed that she is feeling better after recently reported URI/ symptoms,  confirmed that she obtained and is taking newly prescribed antibiotic/ prednisone/ pro-biotic as ordered 12/12/19 during PCP appointment,  confirmed that patient has no current / new clinical concerns,  depression screening completed    [facility] CM Care Plan Problem Two     Most Recent Value  Care Plan Problem Two  Need for ongoing reinforcement of self-health management strategies for chronic disease state of CHF/ COPD, as evidenced by patient reporting and recent hospitalization for CHF exacerbation  Role Documenting the Problem Two  Care Management Coordinator  Care Plan for Problem Two  Active  [facility] CM Short Term Goal #2   Over the next 30 days, patient will verbalize plan to obtain needed intervention for MVR revision as evidenced by patient reporting during [facility] RN CM outreach  [facility] CM Short Term Goal #2 Start Date  12/01/19  Interventions for Short Term Goal #2  Confirmed that patient has attended all recent provider appointments and reviewed all recent appointments with her,  discussed upcoming provider appointment with cardiothoracic surgeon on 12/19/19 and confirmed that she plans to attend as scheduled and has reliable transportation as established through family members     [name], RN, BSN, [employer] Coordinator [facility] Care Management  [phone]

## 2019-12-18 ENCOUNTER — Emergency Department: Payer: Medicare Other

## 2019-12-18 ENCOUNTER — Inpatient Hospital Stay (HOSPITAL_COMMUNITY)
Admit: 2019-12-18 | Discharge: 2019-12-18 | Disposition: A | Discharge status: Home / Self Care | Payer: Medicare Other | Attending: Internal Medicine | Admitting: Internal Medicine

## 2019-12-18 ENCOUNTER — Other Ambulatory Visit: Payer: Self-pay | Admitting: *Deleted

## 2019-12-18 ENCOUNTER — Other Ambulatory Visit: Payer: Self-pay

## 2019-12-18 ENCOUNTER — Inpatient Hospital Stay
Admission: EM | Admit: 2019-12-18 | Discharge: 2019-12-21 | DRG: 291 | Disposition: A | Discharge status: Home / Self Care | Payer: Medicare Other | Attending: Internal Medicine | Admitting: Internal Medicine

## 2019-12-18 DIAGNOSIS — R0781 Pleurodynia: Secondary | ICD-10-CM

## 2019-12-18 DIAGNOSIS — Z833 Family history of diabetes mellitus: Secondary | ICD-10-CM

## 2019-12-18 DIAGNOSIS — I5023 Acute on chronic systolic (congestive) heart failure: Secondary | ICD-10-CM | POA: Diagnosis not present

## 2019-12-18 DIAGNOSIS — Z90710 Acquired absence of both cervix and uterus: Secondary | ICD-10-CM

## 2019-12-18 DIAGNOSIS — Z8349 Family history of other endocrine, nutritional and metabolic diseases: Secondary | ICD-10-CM

## 2019-12-18 DIAGNOSIS — I5043 Acute on chronic combined systolic (congestive) and diastolic (congestive) heart failure: Secondary | ICD-10-CM | POA: Diagnosis not present

## 2019-12-18 DIAGNOSIS — J441 Chronic obstructive pulmonary disease with (acute) exacerbation: Secondary | ICD-10-CM | POA: Diagnosis present

## 2019-12-18 DIAGNOSIS — I509 Heart failure, unspecified: Secondary | ICD-10-CM | POA: Diagnosis not present

## 2019-12-18 DIAGNOSIS — F329 Major depressive disorder, single episode, unspecified: Secondary | ICD-10-CM | POA: Diagnosis present

## 2019-12-18 DIAGNOSIS — E872 Acidosis, unspecified: Secondary | ICD-10-CM | POA: Diagnosis present

## 2019-12-18 DIAGNOSIS — I10 Essential (primary) hypertension: Secondary | ICD-10-CM | POA: Diagnosis present

## 2019-12-18 DIAGNOSIS — Z86010 Personal history of colonic polyps: Secondary | ICD-10-CM | POA: Diagnosis not present

## 2019-12-18 DIAGNOSIS — I130 Hypertensive heart and chronic kidney disease with heart failure and stage 1 through stage 4 chronic kidney disease, or unspecified chronic kidney disease: Secondary | ICD-10-CM | POA: Diagnosis not present

## 2019-12-18 DIAGNOSIS — N179 Acute kidney failure, unspecified: Secondary | ICD-10-CM | POA: Diagnosis not present

## 2019-12-18 DIAGNOSIS — K219 Gastro-esophageal reflux disease without esophagitis: Secondary | ICD-10-CM | POA: Diagnosis not present

## 2019-12-18 DIAGNOSIS — I340 Nonrheumatic mitral (valve) insufficiency: Secondary | ICD-10-CM

## 2019-12-18 DIAGNOSIS — Z7982 Long term (current) use of aspirin: Secondary | ICD-10-CM

## 2019-12-18 DIAGNOSIS — Z743 Need for continuous supervision: Secondary | ICD-10-CM | POA: Diagnosis not present

## 2019-12-18 DIAGNOSIS — I342 Nonrheumatic mitral (valve) stenosis: Secondary | ICD-10-CM | POA: Diagnosis not present

## 2019-12-18 DIAGNOSIS — Z7902 Long term (current) use of antithrombotics/antiplatelets: Secondary | ICD-10-CM

## 2019-12-18 DIAGNOSIS — R7303 Prediabetes: Secondary | ICD-10-CM | POA: Diagnosis present

## 2019-12-18 DIAGNOSIS — R0602 Shortness of breath: Secondary | ICD-10-CM | POA: Diagnosis not present

## 2019-12-18 DIAGNOSIS — Z881 Allergy status to other antibiotic agents status: Secondary | ICD-10-CM | POA: Diagnosis not present

## 2019-12-18 DIAGNOSIS — Z87891 Personal history of nicotine dependence: Secondary | ICD-10-CM

## 2019-12-18 DIAGNOSIS — Z8261 Family history of arthritis: Secondary | ICD-10-CM

## 2019-12-18 DIAGNOSIS — M109 Gout, unspecified: Secondary | ICD-10-CM | POA: Diagnosis not present

## 2019-12-18 DIAGNOSIS — R0689 Other abnormalities of breathing: Secondary | ICD-10-CM | POA: Diagnosis not present

## 2019-12-18 DIAGNOSIS — Z85038 Personal history of other malignant neoplasm of large intestine: Secondary | ICD-10-CM | POA: Diagnosis not present

## 2019-12-18 DIAGNOSIS — J9811 Atelectasis: Secondary | ICD-10-CM | POA: Diagnosis not present

## 2019-12-18 DIAGNOSIS — Z8249 Family history of ischemic heart disease and other diseases of the circulatory system: Secondary | ICD-10-CM

## 2019-12-18 DIAGNOSIS — J852 Abscess of lung without pneumonia: Secondary | ICD-10-CM

## 2019-12-18 DIAGNOSIS — I252 Old myocardial infarction: Secondary | ICD-10-CM | POA: Diagnosis not present

## 2019-12-18 DIAGNOSIS — Z20822 Contact with and (suspected) exposure to covid-19: Secondary | ICD-10-CM | POA: Diagnosis not present

## 2019-12-18 DIAGNOSIS — R0789 Other chest pain: Secondary | ICD-10-CM

## 2019-12-18 DIAGNOSIS — I739 Peripheral vascular disease, unspecified: Secondary | ICD-10-CM | POA: Diagnosis present

## 2019-12-18 DIAGNOSIS — Z8041 Family history of malignant neoplasm of ovary: Secondary | ICD-10-CM

## 2019-12-18 DIAGNOSIS — E875 Hyperkalemia: Secondary | ICD-10-CM | POA: Diagnosis not present

## 2019-12-18 DIAGNOSIS — Z7952 Long term (current) use of systemic steroids: Secondary | ICD-10-CM

## 2019-12-18 DIAGNOSIS — Z885 Allergy status to narcotic agent status: Secondary | ICD-10-CM | POA: Diagnosis not present

## 2019-12-18 DIAGNOSIS — R739 Hyperglycemia, unspecified: Secondary | ICD-10-CM | POA: Diagnosis present

## 2019-12-18 DIAGNOSIS — Z951 Presence of aortocoronary bypass graft: Secondary | ICD-10-CM

## 2019-12-18 DIAGNOSIS — I2510 Atherosclerotic heart disease of native coronary artery without angina pectoris: Secondary | ICD-10-CM | POA: Diagnosis not present

## 2019-12-18 DIAGNOSIS — R778 Other specified abnormalities of plasma proteins: Secondary | ICD-10-CM | POA: Diagnosis present

## 2019-12-18 DIAGNOSIS — Z841 Family history of disorders of kidney and ureter: Secondary | ICD-10-CM

## 2019-12-18 DIAGNOSIS — Z79899 Other long term (current) drug therapy: Secondary | ICD-10-CM

## 2019-12-18 DIAGNOSIS — E785 Hyperlipidemia, unspecified: Secondary | ICD-10-CM | POA: Diagnosis present

## 2019-12-18 DIAGNOSIS — J9621 Acute and chronic respiratory failure with hypoxia: Secondary | ICD-10-CM

## 2019-12-18 DIAGNOSIS — I160 Hypertensive urgency: Secondary | ICD-10-CM | POA: Diagnosis not present

## 2019-12-18 DIAGNOSIS — Z95810 Presence of automatic (implantable) cardiac defibrillator: Secondary | ICD-10-CM | POA: Diagnosis not present

## 2019-12-18 DIAGNOSIS — N1831 Chronic kidney disease, stage 3a: Secondary | ICD-10-CM | POA: Diagnosis present

## 2019-12-18 DIAGNOSIS — R069 Unspecified abnormalities of breathing: Secondary | ICD-10-CM | POA: Diagnosis not present

## 2019-12-18 DIAGNOSIS — J9601 Acute respiratory failure with hypoxia: Secondary | ICD-10-CM | POA: Diagnosis not present

## 2019-12-18 DIAGNOSIS — Z8049 Family history of malignant neoplasm of other genital organs: Secondary | ICD-10-CM

## 2019-12-18 LAB — CBC WITH DIFFERENTIAL/PLATELET
Abs Immature Granulocytes: 0.51 10*3/uL — ABNORMAL HIGH (ref 0.00–0.07)
Basophils Absolute: 0.2 10*3/uL — ABNORMAL HIGH (ref 0.0–0.1)
Basophils Relative: 1 %
Eosinophils Absolute: 0.1 10*3/uL (ref 0.0–0.5)
Eosinophils Relative: 1 %
HCT: 46.8 % — ABNORMAL HIGH (ref 36.0–46.0)
Hemoglobin: 15.6 g/dL — ABNORMAL HIGH (ref 12.0–15.0)
Immature Granulocytes: 3 %
Lymphocytes Relative: 32 %
Lymphs Abs: 5.1 10*3/uL — ABNORMAL HIGH (ref 0.7–4.0)
MCH: 32.4 pg (ref 26.0–34.0)
MCHC: 33.3 g/dL (ref 30.0–36.0)
MCV: 97.3 fL (ref 80.0–100.0)
Monocytes Absolute: 0.9 10*3/uL (ref 0.1–1.0)
Monocytes Relative: 6 %
Neutro Abs: 8.8 10*3/uL — ABNORMAL HIGH (ref 1.7–7.7)
Neutrophils Relative %: 57 %
Platelets: 402 10*3/uL — ABNORMAL HIGH (ref 150–400)
RBC: 4.81 MIL/uL (ref 3.87–5.11)
RDW: 14.2 % (ref 11.5–15.5)
Smear Review: NORMAL
WBC: 15.6 10*3/uL — ABNORMAL HIGH (ref 4.0–10.5)
nRBC: 0.1 % (ref 0.0–0.2)

## 2019-12-18 LAB — COMPREHENSIVE METABOLIC PANEL
ALT: 13 U/L (ref 0–44)
AST: 22 U/L (ref 15–41)
Albumin: 3.6 g/dL (ref 3.5–5.0)
Alkaline Phosphatase: 75 U/L (ref 38–126)
Anion gap: 18 — ABNORMAL HIGH (ref 5–15)
BUN: 37 mg/dL — ABNORMAL HIGH (ref 8–23)
CO2: 20 mmol/L — ABNORMAL LOW (ref 22–32)
Calcium: 9.2 mg/dL (ref 8.9–10.3)
Chloride: 96 mmol/L — ABNORMAL LOW (ref 98–111)
Creatinine, Ser: 1.73 mg/dL — ABNORMAL HIGH (ref 0.44–1.00)
GFR calc Af Amer: 35 mL/min — ABNORMAL LOW (ref >60)
GFR calc non Af Amer: 30 mL/min — ABNORMAL LOW (ref >60)
Glucose, Bld: 450 mg/dL — ABNORMAL HIGH (ref 70–99)
Potassium: 5.2 mmol/L — ABNORMAL HIGH (ref 3.5–5.1)
Sodium: 134 mmol/L — ABNORMAL LOW (ref 135–145)
Total Bilirubin: 0.8 mg/dL (ref 0.3–1.2)
Total Protein: 7 g/dL (ref 6.5–8.1)

## 2019-12-18 LAB — TROPONIN I (HIGH SENSITIVITY)
Troponin I (High Sensitivity): 35 ng/L — ABNORMAL HIGH (ref <18)
Troponin I (High Sensitivity): 33 ng/L — ABNORMAL HIGH (ref <18)

## 2019-12-18 LAB — BRAIN NATRIURETIC PEPTIDE: B Natriuretic Peptide: 4188 pg/mL — ABNORMAL HIGH (ref 0.0–100.0)

## 2019-12-18 LAB — PROCALCITONIN: Procalcitonin: <0.1 ng/mL

## 2019-12-18 LAB — GLUCOSE, CAPILLARY
Glucose-Capillary: 405 mg/dL — ABNORMAL HIGH (ref 70–99)
Glucose-Capillary: 150 mg/dL — ABNORMAL HIGH (ref 70–99)
Glucose-Capillary: 216 mg/dL — ABNORMAL HIGH (ref 70–99)
Glucose-Capillary: 192 mg/dL — ABNORMAL HIGH (ref 70–99)

## 2019-12-18 LAB — RESPIRATORY PANEL BY RT PCR (FLU A&B, COVID)
Influenza A by PCR: NEGATIVE
Influenza B by PCR: NEGATIVE
SARS Coronavirus 2 by RT PCR: NEGATIVE

## 2019-12-18 LAB — LACTIC ACID, PLASMA
Lactic Acid, Venous: 5.4 mmol/L (ref 0.5–1.9)
Lactic Acid, Venous: 2.2 mmol/L (ref 0.5–1.9)

## 2019-12-18 LAB — HEMOGLOBIN A1C
Hgb A1c MFr Bld: 6.7 % — ABNORMAL HIGH (ref 4.8–5.6)
Mean Plasma Glucose: 145.59 mg/dL

## 2019-12-18 LAB — ECHOCARDIOGRAM COMPLETE
Height: 63 in
Weight: 2656 oz

## 2019-12-18 LAB — POC SARS CORONAVIRUS 2 AG: SARS Coronavirus 2 Ag: NEGATIVE

## 2019-12-18 MED ORDER — CHLORHEXIDINE GLUCONATE CLOTH 2 % EX PADS
6.0000 | MEDICATED_PAD | Freq: Every day | CUTANEOUS | Status: DC
  Administered 2019-12-18 – 2019-12-21 (×4): 6 via TOPICAL

## 2019-12-18 MED ORDER — SACUBITRIL-VALSARTAN 24-26 MG PO TABS
1.0000 | ORAL_TABLET | Freq: Two times a day (BID) | ORAL | Status: DC
  Administered 2019-12-18 – 2019-12-20 (×5): 1 via ORAL
  Filled 2019-12-18 (×7): qty 1

## 2019-12-18 MED ORDER — ACETAMINOPHEN 325 MG PO TABS
650.0000 mg | ORAL_TABLET | ORAL | Status: DC | PRN
  Administered 2019-12-19: 23:00:00 650 mg via ORAL
  Filled 2019-12-18: qty 2

## 2019-12-18 MED ORDER — MORPHINE SULFATE (PF) 2 MG/ML IV SOLN
INTRAVENOUS | Status: AC
  Administered 2019-12-18: 20:00:00 2 mg via INTRAVENOUS
  Filled 2019-12-18: qty 1

## 2019-12-18 MED ORDER — FUROSEMIDE 10 MG/ML IJ SOLN
40.0000 mg | Freq: Once | INTRAMUSCULAR | Status: AC
  Administered 2019-12-18: 40 mg via INTRAVENOUS
  Filled 2019-12-18: qty 4

## 2019-12-18 MED ORDER — ONDANSETRON HCL 4 MG/2ML IJ SOLN
4.0000 mg | Freq: Once | INTRAMUSCULAR | Status: AC
  Administered 2019-12-18: 4 mg via INTRAVENOUS
  Filled 2019-12-18: qty 2

## 2019-12-18 MED ORDER — AZITHROMYCIN 500 MG PO TABS
500.0000 mg | ORAL_TABLET | Freq: Every day | ORAL | Status: DC
  Filled 2019-12-18: qty 1

## 2019-12-18 MED ORDER — MORPHINE SULFATE (PF) 2 MG/ML IV SOLN: 2.0000 mg | INTRAVENOUS | Status: AC

## 2019-12-18 MED ORDER — AZITHROMYCIN 250 MG PO TABS
250.0000 mg | ORAL_TABLET | Freq: Every day | ORAL | Status: DC
  Administered 2019-12-19: 250 mg via ORAL
  Filled 2019-12-18: qty 1

## 2019-12-18 MED ORDER — CYANOCOBALAMIN 1000 MCG/ML IJ SOLN
1000.0000 ug | INTRAMUSCULAR | Status: DC
  Administered 2019-12-18: 1000 ug via INTRAMUSCULAR
  Filled 2019-12-18: qty 1

## 2019-12-18 MED ORDER — TRAZODONE HCL 50 MG PO TABS
50.0000 mg | ORAL_TABLET | Freq: Every day | ORAL | Status: DC
Start: 2019-12-18 — End: 2019-12-21
  Administered 2019-12-18 – 2019-12-20 (×3): 50 mg via ORAL
  Filled 2019-12-18 (×3): qty 1

## 2019-12-18 MED ORDER — SODIUM CHLORIDE 0.9% FLUSH
3.0000 mL | Freq: Two times a day (BID) | INTRAVENOUS | Status: DC
  Administered 2019-12-18 – 2019-12-19 (×4): 3 mL via INTRAVENOUS
  Administered 2019-12-20: 21:00:00 10 mL via INTRAVENOUS
  Administered 2019-12-20 – 2019-12-21 (×2): 3 mL via INTRAVENOUS

## 2019-12-18 MED ORDER — ALPRAZOLAM 0.25 MG PO TABS
0.2500 mg | ORAL_TABLET | Freq: Every day | ORAL | Status: DC
  Administered 2019-12-18 – 2019-12-20 (×3): 0.5 mg via ORAL
  Filled 2019-12-18 (×3): qty 2

## 2019-12-18 MED ORDER — ONDANSETRON HCL 4 MG/2ML IJ SOLN: 4.0000 mg | Freq: Four times a day (QID) | INTRAMUSCULAR | Status: DC | PRN

## 2019-12-18 MED ORDER — HYDRALAZINE HCL 20 MG/ML IJ SOLN: 5.0000 mg | INTRAMUSCULAR | Status: DC | PRN

## 2019-12-18 MED ORDER — HYDRALAZINE HCL 50 MG PO TABS: 25.0000 mg | ORAL_TABLET | Freq: Three times a day (TID) | ORAL | Status: DC | PRN

## 2019-12-18 MED ORDER — FERROUS SULFATE 325 (65 FE) MG PO TABS
325.0000 mg | ORAL_TABLET | Freq: Every day | ORAL | Status: DC
  Administered 2019-12-19 – 2019-12-21 (×3): 325 mg via ORAL
  Filled 2019-12-18 (×3): qty 1

## 2019-12-18 MED ORDER — ATORVASTATIN CALCIUM 20 MG PO TABS
40.0000 mg | ORAL_TABLET | Freq: Every day | ORAL | Status: DC
  Administered 2019-12-19 – 2019-12-21 (×3): 40 mg via ORAL
  Filled 2019-12-18 (×3): qty 2

## 2019-12-18 MED ORDER — ALBUTEROL SULFATE (2.5 MG/3ML) 0.083% IN NEBU
2.5000 mg | INHALATION_SOLUTION | Freq: Once | RESPIRATORY_TRACT | Status: AC
  Administered 2019-12-18: 2.5 mg via RESPIRATORY_TRACT
  Filled 2019-12-18: qty 3

## 2019-12-18 MED ORDER — DIPHENHYDRAMINE HCL 25 MG PO CAPS
25.0000 mg | ORAL_CAPSULE | Freq: Every evening | ORAL | Status: DC | PRN
  Filled 2019-12-18: qty 1

## 2019-12-18 MED ORDER — ALBUTEROL SULFATE (2.5 MG/3ML) 0.083% IN NEBU: 2.5000 mg | INHALATION_SOLUTION | RESPIRATORY_TRACT | Status: DC | PRN

## 2019-12-18 MED ORDER — CARVEDILOL 6.25 MG PO TABS
6.2500 mg | ORAL_TABLET | Freq: Two times a day (BID) | ORAL | Status: DC
  Administered 2019-12-18 – 2019-12-21 (×6): 6.25 mg via ORAL
  Filled 2019-12-18 (×6): qty 1

## 2019-12-18 MED ORDER — LORAZEPAM 2 MG/ML IJ SOLN: 0.5000 mg | Freq: Three times a day (TID) | INTRAMUSCULAR | Status: DC | PRN

## 2019-12-18 MED ORDER — INSULIN ASPART 100 UNIT/ML ~~LOC~~ SOLN
0.0000 [IU] | Freq: Every day | SUBCUTANEOUS | Status: DC
  Administered 2019-12-20: 22:00:00 2 [IU] via SUBCUTANEOUS
  Filled 2019-12-18: qty 1

## 2019-12-18 MED ORDER — MAGNESIUM SULFATE 2 GM/50ML IV SOLN
2.0000 g | Freq: Once | INTRAVENOUS | Status: AC
  Administered 2019-12-18: 07:00:00 2 g via INTRAVENOUS

## 2019-12-18 MED ORDER — SODIUM CHLORIDE 0.9 % IV SOLN
2.0000 g | Freq: Once | INTRAVENOUS | Status: AC
  Administered 2019-12-18: 08:00:00 2 g via INTRAVENOUS
  Filled 2019-12-18: qty 2

## 2019-12-18 MED ORDER — UMECLIDINIUM-VILANTEROL 62.5-25 MCG/INH IN AEPB
1.0000 | INHALATION_SPRAY | Freq: Every day | RESPIRATORY_TRACT | Status: DC
  Administered 2019-12-19 – 2019-12-21 (×3): 1 via RESPIRATORY_TRACT
  Filled 2019-12-18: qty 14

## 2019-12-18 MED ORDER — PANTOPRAZOLE SODIUM 40 MG PO TBEC
40.0000 mg | DELAYED_RELEASE_TABLET | Freq: Every day | ORAL | Status: DC
  Administered 2019-12-19 – 2019-12-21 (×3): 40 mg via ORAL
  Filled 2019-12-18 (×3): qty 1

## 2019-12-18 MED ORDER — ASPIRIN EC 81 MG PO TBEC: 81.0000 mg | DELAYED_RELEASE_TABLET | Freq: Every day | ORAL | Status: DC

## 2019-12-18 MED ORDER — SODIUM CHLORIDE 0.9% FLUSH: 3.0000 mL | INTRAVENOUS | Status: DC | PRN

## 2019-12-18 MED ORDER — NITROGLYCERIN 0.4 MG SL SUBL: 0.4000 mg | SUBLINGUAL_TABLET | SUBLINGUAL | Status: DC | PRN

## 2019-12-18 MED ORDER — CLOPIDOGREL BISULFATE 75 MG PO TABS
75.0000 mg | ORAL_TABLET | Freq: Every day | ORAL | Status: DC
  Administered 2019-12-19 – 2019-12-21 (×3): 75 mg via ORAL
  Filled 2019-12-18 (×3): qty 1

## 2019-12-18 MED ORDER — ASPIRIN EC 81 MG PO TBEC
81.0000 mg | DELAYED_RELEASE_TABLET | Freq: Every day | ORAL | Status: DC
  Administered 2019-12-19 – 2019-12-21 (×3): 81 mg via ORAL
  Filled 2019-12-18 (×3): qty 1

## 2019-12-18 MED ORDER — SODIUM CHLORIDE 0.9 % IV BOLUS
1000.0000 mL | Freq: Once | INTRAVENOUS | Status: AC
  Administered 2019-12-18: 08:00:00 1000 mL via INTRAVENOUS

## 2019-12-18 MED ORDER — METHYLPREDNISOLONE SODIUM SUCC 40 MG IJ SOLR
40.0000 mg | Freq: Two times a day (BID) | INTRAMUSCULAR | Status: DC
  Administered 2019-12-19: 40 mg via INTRAVENOUS
  Filled 2019-12-18: qty 1

## 2019-12-18 MED ORDER — INSULIN ASPART 100 UNIT/ML ~~LOC~~ SOLN
0.0000 [IU] | Freq: Three times a day (TID) | SUBCUTANEOUS | Status: DC
  Administered 2019-12-18: 12:00:00 1 [IU] via SUBCUTANEOUS
  Administered 2019-12-18 – 2019-12-19 (×2): 3 [IU] via SUBCUTANEOUS
  Administered 2019-12-19: 12:00:00 5 [IU] via SUBCUTANEOUS
  Administered 2019-12-19: 18:00:00 3 [IU] via SUBCUTANEOUS
  Administered 2019-12-20: 18:00:00 2 [IU] via SUBCUTANEOUS
  Administered 2019-12-20: 12:00:00 3 [IU] via SUBCUTANEOUS
  Administered 2019-12-20: 08:00:00 2 [IU] via SUBCUTANEOUS
  Administered 2019-12-21: 13:00:00 5 [IU] via SUBCUTANEOUS
  Administered 2019-12-21: 08:00:00 2 [IU] via SUBCUTANEOUS
  Filled 2019-12-18 (×10): qty 1

## 2019-12-18 MED ORDER — DM-GUAIFENESIN ER 30-600 MG PO TB12
1.0000 | ORAL_TABLET | Freq: Two times a day (BID) | ORAL | Status: DC
  Administered 2019-12-18 – 2019-12-21 (×6): 1 via ORAL
  Filled 2019-12-18 (×7): qty 1

## 2019-12-18 MED ORDER — IPRATROPIUM-ALBUTEROL 0.5-2.5 (3) MG/3ML IN SOLN
3.0000 mL | RESPIRATORY_TRACT | Status: DC
  Administered 2019-12-18 – 2019-12-20 (×10): 3 mL via RESPIRATORY_TRACT
  Filled 2019-12-18 (×10): qty 3

## 2019-12-18 MED ORDER — INSULIN ASPART 100 UNIT/ML IV SOLN
3.0000 [IU] | Freq: Once | INTRAVENOUS | Status: DC
  Filled 2019-12-18: qty 0.03

## 2019-12-18 MED ORDER — FUROSEMIDE 10 MG/ML IJ SOLN
40.0000 mg | Freq: Two times a day (BID) | INTRAMUSCULAR | Status: DC
  Administered 2019-12-18 – 2019-12-19 (×4): 40 mg via INTRAVENOUS
  Filled 2019-12-18 (×4): qty 4

## 2019-12-18 MED ORDER — FLUTICASONE PROPIONATE HFA 110 MCG/ACT IN AERO
1.0000 | INHALATION_SPRAY | Freq: Every day | RESPIRATORY_TRACT | Status: DC
  Administered 2019-12-19 – 2019-12-21 (×3): 1 via RESPIRATORY_TRACT
  Filled 2019-12-18: qty 12

## 2019-12-18 MED ORDER — FAMOTIDINE IN NACL 20-0.9 MG/50ML-% IV SOLN: 20.0000 mg | Freq: Two times a day (BID) | INTRAVENOUS | Status: DC | PRN

## 2019-12-18 MED ORDER — METHYLPREDNISOLONE SODIUM SUCC 125 MG IJ SOLR
60.0000 mg | Freq: Two times a day (BID) | INTRAMUSCULAR | Status: DC
  Administered 2019-12-18: 17:00:00 60 mg via INTRAVENOUS
  Filled 2019-12-18: qty 2

## 2019-12-18 MED ORDER — SODIUM ZIRCONIUM CYCLOSILICATE 5 G PO PACK
5.0000 g | PACK | Freq: Once | ORAL | Status: DC
  Filled 2019-12-18 (×2): qty 1

## 2019-12-18 MED ORDER — FEBUXOSTAT 40 MG PO TABS
40.0000 mg | ORAL_TABLET | Freq: Every day | ORAL | Status: DC
  Administered 2019-12-19 – 2019-12-21 (×3): 40 mg via ORAL
  Filled 2019-12-18 (×4): qty 1

## 2019-12-18 MED ORDER — SODIUM CHLORIDE 0.9 % IV SOLN: 250.0000 mL | INTRAVENOUS | Status: DC | PRN

## 2019-12-18 MED ORDER — HEPARIN SODIUM (PORCINE) 5000 UNIT/ML IJ SOLN
5000.0000 [IU] | Freq: Three times a day (TID) | INTRAMUSCULAR | Status: DC
  Administered 2019-12-18 – 2019-12-21 (×8): 5000 [IU] via SUBCUTANEOUS
  Filled 2019-12-18 (×8): qty 1

## 2019-12-18 MED ORDER — ALBUTEROL SULFATE (2.5 MG/3ML) 0.083% IN NEBU: 2.5000 mg | INHALATION_SOLUTION | RESPIRATORY_TRACT | Status: DC

## 2019-12-18 MED ORDER — FLUTICASONE-UMECLIDIN-VILANT 100-62.5-25 MCG/INH IN AEPB: 1.0000 | INHALATION_SPRAY | Freq: Every day | RESPIRATORY_TRACT | Status: DC

## 2019-12-18 MED ORDER — ESCITALOPRAM OXALATE 10 MG PO TABS
10.0000 mg | ORAL_TABLET | Freq: Every day | ORAL | Status: DC
  Administered 2019-12-19 – 2019-12-21 (×3): 10 mg via ORAL
  Filled 2019-12-18 (×4): qty 1

## 2019-12-18 NOTE — Consult Note (Signed)
CRITICAL CARE PROGRESS NOTE    Name: Ashley [name] MRN: [id number] DOB: [birth year]     LOS: 0   SUBJECTIVE FINDINGS & SIGNIFICANT EVENTS   Patient description:  This is a pleasant 69 year old female with a history of advanced systolic CHF with EF of 30 to 35% status post AICD with severe CAD status post CABG, CKD stage III, history of colon cancer, COPD who was found at home with acute hypoxemia with oxygen saturation in the mid 80s.  She was provided with nebulized albuterol in the field on the way to the ER as well as BiPAP.  She was found to have elevated WBC count with lactic acidosis and a profoundly elevated BNP of over 4000 she was tested negative for COVID-19 also had abnormal electrolytes.  Her chest x-ray did show bilateral pulmonary edema.  Lines / Drains: PIVx2  Cultures / Sepsis markers: Respiratory and blood cultures  Antibiotics: Status post cefepime x1 now on Zithromax 500 daily   Protocols / Consultants: Hospitalist/PCCM  Tests / Events: Transthoracic echo     PAST MEDICAL HISTORY   Past Medical History:  Diagnosis Date  . AICD (automatic cardioverter/defibrillator) present    on right side  . Anemia   . CAD (coronary artery disease)    s/p CABG  . CHF (congestive heart failure) ([place])   . Chronic kidney disease    renal artery stenosis  . Colon cancer ([place])   . Depression   . GERD (gastroesophageal reflux disease)   . Hx of colonic polyps   . Hyperlipidemia   . Hypertension   . Mitral valve disorder    s/p mitral valve repair wth CABG  . Myocardial infarction ([place])   . Peripheral vascular disease ([place])   . Presence of permanent cardiac pacemaker    Pacemaker/ Defibrillator  . Tobacco abuse 10/11/2014     SURGICAL HISTORY   Past Surgical History:  Procedure  Laterality Date  . arm surgery     fracture, has plates and screws  . CABG with mitral valve repair    . CARDIAC CATHETERIZATION    . COLON SURGERY     colon cancer  . COLONOSCOPY WITH PROPOFOL N/A 10/09/2016   Procedure: COLONOSCOPY WITH PROPOFOL;  Surgeon: Jonathon Bellows, MD;  Location: [facility] ENDOSCOPY;  Service: Endoscopy;  Laterality: N/A;  . COLONOSCOPY WITH PROPOFOL N/A 07/24/2018   Procedure: COLONOSCOPY WITH PROPOFOL;  Surgeon: Virgel Manifold, MD;  Location: [facility] ENDOSCOPY;  Service: Endoscopy;  Laterality: N/A;  . CORONARY ARTERY BYPASS GRAFT    . ENTEROSCOPY N/A 11/21/2018   Procedure: ENTEROSCOPY-BALLOON;  Surgeon: Jonathon Bellows, MD;  Location: [facility] ENDOSCOPY;  Service: Gastroenterology;  Laterality: N/A;  . ESOPHAGOGASTRODUODENOSCOPY (EGD) WITH PROPOFOL N/A 07/24/2018   Procedure: ESOPHAGOGASTRODUODENOSCOPY (EGD) WITH PROPOFOL;  Surgeon: Virgel Manifold, MD;  Location: [facility] ENDOSCOPY;  Service: Endoscopy;  Laterality: N/A;  . GIVENS CAPSULE STUDY N/A 08/16/2018   Procedure: GIVENS CAPSULE STUDY;  Surgeon: Virgel Manifold, MD;  Location: [facility] ENDOSCOPY;  Service: Endoscopy;  Laterality: N/A;  . pace maker defib  2016  . PERIPHERAL VASCULAR CATHETERIZATION N/A 10/23/2016   Procedure: Renal Angiography;  Surgeon: Algernon Huxley, MD;  Location: [place] CV LAB;  Service: Cardiovascular;  Laterality: N/A;  . RIGHT/LEFT HEART CATH AND CORONARY ANGIOGRAPHY N/A 08/26/2019   Procedure: RIGHT/LEFT HEART CATH AND CORONARY ANGIOGRAPHY;  Surgeon: Isaias Cowman, MD;  Location: [place] CV LAB;  Service: Cardiovascular;  Laterality: N/A;  . TOTAL ABDOMINAL HYSTERECTOMY  1999   history of abnormal pap     FAMILY HISTORY   Family History  Problem Relation Age of Onset  . Arthritis Mother   . Cancer Mother        uterus cancer  . Hyperlipidemia Mother   . Hypertension Mother   . Heart disease Mother   . Diabetes Mother   . Hyperlipidemia Father   . Hypertension  Father   . Heart disease Father   . Diabetes Father   . Cancer Sister        ovary cancer  . Diabetes Maternal Grandmother   . Hypertension Maternal Grandmother   . Arthritis Maternal Grandmother   . Hypertension Maternal Grandfather   . Hypertension Paternal Grandmother   . Hypertension Paternal Grandfather   . Heart disease Paternal Grandfather   . Heart disease Brother   . Kidney disease Brother   . Breast cancer Neg Hx      SOCIAL HISTORY   Social History   Tobacco Use  . Smoking status: Former Smoker    Packs/day: 0.25    Types: Cigarettes    Quit date: 06/19/2019    Years since quitting: 0.4  . Smokeless tobacco: Never Used  . Tobacco comment: reports smoking 2-3 cigarettes/ day  Substance Use Topics  . Alcohol use: Yes    Alcohol/week: 2.0 standard drinks    Types: 2 Shots of liquor per week    Comment: occasion  . Drug use: No     MEDICATIONS   Current Medication:  Current Facility-Administered Medications:  .  0.9 %  sodium chloride infusion, 250 mL, Intravenous, PRN, Ivor Costa, MD .  acetaminophen (TYLENOL) tablet 650 mg, 650 mg, Oral, Q4H PRN, Ivor Costa, MD .  albuterol (PROVENTIL) (2.5 MG/3ML) 0.083% nebulizer solution 2.5 mg, 2.5 mg, Nebulization, Q4H PRN, Ivor Costa, MD .  ALPRAZolam Duanne Moron) tablet 0.25-0.5 mg, 0.25-0.5 mg, Oral, QHS, Ivor Costa, MD .  aspirin EC tablet 81 mg, 81 mg, Oral, Daily, Ivor Costa, MD .  atorvastatin (LIPITOR) tablet 40 mg, 40 mg, Oral, Q0600, Ivor Costa, MD .  azithromycin (ZITHROMAX) tablet 500 mg, 500 mg, Oral, Daily **FOLLOWED BY** [START ON 12/19/2019] azithromycin (ZITHROMAX) tablet 250 mg, 250 mg, Oral, Daily, Ivor Costa, MD .  carvedilol (COREG) tablet 6.25 mg, 6.25 mg, Oral, BID WC, Ivor Costa, MD, 6.25 mg at 12/18/19 1706 .  clopidogrel (PLAVIX) tablet 75 mg, 75 mg, Oral, Daily, Ivor Costa, MD .  cyanocobalamin ((VITAMIN B-12)) injection 1,000 mcg, 1,000 mcg, Intramuscular, Q30 days, Ivor Costa, MD, 1,000 mcg at  12/18/19 1707 .  dextromethorphan-guaiFENesin (Alex DM) 30-600 MG per 12 hr tablet 1 tablet, 1 tablet, Oral, BID, Ivor Costa, MD .  diphenhydrAMINE (BENADRYL) capsule 25 mg, 25 mg, Oral, QHS PRN, Ivor Costa, MD .  escitalopram (LEXAPRO) tablet 10 mg, 10 mg, Oral, Daily, Ivor Costa, MD .  famotidine (PEPCID) IVPB 20 mg premix, 20 mg, Intravenous, Q12H PRN, Ivor Costa, MD .  febuxostat (ULORIC) tablet 40 mg, 40 mg, Oral, Daily, Ivor Costa, MD .  Derrill Memo ON 12/19/2019] ferrous sulfate tablet 325 mg, 325 mg, Oral, Q breakfast, Ivor Costa, MD .  fluticasone (FLOVENT HFA) 110 MCG/ACT inhaler 1 puff, 1 puff, Inhalation, Daily **AND** umeclidinium-vilanterol (ANORO ELLIPTA) 62.5-25 MCG/INH 1 puff, 1 puff, Inhalation, Daily, Ivor Costa, MD .  furosemide (LASIX) injection 40 mg, 40 mg, Intravenous, BID, Ottie Glazier, MD, 40 mg at 12/18/19 1219 .  heparin injection 5,000 Units, 5,000 Units, Subcutaneous, Q8H, Ivor Costa, MD,  Stopped at 12/18/19 0945 .  hydrALAZINE (APRESOLINE) injection 5 mg, 5 mg, Intravenous, Q2H PRN, Ivor Costa, MD .  insulin aspart (novoLOG) injection 0-5 Units, 0-5 Units, Subcutaneous, QHS, Niu, Xilin, MD .  insulin aspart (novoLOG) injection 0-9 Units, 0-9 Units, Subcutaneous, TID WC, Ivor Costa, MD, 3 Units at 12/18/19 1707 .  insulin aspart (novoLOG) injection 3 Units, 3 Units, Intravenous, Once, Ivor Costa, MD, Stopped at 12/18/19 1111 .  ipratropium-albuterol (DUONEB) 0.5-2.5 (3) MG/3ML nebulizer solution 3 mL, 3 mL, Nebulization, Q4H, Ivor Costa, MD, 3 mL at 12/18/19 1612 .  LORazepam (ATIVAN) injection 0.5 mg, 0.5 mg, Intravenous, Q8H PRN, Ivor Costa, MD .  methylPREDNISolone sodium succinate (SOLU-MEDROL) 125 mg/2 mL injection 60 mg, 60 mg, Intravenous, Q12H, Ivor Costa, MD, 60 mg at 12/18/19 1706 .  nitroGLYCERIN (NITROSTAT) SL tablet 0.4 mg, 0.4 mg, Sublingual, Q5 min PRN, Ivor Costa, MD .  ondansetron [facility]) injection 4 mg, 4 mg, Intravenous, Q6H PRN, Ivor Costa, MD .   pantoprazole (PROTONIX) EC tablet 40 mg, 40 mg, Oral, QAC breakfast, Ivor Costa, MD .  sacubitril-valsartan (ENTRESTO) 24-26 mg per tablet, 1 tablet, Oral, BID, Ivor Costa, MD .  sodium chloride flush (NS) 0.9 % injection 3 mL, 3 mL, Intravenous, Q12H, Ivor Costa, MD, 3 mL at 12/18/19 1124 .  sodium chloride flush (NS) 0.9 % injection 3 mL, 3 mL, Intravenous, PRN, Ivor Costa, MD .  sodium zirconium cyclosilicate (LOKELMA) packet 5 g, 5 g, Oral, Once, Ivor Costa, MD .  traZODone (DESYREL) tablet 50 mg, 50 mg, Oral, Daily, Ivor Costa, MD    ALLERGIES   Vancomycin and Hydrocodone-acetaminophen    REVIEW OF SYSTEMS     10 point ROS conducted and is negative except as per subjective findings  PHYSICAL EXAMINATION   Vital Signs: Temp:  [97.7 F (36.5 C)-98.2 F (36.8 C)] 98.2 F (36.8 C) (02/11 1200) Pulse Rate:  [87-121] 121 (02/11 1715) Resp:  [20-35] 21 (02/11 1715) BP: (119-165)/(56-127) 136/108 (02/11 1000) SpO2:  [92 %-100 %] 94 % (02/11 1715) FiO2 (%):  [21 %] 21 % (02/11 1715) Weight:  [75.3 kg] 75.3 kg (02/11 0710)  GENERAL: Chronically ill-appearing HEAD: Normocephalic, atraumatic.  EYES: Pupils equal, round, reactive to light.  No scleral icterus.  MOUTH: Moist mucosal membrane. NECK: Supple. No thyromegaly. No nodules. No JVD.  PULMONARY: Crackles at the bases bilaterally CARDIOVASCULAR: S1 and S2. Regular rate and rhythm. No murmurs, rubs, or gallops.  GASTROINTESTINAL: Soft, nontender, non-distended. No masses. Positive bowel sounds. No hepatosplenomegaly.  MUSCULOSKELETAL: No swelling, clubbing, or edema.  NEUROLOGIC: Mild distress due to acute illness SKIN:intact,warm,dry   PERTINENT DATA     Infusions: . sodium chloride    . famotidine (PEPCID) IV     Scheduled Medications: . ALPRAZolam  0.25-0.5 mg Oral QHS  . aspirin EC  81 mg Oral Daily  . atorvastatin  40 mg Oral Q0600  . azithromycin  500 mg Oral Daily   Followed by  . [START ON 12/19/2019]  azithromycin  250 mg Oral Daily  . carvedilol  6.25 mg Oral BID WC  . clopidogrel  75 mg Oral Daily  . cyanocobalamin  1,000 mcg Intramuscular Q30 days  . dextromethorphan-guaiFENesin  1 tablet Oral BID  . escitalopram  10 mg Oral Daily  . febuxostat  40 mg Oral Daily  . [START ON 12/19/2019] ferrous sulfate  325 mg Oral Q breakfast  . fluticasone  1 puff Inhalation Daily   And  . umeclidinium-vilanterol  1  puff Inhalation Daily  . furosemide  40 mg Intravenous BID  . heparin  5,000 Units Subcutaneous Q8H  . insulin aspart  0-5 Units Subcutaneous QHS  . insulin aspart  0-9 Units Subcutaneous TID WC  . insulin aspart  3 Units Intravenous Once  . ipratropium-albuterol  3 mL Nebulization Q4H  . methylPREDNISolone (SOLU-MEDROL) injection  60 mg Intravenous Q12H  . pantoprazole  40 mg Oral QAC breakfast  . sacubitril-valsartan  1 tablet Oral BID  . sodium chloride flush  3 mL Intravenous Q12H  . sodium zirconium cyclosilicate  5 g Oral Once  . traZODone  50 mg Oral Daily   PRN Medications: sodium chloride, acetaminophen, albuterol, diphenhydrAMINE, famotidine (PEPCID) IV, hydrALAZINE, LORazepam, nitroGLYCERIN, ondansetron (ZOFRAN) IV, sodium chloride flush Hemodynamic parameters:   Intake/Output: No intake/output data recorded.  Ventilator  Settings: FiO2 (%):  [21 %] 21 %     LAB RESULTS:  Basic Metabolic Panel: Recent Labs  Lab 12/18/19 0716  NA 134*  K 5.2*  CL 96*  CO2 20*  GLUCOSE 450*  BUN 37*  CREATININE 1.73*  CALCIUM 9.2   Liver Function Tests: Recent Labs  Lab 12/18/19 0716  AST 22  ALT 13  ALKPHOS 75  BILITOT 0.8  PROT 7.0  ALBUMIN 3.6   No results for input(s): LIPASE, AMYLASE in the last 168 hours. No results for input(s): AMMONIA in the last 168 hours. CBC: Recent Labs  Lab 12/18/19 0716  WBC 15.6*  NEUTROABS 8.8*  HGB 15.6*  HCT 46.8*  MCV 97.3  PLT 402*   Cardiac Enzymes: No results for input(s): CKTOTAL, CKMB, CKMBINDEX, TROPONINI  in the last 168 hours. BNP: Invalid input(s): POCBNP CBG: Recent Labs  Lab 12/18/19 0719 12/18/19 1030 12/18/19 1540  GLUCAP 405* 150* 216*     IMAGING RESULTS:  Imaging: DG Chest Portable 1 View  Result Date: 12/18/2019 CLINICAL DATA:  Respiratory distress EXAM: PORTABLE CHEST 1 VIEW COMPARISON:  11/18/2019 FINDINGS: Cardiomegaly with biventricular pacer and CABG. Diffuse interstitial opacity with Kerley lines. No visible effusion or air leak. There is artifact from hardware. IMPRESSION: CHF. Electronically Signed   By: Monte Fantasia M.D.   On: 12/18/2019 07:41      ASSESSMENT AND PLAN    -Multidisciplinary rounds held today   Acute Hypoxic Respiratory Failure -Due to acute decompensated systolic CHF with concurrent chronic COPD -continue Bronchodilator Therapy -Wean Fio2 and PEEP as tolerated -will perform SAT/SBT when respiratory parameters are met -will decrease steroids to 40 twice daily today -chest physiotherapy - IS and BedPT    Acute decompensated systolic CHF -oxygen as needed -Lasix as tolerated -follow up cardiac enzymes as indicated ICU monitoring   Renal Failure-most likely due to ATN -follow chem 7 -follow UO -continue Foley Catheter-assess need daily  ID -continue IV abx as prescibed -follow up cultures  GI/Nutrition GI PROPHYLAXIS as indicated DIET-->TF's as tolerated Constipation protocol as indicated  ENDO - ICU hypoglycemic\Hyperglycemia protocol -check FSBS per protocol   ELECTROLYTES -follow labs as needed -replace as needed -pharmacy consultation   DVT/GI PRX ordered -SCDs  TRANSFUSIONS AS NEEDED MONITOR FSBS ASSESS the need for LABS as needed   Critical care provider statement:    Critical care time (minutes):  33   Critical care time was exclusive of:  Separately billable procedures and treating other patients   Critical care was necessary to treat or prevent imminent or life-threatening deterioration of the  following conditions:  Acute systolic CHF exacerbation, acute kidney injury, multiple  comorobid conditions   Critical care was time spent personally by me on the following activities:  Development of treatment plan with patient or surrogate, discussions with consultants, evaluation of patient's response to treatment, examination of patient, obtaining history from patient or surrogate, ordering and performing treatments and interventions, ordering and review of laboratory studies and re-evaluation of patient's condition.  I assumed direction of critical care for this patient from another provider in my specialty: no    This document was prepared using Dragon voice recognition software and may include unintentional dictation errors.    Ottie Glazier, M.D.  Division of [place]

## 2019-12-18 NOTE — ED Notes (Signed)
Date and time results received: 12/18/19 0800 (use smartphrase ".now" to insert current time)  Test: lactic Critical Value: 5.4  Name of Provider Notified: Siadecki  Orders Received? Or Actions Taken?: Orders Received - See Orders for details

## 2019-12-18 NOTE — Progress Notes (Signed)
PHARMACY -  BRIEF ANTIBIOTIC NOTE   Pharmacy has received consult(s) for cefepime from an ED provider. The patient's profile has been reviewed for ht/wt/allergies/indication/available labs.    One time order(s) placed for cefepime 2 g  Further antibiotics/pharmacy consults should be ordered by admitting physician if indicated.                       Thank you,  Tawnya Crook, PharmD 12/18/2019  9:20 AM

## 2019-12-18 NOTE — Patient Outreach (Signed)
[facility]) Care Management [place] Telephone Outreach Care Coordination  12/18/2019  [name] 24-Apr-1951 [id number]  [place] Telephone Outreach Care Coordination re:  Ashley [name], 69 y/o femaleor[place]ref[place] by [facility] Liaison RN CM afterhospitalization July 21-25, 2029for acute on chronic CHF exacerbation.Patient has history including, but not limited to, combined CHF; CAD with previous MI/ CABG, and ICD; HTN/ HLD; GERD; COPD;history oftobacco use-patient has quit smoking since June 19, 2019.  Patient has multiple hospital re-admissions over the last year, most within 30 days of one another; all related to acute respiratory failure due to CHF exacerbation.  Patient was most recently hospitalized January 12-13, 2021 (33 days post-last hospital discharge),again with acute respiratory failure secondary to CHF exacerbation.Patient was discharged home to self-care without home health services in place.  Noted from review of EMR that patient re-presented to hospital this morning, 12/18/2019, again with acute on chronic respiratory failure; this re-admission occurred 29 days after patient's last hospital discharge; patient is currently admitted in the ICU with no currently active plans for discharge.  To note, prior to her hospital re-admission this morning, patient had pending appointment tomorrow 2/12/21with cardiothoracic surgeon at [facility] for (hopeful) scheduling for surgical revision of previously placed mitral valve, which has been pending since October 2020.     Secure communication via EMR sent to [place] [facility] [place], notifying of patient's current hospital admission  Plan:  [place] RN CM will follow patient's progress while hospitalized and collaborate with [facility] liaison's for discharge disposition  [name], RN, BSN, Advance Care Management  [phone]

## 2019-12-18 NOTE — ED Provider Notes (Signed)
[facility] Emergency Department Provider Note ____________________________________________   First MD Initiated Contact with Patient 12/18/19 [phone]     (approximate)  I have reviewed the triage vital signs and the nursing notes.   HISTORY  Chief Complaint Shortness of Breath  Level 5 caveat: History of present illness limited due to respiratory distress.  HPI Ashley [name] is a 69 y.o. female with PMH as noted below including CHF and COPD (not on home oxygen) who presents with acute onset of shortness of breath around 2 hours ago.  The patient reports a nonproductive cough.  She states she was feeling fine yesterday.  Per EMS, O2 saturation was in the mid to high 80s on room air and the patient had very little air movement.  She was given duo nebs and Solu-Medrol in the field and placed on CPAP.  Past Medical History:  Diagnosis Date  . AICD (automatic cardioverter/defibrillator) present    on right side  . Anemia   . CAD (coronary artery disease)    s/p CABG  . CHF (congestive heart failure) ([place])   . Chronic kidney disease    renal artery stenosis  . Colon cancer ([place])   . Depression   . GERD (gastroesophageal reflux disease)   . Hx of colonic polyps   . Hyperlipidemia   . Hypertension   . Mitral valve disorder    s/p mitral valve repair wth CABG  . Myocardial infarction ([place])   . Peripheral vascular disease ([place])   . Presence of permanent cardiac pacemaker    Pacemaker/ Defibrillator  . Tobacco abuse 10/11/2014    Patient Active Problem List   Diagnosis Date Noted  . Hyperlipidemia   . COPD exacerbation ([place])   . Hyperkalemia   . Elevated troponin   . Acute renal failure superimposed on stage 3a chronic kidney disease ([place])   . Lactic acidosis   . Gouty arthritis of both feet 11/20/2019  . Mitral stenosis with insufficiency   . Hypertensive urgency   . Chronic renal failure, stage 3a   . Depression   . Acute on chronic  respiratory failure with hypoxia ([place]) 09/22/2019  . Hyperglycemia 09/22/2019  . Presence of permanent cardiac pacemaker   . Anemia   . Chronic kidney disease   . Hypertension   . Mitral valve disorder 08/25/2019  . Acute respiratory failure ([place]) 08/24/2019  . CHF (congestive heart failure) ([place]) 07/16/2019  . Cough with hemoptysis 06/09/2019  . Insomnia 05/07/2019  . Hypokalemia 03/23/2019  . Hyperglycemia, drug-induced 03/23/2019  . COVID-19 virus not detected 03/23/2019  . Respiratory failure ([place]) 03/16/2019  . AVM (arteriovenous malformation) of small bowel, acquired   . Anemia, iron deficiency 11/10/2018  . Acute on chronic systolic CHF (congestive heart failure) ([place]) 11/06/2018  . Rectal polyp   . Benign neoplasm of cecum   . Barrett's esophagus without dysplasia   . Stomach irritation   . Chronic diastolic heart failure ([place]) 07/03/2018  . HTN (hypertension) 07/03/2018  . 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 B12 deficiency anemia 06/28/2018  . Hypotension 05/11/2018  . Prediabetes 05/11/2018  . Personal history of colon cancer   . Benign neoplasm of descending colon   . Polyp of sigmoid colon   . Benign neoplasm of transverse colon   . Diverticulosis of large intestine without diverticulitis   . 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 Renal artery stenosis ([place]) 10/03/2016  . 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 Hospital  discharge follow-up 02/09/2015  . Cough in adult 10/21/2014  . Tobacco abuse counseling 10/11/2014  . Tobacco abuse 10/11/2014  . Chronic right hip pain 08/26/2014  . COPD with acute exacerbation ([place]) 06/18/2013  . Atherosclerosis of native artery of extremity with intermittent claudication ([place]) 06/18/2013  . Preoperative evaluation to rule out surgical contraindication 06/18/2013  . CAD (coronary artery disease) 06/01/2013  . GERD (gastroesophageal reflux disease) 06/01/2013  . Hypercholesterolemia  06/01/2013  . Tubular adenoma of colon 06/01/2013  . 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 arm surgery     fracture, has plates and screws  . CABG with mitral valve repair    . CARDIAC CATHETERIZATION    . COLON SURGERY     colon cancer  . COLONOSCOPY WITH PROPOFOL N/A 10/09/2016   Procedure: COLONOSCOPY WITH PROPOFOL;  Surgeon: Jonathon Bellows, MD;  Location: [facility] ENDOSCOPY;  Service: Endoscopy;  Laterality: N/A;  . COLONOSCOPY WITH PROPOFOL N/A 07/24/2018   Procedure: COLONOSCOPY WITH PROPOFOL;  Surgeon: Virgel Manifold, MD;  Location: [facility] ENDOSCOPY;  Service: Endoscopy;  Laterality: N/A;  . CORONARY ARTERY BYPASS GRAFT    . ENTEROSCOPY N/A 11/21/2018   Procedure: ENTEROSCOPY-BALLOON;  Surgeon: Jonathon Bellows, MD;  Location: [facility] ENDOSCOPY;  Service: Gastroenterology;  Laterality: N/A;  . ESOPHAGOGASTRODUODENOSCOPY (EGD) WITH PROPOFOL N/A 07/24/2018   Procedure: ESOPHAGOGASTRODUODENOSCOPY (EGD) WITH PROPOFOL;  Surgeon: Virgel Manifold, MD;  Location: [facility] ENDOSCOPY;  Service: Endoscopy;  Laterality: N/A;  . GIVENS CAPSULE STUDY N/A 08/16/2018   Procedure: GIVENS CAPSULE STUDY;  Surgeon: Virgel Manifold, MD;  Location: [facility] ENDOSCOPY;  Service: Endoscopy;  Laterality: N/A;  . pace maker defib  2016  . PERIPHERAL VASCULAR CATHETERIZATION N/A 10/23/2016   Procedure: Renal Angiography;  Surgeon: Algernon Huxley, MD;  Location: [facility];  Service: Cardiovascular;  Laterality: N/A;  . RIGHT/LEFT HEART CATH AND CORONARY ANGIOGRAPHY N/A 08/26/2019   Procedure: RIGHT/LEFT HEART CATH AND CORONARY ANGIOGRAPHY;  Surgeon: Isaias Cowman, MD;  Location: [facility];  Service: Cardiovascular;  Laterality: N/A;  . TOTAL ABDOMINAL HYSTERECTOMY  1999   history of abnormal pap    Prior to Admission medications   Medication Sig Start Date End Date Taking? Authorizing Provider  albuterol (VENTOLIN  HFA) 108 (90 Base) MCG/ACT inhaler INHALE 2 PUFFS BY MOUTH INTO THE LUNGS EVERY 6 HOURS AS NEEDED FOR WHEEZING Patient taking differently: Inhale 2 puffs into the lungs every 6 (six) hours as needed for wheezing.  09/04/19   Tyler Pita, MD  ALPRAZolam Duanne Moron) 0.5 MG tablet Take 0.5-1 tablets (0.25-0.5 mg total) by mouth at bedtime. TAKE ONE TABLET BY MOUTH AT BEDTIME AS NEEDED FOR ANXIETY Patient taking differently: Take 0.25-0.5 mg by mouth at bedtime.  09/05/19   [name], MD  aspirin 81 MG tablet Take 81 mg by mouth daily.    [provider]  atorvastatin (LIPITOR) 40 MG tablet TAKE ONE TABLET BY MOUTH ONCE DAILY 12/04/19   [name], MD  carvedilol (COREG) 6.25 MG tablet Take 1 tablet (6.25 mg total) by mouth 2 (two) times daily with a meal. 09/05/19   [name], MD  clopidogrel (PLAVIX) 75 MG tablet Take 1 tablet (75 mg total) by mouth daily. 09/05/19   Alisa Graff, FNP  cyanocobalamin (,VITAMIN B-12,) 1000 MCG/ML injection Inject 1 mL into the muscle monthly Patient taking differently: Inject 1,000 mcg into the muscle every  30 (thirty) days.  09/05/19   [name], MD  diphenhydrAMINE (DIPHENHIST) 25 mg capsule Take 25 mg by mouth at bedtime as needed for allergies or sleep.     [provider]  doxycycline (VIBRA-TABS) 100 MG tablet Take 1 tablet (100 mg total) by mouth 2 (two) times daily. 12/12/19   [name], MD  escitalopram (LEXAPRO) 10 MG tablet Take 1 tablet (10 mg total) by mouth daily. 09/05/19   [name], MD  febuxostat (ULORIC) 40 MG tablet Take 1 tablet (40 mg total) by mouth daily. 11/19/19   [name], MD  ferrous sulfate 325 (65 FE) MG EC tablet Take 325 mg by mouth daily with breakfast.    [provider]  Fluticasone-Umeclidin-Vilant (TRELEGY ELLIPTA) 100-62.5-25 MCG/INH AEPB Inhale 1 puff into the lungs daily. 09/04/19   Tyler Pita, MD  furosemide (LASIX) 40 MG tablet Take 1 tablet (40 mg  total) by mouth 2 (two) times daily. 11/19/19   [name], MD  ipratropium-albuterol (DUONEB) 0.5-2.5 (3) MG/3ML SOLN Take 3 mLs by nebulization every 6 (six) hours as needed. 11/19/19   [name], MD  nitroGLYCERIN (NITROSTAT) 0.4 MG SL tablet Place 1 tablet (0.4 mg total) under the tongue every 5 (five) minutes as needed for chest pain. 09/03/19   [name], MD  omeprazole (PRILOSEC) 40 MG capsule Take 1 capsule (40 mg total) by mouth daily. 09/05/19   [name], MD  potassium chloride (KLOR-CON) 10 MEQ tablet Take 1 tablet (10 mEq total) by mouth daily. 09/05/19   [name], MD  predniSONE (DELTASONE) 10 MG tablet 6 tablets on Day 1 , then reduce by 1 tablet daily until gone 12/12/19   [name], MD  sacubitril-valsartan (ENTRESTO) 24-26 MG Take 1 tablet by mouth 2 (two) times daily. 09/05/19   Alisa Graff, FNP  spironolactone (ALDACTONE) 25 MG tablet Take 0.5 tablets (12.5 mg total) by mouth daily. 09/05/19 09/04/20  Alisa Graff, FNP  traZODone (DESYREL) 50 MG tablet Take 0.5-1 tablets (25-50 mg total) by mouth daily. One hour before bedtime Patient taking differently: Take 50 mg by mouth daily. One hour before bedtime 09/05/19   [name], MD    Allergies Vancomycin and Hydrocodone-acetaminophen  Family History  Problem Relation Age of Onset  . Arthritis Mother   . Cancer Mother        uterus cancer  . Hyperlipidemia Mother   . Hypertension Mother   . Heart disease Mother   . Diabetes Mother   . Hyperlipidemia Father   . Hypertension Father   . Heart disease Father   . Diabetes Father   . Cancer Sister        ovary cancer  . Diabetes Maternal Grandmother   . Hypertension Maternal Grandmother   . Arthritis Maternal Grandmother   . Hypertension Maternal Grandfather   . Hypertension Paternal Grandmother   . Hypertension Paternal Grandfather   . Heart disease Paternal Grandfather   . Heart disease Brother   . Kidney disease Brother    . Breast cancer Neg Hx     Social History Social History   Tobacco Use  . Smoking status: Former Smoker    Packs/day: 0.25    Types: Cigarettes    Quit date: 06/19/2019    Years since quitting: 0.4  . Smokeless tobacco: Never Used  . Tobacco comment: reports smoking 2-3 cigarettes/ day  Substance Use Topics  .  Alcohol use: Yes    Alcohol/week: 2.0 standard drinks    Types: 2 Shots of liquor per week    Comment: occasion  . Drug use: No    Review of Systems Level 5 caveat: Unable to obtain review of systems due to respiratory distress    ____________________________________________   PHYSICAL EXAM:  VITAL SIGNS: ED Triage Vitals [12/18/19 0710]  Enc Vitals Group     BP      Pulse      Resp      Temp      Temp src      SpO2 100 %     Weight 166 lb (75.3 kg)     Height 5\' 3"  (1.6 m)     Head Circumference      Peak Flow      Pain Score 0     Pain Loc      Pain Edu?      Excl. in [place]?     Constitutional: Alert and oriented.  Moderate respiratory distress. Eyes: Conjunctivae are normal.   Head: Atraumatic. Nose: No congestion/rhinnorhea. Mouth/Throat: Mucous membranes are moist.   Neck: Normal range of motion.  Cardiovascular: Tachycardic, regular rhythm.  Good peripheral circulation. Respiratory: Increased respiratory effort.  Speaking in short sentences.  Diffuse wheezing and decreased air entry bilaterally. Gastrointestinal:  No distention.  Musculoskeletal: Extremities warm and well perfused.  Neurologic:  Normal speech and language. No gross focal neurologic deficits are appreciated.  Skin:  Skin is warm and dry. No rash noted. Psychiatric: Mood and affect are normal. Speech and behavior are normal.  ____________________________________________   LABS (all labs ordered are listed, but only abnormal results are displayed)  Labs Reviewed  COMPREHENSIVE METABOLIC PANEL - Abnormal; Notable for the following components:      Result Value   Sodium  134 (*)    Potassium 5.2 (*)    Chloride 96 (*)    CO2 20 (*)    Glucose, Bld 450 (*)    BUN 37 (*)    Creatinine, Ser 1.73 (*)    GFR calc non Af Amer 30 (*)    GFR calc Af Amer 35 (*)    Anion gap 18 (*)    All other components within normal limits  BRAIN NATRIURETIC PEPTIDE - Abnormal; Notable for the following components:   B Natriuretic Peptide 4,188.0 (*)    All other components within normal limits  LACTIC ACID, PLASMA - Abnormal; Notable for the following components:   Lactic Acid, Venous 5.4 (*)    All other components within normal limits  CBC WITH DIFFERENTIAL/PLATELET - Abnormal; Notable for the following components:   WBC 15.6 (*)    Hemoglobin 15.6 (*)    HCT 46.8 (*)    Platelets 402 (*)    Neutro Abs 8.8 (*)    Lymphs Abs 5.1 (*)    Basophils Absolute 0.2 (*)    Abs Immature Granulocytes 0.51 (*)    All other components within normal limits  GLUCOSE, CAPILLARY - Abnormal; Notable for the following components:   Glucose-Capillary 405 (*)    All other components within normal limits  TROPONIN I (HIGH SENSITIVITY) - Abnormal; Notable for the following components:   Troponin I (High Sensitivity) 35 (*)    All other components within normal limits  RESPIRATORY PANEL BY RT PCR (FLU A&B, COVID)  CULTURE, BLOOD (ROUTINE X 2)  CULTURE, BLOOD (ROUTINE X 2)  LACTIC ACID, PLASMA  URINALYSIS, COMPLETE (UACMP) WITH  MICROSCOPIC  HEMOGLOBIN A1C  LACTIC ACID, PLASMA  LACTIC ACID, PLASMA  POC SARS CORONAVIRUS 2 AG -  ED  POC SARS CORONAVIRUS 2 AG  TROPONIN I (HIGH SENSITIVITY)   ____________________________________________  EKG  ED ECG REPORT I, Arta Silence, the attending physician, personally viewed and interpreted this ECG.  Date: 12/18/2019 EKG Time: 07 21 Rate: 103 Rhythm: Sinus tachycardia QRS Axis: normal Intervals: RBBB, prolonged QTc ST/T Wave abnormalities: Nonspecific ST abnormalities Narrative Interpretation: Nonspecific abnormalities with no  evidence of acute ischemia  ____________________________________________  RADIOLOGY  CXR: Chronic findings consistent with CHF.  No focal infiltrate  ____________________________________________   PROCEDURES  Procedure(s) performed: No  Procedures  Critical Care performed: Yes  CRITICAL CARE Performed by: Arta Silence   Total critical care time: 45 minutes  Critical care time was exclusive of separately billable procedures and treating other patients.  Critical care was necessary to treat or prevent imminent or life-threatening deterioration.  Critical care was time spent personally by me on the following activities: development of treatment plan with patient and/or surrogate as well as nursing, discussions with consultants, evaluation of patient's response to treatment, examination of patient, obtaining history from patient or surrogate, ordering and performing treatments and interventions, ordering and review of laboratory studies, ordering and review of radiographic studies, pulse oximetry and re-evaluation of patient's condition. ____________________________________________   INITIAL IMPRESSION / ASSESSMENT AND PLAN / ED COURSE  Pertinent labs & imaging results that were available during my care of the patient were reviewed by me and considered in my medical decision making (see chart for details).  69 year old female with PMH as noted above including CHF and COPD (not on home oxygen) presents with relatively acute onset of shortness of breath and hypoxia.  On EMS arrival the patient was found to have an O2 saturation in the 80s with wheezing and decreased air movement.  She was treated with duo nebs and Solu-Medrol.  On arrival to the ED, the patient was on CPAP with continued significant respiratory distress.  She was placed on the monitor and transferred over to a BiPAP.  On exam, the patient demonstrates diffuse wheezing and somewhat improved air entry.  She is  maintaining an O2 saturation of 100% on the BiPAP.  Overall presentation based on the physical exam is more consistent with COPD exacerbation, however differential also includes CHF exacerbation, other cardiac etiology, pneumonia, COVID-19.  We will obtain a chest x-ray and lab work-up, continue BiPAP and reassess.  ----------------------------------------- 9:39 AM on 12/18/2019 -----------------------------------------  The patient is doing well on BiPAP and appears much more comfortable.  Chest x-ray reveals findings of CHF and the patient's BNP is elevated, however both these appear to be chronic findings and overall I suspect most likely a stronger acute component of COPD.  Lab work-up reveals elevated lactic acid and WBC count, however the patient has no focal symptoms to suggest a source of infection, no evidence of pneumonia, and a negative Covid test.  Overall I have a low suspicion for sepsis, and suspect that the elevated lactate is related to the patient's severe respiratory distress and work of breathing previously.  However, given these lab findings I initially started the patient on cefepime for infection of unknown source (she has an allergic reaction listed to vancomycin) and gave a fluid bolus, although keeping in mind her CHF.  We will recheck the lactate and then determine if she will need a full fluid bolus.  I discussed the patient with Dr. Blaine Hamper for  admission to the hospitalist service.  __________________________  Ashley [name] was evaluated in Emergency Department on 12/18/2019 for the symptoms described in the history of present illness. She was evaluated in the context of the global COVID-19 pandemic, which necessitated consideration that the patient might be at risk for infection with the SARS-CoV-2 virus that causes COVID-19. Institutional protocols and algorithms that pertain to the evaluation of patients at risk for COVID-19 are in a state of rapid change based on  information released by regulatory bodies including the CDC and federal and state organizations. These policies and algorithms were followed during the patient's care in the ED.  ____________________________________________   FINAL CLINICAL IMPRESSION(S) / ED DIAGNOSES  Final diagnoses:  Acute on chronic respiratory failure with hypoxia (HCC)      NEW MEDICATIONS STARTED DURING THIS VISIT:  New Prescriptions   No medications on file     Note:  This document was prepared using Dragon voice recognition software and may include unintentional dictation errors.    Arta Silence, MD 12/18/19 [phone]

## 2019-12-18 NOTE — Progress Notes (Signed)
*  PRELIMINARY RESULTS* Echocardiogram 2D Echocardiogram has been performed.  Sherrie Sport 12/18/2019, 2:04 PM

## 2019-12-18 NOTE — H&P (Addendum)
History and Physical    Ashley [name] DVV:[id number] DOB: [birth year] DOA: 12/18/2019  Referring MD/NP/PA:   PCP: [name], MD   Patient coming from:  The patient is coming from home.  At baseline, pt is independent for most of ADL.        Chief Complaint: SOB  HPI: Ashley [name] is a 69 y.o. female with medical history significant of hypertension, hyperlipidemia, prediabetes COPD, GERD, gout, depression with anxiety, former smoker, AICD placement, CAD, CABG, colon cancer, sCHF with EF 30-35%, CKD stage III, who presents with shortness of breath.  Patient states that she started having worsening shortness of breath since this morning, which has been progressively worsening.  She has dry cough, no chest pain, fever or chills.  She has nausea, no vomiting, diarrhea or abdominal pain.  No symptoms of UTI or unilateral weakness. Per EMS, O2 saturation was in the mid to high 80s on room air. She was given duo nebs and Solu-Medrol in the field by EMS and placed on CPAP. Pt has severe respiratory distress at arrival, BiPAP is started.  ED Course: pt was found to have WBC 15.6, lactic acid of 5.4, troponin 35, BNP 4180, pending urinalysis, negative Covid Ag test, negative RVP for Covid test, potassium 5.2, worsening renal function, temperature normal, blood pressure 130/58, tachycardia, tachypnea.  Chest x-ray showed pulmonary edema consistent with CHF.  Patient is admitted to stepdown as inpatient.  Review of Systems:   General: no fevers, chills, no body weight gain, has fatigue HEENT: no blurry vision, hearing changes or sore throat Respiratory: has dyspnea, coughing, wheezing CV: no chest pain, no palpitations GI: has nausea, no vomiting, abdominal pain, diarrhea, constipation GU: no dysuria, burning on urination, increased urinary frequency, hematuria  Ext: has trace leg edema Neuro: no unilateral weakness, numbness, or tingling, no vision change or hearing loss Skin: no rash,  no skin tear. MSK: No muscle spasm, no deformity, no limitation of range of movement in spin Heme: No easy bruising.  Travel history: No recent long distant travel.  Allergy:  Allergies  Allergen Reactions  . Vancomycin Nausea And Vomiting and Palpitations  . Hydrocodone-Acetaminophen Nausea Only    Past Medical History:  Diagnosis Date  . AICD (automatic cardioverter/defibrillator) present    on right side  . Anemia   . CAD (coronary artery disease)    s/p CABG  . CHF (congestive heart failure) ([place])   . Chronic kidney disease    renal artery stenosis  . Colon cancer ([place])   . Depression   . GERD (gastroesophageal reflux disease)   . Hx of colonic polyps   . Hyperlipidemia   . Hypertension   . Mitral valve disorder    s/p mitral valve repair wth CABG  . Myocardial infarction ([place])   . Peripheral vascular disease ([place])   . Presence of permanent cardiac pacemaker    Pacemaker/ Defibrillator  . Tobacco abuse 10/11/2014    Past Surgical History:  Procedure Laterality Date  . arm surgery     fracture, has plates and screws  . CABG with mitral valve repair    . CARDIAC CATHETERIZATION    . COLON SURGERY     colon cancer  . COLONOSCOPY WITH PROPOFOL N/A 10/09/2016   Procedure: COLONOSCOPY WITH PROPOFOL;  Surgeon: Jonathon Bellows, MD;  Location: [facility] ENDOSCOPY;  Service: Endoscopy;  Laterality: N/A;  . COLONOSCOPY WITH PROPOFOL N/A 07/24/2018   Procedure: COLONOSCOPY WITH PROPOFOL;  Surgeon: Virgel Manifold, MD;  Location: [facility] ENDOSCOPY;  Service: Endoscopy;  Laterality: N/A;  . CORONARY ARTERY BYPASS GRAFT    . ENTEROSCOPY N/A 11/21/2018   Procedure: ENTEROSCOPY-BALLOON;  Surgeon: Jonathon Bellows, MD;  Location: [facility] ENDOSCOPY;  Service: Gastroenterology;  Laterality: N/A;  . ESOPHAGOGASTRODUODENOSCOPY (EGD) WITH PROPOFOL N/A 07/24/2018   Procedure: ESOPHAGOGASTRODUODENOSCOPY (EGD) WITH PROPOFOL;  Surgeon: Virgel Manifold, MD;  Location: [facility] ENDOSCOPY;  Service:  Endoscopy;  Laterality: N/A;  . GIVENS CAPSULE STUDY N/A 08/16/2018   Procedure: GIVENS CAPSULE STUDY;  Surgeon: Virgel Manifold, MD;  Location: [facility] ENDOSCOPY;  Service: Endoscopy;  Laterality: N/A;  . pace maker defib  2016  . PERIPHERAL VASCULAR CATHETERIZATION N/A 10/23/2016   Procedure: Renal Angiography;  Surgeon: Algernon Huxley, MD;  Location: [place] [facility];  Service: Cardiovascular;  Laterality: N/A;  . RIGHT/LEFT HEART CATH AND CORONARY ANGIOGRAPHY N/A 08/26/2019   Procedure: RIGHT/LEFT HEART CATH AND CORONARY ANGIOGRAPHY;  Surgeon: Isaias Cowman, MD;  Location: [place] [facility];  Service: Cardiovascular;  Laterality: N/A;  . TOTAL ABDOMINAL HYSTERECTOMY  1999   history of abnormal pap    Social History:  reports that she quit smoking about 5 months ago. Her smoking use included cigarettes. She smoked 0.25 packs per day. She has never used smokeless tobacco. She reports current alcohol use of about 2.0 standard drinks of alcohol per week. She reports that she does not use drugs.  Family History:  Family History  Problem Relation Age of Onset  . Arthritis Mother   . Cancer Mother        uterus cancer  . Hyperlipidemia Mother   . Hypertension Mother   . Heart disease Mother   . Diabetes Mother   . Hyperlipidemia Father   . Hypertension Father   . Heart disease Father   . Diabetes Father   . Cancer Sister        ovary cancer  . Diabetes Maternal Grandmother   . Hypertension Maternal Grandmother   . Arthritis Maternal Grandmother   . Hypertension Maternal Grandfather   . Hypertension Paternal Grandmother   . Hypertension Paternal Grandfather   . Heart disease Paternal Grandfather   . Heart disease Brother   . Kidney disease Brother   . Breast cancer Neg Hx      Prior to Admission medications   Medication Sig Start Date End Date Taking? Authorizing Provider  albuterol (VENTOLIN HFA) 108 (90 Base) MCG/ACT inhaler INHALE 2 PUFFS BY MOUTH INTO THE  LUNGS EVERY 6 HOURS AS NEEDED FOR WHEEZING Patient taking differently: Inhale 2 puffs into the lungs every 6 (six) hours as needed for wheezing.  09/04/19   Tyler Pita, MD  ALPRAZolam Duanne Moron) 0.5 MG tablet Take 0.5-1 tablets (0.25-0.5 mg total) by mouth at bedtime. TAKE ONE TABLET BY MOUTH AT BEDTIME AS NEEDED FOR ANXIETY Patient taking differently: Take 0.25-0.5 mg by mouth at bedtime.  09/05/19   [name], MD  aspirin 81 MG tablet Take 81 mg by mouth daily.    [provider]  atorvastatin (LIPITOR) 40 MG tablet TAKE ONE TABLET BY MOUTH ONCE DAILY 12/04/19   [name], MD  carvedilol (COREG) 6.25 MG tablet Take 1 tablet (6.25 mg total) by mouth 2 (two) times daily with a meal. 09/05/19   [name], MD  clopidogrel (PLAVIX) 75 MG tablet Take 1 tablet (75 mg total) by mouth daily. 09/05/19   Alisa Graff, FNP  cyanocobalamin (,VITAMIN B-12,) 1000 MCG/ML injection Inject 1 mL  into the muscle monthly Patient taking differently: Inject 1,000 mcg into the muscle every 30 (thirty) days.  09/05/19   [name], MD  diphenhydrAMINE (DIPHENHIST) 25 mg capsule Take 25 mg by mouth at bedtime as needed for allergies or sleep.     [provider]  doxycycline (VIBRA-TABS) 100 MG tablet Take 1 tablet (100 mg total) by mouth 2 (two) times daily. 12/12/19   [name], MD  escitalopram (LEXAPRO) 10 MG tablet Take 1 tablet (10 mg total) by mouth daily. 09/05/19   [name], MD  febuxostat (ULORIC) 40 MG tablet Take 1 tablet (40 mg total) by mouth daily. 11/19/19   [name], MD  ferrous sulfate 325 (65 FE) MG EC tablet Take 325 mg by mouth daily with breakfast.    [provider]  Fluticasone-Umeclidin-Vilant (TRELEGY ELLIPTA) 100-62.5-25 MCG/INH AEPB Inhale 1 puff into the lungs daily. 09/04/19   Tyler Pita, MD  furosemide (LASIX) 40 MG tablet Take 1 tablet (40 mg total) by mouth 2 (two) times daily. 11/19/19   [name], MD    ipratropium-albuterol (DUONEB) 0.5-2.5 (3) MG/3ML SOLN Take 3 mLs by nebulization every 6 (six) hours as needed. 11/19/19   [name], MD  nitroGLYCERIN (NITROSTAT) 0.4 MG SL tablet Place 1 tablet (0.4 mg total) under the tongue every 5 (five) minutes as needed for chest pain. 09/03/19   [name], MD  omeprazole (PRILOSEC) 40 MG capsule Take 1 capsule (40 mg total) by mouth daily. 09/05/19   [name], MD  potassium chloride (KLOR-CON) 10 MEQ tablet Take 1 tablet (10 mEq total) by mouth daily. 09/05/19   [name], MD  predniSONE (DELTASONE) 10 MG tablet 6 tablets on Day 1 , then reduce by 1 tablet daily until gone 12/12/19   [name], MD  sacubitril-valsartan (ENTRESTO) 24-26 MG Take 1 tablet by mouth 2 (two) times daily. 09/05/19   Alisa Graff, FNP  spironolactone (ALDACTONE) 25 MG tablet Take 0.5 tablets (12.5 mg total) by mouth daily. 09/05/19 09/04/20  Alisa Graff, FNP  traZODone (DESYREL) 50 MG tablet Take 0.5-1 tablets (25-50 mg total) by mouth daily. One hour before bedtime Patient taking differently: Take 50 mg by mouth daily. One hour before bedtime 09/05/19   [name], MD    Physical Exam: Vitals:   12/18/19 0800 12/18/19 0830 12/18/19 0845 12/18/19 0900  BP: (!) 150/103 119/73  (!) 130/56  Pulse: 95 87 89 88  Resp: (!) 26 20 (!) 23 (!) 23  Temp:      TempSrc:      SpO2: 93% 94% 92% 93%  Weight:      Height:       General: Not in acute distress HEENT:       Eyes: PERRL, EOMI, no scleral icterus.       ENT: No discharge from the ears and nose, no pharynx injection, no tonsillar enlargement.        Neck: positive JVD, no bruit, no mass felt. Heme: No neck lymph node enlargement. Cardiac: S1/S2, RRR, No murmurs, No gallops or rubs. Respiratory: Decreased air movement bilaterally, has diffused rhonchi and mild wheezing bilaterally. GI: Soft, nondistended, nontender, no rebound pain, no organomegaly, BS present. GU: No  hematuria Ext: has trace leg edema bilaterally. 2+DP/PT pulse bilaterally. Musculoskeletal: No joint deformities, No joint redness or warmth, no limitation of ROM in spin. Skin: No rashes.  Neuro: Alert, oriented X3,  cranial nerves II-XII grossly intact, moves all extremities normally.  Psych: Patient is not psychotic, no suicidal or hemocidal ideation.  Labs on Admission: I have personally reviewed following labs and imaging studies  CBC: Recent Labs  Lab 12/18/19 0716  WBC 15.6*  NEUTROABS 8.8*  HGB 15.6*  HCT 46.8*  MCV 97.3  PLT 294*   Basic Metabolic Panel: Recent Labs  Lab 12/18/19 0716  NA 134*  K 5.2*  CL 96*  CO2 20*  GLUCOSE 450*  BUN 37*  CREATININE 1.73*  CALCIUM 9.2   GFR: Estimated Creatinine Clearance: 31.1 mL/min (A) (by C-G formula based on SCr of 1.73 mg/dL (H)). Liver Function Tests: Recent Labs  Lab 12/18/19 0716  AST 22  ALT 13  ALKPHOS 75  BILITOT 0.8  PROT 7.0  ALBUMIN 3.6   No results for input(s): LIPASE, AMYLASE in the last 168 hours. No results for input(s): AMMONIA in the last 168 hours. Coagulation Profile: No results for input(s): INR, PROTIME in the last 168 hours. Cardiac Enzymes: No results for input(s): CKTOTAL, CKMB, CKMBINDEX, TROPONINI in the last 168 hours. BNP (last 3 results) No results for input(s): PROBNP in the last 8760 hours. HbA1C: No results for input(s): HGBA1C in the last 72 hours. CBG: Recent Labs  Lab 12/18/19 0719  GLUCAP 405*   Lipid Profile: No results for input(s): CHOL, HDL, LDLCALC, TRIG, CHOLHDL, LDLDIRECT in the last 72 hours. Thyroid Function Tests: No results for input(s): TSH, T4TOTAL, FREET4, T3FREE, THYROIDAB in the last 72 hours. Anemia Panel: No results for input(s): VITAMINB12, FOLATE, FERRITIN, TIBC, IRON, RETICCTPCT in the last 72 hours. Urine analysis:    Component Value Date/Time   COLORURINE STRAW (A) 11/18/2019 0244   APPEARANCEUR CLEAR (A) 11/18/2019 0244   APPEARANCEUR  Clear 08/19/2014 0025   LABSPEC 1.008 11/18/2019 0244   LABSPEC 1.009 08/19/2014 0025   PHURINE 6.0 11/18/2019 0244   GLUCOSEU 50 (A) 11/18/2019 0244   GLUCOSEU 50 mg/dL 08/19/2014 0025   HGBUR SMALL (A) 11/18/2019 0244   BILIRUBINUR NEGATIVE 11/18/2019 0244   BILIRUBINUR Negative 08/19/2014 0025   KETONESUR NEGATIVE 11/18/2019 0244   PROTEINUR NEGATIVE 11/18/2019 0244   NITRITE NEGATIVE 11/18/2019 0244   LEUKOCYTESUR NEGATIVE 11/18/2019 0244   LEUKOCYTESUR Negative 08/19/2014 0025   Sepsis Labs: @LABRCNTIP (procalcitonin:4,lacticidven:4) ) Recent Results (from the past 240 hour(s))  Respiratory Panel by RT PCR (Flu A&B, Covid) - Nasopharyngeal Swab     Status: None   Collection Time: 12/18/19  7:17 AM   Specimen: Nasopharyngeal Swab  Result Value Ref Range Status   SARS Coronavirus 2 by RT PCR NEGATIVE NEGATIVE Final    Comment: (NOTE) SARS-CoV-2 target nucleic acids are NOT DETECTED. The SARS-CoV-2 RNA is generally detectable in upper respiratoy specimens during the acute phase of infection. The lowest concentration of SARS-CoV-2 viral copies this assay can detect is 131 copies/mL. A negative result does not preclude SARS-Cov-2 infection and should not be used as the sole basis for treatment or other patient management decisions. A negative result may occur with  improper specimen collection/handling, submission of specimen other than nasopharyngeal swab, presence of viral mutation(s) within the areas targeted by this assay, and inadequate number of viral copies (<131 copies/mL). A negative result must be combined with clinical observations, patient history, and epidemiological information. The expected result is Negative. Fact Sheet for Patients:  PinkCheek.be Fact Sheet for Healthcare Providers:  GravelBags.it This test is not yet ap proved or cleared by the [place] and  has been authorized for detection  and/or diagnosis of SARS-CoV-2 by FDA under an Emergency Use Authorization (EUA). This EUA will remain  in effect (meaning this test can be used) for the duration of the COVID-19 declaration under Section 564(b)(1) of the Act, 21 U.S.C. section 360bbb-3(b)(1), unless the authorization is terminated or revoked sooner.    Influenza A by PCR NEGATIVE NEGATIVE Final   Influenza B by PCR NEGATIVE NEGATIVE Final    Comment: (NOTE) The Xpert Xpress SARS-CoV-2/FLU/RSV assay is intended as an aid in  the diagnosis of influenza from Nasopharyngeal swab specimens and  should not be used as a sole basis for treatment. Nasal washings and  aspirates are unacceptable for Xpert Xpress SARS-CoV-2/FLU/RSV  testing. Fact Sheet for Patients: PinkCheek.be Fact Sheet for Healthcare Providers: GravelBags.it This test is not yet approved or cleared by the [place] FDA and  has been authorized for detection and/or diagnosis of SARS-CoV-2 by  FDA under an Emergency Use Authorization (EUA). This EUA will remain  in effect (meaning this test can be used) for the duration of the  Covid-19 declaration under Section 564(b)(1) of the Act, 21  U.S.C. section 360bbb-3(b)(1), unless the authorization is  terminated or revoked. Performed at [facility], [address] [place], [place] [address]      Radiological Exams on Admission: DG Chest Portable 1 View  Result Date: 12/18/2019 CLINICAL DATA:  Respiratory distress EXAM: PORTABLE CHEST 1 VIEW COMPARISON:  11/18/2019 FINDINGS: Cardiomegaly with biventricular pacer and CABG. Diffuse interstitial opacity with Kerley lines. No visible effusion or air leak. There is artifact from hardware. IMPRESSION: CHF. Electronically Signed   By: Monte Fantasia M.D.   On: 12/18/2019 07:41     EKG: Independently reviewed.  Paced rhythm, QTC 538  Assessment/Plan Principal Problem:   Acute on chronic  respiratory failure with hypoxia (HCC) Active Problems:   CAD (coronary artery disease)   Prediabetes   Acute on chronic systolic CHF (congestive heart failure) (HCC)   Hypertension   Hyperlipidemia   COPD exacerbation (HCC)   Hyperkalemia   Elevated troponin   Acute renal failure superimposed on stage 3a chronic kidney disease (HCC)   Lactic acidosis   Acute on chronic respiratory failure with hypoxia ([place]): Likely due to combination of COPD and CHF exacerbation.  Patient has diffused rhonchi and mild wheezing on auscultation, consistent with COPD exacerbation.  Patient has elevated BNP 4180 and pulmonary edema chest x-ray, consistent with CHF exacerbation. Pt has trace leg edema and positive JVD, clinically COPD exacerbation seem to be dominant etiology for her shortness of breath.  -Admitted to stepdown bed as inpatient -Continue BiPAP -Bronchodilators -per Dr. Lanney Gins, ICU team will also follow her  COPD exacerbation:  --Solu-Medrol 60 mg IV bid -Z pak (pt received 1 dose of cefepime in ED) -Blood culture -Mucinex for cough  -Incentive spirometry  Acute on chronic systolic CHF (congestive heart failure) ([place]): 2D echo 09/18/2019 showed EF 30-35%.  Since patient has worsening renal function, lactic acidosis with lactic acid 5.4, will not give IV lasix. -hold Lasix and spironolactone -continue ASA, Coreg, Entresto -f/u 2d Echo  Elevated troponin and hx of  CAD: trop 35. s/p of CABG. No Chest pain.  Likely due to demand ischemia -Trend troponin - on ASA, lipitor, plavix -Check A1c, FLP -Repeat EKG -Follow-up 2D echo  HTN:  -Continue home medications: Coreg, Entresto -hydralazine prn  Prediabetes: A1c 5.4 on 08/13/19. Her blood sugar is elevated 450, which is likely due to  steroid use -start SSI  Hyperlipidemia -lipitor  Hyperkalemia: K 5.2. -Lokelma 5 g  Acute renal failure superimposed on stage 3a chronic kidney disease ([place]):  Baseline Cre is 1.2-1.3, pt's Cre  is 1.73 and BUN 37 on admission. Likely due to cardiorenal syndrome and continuation of ARB, diuretics - IVF: pt received approximately 500 cc of normal saline in ED - Follow up renal function by BMP - Avoid using renal toxic medications, hypotension and contrast dye (or carefully use) - Hold Lasix and spironolactone  Lactic acidosis: lactic acid 5.4. No fever.  Clinically does not seem to have sepsis.  Possibly due to hypoxia and hypoperfusion due to CHF exacerbation -Trend lactic acid level -Patient received 500 cc of normal saline in ED   Inpatient status:  # Patient requires inpatient status due to high intensity of service, high risk for further deterioration and high frequency of surveillance required.  I certify that at the point of admission it is my clinical judgment that the patient will require inpatient hospital care spanning beyond 2 midnights from the point of admission.  . This patient has multiple chronic comorbidities including hypertension, hyperlipidemia, prediabetes COPD, GERD, gout, depression with anxiety, former smoker, AICD placement, CAD, CABG, colon cancer, sCHF with EF 30-35%, CKD stage III . Now patient has presenting with acute on chronic respiratory failure with hypoxia due to combination of COPD exacerbation and CHF exacerbation.  Patient also has worsening renal function, hyperkalemia, lactic acidosis. . The worrisome physical exam findings include positive JVD, diffuse rhonchi and mild wheezing on auscultation . The initial radiographic and laboratory data are worrisome because of elevated troponin, elevated BNP, hyperkalemia, worsening renal function, chest x-ray showed pulmonary edema. . Current medical needs: please see my assessment and plan . Predictability of an adverse outcome (risk): Patient has multiple comorbidities as listed above. Now presents with acute on chronic respiratory failure with hypoxia due to combination of COPD exacerbation and CHF  exacerbation.  Patient also has worsening renal function, hyperkalemia, lactic acidosis. Patient's presentation is highly complicated.  Patient is at high risk of deteriorating.  Will need to be treated in hospital for at least 2 days.      DVT ppx: SQ Heparin    Code Status: Full code Family Communication: None at bed side.      Disposition Plan:  Anticipate discharge back to previous home environment Consults called:  none Admission status:  SDU/inpation       Date of Service 12/18/2019    [place] [facility]   If 7PM-7AM, please contact night-coverage [email or web address] 12/18/2019, 10:04 AM

## 2019-12-18 NOTE — ED Triage Notes (Addendum)
Pt comes via [facility] from home with c/o of SOB. Pt states this started about 2 hours ago. Pt's inhaler was not working.   EMS arrived and states pt was not moving any air and was 88% on RA. Pt given 1 albuterol, 2 duonebs and 125 solu medrol.  Pt arrives with CPAP at 98%. Pt still working to breath. HR-110-120,BP-109/71, and CBG-300+  MD and RT at bedside. Pt placed on BIPAP.

## 2019-12-19 ENCOUNTER — Inpatient Hospital Stay: Payer: Medicare Other

## 2019-12-19 ENCOUNTER — Encounter: Payer: Self-pay | Admitting: Internal Medicine

## 2019-12-19 LAB — URINALYSIS, COMPLETE (UACMP) WITH MICROSCOPIC
Bacteria, UA: NONE SEEN
Bilirubin Urine: NEGATIVE
Glucose, UA: NEGATIVE mg/dL
Hgb urine dipstick: NEGATIVE
Ketones, ur: NEGATIVE mg/dL
Leukocytes,Ua: NEGATIVE
Nitrite: NEGATIVE
Protein, ur: NEGATIVE mg/dL
Specific Gravity, Urine: 1.016 (ref 1.005–1.030)
pH: 5 (ref 5.0–8.0)

## 2019-12-19 LAB — CBC WITH DIFFERENTIAL/PLATELET
Abs Immature Granulocytes: 0.1 10*3/uL — ABNORMAL HIGH (ref 0.00–0.07)
Basophils Absolute: 0 10*3/uL (ref 0.0–0.1)
Basophils Relative: 0 %
Eosinophils Absolute: 0 10*3/uL (ref 0.0–0.5)
Eosinophils Relative: 0 %
HCT: 37.5 % (ref 36.0–46.0)
Hemoglobin: 13.1 g/dL (ref 12.0–15.0)
Immature Granulocytes: 1 %
Lymphocytes Relative: 11 %
Lymphs Abs: 1 10*3/uL (ref 0.7–4.0)
MCH: 33.1 pg (ref 26.0–34.0)
MCHC: 34.9 g/dL (ref 30.0–36.0)
MCV: 94.7 fL (ref 80.0–100.0)
Monocytes Absolute: 0.3 10*3/uL (ref 0.1–1.0)
Monocytes Relative: 4 %
Neutro Abs: 7 10*3/uL (ref 1.7–7.7)
Neutrophils Relative %: 84 %
Platelets: 200 10*3/uL (ref 150–400)
RBC: 3.96 MIL/uL (ref 3.87–5.11)
RDW: 14.4 % (ref 11.5–15.5)
WBC: 8.4 10*3/uL (ref 4.0–10.5)
nRBC: 0.2 % (ref 0.0–0.2)

## 2019-12-19 LAB — BASIC METABOLIC PANEL
Anion gap: 11 (ref 5–15)
BUN: 42 mg/dL — ABNORMAL HIGH (ref 8–23)
CO2: 26 mmol/L (ref 22–32)
Calcium: 9 mg/dL (ref 8.9–10.3)
Chloride: 98 mmol/L (ref 98–111)
Creatinine, Ser: 1.44 mg/dL — ABNORMAL HIGH (ref 0.44–1.00)
GFR calc Af Amer: 44 mL/min — ABNORMAL LOW (ref >60)
GFR calc non Af Amer: 38 mL/min — ABNORMAL LOW (ref >60)
Glucose, Bld: 193 mg/dL — ABNORMAL HIGH (ref 70–99)
Potassium: 4.1 mmol/L (ref 3.5–5.1)
Sodium: 135 mmol/L (ref 135–145)

## 2019-12-19 LAB — MRSA PCR SCREENING: MRSA by PCR: NEGATIVE

## 2019-12-19 LAB — HEMOGLOBIN A1C
Hgb A1c MFr Bld: 6.7 % — ABNORMAL HIGH (ref 4.8–5.6)
Mean Plasma Glucose: 145.59 mg/dL

## 2019-12-19 LAB — LIPID PANEL
Cholesterol: 230 mg/dL — ABNORMAL HIGH (ref 0–200)
HDL: 55 mg/dL (ref >40)
LDL Cholesterol: 129 mg/dL — ABNORMAL HIGH (ref 0–99)
Total CHOL/HDL Ratio: 4.2 RATIO
Triglycerides: 230 mg/dL — ABNORMAL HIGH (ref <150)
VLDL: 46 mg/dL — ABNORMAL HIGH (ref 0–40)

## 2019-12-19 LAB — POTASSIUM: Potassium: 3.8 mmol/L (ref 3.5–5.1)

## 2019-12-19 LAB — GLUCOSE, CAPILLARY
Glucose-Capillary: 231 mg/dL — ABNORMAL HIGH (ref 70–99)
Glucose-Capillary: 277 mg/dL — ABNORMAL HIGH (ref 70–99)
Glucose-Capillary: 214 mg/dL — ABNORMAL HIGH (ref 70–99)
Glucose-Capillary: 221 mg/dL — ABNORMAL HIGH (ref 70–99)
Glucose-Capillary: 174 mg/dL — ABNORMAL HIGH (ref 70–99)

## 2019-12-19 LAB — PHOSPHORUS: Phosphorus: 5.4 mg/dL — ABNORMAL HIGH (ref 2.5–4.6)

## 2019-12-19 LAB — MAGNESIUM: Magnesium: 2.1 mg/dL (ref 1.7–2.4)

## 2019-12-19 MED ORDER — MORPHINE SULFATE (PF) 2 MG/ML IV SOLN
1.0000 mg | INTRAVENOUS | Status: DC | PRN
  Administered 2019-12-19 – 2019-12-21 (×3): 2 mg via INTRAVENOUS
  Filled 2019-12-19 (×3): qty 1

## 2019-12-19 MED ORDER — FAMOTIDINE 20 MG PO TABS: 20.0000 mg | ORAL_TABLET | Freq: Two times a day (BID) | ORAL | Status: DC | PRN

## 2019-12-19 MED ORDER — ORAL CARE MOUTH RINSE
15.0000 mL | Freq: Two times a day (BID) | OROMUCOSAL | Status: DC
  Administered 2019-12-19 – 2019-12-21 (×5): 15 mL via OROMUCOSAL

## 2019-12-19 MED ORDER — METHYLPREDNISOLONE SODIUM SUCC 40 MG IJ SOLR
40.0000 mg | Freq: Two times a day (BID) | INTRAMUSCULAR | Status: DC
  Administered 2019-12-19 – 2019-12-20 (×3): 40 mg via INTRAVENOUS
  Filled 2019-12-19 (×3): qty 1

## 2019-12-19 MED ORDER — ALBUTEROL SULFATE (2.5 MG/3ML) 0.083% IN NEBU: 2.5000 mg | INHALATION_SOLUTION | RESPIRATORY_TRACT | Status: DC | PRN

## 2019-12-19 NOTE — Progress Notes (Signed)
Pharmacy Electrolyte Monitoring Consult:  Pharmacy consulted to assist in monitoring and replacing electrolytes in this 68 y.o. female admitted on 12/18/2019. Patient with past medical history significant for hypertension, hyperlipidemia, COPD, AICD placement, CABG, Colon Cancer, CHF, and CKD.   Labs:  Sodium (mmol/L)  Date Value  12/19/2019 135  10/27/2014 141   Potassium (mmol/L)  Date Value  12/19/2019 4.1  10/27/2014 4.0   Magnesium (mg/dL)  Date Value  12/19/2019 2.1  02/25/2013 2.0   Phosphorus (mg/dL)  Date Value  12/19/2019 5.4 (H)  02/25/2013 3.8   Calcium (mg/dL)  Date Value  12/19/2019 9.0   Calcium, Total (mg/dL)  Date Value  10/27/2014 9.0   Albumin (g/dL)  Date Value  12/18/2019 3.6  08/18/2014 3.3 (L)    Assessment/Plan: Patient receiving furosemide 40mg  IV BID.   No replacement warranted. Will replace oral when possible in the setting of CHF/aggressive diuresis.   Will obtain electrolytes with am labs.   Will replace for goal potassium ~ 4 and goal magnesium ~ 2.   Simpson,Michael L 12/19/2019 3:41 PM

## 2019-12-19 NOTE — Progress Notes (Signed)
PHARMACIST - PHYSICIAN COMMUNICATION  CONCERNING: IV to Oral Route Change Policy  RECOMMENDATION: This patient is receiving famotidine by the intravenous route.  Based on criteria approved by the Pharmacy and Therapeutics Committee, the intravenous medication(s) is/are being converted to the equivalent oral dose form(s).  DESCRIPTION: These criteria include:  The patient is eating (either orally or via tube) and/or has been taking other orally administered medications for a least 24 hours  The patient has no evidence of active gastrointestinal bleeding or impaired GI absorption (gastrectomy, short bowel, patient on TNA or NPO).  If you have questions about this conversion, please contact the Pharmacy Department at [phone].   Stanely Sexson L, [facility] 12/19/2019 3:36 PM

## 2019-12-19 NOTE — Progress Notes (Signed)
PROGRESS NOTE    Ashley [name]  KNL:[id number] DOB: [birth year] DOA: 12/18/2019  PCP: [name], MD    LOS - 1   Brief Narrative:  Ashley [name] is a 69 y.o. female with medical history significant of hypertension, hyperlipidemia, prediabetes COPD, GERD, gout, depression with anxiety, former smoker, AICD placement, CAD, CABG, colon cancer, sCHF with EF 30-35%, CKD stage III, who presented to the ED on 2/11 with progressive shortness of breath.  Associated dry cough, nausea without vomiting.  EMS found patient with O2 sat in upper 80's on room air.  EMS placed on CPAP en route to the ED.  In the ED, tachycardic, hypertensive, tachypneic. Placed on Bipap.  Labs notable for WBC 15.6k, lactate 5.4 (repeat  2.2), troponin 35 (no chest pain), NT-pro-BNP 4180.  Negative for Covid-19, flu A and B.  Chest xray showed pulmonary edema.  Patient admitted to stepdown unit on hospitalist service.  Patient was diuresed with net fluid balance of -2.3 L.  Taken off Bipap by following morning and maintaining oxygen sat on 2 L/min oxygen by nasal cannula.  Downgraded to med/surg status.  Subjective 2/12: Patient seen this AM and reports feeling a LOT better today.  Still feels tight and wheezy.  Continues with dry cough.  No fever/chills or chest pain.  She says this is just like other times she's been admitted, always with heart and lung issues.  No acute events reported.    Assessment & Plan:   Principal Problem:   Acute on chronic respiratory failure with hypoxia (HCC) Active Problems:   CAD (coronary artery disease)   Prediabetes   Acute on chronic systolic CHF (congestive heart failure) (HCC)   Hypertension   Hyperlipidemia   COPD exacerbation (HCC)   Hyperkalemia   Elevated troponin   Acute renal failure superimposed on stage 3a chronic kidney disease (HCC)   Lactic acidosis   Acute on chronic respiratory failure with hypoxia ([place]): Likely due to combination of COPD and CHF  exacerbation.  Patient has diffused rhonchi and expiratory wheezing on exam, consistent with COPD exacerbation.  Patient has elevated BNP 4180 and pulmonary edema chest x-ray, consistent with CHF exacerbation.  --supplemental O2, maintain sat > 88% -Bronchodilators --PCCM consulted on admission and following, appreciate recs  COPD exacerbation:  --Solu-Medrol 40 mg IV bid --Duonebs q4h, Albuterol nebs q2h PRN --Z pak (pt received 1 dose of cefepime in ED) --f/u Blood cultures --Mucinex for cough  --Incentive spirometry --chest PT --morphine PRN air hunger or severe pain  Acute on chronic systolic CHF (congestive heart failure) ([place]): 2D echo 09/18/2019 showed EF 30-35%.  Echo this admission 2/11 EF lower at 20-25%.  Patient had appt at [facility] today (2/12) for planning mitral valve repair/replacment surgery.   Net -2.3 L fluid balance after diuresis yesterday.  Respiratory status much better today but continues with diffuse wheezing. --cont Lasix 40 mg IV BID --cont Entresto, Coreg, ASA --monitor volume status - daily weight, strict I/O's --low sodium & fluid restriction --tele monitoring   Elevated troponin and hx of  CAD: trop 35. s/p of CABG. No Chest pain.  Likely due to demand ischemia Echo as above.  Patient without any chest pain. -Trend flat - on ASA, lipitor, plavix  HTN:  -Continue home medications: Coreg, Entresto -hydralazine prn  Prediabetes with hypglycemia: A1c 5.4 on 08/13/19, but now up to 6.7% this admission.  Presented with glucose elevated at 450, likely due to steroids. -start SSI & titrate insulin up  as needed for goal 140-180 -patient should be started on diabetes agent discharge or very closely by PCP  Hyperlipidemia -lipitor  Hyperkalemia: K 5.2. -Lokelma 5 g  Acute renal failure superimposed on stage 3a chronic kidney disease ([place]):  Baseline Cre is 1.2-1.3, pt's Cr is 1.73 and BUN 37 on admission. Likely due to cardiorenal syndrome and  continuation of ARB, diuretics. AKI improving.   - IVF: Received 500 cc of normal saline in ED --Follow BMP --Avoid using renal toxic agents, hypotension   Lactic acidosis: lactic acid 5.4. No fever.  Clinically does not seem to have sepsis.  Most likely due to hypoxia and hypoperfusion due to CHF exacerbation & COPD -repeat with AM labs -Patient received 500 cc of normal saline in ED   DVT prophylaxis: heparin   Code Status: Full Code  Family Communication: none at bedside   Disposition Plan:  Expect d/c to prior home environment pending further clinical improvement.  Estimate 1-2 more days diuresis and IV steroids. Coming From Home Exp DC Date 2/14 Barriers: clinical improvement, PT eval Medically Stable for Discharge? No  Consultants:   Pulmonology  Procedures:   None  Antimicrobials:   PO Zithromax    Objective: Vitals:   12/19/19 1144 12/19/19 1200 12/19/19 1300 12/19/19 1400  BP:  112/71 98/67 102/69  Pulse: 80 77 79 79  Resp: (!) 23 (!) 28 16 16   Temp: 97.7 F (36.5 C)     TempSrc: Oral     SpO2: 100% 100% 100% 100%  Weight:      Height:        Intake/Output Summary (Last 24 hours) at 12/19/2019 1446 Last data filed at 12/19/2019 1403 Gross per 24 hour  Intake 703 ml  Output 3400 ml  Net -2697 ml   Filed Weights   12/18/19 0710 12/19/19 0500  Weight: 75.3 kg 75.8 kg    Examination:  General exam: awake, alert, no acute distress HEENT: atraumatic, clear conjunctiva, anicteric sclera, moist mucus membranes, hearing grossly normal  Respiratory system: diffuse wheezes and coarse rhonchi, normal respiratory effort, mild conversational dyspnea. Cardiovascular system: normal S1/S2, RRR, no pedal edema.   Central nervous system: A&O x4. no gross focal neurologic deficits, normal speech Extremities: moves all, no cyanosis, normal tone Skin: dry, intact, normal temperature Psychiatry: normal mood, congruent affect, judgement and insight appear  normal    Data Reviewed: I have personally reviewed following labs and imaging studies  CBC: Recent Labs  Lab 12/18/19 0716 12/19/19 0514  WBC 15.6* 8.4  NEUTROABS 8.8* 7.0  HGB 15.6* 13.1  HCT 46.8* 37.5  MCV 97.3 94.7  PLT 402* 324   Basic Metabolic Panel: Recent Labs  Lab 12/18/19 0716 12/18/19 2311 12/19/19 0514  NA 134*  --  135  K 5.2* 3.8 4.1  CL 96*  --  98  CO2 20*  --  26  GLUCOSE 450*  --  193*  BUN 37*  --  42*  CREATININE 1.73*  --  1.44*  CALCIUM 9.2  --  9.0  MG  --   --  2.1  PHOS  --   --  5.4*   GFR: Estimated Creatinine Clearance: 37.5 mL/min (A) (by C-G formula based on SCr of 1.44 mg/dL (H)). Liver Function Tests: Recent Labs  Lab 12/18/19 0716  AST 22  ALT 13  ALKPHOS 75  BILITOT 0.8  PROT 7.0  ALBUMIN 3.6   No results for input(s): LIPASE, AMYLASE in the last 168 hours. No  results for input(s): AMMONIA in the last 168 hours. Coagulation Profile: No results for input(s): INR, PROTIME in the last 168 hours. Cardiac Enzymes: No results for input(s): CKTOTAL, CKMB, CKMBINDEX, TROPONINI in the last 168 hours. BNP (last 3 results) No results for input(s): PROBNP in the last 8760 hours. HbA1C: Recent Labs    12/18/19 1054 12/19/19 0514  HGBA1C 6.7* 6.7*   CBG: Recent Labs  Lab 12/18/19 1030 12/18/19 1540 12/18/19 2147 12/19/19 0721 12/19/19 1137  GLUCAP 150* 216* 192* 231* 277*   Lipid Profile: Recent Labs    12/19/19 0514  CHOL 230*  HDL 55  LDLCALC 129*  TRIG 230*  CHOLHDL 4.2   Thyroid Function Tests: No results for input(s): TSH, T4TOTAL, FREET4, T3FREE, THYROIDAB in the last 72 hours. Anemia Panel: No results for input(s): VITAMINB12, FOLATE, FERRITIN, TIBC, IRON, RETICCTPCT in the last 72 hours. Sepsis Labs: Recent Labs  Lab 12/18/19 0716 12/18/19 0915  PROCALCITON  --  <0.10  LATICACIDVEN 5.4* 2.2*    Recent Results (from the past 240 hour(s))  Culture, blood (routine x 2)     Status: None  (Preliminary result)   Collection Time: 12/18/19  7:16 AM   Specimen: BLOOD  Result Value Ref Range Status   Specimen Description BLOOD LEFT HAND  Final   Special Requests   Final    BOTTLES DRAWN AEROBIC AND ANAEROBIC Blood Culture adequate volume   Culture   Final    NO GROWTH < 24 HOURS Performed at [facility], [address] [place], [place] [address]    Report Status PENDING  Incomplete  Culture, blood (routine x 2)     Status: None (Preliminary result)   Collection Time: 12/18/19  7:16 AM   Specimen: BLOOD  Result Value Ref Range Status   Specimen Description BLOOD BLOOD RIGHT WRIST  Final   Special Requests   Final    BOTTLES DRAWN AEROBIC AND ANAEROBIC Blood Culture adequate volume   Culture   Final    NO GROWTH < 24 HOURS Performed at [facility], [address] [place], [place] [address]    Report Status PENDING  Incomplete  Respiratory Panel by RT PCR (Flu A&B, Covid) - Nasopharyngeal Swab     Status: None   Collection Time: 12/18/19  7:17 AM   Specimen: Nasopharyngeal Swab  Result Value Ref Range Status   SARS Coronavirus 2 by RT PCR NEGATIVE NEGATIVE Final    Comment: (NOTE) SARS-CoV-2 target nucleic acids are NOT DETECTED. The SARS-CoV-2 RNA is generally detectable in upper respiratoy specimens during the acute phase of infection. The lowest concentration of SARS-CoV-2 viral copies this assay can detect is 131 copies/mL. A negative result does not preclude SARS-Cov-2 infection and should not be used as the sole basis for treatment or other patient management decisions. A negative result may occur with  improper specimen collection/handling, submission of specimen other than nasopharyngeal swab, presence of viral mutation(s) within the areas targeted by this assay, and inadequate number of viral copies (<131 copies/mL). A negative result must be combined with clinical observations, patient history, and epidemiological information.  The expected result is Negative. Fact Sheet for Patients:  PinkCheek.be Fact Sheet for Healthcare Providers:  GravelBags.it This test is not yet ap proved or cleared by the [place] FDA and  has been authorized for detection and/or diagnosis of SARS-CoV-2 by FDA under an Emergency Use Authorization (EUA). This EUA will remain  in effect (meaning this test can be used)  for the duration of the COVID-19 declaration under Section 564(b)(1) of the Act, 21 U.S.C. section 360bbb-3(b)(1), unless the authorization is terminated or revoked sooner.    Influenza A by PCR NEGATIVE NEGATIVE Final   Influenza B by PCR NEGATIVE NEGATIVE Final    Comment: (NOTE) The Xpert Xpress SARS-CoV-2/FLU/RSV assay is intended as an aid in  the diagnosis of influenza from Nasopharyngeal swab specimens and  should not be used as a sole basis for treatment. Nasal washings and  aspirates are unacceptable for Xpert Xpress SARS-CoV-2/FLU/RSV  testing. Fact Sheet for Patients: PinkCheek.be Fact Sheet for Healthcare Providers: GravelBags.it This test is not yet approved or cleared by the [place] FDA and  has been authorized for detection and/or diagnosis of SARS-CoV-2 by  FDA under an Emergency Use Authorization (EUA). This EUA will remain  in effect (meaning this test can be used) for the duration of the  Covid-19 declaration under Section 564(b)(1) of the Act, 21  U.S.C. section 360bbb-3(b)(1), unless the authorization is  terminated or revoked. Performed at [facility], [address] [place], [place] [address]   MRSA PCR Screening     Status: None   Collection Time: 12/19/19  7:49 AM   Specimen: Nasopharyngeal  Result Value Ref Range Status   MRSA by PCR NEGATIVE NEGATIVE Final    Comment:        The GeneXpert MRSA Assay (FDA approved for NASAL specimens only), is one  component of a comprehensive MRSA colonization surveillance program. It is not intended to diagnose MRSA infection nor to guide or monitor treatment for MRSA infections. Performed at [facility], [address], [place], [place] [address]          Radiology Studies: DG Chest [place] 1 View  Result Date: 12/19/2019 CLINICAL DATA:  Concern for pulmonary abscess EXAM: PORTABLE CHEST 1 VIEW COMPARISON:  Chest radiograph from one day prior. FINDINGS: Stable configuration of 3 lead right subclavian ICD and median sternotomy wires. Stable cardiomediastinal silhouette with moderate cardiomegaly. No pneumothorax. Trace right pleural effusion appears new. No left pleural effusion. No overt pulmonary edema, improved. Mild left basilar atelectasis. IMPRESSION: 1. Stable moderate cardiomegaly without overt pulmonary edema. 2. New trace right pleural effusion. 3. Mild left basilar atelectasis. Electronically Signed   By: Ilona Sorrel M.D.   On: 12/19/2019 11:43   DG Chest Portable 1 View  Result Date: 12/18/2019 CLINICAL DATA:  Respiratory distress EXAM: PORTABLE CHEST 1 VIEW COMPARISON:  11/18/2019 FINDINGS: Cardiomegaly with biventricular pacer and CABG. Diffuse interstitial opacity with Kerley lines. No visible effusion or air leak. There is artifact from hardware. IMPRESSION: CHF. Electronically Signed   By: Monte Fantasia M.D.   On: 12/18/2019 07:41   ECHOCARDIOGRAM COMPLETE  Result Date: 12/18/2019    ECHOCARDIOGRAM REPORT   Patient Name:   NAVADA OSTERHOUT [facility] Date of Exam: 12/18/2019 Medical Rec #:  [id number]         Height:       63.0 in Accession #:    [id number]        Weight:       166.0 lb Date of Birth:  1951-09-26         BSA:          1.79 m Patient Age:    89 years          BP:           136/108 mmHg Patient Gender: F  HR:           100 bpm. Exam Location:  [facility] Procedure: 2D Echo, Color Doppler and Cardiac Doppler Indications:     CHF- acute diastolic 829.93   History:         Patient has prior history of Echocardiogram examinations, most                  recent 11/18/2019. Pacemaker; Risk Factors:Hypertension.                  AICD,MI, mitral valve disorder.  Sonographer:     Sherrie Sport RDCS (AE) Referring Phys:  Baker Janus Soledad Gerlach NIU Diagnosing Phys: Ida Rogue MD  Sonographer Comments: Suboptimal parasternal window and no subcostal window. IMPRESSIONS  1. Left ventricular ejection fraction, by estimation, is 20 to 25%. Left ventricular ejection fraction by 2D MOD biplane is 25.3 %. The left ventricle has severely decreased function. The left ventrical demonstrates global hypokinesis. The left ventricular internal cavity size was moderately dilated. There is moderately increased left ventricular hypertrophy. Left ventricular diastolic parameters are indeterminate.  2. Right ventricular systolic function is normal. The right ventricular size is normal. There is mildly elevated pulmonary artery systolic pressure. The estimated right ventricular systolic pressure is 71.6 mmHg.  3. Left atrial size was moderately dilated.  4. The mitral valve has been repaired/replaced, very mobile anterior leaflet, posterior leaflet not well visualized. At least moderate mitral valve regurgitation. Visually there appears to be mild mitral stenosis, though mean graient estimates a higher degree of stenosis. FINDINGS  Left Ventricle: Left ventricular ejection fraction, by estimation, is 20 to 25%. Left ventricular ejection fraction by 2D MOD biplane is 25.3 % The left ventricle has severely decreased function. The left ventricle demonstrates global hypokinesis. The left ventricular internal cavity size was moderately dilated. There is moderately increased left ventricular hypertrophy. Left ventricular diastolic parameters are indeterminate. Right Ventricle: The right ventricular size is normal. No increase in right ventricular wall thickness. Right ventricular systolic function is normal. There  is mildly elevated pulmonary artery systolic pressure. The tricuspid regurgitant velocity is 2.34  m/s, and with an assumed right atrial pressure of 10 mmHg, the estimated right ventricular systolic pressure is 96.7 mmHg. Left Atrium: Left atrial size was moderately dilated. Right Atrium: Right atrial size was normal in size. Pericardium: There is no evidence of pericardial effusion. Mitral Valve: The mitral valve has been repaired/replaced. Normal mobility of the mitral valve leaflets. Moderate mitral valve regurgitation. Mild mitral valve stenosis. MV peak gradient, 26.0 mmHg. The mean mitral valve gradient is 16.0 mmHg. Tricuspid Valve: The tricuspid valve is normal in structure. Tricuspid valve regurgitation is mild . No evidence of tricuspid stenosis. Aortic Valve: The aortic valve was not well visualized. Aortic valve regurgitation is not visualized. Mild to moderate aortic valve sclerosis/calcification is present, without any evidence of aortic stenosis. Aortic valve mean gradient measures 4.3 mmHg.  Aortic valve peak gradient measures 7.1 mmHg. Aortic valve area, by VTI measures 2.09 cm. Pulmonic Valve: The pulmonic valve was normal in structure. Pulmonic valve regurgitation is not visualized. No evidence of pulmonic stenosis. Aorta: The aortic root is normal in size and structure. Venous: The inferior vena cava is normal in size with greater than 50% respiratory variability, suggesting right atrial pressure of 3 mmHg. The inferior vena cava and the hepatic vein show a normal flow pattern. IAS/Shunts: No atrial level shunt detected by color flow Doppler.  LEFT VENTRICLE PLAX 2D  Biplane EF (MOD) LVIDd:         4.59 cm         LV Biplane EF:   Left LVIDs:         3.41 cm                          ventricular LV PW:         1.71 cm                          ejection LV IVS:        1.25 cm                          fraction by LVOT diam:     2.20 cm                          2D MOD LV SV:          41.81 ml                         biplane is LV SV Index:   26.47                            25.3 % LVOT Area:     3.80 cm                                Diastology                                LV e' lateral:   7.40 cm/s LV Volumes (MOD)               LV E/e' lateral: 16.2 LV area d, A2C:  50.20 cm     LV e' medial:    5.98 cm/s LV area d, A4C:  39.30 cm     LV E/e' medial:  20.0 LV area s, A2C:  41.90 cm LV area s, A4C:  36.20 cm LV major d, A2C: 8.69 cm LV major d, A4C: 7.73 cm LV major s, A2C: 8.11 cm LV major s, A4C: 7.88 cm LV vol d, MOD    244.0 ml A2C: LV vol d, MOD    167.0 ml A4C: LV vol s, MOD    183.0 ml A2C: LV vol s, MOD    137.0 ml A4C: LV SV MOD A2C:   61.0 ml LV SV MOD A4C:   167.0 ml LV SV MOD BP:    54.0 ml RIGHT VENTRICLE RV Basal diam:  3.50 cm RV S prime:     6.64 cm/s TAPSE (M-mode): 3.2 cm LEFT ATRIUM             Index       RIGHT ATRIUM           Index LA diam:        2.60 cm 1.46 cm/m  RA Area:     22.90 cm LA Vol (A2C):   87.7 ml 49.09 ml/m RA Volume:   70.10 ml  39.24 ml/m LA Vol (A4C):   80.9 ml 45.29 ml/m LA Biplane Vol: 86.9 ml 48.64 ml/m  AORTIC  VALVE AV Area (Vmax):    1.82 cm AV Area (Vmean):   1.69 cm AV Area (VTI):     2.09 cm AV Vmax:           133.67 cm/s AV Vmean:          91.400 cm/s AV VTI:            0.200 m AV Peak Grad:      7.1 mmHg AV Mean Grad:      4.3 mmHg LVOT Vmax:         63.90 cm/s LVOT Vmean:        40.600 cm/s LVOT VTI:          0.110 m LVOT/AV VTI ratio: 0.55  AORTA Ao Root diam: 2.70 cm MITRAL VALVE                         TRICUSPID VALVE MV Area (PHT): 4.96 cm              TR Peak grad:   21.9 mmHg MV Peak grad:  26.0 mmHg             TR Vmax:        234.00 cm/s MV Mean grad:  16.0 mmHg MV Vmax:       2.55 m/s              SHUNTS MV Vmean:      183.0 cm/s            Systemic VTI:  0.11 m MV Decel Time: 153 msec              Systemic Diam: 2.20 cm MV E velocity: 119.80 cm/s 103 cm/s MV A velocity: 109.75 cm/s 70.3 cm/s MV E/A ratio:  1.09         1.5 Ida Rogue MD Electronically signed by Ida Rogue MD Signature Date/Time: 12/18/2019/6:06:18 PM    Final         Scheduled Meds: . ALPRAZolam  0.25-0.5 mg Oral QHS  . aspirin EC  81 mg Oral Daily  . atorvastatin  40 mg Oral Q0600  . azithromycin  250 mg Oral Daily  . carvedilol  6.25 mg Oral BID WC  . Chlorhexidine Gluconate Cloth  6 each Topical Daily  . clopidogrel  75 mg Oral Daily  . cyanocobalamin  1,000 mcg Intramuscular Q30 days  . dextromethorphan-guaiFENesin  1 tablet Oral BID  . escitalopram  10 mg Oral Daily  . febuxostat  40 mg Oral Daily  . ferrous sulfate  325 mg Oral Q breakfast  . fluticasone  1 puff Inhalation Daily   And  . umeclidinium-vilanterol  1 puff Inhalation Daily  . furosemide  40 mg Intravenous BID  . heparin  5,000 Units Subcutaneous Q8H  . insulin aspart  0-5 Units Subcutaneous QHS  . insulin aspart  0-9 Units Subcutaneous TID WC  . insulin aspart  3 Units Intravenous Once  . ipratropium-albuterol  3 mL Nebulization Q4H  . mouth rinse  15 mL Mouth Rinse BID  . methylPREDNISolone (SOLU-MEDROL) injection  40 mg Intravenous Q12H  . pantoprazole  40 mg Oral QAC breakfast  . sacubitril-valsartan  1 tablet Oral BID  . sodium chloride flush  3 mL Intravenous Q12H  . sodium zirconium cyclosilicate  5 g Oral Once  . traZODone  50 mg Oral Daily   Continuous Infusions: . sodium chloride    . famotidine (PEPCID) IV  LOS: 1 day    Time spent: 35-40 minutes    Ezekiel Slocumb, DO [facility]   If 7PM-7AM, please contact night-coverage [email or web address] 12/19/2019, 2:46 PM

## 2019-12-19 NOTE — Progress Notes (Signed)
CRITICAL CARE PROGRESS NOTE    Name: PIERINA SCHUKNECHT MRN: [id number] DOB: [birth year]     LOS: 1   SUBJECTIVE FINDINGS & SIGNIFICANT EVENTS   Patient description:  This is a pleasant 69 year old female with a history of advanced systolic CHF with EF of 30 to 35% status post AICD with severe CAD status post CABG, CKD stage III, history of colon cancer, COPD who was found at home with acute hypoxemia with oxygen saturation in the mid 80s.  She was provided with nebulized albuterol in the field on the way to the ER as well as BiPAP.  She was found to have elevated WBC count with lactic acidosis and a profoundly elevated BNP of over 4000 she was tested negative for COVID-19 also had abnormal electrolytes.  Her chest x-ray did show bilateral pulmonary edema.  Lines / Drains: PIVx2  Cultures / Sepsis markers: Respiratory and blood cultures  Antibiotics: Status post cefepime x1 now on Zithromax 500 daily   Protocols / Consultants: Hospitalist/PCCM  Tests / Events: Transthoracic echo     PAST MEDICAL HISTORY   Past Medical History:  Diagnosis Date  . AICD (automatic cardioverter/defibrillator) present    on right side  . Anemia   . CAD (coronary artery disease)    s/p CABG  . CHF (congestive heart failure) ([place])   . Chronic kidney disease    renal artery stenosis  . Colon cancer ([place])   . Depression   . GERD (gastroesophageal reflux disease)   . Hx of colonic polyps   . Hyperlipidemia   . Hypertension   . Mitral valve disorder    s/p mitral valve repair wth CABG  . Myocardial infarction ([place])   . Peripheral vascular disease ([place])   . Presence of permanent cardiac pacemaker    Pacemaker/ Defibrillator  . Tobacco abuse 10/11/2014     SURGICAL HISTORY   Past Surgical History:  Procedure  Laterality Date  . arm surgery     fracture, has plates and screws  . CABG with mitral valve repair    . CARDIAC CATHETERIZATION    . COLON SURGERY     colon cancer  . COLONOSCOPY WITH PROPOFOL N/A 10/09/2016   Procedure: COLONOSCOPY WITH PROPOFOL;  Surgeon: Jonathon Bellows, MD;  Location: [facility] ENDOSCOPY;  Service: Endoscopy;  Laterality: N/A;  . COLONOSCOPY WITH PROPOFOL N/A 07/24/2018   Procedure: COLONOSCOPY WITH PROPOFOL;  Surgeon: Virgel Manifold, MD;  Location: [facility] ENDOSCOPY;  Service: Endoscopy;  Laterality: N/A;  . CORONARY ARTERY BYPASS GRAFT    . ENTEROSCOPY N/A 11/21/2018   Procedure: ENTEROSCOPY-BALLOON;  Surgeon: Jonathon Bellows, MD;  Location[facility] ENDOSCOPY;  Service: Gastroenterology;  Laterality: N/A;  . ESOPHAGOGASTRODUODENOSCOPY (EGD) WITH PROPOFOL N/A 07/24/2018   Procedure: ESOPHAGOGASTRODUODENOSCOPY (EGD) WITH PROPOFOL;  Surgeon: Virgel Manifold, MD;  Location: [facility] ENDOSCOPY;  Service: Endoscopy;  Laterality: N/A;  . GIVENS CAPSULE STUDY N/A 08/16/2018   Procedure: GIVENS CAPSULE STUDY;  Surgeon: Virgel Manifold, MD;  Location: [facility] ENDOSCOPY;  Service: Endoscopy;  Laterality: N/A;  . pace maker defib  2016  . PERIPHERAL VASCULAR CATHETERIZATION N/A 10/23/2016   Procedure: Renal Angiography;  Surgeon: Algernon Huxley, MD;  Location: [place] CV LAB;  Service: Cardiovascular;  Laterality: N/A;  . RIGHT/LEFT HEART CATH AND CORONARY ANGIOGRAPHY N/A 08/26/2019   Procedure: RIGHT/LEFT HEART CATH AND CORONARY ANGIOGRAPHY;  Surgeon: Isaias Cowman, MD;  Location: [place] [facility] LAB;  Service: Cardiovascular;  Laterality: N/A;  . TOTAL ABDOMINAL HYSTERECTOMY  1999   history of abnormal pap     FAMILY HISTORY   Family History  Problem Relation Age of Onset  . Arthritis Mother   . Cancer Mother        uterus cancer  . Hyperlipidemia Mother   . Hypertension Mother   . Heart disease Mother   . Diabetes Mother   . Hyperlipidemia Father   . Hypertension  Father   . Heart disease Father   . Diabetes Father   . Cancer Sister        ovary cancer  . Diabetes Maternal Grandmother   . Hypertension Maternal Grandmother   . Arthritis Maternal Grandmother   . Hypertension Maternal Grandfather   . Hypertension Paternal Grandmother   . Hypertension Paternal Grandfather   . Heart disease Paternal Grandfather   . Heart disease Brother   . Kidney disease Brother   . Breast cancer Neg Hx      SOCIAL HISTORY   Social History   Tobacco Use  . Smoking status: Former Smoker    Packs/day: 0.25    Types: Cigarettes    Quit date: 06/19/2019    Years since quitting: 0.5  . Smokeless tobacco: Never Used  . Tobacco comment: reports smoking 2-3 cigarettes/ day  Substance Use Topics  . Alcohol use: Yes    Alcohol/week: 2.0 standard drinks    Types: 2 Shots of liquor per week    Comment: occasion  . Drug use: No     MEDICATIONS   Current Medication:  Current Facility-Administered Medications:  .  0.9 %  sodium chloride infusion, 250 mL, Intravenous, PRN, Ivor Costa, MD .  acetaminophen (TYLENOL) tablet 650 mg, 650 mg, Oral, Q4H PRN, Ivor Costa, MD .  albuterol (PROVENTIL) (2.5 MG/3ML) 0.083% nebulizer solution 2.5 mg, 2.5 mg, Nebulization, Q2H PRN, Ezekiel Slocumb, DO .  ALPRAZolam Duanne Moron) tablet 0.25-0.5 mg, 0.25-0.5 mg, Oral, QHS, Ivor Costa, MD, 0.5 mg at 12/18/19 2159 .  aspirin EC tablet 81 mg, 81 mg, Oral, Daily, Ivor Costa, MD, 81 mg at 12/19/19 1008 .  atorvastatin (LIPITOR) tablet 40 mg, 40 mg, Oral, Q0600, Ivor Costa, MD, 40 mg at 12/19/19 0617 .  [EXPIRED] azithromycin (ZITHROMAX) tablet 500 mg, 500 mg, Oral, Daily **FOLLOWED BY** azithromycin (ZITHROMAX) tablet 250 mg, 250 mg, Oral, Daily, Ivor Costa, MD, 250 mg at 12/19/19 1009 .  carvedilol (COREG) tablet 6.25 mg, 6.25 mg, Oral, BID WC, Ivor Costa, MD, 6.25 mg at 12/19/19 0746 .  Chlorhexidine Gluconate Cloth 2 % PADS 6 each, 6 each, Topical, Daily, Ivor Costa, MD, 6 each at  12/19/19 1000 .  clopidogrel (PLAVIX) tablet 75 mg, 75 mg, Oral, Daily, Ivor Costa, MD, 75 mg at 12/19/19 1008 .  cyanocobalamin ((VITAMIN B-12)) injection 1,000 mcg, 1,000 mcg, Intramuscular, Q30 days, Ivor Costa, MD, 1,000 mcg at 12/18/19 1707 .  dextromethorphan-guaiFENesin (MUCINEX DM) 30-600 MG per 12 hr tablet 1 tablet, 1 tablet, Oral, BID, Ivor Costa, MD, 1 tablet at 12/19/19 1008 .  diphenhydrAMINE (BENADRYL) capsule 25 mg, 25 mg, Oral, QHS PRN, Ivor Costa, MD .  escitalopram (LEXAPRO) tablet 10 mg, 10 mg, Oral, Daily, Ivor Costa, MD, 10 mg at 12/19/19 1008 .  famotidine (PEPCID) tablet 20 mg, 20 mg, Oral, Q12H PRN, Charlett Nose, RPH .  febuxostat (ULORIC) tablet 40 mg, 40 mg, Oral, Daily, Ivor Costa, MD, 40 mg at 12/19/19 1008 .  ferrous sulfate tablet 325 mg, 325 mg, Oral, Q breakfast, Ivor Costa, MD, 325 mg at  12/19/19 0746 .  fluticasone (FLOVENT HFA) 110 MCG/ACT inhaler 1 puff, 1 puff, Inhalation, Daily, 1 puff at 12/19/19 1007 **AND** umeclidinium-vilanterol (ANORO ELLIPTA) 62.5-25 MCG/INH 1 puff, 1 puff, Inhalation, Daily, Ivor Costa, MD, 1 puff at 12/19/19 0747 .  furosemide (LASIX) injection 40 mg, 40 mg, Intravenous, BID, Lanney Gins, Louvinia Cumbo, MD, 40 mg at 12/19/19 1007 .  heparin injection 5,000 Units, 5,000 Units, Subcutaneous, Q8H, Ivor Costa, MD, 5,000 Units at 12/19/19 1402 .  hydrALAZINE (APRESOLINE) injection 5 mg, 5 mg, Intravenous, Q2H PRN, Ivor Costa, MD .  insulin aspart (novoLOG) injection 0-5 Units, 0-5 Units, Subcutaneous, QHS, Niu, Xilin, MD .  insulin aspart (novoLOG) injection 0-9 Units, 0-9 Units, Subcutaneous, TID WC, Ivor Costa, MD, 5 Units at 12/19/19 1144 .  insulin aspart (novoLOG) injection 3 Units, 3 Units, Intravenous, Once, Ivor Costa, MD, Stopped at 12/18/19 1111 .  ipratropium-albuterol (DUONEB) 0.5-2.5 (3) MG/3ML nebulizer solution 3 mL, 3 mL, Nebulization, Q4H, Ivor Costa, MD, 3 mL at 12/19/19 1512 .  LORazepam (ATIVAN) injection 0.5 mg, 0.5 mg,  Intravenous, Q8H PRN, Ivor Costa, MD .  MEDLINE mouth rinse, 15 mL, Mouth Rinse, BID, Lanney Gins, Lavaya Defreitas, MD, 15 mL at 12/19/19 1016 .  methylPREDNISolone sodium succinate (SOLU-MEDROL) 40 mg/mL injection 40 mg, 40 mg, Intravenous, BID, Charlett Nose, RPH .  morphine 2 MG/ML injection 1-2 mg, 1-2 mg, Intravenous, Q4H PRN, Awilda Bill, NP, 2 mg at 12/19/19 0833 .  nitroGLYCERIN (NITROSTAT) SL tablet 0.4 mg, 0.4 mg, Sublingual, Q5 min PRN, Ivor Costa, MD .  ondansetron [facility]) injection 4 mg, 4 mg, Intravenous, Q6H PRN, Ivor Costa, MD .  pantoprazole (PROTONIX) EC tablet 40 mg, 40 mg, Oral, QAC breakfast, Ivor Costa, MD, 40 mg at 12/19/19 0746 .  sacubitril-valsartan (ENTRESTO) 24-26 mg per tablet, 1 tablet, Oral, BID, Ivor Costa, MD, 1 tablet at 12/19/19 1008 .  sodium chloride flush (NS) 0.9 % injection 3 mL, 3 mL, Intravenous, Q12H, Ivor Costa, MD, 3 mL at 12/19/19 1011 .  sodium chloride flush (NS) 0.9 % injection 3 mL, 3 mL, Intravenous, PRN, Ivor Costa, MD .  traZODone (DESYREL) tablet 50 mg, 50 mg, Oral, Daily, Ivor Costa, MD, 50 mg at 12/18/19 2159    ALLERGIES   Vancomycin and Hydrocodone-acetaminophen    REVIEW OF SYSTEMS     10 point ROS conducted and is negative except as per subjective findings  PHYSICAL EXAMINATION   Vital Signs: Temp:  [97.5 F (36.4 C)-98.1 F (36.7 C)] 97.7 F (36.5 C) (02/12 1144) Pulse Rate:  [70-121] 96 (02/12 1500) Resp:  [15-28] 23 (02/12 1500) BP: (88-151)/(60-96) 102/70 (02/12 1500) SpO2:  [86 %-100 %] 97 % (02/12 1500) FiO2 (%):  [21 %-28 %] 24 % (02/12 0345) Weight:  [75.8 kg] 75.8 kg (02/12 0500)  GENERAL: Chronically ill-appearing HEAD: Normocephalic, atraumatic.  EYES: Pupils equal, round, reactive to light.  No scleral icterus.  MOUTH: Moist mucosal membrane. NECK: Supple. No thyromegaly. No nodules. No JVD.  PULMONARY: Crackles at the bases bilaterally CARDIOVASCULAR: S1 and S2. Regular rate and rhythm. No murmurs,  rubs, or gallops.  GASTROINTESTINAL: Soft, nontender, non-distended. No masses. Positive bowel sounds. No hepatosplenomegaly.  MUSCULOSKELETAL: No swelling, clubbing, or edema.  NEUROLOGIC: Mild distress due to acute illness SKIN:intact,warm,dry   PERTINENT DATA     Infusions: . sodium chloride     Scheduled Medications: . ALPRAZolam  0.25-0.5 mg Oral QHS  . aspirin EC  81 mg Oral Daily  . atorvastatin  40 mg Oral Q0600  .  azithromycin  250 mg Oral Daily  . carvedilol  6.25 mg Oral BID WC  . Chlorhexidine Gluconate Cloth  6 each Topical Daily  . clopidogrel  75 mg Oral Daily  . cyanocobalamin  1,000 mcg Intramuscular Q30 days  . dextromethorphan-guaiFENesin  1 tablet Oral BID  . escitalopram  10 mg Oral Daily  . febuxostat  40 mg Oral Daily  . ferrous sulfate  325 mg Oral Q breakfast  . fluticasone  1 puff Inhalation Daily   And  . umeclidinium-vilanterol  1 puff Inhalation Daily  . furosemide  40 mg Intravenous BID  . heparin  5,000 Units Subcutaneous Q8H  . insulin aspart  0-5 Units Subcutaneous QHS  . insulin aspart  0-9 Units Subcutaneous TID WC  . insulin aspart  3 Units Intravenous Once  . ipratropium-albuterol  3 mL Nebulization Q4H  . mouth rinse  15 mL Mouth Rinse BID  . methylPREDNISolone (SOLU-MEDROL) injection  40 mg Intravenous BID  . pantoprazole  40 mg Oral QAC breakfast  . sacubitril-valsartan  1 tablet Oral BID  . sodium chloride flush  3 mL Intravenous Q12H  . traZODone  50 mg Oral Daily   PRN Medications: sodium chloride, acetaminophen, albuterol, diphenhydrAMINE, famotidine, hydrALAZINE, LORazepam, morphine injection, nitroGLYCERIN, ondansetron (ZOFRAN) IV, sodium chloride flush Hemodynamic parameters:   Intake/Output: 02/11 0701 - 02/12 0700 In: 884 [P.O.:100; I.V.:3; IV Piggyback:500] Out: 2950 [Urine:2950]  Ventilator  Settings: FiO2 (%):  [21 %-28 %] 24 %     LAB RESULTS:  Basic Metabolic Panel: Recent Labs  Lab 12/18/19 0716  12/18/19 0716 12/18/19 2311 12/19/19 0514  NA 134*  --   --  135  K 5.2*   < > 3.8 4.1  CL 96*  --   --  98  CO2 20*  --   --  26  GLUCOSE 450*  --   --  193*  BUN 37*  --   --  42*  CREATININE 1.73*  --   --  1.44*  CALCIUM 9.2  --   --  9.0  MG  --   --   --  2.1  PHOS  --   --   --  5.4*   < > = values in this interval not displayed.   Liver Function Tests: Recent Labs  Lab 12/18/19 0716  AST 22  ALT 13  ALKPHOS 75  BILITOT 0.8  PROT 7.0  ALBUMIN 3.6   No results for input(s): LIPASE, AMYLASE in the last 168 hours. No results for input(s): AMMONIA in the last 168 hours. CBC: Recent Labs  Lab 12/18/19 0716 12/19/19 0514  WBC 15.6* 8.4  NEUTROABS 8.8* 7.0  HGB 15.6* 13.1  HCT 46.8* 37.5  MCV 97.3 94.7  PLT 402* 200   Cardiac Enzymes: No results for input(s): CKTOTAL, CKMB, CKMBINDEX, TROPONINI in the last 168 hours. BNP: Invalid input(s): POCBNP CBG: Recent Labs  Lab 12/18/19 1540 12/18/19 2147 12/19/19 0721 12/19/19 1137 12/19/19 1558  GLUCAP 216* 192* 231* 277* 214*     IMAGING RESULTS:  Imaging: DG Chest Port 1 View  Result Date: 12/19/2019 CLINICAL DATA:  Concern for pulmonary abscess EXAM: PORTABLE CHEST 1 VIEW COMPARISON:  Chest radiograph from one day prior. FINDINGS: Stable configuration of 3 lead right subclavian ICD and median sternotomy wires. Stable cardiomediastinal silhouette with moderate cardiomegaly. No pneumothorax. Trace right pleural effusion appears new. No left pleural effusion. No overt pulmonary edema, improved. Mild left basilar atelectasis. IMPRESSION: 1. Stable moderate  cardiomegaly without overt pulmonary edema. 2. New trace right pleural effusion. 3. Mild left basilar atelectasis. Electronically Signed   By: Ilona Sorrel M.D.   On: 12/19/2019 11:43   DG Chest Portable 1 View  Result Date: 12/18/2019 CLINICAL DATA:  Respiratory distress EXAM: PORTABLE CHEST 1 VIEW COMPARISON:  11/18/2019 FINDINGS: Cardiomegaly with  biventricular pacer and CABG. Diffuse interstitial opacity with Kerley lines. No visible effusion or air leak. There is artifact from hardware. IMPRESSION: CHF. Electronically Signed   By: Monte Fantasia M.D.   On: 12/18/2019 07:41   ECHOCARDIOGRAM COMPLETE  Result Date: 12/18/2019    ECHOCARDIOGRAM REPORT   Patient Name:   BAYLEA MILBURN [facility] Date of Exam: 12/18/2019 Medical Rec #:  [id number]         Height:       63.0 in Accession #:    [id number]        Weight:       166.0 lb Date of Birth:  [birth year]         BSA:          1.79 m Patient Age:    6 years          BP:           136/108 mmHg Patient Gender: F                 HR:           100 bpm. Exam Location:  [facility] Procedure: 2D Echo, Color Doppler and Cardiac Doppler Indications:     CHF- acute diastolic 497.02  History:         Patient has prior history of Echocardiogram examinations, most                  recent 11/18/2019. Pacemaker; Risk Factors:Hypertension.                  AICD,MI, mitral valve disorder.  Sonographer:     Sherrie Sport RDCS (AE) Referring Phys:  Baker Janus Soledad Gerlach NIU Diagnosing Phys: Ida Rogue MD  Sonographer Comments: Suboptimal parasternal window and no subcostal window. IMPRESSIONS  1. Left ventricular ejection fraction, by estimation, is 20 to 25%. Left ventricular ejection fraction by 2D MOD biplane is 25.3 %. The left ventricle has severely decreased function. The left ventrical demonstrates global hypokinesis. The left ventricular internal cavity size was moderately dilated. There is moderately increased left ventricular hypertrophy. Left ventricular diastolic parameters are indeterminate.  2. Right ventricular systolic function is normal. The right ventricular size is normal. There is mildly elevated pulmonary artery systolic pressure. The estimated right ventricular systolic pressure is 63.7 mmHg.  3. Left atrial size was moderately dilated.  4. The mitral valve has been repaired/replaced, very mobile anterior leaflet, posterior  leaflet not well visualized. At least moderate mitral valve regurgitation. Visually there appears to be mild mitral stenosis, though mean graient estimates a higher degree of stenosis. FINDINGS  Left Ventricle: Left ventricular ejection fraction, by estimation, is 20 to 25%. Left ventricular ejection fraction by 2D MOD biplane is 25.3 % The left ventricle has severely decreased function. The left ventricle demonstrates global hypokinesis. The left ventricular internal cavity size was moderately dilated. There is moderately increased left ventricular hypertrophy. Left ventricular diastolic parameters are indeterminate. Right Ventricle: The right ventricular size is normal. No increase in right ventricular wall thickness. Right ventricular systolic function is normal. There is mildly elevated pulmonary artery systolic pressure. The tricuspid regurgitant velocity is 2.34  m/s,  and with an assumed right atrial pressure of 10 mmHg, the estimated right ventricular systolic pressure is 16.1 mmHg. Left Atrium: Left atrial size was moderately dilated. Right Atrium: Right atrial size was normal in size. Pericardium: There is no evidence of pericardial effusion. Mitral Valve: The mitral valve has been repaired/replaced. Normal mobility of the mitral valve leaflets. Moderate mitral valve regurgitation. Mild mitral valve stenosis. MV peak gradient, 26.0 mmHg. The mean mitral valve gradient is 16.0 mmHg. Tricuspid Valve: The tricuspid valve is normal in structure. Tricuspid valve regurgitation is mild . No evidence of tricuspid stenosis. Aortic Valve: The aortic valve was not well visualized. Aortic valve regurgitation is not visualized. Mild to moderate aortic valve sclerosis/calcification is present, without any evidence of aortic stenosis. Aortic valve mean gradient measures 4.3 mmHg.  Aortic valve peak gradient measures 7.1 mmHg. Aortic valve area, by VTI measures 2.09 cm. Pulmonic Valve: The pulmonic valve was normal in  structure. Pulmonic valve regurgitation is not visualized. No evidence of pulmonic stenosis. Aorta: The aortic root is normal in size and structure. Venous: The inferior vena cava is normal in size with greater than 50% respiratory variability, suggesting right atrial pressure of 3 mmHg. The inferior vena cava and the hepatic vein show a normal flow pattern. IAS/Shunts: No atrial level shunt detected by color flow Doppler.  LEFT VENTRICLE PLAX 2D                        Biplane EF (MOD) LVIDd:         4.59 cm         LV Biplane EF:   Left LVIDs:         3.41 cm                          ventricular LV PW:         1.71 cm                          ejection LV IVS:        1.25 cm                          fraction by LVOT diam:     2.20 cm                          2D MOD LV SV:         41.81 ml                         biplane is LV SV Index:   26.47                            25.3 % LVOT Area:     3.80 cm                                Diastology                                LV e' lateral:   7.40 cm/s LV Volumes (MOD)  LV E/e' lateral: 16.2 LV area d, A2C:  50.20 cm     LV e' medial:    5.98 cm/s LV area d, A4C:  39.30 cm     LV E/e' medial:  20.0 LV area s, A2C:  41.90 cm LV area s, A4C:  36.20 cm LV major d, A2C: 8.69 cm LV major d, A4C: 7.73 cm LV major s, A2C: 8.11 cm LV major s, A4C: 7.88 cm LV vol d, MOD    244.0 ml A2C: LV vol d, MOD    167.0 ml A4C: LV vol s, MOD    183.0 ml A2C: LV vol s, MOD    137.0 ml A4C: LV SV MOD A2C:   61.0 ml LV SV MOD A4C:   167.0 ml LV SV MOD BP:    54.0 ml RIGHT VENTRICLE RV Basal diam:  3.50 cm RV S prime:     6.64 cm/s TAPSE (M-mode): 3.2 cm LEFT ATRIUM             Index       RIGHT ATRIUM           Index LA diam:        2.60 cm 1.46 cm/m  RA Area:     22.90 cm LA Vol (A2C):   87.7 ml 49.09 ml/m RA Volume:   70.10 ml  39.24 ml/m LA Vol (A4C):   80.9 ml 45.29 ml/m LA Biplane Vol: 86.9 ml 48.64 ml/m  AORTIC VALVE AV Area (Vmax):    1.82 cm AV Area (Vmean):    1.69 cm AV Area (VTI):     2.09 cm AV Vmax:           133.67 cm/s AV Vmean:          91.400 cm/s AV VTI:            0.200 m AV Peak Grad:      7.1 mmHg AV Mean Grad:      4.3 mmHg LVOT Vmax:         63.90 cm/s LVOT Vmean:        40.600 cm/s LVOT VTI:          0.110 m LVOT/AV VTI ratio: 0.55  AORTA Ao Root diam: 2.70 cm MITRAL VALVE                         TRICUSPID VALVE MV Area (PHT): 4.96 cm              TR Peak grad:   21.9 mmHg MV Peak grad:  26.0 mmHg             TR Vmax:        234.00 cm/s MV Mean grad:  16.0 mmHg MV Vmax:       2.55 m/s              SHUNTS MV Vmean:      183.0 cm/s            Systemic VTI:  0.11 m MV Decel Time: 153 msec              Systemic Diam: 2.20 cm MV E velocity: 119.80 cm/s 103 cm/s MV A velocity: 109.75 cm/s 70.3 cm/s MV E/A ratio:  1.09        1.5 Ida Rogue MD Electronically signed by Ida Rogue MD Signature Date/Time: 12/18/2019/6:06:18 PM    Final       ASSESSMENT  AND PLAN    -Multidisciplinary rounds held today   Acute Hypoxic Respiratory Failure -Due to acute decompensated systolic CHF with concurrent chronic COPD -continue Bronchodilator Therapy -Wean Fio2 and PEEP as tolerated -will perform SAT/SBT when respiratory parameters are met -will decrease steroids to 40 twice daily today -chest physiotherapy - IS and BedPT    Acute decompensated systolic CHF -oxygen as needed -Lasix as tolerated -follow up cardiac enzymes as indicated ICU monitoring   Renal Failure-most likely due to ATN -follow chem 7 -follow UO -continue Foley Catheter-assess need daily  ID -continue IV abx as prescibed -follow up cultures  GI/Nutrition GI PROPHYLAXIS as indicated DIET-->TF's as tolerated Constipation protocol as indicated  ENDO - ICU hypoglycemic\Hyperglycemia protocol -check FSBS per protocol   ELECTROLYTES -follow labs as needed -replace as needed -pharmacy consultation   DVT/GI PRX ordered -SCDs  TRANSFUSIONS AS NEEDED MONITOR  FSBS ASSESS the need for LABS as needed   Critical care provider statement:    Critical care time (minutes):  33   Critical care time was exclusive of:  Separately billable procedures and treating other patients   Critical care was necessary to treat or prevent imminent or life-threatening deterioration of the following conditions:  Acute systolic CHF exacerbation, acute kidney injury, multiple comorobid conditions   Critical care was time spent personally by me on the following activities:  Development of treatment plan with patient or surrogate, discussions with consultants, evaluation of patient's response to treatment, examination of patient, obtaining history from patient or surrogate, ordering and performing treatments and interventions, ordering and review of laboratory studies and re-evaluation of patient's condition.  I assumed direction of critical care for this patient from another provider in my specialty: no    This document was prepared using Dragon voice recognition software and may include unintentional dictation errors.    Ottie Glazier, M.D.  Division of [place]

## 2019-12-19 NOTE — TOC Initial Note (Signed)
Transition of Care [facility]) - Initial/Assessment Note    Patient Details  Name: Ashley [name] MRN: [id number] Date of Birth: [birth year]  Transition of Care [facility]) CM/SW Contact:    Shelbie Ammons, RN Phone Number: 12/19/2019, 11:52 AM  Clinical Narrative:   RNCM assessed patient at bedside, sitting up in bed with oxygen via [place]. Patient alert and pleasant and reports that she is feeling much better than she did when she came in. Patient reports that she was supposed to be at [facility] today to meet with surgeon that will likely due her heart valve surgery and have some testing done. Patient reports that she lives in single family home with her fiance and his brother. She reports that she no longer drives due to her health issues but that her fiance or his mother provides her transportation. Discussed HF management and patient reports that she gets up and weighs every day but that she never really has any fluctuations in her weight and that she never has any swelling. She reports that she does follow up with Darylene Price at the [facility] as well as her PCP and she has no trouble obtaining her medications. Patient reports that she is independent at home and does not for see any needs after going home.        RNCM will continue to follow for any needs.    Expected Discharge Plan: Home/Self Care Barriers to Discharge: Continued Medical Work up   Patient Goals and CMS Choice Patient states their goals for this hospitalization and ongoing recovery are:: to stay well long enough to be able to get the surgery she needs to fix her heart valve      Expected Discharge Plan and Services Expected Discharge Plan: Home/Self Care   Discharge Planning Services: CM Consult   Living arrangements for the past 2 months: Single Family Home                                      Prior Living Arrangements/Services Living arrangements for the past 2 months: Single Family Home Lives with:: Significant Other,  Roommate Patient language and need for interpreter reviewed:: Yes        Need for Family Participation in Patient Care: Yes (Comment) Care giver support system in place?: Yes (comment)   Criminal Activity/Legal Involvement Pertinent to Current Situation/Hospitalization: No - Comment as needed  Activities of Daily Living Home Assistive Devices/Equipment: Cane (specify quad or straight) ADL Screening (condition at time of admission) Patient's cognitive ability adequate to safely complete daily activities?: Yes Is the patient deaf or have difficulty hearing?: No Does the patient have difficulty seeing, even when wearing glasses/contacts?: Yes Does the patient have difficulty concentrating, remembering, or making decisions?: No Patient able to express need for assistance with ADLs?: No Does the patient have difficulty dressing or bathing?: No Independently performs ADLs?: Yes (appropriate for developmental age) Does the patient have difficulty walking or climbing stairs?: No Weakness of Legs: None Weakness of Arms/Hands: None  Permission Sought/Granted                  Emotional Assessment Appearance:: Appears stated age Attitude/Demeanor/Rapport: Engaged Affect (typically observed): Appropriate Orientation: : Oriented to Self, Oriented to Place, Oriented to  Time, Oriented to Situation Alcohol / Substance Use: Never Used Psych Involvement: No (comment)  Admission diagnosis:  Acute on chronic systolic CHF (congestive heart failure) ([place]) [I50.23]  Acute on chronic respiratory failure with hypoxia (H[facility]) [J96.21] Patient Active Problem List   Diagnosis Date Noted  . Hyperlipidemia   . COPD exacerbation ([place])   . Hyperkalemia   . Elevated troponin   . Acute renal failure superimposed on stage 3a chronic kidney disease ([place])   . Lactic acidosis   . Gouty arthritis of both feet 11/20/2019  . Mitral stenosis with insufficiency   . Hypertensive urgency   . Chronic renal  failure, stage 3a   . Depression   . Acute on chronic respiratory failure with hypoxia ([place]) 09/22/2019  . Hyperglycemia 09/22/2019  . Presence of permanent cardiac pacemaker   . Anemia   . Chronic kidney disease   . Hypertension   . Mitral valve disorder 08/25/2019  . Acute respiratory failure ([place]) 08/24/2019  . CHF (congestive heart failure) ([place]) 07/16/2019  . Cough with hemoptysis 06/09/2019  . Insomnia 05/07/2019  . Hypokalemia 03/23/2019  . Hyperglycemia, drug-induced 03/23/2019  . COVID-19 virus not detected 03/23/2019  . Respiratory failure ([place]) 03/16/2019  . AVM (arteriovenous malformation) of small bowel, acquired   . Anemia, iron deficiency 11/10/2018  . Acute on chronic systolic CHF (congestive heart failure) ([place]) 11/06/2018  . Rectal polyp   . Benign neoplasm of cecum   . Barrett's esophagus without dysplasia   . Stomach irritation   . Chronic diastolic heart failure ([place]) 07/03/2018  . HTN (hypertension) 07/03/2018  . COPD with emphysema ([place]) 06/28/2018  . B12 deficiency anemia 06/28/2018  . Hypotension 05/11/2018  . Prediabetes 05/11/2018  . Personal history of colon cancer   . Benign neoplasm of descending colon   . Polyp of sigmoid colon   . Benign neoplasm of transverse colon   . Diverticulosis of large intestine without diverticulitis   . 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 Renal artery stenosis ([place]) 10/03/2016  . 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 Hospital discharge follow-up 02/09/2015  . Cough in adult 10/21/2014  . Tobacco abuse counseling 10/11/2014  . Tobacco abuse 10/11/2014  . Chronic right hip pain 08/26/2014  . COPD with acute exacerbation ([place]) 06/18/2013  . Atherosclerosis of native artery of extremity with intermittent claudication ([place]) 06/18/2013  . Preoperative evaluation to rule out surgical contraindication 06/18/2013  . CAD (coronary artery disease) 06/01/2013  . GERD  (gastroesophageal reflux disease) 06/01/2013  . Hypercholesterolemia 06/01/2013  . Tubular adenoma of colon 06/01/2013  . Major depressive disorder in remission ([place]) 06/01/2013   PCP:  [name], MD Pharmacy:   [place], [place], [place] [address] [place] [address] Phone: [phone] Fax: [place], [place] [address] [place] [address] Phone: [phone] Fax: [phone]     Social Determinants of Health ([place]) Interventions    Readmission Risk Interventions Readmission Risk Prevention Plan 12/19/2019 11/18/2019 10/16/2019  Transportation Screening Complete Complete Complete  PCP or Specialist Appt within 3-5 Days - - -  [place] or [place] - - -  Palliative Care Screening - - -  Medication Review (RN Care Manager) Complete Complete Complete  PCP or Specialist appointment within 3-5 days of discharge Complete Complete -  [place] or Home Care Consult Patient refused - Patient refused  SW Recovery Care/Counseling Consult - - Complete  Palliative Care Screening Not Applicable Not Applicable Not [place] Not Applicable Not  Applicable Not Applicable  Some recent data might be hidden

## 2019-12-20 LAB — MAGNESIUM: Magnesium: 2.1 mg/dL (ref 1.7–2.4)

## 2019-12-20 LAB — BASIC METABOLIC PANEL
Anion gap: 9 (ref 5–15)
BUN: 63 mg/dL — ABNORMAL HIGH (ref 8–23)
CO2: 24 mmol/L (ref 22–32)
Calcium: 8.9 mg/dL (ref 8.9–10.3)
Chloride: 97 mmol/L — ABNORMAL LOW (ref 98–111)
Creatinine, Ser: 1.43 mg/dL — ABNORMAL HIGH (ref 0.44–1.00)
GFR calc Af Amer: 44 mL/min — ABNORMAL LOW (ref >60)
GFR calc non Af Amer: 38 mL/min — ABNORMAL LOW (ref >60)
Glucose, Bld: 267 mg/dL — ABNORMAL HIGH (ref 70–99)
Potassium: 4.2 mmol/L (ref 3.5–5.1)
Sodium: 130 mmol/L — ABNORMAL LOW (ref 135–145)

## 2019-12-20 LAB — GLUCOSE, CAPILLARY
Glucose-Capillary: 175 mg/dL — ABNORMAL HIGH (ref 70–99)
Glucose-Capillary: 223 mg/dL — ABNORMAL HIGH (ref 70–99)
Glucose-Capillary: 170 mg/dL — ABNORMAL HIGH (ref 70–99)
Glucose-Capillary: 225 mg/dL — ABNORMAL HIGH (ref 70–99)

## 2019-12-20 LAB — LACTIC ACID, PLASMA: Lactic Acid, Venous: 2.3 mmol/L (ref 0.5–1.9)

## 2019-12-20 MED ORDER — FUROSEMIDE 40 MG PO TABS
40.0000 mg | ORAL_TABLET | Freq: Two times a day (BID) | ORAL | Status: DC
  Administered 2019-12-20 – 2019-12-21 (×3): 40 mg via ORAL
  Filled 2019-12-20 (×3): qty 1

## 2019-12-20 MED ORDER — IPRATROPIUM-ALBUTEROL 0.5-2.5 (3) MG/3ML IN SOLN
3.0000 mL | Freq: Three times a day (TID) | RESPIRATORY_TRACT | Status: DC
  Administered 2019-12-20 – 2019-12-21 (×3): 3 mL via RESPIRATORY_TRACT
  Filled 2019-12-20 (×3): qty 3

## 2019-12-20 MED ORDER — POLYETHYLENE GLYCOL 3350 17 G PO PACK
17.0000 g | PACK | Freq: Every day | ORAL | Status: DC
  Administered 2019-12-20 – 2019-12-21 (×2): 17 g via ORAL
  Filled 2019-12-20 (×2): qty 1

## 2019-12-20 MED ORDER — BISACODYL 5 MG PO TBEC
5.0000 mg | DELAYED_RELEASE_TABLET | Freq: Every day | ORAL | Status: DC | PRN
  Administered 2019-12-20: 12:00:00 5 mg via ORAL
  Filled 2019-12-20: qty 1

## 2019-12-20 MED ORDER — MAGNESIUM CITRATE PO SOLN
1.0000 | Freq: Once | ORAL | Status: AC
  Administered 2019-12-20: 13:00:00 1 via ORAL
  Filled 2019-12-20: qty 296

## 2019-12-20 NOTE — Progress Notes (Signed)
Pharmacy Electrolyte Monitoring Consult:  Pharmacy consulted to assist in monitoring and replacing electrolytes in this 69 y.o. female admitted on 12/18/2019. Patient with past medical history significant for hypertension, hyperlipidemia, COPD, AICD placement, CABG, Colon Cancer, CHF, and CKD.   Labs:  Sodium (mmol/L)  Date Value  12/20/2019 130 (L)  10/27/2014 141   Potassium (mmol/L)  Date Value  12/20/2019 4.2  10/27/2014 4.0   Magnesium (mg/dL)  Date Value  12/20/2019 2.1  02/25/2013 2.0   Phosphorus (mg/dL)  Date Value  12/19/2019 5.4 (H)  02/25/2013 3.8   Calcium (mg/dL)  Date Value  12/20/2019 8.9   Calcium, Total (mg/dL)  Date Value  10/27/2014 9.0   Albumin (g/dL)  Date Value  12/18/2019 3.6  08/18/2014 3.3 (L)    Assessment/Plan: Patient receiving furosemide 40mg  IV BID, and on entresto 24=26 mg BID.   No replacement warranted. Will replace oral when possible in the setting of CHF/aggressive diuresis.   Will obtain electrolytes with am labs.   Will replace for goal potassium ~ 4 and goal magnesium ~ 2.   Oswald Hillock 12/20/2019 7:42 AM

## 2019-12-20 NOTE — Progress Notes (Addendum)
PROGRESS NOTE    Ashley [name]  GMW:[id number] DOB: 03-11-1951 DOA: 12/18/2019  PCP: Ashley Mc, MD    LOS - 2   Brief Narrative:  Ashley [name] a 69 y.o.femalewith medical history significant ofhypertension, hyperlipidemia, prediabetes COPD, GERD, gout, depression with anxiety, former smoker, AICD placement, CAD, CABG, colon cancer,sCHF with EF 30-35%, CKD stage III, who presented to the ED on 2/11 with progressive shortness of breath.  Associated dry cough, nausea without vomiting.  EMS found patient with O2 sat in upper 80's on room air.  EMS placed on CPAP en route to the ED.  In the ED, tachycardic, hypertensive, tachypneic. Placed on Bipap.  Labs notable for WBC 15.6k, lactate 5.4 (repeat  2.2), troponin 35 (no chest pain), NT-pro-BNP 4180.  Negative for Covid-19, flu A and B.  Chest xray showed pulmonary edema.  Patient admitted to stepdown unit on hospitalist service.  Patient was diuresed with net fluid balance of -2.3 L.  Taken off Bipap by following morning and maintaining oxygen sat on 2 L/min oxygen by nasal cannula.  Downgraded to med/surg status.  Subjective 2/13: Patient seen and examined this morning at bedside.  No acute events reported overnight.  She reports left-sided rib cage pain continues.  Feels swollen in the area, no trauma or injury.  States her breathing is improved.  Continues with intermittent cough.  Denies fevers or chills, chest pain, nausea vomiting diarrhea or other acute complaints.  Again states she is hopeful to remain out of the hospital long enough to have her mitral valve replacement surgery.  Assessment & Plan:   Principal Problem:   Acute on chronic respiratory failure with hypoxia (HCC) Active Problems:   CAD (coronary artery disease)   Prediabetes   Acute on chronic systolic CHF (congestive heart failure) (HCC)   Hypertension   Hyperlipidemia   COPD exacerbation (HCC)   Hyperkalemia   Elevated troponin   Acute renal failure  superimposed on stage 3a chronic kidney disease (HCC)   Lactic acidosis   Acute on chronic respiratory failure with hypoxia (HCC):Likely due to combination of COPD and CHF exacerbation. Presented with diffusedrhonchi and expiratory wheezing on exam, consistent with COPD exacerbation. Also presented with elevated BNP 4180 and pulmonary edema chest x-ray, consistent with CHF exacerbation.  --supplemental O2, maintain sat > 88% -Bronchodilators --PCCM consulted on admission and following, appreciate recs  COPD with acute exacerbation: --Solu-Medrol 40 mg IVbid -continue --Duonebs q4h, Albuterol nebs q2h PRN --Z pak(ptreceived 1 dose of cefepime in ED) --f/u Blood cultures --Mucinex for cough  --Incentive spirometry --chest PT --morphine PRN air hunger or severe pain  Acute on chronic systolic CHF (congestive heart failure) (HCC):2D echo 09/18/2019 showed EF 30-35%.  Echo this admission 2/11 EF lower at 20-25%. Patient had appt at [facility] 2/12 for planning mitral valve repair/replacment surgery which she missed due to this admission.   Net -2.3 L fluid balance after diuresis yesterday.  Respiratory status much better but continues with diffuse wheezing. --Transition to home oral Lasix 40 twice daily & monitor volume status, may need to increase --cont Entresto, Coreg, ASA --monitor volume status - daily weight, strict I/O's --low sodium & fluid restriction --tele monitoring  Elevated troponinand hx of YQI:HKVQ 35. s/p of CABG. No Chest pain. Likely due to demand ischemia Echo as above.  Patient without any chest pain.  -Trend flat - on ASA, lipitor, plavix  HTN:  -Continue home medications:Coreg, Entresto -hydralazine prn  Prediabetes with hypglycemia:A1c 5.4 on 08/13/19, but  now up to 6.7% this admission.  Presented with glucose elevated at 450, likely due to steroids. -start SSI & titrate insulin up as needed for goal 140-180 -patient should be started on  diabetes agent discharge or very closely by PCP  Hyperlipidemia -lipitor  Hyperkalemia: K 5.2. -Lokelma 5 g  Acute renal failure superimposed on stage 3a chronic kidney disease (HCC):Baseline Cre is1.2-1.3, pt's Cr is1.73 and BUN 37on admission. Likely due tocardiorenal syndrome andcontinuation of ARB, diuretics. AKI improving.   -IVF: Received 500 cc of normal saline in ED --Follow BMP --Avoid using renal toxic agents, hypotension   Lactic acidosis: lactic acid 5.4. No fever.Clinically does not seem to have sepsis. Most likely due to hypoxia and hypoperfusion due to CHF exacerbation & COPD.  Still mildly elevated at 2.3, but no sign of infection so suspect this is due to aggressive diuresis. -Patient received 500 cc of normal saline in ED -Repeat lactate with a.m. labs   DVT prophylaxis: heparin   Code Status: Full Code  Family Communication: none at bedside   Disposition Plan:  Expect d/c to prior home environment pending further clinical improvement.    Anticipate discharge home tomorrow, 2/14, if patient's volume and respiratory status are stable on oral Lasix and COPD has improved further.  Continuing IV steroids today. Coming From Home Exp DC Date 2/14 Barriers: clinical improvement, PT eval Medically Stable for Discharge? No  Consultants:   Pulmonology  Procedures:   None  Antimicrobials:   PO Zithromax     Objective: Vitals:   12/20/19 0244 12/20/19 0541 12/20/19 0549 12/20/19 0551  BP:    127/87  Pulse:    77  Resp:    16  Temp:    (!) 97.5 F (36.4 C)  TempSrc:    Oral  SpO2: 97% 96%  97%  Weight:   78.5 kg   Height:        Intake/Output Summary (Last 24 hours) at 12/20/2019 1303 Last data filed at 12/20/2019 0930 Gross per 24 hour  Intake 1540 ml  Output 600 ml  Net 940 ml   Filed Weights   12/18/19 0710 12/19/19 0500 12/20/19 0549  Weight: 75.3 kg 75.8 kg 78.5 kg    Examination:  General exam: awake, alert, no  acute distress HEENT: moist mucus membranes, hearing grossly normal  Respiratory system: Improved aeration, no crackles, expiratory wheezing still present but improved, no rhonchi, mild conversational dyspnea. Cardiovascular system: normal S1/S2, RRR, no JVD, murmurs, rubs, gallops, no pedal edema.   Central nervous system: A&O x4. no gross focal neurologic deficits, normal speech Extremities: moves all, no cyanosis, normal tone Psychiatry: normal mood, congruent affect, judgement and insight appear normal    Data Reviewed: I have personally reviewed following labs and imaging studies  CBC: Recent Labs  Lab 12/18/19 0716 12/19/19 0514  WBC 15.6* 8.4  NEUTROABS 8.8* 7.0  HGB 15.6* 13.1  HCT 46.8* 37.5  MCV 97.3 94.7  PLT 402* 270   Basic Metabolic Panel: Recent Labs  Lab 12/18/19 0716 12/18/19 2311 12/19/19 0514 12/20/19 0359  NA 134*  --  135 130*  K 5.2* 3.8 4.1 4.2  CL 96*  --  98 97*  CO2 20*  --  26 24  GLUCOSE 450*  --  193* 267*  BUN 37*  --  42* 63*  CREATININE 1.73*  --  1.44* 1.43*  CALCIUM 9.2  --  9.0 8.9  MG  --   --  2.1 2.1  PHOS  --   --  5.4*  --    GFR: Estimated Creatinine Clearance: 38.4 mL/min (A) (by C-G formula based on SCr of 1.43 mg/dL (H)). Liver Function Tests: Recent Labs  Lab 12/18/19 0716  AST 22  ALT 13  ALKPHOS 75  BILITOT 0.8  PROT 7.0  ALBUMIN 3.6   No results for input(s): LIPASE, AMYLASE in the last 168 hours. No results for input(s): AMMONIA in the last 168 hours. Coagulation Profile: No results for input(s): INR, PROTIME in the last 168 hours. Cardiac Enzymes: No results for input(s): CKTOTAL, CKMB, CKMBINDEX, TROPONINI in the last 168 hours. BNP (last 3 results) No results for input(s): PROBNP in the last 8760 hours. HbA1C: Recent Labs    12/18/19 1054 12/19/19 0514  HGBA1C 6.7* 6.7*   CBG: Recent Labs  Lab 12/19/19 1558 12/19/19 1803 12/19/19 2104 12/20/19 0746 12/20/19 1159  GLUCAP 214* 221* 174*  175* 223*   Lipid Profile: Recent Labs    12/19/19 0514  CHOL 230*  HDL 55  LDLCALC 129*  TRIG 230*  CHOLHDL 4.2   Thyroid Function Tests: No results for input(s): TSH, T4TOTAL, FREET4, T3FREE, THYROIDAB in the last 72 hours. Anemia Panel: No results for input(s): VITAMINB12, FOLATE, FERRITIN, TIBC, IRON, RETICCTPCT in the last 72 hours. Sepsis Labs: Recent Labs  Lab 12/18/19 0716 12/18/19 0915 12/20/19 0359  PROCALCITON  --  <0.10  --   LATICACIDVEN 5.4* 2.2* 2.3*    Recent Results (from the past 240 hour(s))  Culture, blood (routine x 2)     Status: None (Preliminary result)   Collection Time: 12/18/19  7:16 AM   Specimen: BLOOD  Result Value Ref Range Status   Specimen Description BLOOD LEFT HAND  Final   Special Requests   Final    BOTTLES DRAWN AEROBIC AND ANAEROBIC Blood Culture adequate volume   Culture   Final    NO GROWTH 2 DAYS Performed at [facility], [address], [place], [place] [address]    Report Status PENDING  Incomplete  Culture, blood (routine x 2)     Status: None (Preliminary result)   Collection Time: 12/18/19  7:16 AM   Specimen: BLOOD  Result Value Ref Range Status   Specimen Description BLOOD BLOOD RIGHT WRIST  Final   Special Requests   Final    BOTTLES DRAWN AEROBIC AND ANAEROBIC Blood Culture adequate volume   Culture   Final    NO GROWTH 2 DAYS Performed at [facility], [address] [place], [place] [address]    Report Status PENDING  Incomplete  Respiratory Panel by RT PCR (Flu A&B, Covid) - Nasopharyngeal Swab     Status: None   Collection Time: 12/18/19  7:17 AM   Specimen: Nasopharyngeal Swab  Result Value Ref Range Status   SARS Coronavirus 2 by RT PCR NEGATIVE NEGATIVE Final    Comment: (NOTE) SARS-CoV-2 target nucleic acids are NOT DETECTED. The SARS-CoV-2 RNA is generally detectable in upper respiratoy specimens during the acute phase of infection. The lowest concentration of SARS-CoV-2 viral  copies this assay can detect is 131 copies/mL. A negative result does not preclude SARS-Cov-2 infection and should not be used as the sole basis for treatment or other patient management decisions. A negative result may occur with  improper specimen collection/handling, submission of specimen other than nasopharyngeal swab, presence of viral mutation(s) within the areas targeted by this assay, and inadequate number of viral copies (<131 copies/mL). A negative result must be combined with clinical observations, patient  history, and epidemiological information. The expected result is Negative. Fact Sheet for Patients:  PinkCheek.be Fact Sheet for Healthcare Providers:  GravelBags.it This test is not yet ap proved or cleared by the [place] FDA and  has been authorized for detection and/or diagnosis of SARS-CoV-2 by FDA under an Emergency Use Authorization (EUA). This EUA will remain  in effect (meaning this test can be used) for the duration of the COVID-19 declaration under Section 564(b)(1) of the Act, 21 U.S.C. section 360bbb-3(b)(1), unless the authorization is terminated or revoked sooner.    Influenza A by PCR NEGATIVE NEGATIVE Final   Influenza B by PCR NEGATIVE NEGATIVE Final    Comment: (NOTE) The Xpert Xpress SARS-CoV-2/FLU/RSV assay is intended as an aid in  the diagnosis of influenza from Nasopharyngeal swab specimens and  should not be used as a sole basis for treatment. Nasal washings and  aspirates are unacceptable for Xpert Xpress SARS-CoV-2/FLU/RSV  testing. Fact Sheet for Patients: PinkCheek.be Fact Sheet for Healthcare Providers: GravelBags.it This test is not yet approved or cleared by the [place] FDA and  has been authorized for detection and/or diagnosis of SARS-CoV-2 by  FDA under an Emergency Use Authorization (EUA). This EUA will  remain  in effect (meaning this test can be used) for the duration of the  Covid-19 declaration under Section 564(b)(1) of the Act, 21  U.S.C. section 360bbb-3(b)(1), unless the authorization is  terminated or revoked. Performed at [facility], [address] [place], [place] [address]   MRSA PCR Screening     Status: None   Collection Time: 12/19/19  7:49 AM   Specimen: Nasopharyngeal  Result Value Ref Range Status   MRSA by PCR NEGATIVE NEGATIVE Final    Comment:        The GeneXpert MRSA Assay (FDA approved for NASAL specimens only), is one component of a comprehensive MRSA colonization surveillance program. It is not intended to diagnose MRSA infection nor to guide or monitor treatment for MRSA infections. Performed at [facility], [address] [place], [place] [address]          Radiology Studies: DG Chest [place] 1 View  Result Date: 12/19/2019 CLINICAL DATA:  Concern for pulmonary abscess EXAM: PORTABLE CHEST 1 VIEW COMPARISON:  Chest radiograph from one day prior. FINDINGS: Stable configuration of 3 lead right subclavian ICD and median sternotomy wires. Stable cardiomediastinal silhouette with moderate cardiomegaly. No pneumothorax. Trace right pleural effusion appears new. No left pleural effusion. No overt pulmonary edema, improved. Mild left basilar atelectasis. IMPRESSION: 1. Stable moderate cardiomegaly without overt pulmonary edema. 2. New trace right pleural effusion. 3. Mild left basilar atelectasis. Electronically Signed   By: Ilona Sorrel M.D.   On: 12/19/2019 11:43   ECHOCARDIOGRAM COMPLETE  Result Date: 12/18/2019    ECHOCARDIOGRAM REPORT   Patient Name:   MARIECLAIRE BETTENHAUSEN [facility] Date of Exam: 12/18/2019 Medical Rec #:  [id number]         Height:       63.0 in Accession #:    [id number]        Weight:       166.0 lb Date of Birth:  13-Nov-1950         BSA:          1.79 m Patient Age:    48 years          BP:           136/108 mmHg Patient  Gender: F  HR:           100 bpm. Exam Location:  [facility] Procedure: 2D Echo, Color Doppler and Cardiac Doppler Indications:     CHF- acute diastolic 161.09  History:         Patient has prior history of Echocardiogram examinations, most                  recent 11/18/2019. Pacemaker; Risk Factors:Hypertension.                  AICD,MI, mitral valve disorder.  Sonographer:     Sherrie Sport RDCS (AE) Referring Phys:  Baker Janus Soledad Gerlach NIU Diagnosing Phys: Ida Rogue MD  Sonographer Comments: Suboptimal parasternal window and no subcostal window. IMPRESSIONS  1. Left ventricular ejection fraction, by estimation, is 20 to 25%. Left ventricular ejection fraction by 2D MOD biplane is 25.3 %. The left ventricle has severely decreased function. The left ventrical demonstrates global hypokinesis. The left ventricular internal cavity size was moderately dilated. There is moderately increased left ventricular hypertrophy. Left ventricular diastolic parameters are indeterminate.  2. Right ventricular systolic function is normal. The right ventricular size is normal. There is mildly elevated pulmonary artery systolic pressure. The estimated right ventricular systolic pressure is 60.4 mmHg.  3. Left atrial size was moderately dilated.  4. The mitral valve has been repaired/replaced, very mobile anterior leaflet, posterior leaflet not well visualized. At least moderate mitral valve regurgitation. Visually there appears to be mild mitral stenosis, though mean graient estimates a higher degree of stenosis. FINDINGS  Left Ventricle: Left ventricular ejection fraction, by estimation, is 20 to 25%. Left ventricular ejection fraction by 2D MOD biplane is 25.3 % The left ventricle has severely decreased function. The left ventricle demonstrates global hypokinesis. The left ventricular internal cavity size was moderately dilated. There is moderately increased left ventricular hypertrophy. Left ventricular diastolic parameters are  indeterminate. Right Ventricle: The right ventricular size is normal. No increase in right ventricular wall thickness. Right ventricular systolic function is normal. There is mildly elevated pulmonary artery systolic pressure. The tricuspid regurgitant velocity is 2.34  m/s, and with an assumed right atrial pressure of 10 mmHg, the estimated right ventricular systolic pressure is 54.0 mmHg. Left Atrium: Left atrial size was moderately dilated. Right Atrium: Right atrial size was normal in size. Pericardium: There is no evidence of pericardial effusion. Mitral Valve: The mitral valve has been repaired/replaced. Normal mobility of the mitral valve leaflets. Moderate mitral valve regurgitation. Mild mitral valve stenosis. MV peak gradient, 26.0 mmHg. The mean mitral valve gradient is 16.0 mmHg. Tricuspid Valve: The tricuspid valve is normal in structure. Tricuspid valve regurgitation is mild . No evidence of tricuspid stenosis. Aortic Valve: The aortic valve was not well visualized. Aortic valve regurgitation is not visualized. Mild to moderate aortic valve sclerosis/calcification is present, without any evidence of aortic stenosis. Aortic valve mean gradient measures 4.3 mmHg.  Aortic valve peak gradient measures 7.1 mmHg. Aortic valve area, by VTI measures 2.09 cm. Pulmonic Valve: The pulmonic valve was normal in structure. Pulmonic valve regurgitation is not visualized. No evidence of pulmonic stenosis. Aorta: The aortic root is normal in size and structure. Venous: The inferior vena cava is normal in size with greater than 50% respiratory variability, suggesting right atrial pressure of 3 mmHg. The inferior vena cava and the hepatic vein show a normal flow pattern. IAS/Shunts: No atrial level shunt detected by color flow Doppler.  LEFT VENTRICLE PLAX 2D  Biplane EF (MOD) LVIDd:         4.59 cm         LV Biplane EF:   Left LVIDs:         3.41 cm                          ventricular LV PW:          1.71 cm                          ejection LV IVS:        1.25 cm                          fraction by LVOT diam:     2.20 cm                          2D MOD LV SV:         41.81 ml                         biplane is LV SV Index:   26.47                            25.3 % LVOT Area:     3.80 cm                                Diastology                                LV e' lateral:   7.40 cm/s LV Volumes (MOD)               LV E/e' lateral: 16.2 LV area d, A2C:  50.20 cm     LV e' medial:    5.98 cm/s LV area d, A4C:  39.30 cm     LV E/e' medial:  20.0 LV area s, A2C:  41.90 cm LV area s, A4C:  36.20 cm LV major d, A2C: 8.69 cm LV major d, A4C: 7.73 cm LV major s, A2C: 8.11 cm LV major s, A4C: 7.88 cm LV vol d, MOD    244.0 ml A2C: LV vol d, MOD    167.0 ml A4C: LV vol s, MOD    183.0 ml A2C: LV vol s, MOD    137.0 ml A4C: LV SV MOD A2C:   61.0 ml LV SV MOD A4C:   167.0 ml LV SV MOD BP:    54.0 ml RIGHT VENTRICLE RV Basal diam:  3.50 cm RV S prime:     6.64 cm/s TAPSE (M-mode): 3.2 cm LEFT ATRIUM             Index       RIGHT ATRIUM           Index LA diam:        2.60 cm 1.46 cm/m  RA Area:     22.90 cm LA Vol (A2C):   87.7 ml 49.09 ml/m RA Volume:   70.10 ml  39.24 ml/m LA Vol (A4C):   80.9 ml 45.29 ml/m LA Biplane Vol: 86.9 ml 48.64 ml/m  AORTIC  VALVE AV Area (Vmax):    1.82 cm AV Area (Vmean):   1.69 cm AV Area (VTI):     2.09 cm AV Vmax:           133.67 cm/s AV Vmean:          91.400 cm/s AV VTI:            0.200 m AV Peak Grad:      7.1 mmHg AV Mean Grad:      4.3 mmHg LVOT Vmax:         63.90 cm/s LVOT Vmean:        40.600 cm/s LVOT VTI:          0.110 m LVOT/AV VTI ratio: 0.55  AORTA Ao Root diam: 2.70 cm MITRAL VALVE                         TRICUSPID VALVE MV Area (PHT): 4.96 cm              TR Peak grad:   21.9 mmHg MV Peak grad:  26.0 mmHg             TR Vmax:        234.00 cm/s MV Mean grad:  16.0 mmHg MV Vmax:       2.55 m/s              SHUNTS MV Vmean:      183.0 cm/s             Systemic VTI:  0.11 m MV Decel Time: 153 msec              Systemic Diam: 2.20 cm MV E velocity: 119.80 cm/s 103 cm/s MV A velocity: 109.75 cm/s 70.3 cm/s MV E/A ratio:  1.09        1.5 Ida Rogue MD Electronically signed by Ida Rogue MD Signature Date/Time: 12/18/2019/6:06:18 PM    Final         Scheduled Meds: . ALPRAZolam  0.25-0.5 mg Oral QHS  . aspirin EC  81 mg Oral Daily  . atorvastatin  40 mg Oral Q0600  . carvedilol  6.25 mg Oral BID WC  . Chlorhexidine Gluconate Cloth  6 each Topical Daily  . clopidogrel  75 mg Oral Daily  . cyanocobalamin  1,000 mcg Intramuscular Q30 days  . dextromethorphan-guaiFENesin  1 tablet Oral BID  . escitalopram  10 mg Oral Daily  . febuxostat  40 mg Oral Daily  . ferrous sulfate  325 mg Oral Q breakfast  . fluticasone  1 puff Inhalation Daily   And  . umeclidinium-vilanterol  1 puff Inhalation Daily  . furosemide  40 mg Oral BID  . heparin  5,000 Units Subcutaneous Q8H  . insulin aspart  0-5 Units Subcutaneous QHS  . insulin aspart  0-9 Units Subcutaneous TID WC  . insulin aspart  3 Units Intravenous Once  . ipratropium-albuterol  3 mL Nebulization TID  . magnesium citrate  1 Bottle Oral Once  . mouth rinse  15 mL Mouth Rinse BID  . methylPREDNISolone (SOLU-MEDROL) injection  40 mg Intravenous BID  . pantoprazole  40 mg Oral QAC breakfast  . polyethylene glycol  17 g Oral Daily  . sacubitril-valsartan  1 tablet Oral BID  . sodium chloride flush  3 mL Intravenous Q12H  . traZODone  50 mg Oral Daily   Continuous Infusions: . sodium chloride       LOS:  2 days    Time spent: 30 minutes    Ezekiel Slocumb, DO [facility]   If 7PM-7AM, please contact night-coverage [email or web address] 12/20/2019, 1:03 PM

## 2019-12-20 NOTE — Evaluation (Signed)
Physical Therapy Evaluation Patient Details Name: Ashley [name] MRN: [id number] DOB: [birth year] Today's Date: 12/20/2019   History of Present Illness  69 yo female with onset of SOB and cough was seen in ED, admitted.   O2 sats on room air were reduced to 80% range, but improved with resp therapy and CPAP.  PMHx:  pacemaker, CKD, HTN, HLD, COPD, GERD, gout, depression, anxiety, former smoker, AICD, CAD, CABG, colon CA, CHF with EF 30-35%,   Clinical Impression  Pt was seen for mobility and assessment of O2 sats with gait, and note 97% with room air after gait.  Her fatigue level is there, asking to return to bed to rest.  Pt is appropriate due to minor strength changes for Home PT and will follow acutely as well for these needs.  Balance and endurance will be progressed with pt being assessed for sats and HR, and will expect these measures to carry over to home therapy.  Pt is agreeable with plan to keep therapy going in acute setting.    Follow Up Recommendations Home health PT    Equipment Recommendations  None recommended by PT    Recommendations for Other Services       Precautions / Restrictions Precautions Precautions: Fall Precaution Comments: monitor O2 sats with gait Restrictions Weight Bearing Restrictions: No      Mobility  Bed Mobility Overal bed mobility: Modified Independent                Transfers Overall transfer level: Needs assistance Equipment used: 1 person hand held assist Transfers: Sit to/from Stand Sit to Stand: Supervision            Ambulation/Gait Ambulation/Gait assistance: Min guard Gait Distance (Feet): 300 Feet Assistive device: 1 person hand held assist Gait Pattern/deviations: Step-through pattern;Decreased stride length Gait velocity: controlled Gait velocity interpretation: <1.31 ft/sec, indicative of household ambulator General Gait Details: pt was seen for gait on hall with no assistive device, with controlled balance  but low endurance, O2 sat was 97% post gait  Stairs            Wheelchair Mobility    Modified Rankin (Stroke Patients Only)       Balance Overall balance assessment: Needs assistance Sitting-balance support: Feet supported Sitting balance-Leahy Scale: Good     Standing balance support: No upper extremity supported Standing balance-Leahy Scale: Fair                               Pertinent Vitals/Pain Pain Assessment: Faces Faces Pain Scale: Hurts little more Pain Location: ribcage Pain Descriptors / Indicators: Aching Pain Intervention(s): Monitored during session;Repositioned    Home Living Family/patient expects to be discharged to:: Private residence Living Arrangements: Spouse/significant other Available Help at Discharge: Family;Available 24 hours/day Type of Home: House Home Access: Stairs to enter Entrance Stairs-Rails: Can reach both Entrance Stairs-Number of Steps: 4 Home Layout: One level Home Equipment: Walker - 2 wheels;Cane - single point      Prior Function Level of Independence: Independent with assistive device(s)         Comments: has used RW at times     Hand Dominance   Dominant Hand: Right    Extremity/Trunk Assessment   Upper Extremity Assessment Upper Extremity Assessment: Overall WFL for tasks assessed    Lower Extremity Assessment Lower Extremity Assessment: Overall WFL for tasks assessed    Cervical / Trunk Assessment Cervical / Trunk Assessment:  Normal  Communication   Communication: No difficulties  Cognition Arousal/Alertness: Awake/alert Behavior During Therapy: WFL for tasks assessed/performed Overall Cognitive Status: Within Functional Limits for tasks assessed                                        General Comments General comments (skin integrity, edema, etc.): walked with no Assistive device, able to maintain sats on room air    Exercises     Assessment/Plan    PT  Assessment Patient needs continued PT services  PT Problem List Decreased strength;Decreased range of motion;Decreased activity tolerance;Decreased balance;Decreased mobility;Decreased coordination;Decreased knowledge of use of DME;Decreased safety awareness;Cardiopulmonary status limiting activity       PT Treatment Interventions DME instruction;Gait training;Stair training;Functional mobility training;Therapeutic activities;Therapeutic exercise;Balance training;Neuromuscular re-education;Patient/family education    PT Goals (Current goals can be found in the Care Plan section)  Acute Rehab PT Goals Patient Stated Goal: to get stronger PT Goal Formulation: With patient Time For Goal Achievement: 01/03/20 Potential to Achieve Goals: Good    Frequency Min 2X/week   Barriers to discharge Inaccessible home environment help to climb stairs is available    Co-evaluation               AM-PAC PT "6 Clicks" Mobility  Outcome Measure Help needed turning from your back to your side while in a flat bed without using bedrails?: A Little Help needed moving from lying on your back to sitting on the side of a flat bed without using bedrails?: A Little Help needed moving to and from a bed to a chair (including a wheelchair)?: A Little Help needed standing up from a chair using your arms (e.g., wheelchair or bedside chair)?: A Little Help needed to walk in hospital room?: A Little Help needed climbing 3-5 steps with a railing? : A Lot 6 Click Score: 17    End of Session Equipment Utilized During Treatment: Gait belt Activity Tolerance: Patient tolerated treatment well;Patient limited by fatigue Patient left: in bed;with call bell/phone within reach Nurse Communication: Mobility status PT Visit Diagnosis: Unsteadiness on feet (R26.81);Muscle weakness (generalized) (M62.81)    Time: 1607-3710 PT Time Calculation (min) (ACUTE ONLY): 18 min   Charges:   PT Evaluation $PT Eval Moderate  Complexity: 1 Mod         Ramond Dial 12/20/2019, 3:48 PM   Mee Hives, PT MS Acute Rehab Dept. Number: [place] and [place]

## 2019-12-20 NOTE — Progress Notes (Signed)
Patient refused tonight due to taking laxative. RN will call if changes mind.

## 2019-12-20 NOTE — Progress Notes (Signed)
bipap unit has been pulled. Patient states she doesn't need. Her breathing has improved and she is resting fine at night

## 2019-12-20 NOTE — Plan of Care (Signed)
Continuing with plan of care. 

## 2019-12-21 ENCOUNTER — Inpatient Hospital Stay: Payer: Medicare Other

## 2019-12-21 LAB — BASIC METABOLIC PANEL
Anion gap: 7 (ref 5–15)
BUN: 64 mg/dL — ABNORMAL HIGH (ref 8–23)
CO2: 30 mmol/L (ref 22–32)
Calcium: 9 mg/dL (ref 8.9–10.3)
Chloride: 98 mmol/L (ref 98–111)
Creatinine, Ser: 1.39 mg/dL — ABNORMAL HIGH (ref 0.44–1.00)
GFR calc Af Amer: 46 mL/min — ABNORMAL LOW (ref >60)
GFR calc non Af Amer: 39 mL/min — ABNORMAL LOW (ref >60)
Glucose, Bld: 196 mg/dL — ABNORMAL HIGH (ref 70–99)
Potassium: 5.4 mmol/L — ABNORMAL HIGH (ref 3.5–5.1)
Sodium: 135 mmol/L (ref 135–145)

## 2019-12-21 LAB — GLUCOSE, CAPILLARY
Glucose-Capillary: 187 mg/dL — ABNORMAL HIGH (ref 70–99)
Glucose-Capillary: 262 mg/dL — ABNORMAL HIGH (ref 70–99)
Glucose-Capillary: 286 mg/dL — ABNORMAL HIGH (ref 70–99)

## 2019-12-21 LAB — LACTIC ACID, PLASMA: Lactic Acid, Venous: 2.5 mmol/L (ref 0.5–1.9)

## 2019-12-21 MED ORDER — ALBUTEROL SULFATE HFA 108 (90 BASE) MCG/ACT IN AERS: INHALATION_SPRAY | RESPIRATORY_TRACT | 2 refills | Status: DC

## 2019-12-21 MED ORDER — MORPHINE SULFATE 15 MG PO TABS: 15.0000 mg | ORAL_TABLET | Freq: Four times a day (QID) | ORAL | 0 refills | Status: AC | PRN

## 2019-12-21 MED ORDER — DM-GUAIFENESIN ER 30-600 MG PO TB12: 1.0000 | ORAL_TABLET | Freq: Two times a day (BID) | ORAL | 0 refills | Status: AC | PRN

## 2019-12-21 MED ORDER — PREDNISONE 20 MG PO TABS: 40.0000 mg | ORAL_TABLET | Freq: Every day | ORAL | 0 refills | Status: AC

## 2019-12-21 MED ORDER — PREDNISONE 20 MG PO TABS
40.0000 mg | ORAL_TABLET | Freq: Every day | ORAL | Status: DC
  Administered 2019-12-21: 08:00:00 40 mg via ORAL
  Filled 2019-12-21: qty 2

## 2019-12-21 NOTE — Discharge Summary (Signed)
Physician Discharge Summary  Ashley [name] ZOX:[id number] DOB: Feb 25, 1951 DOA: 12/18/2019  PCP: [name], MD  Admit date: 12/18/2019 Discharge date: 12/21/2019  Admitted From: Home Disposition: Home  Recommendations for Outpatient Follow-up:  1. Follow up with PCP in 1-2 weeks 2. Please obtain BMP/CBC in 2-3 days 3. Entresto and potassium supplement were held day of discharge due to potassium 5.4. Please re-check potassium and resume if hyperkalemia resolved. 4. Follow-up with cardiology as previously scheduled    Home Health: No, HHPT recommended but patient declines Equipment/Devices: None   Discharge Condition: Stable CODE STATUS: Full Diet recommendation: Low sodium, 2 g or less per day  Brief/Interim Summary:  Ashley [name] a 69 y.o.femalewith medical history significant ofhypertension, hyperlipidemia, prediabetes COPD, GERD, gout, depression with anxiety, former smoker, AICD placement, CAD, CABG, colon cancer,sCHF with EF 30-35%, CKD stage III, who presented to the ED on 2/11with progressiveshortness of breath.Associated dry cough, nausea without vomiting. EMS found patient with O2 sat in upper 80's on room air. EMS placed on CPAP en route to the ED. In the ED, tachycardic, hypertensive, tachypneic. Placed on Bipap. Labs notable for WBC 15.6k, lactate 5.4 (repeat 2.2), troponin 35 (no chest pain), NT-pro-BNP 4180. Negative for Covid-19, flu A and B. Chest xray showed pulmonary edema. Patient admitted to stepdown unit on hospitalist service. Patient was diuresed with net fluid balance of -2.3 L. Taken off Bipap by following morning and maintaining oxygen sat on 2 L/min oxygen by nasal cannula.  Treated with IV steroids, bronchodilators, antitussives, incentive spirometry and chest PT.  Morphine was used initially for air hunger with significant improvement, per patient. Downgraded to med/surg status.  Subsequently weaned to room air maintaining O2 sats  well.  Patient had significant left-sided rib cage pain during admission, no injury or trauma.  She had been coughing significantly prior to and early this admission, suspect this is musculoskeletal pain from coughing.  Rib x-ray obtained prior to discharge and was negative for fracture.  Patient clinically improved and stable for discharge home today.  She is to continue prednisone for 2 more days, continue home dose Lasix.  Physical therapy evaluated patient recommended home health physical therapy but patient declined stating she has plenty of support from family at home.  Of note, patient's potassium this morning was 5.4, Entresto was held.  Patient was advised to hold Entresto for the next couple of days until PCP rechecks her labs this week.  She is to monitor blood pressure at home while this is held as well.  She expressed understanding and agreement.   Discharge Diagnoses: Principal Problem:   Acute on chronic respiratory failure with hypoxia (HCC) Active Problems:   CAD (coronary artery disease)   Prediabetes   Acute on chronic systolic CHF (congestive heart failure) (HCC)   Hypertension   Hyperlipidemia   COPD exacerbation (HCC)   Hyperkalemia   Elevated troponin   Acute renal failure superimposed on stage 3a chronic kidney disease (HCC)   Lactic acidosis    Discharge Instructions   Discharge Instructions    (HEART FAILURE PATIENTS) Call MD:  Anytime you have any of the following symptoms: 1) 3 pound weight gain in 24 hours or 5 pounds in 1 week 2) shortness of breath, with or without a dry hacking cough 3) swelling in the hands, feet or stomach 4) if you have to sleep on extra pillows at night in order to breathe.   Complete by: As directed    Call MD for:  Complete by: As directed    Worsening shortness of breath   Call MD for:  extreme fatigue   Complete by: As directed    Call MD for:  temperature >100.4   Complete by: As directed    Diet - low sodium heart healthy    Complete by: As directed    Discharge instructions   Complete by: As directed    Your potassium was a little high this morning.   Please HOLD ENTRESTO for the next 2-3 days until PCP re-checks your labs. Also do not take your potassium.   Increase activity slowly   Complete by: As directed      Allergies as of 12/21/2019      Reactions   Vancomycin Nausea And Vomiting, Palpitations   Hydrocodone-acetaminophen Nausea Only      Medication List    STOP taking these medications   doxycycline 100 MG tablet Commonly known as: VIBRA-TABS   potassium chloride 10 MEQ tablet Commonly known as: KLOR-CON     TAKE these medications   albuterol 108 (90 Base) MCG/ACT inhaler Commonly known as: Ventolin HFA INHALE 2 PUFFS BY MOUTH INTO THE LUNGS EVERY 3 HOURS AS NEEDED FOR WHEEZING What changed: additional instructions   ALPRAZolam 0.5 MG tablet Commonly known as: XANAX Take 0.5-1 tablets (0.25-0.5 mg total) by mouth at bedtime. TAKE ONE TABLET BY MOUTH AT BEDTIME AS NEEDED FOR ANXIETY What changed: additional instructions   aspirin 81 MG tablet Take 81 mg by mouth daily.   atorvastatin 40 MG tablet Commonly known as: LIPITOR TAKE ONE TABLET BY MOUTH ONCE DAILY What changed: when to take this   carvedilol 6.25 MG tablet Commonly known as: COREG Take 1 tablet (6.25 mg total) by mouth 2 (two) times daily with a meal.   clopidogrel 75 MG tablet Commonly known as: PLAVIX Take 1 tablet (75 mg total) by mouth daily.   cyanocobalamin 1000 MCG/ML injection Commonly known as: (VITAMIN B-12) Inject 1 mL into the muscle monthly What changed:   how much to take  how to take this  when to take this  additional instructions   dextromethorphan-guaiFENesin 30-600 MG 12hr tablet Commonly known as: MUCINEX DM Take 1 tablet by mouth 2 (two) times daily as needed for up to 7 days for cough.   Diphenhist 25 mg capsule Generic drug: diphenhydrAMINE Take 25 mg by mouth at bedtime as  needed for allergies or sleep.   Entresto 24-26 MG Generic drug: sacubitril-valsartan Take 1 tablet by mouth 2 (two) times daily.   escitalopram 10 MG tablet Commonly known as: LEXAPRO Take 1 tablet (10 mg total) by mouth daily.   febuxostat 40 MG tablet Commonly known as: Uloric Take 1 tablet (40 mg total) by mouth daily.   ferrous sulfate 325 (65 FE) MG EC tablet Take 325 mg by mouth daily with breakfast.   furosemide 40 MG tablet Commonly known as: LASIX Take 1 tablet (40 mg total) by mouth 2 (two) times daily.   ipratropium-albuterol 0.5-2.5 (3) MG/3ML Soln Commonly known as: DUONEB Take 3 mLs by nebulization every 6 (six) hours as needed.   morphine 15 MG tablet Commonly known as: MSIR Take 1 tablet (15 mg total) by mouth every 6 (six) hours as needed for up to 5 days (rib cage pain or "air hunger").   nitroGLYCERIN 0.4 MG SL tablet Commonly known as: NITROSTAT Place 1 tablet (0.4 mg total) under the tongue every 5 (five) minutes as needed for chest pain.  omeprazole 40 MG capsule Commonly known as: PRILOSEC Take 1 capsule (40 mg total) by mouth daily.   predniSONE 20 MG tablet Commonly known as: DELTASONE Take 2 tablets (40 mg total) by mouth daily with breakfast for 2 days. Start taking on: December 22, 2019 What changed:   medication strength  how much to take  how to take this  when to take this  additional instructions   spironolactone 25 MG tablet Commonly known as: Aldactone Take 0.5 tablets (12.5 mg total) by mouth daily.   traZODone 50 MG tablet Commonly known as: DESYREL Take 0.5-1 tablets (25-50 mg total) by mouth daily. One hour before bedtime What changed: how much to take   Trelegy Ellipta 100-62.5-25 MCG/INH Aepb Generic drug: Fluticasone-Umeclidin-Vilant Inhale 1 puff into the lungs daily.      Follow-up Information    [facility] Follow up on 12/29/2019.   Specialty: Cardiology Why:  at 1:00pm. Enter through the [facility] [place] entrance Contact information: [address] [place] [phone]       [name], MD. Schedule an appointment as soon as possible for a visit in 5 day(s).   Specialty: Internal Medicine Why: Please follow up with PCP 3-5 days after discharge.  Request BMP check in 1-3 days for potassium level. Contact information: [address] [place]  [id number] [phone]          Allergies  Allergen Reactions  . Vancomycin Nausea And Vomiting and Palpitations  . Hydrocodone-Acetaminophen Nausea Only    Consultations:  None   Procedures/Studies: DG Chest Port 1 View  Result Date: 12/19/2019 CLINICAL DATA:  Concern for pulmonary abscess EXAM: PORTABLE CHEST 1 VIEW COMPARISON:  Chest radiograph from one day prior. FINDINGS: Stable configuration of 3 lead right subclavian ICD and median sternotomy wires. Stable cardiomediastinal silhouette with moderate cardiomegaly. No pneumothorax. Trace right pleural effusion appears new. No left pleural effusion. No overt pulmonary edema, improved. Mild left basilar atelectasis. IMPRESSION: 1. Stable moderate cardiomegaly without overt pulmonary edema. 2. New trace right pleural effusion. 3. Mild left basilar atelectasis. Electronically Signed   By: Ilona Sorrel M.D.   On: 12/19/2019 11:43   DG Chest Portable 1 View  Result Date: 12/18/2019 CLINICAL DATA:  Respiratory distress EXAM: PORTABLE CHEST 1 VIEW COMPARISON:  11/18/2019 FINDINGS: Cardiomegaly with biventricular pacer and CABG. Diffuse interstitial opacity with Kerley lines. No visible effusion or air leak. There is artifact from hardware. IMPRESSION: CHF. Electronically Signed   By: Monte Fantasia M.D.   On: 12/18/2019 07:41   ECHOCARDIOGRAM COMPLETE  Result Date: 12/18/2019    ECHOCARDIOGRAM REPORT   Patient Name:   KATRINE RADICH [facility] Date of Exam: 12/18/2019 Medical Rec #:  [id number]          Height:       63.0 in Accession #:    [id number]        Weight:       166.0 lb Date of Birth:  02-Jul-1951         BSA:          1.79 m Patient Age:    30 years          BP:           136/108 mmHg Patient Gender: F                 HR:  100 bpm. Exam Location:  [facility] Procedure: 2D Echo, Color Doppler and Cardiac Doppler Indications:     CHF- acute diastolic 144.81  History:         Patient has prior history of Echocardiogram examinations, most                  recent 11/18/2019. Pacemaker; Risk Factors:Hypertension.                  AICD,MI, mitral valve disorder.  Sonographer:     Sherrie Sport RDCS (AE) Referring Phys:  Baker Janus Soledad Gerlach NIU Diagnosing Phys: Ida Rogue MD  Sonographer Comments: Suboptimal parasternal window and no subcostal window. IMPRESSIONS  1. Left ventricular ejection fraction, by estimation, is 20 to 25%. Left ventricular ejection fraction by 2D MOD biplane is 25.3 %. The left ventricle has severely decreased function. The left ventrical demonstrates global hypokinesis. The left ventricular internal cavity size was moderately dilated. There is moderately increased left ventricular hypertrophy. Left ventricular diastolic parameters are indeterminate.  2. Right ventricular systolic function is normal. The right ventricular size is normal. There is mildly elevated pulmonary artery systolic pressure. The estimated right ventricular systolic pressure is 85.6 mmHg.  3. Left atrial size was moderately dilated.  4. The mitral valve has been repaired/replaced, very mobile anterior leaflet, posterior leaflet not well visualized. At least moderate mitral valve regurgitation. Visually there appears to be mild mitral stenosis, though mean graient estimates a higher degree of stenosis. FINDINGS  Left Ventricle: Left ventricular ejection fraction, by estimation, is 20 to 25%. Left ventricular ejection fraction by 2D MOD biplane is 25.3 % The left ventricle has severely decreased function. The left  ventricle demonstrates global hypokinesis. The left ventricular internal cavity size was moderately dilated. There is moderately increased left ventricular hypertrophy. Left ventricular diastolic parameters are indeterminate. Right Ventricle: The right ventricular size is normal. No increase in right ventricular wall thickness. Right ventricular systolic function is normal. There is mildly elevated pulmonary artery systolic pressure. The tricuspid regurgitant velocity is 2.34  m/s, and with an assumed right atrial pressure of 10 mmHg, the estimated right ventricular systolic pressure is 31.4 mmHg. Left Atrium: Left atrial size was moderately dilated. Right Atrium: Right atrial size was normal in size. Pericardium: There is no evidence of pericardial effusion. Mitral Valve: The mitral valve has been repaired/replaced. Normal mobility of the mitral valve leaflets. Moderate mitral valve regurgitation. Mild mitral valve stenosis. MV peak gradient, 26.0 mmHg. The mean mitral valve gradient is 16.0 mmHg. Tricuspid Valve: The tricuspid valve is normal in structure. Tricuspid valve regurgitation is mild . No evidence of tricuspid stenosis. Aortic Valve: The aortic valve was not well visualized. Aortic valve regurgitation is not visualized. Mild to moderate aortic valve sclerosis/calcification is present, without any evidence of aortic stenosis. Aortic valve mean gradient measures 4.3 mmHg.  Aortic valve peak gradient measures 7.1 mmHg. Aortic valve area, by VTI measures 2.09 cm. Pulmonic Valve: The pulmonic valve was normal in structure. Pulmonic valve regurgitation is not visualized. No evidence of pulmonic stenosis. Aorta: The aortic root is normal in size and structure. Venous: The inferior vena cava is normal in size with greater than 50% respiratory variability, suggesting right atrial pressure of 3 mmHg. The inferior vena cava and the hepatic vein show a normal flow pattern. IAS/Shunts: No atrial level shunt detected  by color flow Doppler.  LEFT VENTRICLE PLAX 2D  Biplane EF (MOD) LVIDd:         4.59 cm         LV Biplane EF:   Left LVIDs:         3.41 cm                          ventricular LV PW:         1.71 cm                          ejection LV IVS:        1.25 cm                          fraction by LVOT diam:     2.20 cm                          2D MOD LV SV:         41.81 ml                         biplane is LV SV Index:   26.47                            25.3 % LVOT Area:     3.80 cm                                Diastology                                LV e' lateral:   7.40 cm/s LV Volumes (MOD)               LV E/e' lateral: 16.2 LV area d, A2C:  50.20 cm     LV e' medial:    5.98 cm/s LV area d, A4C:  39.30 cm     LV E/e' medial:  20.0 LV area s, A2C:  41.90 cm LV area s, A4C:  36.20 cm LV major d, A2C: 8.69 cm LV major d, A4C: 7.73 cm LV major s, A2C: 8.11 cm LV major s, A4C: 7.88 cm LV vol d, MOD    244.0 ml A2C: LV vol d, MOD    167.0 ml A4C: LV vol s, MOD    183.0 ml A2C: LV vol s, MOD    137.0 ml A4C: LV SV MOD A2C:   61.0 ml LV SV MOD A4C:   167.0 ml LV SV MOD BP:    54.0 ml RIGHT VENTRICLE RV Basal diam:  3.50 cm RV S prime:     6.64 cm/s TAPSE (M-mode): 3.2 cm LEFT ATRIUM             Index       RIGHT ATRIUM           Index LA diam:        2.60 cm 1.46 cm/m  RA Area:     22.90 cm LA Vol (A2C):   87.7 ml 49.09 ml/m RA Volume:   70.10 ml  39.24 ml/m LA Vol (A4C):   80.9 ml 45.29 ml/m LA Biplane Vol: 86.9 ml 48.64 ml/m  AORTIC VALVE  AV Area (Vmax):    1.82 cm AV Area (Vmean):   1.69 cm AV Area (VTI):     2.09 cm AV Vmax:           133.67 cm/s AV Vmean:          91.400 cm/s AV VTI:            0.200 m AV Peak Grad:      7.1 mmHg AV Mean Grad:      4.3 mmHg LVOT Vmax:         63.90 cm/s LVOT Vmean:        40.600 cm/s LVOT VTI:          0.110 m LVOT/AV VTI ratio: 0.55  AORTA Ao Root diam: 2.70 cm MITRAL VALVE                         TRICUSPID VALVE MV Area (PHT): 4.96 cm               TR Peak grad:   21.9 mmHg MV Peak grad:  26.0 mmHg             TR Vmax:        234.00 cm/s MV Mean grad:  16.0 mmHg MV Vmax:       2.55 m/s              SHUNTS MV Vmean:      183.0 cm/s            Systemic VTI:  0.11 m MV Decel Time: 153 msec              Systemic Diam: 2.20 cm MV E velocity: 119.80 cm/s 103 cm/s MV A velocity: 109.75 cm/s 70.3 cm/s MV E/A ratio:  1.09        1.5 Ida Rogue MD Electronically signed by Ida Rogue MD Signature Date/Time: 12/18/2019/6:06:18 PM    Final        Subjective: Patient seen and examined this morning.  No acute events reported overnight.  She reports feeling well this morning.  Her only complaint is that persistent left-sided rib cage pain.  Denies fevers or chills, chest pain or shortness of breath.   Discharge Exam: Vitals:   12/21/19 0752 12/21/19 0810  BP:    Pulse:    Resp:    Temp:    SpO2: 94% 96%   Vitals:   12/20/19 2044 12/21/19 0516 12/21/19 0752 12/21/19 0810  BP:  140/83    Pulse:  67    Resp:  18    Temp:  97.7 F (36.5 C)    TempSrc:  Oral    SpO2: 95% 96% 94% 96%  Weight:  78.3 kg    Height:        General: Pt is alert, awake, not in acute distress Cardiovascular: RRR, S1/S2 +, no rubs, no gallops Respiratory: CTA bilaterally, no wheezing, no rhonchi Abdominal: Soft, NT, ND, bowel sounds + Extremities: no edema, no cyanosis    The results of significant diagnostics from this hospitalization (including imaging, microbiology, ancillary and laboratory) are listed below for reference.     Microbiology: Recent Results (from the past 240 hour(s))  Culture, blood (routine x 2)     Status: None (Preliminary result)   Collection Time: 12/18/19  7:16 AM   Specimen: BLOOD  Result Value Ref Range Status   Specimen Description BLOOD LEFT HAND  Final   Special Requests  Final    BOTTLES DRAWN AEROBIC AND ANAEROBIC Blood Culture adequate volume   Culture   Final    NO GROWTH 3 DAYS Performed at [facility], [address] [place], [place] [address]    Report Status PENDING  Incomplete  Culture, blood (routine x 2)     Status: None (Preliminary result)   Collection Time: 12/18/19  7:16 AM   Specimen: BLOOD  Result Value Ref Range Status   Specimen Description BLOOD BLOOD RIGHT WRIST  Final   Special Requests   Final    BOTTLES DRAWN AEROBIC AND ANAEROBIC Blood Culture adequate volume   Culture   Final    NO GROWTH 3 DAYS Performed at [facility], [address], [place], [place] [address]    Report Status PENDING  Incomplete  Respiratory Panel by RT PCR (Flu A&B, Covid) - Nasopharyngeal Swab     Status: None   Collection Time: 12/18/19  7:17 AM   Specimen: Nasopharyngeal Swab  Result Value Ref Range Status   SARS Coronavirus 2 by RT PCR NEGATIVE NEGATIVE Final    Comment: (NOTE) SARS-CoV-2 target nucleic acids are NOT DETECTED. The SARS-CoV-2 RNA is generally detectable in upper respiratoy specimens during the acute phase of infection. The lowest concentration of SARS-CoV-2 viral copies this assay can detect is 131 copies/mL. A negative result does not preclude SARS-Cov-2 infection and should not be used as the sole basis for treatment or other patient management decisions. A negative result may occur with  improper specimen collection/handling, submission of specimen other than nasopharyngeal swab, presence of viral mutation(s) within the areas targeted by this assay, and inadequate number of viral copies (<131 copies/mL). A negative result must be combined with clinical observations, patient history, and epidemiological information. The expected result is Negative. Fact Sheet for Patients:  PinkCheek.be Fact Sheet for Healthcare Providers:  GravelBags.it This test is not yet ap proved or cleared by the [place] FDA and  has been authorized for detection and/or diagnosis of SARS-CoV-2 by FDA  under an Emergency Use Authorization (EUA). This EUA will remain  in effect (meaning this test can be used) for the duration of the COVID-19 declaration under Section 564(b)(1) of the Act, 21 U.S.C. section 360bbb-3(b)(1), unless the authorization is terminated or revoked sooner.    Influenza A by PCR NEGATIVE NEGATIVE Final   Influenza B by PCR NEGATIVE NEGATIVE Final    Comment: (NOTE) The Xpert Xpress SARS-CoV-2/FLU/RSV assay is intended as an aid in  the diagnosis of influenza from Nasopharyngeal swab specimens and  should not be used as a sole basis for treatment. Nasal washings and  aspirates are unacceptable for Xpert Xpress SARS-CoV-2/FLU/RSV  testing. Fact Sheet for Patients: PinkCheek.be Fact Sheet for Healthcare Providers: GravelBags.it This test is not yet approved or cleared by the [place] FDA and  has been authorized for detection and/or diagnosis of SARS-CoV-2 by  FDA under an Emergency Use Authorization (EUA). This EUA will remain  in effect (meaning this test can be used) for the duration of the  Covid-19 declaration under Section 564(b)(1) of the Act, 21  U.S.C. section 360bbb-3(b)(1), unless the authorization is  terminated or revoked. Performed at [facility], [address] [place], [place] [address]   MRSA PCR Screening     Status: None   Collection Time: 12/19/19  7:49 AM   Specimen: Nasopharyngeal  Result Value Ref Range Status   MRSA by PCR NEGATIVE NEGATIVE Final  Comment:        The GeneXpert MRSA Assay (FDA approved for NASAL specimens only), is one component of a comprehensive MRSA colonization surveillance program. It is not intended to diagnose MRSA infection nor to guide or monitor treatment for MRSA infections. Performed at [facility], [address] [place], [place] [address]      Labs: BNP (last 3 results) Recent Labs    10/15/19 1417  11/18/19 0005 12/18/19 0716  BNP >4,500.0* 4,403.0* 1,093.2*   Basic Metabolic Panel: Recent Labs  Lab 12/18/19 0716 12/18/19 2311 12/19/19 0514 12/20/19 0359 12/21/19 0448  NA 134*  --  135 130* 135  K 5.2* 3.8 4.1 4.2 5.4*  CL 96*  --  98 97* 98  CO2 20*  --  26 24 30   GLUCOSE 450*  --  193* 267* 196*  BUN 37*  --  42* 63* 64*  CREATININE 1.73*  --  1.44* 1.43* 1.39*  CALCIUM 9.2  --  9.0 8.9 9.0  MG  --   --  2.1 2.1  --   PHOS  --   --  5.4*  --   --    Liver Function Tests: Recent Labs  Lab 12/18/19 0716  AST 22  ALT 13  ALKPHOS 75  BILITOT 0.8  PROT 7.0  ALBUMIN 3.6   No results for input(s): LIPASE, AMYLASE in the last 168 hours. No results for input(s): AMMONIA in the last 168 hours. CBC: Recent Labs  Lab 12/18/19 0716 12/19/19 0514  WBC 15.6* 8.4  NEUTROABS 8.8* 7.0  HGB 15.6* 13.1  HCT 46.8* 37.5  MCV 97.3 94.7  PLT 402* 200   Cardiac Enzymes: No results for input(s): CKTOTAL, CKMB, CKMBINDEX, TROPONINI in the last 168 hours. BNP: Invalid input(s): POCBNP CBG: Recent Labs  Lab 12/20/19 0746 12/20/19 1159 12/20/19 1656 12/20/19 2111 12/21/19 0738  GLUCAP 175* 223* 170* 225* 187*   D-Dimer No results for input(s): DDIMER in the last 72 hours. Hgb A1c Recent Labs    12/18/19 1054 12/19/19 0514  HGBA1C 6.7* 6.7*   Lipid Profile Recent Labs    12/19/19 0514  CHOL 230*  HDL 55  LDLCALC 129*  TRIG 230*  CHOLHDL 4.2   Thyroid function studies No results for input(s): TSH, T4TOTAL, T3FREE, THYROIDAB in the last 72 hours.  Invalid input(s): FREET3 Anemia work up No results for input(s): VITAMINB12, FOLATE, FERRITIN, TIBC, IRON, RETICCTPCT in the last 72 hours. Urinalysis    Component Value Date/Time   COLORURINE YELLOW (A) 12/18/2019 0713   APPEARANCEUR CLEAR (A) 12/18/2019 0713   APPEARANCEUR Clear 08/19/2014 0025   LABSPEC 1.016 12/18/2019 0713   LABSPEC 1.009 08/19/2014 0025   PHURINE 5.0 12/18/2019 0713   GLUCOSEU  NEGATIVE 12/18/2019 0713   GLUCOSEU 50 mg/dL 08/19/2014 0025   HGBUR NEGATIVE 12/18/2019 0713   BILIRUBINUR NEGATIVE 12/18/2019 0713   BILIRUBINUR Negative 08/19/2014 0025   KETONESUR NEGATIVE 12/18/2019 0713   PROTEINUR NEGATIVE 12/18/2019 0713   NITRITE NEGATIVE 12/18/2019 0713   LEUKOCYTESUR NEGATIVE 12/18/2019 0713   LEUKOCYTESUR Negative 08/19/2014 0025   Sepsis Labs Invalid input(s): PROCALCITONIN,  WBC,  LACTICIDVEN Microbiology Recent Results (from the past 240 hour(s))  Culture, blood (routine x 2)     Status: None (Preliminary result)   Collection Time: 12/18/19  7:16 AM   Specimen: BLOOD  Result Value Ref Range Status   Specimen Description BLOOD LEFT HAND  Final   Special Requests   Final  BOTTLES DRAWN AEROBIC AND ANAEROBIC Blood Culture adequate volume   Culture   Final    NO GROWTH 3 DAYS Performed at [facility], [address] [place], [place] [address]    Report Status PENDING  Incomplete  Culture, blood (routine x 2)     Status: None (Preliminary result)   Collection Time: 12/18/19  7:16 AM   Specimen: BLOOD  Result Value Ref Range Status   Specimen Description BLOOD BLOOD RIGHT WRIST  Final   Special Requests   Final    BOTTLES DRAWN AEROBIC AND ANAEROBIC Blood Culture adequate volume   Culture   Final    NO GROWTH 3 DAYS Performed at [facility] [facility], [address] [place], [place] [address]    Report Status PENDING  Incomplete  Respiratory Panel by RT PCR (Flu A&B, Covid) - Nasopharyngeal Swab     Status: None   Collection Time: 12/18/19  7:17 AM   Specimen: Nasopharyngeal Swab  Result Value Ref Range Status   SARS Coronavirus 2 by RT PCR NEGATIVE NEGATIVE Final    Comment: (NOTE) SARS-CoV-2 target nucleic acids are NOT DETECTED. The SARS-CoV-2 RNA is generally detectable in upper respiratoy specimens during the acute phase of infection. The lowest concentration of SARS-CoV-2 viral copies this assay can detect is 131  copies/mL. A negative result does not preclude SARS-Cov-2 infection and should not be used as the sole basis for treatment or other patient management decisions. A negative result may occur with  improper specimen collection/handling, submission of specimen other than nasopharyngeal swab, presence of viral mutation(s) within the areas targeted by this assay, and inadequate number of viral copies (<131 copies/mL). A negative result must be combined with clinical observations, patient history, and epidemiological information. The expected result is Negative. Fact Sheet for Patients:  PinkCheek.be Fact Sheet for Healthcare Providers:  GravelBags.it This test is not yet ap proved or cleared by the [place] FDA and  has been authorized for detection and/or diagnosis of SARS-CoV-2 by FDA under an Emergency Use Authorization (EUA). This EUA will remain  in effect (meaning this test can be used) for the duration of the COVID-19 declaration under Section 564(b)(1) of the Act, 21 U.S.C. section 360bbb-3(b)(1), unless the authorization is terminated or revoked sooner.    Influenza A by PCR NEGATIVE NEGATIVE Final   Influenza B by PCR NEGATIVE NEGATIVE Final    Comment: (NOTE) The Xpert Xpress SARS-CoV-2/FLU/RSV assay is intended as an aid in  the diagnosis of influenza from Nasopharyngeal swab specimens and  should not be used as a sole basis for treatment. Nasal washings and  aspirates are unacceptable for Xpert Xpress SARS-CoV-2/FLU/RSV  testing. Fact Sheet for Patients: PinkCheek.be Fact Sheet for Healthcare Providers: GravelBags.it This test is not yet approved or cleared by the [place] FDA and  has been authorized for detection and/or diagnosis of SARS-CoV-2 by  FDA under an Emergency Use Authorization (EUA). This EUA will remain  in effect (meaning this test can  be used) for the duration of the  Covid-19 declaration under Section 564(b)(1) of the Act, 21  U.S.C. section 360bbb-3(b)(1), unless the authorization is  terminated or revoked. Performed at [facility], [address] [place],  [address]   MRSA PCR Screening     Status: None   Collection Time: 12/19/19  7:49 AM   Specimen: Nasopharyngeal  Result Value Ref Range Status   MRSA by PCR NEGATIVE NEGATIVE Final    Comment:  The GeneXpert MRSA Assay (FDA approved for NASAL specimens only), is one component of a comprehensive MRSA colonization surveillance program. It is not intended to diagnose MRSA infection nor to guide or monitor treatment for MRSA infections. Performed at [facility], [address] [place], [place] [address]      Time coordinating discharge: Over 30 minutes  SIGNED:   Ezekiel Slocumb, DO [facility] 12/21/2019, 10:32 AM   If 7PM-7AM, please contact night-coverage [email or web address]

## 2019-12-21 NOTE — Plan of Care (Signed)
Continuing with plan of care. 

## 2019-12-21 NOTE — Plan of Care (Signed)
Discharge teaching completed with patient who verbalized understanding of discharge instructions, medications, and follow-up appointments.  Patient in stable condition and has all her belongings.

## 2019-12-21 NOTE — Progress Notes (Signed)
Pharmacy Electrolyte Monitoring Consult:  Pharmacy consulted to assist in monitoring and replacing electrolytes in this 69 y.o. female admitted on 12/18/2019. Patient with past medical history significant for hypertension, hyperlipidemia, COPD, AICD placement, CABG, Colon Cancer, CHF, and CKD.   Labs:  Sodium (mmol/L)  Date Value  12/21/2019 135  10/27/2014 141   Potassium (mmol/L)  Date Value  12/21/2019 5.4 (H)  10/27/2014 4.0   Magnesium (mg/dL)  Date Value  12/20/2019 2.1  02/25/2013 2.0   Phosphorus (mg/dL)  Date Value  12/19/2019 5.4 (H)  02/25/2013 3.8   Calcium (mg/dL)  Date Value  12/21/2019 9.0   Calcium, Total (mg/dL)  Date Value  10/27/2014 9.0   Albumin (g/dL)  Date Value  12/18/2019 3.6  08/18/2014 3.3 (L)    Assessment/Plan: Patient receiving furosemide 40mg  IV BID, and on entresto 24-26 mg BID. D/c'ed entresto until potassium normalizes. Creatinine seems to close to baseline.  No replacement warranted. Will replace oral when possible in the setting of CHF/aggressive diuresis.   Will obtain electrolytes with am labs.   Will replace for goal potassium ~ 4 and goal magnesium ~ 2.   Oswald Hillock, PharmD, BCPS 12/21/2019 8:23 AM

## 2019-12-22 ENCOUNTER — Telehealth: Payer: Self-pay | Admitting: Internal Medicine

## 2019-12-22 NOTE — Telephone Encounter (Signed)
Transition Care Management Follow-up Telephone Call  How have you been since you were released from the hospital? Patient stated she is feeling better, just tired and fatigues easily     Do you understand why you were in the hospital? Yes, Acute respiratory failure   Do you understand the discharge instrcutions? Yes, hold potassium until levels rechecked at  [facility] on 12/25/19. Patient per DC instructions needs BMP/CBC in 2 -3 days of discharge .  Items Reviewed:  Medications reviewed: yes, Hold potassium  Allergies reviewed: yes  Dietary changes reviewed: yes  Referrals reviewed: yes   Functional Questionnaire:   Activities of Daily Living (ADLs):   She states they are independent in the following: ambulation, bathing and hygiene, feeding, continence, grooming, toileting and dressing States they require assistance with the following: No assistance required.   Any transportation issues/concerns?: no   Any patient concerns? no   Confirmed importance and date/time of follow-up visits scheduled: yes   Confirmed with patient if condition begins to worsen call PCP or go to the ER.  Patient was given the Call-a-Nurse line [phone]: yes

## 2019-12-23 ENCOUNTER — Telehealth: Payer: Self-pay

## 2019-12-23 ENCOUNTER — Telehealth: Payer: Self-pay | Admitting: Family

## 2019-12-23 ENCOUNTER — Encounter: Payer: Self-pay | Admitting: *Deleted

## 2019-12-23 ENCOUNTER — Other Ambulatory Visit: Payer: Self-pay | Admitting: *Deleted

## 2019-12-23 LAB — CULTURE, BLOOD (ROUTINE X 2)
Culture: NO GROWTH
Culture: NO GROWTH
Special Requests: ADEQUATE
Special Requests: ADEQUATE

## 2019-12-23 NOTE — Patient Outreach (Signed)
[facility]) [place] Telephone Outreach PCP office completes Transition of Care outreach post-hospital discharge Post-hospital discharge day # 2  12/23/2019  Ashley [name] 1950-12-15 [id number]  Successful telephone outreach to Ashley [name], 69 y/o [place] [facility] Liaison RN CM afterhospitalization July 21-25, 2073for acute on chronic CHF exacerbation.Patient has history including, but not limited to, combined CHF; CAD with previous MI/ CABG, and ICD; HTN/ HLD; GERD; COPD;history oftobacco use-patient has quit smoking since June 19, 2019.  Patient has multiple hospital re-admissions over the last year, most within 30 days of one another; all related to acute respiratory failure due to CHF exacerbation.  Recent hospitalizations include:  January 12-13, 2021 (33 days post-previous hospital discharge),again with acute respiratory failure secondary to CHF exacerbation.Patient was discharged home to self-care without home health services in place  February 11-14, 2021 (29 days post last hospital discharge): Acute on Chronic CHF/ hypoxic respiratory failure; during hospitalization, patient had follow up ECHOcardiogram, which again indicated EF of 20-25%; patient was again discharged home to self-care without home health services in place  Today, patient reports "doing okay, but very tired" post-recent hospital discharge.  Reports that she heard from [facility] cardiothoracic surgical provider this morning to re-schedule missed appointment on 12/19/19 to evaluate/ schedule for needed surgical mitral valve revision but was unable to get to phone; she returned the call and is currently waiting to hear back.  Patient denies pain- reports chest "soreness from coughing" as noted during recent hospitalization; she denies new/ recent falls and sounds to be in no distress throughout phone call  today.  Patient further reports:  -- Has and is taking all medications as prescribed post- recent hospitalization; Patient was recently discharged from hospital and all medications were reviewed; patient is able to independently verbalize changes to medications post- hospital discharge; no concerns identified with medication review today; all discharge instructions were reviewed with patient today, who verbalizes accurate understanding of all  -- post- hospital discharge provider appointments reviewed:  Patient verbalizes accurate understanding of all and plans to attend all-- confirms that she has ongoing reliable transportation to all provider appointments; as noted above- awaiting call back from [facility] to schedule (hopeful) surgical revision of MVR   -- ongoing excellent understanding of self-health management of chronic disease state of CHF: patient able to independently verbalize home self-health management strategies without prompting: unfortunately, patient suddenly/ quickly decompensates without warning with each hospitalization, and her sudden development of symptoms cannot be managed outside of hospitalization ---- continues daily weights: post-hospital discharge weights consistently at 166 lbs- her baseline ---- continues following low salt heart healthy diet ---- continues having no peripheral swellingwith exacerbation episodes ---- coaching provided around talking points to cover with care providers at scheduled appointments, with goal of having missed [facility] appointment promptly re-scheduled so that plan to address needed surgical MVR revision can definitively be addressed   Patient denies further issues, concerns, or problems today.  I confirmed that patient has my direct phone number, the main [facility] CM office phone number, and the [facility] CM 24-hour nurse advice phone number should issues arise prior to next scheduled [place] outreach next week.  Encouraged patient to contact me  directly if needs, questions, issues, or concerns arise prior to next scheduled outreach; patient agreed to do so.  Plan:  Patient will take medications as prescribed and will attend all scheduled provider appointments  Patient will promptly notify care providers for any  new concerns/ issues/ problems that arise  Patient will continue monitoring/ recording daily weightsand following action plan for CHF zones  Patient will continuenotsmoking  [place] outreach to continue with scheduled phone callnext week, post- upcoming scheduled provider office visits  [facility] CM Care Plan Problem One     Most Recent Value  Care Plan Problem One  High risk for hospital readmission related to/ as evidenced by multiple recent hospitalizations for Acute Respiratory Failure/ CHF exacerbation with most recent hospital discharge on 12/20/2018  Role Documenting the Problem One  Care Management Coordinator  Care Plan for Problem One  Active  [facility] Long Term Goal   Over the next 31 days, patient will not experience unplanned hospital readmission, as evidenced by patient reporting and review of EMR during [facility] RN CCM outreach [goal modified/ extended today]  [facility] Long Term Goal Start Date  12/23/19 [Goal re-established today]  Interventions for Problem One Long Term Goal  Reviewed with patient post- recent hospital discharge instructions and medications,  coached patient on talking points to cover with care providers in attempt to PROMPTLY have [facility] cardiothoracic surgical provider office visit re-scheduled,  confirmed that patient continues to have reliable transportation and continues to monitor/ record daily weights at home,  confirmed that patient remains able to independently verbalize weight gain guidelines  and corresponding action plan in setting of CHF    [facility] CM Care Plan Problem Two     Most Recent Value  Care Plan Problem Two  Need for ongoing reinforcement of self-health management strategies for  chronic disease state of CHF/ COPD, as evidenced by patient reporting and recent hospitalization for CHF exacerbation  Role Documenting the Problem Two  Care Management Coordinator  Care Plan for Problem Two  Active  [facility] CM Short Term Goal #2   Over the next 30 days, patient will verbalize plan to obtain needed intervention for MVR revision as evidenced by patient reporting during [facility] RN CM outreach [Goal re-established aorund recent hospitalization]  [facility] CM Short Term Goal #2 Start Date  12/23/19  Interventions for Short Term Goal #2  Confirmed that patient has contact information for [facility] surgical provider and that she has reached out to provider to re-schedule missed appointment     [name], RN, BSN, [place] Coordinator [facility] Care Management  [phone]

## 2019-12-23 NOTE — Telephone Encounter (Signed)
LVM with patient to follow up with her since her recent hospital d/c and to confirm her follow up appt with [place] we made for 2/22.    [name], [place]

## 2019-12-23 NOTE — Telephone Encounter (Addendum)
Error

## 2019-12-24 ENCOUNTER — Ambulatory Visit: Payer: Self-pay | Admitting: *Deleted

## 2019-12-25 ENCOUNTER — Ambulatory Visit (INDEPENDENT_AMBULATORY_CARE_PROVIDER_SITE_OTHER): Payer: Medicare Other | Admitting: Internal Medicine

## 2019-12-25 ENCOUNTER — Encounter: Payer: Self-pay | Admitting: Internal Medicine

## 2019-12-25 VITALS — HR 81 | Ht 63.0 in | Wt 164.0 lb

## 2019-12-25 DIAGNOSIS — I501 Left ventricular failure: Secondary | ICD-10-CM

## 2019-12-25 DIAGNOSIS — I509 Heart failure, unspecified: Secondary | ICD-10-CM | POA: Diagnosis not present

## 2019-12-25 DIAGNOSIS — J9610 Chronic respiratory failure, unspecified whether with hypoxia or hypercapnia: Secondary | ICD-10-CM | POA: Diagnosis not present

## 2019-12-25 DIAGNOSIS — R05 Cough: Secondary | ICD-10-CM | POA: Diagnosis not present

## 2019-12-25 DIAGNOSIS — Z09 Encounter for follow-up examination after completed treatment for conditions other than malignant neoplasm: Secondary | ICD-10-CM

## 2019-12-25 DIAGNOSIS — J9621 Acute and chronic respiratory failure with hypoxia: Secondary | ICD-10-CM

## 2019-12-25 DIAGNOSIS — I5043 Acute on chronic combined systolic (congestive) and diastolic (congestive) heart failure: Secondary | ICD-10-CM

## 2019-12-25 DIAGNOSIS — R059 Cough, unspecified: Secondary | ICD-10-CM

## 2019-12-25 DIAGNOSIS — E875 Hyperkalemia: Secondary | ICD-10-CM | POA: Diagnosis not present

## 2019-12-25 NOTE — Assessment & Plan Note (Signed)
Patient is stable post discharge and has no new issues or questions about her discharge plans, but was still taking spironolactone which should have been held due to hyperkalemia noted on day of discharge.  She is advised to suspend spironolactone and Entresto until potassium is rechecked on Feb 22 .  She has follow up at [facility] on Friday Feb 26

## 2019-12-25 NOTE — Progress Notes (Signed)
Virtual Visit via [email or web address]  This visit type was conducted due to national recommendations for restrictions regarding the COVID-19 pandemic (e.g. social distancing).  This format is felt to be most appropriate for this patient at this time.  All issues noted in this document were discussed and addressed.  No physical exam was performed (except for noted visual exam findings with Video Visits).   I connected with@ on 12/25/19 at 11:00 AM EST by a video enabled telemedicine application and verified that I am speaking with the correct person using two identifiers. Location patient: home Location provider: work or home office Persons participating in the virtual visit: patient, provider  I discussed the limitations, risks, security and privacy concerns of performing an evaluation and management service by telephone and the availability of in person appointments. I also discussed with the patient that there may be a patient responsible charge related to this service. The patient expressed understanding and agreed to proceed.  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  (35, normal is < 18).  Tested negative for COVID and influenza.   Was treated with CPAP ,  Then  BIPAP initially and weaned to nasal cannula, then to room air  as pulmonary edema improved.  Was  diuresed 2.3 L  And discharged on Feb 14 with same home regimen of diuretics, but Entresto was suspended due to mild hyperkalemia (5.4) on day of discharge.still taking spironolactone.   Was Given #20 MS IR for rib pain and air hunger,  And has been limiting use to nighttime.  Sleeping well  Feels tired,  Rib pain is tolerable during the day.  Weighing self daly and  fluctuating between 164 and 166 lbs.  Salt restricted diet.  Not smoking. Productive cough, using Mucinex DM every 12 hours            ROS: See pertinent positives and negatives per HPI.  Past Medical History:  Diagnosis Date  . AICD (automatic cardioverter/defibrillator) present    on right side  . Anemia   . CAD (coronary artery disease)    s/p CABG  . CHF (congestive heart failure) ([place])   . Chronic kidney disease    renal artery stenosis  . Colon cancer ([place])   . Depression   . GERD (gastroesophageal reflux disease)   . Hx of colonic polyps   . Hyperlipidemia   . Hypertension   . Mitral valve disorder    s/p mitral valve repair wth CABG  . Myocardial infarction ([place])   . Peripheral vascular disease ([place])   . Presence of permanent cardiac pacemaker    Pacemaker/ Defibrillator  . Tobacco abuse 10/11/2014    Past Surgical History:  Procedure Laterality Date  . arm surgery     fracture, has plates and screws  . CABG with mitral valve repair    . CARDIAC CATHETERIZATION    . COLON SURGERY     colon cancer  . COLONOSCOPY WITH PROPOFOL N/A 10/09/2016   Procedure: COLONOSCOPY WITH PROPOFOL;  Surgeon: Jonathon Bellows, MD;  Location: [facility] ENDOSCOPY;  Service: Endoscopy;  Laterality: N/A;  . COLONOSCOPY WITH PROPOFOL N/A 07/24/2018   Procedure: COLONOSCOPY WITH PROPOFOL;  Surgeon: Virgel Manifold, MD;  Location: [facility]  ENDOSCOPY;  Service: Endoscopy;  Laterality: N/A;  . CORONARY ARTERY BYPASS GRAFT    . ENTEROSCOPY N/A 11/21/2018   Procedure: ENTEROSCOPY-BALLOON;  Surgeon: Jonathon Bellows, MD;  Location: [facility] ENDOSCOPY;  Service: Gastroenterology;  Laterality: N/A;  . ESOPHAGOGASTRODUODENOSCOPY (EGD) WITH PROPOFOL N/A 07/24/2018   Procedure: ESOPHAGOGASTRODUODENOSCOPY (EGD) WITH PROPOFOL;  Surgeon: Virgel Manifold, MD;  Location: [facility] ENDOSCOPY;  Service: Endoscopy;  Laterality: N/A;  . GIVENS CAPSULE STUDY N/A 08/16/2018   Procedure: GIVENS CAPSULE STUDY;  Surgeon:  Virgel Manifold, MD;  Location: [facility] ENDOSCOPY;  Service: Endoscopy;  Laterality: N/A;  . pace maker defib  2016  . PERIPHERAL VASCULAR CATHETERIZATION N/A 10/23/2016   Procedure: Renal Angiography;  Surgeon: Algernon Huxley, MD;  Location: [facility] CV LAB;  Service: Cardiovascular;  Laterality: N/A;  . RIGHT/LEFT HEART CATH AND CORONARY ANGIOGRAPHY N/A 08/26/2019   Procedure: RIGHT/LEFT HEART CATH AND CORONARY ANGIOGRAPHY;  Surgeon: Isaias Cowman, MD;  Location: [facility];  Service: Cardiovascular;  Laterality: N/A;  . TOTAL ABDOMINAL HYSTERECTOMY  1999   history of abnormal pap    Family History  Problem Relation Age of Onset  . Arthritis Mother   . Cancer Mother        uterus cancer  . Hyperlipidemia Mother   . Hypertension Mother   . Heart disease Mother   . Diabetes Mother   . Hyperlipidemia Father   . Hypertension Father   . Heart disease Father   . Diabetes Father   . Cancer Sister        ovary cancer  . Diabetes Maternal Grandmother   . Hypertension Maternal Grandmother   . Arthritis Maternal Grandmother   . Hypertension Maternal Grandfather   . Hypertension Paternal Grandmother   . Hypertension Paternal Grandfather   . Heart disease Paternal Grandfather   . Heart disease Brother   . Kidney disease Brother   . Breast cancer Neg Hx     SOCIAL HX:  reports that she quit smoking about 6 months ago. Her smoking use included cigarettes. She smoked 0.25 packs per day. She has never used smokeless tobacco. She reports current alcohol use of about 2.0 standard drinks of alcohol per week. She reports that she does not use drugs.   Current Outpatient Medications:  .  albuterol (VENTOLIN HFA) 108 (90 Base) MCG/ACT inhaler, INHALE 2 PUFFS BY MOUTH INTO THE LUNGS EVERY 3 HOURS AS NEEDED FOR WHEEZING, Disp: 54 g, Rfl: 2 .  ALPRAZolam (XANAX) 0.5 MG tablet, Take 0.5-1 tablets (0.25-0.5 mg total) by mouth at bedtime. TAKE ONE TABLET BY MOUTH AT BEDTIME AS  NEEDED FOR ANXIETY (Patient taking differently: Take 0.25-0.5 mg by mouth at bedtime. ), Disp: 30 tablet, Rfl: 3 .  aspirin 81 MG tablet, Take 81 mg by mouth daily., Disp: , Rfl:  .  atorvastatin (LIPITOR) 40 MG tablet, TAKE ONE TABLET BY MOUTH ONCE DAILY (Patient taking differently: Take 40 mg by mouth daily at 6 (six) AM. ), Disp: 90 tablet, Rfl: 1 .  carvedilol (COREG) 6.25 MG tablet, Take 1 tablet (6.25 mg total) by mouth 2 (two) times daily with a meal., Disp: 180 tablet, Rfl: 3 .  clopidogrel (PLAVIX) 75 MG tablet, Take 1 tablet (75 mg total) by mouth daily., Disp: 90 tablet, Rfl: 3 .  cyanocobalamin (,VITAMIN B-12,) 1000 MCG/ML injection, Inject 1 mL into the muscle monthly (Patient taking differently: Inject 1,000 mcg into the muscle every 30 (thirty) days. ), Disp: 10 mL, Rfl: 0 .  dextromethorphan-guaiFENesin (MUCINEX DM) 30-600 MG 12hr tablet, Take 1 tablet by mouth 2 (two) times daily as needed for up to 7 days for cough., Disp: 14 tablet, Rfl: 0 .  diphenhydrAMINE (DIPHENHIST) 25 mg capsule, Take 25 mg by mouth at bedtime as needed for allergies or sleep. , Disp: , Rfl:  .  escitalopram (LEXAPRO) 10 MG tablet, Take 1 tablet (10 mg total) by mouth daily., Disp: 90 tablet, Rfl: 1 .  febuxostat (ULORIC) 40 MG tablet, Take 1 tablet (40 mg total) by mouth daily., Disp: 30 tablet, Rfl: 5 .  ferrous sulfate 325 (65 FE) MG EC tablet, Take 325 mg by mouth daily with breakfast., Disp: , Rfl:  .  Fluticasone-Umeclidin-Vilant (TRELEGY ELLIPTA) 100-62.5-25 MCG/INH AEPB, Inhale 1 puff into the lungs daily., Disp: 180 each, Rfl: 2 .  furosemide (LASIX) 40 MG tablet, Take 1 tablet (40 mg total) by mouth 2 (two) times daily., Disp: 180 tablet, Rfl: 1 .  ipratropium-albuterol (DUONEB) 0.5-2.5 (3) MG/3ML SOLN, Take 3 mLs by nebulization every 6 (six) hours as needed., Disp: 360 mL, Rfl: 1 .  morphine (MSIR) 15 MG tablet, Take 1 tablet (15 mg total) by mouth every 6 (six) hours as needed for up to 5 days (rib  cage pain or "air hunger")., Disp: 20 tablet, Rfl: 0 .  nitroGLYCERIN (NITROSTAT) 0.4 MG SL tablet, Place 1 tablet (0.4 mg total) under the tongue every 5 (five) minutes as needed for chest pain., Disp: 30 tablet, Rfl: 3 .  omeprazole (PRILOSEC) 40 MG capsule, Take 1 capsule (40 mg total) by mouth daily., Disp: 90 capsule, Rfl: 1 .  sacubitril-valsartan (ENTRESTO) 24-26 MG, Take 1 tablet by mouth 2 (two) times daily., Disp: 180 tablet, Rfl: 3 .  spironolactone (ALDACTONE) 25 MG tablet, Take 0.5 tablets (12.5 mg total) by mouth daily., Disp: 90 tablet, Rfl: 3 .  traZODone (DESYREL) 50 MG tablet, Take 0.5-1 tablets (25-50 mg total) by mouth daily. One hour before bedtime (Patient taking differently: Take 50 mg by mouth daily. One hour before bedtime), Disp: 90 tablet, Rfl: 3  EXAM:  VITALS per patient if applicable:  GENERAL: alert, oriented, appears well and in no acute distress  HEENT: atraumatic, conjunttiva clear, no obvious abnormalities on inspection of external nose and ears  NECK: normal movements of the head and neck  LUNGS: on inspection no signs of respiratory distress, breathing rate appears normal, no obvious gross SOB, gasping or wheezing  CV: no obvious cyanosis  MS: moves all visible extremities without noticeable abnormality  PSYCH/NEURO: pleasant and cooperative, no obvious depression or anxiety, speech and thought processing grossly intact  ASSESSMENT AND PLAN:  Discussed the following assessment and plan:  Pulmonary edema cardiac cause ([place]) - Plan: DG Chest 2 View  Hyperkalemia - Plan: Basic metabolic panel  Cough in adult  Hospital discharge follow-up  Acute on chronic respiratory failure with hypoxia (HCC)  Acute on chronic combined systolic and diastolic congestive heart failure (HCC)  Cough in adult Continue mucinex DM. Continue tobacco avoidance   Hospital discharge follow-up Patient is stable post discharge and has no new issues or questions about  her discharge plans, but was still taking spironolactone which should have been held due to hyperkalemia noted on day of discharge.  She is advised to suspend spironolactone and Entresto until potassium is rechecked on Feb 22 .  She has follow up at [facility] on Friday Feb 26   Acute on chronic respiratory failure with hypoxia ([place]) Secondary to pulmonary  edema.  Resolved,  Weaned off oxygen prior to discharge, home sats  Are now  95% or higher . Advised to find her incentive spirometry rom previous hospitalization and use frequently while sitting   Hyperkalemia Entresto held at d/c; advised today to suspend spironolactone as well.  Recheck on Monday feb 22   CHF (congestive heart failure) (HCC) Advised Continue 40 mg furosemide bid, restrict sodium to < 2 grams  and double dose for weight gain (sustained )  of 2 lbs overnight     I discussed the assessment and treatment plan with the patient. The patient was provided an opportunity to ask questions and all were answered. The patient agreed with the plan and demonstrated an understanding of the instructions.   The patient was advised to call back or seek an in-person evaluation if the symptoms worsen or if the condition fails to improve as anticipated.  I provided  30 minutes of non-face-to-face time during this encounter reviewing patient's current problems and post surgeries.  Providing counseling on the above mentioned problems , and coordination  of care .  [name], MD

## 2019-12-25 NOTE — Assessment & Plan Note (Signed)
Entresto held at d/c; advised today to suspend spironolactone as well.  Recheck on Monday feb 22

## 2019-12-25 NOTE — Assessment & Plan Note (Signed)
Continue mucinex DM. Continue tobacco avoidance

## 2019-12-25 NOTE — Assessment & Plan Note (Signed)
Advised Continue 40 mg furosemide bid, restrict sodium to < 2 grams  and double dose for weight gain (sustained )  of 2 lbs overnight

## 2019-12-25 NOTE — Assessment & Plan Note (Signed)
Secondary to pulmonary edema.  Resolved,  Weaned off oxygen prior to discharge, home sats  Are now  95% or higher . Advised to find her incentive spirometry rom previous hospitalization and use frequently while sitting

## 2019-12-29 ENCOUNTER — Other Ambulatory Visit (INDEPENDENT_AMBULATORY_CARE_PROVIDER_SITE_OTHER): Payer: Medicare Other

## 2019-12-29 ENCOUNTER — Ambulatory Visit: Payer: Medicare Other | Admitting: Family

## 2019-12-29 ENCOUNTER — Ambulatory Visit (INDEPENDENT_AMBULATORY_CARE_PROVIDER_SITE_OTHER): Payer: Medicare Other

## 2019-12-29 ENCOUNTER — Other Ambulatory Visit: Payer: Self-pay

## 2019-12-29 DIAGNOSIS — E875 Hyperkalemia: Secondary | ICD-10-CM

## 2019-12-29 DIAGNOSIS — I501 Left ventricular failure: Secondary | ICD-10-CM | POA: Diagnosis not present

## 2019-12-29 DIAGNOSIS — J811 Chronic pulmonary edema: Secondary | ICD-10-CM | POA: Diagnosis not present

## 2019-12-30 ENCOUNTER — Telehealth: Payer: Self-pay

## 2019-12-30 LAB — BASIC METABOLIC PANEL
BUN: 23 mg/dL (ref 6–23)
CO2: 28 mEq/L (ref 19–32)
Calcium: 9.2 mg/dL (ref 8.4–10.5)
Chloride: 99 mEq/L (ref 96–112)
Creatinine, Ser: 1.12 mg/dL (ref 0.40–1.20)
GFR: 48.57 mL/min — ABNORMAL LOW (ref >60.00)
Glucose, Bld: 122 mg/dL — ABNORMAL HIGH (ref 70–99)
Potassium: 3.2 mEq/L — ABNORMAL LOW (ref 3.5–5.1)
Sodium: 138 mEq/L (ref 135–145)

## 2019-12-30 NOTE — Telephone Encounter (Signed)
PA for Uloric has been submitted on covermymeds.

## 2019-12-31 ENCOUNTER — Ambulatory Visit: Payer: Self-pay | Admitting: *Deleted

## 2019-12-31 ENCOUNTER — Inpatient Hospital Stay
Admission: EM | Admit: 2019-12-31 | Discharge: 2020-01-01 | DRG: 291 | Disposition: A | Discharge status: Home / Self Care | Payer: Medicare Other | Attending: Internal Medicine | Admitting: Internal Medicine

## 2019-12-31 ENCOUNTER — Inpatient Hospital Stay
Admit: 2019-12-31 | Discharge: 2019-12-31 | Disposition: A | Discharge status: Home / Self Care | Payer: Medicare Other | Attending: Internal Medicine | Admitting: Internal Medicine

## 2019-12-31 ENCOUNTER — Other Ambulatory Visit: Payer: Self-pay

## 2019-12-31 ENCOUNTER — Emergency Department: Payer: Medicare Other

## 2019-12-31 ENCOUNTER — Encounter: Payer: Self-pay | Admitting: Emergency Medicine

## 2019-12-31 DIAGNOSIS — K219 Gastro-esophageal reflux disease without esophagitis: Secondary | ICD-10-CM | POA: Diagnosis present

## 2019-12-31 DIAGNOSIS — Z7902 Long term (current) use of antithrombotics/antiplatelets: Secondary | ICD-10-CM | POA: Diagnosis not present

## 2019-12-31 DIAGNOSIS — J9621 Acute and chronic respiratory failure with hypoxia: Secondary | ICD-10-CM | POA: Diagnosis not present

## 2019-12-31 DIAGNOSIS — F325 Major depressive disorder, single episode, in full remission: Secondary | ICD-10-CM | POA: Diagnosis present

## 2019-12-31 DIAGNOSIS — R778 Other specified abnormalities of plasma proteins: Secondary | ICD-10-CM | POA: Diagnosis not present

## 2019-12-31 DIAGNOSIS — Z85038 Personal history of other malignant neoplasm of large intestine: Secondary | ICD-10-CM | POA: Diagnosis not present

## 2019-12-31 DIAGNOSIS — R7303 Prediabetes: Secondary | ICD-10-CM | POA: Diagnosis present

## 2019-12-31 DIAGNOSIS — N183 Chronic kidney disease, stage 3 unspecified: Secondary | ICD-10-CM | POA: Diagnosis present

## 2019-12-31 DIAGNOSIS — Z743 Need for continuous supervision: Secondary | ICD-10-CM | POA: Diagnosis not present

## 2019-12-31 DIAGNOSIS — D72829 Elevated white blood cell count, unspecified: Secondary | ICD-10-CM | POA: Diagnosis present

## 2019-12-31 DIAGNOSIS — R7989 Other specified abnormal findings of blood chemistry: Secondary | ICD-10-CM | POA: Diagnosis present

## 2019-12-31 DIAGNOSIS — I130 Hypertensive heart and chronic kidney disease with heart failure and stage 1 through stage 4 chronic kidney disease, or unspecified chronic kidney disease: Secondary | ICD-10-CM | POA: Diagnosis not present

## 2019-12-31 DIAGNOSIS — R0689 Other abnormalities of breathing: Secondary | ICD-10-CM | POA: Diagnosis not present

## 2019-12-31 DIAGNOSIS — F419 Anxiety disorder, unspecified: Secondary | ICD-10-CM | POA: Diagnosis present

## 2019-12-31 DIAGNOSIS — E876 Hypokalemia: Secondary | ICD-10-CM | POA: Diagnosis not present

## 2019-12-31 DIAGNOSIS — E7800 Pure hypercholesterolemia, unspecified: Secondary | ICD-10-CM | POA: Diagnosis not present

## 2019-12-31 DIAGNOSIS — I252 Old myocardial infarction: Secondary | ICD-10-CM

## 2019-12-31 DIAGNOSIS — T68XXXA Hypothermia, initial encounter: Secondary | ICD-10-CM | POA: Diagnosis not present

## 2019-12-31 DIAGNOSIS — Z79899 Other long term (current) drug therapy: Secondary | ICD-10-CM | POA: Diagnosis not present

## 2019-12-31 DIAGNOSIS — Z87891 Personal history of nicotine dependence: Secondary | ICD-10-CM | POA: Diagnosis not present

## 2019-12-31 DIAGNOSIS — M109 Gout, unspecified: Secondary | ICD-10-CM | POA: Diagnosis present

## 2019-12-31 DIAGNOSIS — I5023 Acute on chronic systolic (congestive) heart failure: Secondary | ICD-10-CM | POA: Diagnosis not present

## 2019-12-31 DIAGNOSIS — Z7982 Long term (current) use of aspirin: Secondary | ICD-10-CM

## 2019-12-31 DIAGNOSIS — R069 Unspecified abnormalities of breathing: Secondary | ICD-10-CM | POA: Diagnosis not present

## 2019-12-31 DIAGNOSIS — R000 Tachycardia, unspecified: Secondary | ICD-10-CM | POA: Diagnosis not present

## 2019-12-31 DIAGNOSIS — I5031 Acute diastolic (congestive) heart failure: Secondary | ICD-10-CM | POA: Diagnosis not present

## 2019-12-31 DIAGNOSIS — Z95810 Presence of automatic (implantable) cardiac defibrillator: Secondary | ICD-10-CM

## 2019-12-31 DIAGNOSIS — Z20822 Contact with and (suspected) exposure to covid-19: Secondary | ICD-10-CM | POA: Diagnosis not present

## 2019-12-31 DIAGNOSIS — I5043 Acute on chronic combined systolic (congestive) and diastolic (congestive) heart failure: Secondary | ICD-10-CM | POA: Diagnosis not present

## 2019-12-31 DIAGNOSIS — I110 Hypertensive heart disease with heart failure: Secondary | ICD-10-CM | POA: Diagnosis not present

## 2019-12-31 DIAGNOSIS — Z951 Presence of aortocoronary bypass graft: Secondary | ICD-10-CM

## 2019-12-31 DIAGNOSIS — N1831 Chronic kidney disease, stage 3a: Secondary | ICD-10-CM | POA: Diagnosis present

## 2019-12-31 DIAGNOSIS — I739 Peripheral vascular disease, unspecified: Secondary | ICD-10-CM | POA: Diagnosis present

## 2019-12-31 DIAGNOSIS — E785 Hyperlipidemia, unspecified: Secondary | ICD-10-CM | POA: Diagnosis not present

## 2019-12-31 DIAGNOSIS — Z833 Family history of diabetes mellitus: Secondary | ICD-10-CM | POA: Diagnosis not present

## 2019-12-31 DIAGNOSIS — I10 Essential (primary) hypertension: Secondary | ICD-10-CM | POA: Diagnosis present

## 2019-12-31 DIAGNOSIS — R0602 Shortness of breath: Secondary | ICD-10-CM | POA: Diagnosis not present

## 2019-12-31 DIAGNOSIS — I2510 Atherosclerotic heart disease of native coronary artery without angina pectoris: Secondary | ICD-10-CM | POA: Diagnosis present

## 2019-12-31 DIAGNOSIS — J441 Chronic obstructive pulmonary disease with (acute) exacerbation: Secondary | ICD-10-CM | POA: Diagnosis not present

## 2019-12-31 DIAGNOSIS — L8990 Pressure ulcer of unspecified site, unspecified stage: Secondary | ICD-10-CM | POA: Insufficient documentation

## 2019-12-31 DIAGNOSIS — R680 Hypothermia, not associated with low environmental temperature: Secondary | ICD-10-CM | POA: Diagnosis present

## 2019-12-31 DIAGNOSIS — T380X5A Adverse effect of glucocorticoids and synthetic analogues, initial encounter: Secondary | ICD-10-CM | POA: Diagnosis present

## 2019-12-31 LAB — COMPREHENSIVE METABOLIC PANEL
ALT: 18 U/L (ref 0–44)
AST: 21 U/L (ref 15–41)
Albumin: 3.8 g/dL (ref 3.5–5.0)
Alkaline Phosphatase: 64 U/L (ref 38–126)
Anion gap: 16 — ABNORMAL HIGH (ref 5–15)
BUN: 18 mg/dL (ref 8–23)
CO2: 21 mmol/L — ABNORMAL LOW (ref 22–32)
Calcium: 8.7 mg/dL — ABNORMAL LOW (ref 8.9–10.3)
Chloride: 100 mmol/L (ref 98–111)
Creatinine, Ser: 1.14 mg/dL — ABNORMAL HIGH (ref 0.44–1.00)
GFR calc Af Amer: 58 mL/min — ABNORMAL LOW (ref >60)
GFR calc non Af Amer: 50 mL/min — ABNORMAL LOW (ref >60)
Glucose, Bld: 322 mg/dL — ABNORMAL HIGH (ref 70–99)
Potassium: 3.3 mmol/L — ABNORMAL LOW (ref 3.5–5.1)
Sodium: 137 mmol/L (ref 135–145)
Total Bilirubin: 1.9 mg/dL — ABNORMAL HIGH (ref 0.3–1.2)
Total Protein: 7 g/dL (ref 6.5–8.1)

## 2019-12-31 LAB — CBC WITH DIFFERENTIAL/PLATELET
Abs Immature Granulocytes: 0.18 10*3/uL — ABNORMAL HIGH (ref 0.00–0.07)
Basophils Absolute: 0.1 10*3/uL (ref 0.0–0.1)
Basophils Relative: 1 %
Eosinophils Absolute: 0.1 10*3/uL (ref 0.0–0.5)
Eosinophils Relative: 1 %
HCT: 41.3 % (ref 36.0–46.0)
Hemoglobin: 14 g/dL (ref 12.0–15.0)
Immature Granulocytes: 1 %
Lymphocytes Relative: 7 %
Lymphs Abs: 1.4 10*3/uL (ref 0.7–4.0)
MCH: 32.6 pg (ref 26.0–34.0)
MCHC: 33.9 g/dL (ref 30.0–36.0)
MCV: 96 fL (ref 80.0–100.0)
Monocytes Absolute: 1 10*3/uL (ref 0.1–1.0)
Monocytes Relative: 6 %
Neutro Abs: 16.1 10*3/uL — ABNORMAL HIGH (ref 1.7–7.7)
Neutrophils Relative %: 84 %
Platelets: 225 10*3/uL (ref 150–400)
RBC: 4.3 MIL/uL (ref 3.87–5.11)
RDW: 14.5 % (ref 11.5–15.5)
WBC: 18.8 10*3/uL — ABNORMAL HIGH (ref 4.0–10.5)
nRBC: 0 % (ref 0.0–0.2)

## 2019-12-31 LAB — BLOOD GAS, VENOUS
Acid-base deficit: 0.6 mmol/L (ref 0.0–2.0)
Bicarbonate: 24.9 mmol/L (ref 20.0–28.0)
O2 Saturation: 96.8 %
Patient temperature: 37
pCO2, Ven: 43 mmHg — ABNORMAL LOW (ref 44.0–60.0)
pH, Ven: 7.37 (ref 7.250–7.430)
pO2, Ven: 91 mmHg — ABNORMAL HIGH (ref 32.0–45.0)

## 2019-12-31 LAB — RESPIRATORY PANEL BY RT PCR (FLU A&B, COVID)
Influenza A by PCR: NEGATIVE
Influenza B by PCR: NEGATIVE
SARS Coronavirus 2 by RT PCR: NEGATIVE

## 2019-12-31 LAB — ECHOCARDIOGRAM COMPLETE
Height: 63 in
Weight: 2641.99 oz

## 2019-12-31 LAB — TROPONIN I (HIGH SENSITIVITY)
Troponin I (High Sensitivity): 45 ng/L — ABNORMAL HIGH (ref <18)
Troponin I (High Sensitivity): 50 ng/L — ABNORMAL HIGH (ref <18)
Troponin I (High Sensitivity): 47 ng/L — ABNORMAL HIGH (ref <18)
Troponin I (High Sensitivity): 43 ng/L — ABNORMAL HIGH (ref <18)

## 2019-12-31 LAB — MAGNESIUM: Magnesium: 1.7 mg/dL (ref 1.7–2.4)

## 2019-12-31 LAB — GLUCOSE, CAPILLARY
Glucose-Capillary: 187 mg/dL — ABNORMAL HIGH (ref 70–99)
Glucose-Capillary: 294 mg/dL — ABNORMAL HIGH (ref 70–99)
Glucose-Capillary: 249 mg/dL — ABNORMAL HIGH (ref 70–99)

## 2019-12-31 LAB — BRAIN NATRIURETIC PEPTIDE: B Natriuretic Peptide: 3634 pg/mL — ABNORMAL HIGH (ref 0.0–100.0)

## 2019-12-31 MED ORDER — METHYLPREDNISOLONE SODIUM SUCC 40 MG IJ SOLR: 40.0000 mg | Freq: Two times a day (BID) | INTRAMUSCULAR | Status: DC

## 2019-12-31 MED ORDER — DIPHENHYDRAMINE HCL 25 MG PO CAPS
25.0000 mg | ORAL_CAPSULE | Freq: Every evening | ORAL | Status: DC | PRN
  Filled 2019-12-31: qty 1

## 2019-12-31 MED ORDER — MORPHINE SULFATE (PF) 4 MG/ML IV SOLN
4.0000 mg | Freq: Once | INTRAVENOUS | Status: AC
  Administered 2019-12-31: 4 mg via INTRAVENOUS
  Filled 2019-12-31: qty 1

## 2019-12-31 MED ORDER — TRAZODONE HCL 50 MG PO TABS
50.0000 mg | ORAL_TABLET | Freq: Every day | ORAL | Status: DC
  Administered 2019-12-31: 50 mg via ORAL
  Filled 2019-12-31: qty 1

## 2019-12-31 MED ORDER — FLUTICASONE FUROATE-VILANTEROL 100-25 MCG/INH IN AEPB
1.0000 | INHALATION_SPRAY | Freq: Every day | RESPIRATORY_TRACT | Status: DC
  Filled 2019-12-31: qty 28

## 2019-12-31 MED ORDER — SODIUM CHLORIDE 0.9% FLUSH: 3.0000 mL | INTRAVENOUS | Status: DC | PRN

## 2019-12-31 MED ORDER — PANTOPRAZOLE SODIUM 40 MG PO TBEC
40.0000 mg | DELAYED_RELEASE_TABLET | Freq: Every day | ORAL | Status: DC
  Administered 2019-12-31 – 2020-01-01 (×2): 40 mg via ORAL
  Filled 2019-12-31 (×2): qty 1

## 2019-12-31 MED ORDER — ALPRAZOLAM 0.25 MG PO TABS
0.2500 mg | ORAL_TABLET | Freq: Every day | ORAL | Status: DC
  Administered 2019-12-31: 0.25 mg via ORAL
  Filled 2019-12-31: qty 1

## 2019-12-31 MED ORDER — SODIUM CHLORIDE 0.9% FLUSH
3.0000 mL | Freq: Two times a day (BID) | INTRAVENOUS | Status: DC
  Administered 2019-12-31 – 2020-01-01 (×3): 3 mL via INTRAVENOUS

## 2019-12-31 MED ORDER — FUROSEMIDE 10 MG/ML IJ SOLN
40.0000 mg | Freq: Two times a day (BID) | INTRAMUSCULAR | Status: DC
  Administered 2019-12-31 – 2020-01-01 (×2): 40 mg via INTRAVENOUS
  Filled 2019-12-31 (×2): qty 4

## 2019-12-31 MED ORDER — FLUTICASONE-UMECLIDIN-VILANT 100-62.5-25 MCG/INH IN AEPB: 1.0000 | INHALATION_SPRAY | Freq: Every day | RESPIRATORY_TRACT | Status: DC

## 2019-12-31 MED ORDER — ATORVASTATIN CALCIUM 20 MG PO TABS
40.0000 mg | ORAL_TABLET | Freq: Every day | ORAL | Status: DC
  Administered 2019-12-31 – 2020-01-01 (×2): 40 mg via ORAL
  Filled 2019-12-31 (×2): qty 2

## 2019-12-31 MED ORDER — SODIUM CHLORIDE 0.9 % IV SOLN
250.0000 mL | INTRAVENOUS | Status: DC | PRN
  Administered 2019-12-31: 250 mL via INTRAVENOUS

## 2019-12-31 MED ORDER — AZITHROMYCIN 500 MG PO TABS
500.0000 mg | ORAL_TABLET | Freq: Every day | ORAL | Status: AC
  Administered 2019-12-31: 500 mg via ORAL
  Filled 2019-12-31: qty 1

## 2019-12-31 MED ORDER — AZITHROMYCIN 250 MG PO TABS
250.0000 mg | ORAL_TABLET | Freq: Every day | ORAL | Status: DC
  Administered 2020-01-01: 250 mg via ORAL
  Filled 2019-12-31: qty 1

## 2019-12-31 MED ORDER — INSULIN ASPART 100 UNIT/ML ~~LOC~~ SOLN
0.0000 [IU] | Freq: Every day | SUBCUTANEOUS | Status: DC
  Administered 2019-12-31: 2 [IU] via SUBCUTANEOUS
  Filled 2019-12-31: qty 1

## 2019-12-31 MED ORDER — ONDANSETRON HCL 4 MG/2ML IJ SOLN
4.0000 mg | Freq: Once | INTRAMUSCULAR | Status: AC
  Administered 2019-12-31: 4 mg via INTRAVENOUS
  Filled 2019-12-31: qty 2

## 2019-12-31 MED ORDER — ORAL CARE MOUTH RINSE
15.0000 mL | Freq: Two times a day (BID) | OROMUCOSAL | Status: DC
  Administered 2019-12-31 – 2020-01-01 (×3): 15 mL via OROMUCOSAL

## 2019-12-31 MED ORDER — FERROUS SULFATE 325 (65 FE) MG PO TABS
325.0000 mg | ORAL_TABLET | Freq: Every day | ORAL | Status: DC
  Administered 2020-01-01: 325 mg via ORAL
  Filled 2019-12-31: qty 1

## 2019-12-31 MED ORDER — IPRATROPIUM BROMIDE HFA 17 MCG/ACT IN AERS: 2.0000 | INHALATION_SPRAY | RESPIRATORY_TRACT | Status: DC

## 2019-12-31 MED ORDER — IPRATROPIUM BROMIDE 0.02 % IN SOLN
0.5000 mg | RESPIRATORY_TRACT | Status: DC
  Administered 2019-12-31: 0.5 mg via RESPIRATORY_TRACT
  Filled 2019-12-31: qty 2.5

## 2019-12-31 MED ORDER — UMECLIDINIUM-VILANTEROL 62.5-25 MCG/INH IN AEPB
1.0000 | INHALATION_SPRAY | Freq: Every day | RESPIRATORY_TRACT | Status: DC
  Administered 2019-12-31 – 2020-01-01 (×2): 1 via RESPIRATORY_TRACT
  Filled 2019-12-31: qty 14

## 2019-12-31 MED ORDER — FLUTICASONE PROPIONATE HFA 110 MCG/ACT IN AERO
1.0000 | INHALATION_SPRAY | Freq: Every day | RESPIRATORY_TRACT | Status: DC
  Administered 2019-12-31 – 2020-01-01 (×2): 1 via RESPIRATORY_TRACT
  Filled 2019-12-31: qty 12

## 2019-12-31 MED ORDER — METHYLPREDNISOLONE SODIUM SUCC 40 MG IJ SOLR
40.0000 mg | Freq: Two times a day (BID) | INTRAMUSCULAR | Status: DC
  Administered 2019-12-31 – 2020-01-01 (×2): 40 mg via INTRAVENOUS
  Filled 2019-12-31 (×2): qty 1

## 2019-12-31 MED ORDER — NITROGLYCERIN 0.4 MG SL SUBL: 0.4000 mg | SUBLINGUAL_TABLET | SUBLINGUAL | Status: DC | PRN

## 2019-12-31 MED ORDER — POTASSIUM CHLORIDE CRYS ER 20 MEQ PO TBCR
40.0000 meq | EXTENDED_RELEASE_TABLET | Freq: Once | ORAL | Status: AC
  Administered 2019-12-31: 40 meq via ORAL
  Filled 2019-12-31: qty 2

## 2019-12-31 MED ORDER — CLOPIDOGREL BISULFATE 75 MG PO TABS
75.0000 mg | ORAL_TABLET | Freq: Every day | ORAL | Status: DC
  Administered 2019-12-31 – 2020-01-01 (×2): 75 mg via ORAL
  Filled 2019-12-31 (×2): qty 1

## 2019-12-31 MED ORDER — IPRATROPIUM-ALBUTEROL 0.5-2.5 (3) MG/3ML IN SOLN
3.0000 mL | Freq: Once | RESPIRATORY_TRACT | Status: AC
  Administered 2019-12-31: 3 mL via RESPIRATORY_TRACT
  Filled 2019-12-31: qty 3

## 2019-12-31 MED ORDER — ACETAMINOPHEN 325 MG PO TABS
650.0000 mg | ORAL_TABLET | Freq: Four times a day (QID) | ORAL | Status: DC | PRN
  Administered 2020-01-01: 650 mg via ORAL
  Filled 2019-12-31: qty 2

## 2019-12-31 MED ORDER — FEBUXOSTAT 40 MG PO TABS
40.0000 mg | ORAL_TABLET | Freq: Every day | ORAL | Status: DC
  Administered 2019-12-31 – 2020-01-01 (×2): 40 mg via ORAL
  Filled 2019-12-31 (×2): qty 1

## 2019-12-31 MED ORDER — CHLORHEXIDINE GLUCONATE CLOTH 2 % EX PADS
6.0000 | MEDICATED_PAD | Freq: Every day | CUTANEOUS | Status: DC
  Administered 2019-12-31: 6 via TOPICAL

## 2019-12-31 MED ORDER — UMECLIDINIUM BROMIDE 62.5 MCG/INH IN AEPB
1.0000 | INHALATION_SPRAY | Freq: Every day | RESPIRATORY_TRACT | Status: DC
  Filled 2019-12-31: qty 7

## 2019-12-31 MED ORDER — HYDRALAZINE HCL 20 MG/ML IJ SOLN: 5.0000 mg | INTRAMUSCULAR | Status: DC | PRN

## 2019-12-31 MED ORDER — DM-GUAIFENESIN ER 30-600 MG PO TB12
1.0000 | ORAL_TABLET | Freq: Two times a day (BID) | ORAL | Status: DC
  Administered 2019-12-31 – 2020-01-01 (×3): 1 via ORAL
  Filled 2019-12-31 (×3): qty 1

## 2019-12-31 MED ORDER — INSULIN ASPART 100 UNIT/ML ~~LOC~~ SOLN
0.0000 [IU] | Freq: Three times a day (TID) | SUBCUTANEOUS | Status: DC
  Administered 2019-12-31: 2 [IU] via SUBCUTANEOUS
  Administered 2019-12-31: 5 [IU] via SUBCUTANEOUS
  Administered 2020-01-01: 7 [IU] via SUBCUTANEOUS
  Administered 2020-01-01: 5 [IU] via SUBCUTANEOUS
  Filled 2019-12-31 (×4): qty 1

## 2019-12-31 MED ORDER — SACUBITRIL-VALSARTAN 24-26 MG PO TABS
1.0000 | ORAL_TABLET | Freq: Two times a day (BID) | ORAL | Status: DC
  Administered 2019-12-31 – 2020-01-01 (×3): 1 via ORAL
  Filled 2019-12-31 (×4): qty 1

## 2019-12-31 MED ORDER — IPRATROPIUM-ALBUTEROL 0.5-2.5 (3) MG/3ML IN SOLN
3.0000 mL | Freq: Three times a day (TID) | RESPIRATORY_TRACT | Status: DC
  Administered 2019-12-31 – 2020-01-01 (×2): 3 mL via RESPIRATORY_TRACT
  Filled 2019-12-31 (×2): qty 3

## 2019-12-31 MED ORDER — ESCITALOPRAM OXALATE 10 MG PO TABS
10.0000 mg | ORAL_TABLET | Freq: Every day | ORAL | Status: DC
  Administered 2019-12-31 – 2020-01-01 (×2): 10 mg via ORAL
  Filled 2019-12-31 (×2): qty 1

## 2019-12-31 MED ORDER — ASPIRIN EC 81 MG PO TBEC
81.0000 mg | DELAYED_RELEASE_TABLET | Freq: Every day | ORAL | Status: DC
  Administered 2019-12-31 – 2020-01-01 (×2): 81 mg via ORAL
  Filled 2019-12-31 (×2): qty 1

## 2019-12-31 MED ORDER — ENOXAPARIN SODIUM 40 MG/0.4ML ~~LOC~~ SOLN
40.0000 mg | SUBCUTANEOUS | Status: DC
  Administered 2019-12-31 – 2020-01-01 (×2): 40 mg via SUBCUTANEOUS
  Filled 2019-12-31 (×2): qty 0.4

## 2019-12-31 MED ORDER — CARVEDILOL 6.25 MG PO TABS
6.2500 mg | ORAL_TABLET | Freq: Two times a day (BID) | ORAL | Status: DC
  Administered 2020-01-01: 6.25 mg via ORAL
  Filled 2019-12-31 (×2): qty 1

## 2019-12-31 MED ORDER — FUROSEMIDE 10 MG/ML IJ SOLN
80.0000 mg | Freq: Once | INTRAMUSCULAR | Status: AC
  Administered 2019-12-31: 80 mg via INTRAVENOUS
  Filled 2019-12-31: qty 8

## 2019-12-31 MED ORDER — ALBUTEROL SULFATE (2.5 MG/3ML) 0.083% IN NEBU: 3.0000 mL | INHALATION_SOLUTION | RESPIRATORY_TRACT | Status: DC | PRN

## 2019-12-31 NOTE — Plan of Care (Signed)
Pt arrived on the unit. A/Ox4, Bipap. Calm, cooperative. Pain decreasing after given medications. Bed in low position, alarms are on, call bell in reach, belongings(clothes) at the bedside. Will continue to monitor

## 2019-12-31 NOTE — Telephone Encounter (Signed)
Patient has CKD.  Did you try using CKD as a reason not to use allopurinol>   Lab Results  Component Value Date   CREATININE 1.14 (H) 12/31/2019

## 2019-12-31 NOTE — Progress Notes (Signed)
*  PRELIMINARY RESULTS* Echocardiogram 2D Echocardiogram has been performed.  Sherrie Sport 12/31/2019, 1:48 PM

## 2019-12-31 NOTE — Telephone Encounter (Signed)
PA for Uloric has been denied. Insurance stated that pt must try allopurinol first.

## 2019-12-31 NOTE — ED Provider Notes (Addendum)
[facility] Emergency Department Provider Note       Time seen: ----------------------------------------- 8:41 AM on 12/31/2019 ----------------------------------------- Level V caveat: History/ROS limited by respiratory distress  I have reviewed the triage vital signs and the nursing notes.  HISTORY   Chief Complaint No chief complaint on file.    HPI Ashley [name] is a 70 y.o. female with a history of anemia, coronary disease, CHF, chronic kidney disease, depression, hyperlipidemia, hypertension, MI, peripheral vascular disease who presents to the ED for respiratory distress.  Patient has severe COPD, was brought in on CPAP for same.  Patient states she used her inhalers at home without any improvement.  She received 2 DuoNeb's in route as well as IV Solu-Medrol.  Symptoms started acutely at 4 AM.  Past Medical History:  Diagnosis Date  . AICD (automatic cardioverter/defibrillator) present    on right side  . Anemia   . CAD (coronary artery disease)    s/p CABG  . CHF (congestive heart failure) ([place])   . Chronic kidney disease    renal artery stenosis  . Colon cancer ([place])   . Depression   . GERD (gastroesophageal reflux disease)   . Hx of colonic polyps   . Hyperlipidemia   . Hypertension   . Mitral valve disorder    s/p mitral valve repair wth CABG  . Myocardial infarction ([place])   . Peripheral vascular disease ([place])   . Presence of permanent cardiac pacemaker    Pacemaker/ Defibrillator  . Tobacco abuse 10/11/2014    Patient Active Problem List   Diagnosis Date Noted  . Pulmonary edema cardiac cause ([place]) 12/25/2019  . Hyperlipidemia   . COPD exacerbation ([place])   . Hyperkalemia   . Elevated troponin   . Acute renal failure superimposed on stage 3a chronic kidney disease ([place])   . Lactic acidosis   . Gouty arthritis of both feet 11/20/2019  . Mitral stenosis with insufficiency   . Hypertensive urgency   . Chronic renal failure,  stage 3a   . Depression   . Acute on chronic respiratory failure with hypoxia ([place]) 09/22/2019  . Hyperglycemia 09/22/2019  . Presence of permanent cardiac pacemaker   . Anemia   . Chronic kidney disease   . Hypertension   . Mitral valve disorder 08/25/2019  . Acute respiratory failure ([place]) 08/24/2019  . CHF (congestive heart failure) ([place]) 07/16/2019  . Cough with hemoptysis 06/09/2019  . Insomnia 05/07/2019  . Hyperglycemia, drug-induced 03/23/2019  . COVID-19 virus not detected 03/23/2019  . Respiratory failure ([place]) 03/16/2019  . AVM (arteriovenous malformation) of small bowel, acquired   . Anemia, iron deficiency 11/10/2018  . Acute on chronic systolic CHF (congestive heart failure) ([place]) 11/06/2018  . Rectal polyp   . Benign neoplasm of cecum   . Barrett's esophagus without dysplasia   . Stomach irritation   . Chronic diastolic heart failure ([place]) 07/03/2018  . HTN (hypertension) 07/03/2018  . 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 B12 deficiency anemia 06/28/2018  . Prediabetes 05/11/2018  . Personal history of colon cancer   . Benign neoplasm of descending colon   . Polyp of sigmoid colon   . Benign neoplasm of transverse colon   . Diverticulosis of large intestine without diverticulitis   . 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 Renal artery stenosis ([place]) 10/03/2016  . 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 Hospital discharge follow-up 02/09/2015  . Cough in adult 10/21/2014  . Tobacco abuse counseling  10/11/2014  . Tobacco abuse 10/11/2014  . Chronic right hip pain 08/26/2014  . COPD with acute exacerbation ([place]) 06/18/2013  . Atherosclerosis of native artery of extremity with intermittent claudication ([place]) 06/18/2013  . Preoperative evaluation to rule out surgical contraindication 06/18/2013  . CAD (coronary artery disease) 06/01/2013  . GERD (gastroesophageal reflux disease) 06/01/2013  . Hypercholesterolemia  06/01/2013  . Tubular adenoma of colon 06/01/2013  . 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 arm surgery     fracture, has plates and screws  . CABG with mitral valve repair    . CARDIAC CATHETERIZATION    . COLON SURGERY     colon cancer  . COLONOSCOPY WITH PROPOFOL N/A 10/09/2016   Procedure: COLONOSCOPY WITH PROPOFOL;  Surgeon: Jonathon Bellows, MD;  Location: [facility] ENDOSCOPY;  Service: Endoscopy;  Laterality: N/A;  . COLONOSCOPY WITH PROPOFOL N/A 07/24/2018   Procedure: COLONOSCOPY WITH PROPOFOL;  Surgeon: Virgel Manifold, MD;  Location: [facility] ENDOSCOPY;  Service: Endoscopy;  Laterality: N/A;  . CORONARY ARTERY BYPASS GRAFT    . ENTEROSCOPY N/A 11/21/2018   Procedure: ENTEROSCOPY-BALLOON;  Surgeon: Jonathon Bellows, MD;  Location: [facility] ENDOSCOPY;  Service: Gastroenterology;  Laterality: N/A;  . ESOPHAGOGASTRODUODENOSCOPY (EGD) WITH PROPOFOL N/A 07/24/2018   Procedure: ESOPHAGOGASTRODUODENOSCOPY (EGD) WITH PROPOFOL;  Surgeon: Virgel Manifold, MD;  Location: [facility] ENDOSCOPY;  Service: Endoscopy;  Laterality: N/A;  . GIVENS CAPSULE STUDY N/A 08/16/2018   Procedure: GIVENS CAPSULE STUDY;  Surgeon: Virgel Manifold, MD;  Location: [facility] ENDOSCOPY;  Service: Endoscopy;  Laterality: N/A;  . pace maker defib  2016  . PERIPHERAL VASCULAR CATHETERIZATION N/A 10/23/2016   Procedure: Renal Angiography;  Surgeon: Algernon Huxley, MD;  Location: [facility];  Service: Cardiovascular;  Laterality: N/A;  . RIGHT/LEFT HEART CATH AND CORONARY ANGIOGRAPHY N/A 08/26/2019   Procedure: RIGHT/LEFT HEART CATH AND CORONARY ANGIOGRAPHY;  Surgeon: Isaias Cowman, MD;  Location: [facility];  Service: Cardiovascular;  Laterality: N/A;  . TOTAL ABDOMINAL HYSTERECTOMY  1999   history of abnormal pap    Allergies Vancomycin and Hydrocodone-acetaminophen  Social History Social History   Tobacco Use  . Smoking status:  Former Smoker    Packs/day: 0.25    Types: Cigarettes    Quit date: 06/19/2019    Years since quitting: 0.5  . Smokeless tobacco: Never Used  . Tobacco comment: reports smoking 2-3 cigarettes/ day  Substance Use Topics  . Alcohol use: Yes    Alcohol/week: 2.0 standard drinks    Types: 2 Shots of liquor per week    Comment: occasion  . Drug use: No   Review of Systems Constitutional: Negative for fever. Cardiovascular: Negative for chest pain. Respiratory: Positive for shortness of breath Gastrointestinal: Negative for abdominal pain, vomiting and diarrhea. Musculoskeletal: Negative for back pain. Skin: Negative for rash. Neurological: Negative for headaches, focal weakness or numbness.  All systems negative/normal/unremarkable except as stated in the HPI  ____________________________________________   PHYSICAL EXAM:  VITAL SIGNS: ED Triage Vitals  Enc Vitals Group     BP      Pulse      Resp      Temp      Temp src      SpO2      Weight      Height      Head Circumference      Peak Flow      Pain Score  Pain Loc      Pain Edu?      Excl. in [place]?    Constitutional: Alert and oriented.  Moderate distress Eyes: Conjunctivae are normal. Normal extraocular movements. ENT      Head: Normocephalic and atraumatic.      Nose: No congestion/rhinnorhea.      Mouth/Throat: Mucous membranes are moist.      Neck: No stridor. Cardiovascular: Normal rate, regular rhythm. No murmurs, rubs, or gallops. Respiratory: Tachypnea with wheezing and rhonchi bilaterally Gastrointestinal: Soft and nontender. Normal bowel sounds Musculoskeletal: Nontender with normal range of motion in extremities. No lower extremity tenderness nor edema. Neurologic:  Normal speech and language. No gross focal neurologic deficits are appreciated.  Skin: Skin is cool and mottled Psychiatric: Anxious ____________________________________________  EKG: Interpreted by me.  Tachycardia with a rate of  117 bpm, ventricular paced rhythm, normal pacemaker function.  ____________________________________________  ED COURSE:  As part of my medical decision making, I reviewed the following data within the [facility] History obtained from family if available, nursing notes, old chart and ekg, as well as notes from prior ED visits. Patient presented for respiratory distress, we will assess with labs and imaging as indicated at this time.   Procedures  Ashley [name] was evaluated in Emergency Department on 12/31/2019 for the symptoms described in the history of present illness. She was evaluated in the context of the global COVID-19 pandemic, which necessitated consideration that the patient might be at risk for infection with the SARS-CoV-2 virus that causes COVID-19. Institutional protocols and algorithms that pertain to the evaluation of patients at risk for COVID-19 are in a state of rapid change based on information released by regulatory bodies including the CDC and federal and state organizations. These policies and algorithms were followed during the patient's care in the ED.  ____________________________________________   LABS (pertinent positives/negatives)  Labs Reviewed  CBC WITH DIFFERENTIAL/PLATELET - Abnormal; Notable for the following components:      Result Value   WBC 18.8 (*)    Neutro Abs 16.1 (*)    Abs Immature Granulocytes 0.18 (*)    All other components within normal limits  BLOOD GAS, VENOUS - Abnormal; Notable for the following components:   pCO2, Ven 43 (*)    pO2, Ven 91.0 (*)    All other components within normal limits  COMPREHENSIVE METABOLIC PANEL - Abnormal; Notable for the following components:   Potassium 3.3 (*)    CO2 21 (*)    Glucose, Bld 322 (*)    Creatinine, Ser 1.14 (*)    Calcium 8.7 (*)    Total Bilirubin 1.9 (*)    GFR calc non Af Amer 50 (*)    GFR calc Af Amer 58 (*)    Anion gap 16 (*)    All other components within  normal limits  TROPONIN I (HIGH SENSITIVITY) - Abnormal; Notable for the following components:   Troponin I (High Sensitivity) 45 (*)    All other components within normal limits  RESPIRATORY PANEL BY RT PCR (FLU A&B, COVID)  BRAIN NATRIURETIC PEPTIDE   CRITICAL CARE Performed by: Laurence Aly   Total critical care time: 30 minutes  Critical care time was exclusive of separately billable procedures and treating other patients.  Critical care was necessary to treat or prevent imminent or life-threatening deterioration.  Critical care was time spent personally by me on the following activities: development of treatment plan with patient and/or surrogate as  well as nursing, discussions with consultants, evaluation of patient's response to treatment, examination of patient, obtaining history from patient or surrogate, ordering and performing treatments and interventions, ordering and review of laboratory studies, ordering and review of radiographic studies, pulse oximetry and re-evaluation of patient's condition.  RADIOLOGY Images were viewed by me  Chest x-ray IMPRESSION:  Increase in interstitial edema compared to 2 days prior. No  consolidation. Persistent cardiomegaly with pulmonary venous  hypertension consistent with pulmonary vascular congestion. Findings  appear consistent with a degree of congestive heart failure.   Postoperative changes noted. Stable appearance of pacemaker leads.  ____________________________________________   DIFFERENTIAL DIAGNOSIS   COPD, pneumonia, PE, pneumothorax, COVID-19  FINAL ASSESSMENT AND PLAN  Acute respiratory failure, CHF exacerbation, COPD exacerbation   Plan: The patient had presented for respiratory distress.  Patient was changed from CPAP to BiPAP on arrival.  We gave an additional DuoNeb as well.  Patient's labs did reveal some leukocytosis and mildly elevated troponin. Patient's imaging revealed more findings of vascular  congestion and edema suggestive of congestive heart failure.  She does also have COPD.  Patient appears to be somewhat improved on BiPAP.  We will give IV Lasix and attempt to diurese.  She appears medically clear for admission at this time.   Laurence Aly, MD    Note: This note was generated in part or whole with voice recognition software. Voice recognition is usually quite accurate but there are transcription errors that can and very often do occur. I apologize for any typographical errors that were not detected and corrected.     Earleen Newport, MD 12/31/19 [id number]    Earleen Newport, MD 12/31/19 [phone]

## 2019-12-31 NOTE — ED Triage Notes (Signed)
Patient presents to the ED via EMS from home with shortness of breath that began at 4am this morning.  Patient has been hospitalized recently for copd exacerbation.  Patient given 2 duonebs by EMS,  1/2 in nitro paste due to hypertension and 125mg  of IV solumedrol.  Patient tripoding on arrival to the ED.  Patient had been placed on cpap by ems and was placed on bipap in ED on arrival by RT.

## 2019-12-31 NOTE — H&P (Addendum)
History and Physical    Ashley [name] NWG:[id number] DOB: 1951-01-02 DOA: 12/31/2019  Referring MD/NP/PA:   PCP: [name], MD   Patient coming from:  The patient is coming from home.  At baseline, pt is independent for most of ADL.        Chief Complaint: SOB  HPI: Ashley [name] is a 69 y.o. female with medical history significant of hypertension, hyperlipidemia, prediabetes, COPD, GERD, gout, depression, former smoker, AICD placement, CAD, CABG, colon cancer, sCHF with EF of 25%, mitral valve repair, CKD-3, who presents with shortness of breath.  Patient states that her shortness breath has worsened since this morning, which has been progressively worsening.  She has dry cough, no chest pain.  No fever or chills.  Patient denies nausea, vomiting, diarrhea, abdominal pain, symptoms of UTI or unilateral weakness.  Patient has respiratory distress, requiring BiPAP in ED.   ED Course: pt was found to have BNP 3634, troponin 45, negative RVP for Covid, stable renal function, potassium 3.3, temperature 96.5, blood pressure 163/127, tachycardia, tachypnea, patient has oxygen saturation 99% on room air.  Chest x-ray showed interstitial pulmonary edema, cardiomegaly and vascular congestion.  No infiltration.  Review of Systems:   General: no fevers, chills, has poor appetite, has fatigue HEENT: no blurry vision, hearing changes or sore throat Respiratory: has dyspnea, coughing, wheezing CV: no chest pain, no palpitations GI: no nausea, vomiting, abdominal pain, diarrhea, constipation GU: no dysuria, burning on urination, increased urinary frequency, hematuria  Ext: has trace leg edema Neuro: no unilateral weakness, numbness, or tingling, no vision change or hearing loss Skin: no rash, no skin tear. MSK: No muscle spasm, no deformity, no limitation of range of movement in spin Heme: No easy bruising.  Travel history: No recent long distant travel.  Allergy:  Allergies   Allergen Reactions  . Vancomycin Nausea And Vomiting and Palpitations  . Hydrocodone-Acetaminophen Nausea Only    Past Medical History:  Diagnosis Date  . AICD (automatic cardioverter/defibrillator) present    on right side  . Anemia   . CAD (coronary artery disease)    s/p CABG  . CHF (congestive heart failure) ([place])   . Chronic kidney disease    renal artery stenosis  . Colon cancer ([place])   . Depression   . GERD (gastroesophageal reflux disease)   . Hx of colonic polyps   . Hyperlipidemia   . Hypertension   . Mitral valve disorder    s/p mitral valve repair wth CABG  . Myocardial infarction ([place])   . Peripheral vascular disease ([place])   . Presence of permanent cardiac pacemaker    Pacemaker/ Defibrillator  . Tobacco abuse 10/11/2014    Past Surgical History:  Procedure Laterality Date  . arm surgery     fracture, has plates and screws  . CABG with mitral valve repair    . CARDIAC CATHETERIZATION    . COLON SURGERY     colon cancer  . COLONOSCOPY WITH PROPOFOL N/A 10/09/2016   Procedure: COLONOSCOPY WITH PROPOFOL;  Surgeon: Jonathon Bellows, MD;  Location: [facility] ENDOSCOPY;  Service: Endoscopy;  Laterality: N/A;  . COLONOSCOPY WITH PROPOFOL N/A 07/24/2018   Procedure: COLONOSCOPY WITH PROPOFOL;  Surgeon: Virgel Manifold, MD;  Location: [facility] ENDOSCOPY;  Service: Endoscopy;  Laterality: N/A;  . CORONARY ARTERY BYPASS GRAFT    . ENTEROSCOPY N/A 11/21/2018   Procedure: ENTEROSCOPY-BALLOON;  Surgeon: Jonathon Bellows, MD;  Location: [facility] ENDOSCOPY;  Service: Gastroenterology;  Laterality: N/A;  .  ESOPHAGOGASTRODUODENOSCOPY (EGD) WITH PROPOFOL N/A 07/24/2018   Procedure: ESOPHAGOGASTRODUODENOSCOPY (EGD) WITH PROPOFOL;  Surgeon: Virgel Manifold, MD;  Location: [facility] ENDOSCOPY;  Service: Endoscopy;  Laterality: N/A;  . GIVENS CAPSULE STUDY N/A 08/16/2018   Procedure: GIVENS CAPSULE STUDY;  Surgeon: Virgel Manifold, MD;  Location: [facility] ENDOSCOPY;  Service: Endoscopy;  Laterality:  N/A;  . pace maker defib  2016  . PERIPHERAL VASCULAR CATHETERIZATION N/A 10/23/2016   Procedure: Renal Angiography;  Surgeon: Algernon Huxley, MD;  Location: [place] CV LAB;  Service: Cardiovascular;  Laterality: N/A;  . RIGHT/LEFT HEART CATH AND CORONARY ANGIOGRAPHY N/A 08/26/2019   Procedure: RIGHT/LEFT HEART CATH AND CORONARY ANGIOGRAPHY;  Surgeon: Isaias Cowman, MD;  Location: [place] CV [facility];  Service: Cardiovascular;  Laterality: N/A;  . TOTAL ABDOMINAL HYSTERECTOMY  1999   history of abnormal pap    Social History:  reports that she quit smoking about 6 months ago. Her smoking use included cigarettes. She smoked 0.25 packs per day. She has never used smokeless tobacco. She reports current alcohol use of about 2.0 standard drinks of alcohol per week. She reports that she does not use drugs.  Family History:  Family History  Problem Relation Age of Onset  . Arthritis Mother   . Cancer Mother        uterus cancer  . Hyperlipidemia Mother   . Hypertension Mother   . Heart disease Mother   . Diabetes Mother   . Hyperlipidemia Father   . Hypertension Father   . Heart disease Father   . Diabetes Father   . Cancer Sister        ovary cancer  . Diabetes Maternal Grandmother   . Hypertension Maternal Grandmother   . Arthritis Maternal Grandmother   . Hypertension Maternal Grandfather   . Hypertension Paternal Grandmother   . Hypertension Paternal Grandfather   . Heart disease Paternal Grandfather   . Heart disease Brother   . Kidney disease Brother   . Breast cancer Neg Hx      Prior to Admission medications   Medication Sig Start Date End Date Taking? Authorizing Provider  albuterol (VENTOLIN HFA) 108 (90 Base) MCG/ACT inhaler INHALE 2 PUFFS BY MOUTH INTO THE LUNGS EVERY 3 HOURS AS NEEDED FOR WHEEZING 12/21/19   Ezekiel Slocumb, DO  ALPRAZolam Duanne Moron) 0.5 MG tablet Take 0.5-1 tablets (0.25-0.5 mg total) by mouth at bedtime. TAKE ONE TABLET BY MOUTH AT BEDTIME  AS NEEDED FOR ANXIETY Patient taking differently: Take 0.25-0.5 mg by mouth at bedtime.  09/05/19   [name], MD  aspirin 81 MG tablet Take 81 mg by mouth daily.    [provider]  atorvastatin (LIPITOR) 40 MG tablet TAKE ONE TABLET BY MOUTH ONCE DAILY Patient taking differently: Take 40 mg by mouth daily at 6 (six) AM.  12/04/19   [name], MD  carvedilol (COREG) 6.25 MG tablet Take 1 tablet (6.25 mg total) by mouth 2 (two) times daily with a meal. 09/05/19   [name], MD  clopidogrel (PLAVIX) 75 MG tablet Take 1 tablet (75 mg total) by mouth daily. 09/05/19   Alisa Graff, FNP  cyanocobalamin (,VITAMIN B-12,) 1000 MCG/ML injection Inject 1 mL into the muscle monthly Patient taking differently: Inject 1,000 mcg into the muscle every 30 (thirty) days.  09/05/19   [name], MD  diphenhydrAMINE (DIPHENHIST) 25 mg capsule Take 25 mg by mouth at bedtime as needed for allergies or sleep.  [provider]  escitalopram (LEXAPRO) 10 MG tablet Take 1 tablet (10 mg total) by mouth daily. 09/05/19   [name], MD  febuxostat (ULORIC) 40 MG tablet Take 1 tablet (40 mg total) by mouth daily. 11/19/19   [name], MD  ferrous sulfate 325 (65 FE) MG EC tablet Take 325 mg by mouth daily with breakfast.    [provider]  Fluticasone-Umeclidin-Vilant (TRELEGY ELLIPTA) 100-62.5-25 MCG/INH AEPB Inhale 1 puff into the lungs daily. 09/04/19   Tyler Pita, MD  furosemide (LASIX) 40 MG tablet Take 1 tablet (40 mg total) by mouth 2 (two) times daily. 11/19/19   [name], MD  ipratropium-albuterol (DUONEB) 0.5-2.5 (3) MG/3ML SOLN Take 3 mLs by nebulization every 6 (six) hours as needed. 11/19/19   [name], MD  nitroGLYCERIN (NITROSTAT) 0.4 MG SL tablet Place 1 tablet (0.4 mg total) under the tongue every 5 (five) minutes as needed for chest pain. 09/03/19   [name], MD  omeprazole (PRILOSEC) 40 MG capsule Take 1 capsule (40  mg total) by mouth daily. 09/05/19   [name], MD  sacubitril-valsartan (ENTRESTO) 24-26 MG Take 1 tablet by mouth 2 (two) times daily. 09/05/19   Alisa Graff, FNP  spironolactone (ALDACTONE) 25 MG tablet Take 0.5 tablets (12.5 mg total) by mouth daily. 09/05/19 09/04/20  Alisa Graff, FNP  traZODone (DESYREL) 50 MG tablet Take 0.5-1 tablets (25-50 mg total) by mouth daily. One hour before bedtime Patient taking differently: Take 50 mg by mouth daily. One hour before bedtime 09/05/19   [name], MD    Physical Exam: Vitals:   12/31/19 0848 12/31/19 0930 12/31/19 1031 12/31/19 1033  BP:  (!) 126/99 (!) 169/98   Pulse:  (!) 113 (!) 111   Resp:  (!) 26 (!) 26   Temp:    98.7 F (37.1 C)  TempSrc:    Oral  SpO2:  95% 96%   Weight: 74.4 kg     Height: 5\' 3"  (1.6 m)      General: Not in acute distress HEENT:       Eyes: PERRL, EOMI, no scleral icterus.       ENT: No discharge from the ears and nose, no pharynx injection, no tonsillar enlargement.        Neck: positive JVD, no bruit, no mass felt. Heme: No neck lymph node enlargement. Cardiac: S1/S2, RRR, No murmurs, No gallops or rubs. Respiratory: Has rhonchi and crackles bilaterally  GI: Soft, nondistended, nontender, no rebound pain, no organomegaly, BS present. GU: No hematuria Ext: has trace leg edema bilaterally. 2+DP/PT pulse bilaterally. Musculoskeletal: No joint deformities, No joint redness or warmth, no limitation of ROM in spin. Skin: No rashes.  Neuro: Alert, oriented X3, cranial nerves II-XII grossly intact, moves all extremities normally.  Psych: Patient is not psychotic, no suicidal or hemocidal ideation.  Labs on Admission: I have personally reviewed following labs and imaging studies  CBC: Recent Labs  Lab 12/31/19 0900  WBC 18.8*  NEUTROABS 16.1*  HGB 14.0  HCT 41.3  MCV 96.0  PLT 355   Basic Metabolic Panel: Recent Labs  Lab 12/29/19 1450 12/31/19 0900  NA 138 137  K 3.2* 3.3*    CL 99 100  CO2 28 21*  GLUCOSE 122* 322*  BUN 23 18  CREATININE 1.12 1.14*  CALCIUM 9.2 8.7*   GFR: Estimated Creatinine Clearance: 46.9 mL/min (A) (by C-G formula based on SCr of  1.14 mg/dL (H)). Liver Function Tests: Recent Labs  Lab 12/31/19 0900  AST 21  ALT 18  ALKPHOS 64  BILITOT 1.9*  PROT 7.0  ALBUMIN 3.8   No results for input(s): LIPASE, AMYLASE in the last 168 hours. No results for input(s): AMMONIA in the last 168 hours. Coagulation Profile: No results for input(s): INR, PROTIME in the last 168 hours. Cardiac Enzymes: No results for input(s): CKTOTAL, CKMB, CKMBINDEX, TROPONINI in the last 168 hours. BNP (last 3 results) No results for input(s): PROBNP in the last 8760 hours. HbA1C: No results for input(s): HGBA1C in the last 72 hours. CBG: No results for input(s): GLUCAP in the last 168 hours. Lipid Profile: No results for input(s): CHOL, HDL, LDLCALC, TRIG, CHOLHDL, LDLDIRECT in the last 72 hours. Thyroid Function Tests: No results for input(s): TSH, T4TOTAL, FREET4, T3FREE, THYROIDAB in the last 72 hours. Anemia Panel: No results for input(s): VITAMINB12, FOLATE, FERRITIN, TIBC, IRON, RETICCTPCT in the last 72 hours. Urine analysis:    Component Value Date/Time   COLORURINE YELLOW (A) 12/18/2019 0713   APPEARANCEUR CLEAR (A) 12/18/2019 0713   APPEARANCEUR Clear 08/19/2014 0025   LABSPEC 1.016 12/18/2019 0713   LABSPEC 1.009 08/19/2014 0025   PHURINE 5.0 12/18/2019 0713   GLUCOSEU NEGATIVE 12/18/2019 0713   GLUCOSEU 50 mg/dL 08/19/2014 0025   HGBUR NEGATIVE 12/18/2019 0713   BILIRUBINUR NEGATIVE 12/18/2019 0713   BILIRUBINUR Negative 08/19/2014 0025   KETONESUR NEGATIVE 12/18/2019 0713   PROTEINUR NEGATIVE 12/18/2019 0713   NITRITE NEGATIVE 12/18/2019 0713   LEUKOCYTESUR NEGATIVE 12/18/2019 0713   LEUKOCYTESUR Negative 08/19/2014 0025   Sepsis Labs: @LABRCNTIP (procalcitonin:4,lacticidven:4) ) Recent Results (from the past 240 hour(s))   Respiratory Panel by RT PCR (Flu A&B, Covid) - Nasopharyngeal Swab     Status: None   Collection Time: 12/31/19  9:07 AM   Specimen: Nasopharyngeal Swab  Result Value Ref Range Status   SARS Coronavirus 2 by RT PCR NEGATIVE NEGATIVE Final    Comment: (NOTE) SARS-CoV-2 target nucleic acids are NOT DETECTED. The SARS-CoV-2 RNA is generally detectable in upper respiratoy specimens during the acute phase of infection. The lowest concentration of SARS-CoV-2 viral copies this assay can detect is 131 copies/mL. A negative result does not preclude SARS-Cov-2 infection and should not be used as the sole basis for treatment or other patient management decisions. A negative result may occur with  improper specimen collection/handling, submission of specimen other than nasopharyngeal swab, presence of viral mutation(s) within the areas targeted by this assay, and inadequate number of viral copies (<131 copies/mL). A negative result must be combined with clinical observations, patient history, and epidemiological information. The expected result is Negative. Fact Sheet for Patients:  PinkCheek.be Fact Sheet for Healthcare Providers:  GravelBags.it This test is not yet ap proved or cleared by the [place] FDA and  has been authorized for detection and/or diagnosis of SARS-CoV-2 by FDA under an Emergency Use Authorization (EUA). This EUA will remain  in effect (meaning this test can be used) for the duration of the COVID-19 declaration under Section 564(b)(1) of the Act, 21 U.S.C. section 360bbb-3(b)(1), unless the authorization is terminated or revoked sooner.    Influenza A by PCR NEGATIVE NEGATIVE Final   Influenza B by PCR NEGATIVE NEGATIVE Final    Comment: (NOTE) The Xpert Xpress SARS-CoV-2/FLU/RSV assay is intended as an aid in  the diagnosis of influenza from Nasopharyngeal swab specimens and  should not be used as a sole  basis for treatment. Nasal  washings and  aspirates are unacceptable for Xpert Xpress SARS-CoV-2/FLU/RSV  testing. Fact Sheet for Patients: PinkCheek.be Fact Sheet for Healthcare Providers: GravelBags.it This test is not yet approved or cleared by the [place] FDA and  has been authorized for detection and/or diagnosis of SARS-CoV-2 by  FDA under an Emergency Use Authorization (EUA). This EUA will remain  in effect (meaning this test can be used) for the duration of the  Covid-19 declaration under Section 564(b)(1) of the Act, 21  U.S.C. section 360bbb-3(b)(1), unless the authorization is  terminated or revoked. Performed at [facility], [address], [place], [place] [address]      Radiological Exams on Admission: DG Chest 2 View  Result Date: 12/30/2019 CLINICAL DATA:  Recent pulmonary edema EXAM: CHEST - 2 VIEW COMPARISON:  December 21, 2019 and December 19, 2019 FINDINGS: There is cardiomegaly with mild pulmonary venous hypertension. Patient is status post coronary artery bypass grafting. Pacemaker leads are attached to the right atrium, right ventricle, and coronary sinus. There is trace interstitial edema. No airspace opacity or pleural effusion. There is aortic atherosclerosis. No adenopathy. Postoperative changes noted in the left clavicle. IMPRESSION: Cardiomegaly with mild pulmonary vascular congestion. Trace interstitial edema. No pleural effusion or airspace consolidation. Postoperative changes as noted with pacemaker leads attached to right atrium, right ventricle, and coronary sinus. Status post coronary artery bypass grafting. Electronically Signed   By: Lowella Grip III M.D.   On: 12/30/2019 08:14   DG Chest Port 1 View  Result Date: 12/31/2019 CLINICAL DATA:  Shortness of breath EXAM: PORTABLE CHEST 1 VIEW COMPARISON:  December 29, 2019 FINDINGS: There remains interstitial edema, somewhat  increased from 2 days prior. No consolidation or pleural effusion evident. There is cardiomegaly with pulmonary venous hypertension. Pacemaker leads are attached to the right atrium, right ventricle, and coronary sinus. Patient is status post coronary artery bypass grafting. No adenopathy. There is aortic atherosclerosis. Postoperative changes in left clavicle. IMPRESSION: Increase in interstitial edema compared to 2 days prior. No consolidation. Persistent cardiomegaly with pulmonary venous hypertension consistent with pulmonary vascular congestion. Findings appear consistent with a degree of congestive heart failure. Postoperative changes noted.  Stable appearance of pacemaker leads. Electronically Signed   By: Lowella Grip III M.D.   On: 12/31/2019 09:17     EKG: Independently reviewed.  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  Patient has elevated BNP 3634, interstitial pulmonary edema on chest x-ray, positive JVD, clinically consistent with CHF exacerbation.  Patient has rhonchi on auscultation, consistent with COPD exacerbation.  -Admit to stepdown as inpatient -Continue BiPAP -Bronchodilators -IV diuretics for CHF  Acute on chronic systolic CHF: 2D echo on 11/24/4172 showed EF of 25%. -Lasix 40 mg bid by IV (patient received 80 mg IV Lasix in ED) -2d echo -continue home Entresto -Daily  weights -strict I/O's -Low salt diet -Fluid restriction  COPD exacerbation: -Bronchodilators -Solu-Medrol 40  mg IV tid -Z pak -Mucinex for cough  -Incentive spirometry -on BiPAP  CAD (coronary artery disease) and elevated trop: s/p of CBAG. Trop 45. No CP.  Likely due to demand ischemia. -Continue aspirin, Plavix, Lipitor, Coreg -As needed nitroglycerin -Trend troponin -Follow-up 2D echo  GERD (gastroesophageal reflux disease) -protonix  Hypercholesterolemia -lipitor  Major depressive disorder in remission ([place]) and anxiety: -Continue home Lexapro and Xanax  Prediabetes: Most recent A1c 6.7, well controled. Patient is not taking meds at home. Blood sugar 322 -start SSI  Hypokalemia: K=3.3 on admission. - Repleted - Check Mg level  HTN:  -Continue home medications: Entresto, Coreg -Patient is on IV Lasix -hydralazine prn  Leukocytosis: WBC 18.8.  Possibly due to steroid use. -Follow-up of blood culture -Follow-up with CBC -Patient is on Z-Pak for COPD exacerbation  Hypothermia: -Bair Hugger  CKD (chronic kidney disease), stage IIIa: stable. -f/u [facility]     Inpatient status:  # Patient requires inpatient status due to high intensity of service, high risk for further deterioration and high frequency of surveillance required.  I certify that at the point of admission it is my clinical judgment that the patient will require inpatient hospital care spanning beyond 2 midnights from the point of admission.  . This patient has multiple chronic comorbidities including hypertension, hyperlipidemia, prediabetes, COPD, GERD, gout, depression, former smoker, AICD placement, CAD, CABG, colon cancer, sCHF with EF of 25%, mitral valve repair, CKD-3 .  [name] Now patient has presenting with acute on chronic respiratory failure with hypoxia due to combination of COPD and CHF exacerbation.  . The worrisome physical exam findings include crackles and rhonchi on auscultation . The  initial radiographic and laboratory data are worrisome because of elevated troponin, leukocytosis, hypokalemia, elevated BNP 3634.  Chest x-ray showed interstitial pulmonary edema. . Current medical needs: please see my assessment and plan . Predictability of an adverse outcome (risk): Patient has multiple comorbidities as listed above. Now presents with acute on chronic respiratory failure with hypoxia due to combination of COPD and CHF exacerbation. Patient's presentation is highly complicated.  Patient is at high risk of deteriorating.  Will need to be treated in hospital for at least 2 days.             DVT ppx: SQ Lovenox Code Status: Partial code (I discussed with the patient, and explained the meaning of CODE STATUS, patient wants to be partial code, OK for CPR, but no intubation). Family Communication: no, no one at bed side.  Disposition Plan:  Anticipate discharge back to previous home environment Consults called:  none Admission status:  SDU/inpation       Date of Service 12/31/2019    [facility]   If 7PM-7AM, please contact night-coverage [email or web address] 12/31/2019, 10:53 AM

## 2019-12-31 NOTE — TOC Initial Note (Signed)
Transition of Care [facility]) - Initial/Assessment Note    Patient Details  Name: Ashley [name] MRN: [id number] Date of Birth: 1951-07-04  Transition of Care [facility]) CM/SW Contact:    Candie Chroman, LCSW Phone Number: 12/31/2019, 2:50 PM  Clinical Narrative:  Readmission prevention and heart failure screen completed last admission on 12/19/19: "RNCM assessed patient at bedside, sitting up in bed with oxygen via [place]. Patient alert and pleasant and reports that she is feeling much better than she did when she came in. Patient reports that she was supposed to be [facility] today to meet with surgeon that will likely due her heart valve surgery and have some testing done. Patient reports that she lives in single family home with her fiance and his brother. She reports that she no longer drives due to her health issues but that her fiance or his mother provides her transportation. Discussed HF management and patient reports that she gets up and weighs every day but that she never really has any fluctuations in her weight and that she never has any swelling. She reports that she does follow up with Darylene Price at the [facility] as well as her PCP and she has no trouble obtaining her medications. Patient reports that she is independent at home and does not for see any needs after going home. RNCM will continue to follow for any needs." CSW will continue to follow for discharge needs.                Expected Discharge Plan: Home/Self Care Barriers to Discharge: Continued Medical Work up   Patient Goals and CMS Choice        Expected Discharge Plan and Services Expected Discharge Plan: Home/Self Care     Post Acute Care Choice: NA                                        Prior Living Arrangements/Services   Lives with:: Roommate, Significant Other Patient language and need for interpreter reviewed:: Yes        Need for Family Participation in Patient Care: Yes (Comment) Care giver  support system in place?: Yes (comment) Current home services: DME Criminal Activity/Legal Involvement Pertinent to Current Situation/Hospitalization: No - Comment as needed  Activities of Daily Living Home Assistive Devices/Equipment: Cane (specify quad or straight) ADL Screening (condition at time of admission) Patient's cognitive ability adequate to safely complete daily activities?: Yes Is the patient deaf or have difficulty hearing?: No Does the patient have difficulty seeing, even when wearing glasses/contacts?: No Does the patient have difficulty concentrating, remembering, or making decisions?: No Patient able to express need for assistance with ADLs?: No Does the patient have difficulty dressing or bathing?: No Independently performs ADLs?: Yes (appropriate for developmental age) Does the patient have difficulty walking or climbing stairs?: No Weakness of Legs: None Weakness of Arms/Hands: None  Permission Sought/Granted                  Emotional Assessment Appearance:: Appears stated age     Orientation: : Oriented to Self, Oriented to Place, Oriented to  Time, Oriented to Situation Alcohol / Substance Use: Not Applicable Psych Involvement: No (comment)  Admission diagnosis:  COPD exacerbation ([place]) [J44.1] Acute on chronic systolic congestive heart failure (HCC) [I50.23] Acute on chronic systolic CHF (congestive heart failure) ([place]) [I50.23] Patient Active Problem List   Diagnosis  Date Noted  . Leukocytosis 12/31/2019  . Hypothermia 12/31/2019  . CKD (chronic kidney disease), stage IIIa 12/31/2019  . Pulmonary edema cardiac cause ([place]) 12/25/2019  . Hyperlipidemia   . COPD exacerbation ([place])   . Hyperkalemia   . Elevated troponin   . Acute renal failure superimposed on stage 3a chronic kidney disease ([place])   . Lactic acidosis   . Gouty arthritis of both feet 11/20/2019  . Mitral stenosis with insufficiency   . Hypertensive urgency   . Chronic renal  failure, stage 3a   . Depression   . Acute on chronic respiratory failure with hypoxia ([place]) 09/22/2019  . Hyperglycemia 09/22/2019  . Presence of permanent cardiac pacemaker   . Anemia   . Chronic kidney disease   . Hypertension   . Mitral valve disorder 08/25/2019  . Acute respiratory failure ([place]) 08/24/2019  . CHF (congestive heart failure) ([place]) 07/16/2019  . Cough with hemoptysis 06/09/2019  . Insomnia 05/07/2019  . Hypokalemia 03/23/2019  . Hyperglycemia, drug-induced 03/23/2019  . COVID-19 virus not detected 03/23/2019  . Respiratory failure ([place]) 03/16/2019  . AVM (arteriovenous malformation) of small bowel, acquired   . Anemia, iron deficiency 11/10/2018  . Acute on chronic systolic CHF (congestive heart failure) ([place]) 11/06/2018  . Rectal polyp   . Benign neoplasm of cecum   . Barrett's esophagus without dysplasia   . Stomach irritation   . Chronic diastolic heart failure ([place]) 07/03/2018  . HTN (hypertension) 07/03/2018  . COPD with emphysema ([place]) 06/28/2018  . B12 deficiency anemia 06/28/2018  . Prediabetes 05/11/2018  . Personal history of colon cancer   . Benign neoplasm of descending colon   . Polyp of sigmoid colon   . Benign neoplasm of transverse colon   . Diverticulosis of large intestine without diverticulitis   . 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 Renal artery stenosis ([place]) 10/03/2016  . 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 Hospital discharge follow-up 02/09/2015  . Cough in adult 10/21/2014  . Tobacco abuse counseling 10/11/2014  . Tobacco abuse 10/11/2014  . Chronic right hip pain 08/26/2014  . COPD with acute exacerbation ([place]) 06/18/2013  . Atherosclerosis of native artery of extremity with intermittent claudication ([place]) 06/18/2013  . Preoperative evaluation to rule out surgical contraindication 06/18/2013  . CAD (coronary artery disease) 06/01/2013  . GERD (gastroesophageal reflux disease)  06/01/2013  . Hypercholesterolemia 06/01/2013  . Tubular adenoma of colon 06/01/2013  . Major depressive disorder in remission ([place]) 06/01/2013   PCP:  [name], MD Pharmacy:   [place], [place], [place] [address] [place] [address] Phone: [phone] Fax: [place], [place] [address] [place] [address] Phone: [phone] Fax: [phone]     Social Determinants of Health ([place]) Interventions    Readmission Risk Interventions Readmission Risk Prevention Plan 12/19/2019 11/18/2019 10/16/2019  Transportation Screening Complete Complete Complete  PCP or Specialist Appt within 3-5 Days - - -  [place]RI or [place] - - -  Palliative Care Screening - - -  Medication Review (RN Care Manager) Complete Complete Complete  PCP or Specialist appointment within 3-5 days of discharge Complete Complete -  [place] or Home Care Consult Patient refused - Patient refused  SW Recovery Care/Counseling Consult - - Complete  Palliative Care Screening Not Applicable Not Applicable Not Applicable  Skilled  Nursing Facility Not Applicable Not Applicable Not Applicable  Some recent data might be hidden

## 2019-12-31 NOTE — Progress Notes (Signed)
Pt removed from BiPAP placed on 3Lnc. Pt in no distress will continue to monitor

## 2019-12-31 NOTE — ED Notes (Signed)
ED TO INPATIENT HANDOFF REPORT  ED Nurse Name and Phone #: Karena Addison [phone]  S Name/Age/Gender Ashley [name] 69 y.o. female Room/Bed: ED18A/ED18A  Code Status   Code Status: Prior  Home/SNF/Other Home Patient oriented to: self, place, time and situation Is this baseline? Yes   Triage Complete: Triage complete  Chief Complaint resp distress  Triage Note Patient presents to the ED via EMS from home with shortness of breath that began at 4am this morning.  Patient has been hospitalized recently for copd exacerbation.  Patient given 2 duonebs by EMS,  1/2 in nitro paste due to hypertension and 125mg  of IV solumedrol.  Patient tripoding on arrival to the ED.  Patient had been placed on cpap by ems and was placed on bipap in ED on arrival by RT.      Allergies Allergies  Allergen Reactions  . Vancomycin Nausea And Vomiting and Palpitations  . Hydrocodone-Acetaminophen Nausea Only    Level of Care/Admitting Diagnosis ED Disposition    ED Disposition Condition Comment   Admit  The patient appears reasonably stabilized for admission considering the current resources, flow, and capabilities available in the ED at this time, and I doubt any other [facility] requiring further screening and/or treatment in the ED prior to admission is  present.       B Medical/Surgery History Past Medical History:  Diagnosis Date  . AICD (automatic cardioverter/defibrillator) present    on right side  . Anemia   . CAD (coronary artery disease)    s/p CABG  . CHF (congestive heart failure) ([place])   . Chronic kidney disease    renal artery stenosis  . Colon cancer ([place])   . Depression   . GERD (gastroesophageal reflux disease)   . Hx of colonic polyps   . Hyperlipidemia   . Hypertension   . Mitral valve disorder    s/p mitral valve repair wth CABG  . Myocardial infarction ([place])   . Peripheral vascular disease ([place])   . Presence of permanent cardiac pacemaker    Pacemaker/ Defibrillator  .  Tobacco abuse 10/11/2014   Past Surgical History:  Procedure Laterality Date  . arm surgery     fracture, has plates and screws  . CABG with mitral valve repair    . CARDIAC CATHETERIZATION    . COLON SURGERY     colon cancer  . COLONOSCOPY WITH PROPOFOL N/A 10/09/2016   Procedure: COLONOSCOPY WITH PROPOFOL;  Surgeon: Jonathon Bellows, MD;  Location: [facility] ENDOSCOPY;  Service: Endoscopy;  Laterality: N/A;  . COLONOSCOPY WITH PROPOFOL N/A 07/24/2018   Procedure: COLONOSCOPY WITH PROPOFOL;  Surgeon: Virgel Manifold, MD;  Location: [facility] ENDOSCOPY;  Service: Endoscopy;  Laterality: N/A;  . CORONARY ARTERY BYPASS GRAFT    . ENTEROSCOPY N/A 11/21/2018   Procedure: ENTEROSCOPY-BALLOON;  Surgeon: Jonathon Bellows, MD;  Location: [facility] ENDOSCOPY;  Service: Gastroenterology;  Laterality: N/A;  . ESOPHAGOGASTRODUODENOSCOPY (EGD) WITH PROPOFOL N/A 07/24/2018   Procedure: ESOPHAGOGASTRODUODENOSCOPY (EGD) WITH PROPOFOL;  Surgeon: Virgel Manifold, MD;  Location: [facility] ENDOSCOPY;  Service: Endoscopy;  Laterality: N/A;  . GIVENS CAPSULE STUDY N/A 08/16/2018   Procedure: GIVENS CAPSULE STUDY;  Surgeon: Virgel Manifold, MD;  Location: [facility] ENDOSCOPY;  Service: Endoscopy;  Laterality: N/A;  . pace maker defib  2016  . PERIPHERAL VASCULAR CATHETERIZATION N/A 10/23/2016   Procedure: Renal Angiography;  Surgeon: Algernon Huxley, MD;  Location: [place] CV LAB;  Service: Cardiovascular;  Laterality: N/A;  . RIGHT/LEFT HEART CATH AND CORONARY  ANGIOGRAPHY N/A 08/26/2019   Procedure: RIGHT/LEFT HEART CATH AND CORONARY ANGIOGRAPHY;  Surgeon: Isaias Cowman, MD;  Location: [place] CV LAB;  Service: Cardiovascular;  Laterality: N/A;  . TOTAL ABDOMINAL HYSTERECTOMY  1999   history of abnormal pap     A IV Location/Drains/Wounds Patient Lines/Drains/Airways Status   Active Line/Drains/Airways    Name:   Placement date:   Placement time:   Site:   Days:   Peripheral IV 12/31/19 Posterior;Right Hand    12/31/19    --    Hand   less than 1   Peripheral IV 12/31/19 Right Hand   12/31/19    0906    Hand   less than 1          Intake/Output Last 24 hours No intake or output data in the 24 hours ending 12/31/19 1006  Labs/Imaging Results for orders placed or performed during the hospital encounter of 12/31/19 (from the past 48 hour(s))  CBC with Differential     Status: Abnormal   Collection Time: 12/31/19  9:00 AM  Result Value Ref Range   WBC 18.8 (H) 4.0 - 10.5 K/uL   RBC 4.30 3.87 - 5.11 MIL/uL   Hemoglobin 14.0 12.0 - 15.0 g/dL   HCT 41.3 36.0 - 46.0 %   MCV 96.0 80.0 - 100.0 fL   MCH 32.6 26.0 - 34.0 pg   MCHC 33.9 30.0 - 36.0 g/dL   RDW 14.5 11.5 - 15.5 %   Platelets 225 150 - 400 K/uL   nRBC 0.0 0.0 - 0.2 %   Neutrophils Relative % 84 %   Neutro Abs 16.1 (H) 1.7 - 7.7 K/uL   Lymphocytes Relative 7 %   Lymphs Abs 1.4 0.7 - 4.0 K/uL   Monocytes Relative 6 %   Monocytes Absolute 1.0 0.1 - 1.0 K/uL   Eosinophils Relative 1 %   Eosinophils Absolute 0.1 0.0 - 0.5 K/uL   Basophils Relative 1 %   Basophils Absolute 0.1 0.0 - 0.1 K/uL   Immature Granulocytes 1 %   Abs Immature Granulocytes 0.18 (H) 0.00 - 0.07 K/uL    Comment: Performed at [facility], [address] [place], [place] [address]  Brain natriuretic peptide     Status: Abnormal   Collection Time: 12/31/19  9:00 AM  Result Value Ref Range   B Natriuretic Peptide 3,634.0 (H) 0.0 - 100.0 pg/mL    Comment: Performed at [facility], [place], [place] [address]  Troponin I (High Sensitivity)     Status: Abnormal   Collection Time: 12/31/19  9:00 AM  Result Value Ref Range   Troponin I (High Sensitivity) 45 (H) <18 ng/L    Comment: (NOTE) Elevated high sensitivity troponin I (hsTnI) values and significant  changes across serial measurements may suggest ACS but many other  chronic and acute conditions are known to elevate hsTnI results.  Refer to the "Links" section for chest  pain algorithms and additional  guidance. Performed at [facility], [address] [place], [place] [address]   Comprehensive metabolic panel     Status: Abnormal   Collection Time: 12/31/19  9:00 AM  Result Value Ref Range   Sodium 137 135 - 145 mmol/L   Potassium 3.3 (L) 3.5 - 5.1 mmol/L   Chloride 100 98 - 111 mmol/L   CO2 21 (L) 22 - 32 mmol/L   Glucose, Bld 322 (H) 70 - 99 mg/dL    Comment: Glucose reference  range applies only to samples taken after fasting for at least 8 hours.   BUN 18 8 - 23 mg/dL   Creatinine, Ser 1.14 (H) 0.44 - 1.00 mg/dL   Calcium 8.7 (L) 8.9 - 10.3 mg/dL   Total Protein 7.0 6.5 - 8.1 g/dL   Albumin 3.8 3.5 - 5.0 g/dL   AST 21 15 - 41 U/L   ALT 18 0 - 44 U/L   Alkaline Phosphatase 64 38 - 126 U/L   Total Bilirubin 1.9 (H) 0.3 - 1.2 mg/dL   GFR calc non Af Amer 50 (L) >60 mL/min   GFR calc Af Amer 58 (L) >60 mL/min   Anion gap 16 (H) 5 - 15    Comment: Performed at [facility], [address] [place], [place] [address]  Blood gas, venous     Status: Abnormal   Collection Time: 12/31/19  9:01 AM  Result Value Ref Range   pH, Ven 7.37 7.250 - 7.430   pCO2, Ven 43 (L) 44.0 - 60.0 mmHg   pO2, Ven 91.0 (H) 32.0 - 45.0 mmHg   Bicarbonate 24.9 20.0 - 28.0 mmol/L   Acid-base deficit 0.6 0.0 - 2.0 mmol/L   O2 Saturation 96.8 %   Patient temperature 37.0    Collection site VEIN    Sample type VENOUS     Comment: Performed at [facility], [address] [place], [place] [address]   DG Chest 2 View  Result Date: 12/30/2019 CLINICAL DATA:  Recent pulmonary edema EXAM: CHEST - 2 VIEW COMPARISON:  December 21, 2019 and December 19, 2019 FINDINGS: There is cardiomegaly with mild pulmonary venous hypertension. Patient is status post coronary artery bypass grafting. Pacemaker leads are attached to the right atrium, right ventricle, and coronary sinus. There is trace interstitial edema. No airspace opacity or pleural effusion.  There is aortic atherosclerosis. No adenopathy. Postoperative changes noted in the left clavicle. IMPRESSION: Cardiomegaly with mild pulmonary vascular congestion. Trace interstitial edema. No pleural effusion or airspace consolidation. Postoperative changes as noted with pacemaker leads attached to right atrium, right ventricle, and coronary sinus. Status post coronary artery bypass grafting. Electronically Signed   By: Lowella Grip III M.D.   On: 12/30/2019 08:14   DG Chest Port 1 View  Result Date: 12/31/2019 CLINICAL DATA:  Shortness of breath EXAM: PORTABLE CHEST 1 VIEW COMPARISON:  December 29, 2019 FINDINGS: There remains interstitial edema, somewhat increased from 2 days prior. No consolidation or pleural effusion evident. There is cardiomegaly with pulmonary venous hypertension. Pacemaker leads are attached to the right atrium, right ventricle, and coronary sinus. Patient is status post coronary artery bypass grafting. No adenopathy. There is aortic atherosclerosis. Postoperative changes in left clavicle. IMPRESSION: Increase in interstitial edema compared to 2 days prior. No consolidation. Persistent cardiomegaly with pulmonary venous hypertension consistent with pulmonary vascular congestion. Findings appear consistent with a degree of congestive heart failure. Postoperative changes noted.  Stable appearance of pacemaker leads. Electronically Signed   By: Lowella Grip III M.D.   On: 12/31/2019 09:17    Pending Labs Unresulted Labs (From admission, onward)    Start     Ordered   12/31/19 0845  Respiratory Panel by RT PCR (Flu A&B, Covid) - Nasopharyngeal Swab  (Tier 2 Respiratory Panel by RT PCR (Flu A&B, Covid) (TAT 2 hrs))  Once,   STAT    Question Answer Comment  Is this test for diagnosis or screening Diagnosis of ill patient   Symptomatic for  COVID-19 as defined by CDC Yes   Date of Symptom Onset 12/31/2019   Hospitalized for COVID-19 Yes   Admitted to ICU for COVID-19 No    Previously tested for COVID-19 Yes   Resident in a congregate (group) care setting No   Employed in healthcare setting No   Pregnant No      12/31/19 0844          Vitals/Pain Today's Vitals   12/31/19 0843 12/31/19 0848 12/31/19 0930  BP: (!) 163/127  (!) 126/99  Pulse: (!) 117  (!) 113  Resp: (!) 32  (!) 26  Temp: (!) 96.5 F (35.8 C)    TempSrc: Axillary    SpO2: 99%  95%  Weight:  74.4 kg   Height:  5\' 3"  (1.6 m)   PainSc:  8      Isolation Precautions No active isolations  Medications Medications  ipratropium-albuterol (DUONEB) 0.5-2.5 (3) MG/3ML nebulizer solution 3 mL (3 mLs Nebulization Given 12/31/19 0854)  morphine 4 MG/ML injection 4 mg (4 mg Intravenous Given 12/31/19 0917)  ondansetron (ZOFRAN) injection 4 mg (4 mg Intravenous Given 12/31/19 0917)  furosemide (LASIX) injection 80 mg (80 mg Intravenous Given 12/31/19 0949)    Mobility walks with person assist Low fall risk   Focused Assessments    R Recommendations: See Admitting Provider Note  Report given to:

## 2020-01-01 ENCOUNTER — Other Ambulatory Visit: Payer: Self-pay | Admitting: Internal Medicine

## 2020-01-01 DIAGNOSIS — J9621 Acute and chronic respiratory failure with hypoxia: Secondary | ICD-10-CM

## 2020-01-01 DIAGNOSIS — L8990 Pressure ulcer of unspecified site, unspecified stage: Secondary | ICD-10-CM | POA: Insufficient documentation

## 2020-01-01 LAB — BASIC METABOLIC PANEL
Anion gap: 11 (ref 5–15)
BUN: 32 mg/dL — ABNORMAL HIGH (ref 8–23)
CO2: 27 mmol/L (ref 22–32)
Calcium: 8.5 mg/dL — ABNORMAL LOW (ref 8.9–10.3)
Chloride: 96 mmol/L — ABNORMAL LOW (ref 98–111)
Creatinine, Ser: 1.44 mg/dL — ABNORMAL HIGH (ref 0.44–1.00)
GFR calc Af Amer: 44 mL/min — ABNORMAL LOW (ref >60)
GFR calc non Af Amer: 38 mL/min — ABNORMAL LOW (ref >60)
Glucose, Bld: 311 mg/dL — ABNORMAL HIGH (ref 70–99)
Potassium: 3.9 mmol/L (ref 3.5–5.1)
Sodium: 134 mmol/L — ABNORMAL LOW (ref 135–145)

## 2020-01-01 LAB — CBC
HCT: 34.4 % — ABNORMAL LOW (ref 36.0–46.0)
Hemoglobin: 12 g/dL (ref 12.0–15.0)
MCH: 33.1 pg (ref 26.0–34.0)
MCHC: 34.9 g/dL (ref 30.0–36.0)
MCV: 94.8 fL (ref 80.0–100.0)
Platelets: 166 10*3/uL (ref 150–400)
RBC: 3.63 MIL/uL — ABNORMAL LOW (ref 3.87–5.11)
RDW: 14.1 % (ref 11.5–15.5)
WBC: 11.6 10*3/uL — ABNORMAL HIGH (ref 4.0–10.5)
nRBC: 0 % (ref 0.0–0.2)

## 2020-01-01 LAB — LIPID PANEL
Cholesterol: 155 mg/dL (ref 0–200)
HDL: 50 mg/dL (ref >40)
LDL Cholesterol: 91 mg/dL (ref 0–99)
Total CHOL/HDL Ratio: 3.1 RATIO
Triglycerides: 70 mg/dL (ref <150)
VLDL: 14 mg/dL (ref 0–40)

## 2020-01-01 LAB — GLUCOSE, CAPILLARY
Glucose-Capillary: 283 mg/dL — ABNORMAL HIGH (ref 70–99)
Glucose-Capillary: 348 mg/dL — ABNORMAL HIGH (ref 70–99)

## 2020-01-01 LAB — HEMOGLOBIN A1C
Hgb A1c MFr Bld: 6.7 % — ABNORMAL HIGH (ref 4.8–5.6)
Mean Plasma Glucose: 145.59 mg/dL

## 2020-01-01 MED ORDER — FUROSEMIDE 20 MG PO TABS: 60.0000 mg | ORAL_TABLET | Freq: Two times a day (BID) | ORAL | 0 refills | Status: DC

## 2020-01-01 MED ORDER — PREDNISONE 20 MG PO TABS: 40.0000 mg | ORAL_TABLET | Freq: Every day | ORAL | 0 refills | Status: AC

## 2020-01-01 MED ORDER — POTASSIUM CHLORIDE ER 10 MEQ PO TBCR: 20.0000 meq | EXTENDED_RELEASE_TABLET | Freq: Every day | ORAL | 5 refills | Status: DC

## 2020-01-01 NOTE — Progress Notes (Signed)
Inpatient Diabetes Program Recommendations  AACE/ADA: New Consensus Statement on Inpatient Glycemic Control (2015)  Target Ranges:  Prepandial:   less than 140 mg/dL      Peak postprandial:   less than 180 mg/dL (1-2 hours)      Critically ill patients:  140 - 180 mg/dL   Lab Results  Component Value Date   GLUCAP 283 (H) 01/01/2020   HGBA1C 6.7 (H) 12/19/2019    Review of Glycemic Control Results for MICHEALE, SCHLACK" (MRN [id number]) as of 01/01/2020 09:56  Ref. Range 12/31/2019 11:17 12/31/2019 17:14 12/31/2019 21:29 01/01/2020 07:24  Glucose-Capillary Latest Ref Range: 70 - 99 mg/dL 187 (H) 294 (H) 249 (H) 283 (H)   Diabetes history: PreDM  Current orders for Inpatient glycemic control:  Novolog 0-9 units tid + hs  Solumedrol 40 mg Q12 hours BUN/Creat: 32/1.44  Inpatient Diabetes Program Recommendations:    Consider Levemir 6-8 units Q 24 hours.  Thanks,  Tama Headings RN, MSN, BC-ADM Inpatient Diabetes Coordinator Team Pager [phone] (8a-5p)

## 2020-01-01 NOTE — Discharge Summary (Signed)
Physician Discharge Summary  ARSHIA SPELLMAN CBS:[id number] DOB: 05-19-1951 DOA: 12/31/2019  PCP: [name], MD  Admit date: 12/31/2019 Discharge date: 01/01/2020  Admitted From: home Disposition:  home  Recommendations for Outpatient Follow-up:  1. Follow up with PCP in 1-2 weeks 2. Please obtain BMP/CBC in one week 3. Please follow up tomorrow at [facility] for your scheduled appointment regarding mitral valve surgery  Home Health: no  Equipment/Devices: none   Discharge Condition: stable  CODE STATUS: full  Diet recommendation: Heart Healthy   Brief/Interim Summary:  EMALY BOSCHERT is a 69 y.o. female with medical history significant of hypertension, hyperlipidemia, prediabetes, COPD, GERD, gout, depression, former smoker, AICD placement, CAD, CABG, colon cancer, sCHF with EF of 25%, mitral valve repair, CKD-3, who presents with worsening shortness of breath.  Upon presentation, was in respiratory distress, requiring BiPAP in ED.  Labs showed BNP 3634, troponin 45, negative RVP for Covid, stable renal function, potassium 3.3, temperature 96.5, blood pressure 163/127, tachycardia, tachypnea, patient has oxygen saturation 99% on room.  Admitted to stepdown unit on hospitalist service.  Patient's respiratory failure completely resolved by following morning.  She had missed appointment yesterday when admitted, was to be seen at [facility] regarding possible mitral valve surgery.  She has another appointment for related follow up tomorrow.    Increased home Lasix to 60 mg BID until cardiology follow up.  Patient clinically improved more rapidly than anticipated, and is stable for discharge today.  In addition, patient has appointment at [facility] tomorrow regarding possible mitral valve surgery.  This is of utmost importance, as mitral valve disease likely is contributing to patient's recurrent admissions for acute pulmonary edema.     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)   Prediabetes   Acute on chronic systolic CHF (congestive heart failure) (HCC)   Hypokalemia   Hypertension   Hyperlipidemia   COPD exacerbation (HCC)   Elevated troponin   Leukocytosis   Hypothermia   CKD (chronic kidney disease), stage IIIa   Pressure injury of skin    Discharge Instructions   Discharge Instructions    (HEART FAILURE PATIENTS) Call MD:  Anytime you have any of the following symptoms: 1) 3 pound weight gain in 24 hours or 5 pounds in 1 week 2) shortness of breath, with or without a dry hacking cough 3) swelling in the hands, feet or stomach 4) if you have to sleep on extra pillows at night in order to breathe.   Complete by: As directed    Call MD for:  extreme fatigue   Complete by: As directed    Call MD for:  temperature >100.4   Complete by: As directed    Diet - low sodium heart healthy   Complete by: As directed    Discharge instructions   Complete by: As directed    Increase Lasix dose to 60 mg twice daily, until you see cardiology.  Follow up at [facility] as previously scheduled regarding possible heart valve surgery.   Increase activity slowly   Complete by: As directed      Allergies as of 01/01/2020      Reactions   Vancomycin Nausea And Vomiting, Palpitations   Hydrocodone-acetaminophen Nausea Only      Medication List    TAKE these medications  albuterol 108 (90 Base) MCG/ACT inhaler Commonly known as: Ventolin HFA INHALE 2 PUFFS BY MOUTH INTO THE LUNGS EVERY 3 HOURS AS NEEDED FOR WHEEZING   ALPRAZolam 0.5 MG tablet Commonly known as: XANAX Take 0.5-1 tablets (0.25-0.5 mg total) by mouth at bedtime. TAKE ONE TABLET BY MOUTH AT BEDTIME AS NEEDED FOR ANXIETY What changed: additional instructions   aspirin 81 MG tablet Take 81 mg by mouth  daily.   atorvastatin 40 MG tablet Commonly known as: LIPITOR TAKE ONE TABLET BY MOUTH ONCE DAILY What changed: when to take this   carvedilol 6.25 MG tablet Commonly known as: COREG Take 1 tablet (6.25 mg total) by mouth 2 (two) times daily with a meal.   clopidogrel 75 MG tablet Commonly known as: PLAVIX Take 1 tablet (75 mg total) by mouth daily.   cyanocobalamin 1000 MCG/ML injection Commonly known as: (VITAMIN B-12) Inject 1 mL into the muscle monthly What changed:   how much to take  how to take this  when to take this  additional instructions   Diphenhist 25 mg capsule Generic drug: diphenhydrAMINE Take 25 mg by mouth at bedtime as needed for allergies or sleep.   Entresto 24-26 MG Generic drug: sacubitril-valsartan Take 1 tablet by mouth 2 (two) times daily.   escitalopram 10 MG tablet Commonly known as: LEXAPRO Take 1 tablet (10 mg total) by mouth daily.   febuxostat 40 MG tablet Commonly known as: Uloric Take 1 tablet (40 mg total) by mouth daily.   ferrous sulfate 325 (65 FE) MG EC tablet Take 325 mg by mouth daily with breakfast.   furosemide 20 MG tablet Commonly known as: LASIX Take 3 tablets (60 mg total) by mouth 2 (two) times daily. What changed:   medication strength  how much to take   ipratropium-albuterol 0.5-2.5 (3) MG/3ML Soln Commonly known as: DUONEB Take 3 mLs by nebulization every 6 (six) hours as needed.   nitroGLYCERIN 0.4 MG SL tablet Commonly known as: NITROSTAT Place 1 tablet (0.4 mg total) under the tongue every 5 (five) minutes as needed for chest pain.   omeprazole 40 MG capsule Commonly known as: PRILOSEC Take 1 capsule (40 mg total) by mouth daily.   potassium chloride 10 MEQ tablet Commonly known as: KLOR-CON Take 10 mEq by mouth daily.   predniSONE 20 MG tablet Commonly known as: DELTASONE Take 2 tablets (40 mg total) by mouth daily for 4 days.   spironolactone 25 MG tablet Commonly known as:  Aldactone Take 0.5 tablets (12.5 mg total) by mouth daily.   traZODone 50 MG tablet Commonly known as: DESYREL Take 0.5-1 tablets (25-50 mg total) by mouth daily. One hour before bedtime What changed: how much to take   Trelegy Ellipta 100-62.5-25 MCG/INH Aepb Generic drug: Fluticasone-Umeclidin-Vilant Inhale 1 puff into the lungs daily.      Follow-up Information    [facility] up on 01/05/2020.   Specialty: Cardiology Why: at 10:00am. Enter through the [place] entrance Contact information: [place]         Allergies  Allergen Reactions  . Vancomycin Nausea And Vomiting and Palpitations  . Hydrocodone-Acetaminophen Nausea Only      Procedures/Studies: DG Chest 2 View  Result Date: 12/30/2019 CLINICAL DATA:  Recent pulmonary edema EXAM: CHEST - 2 VIEW COMPARISON:  December 21, 2019 and December 19, 2019 FINDINGS: There is cardiomegaly with mild pulmonary venous hypertension. Patient is  status post coronary artery bypass grafting. Pacemaker leads are attached to the right atrium, right ventricle, and coronary sinus. There is trace interstitial edema. No airspace opacity or pleural effusion. There is aortic atherosclerosis. No adenopathy. Postoperative changes noted in the left clavicle. IMPRESSION: Cardiomegaly with mild pulmonary vascular congestion. Trace interstitial edema. No pleural effusion or airspace consolidation. Postoperative changes as noted with pacemaker leads attached to right atrium, right ventricle, and coronary sinus. Status post coronary artery bypass grafting. Electronically Signed   By: Lowella Grip III M.D.   On: 12/30/2019 08:14   DG Ribs Unilateral W/Chest Left  Result Date: 12/21/2019 CLINICAL DATA:  Cough with left-sided chest pain, initial encounter EXAM: LEFT RIBS AND CHEST - 3+ VIEW COMPARISON:  12/19/2019 FINDINGS: Cardiac  shadow is prominent but stable. Defibrillator is again seen as are postsurgical changes. Lungs are well aerated bilaterally with stable left basilar atelectasis. No acute rib fracture is noted. No pneumothorax is seen. Iliac stents are noted bilaterally. IMPRESSION: No evidence of acute rib abnormality. Mild left basilar atelectasis. Electronically Signed   By: Inez Catalina M.D.   On: 12/21/2019 11:35   DG Chest Port 1 View  Result Date: 12/31/2019 CLINICAL DATA:  Shortness of breath EXAM: PORTABLE CHEST 1 VIEW COMPARISON:  December 29, 2019 FINDINGS: There remains interstitial edema, somewhat increased from 2 days prior. No consolidation or pleural effusion evident. There is cardiomegaly with pulmonary venous hypertension. Pacemaker leads are attached to the right atrium, right ventricle, and coronary sinus. Patient is status post coronary artery bypass grafting. No adenopathy. There is aortic atherosclerosis. Postoperative changes in left clavicle. IMPRESSION: Increase in interstitial edema compared to 2 days prior. No consolidation. Persistent cardiomegaly with pulmonary venous hypertension consistent with pulmonary vascular congestion. Findings appear consistent with a degree of congestive heart failure. Postoperative changes noted.  Stable appearance of pacemaker leads. Electronically Signed   By: Lowella Grip III M.D.   On: 12/31/2019 09:17   DG Chest Port 1 View  Result Date: 12/19/2019 CLINICAL DATA:  Concern for pulmonary abscess EXAM: PORTABLE CHEST 1 VIEW COMPARISON:  Chest radiograph from one day prior. FINDINGS: Stable configuration of 3 lead right subclavian ICD and median sternotomy wires. Stable cardiomediastinal silhouette with moderate cardiomegaly. No pneumothorax. Trace right pleural effusion appears new. No left pleural effusion. No overt pulmonary edema, improved. Mild left basilar atelectasis. IMPRESSION: 1. Stable moderate cardiomegaly without overt pulmonary edema. 2. New trace  right pleural effusion. 3. Mild left basilar atelectasis. Electronically Signed   By: Ilona Sorrel M.D.   On: 12/19/2019 11:43   DG Chest Portable 1 View  Result Date: 12/18/2019 CLINICAL DATA:  Respiratory distress EXAM: PORTABLE CHEST 1 VIEW COMPARISON:  11/18/2019 FINDINGS: Cardiomegaly with biventricular pacer and CABG. Diffuse interstitial opacity with Kerley lines. No visible effusion or air leak. There is artifact from hardware. IMPRESSION: CHF. Electronically Signed   By: Monte Fantasia M.D.   On: 12/18/2019 07:41   ECHOCARDIOGRAM COMPLETE  Result Date: 12/31/2019    ECHOCARDIOGRAM REPORT   Patient Name:   TERISHA LOSASSO [facility] Date of Exam: 12/31/2019 Medical Rec #:  [id number]         Height:       63.0 in Accession #:    [id number]        Weight:       165.1 lb Date of Birth:  1951/09/30         BSA:          1.782 m Patient  Age:    69 years          BP:           147/98 mmHg Patient Gender: F                 HR:           103 bpm. Exam Location:  [facility] Procedure: 2D Echo, Cardiac Doppler and Color Doppler Indications:     CHF- acute diastolic 616.07  History:         Patient has prior history of Echocardiogram examinations, most                  recent 12/18/2019. CHF; Pacemaker. Mitral valve disorder, MI,                  PVD, AICD.  Sonographer:     Sherrie Sport RDCS (AE) Referring Phys:  [id number] Soledad Gerlach NIU Diagnosing Phys: Serafina Royals MD  Sonographer Comments: Technically challenging study due to limited acoustic windows. IMPRESSIONS  1. Left ventricular ejection fraction, by estimation, is 40 to 45%. The left ventricle has mildly decreased function. The left ventricle demonstrates global hypokinesis. There is moderate left ventricular hypertrophy. Left ventricular diastolic parameters are consistent with Grade I diastolic dysfunction (impaired relaxation).  2. Right ventricular systolic function is normal. The right ventricular size is normal. There is normal pulmonary artery systolic pressure.  3.  Left atrial size was moderately dilated.  4. Right atrial size was mildly dilated.  5. 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 The mitral valve is abnormal. Moderate mitral valve regurgitation.  6. The aortic valve is normal in structure and function. Aortic valve regurgitation is trivial. FINDINGS  Left Ventricle: Left ventricular ejection fraction, by estimation, is 40 to 45%. The left ventricle has mildly decreased function. The left ventricle demonstrates global hypokinesis. The left ventricular internal cavity size was normal in size. There is  moderate left ventricular hypertrophy. Left ventricular diastolic parameters are consistent with Grade I diastolic dysfunction (impaired relaxation). Right Ventricle: The right ventricular size is normal. No increase in right ventricular wall thickness. Right ventricular systolic function is normal. There is normal pulmonary artery systolic pressure. The tricuspid regurgitant velocity is 1.82 m/s, and  with an assumed right atrial pressure of 10 mmHg, the estimated right ventricular systolic pressure is 62.6 mmHg. Left Atrium: Left atrial size was moderately dilated. Right Atrium: Right atrial size was mildly dilated. Pericardium: There is no evidence of pericardial effusion. 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 The mitral valve is abnormal. Moderate mitral valve regurgitation. MV peak gradient, 25.8 mmHg. The mean mitral valve gradient is 13.0 mmHg. Tricuspid Valve: The tricuspid valve is normal in structure. Tricuspid valve regurgitation is mild. Aortic Valve: The aortic valve is normal in structure and function. Aortic valve regurgitation is trivial. Aortic valve mean gradient measures 7.4 mmHg. Aortic valve peak gradient measures 12.0 mmHg. Aortic valve area, by VTI measures 1.66 cm. Pulmonic Valve: The pulmonic valve was normal in structure. Pulmonic valve regurgitation is trivial.  Aorta: The aortic root and ascending aorta are structurally normal, with no evidence of dilitation. IAS/Shunts: No atrial level shunt detected by color flow Doppler.  LEFT VENTRICLE PLAX 2D LVIDd:         4.20 cm  Diastology LVIDs:         3.08 cm  LV e' lateral:   4.03 cm/s LV PW:  1.85 cm  LV E/e' lateral: 44.2 LV IVS:        1.76 cm  LV e' medial:    2.83 cm/s LVOT diam:     2.00 cm  LV E/e' medial:  62.9 LV SV:         50 LV SV Index:   28 LVOT Area:     3.14 cm  RIGHT VENTRICLE RV Basal diam:  3.14 cm LEFT ATRIUM              Index       RIGHT ATRIUM           Index LA diam:        5.20 cm  2.92 cm/m  RA Area:     19.00 cm LA Vol (A2C):   149.0 ml 83.59 ml/m RA Volume:   54.70 ml  30.69 ml/m LA Vol (A4C):   87.6 ml  49.15 ml/m LA Biplane Vol: 121.0 ml 67.89 ml/m  AORTIC VALVE                    PULMONIC VALVE AV Area (Vmax):    1.11 cm     PV Vmax:        0.78 m/s AV Area (Vmean):   1.09 cm     PV Peak grad:   2.4 mmHg AV Area (VTI):     1.66 cm     RVOT Peak grad: 3 mmHg AV Vmax:           173.00 cm/s AV Vmean:          123.800 cm/s AV VTI:            0.300 m AV Peak Grad:      12.0 mmHg AV Mean Grad:      7.4 mmHg LVOT Vmax:         61.10 cm/s LVOT Vmean:        42.900 cm/s LVOT VTI:          0.159 m LVOT/AV VTI ratio: 0.53  AORTA Ao Root diam: 2.90 cm MITRAL VALVE                TRICUSPID VALVE MV Area (PHT): 2.93 cm     TR Peak grad:   13.2 mmHg MV Peak grad:  25.8 mmHg    TR Vmax:        182.00 cm/s MV Mean grad:  13.0 mmHg MV Vmax:       2.54 m/s     SHUNTS MV Vmean:      162.0 cm/s   Systemic VTI:  0.16 m MV Decel Time: 259 msec     Systemic Diam: 2.00 cm MV E velocity: 178.00 cm/s MV A velocity: 158.00 cm/s MV E/A ratio:  1.13 Serafina Royals MD Electronically signed by Serafina Royals MD Signature Date/Time: 12/31/2019/5:32:15 PM    Final    ECHOCARDIOGRAM COMPLETE  Result Date: 12/18/2019    ECHOCARDIOGRAM REPORT   Patient Name:   [facility] Date of Exam: 12/18/2019 Medical  Rec #:  [id number]         Height:       63.0 in Accession #:    [id number]        Weight:       166.0 lb Date of Birth:  1951/08/19         BSA:          1.79 m Patient Age:    37  years          BP:           136/108 mmHg Patient Gender: F                 HR:           100 bpm. Exam Location:  [facility] Procedure: 2D Echo, Color Doppler and Cardiac Doppler Indications:     CHF- acute diastolic 159.45  History:         Patient has prior history of Echocardiogram examinations, most                  recent 11/18/2019. Pacemaker; Risk Factors:Hypertension.                  AICD,MI, mitral valve disorder.  Sonographer:     Sherrie Sport RDCS (AE) Referring Phys:  Baker Janus Soledad Gerlach NIU Diagnosing Phys: Ida Rogue MD  Sonographer Comments: Suboptimal parasternal window and no subcostal window. IMPRESSIONS  1. Left ventricular ejection fraction, by estimation, is 20 to 25%. Left ventricular ejection fraction by 2D MOD biplane is 25.3 %. The left ventricle has severely decreased function. The left ventrical demonstrates global hypokinesis. The left ventricular internal cavity size was moderately dilated. There is moderately increased left ventricular hypertrophy. Left ventricular diastolic parameters are indeterminate.  2. Right ventricular systolic function is normal. The right ventricular size is normal. There is mildly elevated pulmonary artery systolic pressure. The estimated right ventricular systolic pressure is 85.9 mmHg.  3. Left atrial size was moderately dilated.  4. The mitral valve has been repaired/replaced, very mobile anterior leaflet, posterior leaflet not well visualized. At least moderate mitral valve regurgitation. Visually there appears to be mild mitral stenosis, though mean graient estimates a higher degree of stenosis. FINDINGS  Left Ventricle: Left ventricular ejection fraction, by estimation, is 20 to 25%. Left ventricular ejection fraction by 2D MOD biplane is 25.3 % The left ventricle has severely decreased  function. The left ventricle demonstrates global hypokinesis. The left ventricular internal cavity size was moderately dilated. There is moderately increased left ventricular hypertrophy. Left ventricular diastolic parameters are indeterminate. Right Ventricle: The right ventricular size is normal. No increase in right ventricular wall thickness. Right ventricular systolic function is normal. There is mildly elevated pulmonary artery systolic pressure. The tricuspid regurgitant velocity is 2.34  m/s, and with an assumed right atrial pressure of 10 mmHg, the estimated right ventricular systolic pressure is 29.2 mmHg. Left Atrium: Left atrial size was moderately dilated. Right Atrium: Right atrial size was normal in size. Pericardium: There is no evidence of pericardial effusion. Mitral Valve: The mitral valve has been repaired/replaced. Normal mobility of the mitral valve leaflets. Moderate mitral valve regurgitation. Mild mitral valve stenosis. MV peak gradient, 26.0 mmHg. The mean mitral valve gradient is 16.0 mmHg. Tricuspid Valve: The tricuspid valve is normal in structure. Tricuspid valve regurgitation is mild . No evidence of tricuspid stenosis. Aortic Valve: The aortic valve was not well visualized. Aortic valve regurgitation is not visualized. Mild to moderate aortic valve sclerosis/calcification is present, without any evidence of aortic stenosis. Aortic valve mean gradient measures 4.3 mmHg.  Aortic valve peak gradient measures 7.1 mmHg. Aortic valve area, by VTI measures 2.09 cm. Pulmonic Valve: The pulmonic valve was normal in structure. Pulmonic valve regurgitation is not visualized. No evidence of pulmonic stenosis. Aorta: The aortic root is normal in size and structure. Venous: The inferior vena cava is normal in size with greater than 50% respiratory  variability, suggesting right atrial pressure of 3 mmHg. The inferior vena cava and the hepatic vein show a normal flow pattern. IAS/Shunts: No atrial  level shunt detected by color flow Doppler.  LEFT VENTRICLE PLAX 2D                        Biplane EF (MOD) LVIDd:         4.59 cm         LV Biplane EF:   Left LVIDs:         3.41 cm                          ventricular LV PW:         1.71 cm                          ejection LV IVS:        1.25 cm                          fraction by LVOT diam:     2.20 cm                          2D MOD LV SV:         41.81 ml                         biplane is LV SV Index:   26.47                            25.3 % LVOT Area:     3.80 cm                                Diastology                                LV e' lateral:   7.40 cm/s LV Volumes (MOD)               LV E/e' lateral: 16.2 LV area d, A2C:  50.20 cm     LV e' medial:    5.98 cm/s LV area d, A4C:  39.30 cm     LV E/e' medial:  20.0 LV area s, A2C:  41.90 cm LV area s, A4C:  36.20 cm LV major d, A2C: 8.69 cm LV major d, A4C: 7.73 cm LV major s, A2C: 8.11 cm LV major s, A4C: 7.88 cm LV vol d, MOD    244.0 ml A2C: LV vol d, MOD    167.0 ml A4C: LV vol s, MOD    183.0 ml A2C: LV vol s, MOD    137.0 ml A4C: LV SV MOD A2C:   61.0 ml LV SV MOD A4C:   167.0 ml LV SV MOD BP:    54.0 ml RIGHT VENTRICLE RV Basal diam:  3.50 cm RV S prime:     6.64 cm/s TAPSE (M-mode): 3.2 cm LEFT ATRIUM             Index       RIGHT ATRIUM  Index LA diam:        2.60 cm 1.46 cm/m  RA Area:     22.90 cm LA Vol (A2C):   87.7 ml 49.09 ml/m RA Volume:   70.10 ml  39.24 ml/m LA Vol (A4C):   80.9 ml 45.29 ml/m LA Biplane Vol: 86.9 ml 48.64 ml/m  AORTIC VALVE AV Area (Vmax):    1.82 cm AV Area (Vmean):   1.69 cm AV Area (VTI):     2.09 cm AV Vmax:           133.67 cm/s AV Vmean:          91.400 cm/s AV VTI:            0.200 m AV Peak Grad:      7.1 mmHg AV Mean Grad:      4.3 mmHg LVOT Vmax:         63.90 cm/s LVOT Vmean:        40.600 cm/s LVOT VTI:          0.110 m LVOT/AV VTI ratio: 0.55  AORTA Ao Root diam: 2.70 cm MITRAL VALVE                         TRICUSPID VALVE MV Area  (PHT): 4.96 cm              TR Peak grad:   21.9 mmHg MV Peak grad:  26.0 mmHg             TR Vmax:        234.00 cm/s MV Mean grad:  16.0 mmHg MV Vmax:       2.55 m/s              SHUNTS MV Vmean:      183.0 cm/s            Systemic VTI:  0.11 m MV Decel Time: 153 msec              Systemic Diam: 2.20 cm MV E velocity: 119.80 cm/s 103 cm/s MV A velocity: 109.75 cm/s 70.3 cm/s MV E/A ratio:  1.09        1.5 Ida Rogue MD Electronically signed by Ida Rogue MD Signature Date/Time: 12/18/2019/6:06:18 PM    Final        Subjective: Patient seen this AM.  Feels better.  Off oxygen.  Asks for discharge today or transfer to [place] so she can make her appointment tomorrow about valve surgery.  Denies CP, SOB or other complaints.   Discharge Exam: Vitals:   01/01/20 1100 01/01/20 1200  BP:  117/71  Pulse: 78 82  Resp: 19 (!) 22  Temp:  (!) 97.4 F (36.3 C)  SpO2: 96% 97%   Vitals:   01/01/20 0900 01/01/20 1000 01/01/20 1100 01/01/20 1200  BP: 102/78 128/78  117/71  Pulse: 85 83 78 82  Resp: _0 (!) 22  Temp:    (!) 97.4 F (36.3 C)  TempSrc:    Oral  SpO2: 98% 98% 96% 97%  Weight:      Height:        General: Pt is alert, awake, not in acute distress Cardiovascular: RRR, S1/S2 +, no rubs, no gallops Respiratory: CTA bilaterally, no wheezing, no rhonchi Abdominal: Soft, NT, ND, bowel sounds + Extremities: no edema, no cyanosis    The results of significant diagnostics from this hospitalization (including imaging, microbiology, ancillary and laboratory) are  listed below for reference.     Microbiology: Recent Results (from the past 240 hour(s))  Respiratory Panel by RT PCR (Flu A&B, Covid) - Nasopharyngeal Swab     Status: None   Collection Time: 12/31/19  9:07 AM   Specimen: Nasopharyngeal Swab  Result Value Ref Range Status   SARS Coronavirus 2 by RT PCR NEGATIVE NEGATIVE Final    Comment: (NOTE) SARS-CoV-2 target nucleic acids are NOT DETECTED. The SARS-CoV-2  RNA is generally detectable in upper respiratoy specimens during the acute phase of infection. The lowest concentration of SARS-CoV-2 viral copies this assay can detect is 131 copies/mL. A negative result does not preclude SARS-Cov-2 infection and should not be used as the sole basis for treatment or other patient management decisions. A negative result may occur with  improper specimen collection/handling, submission of specimen other than nasopharyngeal swab, presence of viral mutation(s) within the areas targeted by this assay, and inadequate number of viral copies (<131 copies/mL). A negative result must be combined with clinical observations, patient history, and epidemiological information. The expected result is Negative. Fact Sheet for Patients:  PinkCheek.be Fact Sheet for Healthcare Providers:  Gravel[employer]ags[employer]it This test is not yet ap proved or cleared by the [place] FDA and  has been authorized for detection and/or diagnosis of SARS-CoV-2 by FDA under an Emergency Use Authorization (EUA). This EUA will remain  in effect (meaning this test can be used) for the duration of the COVID-19 declaration under Section 564(b)(1) of the Act, 21 U.S.C. section 360bbb-3(b)(1), unless the authorization is terminated or revoked sooner.    Influenza A by PCR NEGATIVE NEGATIVE Final   Influenza B by PCR NEGATIVE NEGATIVE Final    Comment: (NOTE) The Xpert Xpress SARS-CoV-2/FLU/RSV assay is intended as an aid in  the diagnosis of influenza from Nasopharyngeal swab specimens and  should not be used as a sole basis for treatment. Nasal washings and  aspirates are unacceptable for Xpert Xpress SARS-CoV-2/FLU/RSV  testing. Fact Sheet for Patients: PinkCheek.be Fact Sheet for Healthcare Providers: [employer] This test is not yet approved or cleared by the [place] FDA  and  has been authorized for detection and/or diagnosis of SARS-CoV-2 by  FDA under an Emergency Use Authorization (EUA). This EUA will remain  in effect (meaning this test can be used) for the duration of the  Covid-19 declaration under Section 564(b)(1) of the Act, 21  U.S.C. section 360bbb-3(b)(1), unless the authorization is  terminated or revoked. Performed at [facility], [address] [place], [place] [address]   CULTURE, BLOOD (ROUTINE X 2) w Reflex to ID Panel     Status: None (Preliminary result)   Collection Time: 12/31/19 11:33 AM   Specimen: BLOOD  Result Value Ref Range Status   Specimen Description BLOOD RIGHT ANTECUBITAL  Final   Special Requests   Final    BOTTLES DRAWN AEROBIC AND ANAEROBIC Blood Culture adequate volume   Culture   Final    NO GROWTH < 24 HOURS Performed at [facility], [address], [place], [place] [address]    Report Status PENDING  Incomplete  CULTURE, BLOOD (ROUTINE X 2) w Reflex to ID Panel     Status: None (Preliminary result)   Collection Time: 12/31/19 11:34 AM   Specimen: BLOOD  Result Value Ref Range Status   Specimen Description BLOOD LEFT ANTECUBITAL  Final   Special Requests   Final    BOTTLES DRAWN AEROBIC AND ANAEROBIC Blood Culture adequate volume  Culture   Final    NO GROWTH < 24 HOURS Performed at [facility], [address] [place], [place] [address]    Report Status PENDING  Incomplete     Labs: BNP (last 3 results) Recent Labs    11/18/19 0005 12/18/19 0716 12/31/19 0900  BNP 4,403.0* 4,188.0* 0,630.1*   Basic Metabolic Panel: Recent Labs  Lab 12/29/19 1450 12/31/19 0900 01/01/20 0328  NA 138 137 134*  K 3.2* 3.3* 3.9  CL 99 100 96*  CO2 28 21* 27  GLUCOSE 122* 322* 311*  BUN 23 18 32*  CREATININE 1.12 1.14* 1.44*  CALCIUM 9.2 8.7* 8.5*  MG  --  1.7  --    Liver Function Tests: Recent Labs  Lab 12/31/19 0900  AST 21  ALT 18  ALKPHOS 64  BILITOT 1.9*   PROT 7.0  ALBUMIN 3.8   No results for input(s): LIPASE, AMYLASE in the last 168 hours. No results for input(s): AMMONIA in the last 168 hours. CBC: Recent Labs  Lab 12/31/19 0900 01/01/20 0328  WBC 18.8* 11.6*  NEUTROABS 16.1*  --   HGB 14.0 12.0  HCT 41.3 34.4*  MCV 96.0 94.8  PLT 225 166   Cardiac Enzymes: No results for input(s): CKTOTAL, CKMB, CKMBINDEX, TROPONINI in the last 168 hours. BNP: Invalid input(s): POCBNP CBG: Recent Labs  Lab 12/31/19 1117 12/31/19 1714 12/31/19 2129 01/01/20 0724 01/01/20 1112  GLUCAP 187* 294* 249* 283* 348*   D-Dimer No results for input(s): DDIMER in the last 72 hours. Hgb A1c Recent Labs    01/01/20 0328  HGBA1C 6.7*   Lipid Profile Recent Labs    01/01/20 0328  CHOL 155  HDL 50  LDLCALC 91  TRIG 70  CHOLHDL 3.1   Thyroid function studies No results for input(s): TSH, T4TOTAL, T3FREE, THYROIDAB in the last 72 hours.  Invalid input(s): FREET3 Anemia work up No results for input(s): VITAMINB12, FOLATE, FERRITIN, TIBC, IRON, RETICCTPCT in the last 72 hours. Urinalysis    Component Value Date/Time   COLORURINE YELLOW (A) 12/18/2019 0713   APPEARANCEUR CLEAR (A) 12/18/2019 0713   APPEARANCEUR Clear 08/19/2014 0025   LABSPEC 1.016 12/18/2019 0713   LABSPEC 1.009 08/19/2014 0025   PHURINE 5.0 12/18/2019 0713   GLUCOSEU NEGATIVE 12/18/2019 0713   GLUCOSEU 50 mg/dL 08/19/2014 0025   HGBUR NEGATIVE 12/18/2019 0713   BILIRUBINUR NEGATIVE 12/18/2019 0713   BILIRUBINUR Negative 08/19/2014 0025   KETONESUR NEGATIVE 12/18/2019 0713   PROTEINUR NEGATIVE 12/18/2019 0713   NITRITE NEGATIVE 12/18/2019 0713   LEUKOCYTESUR NEGATIVE 12/18/2019 0713   LEUKOCYTESUR Negative 08/19/2014 0025   Sepsis Labs Invalid input(s): PROCALCITONIN,  WBC,  LACTICIDVEN Microbiology Recent Results (from the past 240 hour(s))  Respiratory Panel by RT PCR (Flu A&B, Covid) - Nasopharyngeal Swab     Status: None   Collection Time: 12/31/19   9:07 AM   Specimen: Nasopharyngeal Swab  Result Value Ref Range Status   SARS Coronavirus 2 by RT PCR NEGATIVE NEGATIVE Final    Comment: (NOTE) SARS-CoV-2 target nucleic acids are NOT DETECTED. The SARS-CoV-2 RNA is generally detectable in upper respiratoy specimens during the acute phase of infection. The lowest concentration of SARS-CoV-2 viral copies this assay can detect is 131 copies/mL. A negative result does not preclude SARS-Cov-2 infection and should not be used as the sole basis for treatment or other patient management decisions. A negative result may occur with  improper specimen collection/handling, submission of specimen other than nasopharyngeal swab,  presence of viral mutation(s) within the areas targeted by this assay, and inadequate number of viral copies (<131 copies/mL). A negative result must be combined with clinical observations, patient history, and epidemiological information. The expected result is Negative. Fact Sheet for Patients:  PinkCheek.be Fact Sheet for Healthcare Providers:  [employer] This test is not yet ap proved or cleared by the [place] FDA and  has been authorized for detection and/or diagnosis of SARS-CoV-2 by FDA under an Emergency Use Authorization (EUA). This EUA will remain  in effect (meaning this test can be used) for the duration of the COVID-19 declaration under Section 564(b)(1) of the Act, 21 U.S.C. section 360bbb-3(b)(1), unless the authorization is terminated or revoked sooner.    Influenza A by PCR NEGATIVE NEGATIVE Final   Influenza B by PCR NEGATIVE NEGATIVE Final    Comment: (NOTE) The Xpert Xpress SARS-CoV-2/FLU/RSV assay is intended as an aid in  the diagnosis of influenza from Nasopharyngeal swab specimens and  should not be used as a sole basis for treatment. Nasal washings and  aspirates are unacceptable for Xpert Xpress SARS-CoV-2/FLU/RSV   testing. Fact Sheet for Patients: PinkCheek.be Fact Sheet for Healthcare Providers: [employer]ags[employer]it This test is not yet approved or cleared by the [place] FDA and  has been authorized for detection and/or diagnosis of SARS-CoV-2 by  FDA under an Emergency Use Authorization (EUA). This EUA will remain  in effect (meaning this test can be used) for the duration of the  Covid-19 declaration under Section 564(b)(1) of the Act, 21  U.S.C. section 360bbb-3(b)(1), unless the authorization is  terminated or revoked. Performed at [facility], [address] [place], [place] [address]   CULTURE, BLOOD (ROUTINE X 2) w Reflex to ID Panel     Status: None (Preliminary result)   Collection Time: 12/31/19 11:33 AM   Specimen: BLOOD  Result Value Ref Range Status   Specimen Description BLOOD RIGHT ANTECUBITAL  Final   Special Requests   Final    BOTTLES DRAWN AEROBIC AND ANAEROBIC Blood Culture adequate volume   Culture   Final    NO GROWTH < 24 HOURS Performed at [facility], [address], [place], [place] [address]    Report Status PENDING  Incomplete  CULTURE, BLOOD (ROUTINE X 2) w Reflex to ID Panel     Status: None (Preliminary result)   Collection Time: 12/31/19 11:34 AM   Specimen: BLOOD  Result Value Ref Range Status   Specimen Description BLOOD LEFT ANTECUBITAL  Final   Special Requests   Final    BOTTLES DRAWN AEROBIC AND ANAEROBIC Blood Culture adequate volume   Culture   Final    NO GROWTH < 24 HOURS Performed at [facility], [address], [place], [place] [address]    Report Status PENDING  Incomplete     Time coordinating discharge: Over 30 minutes  SIGNED:   Ezekiel Slocumb, DO [facility] 01/01/2020, 1:25 PM   If 7PM-7AM, please contact night-coverage [email or web address]

## 2020-01-01 NOTE — TOC Transition Note (Signed)
Transition of Care [facility]) - CM/SW Discharge Note   Patient Details  Name: Ashley [name] MRN: [id number] Date of Birth: Nov 12, 1950  Transition of Care [facility]) CM/SW Contact:  Candie Chroman, LCSW Phone Number: 01/01/2020, 1:29 PM   Clinical Narrative:  Patient has orders to discharge home today. No further concerns. CSW signing off.   Final next level of care: Home/Self Care Barriers to Discharge: Barriers Resolved   Patient Goals and CMS Choice        Discharge Placement                    Patient and family notified of of transfer: 01/01/20  Discharge Plan and Services     Post Acute Care Choice: NA                               Social Determinants of Health (SDOH) Interventions     Readmission Risk Interventions Readmission Risk Prevention Plan 12/31/2019 12/19/2019 11/18/2019  Transportation Screening Complete Complete Complete  PCP or Specialist Appt within 3-5 Days - - -  [place]RI or [place] - - -  Palliative Care Screening - - -  Medication Review ([place]) Complete Complete Complete  PCP or Specialist appointment within 3-5 days of discharge Complete Complete Complete  HRI or Home Care Consult - Patient refused -  SW Recovery Care/Counseling Consult - - -  Palliative Care Screening Not Applicable Not Applicable Not [place] Not Applicable Not Applicable Not Applicable  Some recent data might be hidden

## 2020-01-01 NOTE — Plan of Care (Signed)

## 2020-01-02 ENCOUNTER — Telehealth: Payer: Self-pay

## 2020-01-02 DIAGNOSIS — I700 Atherosclerosis of aorta: Secondary | ICD-10-CM | POA: Diagnosis not present

## 2020-01-02 DIAGNOSIS — M25551 Pain in right hip: Secondary | ICD-10-CM | POA: Diagnosis not present

## 2020-01-02 DIAGNOSIS — R079 Chest pain, unspecified: Secondary | ICD-10-CM | POA: Diagnosis not present

## 2020-01-02 DIAGNOSIS — Z951 Presence of aortocoronary bypass graft: Secondary | ICD-10-CM | POA: Diagnosis not present

## 2020-01-02 DIAGNOSIS — E1122 Type 2 diabetes mellitus with diabetic chronic kidney disease: Secondary | ICD-10-CM | POA: Diagnosis not present

## 2020-01-02 DIAGNOSIS — I2510 Atherosclerotic heart disease of native coronary artery without angina pectoris: Secondary | ICD-10-CM | POA: Diagnosis not present

## 2020-01-02 DIAGNOSIS — K219 Gastro-esophageal reflux disease without esophagitis: Secondary | ICD-10-CM | POA: Diagnosis not present

## 2020-01-02 DIAGNOSIS — Z95810 Presence of automatic (implantable) cardiac defibrillator: Secondary | ICD-10-CM | POA: Diagnosis not present

## 2020-01-02 DIAGNOSIS — I5022 Chronic systolic (congestive) heart failure: Secondary | ICD-10-CM | POA: Diagnosis not present

## 2020-01-02 DIAGNOSIS — I5023 Acute on chronic systolic (congestive) heart failure: Secondary | ICD-10-CM | POA: Diagnosis not present

## 2020-01-02 DIAGNOSIS — I150 Renovascular hypertension: Secondary | ICD-10-CM | POA: Diagnosis not present

## 2020-01-02 DIAGNOSIS — J984 Other disorders of lung: Secondary | ICD-10-CM | POA: Diagnosis not present

## 2020-01-02 DIAGNOSIS — N280 Ischemia and infarction of kidney: Secondary | ICD-10-CM | POA: Diagnosis not present

## 2020-01-02 DIAGNOSIS — I272 Pulmonary hypertension, unspecified: Secondary | ICD-10-CM | POA: Diagnosis not present

## 2020-01-02 DIAGNOSIS — I083 Combined rheumatic disorders of mitral, aortic and tricuspid valves: Secondary | ICD-10-CM | POA: Diagnosis not present

## 2020-01-02 DIAGNOSIS — D519 Vitamin B12 deficiency anemia, unspecified: Secondary | ICD-10-CM | POA: Diagnosis not present

## 2020-01-02 DIAGNOSIS — R918 Other nonspecific abnormal finding of lung field: Secondary | ICD-10-CM | POA: Diagnosis not present

## 2020-01-02 DIAGNOSIS — R0689 Other abnormalities of breathing: Secondary | ICD-10-CM | POA: Diagnosis not present

## 2020-01-02 DIAGNOSIS — E785 Hyperlipidemia, unspecified: Secondary | ICD-10-CM | POA: Diagnosis not present

## 2020-01-02 DIAGNOSIS — Z20822 Contact with and (suspected) exposure to covid-19: Secondary | ICD-10-CM | POA: Diagnosis not present

## 2020-01-02 DIAGNOSIS — I214 Non-ST elevation (NSTEMI) myocardial infarction: Secondary | ICD-10-CM | POA: Diagnosis not present

## 2020-01-02 DIAGNOSIS — E118 Type 2 diabetes mellitus with unspecified complications: Secondary | ICD-10-CM | POA: Diagnosis not present

## 2020-01-02 DIAGNOSIS — I0989 Other specified rheumatic heart diseases: Secondary | ICD-10-CM | POA: Diagnosis not present

## 2020-01-02 DIAGNOSIS — J811 Chronic pulmonary edema: Secondary | ICD-10-CM | POA: Diagnosis not present

## 2020-01-02 DIAGNOSIS — I255 Ischemic cardiomyopathy: Secondary | ICD-10-CM | POA: Diagnosis not present

## 2020-01-02 DIAGNOSIS — N1831 Chronic kidney disease, stage 3a: Secondary | ICD-10-CM | POA: Diagnosis not present

## 2020-01-02 DIAGNOSIS — G8929 Other chronic pain: Secondary | ICD-10-CM | POA: Diagnosis not present

## 2020-01-02 DIAGNOSIS — G4700 Insomnia, unspecified: Secondary | ICD-10-CM | POA: Diagnosis not present

## 2020-01-02 DIAGNOSIS — J449 Chronic obstructive pulmonary disease, unspecified: Secondary | ICD-10-CM | POA: Diagnosis not present

## 2020-01-02 DIAGNOSIS — E1151 Type 2 diabetes mellitus with diabetic peripheral angiopathy without gangrene: Secondary | ICD-10-CM | POA: Diagnosis not present

## 2020-01-02 DIAGNOSIS — Z7682 Awaiting organ transplant status: Secondary | ICD-10-CM | POA: Diagnosis not present

## 2020-01-02 DIAGNOSIS — E1165 Type 2 diabetes mellitus with hyperglycemia: Secondary | ICD-10-CM | POA: Diagnosis not present

## 2020-01-02 DIAGNOSIS — I5021 Acute systolic (congestive) heart failure: Secondary | ICD-10-CM | POA: Diagnosis not present

## 2020-01-02 DIAGNOSIS — I130 Hypertensive heart and chronic kidney disease with heart failure and stage 1 through stage 4 chronic kidney disease, or unspecified chronic kidney disease: Secondary | ICD-10-CM | POA: Diagnosis not present

## 2020-01-02 DIAGNOSIS — I25810 Atherosclerosis of coronary artery bypass graft(s) without angina pectoris: Secondary | ICD-10-CM | POA: Diagnosis not present

## 2020-01-02 DIAGNOSIS — I739 Peripheral vascular disease, unspecified: Secondary | ICD-10-CM | POA: Diagnosis not present

## 2020-01-02 DIAGNOSIS — I340 Nonrheumatic mitral (valve) insufficiency: Secondary | ICD-10-CM | POA: Diagnosis not present

## 2020-01-02 DIAGNOSIS — J849 Interstitial pulmonary disease, unspecified: Secondary | ICD-10-CM | POA: Diagnosis not present

## 2020-01-02 NOTE — Telephone Encounter (Signed)
Failed attempt to reach patient for transition of care. Will follow as appropriate.

## 2020-01-05 ENCOUNTER — Ambulatory Visit: Payer: Medicare Other | Admitting: Family

## 2020-01-05 DIAGNOSIS — I739 Peripheral vascular disease, unspecified: Secondary | ICD-10-CM | POA: Diagnosis not present

## 2020-01-05 DIAGNOSIS — I130 Hypertensive heart and chronic kidney disease with heart failure and stage 1 through stage 4 chronic kidney disease, or unspecified chronic kidney disease: Secondary | ICD-10-CM | POA: Diagnosis not present

## 2020-01-05 DIAGNOSIS — I5022 Chronic systolic (congestive) heart failure: Secondary | ICD-10-CM | POA: Diagnosis not present

## 2020-01-05 DIAGNOSIS — I10 Essential (primary) hypertension: Secondary | ICD-10-CM | POA: Diagnosis not present

## 2020-01-05 DIAGNOSIS — Z95810 Presence of automatic (implantable) cardiac defibrillator: Secondary | ICD-10-CM | POA: Diagnosis not present

## 2020-01-05 DIAGNOSIS — J811 Chronic pulmonary edema: Secondary | ICD-10-CM | POA: Diagnosis not present

## 2020-01-05 DIAGNOSIS — I0989 Other specified rheumatic heart diseases: Secondary | ICD-10-CM | POA: Diagnosis not present

## 2020-01-05 DIAGNOSIS — I5021 Acute systolic (congestive) heart failure: Secondary | ICD-10-CM | POA: Diagnosis not present

## 2020-01-05 DIAGNOSIS — N261 Atrophy of kidney (terminal): Secondary | ICD-10-CM | POA: Diagnosis not present

## 2020-01-05 DIAGNOSIS — E785 Hyperlipidemia, unspecified: Secondary | ICD-10-CM | POA: Diagnosis not present

## 2020-01-05 DIAGNOSIS — I150 Renovascular hypertension: Secondary | ICD-10-CM | POA: Diagnosis not present

## 2020-01-05 DIAGNOSIS — E1169 Type 2 diabetes mellitus with other specified complication: Secondary | ICD-10-CM | POA: Diagnosis not present

## 2020-01-05 DIAGNOSIS — Q249 Congenital malformation of heart, unspecified: Secondary | ICD-10-CM | POA: Diagnosis not present

## 2020-01-05 DIAGNOSIS — E1151 Type 2 diabetes mellitus with diabetic peripheral angiopathy without gangrene: Secondary | ICD-10-CM | POA: Diagnosis not present

## 2020-01-05 DIAGNOSIS — R079 Chest pain, unspecified: Secondary | ICD-10-CM | POA: Diagnosis not present

## 2020-01-05 DIAGNOSIS — Z951 Presence of aortocoronary bypass graft: Secondary | ICD-10-CM | POA: Diagnosis not present

## 2020-01-05 DIAGNOSIS — I340 Nonrheumatic mitral (valve) insufficiency: Secondary | ICD-10-CM | POA: Diagnosis not present

## 2020-01-05 DIAGNOSIS — M25551 Pain in right hip: Secondary | ICD-10-CM | POA: Diagnosis not present

## 2020-01-05 DIAGNOSIS — G8929 Other chronic pain: Secondary | ICD-10-CM | POA: Diagnosis not present

## 2020-01-05 DIAGNOSIS — G4700 Insomnia, unspecified: Secondary | ICD-10-CM | POA: Diagnosis not present

## 2020-01-05 DIAGNOSIS — I2510 Atherosclerotic heart disease of native coronary artery without angina pectoris: Secondary | ICD-10-CM | POA: Diagnosis not present

## 2020-01-05 DIAGNOSIS — I083 Combined rheumatic disorders of mitral, aortic and tricuspid valves: Secondary | ICD-10-CM | POA: Diagnosis not present

## 2020-01-05 DIAGNOSIS — R0689 Other abnormalities of breathing: Secondary | ICD-10-CM | POA: Diagnosis not present

## 2020-01-05 DIAGNOSIS — Z20822 Contact with and (suspected) exposure to covid-19: Secondary | ICD-10-CM | POA: Diagnosis not present

## 2020-01-05 DIAGNOSIS — I272 Pulmonary hypertension, unspecified: Secondary | ICD-10-CM | POA: Diagnosis not present

## 2020-01-05 DIAGNOSIS — E1165 Type 2 diabetes mellitus with hyperglycemia: Secondary | ICD-10-CM | POA: Diagnosis not present

## 2020-01-05 DIAGNOSIS — J449 Chronic obstructive pulmonary disease, unspecified: Secondary | ICD-10-CM | POA: Diagnosis not present

## 2020-01-05 DIAGNOSIS — Z008 Encounter for other general examination: Secondary | ICD-10-CM | POA: Diagnosis not present

## 2020-01-05 DIAGNOSIS — N1831 Chronic kidney disease, stage 3a: Secondary | ICD-10-CM | POA: Diagnosis not present

## 2020-01-05 DIAGNOSIS — D519 Vitamin B12 deficiency anemia, unspecified: Secondary | ICD-10-CM | POA: Diagnosis not present

## 2020-01-05 DIAGNOSIS — E1122 Type 2 diabetes mellitus with diabetic chronic kidney disease: Secondary | ICD-10-CM | POA: Diagnosis not present

## 2020-01-05 DIAGNOSIS — K219 Gastro-esophageal reflux disease without esophagitis: Secondary | ICD-10-CM | POA: Diagnosis not present

## 2020-01-05 DIAGNOSIS — I255 Ischemic cardiomyopathy: Secondary | ICD-10-CM | POA: Diagnosis not present

## 2020-01-05 DIAGNOSIS — Z7682 Awaiting organ transplant status: Secondary | ICD-10-CM | POA: Diagnosis not present

## 2020-01-05 DIAGNOSIS — I25810 Atherosclerosis of coronary artery bypass graft(s) without angina pectoris: Secondary | ICD-10-CM | POA: Diagnosis not present

## 2020-01-05 DIAGNOSIS — I5023 Acute on chronic systolic (congestive) heart failure: Secondary | ICD-10-CM | POA: Diagnosis not present

## 2020-01-05 LAB — CULTURE, BLOOD (ROUTINE X 2)
Culture: NO GROWTH
Culture: NO GROWTH
Special Requests: ADEQUATE
Special Requests: ADEQUATE

## 2020-01-05 MED ORDER — ASPIRIN 81 MG PO TBEC
81.00 | DELAYED_RELEASE_TABLET | ORAL | Status: DC
Start: 2020-01-09 — End: 2020-01-05

## 2020-01-05 MED ORDER — PANTOPRAZOLE SODIUM 40 MG PO TBEC
40.00 | DELAYED_RELEASE_TABLET | ORAL | Status: DC
Start: 2020-01-09 — End: 2020-01-05

## 2020-01-05 MED ORDER — IPRATROPIUM-ALBUTEROL 0.5-2.5 (3) MG/3ML IN SOLN
3.00 | RESPIRATORY_TRACT | Status: DC
Start: 2020-01-08 — End: 2020-01-05

## 2020-01-05 MED ORDER — SACUBITRIL-VALSARTAN 49-51 MG PO TABS
1.00 | ORAL_TABLET | ORAL | Status: DC
Start: 2020-01-05 — End: 2020-01-05

## 2020-01-05 MED ORDER — DSS 100 MG PO CAPS
100.00 | ORAL_CAPSULE | ORAL | Status: DC
Start: 2020-01-08 — End: 2020-01-05

## 2020-01-05 MED ORDER — MILRINONE LACTATE IN DEXTROSE 20-5 MG/100ML-% IV SOLN
0.25 | INTRAVENOUS | Status: DC
Start: ? — End: 2020-01-05

## 2020-01-05 MED ORDER — FERROUS SULFATE 324 MG PO TBEC
324.00 | DELAYED_RELEASE_TABLET | ORAL | Status: DC
Start: 2020-01-09 — End: 2020-01-05

## 2020-01-05 MED ORDER — DEXTROSE 50 % IV SOLN
12.50 | INTRAVENOUS | Status: DC
Start: ? — End: 2020-01-05

## 2020-01-05 MED ORDER — ACETAMINOPHEN 325 MG PO TABS
650.00 | ORAL_TABLET | ORAL | Status: DC
Start: ? — End: 2020-01-05

## 2020-01-05 MED ORDER — SPIRONOLACTONE 25 MG PO TABS
12.50 | ORAL_TABLET | ORAL | Status: DC
Start: 2020-01-05 — End: 2020-01-05

## 2020-01-05 MED ORDER — LIDOCAINE HCL 1 % IJ SOLN
0.50 | INTRAMUSCULAR | Status: DC
Start: ? — End: 2020-01-05

## 2020-01-05 MED ORDER — INSULIN LISPRO 100 UNIT/ML ~~LOC~~ SOLN
8.00 | SUBCUTANEOUS | Status: DC
Start: 2020-01-05 — End: 2020-01-05

## 2020-01-05 MED ORDER — FLUTICASONE-UMECLIDIN-VILANT 100-62.5-25 MCG/INH IN AEPB
1.00 | INHALATION_SPRAY | RESPIRATORY_TRACT | Status: DC
Start: 2020-01-09 — End: 2020-01-05

## 2020-01-05 MED ORDER — TRAZODONE HCL 50 MG PO TABS
50.00 | ORAL_TABLET | ORAL | Status: DC
Start: ? — End: 2020-01-05

## 2020-01-05 MED ORDER — INSULIN LISPRO 100 UNIT/ML ~~LOC~~ SOLN
8.00 | SUBCUTANEOUS | Status: DC
Start: 2020-01-06 — End: 2020-01-05

## 2020-01-05 MED ORDER — ESCITALOPRAM OXALATE 10 MG PO TABS
10.00 | ORAL_TABLET | ORAL | Status: DC
Start: 2020-01-09 — End: 2020-01-05

## 2020-01-05 MED ORDER — ACETAMINOPHEN 325 MG PO TABS
975.00 | ORAL_TABLET | ORAL | Status: DC
Start: 2020-01-06 — End: 2020-01-05

## 2020-01-05 MED ORDER — ATORVASTATIN CALCIUM 40 MG PO TABS
40.00 | ORAL_TABLET | ORAL | Status: DC
Start: 2020-01-09 — End: 2020-01-05

## 2020-01-05 MED ORDER — PREDNISONE 20 MG PO TABS
40.00 | ORAL_TABLET | ORAL | Status: DC
Start: 2020-01-05 — End: 2020-01-05

## 2020-01-05 MED ORDER — ALBUTEROL SULFATE HFA 108 (90 BASE) MCG/ACT IN AERS
2.00 | INHALATION_SPRAY | RESPIRATORY_TRACT | Status: DC
Start: ? — End: 2020-01-05

## 2020-01-05 MED ORDER — INSULIN GLARGINE 100 UNIT/ML ~~LOC~~ SOLN
15.00 | SUBCUTANEOUS | Status: DC
Start: 2020-01-05 — End: 2020-01-05

## 2020-01-05 MED ORDER — SENNOSIDES 8.6 MG PO TABS
1.00 | ORAL_TABLET | ORAL | Status: DC
Start: 2020-01-09 — End: 2020-01-05

## 2020-01-05 MED ORDER — ALPRAZOLAM 0.25 MG PO TABS
0.25 | ORAL_TABLET | ORAL | Status: DC
Start: ? — End: 2020-01-05

## 2020-01-05 MED ORDER — INSULIN LISPRO 100 UNIT/ML ~~LOC~~ SOLN
10.00 | SUBCUTANEOUS | Status: DC
Start: 2020-01-05 — End: 2020-01-05

## 2020-01-05 MED ORDER — INSULIN LISPRO 100 UNIT/ML ~~LOC~~ SOLN
0.00 | SUBCUTANEOUS | Status: DC
Start: 2020-01-08 — End: 2020-01-05

## 2020-01-05 MED ORDER — GLUCAGON (RDNA) 1 MG IJ KIT
1.00 | PACK | INTRAMUSCULAR | Status: DC
Start: ? — End: 2020-01-05

## 2020-01-05 NOTE — Telephone Encounter (Signed)
Current admit to [facility] 3/1.

## 2020-01-07 MED ORDER — INSULIN GLARGINE 100 UNIT/ML ~~LOC~~ SOLN
10.00 | SUBCUTANEOUS | Status: DC
Start: 2020-01-08 — End: 2020-01-07

## 2020-01-07 MED ORDER — INSULIN LISPRO 100 UNIT/ML ~~LOC~~ SOLN
6.00 | SUBCUTANEOUS | Status: DC
Start: 2020-01-09 — End: 2020-01-07

## 2020-01-07 MED ORDER — SPIRONOLACTONE 25 MG PO TABS
25.00 | ORAL_TABLET | ORAL | Status: DC
Start: 2020-01-09 — End: 2020-01-07

## 2020-01-07 MED ORDER — INSULIN LISPRO 100 UNIT/ML ~~LOC~~ SOLN
6.00 | SUBCUTANEOUS | Status: DC
Start: 2020-01-08 — End: 2020-01-07

## 2020-01-07 MED ORDER — SACUBITRIL-VALSARTAN 49-51 MG PO TABS
1.00 | ORAL_TABLET | ORAL | Status: DC
Start: 2020-01-08 — End: 2020-01-07

## 2020-01-07 MED ORDER — LIDOCAINE HCL 1 % IJ SOLN
3.00 | INTRAMUSCULAR | Status: DC
Start: ? — End: 2020-01-07

## 2020-01-08 ENCOUNTER — Other Ambulatory Visit: Payer: Self-pay | Admitting: *Deleted

## 2020-01-08 DIAGNOSIS — I5021 Acute systolic (congestive) heart failure: Secondary | ICD-10-CM | POA: Diagnosis not present

## 2020-01-08 NOTE — Patient Outreach (Signed)
[facility]) Care Management [facility] CM Multidisciplinary case conference 01/08/2020  Ashley [name] 01/15/1951 [id number]  [facility] CM Multidisciplinary case conference IE:[id number] in blue text  Ashley [name], 69 y/o femaleinitially referred to [place] by [facility] Liaison RN CM after hospitalization July 21-25, 2041for acute on chronic CHF exacerbation.Patientwas discharged from hospital to home/ self-care without home health services in place. Patient has history including, but not limited to, combined CHF; CAD with previous MI/ CABG, and ICD; HTN/ HLD; GERD; COPD;history oftobacco use- patient has quit smoking since June 19, 2019.  Unfortunately, patient experiencedmultiplerecenthospital readmissions, most of which are less than 30 days from prior hospital discharges: 1)August 13-15, 2048for acute respiratory failure with hypoxia/18 days after her hospital discharge, ; patient was again discharged home without home health services in place. 2) September 9-12, 2020 for acute pulmonary edema; respiratory failure with hypoxia; and CHF exacerbation/ 25 days after previous hospital discharge; was again discharged home without home health services in place, as patient continually refuses need for home health. 3) ED visit 08/02/2019 for chest pain; patient was also diagnosed with ETOH intoxication and discharged homeafter being ruled out for ACS/ MI 4)October 6-9,2020,24 days after her last hospital discharge, again for COPD/ CHF exacerbation. 5) October 18-21, 2020, 9 days after last hospital discharge; Acute Respiratory failure with flash pulmonary edema, secondary to CHF exacerbation/ severe MS/ MR: patient had cardiac catherization during this hospital admission and was determined to have significant 3-vessel CAD and EF of 20-25% 6) November 16-18, 2020(26 days afterlast hospital discharge), again with acute respiratory failure/ CHF  exacerbation. 7) December 9-10, 2020: (21 days post- last hospital discharge)- again with CHF exacerbation; [facility] RN CM contacted patient on morning prior to ED visit- patient had acute episode SOB at home during early morning hours and followed CHF action plan; episode resolved at home and [facility] RN CM and patient contacted cardiology provider/ pulmonary providers; pulmonary provider confirmed that patient's primary issues revolve around her cardiac issues/ need for definitive intervention; recommended that patient be evaluated at hospital given her history/ frequent exacerbations.  Inpatient admission was planned, however, patient was kept in ED, stabilized and discharged home after overnight visit/ intervention. 8) January 12-13, 2021: (33 days post- last hospital discharge): Acute Respiratory failure due to CHF exacerbation  9)  February 11-14, 2021: (29 days post- last hospital discharge): Acute Respiratory failure due to CHF exacerbation  10) February 24-25, 2021: (10 days post last hospital discharge): Acute Respiratory failure due to CHF exacerbation  With each of her last 3 hospitalizations, patient has been discharged home from hospital to self-care without home health services in place; discharge instructions have advised patient to follow up as scheduled with [facility] cardiothoracic team; unfortunately patient has missed many of her scheduled visits at [facility], due to being hospitalized.  01/08/20: [facility] CM Team Discussion/ plan:  Patient was somehow seen by [place] [facility] team after her last discharge, on 01/02/20: patient was admitted to [place] visit, for evaluation of possible need for surgical MVR revision vs. LVAD placement vs. Heart transplant.  As of today, 01/08/20, patient remains admitted at [facility] with pending discharge plans to return to her home with continuous milrinone infusion as she continues awaiting definitive treatment.  Confirmed that [facility]  transplant team is involved and will have transplant RN CM working with patient.  PICC line has been placed and patient will have home health infusion team visiting her post-hospital discharge.  Plan:  --  will continue to follow through EMR for discharge date and will outreach patient accordingly -- will confirm/ outreach [facility] transplant RN CM for care coordination around ongoing plan  -- will re-present to [place] Multidisciplinary team conference as indicated  [name], RN, BSN, [place] Coordinator [facility] Care Management  [phone]  ----------------------------------------------------------------------------------------------------------------------------------------------------- Copied from previous [place] mulitdisciplinary case conferences:  10/23/19:   Multiple care coordination outreaches placed at time of patient most recent exacerbation; confirmed that [place] cardiac MRI unable to complete MRI due to presence of patient's ICD/ pacemaker; confirmed that [facility] unable to scheduled sooner visit for MRI; still planned for 11/03/2019.  Confirmed with patient that she plans to attend as scheduled and has 2  plan options for transportation to [facility] through family members.  Peer to peer follow up completed; cardiology continues to maintain that cardiac MRI necessary before definitive intervention can be planned.  -- Will continue closely following patient and care coordination efforts as indicated -- will re-present to case discussion as indicated  10/10/2019:  Patient frustration that no definitive intervention is completed while she is inpatient/ hospitalized; significant delay in scheduling recommended cardiac MRI at [facility]; [facility] has facilitated getting cardiac MRI scheduled by coordinating with cardiology provider team, however, it is not scheduled until the end of December; looking into option of having test completed at [facility] Patient fearful  that she will re-admit prior to having the MRI done on 11/03/2019 at [facility]  -- Will continue care coordination efforts to explore option of having cardiac MRI scheduled at [facility] rather than [facility] -- Will submit case for MD peer-peer intervention/ follow up  From 10/16 conference: -- patient to attend cardiology office visit this afternoon, 08/22/2019 for follow up and discussion/ development of plan for possible need for MVR revision/ replacement -- patient maintains that she has continued NOT smoking and only consumes alcohol socially; continues to endorse that her family members that do smoke are nolonger smoking inside the home -- patient frustrated around cardiology notes from most recent hospitalization documenting that she is still smoking- patient adamant that she has been quit since June 19, 2019  -- patient frustrated that when she is at hospital there has not been discussion around transferring her to [facility] as inpatient (as per discussion patient had with cardiology during last hospital visit) and fixing the MV, rather than discharging her home to follow up as outpatient; after her in-patient ECHO done in July patient felt that the potential issue around the need for MVR/ revision was not discussed with her as a priority need.  Plan:[facility] CM team to continue to follow patient closely and follow up on cardiology office visit today and PCP office visit on Monday 08/25/2019  From 07/04/2019 case conference:  -- continue efforts to encourage patient to remain smoke free: collaborate with providers/ new pulmonary referral (pending): patient attended pulmonary referral provider office visit on 07/24/2019; pulmonary provider agreed that patient's recent issues with respiratory status may likely be related more to need for revision of MVR; PFT's pending- appointment -- engage patient's fiancee/ family in efforts to partner with patient and also quit smoking-- follow up patient/ family on  efforts -- explore nutritional habits/ practices more thoroughly with ongoing outreaches -- engage patient's PCP in efforts to discern reason for multiple recent hospitalizations -- encourage/ facilitate as indicated patient to promptly schedule hospital discharge cardiology appointment for evaluation of need for MVR revision --continue to follow closely;resubmit to [facility] case  discussionin 3 weeks for update, unless indicated sooner  Barriers:  From 10/23/19 conference:  -- frequent exacerbations in patient needing definitive cardiac intervention, with significant delays in recommended cardiac MRI (currently still scheduled 11/03/19); concern that patient will exacerbate and therefore miss her scheduled appointment; have confirmed with patient on multiple occasions that she has reliable transportation, planned options for unexpected issues that could occur with transportation, and definite plans to attend cardiac MRI as scheduled at [facility]  From 10/10/19 conference:  -- lack of trust in health care team related to no definitive intervention taken to address need for cardiac surgery while she is hospitalized; frustrated that she is discharged home with each of her multiple admissions, despite being told at hospital she needs cardiac surgery.  From 8/28/2020and 9/25/2020case conference:  -- Extensive clinical history: cardiac and respiratory; overall fragile with occasional- intermittent episodes of extreme overnight weight gain > 3 lbs -- Ongoing smoking-- patient reports has recently quit over last 2 weeks after last hospital discharge:patient endorses that she quit smoking on June 19, 2019 at time of her hospital admission -- Need for [place] involved/ addressing: medications now optimized; [name] continues following for ongoing management of patient medication needs -- questionable issues around patent's perception of health status; may  not be based in reality; patient not proactive in addressing acute issues; has not followed up on requests for follow up needed by her to address her medication concerns; ongoing uncertainty around patient's perception/ motivation of self- management/ control her clinical condition -- good family support overall; family members have now taken their own smoking outside to help patient remain smoke free -- patient reported stress around her fiancee's health/ recent hospitalizations, although she is noted to be on multiple medications for stress/ anxiety control -- updated ECHO performed during most recent hospital admission, with EF noted 45% (decreased from 55-60% in 2017)- inpatient medical team suspects possibility that 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

## 2020-01-09 ENCOUNTER — Encounter: Payer: Self-pay | Admitting: *Deleted

## 2020-01-09 ENCOUNTER — Other Ambulatory Visit: Payer: Self-pay | Admitting: *Deleted

## 2020-01-09 ENCOUNTER — Telehealth: Payer: Self-pay

## 2020-01-09 NOTE — Patient Outreach (Signed)
[facility]) [place] Telephone Outreach PCP office completes Transition of Care follow up post-hospital discharge Post- (most recent) hospital discharge day # 1  01/09/2020  Ashley [name] 11/30/1950 [id number]  Successful telephone outreach to Ashley [name], 69 y/o femaleoriginallyreferred to [place] by [facility] Liaison RN CM afterhospitalization July 21-25, 2063fr acute on chronic CHF exacerbation.Patient has history including, but not limited to, combined CHF; CAD with previous MI/ CABG, and ICD; HTN/ HLD; GERD; COPD;history oftobacco use-patient has quit smoking since June 19, 2019.  Patient has multiple hospital re-admissions over the last year, most within 30 days of one another; all related to acute respiratory failure due to CHF exacerbation.  Most recent hospitalizations include:  January 12-13, 2021 (33 days post-previous hospital discharge),again with acute respiratory failure secondary to CHF exacerbation.Patient was discharged home to self-care without home health services in place  February 11-14, 2021 (29 days post last hospital discharge): Acute on Chronic CHF/ hypoxic respiratory failure; during hospitalization, patient had follow up ECHOcardiogram, which again indicated EF of 20-25%; patient was again discharged home to self-care without home health services in place  February 24-25, 2021 (10 days post-last hospital discharge): ongoing CHF exacerbation with hypoxia; discharged home to self-care without home health services in place  January 02, 2020[facility] after attending scheduled cardiothoracic surgeon provider office visit; patient was evaluated for LVAD vs. Heart Transplant vs. MVR revision; patient was discharged home to self-care on continuous milrinone gtt infusion via PICC line with home health services for infusion  RN.  Extensive [facility] from yesterday was completed prior to contacting patient this morning.  Today, patient reports "doing so much better" after hospital discharge yesterday; reports that she was ruled out for need of heart transplant and LVAD; reports that she was discharged home on continuous milrinone infusion after having had trial of medication while hospitalized; accurately reports that [facility] (from 25%- 35%) and current plan is to maintain post-discharge with continuous infusion, indefinitely.  Accurately verbalizes [facility] M[facility] revision, but might not, depending on patient responds to outpatient continuous milrinone therapy.   Patient states she "feels better than (she) has in a long time," now that she has milrinone therapy has been initiated.  She denies pain and new/ recent falls and sounds to be in no distress throughout phone call today-- in fact, patient sounds GREAT today! She verbalizes excellent confidence today, stating, "I got this;" positive reinforcement provided throughout call.  Patient further reports:  -- has and is taking all medications post-[facility] hospital discharge: patient able to accurately verbalize all medication changes without prompting and confirms that she has obtained/ started all new medications as instructed; denies medication concerns.  [facility] were all added to EMR, no concerns identified.  Made patient aware that [facility] [name] will be contacting her next week, encouraged patient to complete medication review in entirety at time of [place], patient is agreeable.  Patient continues to verbalize a very good general understanding of purpose/ dosing/ scheduling of current medications  -- post- hospital discharge provider appointments  reviewed:  Patient verbalizes accurate understanding of all and plans to attend all; confirms that she has ongoing reliable transportation to all provider appointments ---- [facility] post-hospital discharge follow up/ evaluation of milrinone therapy; confirmed that patient has contact information for [facility] providers [facility]; plans in future to arrange sleep study/ need for home CPAP, based on patient's presentation with previous admissions- all relived by [employer] once hospitalized ---- February 18, 2020: local cardiology provider/ Dr. Saralyn Pilar  -- home health services now in place through [facility] [phone] for infusion RN; patient confirms that she has been contacted by home health nurse; visit to patient's home scheduled for tomorrow ---- coached patient around talking points/ needed education for new milrinone gtt infusion ---- encouraged patient's ongoing active participation with home health services  -- Confirmed that patient continues to deny community resource needs:  ---- SDOH updated for: depression/ transportation/ food insecurity: no concerns/ needs identified  -- ongoing excellent understanding of self-health management of chronic disease state of CHF: patient continues able to independently verbalize home self-health management strategies and action plan for CHF without prompting ---- continues daily weight monitoring and recording at home; continues to report daily weight this morning at baseline, "166 lbs" ---- continues following low salt heart healthy diet ---- no peripheral swelling ---- confirms that general plan for concerns/ issues/ problems that arise is to  1) follow established CHF action plan 2) contact [place] HF providers promptly and early for any concerns/ changes 3) report to the local ED immediately should she need emergent care, with transfer to Operating Room Services should admission be required  ---- briefly discussed with patient  newly added metformin therapy; reviewed with her last A1-C from 01/01/20 (6.7) (after multiple episodes of prednisone therapy)  Patient denies further issues, concerns, or problems today. I confirmed that patient hasmy direct phone number, the main [facility] CM office phone number, and the [facility] CM 24-hour nurse advice phone number should issues arise prior to next scheduled [place] outreach next week.  Encouraged patient to contact me directly if needs, questions, issues, or concerns arise prior to next scheduled outreach; patient agreed to do so.  Plan:  Patient will take medications as prescribed and will attend all scheduled provider appointments  Patient will promptly notify care providers for any new concerns/ issues/ problems that arise  Patient will continue monitoring/ recording daily weightsand following action plan for CHF zones  Patient will actively participate in home health RN infusion services as ordered post- hospital discharge  [place] outreach to continue with scheduled phone callnext week, post- upcoming scheduled provider office visits  [facility] CM Care Plan Problem One     Most Recent Value  Care Plan Problem One  High risk for hospital readmission related to/ as evidenced by multiple recent hospitalizations for Acute Respiratory Failure/ CHF exacerbation with most recent hospital discharge on 12/20/2018 [Goal re-established after hospitalizations]  Role Documenting the Problem One  Care Management Coordinator  Care Plan for Problem One  Active  [facility] Long Term Goal   Over the next 31 days, patient will not experience unplanned hospital readmission, as evidenced by patient reporting and review of EMR during [facility] RN CCM outreach [goal modified/ extended today]  [facility] Long Term Goal Start Date  01/09/20 [Goal re-established today]  Interventions for Problem One Long Term Goal  Discussed with patient her recent hospializations, both at [facility] and at [facility],  discussed  clinical condition and confirmed that she has no current clinical concerns,  Reviewed all (most recent) hospital discharge instructions and medication changes with patient and confirmed that she is able to accurately verbalize  all medication changes with patient,  updated SDOH for depression/ transportation/ food insecurity- no concerns identified,  discussed and reviewed overall plan of care with patient post- recent hospital discharge at [place] #1   Over the next 30 days, patient will actively participate in home health services for continuous milrinone infusion management/ lab work, as evidenced by patient reporting and collaboration with home health team as indicated during [place] outreach  [facility] CM Short Term Goal #1 Start Date  01/09/20  Interventions for Short Term Goal #1  Confirmed that patient has been contacted by home health infusion RN and has contact information for home health agency,  encouraged patient to actively engage with home health team and discussed talking points for patient/ home health team around education and self-management of newly prescribed continuous infusion  [facility] CM Short Term Goal #2   Over the next 30 days, patient will attend all scheduled provider appointments, as evidenced by patient reporting during [facility] RN CM outreach  [facility] CM Short Term Goal #2 Start Date  01/09/20  Interventions for Short Term Goal #2  Reviewed with patient all upcoming scheduled provider appointments and confirmed that she has accurate understanding of all and plans to attend,  re- confirmed that patient does not have transportation issues    [facility] CM Care Plan Problem Two     Most Recent Value  Care Plan Problem Two  Need for ongoing reinforcement of self-health management strategies for chronic disease state of CHF/ COPD, as evidenced by patient reporting and recent hospitalization for CHF exacerbation  Role Documenting the Problem Two  Care Management Coordinator  Care  Plan for Problem Two  Not Active  [facility] CM Short Term Goal #2   Over the next 30 days, patient will verbalize plan to obtain needed intervention for MVR revision as evidenced by patient reporting during [facility] RN CM outreach  [facility] CM Short Term Goal #2 Start Date  12/23/19  [facility] CM Short Term Goal #2 Met Date  01/09/20- Goal Met  Interventions for Short Term Goal #2  Discussed overall plan of care post- recent admission to [facility],  confirmed that patient has good understanding of plan of care moving forward     [name], RN, BSN, [employer] Coordinator [facility] Care Management  [phone]

## 2020-01-09 NOTE — Telephone Encounter (Signed)
Patient states she is doing well since returning home and very excited about no longer needing a transplant. She plans to follow up with cardiology and will not require a hospital follow up with PMD at this time. Encouraged patient to call the office if there is anything we can do to assist at anytime.

## 2020-01-10 DIAGNOSIS — I5021 Acute systolic (congestive) heart failure: Secondary | ICD-10-CM | POA: Diagnosis not present

## 2020-01-12 ENCOUNTER — Ambulatory Visit (INDEPENDENT_AMBULATORY_CARE_PROVIDER_SITE_OTHER): Payer: Medicare Other | Admitting: Pharmacist

## 2020-01-12 DIAGNOSIS — M109 Gout, unspecified: Secondary | ICD-10-CM

## 2020-01-12 DIAGNOSIS — I5032 Chronic diastolic (congestive) heart failure: Secondary | ICD-10-CM | POA: Diagnosis not present

## 2020-01-12 DIAGNOSIS — R739 Hyperglycemia, unspecified: Secondary | ICD-10-CM

## 2020-01-12 DIAGNOSIS — I10 Essential (primary) hypertension: Secondary | ICD-10-CM | POA: Diagnosis not present

## 2020-01-12 DIAGNOSIS — I25700 Atherosclerosis of coronary artery bypass graft(s), unspecified, with unstable angina pectoris: Secondary | ICD-10-CM | POA: Diagnosis not present

## 2020-01-12 DIAGNOSIS — T50905A Adverse effect of unspecified drugs, medicaments and biological substances, initial encounter: Secondary | ICD-10-CM

## 2020-01-12 NOTE — Chronic Care Management (AMB) (Addendum)
Chronic Care Management   Follow Up Note   01/12/2020 Name: Ashley [name] MRN: [id number] DOB: [birth year]  Referred by: [name], MD Reason for referral : Chronic Care Management (Medication Management)   Ashley [name] is a 69 y.o. year old female who is a primary care patient of [name], [name], MD. The CCM team was consulted for assistance with chronic disease management and care coordination needs.    Contacted patient for medication management review.   Review of patient status, including review of consultants reports, relevant laboratory and other test results, and collaboration with appropriate care team members and the patient's provider was performed as part of comprehensive patient evaluation and provision of chronic care management services.    SDOH (Social Determinants of Health) assessments performed: No See Care Plan activities for detailed interventions related to [facility])     Outpatient Encounter Medications as of 01/12/2020  Medication Sig Note  . acetaminophen (TYLENOL) 325 MG tablet Take 650 mg by mouth every 6 (six) hours as needed.   [name] albuterol (VENTOLIN HFA) 108 (90 Base) MCG/ACT inhaler INHALE 2 PUFFS BY MOUTH INTO THE LUNGS EVERY 3 HOURS AS NEEDED FOR WHEEZING   . ALPRAZolam (XANAX) 0.5 MG tablet Take 0.5-1 tablets (0.25-0.5 mg total) by mouth at bedtime. TAKE ONE TABLET BY MOUTH AT BEDTIME AS NEEDED FOR ANXIETY (Patient taking differently: Take 0.25-0.5 mg by mouth at bedtime. ) 01/12/2020: Taking 1 QPM  . aspirin 81 MG tablet Take 81 mg by mouth daily.   [name] atorvastatin (LIPITOR) 40 MG tablet TAKE ONE TABLET BY MOUTH ONCE DAILY   . clopidogrel (PLAVIX) 75 MG tablet Take 1 tablet (75 mg total) by mouth daily.   . cyanocobalamin (,VITAMIN B-12,) 1000 MCG/ML injection Inject 1 mL into the muscle monthly (Patient taking differently: Inject 1,000 mcg into the muscle every 30 (thirty) days. )   . diphenhydrAMINE (DIPHENHIST) 25 mg capsule Take 25 mg by mouth at  bedtime as needed for allergies or sleep.    [name] escitalopram (LEXAPRO) 10 MG tablet Take 1 tablet (10 mg total) by mouth daily.   . ferrous sulfate 325 (65 FE) MG EC tablet Take 325 mg by mouth daily with breakfast.   . Fluticasone-Umeclidin-Vilant (TRELEGY ELLIPTA) 100-62.5-25 MCG/INH AEPB Inhale 1 puff into the lungs daily.   [name] ipratropium-albuterol (DUONEB) 0.5-2.5 (3) MG/3ML SOLN Take 3 mLs by nebulization every 6 (six) hours as needed. 01/12/2020: Using 1 QPM  . metFORMIN (GLUCOPHAGE-XR) 500 MG 24 hr tablet Take 1,000 mg by mouth every morning.   . milrinone (PRIMACOR) 20 MG/100 ML SOLN infusion Inject 0.25 mcg/kg/min into the vein continuous. 01/09/2020: Added after [facility] HF admission 01/08/20  . omeprazole (PRILOSEC) 40 MG capsule Take 1 capsule (40 mg total) by mouth daily.   . potassium chloride (KLOR-CON) 10 MEQ tablet Take 2 tablets (20 mEq total) by mouth daily.   . sacubitril-valsartan (ENTRESTO) 49-51 MG Take 1 tablet by mouth in the morning and at bedtime.   [name] spironolactone (ALDACTONE) 25 MG tablet Take 25 mg by mouth daily.   [name] torsemide (DEMADEX) 20 MG tablet Take 40 mg by mouth daily.   . traZODone (DESYREL) 50 MG tablet Take 0.5-1 tablets (25-50 mg total) by mouth daily. One hour before bedtime (Patient taking differently: Take 50 mg by mouth daily. One hour before bedtime)   . febuxostat (ULORIC) 40 MG tablet Take 1 tablet (40 mg total) by mouth daily. (Patient not taking: Reported on 01/12/2020)   .  nitroGLYCERIN (NITROSTAT) 0.4 MG SL tablet Place 1 tablet (0.4 mg total) under the tongue every 5 (five) minutes as needed for chest pain. (Patient not taking: Reported on 01/09/2020) 01/09/2020: Reports discontinued 01/08/20 by HF provider at [facility]  . [DISCONTINUED] carvedilol (COREG) 6.25 MG tablet Take 1 tablet (6.25 mg total) by mouth 2 (two) times daily with a meal. (Patient not taking: Reported on 01/09/2020) 01/09/2020: Reports discontinued 01/08/20 at [facility] by cardiothoracic HF team  .  [DISCONTINUED] furosemide (LASIX) 20 MG tablet Take 3 tablets (60 mg total) by mouth 2 (two) times daily. (Patient not taking: Reported on 01/09/2020) 01/09/2020: Reports discontinued 01/08/20 by heart failure providers at [facility]  . [DISCONTINUED] metFORMIN (GLUMETZA) 1000 MG (MOD) 24 hr tablet Take 1,000 mg by mouth daily with breakfast. 01/09/2020: Added after [facility] HF admission 01/08/20  . [DISCONTINUED] sacubitril-valsartan (ENTRESTO) 24-26 MG Take 1 tablet by mouth 2 (two) times daily. (Patient not taking: Reported on 01/09/2020) 01/09/2020: Reports not taking- dose increased 01/08/20 by HF provider at [facility]  . [DISCONTINUED] sacubitril-valsartan (ENTRESTO) 49-51 MG Take 1 tablet by mouth 2 (two) times daily. 01/09/2020: Reports dose increased after [facility] HF admission 01/08/20  . [DISCONTINUED] spironolactone (ALDACTONE) 25 MG tablet Take 0.5 tablets (12.5 mg total) by mouth daily. (Patient not taking: Reported on 01/09/2020) 01/09/2020: Reports discontinued 01/08/20 by HF provider at [facility]   No facility-administered encounter medications on file as of 01/12/2020.     Objective:   Goals Addressed            This Visit's Progress     Patient Stated   . "I want to stay out of the hospital" (pt-stated)       [place] (see longtitudinal plan of care for additional care plan information)  Current Barriers:  . Polypharmacy; complex patient with multiple comorbidities including HFrEF, COPD, CAD (hx NSTEMI, s/p CABG x3 and mitral valve repair in 2007), continued severe mitral valve regurgitation; barrett's esophagus, depression . Most recent admission at [facility] 2/26-01/08/20. Discussion of LVAD vs heart transplant, determined not to be a candidate for either. Discharged on home milrenone. Notes that she is supposed to have a [place] RN come today around 10 am, but nobody has come yet. She called the agency at 12:30 pm, and they said someone would be calling her. Got off the phone with me today because she was getting another  call. Notes that she has questions regarding pump. Notes that the alarm won't go off when bag is empty, though she was told it would. Was also told that bag should last ~48 hours, but seems to be lasting longer. . Today, reports feeing "better than I have in a long time". Able to verbally review all medication changes made at discharge:  o D/c carvedilol o Increase spironolactone to 25 mg daily, increase Entresto to 49/51 mg BID o Switch furosemide to torsemide 40 mg QAM . Self-manages medications by filling a BID pill box. Has Medicare Extra Help- all brand medications $9.20 and generic $3.70 for 30 or 90 day supply o COPD: Tx Trelegy daily + albuterol HFA PRN or Duonebs PRN; follows w/ Dr. Patsey Berthold;  o CHF: HFrEF/CAD/mitral valve; managed on Entresto 24/26 BID, spironolactone 25 mg BID, Entresto 49/51 mg BID, torsemide 40 mg daily follows w/ Dr. Saralyn Pilar; milrinone drip HF clinic, [facility] surgery o Gout: last uric acid 10.3; started on Uloric 40 mg daily; however, now reports that her insurance will not cover Uloric, require allopurinol first.  o Insomnia:  notes taking trazodone 50 mg QPM, alprazolam 0.25 mg QPM, and diphenhydramine 25 mg QPM for sleep.  o Hx tobacco abuse: patient has been TOBACCO FREE since 06/19/2019. [name]   Pharmacist Clinical Goal(s):  [name] Over the next 90 days, patient will work with PharmD and provider towards optimized medication management  Interventions: . Comprehensive medication review performed, medication list updated in electronic medical record . Patient able to verbalize medication changes accurately. . Regarding milrenone pump questions - encouraged patient to let me know if she didn't hear from [facility] RN, but to go ahead and call them back to confirm that someone is coming out to her today. Encouraged to ask about pump alarms and exactly how long 1 bag should last. Will also update [facility] CM RN for any troubleshooting suggestions.  . Noted that Uloric is not covered w/o  prior trial of allopurinol. With most recent eGFR of 47, recommend allopurinol 50 mg daily. Given renal function and concurrent loop diuretic, a slower titration appropriate. Will discuss w/ PCP Dr. Derrel Nip  Patient Self Care Activities:  . Patient will take medications as prescribed . Patient will attend follow up appointments as scheduled.   Please see past updates related to this goal by clicking on the "Past Updates" button in the selected goal          Plan:  - Scheduled f/u call 02/19/20  [name] Darnelle Maffucci, PharmD, [place], CPP Clinical Pharmacist [facility] [phone]    I have reviewed the above information and agree with above.  Appreciate Pharm D advice;  Allopurinol rx sent and follow up labs ordered   Deborra Medina, MD

## 2020-01-12 NOTE — Patient Instructions (Signed)
Visit Information  Goals Addressed            This Visit's Progress     Patient Stated   . "I want to stay out of the hospital" (pt-stated)       Ashley [name] (see longtitudinal plan of care for additional care plan information)  Current Barriers:  . Polypharmacy; complex patient with multiple comorbidities including HFrEF, COPD, CAD (hx NSTEMI, s/p CABG x3 and mitral valve repair in 2007), continued severe mitral valve regurgitation; barrett's esophagus, depression . Most recent admission at [facility] 2/26-01/08/20. Discussion of LVAD vs heart transplant, determined not to be a candidate for either. Discharged on home milrenone. Notes that she is supposed to have a [place] RN come today around 10 am, but nobody has come yet. She called the agency at 12:30 pm, and they said someone would be calling her. Got off the phone with me today because she was getting another call. Notes that she has questions regarding pump. Notes that the alarm won't go off when bag is empty, though she was told it would. Was also told that bag should last ~48 hours, but seems to be lasting longer. . Today, reports feeing "better than I have in a long time". Able to verbally review all medication changes made at discharge:  o D/c carvedilol o Increase spironolactone to 25 mg daily, increase Entresto to 49/51 mg BID o Switch furosemide to torsemide 40 mg QAM . Self-manages medications by filling a BID pill box. Has Medicare Extra Help- all brand medications $9.20 and generic $3.70 for 30 or 90 day supply o COPD: Tx Trelegy daily + albuterol HFA PRN or Duonebs PRN; follows w/ Dr. Patsey Berthold;  o CHF: HFrEF/CAD/mitral valve; managed on Entresto 24/26 BID, spironolactone 25 mg BID, Entresto 49/51 mg BID, torsemide 40 mg daily follows w/ Dr. Saralyn Pilar; milrinone drip HF clinic, [facility] surgery o Gout: last uric acid 10.3; started on Uloric 40 mg daily; however, now reports that her insurance will not cover Uloric, require allopurinol  first.  o Insomnia: notes taking trazodone 50 mg QPM, alprazolam 0.25 mg QPM, and diphenhydramine 25 mg QPM for sleep.  o Hx tobacco abuse: patient has been TOBACCO FREE since 06/19/2019. [name]   Pharmacist Clinical Goal(s):  [name] Over the next 90 days, patient will work with PharmD and provider towards optimized medication management  Interventions: . Comprehensive medication review performed, medication list updated in electronic medical record . Patient able to verbalize medication changes accurately. . Regarding milrenone pump questions - encouraged patient to let me know if she didn't hear from [facility] RN, but to go ahead and call them back to confirm that someone is coming out to her today. Encouraged to ask about pump alarms and exactly how long 1 bag should last. Will also update [facility] CM RN for any troubleshooting suggestions.  . Noted that Uloric is not covered w/o prior trial of allopurinol. With most recent eGFR of 47, recommend allopurinol 50 mg daily. Given renal function and concurrent loop diuretic, a slower titration appropriate. Would stay at 50 mg daily for at least 2-3 weeks, consider rechecking uric acid. Will discuss w/ PCP Dr. Derrel Nip  Patient Self Care Activities:  . Patient will take medications as prescribed . Patient will attend follow up appointments as scheduled.   Please see past updates related to this goal by clicking on the "Past Updates" button in the selected goal         Patient verbalizes understanding of  instructions provided today.   Plan:  - Scheduled f/u call 02/19/20  [name] Darnelle Maffucci, PharmD, BCACP, CPP Clinical Pharmacist [place] [phone]

## 2020-01-13 ENCOUNTER — Ambulatory Visit: Payer: Self-pay | Admitting: Pharmacist

## 2020-01-13 DIAGNOSIS — I25700 Atherosclerosis of coronary artery bypass graft(s), unspecified, with unstable angina pectoris: Secondary | ICD-10-CM

## 2020-01-13 DIAGNOSIS — I10 Essential (primary) hypertension: Secondary | ICD-10-CM

## 2020-01-13 DIAGNOSIS — M109 Gout, unspecified: Secondary | ICD-10-CM

## 2020-01-13 DIAGNOSIS — I5032 Chronic diastolic (congestive) heart failure: Secondary | ICD-10-CM

## 2020-01-13 MED ORDER — ALLOPURINOL 100 MG PO TABS: 50.0000 mg | ORAL_TABLET | Freq: Every day | ORAL | 5 refills | Status: DC

## 2020-01-13 NOTE — Patient Instructions (Signed)
Visit Information  Goals Addressed            This Visit's Progress     Patient Stated   . "I want to stay out of the hospital" (pt-stated)       Ashley [name] (see longtitudinal plan of care for additional care plan information)  Current Barriers:  . Polypharmacy; complex patient with multiple comorbidities including HFrEF, COPD, CAD (hx NSTEMI, s/p CABG x3 and mitral valve repair in 2007), continued severe mitral valve regurgitation; barrett's esophagus, depression . Most recent admission at [facility] 2/26-01/08/20. Discussion of LVAD vs heart transplant, determined not to be a candidate for either. Discharged on home milrenone.  . Self-manages medications by filling a BID pill box. Has Medicare Extra Help- all brand medications $9.20 and generic $3.70 for 30 or 90 day supply o COPD: Tx Trelegy daily + albuterol HFA PRN or Duonebs PRN; follows w/ Dr. Patsey Berthold;  o CHF: HFrEF/CAD/mitral valve; managed on Entresto 24/26 BID, spironolactone 25 mg BID, Entresto 49/51 mg BID, torsemide 40 mg daily follows w/ Dr. Saralyn Pilar; milrinone drip HF clinic, [facility] surgery o Gout: last uric acid 10.3; insurance would not pay for Uloric so discussed w/ Dr Derrel Nip; starting allopurinol 50 mg daily.  o Insomnia: notes taking trazodone 50 mg QPM, alprazolam 0.25 mg QPM, and diphenhydramine 25 mg QPM for sleep.  o Hx tobacco abuse: patient has been TOBACCO FREE since 06/19/2019. [name]   Pharmacist Clinical Goal(s):  [name] Over the next 90 days, patient will work with PharmD and provider towards optimized medication management  Interventions: . Contacted patient. Counseled that we are starting allopurinol 50 mg daily. Counseled that if any sign of skin rash to immediately d/c medication and contact the office. Patient verbalized understanding. Will collaborate w/ office staff to schedule patient for lab work in 3-4 weeks for uric acid and BMP. At that time, if serum uric acid still >6.5, recommend increasing allopurinol to  100 mg daily. Would not titrate any faster than Q2-4 weeks and at dose intervals >100 mg.   Patient Self Care Activities:  . Patient will take medications as prescribed . Patient will attend follow up appointments as scheduled.   Please see past updates related to this goal by clicking on the "Past Updates" button in the selected goal         Print copy of patient instructions provided.   Plan:  - Will outreach patient as previously scheduled  [name] Darnelle Maffucci, PharmD, [place], [place] Pharmacist [place] [phone]

## 2020-01-13 NOTE — Addendum Note (Signed)
Addended by: [name] on: 01/13/2020 12:37 PM   Modules accepted: Orders

## 2020-01-13 NOTE — Addendum Note (Signed)
Addended by: [name] on: 01/13/2020 12:36 PM   Modules accepted: Orders

## 2020-01-13 NOTE — Chronic Care Management (AMB) (Signed)
Chronic Care Management   Follow Up Note   01/13/2020 Name: Ashley [name] MRN: [id number] DOB: Jul 18, 1951  Referred by: [name], MD Reason for referral : Chronic Care Management (Medication Management)   Ashley [name] is a 69 y.o. year old female who is a primary care patient of [name], [name], MD. The CCM team was consulted for assistance with chronic disease management and care coordination needs.    Contacted patient for medication counseling.  Review of patient status, including review of consultants reports, relevant laboratory and other test results, and collaboration with appropriate care team members and the patient's provider was performed as part of comprehensive patient evaluation and provision of chronic care management services.    SDOH (Social Determinants of Health) assessments performed: No See Care Plan activities for detailed interventions related to [facility])     Outpatient Encounter Medications as of 01/13/2020  Medication Sig Note  . acetaminophen (TYLENOL) 325 MG tablet Take 650 mg by mouth every 6 (six) hours as needed.   [name] albuterol (VENTOLIN HFA) 108 (90 Base) MCG/ACT inhaler INHALE 2 PUFFS BY MOUTH INTO THE LUNGS EVERY 3 HOURS AS NEEDED FOR WHEEZING   . allopurinol (ZYLOPRIM) 100 MG tablet Take 0.5 tablets (50 mg total) by mouth daily.   [name] ALPRAZolam (XANAX) 0.5 MG tablet Take 0.5-1 tablets (0.25-0.5 mg total) by mouth at bedtime. TAKE ONE TABLET BY MOUTH AT BEDTIME AS NEEDED FOR ANXIETY (Patient taking differently: Take 0.25-0.5 mg by mouth at bedtime. ) 01/12/2020: Taking 1 QPM  . aspirin 81 MG tablet Take 81 mg by mouth daily.   [name] atorvastatin (LIPITOR) 40 MG tablet TAKE ONE TABLET BY MOUTH ONCE DAILY   . clopidogrel (PLAVIX) 75 MG tablet Take 1 tablet (75 mg total) by mouth daily.   . cyanocobalamin (,VITAMIN B-12,) 1000 MCG/ML injection Inject 1 mL into the muscle monthly (Patient taking differently: Inject 1,000 mcg into the muscle every 30  (thirty) days. )   . diphenhydrAMINE (DIPHENHIST) 25 mg capsule Take 25 mg by mouth at bedtime as needed for allergies or sleep.    [name] escitalopram (LEXAPRO) 10 MG tablet Take 1 tablet (10 mg total) by mouth daily.   . febuxostat (ULORIC) 40 MG tablet Take 1 tablet (40 mg total) by mouth daily. (Patient not taking: Reported on 01/12/2020)   . ferrous sulfate 325 (65 FE) MG EC tablet Take 325 mg by mouth daily with breakfast.   . Fluticasone-Umeclidin-Vilant (TRELEGY ELLIPTA) 100-62.5-25 MCG/INH AEPB Inhale 1 puff into the lungs daily.   [name] ipratropium-albuterol (DUONEB) 0.5-2.5 (3) MG/3ML SOLN Take 3 mLs by nebulization every 6 (six) hours as needed. 01/12/2020: Using 1 QPM  . metFORMIN (GLUCOPHAGE-XR) 500 MG 24 hr tablet Take 1,000 mg by mouth every morning.   . milrinone (PRIMACOR) 20 MG/100 ML SOLN infusion Inject 0.25 mcg/kg/min into the vein continuous. 01/09/2020: Added after [facility] HF admission 01/08/20  . nitroGLYCERIN (NITROSTAT) 0.4 MG SL tablet Place 1 tablet (0.4 mg total) under the tongue every 5 (five) minutes as needed for chest pain. (Patient not taking: Reported on 01/09/2020) 01/09/2020: Reports discontinued 01/08/20 by HF provider at [facility]  . omeprazole (PRILOSEC) 40 MG capsule Take 1 capsule (40 mg total) by mouth daily.   . potassium chloride (KLOR-CON) 10 MEQ tablet Take 2 tablets (20 mEq total) by mouth daily.   . sacubitril-valsartan (ENTRESTO) 49-51 MG Take 1 tablet by mouth in the morning and at bedtime.   [name] spironolactone (ALDACTONE) 25 MG tablet  Take 25 mg by mouth daily.   [name] torsemide (DEMADEX) 20 MG tablet Take 40 mg by mouth daily.   . traZODone (DESYREL) 50 MG tablet Take 0.5-1 tablets (25-50 mg total) by mouth daily. One hour before bedtime (Patient taking differently: Take 50 mg by mouth daily. One hour before bedtime)    No facility-administered encounter medications on file as of 01/13/2020.     Objective:   Goals Addressed            This Visit's Progress     Patient  Stated   . "I want to stay out of the hospital" (pt-stated)       [place] (see longtitudinal plan of care for additional care plan information)  Current Barriers:  . Polypharmacy; complex patient with multiple comorbidities including HFrEF, COPD, CAD (hx NSTEMI, s/p CABG x3 and mitral valve repair in 2007), continued severe mitral valve regurgitation; barrett's esophagus, depression . Most recent admission at [facility] 2/26-01/08/20. Discussion of LVAD vs heart transplant, determined not to be a candidate for either. Discharged on home milrenone.  . Self-manages medications by filling a BID pill box. Has Medicare Extra Help- all brand medications $9.20 and generic $3.70 for 30 or 90 day supply o COPD: Tx Trelegy daily + albuterol HFA PRN or Duonebs PRN; follows w/ Dr. Patsey Berthold;  o CHF: HFrEF/CAD/mitral valve; managed on Entresto 24/26 BID, spironolactone 25 mg BID, Entresto 49/51 mg BID, torsemide 40 mg daily follows w/ Dr. Saralyn Pilar; milrinone drip HF clinic, [facility] surgery o Gout: last uric acid 10.3; insurance would not pay for Uloric so discussed w/ Dr Derrel Nip; starting allopurinol 50 mg daily.  o Insomnia: notes taking trazodone 50 mg QPM, alprazolam 0.25 mg QPM, and diphenhydramine 25 mg QPM for sleep.  o Hx tobacco abuse: patient has been TOBACCO FREE since 06/19/2019. [name]   Pharmacist Clinical Goal(s):  [name] Over the next 90 days, patient will work with PharmD and provider towards optimized medication management  Interventions: . Contacted patient. Counseled that we are starting allopurinol 50 mg daily. Counseled that if any sign of skin rash to immediately d/c medication and contact the office. Patient verbalized understanding. Will collaborate w/ office staff to schedule patient for lab work in 3-4 weeks for uric acid and BMP. At that time, if serum uric acid still >6.5, recommend increasing allopurinol to 100 mg daily. Would not titrate any faster than Q2-4 weeks and at dose intervals >100  mg.   Patient Self Care Activities:  . Patient will take medications as prescribed . Patient will attend follow up appointments as scheduled.   Please see past updates related to this goal by clicking on the "Past Updates" button in the selected goal          Plan:  - Will outreach patient as previously scheduled  [name] Darnelle Maffucci, PharmD, [place], [place] Pharmacist [facility] [phone]

## 2020-01-14 ENCOUNTER — Telehealth: Payer: Self-pay | Admitting: Internal Medicine

## 2020-01-14 DIAGNOSIS — I5021 Acute systolic (congestive) heart failure: Secondary | ICD-10-CM | POA: Diagnosis not present

## 2020-01-14 NOTE — Telephone Encounter (Signed)
Pt needs refill on metFORMIN (GLUCOPHAGE-XR) 500 MG 24 hr tablet for 90 days and ALPRAZolam (XANAX) 0.5 MG tablet

## 2020-01-15 MED ORDER — ALPRAZOLAM 0.5 MG PO TABS: 0.2500 mg | ORAL_TABLET | Freq: Every evening | ORAL | 3 refills | Status: DC | PRN

## 2020-01-15 MED ORDER — METFORMIN HCL ER 500 MG PO TB24: 1000.0000 mg | ORAL_TABLET | Freq: Every morning | ORAL | 1 refills | Status: DC

## 2020-01-15 NOTE — Telephone Encounter (Signed)
Refill request forXanax, last seen 12-25-19, last filled 09-05-19.  Please advise. Metformin has been refilled

## 2020-01-15 NOTE — Telephone Encounter (Signed)
Refilled xanax

## 2020-01-16 ENCOUNTER — Encounter: Payer: Self-pay | Admitting: *Deleted

## 2020-01-16 ENCOUNTER — Other Ambulatory Visit: Payer: Self-pay | Admitting: *Deleted

## 2020-01-16 DIAGNOSIS — I5021 Acute systolic (congestive) heart failure: Secondary | ICD-10-CM | POA: Diagnosis not present

## 2020-01-16 NOTE — Patient Outreach (Signed)
[facility]) Care Management Success Telephone Outreach PCP office completes Transition of Care follow up post-hospital discharge Post- (most recent) hospital discharge day # 8  01/16/2020  Ashley [name] 02/12/1951 [id number]  Successful telephone outreach toDeborah Loyal [name], 69 y/o femaleoriginallyreferred to [place] by [facility] Liaison RN CM afterhospitalization July 21-25, 2041for acute on chronic CHF exacerbation.Patient has history including, but not limited to, combined CHF; CAD with previous MI/ CABG, and ICD; HTN/ HLD; GERD; COPD;history oftobacco use-patient has quit smoking since June 19, 2019.  Patient has multiple hospital re-admissions over the last year, most within 30 days of one another; all related to acute respiratory failure due to CHF exacerbation.  Most recent hospitalizations include:  January 12-13, 2021 (33 days post-previoushospital discharge),again with acute respiratory failure secondary to CHF exacerbation.Patient was discharged home to self-care without home health services in place  February 11-14, 2021 (29 days post last hospital discharge): Acute on Chronic CHF/ hypoxic respiratory failure; during hospitalization, patient had follow up ECHOcardiogram, which again indicated EF of 20-25%; patient was again discharged home to self-care without home health services in place  February 24-25, 2021 (10 days post-last hospital discharge): ongoing CHF exacerbation with hypoxia; discharged home to self-care without home health services in place  January 02, 2020- January 08, 2020 at [facility]: patient was admitted to [facility] heart failure unit/ ICU after attending scheduled cardiothoracic surgeon provider office visit; patient was evaluated for LVAD vs. Heart Transplant vs. MVR revision; patient was discharged home to self-care on continuous milrinone gtt infusion via PICC line with home health services for infusion  RN.  Today, patient continues to report "doing so much better" since last hospital discharge; states she has "had the best week" in a "long time."  She reports ongoing home health visits from RN Infusion team; visits occurring weekly and nurse came this Wednesday 01/14/20 and had no concerns after visiting with patient.  She denies pain, new/ recent falls, and denies clinical concerns/ issues/ problems.  Patient sounds to be in no distress throughout call today.  Patient further reports:  -- has and is taking all medications post-[facility] hospital discharge: denies medication concerns, and she confirms that she [facility] with [name] also involved in her care earlier this week and post-hospital discharge medication review was completed (verified through review of EHR, see [facility] Pharmacist note dated 01/12/20)-- Patient was recently discharged from hospital and all medications have been reviewed. ---- reports "has plenty" of the milrinone infusion: infusion is shipped directly to her home; states the medication has been automatically set up to deliver 4 doses at a time to her home  -- post- hospital discharge provider appointmentsagain reviewed: Patient verbalizes accurate understanding of all and plans to attend all; confirms that she has ongoing reliable transportation through family members to all provider appointments ---- January 21, 2020: return to [facility] heart failure provider for post-hospital discharge follow up/ evaluation of milrinone therapy; confirmed that patient has contact information for care providers at [facility]; plans in future to arrange sleep study/ need for home CPAP, based on patient's presentation with previous admissions- all relived by [employer] once hospitalized ---- February 18, 2020: local cardiology provider/ Dr. Saralyn Pilar  -- home health services now in place through [facility] [phone] for infusion RN; patient confirms that she has been contacted/ visited by home health  nurse; next visit to patient's home scheduled for Wednesday January 21, 2020; reports visits planned weekly ---- encouraged patient's ongoing active  participation with home health services ---- confirmed that she and her family member [name]) have been changing milrinone infusions as instructed and she denies problems around this; states she understands to call home health infusion nurse should problems arise in future- confirms that she has the contact information for home health infusion team  -- ongoing excellent understanding of self-health management of chronic disease state of CHF: patient continues able to independently verbalize home self-health management strategies and action plan for CHF without prompting; using teach back method, re-iterated with patient action plan for development of problems/ concerns/ issues ---- continues daily weight monitoring and recording at home; continues to report daily weight this morning at baseline, with today's weight of "164.4 lbs" ---- has been monitoring blood pressures at home: reports "114/77" this morning, and reports that home health infusion team reviewed all blood pressures/ weights at home when they visited her on Wednesday 01/14/20 ---- continues following low salt heart healthy diet ---- no peripheral swelling ---- confirms that general plan for concerns/ issues/ problems that arise is to  1) follow established CHF action plan 2) contact [place] HF providers promptly and early for any concerns/ changes 3) report to the local ED immediately should she need emergent care, with transfer to [facility] should admission be required  ---- briefly discussed with patient newly added metformin therapy; reviewed with her last A1-C from 01/01/20 (6.7) (after multiple episodes of prednisone therapy)  Patient denies further issues, concerns, or problems today. I confirmed that patient hasmy direct phone number, the main [facility] CM office phone number, and the [facility] CM  24-hour nurse advice phone number should issues arise prior to next scheduled [place] upcoming scheduled heart failure appointment at [facility]. Encouraged patient to contact me directly if needs, questions, issues, or concerns arise prior to next scheduled outreach; patient agreed to do so.  Plan:  Patient will take medications as prescribed and will attend all scheduled provider appointments  Patient will promptly notify care providers for any new concerns/ issues/ problems that arise  Patient will continue monitoring/ recording daily weightsand following action plan for CHF zones  Patient will actively participate in home health RN infusion services as ordered post- hospital discharge  [place] outreach to continue with scheduled phone call post- upcomingscheduled provider officevisits  [facility] CM Care Plan Problem One     Most Recent Value  Care Plan Problem One  High risk for hospital readmission related to/ as evidenced by multiple recent hospitalizations for Acute Respiratory Failure/ CHF exacerbation with most recent hospital discharge on 01/08/2019  Role Documenting the Problem One  Care Management Coordinator  Care Plan for Problem One  Active  [facility] Long Term Goal   Over the next 31 days, patient will not experience unplanned hospital readmission, as evidenced by patient reporting and review of EMR during [facility] RN CCM outreach  [facility] Long Term Goal Start Date  01/09/20  Interventions for Problem One Long Term Goal  Discussed with patient her current clinical condition and confirmed that she continues doing well and denies current clinical concerns,  reviewed with patient recent daily weights and blood pressures at home,  confirmed that [facility] Pharmacist completed medication review with patient on 01/12/2020  [facility] CM Short Term Goal #1   Over the next 30 days, patient will actively participate in home health services for continuous milrinone infusion management/  lab work, as evidenced by patient reporting and collaboration with home health team as indicated during [facility] RN CM  outreach  [facility] CM Short Term Goal #1 Start Date  01/09/20  Interventions for Short Term Goal #1  Reviewed with patient recent home health visits by RN Infusion nurse,  confirmed that patient has had visit this week and has ongoing scheduled weekly visits,  confirmed that patient has contact information for home health team and has plan of action to contact home health infusion team should problems arise in the future  [facility] CM Short Term Goal #2   Over the next 30 days, patient will attend all scheduled provider appointments, as evidenced by patient reporting during [facility] RN CM outreach  [facility] CM Short Term Goal #2 Start Date  01/09/20  Interventions for Short Term Goal #2  Reviewed upcoming scheduled provider appointments with patient and confirmed that she has accurate understanding of scheduled appointments and ongoing reliable transportation to all scheduled appointments and plans to attend all as scheduled   [name], RN, BSN, [place] Coordinator [facility] Care Management  [phone]

## 2020-01-18 ENCOUNTER — Other Ambulatory Visit: Payer: Self-pay | Admitting: Internal Medicine

## 2020-01-21 DIAGNOSIS — E785 Hyperlipidemia, unspecified: Secondary | ICD-10-CM | POA: Diagnosis not present

## 2020-01-21 DIAGNOSIS — E1151 Type 2 diabetes mellitus with diabetic peripheral angiopathy without gangrene: Secondary | ICD-10-CM | POA: Diagnosis not present

## 2020-01-21 DIAGNOSIS — I255 Ischemic cardiomyopathy: Secondary | ICD-10-CM | POA: Diagnosis not present

## 2020-01-21 DIAGNOSIS — I272 Pulmonary hypertension, unspecified: Secondary | ICD-10-CM | POA: Diagnosis not present

## 2020-01-21 DIAGNOSIS — N183 Chronic kidney disease, stage 3 unspecified: Secondary | ICD-10-CM | POA: Diagnosis not present

## 2020-01-21 DIAGNOSIS — I739 Peripheral vascular disease, unspecified: Secondary | ICD-10-CM | POA: Diagnosis not present

## 2020-01-21 DIAGNOSIS — Z95810 Presence of automatic (implantable) cardiac defibrillator: Secondary | ICD-10-CM | POA: Diagnosis not present

## 2020-01-21 DIAGNOSIS — I340 Nonrheumatic mitral (valve) insufficiency: Secondary | ICD-10-CM | POA: Diagnosis not present

## 2020-01-21 DIAGNOSIS — I130 Hypertensive heart and chronic kidney disease with heart failure and stage 1 through stage 4 chronic kidney disease, or unspecified chronic kidney disease: Secondary | ICD-10-CM | POA: Diagnosis not present

## 2020-01-21 DIAGNOSIS — Z953 Presence of xenogenic heart valve: Secondary | ICD-10-CM | POA: Diagnosis not present

## 2020-01-21 DIAGNOSIS — Z87891 Personal history of nicotine dependence: Secondary | ICD-10-CM | POA: Diagnosis not present

## 2020-01-21 DIAGNOSIS — I252 Old myocardial infarction: Secondary | ICD-10-CM | POA: Diagnosis not present

## 2020-01-21 DIAGNOSIS — Z79899 Other long term (current) drug therapy: Secondary | ICD-10-CM | POA: Diagnosis not present

## 2020-01-21 DIAGNOSIS — K219 Gastro-esophageal reflux disease without esophagitis: Secondary | ICD-10-CM | POA: Diagnosis not present

## 2020-01-21 DIAGNOSIS — I2510 Atherosclerotic heart disease of native coronary artery without angina pectoris: Secondary | ICD-10-CM | POA: Diagnosis not present

## 2020-01-21 DIAGNOSIS — I5022 Chronic systolic (congestive) heart failure: Secondary | ICD-10-CM | POA: Diagnosis not present

## 2020-01-21 DIAGNOSIS — E1122 Type 2 diabetes mellitus with diabetic chronic kidney disease: Secondary | ICD-10-CM | POA: Diagnosis not present

## 2020-01-21 DIAGNOSIS — J449 Chronic obstructive pulmonary disease, unspecified: Secondary | ICD-10-CM | POA: Diagnosis not present

## 2020-01-21 DIAGNOSIS — Z951 Presence of aortocoronary bypass graft: Secondary | ICD-10-CM | POA: Diagnosis not present

## 2020-01-22 DIAGNOSIS — I5021 Acute systolic (congestive) heart failure: Secondary | ICD-10-CM | POA: Diagnosis not present

## 2020-01-22 DIAGNOSIS — Z7689 Persons encountering health services in other specified circumstances: Secondary | ICD-10-CM | POA: Diagnosis not present

## 2020-01-24 DIAGNOSIS — I5021 Acute systolic (congestive) heart failure: Secondary | ICD-10-CM | POA: Diagnosis not present

## 2020-01-26 ENCOUNTER — Encounter: Payer: Self-pay | Admitting: *Deleted

## 2020-01-26 ENCOUNTER — Other Ambulatory Visit: Payer: Self-pay | Admitting: *Deleted

## 2020-01-26 NOTE — Patient Outreach (Signed)
[facility]) Care Management [place] Telephone Outreach PCP office completes Transition of Care follow up post-hospital discharge Post- (most recent) hospital discharge day # 18  01/26/2020  Ashley [name] Dec 28, 1950 [id number]  Successful telephone outreach toDeborah Loyal [name], 69 y/o femaleoriginallyreferred to [place] by [facility] Liaison RN CM afterhospitalization July 21-25, 2067for acute on chronic CHF exacerbation.Patient has history including, but not limited to, combined CHF; CAD with previous MI/ CABG, and ICD; HTN/ HLD; GERD; COPD;history oftobacco use-patient has quit smoking since June 19, 2019.  Patient has multiple hospital re-admissions over the last year, most within 30 days of one another; all related to acute respiratory failure due to CHF exacerbation.  Most recent hospitalizations include:  January 12-13, 2021(33 days post-previoushospital discharge),again with acute respiratory failure secondary to CHF exacerbation.Patient was discharged home to self-care without home health services in place  February 11-14, 2021(29 days post last hospital discharge): Acute on Chronic CHF/ hypoxic respiratory failure; during hospitalization, patient had follow up ECHOcardiogram, which again indicated EF of 20-25%; patient was again discharged home to self-care without home health services in place  February 24-25, 2021(10 days post-last hospital discharge): ongoing CHF exacerbation with hypoxia; discharged home to self-care without home health services in place  January 02, 2020- January 08, 2020 at [facility]: patient was admitted to [facility] heart failure unit/ ICU after attending scheduled cardiothoracic surgeon provider office visit; patient was evaluated for LVAD vs. Heart Transplant vs. MVR revision; patient was discharged home to self-care on continuous milrinone gtt infusionvia PICC [place] home health services for infusion  RN.  Today, patient continues to report"doing so much better" since last hospital discharge; states she has been able to walk more and she denies shortness of breath with activity; states she is no longer having to use wheelchair to walk longer distances.  She denies pain and new/ recent falls, as well as current clinical concerns.  Patient sounds to be in no distress throughout call today.  Patient further reports:  -- no concerns around medications: has and is taking all prescribed; accurately verbalized isolated changes to medications post- recent cardiothoracic provider appointment 01/21/20, due to isolated low blood pressure values; reports that she initiated changes as instructed and states this is now resolved ---- reviewed recently recorded blood pressures at home: patient reports ranges over last several days between "113-158/74-102;" encouraged patient to share all home recorded values with HF provider and home health infusion team; she verbalizes understanding and agreement, states she is doing this ---- continues managing milrinone inotrope infusion at home with assistance from her fiancee; states going well; confirms that she asks home health infusion RN about any questions she has had around maintenance of infusion- states feels comfortable managing at home   -- recent post- hospital discharge provider appointmentsreviewed: Patient verbalizes accurate understanding and plans to attend all;confirms that she hasongoing reliable transportationthrough family members to all provider appointments ---- January 21, 2020: attended [facility] heart failure providerfor post-hospital discharge follow up/ evaluation of milrinone therapy; confirmed that patient has contact information for care providers at [facility]; reports overall plan to hold off on surgical intervention and continue milrinone infusion at home; hopeful that milrinone infusion dosing may eventually be decreased ---- plans returns to [facility]  for ongoing follow up visit on March 31. 2021  -- home health services continue through [facility] [phone] for infusion RN; patient confirms that she has had regular visits by home health nurse once per week now that she  has stabilized; next visit to patient's home scheduled for Thursday January 29, 2020; confirms that she has direct contact information for home health infusion RN- encouraged patient to promptly contact home health nurse for any issues that might arise at home with milrinone infusion- she verbalizes agreement and understanding  -- ongoing excellent understanding of self-health management of chronic disease state of CHF: patientcontinuesable to independently verbalize home self-health management strategiesand action plan for CHFwithout prompting; using teach back method, re-iterated with patient action plan for development of problems/ concerns/ issues ---- continues daily weight monitoring and recording at home; continues to report daily weight this morning at baseline, with today's weight of "164.0 lbs;" reports ranges of weights at home between "162-165 lbs" with baseline of "164" ---- has been monitoring blood pressures at home: reports "113/74" this morning, and reports that home health infusion team review all blood pressures/ weights at home with each visit-- as noted above, reviewed with patient signs/ symptoms low blood pressure along with corresponding action plan ----continues following low salt heart healthy diet ---- no peripheral swelling since last hospital discharge ---- confirms that general plan for concerns/ issues/ problems that arise is to  1) follow established CHF action plan 2) contact [place] HF providers promptly and early for any concerns/ changes 3) report to the local ED immediately should she need emergent care, with transfer to [facility] should admission be required   Patient denies further issues, concerns, or problems today. I confirmed that  patient hasmy direct phone number, the main [facility] CM office phone number, and the [facility] CM 24-hour nurse advice phone number should issues arise prior to next scheduled [facility] Community CM outreachin 2 weeks. Encouraged patient to contact me directly if needs, questions, issues, or concerns arise prior to next scheduled outreach; patient agreed to do so.  Plan:  Patient will take medications as prescribed and will attend all scheduled provider appointments  Patient will promptly notify care providers for any new concerns/ issues/ problems that arise  Patient will continue monitoring/ recording daily weightsand following action plan for CHF zones  Patient will actively participate in home health RN infusion services as ordered post- hospital discharge  [place] outreach to continue with scheduled phone call in 2 weeks  [facility] CM Care Plan Problem One     Most Recent Value  Care Plan Problem One  High risk for hospital readmission related to/ as evidenced by multiple recent hospitalizations for Acute Respiratory Failure/ CHF exacerbation with most recent hospital discharge on 12/20/2018  Role Documenting the Problem One  Care Management Coordinator  Care Plan for Problem One  Active  [facility] Long Term Goal   Over the next 31 days, patient will not experience unplanned hospital readmission, as evidenced by patient reporting and review of EMR during [facility] RN CCM outreach  [facility] Long Term Goal Start Date  01/09/20  Interventions for Problem One Long Term Goal  Discussed currentl clinical condition with patient and confirmed that she has no current clinical concerns,  reviewed with patient recently recorded values for daily weights and blood pressures at home,  confirmed that patient has all contact information for care providers and ensured that she understands to contact care providers promptly for any new concerns that arise,  discussed patient's recent isolated episodes of low blood pressure and  using teach back method, provided education around signs/ symptoms low blood pressure along with corresponding action plan for same  [facility] CM Short Term Goal #1   Over the  next 30 days, patient will actively participate in home health services for continuous milrinone infusion management/ lab work, as evidenced by patient reporting and collaboration with home health team as indicated during [employer] outreach  [facility] CM Short Term Goal #1 Start Date  01/09/20  Interventions for Short Term Goal #1  Confirmed that home health infusion team contiues working with patient and visiting her regularly at her home to manage/ educate around milrinone infusion,  reviewed recent home health visits and confirmed that patient has contact information for home health team  [facility] CM Short Term Goal #2   Over the next 30 days, patient will attend all scheduled provider appointments, as evidenced by patient reporting during [facility] RN CM outreach  [facility] CM Short Term Goal #2 Start Date  01/09/20  Interventions for Short Term Goal #2  Reviewed recent cardiothoracic provider appointment with patient as well as upcoming scheduled provider office visits,  confirmed that patient has accurate understanding of upcoming scheduled visits and has ongoing reliable transportation to all appointments     [name], RN, BSN, [place] Care Management  [phone]

## 2020-01-29 DIAGNOSIS — I5021 Acute systolic (congestive) heart failure: Secondary | ICD-10-CM | POA: Diagnosis not present

## 2020-01-31 DIAGNOSIS — I5021 Acute systolic (congestive) heart failure: Secondary | ICD-10-CM | POA: Diagnosis not present

## 2020-02-04 ENCOUNTER — Other Ambulatory Visit: Payer: Medicare Other

## 2020-02-04 DIAGNOSIS — E861 Hypovolemia: Secondary | ICD-10-CM | POA: Diagnosis not present

## 2020-02-04 DIAGNOSIS — I739 Peripheral vascular disease, unspecified: Secondary | ICD-10-CM | POA: Diagnosis not present

## 2020-02-04 DIAGNOSIS — I9589 Other hypotension: Secondary | ICD-10-CM | POA: Diagnosis not present

## 2020-02-04 DIAGNOSIS — M10471 Other secondary gout, right ankle and foot: Secondary | ICD-10-CM | POA: Diagnosis not present

## 2020-02-04 DIAGNOSIS — I5022 Chronic systolic (congestive) heart failure: Secondary | ICD-10-CM | POA: Diagnosis not present

## 2020-02-04 DIAGNOSIS — I5021 Acute systolic (congestive) heart failure: Secondary | ICD-10-CM | POA: Diagnosis not present

## 2020-02-04 DIAGNOSIS — I340 Nonrheumatic mitral (valve) insufficiency: Secondary | ICD-10-CM | POA: Diagnosis not present

## 2020-02-04 DIAGNOSIS — Z953 Presence of xenogenic heart valve: Secondary | ICD-10-CM | POA: Diagnosis not present

## 2020-02-04 LAB — CBC AND DIFFERENTIAL
HCT: 36 (ref 36–46)
Hemoglobin: 12.2 (ref 12.0–16.0)
Platelets: 224 (ref 150–399)
WBC: 9.2

## 2020-02-04 LAB — COMPREHENSIVE METABOLIC PANEL
Albumin: 3.8 (ref 3.5–5.0)
Calcium: 8.8 (ref 8.7–10.7)

## 2020-02-04 LAB — HEPATIC FUNCTION PANEL
ALT: 15 (ref 7–35)
AST: 16 (ref 13–35)
Alkaline Phosphatase: 56 (ref 25–125)
Bilirubin, Total: 1

## 2020-02-04 LAB — BASIC METABOLIC PANEL
BUN: 17 (ref 4–21)
CO2: 20 (ref 13–22)
Chloride: 100 (ref 99–108)
Creatinine: 1.4 — AB (ref 0.5–1.1)
Glucose: 137
Potassium: 3.2 — AB (ref 3.4–5.3)
Sodium: 134 — AB (ref 137–147)

## 2020-02-04 LAB — CBC: RBC: 3.74 — AB (ref 3.87–5.11)

## 2020-02-05 DIAGNOSIS — I5021 Acute systolic (congestive) heart failure: Secondary | ICD-10-CM | POA: Diagnosis not present

## 2020-02-06 DIAGNOSIS — I5021 Acute systolic (congestive) heart failure: Secondary | ICD-10-CM | POA: Diagnosis not present

## 2020-02-09 ENCOUNTER — Other Ambulatory Visit: Payer: Self-pay | Admitting: *Deleted

## 2020-02-09 ENCOUNTER — Encounter: Payer: Self-pay | Admitting: *Deleted

## 2020-02-09 NOTE — Patient Outreach (Signed)
[facility]) [place] Telephone Outreach PCP office completes Transition of Care follow up post-hospital discharge Post-hospital discharge day # 32- without subsequent hospital re-admission  02/09/2020  Ashley [name] 07-Jun-1951 [id number]  Successful telephone outreach toDeborah Loyal [name], 69 y/o femaleoriginallyreferred to [place] by [facility] Liaison RN CM afterhospitalization July 21-25, 2023fr acute on chronic CHF exacerbation.Patient has history including, but not limited to, combined CHF; CAD with previous MI/ CABG, and ICD; HTN/ HLD; GERD; COPD;history oftobacco use-patient has quit smoking since June 19, 2019.  Patient has multiple hospital re-admissions over the last year, most within 30 days of one another; all related to acute respiratory failure due to CHF exacerbation.  Most recent hospitalizations include:  [facility] 02[facility] 2020[facility] scheduled cardiothoracic surgeon provider office visit; patient was evaluated for LVAD vs. Heart Transplant vs. MVR revision; patient was discharged home to self-care on continuous milrinone gtt [facility].  Today, patient reports"doing pretty good,"however she tells me that she experienced an episode last night of difficulty breathing/ shortness of breath that awoken her from sleep; confirms that she followed her established action plan and that the episode "resolved completely within a couple of hours;" she tells me that she is breathing "much better" but continues to "feel very congested in my chest."  She reports "minimal pain" from recent gout flare and denies new/ recent falls and sounds to be in no distress throughout phone call today; reports SaO2 levels this morning at "94%."   Patient further reports:  -- no concerns around  medications: has and is taking all prescribed; able to accurately verbalize isolated change to diuretic therapy after last heart failure provider office visit on 02/04/20-- patient reports that her blood pressures had been "running low," and that the provider held her torsemide x 3 days; reports that she resumed taking yesterday (Sunday 02/08/20) at 20 mg po QD  -- blood pressures "better" with recent holding of diuretic; denies dizziness/ lightheadedness; reports this morning was 130/91  -- SaO2's "fine" after episode of shortness of breath resolved last night; 92-95% on room air  -- has attended all scheduled provider appointments; reviewed heart failure provider office visit with patient who now reports provider is consider "weaning" continuous milrinone infusion at home; patient is unsure if provider plans to wean completely off or to adjust down; talking points to cover at next provider office visit were discussed with patient; I strongly encouraged patient to notify care providers regarding her reported episode SOB last night and ongoing feelings of chest congestion and confirmed that she [facility] patient reports her heart failure care team has stayed in contact with her regularly by phone for updates; next scheduled appointment with [facility] Failure team scheduled for 02/25/20  -- home health services continue through [facility] [phone] infusion RN; patient confirms that she has had regular visitsby home health nurse once per week now that she has stabilized;nextvisit to patient's home scheduled forFriday of this week; patient verbalizes understanding to contact infusion team directly/ promptly for any concerns around milrinone infusion and  confirms that she has direct contact information for home health infusion RN  -- has continued monitoring and recording daily weights and CHF zones; reports daily weight ranges at home 163-166 lbs,  which is within her last reported range; weight  reported today as: 165 lbs; again able to independently verbalize accurate action plan for yellow CHF zone; denies noticeable swelling in extremities/ abdomen  ----continues following low salt heart healthy diet  ---- confirms that general plan for concerns/ issues/ problems that arise is (still) to  1) follow established CHF action plan 2) contact [place] HF providers promptly and early for any concerns/ changes 3) report to the local ED immediately should she need emergent care, with transfer to [facility] should admission be required   Patient denies further issues, concerns, or problems today. I confirmed that patient hasmy direct phone number, the main [facility] CM office phone number, and the [facility] CM 24-hour nurse advice phone number should issues arise prior to next scheduled [facility] Community CM outreachin 2 weeks. Encouraged patient to contact me directly if needs, questions, issues, or concerns arise prior to next scheduled outreach; patient agreed to do so.  Plan:  Patient will take medications as prescribed and will attend all scheduled provider appointments  Patient will promptly notify care providers for any new concerns/ issues/ problems that arise  Patient will continue monitoring/ recording daily weightsand following action plan for CHF zones  Patient will actively participate in home health RN infusion services as ordered post- hospital discharge  [place] outreach to continue with scheduled phone call in 2 weeks  [facility] CM Care Plan Problem One     Most Recent Value  Care Plan Problem One  High risk for hospital readmission related to/ as evidenced by multiple recent hospitalizations for Acute Respiratory Failure/ CHF exacerbation with most recent hospital discharge on 12/20/2018  Role Documenting the Problem One  Care Management Coordinator  Care Plan for Problem One  Not Active  [facility] Long Term Goal   Over the next 31 days,  patient will not experience unplanned hospital readmission, as evidenced by patient reporting and review of EMR during [place] outreach  [facility] Long Term Goal Start Date  01/09/20  [facility] Long Term Goal Met Date  02/09/20 [facility] met]  Interventions for Problem One Long Term Goal  Confirmed with patient that she has not experienced hospital re-admission within the last 30 days of her last discharge,  discussed with patient her current clinical condition and her reported episode last night of shortness of breath,  encouraged patient to report thisd episode today to her heart failure providers at [place] #1   Over the next 30 days, patient will actively participate in home health services for continuous milrinone infusion management/ lab work, as evidenced by patient reporting and collaboration with home health team as indicated during [place] CM outreach  [facility] CM Short Term Goal #1 Start Date  01/09/20  [facility] CM Short Term Goal #1 Met Date  02/09/20 [Goal Met]  Interventions for Short Term Goal #1  Confirmed that patient continues working with home health infusion RN and reviewed recent vsits with patient,  confirmed that patient has contact information for infusion nurse and understands to contact infusion home health nurse/ team for any changes/ problems that arise  [facility] CM Short Term Goal #2   Over the next 30 days, patient will attend all scheduled provider appointments, as evidenced by patient reporting during [facility] RN CM outreach  [facility] CM Short Term Goal #2 Start Date  01/09/20  [facility] CM Short Term Goal #2 Met Date  02/09/20 [Goal Met]  Interventions for Short Term Goal #2  Reviewed with patient recent office visit with  heart failure provider at [facility],  confirmed that she understands upcoming appointment dates and has no concerns around transportation    [facility] CM Care Plan Problem Two     Most Recent Value  Care Plan Problem Two  Ongoing self-health management of chronic disease state of CHF  in patient with frequent hospital admissions and now on continuous milrinone infusion at home, as evidenced by patient reporting  Role Documenting the Problem Two  Care Management [place] for Problem Two  Active  Interventions for Problem Two Long Term Goal   Reviewed with patient her recent office visit with heart failure provider and confirmed that she followed post- office visit instructions to stop diuretic x 3 days- confirmed she re-started diurectic at appropriate dose yesterday as instructed,  discussed with her the episode she had last night of difficulty breathing and confirmed that she followed established action plan,  encouraged patient to notify her care providers at [facility] of her stated (essentially now resolved) episode and her ongoing report that she is experiencing signs of chest congestion,  confirmed that she has the direct phone numbers for her heart failure team  [facility] Long Term Goal  Over the next 60 days, patient will verbalize long-term plan of care for management of significant baseline heart failure, as evidenced by patient reporting during [facility] RN CM outreach  [facility] Long Term Goal Start Date  02/09/20  [facility] CM Short Term Goal #1   Over the next 30 days, patient will attend all scheduled heart failure provider appointments for follow up on overall plan of care at home, as evidenced by patient reporting and collaboration with heart failure providers as indicated during [facility] RN CM outreach  [facility] CM Short Term Goal #1 Start Date  02/09/20  Interventions for Short Term Goal #2   Reviewed upcoming provider appointments and confirmed that patient has accurate understanding of all scheduled provider appointments along with plans to attend all,  confirmed that she has no transportation issues,  discussed long-term plan of care around continous milrinone infusion at home/ with possible plans to begin weaning     [name], RN, BSN, [employer]  Coordinator [facility] Care Management  [phone]

## 2020-02-11 DIAGNOSIS — I5021 Acute systolic (congestive) heart failure: Secondary | ICD-10-CM | POA: Diagnosis not present

## 2020-02-12 ENCOUNTER — Other Ambulatory Visit: Payer: Self-pay

## 2020-02-13 DIAGNOSIS — I5021 Acute systolic (congestive) heart failure: Secondary | ICD-10-CM | POA: Diagnosis not present

## 2020-02-15 DIAGNOSIS — I5021 Acute systolic (congestive) heart failure: Secondary | ICD-10-CM | POA: Diagnosis not present

## 2020-02-19 ENCOUNTER — Ambulatory Visit (INDEPENDENT_AMBULATORY_CARE_PROVIDER_SITE_OTHER): Payer: Medicare Other | Admitting: Pharmacist

## 2020-02-19 DIAGNOSIS — I5043 Acute on chronic combined systolic (congestive) and diastolic (congestive) heart failure: Secondary | ICD-10-CM | POA: Diagnosis not present

## 2020-02-19 DIAGNOSIS — I25700 Atherosclerosis of coronary artery bypass graft(s), unspecified, with unstable angina pectoris: Secondary | ICD-10-CM

## 2020-02-19 DIAGNOSIS — M109 Gout, unspecified: Secondary | ICD-10-CM

## 2020-02-19 NOTE — Chronic Care Management (AMB) (Signed)
Chronic Care Management   Follow Up Note   02/19/2020 Name: Ashley [name] MRN: [id number] DOB: 03-31-1951  Referred by: [name], MD Reason for referral : Chronic Care Management (Medication Management)   Ashley [name] is a 69 y.o. year old female who is a primary care patient of [name], [name], MD. The CCM team was consulted for assistance with chronic disease management and care coordination needs.    Contacted patient for medication management review  Review of patient status, including review of consultants reports, relevant laboratory and other test results, and collaboration with appropriate care team members and the patient's provider was performed as part of comprehensive patient evaluation and provision of chronic care management services.    SDOH (Social Determinants of Health) assessments performed: No See Care Plan activities for detailed interventions related to [facility])     Outpatient Encounter Medications as of 02/19/2020  Medication Sig Note  . allopurinol (ZYLOPRIM) 100 MG tablet Take 0.5 tablets (50 mg total) by mouth daily.   [name] ALPRAZolam (XANAX) 0.5 MG tablet Take 0.5-1 tablets (0.25-0.5 mg total) by mouth at bedtime as needed for anxiety.   [name] aspirin 81 MG tablet Take 81 mg by mouth daily.   [name] atorvastatin (LIPITOR) 40 MG tablet TAKE ONE TABLET BY MOUTH ONCE DAILY   . clopidogrel (PLAVIX) 75 MG tablet Take 1 tablet (75 mg total) by mouth daily.   . cyanocobalamin (,VITAMIN B-12,) 1000 MCG/ML injection Inject 1 mL into the muscle monthly (Patient taking differently: Inject 1,000 mcg into the muscle every 30 (thirty) days. )   . escitalopram (LEXAPRO) 10 MG tablet Take 1 tablet (10 mg total) by mouth daily.   . ferrous sulfate 325 (65 FE) MG EC tablet Take 325 mg by mouth daily with breakfast.   . Fluticasone-Umeclidin-Vilant (TRELEGY ELLIPTA) 100-62.5-25 MCG/INH AEPB Inhale 1 puff into the lungs daily.   [name] ipratropium-albuterol (DUONEB) 0.5-2.5 (3)  MG/3ML SOLN USE 1 VIAL VIA NEBULIZER EVERY 6 HOURS AS NEEDED   . metFORMIN (GLUCOPHAGE-XR) 500 MG 24 hr tablet Take 2 tablets (1,000 mg total) by mouth every morning.   . milrinone (PRIMACOR) 20 MG/100 ML SOLN infusion Inject 0.25 mcg/kg/min into the vein continuous. 01/09/2020: Added after [facility] HF admission 01/08/20  . omeprazole (PRILOSEC) 40 MG capsule Take 1 capsule (40 mg total) by mouth daily.   . potassium chloride (KLOR-CON) 10 MEQ tablet Take 2 tablets (20 mEq total) by mouth daily. (Patient taking differently: Take 10 mEq by mouth daily. )   . sacubitril-valsartan (ENTRESTO) 49-51 MG Take 1 tablet by mouth in the morning and at bedtime.   [name] spironolactone (ALDACTONE) 25 MG tablet Take 25 mg by mouth daily.   [name] torsemide (DEMADEX) 20 MG tablet Take 20 mg by mouth in the morning and at bedtime.    . traZODone (DESYREL) 50 MG tablet Take 0.5-1 tablets (25-50 mg total) by mouth daily. One hour before bedtime (Patient taking differently: Take 50 mg by mouth daily. One hour before bedtime)   . acetaminophen (TYLENOL) 325 MG tablet Take 650 mg by mouth every 6 (six) hours as needed.   [name] albuterol (VENTOLIN HFA) 108 (90 Base) MCG/ACT inhaler INHALE 2 PUFFS BY MOUTH INTO THE LUNGS EVERY 3 HOURS AS NEEDED FOR WHEEZING   . colchicine 0.6 MG tablet TAKE TWO TABLETS BY MOUTH AT FIRST SIGN OF GOUT FLARE FOLLOW BY 1 TABLET DAILY UNTIL GOUT FLARE RESOLVES   . diphenhydrAMINE (DIPHENHIST) 25 mg capsule Take 25  mg by mouth at bedtime as needed for allergies or sleep.    . Magnesium Oxide 400 (240 Mg) MG TABS Take 1 tablet by mouth daily.   . [DISCONTINUED] febuxostat (ULORIC) 40 MG tablet Take 1 tablet (40 mg total) by mouth daily. (Patient not taking: Reported on 01/12/2020)   . [DISCONTINUED] nitroGLYCERIN (NITROSTAT) 0.4 MG SL tablet Place 1 tablet (0.4 mg total) under the tongue every 5 (five) minutes as needed for chest pain. (Patient not taking: Reported on 01/09/2020) 01/09/2020: Reports discontinued 01/08/20 by  HF provider at [facility]   No facility-administered encounter medications on file as of 02/19/2020.     Objective:   Goals Addressed            This Visit's Progress     Patient Stated   . "I want to stay out of the hospital" (pt-stated)       [place] (see longtitudinal plan of care for additional care plan information)  Current Barriers:  . Polypharmacy; complex patient with multiple comorbidities including HFrEF, COPD, CAD (hx NSTEMI, s/p CABG x3 and mitral valve repair in 2007), continued severe mitral valve regurgitation; barrett's esophagus, depression . Most recent admission at [facility] 2/26-01/08/20. Discussion of LVAD vs heart transplant, determined not to be a candidate for either. Discharged on home milrenone. Weekly HHRN.  . Self-manages medications by filling a BID pill box. Has Medicare Extra Help- all brand medications $9.20 and generic $3.70 for 30 or 90 day supply o COPD: Tx Trelegy daily + albuterol HFA PRN or Duonebs PRN, needing these infrequently; follows w/ Dr. Patsey Berthold;  o CHF: HFrEF/CAD/mitral valve; managed on Entresto 49/51 BID, spironolactone 25 mg BID, Entresto 49/51 mg BID, torsemide 20 mg daily - though notes that over the past few days, she has been taking BID because she was gaining weight. Continues to communicate closely w/ [place] RN and [facility]. F/u next week.  o Gout: allopurinol 50 mg daily, baseline uric acid >10%, most recent uric acid level 8.4% on 02/04/20 per [facility]. Recent gout flare, tx w/ colchicine by cardiology on 02/04/20  o Insomnia: notes taking trazodone 50 mg QPM, alprazolam 0.25 mg QPM, and diphenhydramine 25 mg QPM for sleep.  o Hx tobacco abuse: patient has been TOBACCO FREE since 06/19/2019. [name]   Pharmacist Clinical Goal(s):  [name] Over the next 90 days, patient will work with PharmD and provider towards optimized medication management  Interventions: . Comprehensive medication review performed, medication list updated in  electronic medical record . Inter-disciplinary care team collaboration (see longitudinal plan of care) . Reviewed most recent uric acid still not at goal <6. Recommend increasing allopurinol to 100 mg daily. Will collaborate w/ Dr. Derrel Nip on this recommendation.   Patient Self Care Activities:  . Patient will take medications as prescribed . Patient will attend follow up appointments as scheduled.   Please see past updates related to this goal by clicking on the "Past Updates" button in the selected goal          Plan:  - Scheduled f/u call 04/22/20  [name] Darnelle Maffucci, PharmD, BCACP, [place] Pharmacist [place] [phone]

## 2020-02-19 NOTE — Patient Instructions (Signed)
Visit Information  Goals Addressed            This Visit's Progress     Patient Stated   . "I want to stay out of the hospital" (pt-stated)       [place] (see longtitudinal plan of care for additional care plan information)  Current Barriers:  . Polypharmacy; complex patient with multiple comorbidities including HFrEF, COPD, CAD (hx NSTEMI, s/p CABG x3 and mitral valve repair in 2007), continued severe mitral valve regurgitation; barrett's esophagus, depression . Most recent admission at [facility] 2/26-01/08/20. Discussion of LVAD vs heart transplant, determined not to be a candidate for either. Discharged on home milrenone. Weekly HHRN.  . Self-manages medications by filling a BID pill box. Has Medicare Extra Help- all brand medications $9.20 and generic $3.70 for 30 or 90 day supply o COPD: Tx Trelegy daily + albuterol HFA PRN or Duonebs PRN, needing these infrequently; follows w/ Dr. Patsey Berthold;  o CHF: HFrEF/CAD/mitral valve; managed on Entresto 49/51 BID, spironolactone 25 mg BID, Entresto 49/51 mg BID, torsemide 20 mg daily - though notes that over the past few days, she has been taking BID because she was gaining weight. Continues to communicate closely w/ [place] RN and [facility]. F/u next week.  o Gout: allopurinol 50 mg daily, baseline uric acid >10%, most recent uric acid level 8.4% on 02/04/20 per [facility]. Recent gout flare, tx w/ colchicine by cardiology on 02/04/20  o Insomnia: notes taking trazodone 50 mg QPM, alprazolam 0.25 mg QPM, and diphenhydramine 25 mg QPM for sleep.  o Hx tobacco abuse: patient has been TOBACCO FREE since 06/19/2019. [name]   Pharmacist Clinical Goal(s):  [name] Over the next 90 days, patient will work with PharmD and provider towards optimized medication management  Interventions: . Comprehensive medication review performed, medication list updated in electronic medical record . Inter-disciplinary care team collaboration (see longitudinal plan of  care) . Reviewed most recent uric acid still not at goal <6. Recommend increasing allopurinol to 100 mg daily. Will collaborate w/ Dr. Derrel Nip on this recommendation.   Patient Self Care Activities:  . Patient will take medications as prescribed . Patient will attend follow up appointments as scheduled.   Please see past updates related to this goal by clicking on the "Past Updates" button in the selected goal         Patient verbalizes understanding of instructions provided today.    Plan:  - Scheduled f/u call 04/22/20  [name] Darnelle Maffucci, PharmD, BCACP, CPP Clinical Pharmacist [place] [phone]

## 2020-02-20 DIAGNOSIS — I5021 Acute systolic (congestive) heart failure: Secondary | ICD-10-CM | POA: Diagnosis not present

## 2020-02-21 DIAGNOSIS — I5021 Acute systolic (congestive) heart failure: Secondary | ICD-10-CM | POA: Diagnosis not present

## 2020-02-23 ENCOUNTER — Other Ambulatory Visit: Payer: Self-pay | Admitting: *Deleted

## 2020-02-23 ENCOUNTER — Encounter: Payer: Self-pay | Admitting: *Deleted

## 2020-02-23 NOTE — Patient Outreach (Signed)
Ashley [name] Post-hospital discharge day # 46 without unplanned hospital re-admission  02/23/2020  Ashley [name] [birth year] [id number]  Successful telephone [name] toDeborah Loyal Gambler, 69 y/o femaleoriginallyreferred to [place] by [facility] Liaison RN CM afterhospitalization July 21-25, 2089for acute on chronic CHF exacerbation.Patient has history including, but not limited to, combined CHF; CAD with previous MI/ CABG, and ICD; HTN/ HLD; GERD; COPD;history oftobacco use-patient has quit smoking since June 19, 2019.  Patient has multiple hospital re-admissions over the last year, most within 30 days of one another; all related to acute respiratory failure due to CHF exacerbation.  Most recent hospitalizations include:  January 02, 2020- January 08, 2020 at [facility]: patient was admitted to [facility] heart failure unit/ ICU after attending scheduled cardiothoracic surgeon provider office visit; patient was evaluated for LVAD vs. Heart Transplant vs. MVR revision; patient was discharged home to self-care on continuous milrinone gtt infusionvia [facility] [place] home health services for infusion RN.  Today, patient reports, "all going great;" she denies clinical concerns, pain, new/ recent falls and sounds to be in no distress throughout phone call today.  Patient further reports:  -- no concerns around medications: has and is taking all as prescribed; reviewed use of diuretic and metformin and allopurinol; confirmed that patient is taking all medications as instructed and no concerns were identified during phone call; milrinone infusion continues as ordered post- last cardiology provider office visit  -- blood pressures "better" with recent home measurements by patient and home health infusion nurse; reports this morning was 128/93; denies ongoing low blood pressures, which she intermittently reported at  time of last [name]; reports recent ranges between 119-130/88-96  -- SaO2's "fine" reports ranges 92-95% on room air  -- has not had any recent scheduled provider appointments; verbalizes plans to attend cardiology provider appointment this Wednesday 02/25/20; confirms that she has reliable transportation; discussed with patient talking points to cover during scheduled appointment around possible milrinone weaning/ need to continue metformin ----  Reviewed with patient purpose of recently prescribed metformin in setting of previous prednisone use:  Patient confirms she is no longer taking metformin and we reviewed her A1-C trends over last year; using teach back method, education around significance of A1-C levels were discussed with patient, along with review of how the A1-C levels correspond to average blood sugars at home  -- home health servicescontinuethrough [facility] [phone] for infusion RN; patient confirms that she hashad regularvisitsby home health nurseonce per week now that she has stabilized; reports "going fine."  -- has continued monitoring and recording daily weights and CHF zones; reports daily weight ranges at home 161-163 lbs, which is within her last reported range; weight reported today as: 162.8 lbs; again able to independently verbalize accurate action plan for yellow CHF zone; denies noticeable swelling in extremities/ abdomen  ----continues following low salt heart healthy diet  ---- confirms that general plan for concerns/ issues/ problems that arise is (still) to  1) follow established CHF action plan 2) contact [place] HF providers promptly and early for any concerns/ changes 3) report to the local ED immediately should she need emergent care, with transfer to [facility] should admission be required   Patient denies further issues, concerns, or problems today. I confirmed that patient hasmy direct phone number, the main [facility] CM office phone number, and the  [facility] CM 24-hour nurse advice phone number should issues arise prior to next scheduled [place]  month. Encouraged patient to contact me directly if needs, questions, issues, or concerns arise prior to next scheduled [name]; patient agreed to do so.  Plan:  Patient will take medications as prescribed and will attend all scheduled provider appointments  Patient will promptly notify care providers for any new concerns/ issues/ problems that arise  Patient will continue monitoring/ recording daily weightsand following action plan for CHF zones  Patient will actively participate in home health RN infusion services as ordered post- hospital discharge  I will share today's [facility] CM note/ care plan with PCP as [facility] CM quarterly update/ summary  [facility] CM [name] to continue with scheduled phone callnext month, sooner if indicated  [facility] CM Care Plan Problem Two     Most Recent Value  Care Plan Problem Two  Ongoing self-health management of chronic disease state of CHF in patient with frequent hospital admissions and now on continuous milrinone infusion at home, as evidenced by patient reporting  Role Documenting the Problem Two  Care Management [place] for Problem Two  Active  Interventions for Problem Two Long Term Goal   Discussed with patient current clinical condition and confirmed that she has no current clinical concserns,  discussed home health services in place for milrinone infusion,  confirmed home health team continues visiting patient weekly,  reviewed recent daily weights and blood pressures, along with medications and confirmed that patient has had no recent changes to medications,  coached patient around talking points with cardiology provider and encouraged her to discuss long-term plan of care/ plan for milrinone infusion with cardiology provider  [facility] Long Term Goal  Over the next 60 days, patient will verbalize long-term plan of care  for management of significant baseline heart failure, as evidenced by patient reporting during [facility] RN CM [name]  [facility] Long Term Goal Start Date  02/09/20  [facility] CM Short Term Goal #1   Over the next 30 days, patient will attend all scheduled heart failure provider appointments for follow up on overall plan of care at home, as evidenced by patient reporting and collaboration with heart failure providers as indicated during [facility] RN CM [name] [Goal extended today aorund upcoming appointment]  [facility] CM Short Term Goal #1 Start Date  02/23/20 Barrie Folk extended today]  Interventions for Short Term Goal #2   Reviewed with patient upcoming scheduled provider office visit,  confirmed that she has reliable transportation to scheduled appointment Wednesday 02/25/20, along with plans to attend as scheduled     [name], RN, BSN, [place] Coordinator [facility] Care Management  [phone]

## 2020-02-25 DIAGNOSIS — I2510 Atherosclerotic heart disease of native coronary artery without angina pectoris: Secondary | ICD-10-CM | POA: Diagnosis not present

## 2020-02-25 DIAGNOSIS — I5022 Chronic systolic (congestive) heart failure: Secondary | ICD-10-CM | POA: Diagnosis not present

## 2020-02-25 DIAGNOSIS — I5021 Acute systolic (congestive) heart failure: Secondary | ICD-10-CM | POA: Diagnosis not present

## 2020-02-25 DIAGNOSIS — Z953 Presence of xenogenic heart valve: Secondary | ICD-10-CM | POA: Diagnosis not present

## 2020-02-25 DIAGNOSIS — I340 Nonrheumatic mitral (valve) insufficiency: Secondary | ICD-10-CM | POA: Diagnosis not present

## 2020-02-25 DIAGNOSIS — I255 Ischemic cardiomyopathy: Secondary | ICD-10-CM | POA: Diagnosis not present

## 2020-02-26 DIAGNOSIS — G9600 Cerebrospinal fluid leak, unspecified: Secondary | ICD-10-CM | POA: Diagnosis not present

## 2020-02-26 DIAGNOSIS — I5021 Acute systolic (congestive) heart failure: Secondary | ICD-10-CM | POA: Diagnosis not present

## 2020-02-27 DIAGNOSIS — I5021 Acute systolic (congestive) heart failure: Secondary | ICD-10-CM | POA: Diagnosis not present

## 2020-03-05 DIAGNOSIS — I5021 Acute systolic (congestive) heart failure: Secondary | ICD-10-CM | POA: Diagnosis not present

## 2020-03-09 ENCOUNTER — Other Ambulatory Visit: Payer: Self-pay | Admitting: Internal Medicine

## 2020-03-09 MED ORDER — ALLOPURINOL 100 MG PO TABS: 100.0000 mg | ORAL_TABLET | Freq: Every day | ORAL | 5 refills | Status: DC

## 2020-03-12 DIAGNOSIS — I5021 Acute systolic (congestive) heart failure: Secondary | ICD-10-CM | POA: Diagnosis not present

## 2020-03-13 DIAGNOSIS — I5021 Acute systolic (congestive) heart failure: Secondary | ICD-10-CM | POA: Diagnosis not present

## 2020-03-15 NOTE — Progress Notes (Signed)
  I have reviewed the above information and agree with above.   Eilam Shrewsbury, MD 

## 2020-03-18 ENCOUNTER — Other Ambulatory Visit: Payer: Self-pay | Admitting: Internal Medicine

## 2020-03-18 DIAGNOSIS — E7800 Pure hypercholesterolemia, unspecified: Secondary | ICD-10-CM

## 2020-03-18 DIAGNOSIS — I5021 Acute systolic (congestive) heart failure: Secondary | ICD-10-CM | POA: Diagnosis not present

## 2020-03-19 DIAGNOSIS — I5021 Acute systolic (congestive) heart failure: Secondary | ICD-10-CM | POA: Diagnosis not present

## 2020-03-23 ENCOUNTER — Other Ambulatory Visit: Payer: Self-pay | Admitting: *Deleted

## 2020-03-23 ENCOUNTER — Encounter: Payer: Self-pay | Admitting: *Deleted

## 2020-03-23 NOTE — Patient Outreach (Signed)
[place] [facility]) Care Management [facility] CM Telephone Outreach Post- hospital discharge day # 75 without unplanned hospital re-admission  03/23/2020  Ashley [name] May 17, 1951 [id number]  Successful telephone outreach toDeborah Loyal [name], 69 y/o femaleoriginallyreferred to [place] by [name] Liaison RN CM afterhospitalization July 21-25, 2051fr acute on chronic CHF exacerbation.Patient has history including, but not limited to, combined CHF; CAD with previous MI/ CABG, and ICD; HTN/ HLD; GERD; COPD;history oftobacco use-patient has quit smoking since June 19, 2019.  Patient has multiple hospital re-admissions over the last year, most within 30 days of one another; all related to acute respiratory failure due to CHF exacerbation.  Most recent hospital[facility] include:  [facility] attending scheduled cardiothoracic surgeon provider office visit; patient was evaluated for LVAD vs. Heart Transplant vs. MVR revision; patient was discharged home to self-care on continuous milrinone gtt infusionvia [facility] for infusion RN.  Today, patient reports, "all going really great;" she denies clinical concerns, pain, new/ recent falls and sounds to be in no distress throughout phone call today.  Patient further reports:  -- no concerns around medications: has and is taking all as prescribed; continues to self- manage all medications independently  -- continues monitoring/ recording blood pressures and reports "evened out;" stating that blood pressures are occasionally low or high, but "mainly fine;" denies signs/ symptoms low or high blood pressures and confirms that the cardiology provider team contacts her by phone weekly to review her lab work, blood pressures, SaO2, and daily weights at home  -- no recent scheduled provider appointments;  verbalizes plans to attend cardiology provider appointment tomorrow as scheduled and confirms that she will continue to have transportation through family members; reports plan for tomorrow's office visit with cardiology provider is to determine whether it is appropriate to discontinue home milrinone drip- coached patient around talking points to cover with cardiologist re: plan of action for problems/ home health services/ medications should milrinone be discontinued  -- home health servicescontinuethrough [facility] ([phone] infusion RN; patient confirms that she hashad ongoing regularvisitsby home health nurseonce per week now that she has stabilized; reports "going fine."  -- has continued monitoring and recording daily weights and CHF zones; reports daily weight ranges at home 160-163 lbs, which is within her last reported range; remains able to independently verbalize accurate action plan for yellow CHF zone; denies noticeable swelling in extremities/ abdomen, shortness of breath, cough; states "doing great;" and reports "much better" activity tolerance in general  ----continues following low salt heart healthy diet  ---- confirms that general plan for concerns/ issues/ problems that arise is(still)to  1) follow established CHF action plan 2) [facility] promptly and early for any concerns/ changes 3) report to the local ED immediately should she need emergent care, [facility]   Patient denies further issues, concerns, or problems today. I confirmed that patient hasmy direct phone number, the main [facility] [place]office phone number, and the [facility] 24-hour nurse advice phone number should issues arise prior to next scheduled [facility] CM outreachin 2 weeks, post- upcoming scheduled provider office visit. Encouraged patient to contact me directly if needs, questions, issues, or concerns arise prior to next scheduled outreach;  patient agreed to do so.  Plan:  Patient will take medications as prescribed and will attend all scheduled provider appointments  Patient  will promptly notify care providers for any new concerns/ issues/ problems that arise  Patient will continue monitoring/ recording daily weightsand following action plan for CHF zones  Patient will actively participate in home health RN infusion services as ordered post- hospital discharge  [place] outreach to continue with scheduled phone callin 2 weeks, sooner if indicated  [facility] CM Care Plan Problem Two     Most Recent Value  Care Plan Problem Two  Ongoing self-health management of chronic disease state of CHF in patient with frequent hospital admissions and now on continuous milrinone infusion at home, as evidenced by patient reporting  Role Documenting the Problem Two  Care Management [place] for Problem Two  Active  Interventions for Problem Two Long Term Goal   Discussed with patient her clinical condition and confirmed that she has no current clinical concerns,  reviewed with patient established action plan for ongoing IV milrinone infusion and confirmed that she continues having home health infusion nurse visits weekly and weekly follow up with cardiology provider team,  discussed with patient recent weaning attempts for milrinone and coached patient around talking points to cover with cardiology provider around ongoing action plan for problems, should milrinone eventually be completely weaned  [facility] Long Term Goal  Over the next 60 days, patient will verbalize long-term plan of care for management of significant baseline heart failure, as evidenced by patient reporting during [place] RN CM outreach  [facility] Long Term Goal Start Date  02/09/20  [facility] CM Short Term Goal #1   Over the next 30 days, patient will attend all scheduled heart failure provider appointments for follow up on overall plan of care at home, as evidenced by  patient reporting and collaboration with heart failure providers as indicated during [facility] RN CM outreach  [facility] CM Short Term Goal #1 Start Date  02/23/20  [facility] CM Short Term Goal #1 Met Date   03/23/20  Interventions for Short Term Goal #2   Reviewed last cardiology office visit with patient and upcoming scheduled appointment tomorrow,  confirmed that patient has ongoing reliable transportation and plans to attend appointment as scheduled     [name], RN, BSN, [place] Coordinator [facility] Care Management  [phone]

## 2020-03-24 DIAGNOSIS — I340 Nonrheumatic mitral (valve) insufficiency: Secondary | ICD-10-CM | POA: Diagnosis not present

## 2020-03-24 DIAGNOSIS — Z951 Presence of aortocoronary bypass graft: Secondary | ICD-10-CM | POA: Diagnosis not present

## 2020-03-24 DIAGNOSIS — I5022 Chronic systolic (congestive) heart failure: Secondary | ICD-10-CM | POA: Diagnosis not present

## 2020-03-24 DIAGNOSIS — J811 Chronic pulmonary edema: Secondary | ICD-10-CM | POA: Diagnosis not present

## 2020-03-24 DIAGNOSIS — F17200 Nicotine dependence, unspecified, uncomplicated: Secondary | ICD-10-CM | POA: Diagnosis not present

## 2020-03-24 DIAGNOSIS — Z95810 Presence of automatic (implantable) cardiac defibrillator: Secondary | ICD-10-CM | POA: Diagnosis not present

## 2020-03-24 DIAGNOSIS — I4581 Long QT syndrome: Secondary | ICD-10-CM | POA: Diagnosis not present

## 2020-03-24 DIAGNOSIS — I4589 Other specified conduction disorders: Secondary | ICD-10-CM | POA: Diagnosis not present

## 2020-03-24 DIAGNOSIS — I255 Ischemic cardiomyopathy: Secondary | ICD-10-CM | POA: Diagnosis not present

## 2020-03-24 DIAGNOSIS — Z953 Presence of xenogenic heart valve: Secondary | ICD-10-CM | POA: Diagnosis not present

## 2020-03-24 DIAGNOSIS — Z4502 Encounter for adjustment and management of automatic implantable cardiac defibrillator: Secondary | ICD-10-CM | POA: Diagnosis not present

## 2020-03-25 DIAGNOSIS — I5021 Acute systolic (congestive) heart failure: Secondary | ICD-10-CM | POA: Diagnosis not present

## 2020-03-29 DIAGNOSIS — I5021 Acute systolic (congestive) heart failure: Secondary | ICD-10-CM | POA: Diagnosis not present

## 2020-04-02 DIAGNOSIS — I5021 Acute systolic (congestive) heart failure: Secondary | ICD-10-CM | POA: Diagnosis not present

## 2020-04-07 DIAGNOSIS — Z953 Presence of xenogenic heart valve: Secondary | ICD-10-CM | POA: Diagnosis not present

## 2020-04-07 DIAGNOSIS — Z951 Presence of aortocoronary bypass graft: Secondary | ICD-10-CM | POA: Diagnosis not present

## 2020-04-07 DIAGNOSIS — I5022 Chronic systolic (congestive) heart failure: Secondary | ICD-10-CM | POA: Diagnosis not present

## 2020-04-07 DIAGNOSIS — I340 Nonrheumatic mitral (valve) insufficiency: Secondary | ICD-10-CM | POA: Diagnosis not present

## 2020-04-08 ENCOUNTER — Other Ambulatory Visit: Payer: Self-pay | Admitting: *Deleted

## 2020-04-08 ENCOUNTER — Telehealth: Payer: Self-pay | Admitting: Internal Medicine

## 2020-04-08 ENCOUNTER — Encounter: Payer: Self-pay | Admitting: *Deleted

## 2020-04-08 DIAGNOSIS — I5023 Acute on chronic systolic (congestive) heart failure: Secondary | ICD-10-CM

## 2020-04-08 NOTE — Patient Outreach (Signed)
[facility]) Care Management [facility] CM Telephone Outreach Post-hospital discharge day # 91 without unplanned hospital re-admission  04/08/2020  Ashley [name] Jan 26, 1951 [id number]  Successful telephone outreach toDeborah Loyal [name], 69 y/o femaleoriginallyreferred to Reform by [facility] Liaison RN CM afterhospitalization July 21-25, 2055fr acute on chronic CHF exacerbation.Patient has history including, but not limited to, combined CHF; CAD with previous MI/ CABG, and ICD; HTN/ HLD; GERD; COPD;history oftobacco use-patient has quit smoking since June 19, 2019.  Patient has had multiple hospital re-admissions over the last year, most within 30 days of one another; all related to acute respiratory failure due to CHF exacerbation.  Most recent hospitalizations include:  [facility] 02[facility] 2020[facility] attending scheduled cardiothoracic surgeon provider office visit; patient was evaluated for LVAD vs. Heart Transplant vs. MVR revision; patient was discharged home to self-care on continuous milrinone gtt infusionvia [facility]ICC [place] infusion RN.  Today, patient reports, "doing great;" she denies clinical concerns, pain, new/ recent falls and sounds to be in no distress throughout phone call today.  Patient further reports:  -- no concerns around medications: has and is taking allasprescribed; continues to self- manage all medications independently ---- reviewed with patient changes to medications post-cardiology provider appointment yesterday- she is able to accurately verbalize all changes and acknowledges that her continuous milrinone infusion/ PICC line was discontinued after weaning period, on Apr 02, 2020; medication list in EHR updated accordingly  -- continues monitoring/ recording blood pressures and reports "running on low side;" which led to the  changes in medications at cardiology office visit yesterday; continues monitoring and recording every morning; reports "fine today" and is able to verbalize signs/ symptoms low blood pressure with corresponding action plan  -- has attended all scheduled provider appointments;confirms ongoing reliable transportation through family members; next cardiology office visit "next month"  -- home health services no longer active as milrinone gtt has now been discontinued  -- has continued monitoring and recording daily weights and CHF zones; reports daily weight ranges at home 160-164lbs, which is within her last reported range; remains able to independently verbalize accurate action plan for yellow CHF zone; denies noticeable swelling in extremities/ abdomen, shortness of breath, cough; continues to report "much better" activity tolerance in general, and states that she and her family have a camping trip planned at the end of the month  ----continues following low salt heart healthy diet  ---- confirms that general plan for concerns/ issues/ problems that arise is(still)to  1) follow established CHF action plan 2) contact [facility] providers promptly and early for any concerns/ changes 3) report to the local ED immediately should she need emergent care, [facility]   Patient denies further issues, concerns, or problems today. I confirmed that patient hasmy direct phone number, the main TLowelloffice phone number, and the [facility] 24-hour nurse advice phone number should issues arise prior to next scheduled [facility]N CM outreachin 2 weeks, post- upcoming scheduled provider office visit. Encouraged patient to contact me directly if needs, questions, issues, or concerns arise prior to next scheduled outreach; patient agreed to do so.  Plan:  Patient will take medications as prescribed and will attend all scheduled provider appointments  Patient will promptly  notify care providers for any new concerns/ issues/ problems that arise  Patient will continue monitoring/ recording daily weightsand following action plan  for CHF zones  [facility] Community CM outreach to continue with scheduled phone callnext month, sooner if indicated  [facility] CM Care Plan Problem One     Most Recent Value  Care Plan Problem One  Need for ongoing reinforcement of self-health management strategies for CHF as evidenced by patient reporting  Role Documenting the Problem One  Care Management Coordinator  Care Plan for Problem One  Active  [facility] Long Term Goal   Over the next 60 days, patient will follow established plan of care for ongoing self-health management of CHF at home, as evidenced by patient reporting during [facility] RN CM outreach  [facility] Long Term Goal Start Date  04/08/20  Interventions for Problem One Long Term Goal  Discussed with patient current clinical condition and recent and upcoming provider appointments,  reviewed recent changes to medications post-cardiology provider appointment,  reviewed previously provided education regarding self-health management of CHF and yellow CHF zones and confirmed that patient continues able to accurately verbalize action plan for development of symptoms,  reviewed upcoming provider appointments and confirmed that patient continues to have reliable transportation through family members, along with plans to attend all as scheduled    [facility] CM Care Plan Problem Two     Most Recent Value  Care Plan Problem Two  Ongoing self-health management of chronic disease state of CHF in patient with frequent hospital admissions and now on continuous milrinone infusion at home, as evidenced by patient reporting  Role Documenting the Problem Two  Care Management Coordinator  Care Plan for Problem Two  Not Active  Interventions for Problem Two Long Term Goal   confirmed that patient has attended all recent provider appointments and has ongoing stable  transportation through family members,  reviewed recent provider office visits with patient and confirmed that she is able to accurately verbalize general plan of care post- milrinone gtt discontinuation  [facility] Long Term Goal  Over the next 60 days, patient will verbalize long-term plan of care for management of significant baseline heart failure, as evidenced by patient reporting during [facility] RN CM outreach  [facility] Long Term Goal Start Date  02/09/20  [facility] Long Term Goal Met Date  04/08/20 [Goal met]     [name], RN, BSN, [place] Coordinator [facility] Care Management  [phone]

## 2020-04-08 NOTE — Telephone Encounter (Signed)
BMET ordered.  ?

## 2020-04-08 NOTE — Telephone Encounter (Signed)
Ashley [name] with [facility] called to ask if we could do a BMP in our lab in the next 2 weeks. Please call back at [phone].

## 2020-04-08 NOTE — Telephone Encounter (Signed)
Is this okay to order?  

## 2020-04-13 ENCOUNTER — Telehealth: Payer: Self-pay | Admitting: Internal Medicine

## 2020-04-13 NOTE — Telephone Encounter (Signed)
[place] would like a call back concerning the prescription carvedilol.

## 2020-04-14 NOTE — Telephone Encounter (Signed)
No ,  She did not get carvedilol rx from me  She does not need to be on any beta blocker.  Cardiology also in agreement.

## 2020-04-14 NOTE — Telephone Encounter (Signed)
Spoke with pt and schedule her for a lab appt next week.

## 2020-04-14 NOTE — Telephone Encounter (Signed)
Spoke with Pharmacist at [facility] about the pt's carvedilol. She stated that this medication is not recommended for her because she has both asthma and COPD. Looking in the chart it looks like this medication was discontinued by the cardiothoracic team at [facility] on 01/08/2020. Pharmacist stated that pt has still been getting the prescription filled. She is wanting to know if the pt needs to be one a different beta blocker that is safe for her to take that doesn't interfere with her asthma and COPD.

## 2020-04-15 NOTE — Telephone Encounter (Signed)
Spoke with Pharmacy to let them know that the Carvedilol does not need to be filled and that the pt does not need any other beta blocker. Pt stated that she is not taking this medication any ways.

## 2020-04-21 ENCOUNTER — Other Ambulatory Visit: Payer: Self-pay

## 2020-04-21 ENCOUNTER — Other Ambulatory Visit (INDEPENDENT_AMBULATORY_CARE_PROVIDER_SITE_OTHER): Payer: Medicare Other

## 2020-04-21 DIAGNOSIS — E7800 Pure hypercholesterolemia, unspecified: Secondary | ICD-10-CM

## 2020-04-21 DIAGNOSIS — D500 Iron deficiency anemia secondary to blood loss (chronic): Secondary | ICD-10-CM

## 2020-04-21 DIAGNOSIS — I5023 Acute on chronic systolic (congestive) heart failure: Secondary | ICD-10-CM | POA: Diagnosis not present

## 2020-04-21 DIAGNOSIS — D519 Vitamin B12 deficiency anemia, unspecified: Secondary | ICD-10-CM

## 2020-04-21 DIAGNOSIS — M109 Gout, unspecified: Secondary | ICD-10-CM

## 2020-04-21 LAB — BASIC METABOLIC PANEL
BUN: 32 mg/dL — ABNORMAL HIGH (ref 6–23)
CO2: 25 mEq/L (ref 19–32)
Calcium: 9.5 mg/dL (ref 8.4–10.5)
Chloride: 99 mEq/L (ref 96–112)
Creatinine, Ser: 1.4 mg/dL — ABNORMAL HIGH (ref 0.40–1.20)
GFR: 37.51 mL/min — ABNORMAL LOW (ref >60.00)
Glucose, Bld: 203 mg/dL — ABNORMAL HIGH (ref 70–99)
Potassium: 4 mEq/L (ref 3.5–5.1)
Sodium: 137 mEq/L (ref 135–145)

## 2020-04-22 ENCOUNTER — Ambulatory Visit (INDEPENDENT_AMBULATORY_CARE_PROVIDER_SITE_OTHER): Payer: Medicare Other | Admitting: Pharmacist

## 2020-04-22 DIAGNOSIS — I5023 Acute on chronic systolic (congestive) heart failure: Secondary | ICD-10-CM | POA: Diagnosis not present

## 2020-04-22 DIAGNOSIS — I25700 Atherosclerosis of coronary artery bypass graft(s), unspecified, with unstable angina pectoris: Secondary | ICD-10-CM | POA: Diagnosis not present

## 2020-04-22 NOTE — Chronic Care Management (AMB) (Signed)
Chronic Care Management   Follow Up Note   04/22/2020 Name: KHILYNN BORNTREGER MRN: [id number] DOB: [birth year]  Referred by: [name], MD Reason for referral : Chronic Care Management (Medication Management)   ALEXSANDRIA KIVETT is a 69 y.o. year old female who is a primary care patient of [name], [name], MD. The CCM team was consulted for assistance with chronic disease management and care coordination needs.    Contacted patient for medication management review.  Review of patient status, including review of consultants reports, relevant laboratory and other test results, and collaboration with appropriate care team members and the patient's provider was performed as part of comprehensive patient evaluation and provision of chronic care management services.    SDOH (Social Determinants of Health) assessments performed: Yes See Care Plan activities for detailed interventions related to SDOH)  SDOH Interventions     Most Recent Value  SDOH Interventions  Financial Strain Interventions Other (Comment)  [PAN foundation application]       Outpatient Encounter Medications as of 04/22/2020  Medication Sig Note  . acetaminophen (TYLENOL) 325 MG tablet Take 650 mg by mouth every 6 (six) hours as needed.   [name] albuterol (VENTOLIN HFA) 108 (90 Base) MCG/ACT inhaler INHALE 2 PUFFS BY MOUTH INTO THE LUNGS EVERY 3 HOURS AS NEEDED FOR WHEEZING   . allopurinol (ZYLOPRIM) 100 MG tablet Take 1 tablet (100 mg total) by mouth daily.   [name] ALPRAZolam (XANAX) 0.5 MG tablet Take 0.5-1 tablets (0.25-0.5 mg total) by mouth at bedtime as needed for anxiety. 04/22/2020: QPM  . aspirin 81 MG tablet Take 81 mg by mouth daily.   [name] atorvastatin (LIPITOR) 40 MG tablet TAKE ONE TABLET BY MOUTH ONCE DAILY   . clopidogrel (PLAVIX) 75 MG tablet Take 1 tablet (75 mg total) by mouth daily.   . colchicine 0.6 MG tablet TAKE TWO TABLETS BY MOUTH AT FIRST SIGN OF GOUT FLARE FOLLOW BY 1 TABLET DAILY UNTIL GOUT FLARE  RESOLVES 04/22/2020: Taking once daily  . cyanocobalamin (,VITAMIN B-12,) 1000 MCG/ML injection Inject 1 mL into the muscle monthly (Patient taking differently: Inject 1,000 mcg into the muscle every 30 (thirty) days. )   . diphenhydrAMINE (DIPHENHIST) 25 mg capsule Take 25 mg by mouth at bedtime as needed for allergies or sleep.    [name] escitalopram (LEXAPRO) 10 MG tablet Take 1 tablet (10 mg total) by mouth daily.   . ferrous sulfate 325 (65 FE) MG EC tablet Take 325 mg by mouth daily with breakfast.   . Fluticasone-Umeclidin-Vilant (TRELEGY ELLIPTA) 100-62.5-25 MCG/INH AEPB Inhale 1 puff into the lungs daily.   [name] ipratropium-albuterol (DUONEB) 0.5-2.5 (3) MG/3ML SOLN USE 1 VIAL VIA NEBULIZER EVERY 6 HOURS AS NEEDED   . Magnesium Oxide 400 (240 Mg) MG TABS Take 1 tablet by mouth daily.   . metFORMIN (GLUCOPHAGE-XR) 500 MG 24 hr tablet Take 2 tablets (1,000 mg total) by mouth every morning.   [name] omeprazole (PRILOSEC) 40 MG capsule TAKE ONE CAPSULE BY MOUTH ONCE DAILY   . potassium chloride (KLOR-CON) 10 MEQ tablet Take 2 tablets (20 mEq total) by mouth daily. 04/22/2020: Only taking 10 mEq  . sacubitril-valsartan (ENTRESTO) 24-26 MG Take 1 tablet by mouth in the morning and at bedtime.   [name] spironolactone (ALDACTONE) 25 MG tablet Take 25 mg by mouth daily.   [name] torsemide (DEMADEX) 20 MG tablet Take 20 mg by mouth in the morning and at bedtime.  04/22/2020: 1 QAM, occasionally taking second dose PM  .  traZODone (DESYREL) 50 MG tablet Take 0.5-1 tablets (25-50 mg total) by mouth daily. One hour before bedtime (Patient taking differently: Take 50 mg by mouth daily. One hour before bedtime)   . [DISCONTINUED] milrinone (PRIMACOR) 20 MG/100 ML SOLN infusion Inject 0.25 mcg/kg/min into the vein continuous. 04/08/2020: Reports was discontinued 04/02/20 per [place] cardiologist  . [DISCONTINUED] sacubitril-valsartan (ENTRESTO) 49-51 MG Take 1 tablet by mouth in the morning and at bedtime. 04/08/2020: Reports decreased dose  to 24-26 mg BID on 04/07/20 at [facility] cardiology appointment/ due to low BP   No facility-administered encounter medications on file as of 04/22/2020.     Objective:   Goals Addressed              This Visit's Progress     Patient Stated   .  "I want to stay out of the hospital" (pt-stated)        [place] (see longtitudinal plan of care for additional care plan information)  Current Barriers:  . Polypharmacy; complex patient with multiple comorbidities including HFrEF, COPD, CAD (hx NSTEMI, s/p CABG x3 and mitral valve repair in 2007), continued severe mitral valve regurgitation; barrett's esophagus, depression . Looking forward to a camping trip with her grandson next weel . Continues to follow w/ [facility]. Reports that her medications are numerous, which gets expensive.  Ladon Applebaum new "numbness, and pins and needles pricking" in her feet, specifically at night. Notes that she has been taking colchicine PRN to see if that helps. Denies benefit.  . Self-manages medications by filling a BID pill box. Has Medicare Extra Help- all brand medications $9.20 and generic $3.70 for 30 or 90 day supply o COPD: Tx Trelegy daily + albuterol HFA PRN or Duonebs PRN, notes she has needed nebs twice since milrenone drip was d/c o CHF: HFrEF/CAD/mitral valve; managed on Entresto 49/51 BID, spironolactone 25 mg BID, Entresto 24/26 mg BID (recently reduced, notes [place] filled the wrong dose and she is planning on calling to get the correct dose filled; torsemide 20 mg daily, occasionally evening dose if needed.  o Insomnia: notes taking trazodone 50 mg QPM, alprazolam 0.25 mg QPM, and diphenhydramine 25 mg QPM for sleep.  o Hx tobacco abuse: patient has been TOBACCO FREE since 06/19/2019. [name]   Pharmacist Clinical Goal(s):  [name] Over the next 90 days, patient will work with PharmD and provider towards optimized medication management  Interventions: . Comprehensive medication review performed,  medication list updated in electronic medical record . Inter-disciplinary care team collaboration (see longitudinal plan of care) . Encouraged patient to schedule f/u appointment w/ Dr. Derrel Nip for chronic disease state management, but also encouraged to discuss the potential symptoms of peripheral neuropathy with her. Explained that colchicine unlikely to provide benefit in peripheral neuropathy, and there are other options likely more beneficial.  . Will fax updated medication list to [facility]. Encouraged patient to call the pharmacy to review current doses.  . Discussed [employer] Heart Failure grant funding. Provided patient w/ phone number to call to apply for funding assistance for Entresto, spironolactone, potassium, and torsemide. She will call me to let me know if she was approved. Educated to take billing information to [facility], once received.   Patient Self Care Activities:  . Patient will take medications as prescribed . Patient will attend follow up appointments as scheduled.   Please see past updates related to this goal by clicking on the "Past Updates" button in the selected goal  Plan:  - Scheduled f/u call in ~ 8 weeks  [name] Darnelle Maffucci, PharmD, [place], [place] Pharmacist [place] [phone]

## 2020-04-22 NOTE — Patient Instructions (Signed)
Visit Information  Goals Addressed              This Visit's Progress     Patient Stated   .  "I want to stay out of the hospital" (pt-stated)        [place] (see longtitudinal plan of care for additional care plan information)  Current Barriers:  . Polypharmacy; complex patient with multiple comorbidities including HFrEF, COPD, CAD (hx NSTEMI, s/p CABG x3 and mitral valve repair in 2007), continued severe mitral valve regurgitation; barrett's esophagus, depression . Looking forward to a camping trip with her grandson next weel . Continues to follow w/ [facility]. Reports that her medications are numerous, which gets expensive.  Ladon Applebaum new "numbness, and pins and needles pricking" in her feet, specifically at night. Notes that she has been taking colchicine PRN to see if that helps. Denies benefit.  . Self-manages medications by filling a BID pill box. Has Medicare Extra Help- all brand medications $9.20 and generic $3.70 for 30 or 90 day supply o COPD: Tx Trelegy daily + albuterol HFA PRN or Duonebs PRN, notes she has needed nebs twice since milrenone drip was d/c o CHF: HFrEF/CAD/mitral valve; managed on Entresto 49/51 BID, spironolactone 25 mg BID, Entresto 24/26 mg BID (recently reduced, notes [place] filled the wrong dose and she is planning on calling to get the correct dose filled; torsemide 20 mg daily, occasionally evening dose if needed.  o Insomnia: notes taking trazodone 50 mg QPM, alprazolam 0.25 mg QPM, and diphenhydramine 25 mg QPM for sleep.  o Hx tobacco abuse: patient has been TOBACCO FREE since 06/19/2019. [name]   Pharmacist Clinical Goal(s):  [name] Over the next 90 days, patient will work with PharmD and provider towards optimized medication management  Interventions: . Comprehensive medication review performed, medication list updated in electronic medical record . Inter-disciplinary care team collaboration (see longitudinal plan of care) . Encouraged patient  to schedule f/u appointment w/ Dr. Derrel Nip for chronic disease state management, but also encouraged to discuss the potential symptoms of peripheral neuropathy with her. Explained that colchicine unlikely to provide benefit in peripheral neuropathy, and there are other options likely more beneficial.  . Will fax updated medication list to [facility]. Encouraged patient to call the pharmacy to review current doses.  . Discussed [employer] Heart Failure grant funding. Provided patient w/ phone number to call to apply for funding assistance for Entresto, spironolactone, potassium, and torsemide. She will call me to let me know if she was approved. Educated to take billing information to [facility], once received.   Patient Self Care Activities:  . Patient will take medications as prescribed . Patient will attend follow up appointments as scheduled.   Please see past updates related to this goal by clicking on the "Past Updates" button in the selected goal         Patient verbalizes understanding of instructions provided today.   Plan:  - Scheduled f/u call in ~ 8 weeks  [name] Darnelle Maffucci, PharmD, [facility], [place] Pharmacist [place] [phone]

## 2020-04-28 ENCOUNTER — Telehealth: Payer: Self-pay | Admitting: Internal Medicine

## 2020-04-28 NOTE — Telephone Encounter (Signed)
[facility] is requesting a copy of pt's latest BMP to be sent to them. Their fax number is [phone].

## 2020-04-28 NOTE — Telephone Encounter (Signed)
Faxed

## 2020-05-07 ENCOUNTER — Ambulatory Visit (INDEPENDENT_AMBULATORY_CARE_PROVIDER_SITE_OTHER): Payer: Medicare Other

## 2020-05-07 VITALS — BP 140/87 | HR 87 | Ht 63.0 in | Wt 169.0 lb

## 2020-05-07 DIAGNOSIS — Z1231 Encounter for screening mammogram for malignant neoplasm of breast: Secondary | ICD-10-CM | POA: Diagnosis not present

## 2020-05-07 DIAGNOSIS — Z0000 Encounter for general adult medical examination without abnormal findings: Secondary | ICD-10-CM

## 2020-05-07 NOTE — Progress Notes (Addendum)
Subjective:   Ashley [name] is a 69 y.o. female who presents for Medicare Annual (Subsequent) preventive examination.  Review of Systems    No ROS.  Medicare Wellness Virtual Visit.   Cardiac Risk Factors include: advanced age (>36men, >66 women);hypertension     Objective:    Today's Vitals   05/07/20 0903  BP: 140/87  Pulse: 87  Weight: 169 lb (76.7 kg)  Height: 5\' 3"  (1.6 m)   Body mass index is 29.94 kg/m.  Advanced Directives 05/07/2020 01/09/2020 12/31/2019 12/31/2019 12/19/2019 12/18/2019 11/19/2019  Does Patient Have a Medical Advance Directive? Yes Yes No No No No No  Type of Advance Directive Out of facility DNR (pink MOST or yellow form) [place]  Does patient want to make changes to medical advance directive? No - Patient declined No - Patient declined - - - - -  Would patient like information on creating a medical advance directive? - No - Patient declined No - Patient declined No - Patient declined No - Patient declined - No - Patient declined    Current Medications (verified) Outpatient Encounter Medications as of 05/07/2020  Medication Sig  . acetaminophen (TYLENOL) 325 MG tablet Take 650 mg by mouth every 6 (six) hours as needed.  [name] albuterol (VENTOLIN HFA) 108 (90 Base) MCG/ACT inhaler INHALE 2 PUFFS BY MOUTH INTO THE LUNGS EVERY 3 HOURS AS NEEDED FOR WHEEZING  . allopurinol (ZYLOPRIM) 100 MG tablet Take 1 tablet (100 mg total) by mouth daily.  [name] ALPRAZolam (XANAX) 0.5 MG tablet Take 0.5-1 tablets (0.25-0.5 mg total) by mouth at bedtime as needed for anxiety.  [name] aspirin 81 MG tablet Take 81 mg by mouth daily.  [name] atorvastatin (LIPITOR) 40 MG tablet TAKE ONE TABLET BY MOUTH ONCE DAILY  . clopidogrel (PLAVIX) 75 MG tablet Take 1 tablet (75 mg total) by mouth daily.  . colchicine 0.6 MG tablet TAKE TWO TABLETS BY MOUTH AT FIRST SIGN OF GOUT FLARE FOLLOW BY 1 TABLET DAILY UNTIL GOUT FLARE RESOLVES  . cyanocobalamin (,VITAMIN B-12,) 1000  MCG/ML injection Inject 1 mL into the muscle monthly (Patient taking differently: Inject 1,000 mcg into the muscle every 30 (thirty) days. )  . diphenhydrAMINE (DIPHENHIST) 25 mg capsule Take 25 mg by mouth at bedtime as needed for allergies or sleep.   [name] escitalopram (LEXAPRO) 10 MG tablet Take 1 tablet (10 mg total) by mouth daily.  . ferrous sulfate 325 (65 FE) MG EC tablet Take 325 mg by mouth daily with breakfast.  . Fluticasone-Umeclidin-Vilant (TRELEGY ELLIPTA) 100-62.5-25 MCG/INH AEPB Inhale 1 puff into the lungs daily.  [name] ipratropium-albuterol (DUONEB) 0.5-2.5 (3) MG/3ML SOLN USE 1 VIAL VIA NEBULIZER EVERY 6 HOURS AS NEEDED  . Magnesium Oxide 400 (240 Mg) MG TABS Take 1 tablet by mouth daily.  . metFORMIN (GLUCOPHAGE-XR) 500 MG 24 hr tablet Take 2 tablets (1,000 mg total) by mouth every morning.  [name] omeprazole (PRILOSEC) 40 MG capsule TAKE ONE CAPSULE BY MOUTH ONCE DAILY  . potassium chloride (KLOR-CON) 10 MEQ tablet Take 2 tablets (20 mEq total) by mouth daily.  . sacubitril-valsartan (ENTRESTO) 24-26 MG Take 1 tablet by mouth in the morning and at bedtime.  [name] spironolactone (ALDACTONE) 25 MG tablet Take 25 mg by mouth daily.  [name] torsemide (DEMADEX) 20 MG tablet Take 20 mg by mouth in the morning and at bedtime.   . traZODone (DESYREL) 50 MG tablet Take 0.5-1 tablets (25-50 mg total) by mouth  daily. One hour before bedtime (Patient taking differently: Take 50 mg by mouth daily. One hour before bedtime)   No facility-administered encounter medications on file as of 05/07/2020.    Allergies (verified) Vancomycin and Hydrocodone-acetaminophen   History: Past Medical History:  Diagnosis Date  . AICD (automatic cardioverter/defibrillator) present    on right side  . Anemia   . CAD (coronary artery disease)    s/p CABG  . CHF (congestive heart failure) ([place])   . Chronic kidney disease    renal artery stenosis  . Colon cancer ([place])   . Depression   . GERD (gastroesophageal reflux  disease)   . Hx of colonic polyps   . Hyperlipidemia   . Hypertension   . Mitral valve disorder    s/p mitral valve repair wth CABG  . Myocardial infarction ([place])   . Peripheral vascular disease ([place])   . Presence of permanent cardiac pacemaker    Pacemaker/ Defibrillator  . Tobacco abuse 10/11/2014   Past Surgical History:  Procedure Laterality Date  . arm surgery     fracture, has plates and screws  . CABG with mitral valve repair    . CARDIAC CATHETERIZATION    . COLON SURGERY     colon cancer  . COLONOSCOPY WITH PROPOFOL N/A 10/09/2016   Procedure: COLONOSCOPY WITH PROPOFOL;  Surgeon: Jonathon Bellows, MD;  Location: [facility] ENDOSCOPY;  Service: Endoscopy;  Laterality: N/A;  . COLONOSCOPY WITH PROPOFOL N/A 07/24/2018   Procedure: COLONOSCOPY WITH PROPOFOL;  Surgeon: Virgel Manifold, MD;  Location: [facility] ENDOSCOPY;  Service: Endoscopy;  Laterality: N/A;  . CORONARY ARTERY BYPASS GRAFT    . ENTEROSCOPY N/A 11/21/2018   Procedure: ENTEROSCOPY-BALLOON;  Surgeon: Jonathon Bellows, MD;  Location: [facility] ENDOSCOPY;  Service: Gastroenterology;  Laterality: N/A;  . ESOPHAGOGASTRODUODENOSCOPY (EGD) WITH PROPOFOL N/A 07/24/2018   Procedure: ESOPHAGOGASTRODUODENOSCOPY (EGD) WITH PROPOFOL;  Surgeon: Virgel Manifold, MD;  Location: [facility] ENDOSCOPY;  Service: Endoscopy;  Laterality: N/A;  . GIVENS CAPSULE STUDY N/A 08/16/2018   Procedure: GIVENS CAPSULE STUDY;  Surgeon: Virgel Manifold, MD;  Location: [facility] ENDOSCOPY;  Service: Endoscopy;  Laterality: N/A;  . pace maker defib  2016  . PERIPHERAL VASCULAR CATHETERIZATION N/A 10/23/2016   Procedure: Renal Angiography;  Surgeon: Algernon Huxley, MD;  Location: [place] [facility];  Service: Cardiovascular;  Laterality: N/A;  . RIGHT/LEFT HEART CATH AND CORONARY ANGIOGRAPHY N/A 08/26/2019   Procedure: RIGHT/LEFT HEART CATH AND CORONARY ANGIOGRAPHY;  Surgeon: Isaias Cowman, MD;  Location: [place] [facility];  Service: Cardiovascular;  Laterality:  N/A;  . TOTAL ABDOMINAL HYSTERECTOMY  1999   history of abnormal pap   Family History  Problem Relation Age of Onset  . Arthritis Mother   . Cancer Mother        uterus cancer  . Hyperlipidemia Mother   . Hypertension Mother   . Heart disease Mother   . Diabetes Mother   . Hyperlipidemia Father   . Hypertension Father   . Heart disease Father   . Diabetes Father   . Cancer Sister        ovary cancer  . Diabetes Maternal Grandmother   . Hypertension Maternal Grandmother   . Arthritis Maternal Grandmother   . Hypertension Maternal Grandfather   . Hypertension Paternal Grandmother   . Hypertension Paternal Grandfather   . Heart disease Paternal Grandfather   . Heart disease Brother   . Kidney disease Brother   . Breast cancer Neg Hx    Social  History   Socioeconomic History  . Marital status: Single    Spouse name: Not on file  . Number of children: 0  . Years of education: 54  . Highest education level: 11th grade  Occupational History  . Occupation: disabled  Tobacco Use  . Smoking status: Former Smoker    Packs/day: 0.25    Types: Cigarettes    Quit date: 06/19/2019    Years since quitting: 0.8  . Smokeless tobacco: Never Used  . Tobacco comment: reports smoking 2-3 cigarettes/ day  Vaping Use  . Vaping Use: Never used  Substance and Sexual Activity  . Alcohol use: Yes    Alcohol/week: 2.0 standard drinks    Types: 2 Shots of liquor per week    Comment: occasion  . Drug use: No  . Sexual activity: Yes    Comment: 1 partner  Other Topics Concern  . Not on file  Social History Narrative   07/01/2019: Patient reports she has stopped smoking completely since she was hospitalized on June 19, 2019   Social Determinants of Health   Financial Resource Strain: Medium Risk  . Difficulty of Paying Living Expenses: Somewhat hard  Food Insecurity: No Food Insecurity  . Worried About Charity fundraiser in the Last Year: Never true  . Ran Out of Food in the Last  Year: Never true  Transportation Needs: No Transportation Needs  . Lack of Transportation (Medical): No  . Lack of Transportation (Non-Medical): No  Physical Activity: Unknown  . Days of Exercise per Week: Not on file  . Minutes of Exercise per Session: 0 min  Stress:   . Feeling of Stress :   Social Connections: Unknown  . Frequency of Communication with Friends and Family: More than three times a week  . Frequency of Social Gatherings with Friends and Family: More than three times a week  . Attends Religious Services: Not on file  . Active Member of Clubs or Organizations: Not on file  . Attends Archivist Meetings: Not on file  . Marital Status: Married    Tobacco Counseling Counseling given: Not Answered Comment: reports smoking 2-3 cigarettes/ day   Clinical Intake:  Pre-visit preparation completed: Yes        Diabetes: No  How often do you need to have someone help you when you read instructions, pamphlets, or other written materials from your doctor or pharmacy?: 1 - Never       Activities of Daily Living In your present state of health, do you have any difficulty performing the following activities: 05/07/2020 12/31/2019  Hearing? N N  Vision? N N  Difficulty concentrating or making decisions? N N  Walking or climbing stairs? Y N  Comment SOBOE -  Dressing or bathing? N N  Doing errands, shopping? N N  Preparing Food and eating ? N -  Using the Toilet? N -  In the past six months, have you accidently leaked urine? N -  Do you have problems with loss of bowel control? N -  Managing your Medications? N -  Managing your Finances? N -  Housekeeping or managing your Housekeeping? N -  Some recent data might be hidden    Patient Care Team: [name], MD as PCP - General (Internal Medicine) Isaias Cowman, MD as Consulting Physician (Cardiology) Knox Royalty, RN as [place] Management De [place], [facility] as  Pharmacist (Pharmacist) Edrick Kins, MD as Rounding Team (Internal Medicine)  Indicate  any recent Medical Services you may have received from other than [facility] providers in the past year (date may be approximate).     Assessment:   This is a routine wellness examination for Elizette.  I connected with Zaylie today by telephone and verified that I am speaking with the correct person using two identifiers. Location patient: home Location provider: work Persons participating in the virtual visit: patient, Marine scientist.    I discussed the limitations, risks, security and privacy concerns of performing an evaluation and management service by telephone and the availability of in person appointments. The patient expressed understanding and verbally consented to this telephonic visit.    Interactive audio and video telecommunications were attempted between this provider and patient, however failed, due to patient having technical difficulties OR patient did not have access to video capability.  We continued and completed visit with audio only.  Some vital signs may be absent or patient reported.   Hearing/Vision screen  Hearing Screening   125Hz  250Hz  500Hz  1000Hz  2000Hz  3000Hz  4000Hz  6000Hz  8000Hz   Right ear:           Left ear:           Comments: Patient is able to hear conversational tones without difficulty.  No issues reported.  Vision Screening Comments: Wears reader lenses Visual acuity not assessed, virtual visit.      Dietary issues and exercise activities discussed: Current Exercise Habits: Home exercise routine, Type of exercise: walking, Intensity: MildLow sodium, heart healthy Good water intake  Goals      Patient Stated   .  "I want to stay out of the hospital" (pt-stated)      [place] (see longtitudinal plan of care for additional care plan information)  Current Barriers:  . Polypharmacy; complex patient with multiple comorbidities including HFrEF, COPD,  CAD (hx NSTEMI, s/p CABG x3 and mitral valve repair in 2007), continued severe mitral valve regurgitation; barrett's esophagus, depression . Looking forward to a camping trip with her grandson next weel . Continues to follow w/ [facility]. Reports that her medications are numerous, which gets expensive.  Ladon Applebaum new "numbness, and pins and needles pricking" in her feet, specifically at night. Notes that she has been taking colchicine PRN to see if that helps. Denies benefit.  . Self-manages medications by filling a BID pill box. Has Medicare Extra Help- all brand medications $9.20 and generic $3.70 for 30 or 90 day supply o COPD: Tx Trelegy daily + albuterol HFA PRN or Duonebs PRN, notes she has needed nebs twice since milrenone drip was d/c o CHF: HFrEF/CAD/mitral valve; managed on Entresto 49/51 BID, spironolactone 25 mg BID, Entresto 24/26 mg BID (recently reduced, notes [place] filled the wrong dose and she is planning on calling to get the correct dose filled; torsemide 20 mg daily, occasionally evening dose if needed.  o Insomnia: notes taking trazodone 50 mg QPM, alprazolam 0.25 mg QPM, and diphenhydramine 25 mg QPM for sleep.  o Hx tobacco abuse: patient has been TOBACCO FREE since 06/19/2019. [name]   Pharmacist Clinical Goal(s):  [name] Over the next 90 days, patient will work with PharmD and provider towards optimized medication management  Interventions: . Comprehensive medication review performed, medication list updated in electronic medical record . Inter-disciplinary care team collaboration (see longitudinal plan of care) . Encouraged patient to schedule f/u appointment w/ Dr. Derrel Nip for chronic disease state management, but also encouraged to discuss the potential symptoms of peripheral neuropathy with her. Explained  that colchicine unlikely to provide benefit in peripheral neuropathy, and there are other options likely more beneficial.  . Will fax updated medication list to [facility]. Encouraged patient to call the pharmacy to review current doses.  . Discussed [employer] Heart Failure grant funding. Provided patient w/ phone number to call to apply for funding assistance for Entresto, spironolactone, potassium, and torsemide. She will call me to let me know if she was approved. Educated to take billing information to [facility], once received.   Patient Self Care Activities:  . Patient will take medications as prescribed . Patient will attend follow up appointments as scheduled.   Please see past updates related to this goal by clicking on the "Past Updates" button in the selected goal        Depression Screen PHQ 2/9 Scores 05/07/2020 01/09/2020 12/25/2019 12/16/2019 09/03/2019 06/04/2019 05/07/2019  PHQ - 2 Score 0 0 0 0 0 0 0  PHQ- 9 Score - - 1 - 1 - -    Fall Risk Fall Risk  05/07/2020 12/25/2019 12/12/2019 12/01/2019 11/19/2019  Falls in the past year? 0 0 0 0 0  Comment - - - - -  Number falls in past yr: 0 - - 0 -  Injury with Fall? - - - 0 -  Comment - - - - -  Risk for fall due to : - - - - -  Risk for fall due to: Comment - - - - -  Follow up Falls evaluation completed Falls evaluation completed Falls evaluation completed Falls evaluation completed Falls evaluation completed    Handrails in use when climbing stairs? Yes Home free of loose throw rugs in walkways, pet beds, electrical cords, etc? Yes  Adequate lighting in your home to reduce risk of falls? Yes   ASSISTIVE DEVICES UTILIZED TO PREVENT FALLS:  Life alert? No  Use of a cane, walker or w/c? No  Grab bars in the bathroom? No  Shower chair or bench in shower? Yes  Elevated toilet seat or a handicapped toilet? No   TIMED UP AND GO: Was the test performed? No . Virtual visit.   Cognitive Function: MMSE - Mini Mental State Exam 05/06/2018 09/13/2016  Orientation to time 5 5  Orientation to Place 5 5  Registration 3 3  Attention/ Calculation 5 5  Recall 3 3  Language- name 2 objects 2 2   Language- repeat 1 1  Language- follow 3 step command 3 3  Language- read & follow direction 1 1  Write a sentence 1 1  Copy design 1 1  Total score 30 30     6CIT Screen 05/07/2020 05/07/2019 09/13/2016  What Year? 0 points 0 points 0 points  What month? 0 points 0 points 0 points  What time? - 0 points 0 points  Count back from 20 - 0 points 0 points  Months in reverse 0 points 0 points 0 points  Repeat phrase 0 points - -    Immunizations Immunization History  Administered Date(s) Administered  . Fluad Quad(high Dose 65+) 09/23/2019  . Influenza Split 08/20/2014  . Influenza,inj,Quad PF,6+ Mos 06/28/2016, 07/29/2018  . Pneumococcal Polysaccharide-23 08/20/2014, 06/21/2019    TDAP status: Due, Education has been provided regarding the importance of this vaccine. Advised may receive this vaccine at local pharmacy or Health Dept. Aware to provide a copy of the vaccination record if obtained from local pharmacy or Health Dept. Verbalized acceptance and understanding.  Covid vaccine- deferred  due to patient preference.  Zostavax completed No   Shingrix Completed?: No.    Education has been provided regarding the importance of this vaccine. Patient has been advised to call insurance company to determine out of pocket expense if they have not yet received this vaccine. Advised may also receive vaccine at local pharmacy or Health Dept. Verbalized acceptance and understanding.   Health Maintenance  Health Maintenance  Topic Date Due  . MAMMOGRAM  12/14/2018  . COVID-19 Vaccine (1) 05/23/2020 (Originally 04/26/1963)  . DEXA SCAN  05/07/2021 (Originally 04/26/2018)  . TETANUS/TDAP  05/07/2021 (Originally 04/26/1970)  . INFLUENZA VACCINE  06/06/2020  . PNA vac Low Risk Adult (2 of 2 - PCV13) 06/20/2020  . COLONOSCOPY  07/25/2023  . Hepatitis C Screening  Completed      Health Maintenance Due  Topic Date Due  . MAMMOGRAM  12/14/2018    Mammogram orderd   Dexa Scan-  declined  Dental Screening: Recommended annual dental exams for proper oral hygiene.  Community Resource Referral / Chronic Care Management: CRR required this visit?  No   CCM required this visit?  No     Plan:   Keep all routine maintenance appointments.   Follow up 06/24/20 @ 1:00 CCM telephone visit  I have personally reviewed and noted the following in the patient's chart:   . Medical and social history . Use of alcohol, tobacco or illicit drugs  . Current medications and supplements . Functional ability and status . Nutritional status . Physical activity . Advanced directives . List of other physicians . Hospitalizations, surgeries, and ER visits in previous 12 months . Vitals . Screenings to include cognitive, depression, and falls . Referrals and appointments  In addition, I have reviewed and discussed with patient certain preventive protocols, quality metrics, and best practice recommendations. A written personalized care plan for preventive services as well as general preventive health recommendations were provided to patient via mychart.     OBrien-Blaney, Everson Mott L, LPN   03/09/6502     I have reviewed the above information and agree with above.   Deborra Medina, MD

## 2020-05-07 NOTE — Patient Instructions (Addendum)
Ashley [name] , Thank you for taking time to come for your Medicare Wellness Visit. I appreciate your ongoing commitment to your health goals. Please review the following plan we discussed and let me know if I can assist you in the future.   These are the goals we discussed: Goals      Patient Stated   .  "I want to stay out of the hospital" (pt-stated)      [place] (see longtitudinal plan of care for additional care plan information)  Current Barriers:  . Polypharmacy; complex patient with multiple comorbidities including HFrEF, COPD, CAD (hx NSTEMI, s/p CABG x3 and mitral valve repair in 2007), continued severe mitral valve regurgitation; barrett's esophagus, depression . Looking forward to a camping trip with her grandson next weel . Continues to follow w/ [facility]. Reports that her medications are numerous, which gets expensive.  Ladon Applebaum new "numbness, and pins and needles pricking" in her feet, specifically at night. Notes that she has been taking colchicine PRN to see if that helps. Denies benefit.  . Self-manages medications by filling a BID pill box. Has Medicare Extra Help- all brand medications $9.20 and generic $3.70 for 30 or 90 day supply o COPD: Tx Trelegy daily + albuterol HFA PRN or Duonebs PRN, notes she has needed nebs twice since milrenone drip was d/c o CHF: HFrEF/CAD/mitral valve; managed on Entresto 49/51 BID, spironolactone 25 mg BID, Entresto 24/26 mg BID (recently reduced, notes [facility] filled the wrong dose and she is planning on calling to get the correct dose filled; torsemide 20 mg daily, occasionally evening dose if needed.  o Insomnia: notes taking trazodone 50 mg QPM, alprazolam 0.25 mg QPM, and diphenhydramine 25 mg QPM for sleep.  o Hx tobacco abuse: patient has been TOBACCO FREE since 06/19/2019. [name]   Pharmacist Clinical Goal(s):  [name] Over the next 90 days, patient will work with PharmD and provider towards optimized medication  management  Interventions: . Comprehensive medication review performed, medication list updated in electronic medical record . Inter-disciplinary care team collaboration (see longitudinal plan of care) . Encouraged patient to schedule f/u appointment w/ Dr. Derrel Nip for chronic disease state management, but also encouraged to discuss the potential symptoms of peripheral neuropathy with her. Explained that colchicine unlikely to provide benefit in peripheral neuropathy, and there are other options likely more beneficial.  . Will fax updated medication list to [facility]. Encouraged patient to call the pharmacy to review current doses.  . Discussed [employer] Heart Failure grant funding. Provided patient w/ phone number to call to apply for funding assistance for Entresto, spironolactone, potassium, and torsemide. She will call me to let me know if she was approved. Educated to take billing information to [facility], once received.   Patient Self Care Activities:  . Patient will take medications as prescribed . Patient will attend follow up appointments as scheduled.   Please see past updates related to this goal by clicking on the "Past Updates" button in the selected goal         This is a list of the screening recommended for you and due dates:  Health Maintenance  Topic Date Due  . Mammogram  12/14/2018  . COVID-19 Vaccine (1) 05/23/2020*  . DEXA scan (bone density measurement)  05/07/2021*  . Tetanus Vaccine  05/07/2021*  . Flu Shot  06/06/2020  . Pneumonia vaccines (2 of 2 - PCV13) 06/20/2020  . Colon Cancer Screening  07/25/2023  .  Hepatitis C: One time screening is recommended by Center for Disease Control  (CDC) for  adults born from [birth year] through 1963.   Completed  *Topic was postponed. The date shown is not the original due date.    Immunizations Immunization History  Administered Date(s) Administered  . Fluad Quad(high Dose 65+) 09/23/2019  . Influenza Split  08/20/2014  . Influenza,inj,Quad PF,6+ Mos 06/28/2016, 07/29/2018  . Pneumococcal Polysaccharide-23 08/20/2014, 06/21/2019     Advanced directives: yes on file  Conditions/risks identified: none new  Follow up in one year for your annual wellness visit    Preventive Care 65 Years and Older, Female Preventive care refers to lifestyle choices and visits with your health care provider that can promote health and wellness. What does preventive care include?  A yearly physical exam. This is also called an annual well check.  Dental exams once or twice a year.  Routine eye exams. Ask your health care provider how often you should have your eyes checked.  Personal lifestyle choices, including:  Daily care of your teeth and gums.  Regular physical activity.  Eating a healthy diet.  Avoiding tobacco and drug use.  Limiting alcohol use.  Practicing safe sex.  Taking low-dose aspirin every day.  Taking vitamin and mineral supplements as recommended by your health care provider. What happens during an annual well check? The services and screenings done by your health care provider during your annual well check will depend on your age, overall health, lifestyle risk factors, and family history of disease. Counseling  Your health care provider may ask you questions about your:  Alcohol use.  Tobacco use.  Drug use.  Emotional well-being.  Home and relationship well-being.  Sexual activity.  Eating habits.  History of falls.  Memory and ability to understand (cognition).  Work and work Statistician.  Reproductive health. Screening  You may have the following tests or measurements:  Height, weight, and BMI.  Blood pressure.  Lipid and cholesterol levels. These may be checked every 5 years, or more frequently if you are over 73 years old.  Skin check.  Lung cancer screening. You may have this screening every year starting at age 73 if you have a 30-pack-year  history of smoking and currently smoke or have quit within the past 15 years.  Fecal occult blood test (FOBT) of the stool. You may have this test every year starting at age 16.  Flexible sigmoidoscopy or colonoscopy. You may have a sigmoidoscopy every 5 years or a colonoscopy every 10 years starting at age 57.  Hepatitis C blood test.  Hepatitis B blood test.  Sexually transmitted disease (STD) testing.  Diabetes screening. This is done by checking your blood sugar (glucose) after you have not eaten for a while (fasting). You may have this done every 1-3 years.  Bone density scan. This is done to screen for osteoporosis. You may have this done starting at age 31.  Mammogram. This may be done every 1-2 years. Talk to your health care provider about how often you should have regular mammograms. Talk with your health care provider about your test results, treatment options, and if necessary, the need for more tests. Vaccines  Your health care provider may recommend certain vaccines, such as:  Influenza vaccine. This is recommended every year.  Tetanus, diphtheria, and acellular pertussis (Tdap, Td) vaccine. You may need a Td booster every 10 years.  Zoster vaccine. You may need this after age 44.  Pneumococcal 13-valent conjugate (PCV13) vaccine.  One dose is recommended after age 19.  Pneumococcal polysaccharide (PPSV23) vaccine. One dose is recommended after age 60. Talk to your health care provider about which screenings and vaccines you need and how often you need them. This information is not intended to replace advice given to you by your health care provider. Make sure you discuss any questions you have with your health care provider. Document Released: 11/19/2015 Document Revised: 07/12/2016 Document Reviewed: 08/24/2015 Elsevier Interactive Patient Education  2017 [place] Prevention in the Home Falls can cause injuries. They can happen to people of all ages.  There are many things you can do to make your home safe and to help prevent falls. What can I do on the outside of my home?  Regularly fix the edges of walkways and driveways and fix any cracks.  Remove anything that might make you trip as you walk through a door, such as a raised step or threshold.  Trim any bushes or trees on the path to your home.  Use bright outdoor lighting.  Clear any walking paths of anything that might make someone trip, such as rocks or tools.  Regularly check to see if handrails are loose or broken. Make sure that both sides of any steps have handrails.  Any raised decks and porches should have guardrails on the edges.  Have any leaves, snow, or ice cleared regularly.  Use sand or salt on walking paths during winter.  Clean up any spills in your garage right away. This includes oil or grease spills. What can I do in the bathroom?  Use night lights.  Install grab bars by the toilet and in the tub and shower. Do not use towel bars as grab bars.  Use non-skid mats or decals in the tub or shower.  If you need to sit down in the shower, use a plastic, non-slip stool.  Keep the floor dry. Clean up any water that spills on the floor as soon as it happens.  Remove soap buildup in the tub or shower regularly.  Attach bath mats securely with double-sided non-slip rug tape.  Do not have throw rugs and other things on the floor that can make you trip. What can I do in the bedroom?  Use night lights.  Make sure that you have a light by your bed that is easy to reach.  Do not use any sheets or blankets that are too big for your bed. They should not hang down onto the floor.  Have a firm chair that has side arms. You can use this for support while you get dressed.  Do not have throw rugs and other things on the floor that can make you trip. What can I do in the kitchen?  Clean up any spills right away.  Avoid walking on wet floors.  Keep items that  you use a lot in easy-to-reach places.  If you need to reach something above you, use a strong step stool that has a grab bar.  Keep electrical cords out of the way.  Do not use floor polish or wax that makes floors slippery. If you must use wax, use non-skid floor wax.  Do not have throw rugs and other things on the floor that can make you trip. What can I do with my stairs?  Do not leave any items on the stairs.  Make sure that there are handrails on both sides of the stairs and use them. Fix handrails that are broken  or loose. Make sure that handrails are as long as the stairways.  Check any carpeting to make sure that it is firmly attached to the stairs. Fix any carpet that is loose or worn.  Avoid having throw rugs at the top or bottom of the stairs. If you do have throw rugs, attach them to the floor with carpet tape.  Make sure that you have a light switch at the top of the stairs and the bottom of the stairs. If you do not have them, ask someone to add them for you. What else can I do to help prevent falls?  Wear shoes that:  Do not have high heels.  Have rubber bottoms.  Are comfortable and fit you well.  Are closed at the toe. Do not wear sandals.  If you use a stepladder:  Make sure that it is fully opened. Do not climb a closed stepladder.  Make sure that both sides of the stepladder are locked into place.  Ask someone to hold it for you, if possible.  Clearly mark and make sure that you can see:  Any grab bars or handrails.  First and last steps.  Where the edge of each step is.  Use tools that help you move around (mobility aids) if they are needed. These include:  Canes.  Walkers.  Scooters.  Crutches.  Turn on the lights when you go into a dark area. Replace any light bulbs as soon as they burn out.  Set up your furniture so you have a clear path. Avoid moving your furniture around.  If any of your floors are uneven, fix them.  If there  are any pets around you, be aware of where they are.  Review your medicines with your doctor. Some medicines can make you feel dizzy. This can increase your chance of falling. Ask your doctor what other things that you can do to help prevent falls. This information is not intended to replace advice given to you by your health care provider. Make sure you discuss any questions you have with your health care provider. Document Released: 08/19/2009 Document Revised: 03/30/2016 Document Reviewed: 11/27/2014 Elsevier Interactive Patient Education  2017 [place] A mammogram is a low energy X-ray of the breasts that is done to check for abnormal changes. This procedure can screen for and detect any changes that may indicate breast cancer. Mammograms are regularly done on women. A man may have a mammogram if he has a lump or swelling in his breast. A mammogram can also identify other changes and variations in the breast, such as:  Inflammation of the breast tissue (mastitis).  An infected area that contains a collection of pus (abscess).  A fluid-filled sac (cyst).  Fibrocystic changes. This is when breast tissue becomes denser, which can make the tissue feel rope-like or uneven under the skin.  Tumors that are not cancerous (benign). Tell a health care provider:  About any allergies you have.  If you have breast implants.  If you have had previous breast disease, biopsy, or surgery.  If you are breastfeeding.  If you are younger than age 56.  If you have a family history of breast cancer.  Whether you are pregnant or may be pregnant. What are the risks? Generally, this is a safe procedure. However, problems may occur, including:  Exposure to radiation. Radiation levels are very low with this test.  The results being misinterpreted.  The need for further tests.  The inability of  the mammogram to detect certain cancers. What happens before the  procedure?  Schedule your test about 1-2 weeks after your menstrual period if you are still menstruating. This is usually when your breasts are the least tender.  If you have had a mammogram done at a different facility in the past, get the mammogram X-rays or have them sent to your current exam facility. The new and old images will be compared.  Wash your breasts and underarms on the day of the test.  Do not wear deodorants, perfumes, lotions, or powders anywhere on your body on the day of the test.  Remove any jewelry from your neck.  Wear clothes that you can change into and out of easily. What happens during the procedure?   You will undress from the waist up and put on a gown that opens in the front.  You will stand in front of the X-ray machine.  Each breast will be placed between two plastic or glass plates. The plates will compress your breast for a few seconds. Try to stay as relaxed as possible during the procedure. This does not cause any harm to your breasts and any discomfort you feel will be very brief.  X-rays will be taken from different angles of each breast. The procedure may vary among health care providers and hospitals. What happens after the procedure?  The mammogram will be examined by a specialist (radiologist).  You may need to repeat certain parts of the test, depending on the quality of the images. This is commonly done if the radiologist needs a better view of the breast tissue.  You may resume your normal activities.  It is up to you to get the results of your procedure. Ask your health care provider, or the department that is doing the procedure, when your results will be ready. Summary  A mammogram is a low energy X-ray of the breasts that is done to check for abnormal changes. A man may have a mammogram if he has a lump or swelling in his breast.  If you have had a mammogram done at a different facility in the past, get the mammogram X-rays or have  them sent to your current exam facility in order to compare them.  Schedule your test about 1-2 weeks after your menstrual period if you are still menstruating.  For this test, each breast will be placed between two plastic or glass plates. The plates will compress your breast for a few seconds.  Ask when your test results will be ready. Make sure you get your test results. This information is not intended to replace advice given to you by your health care provider. Make sure you discuss any questions you have with your health care provider. Document Revised: 06/13/2018 Document Reviewed: 06/13/2018 Elsevier Patient Education  [place].

## 2020-05-17 ENCOUNTER — Encounter: Payer: Self-pay | Admitting: *Deleted

## 2020-05-17 ENCOUNTER — Other Ambulatory Visit: Payer: Self-pay | Admitting: *Deleted

## 2020-05-17 NOTE — Patient Outreach (Signed)
[facility]) Care Management [facility] CM Telephone Outreach Post-hospital discharge day # 130 without unplanned hospital re-admission  05/17/2020  [name] 1951/09/26 [id number]  Successful telephone outreach toDeborah Loyal [name], 69 y/o femaleor[place]referred [place] by [facility] Liaison RN CM afterhospitalization July 21-25, 2061for acute on chronic CHF exacerbation.Patient has history including, but not limited to, combined CHF; CAD with previous MI/ CABG, and ICD; HTN/ HLD; GERD; COPD;history oftobacco use-patient has quit smoking since June 19, 2019.  Patient has had multiple hospital re-admissions over the last year, most within 30 days of one another; all related to acute respiratory failure due to CHF exacerbation.  Most recent hospitalizations include:  January 02, 2020- January 08, 2020 at [facility]: patient was admitted to [facility] heart failure unit/ ICU after attending scheduled cardiothoracic surgeon provider office visit; patient was evaluated for LVAD vs. Heart Transplant vs. MVR revision; patient was discharged home to self-care on continuous milrinone gtt infusionvia [facility] for infusion RN.  Today, patient again reports, "doinggreat;" she denies clinical concerns, pain, new/ recent falls and sounds to be in no distress throughout phone call today.  We discussed that she has successfully been out of the hospital for unplanned hospital admissions for over 4 consecutive months, and positive reinforcement was provided.  Patient reports she has been able to go camping and work in her vegetable garden over the last few weeks.  She reports has attended all scheduled provider appointments.  No recent changes to medications and medication review was completed with patient today; she continues to self-manage using pill box; no discrepancies or concerns noted from review of medications.  Remains independent in ADL's  and requires assistance with only driving for iADL's-- fiancee continues to drive patient to all appointments/ errands.  Patient scheduled to obtain corona virus vaccine this afternoon.  Continues following established plan of care at home for monitoring/ recording daily weights and clinical status; continues following heart healthy diet.  Continues able to independently verbalize appropriate/ accurate plan of care for development of signs/ symptoms yellow CHF zone.  Consistent weights at home range from 164-166 lbs, with weight this morning of 165.4 lbs  ---- confirms that general plan for concerns/ issues/ problems that acutely arise is(still)to  1) follow established CHF action plan 2) contact [place] HF providers promptly and early for any concerns/ changes 3) report to the local ED immediately should she need emergent care, with transfer to [facility] should admission be required   Patient is very happy about her progress and her "new life" now that she has stabilized.  Encouraged her to continue established plan of care at home and she verbalizes commitment to same.  Patient denies further issues, concerns, or problems today. I confirmed that patient hasmy direct phone number, the main [place] office phone number, and the [facility] CM 24-hour nurse advice phone number should issues arise prior to next scheduled [facility] CM outreach in 6 weeks, as we discussed that she has stabilized so well that she no longer requires monthly or more frequent outreach.  Continues followed by cardiology team at [facility] regularly, next appointment scheduled in early August.  Encouraged patient to contact me directly if needs, questions, issues, or concerns arise prior to next scheduled outreach; patient agreed to do so.  Plan:  Patient will take medications as prescribed and will attend all scheduled provider appointments  Patient will promptly notify care providers for any new concerns/ issues/ problems that  arise  Patient  will continue monitoring/ recording daily weightsand following established action plan for CHF zones  I will share today's [employer] CM note with her PCP as quarterly update summary  [place] outreach to continue with scheduled phone callin 6 weeks, sooner if indicated  [name], RN, BSN, [employer] Coordinator [facility] Care Management  [phone]

## 2020-05-27 ENCOUNTER — Other Ambulatory Visit: Payer: Self-pay | Admitting: Internal Medicine

## 2020-05-27 NOTE — Telephone Encounter (Signed)
Refilled: 01/15/2020 Last OV: 12/25/2019 Next OV: not scheduled

## 2020-06-09 DIAGNOSIS — I255 Ischemic cardiomyopathy: Secondary | ICD-10-CM | POA: Diagnosis not present

## 2020-06-09 DIAGNOSIS — Z95810 Presence of automatic (implantable) cardiac defibrillator: Secondary | ICD-10-CM | POA: Diagnosis not present

## 2020-06-09 DIAGNOSIS — Z953 Presence of xenogenic heart valve: Secondary | ICD-10-CM | POA: Diagnosis not present

## 2020-06-09 DIAGNOSIS — I2510 Atherosclerotic heart disease of native coronary artery without angina pectoris: Secondary | ICD-10-CM | POA: Diagnosis not present

## 2020-06-09 DIAGNOSIS — N183 Chronic kidney disease, stage 3 unspecified: Secondary | ICD-10-CM | POA: Diagnosis not present

## 2020-06-09 DIAGNOSIS — I5022 Chronic systolic (congestive) heart failure: Secondary | ICD-10-CM | POA: Diagnosis not present

## 2020-06-09 DIAGNOSIS — I10 Essential (primary) hypertension: Secondary | ICD-10-CM | POA: Diagnosis not present

## 2020-06-09 LAB — BASIC METABOLIC PANEL
BUN: 24 — AB (ref 4–21)
CO2: 23 — AB (ref 13–22)
Chloride: 101 (ref 99–108)
Creatinine: 1.6 — AB (ref 0.5–1.1)
Glucose: 128
Potassium: 4.5 (ref 3.4–5.3)
Sodium: 136 — AB (ref 137–147)

## 2020-06-09 LAB — COMPREHENSIVE METABOLIC PANEL: Calcium: 9.7 (ref 8.7–10.7)

## 2020-06-16 DIAGNOSIS — I5022 Chronic systolic (congestive) heart failure: Secondary | ICD-10-CM | POA: Diagnosis not present

## 2020-06-16 DIAGNOSIS — I255 Ischemic cardiomyopathy: Secondary | ICD-10-CM | POA: Diagnosis not present

## 2020-06-16 DIAGNOSIS — Z951 Presence of aortocoronary bypass graft: Secondary | ICD-10-CM | POA: Diagnosis not present

## 2020-06-17 ENCOUNTER — Other Ambulatory Visit: Payer: Self-pay

## 2020-06-17 ENCOUNTER — Telehealth: Payer: Self-pay | Admitting: Internal Medicine

## 2020-06-17 DIAGNOSIS — E876 Hypokalemia: Secondary | ICD-10-CM

## 2020-06-17 NOTE — Telephone Encounter (Signed)
Audley Hose, RN called from [facility] and wants to know if pt can get labs drawn here from them. BMP and magnesium. Contact number is [phone]

## 2020-06-18 NOTE — Telephone Encounter (Signed)
Noted...pt has been scheduled for a lab appt.

## 2020-06-18 NOTE — Telephone Encounter (Signed)
Is it okay to place the orders for the bmp and magnesium?

## 2020-06-18 NOTE — Telephone Encounter (Signed)
Yes,  I have ordered them.  In the  future you do not need to ask for permission  If another doctor is asking (only if a patient is asking!)

## 2020-06-21 ENCOUNTER — Other Ambulatory Visit: Payer: Self-pay

## 2020-06-21 ENCOUNTER — Other Ambulatory Visit (INDEPENDENT_AMBULATORY_CARE_PROVIDER_SITE_OTHER): Payer: Medicare Other

## 2020-06-21 DIAGNOSIS — E876 Hypokalemia: Secondary | ICD-10-CM

## 2020-06-21 LAB — BASIC METABOLIC PANEL
BUN: 31 mg/dL — ABNORMAL HIGH (ref 6–23)
CO2: 25 mEq/L (ref 19–32)
Calcium: 9.4 mg/dL (ref 8.4–10.5)
Chloride: 101 mEq/L (ref 96–112)
Creatinine, Ser: 1.77 mg/dL — ABNORMAL HIGH (ref 0.40–1.20)
GFR: 28.6 mL/min — ABNORMAL LOW (ref >60.00)
Glucose, Bld: 122 mg/dL — ABNORMAL HIGH (ref 70–99)
Potassium: 5 mEq/L (ref 3.5–5.1)
Sodium: 137 mEq/L (ref 135–145)

## 2020-06-21 LAB — MAGNESIUM: Magnesium: 2.1 mg/dL (ref 1.5–2.5)

## 2020-06-23 ENCOUNTER — Telehealth: Payer: Self-pay | Admitting: Internal Medicine

## 2020-06-23 NOTE — Telephone Encounter (Signed)
Labs have been faxed.

## 2020-06-23 NOTE — Telephone Encounter (Signed)
RN with [facility] called want to know if the results from patient's BMP could be sent Dr. Doyne Keel  At fax number [phone]

## 2020-06-24 ENCOUNTER — Ambulatory Visit (INDEPENDENT_AMBULATORY_CARE_PROVIDER_SITE_OTHER): Payer: Medicare Other | Admitting: Pharmacist

## 2020-06-24 DIAGNOSIS — I70213 Atherosclerosis of native arteries of extremities with intermittent claudication, bilateral legs: Secondary | ICD-10-CM

## 2020-06-24 DIAGNOSIS — I25700 Atherosclerosis of coronary artery bypass graft(s), unspecified, with unstable angina pectoris: Secondary | ICD-10-CM | POA: Diagnosis not present

## 2020-06-24 NOTE — Chronic Care Management (AMB) (Signed)
Chronic Care Management   Follow Up Note   06/24/2020 Name: Ashley [name] MRN: [id number] DOB: 11-20-1950  Referred by: [name], MD Reason for referral : Chronic Care Management (Medication Management)   Ashley [name] is a 69 y.o. year old female who is a primary care patient of [name], [name], MD. The CCM team was consulted for assistance with chronic disease management and care coordination needs.    Contacted patient for medication management review.   Review of patient status, including review of consultants reports, relevant laboratory and other test results, and collaboration with appropriate care team members and the patient's provider was performed as part of comprehensive patient evaluation and provision of chronic care management services.    SDOH (Social Determinants of Health) assessments performed: Yes See Care Plan activities for detailed interventions related to SDOH)  SDOH Interventions     Most Recent Value  SDOH Interventions  SDOH Interventions for the Following Domains Tobacco  Financial Strain Interventions Intervention Not Indicated  Tobacco Interventions Intervention Not Indicated       Outpatient Encounter Medications as of 06/24/2020  Medication Sig Note   acetaminophen (TYLENOL) 325 MG tablet Take 650 mg by mouth every 6 (six) hours as needed.    albuterol (VENTOLIN HFA) 108 (90 Base) MCG/ACT inhaler INHALE 2 PUFFS BY MOUTH INTO THE LUNGS EVERY 3 HOURS AS NEEDED FOR WHEEZING 06/24/2020: Using ~ daily lately   allopurinol (ZYLOPRIM) 100 MG tablet Take 1 tablet (100 mg total) by mouth daily.    ALPRAZolam (XANAX) 0.5 MG tablet TAKE 1/2 TO 1 TABLET BY MOUTH AT BEDTIME AS NEEDED FOR ANXIETY    aspirin 81 MG tablet Take 81 mg by mouth daily.    atorvastatin (LIPITOR) 40 MG tablet TAKE ONE TABLET BY MOUTH ONCE DAILY    clopidogrel (PLAVIX) 75 MG tablet Take 1 tablet (75 mg total) by mouth daily.    cyanocobalamin (,VITAMIN B-12,) 1000  MCG/ML injection Inject 1 mL into the muscle monthly (Patient taking differently: Inject 1,000 mcg into the muscle every 30 (thirty) days. )    diphenhydrAMINE (DIPHENHIST) 25 mg capsule Take 25 mg by mouth at bedtime as needed for allergies or sleep.     escitalopram (LEXAPRO) 10 MG tablet Take 1 tablet (10 mg total) by mouth daily.    ferrous sulfate 325 (65 FE) MG EC tablet Take 325 mg by mouth daily with breakfast.    Fluticasone-Umeclidin-Vilant (TRELEGY ELLIPTA) 100-62.5-25 MCG/INH AEPB Inhale 1 puff into the lungs daily.    Magnesium Oxide 400 (240 Mg) MG TABS Take 1 tablet by mouth daily.    metFORMIN (GLUCOPHAGE-XR) 500 MG 24 hr tablet Take 2 tablets (1,000 mg total) by mouth every morning.    omeprazole (PRILOSEC) 40 MG capsule TAKE ONE CAPSULE BY MOUTH ONCE DAILY    sacubitril-valsartan (ENTRESTO) 24-26 MG Take 1 tablet by mouth in the morning and at bedtime.    spironolactone (ALDACTONE) 25 MG tablet Take 25 mg by mouth daily.    torsemide (DEMADEX) 20 MG tablet Take 20 mg by mouth in the morning and at bedtime.     traZODone (DESYREL) 50 MG tablet Take 0.5-1 tablets (25-50 mg total) by mouth daily. One hour before bedtime (Patient taking differently: Take 50 mg by mouth daily. One hour before bedtime)    colchicine 0.6 MG tablet TAKE TWO TABLETS BY MOUTH AT FIRST SIGN OF GOUT FLARE FOLLOW BY 1 TABLET DAILY UNTIL GOUT FLARE RESOLVES (Patient not taking: Reported  on 06/24/2020)    ipratropium-albuterol (DUONEB) 0.5-2.5 (3) MG/3ML SOLN USE 1 VIAL VIA NEBULIZER EVERY 6 HOURS AS NEEDED (Patient not taking: Reported on 06/24/2020)    [DISCONTINUED] potassium chloride (KLOR-CON) 10 MEQ tablet Take 2 tablets (20 mEq total) by mouth daily. 04/22/2020: Only taking 10 mEq   No facility-administered encounter medications on file as of 06/24/2020.     Objective:   Goals Addressed              This Visit's Progress     Patient Stated     PharmD "I want to stay out of the  hospital" (pt-stated)        CARE PLAN ENTRY (see longtitudinal plan of care for additional care plan information)  Current Barriers:   Social, financial, and community barriers:  o Denies any cost concerns at this time. Never pursued [employer] funding for Heart Failure, but the fund is now closed.  o Patient has Full Medicare Extra Help: Generic prescriptions are $3.70 for 30 or 90 day supply; brand prescriptions are $9.20 for 30 or 90 day supply  Polypharmacy; complex patient with multiple comorbidities including HFrEF, COPD, CAD (hx NSTEMI, s/p CABG x3 and mitral valve repair in 2007), continued severe mitral valve regurgitation; barrett's esophagus, depression  Self-manages medications by filling a BID pill box.  o CHF: HFrEF/CAD/mitral valve; follows w/ Dr. Saralyn Pilar as primary and Dr. Doyne Keel as CHF specialist; Delene Loll 24/26 BID, spironolactone 25 mg BID, torsemide 20 BID, magnesium 400 mg daily - Daily weights remaining ~163 lbs - Daily BP remaining 80-130/60s o ASCVD risk reduction (hx NSTEMI, CABG): clopidogrel 75 mg daily, aspirin 81 mg daily. Unspecified duration of DAPT. Denies concerns w/ bleeding or bruising o COPD: Tx Trelegy daily + albuterol HFA PRN, using ~ daily over the past few weeks  o Insomnia: notes taking trazodone 50 mg QPM, alprazolam 0.25 mg QPM, and diphenhydramine 25 mg QPM for sleep o Hx tobacco abuse: patient has been TOBACCO FREE since 06/19/2019. o Continues to report burning/sandpaper-like pain in her feet which impacts sleep.   Pharmacist Clinical Goal(s):   Over the next 90 days, patient will work with PharmD and provider towards optimized medication management  Interventions:  Comprehensive medication review performed, medication list updated in electronic medical record  Inter-disciplinary care team collaboration (see longitudinal plan of care)  Will collaborate w/ clinic staff to call patient to schedule f/u with Dr. Derrel Nip for chronic  medication management and specifically burning foot pain  Contacted Dr. Saralyn Pilar' office for his input regarding DAPT duration. LVM for his input  Praised for continued daily documentation of BP / weight and collaboration w/ HF team.   Patient Self Care Activities:   Patient will take medications as prescribed  Patient will attend follow up appointments as scheduled.   Please see past updates related to this goal by clicking on the "Past Updates" button in the selected goal          Plan:  - Scheduled f/u call in ~ 8 weeks  [name] Darnelle Maffucci, PharmD, [place], [place] Pharmacist [place] [phone]

## 2020-06-24 NOTE — Patient Instructions (Signed)
Visit Information  Goals Addressed              This Visit's Progress     Patient Stated   .  PharmD "I want to stay out of the hospital" (pt-stated)        [place] (see longtitudinal plan of care for additional care plan information)  Current Barriers:  . Social, financial, and community barriers:  o Denies any cost concerns at this time. Never pursued [employer] funding for Heart Failure, but the fund is now closed.  o Patient has Full Medicare Extra Help: Generic prescriptions are $3.70 for 30 or 90 day supply; brand prescriptions are $9.20 for 30 or 90 day supply . Polypharmacy; complex patient with multiple comorbidities including HFrEF, COPD, CAD (hx NSTEMI, s/p CABG x3 and mitral valve repair in 2007), continued severe mitral valve regurgitation; barrett's esophagus, depression . Self-manages medications by filling a BID pill box.  o CHF: HFrEF/CAD/mitral valve; follows w/ Dr. Saralyn Pilar as primary and Dr. Doyne Keel as CHF specialist; Delene Loll 24/26 BID, spironolactone 25 mg BID, torsemide 20 BID, magnesium 400 mg daily - Daily weights remaining ~163 lbs - Daily BP remaining 80-130/60s o ASCVD risk reduction (hx NSTEMI, CABG): clopidogrel 75 mg daily, aspirin 81 mg daily. Unspecified duration of DAPT. Denies concerns w/ bleeding or bruising o COPD: Tx Trelegy daily + albuterol HFA PRN, using ~ daily over the past few weeks  o Insomnia: notes taking trazodone 50 mg QPM, alprazolam 0.25 mg QPM, and diphenhydramine 25 mg QPM for sleep o Hx tobacco abuse: patient has been TOBACCO FREE since 06/19/2019. o Continues to report burning/sandpaper-like pain in her feet which impacts sleep.   Pharmacist Clinical Goal(s):  [name] Over the next 90 days, patient will work with PharmD and provider towards optimized medication management  Interventions: . Comprehensive medication review performed, medication list updated in electronic medical record . Inter-disciplinary care team  collaboration (see longitudinal plan of care) . Will collaborate w/ clinic staff to call patient to schedule f/u with Dr. Derrel Nip for chronic medication management and specifically burning foot pain . Contacted Dr. Saralyn Pilar' office for his input regarding DAPT duration. LVM for his input . Praised for continued daily documentation of BP / weight and collaboration w/ HF team.   Patient Self Care Activities:  . Patient will take medications as prescribed . Patient will attend follow up appointments as scheduled.   Please see past updates related to this goal by clicking on the "Past Updates" button in the selected goal         The patient verbalized understanding of instructions provided today and declined a print copy of patient instruction materials.   Plan:  - Scheduled f/u call in ~ 8 weeks  [name] Darnelle Maffucci, PharmD, [place], [place] Pharmacist [place] [phone]

## 2020-07-05 ENCOUNTER — Other Ambulatory Visit: Payer: Self-pay

## 2020-07-05 ENCOUNTER — Encounter: Payer: Self-pay | Admitting: Internal Medicine

## 2020-07-05 ENCOUNTER — Ambulatory Visit (INDEPENDENT_AMBULATORY_CARE_PROVIDER_SITE_OTHER): Payer: Medicare Other | Admitting: Internal Medicine

## 2020-07-05 VITALS — BP 102/70 | HR 95 | Temp 97.9°F | Resp 16 | Ht 63.0 in | Wt 166.2 lb

## 2020-07-05 DIAGNOSIS — E1140 Type 2 diabetes mellitus with diabetic neuropathy, unspecified: Secondary | ICD-10-CM | POA: Diagnosis not present

## 2020-07-05 DIAGNOSIS — E1142 Type 2 diabetes mellitus with diabetic polyneuropathy: Secondary | ICD-10-CM

## 2020-07-05 DIAGNOSIS — R0683 Snoring: Secondary | ICD-10-CM | POA: Insufficient documentation

## 2020-07-05 DIAGNOSIS — R252 Cramp and spasm: Secondary | ICD-10-CM | POA: Diagnosis not present

## 2020-07-05 DIAGNOSIS — E538 Deficiency of other specified B group vitamins: Secondary | ICD-10-CM

## 2020-07-05 DIAGNOSIS — R400 Somnolence: Secondary | ICD-10-CM

## 2020-07-05 LAB — BASIC METABOLIC PANEL
BUN: 18 mg/dL (ref 6–23)
CO2: 25 mEq/L (ref 19–32)
Calcium: 9.4 mg/dL (ref 8.4–10.5)
Chloride: 101 mEq/L (ref 96–112)
Creatinine, Ser: 1.31 mg/dL — ABNORMAL HIGH (ref 0.40–1.20)
GFR: 40.47 mL/min — ABNORMAL LOW (ref >60.00)
Glucose, Bld: 104 mg/dL — ABNORMAL HIGH (ref 70–99)
Potassium: 4.3 mEq/L (ref 3.5–5.1)
Sodium: 137 mEq/L (ref 135–145)

## 2020-07-05 LAB — MAGNESIUM: Magnesium: 2 mg/dL (ref 1.5–2.5)

## 2020-07-05 LAB — VITAMIN B12: Vitamin B-12: 487 pg/mL (ref 211–911)

## 2020-07-05 LAB — HEMOGLOBIN A1C: Hgb A1c MFr Bld: 5.9 % (ref 4.6–6.5)

## 2020-07-05 LAB — MICROALBUMIN / CREATININE URINE RATIO
Creatinine,U: 21.5 mg/dL
Microalb Creat Ratio: 3.3 mg/g (ref 0.0–30.0)
Microalb, Ur: <0.7 mg/dL (ref 0.0–1.9)

## 2020-07-05 MED ORDER — AMITRIPTYLINE HCL 25 MG PO TABS: 25.0000 mg | ORAL_TABLET | Freq: Every day | ORAL | 1 refills | Status: DC

## 2020-07-05 NOTE — Progress Notes (Signed)
Subjective:  Patient ID: Ashley [name], female    DOB: April 08, 1951  Age: 69 y.o. MRN: [id number]  CC: The primary encounter diagnosis was Type 2 diabetes mellitus with diabetic neuropathy, without long-term current use of insulin ([place]). Diagnoses of B12 deficiency, 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 ([place]), Muscle cramp, Loud snoring, Daytime somnolence, and Snoring were also pertinent to this visit.  HPI Ashley [name] presents for evaluation of bilateral foot pain.   This visit occurred during the SARS-CoV-2 public health emergency.  Safety protocols were in place, including screening questions prior to the visit, additional usage of staff PPE, and extensive cleaning of exam room while observing appropriate contact time as indicated for disinfecting solutions.   . Patient has received NO  doses of the available COVID 19 vaccines due to recurrent illness on vaccine days.  Patient continues to mask when outside of the home except when walking in yard or at safe distances from others .  Patient denies any change in mood or development of unhealthy behaviors resuting from the pandemic's restriction of activities and socialization.     Neuropathy began six months ago and involved the forefoot.  burning and tingling.. now constant and worse at night .   TAKES B12 INJECTIONS MONTHLY   Sees cardiology at [facility], having cardiac cath on Wednesday.  H Having recurrent muscle cramps   Has not seen vascular in years.  Last ABI was with cardiology over 2 years ago.  . No claudication symptoms with 50 to 100 yard walk  But gets SOB with 20 yrd walk.     no prior sleep study.  Some somnolence.  Snores pretty loudly per boyfriend. .  Quit smoking one year ago.       Outpatient Medications Prior to Visit  Medication Sig Dispense Refill  . acetaminophen (TYLENOL) 325 MG tablet Take 650 mg by mouth every 6 (six) hours as needed.    [name] albuterol (VENTOLIN HFA) 108 (90  Base) MCG/ACT inhaler INHALE 2 PUFFS BY MOUTH INTO THE LUNGS EVERY 3 HOURS AS NEEDED FOR WHEEZING 54 g 2  . allopurinol (ZYLOPRIM) 100 MG tablet Take 1 tablet (100 mg total) by mouth daily. 30 tablet 5  . ALPRAZolam (XANAX) 0.5 MG tablet TAKE 1/2 TO 1 TABLET BY MOUTH AT BEDTIME AS NEEDED FOR ANXIETY 30 tablet 3  . aspirin 81 MG tablet Take 81 mg by mouth daily.    [name] atorvastatin (LIPITOR) 40 MG tablet TAKE ONE TABLET BY MOUTH ONCE DAILY 90 tablet 1  . clopidogrel (PLAVIX) 75 MG tablet Take 1 tablet (75 mg total) by mouth daily. 90 tablet 3  . colchicine 0.6 MG tablet TAKE TWO TABLETS BY MOUTH AT FIRST SIGN OF GOUT FLARE FOLLOW BY 1 TABLET DAILY UNTIL GOUT FLARE RESOLVES    . cyanocobalamin (,VITAMIN B-12,) 1000 MCG/ML injection Inject 1 mL into the muscle monthly (Patient taking differently: Inject 1,000 mcg into the muscle every 30 (thirty) days. ) 10 mL 0  . diphenhydrAMINE (DIPHENHIST) 25 mg capsule Take 25 mg by mouth at bedtime as needed for allergies or sleep.     [name] escitalopram (LEXAPRO) 10 MG tablet Take 1 tablet (10 mg total) by mouth daily. 90 tablet 1  . ferrous sulfate 325 (65 FE) MG EC tablet Take 325 mg by mouth daily with breakfast.    . Fluticasone-Umeclidin-Vilant (TRELEGY ELLIPTA) 100-62.5-25 MCG/INH AEPB Inhale 1 puff into the lungs daily. 180 each 2  .  ipratropium-albuterol (DUONEB) 0.5-2.5 (3) MG/3ML SOLN USE 1 VIAL VIA NEBULIZER EVERY 6 HOURS AS NEEDED 360 mL 1  . Magnesium Oxide 400 (240 Mg) MG TABS Take 1 tablet by mouth daily.    . metFORMIN (GLUCOPHAGE-XR) 500 MG 24 hr tablet Take 2 tablets (1,000 mg total) by mouth every morning. 180 tablet 1  . omeprazole (PRILOSEC) 40 MG capsule TAKE ONE CAPSULE BY MOUTH ONCE DAILY 90 capsule 1  . potassium chloride (KLOR-CON) 10 MEQ tablet Take 10 mEq by mouth 2 (two) times daily.    . sacubitril-valsartan (ENTRESTO) 24-26 MG Take 1 tablet by mouth in the morning and at bedtime.    [name] spironolactone (ALDACTONE) 25 MG tablet Take 25 mg  by mouth daily.    [name] torsemide (DEMADEX) 20 MG tablet Take 20 mg by mouth in the morning and at bedtime.     . traZODone (DESYREL) 50 MG tablet Take 0.5-1 tablets (25-50 mg total) by mouth daily. One hour before bedtime (Patient taking differently: Take 50 mg by mouth daily. One hour before bedtime) 90 tablet 3   No facility-administered medications prior to visit.    Review of Systems;  Patient denies headache, fevers, malaise, unintentional weight loss, skin rash, eye pain, sinus congestion and sinus pain, sore throat, dysphagia,  hemoptysis , cough, dyspnea, wheezing, chest pain, palpitations, orthopnea, edema, abdominal pain, nausea, melena, diarrhea, constipation, flank pain, dysuria, hematuria, urinary  Frequency, nocturia, numbness, tingling, seizures,  Focal weakness, Loss of consciousness,  Tremor, insomnia, depression, anxiety, and suicidal ideation.      Objective:  BP 102/70 (BP Location: Left Arm, Patient Position: Sitting, Cuff Size: Normal)   Pulse 95   Temp 97.9 F (36.6 C) (Oral)   Resp 16   Ht _0  (1.6 m)   Wt 166 lb 3.2 oz (75.4 kg)   SpO2 98%   BMI 29.44 kg/m   BP Readings from Last 3 Encounters:  07/05/20 102/70  05/07/20 140/87  01/01/20 121/73    Wt Readings from Last 3 Encounters:  07/05/20 166 lb 3.2 oz (75.4 kg)  05/07/20 169 lb (76.7 kg)  01/01/20 169 lb 12.1 oz (77 kg)    General appearance: alert, cooperative and appears stated age Ears: normal TM's and external ear canals both ears Throat: lips, mucosa, and tongue normal; teeth and gums normal Neck: no adenopathy, no carotid bruit, supple, symmetrical, trachea midline and thyroid not enlarged, symmetric, no tenderness/mass/nodules Back: symmetric, no curvature. ROM normal. No CVA tenderness. Lungs: clear to auscultation bilaterally Heart: regular rate and rhythm, S1, S2 normal, no murmur, click, rub or gallop Abdomen: soft, non-tender; bowel sounds normal; no masses,  no  organomegaly Pulses: 2+ and symmetric Skin: Skin color, texture, turgor normal. No rashes or lesions. NO HAIR ON LEGS  Lymph nodes: Cervical, supraclavicular, and axillary nodes normal.  Lab Results  Component Value Date   HGBA1C 6.7 (H) 01/01/2020   HGBA1C 6.7 (H) 12/19/2019   HGBA1C 6.7 (H) 12/18/2019    Lab Results  Component Value Date   CREATININE 1.77 (H) 06/21/2020   CREATININE 1.6 (A) 06/09/2020   CREATININE 1.40 (H) 04/21/2020    Lab Results  Component Value Date   WBC 9.2 02/04/2020   HGB 12.2 02/04/2020   HCT 36 02/04/2020   PLT 224 02/04/2020   GLUCOSE 122 (H) 06/21/2020   CHOL 155 01/01/2020   TRIG 70 01/01/2020   HDL 50 01/01/2020   LDLDIRECT 62.0 11/08/2018   LDLCALC 91 01/01/2020   ALT  15 02/04/2020   AST 16 02/04/2020   NA 137 06/21/2020   K 5.0 06/21/2020   CL 101 06/21/2020   CREATININE 1.77 (H) 06/21/2020   BUN 31 (H) 06/21/2020   CO2 25 06/21/2020   TSH 0.725 09/23/2019   INR 1.1 08/24/2019   HGBA1C 6.7 (H) 01/01/2020    DG Chest Port 1 View  Result Date: 12/31/2019 CLINICAL DATA:  Shortness of breath EXAM: PORTABLE CHEST 1 VIEW COMPARISON:  December 29, 2019 FINDINGS: There remains interstitial edema, somewhat increased from 2 days prior. No consolidation or pleural effusion evident. There is cardiomegaly with pulmonary venous hypertension. Pacemaker leads are attached to the right atrium, right ventricle, and coronary sinus. Patient is status post coronary artery bypass grafting. No adenopathy. There is aortic atherosclerosis. Postoperative changes in left clavicle. IMPRESSION: Increase in interstitial edema compared to 2 days prior. No consolidation. Persistent cardiomegaly with pulmonary venous hypertension consistent with pulmonary vascular congestion. Findings appear consistent with a degree of congestive heart failure. Postoperative changes noted.  Stable appearance of pacemaker leads. Electronically Signed   By: Lowella Grip III M.D.    On: 12/31/2019 09:17   ECHOCARDIOGRAM COMPLETE  Result Date: 12/31/2019    ECHOCARDIOGRAM REPORT   Patient Name:   TELINA KLECKLEY [facility] Date of Exam: 12/31/2019 Medical Rec #:  [id number]         Height:       63.0 in Accession #:    [id number]        Weight:       165.1 lb Date of Birth:  Jan 06, 1951         BSA:          1.782 m Patient Age:    80 years          BP:           147/98 mmHg Patient Gender: F                 HR:           103 bpm. Exam Location:  [facility] Procedure: 2D Echo, Cardiac Doppler and Color Doppler Indications:     CHF- acute diastolic 474.25  History:         Patient has prior history of Echocardiogram examinations, most                  recent 12/18/2019. CHF; Pacemaker. Mitral valve disorder, MI,                  PVD, AICD.  Sonographer:     Sherrie Sport RDCS (AE) Referring Phys:  [id number] Soledad Gerlach NIU Diagnosing Phys: Serafina Royals MD  Sonographer Comments: Technically challenging study due to limited acoustic windows. IMPRESSIONS  1. Left ventricular ejection fraction, by estimation, is 40 to 45%. The left ventricle has mildly decreased function. The left ventricle demonstrates global hypokinesis. There is moderate left ventricular hypertrophy. Left ventricular diastolic parameters are consistent with Grade I diastolic dysfunction (impaired relaxation).  2. Right ventricular systolic function is normal. The right ventricular size is normal. There is normal pulmonary artery systolic pressure.  3. Left atrial size was moderately dilated.  4. Right atrial size was mildly dilated.  5. 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 The mitral valve is abnormal. Moderate mitral valve regurgitation.  6. The aortic valve is normal in structure and function. Aortic valve regurgitation is trivial. FINDINGS  Left Ventricle: Left ventricular ejection fraction, by estimation, is 40 to  45%. The left ventricle has mildly decreased function. The left ventricle demonstrates global  hypokinesis. The left ventricular internal cavity size was normal in size. There is  moderate left ventricular hypertrophy. Left ventricular diastolic parameters are consistent with Grade I diastolic dysfunction (impaired relaxation). Right Ventricle: The right ventricular size is normal. No increase in right ventricular wall thickness. Right ventricular systolic function is normal. There is normal pulmonary artery systolic pressure. The tricuspid regurgitant velocity is 1.82 m/s, and  with an assumed right atrial pressure of 10 mmHg, the estimated right ventricular systolic pressure is 20.9 mmHg. Left Atrium: Left atrial size was moderately dilated. Right Atrium: Right atrial size was mildly dilated. Pericardium: There is no evidence of pericardial effusion. 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 The mitral valve is abnormal. Moderate mitral valve regurgitation. MV peak gradient, 25.8 mmHg. The mean mitral valve gradient is 13.0 mmHg. Tricuspid Valve: The tricuspid valve is normal in structure. Tricuspid valve regurgitation is mild. Aortic Valve: The aortic valve is normal in structure and function. Aortic valve regurgitation is trivial. Aortic valve mean gradient measures 7.4 mmHg. Aortic valve peak gradient measures 12.0 mmHg. Aortic valve area, by VTI measures 1.66 cm. Pulmonic Valve: The pulmonic valve was normal in structure. Pulmonic valve regurgitation is trivial. Aorta: The aortic root and ascending aorta are structurally normal, with no evidence of dilitation. IAS/Shunts: No atrial level shunt detected by color flow Doppler.  LEFT VENTRICLE PLAX 2D LVIDd:         4.20 cm  Diastology LVIDs:         3.08 cm  LV e' lateral:   4.03 cm/s LV PW:         1.85 cm  LV E/e' lateral: 44.2 LV IVS:        1.76 cm  LV e' medial:    2.83 cm/s LVOT diam:     2.00 cm  LV E/e' medial:  62.9 LV SV:         50 LV SV Index:   28 LVOT Area:     3.14 cm  RIGHT VENTRICLE RV Basal  diam:  3.14 cm LEFT ATRIUM              Index       RIGHT ATRIUM           Index LA diam:        5.20 cm  2.92 cm/m  RA Area:     19.00 cm LA Vol (A2C):   149.0 ml 83.59 ml/m RA Volume:   54.70 ml  30.69 ml/m LA Vol (A4C):   87.6 ml  49.15 ml/m LA Biplane Vol: 121.0 ml 67.89 ml/m  AORTIC VALVE                    PULMONIC VALVE AV Area (Vmax):    1.11 cm     PV Vmax:        0.78 m/s AV Area (Vmean):   1.09 cm     PV Peak grad:   2.4 mmHg AV Area (VTI):     1.66 cm     RVOT Peak grad: 3 mmHg AV Vmax:           173.00 cm/s AV Vmean:          123.800 cm/s AV VTI:            0.300 m AV Peak Grad:      12.0 mmHg  AV Mean Grad:      7.4 mmHg LVOT Vmax:         61.10 cm/s LVOT Vmean:        42.900 cm/s LVOT VTI:          0.159 m LVOT/AV VTI ratio: 0.53  AORTA Ao Root diam: 2.90 cm MITRAL VALVE                TRICUSPID VALVE MV Area (PHT): 2.93 cm     TR Peak grad:   13.2 mmHg MV Peak grad:  25.8 mmHg    TR Vmax:        182.00 cm/s MV Mean grad:  13.0 mmHg MV Vmax:       2.54 m/s     SHUNTS MV Vmean:      162.0 cm/s   Systemic VTI:  0.16 m MV Decel Time: 259 msec     Systemic Diam: 2.00 cm MV E velocity: 178.00 cm/s MV A velocity: 158.00 cm/s MV E/A ratio:  1.13 Serafina Royals MD Electronically signed by Serafina Royals MD Signature Date/Time: 12/31/2019/5:32:15 PM    Final     Assessment & Plan:   Problem List Items Addressed This Visit      Unprioritized   Snoring    With daytime somnolence and worsening heart failure.  ORDERING home sleep study to rule out Osa      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 ([place])    Diagnosed with a1c of 6.7 in February. TAKING METFORMIN XR 1000 MG DAILY Diet reviewed; FULL OF STARCHES  . CONTINUE METFORMIN DOSE pending repeat a1c today.  Continue lipitor and checking urine for protein.  Trial of amitrtipytline         Other Visit Diagnoses    Type 2 diabetes mellitus with diabetic neuropathy, without long-term current use of insulin ([place])     -  Primary   Relevant Orders   Hemoglobin A1c   Microalbumin / creatinine urine ratio   B12 deficiency       Relevant Orders   Vitamin B12   Methylmalonic acid(mma), rnd urine   Muscle cramp       Relevant Orders   Basic metabolic panel   Magnesium   Loud snoring       Relevant Orders   Home sleep test   Daytime somnolence       Relevant Orders   Home sleep test      I am having Harry A. Bing "Debbie" start on amitriptyline. I am also having her maintain her aspirin, diphenhydrAMINE, ferrous sulfate, Trelegy Ellipta, clopidogrel, escitalopram, traZODone, cyanocobalamin, albuterol, torsemide, spironolactone, acetaminophen, metFORMIN, ipratropium-albuterol, colchicine, Magnesium Oxide, allopurinol, omeprazole, atorvastatin, sacubitril-valsartan, ALPRAZolam, and potassium chloride.  Meds ordered this encounter  Medications  . amitriptyline (ELAVIL) 25 MG tablet    Sig: Take 1 tablet (25 mg total) by mouth at bedtime.    Dispense:  90 tablet    Refill:  1    There are no discontinued medications.  Follow-up: No follow-ups on file.   [name], MD

## 2020-07-05 NOTE — Assessment & Plan Note (Signed)
With daytime somnolence and worsening heart failure.  ORDERING home sleep study to rule out Osa

## 2020-07-05 NOTE — Patient Instructions (Addendum)
Starting generic Elavil for your neuropathy  Ordering home sleep study to evaluate you for sleep apnea  I recommend asking your cardiologist about checking the circulation in your legs  Again   Neuropathic Pain Neuropathic pain is pain caused by damage to the nerves that are responsible for certain sensations in your body (sensory nerves). The pain can be caused by:  Damage to the sensory nerves that send signals to your spinal cord and brain (peripheral nervous system).  Damage to the sensory nerves in your brain or spinal cord (central nervous system). Neuropathic pain can make you more sensitive to pain. Even a minor sensation can feel very painful. This is usually a long-term condition that can be difficult to treat. The type of pain differs from person to person. It may:  Start suddenly (acute), or it may develop slowly and last for a long time (chronic).  Come and go as damaged nerves heal, or it may stay at the same level for years.  Cause emotional distress, loss of sleep, and a lower quality of life. What are the causes? The most common cause of this condition is diabetes. Many other diseases and conditions can also cause neuropathic pain. Causes of neuropathic pain can be classified as:  Toxic. This is caused by medicines and chemicals. The most common cause of toxic neuropathic pain is damage from cancer treatments (chemotherapy).  Metabolic. This can be caused by: ? Diabetes. This is the most common disease that damages the nerves. ? Lack of vitamin B from long-term alcohol abuse.  Traumatic. Any injury that cuts, crushes, or stretches a nerve can cause damage and pain. A common example is feeling pain after losing an arm or leg (phantom limb pain).  Compression-related. If a sensory nerve gets trapped or compressed for a long period of time, the blood supply to the nerve can be cut off.  Vascular. Many blood vessel diseases can cause neuropathic pain by decreasing blood  supply and oxygen to nerves.  Autoimmune. This type of pain results from diseases in which the body's defense system (immune system) mistakenly attacks sensory nerves. Examples of autoimmune diseases that can cause neuropathic pain include lupus and multiple sclerosis.  Infectious. Many types of viral infections can damage sensory nerves and cause pain. Shingles infection is a common cause of this type of pain.  Inherited. Neuropathic pain can be a symptom of many diseases that are passed down through families (genetic). What increases the risk? You are more likely to develop this condition if:  You have diabetes.  You smoke.  You drink too much alcohol.  You are taking certain medicines, including medicines that kill cancer cells (chemotherapy) or that treat immune system disorders. What are the signs or symptoms? The main symptom is pain. Neuropathic pain is often described as:  Burning.  Shock-like.  Stinging.  Hot or cold.  Itching. How is this diagnosed? No single test can diagnose neuropathic pain. It is diagnosed based on:  Physical exam and your symptoms. Your health care provider will ask you about your pain. You may be asked to use a pain scale to describe how bad your pain is.  Tests. These may be done to see if you have a high sensitivity to pain and to help find the cause and location of any sensory nerve damage. They include: ? Nerve conduction studies to test how well nerve signals travel through your sensory nerves (electrodiagnostic testing). ? Stimulating your sensory nerves through electrodes on your skin and  measuring the response in your spinal cord and brain (somatosensory evoked potential).  Imaging studies, such as: ? X-rays. ? CT scan. ? MRI. How is this treated? Treatment for neuropathic pain may change over time. You may need to try different treatment options or a combination of treatments. Some options include:  Treating the underlying cause  of the neuropathy, such as diabetes, kidney disease, or vitamin deficiencies.  Stopping medicines that can cause neuropathy, such as chemotherapy.  Medicine to relieve pain. Medicines may include: ? Prescription or over-the-counter pain medicine. ? Anti-seizure medicine. ? Antidepressant medicines. ? Pain-relieving patches that are applied to painful areas of skin. ? A medicine to numb the area (local anesthetic), which can be injected as a nerve block.  Transcutaneous nerve stimulation. This uses electrical currents to block painful nerve signals. The treatment is painless.  Alternative treatments, such as: ? Acupuncture. ? Meditation. ? Massage. ? Physical therapy. ? Pain management programs. ? Counseling. Follow these instructions at home: Medicines   Take over-the-counter and prescription medicines only as told by your health care provider.  Do not drive or use heavy machinery while taking prescription pain medicine.  If you are taking prescription pain medicine, take actions to prevent or treat constipation. Your health care provider may recommend that you: ? Drink enough fluid to keep your urine pale yellow. ? Eat foods that are high in fiber, such as fresh fruits and vegetables, whole grains, and beans. ? Limit foods that are high in fat and processed sugars, such as fried or sweet foods. ? Take an over-the-counter or prescription medicine for constipation. Lifestyle   Have a good support system at home.  Consider joining a chronic pain support group.  Do not use any products that contain nicotine or tobacco, such as cigarettes and e-cigarettes. If you need help quitting, ask your health care provider.  Do not drink alcohol. General instructions  Learn as much as you can about your condition.  Work closely with all your health care providers to find the treatment plan that works best for you.  Ask your health care provider what activities are safe for  you.  Keep all follow-up visits as told by your health care provider. This is important. Contact a health care provider if:  Your pain treatments are not working.  You are having side effects from your medicines.  You are struggling with tiredness (fatigue), mood changes, depression, or anxiety. Summary  Neuropathic pain is pain caused by damage to the nerves that are responsible for certain sensations in your body (sensory nerves).  Neuropathic pain may come and go as damaged nerves heal, or it may stay at the same level for years.  Neuropathic pain is usually a long-term condition that can be difficult to treat. Consider joining a chronic pain support group. This information is not intended to replace advice given to you by your health care provider. Make sure you discuss any questions you have with your health care provider. Document Revised: 02/13/2019 Document Reviewed: 11/09/2017 Elsevier Patient Education  [place].

## 2020-07-05 NOTE — Assessment & Plan Note (Addendum)
Diagnosed with a1c of 6.7 in February. TAKING METFORMIN XR 1000 MG DAILY Diet reviewed; FULL OF STARCHES  . CONTINUE METFORMIN DOSE pending repeat a1c today.  Continue lipitor and checking urine for protein.  Trial of amitrtipytline

## 2020-07-07 ENCOUNTER — Other Ambulatory Visit: Payer: Self-pay | Admitting: Internal Medicine

## 2020-07-07 DIAGNOSIS — I970 Postcardiotomy syndrome: Secondary | ICD-10-CM | POA: Diagnosis not present

## 2020-07-07 DIAGNOSIS — J984 Other disorders of lung: Secondary | ICD-10-CM | POA: Diagnosis not present

## 2020-07-07 DIAGNOSIS — J9811 Atelectasis: Secondary | ICD-10-CM | POA: Diagnosis not present

## 2020-07-07 DIAGNOSIS — I959 Hypotension, unspecified: Secondary | ICD-10-CM | POA: Diagnosis not present

## 2020-07-07 DIAGNOSIS — T82198A Other mechanical complication of other cardiac electronic device, initial encounter: Secondary | ICD-10-CM | POA: Diagnosis not present

## 2020-07-07 DIAGNOSIS — I371 Nonrheumatic pulmonary valve insufficiency: Secondary | ICD-10-CM | POA: Diagnosis not present

## 2020-07-07 DIAGNOSIS — I255 Ischemic cardiomyopathy: Secondary | ICD-10-CM | POA: Diagnosis not present

## 2020-07-07 DIAGNOSIS — I272 Pulmonary hypertension, unspecified: Secondary | ICD-10-CM | POA: Diagnosis not present

## 2020-07-07 DIAGNOSIS — T82857A Stenosis of cardiac prosthetic devices, implants and grafts, initial encounter: Secondary | ICD-10-CM | POA: Diagnosis not present

## 2020-07-07 DIAGNOSIS — D62 Acute posthemorrhagic anemia: Secondary | ICD-10-CM | POA: Diagnosis not present

## 2020-07-07 DIAGNOSIS — J449 Chronic obstructive pulmonary disease, unspecified: Secondary | ICD-10-CM | POA: Diagnosis not present

## 2020-07-07 DIAGNOSIS — Z9889 Other specified postprocedural states: Secondary | ICD-10-CM | POA: Diagnosis not present

## 2020-07-07 DIAGNOSIS — E871 Hypo-osmolality and hyponatremia: Secondary | ICD-10-CM | POA: Diagnosis not present

## 2020-07-07 DIAGNOSIS — T82120A Displacement of cardiac electrode, initial encounter: Secondary | ICD-10-CM | POA: Diagnosis not present

## 2020-07-07 DIAGNOSIS — Z87891 Personal history of nicotine dependence: Secondary | ICD-10-CM | POA: Diagnosis not present

## 2020-07-07 DIAGNOSIS — I5020 Unspecified systolic (congestive) heart failure: Secondary | ICD-10-CM | POA: Diagnosis not present

## 2020-07-07 DIAGNOSIS — T8111XA Postprocedural  cardiogenic shock, initial encounter: Secondary | ICD-10-CM | POA: Diagnosis not present

## 2020-07-07 DIAGNOSIS — I4891 Unspecified atrial fibrillation: Secondary | ICD-10-CM | POA: Diagnosis not present

## 2020-07-07 DIAGNOSIS — E785 Hyperlipidemia, unspecified: Secondary | ICD-10-CM | POA: Diagnosis not present

## 2020-07-07 DIAGNOSIS — Z95810 Presence of automatic (implantable) cardiac defibrillator: Secondary | ICD-10-CM | POA: Diagnosis not present

## 2020-07-07 DIAGNOSIS — Z01818 Encounter for other preprocedural examination: Secondary | ICD-10-CM | POA: Diagnosis not present

## 2020-07-07 DIAGNOSIS — I5023 Acute on chronic systolic (congestive) heart failure: Secondary | ICD-10-CM | POA: Diagnosis not present

## 2020-07-07 DIAGNOSIS — T8119XA Other postprocedural shock, initial encounter: Secondary | ICD-10-CM | POA: Diagnosis not present

## 2020-07-07 DIAGNOSIS — I2510 Atherosclerotic heart disease of native coronary artery without angina pectoris: Secondary | ICD-10-CM | POA: Diagnosis not present

## 2020-07-07 DIAGNOSIS — I5022 Chronic systolic (congestive) heart failure: Secondary | ICD-10-CM | POA: Diagnosis not present

## 2020-07-07 DIAGNOSIS — Z79899 Other long term (current) drug therapy: Secondary | ICD-10-CM | POA: Diagnosis not present

## 2020-07-07 DIAGNOSIS — E1151 Type 2 diabetes mellitus with diabetic peripheral angiopathy without gangrene: Secondary | ICD-10-CM | POA: Diagnosis not present

## 2020-07-07 DIAGNOSIS — I517 Cardiomegaly: Secondary | ICD-10-CM | POA: Diagnosis not present

## 2020-07-07 DIAGNOSIS — I130 Hypertensive heart and chronic kidney disease with heart failure and stage 1 through stage 4 chronic kidney disease, or unspecified chronic kidney disease: Secondary | ICD-10-CM | POA: Diagnosis not present

## 2020-07-07 DIAGNOSIS — Z951 Presence of aortocoronary bypass graft: Secondary | ICD-10-CM | POA: Diagnosis not present

## 2020-07-07 DIAGNOSIS — I361 Nonrheumatic tricuspid (valve) insufficiency: Secondary | ICD-10-CM | POA: Diagnosis not present

## 2020-07-07 DIAGNOSIS — Z4682 Encounter for fitting and adjustment of non-vascular catheter: Secondary | ICD-10-CM | POA: Diagnosis not present

## 2020-07-07 DIAGNOSIS — J982 Interstitial emphysema: Secondary | ICD-10-CM | POA: Diagnosis not present

## 2020-07-07 DIAGNOSIS — J811 Chronic pulmonary edema: Secondary | ICD-10-CM | POA: Diagnosis not present

## 2020-07-07 DIAGNOSIS — I5084 End stage heart failure: Secondary | ICD-10-CM | POA: Diagnosis not present

## 2020-07-07 DIAGNOSIS — Z452 Encounter for adjustment and management of vascular access device: Secondary | ICD-10-CM | POA: Diagnosis not present

## 2020-07-07 DIAGNOSIS — Z4502 Encounter for adjustment and management of automatic implantable cardiac defibrillator: Secondary | ICD-10-CM | POA: Diagnosis not present

## 2020-07-07 DIAGNOSIS — G8918 Other acute postprocedural pain: Secondary | ICD-10-CM | POA: Diagnosis not present

## 2020-07-07 DIAGNOSIS — N179 Acute kidney failure, unspecified: Secondary | ICD-10-CM | POA: Diagnosis not present

## 2020-07-07 DIAGNOSIS — I5043 Acute on chronic combined systolic (congestive) and diastolic (congestive) heart failure: Secondary | ICD-10-CM | POA: Diagnosis not present

## 2020-07-07 DIAGNOSIS — J951 Acute pulmonary insufficiency following thoracic surgery: Secondary | ICD-10-CM | POA: Diagnosis not present

## 2020-07-07 DIAGNOSIS — R918 Other nonspecific abnormal finding of lung field: Secondary | ICD-10-CM | POA: Diagnosis not present

## 2020-07-07 DIAGNOSIS — J948 Other specified pleural conditions: Secondary | ICD-10-CM | POA: Diagnosis not present

## 2020-07-07 DIAGNOSIS — N183 Chronic kidney disease, stage 3 unspecified: Secondary | ICD-10-CM | POA: Diagnosis not present

## 2020-07-07 DIAGNOSIS — T829XXA Unspecified complication of cardiac and vascular prosthetic device, implant and graft, initial encounter: Secondary | ICD-10-CM | POA: Diagnosis not present

## 2020-07-07 DIAGNOSIS — I6522 Occlusion and stenosis of left carotid artery: Secondary | ICD-10-CM | POA: Diagnosis not present

## 2020-07-07 DIAGNOSIS — Z95811 Presence of heart assist device: Secondary | ICD-10-CM | POA: Diagnosis not present

## 2020-07-07 DIAGNOSIS — Z952 Presence of prosthetic heart valve: Secondary | ICD-10-CM | POA: Diagnosis not present

## 2020-07-07 DIAGNOSIS — J90 Pleural effusion, not elsewhere classified: Secondary | ICD-10-CM | POA: Diagnosis not present

## 2020-07-07 DIAGNOSIS — I5181 Takotsubo syndrome: Secondary | ICD-10-CM | POA: Diagnosis not present

## 2020-07-07 DIAGNOSIS — I252 Old myocardial infarction: Secondary | ICD-10-CM | POA: Diagnosis not present

## 2020-07-07 DIAGNOSIS — J942 Hemothorax: Secondary | ICD-10-CM | POA: Diagnosis not present

## 2020-07-07 DIAGNOSIS — I420 Dilated cardiomyopathy: Secondary | ICD-10-CM | POA: Diagnosis not present

## 2020-07-07 DIAGNOSIS — R9430 Abnormal result of cardiovascular function study, unspecified: Secondary | ICD-10-CM | POA: Diagnosis not present

## 2020-07-07 DIAGNOSIS — Z953 Presence of xenogenic heart valve: Secondary | ICD-10-CM | POA: Diagnosis not present

## 2020-07-08 ENCOUNTER — Encounter: Payer: Self-pay | Admitting: *Deleted

## 2020-07-08 ENCOUNTER — Other Ambulatory Visit: Payer: Self-pay | Admitting: *Deleted

## 2020-07-08 NOTE — Patient Outreach (Signed)
[facility]) Care Management [place] Telephone Outreach Care Coordination  07/08/2020  [name] 1951-01-13 [id number]  [facility] CM Telephone Outreach Care Coordination re:  Ashley [name], 69 y/o femaleor[place]ref[place] by [facility] RN CM afterhospitalization July 21-25, 2021for acute on chronic CHF exacerbation.Patient has history including, but not limited to, combined CHF; CAD with previous MI/ CABG, and ICD; HTN/ HLD; GERD; COPD;history oftobacco use-patient has quit smoking since June 19, 2019.  Patient hashadmultiple hospital re-admissions over the last year, most within 30 days of one another; all related to acute respiratory failure due to CHF exacerbation.  Most recent hospitalization January 02, 2020- January 08, 2020 at [facility]: patient was admitted to [facility] heart failure unit/ ICU after attending scheduled cardiothoracic surgeon provider office visit; patient was evaluated for LVAD vs. Heart Transplant vs. MVR revision; patient was discharged home to self-care on continuous milrinone gtt infusionvia PICC [place] [facility] services for infusion RN.  With review of EHR prior to placing scheduled outreach call noted that patient is currently admitted at [facility] for elective cardiac catherization, post- recent ECHOcardiogram 06/16/20 indicating significantly increased MR.  Plan:  Will re-schedule today's [facility] CM outreach to next week  [name], RN, BSN, [place] Care Management  [phone]

## 2020-07-10 LAB — METHYLMALONIC ACID(MMA), RND URINE
Creatinine: 1.88 mmol/L (ref 1.77–23.31)
Methylmalonic acid: 0.9 mmol/mol{creat} (ref 0.3–2.2)

## 2020-07-14 ENCOUNTER — Other Ambulatory Visit: Payer: Self-pay | Admitting: *Deleted

## 2020-07-14 ENCOUNTER — Ambulatory Visit: Payer: Self-pay | Admitting: *Deleted

## 2020-07-14 NOTE — Patient Outreach (Signed)
[facility]) Care Management  07/14/2020  KIKUE GERHART 04-13-1951 [id number]   [facility] CM Telephone Outreach Care Coordination re:  Vana Arif, 69 y/o femaleoriginallyreferred to [place] by [facility] Liaison RN CM afterhospitalization July 21-25, 207for acute on chronic CHF exacerbation.Patient has history including, but not limited to, combined CHF; CAD with previous MI/ CABG, and ICD; HTN/ HLD; GERD; COPD;history oftobacco use-patient has quit smoking since June 19, 2019.  Patient hashadmultiple hospital re-admissions over the last year, most within 30 days of one another; all related to acute respiratory failure due to CHF exacerbation.  Most recent hospitalization January 02, 2020- January 08, 2020 at [facility]: patient was admitted to [facility] heart failure unit/ ICU after attending scheduled cardiothoracic surgeon provider office visit; patient was evaluated for LVAD vs. Heart Transplant vs. MVR revision; patient was discharged home to self-care on continuous milrinone gtt infusionvia [facility] [place] [facility] health services for infusion RN.  With review of EHR prior to placing scheduled outreach calls this month, noted that patient remains currently admitted at [facility]; she presented 07/07/20 for elective cardiac catherization, post- recent ECHOcardiogram 06/16/20 indicating significantly increased MR, with progressive shortness of breath.    Review of EHR this morning indicates that patient is currently admitted at [facility] pending possible MVR/ LVAD/ heart transplant  Secure communication via EMR sent to [place] and [place], and [facility] CM Pharmacist [name] Darnelle Maffucci, notifying of patient's current hospital admission  Plan:  [place] RN CM will follow patient's progress while hospitalized and collaborate with [facility] liaison's for discharge disposition.  [name], RN, BSN, [facility] [employer] [facility] Care Management  [phone]

## 2020-07-16 DIAGNOSIS — Z952 Presence of prosthetic heart valve: Secondary | ICD-10-CM | POA: Insufficient documentation

## 2020-07-25 ENCOUNTER — Other Ambulatory Visit: Payer: Self-pay | Admitting: Internal Medicine

## 2020-07-26 ENCOUNTER — Other Ambulatory Visit: Payer: Self-pay | Admitting: *Deleted

## 2020-07-26 NOTE — Patient Outreach (Signed)
[facility]) Care Management  07/26/2020  Ashley [name] [birth year] [id number]  [facility] CM Telephone Outreach Care Coordination/ Case Closurere:  Ashley [name], 69 y/o femaleoriginallyreferred to [place] by [facility] Liaison RN CM afterhospitalization July 21-25, 2054for acute on chronic CHF exacerbation.Patient has history including, but not limited to, combined CHF; CAD with previous MI/ CABG, and ICD; HTN/ HLD; GERD; COPD;history oftobacco use-patient has quit smoking since June 19, 2019.  Patient hashadmultiple hospital re-admissions over the last year, most within 30 days of one another; all related to acute respiratory failure due to CHF exacerbation.  Most recent hospitalization January 02, 2020- January 08, 2020 at [facility]: patient was admitted to [facility] heart failure unit/ ICU after attending scheduled cardiothoracic surgeon provider office visit; patient was evaluated for LVAD vs. Heart Transplant vs. MVR revision; patient was discharged home to self-care on continuous milrinone gtt infusionvia [facility] [place] home health services for infusion RN.  With recent review of EHR noted that patient remains currently admitted at [facility]; she presented 07/07/20 for elective cardiac catherization, post- recent ECHOcardiogram 06/16/20 indicating significantly increased MR, with progressive shortness of breath.    Review of EHR this morning indicates that patient remains currently admitted at [facility] post MVR/ LVAD  Secure communication via EMR sent to [place] and [place], and [place], notifying of patient's current and ongoing prolonged hospital admission; notified of [facility] CM case closure due to being hospitalized > 10 days.    Plan:  [facility] Community RN CM will close patient's case and resume follow up as indicated with collaboration from [facility] liaison's/ [facility] Pharmacist embedded in  PCP office once patient discharged from current hospitalization.  [name], RN, BSN, [employer] [facility] Care Management  [phone]

## 2020-07-27 ENCOUNTER — Encounter: Payer: Self-pay | Admitting: Internal Medicine

## 2020-07-31 DIAGNOSIS — J449 Chronic obstructive pulmonary disease, unspecified: Secondary | ICD-10-CM | POA: Diagnosis not present

## 2020-07-31 DIAGNOSIS — I5020 Unspecified systolic (congestive) heart failure: Secondary | ICD-10-CM | POA: Diagnosis not present

## 2020-08-02 ENCOUNTER — Telehealth: Payer: Self-pay

## 2020-08-02 DIAGNOSIS — Z7689 Persons encountering health services in other specified circumstances: Secondary | ICD-10-CM | POA: Diagnosis not present

## 2020-08-02 NOTE — Telephone Encounter (Signed)
Patient declines hospital follow up at this time.Currently followed by Cardiology for recent surgery. Encouraged patient to contact pcp as needed for follow up.

## 2020-08-03 DIAGNOSIS — Z95811 Presence of heart assist device: Secondary | ICD-10-CM | POA: Diagnosis not present

## 2020-08-03 DIAGNOSIS — Z4801 Encounter for change or removal of surgical wound dressing: Secondary | ICD-10-CM | POA: Diagnosis not present

## 2020-08-03 DIAGNOSIS — Z7901 Long term (current) use of anticoagulants: Secondary | ICD-10-CM | POA: Insufficient documentation

## 2020-08-03 DIAGNOSIS — I428 Other cardiomyopathies: Secondary | ICD-10-CM | POA: Diagnosis not present

## 2020-08-03 DIAGNOSIS — Z48812 Encounter for surgical aftercare following surgery on the circulatory system: Secondary | ICD-10-CM | POA: Diagnosis not present

## 2020-08-04 DIAGNOSIS — I119 Hypertensive heart disease without heart failure: Secondary | ICD-10-CM | POA: Diagnosis not present

## 2020-08-04 DIAGNOSIS — Z23 Encounter for immunization: Secondary | ICD-10-CM | POA: Diagnosis not present

## 2020-08-04 DIAGNOSIS — I4891 Unspecified atrial fibrillation: Secondary | ICD-10-CM | POA: Diagnosis not present

## 2020-08-04 DIAGNOSIS — Z4509 Encounter for adjustment and management of other cardiac device: Secondary | ICD-10-CM | POA: Diagnosis not present

## 2020-08-04 DIAGNOSIS — R9431 Abnormal electrocardiogram [ECG] [EKG]: Secondary | ICD-10-CM | POA: Diagnosis not present

## 2020-08-04 DIAGNOSIS — Z7901 Long term (current) use of anticoagulants: Secondary | ICD-10-CM | POA: Diagnosis not present

## 2020-08-04 DIAGNOSIS — I5022 Chronic systolic (congestive) heart failure: Secondary | ICD-10-CM | POA: Diagnosis not present

## 2020-08-04 DIAGNOSIS — Z79899 Other long term (current) drug therapy: Secondary | ICD-10-CM | POA: Diagnosis not present

## 2020-08-12 ENCOUNTER — Ambulatory Visit (INDEPENDENT_AMBULATORY_CARE_PROVIDER_SITE_OTHER): Payer: Medicare Other | Admitting: Pharmacist

## 2020-08-12 DIAGNOSIS — E1140 Type 2 diabetes mellitus with diabetic neuropathy, unspecified: Secondary | ICD-10-CM | POA: Diagnosis not present

## 2020-08-12 DIAGNOSIS — I5032 Chronic diastolic (congestive) heart failure: Secondary | ICD-10-CM | POA: Diagnosis not present

## 2020-08-12 DIAGNOSIS — I25700 Atherosclerosis of coronary artery bypass graft(s), unspecified, with unstable angina pectoris: Secondary | ICD-10-CM | POA: Diagnosis not present

## 2020-08-12 NOTE — Progress Notes (Signed)
I have collaborated with the care management provider regarding care management and care coordination activities outlined in this encounter and have reviewed this encounter including documentation in the note and care plan. I am certifying that I agree with the content of this note and encounter as supervising physician.  Regards,   Whitaker Holderman, MD    

## 2020-08-12 NOTE — Chronic Care Management (AMB) (Signed)
Chronic Care Management   Follow Up Note   08/12/2020 Name: Ashley [name] MRN: [id number] DOB: 25-May-1951  Referred by: [name], MD Reason for referral : Chronic Care Management (Medication Management)   Ashley [name] is a 69 y.o. year old female who is a primary care patient of [name], [name], MD. The CCM team was consulted for assistance with chronic disease management and care coordination needs.    Contacted patient for medication management review.   Review of patient status, including review of consultants reports, relevant laboratory and other test results, and collaboration with appropriate care team members and the patient's provider was performed as part of comprehensive patient evaluation and provision of chronic care management services.    SDOH (Social Determinants of Health) assessments performed: Yes See Care Plan activities for detailed interventions related to SDOH)  SDOH Interventions     Most Recent Value  SDOH Interventions  Financial Strain Interventions Other (Comment)  [medicare extra help recipient]       Outpatient Encounter Medications as of 08/12/2020  Medication Sig Note   albuterol (VENTOLIN HFA) 108 (90 Base) MCG/ACT inhaler INHALE 2 PUFFS BY MOUTH INTO THE LUNGS EVERY 3 HOURS AS NEEDED FOR WHEEZING 06/24/2020: Using ~ daily lately   allopurinol (ZYLOPRIM) 100 MG tablet Take 1 tablet (100 mg total) by mouth daily.    ALPRAZolam (XANAX) 0.5 MG tablet TAKE 1/2 TO 1 TABLET BY MOUTH AT BEDTIME AS NEEDED FOR ANXIETY    amiodarone (PACERONE) 200 MG tablet Take 200 mg by mouth daily.    amitriptyline (ELAVIL) 25 MG tablet Take 1 tablet (25 mg total) by mouth at bedtime.    atorvastatin (LIPITOR) 40 MG tablet TAKE ONE TABLET BY MOUTH ONCE DAILY    diphenhydrAMINE (DIPHENHIST) 25 mg capsule Take 25 mg by mouth at bedtime as needed for allergies or sleep.     escitalopram (LEXAPRO) 10 MG tablet TAKE ONE TABLET BY MOUTH ONCE DAILY     ferrous sulfate 325 (65 FE) MG EC tablet Take 325 mg by mouth daily with breakfast.    Fluticasone-Umeclidin-Vilant (TRELEGY ELLIPTA) 100-62.5-25 MCG/INH AEPB Inhale 1 puff into the lungs daily.    GNP MUCUS ER 600 MG 12 hr tablet Take 600 mg by mouth 2 (two) times daily.    ipratropium-albuterol (DUONEB) 0.5-2.5 (3) MG/3ML SOLN USE 1 VIAL VIA NEBULIZER EVERY 6 HOURS AS NEEDED    lisinopril (ZESTRIL) 10 MG tablet Take 10 mg by mouth daily.    Magnesium Oxide 400 (240 Mg) MG TABS Take 1 tablet by mouth daily.    metFORMIN (GLUCOPHAGE-XR) 500 MG 24 hr tablet TAKE TWO TABLETS BY MOUTH EVERY MORNING    NON FORMULARY Take 1 capsule by mouth daily. [place] Study Acetylsalicylic acid, ASA, 299 mg or Placebo (IDS 2365) -eIRB # [id number] capsule; For Investigational Use Only. DO NOT PRESCRIBE OR ADMINISTER ANY ASPIRIN CONTAINING PRODUCTS.    omeprazole (PRILOSEC) 40 MG capsule TAKE ONE CAPSULE BY MOUTH ONCE DAILY    oxyCODONE (OXY IR/ROXICODONE) 5 MG immediate release tablet Take 5 mg by mouth every 6 (six) hours as needed.    potassium chloride (KLOR-CON) 10 MEQ tablet Take 10 mEq by mouth 2 (two) times daily.    spironolactone (ALDACTONE) 25 MG tablet Take 25 mg by mouth daily.    torsemide (DEMADEX) 10 MG tablet Take 10 mg by mouth daily.     traZODone (DESYREL) 50 MG tablet Take 0.5-1 tablets (25-50 mg total) by mouth daily. One  hour before bedtime (Patient taking differently: Take 50 mg by mouth daily. One hour before bedtime)    warfarin (COUMADIN) 2 MG tablet Take 4 mg by mouth at bedtime.    acetaminophen (TYLENOL) 325 MG tablet Take 650 mg by mouth every 6 (six) hours as needed.    colchicine 0.6 MG tablet TAKE TWO TABLETS BY MOUTH AT FIRST SIGN OF GOUT FLARE FOLLOW BY 1 TABLET DAILY UNTIL GOUT FLARE RESOLVES (Patient not taking: Reported on 08/12/2020)    cyanocobalamin (,VITAMIN B-12,) 1000 MCG/ML injection Inject 1 mL into the muscle monthly (Patient taking differently: Inject 1,000 mcg  into the muscle every 30 (thirty) days. )    [DISCONTINUED] aspirin 81 MG tablet Take 81 mg by mouth daily.    [DISCONTINUED] clopidogrel (PLAVIX) 75 MG tablet Take 1 tablet (75 mg total) by mouth daily.    [DISCONTINUED] ferrous gluconate (FERGON) 324 MG tablet Take 324 mg by mouth 2 (two) times daily.    [DISCONTINUED] magnesium oxide (MAG-OX) 400 (241.3 Mg) MG tablet Take 1 tablet by mouth daily.    [DISCONTINUED] sacubitril-valsartan (ENTRESTO) 24-26 MG Take 1 tablet by mouth in the morning and at bedtime.    No facility-administered encounter medications on file as of 08/12/2020.     Objective:   Goals Addressed              This Visit's Progress     Patient Stated     PharmD "I want to stay out of the hospital" (pt-stated)        CARE PLAN ENTRY (see longtitudinal plan of care for additional care plan information)  Current Barriers:   Social, financial, and community barriers:  o S/p hospitalization at [facility] 9/1-9/24/21 - LVAD placement and bioprosthetic MVR o Reports home health was ordered at discharge, but patient notes that she was told they couldn't find anyone w/ LVAD experience, so she has not had any home health. Notes her fiance has been helping w/ LVAD dressing changes.  o Patient has Full Medicare Extra Help: Generic prescriptions are $3.70 for 30 or 90 day supply; brand prescriptions are $9.20 for 30 or 90 day supply  Polypharmacy; complex patient with multiple comorbidities including HFrEF (LVAD), MVR w/ replacement 2021, COPD, CAD (hx NSTEMI, s/p CABG x3 and mitral valve repair in 2007), continued severe mitral valve regurgitation; barrett's esophagus, depression  Self-manages medications by filling a BID pill box. Fiance has been helping her w/ LVAD management and medication management since discharge.  o CHF: HFrEF, LVAD: follows w/ Dr. Saralyn Pilar as primary and Dr. Doyne Keel as CHF specialist; lisinopril 10 mg daily, spironolactone 25 mg BID, torsemide 10 mg  daily, magnesium 400 mg daily, potassium 10 mEq, ferrous sulfate 325 mg BID o Arrhythmia: amiodarone 200 mg daily, per last cardiology office note, this may be weaned at next visit o LVAD anticoagulation: wafarin 4 mg daily, INR goal 2-3. Managed by [facility] cardiology.  o ASCVD risk reduction (hx NSTEMI, CABG): in investigational study, patient CANNOT have any additional antiplatelet medications o COPD: Tx Trelegy daily + albuterol HFA PRN: denies any albuterol use since hospital discharge o Insomnia/neuropathy, depression: escitalopram 10 mg daily, amitriptyline 25 mg QPM, trazodone 50 mg QPM, PRN alprazolam 0.25 mg QPM o Hx tobacco abuse: patient has been TOBACCO FREE since 06/19/2019. o Gout prevention: allopurinol 100 mg daily o Pain management: oxycodone 5 mg Q6H PRN, senna-docusate PRN, patient reports taking daily   Pharmacist Clinical Goal(s):   Over the next 90 days, patient  will work with PharmD and provider towards optimized medication management  Interventions:  Comprehensive medication review performed, medication list updated in electronic medical record  Inter-disciplinary care team collaboration (see longitudinal plan of care)  Extensive medication review. Reviewed impact of addition of warfarin on drug and food interactions.   Will communicate w/ [facility] RN CM concerning patient's recent discharge.   Patient Self Care Activities:   Patient will take medications as prescribed  Patient will attend follow up appointments as scheduled.   Please see past updates related to this goal by clicking on the "Past Updates" button in the selected goal          Plan:  - Scheduled f/u call in ~ 8 weeks  [name] Darnelle Maffucci, PharmD, [place], [place] Pharmacist [place] [phone]

## 2020-08-12 NOTE — Patient Instructions (Signed)
Visit Information  Goals Addressed              This Visit's Progress     Patient Stated   .  PharmD "I want to stay out of the hospital" (pt-stated)        [place] (see longtitudinal plan of care for additional care plan information)  Current Barriers:  . Social, financial, and community barriers:  o S/p hospitalization at [facility] 9/1-9/24/21 - LVAD placement and bioprosthetic MVR o Reports home health was ordered at discharge, but patient notes that she was told they couldn't find anyone w/ LVAD experience, so she has not had any home health. Notes her fiance has been helping w/ LVAD dressing changes.  o Patient has Full Medicare Extra Help: Generic prescriptions are $3.70 for 30 or 90 day supply; brand prescriptions are $9.20 for 30 or 90 day supply . Polypharmacy; complex patient with multiple comorbidities including HFrEF (LVAD), MVR w/ replacement 2021, COPD, CAD (hx NSTEMI, s/p CABG x3 and mitral valve repair in 2007), continued severe mitral valve regurgitation; barrett's esophagus, depression . Self-manages medications by filling a BID pill box. Fiance has been helping her w/ LVAD management and medication management since discharge.  o CHF: HFrEF, LVAD: follows w/ Dr. Saralyn Pilar as primary and Dr. Doyne Keel as CHF specialist; lisinopril 10 mg daily, spironolactone 25 mg BID, torsemide 10 mg daily, magnesium 400 mg daily, potassium 10 mEq, ferrous sulfate 325 mg BID o Arrhythmia: amiodarone 200 mg daily, per last cardiology office note, this may be weaned at next visit o LVAD anticoagulation: wafarin 4 mg daily, INR goal 2-3. Managed by [facility] cardiology.  o ASCVD risk reduction (hx NSTEMI, CABG): in investigational study, patient CANNOT have any additional antiplatelet medications o COPD: Tx Trelegy daily + albuterol HFA PRN: denies any albuterol use since hospital discharge o Insomnia/neuropathy, depression: escitalopram 10 mg daily, amitriptyline 25 mg QPM, trazodone 50 mg QPM,  PRN alprazolam 0.25 mg QPM o Hx tobacco abuse: patient has been TOBACCO FREE since 06/19/2019. o Gout prevention: allopurinol 100 mg daily o Pain management: oxycodone 5 mg Q6H PRN, senna-docusate PRN, patient reports taking daily   Pharmacist Clinical Goal(s):  [name] Over the next 90 days, patient will work with PharmD and provider towards optimized medication management  Interventions: . Comprehensive medication review performed, medication list updated in electronic medical record . Inter-disciplinary care team collaboration (see longitudinal plan of care) . Extensive medication review. Reviewed impact of addition of warfarin on drug and food interactions.  . Will communicate w/ [facility] RN CM concerning patient's recent discharge.   Patient Self Care Activities:  . Patient will take medications as prescribed . Patient will attend follow up appointments as scheduled.   Please see past updates related to this goal by clicking on the "Past Updates" button in the selected goal         The patient verbalized understanding of instructions provided today and declined a print copy of patient instruction materials.   Plan:  - Scheduled f/u call in ~ 8 weeks  [name] Darnelle Maffucci, PharmD, [place], [place] Pharmacist [place] [phone]

## 2020-08-16 ENCOUNTER — Other Ambulatory Visit: Payer: Self-pay | Admitting: *Deleted

## 2020-08-16 ENCOUNTER — Encounter: Payer: Self-pay | Admitting: *Deleted

## 2020-08-16 NOTE — Patient Outreach (Signed)
[facility]) Care Management [facility] CM Telephone Outreach,  PCP office completes Transition of Care follow up post-hospital discharge Post-hospital discharge day # 17  08/16/2020  EDAN SERRATORE 1951/09/24 [id number]  Successful initial telephone outreach to [name], 69 y/o femalepreviously active with [facility] RN CM and [facility] Pharmacist embedded at patient's PCP office; was notified by [facility] Pharmacist that patient had discharged home to self- care without home health services in place from recent hospitalization at [facility] September 1- 24, 2021 for LVAD insertion.  Patient has history including, but not limited to, combined CHF; CAD with previous MI/ CABG, and ICD; HTN/ HLD; GERD; COPD;history oftobacco use-patient quit smoking June 19, 2019.  HIPAA/ identity verified and patient provides ongoing verbal consent for [facility] CM involvement in her care.  Patientreports "doing very good" post- recent hospitalization and denies clinical concerns, issues, problems.  Reports significant other [name] assisting in care needs at home as indicated.  Continues monitoring and recording daily weights; reports weight ranges post-hospital discharge between 155-160, with weight today of 159 lbs.  Denies issues around breathing.  Using walker for ongoing fatigue as she recuperates.  Staying in close contact with LVAD team at [facility].  Continues working with [facility] Pharmacist; now on Coumadin- understands importance/ need for ongoing routine INR monitoring.  Has scheduled follow appointment with LVAD team on Friday August 20, 2020- significant other continues to provide transportation. She sounds to be in no distress throughout call today.  Goals Addressed              This Visit's Progress   .  COMPLETED: "I want to continue staying healthy and at home and out of the hospital" (pt-stated)        [place] (see longtitudinal plan of care for additional care plan information)   Current  Barriers:  [name] Multiple past hospitalizations related to HF  Case Manager Clinical Goal(s):  [name] Over the next 120 days, patient will continue to accurately verbalize understanding of Heart Failure Action Plan, including when to call doctor and when to seek urgent/ emergent care for development of signs/ symptoms yellow CHF zone  Interventions:  . Reviewed specific Heart Failure Action Plan in depth . Reviewed all current medications and confirmed that patient denies medication concerns; no discrepancies or concerns identified as a result of medication review . Reviewed with patient current clinical status and recent daily weights at home . Reviewed recent use of rescue inhaler/ nebulizer  . Updated SDOH . Sent patient's PCP quarterly summary  Patient Self Care Activities:  . Takes Heart Failure Medications as prescribed . Weighs daily and record (notifying MD of 3 lb weight gain over night or 5 lb in a week) . Verbalizes understanding of and follows CHF Action Plan- confirms that she has contact information for all care providers . Adheres to low sodium diet . Attends all provider appointments  Initial goal documentation-- continued from previous care plan with established long and short term goals successfully met; discussed with patient that preference for ongoing outreach- will contact for next scheduled outreach in 6 weeks, with stated long-term goal as above  08/16/20: Resolving due to duplication of goals  [name], RN, BSN, [place] Care Management  [phone]      .  [facility] CM: Learn More About My Health        Follow Up Date 09/16/2020    - tell my story and reason for my visit -  make a list of questions - ask questions - repeat what I heard to make sure I understand - bring a list of my medicines to the visit - speak up when I don't understand    Why is this important?   The best way to learn about your health and care  is by talking to the doctor and nurse.  They will answer your questions and give you information in the way that you like best.    Notes:   08/16/20:  -- Encouraged patient to clarify specific provider dietary recommendations post- LVAD insertion -- Encouraged patient to obtain guidance from care providers around timing of receiving Corona virus vaccine post- LVAD insertion -- Encouraged patient to ask provider about INR monitoring close to her home    .  [facility] CM: Track and Manage Fluids and Swelling        Follow Up Date 09/16/2020    - call office if I gain more than 2 pounds in one day or 5 pounds in one week - track weight in diary - use salt in moderation - watch for swelling in feet, ankles and legs every day - weigh myself daily    Why is this important?   It is important to check your weight daily and watch how much salt and liquids you have.  It will help you to manage your heart failure.    Notes:     .  [facility] CM: Track and Manage Symptoms        Follow Up Date 09/16/2020    - develop a rescue plan - eat more whole grains, fruits and vegetables, lean meats and healthy fats - follow rescue plan if symptoms flare-up - know when to call the doctor - track symptoms and what helps feel better or worse - dress right for the weather, hot or cold    Why is this important?   You will be able to handle your symptoms better if you keep track of them.  Making some simple changes to your lifestyle will help.  Eating healthy is one thing you can do to take good care of yourself.    Notes:       Patient denies further issues, concerns, or problems today.  I confirmed that patient has my direct phone number, the main [facility] CM office phone number, and the [facility] CM 24-hour nurse advice phone number should issues arise prior to next scheduled [facility] CM outreach by phone next week.  Encouraged patient to contact me directly if needs, questions, issues, or concerns arise prior to next  scheduled outreach; patient agreed to do so.  [name], RN, BSN, [employer] [facility] Care Management  [phone]

## 2020-08-20 DIAGNOSIS — Z95811 Presence of heart assist device: Secondary | ICD-10-CM | POA: Diagnosis not present

## 2020-08-20 DIAGNOSIS — I4589 Other specified conduction disorders: Secondary | ICD-10-CM | POA: Diagnosis not present

## 2020-08-20 DIAGNOSIS — Z9889 Other specified postprocedural states: Secondary | ICD-10-CM | POA: Diagnosis not present

## 2020-08-20 DIAGNOSIS — Z7982 Long term (current) use of aspirin: Secondary | ICD-10-CM | POA: Diagnosis not present

## 2020-08-20 DIAGNOSIS — R9431 Abnormal electrocardiogram [ECG] [EKG]: Secondary | ICD-10-CM | POA: Diagnosis not present

## 2020-08-20 DIAGNOSIS — Z952 Presence of prosthetic heart valve: Secondary | ICD-10-CM | POA: Diagnosis not present

## 2020-08-20 DIAGNOSIS — Z7901 Long term (current) use of anticoagulants: Secondary | ICD-10-CM | POA: Diagnosis not present

## 2020-08-20 DIAGNOSIS — J9811 Atelectasis: Secondary | ICD-10-CM | POA: Diagnosis not present

## 2020-08-20 DIAGNOSIS — Z48812 Encounter for surgical aftercare following surgery on the circulatory system: Secondary | ICD-10-CM | POA: Diagnosis not present

## 2020-08-20 DIAGNOSIS — Z95810 Presence of automatic (implantable) cardiac defibrillator: Secondary | ICD-10-CM | POA: Diagnosis not present

## 2020-08-20 DIAGNOSIS — I5022 Chronic systolic (congestive) heart failure: Secondary | ICD-10-CM | POA: Diagnosis not present

## 2020-08-20 DIAGNOSIS — Z79899 Other long term (current) drug therapy: Secondary | ICD-10-CM | POA: Diagnosis not present

## 2020-08-25 DIAGNOSIS — Z952 Presence of prosthetic heart valve: Secondary | ICD-10-CM | POA: Diagnosis not present

## 2020-08-25 DIAGNOSIS — Z7901 Long term (current) use of anticoagulants: Secondary | ICD-10-CM | POA: Diagnosis not present

## 2020-08-25 DIAGNOSIS — Z95811 Presence of heart assist device: Secondary | ICD-10-CM | POA: Diagnosis not present

## 2020-08-25 DIAGNOSIS — Z7982 Long term (current) use of aspirin: Secondary | ICD-10-CM | POA: Diagnosis not present

## 2020-08-25 DIAGNOSIS — T8132XA Disruption of internal operation (surgical) wound, not elsewhere classified, initial encounter: Secondary | ICD-10-CM | POA: Diagnosis not present

## 2020-08-26 DIAGNOSIS — Z7901 Long term (current) use of anticoagulants: Secondary | ICD-10-CM | POA: Diagnosis not present

## 2020-08-26 DIAGNOSIS — Z952 Presence of prosthetic heart valve: Secondary | ICD-10-CM | POA: Diagnosis not present

## 2020-09-01 DIAGNOSIS — R339 Retention of urine, unspecified: Secondary | ICD-10-CM | POA: Diagnosis not present

## 2020-09-01 DIAGNOSIS — T8141XA Infection following a procedure, superficial incisional surgical site, initial encounter: Secondary | ICD-10-CM | POA: Diagnosis not present

## 2020-09-01 DIAGNOSIS — T8579XA Infection and inflammatory reaction due to other internal prosthetic devices, implants and grafts, initial encounter: Secondary | ICD-10-CM | POA: Diagnosis not present

## 2020-09-01 DIAGNOSIS — D680 Von Willebrand's disease: Secondary | ICD-10-CM | POA: Diagnosis not present

## 2020-09-01 DIAGNOSIS — N1832 Chronic kidney disease, stage 3b: Secondary | ICD-10-CM | POA: Diagnosis not present

## 2020-09-01 DIAGNOSIS — J9811 Atelectasis: Secondary | ICD-10-CM | POA: Diagnosis not present

## 2020-09-01 DIAGNOSIS — Z9889 Other specified postprocedural states: Secondary | ICD-10-CM | POA: Diagnosis not present

## 2020-09-01 DIAGNOSIS — T8132XA Disruption of internal operation (surgical) wound, not elsewhere classified, initial encounter: Secondary | ICD-10-CM | POA: Diagnosis not present

## 2020-09-01 DIAGNOSIS — N179 Acute kidney failure, unspecified: Secondary | ICD-10-CM | POA: Diagnosis not present

## 2020-09-01 DIAGNOSIS — T8189XA Other complications of procedures, not elsewhere classified, initial encounter: Secondary | ICD-10-CM | POA: Diagnosis not present

## 2020-09-01 DIAGNOSIS — E871 Hypo-osmolality and hyponatremia: Secondary | ICD-10-CM | POA: Diagnosis not present

## 2020-09-01 DIAGNOSIS — D649 Anemia, unspecified: Secondary | ICD-10-CM | POA: Diagnosis not present

## 2020-09-01 DIAGNOSIS — B9689 Other specified bacterial agents as the cause of diseases classified elsewhere: Secondary | ICD-10-CM | POA: Diagnosis not present

## 2020-09-01 DIAGNOSIS — Z452 Encounter for adjustment and management of vascular access device: Secondary | ICD-10-CM | POA: Diagnosis not present

## 2020-09-01 DIAGNOSIS — R112 Nausea with vomiting, unspecified: Secondary | ICD-10-CM | POA: Diagnosis not present

## 2020-09-01 DIAGNOSIS — A419 Sepsis, unspecified organism: Secondary | ICD-10-CM | POA: Diagnosis not present

## 2020-09-01 DIAGNOSIS — T8144XA Sepsis following a procedure, initial encounter: Secondary | ICD-10-CM | POA: Diagnosis not present

## 2020-09-01 DIAGNOSIS — D631 Anemia in chronic kidney disease: Secondary | ICD-10-CM | POA: Diagnosis not present

## 2020-09-01 DIAGNOSIS — K921 Melena: Secondary | ICD-10-CM | POA: Diagnosis not present

## 2020-09-01 DIAGNOSIS — I480 Paroxysmal atrial fibrillation: Secondary | ICD-10-CM | POA: Diagnosis not present

## 2020-09-01 DIAGNOSIS — E861 Hypovolemia: Secondary | ICD-10-CM | POA: Diagnosis not present

## 2020-09-01 DIAGNOSIS — S21101A Unspecified open wound of right front wall of thorax without penetration into thoracic cavity, initial encounter: Secondary | ICD-10-CM | POA: Diagnosis not present

## 2020-09-01 DIAGNOSIS — I5043 Acute on chronic combined systolic (congestive) and diastolic (congestive) heart failure: Secondary | ICD-10-CM | POA: Diagnosis not present

## 2020-09-01 DIAGNOSIS — I9589 Other hypotension: Secondary | ICD-10-CM | POA: Diagnosis not present

## 2020-09-01 DIAGNOSIS — J9851 Mediastinitis: Secondary | ICD-10-CM | POA: Diagnosis not present

## 2020-09-01 DIAGNOSIS — J449 Chronic obstructive pulmonary disease, unspecified: Secondary | ICD-10-CM | POA: Diagnosis not present

## 2020-09-01 DIAGNOSIS — Z85038 Personal history of other malignant neoplasm of large intestine: Secondary | ICD-10-CM | POA: Diagnosis not present

## 2020-09-01 DIAGNOSIS — R6521 Severe sepsis with septic shock: Secondary | ICD-10-CM | POA: Diagnosis not present

## 2020-09-01 DIAGNOSIS — I2510 Atherosclerotic heart disease of native coronary artery without angina pectoris: Secondary | ICD-10-CM | POA: Diagnosis not present

## 2020-09-01 DIAGNOSIS — G4700 Insomnia, unspecified: Secondary | ICD-10-CM | POA: Diagnosis not present

## 2020-09-01 DIAGNOSIS — E1151 Type 2 diabetes mellitus with diabetic peripheral angiopathy without gangrene: Secondary | ICD-10-CM | POA: Diagnosis not present

## 2020-09-01 DIAGNOSIS — E1142 Type 2 diabetes mellitus with diabetic polyneuropathy: Secondary | ICD-10-CM | POA: Diagnosis not present

## 2020-09-01 DIAGNOSIS — Z95811 Presence of heart assist device: Secondary | ICD-10-CM | POA: Diagnosis not present

## 2020-09-01 DIAGNOSIS — I5022 Chronic systolic (congestive) heart failure: Secondary | ICD-10-CM | POA: Diagnosis not present

## 2020-09-01 DIAGNOSIS — I130 Hypertensive heart and chronic kidney disease with heart failure and stage 1 through stage 4 chronic kidney disease, or unspecified chronic kidney disease: Secondary | ICD-10-CM | POA: Diagnosis not present

## 2020-09-01 DIAGNOSIS — T8130XA Disruption of wound, unspecified, initial encounter: Secondary | ICD-10-CM | POA: Diagnosis not present

## 2020-09-01 DIAGNOSIS — K5900 Constipation, unspecified: Secondary | ICD-10-CM | POA: Diagnosis not present

## 2020-09-01 DIAGNOSIS — T8140XA Infection following a procedure, unspecified, initial encounter: Secondary | ICD-10-CM | POA: Diagnosis not present

## 2020-09-01 DIAGNOSIS — E875 Hyperkalemia: Secondary | ICD-10-CM | POA: Diagnosis not present

## 2020-09-02 DIAGNOSIS — R112 Nausea with vomiting, unspecified: Secondary | ICD-10-CM | POA: Diagnosis not present

## 2020-09-02 DIAGNOSIS — Z95811 Presence of heart assist device: Secondary | ICD-10-CM | POA: Diagnosis not present

## 2020-09-02 DIAGNOSIS — N179 Acute kidney failure, unspecified: Secondary | ICD-10-CM | POA: Diagnosis not present

## 2020-09-02 DIAGNOSIS — T8132XA Disruption of internal operation (surgical) wound, not elsewhere classified, initial encounter: Secondary | ICD-10-CM | POA: Diagnosis not present

## 2020-09-02 DIAGNOSIS — E875 Hyperkalemia: Secondary | ICD-10-CM | POA: Diagnosis not present

## 2020-09-02 DIAGNOSIS — A419 Sepsis, unspecified organism: Secondary | ICD-10-CM | POA: Diagnosis not present

## 2020-09-03 DIAGNOSIS — T8141XA Infection following a procedure, superficial incisional surgical site, initial encounter: Secondary | ICD-10-CM | POA: Diagnosis not present

## 2020-09-03 DIAGNOSIS — B9689 Other specified bacterial agents as the cause of diseases classified elsewhere: Secondary | ICD-10-CM | POA: Diagnosis not present

## 2020-09-03 DIAGNOSIS — T8140XA Infection following a procedure, unspecified, initial encounter: Secondary | ICD-10-CM | POA: Insufficient documentation

## 2020-09-04 DIAGNOSIS — Z9889 Other specified postprocedural states: Secondary | ICD-10-CM | POA: Diagnosis not present

## 2020-09-04 DIAGNOSIS — R112 Nausea with vomiting, unspecified: Secondary | ICD-10-CM | POA: Diagnosis not present

## 2020-09-04 DIAGNOSIS — J449 Chronic obstructive pulmonary disease, unspecified: Secondary | ICD-10-CM | POA: Diagnosis not present

## 2020-09-04 DIAGNOSIS — Z95811 Presence of heart assist device: Secondary | ICD-10-CM | POA: Diagnosis not present

## 2020-09-04 DIAGNOSIS — T8579XA Infection and inflammatory reaction due to other internal prosthetic devices, implants and grafts, initial encounter: Secondary | ICD-10-CM | POA: Diagnosis not present

## 2020-09-05 DIAGNOSIS — Z452 Encounter for adjustment and management of vascular access device: Secondary | ICD-10-CM | POA: Diagnosis not present

## 2020-09-05 DIAGNOSIS — R112 Nausea with vomiting, unspecified: Secondary | ICD-10-CM | POA: Diagnosis not present

## 2020-09-05 DIAGNOSIS — J9811 Atelectasis: Secondary | ICD-10-CM | POA: Diagnosis not present

## 2020-09-05 DIAGNOSIS — Z85038 Personal history of other malignant neoplasm of large intestine: Secondary | ICD-10-CM | POA: Diagnosis not present

## 2020-09-05 DIAGNOSIS — D649 Anemia, unspecified: Secondary | ICD-10-CM | POA: Diagnosis not present

## 2020-09-06 DIAGNOSIS — J449 Chronic obstructive pulmonary disease, unspecified: Secondary | ICD-10-CM | POA: Diagnosis not present

## 2020-09-06 DIAGNOSIS — N179 Acute kidney failure, unspecified: Secondary | ICD-10-CM | POA: Diagnosis not present

## 2020-09-06 DIAGNOSIS — I5022 Chronic systolic (congestive) heart failure: Secondary | ICD-10-CM | POA: Diagnosis not present

## 2020-09-06 DIAGNOSIS — K921 Melena: Secondary | ICD-10-CM | POA: Diagnosis not present

## 2020-09-06 DIAGNOSIS — T8140XA Infection following a procedure, unspecified, initial encounter: Secondary | ICD-10-CM | POA: Diagnosis not present

## 2020-09-06 DIAGNOSIS — R112 Nausea with vomiting, unspecified: Secondary | ICD-10-CM | POA: Diagnosis not present

## 2020-09-06 DIAGNOSIS — D649 Anemia, unspecified: Secondary | ICD-10-CM | POA: Diagnosis not present

## 2020-09-06 DIAGNOSIS — T8132XA Disruption of internal operation (surgical) wound, not elsewhere classified, initial encounter: Secondary | ICD-10-CM | POA: Diagnosis not present

## 2020-09-06 DIAGNOSIS — T8579XA Infection and inflammatory reaction due to other internal prosthetic devices, implants and grafts, initial encounter: Secondary | ICD-10-CM | POA: Diagnosis not present

## 2020-09-06 DIAGNOSIS — Z9889 Other specified postprocedural states: Secondary | ICD-10-CM | POA: Diagnosis not present

## 2020-09-06 DIAGNOSIS — Z95811 Presence of heart assist device: Secondary | ICD-10-CM | POA: Diagnosis not present

## 2020-09-07 DIAGNOSIS — N179 Acute kidney failure, unspecified: Secondary | ICD-10-CM | POA: Diagnosis not present

## 2020-09-07 DIAGNOSIS — Z95811 Presence of heart assist device: Secondary | ICD-10-CM | POA: Diagnosis not present

## 2020-09-07 DIAGNOSIS — T8132XA Disruption of internal operation (surgical) wound, not elsewhere classified, initial encounter: Secondary | ICD-10-CM | POA: Diagnosis not present

## 2020-09-07 DIAGNOSIS — T8189XA Other complications of procedures, not elsewhere classified, initial encounter: Secondary | ICD-10-CM | POA: Diagnosis not present

## 2020-09-07 DIAGNOSIS — I5022 Chronic systolic (congestive) heart failure: Secondary | ICD-10-CM | POA: Diagnosis not present

## 2020-09-07 DIAGNOSIS — D649 Anemia, unspecified: Secondary | ICD-10-CM | POA: Diagnosis not present

## 2020-09-07 DIAGNOSIS — K921 Melena: Secondary | ICD-10-CM | POA: Diagnosis not present

## 2020-09-07 DIAGNOSIS — T8140XA Infection following a procedure, unspecified, initial encounter: Secondary | ICD-10-CM | POA: Diagnosis not present

## 2020-09-08 DIAGNOSIS — I5022 Chronic systolic (congestive) heart failure: Secondary | ICD-10-CM | POA: Diagnosis not present

## 2020-09-08 DIAGNOSIS — Z95811 Presence of heart assist device: Secondary | ICD-10-CM | POA: Diagnosis not present

## 2020-09-08 DIAGNOSIS — N179 Acute kidney failure, unspecified: Secondary | ICD-10-CM | POA: Diagnosis not present

## 2020-09-08 DIAGNOSIS — T8132XA Disruption of internal operation (surgical) wound, not elsewhere classified, initial encounter: Secondary | ICD-10-CM | POA: Diagnosis not present

## 2020-09-08 DIAGNOSIS — T8140XA Infection following a procedure, unspecified, initial encounter: Secondary | ICD-10-CM | POA: Diagnosis not present

## 2020-09-10 ENCOUNTER — Telehealth: Payer: Self-pay

## 2020-09-10 DIAGNOSIS — Z951 Presence of aortocoronary bypass graft: Secondary | ICD-10-CM | POA: Diagnosis not present

## 2020-09-10 DIAGNOSIS — Z952 Presence of prosthetic heart valve: Secondary | ICD-10-CM | POA: Diagnosis not present

## 2020-09-10 NOTE — Telephone Encounter (Signed)
Transition Care Management Follow-up Telephone Call  Date of discharge and from where: 09/08/20 from [facility]  How have you been since you were released from the hospital? I am recovering well! I feel good! Pain felt is just part of the healing but none additional. Wound care appropriate. Denies N/V/D, fever, headache and all other symptoms.   Any questions or concerns? No  Items Reviewed:  Did the pt receive and understand the discharge instructions provided? Yes   Medications obtained and verified? Yes . Taking all medications without issues.   Other? Yes  No issues with picc line, LVAD, wound vac.  Any new allergies since your discharge? No   Dietary orders reviewed? Low sodium, heart healthy.   Do you have support at home? Yes   Home Care and Equipment/Supplies: Were home health services ordered? Yes If so, what is the name of the agency? [facility] Has the agency set up a time to come to the patient's home? Yes. 1 day weekly to drain line and change.  Were any new equipment or medical supplies ordered?  Yes: Home infusion equipment. Wound Vac.  Do you have any questions related to the use of the equipment or supplies? No  Functional Questionnaire: (I = Independent and D = Dependent) ADLs: i  Bathing/Dressing- i  Meal Prep- i  Eating- i  Maintaining continence- i  Transferring/Ambulation- walker in use as needed.   Managing Meds- i  Follow up appointments reviewed:   PCP Hospital f/u appt confirmed? Yes  Scheduled to see Dr. Derrel Nip on 09/23/20 @ 11:00.   [facility] f/u appt confirmed? Yes  Scheduled to see [facility] on Monday, Wed, Fri each week.   Are transportation arrangements needed? No   If their condition worsens, is the pt aware to call PCP or go to the Emergency Dept.? Yes  Was the patient provided with contact information for the PCP's office or ED? Yes  Was to pt encouraged to call back with questions or concerns? Yes

## 2020-09-13 DIAGNOSIS — T8140XA Infection following a procedure, unspecified, initial encounter: Secondary | ICD-10-CM | POA: Diagnosis not present

## 2020-09-13 DIAGNOSIS — Z95811 Presence of heart assist device: Secondary | ICD-10-CM | POA: Diagnosis not present

## 2020-09-13 DIAGNOSIS — A499 Bacterial infection, unspecified: Secondary | ICD-10-CM | POA: Diagnosis not present

## 2020-09-15 ENCOUNTER — Other Ambulatory Visit: Payer: Self-pay | Admitting: Internal Medicine

## 2020-09-15 DIAGNOSIS — T8149XD Infection following a procedure, other surgical site, subsequent encounter: Secondary | ICD-10-CM | POA: Diagnosis not present

## 2020-09-15 DIAGNOSIS — T8140XA Infection following a procedure, unspecified, initial encounter: Secondary | ICD-10-CM | POA: Diagnosis not present

## 2020-09-15 DIAGNOSIS — I5022 Chronic systolic (congestive) heart failure: Secondary | ICD-10-CM | POA: Diagnosis not present

## 2020-09-15 DIAGNOSIS — Z95811 Presence of heart assist device: Secondary | ICD-10-CM | POA: Diagnosis not present

## 2020-09-16 ENCOUNTER — Other Ambulatory Visit: Payer: Self-pay | Admitting: *Deleted

## 2020-09-16 ENCOUNTER — Encounter: Payer: Self-pay | Admitting: *Deleted

## 2020-09-16 NOTE — Patient Outreach (Signed)
[facility]) Care Management [facility] CM Telephone Outreach PCP office completes Transition of Care follow up post-hospital discharge Post- (most recent) hospital discharge day # 8  09/16/2020  Ashley [name] February 08, 1951 [id number]  Successful telephone outreach to Ashley [name], 69 y/o femalerecent[facility] for [facility].  Patient has history including, but not limited to, combined CHF; CAD with previous MI/ CABG, and ICD; HTN/ HLD; GERD; COPD;history oftobacco use-patient quit smoking June 19, 2019.  Unfortunately, patient experienced hospital re-admission 33 days after her last hospital discharge, October 27-September 08, 2020 for sternal wound infection post- LVAD insertion; patient had surgical would debridement 09/03/20 and was discharged home to self-care with home health services for wound vacc care- unfortunately, no home health services were available and patient has been self-managing wound vacc at home, as well as attending cardiology office for wound management three times per week, on M/ W/ F.  Patient also has PICC line in place and is self- managing IV antibiotics QD at home.  HIPAA/ identity verified and patient reports "doing pretty good;" states tired form attending so many/ frequent provider office visits, however, states "managing."  She sounds to be in no distress throughout call today and reports managing self-care/ equipment "well" at home, with ongoing assistance from her significant other, Ashley [name].  Reviewed recent/ upcoming provider appointments with patient- she has attended all as scheduled, continues to deny transportation issues.  Continues self- managing medications and denies issues around medications.  Reports has now started monitoring INR for warfarin dosing at home- reports going well.  Continues able to verbalize accurate understanding of signs/ symptoms CHF yellow zone, along with corresponding  action plan; discussed signs/ symptoms ongoing wound infection with patient and encouraged her to call care providers at [facility] promptly for any new concerns/ issues/ problems, and she confirms she has contact information for all and understands to promptly address any concerns that arise.  She confirms that she has continued monitoring and recording daily weights at home- reported as "160 lbs" consistently, noting that this weight now includes all of the equipment she carries, LVAD, wound vacc, etc.  Patient denies further issues, concerns, or problems today.  I confirmed that patient has my direct phone number, the main [facility] CM office phone number, and the [facility] CM 24-hour nurse advice phone number should issues arise prior to next scheduled [facility] CM outreach next month.  Encouraged patient to contact me directly if needs, questions, issues, or concerns arise prior to next scheduled outreach; patient agreed to do so.  Plan:  Patient will take medications as prescribed and will attend all scheduled provider appointments  Patient will promptly notify care providers for any new concerns/ issues/ problems that arise  Patient will continue monitoring/ recording daily weights   I will share today's [facility] CM note/ care plan with patient's PCP as [facility] CM initial assessment  [facility] CM outreach to continue with scheduled phone call next month, unless indicated sooner  Ashley Rack, RN, BSN, [place] Care Management  [phone]

## 2020-09-16 NOTE — Patient Instructions (Signed)
Goals Addressed            This Visit's Progress   . [facility] CM: Learn More About My Health   On track    Follow Up Date 10/14/2020    - tell my story and reason for my visit - make a list of questions - ask questions - repeat what I heard to make sure I understand - bring a list of my medicines to the visit - speak up when I don't understand    Why is this important?   The best way to learn about your health and care is by talking to the doctor and nurse.  They will answer your questions and give you information in the way that you like best.    Notes:   08/16/20:  -- Encouraged patient to clarify specific provider dietary recommendations post- LVAD insertion -- Encouraged patient to obtain guidance from care providers around timing of receiving [place] virus vaccine post- LVAD insertion -- Encouraged patient to ask provider about INR monitoring close to her home  09/16/20: -- encouraged patient to continue conversation with care providers around management of LVAD/ PICC line/ wound vacc at home; confirmed she continues attending all scheduled appointments and is able to verbalize overall plan of care/ action to take for problems -- confirmed she is monitoring INR at home -- discussed with patient management of wound vacc, signs/ symptoms infection along with corresponding action plan for same    . [facility] CM: Track and Manage Fluids and Swelling   On track    Follow Up Date 10/14/2020    - call office if I gain more than 2 pounds in one day or 5 pounds in one week - track weight in diary - use salt in moderation - watch for swelling in feet, ankles and legs every day - weigh myself daily    Why is this important?   It is important to check your weight daily and watch how much salt and liquids you have.  It will help you to manage your heart failure.    Notes:   09/16/20: -- confirmed patient continues monitoring and recording daily weights at home; reviewed recent weights  with patient- reports daily weights consistently at "160 lbs," which includes medical equipment (LVAD pack, wound vacc) -- confirmed continues following low salt diet -- confirmed no current clinical concerns/ signs/ symptoms CHF exacerbation; confirmed patient is able to verbalize appropriate action plan for weight gain, development of signs/ symptoms yellow CHF zone    . [facility] CM: Track and Manage Symptoms   On track    Follow Up Date 10/14/2020    - develop a rescue plan - eat more whole grains, fruits and vegetables, lean meats and healthy fats - follow rescue plan if symptoms flare-up - know when to call the doctor - track symptoms and what helps feel better or worse - dress right for the weather, hot or cold    Why is this important?   You will be able to handle your symptoms better if you keep track of them.  Making some simple changes to your lifestyle will help.  Eating healthy is one thing you can do to take good care of yourself.    Notes:   09/16/20: -- reiterated previously provided education including signs/ symptoms infection, post recent sternal wound infection -- confirmed no concerns around medications- reports has/ taking all as prescribed, including IV antibiotic via PICC -- reviewed established plan of action for development  of issues/ concerns/ problems      [name], RN, BSN, [employer] Coordinator [facility] Care Management  [phone]     Wound Infection A wound infection happens when tiny organisms (microorganisms) start to grow in a wound. A wound infection is most often caused by bacteria. Infection can cause the wound to break open or worsen. Wound infection needs treatment. If a wound infection is left untreated, complications can occur. Untreated wound infections may lead to an infection in the bloodstream (septicemia) or a bone infection (osteomyelitis). What are the causes? This condition is most often caused by bacteria  growing in a wound. Other microorganisms, like yeast and fungi, can also cause wound infections. What increases the risk? The following factors may make you more likely to develop this condition:  Having a weak body defense system (immune system).  Having diabetes.  Taking steroid medicines for a long time (chronic use).  Smoking.  Being an older person.  Being overweight.  Taking chemotherapy medicines. What are the signs or symptoms? Symptoms of this condition include:  Having more redness, swelling, or pain at the wound site.  Having more blood or fluid at the wound site.  A bad smell coming from a wound or bandage (dressing).  Having a fever.  Feeling tired or fatigued.  Having warmth at or around the wound.  Having pus at the wound site. How is this diagnosed? This condition is diagnosed with a medical history and physical exam. You may also have a wound culture or blood tests or both. How is this treated? This condition is usually treated with an antibiotic medicine.  The infection should improve 24-48 hours after you start antibiotics.  After 24-48 hours, redness around the wound should stop spreading, and the wound should be less painful. Follow these instructions at home: Medicines  Take or apply over-the-counter and prescription medicines only as told by your health care provider.  If you were prescribed an antibiotic medicine, take or apply it as told by your health care provider. Do not stop using the antibiotic even if you start to feel better. Wound care   Clean the wound each day, or as told by your health care provider. ? Wash the wound with mild soap and water. ? Rinse the wound with water to remove all soap. ? Pat the wound dry with a clean towel. Do not rub it.  Follow instructions from your health care provider about how to take care of your wound. Make sure you: ? Wash your hands with soap and water before and after you change your  dressing. If soap and water are not available, use hand sanitizer. ? Change your dressing as told by your health care provider. ? Leave stitches (sutures), skin glue, or adhesive strips in place if your wound has been closed. These skin closures may need to stay in place for 2 weeks or longer. If adhesive strip edges start to loosen and curl up, you may trim the loose edges. Do not remove adhesive strips completely unless your health care provider tells you to do that. Some wounds are left open to heal on their own.  Check your wound every day for signs of infection. Watch for: ? More redness, swelling, or pain. ? More fluid or blood. ? Warmth. ? Pus or a bad smell. General instructions  Keep the dressing dry until your health care provider says it can be removed.  Do not take baths, swim, or use a  hot tub until your health care provider approves. Ask your health care provider if you may take showers. You may only be allowed to take sponge baths.  Raise (elevate) the injured area above the level of your heart while you are sitting or lying down.  Do not scratch or pick at the wound.  Keep all follow-up visits as told by your health care provider. This is important. Contact a health care provider if:  Your pain is not controlled with medicine.  You have more redness, swelling, or pain around your wound.  You have more fluid or blood coming from your wound.  Your wound feels warm to the touch.  You have pus coming from your wound.  You continue to notice a bad smell coming from your wound or your dressing.  Your wound that was closed breaks open. Get help right away if:  You have a red streak going away from your wound.  You have a fever. Summary  A wound infection happens when tiny organisms (microorganisms) start to grow in a wound.  This condition is usually treated with an antibiotic medicine.  Follow instructions from your health care provider about how to take care  of your wound.  Contact a health care provider if your wound infection does not begin to improve in 24-48 hours, or your symptoms worsen.  Keep all follow-up visits as told by your health care provider. This is important. This information is not intended to replace advice given to you by your health care provider. Make sure you discuss any questions you have with your health care provider. Document Revised: 06/04/2018 Document Reviewed: 06/04/2018 Elsevier Patient Education  [place].

## 2020-09-17 DIAGNOSIS — Z4889 Encounter for other specified surgical aftercare: Secondary | ICD-10-CM | POA: Diagnosis not present

## 2020-09-17 DIAGNOSIS — Z95811 Presence of heart assist device: Secondary | ICD-10-CM | POA: Diagnosis not present

## 2020-09-20 ENCOUNTER — Other Ambulatory Visit: Payer: Self-pay | Admitting: Internal Medicine

## 2020-09-20 ENCOUNTER — Telehealth: Payer: Self-pay | Admitting: Internal Medicine

## 2020-09-20 DIAGNOSIS — Z95811 Presence of heart assist device: Secondary | ICD-10-CM | POA: Diagnosis not present

## 2020-09-20 DIAGNOSIS — T8140XA Infection following a procedure, unspecified, initial encounter: Secondary | ICD-10-CM | POA: Diagnosis not present

## 2020-09-20 MED ORDER — TRELEGY ELLIPTA 100-62.5-25 MCG/INH IN AEPB
1.0000 | INHALATION_SPRAY | Freq: Every day | RESPIRATORY_TRACT | 2 refills | Status: DC
Start: 2020-09-20 — End: 2020-10-25

## 2020-09-20 MED ORDER — LISINOPRIL 10 MG PO TABS
10.0000 mg | ORAL_TABLET | Freq: Every day | ORAL | 1 refills | Status: DC
Start: 2020-09-20 — End: 2020-10-07

## 2020-09-20 NOTE — Addendum Note (Signed)
Addended by: Elpidio Galea T on: 09/20/2020 11:06 AM   Modules accepted: Orders

## 2020-09-20 NOTE — Telephone Encounter (Signed)
error 

## 2020-09-20 NOTE — Telephone Encounter (Signed)
[facility] called pt needs a refill on lisinopril (ZESTRIL) 10 MG tablet she told them that Dr.[name] increased her to 2-10mg  tablets a day Also needs a refill on Fluticasone-Umeclidin-Vilant (TRELEGY ELLIPTA) 100-62.5-25 MCG/INH  Sent to [facility]

## 2020-09-22 DIAGNOSIS — R197 Diarrhea, unspecified: Secondary | ICD-10-CM | POA: Diagnosis not present

## 2020-09-22 DIAGNOSIS — I5089 Other heart failure: Secondary | ICD-10-CM | POA: Diagnosis not present

## 2020-09-22 DIAGNOSIS — J811 Chronic pulmonary edema: Secondary | ICD-10-CM | POA: Diagnosis not present

## 2020-09-22 DIAGNOSIS — I110 Hypertensive heart disease with heart failure: Secondary | ICD-10-CM | POA: Diagnosis not present

## 2020-09-22 DIAGNOSIS — R42 Dizziness and giddiness: Secondary | ICD-10-CM | POA: Diagnosis not present

## 2020-09-22 DIAGNOSIS — Z452 Encounter for adjustment and management of vascular access device: Secondary | ICD-10-CM | POA: Diagnosis not present

## 2020-09-22 DIAGNOSIS — Z85038 Personal history of other malignant neoplasm of large intestine: Secondary | ICD-10-CM | POA: Diagnosis not present

## 2020-09-22 DIAGNOSIS — T8509XA Other mechanical complication of ventricular intracranial (communicating) shunt, initial encounter: Secondary | ICD-10-CM | POA: Diagnosis not present

## 2020-09-23 ENCOUNTER — Inpatient Hospital Stay: Payer: Medicare Other | Admitting: Internal Medicine

## 2020-09-23 DIAGNOSIS — Z952 Presence of prosthetic heart valve: Secondary | ICD-10-CM | POA: Diagnosis not present

## 2020-09-23 DIAGNOSIS — J9811 Atelectasis: Secondary | ICD-10-CM | POA: Diagnosis not present

## 2020-09-23 DIAGNOSIS — T829XXA Unspecified complication of cardiac and vascular prosthetic device, implant and graft, initial encounter: Secondary | ICD-10-CM | POA: Insufficient documentation

## 2020-09-23 DIAGNOSIS — K921 Melena: Secondary | ICD-10-CM | POA: Diagnosis not present

## 2020-09-23 DIAGNOSIS — I517 Cardiomegaly: Secondary | ICD-10-CM | POA: Diagnosis not present

## 2020-09-23 DIAGNOSIS — Z452 Encounter for adjustment and management of vascular access device: Secondary | ICD-10-CM | POA: Diagnosis not present

## 2020-09-23 DIAGNOSIS — Z85038 Personal history of other malignant neoplasm of large intestine: Secondary | ICD-10-CM | POA: Diagnosis not present

## 2020-09-24 DIAGNOSIS — Z85038 Personal history of other malignant neoplasm of large intestine: Secondary | ICD-10-CM | POA: Diagnosis not present

## 2020-09-24 DIAGNOSIS — E1122 Type 2 diabetes mellitus with diabetic chronic kidney disease: Secondary | ICD-10-CM | POA: Diagnosis not present

## 2020-09-24 DIAGNOSIS — K921 Melena: Secondary | ICD-10-CM | POA: Diagnosis not present

## 2020-09-24 DIAGNOSIS — T182XXA Foreign body in stomach, initial encounter: Secondary | ICD-10-CM | POA: Diagnosis not present

## 2020-09-24 DIAGNOSIS — I509 Heart failure, unspecified: Secondary | ICD-10-CM | POA: Diagnosis not present

## 2020-09-24 DIAGNOSIS — N189 Chronic kidney disease, unspecified: Secondary | ICD-10-CM | POA: Diagnosis not present

## 2020-09-24 NOTE — Telephone Encounter (Signed)
[facility] called to follow up on the medication status and to get clarification on the direction and dosage

## 2020-09-24 NOTE — Telephone Encounter (Signed)
LMTCB. Need to ask pt how she is taking her lisinopril and if she is currently taking the ferrous sulfate or the gluconate.

## 2020-09-25 DIAGNOSIS — Z85038 Personal history of other malignant neoplasm of large intestine: Secondary | ICD-10-CM | POA: Diagnosis not present

## 2020-09-26 DIAGNOSIS — Z85038 Personal history of other malignant neoplasm of large intestine: Secondary | ICD-10-CM | POA: Diagnosis not present

## 2020-09-27 DIAGNOSIS — K3189 Other diseases of stomach and duodenum: Secondary | ICD-10-CM | POA: Diagnosis not present

## 2020-09-27 DIAGNOSIS — I255 Ischemic cardiomyopathy: Secondary | ICD-10-CM | POA: Diagnosis not present

## 2020-09-27 DIAGNOSIS — K259 Gastric ulcer, unspecified as acute or chronic, without hemorrhage or perforation: Secondary | ICD-10-CM | POA: Diagnosis not present

## 2020-09-27 DIAGNOSIS — K921 Melena: Secondary | ICD-10-CM | POA: Diagnosis not present

## 2020-09-27 DIAGNOSIS — N189 Chronic kidney disease, unspecified: Secondary | ICD-10-CM | POA: Diagnosis not present

## 2020-09-27 DIAGNOSIS — I509 Heart failure, unspecified: Secondary | ICD-10-CM | POA: Diagnosis not present

## 2020-09-27 DIAGNOSIS — D62 Acute posthemorrhagic anemia: Secondary | ICD-10-CM | POA: Diagnosis not present

## 2020-09-27 DIAGNOSIS — K31819 Angiodysplasia of stomach and duodenum without bleeding: Secondary | ICD-10-CM | POA: Diagnosis not present

## 2020-09-27 DIAGNOSIS — Z7901 Long term (current) use of anticoagulants: Secondary | ICD-10-CM | POA: Diagnosis not present

## 2020-09-27 DIAGNOSIS — Q2730 Arteriovenous malformation, site unspecified: Secondary | ICD-10-CM | POA: Diagnosis not present

## 2020-09-27 DIAGNOSIS — E1122 Type 2 diabetes mellitus with diabetic chronic kidney disease: Secondary | ICD-10-CM | POA: Diagnosis not present

## 2020-09-27 DIAGNOSIS — I5022 Chronic systolic (congestive) heart failure: Secondary | ICD-10-CM | POA: Diagnosis not present

## 2020-09-28 DIAGNOSIS — Q2730 Arteriovenous malformation, site unspecified: Secondary | ICD-10-CM | POA: Diagnosis not present

## 2020-09-28 DIAGNOSIS — I255 Ischemic cardiomyopathy: Secondary | ICD-10-CM | POA: Diagnosis not present

## 2020-09-28 DIAGNOSIS — Z7901 Long term (current) use of anticoagulants: Secondary | ICD-10-CM | POA: Diagnosis not present

## 2020-09-28 DIAGNOSIS — I5022 Chronic systolic (congestive) heart failure: Secondary | ICD-10-CM | POA: Diagnosis not present

## 2020-09-28 DIAGNOSIS — D62 Acute posthemorrhagic anemia: Secondary | ICD-10-CM | POA: Diagnosis not present

## 2020-09-29 DIAGNOSIS — I255 Ischemic cardiomyopathy: Secondary | ICD-10-CM | POA: Diagnosis not present

## 2020-09-29 DIAGNOSIS — Q2730 Arteriovenous malformation, site unspecified: Secondary | ICD-10-CM | POA: Diagnosis not present

## 2020-09-29 DIAGNOSIS — Z7901 Long term (current) use of anticoagulants: Secondary | ICD-10-CM | POA: Diagnosis not present

## 2020-09-29 DIAGNOSIS — D62 Acute posthemorrhagic anemia: Secondary | ICD-10-CM | POA: Diagnosis not present

## 2020-09-29 DIAGNOSIS — I5022 Chronic systolic (congestive) heart failure: Secondary | ICD-10-CM | POA: Diagnosis not present

## 2020-09-30 DIAGNOSIS — I255 Ischemic cardiomyopathy: Secondary | ICD-10-CM | POA: Diagnosis not present

## 2020-09-30 DIAGNOSIS — Z7901 Long term (current) use of anticoagulants: Secondary | ICD-10-CM | POA: Diagnosis not present

## 2020-09-30 DIAGNOSIS — Q2730 Arteriovenous malformation, site unspecified: Secondary | ICD-10-CM | POA: Diagnosis not present

## 2020-09-30 DIAGNOSIS — D62 Acute posthemorrhagic anemia: Secondary | ICD-10-CM | POA: Diagnosis not present

## 2020-09-30 DIAGNOSIS — I5022 Chronic systolic (congestive) heart failure: Secondary | ICD-10-CM | POA: Diagnosis not present

## 2020-10-04 ENCOUNTER — Telehealth: Payer: Self-pay

## 2020-10-04 DIAGNOSIS — T8140XA Infection following a procedure, unspecified, initial encounter: Secondary | ICD-10-CM | POA: Diagnosis not present

## 2020-10-04 DIAGNOSIS — Z95811 Presence of heart assist device: Secondary | ICD-10-CM | POA: Diagnosis not present

## 2020-10-04 NOTE — Telephone Encounter (Signed)
Called patient to follow up with TOC. Hospital follow up currently scheduled with [facility]. [place] services in progress. Declines PCP follow up at this time. Nurse reminded of upcoming CCM appointment on 12/2. Patient requests increased quantity of Lisinopril due to written take 2 daily. Current written Rx allows 15 days of medication. Appointment noted edited at [facility] follow up.

## 2020-10-05 DIAGNOSIS — I272 Pulmonary hypertension, unspecified: Secondary | ICD-10-CM | POA: Diagnosis not present

## 2020-10-05 DIAGNOSIS — G4700 Insomnia, unspecified: Secondary | ICD-10-CM | POA: Diagnosis not present

## 2020-10-05 DIAGNOSIS — I5022 Chronic systolic (congestive) heart failure: Secondary | ICD-10-CM | POA: Diagnosis not present

## 2020-10-05 DIAGNOSIS — I255 Ischemic cardiomyopathy: Secondary | ICD-10-CM | POA: Diagnosis not present

## 2020-10-05 DIAGNOSIS — I2510 Atherosclerotic heart disease of native coronary artery without angina pectoris: Secondary | ICD-10-CM | POA: Diagnosis not present

## 2020-10-05 DIAGNOSIS — T8131XD Disruption of external operation (surgical) wound, not elsewhere classified, subsequent encounter: Secondary | ICD-10-CM | POA: Diagnosis not present

## 2020-10-05 DIAGNOSIS — D680 Von Willebrand's disease: Secondary | ICD-10-CM | POA: Diagnosis not present

## 2020-10-05 DIAGNOSIS — E785 Hyperlipidemia, unspecified: Secondary | ICD-10-CM | POA: Diagnosis not present

## 2020-10-05 DIAGNOSIS — Z792 Long term (current) use of antibiotics: Secondary | ICD-10-CM | POA: Diagnosis not present

## 2020-10-05 DIAGNOSIS — G8929 Other chronic pain: Secondary | ICD-10-CM | POA: Diagnosis not present

## 2020-10-05 DIAGNOSIS — N184 Chronic kidney disease, stage 4 (severe): Secondary | ICD-10-CM | POA: Diagnosis not present

## 2020-10-05 DIAGNOSIS — J449 Chronic obstructive pulmonary disease, unspecified: Secondary | ICD-10-CM | POA: Diagnosis not present

## 2020-10-05 DIAGNOSIS — E1122 Type 2 diabetes mellitus with diabetic chronic kidney disease: Secondary | ICD-10-CM | POA: Diagnosis not present

## 2020-10-05 DIAGNOSIS — D519 Vitamin B12 deficiency anemia, unspecified: Secondary | ICD-10-CM | POA: Diagnosis not present

## 2020-10-05 DIAGNOSIS — M109 Gout, unspecified: Secondary | ICD-10-CM | POA: Diagnosis not present

## 2020-10-05 DIAGNOSIS — E1151 Type 2 diabetes mellitus with diabetic peripheral angiopathy without gangrene: Secondary | ICD-10-CM | POA: Diagnosis not present

## 2020-10-05 DIAGNOSIS — I252 Old myocardial infarction: Secondary | ICD-10-CM | POA: Diagnosis not present

## 2020-10-05 DIAGNOSIS — K219 Gastro-esophageal reflux disease without esophagitis: Secondary | ICD-10-CM | POA: Diagnosis not present

## 2020-10-05 DIAGNOSIS — I130 Hypertensive heart and chronic kidney disease with heart failure and stage 1 through stage 4 chronic kidney disease, or unspecified chronic kidney disease: Secondary | ICD-10-CM | POA: Diagnosis not present

## 2020-10-05 DIAGNOSIS — Z7951 Long term (current) use of inhaled steroids: Secondary | ICD-10-CM | POA: Diagnosis not present

## 2020-10-05 DIAGNOSIS — Z7984 Long term (current) use of oral hypoglycemic drugs: Secondary | ICD-10-CM | POA: Diagnosis not present

## 2020-10-05 DIAGNOSIS — Z452 Encounter for adjustment and management of vascular access device: Secondary | ICD-10-CM | POA: Diagnosis not present

## 2020-10-05 DIAGNOSIS — D509 Iron deficiency anemia, unspecified: Secondary | ICD-10-CM | POA: Diagnosis not present

## 2020-10-05 DIAGNOSIS — E1142 Type 2 diabetes mellitus with diabetic polyneuropathy: Secondary | ICD-10-CM | POA: Diagnosis not present

## 2020-10-06 ENCOUNTER — Other Ambulatory Visit: Payer: Self-pay | Admitting: Internal Medicine

## 2020-10-06 DIAGNOSIS — J3489 Other specified disorders of nose and nasal sinuses: Secondary | ICD-10-CM | POA: Diagnosis not present

## 2020-10-06 DIAGNOSIS — Z792 Long term (current) use of antibiotics: Secondary | ICD-10-CM | POA: Diagnosis not present

## 2020-10-06 DIAGNOSIS — T8149XA Infection following a procedure, other surgical site, initial encounter: Secondary | ICD-10-CM | POA: Diagnosis not present

## 2020-10-06 DIAGNOSIS — Z95811 Presence of heart assist device: Secondary | ICD-10-CM | POA: Diagnosis not present

## 2020-10-06 DIAGNOSIS — R197 Diarrhea, unspecified: Secondary | ICD-10-CM | POA: Diagnosis not present

## 2020-10-07 ENCOUNTER — Ambulatory Visit: Payer: Medicare Other | Admitting: Pharmacist

## 2020-10-07 DIAGNOSIS — M109 Gout, unspecified: Secondary | ICD-10-CM

## 2020-10-07 DIAGNOSIS — E1140 Type 2 diabetes mellitus with diabetic neuropathy, unspecified: Secondary | ICD-10-CM

## 2020-10-07 DIAGNOSIS — I25700 Atherosclerosis of coronary artery bypass graft(s), unspecified, with unstable angina pectoris: Secondary | ICD-10-CM

## 2020-10-07 DIAGNOSIS — I5032 Chronic diastolic (congestive) heart failure: Secondary | ICD-10-CM

## 2020-10-07 DIAGNOSIS — I70213 Atherosclerosis of native arteries of extremities with intermittent claudication, bilateral legs: Secondary | ICD-10-CM

## 2020-10-07 MED ORDER — LISINOPRIL 20 MG PO TABS
20.0000 mg | ORAL_TABLET | Freq: Every day | ORAL | 1 refills | Status: DC
Start: 2020-10-07 — End: 2021-01-04

## 2020-10-07 NOTE — Chronic Care Management (AMB) (Signed)
Chronic Care Management   Pharmacy Note  10/07/2020 Name: Ashley [name] MRN: [id number] DOB: 1951-05-27   Subjective:  Ashley [name] is a 69 y.o. year old female who is a primary care patient of [name], [name], MD. The CCM team was consulted for assistance with chronic disease management and care coordination needs.    Engaged with patient by telephone for follow up visit in response to provider referral for pharmacy case management and/or care coordination services.   Consent to Services:  [name] was given information about Chronic Care Management services, agreed to services, and gave verbal consent prior to initiation of services on 06/03/2019. Please see initial visit note for detailed documentation.   SDOH (Social Determinants of Health) assessments and interventions performed:  SDOH Interventions     Most Recent Value  SDOH Interventions  Financial Strain Interventions Intervention Not Indicated       Objective:  Lab Results  Component Value Date   CREATININE 1.31 (H) 07/05/2020   CREATININE 1.77 (H) 06/21/2020   CREATININE 1.6 (A) 06/09/2020    Lab Results  Component Value Date   HGBA1C 5.9 07/05/2020       Component Value Date/Time   CHOL 155 01/01/2020 0328   CHOL 218 (H) 05/30/2013 0038   TRIG 70 01/01/2020 0328   TRIG 381 (H) 05/30/2013 0038   HDL 50 01/01/2020 0328   HDL 35 (L) 05/30/2013 0038   CHOLHDL 3.1 01/01/2020 0328   VLDL 14 01/01/2020 0328   VLDL 76 (H) 05/30/2013 0038   LDLCALC 91 01/01/2020 0328   LDLCALC 139 (H) 05/06/2018 1107   LDLCALC 107 (H) 05/30/2013 0038   LDLDIRECT 62.0 11/08/2018 1412    Lab Results  Component Value Date   LABURIC 10.4 (H) 11/19/2019     BP Readings from Last 3 Encounters:  07/05/20 102/70  05/07/20 140/87  01/01/20 121/73    Assessment/Interventions: Review of patient past medical history, allergies, medications, health status, including review of consultants reports, laboratory and other  test data, was performed as part of comprehensive evaluation and provision of chronic care management services.   Allergies  Allergen Reactions  . Vancomycin Nausea And Vomiting and Palpitations  . Hydrocodone-Acetaminophen Nausea Only    Medications Reviewed Today    Reviewed by De Hollingshead, RPH-CPP (Pharmacist) on 10/07/20 at 1310  Med List Status: <None>  Medication Order Taking? Sig Documenting Provider Last Dose Status Informant  acetaminophen (TYLENOL) 325 MG tablet [id number] Yes Take 650 mg by mouth every 6 (six) hours as needed. [provider] Taking Active   albuterol (VENTOLIN HFA) 108 (90 Base) MCG/ACT inhaler [id number] Yes INHALE 2 PUFFS BY MOUTH INTO THE LUNGS EVERY 3 HOURS AS NEEDED FOR WHEEZING Ezekiel Slocumb, DO Taking Active Self           Med Note Nat Christen Oct 07, 2020  1:08 PM)    allopurinol (ZYLOPRIM) 100 MG tablet [id number] Yes TAKE ONE TABLET BY MOUTH ONCE DAILY [name], MD Taking Active   ALPRAZolam Duanne Moron) 0.5 MG tablet [id number] Yes TAKE 1/2 TO 1 TABLET BY MOUTH AT BEDTIME AS NEEDED FOR ANXIETY [name], MD Taking Active   amiodarone (PACERONE) 200 MG tablet [id number] Yes Take 200 mg by mouth daily. [provider] Taking Active   amitriptyline (ELAVIL) 25 MG tablet [id number] Yes Take 1 tablet (25 mg total) by mouth at bedtime. [name], MD Taking Active   aspirin EC  81 MG tablet [id number] No Take 81 mg by mouth daily.  Patient not taking: Reported on 10/07/2020   [provider] Not Taking Active   atorvastatin (LIPITOR) 40 MG tablet [id number] Yes TAKE ONE TABLET BY MOUTH ONCE DAILY [name], MD Taking Active   colchicine 0.6 MG tablet [id number] No TAKE TWO TABLETS BY MOUTH AT FIRST SIGN OF GOUT FLARE FOLLOW BY 1 TABLET DAILY UNTIL GOUT FLARE RESOLVES  Patient not taking: Reported on 08/12/2020   [provider] Not Taking Active            Med Note De Hollingshead    Thu Jun 24, 2020  1:11 PM)    cyanocobalamin (,VITAMIN B-12,) 1000 MCG/ML injection [id number] Yes Inject 1 mL into the muscle monthly  Patient taking differently: Inject 1,000 mcg into the muscle every 30 (thirty) days.    [name], MD Taking Active Self  diphenhydrAMINE (DIPHENHIST) 25 mg capsule [id number] No Take 25 mg by mouth at bedtime as needed for allergies or sleep.   Patient not taking: Reported on 10/07/2020   [provider] Not Taking Active Self           Med Note Johny Drilling Sep 22, 2019  9:21 AM)    escitalopram (LEXAPRO) 10 MG tablet [id number] Yes TAKE ONE TABLET BY MOUTH ONCE DAILY [name], MD Taking Active   ferrous gluconate (FERGON) 324 MG tablet [id number] No Take by mouth.  Patient not taking: Reported on 10/07/2020   [provider] Not Taking Active         Discontinued 10/07/20 1310 (Completed Course)   Fluticasone-Umeclidin-Vilant (TRELEGY ELLIPTA) 100-62.5-25 MCG/INH AEPB [id number] Yes Inhale 1 puff into the lungs daily. [name], MD Taking Active   [facility] MUCUS ER 600 MG 12 hr tablet [id number] Yes Take 600 mg by mouth 2 (two) times daily. [provider] Taking Active   ipratropium-albuterol (DUONEB) 0.5-2.5 (3) MG/3ML SOLN [id number] Yes USE 1 VIAL VIA NEBULIZER EVERY 6 HOURS AS NEEDED [name], MD Taking Active            Med Note Nat Christen Jun 24, 2020  1:14 PM)    lisinopril (ZESTRIL) 20 MG tablet [id number] Yes Take 1 tablet (20 mg total) by mouth daily. [name], MD Taking Active   Magnesium Oxide 400 (240 Mg) MG TABS [id number] Yes Take 1 tablet by mouth daily. [provider] Taking Active   metFORMIN (GLUCOPHAGE-XR) 500 MG 24 hr tablet [id number] Yes TAKE TWO TABLETS BY MOUTH EVERY MORNING [name], MD Taking Active   omeprazole (PRILOSEC) 40 MG capsule [id number] Yes TAKE ONE CAPSULE BY MOUTH ONCE DAILY  Patient taking differently: Take 40 mg by mouth in the  morning and at bedtime.    [name], MD Taking Active   potassium chloride (KLOR-CON) 10 MEQ tablet [id number] No Take 10 mEq by mouth 2 (two) times daily.  Patient not taking: Reported on 10/07/2020   [provider] Not Taking Active   torsemide (DEMADEX) 10 MG tablet [id number] No Take 10 mg by mouth daily.   Patient not taking: Reported on 10/07/2020   Isaias Cowman, MD Not Taking Active Self           Med Note Nat Christen Jun 24, 2020  1:08 PM)    traZODone (DESYREL) 50 MG tablet [id number] Yes TAKE 1/2 TO 1  TABLET BY MOUTH EVERY EVENING 1 HOUR BEFORE BEDTIME [name], MD Taking Active   warfarin (COUMADIN) 2 MG tablet [id number] Yes Take 4 mg by mouth at bedtime. [provider] Taking Active           Patient Active Problem List   Diagnosis Date Noted  . 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 Snoring 07/05/2020  . Pressure injury of skin 01/01/2020  . Hypothermia 12/31/2019  . CKD (chronic kidney disease), stage IIIa 12/31/2019  . Pulmonary edema cardiac cause ([place]) 12/25/2019  . Hyperlipidemia   . COPD exacerbation ([place])   . Hyperkalemia   . Elevated troponin   . Acute renal failure superimposed on stage 3a chronic kidney disease ([place])   . Gouty arthritis of both feet 11/20/2019  . Mitral stenosis with insufficiency   . Hypertensive urgency   . Chronic renal failure, stage 3a ([place])   . Depression   . Acute on chronic respiratory failure with hypoxia ([place]) 09/22/2019  . Hyperglycemia 09/22/2019  . Presence of permanent cardiac pacemaker   . Anemia   . Chronic kidney disease   . Hypertension   . Mitral valve disorder 08/25/2019  . CHF (congestive heart failure) ([place]) 07/16/2019  . Cough with hemoptysis 06/09/2019  . Insomnia 05/07/2019  . Hypokalemia 03/23/2019  . Hyperglycemia, drug-induced 03/23/2019  . COVID-19 virus not detected 03/23/2019  . Respiratory failure ([place])  03/16/2019  . AVM (arteriovenous malformation) of small bowel, acquired   . Anemia, iron deficiency 11/10/2018  . Acute on chronic systolic CHF (congestive heart failure) ([place]) 11/06/2018  . Rectal polyp   . Benign neoplasm of cecum   . Barrett's esophagus without dysplasia   . Stomach irritation   . Chronic diastolic heart failure ([place]) 07/03/2018  . HTN (hypertension) 07/03/2018  . COPD with emphysema ([place]) 06/28/2018  . B12 deficiency anemia 06/28/2018  . Prediabetes 05/11/2018  . Personal history of colon cancer   . Benign neoplasm of descending colon   . Polyp of sigmoid colon   . Benign neoplasm of transverse colon   . Diverticulosis of large intestine without diverticulitis   . 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 Renal artery stenosis ([place]) 10/03/2016  . 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 Hospital discharge follow-up 02/09/2015  . Cough in adult 10/21/2014  . Tobacco abuse counseling 10/11/2014  . Tobacco abuse 10/11/2014  . Chronic right hip pain 08/26/2014  . COPD with acute exacerbation ([place]) 06/18/2013  . Atherosclerosis of native artery of extremity with intermittent claudication ([place]) 06/18/2013  . Preoperative evaluation to rule out surgical contraindication 06/18/2013  . CAD (coronary artery disease) 06/01/2013  . GERD (gastroesophageal reflux disease) 06/01/2013  . Hypercholesterolemia 06/01/2013  . Tubular adenoma of colon 06/01/2013  . Major depressive disorder in remission ([place]) 06/01/2013    Medication Assistance: None required. Patient affirms current coverage meets needs.     Patient Care Plan: Medication Management    Problem Identified: CAD, CHF, gout, mitral valve insufficiency, COPD     Long-Range Goal: Disease Progression Prevention   This Visit's Progress: On track  Priority: High  Note:   Current Barriers:  . Complex patient with high risk for hospital admission  Pharmacist Clinical Goal(s):   [name] Over the next 90 days, patient will achieve adherence to monitoring guidelines and medication adherence to achieve therapeutic efficacy. through collaboration with PharmD and provider.   Interventions: . Inter-disciplinary care team collaboration (see  longitudinal plan of care) . Comprehensive medication review performed; medication list updated in electronic medical record  HFrEF (LVAD) . Appropriately managed; current treatment: lisinopril 20 mg daily (requests updated dose be called in today); torsemide 10 mg + potassium 10 mEq only PRN; follows closely w/ [facility] cardiology; magnesium 400 mg daily; is unsure if she still should be taking ferrous gluconate iron supplement . Arrythmia: amiodarone 200 mg daily  . Warfarin 4 mg QPM - managed by cardiology . Reports feeling confident managing LVAD dressing changes at home w/ fiance . Recommended to continue current regimen w/ cardiology collaboration.  Confirmed lisinopril dosing via Care Everywhere, sent updated lisinopril script.   Hyperlipidemia, CAD (hx NSTEMI, s/p CABG x3 and mitral valve repair in 2007) MVR w/ replacement 2021 . Controlled; current treatment: atorvastatin 40 mg daily . Antiplatelet regimen: aspirin 81 mg daily  . Recommended to continue current regimen at this time  Chronic Obstructive Pulmonary Disease s/p tobacco cessation: . Controlled; current treatment: Trelegy 100/25/62.5 daily, albuterol nebulizer PRN.  [name] 0 exacerbations requiring treatment in the last 6 months  . Recommended to continue current regimen at this time  Gout: . Not at goal, last uric acid per Care Everywhere still >6. current treatment: allopurinol 100 mg daily. Reports she feels a gout flare coming on, and plans to take a dose of colchicine.  . Recommended to continue current regimen at this time. Consider updated uric acid moving forward, could consider titration of allopurinol.  Anxiety/Depression w/ Insomnia: . Appropriately managed; current  treatment: escitalopram 10 mg daily, amitriptyline 25 mg QPM, trazodone 50 mg QPM, PRN alprazolam 0.25 mg QPM . Encouraged to continue current regimen at this time.   Patient Goals/Self-Care Activities . Over the next 90 days, patient will:  - take medications as prescribed check blood pressure periodically, document, and provide at future appointments weigh daily, and contact provider if weight gain of >3 lbs/day, > 5 lbs/week  Follow Up Plan: Telephone follow up appointment with care management team member scheduled for: ~ 10 weeks      Plan: Telephone follow up appointment with care management team member scheduled for: ~ 10 weeks  [name] Darnelle Maffucci, PharmD, [place], [facility] Pharmacist [place] [phone]

## 2020-10-07 NOTE — Patient Instructions (Signed)
Visit Information    Patient Care Plan: Medication Management    Problem Identified: CAD, CHF, gout, mitral valve insufficiency, COPD     Long-Range Goal: Disease Progression Prevention   This Visit's Progress: On track  Priority: High  Note:   Current Barriers:   Complex patient with high risk for hospital admission  Pharmacist Clinical Goal(s):   Over the next 90 days, patient will achieve adherence to monitoring guidelines and medication adherence to achieve therapeutic efficacy. through collaboration with PharmD and provider.   Interventions:  Inter-disciplinary care team collaboration (see longitudinal plan of care)  Comprehensive medication review performed; medication list updated in electronic medical record  HFrEF (LVAD)  Appropriately managed; current treatment: lisinopril 20 mg daily (requests updated dose be called in today); torsemide 10 mg + potassium 10 mEq only PRN; follows closely w/ [facility] cardiology; magnesium 400 mg daily; is unsure if she still should be taking ferrous gluconate iron supplement  Arrythmia: amiodarone 200 mg daily   Warfarin 4 mg QPM - managed by cardiology  Reports feeling confident managing LVAD dressing changes at home w/ fiance  Recommended to continue current regimen w/ cardiology collaboration.  Confirmed lisinopril dosing via Care Everywhere, sent updated lisinopril script.   Hyperlipidemia, CAD (hx NSTEMI, s/p CABG x3 and mitral valve repair in 2007) MVR w/ replacement 2021  Controlled; current treatment: atorvastatin 40 mg daily  Antiplatelet regimen: aspirin 81 mg daily   Recommended to continue current regimen at this time  Chronic Obstructive Pulmonary Disease s/p tobacco cessation:  Controlled; current treatment: Trelegy 100/25/62.5 daily, albuterol nebulizer PRN.   0 exacerbations requiring treatment in the last 6 months   Recommended to continue current regimen at this time  Gout:  Not at goal, last uric acid per  Care Everywhere still >6. current treatment: allopurinol 100 mg daily. Reports she feels a gout flare coming on, and plans to take a dose of colchicine.   Recommended to continue current regimen at this time. Consider updated uric acid moving forward, could consider titration of allopurinol.  Anxiety/Depression w/ Insomnia:  Appropriately managed; current treatment: escitalopram 10 mg daily, amitriptyline 25 mg QPM, trazodone 50 mg QPM, PRN alprazolam 0.25 mg QPM  Encouraged to continue current regimen at this time.   Patient Goals/Self-Care Activities  Over the next 90 days, patient will:  - take medications as prescribed check blood pressure periodically, document, and provide at future appointments weigh daily, and contact provider if weight gain of >3 lbs/day, > 5 lbs/week  Follow Up Plan: Telephone follow up appointment with care management team member scheduled for: ~ 10 weeks     The patient verbalized understanding of instructions, educational materials, and care plan provided today and declined offer to receive copy of patient instructions, educational materials, and care plan.   Plan: Telephone follow up appointment with care management team member scheduled for: ~ 53 weeks  [name] Darnelle Maffucci, PharmD, [place], [place] Pharmacist [place] [phone]

## 2020-10-08 DIAGNOSIS — T8132XD Disruption of internal operation (surgical) wound, not elsewhere classified, subsequent encounter: Secondary | ICD-10-CM | POA: Diagnosis not present

## 2020-10-09 DIAGNOSIS — K219 Gastro-esophageal reflux disease without esophagitis: Secondary | ICD-10-CM | POA: Diagnosis not present

## 2020-10-09 DIAGNOSIS — E1122 Type 2 diabetes mellitus with diabetic chronic kidney disease: Secondary | ICD-10-CM | POA: Diagnosis not present

## 2020-10-09 DIAGNOSIS — D509 Iron deficiency anemia, unspecified: Secondary | ICD-10-CM | POA: Diagnosis not present

## 2020-10-09 DIAGNOSIS — G8929 Other chronic pain: Secondary | ICD-10-CM | POA: Diagnosis not present

## 2020-10-09 DIAGNOSIS — D519 Vitamin B12 deficiency anemia, unspecified: Secondary | ICD-10-CM | POA: Diagnosis not present

## 2020-10-09 DIAGNOSIS — N184 Chronic kidney disease, stage 4 (severe): Secondary | ICD-10-CM | POA: Diagnosis not present

## 2020-10-09 DIAGNOSIS — I252 Old myocardial infarction: Secondary | ICD-10-CM | POA: Diagnosis not present

## 2020-10-09 DIAGNOSIS — I255 Ischemic cardiomyopathy: Secondary | ICD-10-CM | POA: Diagnosis not present

## 2020-10-09 DIAGNOSIS — T8131XD Disruption of external operation (surgical) wound, not elsewhere classified, subsequent encounter: Secondary | ICD-10-CM | POA: Diagnosis not present

## 2020-10-09 DIAGNOSIS — M109 Gout, unspecified: Secondary | ICD-10-CM | POA: Diagnosis not present

## 2020-10-09 DIAGNOSIS — D680 Von Willebrand's disease: Secondary | ICD-10-CM | POA: Diagnosis not present

## 2020-10-09 DIAGNOSIS — Z7951 Long term (current) use of inhaled steroids: Secondary | ICD-10-CM | POA: Diagnosis not present

## 2020-10-09 DIAGNOSIS — Z792 Long term (current) use of antibiotics: Secondary | ICD-10-CM | POA: Diagnosis not present

## 2020-10-09 DIAGNOSIS — J449 Chronic obstructive pulmonary disease, unspecified: Secondary | ICD-10-CM | POA: Diagnosis not present

## 2020-10-09 DIAGNOSIS — I2510 Atherosclerotic heart disease of native coronary artery without angina pectoris: Secondary | ICD-10-CM | POA: Diagnosis not present

## 2020-10-09 DIAGNOSIS — Z452 Encounter for adjustment and management of vascular access device: Secondary | ICD-10-CM | POA: Diagnosis not present

## 2020-10-09 DIAGNOSIS — E785 Hyperlipidemia, unspecified: Secondary | ICD-10-CM | POA: Diagnosis not present

## 2020-10-09 DIAGNOSIS — I272 Pulmonary hypertension, unspecified: Secondary | ICD-10-CM | POA: Diagnosis not present

## 2020-10-09 DIAGNOSIS — Z7984 Long term (current) use of oral hypoglycemic drugs: Secondary | ICD-10-CM | POA: Diagnosis not present

## 2020-10-09 DIAGNOSIS — E1142 Type 2 diabetes mellitus with diabetic polyneuropathy: Secondary | ICD-10-CM | POA: Diagnosis not present

## 2020-10-09 DIAGNOSIS — I130 Hypertensive heart and chronic kidney disease with heart failure and stage 1 through stage 4 chronic kidney disease, or unspecified chronic kidney disease: Secondary | ICD-10-CM | POA: Diagnosis not present

## 2020-10-09 DIAGNOSIS — I5022 Chronic systolic (congestive) heart failure: Secondary | ICD-10-CM | POA: Diagnosis not present

## 2020-10-09 DIAGNOSIS — E1151 Type 2 diabetes mellitus with diabetic peripheral angiopathy without gangrene: Secondary | ICD-10-CM | POA: Diagnosis not present

## 2020-10-09 DIAGNOSIS — G4700 Insomnia, unspecified: Secondary | ICD-10-CM | POA: Diagnosis not present

## 2020-10-11 DIAGNOSIS — Z792 Long term (current) use of antibiotics: Secondary | ICD-10-CM | POA: Diagnosis not present

## 2020-10-11 DIAGNOSIS — N184 Chronic kidney disease, stage 4 (severe): Secondary | ICD-10-CM | POA: Diagnosis not present

## 2020-10-11 DIAGNOSIS — G8929 Other chronic pain: Secondary | ICD-10-CM | POA: Diagnosis not present

## 2020-10-11 DIAGNOSIS — E1151 Type 2 diabetes mellitus with diabetic peripheral angiopathy without gangrene: Secondary | ICD-10-CM | POA: Diagnosis not present

## 2020-10-11 DIAGNOSIS — D519 Vitamin B12 deficiency anemia, unspecified: Secondary | ICD-10-CM | POA: Diagnosis not present

## 2020-10-11 DIAGNOSIS — E1142 Type 2 diabetes mellitus with diabetic polyneuropathy: Secondary | ICD-10-CM | POA: Diagnosis not present

## 2020-10-11 DIAGNOSIS — T8131XD Disruption of external operation (surgical) wound, not elsewhere classified, subsequent encounter: Secondary | ICD-10-CM | POA: Diagnosis not present

## 2020-10-11 DIAGNOSIS — I5022 Chronic systolic (congestive) heart failure: Secondary | ICD-10-CM | POA: Diagnosis not present

## 2020-10-11 DIAGNOSIS — I130 Hypertensive heart and chronic kidney disease with heart failure and stage 1 through stage 4 chronic kidney disease, or unspecified chronic kidney disease: Secondary | ICD-10-CM | POA: Diagnosis not present

## 2020-10-11 DIAGNOSIS — E1122 Type 2 diabetes mellitus with diabetic chronic kidney disease: Secondary | ICD-10-CM | POA: Diagnosis not present

## 2020-10-11 DIAGNOSIS — K219 Gastro-esophageal reflux disease without esophagitis: Secondary | ICD-10-CM | POA: Diagnosis not present

## 2020-10-11 DIAGNOSIS — J449 Chronic obstructive pulmonary disease, unspecified: Secondary | ICD-10-CM | POA: Diagnosis not present

## 2020-10-11 DIAGNOSIS — Z7984 Long term (current) use of oral hypoglycemic drugs: Secondary | ICD-10-CM | POA: Diagnosis not present

## 2020-10-11 DIAGNOSIS — M109 Gout, unspecified: Secondary | ICD-10-CM | POA: Diagnosis not present

## 2020-10-11 DIAGNOSIS — I272 Pulmonary hypertension, unspecified: Secondary | ICD-10-CM | POA: Diagnosis not present

## 2020-10-11 DIAGNOSIS — G4700 Insomnia, unspecified: Secondary | ICD-10-CM | POA: Diagnosis not present

## 2020-10-11 DIAGNOSIS — E785 Hyperlipidemia, unspecified: Secondary | ICD-10-CM | POA: Diagnosis not present

## 2020-10-11 DIAGNOSIS — D509 Iron deficiency anemia, unspecified: Secondary | ICD-10-CM | POA: Diagnosis not present

## 2020-10-11 DIAGNOSIS — I2510 Atherosclerotic heart disease of native coronary artery without angina pectoris: Secondary | ICD-10-CM | POA: Diagnosis not present

## 2020-10-11 DIAGNOSIS — Z7951 Long term (current) use of inhaled steroids: Secondary | ICD-10-CM | POA: Diagnosis not present

## 2020-10-11 DIAGNOSIS — I252 Old myocardial infarction: Secondary | ICD-10-CM | POA: Diagnosis not present

## 2020-10-11 DIAGNOSIS — D680 Von Willebrand's disease: Secondary | ICD-10-CM | POA: Diagnosis not present

## 2020-10-11 DIAGNOSIS — I255 Ischemic cardiomyopathy: Secondary | ICD-10-CM | POA: Diagnosis not present

## 2020-10-11 DIAGNOSIS — Z452 Encounter for adjustment and management of vascular access device: Secondary | ICD-10-CM | POA: Diagnosis not present

## 2020-10-13 DIAGNOSIS — T8140XA Infection following a procedure, unspecified, initial encounter: Secondary | ICD-10-CM | POA: Diagnosis not present

## 2020-10-13 DIAGNOSIS — Z95811 Presence of heart assist device: Secondary | ICD-10-CM | POA: Diagnosis not present

## 2020-10-14 ENCOUNTER — Encounter: Payer: Self-pay | Admitting: *Deleted

## 2020-10-14 ENCOUNTER — Other Ambulatory Visit: Payer: Self-pay | Admitting: *Deleted

## 2020-10-14 ENCOUNTER — Telehealth: Payer: Self-pay | Admitting: Internal Medicine

## 2020-10-14 DIAGNOSIS — E1142 Type 2 diabetes mellitus with diabetic polyneuropathy: Secondary | ICD-10-CM | POA: Diagnosis not present

## 2020-10-14 DIAGNOSIS — I2510 Atherosclerotic heart disease of native coronary artery without angina pectoris: Secondary | ICD-10-CM | POA: Diagnosis not present

## 2020-10-14 DIAGNOSIS — Z95811 Presence of heart assist device: Secondary | ICD-10-CM | POA: Diagnosis not present

## 2020-10-14 DIAGNOSIS — I252 Old myocardial infarction: Secondary | ICD-10-CM | POA: Diagnosis not present

## 2020-10-14 DIAGNOSIS — Z7951 Long term (current) use of inhaled steroids: Secondary | ICD-10-CM | POA: Diagnosis not present

## 2020-10-14 DIAGNOSIS — Q2730 Arteriovenous malformation, site unspecified: Secondary | ICD-10-CM | POA: Diagnosis not present

## 2020-10-14 DIAGNOSIS — T829XXA Unspecified complication of cardiac and vascular prosthetic device, implant and graft, initial encounter: Secondary | ICD-10-CM | POA: Diagnosis not present

## 2020-10-14 DIAGNOSIS — I4891 Unspecified atrial fibrillation: Secondary | ICD-10-CM | POA: Diagnosis not present

## 2020-10-14 DIAGNOSIS — D519 Vitamin B12 deficiency anemia, unspecified: Secondary | ICD-10-CM | POA: Diagnosis not present

## 2020-10-14 DIAGNOSIS — E1122 Type 2 diabetes mellitus with diabetic chronic kidney disease: Secondary | ICD-10-CM | POA: Diagnosis not present

## 2020-10-14 DIAGNOSIS — Z79899 Other long term (current) drug therapy: Secondary | ICD-10-CM | POA: Diagnosis not present

## 2020-10-14 DIAGNOSIS — Z452 Encounter for adjustment and management of vascular access device: Secondary | ICD-10-CM | POA: Diagnosis not present

## 2020-10-14 DIAGNOSIS — J90 Pleural effusion, not elsewhere classified: Secondary | ICD-10-CM | POA: Diagnosis not present

## 2020-10-14 DIAGNOSIS — I130 Hypertensive heart and chronic kidney disease with heart failure and stage 1 through stage 4 chronic kidney disease, or unspecified chronic kidney disease: Secondary | ICD-10-CM | POA: Diagnosis not present

## 2020-10-14 DIAGNOSIS — N183 Chronic kidney disease, stage 3 unspecified: Secondary | ICD-10-CM | POA: Diagnosis not present

## 2020-10-14 DIAGNOSIS — J069 Acute upper respiratory infection, unspecified: Secondary | ICD-10-CM | POA: Diagnosis not present

## 2020-10-14 DIAGNOSIS — M109 Gout, unspecified: Secondary | ICD-10-CM | POA: Diagnosis not present

## 2020-10-14 DIAGNOSIS — Z7982 Long term (current) use of aspirin: Secondary | ICD-10-CM | POA: Diagnosis not present

## 2020-10-14 DIAGNOSIS — E860 Dehydration: Secondary | ICD-10-CM | POA: Diagnosis not present

## 2020-10-14 DIAGNOSIS — E1151 Type 2 diabetes mellitus with diabetic peripheral angiopathy without gangrene: Secondary | ICD-10-CM | POA: Diagnosis not present

## 2020-10-14 DIAGNOSIS — J449 Chronic obstructive pulmonary disease, unspecified: Secondary | ICD-10-CM | POA: Diagnosis not present

## 2020-10-14 DIAGNOSIS — R911 Solitary pulmonary nodule: Secondary | ICD-10-CM | POA: Diagnosis not present

## 2020-10-14 DIAGNOSIS — K219 Gastro-esophageal reflux disease without esophagitis: Secondary | ICD-10-CM | POA: Diagnosis not present

## 2020-10-14 DIAGNOSIS — Z87891 Personal history of nicotine dependence: Secondary | ICD-10-CM | POA: Diagnosis not present

## 2020-10-14 DIAGNOSIS — Z85038 Personal history of other malignant neoplasm of large intestine: Secondary | ICD-10-CM | POA: Diagnosis not present

## 2020-10-14 DIAGNOSIS — G8929 Other chronic pain: Secondary | ICD-10-CM | POA: Diagnosis not present

## 2020-10-14 DIAGNOSIS — D509 Iron deficiency anemia, unspecified: Secondary | ICD-10-CM | POA: Diagnosis not present

## 2020-10-14 DIAGNOSIS — N184 Chronic kidney disease, stage 4 (severe): Secondary | ICD-10-CM | POA: Diagnosis not present

## 2020-10-14 DIAGNOSIS — I255 Ischemic cardiomyopathy: Secondary | ICD-10-CM | POA: Diagnosis not present

## 2020-10-14 DIAGNOSIS — T8142XA Infection following a procedure, deep incisional surgical site, initial encounter: Secondary | ICD-10-CM | POA: Diagnosis not present

## 2020-10-14 DIAGNOSIS — Z8719 Personal history of other diseases of the digestive system: Secondary | ICD-10-CM | POA: Diagnosis not present

## 2020-10-14 DIAGNOSIS — E785 Hyperlipidemia, unspecified: Secondary | ICD-10-CM | POA: Diagnosis not present

## 2020-10-14 DIAGNOSIS — Z7901 Long term (current) use of anticoagulants: Secondary | ICD-10-CM | POA: Diagnosis not present

## 2020-10-14 DIAGNOSIS — Z7984 Long term (current) use of oral hypoglycemic drugs: Secondary | ICD-10-CM | POA: Diagnosis not present

## 2020-10-14 DIAGNOSIS — I272 Pulmonary hypertension, unspecified: Secondary | ICD-10-CM | POA: Diagnosis not present

## 2020-10-14 DIAGNOSIS — J441 Chronic obstructive pulmonary disease with (acute) exacerbation: Secondary | ICD-10-CM | POA: Diagnosis not present

## 2020-10-14 DIAGNOSIS — K5900 Constipation, unspecified: Secondary | ICD-10-CM | POA: Diagnosis not present

## 2020-10-14 DIAGNOSIS — I5022 Chronic systolic (congestive) heart failure: Secondary | ICD-10-CM | POA: Diagnosis not present

## 2020-10-14 DIAGNOSIS — Z792 Long term (current) use of antibiotics: Secondary | ICD-10-CM | POA: Diagnosis not present

## 2020-10-14 DIAGNOSIS — D62 Acute posthemorrhagic anemia: Secondary | ICD-10-CM | POA: Diagnosis not present

## 2020-10-14 DIAGNOSIS — R791 Abnormal coagulation profile: Secondary | ICD-10-CM | POA: Diagnosis not present

## 2020-10-14 DIAGNOSIS — G4700 Insomnia, unspecified: Secondary | ICD-10-CM | POA: Diagnosis not present

## 2020-10-14 DIAGNOSIS — K921 Melena: Secondary | ICD-10-CM | POA: Diagnosis not present

## 2020-10-14 DIAGNOSIS — T8131XD Disruption of external operation (surgical) wound, not elsewhere classified, subsequent encounter: Secondary | ICD-10-CM | POA: Diagnosis not present

## 2020-10-14 DIAGNOSIS — D680 Von Willebrand's disease: Secondary | ICD-10-CM | POA: Diagnosis not present

## 2020-10-14 NOTE — Patient Instructions (Signed)
Goals Addressed            This Visit's Progress   . COMPLETED: THN CM: Learn More About My Health   On track    Follow Up Date 10/14/2020    - tell my story and reason for my visit - make a list of questions - ask questions - repeat what I heard to make sure I understand - bring a list of my medicines to the visit - speak up when I don't understand    Why is this important?   The best way to learn about your health and care is by talking to the doctor and nurse.  They will answer your questions and give you information in the way that you like best.    Notes:   08/16/20:  -- Encouraged patient to clarify specific provider dietary recommendations post- LVAD insertion -- Encouraged patient to obtain guidance from care providers around timing of receiving Corona virus vaccine post- LVAD insertion -- Encouraged patient to ask provider about INR monitoring close to her home  09/16/20: -- encouraged patient to continue conversation with care providers around management of LVAD/ PICC line/ wound vacc at home; confirmed she continues attending all scheduled appointments and is able to verbalize overall plan of care/ action to take for problems -- confirmed she is monitoring INR at home -- discussed with patient management of wound vacc, signs/ symptoms infection along with corresponding action plan for same  10/14/20: -- great job at following your established plan of care; please promptly contact your care providers for any new concerns that arise    . THN CM: Track and Manage Fluids and Swelling   On track    Follow Up Date 11/15/2020    - call office if I gain more than 2 pounds in one day or 5 pounds in one week - track weight in diary - use salt in moderation - watch for swelling in feet, ankles and legs every day - weigh myself daily    Why is this important?   It is important to check your weight daily and watch how much salt and liquids you have.  It will help you to  manage your heart failure.    Notes:   09/16/20: -- confirmed patient continues monitoring and recording daily weights at home; reviewed recent weights with patient- reports daily weights consistently at "160 lbs," which includes medical equipment (LVAD pack, wound vacc) -- confirmed continues following low salt diet -- confirmed no current clinical concerns/ signs/ symptoms CHF exacerbation; confirmed patient is able to verbalize appropriate action plan for weight gain, development of signs/ symptoms yellow CHF zone  10/14/20: -- reviewed recent hospitalization for GIB with patient; confirmed that she has ongoing home health services for prior wound dehiscence and that wound vac remains removed; confirmed patient is completing dressing changes at home according to recommended post-discharge instructions -- discussed patient's ongoing "cold" symptoms and confirmed that cardiology/ LVAD providers are aware -- confirmed patient has contact information for LVAD team at [facility] -- confirmed patient has scheduled cardiology/ LVAD provider appointment 10/20/20 with plans to attend as scheduled -- encouraged patient to consider scheduling PCP appointment -- reviewed with patient recent daily weights at home: 156.6 lbs today; remains in goal range of < 160 lbs -- reviewed with patient established action plan for development of concerns at home: encouraged patient to promptly report new concerns/ issues/ problems    . THN CM: Track and Manage Symptoms   On  track    Follow Up Date 11/15/2020   - develop a rescue plan - eat more whole grains, fruits and vegetables, lean meats and healthy fats - follow rescue plan if symptoms flare-up - know when to call the doctor - track symptoms and what helps feel better or worse - dress right for the weather, hot or cold    Why is this important?   You will be able to handle your symptoms better if you keep track of them.  Making some simple changes to your  lifestyle will help.  Eating healthy is one thing you can do to take good care of yourself.    Notes:   09/16/20: -- reiterated previously provided education including signs/ symptoms infection, post recent sternal wound infection -- confirmed no concerns around medications- reports has/ taking all as prescribed, including IV antibiotic via PICC -- reviewed established plan of action for development of issues/ concerns/ problems  10/14/20: -- reviewed recent hospitalization for GIB with patient; confirmed that she has ongoing home health services for prior wound dehiscence and that wound vac remains removed; confirmed patient is completing dressing changes at home according to recommended post-discharge instructions -- discussed patient's ongoing "cold" symptoms and confirmed that cardiology/ LVAD providers are aware -- confirmed patient has contact information for LVAD team at [facility] -- confirmed patient has scheduled cardiology/ LVAD provider appointment 10/20/20 with plans to attend as scheduled -- encouraged patient to consider scheduling PCP appointment -- reviewed with patient recent daily weights at home: 156.6 lbs today; remains in goal range of < 160 lbs -- reviewed with patient established action plan for development of concerns at home: encouraged patient to promptly report new concerns/ issues/ problems       Gastrointestinal Bleeding Gastrointestinal (GI) bleeding is bleeding somewhere along the path that food travels through the body (digestive tract). This path is anywhere between the mouth and the opening of the butt (anus). You may have blood in your poop (stool) or have black poop. If you throw up (vomit), there may be blood in it. This condition can be mild, serious, or even life-threatening. If you have a lot of bleeding, you may need to stay in the hospital. What are the causes? This condition may be caused by:  Irritation and swelling of the esophagus (esophagitis).  The esophagus is part of the body that moves food from your mouth to your stomach.  Swollen veins in the butt (hemorrhoids).  Areas of painful tearing in the opening of the butt (anal fissures). These are often caused by passing hard poop.  Pouches that form on the colon over time (diverticulosis).  Irritation and swelling (diverticulitis) in areas where pouches have formed on the colon.  Growths (polyps) or cancer. Colon cancer often starts out as growths that are not cancer.  Irritation of the stomach lining (gastritis).  Sores (ulcers) in the stomach. What increases the risk? You are more likely to develop this condition if you:  Have a certain type of infection in your stomach (Helicobacter pylori infection).  Take certain medicines.  Smoke.  Drink alcohol. What are the signs or symptoms? Common symptoms of this condition include:  Throwing up (vomiting) material that has bright red blood in it. It may look like coffee grounds.  Changes in your poop. The poop may: ? Have red blood in it. ? Be black, look like tar, and smell stronger than normal. ? Be red.  Pain or cramping in the belly (abdomen). How is this  treated? Treatment for this condition depends on the cause of the bleeding. For example:  Sometimes, the bleeding can be stopped during a procedure that is done to find the problem (endoscopy or colonoscopy).  Medicines can be used to: ? Help control irritation, swelling, or infection. ? Reduce acid in your stomach.  Certain problems can be treated with: ? Creams. ? Medicines that are put in the butt (suppositories). ? Warm baths.  Surgery is sometimes needed.  If you lose a lot of blood, you may need a blood transfusion. If bleeding is mild, you may be allowed to go home. If there is a lot of bleeding, you will need to stay in the hospital. Follow these instructions at home:   Take over-the-counter and prescription medicines only as told by your  doctor.  Eat foods that have a lot of fiber in them. These foods include beans, whole grains, and fresh fruits and vegetables. You can also try eating 1-3 prunes each day.  Drink enough fluid to keep your pee (urine) pale yellow.  Keep all follow-up visits as told by your doctor. This is important. Contact a doctor if:  Your symptoms do not get better. Get help right away if:  Your bleeding does not stop.  You feel dizzy or you pass out (faint).  You feel weak.  You have very bad cramps in your back or belly.  You pass large clumps of blood (clots) in your poop.  Your symptoms are getting worse.  You have chest pain or fast heartbeats. Summary  GI bleeding is bleeding somewhere along the path that food travels through the body (digestive tract).  This bleeding can be caused by many things. Treatment depends on the cause of the bleeding.  Take medicines only as told by your doctor.  Keep all follow-up visits as told by your doctor. This is important. This information is not intended to replace advice given to you by your health care provider. Make sure you discuss any questions you have with your health care provider. Document Revised: 06/05/2018 Document Reviewed: 06/05/2018 Elsevier Patient Education  [place].   Wound Infection A wound infection happens when germs start to grow in a wound. Germs that cause wound infections are most often bacteria. Other types of infections can occur as well. An infection can cause the wound to break open. Wound infections need treatment. If a wound infection is not treated, problems can happen. What are the causes?  Most often caused by germs (bacteria) that grow in a wound.  Other germs, such as yeast and funguses, can also cause wound infections. What increases the risk?  Having a weak body defense system (immune system).  Having diabetes.  Taking certain medicines (steroids) for a long time.  Smoking.  Being an  older person.  Being overweight.  Taking certain medicines for cancer treatment. What are the signs or symptoms?  Having more redness, swelling, or pain at the wound site.  Having more blood or fluid at the wound site.  A bad smell coming from a wound or bandage (dressing).  Having a fever.  Feeling very tired.  Having warmth at or around the wound.  Having pus at the wound site. How is this treated?  This condition is most often treated with an antibiotic medicine. ? The infection should improve 24-48 hours after you start antibiotics. ? After 24-48 hours, redness around the wound should stop spreading. The wound should also be less painful. Follow these instructions at  home: Medicines  Take or apply over-the-counter and prescription medicines only as told by your doctor.  If you were prescribed an antibiotic medicine, take or apply it as told by your doctor. Do not stop using the antibiotic even if you start to feel better. Wound care   Clean the wound each day, or as told by your doctor. ? Wash the wound with mild soap and water. ? Rinse the wound with water to remove all soap. ? Pat the wound dry with a clean towel. Do not rub it.  Follow instructions from your doctor about how to take care of your wound. Make sure you: ? Wash your hands with soap and water before and after you change your bandage. If you cannot use soap and water, use hand sanitizer. ? Change your bandage as told by your doctor. ? Leave stitches (sutures), skin glue, or skin tape (adhesive) strips in place if your wound has been closed. They may need to stay in place for 2 weeks or longer. If tape strips get loose and curl up, you may trim the loose edges. Do not remove tape strips completely unless your doctor says it is okay. Some wounds are left open to heal on their own.  Check your wound every day for signs of infection. Watch for: ? More redness, swelling, or pain. ? More fluid or  blood. ? Warmth. ? Pus or a bad smell. General instructions  Keep the bandage dry until your doctor says it can be removed.  Do not take baths, swim, or use a hot tub until your doctor approves. Ask your doctor if you may take showers. You may only be allowed to take sponge baths.  Raise (elevate) the injured area above the level of your heart while you are sitting or lying down.  Do not scratch or pick at the wound.  Keep all follow-up visits as told by your doctor. This is important. Contact a doctor if:  Medicine does not help your pain.  You have more redness, swelling, or pain around your wound.  You have more fluid or blood coming from your wound.  Your wound feels warm to the touch.  You have pus coming from your wound.  You notice a bad smell coming from your wound or your bandage.  Your wound that was closed breaks open. Get help right away if:  You have a red streak going away from your wound.  You have a fever. Summary  A wound infection happens when germs start to grow in a wound.  This condition is usually treated with an antibiotic medicine.  Follow instructions from your doctor about how to take care of your wound.  Contact a doctor if your wound infection does not start to get better in 24-48 hours, or your symptoms get worse.  Keep all follow-up visits as told by your doctor. This is important. This information is not intended to replace advice given to you by your health care provider. Make sure you discuss any questions you have with your health care provider. Document Revised: 06/04/2018 Document Reviewed: 06/04/2018 Elsevier Patient Education  [place].

## 2020-10-14 NOTE — Patient Outreach (Signed)
Ashley [name]) Care Management THN CM Telephone Outreach,  PCP office completes Transition of Care follow up post-hospital discharge Post- (most recent) hospital discharge day # 14  10/14/2020  Ashley [name] [birth year] [id number]  Successful telephone outreach toDeborah Loyal [name], 69y/o female[facility].  Patient has history including, but not limited to, combined CHF; CAD with previous MI/ CABG, and ICD; HTN/ HLD; GERD; COPD;history oftobacco use-patient quit smoking June 19, 2019.  Unfortunately, patient experienced hospital re-admission 33 days after her last hospital discharge, October 27-September 08, 2020 for sternal wound infection post- LVAD insertion; patient had surgical would debridement 09/03/20 and was discharged home to self-care   Patient then began experiencing signs/ symptoms GI bleeding 14 days after last admission and was re-hospitalized at [facility] November 17-25, 2021 for treatment of same.  She was again discharged home to self-care with home health services in place.  Patient has PICC line in place and is self- managing IV antibiotics QD at home.  HIPAA/ identity verified and patient reports "not doing so great," states that she has had cold symptoms since she was last hospitalized; reports cardiology team aware and COVID/ flu testing was completed, both returned negative.  States symptoms slowly improving, but adds that she "threw back out" yesterday trying to get her dog inside; reports subsequent back pain today; using heat to manage.  -- home health services for wound assessment/ management continue: RN coming twice weekly- reports wound "almost completely healed up;" continues home IV antibiotics through PICC line -- scheduled provider office visit (cardiology/ LVAD) 10/20/20- verbalizes plans to attend as scheduled -- Continues self- managing medications and denies issues around  medications.   -- Continues monitoring INR for warfarin dosing at home- reports going well; she calls anticoagulation clinic with results; dosing adjusted as indicated -- sounds to be in no distress throughout call today and reports managing self-care/ equipment "well" at home, with ongoing assistance as indicated for iADL's from her significant other, Ashley [name], other family members.   -- reiterated and reviewed established plan of care/ action plan for development of concerns/ problems with patient: she is able to verbalize and requires ongoing support/ reinforcement  Patient denies further issues, concerns, or problems today. I confirmed that patient hasmy direct phone number, the main THN CM office phone number, and the [facility] CM 24-hour nurse advice phone number should issues arise prior to next scheduled THN CM outreach next month.  Encouraged patient to contact me directly if needs, questions, issues, or concerns arise prior to next scheduled outreach; patient agreed to do so.  Plan:  Patient will take medications as prescribed and will attend all scheduled provider appointments  Patient will promptly notify care providers for any new concerns/ issues/ problems that arise  Patient will continue monitoring/ recording daily weights   THN CM outreach to continue with scheduled phone call next month, unless indicated sooner  Ashley Rack, RN, BSN, [facility]  [phone]

## 2020-10-14 NOTE — Telephone Encounter (Signed)
LMTCB

## 2020-10-15 LAB — HEMOGLOBIN A1C: Hemoglobin A1C: 4.7

## 2020-10-18 DIAGNOSIS — Z8719 Personal history of other diseases of the digestive system: Secondary | ICD-10-CM | POA: Insufficient documentation

## 2020-10-24 DIAGNOSIS — Z792 Long term (current) use of antibiotics: Secondary | ICD-10-CM | POA: Diagnosis not present

## 2020-10-24 DIAGNOSIS — I130 Hypertensive heart and chronic kidney disease with heart failure and stage 1 through stage 4 chronic kidney disease, or unspecified chronic kidney disease: Secondary | ICD-10-CM | POA: Diagnosis not present

## 2020-10-24 DIAGNOSIS — N184 Chronic kidney disease, stage 4 (severe): Secondary | ICD-10-CM | POA: Diagnosis not present

## 2020-10-24 DIAGNOSIS — I255 Ischemic cardiomyopathy: Secondary | ICD-10-CM | POA: Diagnosis not present

## 2020-10-24 DIAGNOSIS — E1142 Type 2 diabetes mellitus with diabetic polyneuropathy: Secondary | ICD-10-CM | POA: Diagnosis not present

## 2020-10-24 DIAGNOSIS — I252 Old myocardial infarction: Secondary | ICD-10-CM | POA: Diagnosis not present

## 2020-10-24 DIAGNOSIS — I5022 Chronic systolic (congestive) heart failure: Secondary | ICD-10-CM | POA: Diagnosis not present

## 2020-10-24 DIAGNOSIS — Z452 Encounter for adjustment and management of vascular access device: Secondary | ICD-10-CM | POA: Diagnosis not present

## 2020-10-24 DIAGNOSIS — G4700 Insomnia, unspecified: Secondary | ICD-10-CM | POA: Diagnosis not present

## 2020-10-24 DIAGNOSIS — D509 Iron deficiency anemia, unspecified: Secondary | ICD-10-CM | POA: Diagnosis not present

## 2020-10-24 DIAGNOSIS — M109 Gout, unspecified: Secondary | ICD-10-CM | POA: Diagnosis not present

## 2020-10-24 DIAGNOSIS — I272 Pulmonary hypertension, unspecified: Secondary | ICD-10-CM | POA: Diagnosis not present

## 2020-10-24 DIAGNOSIS — T8131XD Disruption of external operation (surgical) wound, not elsewhere classified, subsequent encounter: Secondary | ICD-10-CM | POA: Diagnosis not present

## 2020-10-24 DIAGNOSIS — D680 Von Willebrand's disease: Secondary | ICD-10-CM | POA: Diagnosis not present

## 2020-10-24 DIAGNOSIS — I2510 Atherosclerotic heart disease of native coronary artery without angina pectoris: Secondary | ICD-10-CM | POA: Diagnosis not present

## 2020-10-24 DIAGNOSIS — J449 Chronic obstructive pulmonary disease, unspecified: Secondary | ICD-10-CM | POA: Diagnosis not present

## 2020-10-24 DIAGNOSIS — E1151 Type 2 diabetes mellitus with diabetic peripheral angiopathy without gangrene: Secondary | ICD-10-CM | POA: Diagnosis not present

## 2020-10-24 DIAGNOSIS — G8929 Other chronic pain: Secondary | ICD-10-CM | POA: Diagnosis not present

## 2020-10-24 DIAGNOSIS — E785 Hyperlipidemia, unspecified: Secondary | ICD-10-CM | POA: Diagnosis not present

## 2020-10-24 DIAGNOSIS — D519 Vitamin B12 deficiency anemia, unspecified: Secondary | ICD-10-CM | POA: Diagnosis not present

## 2020-10-24 DIAGNOSIS — E1122 Type 2 diabetes mellitus with diabetic chronic kidney disease: Secondary | ICD-10-CM | POA: Diagnosis not present

## 2020-10-24 DIAGNOSIS — K219 Gastro-esophageal reflux disease without esophagitis: Secondary | ICD-10-CM | POA: Diagnosis not present

## 2020-10-24 DIAGNOSIS — Z7984 Long term (current) use of oral hypoglycemic drugs: Secondary | ICD-10-CM | POA: Diagnosis not present

## 2020-10-24 DIAGNOSIS — Z7951 Long term (current) use of inhaled steroids: Secondary | ICD-10-CM | POA: Diagnosis not present

## 2020-10-25 ENCOUNTER — Other Ambulatory Visit: Payer: Self-pay | Admitting: Internal Medicine

## 2020-10-26 ENCOUNTER — Telehealth: Payer: Self-pay

## 2020-10-26 DIAGNOSIS — T829XXA Unspecified complication of cardiac and vascular prosthetic device, implant and graft, initial encounter: Secondary | ICD-10-CM | POA: Diagnosis not present

## 2020-10-26 NOTE — Telephone Encounter (Signed)
Transition Care Management Unsuccessful Follow-up Telephone Call  Date of discharge and from where:  10/22/20 from [facility]  Attempts:  1st Attempt  Reason for unsuccessful TCM follow-up call:  Unable to reach patient

## 2020-10-27 NOTE — Telephone Encounter (Signed)
Spoke with patient and hospital f/u labs have been drawn by [facility]. Patient is scheduled for hospital f/u at [facility] 11/2020 and declines in office at this time. Notes she is doing well and will call as needed and if anything changes.

## 2020-10-28 DIAGNOSIS — M109 Gout, unspecified: Secondary | ICD-10-CM | POA: Diagnosis not present

## 2020-10-28 DIAGNOSIS — E1151 Type 2 diabetes mellitus with diabetic peripheral angiopathy without gangrene: Secondary | ICD-10-CM | POA: Diagnosis not present

## 2020-10-28 DIAGNOSIS — G8929 Other chronic pain: Secondary | ICD-10-CM | POA: Diagnosis not present

## 2020-10-28 DIAGNOSIS — E1122 Type 2 diabetes mellitus with diabetic chronic kidney disease: Secondary | ICD-10-CM | POA: Diagnosis not present

## 2020-10-28 DIAGNOSIS — N184 Chronic kidney disease, stage 4 (severe): Secondary | ICD-10-CM | POA: Diagnosis not present

## 2020-10-28 DIAGNOSIS — K219 Gastro-esophageal reflux disease without esophagitis: Secondary | ICD-10-CM | POA: Diagnosis not present

## 2020-10-28 DIAGNOSIS — I5022 Chronic systolic (congestive) heart failure: Secondary | ICD-10-CM | POA: Diagnosis not present

## 2020-10-28 DIAGNOSIS — D680 Von Willebrand's disease: Secondary | ICD-10-CM | POA: Diagnosis not present

## 2020-10-28 DIAGNOSIS — D509 Iron deficiency anemia, unspecified: Secondary | ICD-10-CM | POA: Diagnosis not present

## 2020-10-28 DIAGNOSIS — Z7951 Long term (current) use of inhaled steroids: Secondary | ICD-10-CM | POA: Diagnosis not present

## 2020-10-28 DIAGNOSIS — D519 Vitamin B12 deficiency anemia, unspecified: Secondary | ICD-10-CM | POA: Diagnosis not present

## 2020-10-28 DIAGNOSIS — Z452 Encounter for adjustment and management of vascular access device: Secondary | ICD-10-CM | POA: Diagnosis not present

## 2020-10-28 DIAGNOSIS — J449 Chronic obstructive pulmonary disease, unspecified: Secondary | ICD-10-CM | POA: Diagnosis not present

## 2020-10-28 DIAGNOSIS — I272 Pulmonary hypertension, unspecified: Secondary | ICD-10-CM | POA: Diagnosis not present

## 2020-10-28 DIAGNOSIS — T8131XD Disruption of external operation (surgical) wound, not elsewhere classified, subsequent encounter: Secondary | ICD-10-CM | POA: Diagnosis not present

## 2020-10-28 DIAGNOSIS — I2510 Atherosclerotic heart disease of native coronary artery without angina pectoris: Secondary | ICD-10-CM | POA: Diagnosis not present

## 2020-10-28 DIAGNOSIS — E785 Hyperlipidemia, unspecified: Secondary | ICD-10-CM | POA: Diagnosis not present

## 2020-10-28 DIAGNOSIS — I252 Old myocardial infarction: Secondary | ICD-10-CM | POA: Diagnosis not present

## 2020-10-28 DIAGNOSIS — I130 Hypertensive heart and chronic kidney disease with heart failure and stage 1 through stage 4 chronic kidney disease, or unspecified chronic kidney disease: Secondary | ICD-10-CM | POA: Diagnosis not present

## 2020-10-28 DIAGNOSIS — Z792 Long term (current) use of antibiotics: Secondary | ICD-10-CM | POA: Diagnosis not present

## 2020-10-28 DIAGNOSIS — I255 Ischemic cardiomyopathy: Secondary | ICD-10-CM | POA: Diagnosis not present

## 2020-10-28 DIAGNOSIS — E1142 Type 2 diabetes mellitus with diabetic polyneuropathy: Secondary | ICD-10-CM | POA: Diagnosis not present

## 2020-10-28 DIAGNOSIS — Z7984 Long term (current) use of oral hypoglycemic drugs: Secondary | ICD-10-CM | POA: Diagnosis not present

## 2020-10-28 DIAGNOSIS — G4700 Insomnia, unspecified: Secondary | ICD-10-CM | POA: Diagnosis not present

## 2020-11-05 DIAGNOSIS — K219 Gastro-esophageal reflux disease without esophagitis: Secondary | ICD-10-CM | POA: Diagnosis not present

## 2020-11-05 DIAGNOSIS — I255 Ischemic cardiomyopathy: Secondary | ICD-10-CM | POA: Diagnosis not present

## 2020-11-05 DIAGNOSIS — M109 Gout, unspecified: Secondary | ICD-10-CM | POA: Diagnosis not present

## 2020-11-05 DIAGNOSIS — N184 Chronic kidney disease, stage 4 (severe): Secondary | ICD-10-CM | POA: Diagnosis not present

## 2020-11-05 DIAGNOSIS — E1151 Type 2 diabetes mellitus with diabetic peripheral angiopathy without gangrene: Secondary | ICD-10-CM | POA: Diagnosis not present

## 2020-11-05 DIAGNOSIS — G4700 Insomnia, unspecified: Secondary | ICD-10-CM | POA: Diagnosis not present

## 2020-11-05 DIAGNOSIS — J449 Chronic obstructive pulmonary disease, unspecified: Secondary | ICD-10-CM | POA: Diagnosis not present

## 2020-11-05 DIAGNOSIS — D519 Vitamin B12 deficiency anemia, unspecified: Secondary | ICD-10-CM | POA: Diagnosis not present

## 2020-11-05 DIAGNOSIS — T8131XD Disruption of external operation (surgical) wound, not elsewhere classified, subsequent encounter: Secondary | ICD-10-CM | POA: Diagnosis not present

## 2020-11-05 DIAGNOSIS — D680 Von Willebrand's disease: Secondary | ICD-10-CM | POA: Diagnosis not present

## 2020-11-05 DIAGNOSIS — I252 Old myocardial infarction: Secondary | ICD-10-CM | POA: Diagnosis not present

## 2020-11-05 DIAGNOSIS — I272 Pulmonary hypertension, unspecified: Secondary | ICD-10-CM | POA: Diagnosis not present

## 2020-11-05 DIAGNOSIS — D509 Iron deficiency anemia, unspecified: Secondary | ICD-10-CM | POA: Diagnosis not present

## 2020-11-05 DIAGNOSIS — Z452 Encounter for adjustment and management of vascular access device: Secondary | ICD-10-CM | POA: Diagnosis not present

## 2020-11-05 DIAGNOSIS — Z7984 Long term (current) use of oral hypoglycemic drugs: Secondary | ICD-10-CM | POA: Diagnosis not present

## 2020-11-05 DIAGNOSIS — G8929 Other chronic pain: Secondary | ICD-10-CM | POA: Diagnosis not present

## 2020-11-05 DIAGNOSIS — E785 Hyperlipidemia, unspecified: Secondary | ICD-10-CM | POA: Diagnosis not present

## 2020-11-05 DIAGNOSIS — I5022 Chronic systolic (congestive) heart failure: Secondary | ICD-10-CM | POA: Diagnosis not present

## 2020-11-05 DIAGNOSIS — I130 Hypertensive heart and chronic kidney disease with heart failure and stage 1 through stage 4 chronic kidney disease, or unspecified chronic kidney disease: Secondary | ICD-10-CM | POA: Diagnosis not present

## 2020-11-05 DIAGNOSIS — E1142 Type 2 diabetes mellitus with diabetic polyneuropathy: Secondary | ICD-10-CM | POA: Diagnosis not present

## 2020-11-05 DIAGNOSIS — I2510 Atherosclerotic heart disease of native coronary artery without angina pectoris: Secondary | ICD-10-CM | POA: Diagnosis not present

## 2020-11-05 DIAGNOSIS — Z792 Long term (current) use of antibiotics: Secondary | ICD-10-CM | POA: Diagnosis not present

## 2020-11-05 DIAGNOSIS — E1122 Type 2 diabetes mellitus with diabetic chronic kidney disease: Secondary | ICD-10-CM | POA: Diagnosis not present

## 2020-11-05 DIAGNOSIS — Z7951 Long term (current) use of inhaled steroids: Secondary | ICD-10-CM | POA: Diagnosis not present

## 2020-11-07 DIAGNOSIS — Z952 Presence of prosthetic heart valve: Secondary | ICD-10-CM | POA: Diagnosis not present

## 2020-11-07 DIAGNOSIS — Z7901 Long term (current) use of anticoagulants: Secondary | ICD-10-CM | POA: Diagnosis not present

## 2020-11-08 DIAGNOSIS — Z452 Encounter for adjustment and management of vascular access device: Secondary | ICD-10-CM | POA: Diagnosis not present

## 2020-11-08 DIAGNOSIS — E785 Hyperlipidemia, unspecified: Secondary | ICD-10-CM | POA: Diagnosis not present

## 2020-11-08 DIAGNOSIS — I2510 Atherosclerotic heart disease of native coronary artery without angina pectoris: Secondary | ICD-10-CM | POA: Diagnosis not present

## 2020-11-08 DIAGNOSIS — I5022 Chronic systolic (congestive) heart failure: Secondary | ICD-10-CM | POA: Diagnosis not present

## 2020-11-08 DIAGNOSIS — D680 Von Willebrand's disease: Secondary | ICD-10-CM | POA: Diagnosis not present

## 2020-11-08 DIAGNOSIS — J449 Chronic obstructive pulmonary disease, unspecified: Secondary | ICD-10-CM | POA: Diagnosis not present

## 2020-11-08 DIAGNOSIS — T8131XD Disruption of external operation (surgical) wound, not elsewhere classified, subsequent encounter: Secondary | ICD-10-CM | POA: Diagnosis not present

## 2020-11-08 DIAGNOSIS — I255 Ischemic cardiomyopathy: Secondary | ICD-10-CM | POA: Diagnosis not present

## 2020-11-08 DIAGNOSIS — E1122 Type 2 diabetes mellitus with diabetic chronic kidney disease: Secondary | ICD-10-CM | POA: Diagnosis not present

## 2020-11-08 DIAGNOSIS — Z7984 Long term (current) use of oral hypoglycemic drugs: Secondary | ICD-10-CM | POA: Diagnosis not present

## 2020-11-08 DIAGNOSIS — N184 Chronic kidney disease, stage 4 (severe): Secondary | ICD-10-CM | POA: Diagnosis not present

## 2020-11-08 DIAGNOSIS — E1142 Type 2 diabetes mellitus with diabetic polyneuropathy: Secondary | ICD-10-CM | POA: Diagnosis not present

## 2020-11-08 DIAGNOSIS — D509 Iron deficiency anemia, unspecified: Secondary | ICD-10-CM | POA: Diagnosis not present

## 2020-11-08 DIAGNOSIS — Z792 Long term (current) use of antibiotics: Secondary | ICD-10-CM | POA: Diagnosis not present

## 2020-11-08 DIAGNOSIS — D519 Vitamin B12 deficiency anemia, unspecified: Secondary | ICD-10-CM | POA: Diagnosis not present

## 2020-11-08 DIAGNOSIS — K219 Gastro-esophageal reflux disease without esophagitis: Secondary | ICD-10-CM | POA: Diagnosis not present

## 2020-11-08 DIAGNOSIS — G8929 Other chronic pain: Secondary | ICD-10-CM | POA: Diagnosis not present

## 2020-11-08 DIAGNOSIS — E1151 Type 2 diabetes mellitus with diabetic peripheral angiopathy without gangrene: Secondary | ICD-10-CM | POA: Diagnosis not present

## 2020-11-08 DIAGNOSIS — G4700 Insomnia, unspecified: Secondary | ICD-10-CM | POA: Diagnosis not present

## 2020-11-08 DIAGNOSIS — I252 Old myocardial infarction: Secondary | ICD-10-CM | POA: Diagnosis not present

## 2020-11-08 DIAGNOSIS — M109 Gout, unspecified: Secondary | ICD-10-CM | POA: Diagnosis not present

## 2020-11-08 DIAGNOSIS — I272 Pulmonary hypertension, unspecified: Secondary | ICD-10-CM | POA: Diagnosis not present

## 2020-11-08 DIAGNOSIS — Z7951 Long term (current) use of inhaled steroids: Secondary | ICD-10-CM | POA: Diagnosis not present

## 2020-11-08 DIAGNOSIS — I130 Hypertensive heart and chronic kidney disease with heart failure and stage 1 through stage 4 chronic kidney disease, or unspecified chronic kidney disease: Secondary | ICD-10-CM | POA: Diagnosis not present

## 2020-11-12 ENCOUNTER — Other Ambulatory Visit: Payer: Self-pay | Admitting: *Deleted

## 2020-11-12 ENCOUNTER — Encounter: Payer: Self-pay | Admitting: *Deleted

## 2020-11-12 NOTE — Patient Instructions (Signed)
Goals Addressed            This Visit's Progress   . THN CM: Track and Manage Fluids and Swelling       Follow Up Date 12/17/2020    - call office if I gain more than 2 pounds in one day or 5 pounds in one week - do ankle pumps when sitting - keep legs up while sitting - track weight in diary - use salt in moderation - watch for swelling in feet, ankles and legs every day - weigh myself daily    Why is this important?   It is important to check your weight daily and watch how much salt and liquids you have.  It will help you to manage your heart failure.    Notes:   09/16/20: -- confirmed patient continues monitoring and recording daily weights at home; reviewed recent weights with patient- reports daily weights consistently at "160 lbs," which includes medical equipment (LVAD pack, wound vacc) -- confirmed continues following low salt diet -- confirmed no current clinical concerns/ signs/ symptoms CHF exacerbation; confirmed patient is able to verbalize appropriate action plan for weight gain, development of signs/ symptoms yellow CHF zone  10/14/20: -- reviewed recent hospitalization for GIB with patient; confirmed that she has ongoing home health services for prior wound dehiscence and that wound vac remains removed; confirmed patient is completing dressing changes at home according to recommended post-discharge instructions -- discussed patient's ongoing "cold" symptoms and confirmed that cardiology/ LVAD providers are aware -- confirmed patient has contact information for LVAD team at [facility] -- confirmed patient has scheduled cardiology/ LVAD provider appointment 10/20/20 with plans to attend as scheduled -- encouraged patient to consider scheduling PCP appointment -- reviewed with patient recent daily weights at home: 156.6 lbs today; remains in goal range of < 160 lbs -- reviewed with patient established action plan for development of concerns at home: encouraged patient to  promptly report new concerns/ issues/ problems  11/12/20: -- great job managing your medications independently and continue monitoring and recording your daily weights and weekly INR's at home: keep up the great work -- I am glad to hear that you are recuperating well after reporting having the flu recently: if your symptoms worsen, or you have any set-backs, please contact your care providers right away -- I am so glad that your PICC line has been removed and that your wound is healing nicely! -- keep up the great work following a heart health low sodium diet -- I have placed a THN CM 2022 calendar booklet in the mail to you- I hope you find it useful in keeping up with your health information and doctor appointments    . THN CM: Track and Manage Symptoms       Follow Up Date 12/17/2020   - begin a heart failure diary - bring diary to all appointments - develop a rescue plan - eat more whole grains, fruits and vegetables, lean meats and healthy fats - follow rescue plan if symptoms flare-up - know when to call the doctor - track symptoms and what helps feel better or worse - dress right for the weather, hot or cold    Why is this important?   You will be able to handle your symptoms better if you keep track of them.  Making some simple changes to your lifestyle will help.  Eating healthy is one thing you can do to take good care of yourself.    Notes:  09/16/20: -- reiterated previously provided education including signs/ symptoms infection, post recent sternal wound infection -- confirmed no concerns around medications- reports has/ taking all as prescribed, including IV antibiotic via PICC -- reviewed established plan of action for development of issues/ concerns/ problems  10/14/20: -- reviewed recent hospitalization for GIB with patient; confirmed that she has ongoing home health services for prior wound dehiscence and that wound vac remains removed; confirmed patient is  completing dressing changes at home according to recommended post-discharge instructions -- discussed patient's ongoing "cold" symptoms and confirmed that cardiology/ LVAD providers are aware -- confirmed patient has contact information for LVAD team at [facility] -- confirmed patient has scheduled cardiology/ LVAD provider appointment 10/20/20 with plans to attend as scheduled -- encouraged patient to consider scheduling PCP appointment -- reviewed with patient recent daily weights at home: 156.6 lbs today; remains in goal range of < 160 lbs -- reviewed with patient established action plan for development of concerns at home: encouraged patient to promptly report new concerns/ issues/ problems  11/12/20: -- great job managing your medications independently and continue monitoring and recording your daily weights and weekly INR's at home: keep up the great work -- I am glad to hear that you are recuperating well after reporting having the flu recently: if your symptoms worsen, or you have any set-backs, please contact your care providers right away -- I am so glad that your PICC line has been removed and that your wound is healing nicely! -- keep up the great work following a heart health low sodium diet -- I have placed a THN CM 2022 calendar booklet in the mail to you- I hope you find it useful in keeping up with your health information and doctor appointments

## 2020-11-12 NOTE — Patient Outreach (Signed)
[facility]) Care Management THN CM Telephone Outreach,  PCP office completes Transition of Care follow up post-hospital discharge Post-hospital discharge day # 21  11/12/2020  Ashley [name] 1951-02-08 [id number]  Successful telephone outreach toDeborah Loyal [name], 70y/o [facility].  Patient has history including, but not limited to, combined CHF; CAD with previous MI/ CABG, and ICD; HTN/ HLD; GERD; COPD;history oftobacco use-patient quit smoking June 19, 2019.  Unfortunately, patient experiencedhospital re-admission October 27-September 08, 2020 for sternal wound infection post- LVAD insertion;patient had surgical would debridement 09/03/20 and was discharged home to self-care   14 days later she was re-hospitalized at [facility] November 17-25, 2021 for GI bleeding.  She was again discharged home to self-care with home health services in place.  Unfortunately, patient was again re-hospitalized 14 days after November discharge, December 9-17, 2021, again for GI bleeding.  She was discharged home to self-care with ongoing home health services in place.  HIPAA/ identity verified; patient reports doing "pretty good;" states she got the flu and is "finally recuperating;" did not obtain testing for flu, but reports "got sick" after being exposed to someone who had it without knowing it.  Reports PICC line now out, no longer taking antibiotics at home.  Reports ongoing home health visits; reports sternal dehiscence wound "healing great;" and she denies concerns around wound; not requiring wound vacuum, keeping open to air per instructions from LVAD provider.  Sounds to be in no distress thorughout phone call today.  -- discussed current  clinical condition post- recent hospital discharge and confirmed no current clinical or medication concerns; confirmed patient continues to manage medications independently at  home and reports adherence to medication regimen -- full post-hospital discharge medication review completed; medications updated in EHR according to patient report: encouraged patient's ongoing engagement with [name] embedded at PCP office -- confirmed patient able to verbalize importance/ rationale for daily weight monitoring/ recording at home and continues adherent to daily weight monitoring/ recording -- confirmed patient remains able to verbalize signs/ symptoms yellow CHF zone/ weight gain guidelines in setting of CHF along with corresponding action plan -- reviewed recent weights at home with patient: patient reports general weight ranges at home between 151-155 lbs (reports weight loss secondary to recuperating from flu) with weight this morning of 153.4 lbs -- confirmed patient continues consistently monitoring/ recording daily weights- positive reinforcement provided: continues able to verbalize action plan to take prn Torsemide for any daily weight > 163, along with potassium on days she takes-- reports has not needed diuretic recently -- confirmed patient continues to endorse following low salt, heart healthy diet -- SDOH updated, no concerns identified -- confirmed home health services remain active-- patient reports sternal dehiscence wound healing well; PICC line out; not requiring wound vaccuum -- confirmed patient continues monitoring INR at home independently -- reiterated signs/ symptoms GI bleeding along with corresponding action plan -- reviewed upcoming provider appointments with patient; mailed patient 2022 [facility] CM calendar booklet -- sent [facility] CM note/ care plan to PCP as quarterly update  Patient denies further issues, concerns, or problems today. I confirmed that patient hasmy direct phone number, the main [place] office phone number, and the [facility] CM 24-hour nurse advice phone number should issues arise prior to next scheduled THN CM outreach; again explained to  patient that another [facility] RN CM may be contacting next month, and she verbalizes understanding. Encouraged patient to contact me directly if needs, questions,  issues, or concerns arise prior to next scheduled outreach; patient agreed to do so.  Plan:  Patient will take medications as prescribed and will attend all scheduled provider appointments  Patient will promptly notify care providers for any new concerns/ issues/ problems that arise  Patient will continue monitoring/ recording daily weights and weekly INR at home  Patient will continue actively participating in home health services as ordered post- hospital discharge  I will send today's [facility] CM note/ care plan to PCP as quarterly update summary  THN CM outreach to continue with phone call nextmonth, unless indicated sooner  [name], RN, BSN, [place] Care Management  [phone]

## 2020-11-15 ENCOUNTER — Ambulatory Visit: Payer: Medicare Other | Admitting: *Deleted

## 2020-11-16 DIAGNOSIS — Z95811 Presence of heart assist device: Secondary | ICD-10-CM | POA: Diagnosis not present

## 2020-11-16 DIAGNOSIS — Z7901 Long term (current) use of anticoagulants: Secondary | ICD-10-CM | POA: Diagnosis not present

## 2020-11-16 DIAGNOSIS — Z952 Presence of prosthetic heart valve: Secondary | ICD-10-CM | POA: Diagnosis not present

## 2020-11-25 ENCOUNTER — Other Ambulatory Visit: Payer: Self-pay | Admitting: *Deleted

## 2020-11-25 DIAGNOSIS — E1140 Type 2 diabetes mellitus with diabetic neuropathy, unspecified: Secondary | ICD-10-CM | POA: Diagnosis not present

## 2020-11-25 DIAGNOSIS — N183 Chronic kidney disease, stage 3 unspecified: Secondary | ICD-10-CM | POA: Diagnosis not present

## 2020-11-25 DIAGNOSIS — N390 Urinary tract infection, site not specified: Secondary | ICD-10-CM | POA: Diagnosis not present

## 2020-11-25 DIAGNOSIS — D680 Von Willebrand's disease: Secondary | ICD-10-CM | POA: Diagnosis not present

## 2020-11-25 DIAGNOSIS — R0789 Other chest pain: Secondary | ICD-10-CM | POA: Diagnosis not present

## 2020-11-25 DIAGNOSIS — J441 Chronic obstructive pulmonary disease with (acute) exacerbation: Secondary | ICD-10-CM | POA: Diagnosis not present

## 2020-11-25 DIAGNOSIS — E785 Hyperlipidemia, unspecified: Secondary | ICD-10-CM | POA: Diagnosis not present

## 2020-11-25 DIAGNOSIS — I272 Pulmonary hypertension, unspecified: Secondary | ICD-10-CM | POA: Diagnosis not present

## 2020-11-25 DIAGNOSIS — J449 Chronic obstructive pulmonary disease, unspecified: Secondary | ICD-10-CM | POA: Diagnosis not present

## 2020-11-25 DIAGNOSIS — I5022 Chronic systolic (congestive) heart failure: Secondary | ICD-10-CM | POA: Diagnosis not present

## 2020-11-25 DIAGNOSIS — Z7951 Long term (current) use of inhaled steroids: Secondary | ICD-10-CM | POA: Diagnosis not present

## 2020-11-25 DIAGNOSIS — K219 Gastro-esophageal reflux disease without esophagitis: Secondary | ICD-10-CM | POA: Diagnosis not present

## 2020-11-25 DIAGNOSIS — Z95811 Presence of heart assist device: Secondary | ICD-10-CM | POA: Diagnosis not present

## 2020-11-25 DIAGNOSIS — I252 Old myocardial infarction: Secondary | ICD-10-CM | POA: Diagnosis not present

## 2020-11-25 DIAGNOSIS — E1151 Type 2 diabetes mellitus with diabetic peripheral angiopathy without gangrene: Secondary | ICD-10-CM | POA: Diagnosis not present

## 2020-11-25 DIAGNOSIS — T82897A Other specified complication of cardiac prosthetic devices, implants and grafts, initial encounter: Secondary | ICD-10-CM | POA: Diagnosis not present

## 2020-11-25 DIAGNOSIS — Z7984 Long term (current) use of oral hypoglycemic drugs: Secondary | ICD-10-CM | POA: Diagnosis not present

## 2020-11-25 DIAGNOSIS — Z452 Encounter for adjustment and management of vascular access device: Secondary | ICD-10-CM | POA: Diagnosis not present

## 2020-11-25 DIAGNOSIS — E1142 Type 2 diabetes mellitus with diabetic polyneuropathy: Secondary | ICD-10-CM | POA: Diagnosis not present

## 2020-11-25 DIAGNOSIS — G8929 Other chronic pain: Secondary | ICD-10-CM | POA: Diagnosis not present

## 2020-11-25 DIAGNOSIS — E861 Hypovolemia: Secondary | ICD-10-CM | POA: Diagnosis not present

## 2020-11-25 DIAGNOSIS — D509 Iron deficiency anemia, unspecified: Secondary | ICD-10-CM | POA: Diagnosis not present

## 2020-11-25 DIAGNOSIS — K5521 Angiodysplasia of colon with hemorrhage: Secondary | ICD-10-CM | POA: Diagnosis not present

## 2020-11-25 DIAGNOSIS — N189 Chronic kidney disease, unspecified: Secondary | ICD-10-CM | POA: Diagnosis not present

## 2020-11-25 DIAGNOSIS — R110 Nausea: Secondary | ICD-10-CM | POA: Diagnosis not present

## 2020-11-25 DIAGNOSIS — D62 Acute posthemorrhagic anemia: Secondary | ICD-10-CM | POA: Diagnosis not present

## 2020-11-25 DIAGNOSIS — Z7901 Long term (current) use of anticoagulants: Secondary | ICD-10-CM | POA: Diagnosis not present

## 2020-11-25 DIAGNOSIS — Z792 Long term (current) use of antibiotics: Secondary | ICD-10-CM | POA: Diagnosis not present

## 2020-11-25 DIAGNOSIS — K635 Polyp of colon: Secondary | ICD-10-CM | POA: Diagnosis not present

## 2020-11-25 DIAGNOSIS — I110 Hypertensive heart disease with heart failure: Secondary | ICD-10-CM | POA: Diagnosis not present

## 2020-11-25 DIAGNOSIS — E1122 Type 2 diabetes mellitus with diabetic chronic kidney disease: Secondary | ICD-10-CM | POA: Diagnosis not present

## 2020-11-25 DIAGNOSIS — G4700 Insomnia, unspecified: Secondary | ICD-10-CM | POA: Diagnosis not present

## 2020-11-25 DIAGNOSIS — R791 Abnormal coagulation profile: Secondary | ICD-10-CM | POA: Diagnosis not present

## 2020-11-25 DIAGNOSIS — M109 Gout, unspecified: Secondary | ICD-10-CM | POA: Diagnosis not present

## 2020-11-25 DIAGNOSIS — I2510 Atherosclerotic heart disease of native coronary artery without angina pectoris: Secondary | ICD-10-CM | POA: Diagnosis not present

## 2020-11-25 DIAGNOSIS — Z87891 Personal history of nicotine dependence: Secondary | ICD-10-CM | POA: Diagnosis not present

## 2020-11-25 DIAGNOSIS — I255 Ischemic cardiomyopathy: Secondary | ICD-10-CM | POA: Diagnosis not present

## 2020-11-25 DIAGNOSIS — I5089 Other heart failure: Secondary | ICD-10-CM | POA: Diagnosis not present

## 2020-11-25 DIAGNOSIS — N179 Acute kidney failure, unspecified: Secondary | ICD-10-CM | POA: Diagnosis not present

## 2020-11-25 DIAGNOSIS — D631 Anemia in chronic kidney disease: Secondary | ICD-10-CM | POA: Diagnosis not present

## 2020-11-25 DIAGNOSIS — I509 Heart failure, unspecified: Secondary | ICD-10-CM | POA: Diagnosis not present

## 2020-11-25 DIAGNOSIS — Z20822 Contact with and (suspected) exposure to covid-19: Secondary | ICD-10-CM | POA: Diagnosis not present

## 2020-11-25 DIAGNOSIS — K31819 Angiodysplasia of stomach and duodenum without bleeding: Secondary | ICD-10-CM | POA: Diagnosis not present

## 2020-11-25 DIAGNOSIS — D6489 Other specified anemias: Secondary | ICD-10-CM | POA: Diagnosis not present

## 2020-11-25 DIAGNOSIS — R911 Solitary pulmonary nodule: Secondary | ICD-10-CM | POA: Diagnosis not present

## 2020-11-25 DIAGNOSIS — K921 Melena: Secondary | ICD-10-CM | POA: Diagnosis not present

## 2020-11-25 DIAGNOSIS — N184 Chronic kidney disease, stage 4 (severe): Secondary | ICD-10-CM | POA: Diagnosis not present

## 2020-11-25 DIAGNOSIS — D519 Vitamin B12 deficiency anemia, unspecified: Secondary | ICD-10-CM | POA: Diagnosis not present

## 2020-11-25 DIAGNOSIS — T8131XD Disruption of external operation (surgical) wound, not elsewhere classified, subsequent encounter: Secondary | ICD-10-CM | POA: Diagnosis not present

## 2020-11-25 DIAGNOSIS — I130 Hypertensive heart and chronic kidney disease with heart failure and stage 1 through stage 4 chronic kidney disease, or unspecified chronic kidney disease: Secondary | ICD-10-CM | POA: Diagnosis not present

## 2020-11-25 NOTE — Patient Outreach (Signed)
[facility]) Care Management [place]- communication with [place] Pharmacist  11/25/2020  Ashley [name] 03/21/1951 [id number]   [facility] CM Telephone Outreach Care Coordination re:   Ashley [name], 69y/o [facility].  Patient has history including, but not limited to, combined CHF; CAD with previous MI/ CABG, and ICD; HTN/ HLD; GERD; COPD;history oftobacco use-patient quit smoking June 19, 2019.  Unfortunately, patient experiencedhospital re-admission October 27-September 08, 2020 for sternal wound infection post- LVAD insertion;patient had surgical would debridement 09/03/20 and was discharged home to self-care  14 days later she was re-hospitalized at [facility] November 17-25, 2091for GI bleeding. She was again discharged home to self-care with home health services in place.  Unfortunately, patient was again re-hospitalized 14 days after November discharge, December 9-17, 2021, again for GI bleeding.  She was discharged home to self-care with ongoing home health services in place.   Per [facility] CM leadership, sent request via secure messaging/ EHR to Chronic CM [facility] Pharmacist [name] Darnelle Maffucci, requesting ongoing follow up with patient into next month pending THN CM role changes.  [name], RN, BSN, [employer] [facility] Care Management  [phone]

## 2020-12-02 ENCOUNTER — Telehealth: Payer: Self-pay

## 2020-12-02 ENCOUNTER — Telehealth: Payer: Self-pay | Admitting: Pharmacist

## 2020-12-02 ENCOUNTER — Telehealth: Payer: Medicare Other

## 2020-12-02 NOTE — Telephone Encounter (Signed)
Noted  

## 2020-12-02 NOTE — Telephone Encounter (Signed)
  Chronic Care Management   Note  12/02/2020 Name: Ashley [name] MRN: [id number] DOB: [birth year]  Scheduled appointment today for CCM medication management follow up, however, patient is currently admitted at [facility].   Will collaborate with Care Guide to follow for discharge plan and outreach to r/s follow up.   [name] Darnelle Maffucci, PharmD, [place], [place] Clinical Pharmacist [employer] at [place]

## 2020-12-02 NOTE — Telephone Encounter (Signed)
[facility] called and scheduled pt hospital follow up on 12/23/19 with Dr. Derrel Nip

## 2020-12-03 ENCOUNTER — Telehealth: Payer: Self-pay

## 2020-12-03 NOTE — Telephone Encounter (Signed)
Transition Care Management Unsuccessful Follow-up Telephone Call  Date of discharge and from where:  12/02/20 from [facility]  Attempts:  1st Attempt  Reason for unsuccessful TCM follow-up call:  No answer/busy. Spoke with patient briefly. Request to call back Monday. Will follow. Hospital follow up scheduled 12/22/20.

## 2020-12-06 DIAGNOSIS — K922 Gastrointestinal hemorrhage, unspecified: Secondary | ICD-10-CM | POA: Diagnosis not present

## 2020-12-06 DIAGNOSIS — Z95811 Presence of heart assist device: Secondary | ICD-10-CM | POA: Diagnosis not present

## 2020-12-06 NOTE — Telephone Encounter (Signed)
Transition Care Management Follow-up Telephone Call  Date of discharge and from where: 12/02/20 from [facility]  How have you been since you were released from the hospital? No blood in stool.  Taking Metamucil. Drinking plenty of fluids. I have a sinus cold that is making me a little nausea from the drainage. Denies chest pain, abd pain, dizziness, fever, and all other symptoms.   Any questions or concerns? No  Items Reviewed:  Did the pt receive and understand the discharge instructions provided? Yes   Medications obtained and verified? Yes   Any new allergies since your discharge? No   Dietary orders reviewed? Yes  Do you have support at home? Yes   Home Care and Equipment/Supplies: Were home health services ordered? No  Functional Questionnaire: (I = Independent and D = Dependent) ADLs: I  Bathing/Dressing- I  Meal Prep- I  Eating- I  Maintaining continence- I  Transferring/Ambulation- I  Managing Meds- I  Follow up appointments reviewed:   PCP Hospital f/u appt confirmed? Yes  Scheduled to see Dr. Derrel Nip on 12/22/20 @ 11:00.   Are transportation arrangements needed? No   If their condition worsens, is the pt aware to call PCP or go to the Emergency Dept.? Yes  Was the patient provided with contact information for the PCP's office or ED? Yes  Was to pt encouraged to call back with questions or concerns? Yes

## 2020-12-08 ENCOUNTER — Telehealth: Payer: Self-pay | Admitting: *Deleted

## 2020-12-08 NOTE — Telephone Encounter (Signed)
   12/08/2020  Ashley [name] Apr 06, 1951 [id number]   Outreach Attempt:  Unsuccessful outreach attempt to patient for initial telephone assessment with [facility] for chronic care management services.  No Answer.  RN Care Manager left HIPAA compliant voice message with return contact information.  Plan:  RN Care Manager will attempt another outreach within the next 10 business days.  [place] Care Management  RN Care Coordinator  [phone] [name]

## 2020-12-14 ENCOUNTER — Ambulatory Visit (INDEPENDENT_AMBULATORY_CARE_PROVIDER_SITE_OTHER): Payer: Medicare Other | Admitting: Pharmacist

## 2020-12-14 DIAGNOSIS — E1142 Type 2 diabetes mellitus with diabetic polyneuropathy: Secondary | ICD-10-CM

## 2020-12-14 DIAGNOSIS — I25700 Atherosclerosis of coronary artery bypass graft(s), unspecified, with unstable angina pectoris: Secondary | ICD-10-CM

## 2020-12-14 DIAGNOSIS — M109 Gout, unspecified: Secondary | ICD-10-CM

## 2020-12-14 DIAGNOSIS — I5042 Chronic combined systolic (congestive) and diastolic (congestive) heart failure: Secondary | ICD-10-CM

## 2020-12-14 DIAGNOSIS — I70213 Atherosclerosis of native arteries of extremities with intermittent claudication, bilateral legs: Secondary | ICD-10-CM

## 2020-12-14 DIAGNOSIS — E7800 Pure hypercholesterolemia, unspecified: Secondary | ICD-10-CM

## 2020-12-14 NOTE — Chronic Care Management (AMB) (Signed)
Chronic Care Management Pharmacy Note  12/14/2020 Name:  Ashley [name] MRN:  [id number] DOB:  [birth year]  Subjective: Ashley [name] is an 70 y.o. year old female who is a primary patient of [name], [name], MD.  The CCM team was consulted for assistance with disease management and care coordination needs.    Engaged with patient by telephone for follow up visit in response to provider referral for pharmacy case management and/or care coordination services.   Consent to Services:  The patient was given information about Chronic Care Management services, agreed to services, and gave verbal consent prior to initiation of services.  Please see initial visit note for detailed documentation.   Objective:  Lab Results  Component Value Date   CREATININE 1.31 (H) 07/05/2020   CREATININE 1.77 (H) 06/21/2020   CREATININE 1.6 (A) 06/09/2020    Lab Results  Component Value Date   HGBA1C 5.9 07/05/2020       Component Value Date/Time   CHOL 155 01/01/2020 0328   CHOL 218 (H) 05/30/2013 0038   TRIG 70 01/01/2020 0328   TRIG 381 (H) 05/30/2013 0038   HDL 50 01/01/2020 0328   HDL 35 (L) 05/30/2013 0038   CHOLHDL 3.1 01/01/2020 0328   VLDL 14 01/01/2020 0328   VLDL 76 (H) 05/30/2013 0038   LDLCALC 91 01/01/2020 0328   LDLCALC 139 (H) 05/06/2018 1107   LDLCALC 107 (H) 05/30/2013 0038   LDLDIRECT 62.0 11/08/2018 1412   Lab Results  Component Value Date   TSH 0.725 09/23/2019     BP Readings from Last 3 Encounters:  07/05/20 102/70  05/07/20 140/87  01/01/20 121/73    Assessment: Review of patient past medical history, allergies, medications, health status, including review of consultants reports, laboratory and other test data, was performed as part of comprehensive evaluation and provision of chronic care management services.   SDOH:  (Social Determinants of Health) assessments and interventions performed:  SDOH Interventions   Flowsheet Row Most Recent Value  SDOH  Interventions   Financial Strain Interventions Intervention Not Indicated  Transportation Interventions Other (Comment)  [consider need for transportation resources in the future]      CCM Care Plan  Allergies  Allergen Reactions  . Vancomycin Nausea And Vomiting and Palpitations  . Hydrocodone-Acetaminophen Nausea Only    Medications Reviewed Today    Reviewed by De Hollingshead, RPH-CPP (Pharmacist) on 12/14/20 at 15  Med List Status: <None>  Medication Order Taking? Sig Documenting Provider Last Dose Status Informant  acetaminophen (TYLENOL) 325 MG tablet [id number] Yes Take 650 mg by mouth every 6 (six) hours as needed. [provider] Taking Active   albuterol (VENTOLIN HFA) 108 (90 Base) MCG/ACT inhaler [id number] Yes INHALE 2 PUFFS BY MOUTH INTO THE LUNGS EVERY 3 HOURS AS NEEDED FOR WHEEZING Ezekiel Slocumb, DO Taking Active Self           Med Note De Hollingshead   Tue Dec 14, 2020  1:23 PM)    allopurinol (ZYLOPRIM) 100 MG tablet [id number] Yes TAKE ONE TABLET BY MOUTH ONCE DAILY [name], MD Taking Active   ALPRAZolam Duanne Moron) 0.5 MG tablet [id number] Yes TAKE 1/2 TO 1 TABLET BY MOUTH AT BEDTIME AS NEEDED FOR ANXIETY [name], MD Taking Active   amiodarone (PACERONE) 200 MG tablet [id number] Yes Take 200 mg by mouth daily. [provider] Taking Active   amitriptyline (ELAVIL) 25 MG tablet [id number] Yes TAKE ONE TABLET BY MOUTH  AT BEDTIME [name], MD Taking Active         Discontinued 12/14/20 1323 (Discontinued by provider)            Med Note De Hollingshead   Tue Dec 14, 2020  1:23 PM)    atorvastatin (LIPITOR) 40 MG tablet [id number] Yes TAKE ONE TABLET BY MOUTH ONCE DAILY [name], MD Taking Active   colchicine 0.6 MG tablet [id number] No TAKE TWO TABLETS BY MOUTH AT FIRST SIGN OF GOUT FLARE FOLLOW BY 1 TABLET DAILY UNTIL GOUT FLARE RESOLVES  Patient not taking: Reported on 12/14/2020   [provider] Not  Taking Active            Med Note Darnelle Maffucci, Arville Lime   Tue Dec 14, 2020  1:24 PM)    cyanocobalamin (,VITAMIN B-12,) 1000 MCG/ML injection [id number] Yes Inject 1 mL into the muscle monthly  Patient taking differently: Inject 1,000 mcg into the muscle every 30 (thirty) days.   [name], MD Taking Active         Discontinued 12/14/20 1324 (Discontinued by provider)            Med Note Johny Drilling Sep 22, 2019  9:21 AM)    escitalopram (LEXAPRO) 10 MG tablet [id number] Yes TAKE ONE TABLET BY MOUTH ONCE DAILY [name], MD Taking Active        Patient not taking:      Discontinued 12/14/20 1325 (Completed Course)   ferrous sulfate 325 (65 FE) MG tablet [id number] Yes Take 325 mg by mouth daily with breakfast. [name], MD Taking Active Self  fluticasone (FLONASE) 50 MCG/ACT nasal spray [id number] Yes Place 1 spray into both nostrils as needed for allergies or rhinitis. [name], MD Taking Active Self  [facility] MUCUS ER 600 MG 12 hr tablet [id number] Yes Take 600 mg by mouth 2 (two) times daily. [provider] Taking Active   ipratropium-albuterol (DUONEB) 0.5-2.5 (3) MG/3ML SOLN [id number] Yes USE 1 VIAL VIA NEBULIZER EVERY 6 HOURS AS NEEDED [name], MD Taking Active            Med Note De Hollingshead   Tue Dec 14, 2020  1:25 PM)    levothyroxine (SYNTHROID) 50 MCG tablet [id number] Yes Take 1 tablet by mouth daily. [provider]  Active   lisinopril (ZESTRIL) 20 MG tablet [id number] Yes Take 1 tablet (20 mg total) by mouth daily. [name], MD Taking Active   Magnesium Oxide 400 (240 Mg) MG TABS [id number] Yes Take 1 tablet by mouth daily. [provider] Taking Active   metFORMIN (GLUCOPHAGE-XR) 500 MG 24 hr tablet [id number] Yes TAKE TWO TABLETS BY MOUTH EVERY MORNING [name], MD Taking Active   omeprazole (PRILOSEC) 40 MG capsule [id number] Yes TAKE ONE CAPSULE BY MOUTH ONCE DAILY  Patient taking differently: Take  40 mg by mouth in the morning and at bedtime.   [name], MD Taking Active            Med Note De Hollingshead   Tue Dec 14, 2020  1:28 PM)    potassium chloride (KLOR-CON) 10 MEQ tablet [id number] No Take 10 mEq by mouth 2 (two) times daily.  Patient not taking: Reported on 12/14/2020   [provider] Not Taking Active            Med Note ([place], [place]  Dec 14, 2020  1:28 PM)    torsemide (DEMADEX) 10 MG tablet [id number] No Take 10 mg by mouth daily.  Patient not taking: Reported on 12/14/2020   Isaias Cowman, MD Not Taking Active Self           Med Note ([place], [place] Dec 14, 2020  1:28 PM) Has not needed lately   traZODone (DESYREL) 50 MG tablet [id number] Yes TAKE 1/2 TO 1 TABLET BY MOUTH EVERY EVENING 1 HOUR BEFORE BEDTIME [name], MD Taking Active   TRELEGY ELLIPTA 100-62.5-25 MCG/INH AEPB [id number] Yes INHALE 1 PUFF BY MOUTH INTO THE LUNGS EVERY DAY [name], MD Taking Active   warfarin (COUMADIN) 2 MG tablet [id number] Yes Take 4 mg by mouth at bedtime. [provider] Taking Active           Patient Active Problem List   Diagnosis Date Noted  . 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 Snoring 07/05/2020  . Pressure injury of skin 01/01/2020  . Hypothermia 12/31/2019  . CKD (chronic kidney disease), stage IIIa 12/31/2019  . Pulmonary edema cardiac cause ([place]) 12/25/2019  . Hyperlipidemia   . COPD exacerbation ([place])   . Hyperkalemia   . Elevated troponin   . Acute renal failure superimposed on stage 3a chronic kidney disease ([place])   . Gouty arthritis of both feet 11/20/2019  . Mitral stenosis with insufficiency   . Hypertensive urgency   . Chronic renal failure, stage 3a ([place])   . Depression   . Acute on chronic respiratory failure with hypoxia ([place]) 09/22/2019  . Hyperglycemia 09/22/2019  . Presence of permanent cardiac pacemaker   . Anemia   . Chronic  kidney disease   . Hypertension   . Mitral valve disorder 08/25/2019  . CHF (congestive heart failure) ([place]) 07/16/2019  . Cough with hemoptysis 06/09/2019  . Insomnia 05/07/2019  . Hypokalemia 03/23/2019  . Hyperglycemia, drug-induced 03/23/2019  . COVID-19 virus not detected 03/23/2019  . Respiratory failure ([place]) 03/16/2019  . AVM (arteriovenous malformation) of small bowel, acquired   . Anemia, iron deficiency 11/10/2018  . Acute on chronic systolic CHF (congestive heart failure) ([place]) 11/06/2018  . Rectal polyp   . Benign neoplasm of cecum   . Barrett's esophagus without dysplasia   . Stomach irritation   . Chronic diastolic heart failure ([place]) 07/03/2018  . HTN (hypertension) 07/03/2018  . COPD with emphysema ([place]) 06/28/2018  . B12 deficiency anemia 06/28/2018  . Prediabetes 05/11/2018  . Personal history of colon cancer   . Benign neoplasm of descending colon   . Polyp of sigmoid colon   . Benign neoplasm of transverse colon   . Diverticulosis of large intestine without diverticulitis   . 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 Renal artery stenosis ([place]) 10/03/2016  . 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 Hospital discharge follow-up 02/09/2015  . Cough in adult 10/21/2014  . Tobacco abuse counseling 10/11/2014  . Tobacco abuse 10/11/2014  . Chronic right hip pain 08/26/2014  . COPD with acute exacerbation ([place]) 06/18/2013  . Atherosclerosis of native artery of extremity with intermittent claudication ([place]) 06/18/2013  . Preoperative evaluation to rule out surgical contraindication 06/18/2013  . CAD (coronary artery disease) 06/01/2013  . GERD (gastroesophageal reflux disease) 06/01/2013  . Hypercholesterolemia 06/01/2013  . Tubular adenoma of colon 06/01/2013  . Major depressive disorder in remission ([place]) 06/01/2013    Conditions to be  addressed/monitored: CHF, CAD, HTN, HLD, COPD, DMII and gout, hypothyroidism  Care Plan  : Medication Management  Updates made by De Hollingshead, RPH-CPP since 12/14/2020 12:00 AM    Problem: CAD, CHF, gout, mitral valve insufficiency, COPD     Long-Range Goal: Disease Progression Prevention   This Visit's Progress: On track  Recent Progress: On track  Priority: High  Note:   Current Barriers:  . Complex patient with high risk for hospital admission  Pharmacist Clinical Goal(s):  [name] Over the next 90 days, patient will achieve adherence to monitoring guidelines and medication adherence to achieve therapeutic efficacy. through collaboration with PharmD and provider.    Interventions: . 1:1 collaboration with [name], MD regarding development and update of comprehensive plan of care as evidenced by provider attestation and co-signature . Inter-disciplinary care team collaboration (see longitudinal plan of care) . Comprehensive medication review performed; medication list updated in electronic medical record  Health Maintenance: . Admitted at [facility] 1/20-1/27/22 for GIB (s/p recent admission in December for same issue); s/p 6 units PRBC; concurrent e coli UTI, tx cefdinir (completed 12/06/20).  . Reports today that she has had symptoms of a sinus infection since coming home from the hospital. Denies fever, but reports congestion leading to nausea, frequent productive cough (with rib pain). Unable to keep much food down, reports weight this morning was 140 lbs (weight at hospital discharge was 140 lbs). She reports she has been trying to remain well hydrated, but notes that she has a difficult time getting accurate home vitals since LVAD placement. Has been taking mucinex OTC.  [name] Discussed w/ PCP. Advised urgent care evaluation w/ respiratory testing given symptoms, weight loss, fatigue. Patient notes she will call around and find someone to take her to urgent care as she is too weak to drive. Advised that if she cannot find anyone to take to urgent care and if she  cannot drive to consider calling 911 for transport to ED (advised to ask to transport to [facility] as her LVAD team is there).  . PCP visit scheduled next week. Will follow for results of above evaluation to determine if needs to be changed to virtual or rescheduled.   Recent GIB: . Controlled; current treatment: pantoprazole 40 mg daily. Denies any BRBPR, tarry stools, blood on the toilet paper.  . Continue daily pantoprazole as per previous discharge instructions  HFrEF (LVAD) . Appropriately managed; current treatment: lisinopril 20 mg daily; torsemide 10 mg + potassium 10 mEq only PRN- reports she has not required torsemide or potassium since discharge as she has been losing weight, no weight gain; follows closely w/ [facility] cardiology; magnesium 400 mg daily;  o Hx Entresto, but d/c d/t hypotension . Arrythmia: amiodarone 200 mg daily  . LVAD anticoagulation; warfarin 4 mg M, WF, alternating w/ 2 mg- managed by [facility] . Recommended to continue current regimen w/ cardiology collaboration.   Hyperlipidemia, CAD (hx NSTEMI, s/p CABG x3 and mitral valve repair in 2007) MVR w/ replacement 2021 . Controlled per last lipid panel; current treatment: atorvastatin 40 mg daily . Antiplatelet regimen: none- held after recent GIB . Recommended to continue current regimen at this time  Chronic Obstructive Pulmonary Disease s/p tobacco cessation: . Controlled; current treatment: Trelegy 100/25/62.5 daily, albuterol nebulizer PRN- has been using frequently over the past several days . 0 exacerbations requiring treatment in the last 6 months  . Recommended to continue current regimen at this time , encouraged UC evaluation given increased  frequency of inhaler use  Gout: . Not at goal, last uric acid per Care Everywhere still >6. current treatment: allopurinol 100 mg daily, colchicine PRN. Has not required colchicine since our last call in December.  . Recommended to continue current regimen at this  time. Consider updated uric acid moving forward, could consider titration of allopurinol.  Anxiety/Depression w/ Insomnia: . Appropriately managed; current treatment: escitalopram 10 mg daily, amitriptyline 25 mg QPM, trazodone 50 mg QPM, PRN alprazolam 0.25 mg QPM; previously taking diphenhydramine QPM, advised to discontinue upon hospital discharge. Patient notes she has been so weak and fatigued since hospital discharge that she has had no problems sleeping.  . Encouraged to continue current regimen at this time. Encouraged UC evaluation given fatigue.   Diabetes: . Controlled; current regimen: metformin XR 1000 mg daily . Last A1c per Care Everywhere <5% . Consider discontinuation of metformin to reduce pill burden.   Hypothyroidism: . New diagnosis, uncontrolled but anticipated to be improved; current treatment: levothyroxine 25 mcg daily . Started at hospitalization. Due for repeat TSH ~ first week of March . Continue to follow. Continue current regimen at this time.   Patient Goals/Self-Care Activities . Over the next 90 days, patient will:  - take medications as prescribed check blood pressure periodically, document, and provide at future appointments weigh daily, and contact provider if weight gain of >3 lbs/day, > 5 lbs/week  Follow Up Plan: Telephone follow up appointment with care management team member scheduled for: ~ 10 weeks     Medication Assistance: None required.  Patient affirms current coverage meets needs.  Follow Up:  Patient agrees to Care Plan and Follow-up.  Plan: Telephone follow up appointment with care management team member scheduled for:  ~ 4 weeks  [name] Darnelle Maffucci, PharmD, [place], [place] Clinical Pharmacist [employer] at [employer] [phone]

## 2020-12-14 NOTE — Patient Instructions (Signed)
Visit Information  PATIENT GOALS: Goals Addressed              This Visit's Progress     Patient Stated   .  PharmD - Medication Monitoring (pt-stated)        Patient Goals/Self-Care Activities . Over the next 90 days, patient will:  - take medications as prescribed check blood pressure periodically, document, and provide at future appointments weigh daily, and contact provider if weight gain of >3 lbs/day, > 5 lbs/week       Patient verbalizes understanding of instructions provided today and agrees to view in [place].   Plan: Telephone follow up appointment with care management team member scheduled for:  ~ 4 weeks  [name] Darnelle Maffucci, PharmD, [place], [place] Clinical Pharmacist [employer] at [employer] [phone]

## 2020-12-15 ENCOUNTER — Telehealth: Payer: Self-pay | Admitting: Internal Medicine

## 2020-12-15 NOTE — Telephone Encounter (Signed)
Called and spoke to [place]. Ashley [name] states that she did not go to the urgent care because she immediately fell asleep after talking to [name]. She declined going to the urgent care as she feels better today and she wants to keep her appointment with Dr. Derrel Nip on 12/22/2020. She states that she lost about 28 pounds due to nausea and loss of appetite and that she feels better and is gaining her appetite back. Ashley [name] is agreeable to the change of visit from in person to [employer].

## 2020-12-15 NOTE — Telephone Encounter (Signed)
Returned call- LVM.

## 2020-12-15 NOTE — Telephone Encounter (Signed)
Attempted to call Dominique back. Per her documentation from today regarding anticoagulation, appears she was wanting [place] to check an INR when patient is office for her appointment next week.   Unfortunately, with patient's cold symptoms that she reported to me yesterday, she is not going to be able to have an in person visit with PCP next week.   Azzel- can you call patient and check on her? Reported severe nausea from congestion, reported 8 lbs weight loss since hospital discharge. I advised she go to Urgent Care yesterday for visit and COVID testing. Unsure if she went. But please also discuss w/ patient about needing to change Dr. Lupita Dawn visit next week to virtual.

## 2020-12-15 NOTE — Telephone Encounter (Signed)
Domique a Pharmacist at [facility] would like a call back to discuss patient  [phone]

## 2020-12-17 ENCOUNTER — Telehealth: Payer: Self-pay | Admitting: *Deleted

## 2020-12-17 NOTE — Telephone Encounter (Signed)
   12/17/2020  [name] 14-Oct-1951 [id number]    Outreach Attempt:  Unsuccessful telephone outreach to patient for initial CCM assessment with RNCM.  No answer. HIPAA compliant voice message left.  Plan:  RNCM will make another outreach attempt within the next 30 days if no return call back from patient.  Hubert Azure RN, MSN RN Care Management Coordinator [place] [phone] [email or web address]

## 2020-12-22 ENCOUNTER — Encounter: Payer: Self-pay | Admitting: Internal Medicine

## 2020-12-22 ENCOUNTER — Telehealth (INDEPENDENT_AMBULATORY_CARE_PROVIDER_SITE_OTHER): Payer: Medicare Other | Admitting: Internal Medicine

## 2020-12-22 VITALS — Ht 63.0 in | Wt 142.0 lb

## 2020-12-22 DIAGNOSIS — L089 Local infection of the skin and subcutaneous tissue, unspecified: Secondary | ICD-10-CM

## 2020-12-22 DIAGNOSIS — I342 Nonrheumatic mitral (valve) stenosis: Secondary | ICD-10-CM

## 2020-12-22 DIAGNOSIS — R0600 Dyspnea, unspecified: Secondary | ICD-10-CM | POA: Diagnosis not present

## 2020-12-22 DIAGNOSIS — Z7901 Long term (current) use of anticoagulants: Secondary | ICD-10-CM

## 2020-12-22 DIAGNOSIS — E039 Hypothyroidism, unspecified: Secondary | ICD-10-CM

## 2020-12-22 DIAGNOSIS — T829XXS Unspecified complication of cardiac and vascular prosthetic device, implant and graft, sequela: Secondary | ICD-10-CM

## 2020-12-22 DIAGNOSIS — Z95811 Presence of heart assist device: Secondary | ICD-10-CM | POA: Diagnosis not present

## 2020-12-22 DIAGNOSIS — D500 Iron deficiency anemia secondary to blood loss (chronic): Secondary | ICD-10-CM

## 2020-12-22 DIAGNOSIS — R634 Abnormal weight loss: Secondary | ICD-10-CM

## 2020-12-22 DIAGNOSIS — I5043 Acute on chronic combined systolic (congestive) and diastolic (congestive) heart failure: Secondary | ICD-10-CM

## 2020-12-22 DIAGNOSIS — Z20822 Contact with and (suspected) exposure to covid-19: Secondary | ICD-10-CM | POA: Diagnosis not present

## 2020-12-22 DIAGNOSIS — R0609 Other forms of dyspnea: Secondary | ICD-10-CM

## 2020-12-22 DIAGNOSIS — Z5181 Encounter for therapeutic drug level monitoring: Secondary | ICD-10-CM | POA: Diagnosis not present

## 2020-12-22 DIAGNOSIS — S21101A Unspecified open wound of right front wall of thorax without penetration into thoracic cavity, initial encounter: Secondary | ICD-10-CM | POA: Diagnosis not present

## 2020-12-22 DIAGNOSIS — I059 Rheumatic mitral valve disease, unspecified: Secondary | ICD-10-CM

## 2020-12-22 DIAGNOSIS — I340 Nonrheumatic mitral (valve) insufficiency: Secondary | ICD-10-CM

## 2020-12-22 DIAGNOSIS — D62 Acute posthemorrhagic anemia: Secondary | ICD-10-CM

## 2020-12-22 NOTE — Patient Instructions (Signed)
CONTINUE CURRENT MEDICATIONS   RESUME A DOSE OF LASIX AND POTASSIUM FOR WEIGHT GAIN OF 2 LBS OVERNIGHT OR 5 LBS OVER A WEEK  LABS AND CHEST X RAY Friday MORNING

## 2020-12-22 NOTE — Progress Notes (Signed)
Telephone Note  This visit type was conducted due to national recommendations for restrictions regarding the COVID-19 pandemic (e.g. social distancing).  This format is felt to be most appropriate for this patient at this time.  All issues noted in this document were discussed and addressed.  No physical exam was performed (except for noted visual exam findings with Video Visits).   I connected with@ on 12/22/20 at 11:00 AM EST by  telephone and verified that I am speaking with the correct person using two identifiers. Location patient: home Location provider: work or home office Persons participating in the virtual visit: patient, provider  I discussed the limitations, risks, security and privacy concerns of performing an evaluation and management service by telephone and the availability of in person appointments. I also discussed with the patient that there may be a patient responsible charge related to this service. The patient expressed understanding and agreed to proceed.  Reason for visit: hospital follow up   HPI: 70 yr old female with ischemic cardiomyopathy s/p LVAD complicated by sternal wound infection with dehiscence, multiple admissions  Over the last 6 months  Admitted to [facility] on Jan 20 with hematochezia, profound anemia and E coli UTI .    [name] Admitting hgb was  5.5  And INR was  5.1    She received a total of 6 units of PRBCS and underwent diagnostic colonoscopy on jan 24 noted the following:  - Blood in the entire examined colon. - A single actively bleeding colonic angioectasia. Clip (MR conditional) was placed. - One 5 mm polyp in the ascending colon. - The examined portion of the ileum was normal without blood  She was discharged to home on Jan 27 on 3 mg warfarin with goal INR 1.5 to 2.5   . E coli.[name]  UTI was treated during hospitalization   Chest wound (sternal wound dehiscence) has received wound vac via [facility] followed by  [place] , wound healed now.   She  states that Has been ill for 3 weeks. Has lost 28 lbs due to anorexia.  .  Did not get tested for COVID  Pulse ox 97%  ,  Still having DOE   And 2 pillow orthopnea , doesn't feel like she is having fluid retention   Has gained 2 lb over the last 2 days . Not taking daily lasix  or potassium unless she gains 5 lbs overnight (per her recollection of CHF home  instructions)  hecking INR from home     ROS: See pertinent positives and negatives per HPI.  Past Medical History:  Diagnosis Date  . AICD (automatic cardioverter/defibrillator) present    on right side  . Anemia   . CAD (coronary artery disease)    s/p CABG  . CHF (congestive heart failure) ([place])   . Chronic kidney disease    renal artery stenosis  . Colon cancer ([place])   . Depression   . GERD (gastroesophageal reflux disease)   . Hx of colonic polyps   . Hyperlipidemia   . Hypertension   . Mitral valve disorder    s/p mitral valve repair wth CABG  . Myocardial infarction ([place])   . Peripheral vascular disease ([place])   . Presence of permanent cardiac pacemaker    Pacemaker/ Defibrillator  . Tobacco abuse 10/11/2014    Past Surgical History:  Procedure Laterality Date  . arm surgery     fracture, has plates and screws  . CABG with mitral valve repair    .  CARDIAC CATHETERIZATION    . COLON SURGERY     colon cancer  . COLONOSCOPY WITH PROPOFOL N/A 10/09/2016   Procedure: COLONOSCOPY WITH PROPOFOL;  Surgeon: Jonathon Bellows, MD;  Location: [facility] ENDOSCOPY;  Service: Endoscopy;  Laterality: N/A;  . COLONOSCOPY WITH PROPOFOL N/A 07/24/2018   Procedure: COLONOSCOPY WITH PROPOFOL;  Surgeon: Virgel Manifold, MD;  Location: [facility] ENDOSCOPY;  Service: Endoscopy;  Laterality: N/A;  . CORONARY ARTERY BYPASS GRAFT    . ENTEROSCOPY N/A 11/21/2018   Procedure: ENTEROSCOPY-BALLOON;  Surgeon: Jonathon Bellows, MD;  Location: [facility] ENDOSCOPY;  Service: Gastroenterology;  Laterality: N/A;  . ESOPHAGOGASTRODUODENOSCOPY (EGD) WITH PROPOFOL N/A  07/24/2018   Procedure: ESOPHAGOGASTRODUODENOSCOPY (EGD) WITH PROPOFOL;  Surgeon: Virgel Manifold, MD;  Location: [facility] ENDOSCOPY;  Service: Endoscopy;  Laterality: N/A;  . GIVENS CAPSULE STUDY N/A 08/16/2018   Procedure: GIVENS CAPSULE STUDY;  Surgeon: Virgel Manifold, MD;  Location: [facility] ENDOSCOPY;  Service: Endoscopy;  Laterality: N/A;  . pace maker defib  2016  . PERIPHERAL VASCULAR CATHETERIZATION N/A 10/23/2016   Procedure: Renal Angiography;  Surgeon: Algernon Huxley, MD;  Location: [place] CV LAB;  Service: Cardiovascular;  Laterality: N/A;  . RIGHT/LEFT HEART CATH AND CORONARY ANGIOGRAPHY N/A 08/26/2019   Procedure: RIGHT/LEFT HEART CATH AND CORONARY ANGIOGRAPHY;  Surgeon: Isaias Cowman, MD;  Location: [place] CV LAB;  Service: Cardiovascular;  Laterality: N/A;  . TOTAL ABDOMINAL HYSTERECTOMY  1999   history of abnormal pap    Family History  Problem Relation Age of Onset  . Arthritis Mother   . Cancer Mother        uterus cancer  . Hyperlipidemia Mother   . Hypertension Mother   . Heart disease Mother   . Diabetes Mother   . Hyperlipidemia Father   . Hypertension Father   . Heart disease Father   . Diabetes Father   . Cancer Sister        ovary cancer  . Diabetes Maternal Grandmother   . Hypertension Maternal Grandmother   . Arthritis Maternal Grandmother   . Hypertension Maternal Grandfather   . Hypertension Paternal Grandmother   . Hypertension Paternal Grandfather   . Heart disease Paternal Grandfather   . Heart disease Brother   . Kidney disease Brother   . Breast cancer Neg Hx     SOCIAL HX:  reports that she quit smoking about 18 months ago. Her smoking use included cigarettes. She smoked 0.25 packs per day. She has never used smokeless tobacco. She reports current alcohol use of about 2.0 standard drinks of alcohol per week. She reports that she does not use drugs.   Current Outpatient Medications:  .  acetaminophen (TYLENOL) 325 MG  tablet, Take 650 mg by mouth every 6 (six) hours as needed., Disp: , Rfl:  .  albuterol (VENTOLIN HFA) 108 (90 Base) MCG/ACT inhaler, INHALE 2 PUFFS BY MOUTH INTO THE LUNGS EVERY 3 HOURS AS NEEDED FOR WHEEZING, Disp: 54 g, Rfl: 2 .  allopurinol (ZYLOPRIM) 100 MG tablet, TAKE ONE TABLET BY MOUTH ONCE DAILY, Disp: 30 tablet, Rfl: 5 .  ALPRAZolam (XANAX) 0.5 MG tablet, TAKE 1/2 TO 1 TABLET BY MOUTH AT BEDTIME AS NEEDED FOR ANXIETY, Disp: 30 tablet, Rfl: 3 .  amitriptyline (ELAVIL) 25 MG tablet, TAKE ONE TABLET BY MOUTH AT BEDTIME, Disp: 90 tablet, Rfl: 1 .  atorvastatin (LIPITOR) 40 MG tablet, TAKE ONE TABLET BY MOUTH ONCE DAILY, Disp: 90 tablet, Rfl: 1 .  cyanocobalamin (,VITAMIN B-12,) 1000 MCG/ML injection, Inject  1 mL into the muscle monthly (Patient taking differently: Inject 1,000 mcg into the muscle every 30 (thirty) days.), Disp: 10 mL, Rfl: 0 .  escitalopram (LEXAPRO) 10 MG tablet, TAKE ONE TABLET BY MOUTH ONCE DAILY, Disp: 90 tablet, Rfl: 1 .  ferrous sulfate 325 (65 FE) MG tablet, Take 325 mg by mouth daily with breakfast., Disp: , Rfl:  .  fluticasone (FLONASE) 50 MCG/ACT nasal spray, Place 1 spray into both nostrils as needed for allergies or rhinitis., Disp: , Rfl:  .  GNP MUCUS ER 600 MG 12 hr tablet, Take 600 mg by mouth 2 (two) times daily., Disp: , Rfl:  .  ipratropium-albuterol (DUONEB) 0.5-2.5 (3) MG/3ML SOLN, USE 1 VIAL VIA NEBULIZER EVERY 6 HOURS AS NEEDED, Disp: 360 mL, Rfl: 1 .  levothyroxine (SYNTHROID) 50 MCG tablet, Take 1 tablet by mouth daily., Disp: , Rfl:  .  lisinopril (ZESTRIL) 20 MG tablet, Take 1 tablet (20 mg total) by mouth daily., Disp: 90 tablet, Rfl: 1 .  magnesium hydroxide (MILK OF MAGNESIA) 400 MG/5ML suspension, Take by mouth., Disp: , Rfl:  .  magnesium oxide (MAG-OX) 400 (241.3 Mg) MG tablet, Take 1 tablet by mouth daily., Disp: , Rfl:  .  Magnesium Oxide 400 (240 Mg) MG TABS, Take 1 tablet by mouth daily., Disp: , Rfl:  .  metFORMIN (GLUCOPHAGE-XR) 500 MG  24 hr tablet, TAKE TWO TABLETS BY MOUTH EVERY MORNING, Disp: 180 tablet, Rfl: 1 .  omeprazole (PRILOSEC) 40 MG capsule, TAKE ONE CAPSULE BY MOUTH ONCE DAILY (Patient taking differently: Take 40 mg by mouth in the morning and at bedtime.), Disp: 90 capsule, Rfl: 1 .  senna-docusate (SENOKOT-S) 8.6-50 MG tablet, Take by mouth., Disp: , Rfl:  .  traZODone (DESYREL) 50 MG tablet, TAKE 1/2 TO 1 TABLET BY MOUTH EVERY EVENING 1 HOUR BEFORE BEDTIME, Disp: 90 tablet, Rfl: 3 .  TRELEGY ELLIPTA 100-62.5-25 MCG/INH AEPB, INHALE 1 PUFF BY MOUTH INTO THE LUNGS EVERY DAY, Disp: 60 each, Rfl: 2 .  warfarin (COUMADIN) 2 MG tablet, Take 4 mg by mouth at bedtime. Monday, Wednesday and Friday 3 mg and AOD takes 4 mg., Disp: , Rfl:  .  amiodarone (PACERONE) 200 MG tablet, Take 200 mg by mouth daily. (Patient not taking: Reported on 12/22/2020), Disp: , Rfl:  .  amLODipine (NORVASC) 2.5 MG tablet, Take 2.5 mg by mouth daily., Disp: , Rfl:  .  colchicine 0.6 MG tablet, TAKE TWO TABLETS BY MOUTH AT FIRST SIGN OF GOUT FLARE FOLLOW BY 1 TABLET DAILY UNTIL GOUT FLARE RESOLVES (Patient not taking: No sig reported), Disp: , Rfl:  .  ENTRESTO 49-51 MG, Take 1 tablet by mouth 2 (two) times daily., Disp: , Rfl:  .  folic acid (FOLVITE) 1 MG tablet, Take 1 mg by mouth daily., Disp: , Rfl:  .  potassium chloride (KLOR-CON) 10 MEQ tablet, Take 10 mEq by mouth 2 (two) times daily. (Patient not taking: No sig reported), Disp: , Rfl:  .  torsemide (DEMADEX) 10 MG tablet, Take 10 mg by mouth daily. (Patient not taking: No sig reported), Disp: , Rfl:   EXAM:   General impression: alert, cooperative and articulate.  No signs of being in distress  Lungs: speech is fluent sentence length suggests that patient is not short of breath and not punctuated by cough, sneezing or sniffing. [name]   Psych: affect normal.  speech is articulate and non pressured .  Denies suicidal thoughts    ASSESSMENT AND PLAN:  Discussed the following assessment  and plan:  Weight loss, unintentional - Plan: Protime-INR, Comprehensive metabolic panel, Magnesium  Acute blood loss anemia - Plan: CBC with Differential/Platelet  Suspected 2019 novel coronavirus infection - Plan: SARS-CoV-2 Semi-Quantitative Total Antibody, Spike  DOE (dyspnea on exertion) - Plan: DG Chest 2 View  Sternal wound infection  Mitral valve disorder  Nonrheumatic mitral valve stenosis with insufficiency  Anticoagulation goal of INR 1.5 to 2.5  LVAD (left ventricular assist device) present ([place])  Complication involving left ventricular assist device (LVAD), sequela  Suspected COVID-19 virus infection  Acquired hypothyroidism  Acute on chronic combined systolic and diastolic congestive heart failure (HCC)  Acute blood loss anemia With drop in hgb to 5.5 on jan 20 resulting in admission to [facility] for stabilization.  She underwent transfusion of a total of 6 units.  INR was 4.1 and reversed.  Colonoscopy noted a single angioectasia .  hgb at discharge was. 8.6 on jan 2 and rose to 9.9 on jan 31 check will recheck this Friday.   Anticoagulation goal of INR 1.5 to 2.5 INR was 1.6 on Feb 9 per [facility] records.  Using warfarin 3 mg daily alternating with 4 mg .  Managed by [facility]   LVAD (left ventricular assist device) present [facility]) Placed in October for severe cardiomyopathy,  EF 20% .  Complication involving left ventricular assist device (LVAD) Sternal wound infection with dehiscence ,   Treated by [facility] , now healed post wound VAc   Suspected COVID-19 virus infection Patient reports a 3 week URI today ,  Starting right after discharge , with a 25 lb weight loss due to anorexia   Hypothyroidism Noted during recent admission.  Now taking levothyroxine  50 mcg daily since jan 28   CHF (congestive heart failure) ([place]) Corrected patient's impression of [facility]F care.  Advised patient to continue to monitor wt daily and resume  lasix dose for overnight weight gain of 2 lbs  or weekly weight gain of 5 lbs.     I discussed the assessment and treatment plan with the patient. The patient was provided an opportunity to ask questions and all were answered. The patient agreed with the plan and demonstrated an understanding of the instructions.   The patient was advised to call back or seek an in-person evaluation if the symptoms worsen or if the condition fails to improve as anticipated.   I spent 30 minutes dedicated to the care of this patient on the date of this encounter to include pre-visit review of his medical history,  Face-to-face time with the patient , and post visit ordering of testing and therapeutics.    [name], MD

## 2020-12-23 DIAGNOSIS — Z7901 Long term (current) use of anticoagulants: Secondary | ICD-10-CM | POA: Insufficient documentation

## 2020-12-23 DIAGNOSIS — Z5181 Encounter for therapeutic drug level monitoring: Secondary | ICD-10-CM | POA: Insufficient documentation

## 2020-12-23 DIAGNOSIS — T829XXA Unspecified complication of cardiac and vascular prosthetic device, implant and graft, initial encounter: Secondary | ICD-10-CM | POA: Insufficient documentation

## 2020-12-23 DIAGNOSIS — S21101A Unspecified open wound of right front wall of thorax without penetration into thoracic cavity, initial encounter: Secondary | ICD-10-CM | POA: Insufficient documentation

## 2020-12-23 DIAGNOSIS — L089 Local infection of the skin and subcutaneous tissue, unspecified: Secondary | ICD-10-CM | POA: Insufficient documentation

## 2020-12-23 DIAGNOSIS — Z95811 Presence of heart assist device: Secondary | ICD-10-CM | POA: Insufficient documentation

## 2020-12-23 DIAGNOSIS — E039 Hypothyroidism, unspecified: Secondary | ICD-10-CM | POA: Insufficient documentation

## 2020-12-23 NOTE — Assessment & Plan Note (Signed)
With drop in hgb to 5.5 on jan 20 resulting in admission to [facility] for stabilization.  She underwent transfusion of a total of 6 units.  INR was 4.1 and reversed.  Colonoscopy noted a single angioectasia .  hgb at discharge was. 8.6 on jan 2 and rose to 9.9 on jan 31 check will recheck this Friday.

## 2020-12-23 NOTE — Assessment & Plan Note (Signed)
Placed in October for severe cardiomyopathy,  EF 20% .

## 2020-12-23 NOTE — Assessment & Plan Note (Signed)
Patient reports a 3 week URI today ,  Starting right after discharge , with a 25 lb weight loss due to anorexia

## 2020-12-23 NOTE — Assessment & Plan Note (Addendum)
Noted during recent admission.  Now taking levothyroxine  50 mcg daily since jan 28

## 2020-12-23 NOTE — Assessment & Plan Note (Signed)
Sternal wound infection with dehiscence ,   Treated by [facility] , now healed post wound VAc

## 2020-12-23 NOTE — Assessment & Plan Note (Addendum)
INR was 1.6 on Feb 9 per [facility] records.  Using warfarin 3 mg daily alternating with 4 mg .  Managed by [facility]

## 2020-12-23 NOTE — Assessment & Plan Note (Signed)
Corrected patient's impression of [facility] care.  Advised patient to continue to monitor wt daily and resume  lasix dose for overnight weight gain of 2 lbs or weekly weight gain of 5 lbs.

## 2020-12-24 ENCOUNTER — Ambulatory Visit (INDEPENDENT_AMBULATORY_CARE_PROVIDER_SITE_OTHER): Payer: Medicare Other

## 2020-12-24 ENCOUNTER — Other Ambulatory Visit: Payer: Self-pay

## 2020-12-24 ENCOUNTER — Other Ambulatory Visit (INDEPENDENT_AMBULATORY_CARE_PROVIDER_SITE_OTHER): Payer: Medicare Other

## 2020-12-24 DIAGNOSIS — D62 Acute posthemorrhagic anemia: Secondary | ICD-10-CM | POA: Diagnosis not present

## 2020-12-24 DIAGNOSIS — J811 Chronic pulmonary edema: Secondary | ICD-10-CM | POA: Diagnosis not present

## 2020-12-24 DIAGNOSIS — R000 Tachycardia, unspecified: Secondary | ICD-10-CM | POA: Diagnosis not present

## 2020-12-24 DIAGNOSIS — I509 Heart failure, unspecified: Secondary | ICD-10-CM | POA: Diagnosis not present

## 2020-12-24 DIAGNOSIS — N183 Chronic kidney disease, stage 3 unspecified: Secondary | ICD-10-CM | POA: Diagnosis not present

## 2020-12-24 DIAGNOSIS — E785 Hyperlipidemia, unspecified: Secondary | ICD-10-CM | POA: Diagnosis not present

## 2020-12-24 DIAGNOSIS — R0602 Shortness of breath: Secondary | ICD-10-CM | POA: Diagnosis not present

## 2020-12-24 DIAGNOSIS — Z20822 Contact with and (suspected) exposure to covid-19: Secondary | ICD-10-CM | POA: Diagnosis not present

## 2020-12-24 DIAGNOSIS — R0600 Dyspnea, unspecified: Secondary | ICD-10-CM

## 2020-12-24 DIAGNOSIS — I110 Hypertensive heart disease with heart failure: Secondary | ICD-10-CM | POA: Diagnosis not present

## 2020-12-24 DIAGNOSIS — E1140 Type 2 diabetes mellitus with diabetic neuropathy, unspecified: Secondary | ICD-10-CM | POA: Diagnosis not present

## 2020-12-24 DIAGNOSIS — Z48812 Encounter for surgical aftercare following surgery on the circulatory system: Secondary | ICD-10-CM | POA: Diagnosis not present

## 2020-12-24 DIAGNOSIS — K219 Gastro-esophageal reflux disease without esophagitis: Secondary | ICD-10-CM | POA: Diagnosis not present

## 2020-12-24 DIAGNOSIS — I130 Hypertensive heart and chronic kidney disease with heart failure and stage 1 through stage 4 chronic kidney disease, or unspecified chronic kidney disease: Secondary | ICD-10-CM | POA: Diagnosis not present

## 2020-12-24 DIAGNOSIS — I4891 Unspecified atrial fibrillation: Secondary | ICD-10-CM | POA: Diagnosis not present

## 2020-12-24 DIAGNOSIS — I517 Cardiomegaly: Secondary | ICD-10-CM | POA: Diagnosis not present

## 2020-12-24 DIAGNOSIS — N390 Urinary tract infection, site not specified: Secondary | ICD-10-CM | POA: Diagnosis not present

## 2020-12-24 DIAGNOSIS — I252 Old myocardial infarction: Secondary | ICD-10-CM | POA: Diagnosis not present

## 2020-12-24 DIAGNOSIS — E871 Hypo-osmolality and hyponatremia: Secondary | ICD-10-CM | POA: Diagnosis not present

## 2020-12-24 DIAGNOSIS — I428 Other cardiomyopathies: Secondary | ICD-10-CM | POA: Diagnosis not present

## 2020-12-24 DIAGNOSIS — I5020 Unspecified systolic (congestive) heart failure: Secondary | ICD-10-CM | POA: Diagnosis not present

## 2020-12-24 DIAGNOSIS — E1151 Type 2 diabetes mellitus with diabetic peripheral angiopathy without gangrene: Secondary | ICD-10-CM | POA: Diagnosis not present

## 2020-12-24 DIAGNOSIS — E039 Hypothyroidism, unspecified: Secondary | ICD-10-CM | POA: Diagnosis not present

## 2020-12-24 DIAGNOSIS — I255 Ischemic cardiomyopathy: Secondary | ICD-10-CM | POA: Diagnosis not present

## 2020-12-24 DIAGNOSIS — R768 Other specified abnormal immunological findings in serum: Secondary | ICD-10-CM | POA: Diagnosis not present

## 2020-12-24 DIAGNOSIS — R791 Abnormal coagulation profile: Secondary | ICD-10-CM | POA: Diagnosis not present

## 2020-12-24 DIAGNOSIS — J849 Interstitial pulmonary disease, unspecified: Secondary | ICD-10-CM | POA: Diagnosis not present

## 2020-12-24 DIAGNOSIS — I2510 Atherosclerotic heart disease of native coronary artery without angina pectoris: Secondary | ICD-10-CM | POA: Diagnosis not present

## 2020-12-24 DIAGNOSIS — Z87891 Personal history of nicotine dependence: Secondary | ICD-10-CM | POA: Diagnosis not present

## 2020-12-24 DIAGNOSIS — E876 Hypokalemia: Secondary | ICD-10-CM | POA: Diagnosis not present

## 2020-12-24 DIAGNOSIS — J90 Pleural effusion, not elsewhere classified: Secondary | ICD-10-CM | POA: Diagnosis not present

## 2020-12-24 DIAGNOSIS — I5022 Chronic systolic (congestive) heart failure: Secondary | ICD-10-CM | POA: Diagnosis not present

## 2020-12-24 DIAGNOSIS — Z95811 Presence of heart assist device: Secondary | ICD-10-CM | POA: Diagnosis not present

## 2020-12-24 DIAGNOSIS — J441 Chronic obstructive pulmonary disease with (acute) exacerbation: Secondary | ICD-10-CM | POA: Diagnosis not present

## 2020-12-24 DIAGNOSIS — R0609 Other forms of dyspnea: Secondary | ICD-10-CM

## 2020-12-24 DIAGNOSIS — D72828 Other elevated white blood cell count: Secondary | ICD-10-CM | POA: Diagnosis not present

## 2020-12-24 DIAGNOSIS — R634 Abnormal weight loss: Secondary | ICD-10-CM | POA: Diagnosis not present

## 2020-12-24 DIAGNOSIS — R7401 Elevation of levels of liver transaminase levels: Secondary | ICD-10-CM

## 2020-12-24 DIAGNOSIS — I5023 Acute on chronic systolic (congestive) heart failure: Secondary | ICD-10-CM | POA: Diagnosis not present

## 2020-12-24 DIAGNOSIS — I272 Pulmonary hypertension, unspecified: Secondary | ICD-10-CM | POA: Diagnosis not present

## 2020-12-24 DIAGNOSIS — J8489 Other specified interstitial pulmonary diseases: Secondary | ICD-10-CM | POA: Diagnosis not present

## 2020-12-24 DIAGNOSIS — T501X5A Adverse effect of loop [high-ceiling] diuretics, initial encounter: Secondary | ICD-10-CM | POA: Diagnosis not present

## 2020-12-24 DIAGNOSIS — Z4801 Encounter for change or removal of surgical wound dressing: Secondary | ICD-10-CM | POA: Diagnosis not present

## 2020-12-24 DIAGNOSIS — D6489 Other specified anemias: Secondary | ICD-10-CM | POA: Diagnosis not present

## 2020-12-24 DIAGNOSIS — T502X5A Adverse effect of carbonic-anhydrase inhibitors, benzothiadiazides and other diuretics, initial encounter: Secondary | ICD-10-CM | POA: Diagnosis not present

## 2020-12-24 DIAGNOSIS — E1122 Type 2 diabetes mellitus with diabetic chronic kidney disease: Secondary | ICD-10-CM | POA: Diagnosis not present

## 2020-12-24 DIAGNOSIS — I5089 Other heart failure: Secondary | ICD-10-CM | POA: Diagnosis not present

## 2020-12-24 LAB — COMPREHENSIVE METABOLIC PANEL
ALT: 122 U/L — ABNORMAL HIGH (ref 0–35)
AST: 152 U/L — ABNORMAL HIGH (ref 0–37)
Albumin: 3.1 g/dL — ABNORMAL LOW (ref 3.5–5.2)
Alkaline Phosphatase: 111 U/L (ref 39–117)
BUN: 15 mg/dL (ref 6–23)
CO2: 26 mEq/L (ref 19–32)
Calcium: 8.4 mg/dL (ref 8.4–10.5)
Chloride: 98 mEq/L (ref 96–112)
Creatinine, Ser: 1 mg/dL (ref 0.40–1.20)
GFR: 58.23 mL/min — ABNORMAL LOW (ref >60.00)
Glucose, Bld: 159 mg/dL — ABNORMAL HIGH (ref 70–99)
Potassium: 3.5 mEq/L (ref 3.5–5.1)
Sodium: 133 mEq/L — ABNORMAL LOW (ref 135–145)
Total Bilirubin: 1.1 mg/dL (ref 0.2–1.2)
Total Protein: 6.2 g/dL (ref 6.0–8.3)

## 2020-12-24 LAB — CBC WITH DIFFERENTIAL/PLATELET
Basophils Absolute: 0.2 10*3/uL — ABNORMAL HIGH (ref 0.0–0.1)
Basophils Relative: 2 % (ref 0.0–3.0)
Eosinophils Absolute: 0.2 10*3/uL (ref 0.0–0.7)
Eosinophils Relative: 2 % (ref 0.0–5.0)
HCT: 24.3 % — ABNORMAL LOW (ref 36.0–46.0)
Hemoglobin: 8.1 g/dL — ABNORMAL LOW (ref 12.0–15.0)
Lymphocytes Relative: 11.6 % — ABNORMAL LOW (ref 12.0–46.0)
Lymphs Abs: 1.2 10*3/uL (ref 0.7–4.0)
MCHC: 33.5 g/dL (ref 30.0–36.0)
MCV: 82.7 fl (ref 78.0–100.0)
Monocytes Absolute: 0.9 10*3/uL (ref 0.1–1.0)
Monocytes Relative: 9.2 % (ref 3.0–12.0)
Neutro Abs: 7.5 10*3/uL (ref 1.4–7.7)
Neutrophils Relative %: 75.2 % (ref 43.0–77.0)
Platelets: 383 10*3/uL (ref 150.0–400.0)
RBC: 2.94 Mil/uL — ABNORMAL LOW (ref 3.87–5.11)
RDW: 18.1 % — ABNORMAL HIGH (ref 11.5–15.5)
WBC: 10 10*3/uL (ref 4.0–10.5)

## 2020-12-24 LAB — PROTIME-INR
INR: 3.2 ratio — ABNORMAL HIGH (ref 0.8–1.0)
Prothrombin Time: 35.4 s — ABNORMAL HIGH (ref 9.6–13.1)

## 2020-12-24 LAB — MAGNESIUM: Magnesium: 1.8 mg/dL (ref 1.5–2.5)

## 2020-12-24 NOTE — Addendum Note (Signed)
Addended by: [name] on: 12/24/2020 05:14 PM   Modules accepted: Orders

## 2020-12-26 DIAGNOSIS — R768 Other specified abnormal immunological findings in serum: Secondary | ICD-10-CM | POA: Diagnosis not present

## 2020-12-27 DIAGNOSIS — R768 Other specified abnormal immunological findings in serum: Secondary | ICD-10-CM | POA: Insufficient documentation

## 2020-12-29 ENCOUNTER — Telehealth: Payer: Medicare Other

## 2020-12-29 ENCOUNTER — Telehealth: Payer: Self-pay | Admitting: *Deleted

## 2020-12-29 LAB — SARS-COV-2 SEMI-QUANTITATIVE TOTAL ANTIBODY, SPIKE: SARS COV2 AB, Total Spike Semi QN: >2500 U/mL — ABNORMAL HIGH (ref <0.8)

## 2020-12-29 NOTE — Telephone Encounter (Signed)
   12/29/2020  Ashley [name] [birth year] [id number]    Outreach Attempt:  Patient hospitalized at [facility] for shortness of breath and urinary tract infection.  Outreach scheduled for today, cancelled.    Plan:  RNCM will monitor chart for patients discharge to outreach once home.  Hubert Azure RN, MSN RN Care Management Coordinator [place] [phone] [email or web address]

## 2020-12-30 DIAGNOSIS — I428 Other cardiomyopathies: Secondary | ICD-10-CM | POA: Diagnosis not present

## 2020-12-30 DIAGNOSIS — Z48812 Encounter for surgical aftercare following surgery on the circulatory system: Secondary | ICD-10-CM | POA: Diagnosis not present

## 2020-12-30 DIAGNOSIS — Z95811 Presence of heart assist device: Secondary | ICD-10-CM | POA: Diagnosis not present

## 2020-12-30 DIAGNOSIS — Z4801 Encounter for change or removal of surgical wound dressing: Secondary | ICD-10-CM | POA: Diagnosis not present

## 2020-12-31 ENCOUNTER — Telehealth: Payer: Self-pay

## 2020-12-31 NOTE — Telephone Encounter (Signed)
Transition Care Management not scheduled at this time.   Date of discharge and from where:  12/30/20 from [place]  Patient notes she is following up with [facility] for transition of care and feels she is at her baseline. Nurse encourages patient to call pcp as needed.

## 2021-01-03 ENCOUNTER — Ambulatory Visit: Payer: Medicare Other | Admitting: *Deleted

## 2021-01-03 DIAGNOSIS — E1142 Type 2 diabetes mellitus with diabetic polyneuropathy: Secondary | ICD-10-CM | POA: Diagnosis not present

## 2021-01-03 DIAGNOSIS — I5032 Chronic diastolic (congestive) heart failure: Secondary | ICD-10-CM | POA: Diagnosis not present

## 2021-01-03 DIAGNOSIS — I5042 Chronic combined systolic (congestive) and diastolic (congestive) heart failure: Secondary | ICD-10-CM

## 2021-01-03 DIAGNOSIS — E7800 Pure hypercholesterolemia, unspecified: Secondary | ICD-10-CM | POA: Diagnosis not present

## 2021-01-03 DIAGNOSIS — D62 Acute posthemorrhagic anemia: Secondary | ICD-10-CM

## 2021-01-03 DIAGNOSIS — J439 Emphysema, unspecified: Secondary | ICD-10-CM

## 2021-01-03 DIAGNOSIS — I25700 Atherosclerosis of coronary artery bypass graft(s), unspecified, with unstable angina pectoris: Secondary | ICD-10-CM

## 2021-01-03 DIAGNOSIS — K5520 Angiodysplasia of colon without hemorrhage: Secondary | ICD-10-CM

## 2021-01-03 NOTE — Patient Instructions (Addendum)
Visit Information  PATIENT GOALS: Goals Addressed            This Visit's Progress   . [facility]) Track and Manage Fluids and Swelling       Timeframe:  Long-Range Goal Priority:  High Start Date:  01/03/21                           Expected End Date:   07/05/21                    Follow Up Date 01/17/2021    . Call office if I gain more than 2 pounds in one day or 5 pounds in one week . Use salt in moderation . Weigh myself daily and record in log along with LVAD daily parameters for provider review . Monitor lower extremities and abdomen for swelling . Take medications as prescribed  Why is this important?   It is important to check your weight daily and watch how much salt and liquids you have.  It will help you to manage your heart failure.    Notes:    [name] (RNCM) Track and Manage My Symptoms-COPD       Timeframe:  Long-Range Goal Priority:  High Start Date:  01/03/21                           Expected End Date:   07/05/21                    Follow Up Date 01/17/21    . Develop a rescue plan . Eliminate symptom triggers at home . Follow rescue plan if symptoms flare-up . Keep follow-up appointments  . Obtain CO2 Detectors for home . Use Incentive Spirometer and coughing and deep breathing exercises   Why is this important?    Tracking your symptoms and other information about your health helps your doctor plan your care.   Write down the symptoms, the time of day, what you were doing and what medicine you are taking.   You will soon learn how to manage your symptoms.     Notes:    [name] (RNCM) Track My Symptoms-GI Bleed       Timeframe:  Long-Range Goal Priority:  High Start Date: 01/03/21                            Expected End Date: 07/05/21                      Follow Up Date 01/17/21   . Monitor yourself for low hemoglobin (feeling tired, exhausted, pale, shortness of breath) . Track what makes symptoms worse and what makes them better  . Contact provider for  symptoms right away . Pay attention to LVAD alerts . Monitor stools for blood . Attend appointments, especially INR checks   Why is this important?    Keeping track of symptoms that you have helps you to understand your condition. You will also learn what works to manage it.   You and your doctor will then be able to come up with the best treatment plan for you.    Notes:     Patient verbalizes understanding of instructions provided today and agrees to view in [place].   The care management team will reach out to the patient again over  the next 20 business days.   Hubert Azure RN, MSN RN Care Management Coordinator [place] [phone] [email or web address]   [employer] medicine (25th ed., pp. [phone]). [place], [place]: Elsevier.">  Anemia  Anemia is a condition in which there is not enough red blood cells or hemoglobin in the blood. Hemoglobin is a substance in red blood cells that carries oxygen. When you do not have enough red blood cells or hemoglobin (are anemic), your body cannot get enough oxygen and your organs may not work properly. As a result, you may feel very tired or have other problems. What are the causes? Common causes of anemia include:  Excessive bleeding. Anemia can be caused by excessive bleeding inside or outside the body, including bleeding from the intestines or from heavy menstrual periods in females.  Poor nutrition.  Long-lasting (chronic) kidney, thyroid, and liver disease.  Bone marrow disorders, spleen problems, and blood disorders.  Cancer and treatments for cancer.  HIV (human immunodeficiency virus) and AIDS (acquired immunodeficiency syndrome).  Infections, medicines, and autoimmune disorders that destroy red blood cells. What are the signs or symptoms? Symptoms of this condition include:  Minor weakness.  Dizziness.  Headache, or difficulties concentrating and sleeping.  Heartbeats  that feel irregular or faster than normal (palpitations).  Shortness of breath, especially with exercise.  Pale skin, lips, and nails, or cold hands and feet.  Indigestion and nausea. Symptoms may occur suddenly or develop slowly. If your anemia is mild, you may not have symptoms. How is this diagnosed? This condition is diagnosed based on blood tests, your medical history, and a physical exam. In some cases, a test may be needed in which cells are removed from the soft tissue inside of a bone and looked at under a microscope (bone marrow biopsy). Your health care provider may also check your stool (feces) for blood and may do additional testing to look for the cause of your bleeding. Other tests may include:  Imaging tests, such as a CT scan or MRI.  A procedure to see inside your esophagus and stomach (endoscopy).  A procedure to see inside your colon and rectum (colonoscopy). How is this treated? Treatment for this condition depends on the cause. If you continue to lose a lot of blood, you may need to be treated at a hospital. Treatment may include:  Taking supplements of iron, vitamin N46, or folic acid.  Taking a hormone medicine (erythropoietin) that can help to stimulate red blood cell growth.  Having a blood transfusion. This may be needed if you lose a lot of blood.  Making changes to your diet.  Having surgery to remove your spleen. Follow these instructions at home:  Take over-the-counter and prescription medicines only as told by your health care provider.  Take supplements only as told by your health care provider.  Follow any diet instructions that you were given by your health care provider.  Keep all follow-up visits as told by your health care provider. This is important. Contact a health care provider if:  You develop new bleeding anywhere in the body. Get help right away if:  You are very weak.  You are short of breath.  You have pain in your abdomen  or chest.  You are dizzy or feel faint.  You have trouble concentrating.  You have bloody stools, black stools, or tarry stools.  You vomit repeatedly or you vomit up blood. These symptoms may represent a serious problem that is an emergency. Do not wait to  see if the symptoms will go away. Get medical help right away. Call your local emergency services (911 in the [place] Do not drive yourself to the hospital. Summary  Anemia is a condition in which you do not have enough red blood cells or enough of a substance in your red blood cells that carries oxygen (hemoglobin).  Symptoms may occur suddenly or develop slowly.  If your anemia is mild, you may not have symptoms.  This condition is diagnosed with blood tests, a medical history, and a physical exam. Other tests may be needed.  Treatment for this condition depends on the cause of the anemia. This information is not intended to replace advice given to you by your health care provider. Make sure you discuss any questions you have with your health care provider. Document Revised: 09/30/2019 Document Reviewed: 09/30/2019 Elsevier Patient Education  2021 [employer].  Hypothyroidism  Hypothyroidism is when the thyroid gland does not make enough of certain hormones (it is underactive). The thyroid gland is a small gland located in the lower front part of the neck, just in front of the windpipe (trachea). This gland makes hormones that help control how the body uses food for energy (metabolism) as well as how the heart and brain function. These hormones also play a role in keeping your bones strong. When the thyroid is underactive, it produces too little of the hormones thyroxine (T4) and triiodothyronine (T3). What are the causes? This condition may be caused by:  Hashimoto's disease. This is a disease in which the body's disease-fighting system (immune system) attacks the thyroid gland. This is the most common cause.  Viral  infections.  Pregnancy.  Certain medicines.  Birth defects.  Past radiation treatments to the head or neck for cancer.  Past treatment with radioactive iodine.  Past exposure to radiation in the environment.  Past surgical removal of part or all of the thyroid.  Problems with a gland in the center of the brain (pituitary gland).  Lack of enough iodine in the diet. What increases the risk? You are more likely to develop this condition if:  You are female.  You have a family history of thyroid conditions.  You use a medicine called lithium.  You take medicines that affect the immune system (immunosuppressants). What are the signs or symptoms? Symptoms of this condition include:  Feeling as though you have no energy (lethargy).  Not being able to tolerate cold.  Weight gain that is not explained by a change in diet or exercise habits.  Lack of appetite.  Dry skin.  Coarse hair.  Menstrual irregularity.  Slowing of thought processes.  Constipation.  Sadness or depression. How is this diagnosed? This condition may be diagnosed based on:  Your symptoms, your medical history, and a physical exam.  Blood tests. You may also have imaging tests, such as an ultrasound or MRI. How is this treated? This condition is treated with medicine that replaces the thyroid hormones that your body does not make. After you begin treatment, it may take several weeks for symptoms to go away. Follow these instructions at home:  Take over-the-counter and prescription medicines only as told by your health care provider.  If you start taking any new medicines, tell your health care provider.  Keep all follow-up visits as told by your health care provider. This is important. ? As your condition improves, your dosage of thyroid hormone medicine may change. ? You will need to have blood tests regularly so that  your health care provider can monitor your condition. Contact a health  care provider if:  Your symptoms do not get better with treatment.  You are taking thyroid hormone replacement medicine and you: ? Sweat a lot. ? Have tremors. ? Feel anxious. ? Lose weight rapidly. ? Cannot tolerate heat. ? Have emotional swings. ? Have diarrhea. ? Feel weak. Get help right away if you have:  Chest pain.  An irregular heartbeat.  A rapid heartbeat.  Difficulty breathing. Summary  Hypothyroidism is when the thyroid gland does not make enough of certain hormones (it is underactive).  When the thyroid is underactive, it produces too little of the hormones thyroxine (T4) and triiodothyronine (T3).  The most common cause is Hashimoto's disease, a disease in which the body's disease-fighting system (immune system) attacks the thyroid gland. The condition can also be caused by viral infections, medicine, pregnancy, or past radiation treatment to the head or neck.  Symptoms may include weight gain, dry skin, constipation, feeling as though you do not have energy, and not being able to tolerate cold.  This condition is treated with medicine to replace the thyroid hormones that your body does not make. This information is not intended to replace advice given to you by your health care provider. Make sure you discuss any questions you have with your health care provider. Document Revised: 07/23/2020 Document Reviewed: 07/08/2020 Elsevier Patient Education  2021 [employer].

## 2021-01-03 NOTE — Chronic Care Management (AMB) (Signed)
Chronic Care Management   CCM RN Visit Note  01/03/2021 Name: [name] MRN: [id number] DOB: 1951-05-29  Subjective: Ashley [name] is a 70 y.o. year old female who is a primary care patient of [name], MD. The care management team was consulted for assistance with disease management and care coordination needs.    Engaged with patient by telephone for initial visit in response to provider referral for case management and/or care coordination services.   Consent to Services:  The patient was given the following information about Chronic Care Management services today, agreed to services, and gave verbal consent: 1. CCM service includes personalized support from designated clinical staff supervised by the primary care provider, including individualized plan of care and coordination with other care providers 2. 24/7 contact phone numbers for assistance for urgent and routine care needs. 3. Service will only be billed when office clinical staff spend 20 minutes or more in a month to coordinate care. 4. Only one practitioner may furnish and bill the service in a calendar month. 5.The patient may stop CCM services at any time (effective at the end of the month) by phone call to the office staff. 6. The patient will be responsible for cost sharing (co-pay) of up to 20% of the service fee (after annual deductible is met). Patient agreed to services and consent obtained.  Patient agreed to services and verbal consent obtained.   Assessment: Review of patient past medical history, allergies, medications, health status, including review of consultants reports, laboratory and other test data, was performed as part of comprehensive evaluation and provision of chronic care management services.   SDOH (Social Determinants of Health) assessments and interventions performed:  SDOH Interventions   Flowsheet Row Most Recent Value  SDOH Interventions   Food Insecurity Interventions Intervention  Not Indicated  Financial Strain Interventions Intervention Not Indicated  Intimate Partner Violence Interventions Intervention Not Indicated  Stress Interventions Intervention Not Indicated  Transportation Interventions Intervention Not Indicated       CCM Care Plan  Allergies  Allergen Reactions  . Vancomycin Nausea And Vomiting and Palpitations  . Hydrocodone-Acetaminophen Nausea Only    Outpatient Encounter Medications as of 01/03/2021  Medication Sig Note  . acetaminophen (TYLENOL) 325 MG tablet Take 650 mg by mouth every 6 (six) hours as needed.   [name] albuterol (VENTOLIN HFA) 108 (90 Base) MCG/ACT inhaler INHALE 2 PUFFS BY MOUTH INTO THE LUNGS EVERY 3 HOURS AS NEEDED FOR WHEEZING   . allopurinol (ZYLOPRIM) 100 MG tablet TAKE ONE TABLET BY MOUTH ONCE DAILY   . ALPRAZolam (XANAX) 0.5 MG tablet TAKE 1/2 TO 1 TABLET BY MOUTH AT BEDTIME AS NEEDED FOR ANXIETY   . amitriptyline (ELAVIL) 25 MG tablet TAKE ONE TABLET BY MOUTH AT BEDTIME   . amLODipine (NORVASC) 2.5 MG tablet Take 2.5 mg by mouth daily.   [name] atorvastatin (LIPITOR) 40 MG tablet TAKE ONE TABLET BY MOUTH ONCE DAILY   . colchicine 0.6 MG tablet TAKE TWO TABLETS BY MOUTH AT FIRST SIGN OF GOUT FLARE FOLLOW BY 1 TABLET DAILY UNTIL GOUT FLARE RESOLVES 01/03/2021: Reports taking only when gout flares up  . cyanocobalamin (,VITAMIN B-12,) 1000 MCG/ML injection Inject 1 mL into the muscle monthly (Patient taking differently: Inject 1,000 mcg into the muscle every 30 (thirty) days.)   . ENTRESTO 49-51 MG Take 1 tablet by mouth 2 (two) times daily.   [name] escitalopram (LEXAPRO) 10 MG tablet TAKE ONE TABLET BY MOUTH ONCE DAILY   .  ferrous sulfate 325 (65 FE) MG tablet Take 325 mg by mouth daily with breakfast.   . fluticasone (FLONASE) 50 MCG/ACT nasal spray Place 1 spray into both nostrils as needed for allergies or rhinitis.   . folic acid (FOLVITE) 1 MG tablet Take 1 mg by mouth daily.   . GNP MUCUS ER 600 MG 12 hr tablet Take 600 mg by  mouth 2 (two) times daily. 01/03/2021: Reports taking as needed  . ipratropium-albuterol (DUONEB) 0.5-2.5 (3) MG/3ML SOLN USE 1 VIAL VIA NEBULIZER EVERY 6 HOURS AS NEEDED   . levothyroxine (SYNTHROID) 50 MCG tablet Take 1 tablet by mouth daily.   . magnesium hydroxide (MILK OF MAGNESIA) 400 MG/5ML suspension Take by mouth.   . magnesium oxide (MAG-OX) 400 (241.3 Mg) MG tablet Take 1 tablet by mouth daily.   . metFORMIN (GLUCOPHAGE-XR) 500 MG 24 hr tablet TAKE TWO TABLETS BY MOUTH EVERY MORNING   . omeprazole (PRILOSEC) 40 MG capsule TAKE ONE CAPSULE BY MOUTH ONCE DAILY (Patient taking differently: Take 40 mg by mouth in the morning and at bedtime.) 01/03/2021: Reports taking daily  . potassium chloride (KLOR-CON) 10 MEQ tablet Take 10 mEq by mouth 2 (two) times daily.   [name] senna-docusate (SENOKOT-S) 8.6-50 MG tablet Take by mouth.   . torsemide (DEMADEX) 10 MG tablet Take 10 mg by mouth daily. 01/03/2021: Reports taking daily now  . traZODone (DESYREL) 50 MG tablet TAKE 1/2 TO 1 TABLET BY MOUTH EVERY EVENING 1 HOUR BEFORE BEDTIME   . TRELEGY ELLIPTA 100-62.5-25 MCG/INH AEPB INHALE 1 PUFF BY MOUTH INTO THE LUNGS EVERY DAY   . warfarin (COUMADIN) 2 MG tablet Take 4 mg by mouth at bedtime. Monday, Wednesday and Friday 3 mg and AOD takes 4 mg.   . amiodarone (PACERONE) 200 MG tablet Take 200 mg by mouth daily. (Patient not taking: No sig reported)   . lisinopril (ZESTRIL) 20 MG tablet Take 1 tablet (20 mg total) by mouth daily. (Patient not taking: Reported on 01/03/2021) 01/03/2021: Medication was stopped  . Magnesium Oxide 400 (240 Mg) MG TABS Take 1 tablet by mouth daily. 9/70/2637: duplicate   No facility-administered encounter medications on file as of 01/03/2021.    Patient Active Problem List   Diagnosis Date Noted  . Sternal wound infection 12/23/2020  . Anticoagulation goal of INR 1.5 to 2.5 12/23/2020  . LVAD (left ventricular assist device) present ([place]) 12/23/2020  . Complication involving  left ventricular assist device (LVAD) 12/23/2020  . Hypothyroidism 12/23/2020  . Snoring 07/05/2020  . Pressure injury of skin 01/01/2020  . CKD (chronic kidney disease), stage IIIa 12/31/2019  . Pulmonary edema cardiac cause ([place]) 12/25/2019  . Hyperlipidemia   . Hyperkalemia   . Gouty arthritis of both feet 11/20/2019  . Mitral stenosis with insufficiency   . Hypertensive urgency   . Depression   . Hyperglycemia 09/22/2019  . Presence of permanent cardiac pacemaker   . Acute blood loss anemia   . Chronic kidney disease   . Hypertension   . CHF (congestive heart failure) ([place]) 07/16/2019  . Insomnia 05/07/2019  . Suspected COVID-19 virus infection 03/23/2019  . AVM (arteriovenous malformation) of small bowel, acquired   . Anemia, iron deficiency 11/10/2018  . Rectal polyp   . Benign neoplasm of cecum   . Barrett's esophagus without dysplasia   . Chronic diastolic heart failure ([place]) 07/03/2018  . HTN (hypertension) 07/03/2018  . COPD with emphysema ([place]) 06/28/2018  . B12 deficiency anemia 06/28/2018  .  Personal history of colon cancer   . Polyp of sigmoid colon   . Diverticulosis of large intestine without diverticulitis   . 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 Renal artery stenosis ([place]) 10/03/2016  . 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 Tobacco abuse counseling 10/11/2014  . Tobacco abuse 10/11/2014  . Chronic right hip pain 08/26/2014  . Atherosclerosis of native artery of extremity with intermittent claudication ([place]) 06/18/2013  . Preoperative evaluation to rule out surgical contraindication 06/18/2013  . CAD (coronary artery disease) 06/01/2013  . GERD (gastroesophageal reflux disease) 06/01/2013  . Hypercholesterolemia 06/01/2013  . Tubular adenoma of colon 06/01/2013  . Major depressive disorder in remission ([place]) 06/01/2013    Conditions to be addressed/monitored:CHF, COPD and Anemia  Problem: COPD and Heart Failure  complications causing hospitalization   Priority: High  Onset Date: 08/16/2020  Long-Range Goal: Patient will report no COPD or heart failure exacerbations causing hospitalizations within the next 90 days.   Start Date: 01/03/2021  Expected End Date: 07/05/2021  Priority: High  Current Barriers:  [name] Knowledge deficit related to self care management of CHF and COPD as evidenced by recent hospitalization related to fluid retention and pneumonia.  Discharged from [facility] on [facility].  Treated with antibiotics and Demadex.  Patient stating she feels much better and is getting stronger every day.  Reports appetite is coming back and she is almost at baseline caring for self.  Can change out her LVAD dressings herself or with assistance from friend.  Weight this morning was 143 pounds.  Denies any increase in shortness or breath or swelling in extremities.  States cough is better and reports not having to use rescue inhaler or nebulizer since home from hospital. Case Manager Clinical Goal(s):  . patient will verbalize understanding of Heart Failure and COPD Action Plan and when to call doctor . patient will take all mediations as prescribed . patient will weigh daily and record (notifying MD of 3 lb weight gain over night or 5 lb in a week) Interventions:  . Collaboration with [name], MD regarding development and update of comprehensive plan of care as evidenced by provider attestation and co-signature . Inter-disciplinary care team collaboration (see longitudinal plan of care) . Assessed need for readable accurate scales in home . Provided education about placing scale on hard, flat surface . Advised patient to weigh each morning after emptying bladder . Discussed importance of daily weight and advised patient to weigh and record daily . Reviewed role of diuretics in prevention of fluid overload and management of heart failure . Sending education on hypothyroidism . Encouraged patient to  use incentive spirometer and do coughing and deep breathing exercises . Reviewed medications and encouraged medication compliance . Encouraged patient to obtain home CO2 detectors and to notify this RNCM if she is unable to . Discussed COPD triggers and action plan and when to use inhaler versus nebulizer . Confirmed patient has LVAD dressing supplies Patient Goals/Self-Care Activities . Call office if I gain more than 2 pounds in one day or 5 pounds in one week . Use salt in moderation . Weigh myself daily and record in log along with LVAD daily parameters for provider review . Monitor lower extremities and abdomen for swelling . Take medications as prescribed . Develop a rescue plan . Eliminate symptom triggers at home . Follow rescue plan if symptoms flare-up . Keep follow-up appointments  . Obtain CO2 Detectors for home . Use Incentive Spirometer and coughing and  deep breathing exercises Follow Up Plan: The care management team will reach out to the patient again over the next 20 business days.     Problem: Recurrent GI Bleeds leading to hospitalizations   Priority: High  Long-Range Goal: Patient will report no recurrent hospitalizations related to GI bleed in the next 90 days   Start Date: 01/03/2021  Expected End Date: 07/05/2021  Priority: High  Current Barriers:  [name] Knowledge deficits related to self health management of anemia or bleeding as evidenced by recent recurrent hospitalizations.  Patient reports she could not tell when her hemoglobin was starting to drop.  Only noticed something was wrong when LVAD alerts alarmed when hemoglobin was significantly low. Nurse Case Manager Clinical Goal(s):   patient will take all medications exactly as prescribed and will call provider for medication related questions  patient will not experience hospital admission within the next 90 days related to GI bleeding. Hospital Admissions in last 6 months =6  patient will attend all scheduled  medical appointments: LVAD clinic/INR check 3/2 Interventions:  . Collaboration with [name], MD regarding development and update of comprehensive plan of care as evidenced by provider attestation and co-signature . Inter-disciplinary care team collaboration (see longitudinal plan of care) . Medications reviewed . Recommended promotion of rest and energy-conserving measures to manage fatigue, such as balancing activity with periods of rest, encouraged strategies to prevent falls related to fatigue, weakness and dizziness . Encouraged sitting before standing and using an assistive device . Encouraged dietary changes to increase dietary intake of iron, Vitamin U98 and folic acid as advised/prescribed . Provided education about signs and symptoms of active bleeding and advised when to call provider or 911 . Signs/symptoms of bleeding reviewed (weakness, exhaustion, paleness, sleepiness, blood in stool) Patient Goals/Self-Care Activities: [name] Monitor yourself for low hemoglobin (feeling tired, exhausted, pale, shortness of breath) . Track what makes symptoms worse and what makes them better  . Contact provider for symptoms right away . Pay attention to LVAD alerts . Monitor stools for blood . Attend appointments, especially INR checks Follow Up Plan: The care management team will reach out to the patient again over the next 20 business days.       Plan:The care management team will reach out to the patient again over the next 20 business days.  Hubert Azure RN, MSN RN Care Management Coordinator [place] [phone] [email or web address]

## 2021-01-04 ENCOUNTER — Ambulatory Visit (INDEPENDENT_AMBULATORY_CARE_PROVIDER_SITE_OTHER): Payer: Medicare Other | Admitting: Pharmacist

## 2021-01-04 DIAGNOSIS — I5032 Chronic diastolic (congestive) heart failure: Secondary | ICD-10-CM

## 2021-01-04 DIAGNOSIS — I25700 Atherosclerosis of coronary artery bypass graft(s), unspecified, with unstable angina pectoris: Secondary | ICD-10-CM

## 2021-01-04 DIAGNOSIS — J439 Emphysema, unspecified: Secondary | ICD-10-CM

## 2021-01-04 DIAGNOSIS — I5043 Acute on chronic combined systolic (congestive) and diastolic (congestive) heart failure: Secondary | ICD-10-CM | POA: Diagnosis not present

## 2021-01-04 DIAGNOSIS — D509 Iron deficiency anemia, unspecified: Secondary | ICD-10-CM | POA: Diagnosis not present

## 2021-01-04 DIAGNOSIS — D62 Acute posthemorrhagic anemia: Secondary | ICD-10-CM

## 2021-01-04 DIAGNOSIS — F325 Major depressive disorder, single episode, in full remission: Secondary | ICD-10-CM

## 2021-01-04 DIAGNOSIS — E039 Hypothyroidism, unspecified: Secondary | ICD-10-CM | POA: Diagnosis not present

## 2021-01-04 DIAGNOSIS — Z95811 Presence of heart assist device: Secondary | ICD-10-CM

## 2021-01-04 NOTE — Patient Instructions (Signed)
Visit Information  PATIENT GOALS: Goals Addressed              This Visit's Progress     Patient Stated   .  PharmD - Medication Monitoring (pt-stated)        Patient Goals/Self-Care Activities . Over the next 90 days, patient will:  - take medications as prescribed check blood pressure periodically, document, and provide at future appointments weigh daily, and contact provider if weight gain of >3 lbs/day, > 5 lbs/week        Patient verbalizes understanding of instructions provided today and agrees to view in [place].   Plan: Telephone follow up appointment with care management team member scheduled for:  ~ 8 weeks  [name] Darnelle Maffucci, PharmD, [place], [place] Clinical Pharmacist [employer] [facility] [employer] [phone]

## 2021-01-04 NOTE — Chronic Care Management (AMB) (Signed)
Chronic Care Management Pharmacy Note  01/04/2021 Name:  Ashley [name] MRN:  [id number] DOB:  Aug 06, 1951  Subjective: Ashley [name] is an 70 y.o. year old female who is a primary patient of [name], [name], MD.  The CCM team was consulted for assistance with disease management and care coordination needs.    Engaged with patient by telephone for follow up visit in response to provider referral for pharmacy case management and/or care coordination services.   Consent to Services:  The patient was given information about Chronic Care Management services, agreed to services, and gave verbal consent prior to initiation of services.  Please see initial visit note for detailed documentation.   Patient Care Team: [name], MD as PCP - General (Internal Medicine) Isaias Cowman, MD as Consulting Physician (Cardiology) De Hollingshead, RPH-CPP as Pharmacist (Pharmacist) Edrick Kins, MD as Rounding Team (Internal Medicine) Leona Singleton, RN as Case Manager  Recent office visits:  2/28 - RN CM; reported improvement in appetite, reduction in cough  2/16 - PCP visit, hospitalization f/u (s/p [facility] admission 1/20-1/27 GIB),  torsemide 20 mg daily, potassium 20 mEq  Recent consult visits: None since our last appointment  Hospital visits:  2/18-2/24- [facility] for COPD exacerbation, amio d/c (?pulmonary toxicity); stopped lisinopril, transition to Entresto 49/51 mg BID, discharged w/ cefuroxime for e coli UTI  Objective:  Lab Results  Component Value Date   CREATININE 1.00 12/24/2020   BUN 15 12/24/2020   GFR 58.23 (L) 12/24/2020   GFRNONAA 38 (L) 01/01/2020   GFRAA 44 (L) 01/01/2020   NA 133 (L) 12/24/2020   K 3.5 12/24/2020   CALCIUM 8.4 12/24/2020   CO2 26 12/24/2020    Lab Results  Component Value Date/Time   HGBA1C 4.7 10/15/2020 12:00 AM   HGBA1C 5.9 07/05/2020 02:11 PM   HGBA1C 6.7 (H) 01/01/2020 03:28 AM   GFR 58.23 (L) 12/24/2020 11:11  AM   GFR 40.47 (L) 07/05/2020 02:11 PM   MICROALBUR <0.7 07/05/2020 02:13 PM    Last diabetic Eye exam: No results found for: HMDIABEYEEXA  Last diabetic Foot exam: No results found for: HMDIABFOOTEX   Lab Results  Component Value Date   CHOL 155 01/01/2020   HDL 50 01/01/2020   LDLCALC 91 01/01/2020   LDLDIRECT 62.0 11/08/2018   TRIG 70 01/01/2020   CHOLHDL 3.1 01/01/2020    Hepatic Function Latest Ref Rng & Units 12/24/2020 02/04/2020 12/31/2019  Total Protein 6.0 - 8.3 g/dL 6.2 - 7.0  Albumin 3.5 - 5.2 g/dL 3.1(L) 3.8 3.8  AST 0 - 37 U/L 152(H) 16 21  ALT 0 - 35 U/L 122(H) 15 18  Alk Phosphatase 39 - 117 U/L 111 56 64  Total Bilirubin 0.2 - 1.2 mg/dL 1.1 - 1.9(H)  Bilirubin, Direct 0.0 - 0.2 mg/dL - - -    Lab Results  Component Value Date/Time   TSH 0.725 09/23/2019 04:43 AM   TSH 1.791 07/17/2018 01:07 PM   FREET4 0.91 09/22/2019 03:35 PM    CBC Latest Ref Rng & Units 12/24/2020 02/04/2020 01/01/2020  WBC 4.0 - 10.5 K/uL 10.0 9.2 11.6(H)  Hemoglobin 12.0 - 15.0 g/dL 8.1(L) 12.2 12.0  Hematocrit 36.0 - 46.0 % 24.3(L) 36 34.4(L)  Platelets 150.0 - 400.0 K/uL 383.0 224 166   Clinical ASCVD: Yes  The ASCVD Risk score Mikey Bussing DC Jr., et al., 2013) failed to calculate for the following reasons:   The patient has a prior MI or stroke diagnosis  Depression screen [facility] 2/9 01/03/2021 11/12/2020 08/16/2020  Decreased Interest 0 0 0  Down, Depressed, Hopeless 0 0 0  PHQ - 2 Score 0 0 0  Altered sleeping - - -  Tired, decreased energy - - -  Change in appetite - - -  Feeling bad or failure about yourself  - - -  Trouble concentrating - - -  Moving slowly or fidgety/restless - - -  Suicidal thoughts - - -  PHQ-9 Score - - -  Difficult doing work/chores - - -  Some recent data might be hidden     Social History   Tobacco Use  Smoking Status Former Smoker  . Packs/day: 0.25  . Types: Cigarettes  . Quit date: 06/19/2019  . Years since quitting: 1.5  Smokeless Tobacco  Never Used  Tobacco Comment   reports smoking 2-3 cigarettes/ day   BP Readings from Last 3 Encounters:  07/05/20 102/70  05/07/20 140/87  01/01/20 121/73   Pulse Readings from Last 3 Encounters:  07/05/20 95  05/07/20 87  01/01/20 82   Wt Readings from Last 3 Encounters:  12/22/20 142 lb (64.4 kg)  07/05/20 166 lb 3.2 oz (75.4 kg)  05/07/20 169 lb (76.7 kg)    Assessment/Interventions: Review of patient past medical history, allergies, medications, health status, including review of consultants reports, laboratory and other test data, was performed as part of comprehensive evaluation and provision of chronic care management services.   SDOH:  (Social Determinants of Health) assessments and interventions performed: Yes SDOH Interventions   Flowsheet Row Most Recent Value  SDOH Interventions   Financial Strain Interventions Intervention Not Indicated  [patient has medicare extra help]      CCM Care Plan  Allergies  Allergen Reactions  . Vancomycin Nausea And Vomiting and Palpitations  . Hydrocodone-Acetaminophen Nausea Only    Medications Reviewed Today    Reviewed by De Hollingshead, RPH-CPP (Pharmacist) on 01/04/21 at 1457  Med List Status: <None>  Medication Order Taking? Sig Documenting Provider Last Dose Status Informant  acetaminophen (TYLENOL) 325 MG tablet [id number] Yes Take 650 mg by mouth every 6 (six) hours as needed. [provider] Taking Active   albuterol (VENTOLIN HFA) 108 (90 Base) MCG/ACT inhaler [id number] Yes INHALE 2 PUFFS BY MOUTH INTO THE LUNGS EVERY 3 HOURS AS NEEDED FOR WHEEZING Ezekiel Slocumb, DO Taking Active Self           Med Note De Hollingshead   Tue Dec 14, 2020  1:23 PM)    allopurinol (ZYLOPRIM) 100 MG tablet [id number] Yes TAKE ONE TABLET BY MOUTH ONCE DAILY [name], MD Taking Active   ALPRAZolam Duanne Moron) 0.5 MG tablet [id number] Yes TAKE 1/2 TO 1 TABLET BY MOUTH AT BEDTIME AS NEEDED FOR ANXIETY [name],  MD Taking Active   amitriptyline (ELAVIL) 25 MG tablet [id number] Yes TAKE ONE TABLET BY MOUTH AT BEDTIME [name], MD Taking Active   amLODipine (NORVASC) 2.5 MG tablet [id number] Yes Take 2.5 mg by mouth daily. [provider] Taking Active   atorvastatin (LIPITOR) 40 MG tablet [id number] Yes TAKE ONE TABLET BY MOUTH ONCE DAILY [name], MD Taking Active   colchicine 0.6 MG tablet [id number] Yes TAKE TWO TABLETS BY MOUTH AT FIRST SIGN OF GOUT FLARE FOLLOW BY 1 TABLET DAILY UNTIL GOUT FLARE RESOLVES [provider] Taking Active            Med Note Shelby Mattocks, Sheppard Evens D   Mon  Jan 03, 2021 10:16 AM) Reports taking only when gout flares up  cyanocobalamin (,VITAMIN B-12,) 1000 MCG/ML injection [id number] Yes Inject 1 mL into the muscle monthly  Patient taking differently: Inject 1,000 mcg into the muscle every 30 (thirty) days.   Sherlene Shams, MD Taking Active   ENTRESTO 49-51 MG [id number] Yes Take 1 tablet by mouth 2 (two) times daily. [provider] Taking Active   escitalopram (LEXAPRO) 10 MG tablet [id number] Yes TAKE ONE TABLET BY MOUTH ONCE DAILY Sherlene Shams, MD Taking Active   ferrous sulfate 325 (65 FE) MG tablet [id number] Yes Take 325 mg by mouth daily with breakfast. Sherlene Shams, MD Taking Active Self  fluticasone (FLONASE) 50 MCG/ACT nasal spray [id number] Yes Place 1 spray into both nostrils as needed for allergies or rhinitis. Sherlene Shams, MD Taking Active Self  folic acid (FOLVITE) 1 MG tablet [id number] Yes Take 1 mg by mouth daily. [provider] Taking Active   GNP MUCUS ER 600 MG 12 hr tablet [id number] Yes Take 600 mg by mouth 2 (two) times daily. [provider] Taking Active            Med Note Davina Poke Jan 03, 2021 10:17 AM) Reports taking as needed  ipratropium-albuterol (DUONEB) 0.5-2.5 (3) MG/3ML SOLN [id number] Yes USE 1 VIAL VIA NEBULIZER EVERY 6 HOURS AS NEEDED Sherlene Shams, MD Taking Active             Med Note Lourena Simmonds   Tue Dec 14, 2020  1:25 PM)    levothyroxine (SYNTHROID) 50 MCG tablet [id number] Yes Take 1 tablet by mouth daily. [provider] Taking Active   magnesium hydroxide (MILK OF MAGNESIA) 400 MG/5ML suspension [id number]  Take by mouth. [provider]  Active   magnesium oxide (MAG-OX) 400 (241.3 Mg) MG tablet [id number] Yes Take 1 tablet by mouth daily. [provider] Taking Active   metFORMIN (GLUCOPHAGE-XR) 500 MG 24 hr tablet [id number] Yes TAKE TWO TABLETS BY MOUTH EVERY MORNING Sherlene Shams, MD Taking Active   omeprazole (PRILOSEC) 40 MG capsule [id number] Yes TAKE ONE CAPSULE BY MOUTH ONCE DAILY  Patient taking differently: Take 40 mg by mouth in the morning and at bedtime.   Sherlene Shams, MD Taking Active            Med Note Davina Poke Jan 03, 2021 10:18 AM) Reports taking daily  potassium chloride (KLOR-CON) 10 MEQ tablet [id number] Yes Take 10 mEq by mouth 2 (two) times daily. [provider] Taking Active            Med Note Lourena Simmonds   Tue Dec 14, 2020  1:28 PM)    senna-docusate (SENOKOT-S) 8.6-50 MG tablet [id number] Yes Take by mouth. [provider] Taking Active   torsemide (DEMADEX) 10 MG tablet [id number] Yes Take 10 mg by mouth daily. Marcina Millard, MD Taking Active            Med Note Davina Poke Jan 03, 2021 10:19 AM) Reports taking daily now  traZODone (DESYREL) 50 MG tablet [id number] Yes TAKE 1/2 TO 1 TABLET BY MOUTH EVERY EVENING 1 HOUR BEFORE BEDTIME Sherlene Shams, MD Taking Active   TRELEGY ELLIPTA 100-62.5-25 MCG/INH AEPB [id number] Yes INHALE 1 PUFF BY MOUTH INTO THE LUNGS EVERY DAY Sherlene Shams, MD Taking Active   warfarin (COUMADIN) 2 MG tablet [id number]  Yes Take 4 mg by mouth at bedtime. Monday, Wednesday and Friday 3 mg and AOD takes 4 mg. [provider] Taking Active           Patient Active Problem List    Diagnosis Date Noted  . Sternal wound infection 12/23/2020  . Anticoagulation goal of INR 1.5 to 2.5 12/23/2020  . LVAD (left ventricular assist device) present ([place]) 12/23/2020  . Complication involving left ventricular assist device (LVAD) 12/23/2020  . Hypothyroidism 12/23/2020  . Snoring 07/05/2020  . Pressure injury of skin 01/01/2020  . CKD (chronic kidney disease), stage IIIa 12/31/2019  . Pulmonary edema cardiac cause ([place]) 12/25/2019  . Hyperlipidemia   . Hyperkalemia   . Gouty arthritis of both feet 11/20/2019  . Mitral stenosis with insufficiency   . Hypertensive urgency   . Depression   . Hyperglycemia 09/22/2019  . Presence of permanent cardiac pacemaker   . Acute blood loss anemia   . Chronic kidney disease   . Hypertension   . CHF (congestive heart failure) ([place]) 07/16/2019  . Insomnia 05/07/2019  . Suspected COVID-19 virus infection 03/23/2019  . AVM (arteriovenous malformation) of small bowel, acquired   . Anemia, iron deficiency 11/10/2018  . Rectal polyp   . Benign neoplasm of cecum   . Barrett's esophagus without dysplasia   . Chronic diastolic heart failure ([place]) 07/03/2018  . HTN (hypertension) 07/03/2018  . COPD with emphysema ([place]) 06/28/2018  . B12 deficiency anemia 06/28/2018  . Personal history of colon cancer   . Polyp of sigmoid colon   . Diverticulosis of large intestine without diverticulitis   . 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 Renal artery stenosis ([place]) 10/03/2016  . 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 Tobacco abuse counseling 10/11/2014  . Tobacco abuse 10/11/2014  . Chronic right hip pain 08/26/2014  . Atherosclerosis of native artery of extremity with intermittent claudication ([place]) 06/18/2013  . Preoperative evaluation to rule out surgical contraindication 06/18/2013  . CAD (coronary artery disease) 06/01/2013  . GERD (gastroesophageal reflux disease) 06/01/2013  . Hypercholesterolemia  06/01/2013  . Tubular adenoma of colon 06/01/2013  . Major depressive disorder in remission ([place]) 06/01/2013    Immunization History  Administered Date(s) Administered  . Fluad Quad(high Dose 65+) 09/23/2019  . Influenza Split 08/20/2014  . Influenza,inj,Quad PF,6+ Mos 06/28/2016, 07/29/2018  . Influenza-Unspecified 08/04/2020  . Janssen (J&J) SARS-COV-2 Vaccination 09/07/2020  . PFIZER(Purple Top)SARS-COV-2 Vaccination 11/30/2020  . Pneumococcal Polysaccharide-23 08/20/2014, 06/21/2019    Conditions to be addressed/monitored:  Hypertension, Hyperlipidemia, Diabetes, Heart Failure, Coronary Artery Disease, COPD and Depression  Care Plan : Medication Management  Updates made by De Hollingshead, RPH-CPP since 01/04/2021 12:00 AM    Problem: CAD, CHF, gout, mitral valve insufficiency, COPD     Long-Range Goal: Disease Progression Prevention   Recent Progress: On track  Priority: High  Note:   Current Barriers:  . Complex patient with high risk for hospital admission  Pharmacist Clinical Goal(s):  [name] Over the next 90 days, patient will achieve adherence to monitoring guidelines and medication adherence to achieve therapeutic efficacy. through collaboration with PharmD and provider.   Interventions: . 1:1 collaboration with [name], MD regarding development and update of comprehensive plan of care as evidenced by provider attestation and co-signature . Inter-disciplinary care team collaboration (see longitudinal plan of care) . Comprehensive medication review performed; medication list updated in electronic medical record  Health Maintenance: . Admitted to [facility] 2/18-2/24 for COPD exacerbation. Amiodarone  d/c due to thought for pulmonary toxicity. Reports feeling much better since discharge, appetite improving. Reviewed health with RN CM yesterday, so our call today was short.   Recent GIB: . Controlled; current treatment: pantoprazole 40 mg daily. Denies any  BRBPR, tarry stools, blood on the toilet paper.  . Continue daily pantoprazole as per previous discharge instructions  HFrEF (LVAD) . Appropriately managed; current treatment: Entresto 49/51 mg BID; torsemide 20 mg daily, potassium 20 mEq daily (increased at hospital discharge); follows closely w/ [facility] cardiology; magnesium 400 mg daily;  . [facility] appointment tomorrow at [facility].  [name] LVAD anticoagulation; goal INR 1.5-2.5 d/t hx GIB; warfarin 4 mg M, WF, alternating w/ 2 mg- managed by [facility] . Recommended to continue current regimen w/ cardiology collaboration.   Hyperlipidemia, CAD (hx NSTEMI, s/p CABG x3 and mitral valve repair in 2007) MVR w/ replacement 2021 . Controlled per last lipid panel; current treatment: atorvastatin 40 mg daily . Antiplatelet regimen: none- held after recent GIB . Recommended to continue current regimen at this time  Chronic Obstructive Pulmonary Disease s/p tobacco cessation: . Controlled; current treatment: Trelegy 100/25/62.5 daily, albuterol nebulizer PRN- has been using frequently over the past several days . 1 exacerbations requiring treatment (hospitalization) in the last 6 months  . Recommended to continue current regimen at this time  Gout: . Not at goal, last uric acid per Care Everywhere still >6. current treatment: allopurinol 100 mg daily, colchicine PRN. Has not required colchicine since our last call in December.  . Recommended to continue current regimen at this time.  . Consider updated uric acid moving forward, could consider titration of allopurinol.  Anxiety/Depression w/ Insomnia: . Appropriately managed; current treatment: escitalopram 10 mg daily, amitriptyline 25 mg QPM, trazodone 50 mg QPM, PRN alprazolam 0.25 mg QPM;  . Encouraged to continue current regimen at this time. Consider need for psychiatry or therapy referral moving forward for support in managing chronic conditions.   Diabetes: . Controlled; current regimen: metformin  XR 1000 mg daily . Last A1c per Care Everywhere <5% . Consider discontinuation of metformin to reduce pill burden.   Hypothyroidism: . 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 Started at January hospitalization. Due for repeat TSH ~ first week of March. Encouraged patient to talk to the LVAD team tomorrow if they check lab work to see if they can add TSH on and forward results to Dr. Derrel Nip to avoid multiple lab sticks. Patient notes she will discuss that with them tomorrow.  . Continue to follow. Continue current regimen at this time.   Patient Goals/Self-Care Activities . Over the next 90 days, patient will:  - take medications as prescribed check blood pressure periodically, document, and provide at future appointments weigh daily, and contact provider if weight gain of >3 lbs/day, > 5 lbs/week  Follow Up Plan: Telephone follow up appointment with care management team member scheduled for: ~ 8 weeks      Medication Assistance: None required.  Patient affirms current coverage meets needs.  Patient's preferred pharmacy is:  [facility], [place], [place] [address] Phone: [phone] Fax: [place], [place] - [address] [place] [address] Phone: [phone] Fax: [phone]  [id number] - [place], [place] -  [place] [address] [place] [address] Phone: [phone] Fax: [phone]   Care Plan and Follow Up Patient Decision:  Patient agrees to Care Plan and Follow-up.  Plan: Telephone follow up appointment with care management team member scheduled for:  ~ 8 weeks  [name] Darnelle Maffucci, PharmD, [place], [place] Clinical Pharmacist [employer] [facility] [employer] [phone]

## 2021-01-05 DIAGNOSIS — T462X1D Poisoning by other antidysrhythmic drugs, accidental (unintentional), subsequent encounter: Secondary | ICD-10-CM | POA: Diagnosis not present

## 2021-01-05 DIAGNOSIS — D509 Iron deficiency anemia, unspecified: Secondary | ICD-10-CM | POA: Diagnosis not present

## 2021-01-05 DIAGNOSIS — I5023 Acute on chronic systolic (congestive) heart failure: Secondary | ICD-10-CM | POA: Diagnosis not present

## 2021-01-05 DIAGNOSIS — N184 Chronic kidney disease, stage 4 (severe): Secondary | ICD-10-CM | POA: Diagnosis not present

## 2021-01-05 DIAGNOSIS — Z951 Presence of aortocoronary bypass graft: Secondary | ICD-10-CM | POA: Diagnosis not present

## 2021-01-05 DIAGNOSIS — T462X4D Poisoning by other antidysrhythmic drugs, undetermined, subsequent encounter: Secondary | ICD-10-CM | POA: Diagnosis not present

## 2021-01-05 DIAGNOSIS — R918 Other nonspecific abnormal finding of lung field: Secondary | ICD-10-CM | POA: Diagnosis not present

## 2021-01-05 DIAGNOSIS — Z95811 Presence of heart assist device: Secondary | ICD-10-CM | POA: Diagnosis not present

## 2021-01-05 DIAGNOSIS — T462X5A Adverse effect of other antidysrhythmic drugs, initial encounter: Secondary | ICD-10-CM | POA: Diagnosis not present

## 2021-01-05 DIAGNOSIS — I130 Hypertensive heart and chronic kidney disease with heart failure and stage 1 through stage 4 chronic kidney disease, or unspecified chronic kidney disease: Secondary | ICD-10-CM | POA: Diagnosis not present

## 2021-01-05 DIAGNOSIS — T462X5D Adverse effect of other antidysrhythmic drugs, subsequent encounter: Secondary | ICD-10-CM | POA: Diagnosis not present

## 2021-01-05 DIAGNOSIS — I5022 Chronic systolic (congestive) heart failure: Secondary | ICD-10-CM | POA: Diagnosis not present

## 2021-01-05 DIAGNOSIS — E0580 Other thyrotoxicosis without thyrotoxic crisis or storm: Secondary | ICD-10-CM | POA: Diagnosis not present

## 2021-01-05 DIAGNOSIS — I10 Essential (primary) hypertension: Secondary | ICD-10-CM | POA: Diagnosis not present

## 2021-01-05 DIAGNOSIS — I255 Ischemic cardiomyopathy: Secondary | ICD-10-CM | POA: Diagnosis not present

## 2021-01-05 DIAGNOSIS — E1142 Type 2 diabetes mellitus with diabetic polyneuropathy: Secondary | ICD-10-CM | POA: Diagnosis not present

## 2021-01-05 DIAGNOSIS — Z7901 Long term (current) use of anticoagulants: Secondary | ICD-10-CM | POA: Diagnosis not present

## 2021-01-11 DIAGNOSIS — Z952 Presence of prosthetic heart valve: Secondary | ICD-10-CM | POA: Diagnosis not present

## 2021-01-11 DIAGNOSIS — Z7901 Long term (current) use of anticoagulants: Secondary | ICD-10-CM | POA: Diagnosis not present

## 2021-01-14 DIAGNOSIS — Z4801 Encounter for change or removal of surgical wound dressing: Secondary | ICD-10-CM | POA: Diagnosis not present

## 2021-01-14 DIAGNOSIS — Z48812 Encounter for surgical aftercare following surgery on the circulatory system: Secondary | ICD-10-CM | POA: Diagnosis not present

## 2021-01-14 DIAGNOSIS — Z95811 Presence of heart assist device: Secondary | ICD-10-CM | POA: Diagnosis not present

## 2021-01-14 DIAGNOSIS — I428 Other cardiomyopathies: Secondary | ICD-10-CM | POA: Diagnosis not present

## 2021-01-17 ENCOUNTER — Ambulatory Visit: Payer: Medicare Other | Admitting: *Deleted

## 2021-01-17 DIAGNOSIS — I25700 Atherosclerosis of coronary artery bypass graft(s), unspecified, with unstable angina pectoris: Secondary | ICD-10-CM | POA: Diagnosis not present

## 2021-01-17 DIAGNOSIS — J439 Emphysema, unspecified: Secondary | ICD-10-CM

## 2021-01-17 DIAGNOSIS — D509 Iron deficiency anemia, unspecified: Secondary | ICD-10-CM

## 2021-01-17 DIAGNOSIS — F325 Major depressive disorder, single episode, in full remission: Secondary | ICD-10-CM

## 2021-01-17 DIAGNOSIS — D62 Acute posthemorrhagic anemia: Secondary | ICD-10-CM | POA: Diagnosis not present

## 2021-01-17 DIAGNOSIS — I5032 Chronic diastolic (congestive) heart failure: Secondary | ICD-10-CM | POA: Diagnosis not present

## 2021-01-17 DIAGNOSIS — I5043 Acute on chronic combined systolic (congestive) and diastolic (congestive) heart failure: Secondary | ICD-10-CM

## 2021-01-17 DIAGNOSIS — E039 Hypothyroidism, unspecified: Secondary | ICD-10-CM

## 2021-01-17 NOTE — Patient Instructions (Addendum)
Visit Information  PATIENT GOALS: Goals Addressed            This Visit's Progress   . (RNCM) Track and Manage Fluids and Swelling   On track    Timeframe:  Long-Range Goal Priority:  High Start Date:  01/03/21                           Expected End Date:   07/05/21                    Follow Up Date 4/62022    . Call office if I gain more than 2 pounds in one day or 5 pounds in one week . Monitor weight gain for fluid versus increase in appetite . Weigh myself daily and record in log along with LVAD daily parameters for provider review . Monitor lower extremities and abdomen for swelling . Take medications as prescribed contacting Cardiology if weight gain/fluid with decrease in dose of Torsemide (fluid medication)  Why is this important?   It is important to check your weight daily and watch how much salt and liquids you have.  It will help you to manage your heart failure.    Notes:     [name] (RNCM) Track and Manage My Symptoms-COPD   On track    Timeframe:  Long-Range Goal Priority:  High Start Date:  01/03/21                           Expected End Date:   07/05/21                    Follow Up Date 34/6/22    . Develop a rescue plan and follow if symptoms flare up . Monitor and Eliminate symptom triggers at home . Obtain CO2 Detectors for home . Use Incentive Spirometer and coughing and deep breathing exercises . Contact provider for increase use of rescue inhaler/nebulizer   Why is this important?    Tracking your symptoms and other information about your health helps your doctor plan your care.   Write down the symptoms, the time of day, what you were doing and what medicine you are taking.   You will soon learn how to manage your symptoms.     Notes:     [name] (RNCM) Track My Symptoms-GI Bleed   On track    Timeframe:  Long-Range Goal Priority:  High Start Date: 01/03/21                            Expected End Date: 07/05/21                      Follow Up Date  02/09/21   . Continue to monitor yourself for low hemoglobin (feeling tired, exhausted, pale, shortness of breath) . Track what makes symptoms worse and what makes them better  . Contact provider for symptoms right away . Pay attention to LVAD alerts . Monitor stools for blood . Contact provider to verify when next INR check is scheduled   Why is this important?    Keeping track of symptoms that you have helps you to understand your condition. You will also learn what works to manage it.   You and your doctor will then be able to come up with the best treatment plan for you.  Notes:        Patient verbalizes understanding of instructions provided today and agrees to view in [place].   The care management team will reach out to the patient again over the next 30 business days.   Ashley Azure RN, MSN RN Care Management Coordinator [place] [phone] [name]     COPD Action Plan A COPD action plan is a description of what to do when you have a flare (exacerbation) of chronic obstructive pulmonary disease (COPD). Your action plan is a color-coded plan that lists the symptoms that indicate whether your condition is under control and what actions to take.  If you have symptoms in the green zone, it means you are doing well that day.  If you have symptoms in the yellow zone, it means you are having a bad day or an exacerbation.  If you have symptoms in the red zone, you need urgent medical care. Follow the plan that you and your health care provider developed. Review your plan with your health care provider at each visit. Red zone Symptoms in this zone mean that you should get medical help right away. They include:  Feeling very short of breath, even when you are resting.  Not being able to do any activities because of poor breathing.  Not being able to sleep because of poor breathing.  Fever or shaking  chills.  Feeling confused or very sleepy.  Chest pain.  Coughing up blood. If you have any of these symptoms, call emergency services (911 in the [place] or go to the nearest emergency room.   Yellow zone Symptoms in this zone mean that your condition may be getting worse. They include:  Feeling more short of breath than usual.  Having less energy for daily activities than usual.  Phlegm or mucus that is thicker than usual.  Needing to use your rescue inhaler or nebulizer more often than usual.  More ankle swelling than usual.  Coughing more than usual.  Feeling like you have a chest cold.  Trouble sleeping due to COPD symptoms.  Decreased appetite.  COPD medicines not helping as much as usual. If you experience any "yellow" symptoms:  Keep taking your daily medicines as directed.  Use your quick-relief inhaler as told by your health care provider.  If you were prescribed steroid medicine to take by mouth (oral medicine), start taking it as told by your health care provider.  If you were prescribed an antibiotic medicine, start taking it as told by your health care provider. Do not stop taking the antibiotic even if you start to feel better.  Use oxygen as told by your health care provider.  Get more rest.  Do your pursed-lip breathing exercises.  Do not smoke. Avoid any irritants in the air. If your signs and symptoms do not improve after taking these steps, call your health care provider right away.   Green zone Symptoms in this zone mean that you are doing well. They include:  Being able to do your usual activities and exercise.  Having the usual amount of coughing, including the same amount of phlegm or mucus.  Being able to sleep well.  Having a good appetite.   Where to find more information: You can find more information about COPD from:  American Lung Association, My COPD Action Plan: www.lung.org  COPD Foundation:  www.copdfoundation.[place]: https://wilson-eaton.com/ Follow these instructions at home:  Continue taking your daily medicines as told by your  health care provider.  Make sure you receive all the immunizations that your health care provider recommends, especially the pneumococcal and influenza vaccines.  Wash your hands often with soap and water. Have family members wash their hands too. Regular hand washing can help prevent infections.  Follow your usual exercise and diet plan.  Avoid irritants in the air, such as smoke.  Do not use any products that contain nicotine or tobacco. These products include cigarettes, chewing tobacco, and vaping devices, such as e-cigarettes. If you need help quitting, ask your health care provider. Summary  A COPD action plan tells you what to do when you have a flare (exacerbation) of chronic obstructive pulmonary disease (COPD).  Follow each action plan for your symptoms. If you have any symptoms in the red zone, call emergency services (911 in the [place] or go to the nearest emergency room. This information is not intended to replace advice given to you by your health care provider. Make sure you discuss any questions you have with your health care provider. Document Revised: 08/31/2020 Document Reviewed: 08/31/2020 Elsevier Patient Education  2021 [place].  Heart Failure Action Plan A heart failure action plan helps you understand what to do when you have symptoms of heart failure. Your action plan is a color-coded plan that lists the symptoms to watch for and indicates what actions to take.  If you have symptoms in the red zone, you need medical care right away.  If you have symptoms in the yellow zone, you are having problems.  If you have symptoms in the green zone, you are doing well. Follow the plan that was created by you and your health care provider. Review your plan each time you visit your health care  provider. Red zone These signs and symptoms mean you should get medical help right away:  You have trouble breathing when resting.  You have a dry cough that is getting worse.  You have swelling or pain in your legs or abdomen that is getting worse.  You suddenly gain more than 2-3 lb (0.9-1.4 kg) in 24 hours, or more than 5 lb (2.3 kg) in a week. This amount may be more or less depending on your condition.  You have trouble staying awake or you feel confused.  You have chest pain.  You do not have an appetite.  You pass out.  You have worsening sadness or depression. If you have any of these symptoms, call your local emergency services (911 in the [place] right away. Do not drive yourself to the hospital.   Yellow zone These signs and symptoms mean your condition may be getting worse and you should make some changes:  You have trouble breathing when you are active, or you need to sleep with your head raised on extra pillows to help you breathe.  You have swelling in your legs or abdomen.  You gain 2-3 lb (0.9-1.4 kg) in 24 hours, or 5 lb (2.3 kg) in a week. This amount may be more or less depending on your condition.  You get tired easily.  You have trouble sleeping.  You have a dry cough. If you have any of these symptoms:  Contact your health care provider within the next day.  Your health care provider may adjust your medicines.   Green zone These signs mean you are doing well and can continue what you are doing:  You do not have shortness of breath.  You have very little swelling or no new  swelling.  Your weight is stable (no gain or loss).  You have a normal activity level.  You do not have chest pain or any other new symptoms.   Follow these instructions at home:  Take over-the-counter and prescription medicines only as told by your health care provider.  Weigh yourself daily. Your target weight is __________ lb (__________ kg). ? Call your health care  provider if you gain more than __________ lb (__________ kg) in 24 hours, or more than __________ lb (__________ kg) in a week. ? Health care provider name: _____________________________________________________ ? Health care provider phone number: _____________________________________________________  Eat a heart-healthy diet. Work with a diet and nutrition specialist (dietitian) to create an eating plan that is best for you.  Keep all follow-up visits. This is important. Where to find more information  American Heart Association: www.heart.org Summary  A heart failure action plan helps you understand what to do when you have symptoms of heart failure.  Follow the action plan that was created by you and your health care provider.  Get help right away if you have any symptoms in the red zone. This information is not intended to replace advice given to you by your health care provider. Make sure you discuss any questions you have with your health care provider. Document Revised: 06/07/2020 Document Reviewed: 06/07/2020 Elsevier Patient Education  2021 [employer].

## 2021-01-17 NOTE — Chronic Care Management (AMB) (Signed)
Chronic Care Management   CCM RN Visit Note  01/17/2021 Name: Ashley [name] MRN: [id number] DOB: 22-May-1951  Subjective: Ashley [name] is a 70 y.o. year old female who is a primary care patient of [name], MD. The care management team was consulted for assistance with disease management and care coordination needs.    Engaged with patient by telephone for follow up visit in response to provider referral for case management and/or care coordination services.   Consent to Services:  The patient was given information about Chronic Care Management services, agreed to services, and gave verbal consent prior to initiation of services.  Please see initial visit note for detailed documentation.   Patient agreed to services and verbal consent obtained.   Assessment: Review of patient past medical history, allergies, medications, health status, including review of consultants reports, laboratory and other test data, was performed as part of comprehensive evaluation and provision of chronic care management services.   SDOH (Social Determinants of Health) assessments and interventions performed:    CCM Care Plan  Allergies  Allergen Reactions  . Vancomycin Nausea And Vomiting and Palpitations  . Hydrocodone-Acetaminophen Nausea Only    Outpatient Encounter Medications as of 01/17/2021  Medication Sig Note  . acetaminophen (TYLENOL) 325 MG tablet Take 650 mg by mouth every 6 (six) hours as needed.   [name] albuterol (VENTOLIN HFA) 108 (90 Base) MCG/ACT inhaler INHALE 2 PUFFS BY MOUTH INTO THE LUNGS EVERY 3 HOURS AS NEEDED FOR WHEEZING   . allopurinol (ZYLOPRIM) 100 MG tablet TAKE ONE TABLET BY MOUTH ONCE DAILY   . ALPRAZolam (XANAX) 0.5 MG tablet TAKE 1/2 TO 1 TABLET BY MOUTH AT BEDTIME AS NEEDED FOR ANXIETY   . amitriptyline (ELAVIL) 25 MG tablet TAKE ONE TABLET BY MOUTH AT BEDTIME   . atorvastatin (LIPITOR) 40 MG tablet TAKE ONE TABLET BY MOUTH ONCE DAILY   . ENTRESTO 49-51 MG Take  1 tablet by mouth 2 (two) times daily.   [name] escitalopram (LEXAPRO) 10 MG tablet TAKE ONE TABLET BY MOUTH ONCE DAILY   . ferrous sulfate 325 (65 FE) MG tablet Take 325 mg by mouth daily with breakfast.   . fluticasone (FLONASE) 50 MCG/ACT nasal spray Place 1 spray into both nostrils as needed for allergies or rhinitis.   . folic acid (FOLVITE) 1 MG tablet Take 1 mg by mouth daily.   [name] ipratropium-albuterol (DUONEB) 0.5-2.5 (3) MG/3ML SOLN USE 1 VIAL VIA NEBULIZER EVERY 6 HOURS AS NEEDED   . levothyroxine (SYNTHROID) 50 MCG tablet Take 1 tablet by mouth daily.   . magnesium oxide (MAG-OX) 400 (241.3 Mg) MG tablet Take 1 tablet by mouth every Monday, Wednesday, and Friday.   . potassium chloride (KLOR-CON) 10 MEQ tablet Take 10 mEq by mouth every Monday, Wednesday, and Friday. Take 2 tablets every Monday Wednesday Friday   . torsemide (DEMADEX) 10 MG tablet Take 20 mg by mouth every Monday, Wednesday, and Friday. Per patient and provider notes 01/17/2021: Medication changed by Cardiology at [facility]  . TRELEGY ELLIPTA 100-62.5-25 MCG/INH AEPB INHALE 1 PUFF BY MOUTH INTO THE LUNGS EVERY DAY   . warfarin (COUMADIN) 2 MG tablet Take 4 mg by mouth at bedtime. Monday, Wednesday and Friday 3 mg and AOD takes 4 mg.   . [DISCONTINUED] magnesium oxide (MAG-OX) 400 MG tablet Take by mouth.   [name] amLODipine (NORVASC) 2.5 MG tablet Take 2.5 mg by mouth daily.   . colchicine 0.6 MG tablet TAKE TWO TABLETS BY MOUTH  AT FIRST SIGN OF GOUT FLARE FOLLOW BY 1 TABLET DAILY UNTIL GOUT FLARE RESOLVES 01/03/2021: Reports taking only when gout flares up  . cyanocobalamin (,VITAMIN B-12,) 1000 MCG/ML injection Inject 1 mL into the muscle monthly (Patient taking differently: Inject 1,000 mcg into the muscle every 30 (thirty) days.)   . GNP MUCUS ER 600 MG 12 hr tablet Take 600 mg by mouth 2 (two) times daily. 01/03/2021: Reports taking as needed  . magnesium hydroxide (MILK OF MAGNESIA) 400 MG/5ML suspension Take by mouth.   . metFORMIN  (GLUCOPHAGE-XR) 500 MG 24 hr tablet TAKE TWO TABLETS BY MOUTH EVERY MORNING   . omeprazole (PRILOSEC) 40 MG capsule TAKE ONE CAPSULE BY MOUTH ONCE DAILY (Patient taking differently: Take 40 mg by mouth in the morning and at bedtime.) 01/03/2021: Reports taking daily  . senna-docusate (SENOKOT-S) 8.6-50 MG tablet Take by mouth.   . traZODone (DESYREL) 50 MG tablet TAKE 1/2 TO 1 TABLET BY MOUTH EVERY EVENING 1 HOUR BEFORE BEDTIME    No facility-administered encounter medications on file as of 01/17/2021.    Patient Active Problem List   Diagnosis Date Noted  . Sternal wound infection 12/23/2020  . Anticoagulation goal of INR 1.5 to 2.5 12/23/2020  . LVAD (left ventricular assist device) present ([place]) 12/23/2020  . Complication involving left ventricular assist device (LVAD) 12/23/2020  . Hypothyroidism 12/23/2020  . Snoring 07/05/2020  . Pressure injury of skin 01/01/2020  . CKD (chronic kidney disease), stage IIIa 12/31/2019  . Pulmonary edema cardiac cause ([place]) 12/25/2019  . Hyperlipidemia   . Hyperkalemia   . Gouty arthritis of both feet 11/20/2019  . Mitral stenosis with insufficiency   . Hypertensive urgency   . Depression   . Hyperglycemia 09/22/2019  . Presence of permanent cardiac pacemaker   . Acute blood loss anemia   . Chronic kidney disease   . Hypertension   . CHF (congestive heart failure) ([place]) 07/16/2019  . Insomnia 05/07/2019  . Suspected COVID-19 virus infection 03/23/2019  . AVM (arteriovenous malformation) of small bowel, acquired   . Anemia, iron deficiency 11/10/2018  . Rectal polyp   . Benign neoplasm of cecum   . Barrett's esophagus without dysplasia   . Chronic diastolic heart failure ([place]) 07/03/2018  . HTN (hypertension) 07/03/2018  . COPD with emphysema ([place]) 06/28/2018  . B12 deficiency anemia 06/28/2018  . Personal history of colon cancer   . Polyp of sigmoid colon   . Diverticulosis of large intestine without diverticulitis   . 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 Renal artery stenosis ([place]) 10/03/2016  . 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 Tobacco abuse counseling 10/11/2014  . Tobacco abuse 10/11/2014  . Chronic right hip pain 08/26/2014  . Atherosclerosis of native artery of extremity with intermittent claudication ([place]) 06/18/2013  . Preoperative evaluation to rule out surgical contraindication 06/18/2013  . CAD (coronary artery disease) 06/01/2013  . GERD (gastroesophageal reflux disease) 06/01/2013  . Hypercholesterolemia 06/01/2013  . Tubular adenoma of colon 06/01/2013  . Major depressive disorder in remission ([place]) 06/01/2013    Conditions to be addressed/monitored:CHF, COPD and Anemia  Care Plan : Heart Failure/COPD  Updates made by Leona Singleton, RN since 01/17/2021 12:00 AM  Problem: COPD and Heart Failure complications causing hospitalization   Priority: High  Long-Range Goal: Patient will report no COPD or heart failure exacerbations causing hospitalizations within the next 90 days.   Start Date: 01/03/2021  Expected End Date: 07/05/2021  This Visit's Progress: On  track  Priority: High  Current Barriers:  [name] Knowledge deficit related to self care management of CHF and COPD as evidenced by recent hospitalization related to fluid retention and pneumonia.  Discharged from [facility] on 12/30/20.  Patient stating she feels much better.  Appetite has picked back up.  Has attended follow up appointment with Cardiology and fluid medication, Torsemide was reduced to Monday, Wednesday, and Friday.  Weight this morning was 147 pounds, patient feeling increase in weight related to increase in appetite.  Denies any shortness of breath or swelling in extremities.  Continues to endorse cough, that is getting better.  Denies needing to use rescue inhaler or nebulizer since discharge. Case Manager Clinical Goal(s):  . patient will verbalize understanding of Heart Failure and COPD  Action Plan and when to call doctor . patient will take all mediations as prescribed . patient will weigh daily and record (notifying MD of 3 lb weight gain over night or 5 lb in a week) Interventions:  . Collaboration with [name], MD regarding development and update of comprehensive plan of care as evidenced by provider attestation and co-signature . Inter-disciplinary care team collaboration (see longitudinal plan of care) . Discussed with patient when to weigh each morning after emptying bladder and placing battery in LVAD per instructions . Discussed importance of daily weight and advised patient to weigh and record daily . Reviewed role of diuretics in prevention of fluid overload and management of heart failure, discussed dose change and encouraged patient to monitor symptoms and notify provider if increase in fluid retention, weight, or shortness of breath . Encouraged to read education sent on hypothyroidism . Encouraged patient to use incentive spirometer and do coughing and deep breathing exercises . Reviewed medications and encouraged medication compliance . Encouraged patient to obtain home CO2 detectors and to notify this RNCM if she is unable to . Discussed COPD triggers including pollen and allergy season and action plan and when to use inhaler versus nebulizer . Confirmed patient has LVAD dressing supplies . Sending copy of COPD and Heart Failure action plans Patient Goals/Self-Care Activities . Call office if I gain more than 2 pounds in one day or 5 pounds in one week . Monitor weight gain for fluid versus increase in appetite . Weigh myself daily and record in log along with LVAD daily parameters for provider review . Monitor lower extremities and abdomen for swelling . Take medications as prescribed contacting Cardiology if weight gain/fluid with decrease in dose of Torsemide (fluid medication) . Develop a rescue plan and follow if symptoms flare up . Monitor and  Eliminate symptom triggers at home . Obtain CO2 Detectors for home . Use Incentive Spirometer and coughing and deep breathing exercises . Contact provider for increase use of rescue inhaler/nebulizer Follow Up Plan: The care management team will reach out to the patient again over the next 30 business days.     Problem: Recurrent GI Bleeds leading to hospitalizations   Priority: High  Long-Range Goal: Patient will report no recurrent hospitalizations related to GI bleed in the next 90 days   Start Date: 01/03/2021  Expected End Date: 07/05/2021  This Visit's Progress: On track  Priority: High  Current Barriers:  [name] Knowledge deficits related to self health management of anemia or bleeding as evidenced by recent recurrent hospitalizations.  Patient stating she feels good.  No signs and symptoms of GI bleed.  Reports bowel movements are regular, no visible signs of bleeding. Nurse Case Manager Clinical  Goal(s):   patient will take all medications exactly as prescribed and will call provider for medication related questions  patient will not experience hospital admission within the next 90 days related to GI bleeding. Hospital Admissions in last 6 months =6  patient will attend all scheduled medical appointments: LVAD clinic/INR check  Interventions:  . Collaboration with [name], MD regarding development and update of comprehensive plan of care as evidenced by provider attestation and co-signature . Inter-disciplinary care team collaboration (see longitudinal plan of care) . Medications reviewed . Encouraged to continue to increase activity as tolerated . Encouraged sitting before standing and using an assistive device . Encouraged dietary changes to increase dietary intake of iron, Vitamin U23 and folic acid as advised/prescribed . Provided education about signs and symptoms of active bleeding and advised when to call provider or 911 . Signs/symptoms of bleeding reviewed (weakness,  exhaustion, paleness, sleepiness, blood in stool) . Sending education about GI bleeding . Encouraged to contact provider to verify when next INR check needs to be scheduled Patient Goals/Self-Care Activities: . Continue to monitor yourself for low hemoglobin (feeling tired, exhausted, pale, shortness of breath) . Track what makes symptoms worse and what makes them better  . Contact provider for symptoms right away . Pay attention to LVAD alerts . Monitor stools for blood . Contact provider to verify when next INR check is scheduled Follow Up Plan: The care management team will reach out to the patient again over the next 30 business days.       Plan:The care management team will reach out to the patient again over the next 30 business days.  Hubert Azure RN, MSN RN Care Management Coordinator [place] [phone] [email or web address]

## 2021-01-20 ENCOUNTER — Telehealth: Payer: Self-pay

## 2021-01-20 MED ORDER — ALBUTEROL SULFATE HFA 108 (90 BASE) MCG/ACT IN AERS: INHALATION_SPRAY | RESPIRATORY_TRACT | 2 refills | Status: DC

## 2021-01-20 MED ORDER — IPRATROPIUM-ALBUTEROL 0.5-2.5 (3) MG/3ML IN SOLN: RESPIRATORY_TRACT | 1 refills | Status: DC

## 2021-01-20 NOTE — Telephone Encounter (Signed)
Ashley [name] with [facility] called and requested albuterol (VENTOLIN HFA) 108 (90 Base) MCG/ACT inhaler and Nebulizer solution. Please advise

## 2021-01-22 ENCOUNTER — Other Ambulatory Visit: Payer: Self-pay | Admitting: Internal Medicine

## 2021-01-24 ENCOUNTER — Telehealth: Payer: Self-pay

## 2021-01-24 MED ORDER — ALBUTEROL SULFATE HFA 108 (90 BASE) MCG/ACT IN AERS: 1.0000 | INHALATION_SPRAY | Freq: Four times a day (QID) | RESPIRATORY_TRACT | 2 refills | Status: DC | PRN

## 2021-01-24 NOTE — Telephone Encounter (Signed)
Yes,  I Sent to [facility]

## 2021-01-24 NOTE — Telephone Encounter (Signed)
Pt's insurance does not cover the Ventolin inhaler. Is it okay to send it in as [facility]?

## 2021-01-28 ENCOUNTER — Telehealth: Payer: Self-pay | Admitting: Internal Medicine

## 2021-01-28 DIAGNOSIS — E7800 Pure hypercholesterolemia, unspecified: Secondary | ICD-10-CM

## 2021-01-28 MED ORDER — ATORVASTATIN CALCIUM 40 MG PO TABS: 40.0000 mg | ORAL_TABLET | Freq: Every day | ORAL | 1 refills | Status: DC

## 2021-01-28 NOTE — Telephone Encounter (Signed)
Pharmacy called pt needs a refill on atorvastatin (LIPITOR) 40 MG tablet sent to [facility]

## 2021-02-04 ENCOUNTER — Telehealth: Payer: Self-pay | Admitting: Internal Medicine

## 2021-02-04 MED ORDER — METFORMIN HCL ER 500 MG PO TB24: 1000.0000 mg | ORAL_TABLET | Freq: Every morning | ORAL | 1 refills | Status: DC

## 2021-02-04 NOTE — Telephone Encounter (Signed)
[facility] called for refill metFORMIN (GLUCOPHAGE-XR) 500 MG 24 hr tablet would like 90 day

## 2021-02-09 ENCOUNTER — Ambulatory Visit (INDEPENDENT_AMBULATORY_CARE_PROVIDER_SITE_OTHER): Payer: Medicare Other | Admitting: *Deleted

## 2021-02-09 DIAGNOSIS — T462X5A Adverse effect of other antidysrhythmic drugs, initial encounter: Secondary | ICD-10-CM | POA: Diagnosis not present

## 2021-02-09 DIAGNOSIS — Z9889 Other specified postprocedural states: Secondary | ICD-10-CM | POA: Diagnosis not present

## 2021-02-09 DIAGNOSIS — I499 Cardiac arrhythmia, unspecified: Secondary | ICD-10-CM | POA: Diagnosis not present

## 2021-02-09 DIAGNOSIS — R0600 Dyspnea, unspecified: Secondary | ICD-10-CM | POA: Diagnosis not present

## 2021-02-09 DIAGNOSIS — J439 Emphysema, unspecified: Secondary | ICD-10-CM

## 2021-02-09 DIAGNOSIS — I2510 Atherosclerotic heart disease of native coronary artery without angina pectoris: Secondary | ICD-10-CM | POA: Diagnosis not present

## 2021-02-09 DIAGNOSIS — J9601 Acute respiratory failure with hypoxia: Secondary | ICD-10-CM | POA: Diagnosis not present

## 2021-02-09 DIAGNOSIS — I5089 Other heart failure: Secondary | ICD-10-CM | POA: Diagnosis not present

## 2021-02-09 DIAGNOSIS — R062 Wheezing: Secondary | ICD-10-CM | POA: Diagnosis not present

## 2021-02-09 DIAGNOSIS — J849 Interstitial pulmonary disease, unspecified: Secondary | ICD-10-CM | POA: Diagnosis not present

## 2021-02-09 DIAGNOSIS — R058 Other specified cough: Secondary | ICD-10-CM | POA: Diagnosis not present

## 2021-02-09 DIAGNOSIS — N183 Chronic kidney disease, stage 3 unspecified: Secondary | ICD-10-CM | POA: Diagnosis not present

## 2021-02-09 DIAGNOSIS — K5520 Angiodysplasia of colon without hemorrhage: Secondary | ICD-10-CM

## 2021-02-09 DIAGNOSIS — I255 Ischemic cardiomyopathy: Secondary | ICD-10-CM | POA: Diagnosis not present

## 2021-02-09 DIAGNOSIS — Z7901 Long term (current) use of anticoagulants: Secondary | ICD-10-CM | POA: Diagnosis not present

## 2021-02-09 DIAGNOSIS — R093 Abnormal sputum: Secondary | ICD-10-CM | POA: Diagnosis not present

## 2021-02-09 DIAGNOSIS — I252 Old myocardial infarction: Secondary | ICD-10-CM | POA: Diagnosis not present

## 2021-02-09 DIAGNOSIS — I130 Hypertensive heart and chronic kidney disease with heart failure and stage 1 through stage 4 chronic kidney disease, or unspecified chronic kidney disease: Secondary | ICD-10-CM | POA: Diagnosis not present

## 2021-02-09 DIAGNOSIS — K219 Gastro-esophageal reflux disease without esophagitis: Secondary | ICD-10-CM | POA: Diagnosis not present

## 2021-02-09 DIAGNOSIS — E1122 Type 2 diabetes mellitus with diabetic chronic kidney disease: Secondary | ICD-10-CM | POA: Diagnosis not present

## 2021-02-09 DIAGNOSIS — I5023 Acute on chronic systolic (congestive) heart failure: Secondary | ICD-10-CM | POA: Diagnosis not present

## 2021-02-09 DIAGNOSIS — E039 Hypothyroidism, unspecified: Secondary | ICD-10-CM | POA: Diagnosis not present

## 2021-02-09 DIAGNOSIS — J90 Pleural effusion, not elsewhere classified: Secondary | ICD-10-CM | POA: Diagnosis not present

## 2021-02-09 DIAGNOSIS — E785 Hyperlipidemia, unspecified: Secondary | ICD-10-CM | POA: Diagnosis not present

## 2021-02-09 DIAGNOSIS — Z95810 Presence of automatic (implantable) cardiac defibrillator: Secondary | ICD-10-CM | POA: Diagnosis not present

## 2021-02-09 DIAGNOSIS — D509 Iron deficiency anemia, unspecified: Secondary | ICD-10-CM

## 2021-02-09 DIAGNOSIS — E1142 Type 2 diabetes mellitus with diabetic polyneuropathy: Secondary | ICD-10-CM | POA: Diagnosis not present

## 2021-02-09 DIAGNOSIS — Z87891 Personal history of nicotine dependence: Secondary | ICD-10-CM | POA: Diagnosis not present

## 2021-02-09 DIAGNOSIS — Z95811 Presence of heart assist device: Secondary | ICD-10-CM | POA: Diagnosis not present

## 2021-02-09 DIAGNOSIS — Z20822 Contact with and (suspected) exposure to covid-19: Secondary | ICD-10-CM | POA: Diagnosis not present

## 2021-02-09 DIAGNOSIS — I480 Paroxysmal atrial fibrillation: Secondary | ICD-10-CM | POA: Diagnosis not present

## 2021-02-09 DIAGNOSIS — Z743 Need for continuous supervision: Secondary | ICD-10-CM | POA: Diagnosis not present

## 2021-02-09 DIAGNOSIS — Z4502 Encounter for adjustment and management of automatic implantable cardiac defibrillator: Secondary | ICD-10-CM | POA: Diagnosis not present

## 2021-02-09 DIAGNOSIS — R0602 Shortness of breath: Secondary | ICD-10-CM | POA: Diagnosis not present

## 2021-02-09 DIAGNOSIS — Z951 Presence of aortocoronary bypass graft: Secondary | ICD-10-CM | POA: Diagnosis not present

## 2021-02-09 DIAGNOSIS — J440 Chronic obstructive pulmonary disease with acute lower respiratory infection: Secondary | ICD-10-CM | POA: Diagnosis not present

## 2021-02-09 DIAGNOSIS — J704 Drug-induced interstitial lung disorders, unspecified: Secondary | ICD-10-CM | POA: Diagnosis not present

## 2021-02-09 DIAGNOSIS — J441 Chronic obstructive pulmonary disease with (acute) exacerbation: Secondary | ICD-10-CM | POA: Diagnosis not present

## 2021-02-09 DIAGNOSIS — I5043 Acute on chronic combined systolic (congestive) and diastolic (congestive) heart failure: Secondary | ICD-10-CM

## 2021-02-09 DIAGNOSIS — R002 Palpitations: Secondary | ICD-10-CM | POA: Diagnosis not present

## 2021-02-09 DIAGNOSIS — T462X1D Poisoning by other antidysrhythmic drugs, accidental (unintentional), subsequent encounter: Secondary | ICD-10-CM | POA: Diagnosis not present

## 2021-02-09 DIAGNOSIS — R001 Bradycardia, unspecified: Secondary | ICD-10-CM | POA: Diagnosis not present

## 2021-02-09 DIAGNOSIS — J449 Chronic obstructive pulmonary disease, unspecified: Secondary | ICD-10-CM | POA: Diagnosis not present

## 2021-02-09 DIAGNOSIS — E1151 Type 2 diabetes mellitus with diabetic peripheral angiopathy without gangrene: Secondary | ICD-10-CM | POA: Diagnosis not present

## 2021-02-09 DIAGNOSIS — I272 Pulmonary hypertension, unspecified: Secondary | ICD-10-CM | POA: Diagnosis not present

## 2021-02-09 NOTE — Patient Instructions (Signed)
Visit Information  PATIENT GOALS: Goals Addressed            This Visit's Progress   . (RNCM) Track and Manage Fluids and Swelling   Not on track    Timeframe:  Long-Range Goal Priority:  High Start Date:  01/03/21                           Expected End Date:   07/05/21                    Follow Up Date 02/14/2021    . Call office if I gain more than 2 pounds in one day or 5 pounds in one week . Monitor weight gain for fluid versus increase in appetite . Weigh myself daily and record in log along with LVAD daily parameters for provider review . Monitor lower extremities and abdomen for swelling . Take medications as prescribed contacting Cardiology if weight gain/fluid with decrease in dose of Torsemide (fluid medication) . Call LVAD team as soon as possible with increase in shortness of breath  Why is this important?   It is important to check your weight daily and watch how much salt and liquids you have.  It will help you to manage your heart failure.    Notes:     [name] [facility]) Track and Manage My Symptoms-COPD   Not on track    Timeframe:  Long-Range Goal Priority:  High Start Date:  01/03/21                           Expected End Date:   07/05/21                    Follow Up Date 02/14/21    . Develop a rescue plan and follow if symptoms flare up . Monitor and Eliminate symptom triggers at home . Obtain CO2 Detectors for home . Use Incentive Spirometer and coughing and deep breathing exercises . Contact provider for increase use of rescue inhaler/nebulizer . Call LVAD team for increase in shortness of breath now/as soon as possbile   Why is this important?    Tracking your symptoms and other information about your health helps your doctor plan your care.   Write down the symptoms, the time of day, what you were doing and what medicine you are taking.   You will soon learn how to manage your symptoms.     Notes:     [name] (RNCM) Track My Symptoms-GI Bleed   Not on track     Timeframe:  Long-Range Goal Priority:  High Start Date: 01/03/21                            Expected End Date: 07/05/21                      Follow Up Date 02/14/21   . Continue to monitor yourself for low hemoglobin (feeling tired, exhausted, pale, shortness of breath) . Track what makes symptoms worse and what makes them better  . Contact provider for symptoms right away . Pay attention to LVAD alerts . Monitor stools for blood . Contact [employer] at [phone] to verify settings on home INR machine as soon as possible . Obtain home INR and call to Cardiology office for medication adjustment   Why  is this important?    Keeping track of symptoms that you have helps you to understand your condition. You will also learn what works to manage it.   You and your doctor will then be able to come up with the best treatment plan for you.    Notes:        Patient verbalizes understanding of instructions provided today and agrees to view in [place].   The care management team will reach out to the patient again over the next 10 business days.   Hubert Azure RN, MSN RN Care Management Coordinator [place] [phone] [email or web address]

## 2021-02-09 NOTE — Chronic Care Management (AMB) (Signed)
Chronic Care Management   CCM RN Visit Note  02/09/2021 Name: Ashley [name] MRN: [id number] DOB: 06/18/1951  Subjective: Ashley [name] is a 70 y.o. year old female who is a primary care patient of [name], MD. The care management team was consulted for assistance with disease management and care coordination needs.    Engaged with patient by telephone for follow up visit in response to provider referral for case management and/or care coordination services.   Consent to Services:  The patient was given information about Chronic Care Management services, agreed to services, and gave verbal consent prior to initiation of services.  Please see initial visit note for detailed documentation.   Patient agreed to services and verbal consent obtained.   Assessment: Review of patient past medical history, allergies, medications, health status, including review of consultants reports, laboratory and other test data, was performed as part of comprehensive evaluation and provision of chronic care management services.   SDOH (Social Determinants of Health) assessments and interventions performed:    CCM Care Plan  Allergies  Allergen Reactions  . Vancomycin Nausea And Vomiting and Palpitations  . Hydrocodone-Acetaminophen Nausea Only    Outpatient Encounter Medications as of 02/09/2021  Medication Sig Note  . acetaminophen (TYLENOL) 325 MG tablet Take 650 mg by mouth every 6 (six) hours as needed.   [name] albuterol (PROAIR HFA) 108 (90 Base) MCG/ACT inhaler Inhale 1-2 puffs into the lungs every 6 (six) hours as needed for wheezing or shortness of breath.   . allopurinol (ZYLOPRIM) 100 MG tablet TAKE ONE TABLET BY MOUTH ONCE DAILY   . ALPRAZolam (XANAX) 0.5 MG tablet TAKE 1/2 TO 1 TABLET BY MOUTH AT BEDTIME AS NEEDED FOR ANXIETY   . amitriptyline (ELAVIL) 25 MG tablet TAKE ONE TABLET BY MOUTH AT BEDTIME   . amLODipine (NORVASC) 2.5 MG tablet Take 2.5 mg by mouth daily.   [name] atorvastatin  (LIPITOR) 40 MG tablet Take 1 tablet (40 mg total) by mouth daily.   . colchicine 0.6 MG tablet TAKE TWO TABLETS BY MOUTH AT FIRST SIGN OF GOUT FLARE FOLLOW BY 1 TABLET DAILY UNTIL GOUT FLARE RESOLVES 01/03/2021: Reports taking only when gout flares up  . cyanocobalamin (,VITAMIN B-12,) 1000 MCG/ML injection Inject 1 mL into the muscle monthly (Patient taking differently: Inject 1,000 mcg into the muscle every 30 (thirty) days.)   . ENTRESTO 49-51 MG Take 1 tablet by mouth 2 (two) times daily.   [name] escitalopram (LEXAPRO) 10 MG tablet TAKE ONE TABLET BY MOUTH ONCE DAILY   . ferrous sulfate 325 (65 FE) MG tablet Take 325 mg by mouth daily with breakfast.   . fluticasone (FLONASE) 50 MCG/ACT nasal spray Place 1 spray into both nostrils as needed for allergies or rhinitis.   . folic acid (FOLVITE) 1 MG tablet Take 1 mg by mouth daily.   . GNP MUCUS ER 600 MG 12 hr tablet Take 600 mg by mouth 2 (two) times daily. 01/03/2021: Reports taking as needed  . ipratropium-albuterol (DUONEB) 0.5-2.5 (3) MG/3ML SOLN USE 1 VIAL VIA NEBULIZER EVERY 6 HOURS AS NEEDED   . levothyroxine (SYNTHROID) 50 MCG tablet Take 1 tablet by mouth daily.   . magnesium hydroxide (MILK OF MAGNESIA) 400 MG/5ML suspension Take by mouth.   . magnesium oxide (MAG-OX) 400 (241.3 Mg) MG tablet Take 1 tablet by mouth every Monday, Wednesday, and Friday.   . metFORMIN (GLUCOPHAGE-XR) 500 MG 24 hr tablet Take 2 tablets (1,000 mg total) by mouth  every morning.   [name] omeprazole (PRILOSEC) 40 MG capsule TAKE ONE CAPSULE BY MOUTH ONCE DAILY (Patient taking differently: Take 40 mg by mouth in the morning and at bedtime.) 01/03/2021: Reports taking daily  . potassium chloride (KLOR-CON) 10 MEQ tablet Take 10 mEq by mouth every Monday, Wednesday, and Friday. Take 2 tablets every Monday Wednesday Friday   . senna-docusate (SENOKOT-S) 8.6-50 MG tablet Take by mouth.   . torsemide (DEMADEX) 10 MG tablet Take 20 mg by mouth every Monday, Wednesday, and  Friday. Per patient and provider notes 01/17/2021: Medication changed by Cardiology at [facility]  . traZODone (DESYREL) 50 MG tablet TAKE 1/2 TO 1 TABLET BY MOUTH EVERY EVENING 1 HOUR BEFORE BEDTIME   . TRELEGY ELLIPTA 100-62.5-25 MCG/INH AEPB INHALE 1 PUFF BY MOUTH INTO THE LUNGS EVERY DAY   . warfarin (COUMADIN) 2 MG tablet Take 4 mg by mouth at bedtime. Monday, Wednesday and Friday 3 mg and AOD takes 4 mg.    No facility-administered encounter medications on file as of 02/09/2021.    Patient Active Problem List   Diagnosis Date Noted  . Sternal wound infection 12/23/2020  . Anticoagulation goal of INR 1.5 to 2.5 12/23/2020  . LVAD (left ventricular assist device) present ([place]) 12/23/2020  . Complication involving left ventricular assist device (LVAD) 12/23/2020  . Hypothyroidism 12/23/2020  . Snoring 07/05/2020  . Pressure injury of skin 01/01/2020  . CKD (chronic kidney disease), stage IIIa 12/31/2019  . Pulmonary edema cardiac cause ([place]) 12/25/2019  . Hyperlipidemia   . Hyperkalemia   . Gouty arthritis of both feet 11/20/2019  . Mitral stenosis with insufficiency   . Hypertensive urgency   . Depression   . Hyperglycemia 09/22/2019  . Presence of permanent cardiac pacemaker   . Acute blood loss anemia   . Chronic kidney disease   . Hypertension   . CHF (congestive heart failure) ([place]) 07/16/2019  . Insomnia 05/07/2019  . Suspected COVID-19 virus infection 03/23/2019  . AVM (arteriovenous malformation) of small bowel, acquired   . Anemia, iron deficiency 11/10/2018  . Rectal polyp   . Benign neoplasm of cecum   . Barrett's esophagus without dysplasia   . Chronic diastolic heart failure ([place]) 07/03/2018  . HTN (hypertension) 07/03/2018  . COPD with emphysema ([place]) 06/28/2018  . B12 deficiency anemia 06/28/2018  . Personal history of colon cancer   . Polyp of sigmoid colon   . Diverticulosis of large intestine without diverticulitis   . 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  Renal artery stenosis ([place]) 10/03/2016  . 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 Tobacco abuse counseling 10/11/2014  . Tobacco abuse 10/11/2014  . Chronic right hip pain 08/26/2014  . Atherosclerosis of native artery of extremity with intermittent claudication ([place]) 06/18/2013  . Preoperative evaluation to rule out surgical contraindication 06/18/2013  . CAD (coronary artery disease) 06/01/2013  . GERD (gastroesophageal reflux disease) 06/01/2013  . Hypercholesterolemia 06/01/2013  . Tubular adenoma of colon 06/01/2013  . Major depressive disorder in remission ([place]) 06/01/2013    Conditions to be addressed/monitored:CHF, COPD and Anemia  Care Plan : Heart Failure/COPD  Updates made by Leona Singleton, RN since 02/09/2021 12:00 AM  Problem: COPD and Heart Failure complications causing hospitalization   Priority: High  Long-Range Goal: Patient will report no COPD or heart failure exacerbations causing hospitalizations within the next 90 days.   Start Date: 01/03/2021  Expected End Date: 07/05/2021  This Visit's Progress: Not on track  Recent Progress:  On track  Priority: High  Current Barriers:  [name] Knowledge deficit related to self care management of CHF and COPD as evidenced by recent hospitalization related to fluid retention and pneumonia.  Patient reporting she does not feel well.  States she has been having increasing shortness of breath for the past 3 days, today is worse.  Reports shortness of breath with exertion and inability to lie flat; also reports cough with yellow sputum at times.  Denies any significant edema or weight gain; weight this morning was 150.8 pounds (normal has been 147-150).  Reports compliance with daily maintenance inhaler and taking fluid medication (Demadex) as prescribed Monday, Wednesday, Friday (actually more like daily) since shortness of breath started per patient.  Does report increase in need of rescue inhaler/nebulizer  to about aver 3 hours.  Did not speak with patient long as she was instructed to contact LVAD team for further direction/instructions for shortness of breath. Case Manager Clinical Goal(s):  . patient will verbalize understanding of Heart Failure and COPD Action Plan and when to call doctor . patient will take all mediations as prescribed . patient will weigh daily and record (notifying MD of 3 lb weight gain over night or 5 lb in a week) Interventions:  . Collaboration with [name], MD regarding development and update of comprehensive plan of care as evidenced by provider attestation and co-signature . Inter-disciplinary care team collaboration (see longitudinal plan of care) . Discussed with patient when to weigh each morning after emptying bladder and placing battery in LVAD per instructions . Discussed importance of daily weight and advised patient to weigh and record daily . Reviewed role of diuretics in prevention of fluid overload and management of heart failure, discussed dose change and encouraged patient to monitor symptoms and notify provider if increase in fluid retention, weight, or shortness of breath . Encouraged to read education sent on hypothyroidism . Encouraged patient to use incentive spirometer and do coughing and deep breathing exercises . Reviewed medications and encouraged medication compliance . Encouraged patient to obtain home CO2 detectors and to notify this RNCM if she is unable to . Discussed COPD triggers including pollen and allergy season and action plan and when to use inhaler versus nebulizer . Confirmed patient has LVAD dressing supplies . Sending copy of COPD and Heart Failure action plans . Patient instructed to contact her LVAD team for instructions on her increasing shortness of breath (offered to contact team for her, patient declined stating she would call them) Patient Goals/Self-Care Activities . Call office if I gain more than 2 pounds in one  day or 5 pounds in one week . Monitor weight gain for fluid versus increase in appetite . Weigh myself daily and record in log along with LVAD daily parameters for provider review . Monitor lower extremities and abdomen for swelling . Take medications as prescribed contacting Cardiology if weight gain/fluid with decrease in dose of Torsemide (fluid medication) . Call LVAD team as soon as possible with increase in shortness of breath . Develop a rescue plan and follow if symptoms flare up . Monitor and Eliminate symptom triggers at home . Obtain CO2 Detectors for home . Use Incentive Spirometer and coughing and deep breathing exercises . Contact provider for increase use of rescue inhaler/nebulizer . Call LVAD team for increase in shortness of breath now/as soon as possbile Follow Up Plan: The care management team will reach out to the patient again over the next 10 business days.  Problem: Recurrent GI Bleeds leading to hospitalizations   Priority: High  Long-Range Goal: Patient will report no recurrent hospitalizations related to GI bleed in the next 90 days   Start Date: 01/03/2021  Expected End Date: 07/05/2021  This Visit's Progress: Not on track  Recent Progress: On track  Priority: High  Current Barriers:  [name] Knowledge deficits related to self health management of anemia or bleeding as evidenced by recent recurrent hospitalizations.  Patient stating she does not feel good.  Reports increase in shortness of breath, decrease in activity tolerance, and unable to lie down.  Did not speak with patient long and she was instructed to contact her LVAD team for instructions related to her increasing shortness of breath.  Did note in chart, INR has not been checked recently. Nurse Case Manager Clinical Goal(s):   patient will take all medications exactly as prescribed and will call provider for medication related questions  patient will not experience hospital admission within the next 90  days related to GI bleeding. Hospital Admissions in last 6 months =6  patient will attend all scheduled medical appointments: LVAD clinic/INR check  Interventions:  . Collaboration with [name], MD regarding development and update of comprehensive plan of care as evidenced by provider attestation and co-signature . Inter-disciplinary care team collaboration (see longitudinal plan of care) . Medications reviewed . Encouraged to continue to increase activity as tolerated . Encouraged sitting before standing and using an assistive device . Encouraged dietary changes to increase dietary intake of iron, Vitamin E07 and folic acid as advised/prescribed . Provided education about signs and symptoms of active bleeding and advised when to call provider or 911 . Signs/symptoms of bleeding reviewed (weakness, exhaustion, paleness, sleepiness, blood in stool) . Sending education about GI bleeding . Encouraged to contact provider to verify when next INR check needs to be scheduled . Patient instructed to contact her LVAD team for instructions on her increasing shortness of breath (offered to contact team for her, patient declined stating she would call them) Patient Goals/Self-Care Activities: . Continue to monitor yourself for low hemoglobin (feeling tired, exhausted, pale, shortness of breath) . Track what makes symptoms worse and what makes them better  . Contact provider for symptoms right away . Pay attention to LVAD alerts . Monitor stools for blood . Contact [employer] at [phone] to verify settings on home INR machine as soon as possible . Obtain home INR and call to Cardiology office for medication adjustment Follow Up Plan: The care management team will reach out to the patient again over the next 10 business days.       Plan:The care management team will reach out to the patient again over the next 10 business days.  Hubert Azure RN, MSN RN Care Management Coordinator [place] [phone] [email or web address]

## 2021-02-10 DIAGNOSIS — R002 Palpitations: Secondary | ICD-10-CM | POA: Insufficient documentation

## 2021-02-10 DIAGNOSIS — I480 Paroxysmal atrial fibrillation: Secondary | ICD-10-CM | POA: Insufficient documentation

## 2021-02-14 ENCOUNTER — Ambulatory Visit: Payer: Medicare Other | Admitting: *Deleted

## 2021-02-14 DIAGNOSIS — D62 Acute posthemorrhagic anemia: Secondary | ICD-10-CM | POA: Diagnosis not present

## 2021-02-14 DIAGNOSIS — I5043 Acute on chronic combined systolic (congestive) and diastolic (congestive) heart failure: Secondary | ICD-10-CM | POA: Diagnosis not present

## 2021-02-14 DIAGNOSIS — K5520 Angiodysplasia of colon without hemorrhage: Secondary | ICD-10-CM

## 2021-02-14 DIAGNOSIS — E7800 Pure hypercholesterolemia, unspecified: Secondary | ICD-10-CM | POA: Diagnosis not present

## 2021-02-14 DIAGNOSIS — D509 Iron deficiency anemia, unspecified: Secondary | ICD-10-CM | POA: Diagnosis not present

## 2021-02-14 DIAGNOSIS — J439 Emphysema, unspecified: Secondary | ICD-10-CM

## 2021-02-14 DIAGNOSIS — F325 Major depressive disorder, single episode, in full remission: Secondary | ICD-10-CM | POA: Diagnosis not present

## 2021-02-14 DIAGNOSIS — E1142 Type 2 diabetes mellitus with diabetic polyneuropathy: Secondary | ICD-10-CM | POA: Diagnosis not present

## 2021-02-14 DIAGNOSIS — I5032 Chronic diastolic (congestive) heart failure: Secondary | ICD-10-CM | POA: Diagnosis not present

## 2021-02-14 DIAGNOSIS — I25700 Atherosclerosis of coronary artery bypass graft(s), unspecified, with unstable angina pectoris: Secondary | ICD-10-CM | POA: Diagnosis not present

## 2021-02-14 NOTE — Patient Instructions (Addendum)
Visit Information  PATIENT GOALS: Goals Addressed            This Visit's Progress   . (RNCM) Track and Manage Fluids and Swelling   Not on track    Timeframe:  Long-Range Goal Priority:  High Start Date:  01/03/21                           Expected End Date:   07/05/21                    Follow Up Date 02/23/2021    . Call office if I gain more than 2 pounds in one day or 5 pounds in one week . Weigh myself daily and record in log along with LVAD daily parameters for provider review . Monitor lower extremities and abdomen for swelling . Take torsemide daily as prescribed . Call LVAD team as soon as possible with increase in shortness of breath, do not wait days later  Why is this important?   It is important to check your weight daily and watch how much salt and liquids you have.  It will help you to manage your heart failure.    Notes:     [name] [facility]) Track and Manage My Symptoms-COPD   Not on track    Timeframe:  Long-Range Goal Priority:  High Start Date:  01/03/21                           Expected End Date:   07/05/21                    Follow Up Date 02/23/21    . Develop a rescue plan and follow if symptoms flare up . Monitor and Eliminate symptom triggers at home . Obtain CO2 Detectors for home . Use Incentive Spirometer and flutter valve and coughing and deep breathing exercises . Contact provider for increase use of rescue inhaler/nebulizer (use rescue inhaler and nebulizer for increase in shortness of breath) . Call LVAD team for increase in shortness of breath on day 2 instead of waiting days later when it is worse   Why is this important?    Tracking your symptoms and other information about your health helps your doctor plan your care.   Write down the symptoms, the time of day, what you were doing and what medicine you are taking.   You will soon learn how to manage your symptoms.     Notes:     [name] (RNCM) Track My Symptoms-GI Bleed   Not on track     Timeframe:  Long-Range Goal Priority:  High Start Date: 01/03/21                            Expected End Date: 07/05/21                      Follow Up Date 02/23/21   . Continue to monitor yourself for low hemoglobin (feeling tired, exhausted, pale, shortness of breath) . Track what makes symptoms worse and what makes them better  . Contact provider for symptoms right away . Pay attention to LVAD alerts . Monitor stools for blood . Contact [employer] at [phone] to verify settings on home INR machine as soon as possible . Obtain home INR and call to Cardiology office for  medication adjustment weekly as ordered   Why is this important?    Keeping track of symptoms that you have helps you to understand your condition. You will also learn what works to manage it.   You and your doctor will then be able to come up with the best treatment plan for you.    Notes:        Patient verbalizes understanding of instructions provided today and agrees to view in [place].   The care management team will reach out to the patient again over the next 10 business days.   Hubert Azure RN, MSN RN Care Management Coordinator [place] [phone] [email or web address]     Heart Failure Action Plan A heart failure action plan helps you understand what to do when you have symptoms of heart failure. Your action plan is a color-coded plan that lists the symptoms to watch for and indicates what actions to take.  If you have symptoms in the red zone, you need medical care right away.  If you have symptoms in the yellow zone, you are having problems.  If you have symptoms in the green zone, you are doing well. Follow the plan that was created by you and your health care provider. Review your plan each time you visit your health care provider. Red zone These signs and symptoms mean you should get medical help right away:  You have trouble breathing when  resting.  You have a dry cough that is getting worse.  You have swelling or pain in your legs or abdomen that is getting worse.  You suddenly gain more than 2-3 lb (0.9-1.4 kg) in 24 hours, or more than 5 lb (2.3 kg) in a week. This amount may be more or less depending on your condition.  You have trouble staying awake or you feel confused.  You have chest pain.  You do not have an appetite.  You pass out.  You have worsening sadness or depression. If you have any of these symptoms, call your local emergency services (911 in the [place] right away. Do not drive yourself to the hospital.   Yellow zone These signs and symptoms mean your condition may be getting worse and you should make some changes:  You have trouble breathing when you are active, or you need to sleep with your head raised on extra pillows to help you breathe.  You have swelling in your legs or abdomen.  You gain 2-3 lb (0.9-1.4 kg) in 24 hours, or 5 lb (2.3 kg) in a week. This amount may be more or less depending on your condition.  You get tired easily.  You have trouble sleeping.  You have a dry cough. If you have any of these symptoms:  Contact your health care provider within the next day.  Your health care provider may adjust your medicines.   Green zone These signs mean you are doing well and can continue what you are doing:  You do not have shortness of breath.  You have very little swelling or no new swelling.  Your weight is stable (no gain or loss).  You have a normal activity level.  You do not have chest pain or any other new symptoms.   Follow these instructions at home:  Take over-the-counter and prescription medicines only as told by your health care provider.  Weigh yourself daily. Your target weight is __________ lb (__________ kg). ? Call your health care provider if you gain more than __________ lb (  __________ kg) in 24 hours, or more than __________ lb (__________ kg) in a  week. ? Health care provider name: _____________________________________________________ ? Health care provider phone number: _____________________________________________________  Eat a heart-healthy diet. Work with a diet and nutrition specialist (dietitian) to create an eating plan that is best for you.  Keep all follow-up visits. This is important. Where to find more information  American Heart Association: www.heart.org Summary  A heart failure action plan helps you understand what to do when you have symptoms of heart failure.  Follow the action plan that was created by you and your health care provider.  Get help right away if you have any symptoms in the red zone. This information is not intended to replace advice given to you by your health care provider. Make sure you discuss any questions you have with your health care provider. Document Revised: 06/07/2020 Document Reviewed: 06/07/2020 Elsevier Patient Education  2021 [employer].  COPD Action Plan A COPD action plan is a description of what to do when you have a flare (exacerbation) of chronic obstructive pulmonary disease (COPD). Your action plan is a color-coded plan that lists the symptoms that indicate whether your condition is under control and what actions to take.  If you have symptoms in the green zone, it means you are doing well that day.  If you have symptoms in the yellow zone, it means you are having a bad day or an exacerbation.  If you have symptoms in the red zone, you need urgent medical care. Follow the plan that you and your health care provider developed. Review your plan with your health care provider at each visit. Red zone Symptoms in this zone mean that you should get medical help right away. They include:  Feeling very short of breath, even when you are resting.  Not being able to do any activities because of poor breathing.  Not being able to sleep because of poor breathing.  Fever or  shaking chills.  Feeling confused or very sleepy.  Chest pain.  Coughing up blood. If you have any of these symptoms, call emergency services (911 in the [place] or go to the nearest emergency room.   Yellow zone Symptoms in this zone mean that your condition may be getting worse. They include:  Feeling more short of breath than usual.  Having less energy for daily activities than usual.  Phlegm or mucus that is thicker than usual.  Needing to use your rescue inhaler or nebulizer more often than usual.  More ankle swelling than usual.  Coughing more than usual.  Feeling like you have a chest cold.  Trouble sleeping due to COPD symptoms.  Decreased appetite.  COPD medicines not helping as much as usual. If you experience any "yellow" symptoms:  Keep taking your daily medicines as directed.  Use your quick-relief inhaler as told by your health care provider.  If you were prescribed steroid medicine to take by mouth (oral medicine), start taking it as told by your health care provider.  If you were prescribed an antibiotic medicine, start taking it as told by your health care provider. Do not stop taking the antibiotic even if you start to feel better.  Use oxygen as told by your health care provider.  Get more rest.  Do your pursed-lip breathing exercises.  Do not smoke. Avoid any irritants in the air. If your signs and symptoms do not improve after taking these steps, call your health care provider right away.  Green zone Symptoms in this zone mean that you are doing well. They include:  Being able to do your usual activities and exercise.  Having the usual amount of coughing, including the same amount of phlegm or mucus.  Being able to sleep well.  Having a good appetite.   Where to find more information: You can find more information about COPD from:  American Lung Association, My COPD Action Plan: www.lung.org  COPD Foundation:  www.copdfoundation.[place]: https://wil[place].com/ Follow these instructions at home:  Continue taking your daily medicines as told by your health care provider.  Make sure you receive all the immunizations that your health care provider recommends, especially the pneumococcal and influenza vaccines.  Wash your hands often with soap and water. Have family members wash their hands too. Regular hand washing can help prevent infections.  Follow your usual exercise and diet plan.  Avoid irritants in the air, such as smoke.  Do not use any products that contain nicotine or tobacco. These products include cigarettes, chewing tobacco, and vaping devices, such as e-cigarettes. If you need help quitting, ask your health care provider. Summary  A COPD action plan tells you what to do when you have a flare (exacerbation) of chronic obstructive pulmonary disease (COPD).  Follow each action plan for your symptoms. If you have any symptoms in the red zone, call emergency services (911 in the [place] or go to the nearest emergency room. This information is not intended to replace advice given to you by your health care provider. Make sure you discuss any questions you have with your health care provider. Document Revised: 08/31/2020 Document Reviewed: 08/31/2020 Elsevier Patient Education  2021 [employer].

## 2021-02-14 NOTE — Chronic Care Management (AMB) (Signed)
Chronic Care Management   CCM RN Visit Note  02/14/2021 Name: Ashley [name] MRN: [id number] DOB: 08-Mar-1951  Subjective: Ashley [name] is a 69 y.o. year old female who is a primary care patient of [name], MD. The care management team was consulted for assistance with disease management and care coordination needs.    Engaged with patient by telephone for follow up visit in response to provider referral for case management and/or care coordination services.   Consent to Services:  The patient was given information about Chronic Care Management services, agreed to services, and gave verbal consent prior to initiation of services.  Please see initial visit note for detailed documentation.   Patient agreed to services and verbal consent obtained.   Assessment: Review of patient past medical history, allergies, medications, health status, including review of consultants reports, laboratory and other test data, was performed as part of comprehensive evaluation and provision of chronic care management services.   SDOH (Social Determinants of Health) assessments and interventions performed:    CCM Care Plan  Allergies  Allergen Reactions  . Vancomycin Nausea And Vomiting and Palpitations  . Hydrocodone-Acetaminophen Nausea Only    Outpatient Encounter Medications as of 02/14/2021  Medication Sig Note  . GNP MUCUS ER 600 MG 12 hr tablet Take 600 mg by mouth 2 (two) times daily. 01/03/2021: Reports taking as needed  . torsemide (DEMADEX) 10 MG tablet Take 20 mg by mouth every Monday, Wednesday, and Friday. Per patient and provider notes 02/14/2021: Reports taking daily per discharge instructions on 02/13/21  . TRELEGY ELLIPTA 100-62.5-25 MCG/INH AEPB INHALE 1 PUFF BY MOUTH INTO THE LUNGS EVERY DAY   . acetaminophen (TYLENOL) 325 MG tablet Take 650 mg by mouth every 6 (six) hours as needed.   [name] albuterol (PROAIR HFA) 108 (90 Base) MCG/ACT inhaler Inhale 1-2 puffs into the lungs  every 6 (six) hours as needed for wheezing or shortness of breath.   . allopurinol (ZYLOPRIM) 100 MG tablet TAKE ONE TABLET BY MOUTH ONCE DAILY   . ALPRAZolam (XANAX) 0.5 MG tablet TAKE 1/2 TO 1 TABLET BY MOUTH AT BEDTIME AS NEEDED FOR ANXIETY   . amitriptyline (ELAVIL) 25 MG tablet TAKE ONE TABLET BY MOUTH AT BEDTIME   . amLODipine (NORVASC) 2.5 MG tablet Take 2.5 mg by mouth daily.   [name] atorvastatin (LIPITOR) 40 MG tablet Take 1 tablet (40 mg total) by mouth daily.   . colchicine 0.6 MG tablet TAKE TWO TABLETS BY MOUTH AT FIRST SIGN OF GOUT FLARE FOLLOW BY 1 TABLET DAILY UNTIL GOUT FLARE RESOLVES 01/03/2021: Reports taking only when gout flares up  . cyanocobalamin (,VITAMIN B-12,) 1000 MCG/ML injection Inject 1 mL into the muscle monthly (Patient taking differently: Inject 1,000 mcg into the muscle every 30 (thirty) days.)   . ENTRESTO 49-51 MG Take 1 tablet by mouth 2 (two) times daily. 02/14/2021: Increased dose to 97-103 1 tablet twice a day since discharge 02/13/21  . escitalopram (LEXAPRO) 10 MG tablet TAKE ONE TABLET BY MOUTH ONCE DAILY   . ferrous sulfate 325 (65 FE) MG tablet Take 325 mg by mouth daily with breakfast.   . fluticasone (FLONASE) 50 MCG/ACT nasal spray Place 1 spray into both nostrils as needed for allergies or rhinitis.   . folic acid (FOLVITE) 1 MG tablet Take 1 mg by mouth daily.   [name] ipratropium-albuterol (DUONEB) 0.5-2.5 (3) MG/3ML SOLN USE 1 VIAL VIA NEBULIZER EVERY 6 HOURS AS NEEDED   . levothyroxine (SYNTHROID) 50  MCG tablet Take 1 tablet by mouth daily.   . magnesium hydroxide (MILK OF MAGNESIA) 400 MG/5ML suspension Take by mouth.   . magnesium oxide (MAG-OX) 400 (241.3 Mg) MG tablet Take 1 tablet by mouth every Monday, Wednesday, and Friday.   . metFORMIN (GLUCOPHAGE-XR) 500 MG 24 hr tablet Take 2 tablets (1,000 mg total) by mouth every morning.   [name] omeprazole (PRILOSEC) 40 MG capsule TAKE ONE CAPSULE BY MOUTH ONCE DAILY (Patient taking differently: Take 40 mg by  mouth in the morning and at bedtime.) 01/03/2021: Reports taking daily  . potassium chloride (KLOR-CON) 10 MEQ tablet Take 10 mEq by mouth every Monday, Wednesday, and Friday. Take 2 tablets every Monday Wednesday Friday   . senna-docusate (SENOKOT-S) 8.6-50 MG tablet Take by mouth.   . traZODone (DESYREL) 50 MG tablet TAKE 1/2 TO 1 TABLET BY MOUTH EVERY EVENING 1 HOUR BEFORE BEDTIME   . warfarin (COUMADIN) 2 MG tablet Take 4 mg by mouth at bedtime. Monday, Wednesday and Friday 3 mg and AOD takes 4 mg.    No facility-administered encounter medications on file as of 02/14/2021.    Patient Active Problem List   Diagnosis Date Noted  . Sternal wound infection 12/23/2020  . Anticoagulation goal of INR 1.5 to 2.5 12/23/2020  . LVAD (left ventricular assist device) present ([place]) 12/23/2020  . Complication involving left ventricular assist device (LVAD) 12/23/2020  . Hypothyroidism 12/23/2020  . Snoring 07/05/2020  . Pressure injury of skin 01/01/2020  . CKD (chronic kidney disease), stage IIIa 12/31/2019  . Pulmonary edema cardiac cause ([place]) 12/25/2019  . Hyperlipidemia   . Hyperkalemia   . Gouty arthritis of both feet 11/20/2019  . Mitral stenosis with insufficiency   . Hypertensive urgency   . Depression   . Hyperglycemia 09/22/2019  . Presence of permanent cardiac pacemaker   . Acute blood loss anemia   . Chronic kidney disease   . Hypertension   . CHF (congestive heart failure) ([place]) 07/16/2019  . Insomnia 05/07/2019  . Suspected COVID-19 virus infection 03/23/2019  . AVM (arteriovenous malformation) of small bowel, acquired   . Anemia, iron deficiency 11/10/2018  . Rectal polyp   . Benign neoplasm of cecum   . Barrett's esophagus without dysplasia   . Chronic diastolic heart failure ([place]) 07/03/2018  . HTN (hypertension) 07/03/2018  . COPD with emphysema ([place]) 06/28/2018  . B12 deficiency anemia 06/28/2018  . Personal history of colon cancer   . Polyp of sigmoid colon   .  Diverticulosis of large intestine without diverticulitis   . 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 Renal artery stenosis ([place]) 10/03/2016  . 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 Tobacco abuse counseling 10/11/2014  . Tobacco abuse 10/11/2014  . Chronic right hip pain 08/26/2014  . Atherosclerosis of native artery of extremity with intermittent claudication ([place]) 06/18/2013  . Preoperative evaluation to rule out surgical contraindication 06/18/2013  . CAD (coronary artery disease) 06/01/2013  . GERD (gastroesophageal reflux disease) 06/01/2013  . Hypercholesterolemia 06/01/2013  . Tubular adenoma of colon 06/01/2013  . Major depressive disorder in remission ([place]) 06/01/2013    Conditions to be addressed/monitored:CHF, COPD and Anemia  Care Plan : Heart Failure/COPD  Updates made by Leona Singleton, RN since 02/14/2021 12:00 AM  Problem: COPD and Heart Failure complications causing hospitalization   Priority: High  Long-Range Goal: Patient will report no COPD or heart failure exacerbations causing hospitalizations within the next 90 days.   Start Date: 01/03/2021  Expected End Date: 07/05/2021  This Visit's Progress: Not on track  Recent Progress: Not on track  Priority: High  Current Barriers:  [name] Knowledge deficit related to self care management of CHF and COPD as evidenced by recent hospitalization related to fluid retention and pneumonia.  Patient was admitted to hospital for fluid overload and treated also for possible CAP.  Has few more days of oral antibiotics left to complete.  Was diuresed in hospital and is prescribed Torsemide 20 mg daily at home (reviewed dosing instructions with patient).  Discharge on 02/13/21 with discharge weight 150 pounds.  Today's weight is 151 pounds.  States shortness of breath is a lot better, but continues to report productive cough.  Discussed using nebulizer, Mucinex, and incentive spirometry/flutter valve.   Reports she needs to pick up new dose of Entresto from pharmacy Case Manager Clinical Goal(s):  . patient will verbalize understanding of Heart Failure and COPD Action Plan and when to call doctor . patient will take all mediations as prescribed . patient will weigh daily and record (notifying MD of 3 lb weight gain over night or 5 lb in a week) Interventions:  . Collaboration with [name], MD regarding development and update of comprehensive plan of care as evidenced by provider attestation and co-signature . Inter-disciplinary care team collaboration (see longitudinal plan of care) . Discussed with patient when to weigh each morning after emptying bladder and placing battery in LVAD per instructions . Discussed importance of daily weight and advised patient to weigh and record daily; instructed patient to use 2022 Calendar booklet to document weights to keep track of increase for review and provider review (instead of trying to remember from day to day) . Reviewed role of diuretics in prevention of fluid overload and management of heart failure, discussed dose change and encouraged patient to monitor symptoms and notify provider if increase in fluid retention, weight, or shortness of breath . Encouraged patient to use incentive spirometer and flutter valve,and to do coughing and deep breathing exercises . Reviewed medications and encouraged medication compliance . Encouraged patient to obtain home CO2 detectors and to notify this RNCM if she is unable to . Discussed COPD triggers including pollen and allergy season and action plan and when to use inhaler versus nebulizer . Encouraged increase use of nebulizer and rescue inhaler for shortness of breath to see if they help . Confirmed patient has LVAD dressing supplies . Encouraged to review sent copies of COPD and Heart Failure action plans . Encouraged to pick up new medications from pharmacy as soon as possible . Reviewed instructions  for antibiotics and encouraged patient to complete them as instructed . Patient instructed to contact her LVAD team for shortness of breath on day 1 or 2, not to wait until shortness of breath is too severe (day 4) for treatment options or instructions Patient Goals/Self-Care Activities . Develop a rescue plan and follow if symptoms flare up . Monitor and Eliminate symptom triggers at home . Obtain CO2 Detectors for home . Use Incentive Spirometer and flutter valve and coughing and deep breathing exercises . Contact provider for increase use of rescue inhaler/nebulizer (use rescue inhaler and nebulizer for increase in shortness of breath) . Call LVAD team for increase in shortness of breath on day 2 instead of waiting days later when it is worse . Call office if I gain more than 2 pounds in one day or 5 pounds in one week . Weigh myself daily and record in  log along with LVAD daily parameters for provider review . Monitor lower extremities and abdomen for swelling . Take torsemide daily as prescribed . Call LVAD team as soon as possible with increase in shortness of breath, do not wait days later Follow Up Plan: The care management team will reach out to the patient again over the next 10 business days.     Problem: Recurrent GI Bleeds leading to hospitalizations   Priority: High  Long-Range Goal: Patient will report no recurrent hospitalizations related to GI bleed in the next 90 days   Start Date: 01/03/2021  Expected End Date: 07/05/2021  This Visit's Progress: Not on track  Recent Progress: Not on track  Priority: High  Current Barriers:  [name] Knowledge deficits related to self health management of anemia or bleeding as evidenced by recent recurrent hospitalizations.  INR checked while patient hospitalized.  Now back home patient to check INR weekly.  Continues to report error message on INR machine.  Encouraged to contact manufacturer to help trouble shoot machine.  Reviewed signs ans  symptoms of low hemoglobin and encouraged patient to monitor self Nurse Case Manager Clinical Goal(s):   patient will take all medications exactly as prescribed and will call provider for medication related questions  patient will not experience hospital admission within the next 90 days related to GI bleeding. Hospital Admissions in last 6 months =6  patient will attend all scheduled medical appointments: LVAD clinic/INR check  Interventions:  . Collaboration with [name], MD regarding development and update of comprehensive plan of care as evidenced by provider attestation and co-signature . Inter-disciplinary care team collaboration (see longitudinal plan of care) . Medications reviewed . Encouraged to continue to increase activity as tolerated . Encouraged sitting before standing and using an assistive device . Encouraged dietary changes to increase dietary intake of iron, Vitamin D74 and folic acid as advised/prescribed . Provided education about signs and symptoms of active bleeding and advised when to call provider or 911 . Signs/symptoms of bleeding reviewed (weakness, exhaustion, paleness, sleepiness, blood in stool) . Encouraged to contact provider/cardiology with next INR check this week . Patient instructed to contact [facility] about INR machine not working and needing help to get it to work Patient Goals/Self-Care Activities: . Continue to monitor yourself for low hemoglobin (feeling tired, exhausted, pale, shortness of breath) . Track what makes symptoms worse and what makes them better  . Contact provider for symptoms right away . Pay attention to LVAD alerts . Monitor stools for blood . Contact [facility] at [phone] to verify settings on home INR machine as soon as possible . Obtain home INR and call to Cardiology office for medication adjustment weekly as ordered Follow Up Plan: The care management team will reach out to the patient again over the next 10 business  days.       Plan:The care management team will reach out to the patient again over the next 10 business days.  Hubert Azure RN, MSN RN Care Management Coordinator [place] [phone] [email or web address]

## 2021-02-18 DIAGNOSIS — J449 Chronic obstructive pulmonary disease, unspecified: Secondary | ICD-10-CM | POA: Diagnosis not present

## 2021-02-18 DIAGNOSIS — R0602 Shortness of breath: Secondary | ICD-10-CM | POA: Diagnosis not present

## 2021-02-18 DIAGNOSIS — Z7901 Long term (current) use of anticoagulants: Secondary | ICD-10-CM | POA: Diagnosis not present

## 2021-02-18 DIAGNOSIS — I255 Ischemic cardiomyopathy: Secondary | ICD-10-CM | POA: Diagnosis not present

## 2021-02-18 DIAGNOSIS — I5020 Unspecified systolic (congestive) heart failure: Secondary | ICD-10-CM | POA: Diagnosis not present

## 2021-02-18 DIAGNOSIS — I5022 Chronic systolic (congestive) heart failure: Secondary | ICD-10-CM | POA: Diagnosis not present

## 2021-02-18 DIAGNOSIS — Z95811 Presence of heart assist device: Secondary | ICD-10-CM | POA: Diagnosis not present

## 2021-02-18 DIAGNOSIS — T462X1D Poisoning by other antidysrhythmic drugs, accidental (unintentional), subsequent encounter: Secondary | ICD-10-CM | POA: Diagnosis not present

## 2021-02-23 ENCOUNTER — Ambulatory Visit: Payer: Medicare Other | Admitting: *Deleted

## 2021-02-23 DIAGNOSIS — I5043 Acute on chronic combined systolic (congestive) and diastolic (congestive) heart failure: Secondary | ICD-10-CM

## 2021-02-23 DIAGNOSIS — D62 Acute posthemorrhagic anemia: Secondary | ICD-10-CM

## 2021-02-23 DIAGNOSIS — K5520 Angiodysplasia of colon without hemorrhage: Secondary | ICD-10-CM

## 2021-02-23 DIAGNOSIS — I5032 Chronic diastolic (congestive) heart failure: Secondary | ICD-10-CM

## 2021-02-23 NOTE — Patient Instructions (Addendum)
Visit Information  PATIENT GOALS: Goals Addressed            This Visit's Progress   . (RNCM) Track and Manage Fluids and Swelling   On track    Timeframe:  Long-Range Goal Priority:  High Start Date:  01/03/21                           Expected End Date:   07/05/21                    Follow Up Date 03/11/2021    . Call office if I gain more than 2 pounds in one day or 5 pounds in one week . Weigh myself daily and record in log along with LVAD daily parameters for provider review . Monitor lower extremities and abdomen for swelling . Take torsemide daily as prescribed . Call LVAD team as soon as possible with increase in shortness of breath, do not wait days later  Why is this important?   It is important to check your weight daily and watch how much salt and liquids you have.  It will help you to manage your heart failure.    Notes:     [name] (RNCM) Track and Manage My Symptoms-COPD   On track    Timeframe:  Long-Range Goal Priority:  High Start Date:  01/03/21                           Expected End Date:   07/05/21                    Follow Up Date 03/11/21    . Develop a rescue plan and follow if symptoms flare up . Monitor and Eliminate symptom triggers at home . Obtain CO2 Detectors for home (contact local fire department to seek assistance in obtaining) . Use Incentive Spirometer and flutter valve and coughing and deep breathing exercises . Contact provider for increase use of rescue inhaler/nebulizer (use rescue inhaler and nebulizer for increase in shortness of breath) . Call LVAD team for increase in shortness of breath on day 2 instead of waiting days later when it is worse   Why is this important?    Tracking your symptoms and other information about your health helps your doctor plan your care.   Write down the symptoms, the time of day, what you were doing and what medicine you are taking.   You will soon learn how to manage your symptoms.     Notes:     [name]  (RNCM) Track My Symptoms-GI Bleed   On track    Timeframe:  Long-Range Goal Priority:  Medium Start Date: 01/03/21                            Expected End Date: 07/05/21                      Follow Up Date 03/11/21   . Continue to monitor yourself for low hemoglobin (feeling tired, exhausted, pale, shortness of breath) . Track what makes symptoms worse and what makes them better  . Contact provider for symptoms right away . Pay attention to LVAD alerts . Monitor stools for blood . Obtain home INR and call to Cardiology office for medication adjustment weekly as ordered   Why is this  important?    Keeping track of symptoms that you have helps you to understand your condition. You will also learn what works to manage it.   You and your doctor will then be able to come up with the best treatment plan for you.    Notes:        Patient verbalizes understanding of instructions provided today and agrees to view in [place].   The care management team will reach out to the patient again over the next 20 business days.   Hubert Azure RN, MSN RN Care Management Coordinator [place] [phone] [email or web address]     [employer] medicine (25th ed., pp. [phone]). [place], [place]: Elsevier.">  Anemia  Anemia is a condition in which there is not enough red blood cells or hemoglobin in the blood. Hemoglobin is a substance in red blood cells that carries oxygen. When you do not have enough red blood cells or hemoglobin (are anemic), your body cannot get enough oxygen and your organs may not work properly. As a result, you may feel very tired or have other problems. What are the causes? Common causes of anemia include:  Excessive bleeding. Anemia can be caused by excessive bleeding inside or outside the body, including bleeding from the intestines or from heavy menstrual periods in females.  Poor nutrition.  Long-lasting (chronic) kidney,  thyroid, and liver disease.  Bone marrow disorders, spleen problems, and blood disorders.  Cancer and treatments for cancer.  HIV (human immunodeficiency virus) and AIDS (acquired immunodeficiency syndrome).  Infections, medicines, and autoimmune disorders that destroy red blood cells. What are the signs or symptoms? Symptoms of this condition include:  Minor weakness.  Dizziness.  Headache, or difficulties concentrating and sleeping.  Heartbeats that feel irregular or faster than normal (palpitations).  Shortness of breath, especially with exercise.  Pale skin, lips, and nails, or cold hands and feet.  Indigestion and nausea. Symptoms may occur suddenly or develop slowly. If your anemia is mild, you may not have symptoms. How is this diagnosed? This condition is diagnosed based on blood tests, your medical history, and a physical exam. In some cases, a test may be needed in which cells are removed from the soft tissue inside of a bone and looked at under a microscope (bone marrow biopsy). Your health care provider may also check your stool (feces) for blood and may do additional testing to look for the cause of your bleeding. Other tests may include:  Imaging tests, such as a CT scan or MRI.  A procedure to see inside your esophagus and stomach (endoscopy).  A procedure to see inside your colon and rectum (colonoscopy). How is this treated? Treatment for this condition depends on the cause. If you continue to lose a lot of blood, you may need to be treated at a hospital. Treatment may include:  Taking supplements of iron, vitamin O17, or folic acid.  Taking a hormone medicine (erythropoietin) that can help to stimulate red blood cell growth.  Having a blood transfusion. This may be needed if you lose a lot of blood.  Making changes to your diet.  Having surgery to remove your spleen. Follow these instructions at home:  Take over-the-counter and prescription medicines  only as told by your health care provider.  Take supplements only as told by your health care provider.  Follow any diet instructions that you were given by your health care provider.  Keep all follow-up visits as told by your health care provider. This is important.  Contact a health care provider if:  You develop new bleeding anywhere in the body. Get help right away if:  You are very weak.  You are short of breath.  You have pain in your abdomen or chest.  You are dizzy or feel faint.  You have trouble concentrating.  You have bloody stools, black stools, or tarry stools.  You vomit repeatedly or you vomit up blood. These symptoms may represent a serious problem that is an emergency. Do not wait to see if the symptoms will go away. Get medical help right away. Call your local emergency services (911 in the [place] Do not drive yourself to the hospital. Summary  Anemia is a condition in which you do not have enough red blood cells or enough of a substance in your red blood cells that carries oxygen (hemoglobin).  Symptoms may occur suddenly or develop slowly.  If your anemia is mild, you may not have symptoms.  This condition is diagnosed with blood tests, a medical history, and a physical exam. Other tests may be needed.  Treatment for this condition depends on the cause of the anemia. This information is not intended to replace advice given to you by your health care provider. Make sure you discuss any questions you have with your health care provider. Document Revised: 09/30/2019 Document Reviewed: 09/30/2019 Elsevier Patient Education  2021 [place].  Heart Failure Action Plan A heart failure action plan helps you understand what to do when you have symptoms of heart failure. Your action plan is a color-coded plan that lists the symptoms to watch for and indicates what actions to take.  If you have symptoms in the red zone, you need medical care right away.  If  you have symptoms in the yellow zone, you are having problems.  If you have symptoms in the green zone, you are doing well. Follow the plan that was created by you and your health care provider. Review your plan each time you visit your health care provider. Red zone These signs and symptoms mean you should get medical help right away:  You have trouble breathing when resting.  You have a dry cough that is getting worse.  You have swelling or pain in your legs or abdomen that is getting worse.  You suddenly gain more than 2-3 lb (0.9-1.4 kg) in 24 hours, or more than 5 lb (2.3 kg) in a week. This amount may be more or less depending on your condition.  You have trouble staying awake or you feel confused.  You have chest pain.  You do not have an appetite.  You pass out.  You have worsening sadness or depression. If you have any of these symptoms, call your local emergency services (911 in the [place] right away. Do not drive yourself to the hospital.   Yellow zone These signs and symptoms mean your condition may be getting worse and you should make some changes:  You have trouble breathing when you are active, or you need to sleep with your head raised on extra pillows to help you breathe.  You have swelling in your legs or abdomen.  You gain 2-3 lb (0.9-1.4 kg) in 24 hours, or 5 lb (2.3 kg) in a week. This amount may be more or less depending on your condition.  You get tired easily.  You have trouble sleeping.  You have a dry cough. If you have any of these symptoms:  Contact your health care provider within the next  day.  Your health care provider may adjust your medicines.   Green zone These signs mean you are doing well and can continue what you are doing:  You do not have shortness of breath.  You have very little swelling or no new swelling.  Your weight is stable (no gain or loss).  You have a normal activity level.  You do not have chest pain or any other  new symptoms.   Follow these instructions at home:  Take over-the-counter and prescription medicines only as told by your health care provider.  Weigh yourself daily. Your target weight is __________ lb (__________ kg). ? Call your health care provider if you gain more than __________ lb (__________ kg) in 24 hours, or more than __________ lb (__________ kg) in a week. ? Health care provider name: _____________________________________________________ ? Health care provider phone number: _____________________________________________________  Eat a heart-healthy diet. Work with a diet and nutrition specialist (dietitian) to create an eating plan that is best for you.  Keep all follow-up visits. This is important. Where to find more information  American Heart Association: www.heart.org Summary  A heart failure action plan helps you understand what to do when you have symptoms of heart failure.  Follow the action plan that was created by you and your health care provider.  Get help right away if you have any symptoms in the red zone. This information is not intended to replace advice given to you by your health care provider. Make sure you discuss any questions you have with your health care provider. Document Revised: 06/07/2020 Document Reviewed: 06/07/2020 Elsevier Patient Education  2021 [place].  COPD Action Plan A COPD action plan is a description of what to do when you have a flare (exacerbation) of chronic obstructive pulmonary disease (COPD). Your action plan is a color-coded plan that lists the symptoms that indicate whether your condition is under control and what actions to take.  If you have symptoms in the green zone, it means you are doing well that day.  If you have symptoms in the yellow zone, it means you are having a bad day or an exacerbation.  If you have symptoms in the red zone, you need urgent medical care. Follow the plan that you and your health care  provider developed. Review your plan with your health care provider at each visit. Red zone Symptoms in this zone mean that you should get medical help right away. They include:  Feeling very short of breath, even when you are resting.  Not being able to do any activities because of poor breathing.  Not being able to sleep because of poor breathing.  Fever or shaking chills.  Feeling confused or very sleepy.  Chest pain.  Coughing up blood. If you have any of these symptoms, call emergency services (911 in the [place] or go to the nearest emergency room.   Yellow zone Symptoms in this zone mean that your condition may be getting worse. They include:  Feeling more short of breath than usual.  Having less energy for daily activities than usual.  Phlegm or mucus that is thicker than usual.  Needing to use your rescue inhaler or nebulizer more often than usual.  More ankle swelling than usual.  Coughing more than usual.  Feeling like you have a chest cold.  Trouble sleeping due to COPD symptoms.  Decreased appetite.  COPD medicines not helping as much as usual. If you experience any "yellow" symptoms:  Keep taking your  daily medicines as directed.  Use your quick-relief inhaler as told by your health care provider.  If you were prescribed steroid medicine to take by mouth (oral medicine), start taking it as told by your health care provider.  If you were prescribed an antibiotic medicine, start taking it as told by your health care provider. Do not stop taking the antibiotic even if you start to feel better.  Use oxygen as told by your health care provider.  Get more rest.  Do your pursed-lip breathing exercises.  Do not smoke. Avoid any irritants in the air. If your signs and symptoms do not improve after taking these steps, call your health care provider right away.   Green zone Symptoms in this zone mean that you are doing well. They include:  Being able to  do your usual activities and exercise.  Having the usual amount of coughing, including the same amount of phlegm or mucus.  Being able to sleep well.  Having a good appetite.   Where to find more information: You can find more information about COPD from:  American Lung Association, My COPD Action Plan: www.lung.org  COPD Foundation: www.copdfoundation.[place]: https://[place].com/ Follow these instructions at home:  Continue taking your daily medicines as told by your health care provider.  Make sure you receive all the immunizations that your health care provider recommends, especially the pneumococcal and influenza vaccines.  Wash your hands often with soap and water. Have family members wash their hands too. Regular hand washing can help prevent infections.  Follow your usual exercise and diet plan.  Avoid irritants in the air, such as smoke.  Do not use any products that contain nicotine or tobacco. These products include cigarettes, chewing tobacco, and vaping devices, such as e-cigarettes. If you need help quitting, ask your health care provider. Summary  A COPD action plan tells you what to do when you have a flare (exacerbation) of chronic obstructive pulmonary disease (COPD).  Follow each action plan for your symptoms. If you have any symptoms in the red zone, call emergency services (911 in the [place] or go to the nearest emergency room. This information is not intended to replace advice given to you by your health care provider. Make sure you discuss any questions you have with your health care provider. Document Revised: 08/31/2020 Document Reviewed: 08/31/2020 Elsevier Patient Education  2021 [employer].

## 2021-02-23 NOTE — Chronic Care Management (AMB) (Signed)
Chronic Care Management   CCM RN Visit Note  02/23/2021 Name: Ashley [name] MRN: [id number] DOB: Aug 01, 1951  Subjective: Ashley [name] is a 70 y.o. year old female who is a primary care patient of [name], MD. The care management team was consulted for assistance with disease management and care coordination needs.    Engaged with patient by telephone for follow up visit in response to provider referral for case management and/or care coordination services.   Consent to Services:  The patient was given information about Chronic Care Management services, agreed to services, and gave verbal consent prior to initiation of services.  Please see initial visit note for detailed documentation.   Patient agreed to services and verbal consent obtained.   Assessment: Review of patient past medical history, allergies, medications, health status, including review of consultants reports, laboratory and other test data, was performed as part of comprehensive evaluation and provision of chronic care management services.   SDOH (Social Determinants of Health) assessments and interventions performed:    CCM Care Plan  Allergies  Allergen Reactions  . Vancomycin Nausea And Vomiting and Palpitations  . Hydrocodone-Acetaminophen Nausea Only    Outpatient Encounter Medications as of 02/23/2021  Medication Sig Note  . GNP MUCUS ER 600 MG 12 hr tablet Take 600 mg by mouth 2 (two) times daily. 01/03/2021: Reports taking as needed  . ipratropium-albuterol (DUONEB) 0.5-2.5 (3) MG/3ML SOLN USE 1 VIAL VIA NEBULIZER EVERY 6 HOURS AS NEEDED   . metFORMIN (GLUCOPHAGE-XR) 500 MG 24 hr tablet Take 2 tablets (1,000 mg total) by mouth every morning.   . torsemide (DEMADEX) 10 MG tablet Take 20 mg by mouth every Monday, Wednesday, and Friday. Per patient and provider notes 02/14/2021: Reports taking daily per discharge instructions on 02/13/21  . TRELEGY ELLIPTA 100-62.5-25 MCG/INH AEPB INHALE 1 PUFF BY  MOUTH INTO THE LUNGS EVERY DAY   . acetaminophen (TYLENOL) 325 MG tablet Take 650 mg by mouth every 6 (six) hours as needed.   [name] albuterol (PROAIR HFA) 108 (90 Base) MCG/ACT inhaler Inhale 1-2 puffs into the lungs every 6 (six) hours as needed for wheezing or shortness of breath.   . allopurinol (ZYLOPRIM) 100 MG tablet TAKE ONE TABLET BY MOUTH ONCE DAILY   . ALPRAZolam (XANAX) 0.5 MG tablet TAKE 1/2 TO 1 TABLET BY MOUTH AT BEDTIME AS NEEDED FOR ANXIETY   . amitriptyline (ELAVIL) 25 MG tablet TAKE ONE TABLET BY MOUTH AT BEDTIME   . amLODipine (NORVASC) 2.5 MG tablet Take 2.5 mg by mouth daily.   [name] atorvastatin (LIPITOR) 40 MG tablet Take 1 tablet (40 mg total) by mouth daily.   . colchicine 0.6 MG tablet TAKE TWO TABLETS BY MOUTH AT FIRST SIGN OF GOUT FLARE FOLLOW BY 1 TABLET DAILY UNTIL GOUT FLARE RESOLVES 01/03/2021: Reports taking only when gout flares up  . cyanocobalamin (,VITAMIN B-12,) 1000 MCG/ML injection Inject 1 mL into the muscle monthly (Patient taking differently: Inject 1,000 mcg into the muscle every 30 (thirty) days.)   . ENTRESTO 49-51 MG Take 1 tablet by mouth 2 (two) times daily. 02/14/2021: Increased dose to 97-103 1 tablet twice a day since discharge 02/13/21  . escitalopram (LEXAPRO) 10 MG tablet TAKE ONE TABLET BY MOUTH ONCE DAILY   . ferrous sulfate 325 (65 FE) MG tablet Take 325 mg by mouth daily with breakfast.   . fluticasone (FLONASE) 50 MCG/ACT nasal spray Place 1 spray into both nostrils as needed for allergies or rhinitis.   [name]  folic acid (FOLVITE) 1 MG tablet Take 1 mg by mouth daily.   [name] levothyroxine (SYNTHROID) 50 MCG tablet Take 1 tablet by mouth daily.   . magnesium hydroxide (MILK OF MAGNESIA) 400 MG/5ML suspension Take by mouth.   . magnesium oxide (MAG-OX) 400 (241.3 Mg) MG tablet Take 1 tablet by mouth every Monday, Wednesday, and Friday.   [name] omeprazole (PRILOSEC) 40 MG capsule TAKE ONE CAPSULE BY MOUTH ONCE DAILY (Patient taking differently: Take 40 mg by  mouth in the morning and at bedtime.) 01/03/2021: Reports taking daily  . potassium chloride (KLOR-CON) 10 MEQ tablet Take 10 mEq by mouth every Monday, Wednesday, and Friday. Take 2 tablets every Monday Wednesday Friday   . senna-docusate (SENOKOT-S) 8.6-50 MG tablet Take by mouth.   . traZODone (DESYREL) 50 MG tablet TAKE 1/2 TO 1 TABLET BY MOUTH EVERY EVENING 1 HOUR BEFORE BEDTIME   . warfarin (COUMADIN) 2 MG tablet Take 4 mg by mouth at bedtime. Monday, Wednesday and Friday 3 mg and AOD takes 4 mg.    No facility-administered encounter medications on file as of 02/23/2021.    Patient Active Problem List   Diagnosis Date Noted  . Sternal wound infection 12/23/2020  . Anticoagulation goal of INR 1.5 to 2.5 12/23/2020  . LVAD (left ventricular assist device) present ([place]) 12/23/2020  . Complication involving left ventricular assist device (LVAD) 12/23/2020  . Hypothyroidism 12/23/2020  . Snoring 07/05/2020  . Pressure injury of skin 01/01/2020  . CKD (chronic kidney disease), stage IIIa 12/31/2019  . Pulmonary edema cardiac cause ([place]) 12/25/2019  . Hyperlipidemia   . Hyperkalemia   . Gouty arthritis of both feet 11/20/2019  . Mitral stenosis with insufficiency   . Hypertensive urgency   . Depression   . Hyperglycemia 09/22/2019  . Presence of permanent cardiac pacemaker   . Acute blood loss anemia   . Chronic kidney disease   . Hypertension   . CHF (congestive heart failure) ([place]) 07/16/2019  . Insomnia 05/07/2019  . Suspected COVID-19 virus infection 03/23/2019  . AVM (arteriovenous malformation) of small bowel, acquired   . Anemia, iron deficiency 11/10/2018  . Rectal polyp   . Benign neoplasm of cecum   . Barrett's esophagus without dysplasia   . Chronic diastolic heart failure ([place]) 07/03/2018  . HTN (hypertension) 07/03/2018  . COPD with emphysema ([place]) 06/28/2018  . B12 deficiency anemia 06/28/2018  . Personal history of colon cancer   . Polyp of sigmoid colon   .  Diverticulosis of large intestine without diverticulitis   . 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 Renal artery stenosis ([place]) 10/03/2016  . 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 Tobacco abuse counseling 10/11/2014  . Tobacco abuse 10/11/2014  . Chronic right hip pain 08/26/2014  . Atherosclerosis of native artery of extremity with intermittent claudication ([place]) 06/18/2013  . Preoperative evaluation to rule out surgical contraindication 06/18/2013  . CAD (coronary artery disease) 06/01/2013  . GERD (gastroesophageal reflux disease) 06/01/2013  . Hypercholesterolemia 06/01/2013  . Tubular adenoma of colon 06/01/2013  . Major depressive disorder in remission ([place]) 06/01/2013    Conditions to be addressed/monitored:CHF and Anemia  Care Plan : Heart Failure/COPD  Updates made by Leona Singleton, RN since 02/23/2021 12:00 AM  Problem: COPD and Heart Failure complications causing hospitalization   Priority: High  Long-Range Goal: Patient will report no COPD or heart failure exacerbations causing hospitalizations within the next 90 days.   Start Date: 01/03/2021  Expected End Date:  07/05/2021  This Visit's Progress: On track  Recent Progress: Not on track  Priority: High  Current Barriers:  [name] Knowledge deficit related to self care management of CHF and COPD as evidenced by recent hospitalization related to fluid retention and pneumonia.  Reports she continues to do better.  Denies any shortness of breath currently or lower extremity edema.  Weight this morning was 151.4 pounds (150 on discharge).  Does report some mucus and cough, but feels it is related to her allergies; continues to take her Mucinex.  Reports she is using her nebulizer about twice a day and denies the need of having to use her rescue inhaler.  Denies any LVAD alerts.  Reports compliance with Demadex.  Denies any needs or concerns at this time. Case Manager Clinical Goal(s):  . patient  will verbalize understanding of Heart Failure and COPD Action Plan and when to call doctor . patient will take all mediations as prescribed . patient will weigh daily and record (notifying MD of 3 lb weight gain over night or 5 lb in a week) Interventions:  . Collaboration with [name], MD regarding development and update of comprehensive plan of care as evidenced by provider attestation and co-signature . Inter-disciplinary care team collaboration (see longitudinal plan of care) . Discussed with patient when to weigh each morning after emptying bladder and placing battery in LVAD per instructions . Discussed importance of daily weight and advised patient to weigh and record daily; instructed patient to use 2022 Calendar booklet to document weights to keep track of increase for review and provider review (instead of trying to remember from day to day) . Reviewed role of diuretics in prevention of fluid overload and management of heart failure, discussed dose change and encouraged patient to monitor symptoms and notify provider if increase in fluid retention, weight, or shortness of breath . Encouraged patient to use incentive spirometer and flutter valve,and to do coughing and deep breathing exercises . Reviewed medications and encouraged medication compliance . Encouraged patient to obtain home CO2 detectors and to contact local Fire Department to verify any programs supplying families with CO2 detectors, reviewed where patient could purchase from . Discussed COPD triggers including pollen and allergy season and action plan and when to use inhaler versus nebulizer . Encouraged increase use of nebulizer and rescue inhaler for shortness of breath to see if they help . Confirmed patient has LVAD supplies . Resending copies of COPD and Heart Failure action plans . Verified patient completed prescribed antibiotics . Reinforced with patient to contact her LVAD team for shortness of breath on day  1 or 2, not to wait until shortness of breath is too severe (day 4) for treatment options or instructions Patient Goals/Self-Care Activities . Call office if I gain more than 2 pounds in one day or 5 pounds in one week . Weigh myself daily and record in log along with LVAD daily parameters for provider review . Monitor lower extremities and abdomen for swelling . Take torsemide daily as prescribed . Call LVAD team as soon as possible with increase in shortness of breath, do not wait days later . Develop a rescue plan and follow if symptoms flare up . Monitor and Eliminate symptom triggers at home . Obtain CO2 Detectors for home (contact local fire department to seek assistance in obtaining) . Use Incentive Spirometer and flutter valve and coughing and deep breathing exercises . Contact provider for increase use of rescue inhaler/nebulizer (use rescue inhaler and nebulizer for  increase in shortness of breath) . Call LVAD team for increase in shortness of breath on day 2 instead of waiting days later when it is worse Follow Up Plan: The care management team will reach out to the patient again over the next 20 business days.     Problem: Recurrent GI Bleeds leading to hospitalizations   Priority: High  Long-Range Goal: Patient will report no recurrent hospitalizations related to GI bleed in the next 90 days   Start Date: 01/03/2021  Expected End Date: 07/05/2021  This Visit's Progress: On track  Recent Progress: Not on track  Priority: High  Current Barriers:  [name] Knowledge deficits related to self health management of anemia or bleeding as evidenced by recent recurrent hospitalizations.  Home INR machine is now working and patient has been checking weekly and reporting to Cardiology team.  INR has been subtherapeutic (1.4) and coumadin doses are being adjusted.  Reviewed signs and symptoms of anemia with patient. Nurse Case Manager Clinical Goal(s):   patient will take all medications exactly as  prescribed and will call provider for medication related questions  patient will not experience hospital admission within the next 90 days related to GI bleeding. Hospital Admissions in last 6 months =6  patient will attend all scheduled medical appointments: LVAD clinic/INR check  Interventions:  . Collaboration with [name], MD regarding development and update of comprehensive plan of care as evidenced by provider attestation and co-signature . Inter-disciplinary care team collaboration (see longitudinal plan of care) . Medications reviewed . Encouraged to continue to increase activity as tolerated . Encouraged sitting before standing and using an assistive device . Encouraged dietary changes to increase dietary intake of iron, Vitamin W41 and folic acid as advised/prescribed . Provided education about signs and symptoms of active bleeding and advised when to call provider or 911 . Signs/symptoms of bleeding reviewed (weakness, exhaustion, paleness, sleepiness, blood in stool) . Encouraged to contact provider/cardiology with next INR check weekly Patient Goals/Self-Care Activities: . Continue to monitor yourself for low hemoglobin (feeling tired, exhausted, pale, shortness of breath) . Track what makes symptoms worse and what makes them better  . Contact provider for symptoms right away . Pay attention to LVAD alerts . Monitor stools for blood . Obtain home INR and call to Cardiology office for medication adjustment weekly as ordered Follow Up Plan: The care management team will reach out to the patient again over the next 20 business days.       Plan:The care management team will reach out to the patient again over the next 20 business days.   Hubert Azure RN, MSN RN Care Management Coordinator [place] [phone] [email or web address]

## 2021-02-24 ENCOUNTER — Other Ambulatory Visit: Payer: Self-pay

## 2021-02-24 MED ORDER — FOLIC ACID 1 MG PO TABS: 1.0000 mg | ORAL_TABLET | Freq: Every day | ORAL | 0 refills | Status: DC

## 2021-03-01 DIAGNOSIS — Z952 Presence of prosthetic heart valve: Secondary | ICD-10-CM | POA: Diagnosis not present

## 2021-03-01 DIAGNOSIS — Z7901 Long term (current) use of anticoagulants: Secondary | ICD-10-CM | POA: Diagnosis not present

## 2021-03-02 ENCOUNTER — Other Ambulatory Visit: Payer: Self-pay | Admitting: Internal Medicine

## 2021-03-02 DIAGNOSIS — I255 Ischemic cardiomyopathy: Secondary | ICD-10-CM | POA: Diagnosis not present

## 2021-03-02 DIAGNOSIS — Z23 Encounter for immunization: Secondary | ICD-10-CM | POA: Diagnosis not present

## 2021-03-02 DIAGNOSIS — Z952 Presence of prosthetic heart valve: Secondary | ICD-10-CM | POA: Diagnosis not present

## 2021-03-02 DIAGNOSIS — I110 Hypertensive heart disease with heart failure: Secondary | ICD-10-CM | POA: Diagnosis not present

## 2021-03-02 DIAGNOSIS — I2510 Atherosclerotic heart disease of native coronary artery without angina pectoris: Secondary | ICD-10-CM | POA: Diagnosis not present

## 2021-03-02 DIAGNOSIS — Z951 Presence of aortocoronary bypass graft: Secondary | ICD-10-CM | POA: Diagnosis not present

## 2021-03-02 DIAGNOSIS — Z95810 Presence of automatic (implantable) cardiac defibrillator: Secondary | ICD-10-CM | POA: Diagnosis not present

## 2021-03-02 DIAGNOSIS — I10 Essential (primary) hypertension: Secondary | ICD-10-CM | POA: Diagnosis not present

## 2021-03-02 DIAGNOSIS — Z95811 Presence of heart assist device: Secondary | ICD-10-CM | POA: Diagnosis not present

## 2021-03-02 DIAGNOSIS — R9431 Abnormal electrocardiogram [ECG] [EKG]: Secondary | ICD-10-CM | POA: Diagnosis not present

## 2021-03-02 DIAGNOSIS — I739 Peripheral vascular disease, unspecified: Secondary | ICD-10-CM | POA: Diagnosis not present

## 2021-03-02 DIAGNOSIS — I214 Non-ST elevation (NSTEMI) myocardial infarction: Secondary | ICD-10-CM | POA: Diagnosis not present

## 2021-03-02 DIAGNOSIS — Z7901 Long term (current) use of anticoagulants: Secondary | ICD-10-CM | POA: Diagnosis not present

## 2021-03-02 DIAGNOSIS — Z79899 Other long term (current) drug therapy: Secondary | ICD-10-CM | POA: Diagnosis not present

## 2021-03-02 DIAGNOSIS — I5022 Chronic systolic (congestive) heart failure: Secondary | ICD-10-CM | POA: Diagnosis not present

## 2021-03-03 ENCOUNTER — Ambulatory Visit: Payer: Medicare Other | Admitting: Pharmacist

## 2021-03-03 DIAGNOSIS — J439 Emphysema, unspecified: Secondary | ICD-10-CM

## 2021-03-03 DIAGNOSIS — D509 Iron deficiency anemia, unspecified: Secondary | ICD-10-CM | POA: Diagnosis not present

## 2021-03-03 DIAGNOSIS — I5032 Chronic diastolic (congestive) heart failure: Secondary | ICD-10-CM

## 2021-03-03 DIAGNOSIS — I25700 Atherosclerosis of coronary artery bypass graft(s), unspecified, with unstable angina pectoris: Secondary | ICD-10-CM

## 2021-03-03 DIAGNOSIS — I059 Rheumatic mitral valve disease, unspecified: Secondary | ICD-10-CM

## 2021-03-03 DIAGNOSIS — I5043 Acute on chronic combined systolic (congestive) and diastolic (congestive) heart failure: Secondary | ICD-10-CM | POA: Diagnosis not present

## 2021-03-03 DIAGNOSIS — D62 Acute posthemorrhagic anemia: Secondary | ICD-10-CM | POA: Diagnosis not present

## 2021-03-03 DIAGNOSIS — E1142 Type 2 diabetes mellitus with diabetic polyneuropathy: Secondary | ICD-10-CM | POA: Diagnosis not present

## 2021-03-03 DIAGNOSIS — E7800 Pure hypercholesterolemia, unspecified: Secondary | ICD-10-CM | POA: Diagnosis not present

## 2021-03-03 DIAGNOSIS — Z95811 Presence of heart assist device: Secondary | ICD-10-CM

## 2021-03-03 DIAGNOSIS — F325 Major depressive disorder, single episode, in full remission: Secondary | ICD-10-CM | POA: Diagnosis not present

## 2021-03-03 DIAGNOSIS — M109 Gout, unspecified: Secondary | ICD-10-CM

## 2021-03-03 NOTE — Patient Instructions (Signed)
Visit Information  PATIENT GOALS: Goals Addressed              This Visit's Progress     Patient Stated   .  PharmD - Medication Monitoring (pt-stated)        Patient Goals/Self-Care Activities . Over the next 90 days, patient will:  - take medications as prescribed check blood pressure periodically, document, and provide at future appointments weigh daily, and contact provider if weight gain of >3 lbs/day, > 5 lbs/week       Patient verbalizes understanding of instructions provided today and agrees to view in [place].   Plan: Telephone follow up appointment with care management team member scheduled for:  ~ 12 weeks  [name] Darnelle Maffucci, PharmD, [place], [place] Clinical Pharmacist [employer] [facility] [employer] [phone]

## 2021-03-03 NOTE — Chronic Care Management (AMB) (Signed)
Chronic Care Management Pharmacy Note  03/03/2021 Name:  SOL ODOR MRN:  [id number] DOB:  Feb 18, 1951  Subjective: Ashley [name] is an 70 y.o. year old female who is a primary patient of [name], [name], MD.  The CCM team was consulted for assistance with disease management and care coordination needs.    Engaged with patient by telephone for follow up visit in response to provider referral for pharmacy case management and/or care coordination services.   Consent to Services:  The patient was given information about Chronic Care Management services, agreed to services, and gave verbal consent prior to initiation of services.  Please see initial visit note for detailed documentation.   Patient Care Team: [name], MD as PCP - General (Internal Medicine) Isaias Cowman, MD as Consulting Physician (Cardiology) De Hollingshead, RPH-CPP as Pharmacist (Pharmacist) Edrick Kins, MD as Rounding Team (Internal Medicine) Leona Singleton, RN as Case Manager  Recent office visits: None since our last call  Recent consult visits:  3/2 - LVAD f/u, increased warfarin, reduced torsemide to three days weekly with potassium and magnesium  3/2- chest CT showed amiodarone toxicity, but improved  4/6 - RN CM call, increased SOB even with self increased torsemide  4/6-4/10 - SOB, received diuresis and ABX for CAP/bronchitis; increased Entresto dose, to f/u with pulm  4/11 - phone call from RN CM. Reviewed to contact LVAD team on day 1 of increased SOB  4/15 - HF f/u. no change, discussed referral to pulmonary  4/27 - HF f/u, added carvedilol 6.25 mg BID  Hospital visits: 4/6-4/10 - SOB, received diuresis and ABX for CAP/bronchitis; increased Entresto dose, to f/u with pulm  Objective:  Lab Results  Component Value Date   CREATININE 1.00 12/24/2020   CREATININE 1.31 (H) 07/05/2020   CREATININE 1.77 (H) 06/21/2020    Lab Results  Component Value  Date   HGBA1C 4.7 10/15/2020   Last diabetic Eye exam: No results found for: HMDIABEYEEXA  Last diabetic Foot exam: No results found for: HMDIABFOOTEX      Component Value Date/Time   CHOL 155 01/01/2020 0328   CHOL 218 (H) 05/30/2013 0038   TRIG 70 01/01/2020 0328   TRIG 381 (H) 05/30/2013 0038   HDL 50 01/01/2020 0328   HDL 35 (L) 05/30/2013 0038   CHOLHDL 3.1 01/01/2020 0328   VLDL 14 01/01/2020 0328   VLDL 76 (H) 05/30/2013 0038   LDLCALC 91 01/01/2020 0328   LDLCALC 139 (H) 05/06/2018 1107   LDLCALC 107 (H) 05/30/2013 0038   LDLDIRECT 62.0 11/08/2018 1412    Hepatic Function Latest Ref Rng & Units 12/24/2020 02/04/2020 12/31/2019  Total Protein 6.0 - 8.3 g/dL 6.2 - 7.0  Albumin 3.5 - 5.2 g/dL 3.1(L) 3.8 3.8  AST 0 - 37 U/L 152(H) 16 21  ALT 0 - 35 U/L 122(H) 15 18  Alk Phosphatase 39 - 117 U/L 111 56 64  Total Bilirubin 0.2 - 1.2 mg/dL 1.1 - 1.9(H)  Bilirubin, Direct 0.0 - 0.2 mg/dL - - -    Lab Results  Component Value Date/Time   TSH 0.725 09/23/2019 04:43 AM   TSH 1.791 07/17/2018 01:07 PM   FREET4 0.91 09/22/2019 03:35 PM    CBC Latest Ref Rng & Units 12/24/2020 02/04/2020 01/01/2020  WBC 4.0 - 10.5 K/uL 10.0 9.2 11.6(H)  Hemoglobin 12.0 - 15.0 g/dL 8.1(L) 12.2 12.0  Hematocrit 36.0 - 46.0 % 24.3(L) 36 34.4(L)  Platelets 150.0 - 400.0 K/uL  383.0 224 166   Clinical ASCVD: Yes  The ASCVD Risk score Mikey Bussing DC Jr., et al., 2013) failed to calculate for the following reasons:   The patient has a prior MI or stroke diagnosis     Social History   Tobacco Use  Smoking Status Former Smoker  . Packs/day: 0.25  . Types: Cigarettes  . Quit date: 06/19/2019  . Years since quitting: 1.7  Smokeless Tobacco Never Used  Tobacco Comment   reports smoking 2-3 cigarettes/ day   BP Readings from Last 3 Encounters:  07/05/20 102/70  05/07/20 140/87  01/01/20 121/73   Pulse Readings from Last 3 Encounters:  07/05/20 95  05/07/20 87  01/01/20 82   Wt Readings from  Last 3 Encounters:  12/22/20 142 lb (64.4 kg)  07/05/20 166 lb 3.2 oz (75.4 kg)  05/07/20 169 lb (76.7 kg)    Assessment: Review of patient past medical history, allergies, medications, health status, including review of consultants reports, laboratory and other test data, was performed as part of comprehensive evaluation and provision of chronic care management services.   SDOH:  (Social Determinants of Health) assessments and interventions performed:  SDOH Interventions   Flowsheet Row Most Recent Value  SDOH Interventions   Financial Strain Interventions Intervention Not Indicated      CCM Care Plan  Allergies  Allergen Reactions  . Vancomycin Nausea And Vomiting and Palpitations  . Hydrocodone-Acetaminophen Nausea Only    Medications Reviewed Today    Reviewed by De Hollingshead, RPH-CPP (Pharmacist) on 03/03/21 at 1305  Med List Status: <None>  Medication Order Taking? Sig Documenting Provider Last Dose Status Informant  acetaminophen (TYLENOL) 325 MG tablet [id number] No Take 650 mg by mouth every 6 (six) hours as needed.  Patient not taking: Reported on 03/03/2021   [provider] Not Taking Active   albuterol (PROAIR HFA) 108 (90 Base) MCG/ACT inhaler [id number] No Inhale 1-2 puffs into the lungs every 6 (six) hours as needed for wheezing or shortness of breath.  Patient not taking: Reported on 03/03/2021   [name], MD Not Taking Active   allopurinol (ZYLOPRIM) 100 MG tablet [id number] Yes TAKE ONE TABLET BY MOUTH ONCE DAILY [name], MD Taking Active   ALPRAZolam Duanne Moron) 0.5 MG tablet [id number] No TAKE 1/2 TO 1 TABLET BY MOUTH AT BEDTIME AS NEEDED FOR ANXIETY  Patient not taking: Reported on 03/03/2021   [name], MD Not Taking Active   amitriptyline (ELAVIL) 25 MG tablet [id number] Yes TAKE ONE TABLET BY MOUTH AT BEDTIME [name], MD Taking Active   amLODipine (NORVASC) 2.5 MG tablet [id number] Yes Take 2.5 mg by mouth daily.  [provider] Taking Active   atorvastatin (LIPITOR) 40 MG tablet [id number] Yes Take 1 tablet (40 mg total) by mouth daily. [name], MD Taking Active   carvedilol (COREG) 6.25 MG tablet [id number] Yes Take 6.25 mg by mouth 2 (two) times daily. [provider] Taking Active   colchicine 0.6 MG tablet [id number] No TAKE TWO TABLETS BY MOUTH AT FIRST SIGN OF GOUT FLARE FOLLOW BY 1 TABLET DAILY UNTIL GOUT FLARE RESOLVES  Patient not taking: Reported on 03/03/2021   [provider] Not Taking Active            Med Note Hubert Azure D   Mon Jan 03, 2021 10:16 AM) Reports taking only when gout flares up  cyanocobalamin (,VITAMIN B-12,) 1000 MCG/ML injection [id number] Yes Inject 1 mL into the  muscle monthly  Patient taking differently: Inject 1,000 mcg into the muscle every 30 (thirty) days.   [name], MD Taking Active   escitalopram (LEXAPRO) 10 MG tablet [id number] Yes TAKE ONE TABLET BY MOUTH ONCE DAILY [name], MD Taking Active   ferrous sulfate 325 (65 FE) MG tablet [id number] Yes Take 325 mg by mouth daily with breakfast. [name], MD Taking Active Self  fluticasone (FLONASE) 50 MCG/ACT nasal spray [id number] Yes Place 1 spray into both nostrils as needed for allergies or rhinitis. [name], MD Taking Active Self  folic acid (FOLVITE) 1 MG tablet [id number] Yes Take 1 tablet (1 mg total) by mouth daily. [name], MD Taking Active   [facility] MUCUS ER 600 MG 12 hr tablet [id number] No Take 600 mg by mouth 2 (two) times daily.  Patient not taking: Reported on 03/03/2021   [provider] Not Taking Active            Med Note Sadie Haber Jan 03, 2021 10:17 AM) Reports taking as needed  ipratropium-albuterol (DUONEB) 0.5-2.5 (3) MG/3ML SOLN [id number] No USE 1 VIAL VIA NEBULIZER AND INHALE BY MOUTH INTO THE LUNGS EVERY 6 HOURS AS NEEDED  Patient not taking: Reported on 03/03/2021   [name], MD Not Taking Active    levothyroxine (SYNTHROID) 50 MCG tablet [id number] Yes Take 1 tablet by mouth daily. [provider] Taking Active   magnesium oxide (MAG-OX) 400 (241.3 Mg) MG tablet [id number] Yes Take 1 tablet by mouth every Monday, Wednesday, and Friday. [provider] Taking Active Self  metFORMIN (GLUCOPHAGE-XR) 500 MG 24 hr tablet [id number] Yes Take 2 tablets (1,000 mg total) by mouth every morning. [name], MD Taking Active   omeprazole (PRILOSEC) 40 MG capsule [id number] Yes TAKE ONE CAPSULE BY MOUTH ONCE DAILY  Patient taking differently: Take 40 mg by mouth in the morning and at bedtime.   [name], MD Taking Active            Med Note Sadie Haber Jan 03, 2021 10:18 AM) Reports taking daily  potassium chloride (KLOR-CON) 10 MEQ tablet [id number] Yes Take 10 mEq by mouth every Monday, Wednesday, and Friday. Take 2 tablets every Monday Wednesday Friday [provider] Taking Active Self           Med Note ([place] Dec 14, 2020  1:28 PM)    sacubitril-valsartan (ENTRESTO) 97-103 MG [id number] Yes Take 1 tablet by mouth 2 (two) times daily. [provider] Taking Active            Med Note Darnelle Maffucci, Maralyn Sago Mar 03, 2021  1:03 PM)    senna-docusate (SENOKOT-S) 8.6-50 MG tablet [id number] No Take by mouth.  Patient not taking: Reported on 03/03/2021   [provider] Not Taking Active   torsemide (DEMADEX) 20 MG tablet [id number] Yes Take 20 mg by mouth daily. Isaias Cowman, MD Taking Active            Med Note Nat Christen Mar 03, 2021  1:04 PM)    traZODone (DESYREL) 50 MG tablet [id number] Yes TAKE 1/2 TO 1 TABLET BY MOUTH EVERY EVENING 1 HOUR BEFORE BEDTIME [name], MD Taking Active   TRELEGY ELLIPTA 100-62.5-25 MCG/INH AEPB [id number] Yes INHALE 1 PUFF BY MOUTH INTO THE LUNGS EVERY DAY [name], MD  Taking Active   warfarin (COUMADIN) 2 MG tablet [id number] Yes Take 4 mg by  mouth at bedtime. Monday, Wednesday and Friday 3 mg and AOD takes 4 mg. [provider] Taking Active           Patient Active Problem List   Diagnosis Date Noted  . Sternal wound infection 12/23/2020  . Anticoagulation goal of INR 1.5 to 2.5 12/23/2020  . LVAD (left ventricular assist device) present ([place]) 12/23/2020  . Complication involving left ventricular assist device (LVAD) 12/23/2020  . Hypothyroidism 12/23/2020  . Snoring 07/05/2020  . Pressure injury of skin 01/01/2020  . CKD (chronic kidney disease), stage IIIa 12/31/2019  . Pulmonary edema cardiac cause ([place]) 12/25/2019  . Hyperlipidemia   . Hyperkalemia   . Gouty arthritis of both feet 11/20/2019  . Mitral stenosis with insufficiency   . Hypertensive urgency   . Depression   . Hyperglycemia 09/22/2019  . Presence of permanent cardiac pacemaker   . Acute blood loss anemia   . Chronic kidney disease   . Hypertension   . CHF (congestive heart failure) ([place]) 07/16/2019  . Insomnia 05/07/2019  . Suspected COVID-19 virus infection 03/23/2019  . AVM (arteriovenous malformation) of small bowel, acquired   . Anemia, iron deficiency 11/10/2018  . Rectal polyp   . Benign neoplasm of cecum   . Barrett's esophagus without dysplasia   . Chronic diastolic heart failure ([place]) 07/03/2018  . HTN (hypertension) 07/03/2018  . COPD with emphysema ([place]) 06/28/2018  . B12 deficiency anemia 06/28/2018  . Personal history of colon cancer   . Polyp of sigmoid colon   . Diverticulosis of large intestine without diverticulitis   . 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 Renal artery stenosis ([place]) 10/03/2016  . 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 Tobacco abuse counseling 10/11/2014  . Tobacco abuse 10/11/2014  . Chronic right hip pain 08/26/2014  . Atherosclerosis of native artery of extremity with intermittent claudication ([place]) 06/18/2013  . Preoperative evaluation to rule out  surgical contraindication 06/18/2013  . CAD (coronary artery disease) 06/01/2013  . GERD (gastroesophageal reflux disease) 06/01/2013  . Hypercholesterolemia 06/01/2013  . Tubular adenoma of colon 06/01/2013  . Major depressive disorder in remission ([place]) 06/01/2013    Immunization History  Administered Date(s) Administered  . Fluad Quad(high Dose 65+) 09/23/2019  . Influenza Split 08/20/2014  . Influenza,inj,Quad PF,6+ Mos 06/28/2016, 07/29/2018  . Influenza-Unspecified 08/04/2020  . Janssen (J&J) SARS-COV-2 Vaccination 09/07/2020  . PFIZER(Purple Top)SARS-COV-2 Vaccination 11/30/2020, 03/02/2021  . Pneumococcal Polysaccharide-23 08/20/2014, 06/21/2019    Conditions to be addressed/monitored: CHF, CAD, HTN, HLD, COPD, DMII and Depression  Care Plan : PharmD - Medication Management  Updates made by De Hollingshead, RPH-CPP since 03/03/2021 12:00 AM    Problem: CAD, CHF, gout, mitral valve insufficiency, COPD     Long-Range Goal: Disease Progression Prevention   This Visit's Progress: On track  Recent Progress: On track  Priority: High  Note:   Current Barriers:  . Complex patient with high risk for hospital admission  Pharmacist Clinical Goal(s):  [name] Over the next 90 days, patient will achieve adherence to monitoring guidelines and medication adherence to achieve therapeutic efficacy. through collaboration with PharmD and provider.   Interventions: . 1:1 collaboration with [name], MD regarding development and update of comprehensive plan of care as evidenced by provider attestation and co-signature . Inter-disciplinary care team collaboration (see longitudinal plan of care) . Comprehensive medication review performed; medication list updated in electronic  medical record  Health Maintenance: . Admitted to [facility] 4/6-4/10 for SOB - thought to be primarily related to COPD/pulmonary exacerbation  Hx GIB: . Controlled; current treatment: omeprazole 40 mg  daily. Denies any BRBPR, tarry stools, blood on the toilet paper.  . Daily ferrous sulfate 325 mg. . Continue current regimen as above  HFrEF (LVAD) . Appropriately managed; current treatment: Entresto 97/103 mg BID, carvedilol 6.25 mg BID; torsemide 20 mg daily, potassium 20 mEq daily; magnesium 400 mg daily; follows closely w/ [facility] cardiology; amlodipine 2.5 mg dail for additional HTN benefit . LVAD anticoagulation; goal INR 1.5-2.5 d/t hx GIB; warfarin 4 mg daily except M managed by [facility] . Monitoring home BP and weights daily. Weights stable. Denies any concerns with lightheadedness, dizziness . Took home BP machine to [facility] for validation yesterday. Using doppler device. Reports that it worked correctly at their office . Continue current regimen at this time, along with home vital measuring. Recommend writing down vitals in book supplied by RN CM for tracking. Reviewed to contact cardiology if 1 day of increased SOB, rather than waiting longer. . Recommended to continue current regimen w/ cardiology collaboration.   Hyperlipidemia, CAD (hx NSTEMI, s/p CABG x3 and mitral valve repair in 2007) MVR w/ replacement 2021 . Controlled per last lipid panel; current treatment: atorvastatin 40 mg daily . Antiplatelet regimen: none, held after recent GI bleed in setting of concurrent anticoagulation . Recommended to continue current regimen at this time  Chronic Obstructive Pulmonary Disease s/p tobacco cessation, worsened by amiodarone tx: [name] Symptoms controlled today; current treatment: Trelegy 100/25/62.5 daily, albuterol nebulizer PRN and albuterol HFA PRN - infrequent use over the past several weeks.  . Allergies - intranasal fluticasone PRN . 2 exacerbations requiring treatment (hospitalization) in the last 6 months  . Referral to [facility] in place. Patient reports she has an appointment scheduled at the end of May. Discussed pulmonary toxicities of amiodarone.  . Recommended to  continue current regimen at this time  along with upcoming pulmonary collaboration  Gout: . Not at goal, last uric acid per Care Everywhere still >6. current treatment: allopurinol 100 mg daily, colchicine PRN. Has not required colchicine in several months.  . Recommended to continue current regimen at this time.  . Consider updated uric acid moving forward, could consider titration of allopurinol.  Anxiety/Depression w/ Insomnia: . Well controlled per patient report current treatment: escitalopram 10 mg daily, amitriptyline 25 mg QPM, trazodone 50 mg QPM, PRN alprazolam 0.25 mg QPM- denies any use of alprazolam in the past few days . Encouraged to continue current regimen at this time. Continue to monitor for s/sx serotonin syndrome.   Diabetes: . Controlled; current regimen: metformin XR 1000 mg daily . Last A1c per Care Everywhere <5% . Consider decrease or discontinuation of metformin to reduce pill burden.  . Due for diabetic eye exam. Patient notes she does not have an eye doctor. Also appropriate for ophthalmic evaluation given hx amiodarone use. Encouraged to discuss w/ PCP at next appointment.  Hypothyroidism: . Uncontrolled but anticipated to be improved; current treatment: levothyroxine 50 mcg daily . Lab work yesterday showed TSH elevated. Will follow for cardiology recommendations. . Continue to follow. Continue current regimen at this time.   Supplements: [name] Vitamin B12 injections monthly, folic acid 1 mg; magnesium, potassium, ferrous sulfate as above  Patient Goals/Self-Care Activities . Over the next 90 days, patient will:  - take medications as prescribed check blood pressure periodically, document, and provide  at future appointments weigh daily, and contact provider if weight gain of >3 lbs/day, > 5 lbs/week  Follow Up Plan: Telephone follow up appointment with care management team member scheduled for: ~ 12 weeks     Medication Assistance: None required.  Patient  affirms current coverage meets needs.  Patient's preferred pharmacy is:  [facility], [address], [address] [place] [address] Phone: [phone] Fax: [facility], [address] [address] Phone: [phone] Fax: [phone]  [id number] - [place], [address] [place] [address] Phone: [phone] Fax: [phone]   Follow Up:  Patient agrees to Care Plan and Follow-up.  Plan: Telephone follow up appointment with care management team member scheduled for:  ~ 12 weeks  [name] Darnelle Maffucci, PharmD, [place], [place] Clinical Pharmacist [employer] [facility] [employer] [phone]

## 2021-03-04 ENCOUNTER — Other Ambulatory Visit: Payer: Self-pay | Admitting: Internal Medicine

## 2021-03-04 ENCOUNTER — Telehealth: Payer: Self-pay | Admitting: Internal Medicine

## 2021-03-04 NOTE — Progress Notes (Signed)
I am recommending dc metformin due to recent a1c of 4.9,  And dc amitriptyline given low dose and risk of medication

## 2021-03-04 NOTE — Telephone Encounter (Signed)
Fax has been placed in red folder for review. Pharmacy needs to know if the recommendation is going to be refused or if the medication will be changed and new rx sent in.

## 2021-03-04 NOTE — Telephone Encounter (Signed)
Spoke with [facility] and they stated that they have faxed over a sheet stating that the medication pt is using is a high risk medication and they would like for pt to try a different medication. Asked that they re fax the form so Dr. Derrel Nip can take a look at it. Also let them know that the pt is on diabetes medication. She stated that when the pharmacist got back from lunch she would talk to her about that.

## 2021-03-04 NOTE — Telephone Encounter (Signed)
[facility] called in stating they have been trying for 2 weeks to get medication changes for amitriptyline (ELAVIL) 25 MG tablet and also stated the insurance was stating that the PT needed to be put on diabetic medicine.

## 2021-03-04 NOTE — Telephone Encounter (Signed)
Spoke with pt and let her know that Dr. Derrel Nip wants her to stop taking the amitriptyline because it is a high risk medication for the pt and the metformin because her A1c is 4.9. Pt gave a verbal understanding. Also called pharmacy to let them know and have also faxed the form back to them.

## 2021-03-04 NOTE — Telephone Encounter (Signed)
I have reviewed her recent labs done elsewhere.  I am recommending dc metformin due to recent a1c of 4.9,  And dc amitriptyline given low dose and risk of medication

## 2021-03-09 ENCOUNTER — Ambulatory Visit: Payer: Medicare Other | Admitting: Internal Medicine

## 2021-03-11 ENCOUNTER — Ambulatory Visit (INDEPENDENT_AMBULATORY_CARE_PROVIDER_SITE_OTHER): Payer: Medicare Other | Admitting: *Deleted

## 2021-03-11 DIAGNOSIS — I10 Essential (primary) hypertension: Secondary | ICD-10-CM | POA: Diagnosis not present

## 2021-03-11 DIAGNOSIS — I5043 Acute on chronic combined systolic (congestive) and diastolic (congestive) heart failure: Secondary | ICD-10-CM

## 2021-03-11 DIAGNOSIS — D509 Iron deficiency anemia, unspecified: Secondary | ICD-10-CM | POA: Diagnosis not present

## 2021-03-11 DIAGNOSIS — K5520 Angiodysplasia of colon without hemorrhage: Secondary | ICD-10-CM

## 2021-03-11 DIAGNOSIS — J439 Emphysema, unspecified: Secondary | ICD-10-CM | POA: Diagnosis not present

## 2021-03-11 DIAGNOSIS — I5032 Chronic diastolic (congestive) heart failure: Secondary | ICD-10-CM

## 2021-03-11 DIAGNOSIS — D519 Vitamin B12 deficiency anemia, unspecified: Secondary | ICD-10-CM | POA: Diagnosis not present

## 2021-03-11 NOTE — Patient Instructions (Signed)
Visit Information  PATIENT GOALS: Goals Addressed            This Visit's Progress   . (RNCM) Track and Manage Fluids and Swelling   On track    Timeframe:  Long-Range Goal Priority:  High Start Date:  01/03/21                           Expected End Date:   07/05/21                    Follow Up Date 03/25/2021    . Call office if I gain more than 2 pounds in one day or 5 pounds in one week . Weigh myself daily and record in log along with LVAD daily parameters for provider review . Monitor lower extremities and abdomen for swelling . Take torsemide daily as prescribed . Call LVAD team as soon as possible with increase in shortness of breath, do not wait days later . Reschedule missed appointment with PCP as soon as possible . Have fiance assist with monitoring/Doppler blood pressures daily  Why is this important?   It is important to check your weight daily and watch how much salt and liquids you have.  It will help you to manage your heart failure.    Notes:     [name] (RNCM) Track and Manage My Symptoms-COPD   On track    Timeframe:  Long-Range Goal Priority:  High Start Date:  01/03/21                           Expected End Date:   07/05/21                    Follow Up Date 03/25/21    . Develop a rescue plan and follow if symptoms flare up . Monitor and Eliminate symptom triggers at home . Obtain CO2 Detectors for home (contact local fire department to seek assistance in obtaining) . Use Incentive Spirometer and flutter valve and coughing and deep breathing exercises . Contact provider for increase use of rescue inhaler/nebulizer (use rescue inhaler and nebulizer for increase in shortness of breath) . Call LVAD team for increase in shortness of breath on day 2 instead of waiting days later when it is worse   Why is this important?    Tracking your symptoms and other information about your health helps your doctor plan your care.   Write down the symptoms, the time of day,  what you were doing and what medicine you are taking.   You will soon learn how to manage your symptoms.     Notes:     [name] (RNCM) Track My Symptoms-GI Bleed   On track    Timeframe:  Long-Range Goal Priority:  Medium Start Date: 01/03/21                            Expected End Date: 07/05/21                      Follow Up Date 03/25/21   . Continue to monitor yourself for low hemoglobin (feeling tired, exhausted, pale, shortness of breath) . Track what makes symptoms worse and what makes them better  . Contact provider for symptoms right away . Pay attention to LVAD alerts . Monitor stools for blood .  Obtain home INR and call to Cardiology office for medication adjustment weekly as ordered   Why is this important?    Keeping track of symptoms that you have helps you to understand your condition. You will also learn what works to manage it.   You and your doctor will then be able to come up with the best treatment plan for you.    Notes:        Patient verbalizes understanding of instructions provided today and agrees to view in [place].   The care management team will reach out to the patient again over the next 20 business days.   Hubert Azure RN, MSN RN Care Management Coordinator [place] [phone] [email or web address]

## 2021-03-11 NOTE — Chronic Care Management (AMB) (Signed)
Chronic Care Management   CCM RN Visit Note  03/11/2021 Name: Ashley [name] MRN: [id number] DOB: 11-28-1950  Subjective: Ashley [name] is a 70 y.o. year old female who is a primary care patient of [name], MD. The care management team was consulted for assistance with disease management and care coordination needs.    Engaged with patient by telephone for follow up visit in response to provider referral for case management and/or care coordination services.   Consent to Services:  The patient was given information about Chronic Care Management services, agreed to services, and gave verbal consent prior to initiation of services.  Please see initial visit note for detailed documentation.   Patient agreed to services and verbal consent obtained.   Assessment: Review of patient past medical history, allergies, medications, health status, including review of consultants reports, laboratory and other test data, was performed as part of comprehensive evaluation and provision of chronic care management services.   SDOH (Social Determinants of Health) assessments and interventions performed:    CCM Care Plan  Allergies  Allergen Reactions  . Vancomycin Nausea And Vomiting and Palpitations  . Hydrocodone-Acetaminophen Nausea Only    Outpatient Encounter Medications as of 03/11/2021  Medication Sig Note  . albuterol (PROAIR HFA) 108 (90 Base) MCG/ACT inhaler Inhale 1-2 puffs into the lungs every 6 (six) hours as needed for wheezing or shortness of breath.   . carvedilol (COREG) 6.25 MG tablet Take 6.25 mg by mouth 2 (two) times daily.   [name] torsemide (DEMADEX) 20 MG tablet Take 20 mg by mouth daily.   . TRELEGY ELLIPTA 100-62.5-25 MCG/INH AEPB INHALE 1 PUFF BY MOUTH INTO THE LUNGS EVERY DAY   . acetaminophen (TYLENOL) 325 MG tablet Take 650 mg by mouth every 6 (six) hours as needed. (Patient not taking: Reported on 03/03/2021)   . allopurinol (ZYLOPRIM) 100 MG tablet TAKE ONE  TABLET BY MOUTH ONCE DAILY   . ALPRAZolam (XANAX) 0.5 MG tablet TAKE 1/2 TO 1 TABLET BY MOUTH AT BEDTIME AS NEEDED FOR ANXIETY (Patient not taking: Reported on 03/03/2021)   . amLODipine (NORVASC) 2.5 MG tablet Take 2.5 mg by mouth daily.   [name] atorvastatin (LIPITOR) 40 MG tablet Take 1 tablet (40 mg total) by mouth daily.   . colchicine 0.6 MG tablet TAKE TWO TABLETS BY MOUTH AT FIRST SIGN OF GOUT FLARE FOLLOW BY 1 TABLET DAILY UNTIL GOUT FLARE RESOLVES (Patient not taking: Reported on 03/03/2021) 01/03/2021: Reports taking only when gout flares up  . cyanocobalamin (,VITAMIN B-12,) 1000 MCG/ML injection Inject 1 mL into the muscle monthly (Patient taking differently: Inject 1,000 mcg into the muscle every 30 (thirty) days.)   . escitalopram (LEXAPRO) 10 MG tablet TAKE ONE TABLET BY MOUTH ONCE DAILY   . ferrous sulfate 325 (65 FE) MG tablet Take 325 mg by mouth daily with breakfast.   . fluticasone (FLONASE) 50 MCG/ACT nasal spray Place 1 spray into both nostrils as needed for allergies or rhinitis.   . folic acid (FOLVITE) 1 MG tablet Take 1 tablet (1 mg total) by mouth daily.   . GNP MUCUS ER 600 MG 12 hr tablet Take 600 mg by mouth 2 (two) times daily. (Patient not taking: Reported on 03/03/2021) 01/03/2021: Reports taking as needed  . ipratropium-albuterol (DUONEB) 0.5-2.5 (3) MG/3ML SOLN USE 1 VIAL VIA NEBULIZER AND INHALE BY MOUTH INTO THE LUNGS EVERY 6 HOURS AS NEEDED (Patient not taking: Reported on 03/03/2021)   . levothyroxine (SYNTHROID) 50 MCG tablet  Take 1 tablet by mouth daily.   . magnesium oxide (MAG-OX) 400 (241.3 Mg) MG tablet Take 1 tablet by mouth every Monday, Wednesday, and Friday.   [name] omeprazole (PRILOSEC) 40 MG capsule TAKE ONE CAPSULE BY MOUTH ONCE DAILY (Patient taking differently: Take 40 mg by mouth in the morning and at bedtime.) 01/03/2021: Reports taking daily  . potassium chloride (KLOR-CON) 10 MEQ tablet Take 10 mEq by mouth every Monday, Wednesday, and Friday. Take 2 tablets  every Monday Wednesday Friday   . sacubitril-valsartan (ENTRESTO) 97-103 MG Take 1 tablet by mouth 2 (two) times daily.   [name] senna-docusate (SENOKOT-S) 8.6-50 MG tablet Take by mouth. (Patient not taking: Reported on 03/03/2021)   . traZODone (DESYREL) 50 MG tablet TAKE 1/2 TO 1 TABLET BY MOUTH EVERY EVENING 1 HOUR BEFORE BEDTIME   . warfarin (COUMADIN) 2 MG tablet Take 4 mg by mouth at bedtime. Monday, Wednesday and Friday 3 mg and AOD takes 4 mg.    No facility-administered encounter medications on file as of 03/11/2021.    Patient Active Problem List   Diagnosis Date Noted  . Sternal wound infection 12/23/2020  . Anticoagulation goal of INR 1.5 to 2.5 12/23/2020  . LVAD (left ventricular assist device) present ([place]) 12/23/2020  . Complication involving left ventricular assist device (LVAD) 12/23/2020  . Hypothyroidism 12/23/2020  . Snoring 07/05/2020  . Pressure injury of skin 01/01/2020  . CKD (chronic kidney disease), stage IIIa 12/31/2019  . Pulmonary edema cardiac cause ([place]) 12/25/2019  . Hyperlipidemia   . Hyperkalemia   . Gouty arthritis of both feet 11/20/2019  . Mitral stenosis with insufficiency   . Hypertensive urgency   . Depression   . Hyperglycemia 09/22/2019  . Presence of permanent cardiac pacemaker   . Acute blood loss anemia   . Chronic kidney disease   . Hypertension   . CHF (congestive heart failure) ([place]) 07/16/2019  . Insomnia 05/07/2019  . Suspected COVID-19 virus infection 03/23/2019  . AVM (arteriovenous malformation) of small bowel, acquired   . Anemia, iron deficiency 11/10/2018  . Rectal polyp   . Benign neoplasm of cecum   . Barrett's esophagus without dysplasia   . Chronic diastolic heart failure ([place]) 07/03/2018  . HTN (hypertension) 07/03/2018  . COPD with emphysema ([place]) 06/28/2018  . B12 deficiency anemia 06/28/2018  . Personal history of colon cancer   . Polyp of sigmoid colon   . Diverticulosis of large intestine without diverticulitis    . 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 Renal artery stenosis ([place]) 10/03/2016  . 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 Tobacco abuse counseling 10/11/2014  . Tobacco abuse 10/11/2014  . Chronic right hip pain 08/26/2014  . Atherosclerosis of native artery of extremity with intermittent claudication ([place]) 06/18/2013  . Preoperative evaluation to rule out surgical contraindication 06/18/2013  . CAD (coronary artery disease) 06/01/2013  . GERD (gastroesophageal reflux disease) 06/01/2013  . Hypercholesterolemia 06/01/2013  . Tubular adenoma of colon 06/01/2013  . Major depressive disorder in remission ([place]) 06/01/2013    Conditions to be addressed/monitored:CHF, COPD and Anemia  Care Plan : Heart Failure/COPD  Updates made by Leona Singleton, RN since 03/11/2021 12:00 AM  Problem: COPD and Heart Failure complications causing hospitalization   Priority: High  Long-Range Goal: Patient will report no COPD or heart failure exacerbations causing hospitalizations within the next 90 days.   Start Date: 01/03/2021  Expected End Date: 07/05/2021  This Visit's Progress: On track  Recent Progress: On  track  Priority: High  Current Barriers:  [name] Knowledge deficit related to self care management of CHF and COPD as evidenced by recent hospitalization related to fluid retention and pneumonia.  Reports she feels a lot better.  Denies any shortness of breath or lower extremity edema.  States she is able to get up and move around without being short of breath.  Weight this morning was 151.4.  Was not able to obtain blood pressure reading today, due to difficulities with dopplering on her own, but reports recent ranges of SBP doppler 60-70's (per patient and chart review Cardiology wants pressure <90 and patient stating she was told 70's are good).  Encouraged patient to review readings with Cardiology.  Does report some lightheadiness or dizziness when getting up too  fast.  Denies any LVAD alerts.  Reports compliance with Demadex.  Reports needing to use her rescue inhaler once in the last week.  Patient stating her nephew whom stayed with her and her fiance died of overdose this past week.  Acknowledges missed appointment with PCP due to dealing with this situation. Case Manager Clinical Goal(s):  . patient will verbalize understanding of Heart Failure and COPD Action Plan and when to call doctor . patient will take all mediations as prescribed . patient will weigh daily and record (notifying MD of 3 lb weight gain over night or 5 lb in a week) Interventions:  . Collaboration with [name], MD regarding development and update of comprehensive plan of care as evidenced by provider attestation and co-signature . Inter-disciplinary care team collaboration (see longitudinal plan of care) . Discussed with patient when to weigh each morning after emptying bladder and placing battery in LVAD per instructions . Discussed importance of daily weight and advised patient to weigh and record daily; encouraged patient to use 2022 Calendar booklet to document weights to keep track of increase for review and provider review (instead of trying to remember from day to day) . Reviewed role of diuretics in prevention of fluid overload and management of heart failure, discussed dose change and encouraged patient to monitor symptoms and notify provider if increase in fluid retention, weight, or shortness of breath . Encouraged patient to use incentive spirometer and flutter valve,and to do coughing and deep breathing exercises . Reviewed medications along with recent additions and discontinuations and encouraged medication compliance . Encouraged patient to obtain home CO2 detectors and to contact local Fire Department to verify any programs supplying families with CO2 detectors, reviewed where patient could purchase from . Discussed COPD triggers including pollen and allergy  season and action plan and when to use inhaler versus nebulizer . Encouraged increase use of nebulizer and rescue inhaler for shortness of breath to see if they help . Confirmed patient has LVAD supplies . Discussed patient monitoring blood pressures daily using doppler per cardiology request and encouraged patient to seek assistance from her fiance to help her doppler pressure and record reading . Encouraged patient to discuss blood pressure goal (high and low) with Cardiologist . Offered patient CCM Social work referral for grief counseling (declines at this time) . Provided patient emotional support and empathy with the recent loss of her nephew this week . Discussed missed medical appointment with primary care provider and encouraged to reschedule as soon as possible . Reinforced with patient to contact her LVAD team for shortness of breath on day 1 or 2, not to wait until shortness of breath is too severe (day 4) for treatment options or  instructions Patient Goals/Self-Care Activities . Call office if I gain more than 2 pounds in one day or 5 pounds in one week . Weigh myself daily and record in log along with LVAD daily parameters for provider review . Monitor lower extremities and abdomen for swelling . Take torsemide daily as prescribed . Call LVAD team as soon as possible with increase in shortness of breath, do not wait days later . Reschedule missed appointment with PCP as soon as possible . Have fiance assist with monitoring/Doppler blood pressures daily . Develop a rescue plan and follow if symptoms flare up . Monitor and Eliminate symptom triggers at home . Obtain CO2 Detectors for home (contact local fire department to seek assistance in obtaining) . Use Incentive Spirometer and flutter valve and coughing and deep breathing exercises . Contact provider for increase use of rescue inhaler/nebulizer (use rescue inhaler and nebulizer for increase in shortness of breath) Follow Up  Plan: The care management team will reach out to the patient again over the next 20 business days.     Problem: Recurrent GI Bleeds leading to hospitalizations   Priority: Medium  Long-Range Goal: Patient will report no recurrent hospitalizations related to GI bleed in the next 90 days   Start Date: 01/03/2021  Expected End Date: 07/05/2021  This Visit's Progress: On track  Recent Progress: On track  Priority: Medium  Current Barriers:  [name] Knowledge deficits related to self health management of anemia or bleeding as evidenced by recent recurrent hospitalizations.  Home INR machine is now working and patient has been checking weekly and reporting to Cardiology team.  INR has been subtherapeutic (1.7) and coumadin doses are being adjusted.  Reviewed signs and symptoms of anemia with patient. Nurse Case Manager Clinical Goal(s):   patient will take all medications exactly as prescribed and will call provider for medication related questions  patient will not experience hospital admission within the next 90 days related to GI bleeding. Hospital Admissions in last 6 months =6  patient will attend all scheduled medical appointments: LVAD clinic/INR check  Interventions:  . Collaboration with [name], MD regarding development and update of comprehensive plan of care as evidenced by provider attestation and co-signature . Inter-disciplinary care team collaboration (see longitudinal plan of care) . Medications reviewed . Encouraged to continue to increase activity as tolerated . Encouraged sitting before standing and using an assistive device; reviewed and reinforced fall precautions and preventions, encouraged patient to dangle before standing and to sit back down if feeling dizziness . Encouraged dietary changes to increase dietary intake of iron, Vitamin L93 and folic acid as advised/prescribed . Provided education about signs and symptoms of active bleeding and advised when to call provider  or 911 . Signs/symptoms of bleeding reviewed (weakness, exhaustion, paleness, sleepiness, blood in stool) . Encouraged to contact provider/cardiology with next INR check weekly Patient Goals/Self-Care Activities: . Continue to monitor yourself for low hemoglobin (feeling tired, exhausted, pale, shortness of breath) . Track what makes symptoms worse and what makes them better  . Contact provider for symptoms right away . Pay attention to LVAD alerts . Monitor stools for blood . Obtain home INR and call to Cardiology office for medication adjustment weekly as ordered Follow Up Plan: The care management team will reach out to the patient again over the next 20 business days.       Plan:The care management team will reach out to the patient again over the next 20 business days.  Hubert Azure  RN, MSN RN Care Management Coordinator [place] [phone] [name]

## 2021-03-25 ENCOUNTER — Ambulatory Visit: Payer: Medicare Other | Admitting: *Deleted

## 2021-03-25 DIAGNOSIS — J439 Emphysema, unspecified: Secondary | ICD-10-CM

## 2021-03-25 DIAGNOSIS — D519 Vitamin B12 deficiency anemia, unspecified: Secondary | ICD-10-CM

## 2021-03-25 DIAGNOSIS — K5520 Angiodysplasia of colon without hemorrhage: Secondary | ICD-10-CM

## 2021-03-25 DIAGNOSIS — I5032 Chronic diastolic (congestive) heart failure: Secondary | ICD-10-CM | POA: Diagnosis not present

## 2021-03-25 DIAGNOSIS — I428 Other cardiomyopathies: Secondary | ICD-10-CM | POA: Diagnosis not present

## 2021-03-25 DIAGNOSIS — Z95811 Presence of heart assist device: Secondary | ICD-10-CM | POA: Diagnosis not present

## 2021-03-25 DIAGNOSIS — Z48812 Encounter for surgical aftercare following surgery on the circulatory system: Secondary | ICD-10-CM | POA: Diagnosis not present

## 2021-03-25 DIAGNOSIS — D509 Iron deficiency anemia, unspecified: Secondary | ICD-10-CM

## 2021-03-25 DIAGNOSIS — I5043 Acute on chronic combined systolic (congestive) and diastolic (congestive) heart failure: Secondary | ICD-10-CM | POA: Diagnosis not present

## 2021-03-25 DIAGNOSIS — I10 Essential (primary) hypertension: Secondary | ICD-10-CM

## 2021-03-25 DIAGNOSIS — Z4801 Encounter for change or removal of surgical wound dressing: Secondary | ICD-10-CM | POA: Diagnosis not present

## 2021-03-25 NOTE — Chronic Care Management (AMB) (Signed)
Chronic Care Management   CCM RN Visit Note  03/25/2021 Name: Ashley [name] MRN: [id number] DOB: [birth year]  Subjective: Ashley [name] is a 70 y.o. year old female who is a primary care patient of [name], MD. The care management team was consulted for assistance with disease management and care coordination needs.    Engaged with patient by telephone for follow up visit in response to provider referral for case management and/or care coordination services.   Consent to Services:  The patient was given information about Chronic Care Management services, agreed to services, and gave verbal consent prior to initiation of services.  Please see initial visit note for detailed documentation.   Patient agreed to services and verbal consent obtained.   Assessment: Review of patient past medical history, allergies, medications, health status, including review of consultants reports, laboratory and other test data, was performed as part of comprehensive evaluation and provision of chronic care management services.   SDOH (Social Determinants of Health) assessments and interventions performed:    CCM Care Plan  Allergies  Allergen Reactions  . Vancomycin Nausea And Vomiting and Palpitations  . Hydrocodone-Acetaminophen Nausea Only    Outpatient Encounter Medications as of 03/25/2021  Medication Sig Note  . torsemide (DEMADEX) 20 MG tablet Take 20 mg by mouth daily.   . TRELEGY ELLIPTA 100-62.5-25 MCG/INH AEPB INHALE 1 PUFF BY MOUTH INTO THE LUNGS EVERY DAY   . acetaminophen (TYLENOL) 325 MG tablet Take 650 mg by mouth every 6 (six) hours as needed. (Patient not taking: Reported on 03/03/2021)   . albuterol (PROAIR HFA) 108 (90 Base) MCG/ACT inhaler Inhale 1-2 puffs into the lungs every 6 (six) hours as needed for wheezing or shortness of breath.   . allopurinol (ZYLOPRIM) 100 MG tablet TAKE ONE TABLET BY MOUTH ONCE DAILY   . ALPRAZolam (XANAX) 0.5 MG tablet TAKE 1/2 TO 1 TABLET  BY MOUTH AT BEDTIME AS NEEDED FOR ANXIETY (Patient not taking: Reported on 03/03/2021)   . amLODipine (NORVASC) 2.5 MG tablet Take 2.5 mg by mouth daily.   [name] atorvastatin (LIPITOR) 40 MG tablet Take 1 tablet (40 mg total) by mouth daily.   . carvedilol (COREG) 6.25 MG tablet Take 6.25 mg by mouth 2 (two) times daily.   . colchicine 0.6 MG tablet TAKE TWO TABLETS BY MOUTH AT FIRST SIGN OF GOUT FLARE FOLLOW BY 1 TABLET DAILY UNTIL GOUT FLARE RESOLVES (Patient not taking: Reported on 03/03/2021) 01/03/2021: Reports taking only when gout flares up  . cyanocobalamin (,VITAMIN B-12,) 1000 MCG/ML injection Inject 1 mL into the muscle monthly (Patient taking differently: Inject 1,000 mcg into the muscle every 30 (thirty) days.)   . escitalopram (LEXAPRO) 10 MG tablet TAKE ONE TABLET BY MOUTH ONCE DAILY   . ferrous sulfate 325 (65 FE) MG tablet Take 325 mg by mouth daily with breakfast.   . fluticasone (FLONASE) 50 MCG/ACT nasal spray Place 1 spray into both nostrils as needed for allergies or rhinitis.   . folic acid (FOLVITE) 1 MG tablet Take 1 tablet (1 mg total) by mouth daily.   . GNP MUCUS ER 600 MG 12 hr tablet Take 600 mg by mouth 2 (two) times daily. (Patient not taking: Reported on 03/03/2021) 01/03/2021: Reports taking as needed  . ipratropium-albuterol (DUONEB) 0.5-2.5 (3) MG/3ML SOLN USE 1 VIAL VIA NEBULIZER AND INHALE BY MOUTH INTO THE LUNGS EVERY 6 HOURS AS NEEDED (Patient not taking: Reported on 03/03/2021)   . levothyroxine (SYNTHROID) 50 MCG tablet  Take 1 tablet by mouth daily.   . magnesium oxide (MAG-OX) 400 (241.3 Mg) MG tablet Take 1 tablet by mouth every Monday, Wednesday, and Friday.   [name] omeprazole (PRILOSEC) 40 MG capsule TAKE ONE CAPSULE BY MOUTH ONCE DAILY (Patient taking differently: Take 40 mg by mouth in the morning and at bedtime.) 01/03/2021: Reports taking daily  . potassium chloride (KLOR-CON) 10 MEQ tablet Take 10 mEq by mouth every Monday, Wednesday, and Friday. Take 2 tablets  every Monday Wednesday Friday   . sacubitril-valsartan (ENTRESTO) 97-103 MG Take 1 tablet by mouth 2 (two) times daily.   [name] senna-docusate (SENOKOT-S) 8.6-50 MG tablet Take by mouth. (Patient not taking: Reported on 03/03/2021)   . traZODone (DESYREL) 50 MG tablet TAKE 1/2 TO 1 TABLET BY MOUTH EVERY EVENING 1 HOUR BEFORE BEDTIME   . warfarin (COUMADIN) 2 MG tablet Take 4 mg by mouth at bedtime. Monday, Wednesday and Friday 3 mg and AOD takes 4 mg.    No facility-administered encounter medications on file as of 03/25/2021.    Patient Active Problem List   Diagnosis Date Noted  . Sternal wound infection 12/23/2020  . Anticoagulation goal of INR 1.5 to 2.5 12/23/2020  . LVAD (left ventricular assist device) present ([place]) 12/23/2020  . Complication involving left ventricular assist device (LVAD) 12/23/2020  . Hypothyroidism 12/23/2020  . Snoring 07/05/2020  . Pressure injury of skin 01/01/2020  . CKD (chronic kidney disease), stage IIIa 12/31/2019  . Pulmonary edema cardiac cause ([place]) 12/25/2019  . Hyperlipidemia   . Hyperkalemia   . Gouty arthritis of both feet 11/20/2019  . Mitral stenosis with insufficiency   . Hypertensive urgency   . Depression   . Hyperglycemia 09/22/2019  . Presence of permanent cardiac pacemaker   . Acute blood loss anemia   . Chronic kidney disease   . Hypertension   . CHF (congestive heart failure) ([place]) 07/16/2019  . Insomnia 05/07/2019  . Suspected COVID-19 virus infection 03/23/2019  . AVM (arteriovenous malformation) of small bowel, acquired   . Anemia, iron deficiency 11/10/2018  . Rectal polyp   . Benign neoplasm of cecum   . Barrett's esophagus without dysplasia   . Chronic diastolic heart failure ([place]) 07/03/2018  . HTN (hypertension) 07/03/2018  . COPD with emphysema ([place]) 06/28/2018  . B12 deficiency anemia 06/28/2018  . Personal history of colon cancer   . Polyp of sigmoid colon   . Diverticulosis of large intestine without diverticulitis    . 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 Renal artery stenosis ([place]) 10/03/2016  . 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 Tobacco abuse counseling 10/11/2014  . Tobacco abuse 10/11/2014  . Chronic right hip pain 08/26/2014  . Atherosclerosis of native artery of extremity with intermittent claudication ([place]) 06/18/2013  . Preoperative evaluation to rule out surgical contraindication 06/18/2013  . CAD (coronary artery disease) 06/01/2013  . GERD (gastroesophageal reflux disease) 06/01/2013  . Hypercholesterolemia 06/01/2013  . Tubular adenoma of colon 06/01/2013  . Major depressive disorder in remission ([place]) 06/01/2013    Conditions to be addressed/monitored:CHF, COPD and Anemia  Care Plan : Heart Failure/COPD  Updates made by Leona Singleton, RN since 03/25/2021 12:00 AM  Problem: COPD and Heart Failure complications causing hospitalization   Priority: High  Long-Range Goal: Patient will report no COPD or heart failure exacerbations causing hospitalizations within the next 90 days.   Start Date: 01/03/2021  Expected End Date: 07/05/2021  This Visit's Progress: On track  Recent Progress: On  track  Priority: High  Current Barriers:  [name] Knowledge deficit related to self care management of CHF and COPD as evidenced by recent hospitalization related to fluid retention and pneumonia.  Reports she continues to feel well.  Denies any shortness of breath or lower extremity edema.  States she is able to get up and move around without being short of breath.  Weight this morning was 152 pounds (147-152 baseline)  Was not able to obtain blood pressure reading today, due to difficulities with dopplering on her own, even with the assistance of her fiance'. Denies any LVAD alerts.  Reports compliance with Demadex.  Reports not needing to use her rescue inhaler in the past 2 weeks.  Case Manager Clinical Goal(s):  . patient will verbalize understanding of Heart  Failure and COPD Action Plan and when to call doctor . patient will take all mediations as prescribed . patient will weigh daily and record (notifying MD of 3 lb weight gain over night or 5 lb in a week) Interventions:  . Collaboration with [name], MD regarding development and update of comprehensive plan of care as evidenced by provider attestation and co-signature . Inter-disciplinary care team collaboration (see longitudinal plan of care) . Discussed with patient when to weigh each morning after emptying bladder and placing battery in LVAD per instructions . Discussed importance of daily weight and advised patient to weigh and record daily; encouraged patient to use 2022 Calendar booklet to document weights to keep track of increase for review and provider review (instead of trying to remember from day to day) . Reviewed role of diuretics in prevention of fluid overload and management of heart failure, discussed dose change and encouraged patient to monitor symptoms and notify provider if increase in fluid retention, weight, or shortness of breath . Encouraged patient to use incentive spirometer and flutter valve,and to do coughing and deep breathing exercises . Reviewed medications and encouraged medication compliance . Encouraged patient to obtain home CO2 detectors and to contact local Fire Department to verify any programs supplying families with CO2 detectors, reviewed where patient could purchase from . Discussed COPD triggers including pollen and allergy season and action plan and when to use inhaler versus nebulizer . Encouraged increase use of nebulizer and rescue inhaler for shortness of breath to see if they help . Confirmed patient has LVAD supplies . Discussed patient monitoring blood pressures daily using doppler per cardiology request and encouraged patient to seek assistance from her fiance to help her doppler pressure and record reading . Instructed and encouraged patient  to take doppler and blood pressure cuff to next Cardiology appointment to have staff instruct her on correct use . Encouraged patient to discuss blood pressure goal (high and low) with Cardiologist . Re offered patient CCM Social work referral for grief counseling (continues to decline at this time) . Provided patient emotional support and empathy with the recent loss of her nephew  . Encouraged to keep and attend scheduled medical appointments . Reinforced with patient to contact her LVAD team for shortness of breath on day 1 or 2, not to wait until shortness of breath is too severe (day 4) for treatment options or instructions Patient Goals/Self-Care Activities . Call office if I gain more than 2 pounds in one day or 5 pounds in one week . Weigh myself daily and record in log along with LVAD daily parameters for provider review . Monitor lower extremities and abdomen for swelling . Take torsemide daily as prescribed .  Call LVAD team as soon as possible with increase in shortness of breath, do not wait days later . Take doppler and BP cuff to next cardiology appointment to have staff assist you in learning how to use equipment . Have fiance assist with monitoring/Doppler blood pressures daily . Develop a rescue plan and follow if symptoms flare up . Monitor and Eliminate symptom triggers at home . Obtain CO2 Detectors for home (contact local fire department to seek assistance in obtaining) . Use Incentive Spirometer and flutter valve and coughing and deep breathing exercises . Contact provider for increase use of rescue inhaler/nebulizer (use rescue inhaler and nebulizer for increase in shortness of breath) . Call LVAD team for increase in shortness of breath on day 2 instead of waiting days later when it is worse Follow Up Plan: The care management team will reach out to the patient again over the next 30 business days.     Problem: Recurrent GI Bleeds leading to hospitalizations   Priority:  Medium  Long-Range Goal: Patient will report no recurrent hospitalizations related to GI bleed in the next 90 days   Start Date: 01/03/2021  Expected End Date: 07/05/2021  This Visit's Progress: On track  Recent Progress: On track  Priority: Medium  Current Barriers:  [name] Knowledge deficits related to self health management of anemia or bleeding as evidenced by recent recurrent hospitalizations.  Home INR machine is still working and patient has been checking weekly and reporting to Cardiology team.  INR has been subtherapeutic (1.7) and coumadin doses are being adjusted.  Reviewed signs and symptoms of anemia with patient. Nurse Case Manager Clinical Goal(s):   patient will take all medications exactly as prescribed and will call provider for medication related questions  patient will not experience hospital admission within the next 90 days related to GI bleeding. Hospital Admissions in last 6 months =5  patient will attend all scheduled medical appointments: LVAD clinic/INR check  Interventions:  . Collaboration with [name], MD regarding development and update of comprehensive plan of care as evidenced by provider attestation and co-signature . Inter-disciplinary care team collaboration (see longitudinal plan of care) . Medications reviewed . Encouraged to continue to increase activity as tolerated . Encouraged sitting before standing and using an assistive device; reviewed and reinforced fall precautions and preventions, encouraged patient to dangle before standing and to sit back down if feeling dizziness . Encouraged dietary changes to increase dietary intake of iron, Vitamin G38 and folic acid as advised/prescribed . Provided education about signs and symptoms of active bleeding and advised when to call provider or 911 . Signs/symptoms of bleeding reviewed (weakness, exhaustion, paleness, sleepiness, blood in stool) . Encouraged to contact provider/cardiology with next INR check  weekly Patient Goals/Self-Care Activities: . Continue to monitor yourself for low hemoglobin (feeling tired, exhausted, pale, shortness of breath) . Track what makes symptoms worse and what makes them better  . Contact provider for symptoms right away . Pay attention to LVAD alerts . Monitor stools for blood . Obtain home INR and call to Cardiology office for medication adjustment weekly as ordered Follow Up Plan: The care management team will reach out to the patient again over the next 30 business days.       Plan:The care management team will reach out to the patient again over the next 30 business days.  Hubert Azure RN, MSN RN Care Management Coordinator [place] [phone] [email or web address]

## 2021-03-25 NOTE — Patient Instructions (Signed)
Visit Information  PATIENT GOALS: Goals Addressed            This Visit's Progress   . (RNCM) Track and Manage Fluids and Swelling   On track    Timeframe:  Long-Range Goal Priority:  High Start Date:  01/03/21                           Expected End Date:   07/05/21                    Follow Up Date 04/13/2021    . Call office if I gain more than 2 pounds in one day or 5 pounds in one week . Weigh myself daily and record in log along with LVAD daily parameters for provider review . Monitor lower extremities and abdomen for swelling . Take torsemide daily as prescribed . Call LVAD team as soon as possible with increase in shortness of breath, do not wait days later . Take doppler and BP cuff to next cardiology appointment to have staff assist you in learning how to use equipment . Have fiance assist with monitoring/Doppler blood pressures daily  Why is this important?   It is important to check your weight daily and watch how much salt and liquids you have.  It will help you to manage your heart failure.    Notes:     [name] (RNCM) Track and Manage My Symptoms-COPD   On track    Timeframe:  Long-Range Goal Priority:  High Start Date:  01/03/21                           Expected End Date:   07/05/21                    Follow Up Date 04/13/21    . Develop a rescue plan and follow if symptoms flare up . Monitor and Eliminate symptom triggers at home . Obtain CO2 Detectors for home (contact local fire department to seek assistance in obtaining) . Use Incentive Spirometer and flutter valve and coughing and deep breathing exercises . Contact provider for increase use of rescue inhaler/nebulizer (use rescue inhaler and nebulizer for increase in shortness of breath) . Call LVAD team for increase in shortness of breath on day 2 instead of waiting days later when it is worse   Why is this important?    Tracking your symptoms and other information about your health helps your doctor plan your  care.   Write down the symptoms, the time of day, what you were doing and what medicine you are taking.   You will soon learn how to manage your symptoms.     Notes:     [name] (RNCM) Track My Symptoms-GI Bleed   On track    Timeframe:  Long-Range Goal Priority:  Medium Start Date: 01/03/21                            Expected End Date: 07/05/21                      Follow Up Date 04/13/21   . Continue to monitor yourself for low hemoglobin (feeling tired, exhausted, pale, shortness of breath) . Track what makes symptoms worse and what makes them better  . Contact provider for symptoms right away .  Pay attention to LVAD alerts . Monitor stools for blood . Obtain home INR and call to Cardiology office for medication adjustment weekly as ordered   Why is this important?    Keeping track of symptoms that you have helps you to understand your condition. You will also learn what works to manage it.   You and your doctor will then be able to come up with the best treatment plan for you.    Notes:        Patient verbalizes understanding of instructions provided today and agrees to view in [place].   The care management team will reach out to the patient again over the next 30 business days.   Hubert Azure RN, MSN RN Care Management Coordinator [place] [phone] [email or web address]

## 2021-03-31 ENCOUNTER — Other Ambulatory Visit: Payer: Self-pay | Admitting: Internal Medicine

## 2021-04-11 ENCOUNTER — Other Ambulatory Visit: Payer: Self-pay

## 2021-04-11 MED ORDER — LEVOTHYROXINE SODIUM 50 MCG PO TABS: 50.0000 ug | ORAL_TABLET | Freq: Every day | ORAL | 1 refills | Status: DC

## 2021-04-11 NOTE — Telephone Encounter (Signed)
Historical medication. Doesn't look like it has every been filled from our office.   Last OV: 12/22/2020 Next OV: 05/06/2021

## 2021-04-13 ENCOUNTER — Ambulatory Visit (INDEPENDENT_AMBULATORY_CARE_PROVIDER_SITE_OTHER): Payer: Medicare Other | Admitting: *Deleted

## 2021-04-13 DIAGNOSIS — J439 Emphysema, unspecified: Secondary | ICD-10-CM | POA: Diagnosis not present

## 2021-04-13 DIAGNOSIS — Z7901 Long term (current) use of anticoagulants: Secondary | ICD-10-CM | POA: Diagnosis not present

## 2021-04-13 DIAGNOSIS — I5032 Chronic diastolic (congestive) heart failure: Secondary | ICD-10-CM

## 2021-04-13 DIAGNOSIS — Z952 Presence of prosthetic heart valve: Secondary | ICD-10-CM | POA: Diagnosis not present

## 2021-04-13 DIAGNOSIS — D62 Acute posthemorrhagic anemia: Secondary | ICD-10-CM | POA: Diagnosis not present

## 2021-04-13 DIAGNOSIS — K5520 Angiodysplasia of colon without hemorrhage: Secondary | ICD-10-CM

## 2021-04-13 NOTE — Chronic Care Management (AMB) (Signed)
Chronic Care Management   CCM RN Visit Note  04/13/2021 Name: Ashley [name] MRN: [id number] DOB: 04-19-1951  Subjective: Ashley [name] is a 70 y.o. year old female who is a primary care patient of [name], MD. The care management team was consulted for assistance with disease management and care coordination needs.    Engaged with patient by telephone for follow up visit in response to provider referral for case management and/or care coordination services.   Consent to Services:  The patient was given information about Chronic Care Management services, agreed to services, and gave verbal consent prior to initiation of services.  Please see initial visit note for detailed documentation.   Patient agreed to services and verbal consent obtained.   Assessment: Review of patient past medical history, allergies, medications, health status, including review of consultants reports, laboratory and other test data, was performed as part of comprehensive evaluation and provision of chronic care management services.   SDOH (Social Determinants of Health) assessments and interventions performed:    CCM Care Plan  Allergies  Allergen Reactions  . Vancomycin Nausea And Vomiting and Palpitations  . Hydrocodone-Acetaminophen Nausea Only    Outpatient Encounter Medications as of 04/13/2021  Medication Sig Note  . acetaminophen (TYLENOL) 325 MG tablet Take 650 mg by mouth every 6 (six) hours as needed. (Patient not taking: Reported on 03/03/2021)   . albuterol (PROAIR HFA) 108 (90 Base) MCG/ACT inhaler Inhale 1-2 puffs into the lungs every 6 (six) hours as needed for wheezing or shortness of breath.   . allopurinol (ZYLOPRIM) 100 MG tablet TAKE ONE TABLET BY MOUTH ONCE DAILY   . ALPRAZolam (XANAX) 0.5 MG tablet TAKE 1/2 TO 1 TABLET BY MOUTH AT BEDTIME AS NEEDED FOR ANXIETY (Patient not taking: Reported on 03/03/2021)   . amLODipine (NORVASC) 2.5 MG tablet Take 2.5 mg by mouth daily.   [name]  atorvastatin (LIPITOR) 40 MG tablet Take 1 tablet (40 mg total) by mouth daily.   . carvedilol (COREG) 6.25 MG tablet Take 6.25 mg by mouth 2 (two) times daily.   . colchicine 0.6 MG tablet TAKE TWO TABLETS BY MOUTH AT FIRST SIGN OF GOUT FLARE FOLLOW BY 1 TABLET DAILY UNTIL GOUT FLARE RESOLVES (Patient not taking: Reported on 03/03/2021) 01/03/2021: Reports taking only when gout flares up  . cyanocobalamin (,VITAMIN B-12,) 1000 MCG/ML injection Inject 1 mL into the muscle monthly (Patient taking differently: Inject 1,000 mcg into the muscle every 30 (thirty) days.)   . escitalopram (LEXAPRO) 10 MG tablet TAKE ONE TABLET BY MOUTH ONCE DAILY   . ferrous sulfate 325 (65 FE) MG tablet Take 325 mg by mouth daily with breakfast.   . fluticasone (FLONASE) 50 MCG/ACT nasal spray Place 1 spray into both nostrils as needed for allergies or rhinitis.   . folic acid (FOLVITE) 1 MG tablet TAKE ONE TABLET BY MOUTH ONCE DAILY   . GNP MUCUS ER 600 MG 12 hr tablet Take 600 mg by mouth 2 (two) times daily. (Patient not taking: Reported on 03/03/2021) 01/03/2021: Reports taking as needed  . ipratropium-albuterol (DUONEB) 0.5-2.5 (3) MG/3ML SOLN USE 1 VIAL VIA NEBULIZER AND INHALE BY MOUTH INTO THE LUNGS EVERY 6 HOURS AS NEEDED (Patient not taking: Reported on 03/03/2021)   . levothyroxine (SYNTHROID) 50 MCG tablet Take 1 tablet (50 mcg total) by mouth daily.   . magnesium oxide (MAG-OX) 400 (241.3 Mg) MG tablet Take 1 tablet by mouth every Monday, Wednesday, and Friday.   [name] omeprazole (  PRILOSEC) 40 MG capsule TAKE ONE CAPSULE BY MOUTH ONCE DAILY (Patient taking differently: Take 40 mg by mouth in the morning and at bedtime.) 01/03/2021: Reports taking daily  . potassium chloride (KLOR-CON) 10 MEQ tablet Take 10 mEq by mouth every Monday, Wednesday, and Friday. Take 2 tablets every Monday Wednesday Friday   . sacubitril-valsartan (ENTRESTO) 97-103 MG Take 1 tablet by mouth 2 (two) times daily.   [name] senna-docusate (SENOKOT-S)  8.6-50 MG tablet Take by mouth. (Patient not taking: Reported on 03/03/2021)   . torsemide (DEMADEX) 20 MG tablet Take 20 mg by mouth daily.   . traZODone (DESYREL) 50 MG tablet TAKE 1/2 TO 1 TABLET BY MOUTH EVERY EVENING 1 HOUR BEFORE BEDTIME   . TRELEGY ELLIPTA 100-62.5-25 MCG/INH AEPB INHALE 1 PUFF BY MOUTH INTO THE LUNGS EVERY DAY   . warfarin (COUMADIN) 2 MG tablet Take 4 mg by mouth at bedtime. Monday, Wednesday and Friday 3 mg and AOD takes 4 mg.    No facility-administered encounter medications on file as of 04/13/2021.    Patient Active Problem List   Diagnosis Date Noted  . Sternal wound infection 12/23/2020  . Anticoagulation goal of INR 1.5 to 2.5 12/23/2020  . LVAD (left ventricular assist device) present ([place]) 12/23/2020  . Complication involving left ventricular assist device (LVAD) 12/23/2020  . Hypothyroidism 12/23/2020  . Snoring 07/05/2020  . Pressure injury of skin 01/01/2020  . CKD (chronic kidney disease), stage IIIa 12/31/2019  . Pulmonary edema cardiac cause ([place]) 12/25/2019  . Hyperlipidemia   . Hyperkalemia   . Gouty arthritis of both feet 11/20/2019  . Mitral stenosis with insufficiency   . Hypertensive urgency   . Depression   . Hyperglycemia 09/22/2019  . Presence of permanent cardiac pacemaker   . Acute blood loss anemia   . Chronic kidney disease   . Hypertension   . CHF (congestive heart failure) ([place]) 07/16/2019  . Insomnia 05/07/2019  . Suspected COVID-19 virus infection 03/23/2019  . AVM (arteriovenous malformation) of small bowel, acquired   . Anemia, iron deficiency 11/10/2018  . Rectal polyp   . Benign neoplasm of cecum   . Barrett's esophagus without dysplasia   . Chronic diastolic heart failure ([place]) 07/03/2018  . HTN (hypertension) 07/03/2018  . COPD with emphysema ([place]) 06/28/2018  . B12 deficiency anemia 06/28/2018  . Personal history of colon cancer   . Polyp of sigmoid colon   . Diverticulosis of large intestine without  diverticulitis   . 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 Renal artery stenosis ([place]) 10/03/2016  . 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 Tobacco abuse counseling 10/11/2014  . Tobacco abuse 10/11/2014  . Chronic right hip pain 08/26/2014  . Atherosclerosis of native artery of extremity with intermittent claudication ([place]) 06/18/2013  . Preoperative evaluation to rule out surgical contraindication 06/18/2013  . CAD (coronary artery disease) 06/01/2013  . GERD (gastroesophageal reflux disease) 06/01/2013  . Hypercholesterolemia 06/01/2013  . Tubular adenoma of colon 06/01/2013  . Major depressive disorder in remission ([place]) 06/01/2013    Conditions to be addressed/monitored:CHF, COPD and Anemia  Care Plan : Heart Failure/COPD  Updates made by Leona Singleton, RN since 04/13/2021 12:00 AM  Problem: COPD and Heart Failure complications causing hospitalization   Priority: High  Long-Range Goal: Patient will report no COPD or heart failure exacerbations causing hospitalizations within the next 90 days.   Start Date: 01/03/2021  Expected End Date: 10/05/2021  This Visit's Progress: On track  Recent Progress:  On track  Priority: High  Current Barriers:  [name] Knowledge deficit related to self care management of CHF and COPD as evidenced by recent hospitalization related to fluid retention and pneumonia.  Reports she continues to feel well.  Denies any shortness of breath or lower extremity edema.  Still able to get up and move around without being short of breath.  Weight this morning was 151.3 pounds (147-152 baseline)  Still not able to obtain blood pressure reading, due to difficulities with dopplering on her own, even with the assistance of her fiance'. Denies any LVAD alerts.  Reports compliance with Demadex.  Reports not needing to use her rescue inhaler/nebulizer in the past 3 weeks.  Case Manager Clinical Goal(s):  . patient will verbalize  understanding of Heart Failure and COPD Action Plan and when to call doctor . patient will take all mediations as prescribed . patient will weigh daily and record (notifying MD of 3 lb weight gain over night or 5 lb in a week) . Patient will report no hospitalization within the next 90 days Interventions:  . Collaboration with [name], MD regarding development and update of comprehensive plan of care as evidenced by provider attestation and co-signature . Inter-disciplinary care team collaboration (see longitudinal plan of care) . Discussed with patient when to weigh each morning after emptying bladder and placing battery in LVAD per instructions . Discussed importance of daily weight and advised patient to weigh and record daily; encouraged patient to use 2022 Calendar booklet to document weights to keep track of increase for review and provider review (instead of trying to remember from day to day) . Reviewed role of diuretics in prevention of fluid overload and management of heart failure, encouraged patient to monitor symptoms and notify provider if increase in fluid retention, weight, or shortness of breath . Reviewed medications and encouraged medication compliance . Encouraged patient to obtain home CO2 detectors and to contact local Fire Department to verify any programs supplying families with CO2 detectors, reviewed where patient could purchase from . Discussed COPD triggers including pollen and allergy season and action plan and when to use inhaler versus nebulizer . Discussed use of nebulizer and rescue inhaler for shortness of breath to see if they help . Confirmed patient has LVAD supplies . Discussed patient monitoring blood pressures daily using doppler per cardiology request and encouraged patient to seek assistance from her fiance to help her doppler pressure and record reading . Instructed and encouraged patient to take doppler and blood pressure cuff to next Cardiology  appointment to have staff instruct her on correct use . Encouraged patient to discuss blood pressure goal (high and low) with Cardiologist . Re offered patient CCM Social work referral for grief counseling (continues to decline at this time) . Provided patient emotional support and empathy with the recent loss of her nephew  . Encouraged to keep and attend scheduled medical appointments . Reinforced with patient to contact her LVAD team for shortness of breath on day 1 or 2, not to wait until shortness of breath is too severe (day 4) for treatment options or instructions Patient Goals/Self-Care Activities . Call office if I gain more than 2 pounds in one day or 5 pounds in one week . Weigh myself daily and record in log along with LVAD daily parameters for provider review . Monitor lower extremities and abdomen for swelling . Take torsemide daily as prescribed . Call LVAD team as soon as possible with increase in shortness of  breath, do not wait days later . Take doppler and BP cuff to next cardiology appointment to have staff assist you in learning how to use equipment . Have fiance assist with monitoring/Doppler blood pressures daily . Develop a rescue plan and follow if symptoms flare up . Monitor and Eliminate symptom triggers at home . Obtain CO2 Detectors for home (contact local fire department to seek assistance in obtaining) . Contact provider for increase use of rescue inhaler/nebulizer (use rescue inhaler and nebulizer for increase in shortness of breath) . Call LVAD team for increase in shortness of breath on day 2 instead of waiting days later when it is worse Follow Up Plan: The care management team will reach out to the patient again over the next 30 business days.     Problem: Recurrent GI Bleeds leading to hospitalizations   Priority: Medium  Long-Range Goal: Patient will report no recurrent hospitalizations related to GI bleed in the next 90 days   Start Date: 01/03/2021   Expected End Date: 07/05/2021  This Visit's Progress: On track  Recent Progress: On track  Priority: Medium  Current Barriers:  [name] Knowledge deficits related to self health management of anemia or bleeding as evidenced by recent recurrent hospitalizations.  Home INR machine is still working and patient has been checking weekly and reporting to Cardiology team.  INR has been 1.8-2.1 and coumadin doses are being adjusted.  Reviewed signs and symptoms of anemia with patient.  Denies any signs or symptoms of blood in stool. Nurse Case Manager Clinical Goal(s):   patient will take all medications exactly as prescribed and will call provider for medication related questions  patient will not experience hospital admission within the next 90 days related to GI bleeding. Hospital Admissions in last 6 months =5  patient will attend all scheduled medical appointments: LVAD clinic/INR check  Interventions:  . Collaboration with [name], MD regarding development and update of comprehensive plan of care as evidenced by provider attestation and co-signature . Inter-disciplinary care team collaboration (see longitudinal plan of care) . Medications reviewed and encouraged medication compliance . Encouraged to continue to increase activity as tolerated . Encouraged sitting before standing and using an assistive device; reviewed and reinforced fall precautions and preventions, encouraged patient to dangle before standing and to sit back down if feeling dizziness . Encouraged dietary changes to increase dietary intake of iron, Vitamin U54 and folic acid as advised/prescribed . Provided education about signs and symptoms of active bleeding and advised when to call provider or 911 . Signs/symptoms of bleeding reviewed (weakness, exhaustion, paleness, sleepiness, blood in stool, dark tarry stools) . Encouraged to contact provider/cardiology with next INR check weekly Patient Goals/Self-Care  Activities: . Continue to monitor yourself for low hemoglobin (feeling tired, exhausted, pale, shortness of breath) . Track what makes symptoms worse and what makes them better  . Contact provider for symptoms right away . Pay attention to LVAD alerts . Monitor stools for blood . Obtain home INR and call to Cardiology office for medication adjustment weekly as ordered  Follow Up Plan: The care management team will reach out to the patient again over the next 30 business days.       Plan:The care management team will reach out to the patient again over the next 30 business days.  Hubert Azure RN, MSN RN Care Management Coordinator [place] [phone] [email or web address]

## 2021-04-13 NOTE — Patient Instructions (Signed)
Visit Information  PATIENT GOALS: Goals Addressed            This Visit's Progress   . (RNCM) Track and Manage Fluids and Swelling   Not on track    Timeframe:  Long-Range Goal Priority:  High Start Date:  01/03/21                           Expected End Date:   10/05/21                    Follow Up Date 05/11/2021    . Call office if I gain more than 2 pounds in one day or 5 pounds in one week . Weigh myself daily and record in log along with LVAD daily parameters for provider review . Monitor lower extremities and abdomen for swelling . Take torsemide daily as prescribed . Call LVAD team as soon as possible with increase in shortness of breath, do not wait days later . Take doppler and BP cuff to next cardiology appointment to have staff assist you in learning how to use equipment . Have fiance assist with monitoring/Doppler blood pressures daily  Why is this important?   It is important to check your weight daily and watch how much salt and liquids you have.  It will help you to manage your heart failure.    Notes:     [name] (RNCM) Track and Manage My Symptoms-COPD   On track    Timeframe:  Long-Range Goal Priority:  High Start Date:  01/03/21                           Expected End Date:   10/05/21                    Follow Up Date 05/11/21    . Develop a rescue plan and follow if symptoms flare up . Monitor and Eliminate symptom triggers at home . Obtain CO2 Detectors for home (contact local fire department to seek assistance in obtaining) . Contact provider for increase use of rescue inhaler/nebulizer (use rescue inhaler and nebulizer for increase in shortness of breath) . Call LVAD team for increase in shortness of breath on day 2 instead of waiting days later when it is worse   Why is this important?    Tracking your symptoms and other information about your health helps your doctor plan your care.   Write down the symptoms, the time of day, what you were doing and what  medicine you are taking.   You will soon learn how to manage your symptoms.     Notes:     [name] (RNCM) Track My Symptoms-GI Bleed   On track    Timeframe:  Long-Range Goal Priority:  Medium Start Date: 01/03/21                            Expected End Date: 07/05/21                      Follow Up Date 05/11/21   . Continue to monitor yourself for low hemoglobin (feeling tired, exhausted, pale, shortness of breath) . Track what makes symptoms worse and what makes them better  . Contact provider for symptoms right away . Pay attention to LVAD alerts . Monitor stools for blood . Obtain  home INR and call to Cardiology office for medication adjustment weekly as ordered   Why is this important?    Keeping track of symptoms that you have helps you to understand your condition. You will also learn what works to manage it.   You and your doctor will then be able to come up with the best treatment plan for you.    Notes:        Patient verbalizes understanding of instructions provided today and agrees to view in [place].   The care management team will reach out to the patient again over the next 30 business days.   Hubert Azure RN, MSN RN Care Management Coordinator [place] [phone] [email or web address]

## 2021-05-02 ENCOUNTER — Other Ambulatory Visit: Payer: Self-pay | Admitting: Internal Medicine

## 2021-05-02 DIAGNOSIS — E7800 Pure hypercholesterolemia, unspecified: Secondary | ICD-10-CM

## 2021-05-05 ENCOUNTER — Other Ambulatory Visit: Payer: Self-pay | Admitting: Internal Medicine

## 2021-05-06 ENCOUNTER — Telehealth: Payer: Self-pay | Admitting: *Deleted

## 2021-05-06 ENCOUNTER — Encounter: Payer: Self-pay | Admitting: Internal Medicine

## 2021-05-06 ENCOUNTER — Other Ambulatory Visit: Payer: Self-pay

## 2021-05-06 ENCOUNTER — Ambulatory Visit (INDEPENDENT_AMBULATORY_CARE_PROVIDER_SITE_OTHER): Payer: Medicare Other | Admitting: Internal Medicine

## 2021-05-06 VITALS — BP 98/70 | HR 70 | Temp 95.9°F | Resp 16 | Ht 63.0 in | Wt 155.8 lb

## 2021-05-06 DIAGNOSIS — I25708 Atherosclerosis of coronary artery bypass graft(s), unspecified, with other forms of angina pectoris: Secondary | ICD-10-CM | POA: Diagnosis not present

## 2021-05-06 DIAGNOSIS — E039 Hypothyroidism, unspecified: Secondary | ICD-10-CM

## 2021-05-06 DIAGNOSIS — I701 Atherosclerosis of renal artery: Secondary | ICD-10-CM

## 2021-05-06 DIAGNOSIS — N1831 Chronic kidney disease, stage 3a: Secondary | ICD-10-CM

## 2021-05-06 DIAGNOSIS — G629 Polyneuropathy, unspecified: Secondary | ICD-10-CM | POA: Diagnosis not present

## 2021-05-06 DIAGNOSIS — Z95811 Presence of heart assist device: Secondary | ICD-10-CM | POA: Diagnosis not present

## 2021-05-06 DIAGNOSIS — D509 Iron deficiency anemia, unspecified: Secondary | ICD-10-CM

## 2021-05-06 DIAGNOSIS — I5023 Acute on chronic systolic (congestive) heart failure: Secondary | ICD-10-CM | POA: Diagnosis not present

## 2021-05-06 DIAGNOSIS — F10929 Alcohol use, unspecified with intoxication, unspecified: Secondary | ICD-10-CM

## 2021-05-06 DIAGNOSIS — K5520 Angiodysplasia of colon without hemorrhage: Secondary | ICD-10-CM

## 2021-05-06 DIAGNOSIS — J441 Chronic obstructive pulmonary disease with (acute) exacerbation: Secondary | ICD-10-CM

## 2021-05-06 DIAGNOSIS — C189 Malignant neoplasm of colon, unspecified: Secondary | ICD-10-CM

## 2021-05-06 DIAGNOSIS — D519 Vitamin B12 deficiency anemia, unspecified: Secondary | ICD-10-CM

## 2021-05-06 DIAGNOSIS — F3340 Major depressive disorder, recurrent, in remission, unspecified: Secondary | ICD-10-CM

## 2021-05-06 DIAGNOSIS — D62 Acute posthemorrhagic anemia: Secondary | ICD-10-CM

## 2021-05-06 LAB — COMPREHENSIVE METABOLIC PANEL
ALT: 8 U/L (ref 0–35)
AST: 11 U/L (ref 0–37)
Albumin: 4.5 g/dL (ref 3.5–5.2)
Alkaline Phosphatase: 79 U/L (ref 39–117)
BUN: 24 mg/dL — ABNORMAL HIGH (ref 6–23)
CO2: 26 mEq/L (ref 19–32)
Calcium: 9.2 mg/dL (ref 8.4–10.5)
Chloride: 102 mEq/L (ref 96–112)
Creatinine, Ser: 1.53 mg/dL — ABNORMAL HIGH (ref 0.40–1.20)
GFR: 34.87 mL/min — ABNORMAL LOW (ref >60.00)
Glucose, Bld: 102 mg/dL — ABNORMAL HIGH (ref 70–99)
Potassium: 4.3 mEq/L (ref 3.5–5.1)
Sodium: 140 mEq/L (ref 135–145)
Total Bilirubin: 0.5 mg/dL (ref 0.2–1.2)
Total Protein: 7.1 g/dL (ref 6.0–8.3)

## 2021-05-06 LAB — HEMOGLOBIN A1C: Hgb A1c MFr Bld: 5 % (ref 4.6–6.5)

## 2021-05-06 LAB — CBC WITH DIFFERENTIAL/PLATELET
Basophils Absolute: 0.1 10*3/uL (ref 0.0–0.1)
Basophils Relative: 2.1 % (ref 0.0–3.0)
Eosinophils Absolute: 0.1 10*3/uL (ref 0.0–0.7)
Eosinophils Relative: 2.3 % (ref 0.0–5.0)
HCT: 23.2 % — CL (ref 36.0–46.0)
Hemoglobin: 7.8 g/dL — CL (ref 12.0–15.0)
Lymphocytes Relative: 18.5 % (ref 12.0–46.0)
Lymphs Abs: 1.2 10*3/uL (ref 0.7–4.0)
MCHC: 33.7 g/dL (ref 30.0–36.0)
MCV: 82.7 fl (ref 78.0–100.0)
Monocytes Absolute: 0.6 10*3/uL (ref 0.1–1.0)
Monocytes Relative: 9 % (ref 3.0–12.0)
Neutro Abs: 4.4 10*3/uL (ref 1.4–7.7)
Neutrophils Relative %: 68.1 % (ref 43.0–77.0)
Platelets: 334 10*3/uL (ref 150.0–400.0)
RBC: 2.8 Mil/uL — ABNORMAL LOW (ref 3.87–5.11)
RDW: 20 % — ABNORMAL HIGH (ref 11.5–15.5)
WBC: 6.4 10*3/uL (ref 4.0–10.5)

## 2021-05-06 LAB — TSH: TSH: 8.99 u[IU]/mL — ABNORMAL HIGH (ref 0.35–5.50)

## 2021-05-06 LAB — VITAMIN B12: Vitamin B-12: 253 pg/mL (ref 211–911)

## 2021-05-06 MED ORDER — AMITRIPTYLINE HCL 25 MG PO TABS: 25.0000 mg | ORAL_TABLET | Freq: Every day | ORAL | 0 refills | Status: DC

## 2021-05-06 NOTE — Progress Notes (Signed)
Subjective:  Patient ID: Ashley [name], female    DOB: Mar 07, 1951  Age: 70 y.o. MRN: [id number]  CC: The primary encounter diagnosis was Neuropathy. 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  HPI STARNISHA BATREZ presents for follow up  This visit occurred during the SARS-CoV-2 public health emergency.  Safety protocols were in place, including screening questions prior to the visit, additional usage of staff PPE, and extensive cleaning of exam room while observing appropriate contact time as indicated for disinfecting solutions.   Weight has been stable based on daily home weights.  Walking daily for exercises.  Walks inside her home. [name]  Appetite good. .  Gaining weight. Gradually    Thyroid was underactive during April l  admission this 14,    taking  50 mcg   daily with coffee and creamer   Neuropathy affecting soles of feet,  keeping her up at night started  several months ago   History of anemia :  reviewed last admission in Feb 2022 at [facility] for CHF    Outpatient Medications Prior to Visit  Medication Sig Dispense Refill   acetaminophen (TYLENOL) 325 MG tablet Take 650 mg by mouth every 6 (six) hours as needed.     albuterol (PROAIR HFA) 108 (90 Base)  MCG/ACT inhaler Inhale 1-2 puffs into the lungs every 6 (six) hours as needed for wheezing or shortness of breath. 8 g 2   allopurinol (ZYLOPRIM) 100 MG tablet TAKE ONE TABLET BY MOUTH ONCE DAILY 30 tablet 5   ALPRAZolam (XANAX) 0.5 MG tablet TAKE 1/2 TO 1 TABLET BY MOUTH AT BEDTIME AS NEEDED FOR ANXIETY 30 tablet 3   amLODipine (NORVASC) 2.5 MG tablet Take 2.5 mg by mouth daily.     atorvastatin (LIPITOR) 40 MG tablet TAKE ONE TABLET BY MOUTH ONCE DAILY 90 tablet 1   colchicine 0.6 MG tablet      cyanocobalamin (,VITAMIN B-12,) 1000 MCG/ML injection Inject 1 mL into the muscle monthly (Patient taking differently: Inject 1,000 mcg into the muscle every 30 (thirty) days.) 10 mL 0   escitalopram (LEXAPRO) 10 MG tablet TAKE ONE TABLET BY MOUTH ONCE DAILY 90 tablet 1   ferrous sulfate 325 (65 FE) MG tablet Take 325 mg by mouth daily with breakfast.     fluticasone (FLONASE) 50 MCG/ACT nasal spray Place 1 spray into both nostrils as needed for allergies or rhinitis.     folic acid (FOLVITE) 1 MG tablet TAKE ONE TABLET BY MOUTH ONCE DAILY 30 tablet 0   GNP MUCUS ER 600 MG 12 hr tablet Take 600 mg by mouth 2 (two) times daily.     ipratropium-albuterol (DUONEB) 0.5-2.5 (3) MG/3ML SOLN USE 1 VIAL VIA NEBULIZER AND INHALE BY MOUTH INTO THE LUNGS EVERY 6 HOURS AS NEEDED 360 mL 1   levothyroxine (SYNTHROID) 50 MCG tablet Take 1 tablet (50 mcg total) by mouth daily. 90 tablet 1  magnesium oxide (MAG-OX) 400 (241.3 Mg) MG tablet Take 1 tablet by mouth every Monday, Wednesday, and Friday.     omeprazole (PRILOSEC) 40 MG capsule TAKE ONE CAPSULE BY MOUTH ONCE DAILY (Patient taking differently: Take 40 mg by mouth in the morning and at bedtime.) 90 capsule 1   potassium chloride (KLOR-CON) 10 MEQ tablet Take 10 mEq by mouth every Monday, Wednesday, and Friday. Take 2 tablets every Monday Wednesday Friday     sacubitril-valsartan (ENTRESTO) 97-103 MG Take 1 tablet by mouth 2 (two) times daily.     senna-docusate  (SENOKOT-S) 8.6-50 MG tablet Take by mouth.     torsemide (DEMADEX) 20 MG tablet Take 20 mg by mouth daily.     TRELEGY ELLIPTA 100-62.5-25 MCG/INH AEPB INHALE 1 PUFF BY MOUTH INTO THE LUNGS EVERY DAY 60 each 2   warfarin (COUMADIN) 2 MG tablet Take 4 mg by mouth at bedtime. Monday, Wednesday and Friday 3 mg and AOD takes 4 mg.     traZODone (DESYREL) 50 MG tablet TAKE 1/2 TO 1 TABLET BY MOUTH EVERY EVENING 1 HOUR BEFORE BEDTIME 90 tablet 3   carvedilol (COREG) 6.25 MG tablet Take 6.25 mg by mouth 2 (two) times daily. (Patient not taking: Reported on 05/06/2021)     No facility-administered medications prior to visit.    Review of Systems;  Patient denies headache, fevers, malaise, unintentional weight loss, skin rash, eye pain, sinus congestion and sinus pain, sore throat, dysphagia,  hemoptysis , cough, dyspnea, wheezing, chest pain, palpitations, orthopnea, edema, abdominal pain, nausea, melena, diarrhea, constipation, flank pain, dysuria, hematuria, urinary  Frequency, nocturia, numbness, tingling, seizures,  Focal weakness, Loss of consciousness,  Tremor, insomnia, depression, anxiety, and suicidal ideation.      Objective:  BP 98/70 (BP Location: Left Arm, Patient Position: Sitting, Cuff Size: Normal)   Pulse 70   Temp (!) 95.9 F (35.5 C) (Temporal)   Resp 16   Ht _0  (1.6 m)   Wt 155 lb 12.8 oz (70.7 kg)   SpO2 99%   BMI 27.60 kg/m   BP Readings from Last 3 Encounters:  05/06/21 98/70  07/05/20 102/70  05/07/20 140/87    Wt Readings from Last 3 Encounters:  05/06/21 155 lb 12.8 oz (70.7 kg)  12/22/20 142 lb (64.4 kg)  07/05/20 166 lb 3.2 oz (75.4 kg)    General appearance: alert, cooperative and appears stated age Ears: normal TM's and external ear canals both ears Throat: lips, mucosa, and tongue normal; teeth and gums normal Neck: no adenopathy, no carotid bruit, supple, symmetrical, trachea midline and thyroid not enlarged, symmetric, no  tenderness/mass/nodules Back: symmetric, no curvature. ROM normal. No CVA tenderness. Lungs: clear to auscultation bilaterally Heart: regular rate and rhythm, S1, S2 normal, no murmur, click, rub or gallop Abdomen: soft, non-tender; bowel sounds normal; no masses,  no organomegaly Pulses: 2+ and symmetric Skin: Skin color, texture, turgor normal. No rashes or lesions Lymph nodes: Cervical, supraclavicular, and axillary nodes normal.  Lab Results  Component Value Date   HGBA1C 5.0 05/06/2021   HGBA1C 4.7 10/15/2020   HGBA1C 5.9 07/05/2020    Lab Results  Component Value Date   CREATININE 1.53 (H) 05/06/2021   CREATININE 1.00 12/24/2020   CREATININE 1.31 (H) 07/05/2020    Lab Results  Component Value Date   WBC 6.4 05/06/2021   HGB 7.8 Repeated and verified X2. (LL) 05/06/2021   HCT 23.2 Repeated and verified X2. (LL) 05/06/2021   PLT 334.0 05/06/2021  GLUCOSE 102 (H) 05/06/2021   CHOL 155 01/01/2020   TRIG 70 01/01/2020   HDL 50 01/01/2020   LDLDIRECT 62.0 11/08/2018   LDLCALC 91 01/01/2020   ALT 8 05/06/2021   AST 11 05/06/2021   NA 140 05/06/2021   K 4.3 05/06/2021   CL 102 05/06/2021   CREATININE 1.53 (H) 05/06/2021   BUN 24 (H) 05/06/2021   CO2 26 05/06/2021   TSH 8.99 (H) 05/06/2021   INR 3.2 (H) 12/24/2020   HGBA1C 5.0 05/06/2021   MICROALBUR <0.7 07/05/2020    DG Chest Port 1 View  Result Date: 12/31/2019 CLINICAL DATA:  Shortness of breath EXAM: PORTABLE CHEST 1 VIEW COMPARISON:  December 29, 2019 FINDINGS: There remains interstitial edema, somewhat increased from 2 days prior. No consolidation or pleural effusion evident. There is cardiomegaly with pulmonary venous hypertension. Pacemaker leads are attached to the right atrium, right ventricle, and coronary sinus. Patient is status post coronary artery bypass grafting. No adenopathy. There is aortic atherosclerosis. Postoperative changes in left clavicle. IMPRESSION: Increase in interstitial edema compared  to 2 days prior. No consolidation. Persistent cardiomegaly with pulmonary venous hypertension consistent with pulmonary vascular congestion. Findings appear consistent with a degree of congestive heart failure. Postoperative changes noted.  Stable appearance of pacemaker leads. Electronically Signed   By: Lowella Grip III M.D.   On: 12/31/2019 09:17   ECHOCARDIOGRAM COMPLETE  Result Date: 12/31/2019    ECHOCARDIOGRAM REPORT   Patient Name:   MIDORI DADO [facility] Date of Exam: 12/31/2019 Medical Rec #:  [id number]         Height:       63.0 in Accession #:    [id number]        Weight:       165.1 lb Date of Birth:  [birth year]         BSA:          1.782 m Patient Age:    72 years          BP:           147/98 mmHg Patient Gender: F                 HR:           103 bpm. Exam Location:  [facility] Procedure: 2D Echo, Cardiac Doppler and Color Doppler Indications:     CHF- acute diastolic 701.77  History:         Patient has prior history of Echocardiogram examinations, most                  recent 12/18/2019. CHF; Pacemaker. Mitral valve disorder, MI,                  PVD, AICD.  Sonographer:     Sherrie Sport RDCS (AE) Referring Phys:  [id number] Soledad Gerlach NIU Diagnosing Phys: Serafina Royals MD  Sonographer Comments: Technically challenging study due to limited acoustic windows. IMPRESSIONS  1. Left ventricular ejection fraction, by estimation, is 40 to 45%. The left ventricle has mildly decreased function. The left ventricle demonstrates global hypokinesis. There is moderate left ventricular hypertrophy. Left ventricular diastolic parameters are consistent with Grade I diastolic dysfunction (impaired relaxation).  2. Right ventricular systolic function is normal. The right ventricular size is normal. There is normal pulmonary artery systolic pressure.  3. Left atrial size was moderately dilated.  4. Right atrial size was mildly dilated.  5. 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  The mitral valve  is abnormal. Moderate mitral valve regurgitation.  6. The aortic valve is normal in structure and function. Aortic valve regurgitation is trivial. FINDINGS  Left Ventricle: Left ventricular ejection fraction, by estimation, is 40 to 45%. The left ventricle has mildly decreased function. The left ventricle demonstrates global hypokinesis. The left ventricular internal cavity size was normal in size. There is  moderate left ventricular hypertrophy. Left ventricular diastolic parameters are consistent with Grade I diastolic dysfunction (impaired relaxation). Right Ventricle: The right ventricular size is normal. No increase in right ventricular wall thickness. Right ventricular systolic function is normal. There is normal pulmonary artery systolic pressure. The tricuspid regurgitant velocity is 1.82 m/s, and  with an assumed right atrial pressure of 10 mmHg, the estimated right ventricular systolic pressure is 48.5 mmHg. Left Atrium: Left atrial size was moderately dilated. Right Atrium: Right atrial size was mildly dilated. Pericardium: There is no evidence of pericardial effusion. 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 The mitral valve is abnormal. Moderate mitral valve regurgitation. MV peak gradient, 25.8 mmHg. The mean mitral valve gradient is 13.0 mmHg. Tricuspid Valve: The tricuspid valve is normal in structure. Tricuspid valve regurgitation is mild. Aortic Valve: The aortic valve is normal in structure and function. Aortic valve regurgitation is trivial. Aortic valve mean gradient measures 7.4 mmHg. Aortic valve peak gradient measures 12.0 mmHg. Aortic valve area, by VTI measures 1.66 cm. Pulmonic Valve: The pulmonic valve was normal in structure. Pulmonic valve regurgitation is trivial. Aorta: The aortic root and ascending aorta are structurally normal, with no evidence of dilitation. IAS/Shunts: No atrial level shunt detected by color flow Doppler.  LEFT  VENTRICLE PLAX 2D LVIDd:         4.20 cm  Diastology LVIDs:         3.08 cm  LV e' lateral:   4.03 cm/s LV PW:         1.85 cm  LV E/e' lateral: 44.2 LV IVS:        1.76 cm  LV e' medial:    2.83 cm/s LVOT diam:     2.00 cm  LV E/e' medial:  62.9 LV SV:         50 LV SV Index:   28 LVOT Area:     3.14 cm  RIGHT VENTRICLE RV Basal diam:  3.14 cm LEFT ATRIUM              Index       RIGHT ATRIUM           Index LA diam:        5.20 cm  2.92 cm/m  RA Area:     19.00 cm LA Vol (A2C):   149.0 ml 83.59 ml/m RA Volume:   54.70 ml  30.69 ml/m LA Vol (A4C):   87.6 ml  49.15 ml/m LA Biplane Vol: 121.0 ml 67.89 ml/m  AORTIC VALVE                    PULMONIC VALVE AV Area (Vmax):    1.11 cm     PV Vmax:        0.78 m/s AV Area (Vmean):   1.09 cm     PV Peak grad:   2.4 mmHg AV Area (VTI):     1.66 cm     RVOT Peak grad: 3 mmHg AV Vmax:           173.00 cm/s  AV Vmean:          123.800 cm/s AV VTI:            0.300 m AV Peak Grad:      12.0 mmHg AV Mean Grad:      7.4 mmHg LVOT Vmax:         61.10 cm/s LVOT Vmean:        42.900 cm/s LVOT VTI:          0.159 m LVOT/AV VTI ratio: 0.53  AORTA Ao Root diam: 2.90 cm MITRAL VALVE                TRICUSPID VALVE MV Area (PHT): 2.93 cm     TR Peak grad:   13.2 mmHg MV Peak grad:  25.8 mmHg    TR Vmax:        182.00 cm/s MV Mean grad:  13.0 mmHg MV Vmax:       2.54 m/s     SHUNTS MV Vmean:      162.0 cm/s   Systemic VTI:  0.16 m MV Decel Time: 259 msec     Systemic Diam: 2.00 cm MV E velocity: 178.00 cm/s MV A velocity: 158.00 cm/s MV E/A ratio:  1.13 Serafina Royals MD Electronically signed by Serafina Royals MD Signature Date/Time: 12/31/2019/5:32:15 PM    Final     Assessment & Plan:   Problem List Items Addressed This Visit       Unprioritized   Acute blood loss anemia    She had a drop in hgb to 5.5 on jan 20 resulting in admission to [facility] for stabilization.  She underwent transfusion of a total of 6 units.  INR was 4.1 and reversed.  Colonoscopy noted a single  angioectasia .  hgb at discharge was. 8.6 on jan 2 and rose to 9.9 on jan 31   Recheck today is low again; hgb< 8   Given her severe cardiomyopathy, recommended that she return to [facility]   Lab Results  Component Value Date   WBC 6.4 05/06/2021   HGB 7.8 Repeated and verified X2. (LL) 05/06/2021   HCT 23.2 Repeated and verified X2. (LL) 05/06/2021   MCV 82.7 05/06/2021   PLT 334.0 05/06/2021          RESOLVED: Alcoholic intoxication with complication (HCC)   Anemia, iron deficiency   Relevant Orders   CBC with Differential/Platelet (Completed)   Iron, TIBC and Ferritin Panel (Completed)   AVM (arteriovenous malformation) of small bowel, acquired   B12 deficiency anemia      Lab Results  Component Value Date   [id number] 627 07/05/2020   Lab Results  Component Value Date   HGBA1C 4.7 10/15/2020          CAD (coronary artery disease)   CHF (congestive heart failure) (HCC)   Chronic kidney disease   Relevant Orders   Comprehensive metabolic panel (Completed)   Depression   Relevant Medications   amitriptyline (ELAVIL) 25 MG tablet   Hypothyroidism   Relevant Orders   TSH (Completed)   LVAD (left ventricular assist device) present ([place])   Malignant neoplasm of colon, unspecified part of colon ([place])   Neuropathy - Primary    etiolology unclear but may be multifactorial:  Years of undertreated b12 deficiency,,alcohol abuse,  Also has well water and h/o diabetes.         Relevant Orders   Vitamin B12 (Completed)   Hemoglobin A1c (Completed)   Heavy Metals  Profile, Urine   Renal artery stenosis ([place])   Other Visit Diagnoses     COPD exacerbation ([place])   (Chronic)     Relevant Orders   TSH (Completed)      I spent 30 minutes dedicated to the care of this patient on the date of this encounter to include pre-visit review of her medical history,  recent hospitalization , in person time with the patient , and post visit ordering of testing and therapeutics.   I  have discontinued Mauriana A. Callaham "Debbie"'s traZODone. I am also having her start on amitriptyline. Additionally, I am having her maintain her cyanocobalamin, torsemide, acetaminophen, colchicine, ALPRAZolam, potassium chloride, warfarin, GNP Mucus ER, omeprazole, allopurinol, Trelegy Ellipta, ferrous sulfate, fluticasone, amLODipine, magnesium oxide, sacubitril-valsartan, senna-docusate, albuterol, ipratropium-albuterol, carvedilol, folic acid, levothyroxine, atorvastatin, and escitalopram.  Meds ordered this encounter  Medications   amitriptyline (ELAVIL) 25 MG tablet    Sig: Take 1 tablet (25 mg total) by mouth at bedtime.    Dispense:  90 tablet    Refill:  0    Medications Discontinued During This Encounter  Medication Reason   traZODone (DESYREL) 50 MG tablet     Follow-up: Return in about 6 months (around 11/06/2021).   [name], MD

## 2021-05-06 NOTE — Patient Instructions (Addendum)
Your foot pain is from "neuropathy"  (irritated nerve endings,  cause is unclear)  The 24 hour urine collection will rule out heavy metals as the cause ( from use of well water )  B12 and thyroid deficiency,, alcohol abuse and diabetes also can cause it.    I am recommending a trial of  amitriptyline  ("elavil") for your neuropathy  Stop the trazodone when you start the amitriptyline   Start  with 25 mg at bedtime or one hour before bedtime   Increase to 50 mg after one week if there is no change in neuropathy  You can continue to Increase by 25 mg per week,  max dose 150 mg

## 2021-05-06 NOTE — Assessment & Plan Note (Signed)
   Lab Results  Component Value Date   [id number] 220 07/05/2020   Lab Results  Component Value Date   HGBA1C 4.7 10/15/2020

## 2021-05-06 NOTE — Telephone Encounter (Signed)
Spoke with pt and informed her of her lab results. Pt gave a verbal understanding and stated that she would go to [place] instead of [facility].

## 2021-05-06 NOTE — Telephone Encounter (Signed)
Her hemoglobin has dropped 4 points since her discharge from [facility] in April.  She needs to go to the ER to have itrechecked,  because she may need to be admitted for a transfusion to keep her from going into heart failure.

## 2021-05-06 NOTE — Assessment & Plan Note (Signed)
etiolology unclear but may be multifactorial:  Years of undertreated b12 deficiency,,alcohol abuse,  Also has well water and h/o diabetes.

## 2021-05-06 NOTE — Telephone Encounter (Signed)
CRITICAL VALUE STICKER  CRITICAL VALUE: Hemoglobin: 7.8 & Hct: 23.2  RECEIVER (on-site recipient of call): Jari Favre, CMA  DATE & TIME NOTIFIED: 05/06/21 @ 3pm  MESSENGER (representative from lab): Santiago Glad  MD NOTIFIED: Dr. Derrel Nip  TIME OF NOTIFICATION: 3:09pm  RESPONSE:

## 2021-05-07 LAB — IRON,TIBC AND FERRITIN PANEL
%SAT: 4 % (calc) — ABNORMAL LOW (ref 16–45)
Ferritin: 20 ng/mL (ref 16–288)
Iron: 18 ug/dL — ABNORMAL LOW (ref 45–160)
TIBC: 467 mcg/dL (calc) — ABNORMAL HIGH (ref 250–450)

## 2021-05-08 NOTE — Assessment & Plan Note (Addendum)
She had a drop in hgb to 5.5 on jan 20 resulting in admission to [facility] for stabilization.  She underwent transfusion of a total of 6 units.  INR was 4.1 and reversed.  Colonoscopy noted a single angioectasia .  hgb at discharge was. 8.6 on jan 2 and rose to 9.9 on jan 31   Recheck today is low again; hgb< 8   Given her severe cardiomyopathy, recommended that she return to [facility]   Lab Results  Component Value Date   WBC 6.4 05/06/2021   HGB 7.8 Repeated and verified X2. (LL) 05/06/2021   HCT 23.2 Repeated and verified X2. (LL) 05/06/2021   MCV 82.7 05/06/2021   PLT 334.0 05/06/2021

## 2021-05-10 ENCOUNTER — Ambulatory Visit (INDEPENDENT_AMBULATORY_CARE_PROVIDER_SITE_OTHER): Payer: Medicare Other

## 2021-05-10 VITALS — Ht 63.0 in | Wt 155.0 lb

## 2021-05-10 DIAGNOSIS — Z0000 Encounter for general adult medical examination without abnormal findings: Secondary | ICD-10-CM

## 2021-05-10 NOTE — Patient Instructions (Addendum)
Ashley [name] , Thank you for taking time to come for your Medicare Wellness Visit. I appreciate your ongoing commitment to your health goals. Please review the following plan we discussed and let me know if I can assist you in the future.   These are the goals we discussed:  Goals       Patient Stated     PharmD - Medication Monitoring (pt-stated)      Patient Goals/Self-Care Activities Over the next 90 days, patient will:  - take medications as prescribed check blood pressure periodically, document, and provide at future appointments weigh daily, and contact provider if weight gain of >3 lbs/day, > 5 lbs/week       Other     (RNCM) Track and Manage Fluids and Swelling      Timeframe:  Long-Range Goal Priority:  High Start Date:  01/03/21                           Expected End Date:   10/05/21                    Follow Up Date 05/11/2021    Call office if I gain more than 2 pounds in one day or 5 pounds in one week Weigh myself daily and record in log along with [facility] daily parameters for provider review Monitor lower extremities and abdomen for swelling Take torsemide daily as prescribed Call LVAD team as soon as possible with increase in shortness of breath, do not wait days later Take doppler and BP cuff to next cardiology appointment to have staff assist you in learning how to use equipment Have fiance assist with monitoring/Doppler blood pressures daily  Why is this important?   It is important to check your weight daily and watch how much salt and liquids you have.  It will help you to manage your heart failure.    Notes:        (RNCM) Track and Manage My Symptoms-COPD      Timeframe:  Long-Range Goal Priority:  High Start Date:  01/03/21                           Expected End Date:   10/05/21                    Follow Up Date 05/11/21    Develop a rescue plan and follow if symptoms flare up Monitor and Eliminate symptom triggers at home [place] for  home (contact local fire department to seek assistance in obtaining) Contact provider for increase use of rescue inhaler/nebulizer (use rescue inhaler and nebulizer for increase in shortness of breath) Call [facility]AD team for increase in shortness of breath on day 2 instead of waiting days later when it is worse   Why is this important?   Tracking your symptoms and other information about your health helps your doctor plan your care.  Write down the symptoms, the time of day, what you were doing and what medicine you are taking.  You will soon learn how to manage your symptoms.     Notes:        (RNCM) Track My Symptoms-GI Bleed      Timeframe:  Long-Range Goal Priority:  Medium Start Date: 01/03/21  Expected End Date: 07/05/21                      Follow Up Date 05/11/21   Continue to monitor yourself for low hemoglobin (feeling tired, exhausted, pale, shortness of breath) Track what makes symptoms worse and what makes them better  Contact provider for symptoms right away Pay attention to LVAD alerts Monitor stools for blood Obtain home INR and call to Cardiology office for medication adjustment weekly as ordered   Why is this important?   Keeping track of symptoms that you have helps you to understand your condition. You will also learn what works to manage it.  You and your doctor will then be able to come up with the best treatment plan for you.    Notes:        Increase physical activity      Stay active         This is a list of the screening recommended for you and due dates:  Health Maintenance  Topic Date Due   Eye exam for diabetics  05/10/2021*   Zoster (Shingles) Vaccine (1 of 2) 08/10/2021*   Mammogram  05/10/2022*   DEXA scan (bone density measurement)  05/10/2022*   Tetanus Vaccine  05/10/2022*   Pneumonia vaccines (2 of 2 - PCV13) 05/10/2022*   Flu Shot  06/06/2021   COVID-19 Vaccine (4 - Booster for Janssen series) 07/02/2021    Complete foot exam   07/05/2021   Hemoglobin A1C  11/06/2021   Colon Cancer Screening  07/25/2023   Hepatitis C Screening: USPSTF Recommendation to screen - Ages 18-79 yo.  Completed   HPV Vaccine  Aged Out  *Topic was postponed. The date shown is not the original due date.    Advanced directives: not yet completed  Conditions/risks identified: none new  Follow up in one year for your annual wellness visit    Preventive Care 65 Years and Older, Female Preventive care refers to lifestyle choices and visits with your health care provider that can promote health and wellness. What does preventive care include? A yearly physical exam. This is also called an annual well check. Dental exams once or twice a year. Routine eye exams. Ask your health care provider how often you should have your eyes checked. Personal lifestyle choices, including: Daily care of your teeth and gums. Regular physical activity. Eating a healthy diet. Avoiding tobacco and drug use. Limiting alcohol use. Practicing safe sex. Taking low-dose aspirin every day. Taking vitamin and mineral supplements as recommended by your health care provider. What happens during an annual well check? The services and screenings done by your health care provider during your annual well check will depend on your age, overall health, lifestyle risk factors, and family history of disease. Counseling  Your health care provider may ask you questions about your: Alcohol use. Tobacco use. Drug use. Emotional well-being. Home and relationship well-being. Sexual activity. Eating habits. History of falls. Memory and ability to understand (cognition). Work and work Statistician. Reproductive health. Screening  You may have the following tests or measurements: Height, weight, and BMI. Blood pressure. Lipid and cholesterol levels. These may be checked every 5 years, or more frequently if you are over 67 years old. Skin check. Lung  cancer screening. You may have this screening every year starting at age 34 if you have a 30-pack-year history of smoking and currently smoke or have quit within the past 15 years. Fecal occult blood test (  FOBT) of the stool. You may have this test every year starting at age 64. Flexible sigmoidoscopy or colonoscopy. You may have a sigmoidoscopy every 5 years or a colonoscopy every 10 years starting at age 44. Hepatitis C blood test. Hepatitis B blood test. Sexually transmitted disease (STD) testing. Diabetes screening. This is done by checking your blood sugar (glucose) after you have not eaten for a while (fasting). You may have this done every 1-3 years. Bone density scan. This is done to screen for osteoporosis. You may have this done starting at age 36. Mammogram. This may be done every 1-2 years. Talk to your health care provider about how often you should have regular mammograms. Talk with your health care provider about your test results, treatment options, and if necessary, the need for more tests. Vaccines  Your health care provider may recommend certain vaccines, such as: Influenza vaccine. This is recommended every year. Tetanus, diphtheria, and acellular pertussis (Tdap, Td) vaccine. You may need a Td booster every 10 years. Zoster vaccine. You may need this after age 2. Pneumococcal 13-valent conjugate (PCV13) vaccine. One dose is recommended after age 24. Pneumococcal polysaccharide (PPSV23) vaccine. One dose is recommended after age 62. Talk to your health care provider about which screenings and vaccines you need and how often you need them. This information is not intended to replace advice given to you by your health care provider. Make sure you discuss any questions you have with your health care provider. Document Released: 11/19/2015 Document Revised: 07/12/2016 Document Reviewed: 08/24/2015 Elsevier Interactive Patient Education  2017 [employer] Prevention in  the Home Falls can cause injuries. They can happen to people of all ages. There are many things you can do to make your home safe and to help prevent falls. What can I do on the outside of my home? Regularly fix the edges of walkways and driveways and fix any cracks. Remove anything that might make you trip as you walk through a door, such as a raised step or threshold. Trim any bushes or trees on the path to your home. Use bright outdoor lighting. Clear any walking paths of anything that might make someone trip, such as rocks or tools. Regularly check to see if handrails are loose or broken. Make sure that both sides of any steps have handrails. Any raised decks and porches should have guardrails on the edges. Have any leaves, snow, or ice cleared regularly. Use sand or salt on walking paths during winter. Clean up any spills in your garage right away. This includes oil or grease spills. What can I do in the bathroom? Use night lights. Install grab bars by the toilet and in the tub and shower. Do not use towel bars as grab bars. Use non-skid mats or decals in the tub or shower. If you need to sit down in the shower, use a plastic, non-slip stool. Keep the floor dry. Clean up any water that spills on the floor as soon as it happens. Remove soap buildup in the tub or shower regularly. Attach bath mats securely with double-sided non-slip rug tape. Do not have throw rugs and other things on the floor that can make you trip. What can I do in the bedroom? Use night lights. Make sure that you have a light by your bed that is easy to reach. Do not use any sheets or blankets that are too big for your bed. They should not hang down onto the floor. Have a firm  chair that has side arms. You can use this for support while you get dressed. Do not have throw rugs and other things on the floor that can make you trip. What can I do in the kitchen? Clean up any spills right away. Avoid walking on wet  floors. Keep items that you use a lot in easy-to-reach places. If you need to reach something above you, use a strong step stool that has a grab bar. Keep electrical cords out of the way. Do not use floor polish or wax that makes floors slippery. If you must use wax, use non-skid floor wax. Do not have throw rugs and other things on the floor that can make you trip. What can I do with my stairs? Do not leave any items on the stairs. Make sure that there are handrails on both sides of the stairs and use them. Fix handrails that are broken or loose. Make sure that handrails are as long as the stairways. Check any carpeting to make sure that it is firmly attached to the stairs. Fix any carpet that is loose or worn. Avoid having throw rugs at the top or bottom of the stairs. If you do have throw rugs, attach them to the floor with carpet tape. Make sure that you have a light switch at the top of the stairs and the bottom of the stairs. If you do not have them, ask someone to add them for you. What else can I do to help prevent falls? Wear shoes that: Do not have high heels. Have rubber bottoms. Are comfortable and fit you well. Are closed at the toe. Do not wear sandals. If you use a stepladder: Make sure that it is fully opened. Do not climb a closed stepladder. Make sure that both sides of the stepladder are locked into place. Ask someone to hold it for you, if possible. Clearly mark and make sure that you can see: Any grab bars or handrails. First and last steps. Where the edge of each step is. Use tools that help you move around (mobility aids) if they are needed. These include: Canes. Walkers. Scooters. Crutches. Turn on the lights when you go into a dark area. Replace any light bulbs as soon as they burn out. Set up your furniture so you have a clear path. Avoid moving your furniture around. If any of your floors are uneven, fix them. If there are any pets around you, be aware of  where they are. Review your medicines with your doctor. Some medicines can make you feel dizzy. This can increase your chance of falling. Ask your doctor what other things that you can do to help prevent falls. This information is not intended to replace advice given to you by your health care provider. Make sure you discuss any questions you have with your health care provider. Document Released: 08/19/2009 Document Revised: 03/30/2016 Document Reviewed: 11/27/2014 Elsevier Interactive Patient Education  2017 [employer].

## 2021-05-10 NOTE — Progress Notes (Addendum)
Subjective:   RUTA CAPECE is a 70 y.o. female who presents for Medicare Annual (Subsequent) preventive examination.  Review of Systems    No ROS.  Medicare Wellness Virtual Visit.  Visual/audio telehealth visit, UTA vital signs.   See social history for additional risk factors.   Cardiac Risk Factors include: advanced age (>71men, >64 women)  Late entry due to systemic issues.      Objective:    Today's Vitals   05/10/21 0910  Weight: 155 lb (70.3 kg)  Height: 5\' 3"  (1.6 m)   Body mass index is 27.46 kg/m.  Advanced Directives 05/10/2021 02/23/2021 08/16/2020 05/17/2020 05/07/2020 01/09/2020 12/31/2019  Does Patient Have a Medical Advance Directive? Yes No Yes Yes Yes Yes No  Type of Advance Directive Out of facility DNR (pink MOST or yellow form) - Press photographer;Living will [place];Living will Out of facility DNR (pink MOST or yellow form) [place] -  Does patient want to make changes to medical advance directive? - - No - Patient declined No - Patient declined No - Patient declined No - Patient declined -  Would patient like information on creating a medical advance directive? - No - Patient declined - - - No - Patient declined No - Patient declined    Current Medications (verified) Outpatient Encounter Medications as of 05/10/2021  Medication Sig   acetaminophen (TYLENOL) 325 MG tablet Take 650 mg by mouth every 6 (six) hours as needed.   albuterol (PROAIR HFA) 108 (90 Base) MCG/ACT inhaler Inhale 1-2 puffs into the lungs every 6 (six) hours as needed for wheezing or shortness of breath.   allopurinol (ZYLOPRIM) 100 MG tablet TAKE ONE TABLET BY MOUTH ONCE DAILY   ALPRAZolam (XANAX) 0.5 MG tablet TAKE 1/2 TO 1 TABLET BY MOUTH AT BEDTIME AS NEEDED FOR ANXIETY   amitriptyline (ELAVIL) 25 MG tablet Take 1 tablet (25 mg total) by mouth at bedtime.   amLODipine (NORVASC) 2.5 MG tablet Take 2.5 mg by mouth daily.   atorvastatin  (LIPITOR) 40 MG tablet TAKE ONE TABLET BY MOUTH ONCE DAILY   carvedilol (COREG) 6.25 MG tablet Take 6.25 mg by mouth 2 (two) times daily. (Patient not taking: Reported on 05/06/2021)   colchicine 0.6 MG tablet    cyanocobalamin (,VITAMIN B-12,) 1000 MCG/ML injection Inject 1 mL into the muscle monthly (Patient taking differently: Inject 1,000 mcg into the muscle every 30 (thirty) days.)   escitalopram (LEXAPRO) 10 MG tablet TAKE ONE TABLET BY MOUTH ONCE DAILY   ferrous sulfate 325 (65 FE) MG tablet Take 325 mg by mouth daily with breakfast.   fluticasone (FLONASE) 50 MCG/ACT nasal spray Place 1 spray into both nostrils as needed for allergies or rhinitis.   folic acid (FOLVITE) 1 MG tablet TAKE ONE TABLET BY MOUTH ONCE DAILY   GNP MUCUS ER 600 MG 12 hr tablet Take 600 mg by mouth 2 (two) times daily.   ipratropium-albuterol (DUONEB) 0.5-2.5 (3) MG/3ML SOLN USE 1 VIAL VIA NEBULIZER AND INHALE BY MOUTH INTO THE LUNGS EVERY 6 HOURS AS NEEDED   levothyroxine (SYNTHROID) 50 MCG tablet Take 1 tablet (50 mcg total) by mouth daily.   magnesium oxide (MAG-OX) 400 (241.3 Mg) MG tablet Take 1 tablet by mouth every Monday, Wednesday, and Friday.   omeprazole (PRILOSEC) 40 MG capsule TAKE ONE CAPSULE BY MOUTH ONCE DAILY (Patient taking differently: Take 40 mg by mouth in the morning and at bedtime.)   potassium chloride (KLOR-CON)  10 MEQ tablet Take 10 mEq by mouth every Monday, Wednesday, and Friday. Take 2 tablets every Monday Wednesday Friday   sacubitril-valsartan (ENTRESTO) 97-103 MG Take 1 tablet by mouth 2 (two) times daily.   senna-docusate (SENOKOT-S) 8.6-50 MG tablet Take by mouth.   torsemide (DEMADEX) 20 MG tablet Take 20 mg by mouth daily.   TRELEGY ELLIPTA 100-62.5-25 MCG/INH AEPB INHALE 1 PUFF BY MOUTH INTO THE LUNGS EVERY DAY   warfarin (COUMADIN) 2 MG tablet Take 4 mg by mouth at bedtime. Monday, Wednesday and Friday 3 mg and AOD takes 4 mg.   No facility-administered encounter medications on  file as of 05/10/2021.    Allergies (verified) Vancomycin and Hydrocodone-acetaminophen   History: Past Medical History:  Diagnosis Date   AICD (automatic cardioverter/defibrillator) present    on right side   Anemia    CAD (coronary artery disease)    s/p CABG   CHF (congestive heart failure) (HCC)    Chronic kidney disease    renal artery stenosis   Colon cancer (HCC)    Depression    GERD (gastroesophageal reflux disease)    Hx of colonic polyps    Hyperlipidemia    Hypertension    Mitral valve disorder    s/p mitral valve repair wth CABG   Myocardial infarction [facility])    Peripheral vascular disease ([place])    Presence of permanent cardiac pacemaker    Pacemaker/ Defibrillator   Tobacco abuse 10/11/2014   Past Surgical History:  Procedure Laterality Date   arm surgery     fracture, has plates and screws   CABG with mitral valve repair     CARDIAC CATHETERIZATION     COLON SURGERY     colon cancer   COLONOSCOPY WITH PROPOFOL N/A 10/09/2016   Procedure: COLONOSCOPY WITH PROPOFOL;  Surgeon: Jonathon Bellows, MD;  Location: [facility] ENDOSCOPY;  Service: Endoscopy;  Laterality: N/A;   COLONOSCOPY WITH PROPOFOL N/A 07/24/2018   Procedure: COLONOSCOPY WITH PROPOFOL;  Surgeon: Virgel Manifold, MD;  Location: [facility] ENDOSCOPY;  Service: Endoscopy;  Laterality: N/A;   CORONARY ARTERY BYPASS GRAFT     ENTEROSCOPY N/A 11/21/2018   Procedure: ENTEROSCOPY-BALLOON;  Surgeon: Jonathon Bellows, MD;  Location: [facility] ENDOSCOPY;  Service: Gastroenterology;  Laterality: N/A;   ESOPHAGOGASTRODUODENOSCOPY (EGD) WITH PROPOFOL N/A 07/24/2018   Procedure: ESOPHAGOGASTRODUODENOSCOPY (EGD) WITH PROPOFOL;  Surgeon: Virgel Manifold, MD;  Location: [facility] ENDOSCOPY;  Service: Endoscopy;  Laterality: N/A;   GIVENS CAPSULE STUDY N/A 08/16/2018   Procedure: GIVENS CAPSULE STUDY;  Surgeon: Virgel Manifold, MD;  Location: [facility] ENDOSCOPY;  Service: Endoscopy;  Laterality: N/A;   pace maker defib  2016   PERIPHERAL  VASCULAR CATHETERIZATION N/A 10/23/2016   Procedure: Renal Angiography;  Surgeon: Algernon Huxley, MD;  Location: [facility];  Service: Cardiovascular;  Laterality: N/A;   RIGHT/LEFT HEART CATH AND CORONARY ANGIOGRAPHY N/A 08/26/2019   Procedure: RIGHT/LEFT HEART CATH AND CORONARY ANGIOGRAPHY;  Surgeon: Isaias Cowman, MD;  Location: [facility];  Service: Cardiovascular;  Laterality: N/A;   TOTAL ABDOMINAL HYSTERECTOMY  1999   history of abnormal pap   Family History  Problem Relation Age of Onset   Arthritis Mother    Cancer Mother        uterus cancer   Hyperlipidemia Mother    Hypertension Mother    Heart disease Mother    Diabetes Mother    Hyperlipidemia Father    Hypertension Father    Heart disease Father  Diabetes Father    Cancer Sister        ovary cancer   Diabetes Maternal Grandmother    Hypertension Maternal Grandmother    Arthritis Maternal Grandmother    Hypertension Maternal Grandfather    Hypertension Paternal Grandmother    Hypertension Paternal Grandfather    Heart disease Paternal Grandfather    Heart disease Brother    Kidney disease Brother    Breast cancer Neg Hx    Social History   Socioeconomic History   Marital status: Single    Spouse name: Not on file   Number of children: 0   Years of education: 11   Highest education level: 11th grade  Occupational History   Occupation: disabled  Tobacco Use   Smoking status: Former    Packs/day: 0.25    Pack years: 0.00    Types: Cigarettes    Quit date: 06/19/2019    Years since quitting: 1.8   Smokeless tobacco: Never   Tobacco comments:    reports smoking 2-3 cigarettes/ day  Vaping Use   Vaping Use: Never used  Substance and Sexual Activity   Alcohol use: Yes    Alcohol/week: 2.0 standard drinks    Types: 2 Shots of liquor per week    Comment: occasion   Drug use: No   Sexual activity: Yes    Comment: 1 partner  Other Topics Concern   Not on file  Social History  Narrative   07/01/2019: Patient reports she has stopped smoking completely since she was hospitalized on June 19, 2019   Social Determinants of Health   Financial Resource Strain: Low Risk    Difficulty of Paying Living Expenses: Not hard at all  Food Insecurity: No Food Insecurity   Worried About Charity fundraiser in the Last Year: Never true   Arboriculturist in the Last Year: Never true  Transportation Needs: No Transportation Needs   Lack of Transportation (Medical): No   Lack of Transportation (Non-Medical): No  Physical Activity: Inactive   Days of Exercise per Week: 0 days   Minutes of Exercise per Session: 0 min  Stress: No Stress Concern Present   Feeling of Stress : Not at all  Social Connections: Moderately Isolated   Frequency of Communication with Friends and Family: More than three times a week   Frequency of Social Gatherings with Friends and Family: More than three times a week   Attends Religious Services: Never   Marine scientist or Organizations: No   Attends Music therapist: Never   Marital Status: Married    Tobacco Counseling Counseling given: Not Answered Tobacco comments: reports smoking 2-3 cigarettes/ day   Clinical Intake:  Pre-visit preparation completed: Yes        Diabetes: No  How often do you need to have someone help you when you read instructions, pamphlets, or other written materials from your doctor or pharmacy?: 1 - Never    Interpreter Needed?: No      Activities of Daily Living In your present state of health, do you have any difficulty performing the following activities: 05/10/2021  Hearing? N  Vision? N  Difficulty concentrating or making decisions? N  Walking or climbing stairs? N  Dressing or bathing? N  Doing errands, shopping? N  Preparing Food and eating ? N  Using the Toilet? N  In the past six months, have you accidently leaked urine? N  Do you have problems with loss of  bowel control?  N  Managing your Medications? N  Managing your Finances? N  Housekeeping or managing your Housekeeping? N  Some recent data might be hidden    Patient Care Team: [name], MD as PCP - General (Internal Medicine) Isaias Cowman, MD as Consulting Physician (Cardiology) De Hollingshead, RPH-CPP as Pharmacist (Pharmacist) Edrick Kins, MD as Rounding Team (Internal Medicine) Leona Singleton, RN as Case Manager  Indicate any recent Medical Services you may have received from other than [facility] providers in the past year (date may be approximate).     Assessment:   This is a routine wellness examination for Fidencia.  I connected with Sheriece today by telephone and verified that I am speaking with the correct person using two identifiers. Location patient: home Location provider: work Persons participating in the virtual visit: patient, Marine scientist.    I discussed the limitations, risks, security and privacy concerns of performing an evaluation and management service by telephone and the availability of in person appointments. The patient expressed understanding and verbally consented to this telephonic visit.    Interactive audio and video telecommunications were attempted between this provider and patient, however failed, due to patient having technical difficulties OR patient did not have access to video capability.  We continued and completed visit with audio only.  Some vital signs may be absent or patient reported.   Hearing/Vision screen Hearing Screening - Comments:: Patient is able to hear conversational tones without difficulty.  No issues reported.   Vision Screening - Comments:: Wears reader lenses They plan to schedule with their ophthalmologist.   Dietary issues and exercise activities discussed: Current Exercise Habits: Home exercise routine, Intensity: Mild Healthy diet Good water intake   Goals Addressed             This Visit's Progress     Increase physical activity       Stay active        Depression Screen PHQ 2/9 Scores 05/10/2021 05/06/2021 01/03/2021 11/12/2020 08/16/2020 07/05/2020 05/17/2020  PHQ - 2 Score 0 0 0 0 0 0 0  PHQ- 9 Score 0 2 - - - 8 -    Fall Risk Fall Risk  05/10/2021 05/06/2021 01/03/2021 12/22/2020 11/12/2020  Falls in the past year? 1 1 0 0 0  Comment - - - - -  Number falls in past yr: - 1 0 - -  Injury with Fall? - 1 0 - -  Comment - - - - -  Risk for fall due to : History of fall(s) - Impaired vision;Impaired mobility;Impaired balance/gait;Medication side effect - -  Risk for fall due to: Comment - - - - -  Follow up - Falls evaluation completed Falls prevention discussed;Education provided;Falls evaluation completed Falls evaluation completed Falls prevention discussed    FALL RISK PREVENTION PERTAINING TO THE HOME: Handrails in use when climbing stairs? Yes Home free of loose throw rugs in walkways, pet beds, electrical cords, etc? Yes  Adequate lighting in your home to reduce risk of falls? Yes   ASSISTIVE DEVICES UTILIZED TO PREVENT FALLS: Life alert? No  Use of a cane, walker or w/c? No   TIMED UP AND GO: Was the test performed? No .   Cognitive Function: Patient is alert and oriented x3.  Denies difficulty focusing, making decisions, memory loss.  MMSE/6CIT deferred. Normal by direct communication/observation.  MMSE - Mini Mental State Exam 05/06/2018 09/13/2016  Orientation to time 5 5  Orientation to Place 5 5  Registration 3 3  Attention/ Calculation 5 5  Recall 3 3  Language- name 2 objects 2 2  Language- repeat 1 1  Language- follow 3 step command 3 3  Language- read & follow direction 1 1  Write a sentence 1 1  Copy design 1 1  Total score 30 30     6CIT Screen 05/07/2020 05/07/2019 09/13/2016  What Year? 0 points 0 points 0 points  What month? 0 points 0 points 0 points  What time? - 0 points 0 points  Count back from 20 - 0 points 0 points  Months in reverse 0 points 0  points 0 points  Repeat phrase 0 points - -    Immunizations Immunization History  Administered Date(s) Administered   Fluad Quad(high Dose 65+) 09/23/2019   Influenza Split 08/20/2014   Influenza,inj,Quad PF,6+ Mos 06/28/2016, 07/29/2018   Influenza-Unspecified 08/04/2020   Janssen (J&J) SARS-COV-2 Vaccination 09/07/2020   PFIZER(Purple Top)SARS-COV-2 Vaccination 11/30/2020, 03/02/2021   Pneumococcal Polysaccharide-23 08/20/2014, 06/21/2019    TDAP status: Due, Education has been provided regarding the importance of this vaccine. Advised may receive this vaccine at local pharmacy or Health Dept. Aware to provide a copy of the vaccination record if obtained from local pharmacy or Health Dept. Verbalized acceptance and understanding. Deferred.   Health Maintenance  There are no preventive care reminders to display for this patient. Health Maintenance  Topic Date Due   OPHTHALMOLOGY EXAM  05/10/2021 (Originally 04/27/1961)   Zoster Vaccines- Shingrix (1 of 2) 08/10/2021 (Originally 04/26/1970)   MAMMOGRAM  05/10/2022 (Originally 12/14/2018)   DEXA SCAN  05/10/2022 (Originally 04/26/2018)   TETANUS/TDAP  05/10/2022 (Originally 04/26/1970)   PNA vac Low Risk Adult (2 of 2 - PCV13) 05/10/2022 (Originally 06/20/2020)   INFLUENZA VACCINE  06/06/2021   COVID-19 Vaccine (4 - Booster for Janssen series) 07/02/2021   FOOT EXAM  07/05/2021   HEMOGLOBIN A1C  11/06/2021   COLONOSCOPY (Pts 45-48yrs Insurance coverage will need to be confirmed)  07/25/2023   Hepatitis C Screening  Completed   HPV VACCINES  Aged Out   Mammogram-deferred per patient.   Bone Density- deferred per patient.   Dental Screening: Recommended annual dental exams for proper oral hygiene  Community Resource Referral / Chronic Care Management: CRR required this visit?  No   CCM required this visit?  No      Plan:   Keep all routine maintenance appointments.   I have personally reviewed and noted the following in  the patient's chart:   Medical and social history Use of alcohol, tobacco or illicit drugs  Current medications and supplements including opioid prescriptions.  Functional ability and status Nutritional status Physical activity Advanced directives List of other physicians Hospitalizations, surgeries, and ER visits in previous 12 months Vitals Screenings to include cognitive, depression, and falls Referrals and appointments  In addition, I have reviewed and discussed with patient certain preventive protocols, quality metrics, and best practice recommendations. A written personalized care plan for preventive services as well as general preventive health recommendations were provided to patient via mychart.     Varney Biles, LPN   11/13/15

## 2021-05-11 ENCOUNTER — Ambulatory Visit (INDEPENDENT_AMBULATORY_CARE_PROVIDER_SITE_OTHER): Payer: Medicare Other | Admitting: *Deleted

## 2021-05-11 DIAGNOSIS — D509 Iron deficiency anemia, unspecified: Secondary | ICD-10-CM | POA: Diagnosis not present

## 2021-05-11 DIAGNOSIS — J439 Emphysema, unspecified: Secondary | ICD-10-CM

## 2021-05-11 DIAGNOSIS — D62 Acute posthemorrhagic anemia: Secondary | ICD-10-CM | POA: Diagnosis not present

## 2021-05-11 DIAGNOSIS — I5032 Chronic diastolic (congestive) heart failure: Secondary | ICD-10-CM | POA: Diagnosis not present

## 2021-05-11 DIAGNOSIS — K5520 Angiodysplasia of colon without hemorrhage: Secondary | ICD-10-CM

## 2021-05-11 NOTE — Patient Instructions (Signed)
Visit Information  PATIENT GOALS:  Goals Addressed             This Visit's Progress    (RNCM) Track and Manage Fluids and Swelling   On track    Timeframe:  Long-Range Goal Priority:  High Start Date:  01/03/21                           Expected End Date:   10/05/21                    Follow Up Date 05/23/2021    Call office if I gain more than 2 pounds in one day or 5 pounds in one week Weigh myself daily and record in log along with LVAD daily parameters for provider review Monitor lower extremities and abdomen for swelling Take torsemide daily as prescribed Call LVAD team as soon as possible with increase in shortness of breath, do not wait days later Take doppler and BP cuff to next cardiology appointment to have staff assist you in learning how to use equipment Have fiance assist with monitoring/Doppler blood pressures daily  Why is this important?   It is important to check your weight daily and watch how much salt and liquids you have.  It will help you to manage your heart failure.    Notes:       (RNCM) Track and Manage My Symptoms-COPD   On track    Timeframe:  Long-Range Goal Priority:  High Start Date:  01/03/21                           Expected End Date:   10/05/21                    Follow Up Date 05/23/21    Develop a rescue plan and follow if symptoms flare up Monitor and Eliminate symptom triggers at home [place] for home (contact local fire department to seek assistance in obtaining) Contact provider for increase use of rescue inhaler/nebulizer (use rescue inhaler and nebulizer for increase in shortness of breath) Call LVAD team for increase in shortness of breath on day 2 instead of waiting days later when it is worse   Why is this important?   Tracking your symptoms and other information about your health helps your doctor plan your care.  Write down the symptoms, the time of day, what you were doing and what medicine you are taking.   You will soon learn how to manage your symptoms.     Notes:       (RNCM) Track My Symptoms-GI Bleed   Not on track    Timeframe:  Long-Range Goal Priority:  High Start Date: 01/03/21                            Expected End Date: 07/05/21                      Follow Up Date 05/23/21   Continue to monitor yourself for low hemoglobin (feeling tired, exhausted, pale, shortness of breath)--SEEK [place] what makes symptoms worse and what makes them better  Contact provider for symptoms right away Pay attention to LVAD alerts Monitor stools for blood Obtain home INR and call to Cardiology office for medication adjustment weekly as ordered  CONSIDER GOING TO ER FOR HGB LESS THAN 8 PER PCP RECOMMENDATIONS   Why is this important?   Keeping track of symptoms that you have helps you to understand your condition. You will also learn what works to manage it.  You and your doctor will then be able to come up with the best treatment plan for you.    Notes:          Patient verbalizes understanding of instructions provided today and agrees to view in [place].   The care management team will reach out to the patient again over the next 20 business days.   Hubert Azure RN, MSN RN Care Management Coordinator [place] [phone] [email or web address]

## 2021-05-11 NOTE — Chronic Care Management (AMB) (Signed)
Chronic Care Management   CCM RN Visit Note  05/11/2021 Name: Ashley [name] MRN: [id number] DOB: May 27, 1951  Subjective: Ashley [name] is a 70 y.o. year old female who is a primary care patient of Ashley Mc, MD. The care management team was consulted for assistance with disease management and care coordination needs.    Engaged with patient by telephone for follow up visit in response to provider referral for case management and/or care coordination services.   Consent to Services:  The patient was given information about Chronic Care Management services, agreed to services, and gave verbal consent prior to initiation of services.  Please see initial visit note for detailed documentation.   Patient agreed to services and verbal consent obtained.   Assessment: Review of patient past medical history, allergies, medications, health status, including review of consultants reports, laboratory and other test data, was performed as part of comprehensive evaluation and provision of chronic care management services.   SDOH (Social Determinants of Health) assessments and interventions performed:    CCM Care Plan  Allergies  Allergen Reactions   Vancomycin Nausea And Vomiting and Palpitations   Hydrocodone-Acetaminophen Nausea Only    Outpatient Encounter Medications as of 05/11/2021  Medication Sig Note   amitriptyline (ELAVIL) 25 MG tablet Take 1 tablet (25 mg total) by mouth at bedtime.    acetaminophen (TYLENOL) 325 MG tablet Take 650 mg by mouth every 6 (six) hours as needed.    albuterol (PROAIR HFA) 108 (90 Base) MCG/ACT inhaler Inhale 1-2 puffs into the lungs every 6 (six) hours as needed for wheezing or shortness of breath.    allopurinol (ZYLOPRIM) 100 MG tablet TAKE ONE TABLET BY MOUTH ONCE DAILY    ALPRAZolam (XANAX) 0.5 MG tablet TAKE 1/2 TO 1 TABLET BY MOUTH AT BEDTIME AS NEEDED FOR ANXIETY    amLODipine (NORVASC) 2.5 MG tablet Take 2.5 mg by mouth daily.     atorvastatin (LIPITOR) 40 MG tablet TAKE ONE TABLET BY MOUTH ONCE DAILY    carvedilol (COREG) 6.25 MG tablet Take 6.25 mg by mouth 2 (two) times daily. (Patient not taking: No sig reported)    colchicine 0.6 MG tablet  01/03/2021: Reports taking only when gout flares up   cyanocobalamin (,VITAMIN B-12,) 1000 MCG/ML injection Inject 1 mL into the muscle monthly (Patient taking differently: Inject 1,000 mcg into the muscle every 30 (thirty) days.)    escitalopram (LEXAPRO) 10 MG tablet TAKE ONE TABLET BY MOUTH ONCE DAILY    ferrous sulfate 325 (65 FE) MG tablet Take 325 mg by mouth daily with breakfast.    fluticasone (FLONASE) 50 MCG/ACT nasal spray Place 1 spray into both nostrils as needed for allergies or rhinitis.    folic acid (FOLVITE) 1 MG tablet TAKE ONE TABLET BY MOUTH ONCE DAILY    GNP MUCUS ER 600 MG 12 hr tablet Take 600 mg by mouth 2 (two) times daily. 01/03/2021: Reports taking as needed   ipratropium-albuterol (DUONEB) 0.5-2.5 (3) MG/3ML SOLN USE 1 VIAL VIA NEBULIZER AND INHALE BY MOUTH INTO THE LUNGS EVERY 6 HOURS AS NEEDED    levothyroxine (SYNTHROID) 50 MCG tablet Take 1 tablet (50 mcg total) by mouth daily.    magnesium oxide (MAG-OX) 400 (241.3 Mg) MG tablet Take 1 tablet by mouth every Monday, Wednesday, and Friday.    omeprazole (PRILOSEC) 40 MG capsule TAKE ONE CAPSULE BY MOUTH ONCE DAILY (Patient taking differently: Take 40 mg by mouth in the morning and at bedtime.) 01/03/2021: Reports  taking daily   potassium chloride (KLOR-CON) 10 MEQ tablet Take 10 mEq by mouth every Monday, Wednesday, and Friday. Take 2 tablets every Monday Wednesday Friday    sacubitril-valsartan (ENTRESTO) 97-103 MG Take 1 tablet by mouth 2 (two) times daily.    senna-docusate (SENOKOT-S) 8.6-50 MG tablet Take by mouth.    torsemide (DEMADEX) 20 MG tablet Take 20 mg by mouth daily.    TRELEGY ELLIPTA 100-62.5-25 MCG/INH AEPB INHALE 1 PUFF BY MOUTH INTO THE LUNGS EVERY DAY    warfarin (COUMADIN) 2 MG  tablet Take 4 mg by mouth at bedtime. Monday, Wednesday and Friday 3 mg and AOD takes 4 mg.    No facility-administered encounter medications on file as of 05/11/2021.    Patient Active Problem List   Diagnosis Date Noted   Malignant neoplasm of colon, unspecified part of colon ([place]) 05/06/2021   Neuropathy 05/06/2021   Sternal wound infection 12/23/2020   Anticoagulation goal of INR 1.5 to 2.5 12/23/2020   LVAD (left ventricular assist device) present ([place]) 51/88/4166   Complication involving left ventricular assist device (LVAD) 12/23/2020   Hypothyroidism 12/23/2020   Snoring 07/05/2020   Pressure injury of skin 01/01/2020   CKD (chronic kidney disease), stage IIIa 12/31/2019   Pulmonary edema cardiac cause ([place]) 12/25/2019   Hyperlipidemia    Hyperkalemia    Gouty arthritis of both feet 11/20/2019   Mitral stenosis with insufficiency    Hypertensive urgency    Depression    Hyperglycemia 09/22/2019   Presence of permanent cardiac pacemaker    Acute blood loss anemia    Chronic kidney disease    Hypertension    CHF (congestive heart failure) ([place]) 07/16/2019   Insomnia 05/07/2019   Suspected COVID-19 virus infection 03/23/2019   AVM (arteriovenous malformation) of small bowel, acquired    Anemia, iron deficiency 11/10/2018   Rectal polyp    Benign neoplasm of cecum    Barrett's esophagus without dysplasia    Chronic diastolic heart failure ([place]) 07/03/2018   HTN (hypertension) 07/03/2018   COPD with emphysema ([place]) 06/28/2018   B12 deficiency anemia 06/28/2018   Personal history of colon cancer    Polyp of sigmoid colon    Diverticulosis of large intestine without diverticulitis    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   Renal artery stenosis ([place]) 10/03/2016   Failure of implantable cardioverter-defibrillator (ICD) lead 02/09/2015   Tobacco abuse counseling 10/11/2014   Tobacco abuse 10/11/2014   Chronic right hip pain 08/26/2014   Atherosclerosis of native artery  of extremity with intermittent claudication ([place]) 06/18/2013   Preoperative evaluation to rule out surgical contraindication 06/18/2013   CAD (coronary artery disease) 06/01/2013   GERD (gastroesophageal reflux disease) 06/01/2013   Hypercholesterolemia 06/01/2013   Tubular adenoma of colon 06/01/2013   Major depressive disorder in remission ([place]) 06/01/2013    Conditions to be addressed/monitored:CHF, COPD, and Anemia  Care Plan : Heart Failure/COPD  Updates made by Leona Singleton, RN since 05/11/2021 12:00 AM     Problem: COPD and Heart Failure complications causing hospitalization   Priority: High     Long-Range Goal: Patient will report no COPD or heart failure exacerbations causing hospitalizations within the next 90 days.   Start Date: 01/03/2021  Expected End Date: 10/05/2021  This Visit's Progress: On track  Recent Progress: On track  Priority: High  Note:   Current Barriers:  Knowledge deficit related to self care management of CHF and COPD as evidenced by recent hospitalization related to fluid retention  and pneumonia.  Reports she continues to feel well.  Denies any lower extremity edema.  Still able to get up and move around without being short of breath; states short of breath only when outside in the heat.  States she has had to use her rescue inhaler about 3 times in the past few weeks.  Weight this morning was 154 pounds (151-154 baseline)  Still not able to obtain blood pressure reading, due to difficulities with dopplering on her own, even with the assistance of her fiance'. Denies any LVAD alerts.  Reports compliance with [employer].    Case Manager Clinical Goal(s):  patient will verbalize understanding of Heart Failure and COPD Action Plan and when to call doctor patient will take all mediations as prescribed patient will weigh daily and record (notifying MD of 3 lb weight gain over night or 5 lb in a week) Patient will report no hospitalization within the next 90  days Interventions:  Collaboration with Ashley Mc, MD regarding development and update of comprehensive plan of care as evidenced by provider attestation and co-signature Inter-disciplinary care team collaboration (see longitudinal plan of care) Discussed with patient when to weigh each morning after emptying bladder and placing battery in LVAD per instructions Discussed importance of daily weight and advised patient to weigh and record daily; encouraged patient to use 2022 Calendar booklet to document weights to keep track of increase for review and provider review (instead of trying to remember from day to day) Reviewed role of diuretics in prevention of fluid overload and management of heart failure, encouraged patient to monitor symptoms and notify provider if increase in fluid retention, weight, or shortness of breath Reviewed medications and encouraged medication compliance Encouraged patient to obtain home CO2 detectors and to contact local Fire Department to verify any programs supplying families with CO2 detectors, reviewed where patient could purchase from Discussed COPD triggers including pollen and allergy season and action plan and when to use inhaler versus nebulizer Discussed use of nebulizer and rescue inhaler for shortness of breath to see if they help Confirmed patient has LVAD supplies; verified patient is changing per orders Verified patient has not had any LAVD alarms Discussed patient monitoring blood pressures daily using doppler per cardiology request and encouraged patient to seek assistance from her fiance to help her doppler pressure and record reading Instructed and encouraged patient to take doppler and blood pressure cuff to next Cardiology appointment to have staff instruct her on correct use Encouraged patient to discuss blood pressure goal (high and low) with Cardiologist Provided patient emotional support and empathy with the recent loss of her nephew   Encouraged to keep and attend scheduled medical appointments: Cardiology 05/30/21 Reinforced with patient to contact her LVAD team for shortness of breath on day 1 or 2, not to wait until shortness of breath is too severe (day 4) for treatment options or instructions Patient Goals/Self-Care Activities Call office if I gain more than 2 pounds in one day or 5 pounds in one week Weigh myself daily and record in log along with LVAD daily parameters for provider review Monitor lower extremities and abdomen for swelling Take torsemide daily as prescribed Call LVAD team as soon as possible with increase in shortness of breath, do not wait days later Take doppler and BP cuff to next cardiology appointment to have staff assist you in learning how to use equipment Have fiance assist with monitoring/Doppler blood pressures daily Develop a rescue plan and follow if symptoms flare up  Monitor and Eliminate symptom triggers at home [place] for home (contact local fire department to seek assistance in obtaining) Contact provider for increase use of rescue inhaler/nebulizer (use rescue inhaler and nebulizer for increase in shortness of breath) Call LVAD team for increase in shortness of breath on day 2 instead of waiting days later when it is worse Follow Up Plan: The care management team will reach out to the patient again over the next 20 business days.       Problem: Recurrent GI Bleeds leading to hospitalizations   Priority: Medium     Long-Range Goal: Patient will report no recurrent hospitalizations related to GI bleed in the next 90 days   Start Date: 01/03/2021  Expected End Date: 07/05/2021  This Visit's Progress: Not on track  Recent Progress: On track  Priority: High  Note:   Current Barriers:  Knowledge deficits related to self health management of anemia or bleeding as evidenced by recent recurrent hospitalizations.  Home INR machine is still working and patient has been  checking weekly and reporting to Cardiology team.  INR has been 1.8-2.4 and coumadin doses are being adjusted.  Recent Hgb down to 7.8 on 05/06/21.  Instructed by PCP to go to ER.  Patient [facility] at [facility] and per patient was instructed not to go to ER if she was asymptomatic.  Reviewed signs and symptoms of anemia with patient.  Denies any signs or symptoms of blood in stool or other symtoms of anemia.  Denies light headiness, or dizziness, denies shortness of breath, denies being pale or tired.  RNCM encouraged patient to go to ER as instructed by PCP with recent history of GI bleed.  Patient insistent she is asymptomatic and states she will seek medical attention if she develops symptoms per Cardiology instructions. Nurse Case Manager Clinical Goal(s):  patient will take all medications exactly as prescribed and will call provider for medication related questions patient will not experience hospital admission within the next 90 days related to GI bleeding. Hospital Admissions in last 6 months =5 patient will attend all scheduled medical appointments: LVAD clinic/INR check; Cardiology 05/30/21 Interventions:  Collaboration with Ashley Mc, MD regarding development and update of comprehensive plan of care as evidenced by provider attestation and co-signature Inter-disciplinary care team collaboration (see longitudinal plan of care) Medications reviewed and encouraged medication compliance Encouraged to continue to increase activity as tolerated Encouraged sitting before standing and using an assistive device; reviewed and reinforced fall precautions and preventions, encouraged patient to dangle before standing and to sit back down if feeling dizziness Encouraged dietary changes to increase dietary intake of iron, Vitamin Z61 and folic acid as advised/prescribed Provided education about signs and symptoms of active bleeding and advised when to call provider or 911 Signs/symptoms of bleeding  reviewed (weakness, exhaustion, paleness, sleepiness, blood in stool, dark tarry stools) Encouraged to contact provider/cardiology with next INR check weekly Reinforced PCP's recommendations to go to ER for low Hgb (patient declines---encouraged to seek medical attention right away if symptoms start) Confirmed with patient Cardiology appointment on 7/25 encouraged to keep and attend, discussed and instructed if any symptoms arise to contact them immediately or go to ER Patient Goals/Self-Care Activities: Continue to monitor yourself for low hemoglobin (feeling tired, exhausted, pale, shortness of breath)--SEEK MEDICAL ATTENTION RIGHT AWAY Track what makes symptoms worse and what makes them better  Contact provider for symptoms right away Pay attention to LVAD alerts Monitor stools for blood Obtain home INR  and call to Cardiology office for medication adjustment weekly as ordered [place] HGB LESS THAN 8 PER PCP RECOMMENDATIONS  Follow Up Plan: The care management team will reach out to the patient again over the next 20 business days.       Plan:The care management team will reach out to the patient again over the next 20 business days.  Hubert Azure RN, MSN RN Care Management Coordinator [place] [phone] [email or web address]

## 2021-05-13 DIAGNOSIS — Z4801 Encounter for change or removal of surgical wound dressing: Secondary | ICD-10-CM | POA: Diagnosis not present

## 2021-05-13 DIAGNOSIS — Z48812 Encounter for surgical aftercare following surgery on the circulatory system: Secondary | ICD-10-CM | POA: Diagnosis not present

## 2021-05-13 DIAGNOSIS — Z95811 Presence of heart assist device: Secondary | ICD-10-CM | POA: Diagnosis not present

## 2021-05-13 DIAGNOSIS — I428 Other cardiomyopathies: Secondary | ICD-10-CM | POA: Diagnosis not present

## 2021-05-13 NOTE — Telephone Encounter (Signed)
Opened in error

## 2021-05-23 ENCOUNTER — Ambulatory Visit: Payer: Medicare Other | Admitting: *Deleted

## 2021-05-23 DIAGNOSIS — J439 Emphysema, unspecified: Secondary | ICD-10-CM

## 2021-05-23 DIAGNOSIS — I5032 Chronic diastolic (congestive) heart failure: Secondary | ICD-10-CM

## 2021-05-23 DIAGNOSIS — D62 Acute posthemorrhagic anemia: Secondary | ICD-10-CM

## 2021-05-23 NOTE — Chronic Care Management (AMB) (Signed)
Chronic Care Management   CCM RN Visit Note  05/23/2021 Name: Ashley [name] MRN: [id number] DOB: 12-Jul-1951  Subjective: Ashley [name] is a 70 y.o. year old female who is a primary care patient of [name], MD. The care management team was consulted for assistance with disease management and care coordination needs.    Engaged with patient by telephone for follow up visit in response to provider referral for case management and/or care coordination services.   Consent to Services:  The patient was given information about Chronic Care Management services, agreed to services, and gave verbal consent prior to initiation of services.  Please see initial visit note for detailed documentation.   Patient agreed to services and verbal consent obtained.   Assessment: Review of patient past medical history, allergies, medications, health status, including review of consultants reports, laboratory and other test data, was performed as part of comprehensive evaluation and provision of chronic care management services.   SDOH (Social Determinants of Health) assessments and interventions performed:    CCM Care Plan  Allergies  Allergen Reactions   Vancomycin Nausea And Vomiting and Palpitations   Hydrocodone-Acetaminophen Nausea Only    Outpatient Encounter Medications as of 05/23/2021  Medication Sig Note   albuterol (PROAIR HFA) 108 (90 Base) MCG/ACT inhaler Inhale 1-2 puffs into the lungs every 6 (six) hours as needed for wheezing or shortness of breath.    torsemide (DEMADEX) 20 MG tablet Take 20 mg by mouth daily.    TRELEGY ELLIPTA 100-62.5-25 MCG/INH AEPB INHALE 1 PUFF BY MOUTH INTO THE LUNGS EVERY DAY    acetaminophen (TYLENOL) 325 MG tablet Take 650 mg by mouth every 6 (six) hours as needed.    allopurinol (ZYLOPRIM) 100 MG tablet TAKE ONE TABLET BY MOUTH ONCE DAILY    ALPRAZolam (XANAX) 0.5 MG tablet TAKE 1/2 TO 1 TABLET BY MOUTH AT BEDTIME AS NEEDED FOR ANXIETY     amitriptyline (ELAVIL) 25 MG tablet Take 1 tablet (25 mg total) by mouth at bedtime.    amLODipine (NORVASC) 2.5 MG tablet Take 2.5 mg by mouth daily.    atorvastatin (LIPITOR) 40 MG tablet TAKE ONE TABLET BY MOUTH ONCE DAILY    carvedilol (COREG) 6.25 MG tablet Take 6.25 mg by mouth 2 (two) times daily. (Patient not taking: No sig reported)    colchicine 0.6 MG tablet  01/03/2021: Reports taking only when gout flares up   cyanocobalamin (,VITAMIN B-12,) 1000 MCG/ML injection Inject 1 mL into the muscle monthly (Patient taking differently: Inject 1,000 mcg into the muscle every 30 (thirty) days.)    escitalopram (LEXAPRO) 10 MG tablet TAKE ONE TABLET BY MOUTH ONCE DAILY    ferrous sulfate 325 (65 FE) MG tablet Take 325 mg by mouth daily with breakfast.    fluticasone (FLONASE) 50 MCG/ACT nasal spray Place 1 spray into both nostrils as needed for allergies or rhinitis.    folic acid (FOLVITE) 1 MG tablet TAKE ONE TABLET BY MOUTH ONCE DAILY    GNP MUCUS ER 600 MG 12 hr tablet Take 600 mg by mouth 2 (two) times daily. 01/03/2021: Reports taking as needed   ipratropium-albuterol (DUONEB) 0.5-2.5 (3) MG/3ML SOLN USE 1 VIAL VIA NEBULIZER AND INHALE BY MOUTH INTO THE LUNGS EVERY 6 HOURS AS NEEDED    levothyroxine (SYNTHROID) 50 MCG tablet Take 1 tablet (50 mcg total) by mouth daily.    magnesium oxide (MAG-OX) 400 (241.3 Mg) MG tablet Take 1 tablet by mouth every Monday, Wednesday, and  Friday.    omeprazole (PRILOSEC) 40 MG capsule TAKE ONE CAPSULE BY MOUTH ONCE DAILY (Patient taking differently: Take 40 mg by mouth in the morning and at bedtime.) 01/03/2021: Reports taking daily   potassium chloride (KLOR-CON) 10 MEQ tablet Take 10 mEq by mouth every Monday, Wednesday, and Friday. Take 2 tablets every Monday Wednesday Friday    sacubitril-valsartan (ENTRESTO) 97-103 MG Take 1 tablet by mouth 2 (two) times daily.    senna-docusate (SENOKOT-S) 8.6-50 MG tablet Take by mouth.    warfarin (COUMADIN) 2 MG tablet  Take 4 mg by mouth at bedtime. Monday, Wednesday and Friday 3 mg and AOD takes 4 mg.    No facility-administered encounter medications on file as of 05/23/2021.    Patient Active Problem List   Diagnosis Date Noted   Malignant neoplasm of colon, unspecified part of colon ([place]) 05/06/2021   Neuropathy 05/06/2021   Sternal wound infection 12/23/2020   Anticoagulation goal of INR 1.5 to 2.5 12/23/2020   LVAD (left ventricular assist device) present ([place]) 81/11/7508   Complication involving left ventricular assist device (LVAD) 12/23/2020   Hypothyroidism 12/23/2020   Snoring 07/05/2020   Pressure injury of skin 01/01/2020   CKD (chronic kidney disease), stage IIIa 12/31/2019   Pulmonary edema cardiac cause ([place]) 12/25/2019   Hyperlipidemia    Hyperkalemia    Gouty arthritis of both feet 11/20/2019   Mitral stenosis with insufficiency    Hypertensive urgency    Depression    Hyperglycemia 09/22/2019   Presence of permanent cardiac pacemaker    Acute blood loss anemia    Chronic kidney disease    Hypertension    CHF (congestive heart failure) ([place]) 07/16/2019   Insomnia 05/07/2019   Suspected COVID-19 virus infection 03/23/2019   AVM (arteriovenous malformation) of small bowel, acquired    Anemia, iron deficiency 11/10/2018   Rectal polyp    Benign neoplasm of cecum    Barrett's esophagus without dysplasia    Chronic diastolic heart failure ([place]) 07/03/2018   HTN (hypertension) 07/03/2018   COPD with emphysema ([place]) 06/28/2018   B12 deficiency anemia 06/28/2018   Personal history of colon cancer    Polyp of sigmoid colon    Diverticulosis of large intestine without diverticulitis    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   Renal artery stenosis ([place]) 10/03/2016   Failure of implantable cardioverter-defibrillator (ICD) lead 02/09/2015   Tobacco abuse counseling 10/11/2014   Tobacco abuse 10/11/2014   Chronic right hip pain 08/26/2014   Atherosclerosis of native artery of  extremity with intermittent claudication ([place]) 06/18/2013   Preoperative evaluation to rule out surgical contraindication 06/18/2013   CAD (coronary artery disease) 06/01/2013   GERD (gastroesophageal reflux disease) 06/01/2013   Hypercholesterolemia 06/01/2013   Tubular adenoma of colon 06/01/2013   Major depressive disorder in remission ([place]) 06/01/2013    Conditions to be addressed/monitored:CHF, COPD, and Anemia  Care Plan : Heart Failure/COPD  Updates made by Leona Singleton, RN since 05/23/2021 12:00 AM     Problem: COPD and Heart Failure complications causing hospitalization   Priority: High     Long-Range Goal: Patient will report no COPD or heart failure exacerbations causing hospitalizations within the next 90 days.   Start Date: 01/03/2021  Expected End Date: 11/04/2021  This Visit's Progress: On track  Recent Progress: On track  Priority: High  Note:   Current Barriers:  Knowledge deficit related to self care management of CHF and COPD as evidenced by recent hospitalization related to fluid retention  and pneumonia.  Reports she continues to feel well.  Denies any lower extremity edema.  Still able to get up and move around without being short of breath; states short of breath only when outside in the heat.  States she has had to use her rescue inhaler when outside for periods of time in the heat.  Weight this morning was 153.4 pounds (151-154 baseline)  Still not able to obtain blood pressure reading, due to difficulities with dopplering on her own, even with the assistance of her fiance'. Denies any LVAD alerts.  Reports compliance with Demadex.    Case Manager Clinical Goal(s):  patient will verbalize understanding of Heart Failure and COPD Action Plan and when to call doctor patient will take all mediations as prescribed patient will weigh daily and record (notifying MD of 3 lb weight gain over night or 5 lb in a week) Patient will report no hospitalization within the  next 90 days Interventions:  Collaboration with [name], MD regarding development and update of comprehensive plan of care as evidenced by provider attestation and co-signature Inter-disciplinary care team collaboration (see longitudinal plan of care) Discussed with patient when to weigh each morning after emptying bladder and placing battery in LVAD per instructions Discussed importance of daily weight and advised patient to weigh and record daily; encouraged patient to use 2022 Calendar booklet to document weights to keep track of increase for review and provider review (instead of trying to remember from day to day) Reviewed role of diuretics in prevention of fluid overload and management of heart failure, encouraged patient to monitor symptoms and notify provider if increase in fluid retention, weight, or shortness of breath Reviewed medications and encouraged medication compliance Encouraged patient to obtain home CO2 detectors and to contact local Fire Department to verify any programs supplying families with CO2 detectors, reviewed where patient could purchase from Discussed COPD triggers including pollen and allergy season and action plan and when to use inhaler versus nebulizer Discussed use of nebulizer and rescue inhaler for shortness of breath to see if they help Confirmed patient has LVAD supplies; verified patient is changing per orders Verified patient has not had any LAVD alarms Discussed patient monitoring blood pressures daily using doppler per cardiology request and encouraged patient to seek assistance from her fiance to help her doppler pressure and record reading Instructed and encouraged patient to take doppler and blood pressure cuff to next Cardiology appointment to have staff instruct her on correct use Encouraged patient to discuss blood pressure goal (high and low) with Cardiologist Provided patient emotional support and empathy with the recent loss of her nephew   Encouraged to keep and attend scheduled medical appointments: Cardiology 05/30/21 Reinforced with patient to contact her LVAD team for shortness of breath on day 1 or 2, not to wait until shortness of breath is too severe (day 4) for treatment options or instructions Patient Goals/Self-Care Activities Call office if I gain more than 2 pounds in one day or 5 pounds in one week Weigh myself daily and record in log along with LVAD daily parameters for provider review Monitor lower extremities and abdomen for swelling Take torsemide daily as prescribed Call LVAD team as soon as possible with increase in shortness of breath, do not wait days later Take doppler and BP cuff to next cardiology appointment to have staff assist you in learning how to use equipment Have fiance assist with monitoring/Doppler blood pressures daily Develop a rescue plan and follow if symptoms flare  up Monitor and Eliminate symptom triggers at home Happy for home (contact local fire department to seek assistance in obtaining) Contact provider for increase use of rescue inhaler/nebulizer (use rescue inhaler and nebulizer for increase in shortness of breath) Call LVAD team for increase in shortness of breath on day 2 instead of waiting days later when it is worse Follow Up Plan: The care management team will reach out to the patient again over the next 20 business days.       Problem: Recurrent GI Bleeds leading to hospitalizations   Priority: High     Long-Range Goal: Patient will report no recurrent hospitalizations related to GI bleed in the next 90 days   Start Date: 01/03/2021  Expected End Date: 11/04/2021  This Visit's Progress: On track  Recent Progress: Not on track  Priority: High  Note:   Current Barriers:  Knowledge deficits related to self health management of anemia or bleeding as evidenced by recent recurrent hospitalizations.  Home INR machine is still working and patient has been  checking weekly and reporting to Cardiology team.  INR has been 1.8-2.4 and coumadin doses are being adjusted.  Recent Hgb down to 7.8 on 05/06/21.  Instructed by PCP to go to ER.  Patient contacted LVAD team at [facility] and per patient was instructed not to go to ER if she was asymptomatic.  Reviewed signs and symptoms of anemia with patient.  Continues to deny any signs or symptoms of blood in stool or other symtoms of anemia.  Denies light headiness, or dizziness, denies shortness of breath, denies being pale or tired.  Encouraged to seek medical attention right away if symptoms arise. Nurse Case Manager Clinical Goal(s):  patient will take all medications exactly as prescribed and will call provider for medication related questions patient will not experience hospital admission within the next 90 days related to GI bleeding. Hospital Admissions in last 6 months =5 patient will attend all scheduled medical appointments: LVAD clinic/INR check; Cardiology 05/30/21 Interventions:  Collaboration with [name], MD regarding development and update of comprehensive plan of care as evidenced by provider attestation and co-signature Inter-disciplinary care team collaboration (see longitudinal plan of care) Medications reviewed and encouraged medication compliance Encouraged to continue to increase activity as tolerated Encouraged sitting before standing and using an assistive device; reviewed and reinforced fall precautions and preventions, encouraged patient to dangle before standing and to sit back down if feeling dizziness Encouraged dietary changes to increase dietary intake of iron, Vitamin O24 and folic acid as advised/prescribed Provided education about signs and symptoms of active bleeding and advised when to call provider or 911 Signs/symptoms of bleeding reviewed (weakness, exhaustion, paleness, sleepiness, blood in stool, dark tarry stools) Encouraged to contact provider/cardiology with next INR  check weekly Confirmed with patient Cardiology appointment on 7/25 encouraged to keep and attend, discussed and instructed if any symptoms arise to contact them immediately or go to ER Patient Goals/Self-Care Activities: Continue to monitor yourself for low hemoglobin (feeling tired, exhausted, pale, shortness of breath)--SEEK MEDICAL ATTENTION RIGHT AWAY Track what makes symptoms worse and what makes them better  Contact provider for symptoms right away Pay attention to LVAD alerts Monitor stools for blood Obtain home INR and call to Cardiology office for medication adjustment weekly as ordered  Follow Up Plan: The care management team will reach out to the patient again over the next 20 business days.       Plan:The care management team will reach out  to the patient again over the next 20 business days.  Hubert Azure RN, MSN RN Care Management Coordinator [place] [phone] [email or web address]

## 2021-05-23 NOTE — Patient Instructions (Signed)
Visit Information  PATIENT GOALS:  Goals Addressed             This Visit's Progress    (RNCM) Track and Manage Fluids and Swelling   On track    Timeframe:  Long-Range Goal Priority:  High Start Date:  01/03/21                           Expected End Date:   10/05/21                    Follow Up Date 06/08/2021    Call office if I gain more than 2 pounds in one day or 5 pounds in one week Weigh myself daily and record in log along with LVAD daily parameters for provider review Monitor lower extremities and abdomen for swelling Take torsemide daily as prescribed Call LVAD team as soon as possible with increase in shortness of breath, do not wait days later Take doppler and BP cuff to next cardiology appointment to have staff assist you in learning how to use equipment Have fiance assist with monitoring/Doppler blood pressures daily  Why is this important?   It is important to check your weight daily and watch how much salt and liquids you have.  It will help you to manage your heart failure.    Notes:      (RNCM) Track and Manage My Symptoms-COPD   On track    Timeframe:  Long-Range Goal Priority:  High Start Date:  01/03/21                           Expected End Date:   10/05/21                    Follow Up Date 06/08/21    Develop a rescue plan and follow if symptoms flare up Monitor and Eliminate symptom triggers at home Obtain CO2 Detectors for home (contact local fire department to seek assistance in obtaining) Contact provider for increase use of rescue inhaler/nebulizer (use rescue inhaler and nebulizer for increase in shortness of breath) Call LVAD team for increase in shortness of breath on day 2 instead of waiting days later when it is worse   Why is this important?   Tracking your symptoms and other information about your health helps your doctor plan your care.  Write down the symptoms, the time of day, what you were doing and what medicine you are taking.  You  will soon learn how to manage your symptoms.     Notes:      (RNCM) Track My Symptoms-GI Bleed   On track    Timeframe:  Long-Range Goal Priority:  High Start Date: 01/03/21                            Expected End Date: 07/05/21                      Follow Up Date 06/08/21   Continue to monitor yourself for low hemoglobin (feeling tired, exhausted, pale, shortness of breath)--SEEK [place] what makes symptoms worse and what makes them better  Contact provider for symptoms right away Pay attention to LVAD alerts Monitor stools for blood Obtain home INR and call to Cardiology office for medication adjustment weekly as ordered  Why is this important?   Keeping track of symptoms that you have helps you to understand your condition. You will also learn what works to manage it.  You and your doctor will then be able to come up with the best treatment plan for you.    Notes:         Patient verbalizes understanding of instructions provided today and agrees to view in [place].   The care management team will reach out to the patient again over the next 20 business days.   Hubert Azure RN, MSN RN Care Management Coordinator [place] [phone] [email or web address]

## 2021-05-30 DIAGNOSIS — Z7901 Long term (current) use of anticoagulants: Secondary | ICD-10-CM | POA: Diagnosis not present

## 2021-05-30 DIAGNOSIS — Z952 Presence of prosthetic heart valve: Secondary | ICD-10-CM | POA: Diagnosis not present

## 2021-06-01 DIAGNOSIS — T8189XA Other complications of procedures, not elsewhere classified, initial encounter: Secondary | ICD-10-CM | POA: Diagnosis not present

## 2021-06-01 DIAGNOSIS — H547 Unspecified visual loss: Secondary | ICD-10-CM | POA: Diagnosis not present

## 2021-06-01 DIAGNOSIS — Z7901 Long term (current) use of anticoagulants: Secondary | ICD-10-CM | POA: Diagnosis not present

## 2021-06-01 DIAGNOSIS — T8141XA Infection following a procedure, superficial incisional surgical site, initial encounter: Secondary | ICD-10-CM | POA: Diagnosis not present

## 2021-06-01 DIAGNOSIS — N179 Acute kidney failure, unspecified: Secondary | ICD-10-CM | POA: Diagnosis not present

## 2021-06-01 DIAGNOSIS — E1122 Type 2 diabetes mellitus with diabetic chronic kidney disease: Secondary | ICD-10-CM | POA: Diagnosis not present

## 2021-06-01 DIAGNOSIS — Z20822 Contact with and (suspected) exposure to covid-19: Secondary | ICD-10-CM | POA: Diagnosis not present

## 2021-06-01 DIAGNOSIS — D509 Iron deficiency anemia, unspecified: Secondary | ICD-10-CM | POA: Diagnosis not present

## 2021-06-01 DIAGNOSIS — R000 Tachycardia, unspecified: Secondary | ICD-10-CM | POA: Diagnosis not present

## 2021-06-01 DIAGNOSIS — J9811 Atelectasis: Secondary | ICD-10-CM | POA: Diagnosis not present

## 2021-06-01 DIAGNOSIS — T82867A Thrombosis of cardiac prosthetic devices, implants and grafts, initial encounter: Secondary | ICD-10-CM | POA: Diagnosis not present

## 2021-06-01 DIAGNOSIS — Z95811 Presence of heart assist device: Secondary | ICD-10-CM | POA: Diagnosis not present

## 2021-06-01 DIAGNOSIS — Z792 Long term (current) use of antibiotics: Secondary | ICD-10-CM | POA: Diagnosis not present

## 2021-06-01 DIAGNOSIS — Z945 Skin transplant status: Secondary | ICD-10-CM | POA: Diagnosis not present

## 2021-06-01 DIAGNOSIS — I272 Pulmonary hypertension, unspecified: Secondary | ICD-10-CM | POA: Diagnosis not present

## 2021-06-01 DIAGNOSIS — I480 Paroxysmal atrial fibrillation: Secondary | ICD-10-CM | POA: Diagnosis not present

## 2021-06-01 DIAGNOSIS — G8918 Other acute postprocedural pain: Secondary | ICD-10-CM | POA: Diagnosis not present

## 2021-06-01 DIAGNOSIS — I639 Cerebral infarction, unspecified: Secondary | ICD-10-CM | POA: Diagnosis not present

## 2021-06-01 DIAGNOSIS — E1152 Type 2 diabetes mellitus with diabetic peripheral angiopathy with gangrene: Secondary | ICD-10-CM | POA: Diagnosis not present

## 2021-06-01 DIAGNOSIS — I70209 Unspecified atherosclerosis of native arteries of extremities, unspecified extremity: Secondary | ICD-10-CM | POA: Diagnosis not present

## 2021-06-01 DIAGNOSIS — I739 Peripheral vascular disease, unspecified: Secondary | ICD-10-CM | POA: Diagnosis not present

## 2021-06-01 DIAGNOSIS — R069 Unspecified abnormalities of breathing: Secondary | ICD-10-CM | POA: Diagnosis not present

## 2021-06-01 DIAGNOSIS — I130 Hypertensive heart and chronic kidney disease with heart failure and stage 1 through stage 4 chronic kidney disease, or unspecified chronic kidney disease: Secondary | ICD-10-CM | POA: Diagnosis not present

## 2021-06-01 DIAGNOSIS — Z1624 Resistance to multiple antibiotics: Secondary | ICD-10-CM | POA: Diagnosis not present

## 2021-06-01 DIAGNOSIS — Z9889 Other specified postprocedural states: Secondary | ICD-10-CM | POA: Diagnosis not present

## 2021-06-01 DIAGNOSIS — J811 Chronic pulmonary edema: Secondary | ICD-10-CM | POA: Diagnosis not present

## 2021-06-01 DIAGNOSIS — N183 Chronic kidney disease, stage 3 unspecified: Secondary | ICD-10-CM | POA: Diagnosis not present

## 2021-06-01 DIAGNOSIS — I708 Atherosclerosis of other arteries: Secondary | ICD-10-CM | POA: Diagnosis not present

## 2021-06-01 DIAGNOSIS — J810 Acute pulmonary edema: Secondary | ICD-10-CM | POA: Diagnosis not present

## 2021-06-01 DIAGNOSIS — T8131XA Disruption of external operation (surgical) wound, not elsewhere classified, initial encounter: Secondary | ICD-10-CM | POA: Diagnosis not present

## 2021-06-01 DIAGNOSIS — N390 Urinary tract infection, site not specified: Secondary | ICD-10-CM | POA: Diagnosis not present

## 2021-06-01 DIAGNOSIS — E1140 Type 2 diabetes mellitus with diabetic neuropathy, unspecified: Secondary | ICD-10-CM | POA: Diagnosis not present

## 2021-06-01 DIAGNOSIS — R52 Pain, unspecified: Secondary | ICD-10-CM | POA: Diagnosis not present

## 2021-06-01 DIAGNOSIS — E039 Hypothyroidism, unspecified: Secondary | ICD-10-CM | POA: Diagnosis not present

## 2021-06-01 DIAGNOSIS — H538 Other visual disturbances: Secondary | ICD-10-CM | POA: Diagnosis not present

## 2021-06-01 DIAGNOSIS — D680 Von Willebrand's disease: Secondary | ICD-10-CM | POA: Diagnosis not present

## 2021-06-01 DIAGNOSIS — I5022 Chronic systolic (congestive) heart failure: Secondary | ICD-10-CM | POA: Diagnosis not present

## 2021-06-01 DIAGNOSIS — I63532 Cerebral infarction due to unspecified occlusion or stenosis of left posterior cerebral artery: Secondary | ICD-10-CM | POA: Diagnosis not present

## 2021-06-01 DIAGNOSIS — J90 Pleural effusion, not elsewhere classified: Secondary | ICD-10-CM | POA: Diagnosis not present

## 2021-06-01 DIAGNOSIS — I6522 Occlusion and stenosis of left carotid artery: Secondary | ICD-10-CM | POA: Diagnosis not present

## 2021-06-01 DIAGNOSIS — I63512 Cerebral infarction due to unspecified occlusion or stenosis of left middle cerebral artery: Secondary | ICD-10-CM | POA: Diagnosis not present

## 2021-06-01 DIAGNOSIS — Z8619 Personal history of other infectious and parasitic diseases: Secondary | ICD-10-CM | POA: Diagnosis not present

## 2021-06-01 DIAGNOSIS — T829XXA Unspecified complication of cardiac and vascular prosthetic device, implant and graft, initial encounter: Secondary | ICD-10-CM | POA: Diagnosis not present

## 2021-06-01 DIAGNOSIS — Z4682 Encounter for fitting and adjustment of non-vascular catheter: Secondary | ICD-10-CM | POA: Diagnosis not present

## 2021-06-01 DIAGNOSIS — J449 Chronic obstructive pulmonary disease, unspecified: Secondary | ICD-10-CM | POA: Diagnosis not present

## 2021-06-01 DIAGNOSIS — T79A0XA Compartment syndrome, unspecified, initial encounter: Secondary | ICD-10-CM | POA: Diagnosis not present

## 2021-06-01 DIAGNOSIS — Z952 Presence of prosthetic heart valve: Secondary | ICD-10-CM | POA: Diagnosis not present

## 2021-06-01 DIAGNOSIS — J9601 Acute respiratory failure with hypoxia: Secondary | ICD-10-CM | POA: Diagnosis not present

## 2021-06-01 DIAGNOSIS — T8149XA Infection following a procedure, other surgical site, initial encounter: Secondary | ICD-10-CM | POA: Diagnosis not present

## 2021-06-01 DIAGNOSIS — I6502 Occlusion and stenosis of left vertebral artery: Secondary | ICD-10-CM | POA: Diagnosis not present

## 2021-06-01 DIAGNOSIS — R0689 Other abnormalities of breathing: Secondary | ICD-10-CM | POA: Diagnosis not present

## 2021-06-01 DIAGNOSIS — M79602 Pain in left arm: Secondary | ICD-10-CM | POA: Diagnosis not present

## 2021-06-01 DIAGNOSIS — I96 Gangrene, not elsewhere classified: Secondary | ICD-10-CM | POA: Diagnosis not present

## 2021-06-01 DIAGNOSIS — L7632 Postprocedural hematoma of skin and subcutaneous tissue following other procedure: Secondary | ICD-10-CM | POA: Diagnosis not present

## 2021-06-01 DIAGNOSIS — R0602 Shortness of breath: Secondary | ICD-10-CM | POA: Diagnosis not present

## 2021-06-01 DIAGNOSIS — I742 Embolism and thrombosis of arteries of the upper extremities: Secondary | ICD-10-CM | POA: Diagnosis not present

## 2021-06-01 DIAGNOSIS — Z428 Encounter for other plastic and reconstructive surgery following medical procedure or healed injury: Secondary | ICD-10-CM | POA: Diagnosis not present

## 2021-06-01 DIAGNOSIS — I6340 Cerebral infarction due to embolism of unspecified cerebral artery: Secondary | ICD-10-CM | POA: Diagnosis not present

## 2021-06-01 DIAGNOSIS — I472 Ventricular tachycardia: Secondary | ICD-10-CM | POA: Diagnosis not present

## 2021-06-01 DIAGNOSIS — S51802A Unspecified open wound of left forearm, initial encounter: Secondary | ICD-10-CM | POA: Diagnosis not present

## 2021-06-01 DIAGNOSIS — N3000 Acute cystitis without hematuria: Secondary | ICD-10-CM | POA: Diagnosis not present

## 2021-06-01 DIAGNOSIS — R918 Other nonspecific abnormal finding of lung field: Secondary | ICD-10-CM | POA: Diagnosis not present

## 2021-06-01 DIAGNOSIS — A498 Other bacterial infections of unspecified site: Secondary | ICD-10-CM | POA: Diagnosis not present

## 2021-06-01 DIAGNOSIS — I5023 Acute on chronic systolic (congestive) heart failure: Secondary | ICD-10-CM | POA: Diagnosis not present

## 2021-06-01 DIAGNOSIS — I70208 Unspecified atherosclerosis of native arteries of extremities, other extremity: Secondary | ICD-10-CM | POA: Diagnosis not present

## 2021-06-01 DIAGNOSIS — T8142XA Infection following a procedure, deep incisional surgical site, initial encounter: Secondary | ICD-10-CM | POA: Diagnosis not present

## 2021-06-01 DIAGNOSIS — Z452 Encounter for adjustment and management of vascular access device: Secondary | ICD-10-CM | POA: Diagnosis not present

## 2021-06-01 DIAGNOSIS — B952 Enterococcus as the cause of diseases classified elsewhere: Secondary | ICD-10-CM | POA: Diagnosis not present

## 2021-06-01 DIAGNOSIS — M79603 Pain in arm, unspecified: Secondary | ICD-10-CM | POA: Diagnosis not present

## 2021-06-02 DIAGNOSIS — I742 Embolism and thrombosis of arteries of the upper extremities: Secondary | ICD-10-CM | POA: Diagnosis not present

## 2021-06-02 DIAGNOSIS — I70208 Unspecified atherosclerosis of native arteries of extremities, other extremity: Secondary | ICD-10-CM | POA: Diagnosis not present

## 2021-06-02 DIAGNOSIS — M79602 Pain in left arm: Secondary | ICD-10-CM | POA: Insufficient documentation

## 2021-06-02 DIAGNOSIS — Z4682 Encounter for fitting and adjustment of non-vascular catheter: Secondary | ICD-10-CM | POA: Diagnosis not present

## 2021-06-02 DIAGNOSIS — Z95811 Presence of heart assist device: Secondary | ICD-10-CM | POA: Diagnosis not present

## 2021-06-02 DIAGNOSIS — I739 Peripheral vascular disease, unspecified: Secondary | ICD-10-CM | POA: Diagnosis not present

## 2021-06-02 DIAGNOSIS — J90 Pleural effusion, not elsewhere classified: Secondary | ICD-10-CM | POA: Diagnosis not present

## 2021-06-02 DIAGNOSIS — Z9889 Other specified postprocedural states: Secondary | ICD-10-CM | POA: Insufficient documentation

## 2021-06-03 DIAGNOSIS — I739 Peripheral vascular disease, unspecified: Secondary | ICD-10-CM | POA: Diagnosis not present

## 2021-06-03 DIAGNOSIS — J90 Pleural effusion, not elsewhere classified: Secondary | ICD-10-CM | POA: Diagnosis not present

## 2021-06-03 DIAGNOSIS — I70209 Unspecified atherosclerosis of native arteries of extremities, unspecified extremity: Secondary | ICD-10-CM | POA: Diagnosis not present

## 2021-06-03 DIAGNOSIS — I5022 Chronic systolic (congestive) heart failure: Secondary | ICD-10-CM | POA: Diagnosis not present

## 2021-06-03 DIAGNOSIS — J810 Acute pulmonary edema: Secondary | ICD-10-CM | POA: Diagnosis not present

## 2021-06-03 DIAGNOSIS — Z95811 Presence of heart assist device: Secondary | ICD-10-CM | POA: Diagnosis not present

## 2021-06-03 DIAGNOSIS — R918 Other nonspecific abnormal finding of lung field: Secondary | ICD-10-CM | POA: Diagnosis not present

## 2021-06-04 DIAGNOSIS — Z9889 Other specified postprocedural states: Secondary | ICD-10-CM | POA: Diagnosis not present

## 2021-06-04 DIAGNOSIS — J811 Chronic pulmonary edema: Secondary | ICD-10-CM | POA: Diagnosis not present

## 2021-06-04 DIAGNOSIS — I639 Cerebral infarction, unspecified: Secondary | ICD-10-CM | POA: Diagnosis not present

## 2021-06-04 DIAGNOSIS — H547 Unspecified visual loss: Secondary | ICD-10-CM | POA: Diagnosis not present

## 2021-06-04 DIAGNOSIS — I70208 Unspecified atherosclerosis of native arteries of extremities, other extremity: Secondary | ICD-10-CM | POA: Diagnosis not present

## 2021-06-04 DIAGNOSIS — I739 Peripheral vascular disease, unspecified: Secondary | ICD-10-CM | POA: Diagnosis not present

## 2021-06-04 DIAGNOSIS — H538 Other visual disturbances: Secondary | ICD-10-CM | POA: Diagnosis not present

## 2021-06-04 DIAGNOSIS — Z4682 Encounter for fitting and adjustment of non-vascular catheter: Secondary | ICD-10-CM | POA: Diagnosis not present

## 2021-06-05 DIAGNOSIS — G8918 Other acute postprocedural pain: Secondary | ICD-10-CM | POA: Diagnosis not present

## 2021-06-05 DIAGNOSIS — I639 Cerebral infarction, unspecified: Secondary | ICD-10-CM | POA: Diagnosis not present

## 2021-06-05 DIAGNOSIS — I739 Peripheral vascular disease, unspecified: Secondary | ICD-10-CM | POA: Diagnosis not present

## 2021-06-05 DIAGNOSIS — H538 Other visual disturbances: Secondary | ICD-10-CM | POA: Diagnosis not present

## 2021-06-06 DIAGNOSIS — I70208 Unspecified atherosclerosis of native arteries of extremities, other extremity: Secondary | ICD-10-CM | POA: Diagnosis not present

## 2021-06-06 DIAGNOSIS — G8918 Other acute postprocedural pain: Secondary | ICD-10-CM | POA: Diagnosis not present

## 2021-06-06 DIAGNOSIS — I739 Peripheral vascular disease, unspecified: Secondary | ICD-10-CM | POA: Diagnosis not present

## 2021-06-06 DIAGNOSIS — Z95811 Presence of heart assist device: Secondary | ICD-10-CM | POA: Diagnosis not present

## 2021-06-06 DIAGNOSIS — I639 Cerebral infarction, unspecified: Secondary | ICD-10-CM | POA: Diagnosis not present

## 2021-06-06 DIAGNOSIS — Z7901 Long term (current) use of anticoagulants: Secondary | ICD-10-CM | POA: Diagnosis not present

## 2021-06-06 DIAGNOSIS — T829XXA Unspecified complication of cardiac and vascular prosthetic device, implant and graft, initial encounter: Secondary | ICD-10-CM | POA: Diagnosis not present

## 2021-06-06 DIAGNOSIS — H538 Other visual disturbances: Secondary | ICD-10-CM | POA: Diagnosis not present

## 2021-06-07 ENCOUNTER — Other Ambulatory Visit: Payer: Self-pay | Admitting: Internal Medicine

## 2021-06-07 DIAGNOSIS — H538 Other visual disturbances: Secondary | ICD-10-CM | POA: Diagnosis not present

## 2021-06-07 DIAGNOSIS — I639 Cerebral infarction, unspecified: Secondary | ICD-10-CM | POA: Diagnosis not present

## 2021-06-07 DIAGNOSIS — I6522 Occlusion and stenosis of left carotid artery: Secondary | ICD-10-CM | POA: Diagnosis not present

## 2021-06-07 DIAGNOSIS — I6502 Occlusion and stenosis of left vertebral artery: Secondary | ICD-10-CM | POA: Diagnosis not present

## 2021-06-07 DIAGNOSIS — I70208 Unspecified atherosclerosis of native arteries of extremities, other extremity: Secondary | ICD-10-CM | POA: Diagnosis not present

## 2021-06-07 DIAGNOSIS — I739 Peripheral vascular disease, unspecified: Secondary | ICD-10-CM | POA: Diagnosis not present

## 2021-06-07 DIAGNOSIS — I63512 Cerebral infarction due to unspecified occlusion or stenosis of left middle cerebral artery: Secondary | ICD-10-CM | POA: Diagnosis not present

## 2021-06-07 DIAGNOSIS — G8918 Other acute postprocedural pain: Secondary | ICD-10-CM | POA: Diagnosis not present

## 2021-06-08 ENCOUNTER — Telehealth: Payer: Medicare Other

## 2021-06-08 ENCOUNTER — Telehealth: Payer: Self-pay | Admitting: *Deleted

## 2021-06-08 DIAGNOSIS — I739 Peripheral vascular disease, unspecified: Secondary | ICD-10-CM | POA: Diagnosis not present

## 2021-06-08 DIAGNOSIS — H538 Other visual disturbances: Secondary | ICD-10-CM | POA: Diagnosis not present

## 2021-06-08 DIAGNOSIS — I639 Cerebral infarction, unspecified: Secondary | ICD-10-CM | POA: Insufficient documentation

## 2021-06-08 DIAGNOSIS — I70208 Unspecified atherosclerosis of native arteries of extremities, other extremity: Secondary | ICD-10-CM | POA: Diagnosis not present

## 2021-06-08 DIAGNOSIS — Z95811 Presence of heart assist device: Secondary | ICD-10-CM | POA: Diagnosis not present

## 2021-06-08 DIAGNOSIS — T829XXA Unspecified complication of cardiac and vascular prosthetic device, implant and graft, initial encounter: Secondary | ICD-10-CM | POA: Diagnosis not present

## 2021-06-08 DIAGNOSIS — G8918 Other acute postprocedural pain: Secondary | ICD-10-CM | POA: Diagnosis not present

## 2021-06-08 DIAGNOSIS — Z7901 Long term (current) use of anticoagulants: Secondary | ICD-10-CM | POA: Diagnosis not present

## 2021-06-08 NOTE — Telephone Encounter (Signed)
  Care Management   Follow Up Note   06/08/2021 Name: Ashley [name] MRN: [id number] DOB: 08-Jun-1951   Referred by: [name], MD Reason for referral : Chronic Care Management (CHF, COPD)   Upon chart review noticed patient hospitalized at [facility] for upper extremity embolectomy and possible CVA.  CCM appointment will be rescheduled.  Follow Up Plan:  RNCM will reschedule this appointment within the next 20 business days.  Hubert Azure RN, MSN RN Care Management Coordinator [place] [phone] [email or web address]

## 2021-06-09 DIAGNOSIS — I639 Cerebral infarction, unspecified: Secondary | ICD-10-CM | POA: Diagnosis not present

## 2021-06-09 DIAGNOSIS — Z95811 Presence of heart assist device: Secondary | ICD-10-CM | POA: Diagnosis not present

## 2021-06-09 DIAGNOSIS — H538 Other visual disturbances: Secondary | ICD-10-CM | POA: Diagnosis not present

## 2021-06-09 DIAGNOSIS — G8918 Other acute postprocedural pain: Secondary | ICD-10-CM | POA: Diagnosis not present

## 2021-06-09 DIAGNOSIS — Z7901 Long term (current) use of anticoagulants: Secondary | ICD-10-CM | POA: Diagnosis not present

## 2021-06-09 DIAGNOSIS — I739 Peripheral vascular disease, unspecified: Secondary | ICD-10-CM | POA: Diagnosis not present

## 2021-06-09 DIAGNOSIS — T829XXA Unspecified complication of cardiac and vascular prosthetic device, implant and graft, initial encounter: Secondary | ICD-10-CM | POA: Diagnosis not present

## 2021-06-09 DIAGNOSIS — I70208 Unspecified atherosclerosis of native arteries of extremities, other extremity: Secondary | ICD-10-CM | POA: Diagnosis not present

## 2021-06-10 DIAGNOSIS — T829XXA Unspecified complication of cardiac and vascular prosthetic device, implant and graft, initial encounter: Secondary | ICD-10-CM | POA: Diagnosis not present

## 2021-06-10 DIAGNOSIS — I739 Peripheral vascular disease, unspecified: Secondary | ICD-10-CM | POA: Diagnosis not present

## 2021-06-10 DIAGNOSIS — N3000 Acute cystitis without hematuria: Secondary | ICD-10-CM | POA: Diagnosis not present

## 2021-06-10 DIAGNOSIS — H538 Other visual disturbances: Secondary | ICD-10-CM | POA: Diagnosis not present

## 2021-06-10 DIAGNOSIS — Z95811 Presence of heart assist device: Secondary | ICD-10-CM | POA: Diagnosis not present

## 2021-06-10 DIAGNOSIS — Z7901 Long term (current) use of anticoagulants: Secondary | ICD-10-CM | POA: Diagnosis not present

## 2021-06-10 DIAGNOSIS — I70208 Unspecified atherosclerosis of native arteries of extremities, other extremity: Secondary | ICD-10-CM | POA: Diagnosis not present

## 2021-06-10 DIAGNOSIS — G8918 Other acute postprocedural pain: Secondary | ICD-10-CM | POA: Diagnosis not present

## 2021-06-10 DIAGNOSIS — I639 Cerebral infarction, unspecified: Secondary | ICD-10-CM | POA: Diagnosis not present

## 2021-06-11 DIAGNOSIS — I639 Cerebral infarction, unspecified: Secondary | ICD-10-CM | POA: Diagnosis not present

## 2021-06-11 DIAGNOSIS — G8918 Other acute postprocedural pain: Secondary | ICD-10-CM | POA: Diagnosis not present

## 2021-06-11 DIAGNOSIS — T148XXA Other injury of unspecified body region, initial encounter: Secondary | ICD-10-CM | POA: Insufficient documentation

## 2021-06-11 DIAGNOSIS — I739 Peripheral vascular disease, unspecified: Secondary | ICD-10-CM | POA: Diagnosis not present

## 2021-06-11 DIAGNOSIS — I70208 Unspecified atherosclerosis of native arteries of extremities, other extremity: Secondary | ICD-10-CM | POA: Diagnosis not present

## 2021-06-11 DIAGNOSIS — H538 Other visual disturbances: Secondary | ICD-10-CM | POA: Diagnosis not present

## 2021-06-12 DIAGNOSIS — H538 Other visual disturbances: Secondary | ICD-10-CM | POA: Diagnosis not present

## 2021-06-12 DIAGNOSIS — J9811 Atelectasis: Secondary | ICD-10-CM | POA: Diagnosis not present

## 2021-06-12 DIAGNOSIS — I70209 Unspecified atherosclerosis of native arteries of extremities, unspecified extremity: Secondary | ICD-10-CM | POA: Diagnosis not present

## 2021-06-12 DIAGNOSIS — I639 Cerebral infarction, unspecified: Secondary | ICD-10-CM | POA: Diagnosis not present

## 2021-06-12 DIAGNOSIS — J811 Chronic pulmonary edema: Secondary | ICD-10-CM | POA: Diagnosis not present

## 2021-06-12 DIAGNOSIS — R0602 Shortness of breath: Secondary | ICD-10-CM | POA: Diagnosis not present

## 2021-06-12 DIAGNOSIS — I739 Peripheral vascular disease, unspecified: Secondary | ICD-10-CM | POA: Diagnosis not present

## 2021-06-12 DIAGNOSIS — Z952 Presence of prosthetic heart valve: Secondary | ICD-10-CM | POA: Diagnosis not present

## 2021-06-12 DIAGNOSIS — I70208 Unspecified atherosclerosis of native arteries of extremities, other extremity: Secondary | ICD-10-CM | POA: Diagnosis not present

## 2021-06-12 DIAGNOSIS — G8918 Other acute postprocedural pain: Secondary | ICD-10-CM | POA: Diagnosis not present

## 2021-06-12 DIAGNOSIS — Z9889 Other specified postprocedural states: Secondary | ICD-10-CM | POA: Diagnosis not present

## 2021-06-13 DIAGNOSIS — R0602 Shortness of breath: Secondary | ICD-10-CM | POA: Diagnosis not present

## 2021-06-13 DIAGNOSIS — Z95811 Presence of heart assist device: Secondary | ICD-10-CM | POA: Diagnosis not present

## 2021-06-13 DIAGNOSIS — I70208 Unspecified atherosclerosis of native arteries of extremities, other extremity: Secondary | ICD-10-CM | POA: Diagnosis not present

## 2021-06-14 DIAGNOSIS — L7632 Postprocedural hematoma of skin and subcutaneous tissue following other procedure: Secondary | ICD-10-CM | POA: Diagnosis not present

## 2021-06-14 DIAGNOSIS — I272 Pulmonary hypertension, unspecified: Secondary | ICD-10-CM | POA: Diagnosis not present

## 2021-06-14 DIAGNOSIS — Z9889 Other specified postprocedural states: Secondary | ICD-10-CM | POA: Diagnosis not present

## 2021-06-14 DIAGNOSIS — I70208 Unspecified atherosclerosis of native arteries of extremities, other extremity: Secondary | ICD-10-CM | POA: Diagnosis not present

## 2021-06-14 DIAGNOSIS — I96 Gangrene, not elsewhere classified: Secondary | ICD-10-CM | POA: Diagnosis not present

## 2021-06-14 DIAGNOSIS — Z95811 Presence of heart assist device: Secondary | ICD-10-CM | POA: Diagnosis not present

## 2021-06-14 DIAGNOSIS — G8918 Other acute postprocedural pain: Secondary | ICD-10-CM | POA: Diagnosis not present

## 2021-06-15 ENCOUNTER — Telehealth: Payer: Self-pay | Admitting: *Deleted

## 2021-06-15 ENCOUNTER — Telehealth: Payer: Medicare Other

## 2021-06-15 DIAGNOSIS — Z95811 Presence of heart assist device: Secondary | ICD-10-CM | POA: Diagnosis not present

## 2021-06-15 DIAGNOSIS — I739 Peripheral vascular disease, unspecified: Secondary | ICD-10-CM | POA: Diagnosis not present

## 2021-06-15 DIAGNOSIS — I70208 Unspecified atherosclerosis of native arteries of extremities, other extremity: Secondary | ICD-10-CM | POA: Diagnosis not present

## 2021-06-15 NOTE — Telephone Encounter (Signed)
  Care Management   Follow Up Note   06/15/2021 Name: Ashley [name] MRN: [id number] DOB: Nov 19, 1950   Referred by: [name], MD Reason for referral : Chronic Care Management (CHF/DM/Diverticulitis)   Patient continues to be hospitalized at [facility].   Plans for possible surgery today.  Follow Up Plan:  RNCM will continue to monitor chart for patients discharge to home schedule CCM follow up.  Hubert Azure RN, MSN RN Care Management Coordinator [place] [phone] [email or web address]

## 2021-06-16 DIAGNOSIS — I70208 Unspecified atherosclerosis of native arteries of extremities, other extremity: Secondary | ICD-10-CM | POA: Diagnosis not present

## 2021-06-16 DIAGNOSIS — Z95811 Presence of heart assist device: Secondary | ICD-10-CM | POA: Diagnosis not present

## 2021-06-17 DIAGNOSIS — I70208 Unspecified atherosclerosis of native arteries of extremities, other extremity: Secondary | ICD-10-CM | POA: Diagnosis not present

## 2021-06-17 DIAGNOSIS — Z95811 Presence of heart assist device: Secondary | ICD-10-CM | POA: Diagnosis not present

## 2021-06-18 DIAGNOSIS — I70208 Unspecified atherosclerosis of native arteries of extremities, other extremity: Secondary | ICD-10-CM | POA: Diagnosis not present

## 2021-06-18 DIAGNOSIS — Z95811 Presence of heart assist device: Secondary | ICD-10-CM | POA: Diagnosis not present

## 2021-06-19 DIAGNOSIS — I5022 Chronic systolic (congestive) heart failure: Secondary | ICD-10-CM | POA: Diagnosis not present

## 2021-06-19 DIAGNOSIS — I70208 Unspecified atherosclerosis of native arteries of extremities, other extremity: Secondary | ICD-10-CM | POA: Diagnosis not present

## 2021-06-19 DIAGNOSIS — Z95811 Presence of heart assist device: Secondary | ICD-10-CM | POA: Diagnosis not present

## 2021-06-20 DIAGNOSIS — I739 Peripheral vascular disease, unspecified: Secondary | ICD-10-CM | POA: Diagnosis not present

## 2021-06-20 DIAGNOSIS — N390 Urinary tract infection, site not specified: Secondary | ICD-10-CM | POA: Diagnosis not present

## 2021-06-20 DIAGNOSIS — Z95811 Presence of heart assist device: Secondary | ICD-10-CM | POA: Diagnosis not present

## 2021-06-20 DIAGNOSIS — B952 Enterococcus as the cause of diseases classified elsewhere: Secondary | ICD-10-CM | POA: Diagnosis not present

## 2021-06-20 DIAGNOSIS — H538 Other visual disturbances: Secondary | ICD-10-CM | POA: Diagnosis not present

## 2021-06-20 DIAGNOSIS — Z1624 Resistance to multiple antibiotics: Secondary | ICD-10-CM | POA: Diagnosis not present

## 2021-06-20 DIAGNOSIS — Z7901 Long term (current) use of anticoagulants: Secondary | ICD-10-CM | POA: Diagnosis not present

## 2021-06-20 DIAGNOSIS — Z9889 Other specified postprocedural states: Secondary | ICD-10-CM | POA: Diagnosis not present

## 2021-06-20 DIAGNOSIS — M79602 Pain in left arm: Secondary | ICD-10-CM | POA: Diagnosis not present

## 2021-06-20 DIAGNOSIS — A498 Other bacterial infections of unspecified site: Secondary | ICD-10-CM | POA: Diagnosis not present

## 2021-06-20 DIAGNOSIS — J449 Chronic obstructive pulmonary disease, unspecified: Secondary | ICD-10-CM | POA: Diagnosis not present

## 2021-06-20 DIAGNOSIS — I5022 Chronic systolic (congestive) heart failure: Secondary | ICD-10-CM | POA: Diagnosis not present

## 2021-06-20 DIAGNOSIS — I639 Cerebral infarction, unspecified: Secondary | ICD-10-CM | POA: Diagnosis not present

## 2021-06-20 DIAGNOSIS — I70208 Unspecified atherosclerosis of native arteries of extremities, other extremity: Secondary | ICD-10-CM | POA: Diagnosis not present

## 2021-06-21 DIAGNOSIS — J449 Chronic obstructive pulmonary disease, unspecified: Secondary | ICD-10-CM | POA: Diagnosis not present

## 2021-06-21 DIAGNOSIS — B952 Enterococcus as the cause of diseases classified elsewhere: Secondary | ICD-10-CM | POA: Diagnosis not present

## 2021-06-21 DIAGNOSIS — Z7901 Long term (current) use of anticoagulants: Secondary | ICD-10-CM | POA: Diagnosis not present

## 2021-06-21 DIAGNOSIS — H538 Other visual disturbances: Secondary | ICD-10-CM | POA: Diagnosis not present

## 2021-06-21 DIAGNOSIS — N390 Urinary tract infection, site not specified: Secondary | ICD-10-CM | POA: Diagnosis not present

## 2021-06-21 DIAGNOSIS — I739 Peripheral vascular disease, unspecified: Secondary | ICD-10-CM | POA: Diagnosis not present

## 2021-06-21 DIAGNOSIS — I70208 Unspecified atherosclerosis of native arteries of extremities, other extremity: Secondary | ICD-10-CM | POA: Diagnosis not present

## 2021-06-21 DIAGNOSIS — Z95811 Presence of heart assist device: Secondary | ICD-10-CM | POA: Diagnosis not present

## 2021-06-21 DIAGNOSIS — Z9889 Other specified postprocedural states: Secondary | ICD-10-CM | POA: Diagnosis not present

## 2021-06-21 DIAGNOSIS — I5022 Chronic systolic (congestive) heart failure: Secondary | ICD-10-CM | POA: Diagnosis not present

## 2021-06-21 DIAGNOSIS — M79602 Pain in left arm: Secondary | ICD-10-CM | POA: Diagnosis not present

## 2021-06-21 DIAGNOSIS — I639 Cerebral infarction, unspecified: Secondary | ICD-10-CM | POA: Diagnosis not present

## 2021-06-22 DIAGNOSIS — N390 Urinary tract infection, site not specified: Secondary | ICD-10-CM | POA: Diagnosis not present

## 2021-06-22 DIAGNOSIS — I70208 Unspecified atherosclerosis of native arteries of extremities, other extremity: Secondary | ICD-10-CM | POA: Diagnosis not present

## 2021-06-22 DIAGNOSIS — Z95811 Presence of heart assist device: Secondary | ICD-10-CM | POA: Diagnosis not present

## 2021-06-22 DIAGNOSIS — B952 Enterococcus as the cause of diseases classified elsewhere: Secondary | ICD-10-CM | POA: Diagnosis not present

## 2021-06-22 DIAGNOSIS — I639 Cerebral infarction, unspecified: Secondary | ICD-10-CM | POA: Diagnosis not present

## 2021-06-22 DIAGNOSIS — I739 Peripheral vascular disease, unspecified: Secondary | ICD-10-CM | POA: Diagnosis not present

## 2021-06-22 DIAGNOSIS — Z1624 Resistance to multiple antibiotics: Secondary | ICD-10-CM | POA: Diagnosis not present

## 2021-06-22 DIAGNOSIS — A498 Other bacterial infections of unspecified site: Secondary | ICD-10-CM | POA: Diagnosis not present

## 2021-06-22 DIAGNOSIS — I5022 Chronic systolic (congestive) heart failure: Secondary | ICD-10-CM | POA: Diagnosis not present

## 2021-06-23 DIAGNOSIS — Z1624 Resistance to multiple antibiotics: Secondary | ICD-10-CM | POA: Diagnosis not present

## 2021-06-23 DIAGNOSIS — B952 Enterococcus as the cause of diseases classified elsewhere: Secondary | ICD-10-CM | POA: Diagnosis not present

## 2021-06-23 DIAGNOSIS — N390 Urinary tract infection, site not specified: Secondary | ICD-10-CM | POA: Diagnosis not present

## 2021-06-23 DIAGNOSIS — I639 Cerebral infarction, unspecified: Secondary | ICD-10-CM | POA: Diagnosis not present

## 2021-06-23 DIAGNOSIS — Z95811 Presence of heart assist device: Secondary | ICD-10-CM | POA: Diagnosis not present

## 2021-06-23 DIAGNOSIS — I739 Peripheral vascular disease, unspecified: Secondary | ICD-10-CM | POA: Diagnosis not present

## 2021-06-23 DIAGNOSIS — I70208 Unspecified atherosclerosis of native arteries of extremities, other extremity: Secondary | ICD-10-CM | POA: Diagnosis not present

## 2021-06-23 DIAGNOSIS — A498 Other bacterial infections of unspecified site: Secondary | ICD-10-CM | POA: Diagnosis not present

## 2021-06-23 DIAGNOSIS — I5022 Chronic systolic (congestive) heart failure: Secondary | ICD-10-CM | POA: Diagnosis not present

## 2021-06-24 ENCOUNTER — Telehealth: Payer: Self-pay | Admitting: *Deleted

## 2021-06-24 ENCOUNTER — Telehealth: Payer: Medicare Other

## 2021-06-24 DIAGNOSIS — M79602 Pain in left arm: Secondary | ICD-10-CM | POA: Diagnosis not present

## 2021-06-24 DIAGNOSIS — H538 Other visual disturbances: Secondary | ICD-10-CM | POA: Diagnosis not present

## 2021-06-24 DIAGNOSIS — I639 Cerebral infarction, unspecified: Secondary | ICD-10-CM | POA: Diagnosis not present

## 2021-06-24 DIAGNOSIS — I63532 Cerebral infarction due to unspecified occlusion or stenosis of left posterior cerebral artery: Secondary | ICD-10-CM | POA: Diagnosis not present

## 2021-06-24 DIAGNOSIS — Z95811 Presence of heart assist device: Secondary | ICD-10-CM | POA: Diagnosis not present

## 2021-06-24 DIAGNOSIS — I5022 Chronic systolic (congestive) heart failure: Secondary | ICD-10-CM | POA: Diagnosis not present

## 2021-06-24 DIAGNOSIS — I70208 Unspecified atherosclerosis of native arteries of extremities, other extremity: Secondary | ICD-10-CM | POA: Diagnosis not present

## 2021-06-24 DIAGNOSIS — Z9889 Other specified postprocedural states: Secondary | ICD-10-CM | POA: Diagnosis not present

## 2021-06-24 DIAGNOSIS — B952 Enterococcus as the cause of diseases classified elsewhere: Secondary | ICD-10-CM | POA: Diagnosis not present

## 2021-06-24 DIAGNOSIS — I739 Peripheral vascular disease, unspecified: Secondary | ICD-10-CM | POA: Diagnosis not present

## 2021-06-24 DIAGNOSIS — N390 Urinary tract infection, site not specified: Secondary | ICD-10-CM | POA: Diagnosis not present

## 2021-06-24 DIAGNOSIS — E039 Hypothyroidism, unspecified: Secondary | ICD-10-CM | POA: Diagnosis not present

## 2021-06-24 DIAGNOSIS — Z7901 Long term (current) use of anticoagulants: Secondary | ICD-10-CM | POA: Diagnosis not present

## 2021-06-24 DIAGNOSIS — J449 Chronic obstructive pulmonary disease, unspecified: Secondary | ICD-10-CM | POA: Diagnosis not present

## 2021-06-24 NOTE — Telephone Encounter (Signed)
  Care Management   Follow Up Note   06/24/2021 Name: Ashley [name] MRN: [id number] DOB: Aug 15, 1951   Referred by: [name], MD Reason for referral : Chronic Care Management (COPD, CHF)   Continues to be hospitalized at [facility].  Follow Up Plan: The care management team will reach out to the patient again over the next 15 days.   Ashley Azure RN, MSN RN Care Management Coordinator [place] [phone] [name]

## 2021-06-25 DIAGNOSIS — I639 Cerebral infarction, unspecified: Secondary | ICD-10-CM | POA: Diagnosis not present

## 2021-06-25 DIAGNOSIS — N390 Urinary tract infection, site not specified: Secondary | ICD-10-CM | POA: Diagnosis not present

## 2021-06-25 DIAGNOSIS — I70208 Unspecified atherosclerosis of native arteries of extremities, other extremity: Secondary | ICD-10-CM | POA: Diagnosis not present

## 2021-06-25 DIAGNOSIS — I5022 Chronic systolic (congestive) heart failure: Secondary | ICD-10-CM | POA: Diagnosis not present

## 2021-06-25 DIAGNOSIS — Z95811 Presence of heart assist device: Secondary | ICD-10-CM | POA: Diagnosis not present

## 2021-06-25 DIAGNOSIS — I739 Peripheral vascular disease, unspecified: Secondary | ICD-10-CM | POA: Diagnosis not present

## 2021-06-26 DIAGNOSIS — I739 Peripheral vascular disease, unspecified: Secondary | ICD-10-CM | POA: Diagnosis not present

## 2021-06-26 DIAGNOSIS — I639 Cerebral infarction, unspecified: Secondary | ICD-10-CM | POA: Diagnosis not present

## 2021-06-26 DIAGNOSIS — Z95811 Presence of heart assist device: Secondary | ICD-10-CM | POA: Diagnosis not present

## 2021-06-26 DIAGNOSIS — I70208 Unspecified atherosclerosis of native arteries of extremities, other extremity: Secondary | ICD-10-CM | POA: Diagnosis not present

## 2021-06-26 DIAGNOSIS — I5022 Chronic systolic (congestive) heart failure: Secondary | ICD-10-CM | POA: Diagnosis not present

## 2021-06-27 DIAGNOSIS — I5022 Chronic systolic (congestive) heart failure: Secondary | ICD-10-CM | POA: Diagnosis not present

## 2021-06-27 DIAGNOSIS — M79602 Pain in left arm: Secondary | ICD-10-CM | POA: Diagnosis not present

## 2021-06-27 DIAGNOSIS — Z8619 Personal history of other infectious and parasitic diseases: Secondary | ICD-10-CM | POA: Diagnosis not present

## 2021-06-27 DIAGNOSIS — Z7901 Long term (current) use of anticoagulants: Secondary | ICD-10-CM | POA: Diagnosis not present

## 2021-06-27 DIAGNOSIS — Z952 Presence of prosthetic heart valve: Secondary | ICD-10-CM | POA: Diagnosis not present

## 2021-06-27 DIAGNOSIS — G8918 Other acute postprocedural pain: Secondary | ICD-10-CM | POA: Diagnosis not present

## 2021-06-27 DIAGNOSIS — T829XXA Unspecified complication of cardiac and vascular prosthetic device, implant and graft, initial encounter: Secondary | ICD-10-CM | POA: Diagnosis not present

## 2021-06-27 DIAGNOSIS — I739 Peripheral vascular disease, unspecified: Secondary | ICD-10-CM | POA: Diagnosis not present

## 2021-06-27 DIAGNOSIS — I70208 Unspecified atherosclerosis of native arteries of extremities, other extremity: Secondary | ICD-10-CM | POA: Diagnosis not present

## 2021-06-27 DIAGNOSIS — N183 Chronic kidney disease, stage 3 unspecified: Secondary | ICD-10-CM | POA: Diagnosis not present

## 2021-06-27 DIAGNOSIS — T8142XA Infection following a procedure, deep incisional surgical site, initial encounter: Secondary | ICD-10-CM | POA: Diagnosis not present

## 2021-06-27 DIAGNOSIS — Z95811 Presence of heart assist device: Secondary | ICD-10-CM | POA: Diagnosis not present

## 2021-06-27 DIAGNOSIS — I639 Cerebral infarction, unspecified: Secondary | ICD-10-CM | POA: Diagnosis not present

## 2021-06-27 DIAGNOSIS — Z9889 Other specified postprocedural states: Secondary | ICD-10-CM | POA: Diagnosis not present

## 2021-06-28 DIAGNOSIS — T8142XA Infection following a procedure, deep incisional surgical site, initial encounter: Secondary | ICD-10-CM | POA: Diagnosis not present

## 2021-06-28 DIAGNOSIS — N183 Chronic kidney disease, stage 3 unspecified: Secondary | ICD-10-CM | POA: Diagnosis not present

## 2021-06-28 DIAGNOSIS — T8131XA Disruption of external operation (surgical) wound, not elsewhere classified, initial encounter: Secondary | ICD-10-CM | POA: Diagnosis not present

## 2021-06-28 DIAGNOSIS — D509 Iron deficiency anemia, unspecified: Secondary | ICD-10-CM | POA: Diagnosis not present

## 2021-06-28 DIAGNOSIS — I639 Cerebral infarction, unspecified: Secondary | ICD-10-CM | POA: Diagnosis not present

## 2021-06-28 DIAGNOSIS — Z9889 Other specified postprocedural states: Secondary | ICD-10-CM | POA: Diagnosis not present

## 2021-06-28 DIAGNOSIS — I739 Peripheral vascular disease, unspecified: Secondary | ICD-10-CM | POA: Diagnosis not present

## 2021-06-28 DIAGNOSIS — T829XXA Unspecified complication of cardiac and vascular prosthetic device, implant and graft, initial encounter: Secondary | ICD-10-CM | POA: Diagnosis not present

## 2021-06-28 DIAGNOSIS — Z8619 Personal history of other infectious and parasitic diseases: Secondary | ICD-10-CM | POA: Diagnosis not present

## 2021-06-28 DIAGNOSIS — S51802A Unspecified open wound of left forearm, initial encounter: Secondary | ICD-10-CM | POA: Diagnosis not present

## 2021-06-28 DIAGNOSIS — I70208 Unspecified atherosclerosis of native arteries of extremities, other extremity: Secondary | ICD-10-CM | POA: Diagnosis not present

## 2021-06-28 DIAGNOSIS — G8918 Other acute postprocedural pain: Secondary | ICD-10-CM | POA: Diagnosis not present

## 2021-06-28 DIAGNOSIS — Z428 Encounter for other plastic and reconstructive surgery following medical procedure or healed injury: Secondary | ICD-10-CM | POA: Diagnosis not present

## 2021-06-28 DIAGNOSIS — Z7901 Long term (current) use of anticoagulants: Secondary | ICD-10-CM | POA: Diagnosis not present

## 2021-06-29 DIAGNOSIS — Z7901 Long term (current) use of anticoagulants: Secondary | ICD-10-CM | POA: Diagnosis not present

## 2021-06-29 DIAGNOSIS — I70208 Unspecified atherosclerosis of native arteries of extremities, other extremity: Secondary | ICD-10-CM | POA: Diagnosis not present

## 2021-06-29 DIAGNOSIS — G8918 Other acute postprocedural pain: Secondary | ICD-10-CM | POA: Diagnosis not present

## 2021-06-29 DIAGNOSIS — I639 Cerebral infarction, unspecified: Secondary | ICD-10-CM | POA: Diagnosis not present

## 2021-06-29 DIAGNOSIS — I739 Peripheral vascular disease, unspecified: Secondary | ICD-10-CM | POA: Diagnosis not present

## 2021-06-30 DIAGNOSIS — T8142XA Infection following a procedure, deep incisional surgical site, initial encounter: Secondary | ICD-10-CM | POA: Diagnosis not present

## 2021-06-30 DIAGNOSIS — I739 Peripheral vascular disease, unspecified: Secondary | ICD-10-CM | POA: Diagnosis not present

## 2021-06-30 DIAGNOSIS — Z7901 Long term (current) use of anticoagulants: Secondary | ICD-10-CM | POA: Diagnosis not present

## 2021-06-30 DIAGNOSIS — Z952 Presence of prosthetic heart valve: Secondary | ICD-10-CM | POA: Diagnosis not present

## 2021-06-30 DIAGNOSIS — G8918 Other acute postprocedural pain: Secondary | ICD-10-CM | POA: Diagnosis not present

## 2021-06-30 DIAGNOSIS — Z9889 Other specified postprocedural states: Secondary | ICD-10-CM | POA: Diagnosis not present

## 2021-06-30 DIAGNOSIS — M79602 Pain in left arm: Secondary | ICD-10-CM | POA: Diagnosis not present

## 2021-06-30 DIAGNOSIS — T829XXA Unspecified complication of cardiac and vascular prosthetic device, implant and graft, initial encounter: Secondary | ICD-10-CM | POA: Diagnosis not present

## 2021-06-30 DIAGNOSIS — Z95811 Presence of heart assist device: Secondary | ICD-10-CM | POA: Diagnosis not present

## 2021-06-30 DIAGNOSIS — I5022 Chronic systolic (congestive) heart failure: Secondary | ICD-10-CM | POA: Diagnosis not present

## 2021-06-30 DIAGNOSIS — N183 Chronic kidney disease, stage 3 unspecified: Secondary | ICD-10-CM | POA: Diagnosis not present

## 2021-06-30 DIAGNOSIS — I70208 Unspecified atherosclerosis of native arteries of extremities, other extremity: Secondary | ICD-10-CM | POA: Diagnosis not present

## 2021-06-30 DIAGNOSIS — Z8619 Personal history of other infectious and parasitic diseases: Secondary | ICD-10-CM | POA: Diagnosis not present

## 2021-06-30 DIAGNOSIS — I639 Cerebral infarction, unspecified: Secondary | ICD-10-CM | POA: Diagnosis not present

## 2021-07-01 ENCOUNTER — Telehealth: Payer: Medicare Other

## 2021-07-01 DIAGNOSIS — I70208 Unspecified atherosclerosis of native arteries of extremities, other extremity: Secondary | ICD-10-CM | POA: Diagnosis not present

## 2021-07-01 DIAGNOSIS — Z952 Presence of prosthetic heart valve: Secondary | ICD-10-CM | POA: Diagnosis not present

## 2021-07-01 DIAGNOSIS — Z95811 Presence of heart assist device: Secondary | ICD-10-CM | POA: Diagnosis not present

## 2021-07-01 DIAGNOSIS — I739 Peripheral vascular disease, unspecified: Secondary | ICD-10-CM | POA: Diagnosis not present

## 2021-07-01 DIAGNOSIS — Z7901 Long term (current) use of anticoagulants: Secondary | ICD-10-CM | POA: Diagnosis not present

## 2021-07-01 DIAGNOSIS — Z9889 Other specified postprocedural states: Secondary | ICD-10-CM | POA: Diagnosis not present

## 2021-07-01 DIAGNOSIS — I5022 Chronic systolic (congestive) heart failure: Secondary | ICD-10-CM | POA: Diagnosis not present

## 2021-07-01 DIAGNOSIS — M79602 Pain in left arm: Secondary | ICD-10-CM | POA: Diagnosis not present

## 2021-07-01 DIAGNOSIS — Z792 Long term (current) use of antibiotics: Secondary | ICD-10-CM | POA: Diagnosis not present

## 2021-07-01 DIAGNOSIS — T829XXA Unspecified complication of cardiac and vascular prosthetic device, implant and graft, initial encounter: Secondary | ICD-10-CM | POA: Diagnosis not present

## 2021-07-01 DIAGNOSIS — T8149XA Infection following a procedure, other surgical site, initial encounter: Secondary | ICD-10-CM | POA: Diagnosis not present

## 2021-07-01 DIAGNOSIS — I639 Cerebral infarction, unspecified: Secondary | ICD-10-CM | POA: Diagnosis not present

## 2021-07-01 DIAGNOSIS — G8918 Other acute postprocedural pain: Secondary | ICD-10-CM | POA: Diagnosis not present

## 2021-07-02 DIAGNOSIS — I70208 Unspecified atherosclerosis of native arteries of extremities, other extremity: Secondary | ICD-10-CM | POA: Diagnosis not present

## 2021-07-02 DIAGNOSIS — I639 Cerebral infarction, unspecified: Secondary | ICD-10-CM | POA: Diagnosis not present

## 2021-07-02 DIAGNOSIS — I739 Peripheral vascular disease, unspecified: Secondary | ICD-10-CM | POA: Diagnosis not present

## 2021-07-02 DIAGNOSIS — T829XXA Unspecified complication of cardiac and vascular prosthetic device, implant and graft, initial encounter: Secondary | ICD-10-CM | POA: Diagnosis not present

## 2021-07-02 DIAGNOSIS — Z7901 Long term (current) use of anticoagulants: Secondary | ICD-10-CM | POA: Diagnosis not present

## 2021-07-02 DIAGNOSIS — G8918 Other acute postprocedural pain: Secondary | ICD-10-CM | POA: Diagnosis not present

## 2021-07-03 DIAGNOSIS — I739 Peripheral vascular disease, unspecified: Secondary | ICD-10-CM | POA: Diagnosis not present

## 2021-07-03 DIAGNOSIS — I70208 Unspecified atherosclerosis of native arteries of extremities, other extremity: Secondary | ICD-10-CM | POA: Diagnosis not present

## 2021-07-03 DIAGNOSIS — Z7901 Long term (current) use of anticoagulants: Secondary | ICD-10-CM | POA: Diagnosis not present

## 2021-07-03 DIAGNOSIS — G8918 Other acute postprocedural pain: Secondary | ICD-10-CM | POA: Diagnosis not present

## 2021-07-03 DIAGNOSIS — I639 Cerebral infarction, unspecified: Secondary | ICD-10-CM | POA: Diagnosis not present

## 2021-07-04 DIAGNOSIS — Z95811 Presence of heart assist device: Secondary | ICD-10-CM | POA: Diagnosis not present

## 2021-07-05 DIAGNOSIS — I639 Cerebral infarction, unspecified: Secondary | ICD-10-CM | POA: Diagnosis not present

## 2021-07-05 DIAGNOSIS — Z9889 Other specified postprocedural states: Secondary | ICD-10-CM | POA: Diagnosis not present

## 2021-07-05 DIAGNOSIS — A498 Other bacterial infections of unspecified site: Secondary | ICD-10-CM | POA: Diagnosis not present

## 2021-07-05 DIAGNOSIS — Z7901 Long term (current) use of anticoagulants: Secondary | ICD-10-CM | POA: Diagnosis not present

## 2021-07-05 DIAGNOSIS — Z95811 Presence of heart assist device: Secondary | ICD-10-CM | POA: Diagnosis not present

## 2021-07-05 DIAGNOSIS — I739 Peripheral vascular disease, unspecified: Secondary | ICD-10-CM | POA: Diagnosis not present

## 2021-07-06 DIAGNOSIS — Z95811 Presence of heart assist device: Secondary | ICD-10-CM | POA: Diagnosis not present

## 2021-07-06 DIAGNOSIS — T8189XA Other complications of procedures, not elsewhere classified, initial encounter: Secondary | ICD-10-CM | POA: Diagnosis not present

## 2021-07-07 DIAGNOSIS — S51802A Unspecified open wound of left forearm, initial encounter: Secondary | ICD-10-CM | POA: Diagnosis not present

## 2021-07-07 DIAGNOSIS — Z428 Encounter for other plastic and reconstructive surgery following medical procedure or healed injury: Secondary | ICD-10-CM | POA: Diagnosis not present

## 2021-07-07 DIAGNOSIS — Z95811 Presence of heart assist device: Secondary | ICD-10-CM | POA: Diagnosis not present

## 2021-07-08 DIAGNOSIS — Z945 Skin transplant status: Secondary | ICD-10-CM | POA: Diagnosis not present

## 2021-07-08 DIAGNOSIS — Z95811 Presence of heart assist device: Secondary | ICD-10-CM | POA: Diagnosis not present

## 2021-07-08 DIAGNOSIS — A498 Other bacterial infections of unspecified site: Secondary | ICD-10-CM | POA: Diagnosis not present

## 2021-07-08 DIAGNOSIS — I739 Peripheral vascular disease, unspecified: Secondary | ICD-10-CM | POA: Diagnosis not present

## 2021-07-08 DIAGNOSIS — I639 Cerebral infarction, unspecified: Secondary | ICD-10-CM | POA: Diagnosis not present

## 2021-07-08 DIAGNOSIS — Z9889 Other specified postprocedural states: Secondary | ICD-10-CM | POA: Diagnosis not present

## 2021-07-08 DIAGNOSIS — Z7901 Long term (current) use of anticoagulants: Secondary | ICD-10-CM | POA: Diagnosis not present

## 2021-07-09 DIAGNOSIS — Z95811 Presence of heart assist device: Secondary | ICD-10-CM | POA: Diagnosis not present

## 2021-07-10 DIAGNOSIS — Z95811 Presence of heart assist device: Secondary | ICD-10-CM | POA: Diagnosis not present

## 2021-07-11 DIAGNOSIS — Z95811 Presence of heart assist device: Secondary | ICD-10-CM | POA: Diagnosis not present

## 2021-07-11 DIAGNOSIS — Z9889 Other specified postprocedural states: Secondary | ICD-10-CM | POA: Diagnosis not present

## 2021-07-11 DIAGNOSIS — Z7901 Long term (current) use of anticoagulants: Secondary | ICD-10-CM | POA: Diagnosis not present

## 2021-07-11 DIAGNOSIS — I739 Peripheral vascular disease, unspecified: Secondary | ICD-10-CM | POA: Diagnosis not present

## 2021-07-11 DIAGNOSIS — Z945 Skin transplant status: Secondary | ICD-10-CM | POA: Insufficient documentation

## 2021-07-11 DIAGNOSIS — A498 Other bacterial infections of unspecified site: Secondary | ICD-10-CM | POA: Diagnosis not present

## 2021-07-12 DIAGNOSIS — I639 Cerebral infarction, unspecified: Secondary | ICD-10-CM | POA: Diagnosis not present

## 2021-07-12 DIAGNOSIS — A498 Other bacterial infections of unspecified site: Secondary | ICD-10-CM | POA: Diagnosis not present

## 2021-07-12 DIAGNOSIS — Z9889 Other specified postprocedural states: Secondary | ICD-10-CM | POA: Diagnosis not present

## 2021-07-12 DIAGNOSIS — Z7901 Long term (current) use of anticoagulants: Secondary | ICD-10-CM | POA: Diagnosis not present

## 2021-07-12 DIAGNOSIS — I739 Peripheral vascular disease, unspecified: Secondary | ICD-10-CM | POA: Diagnosis not present

## 2021-07-12 DIAGNOSIS — Z945 Skin transplant status: Secondary | ICD-10-CM | POA: Diagnosis not present

## 2021-07-13 ENCOUNTER — Telehealth: Payer: Medicare Other

## 2021-07-13 DIAGNOSIS — Z95811 Presence of heart assist device: Secondary | ICD-10-CM | POA: Diagnosis not present

## 2021-07-13 DIAGNOSIS — Z7901 Long term (current) use of anticoagulants: Secondary | ICD-10-CM | POA: Diagnosis not present

## 2021-07-13 DIAGNOSIS — I739 Peripheral vascular disease, unspecified: Secondary | ICD-10-CM | POA: Diagnosis not present

## 2021-07-13 DIAGNOSIS — Z9889 Other specified postprocedural states: Secondary | ICD-10-CM | POA: Diagnosis not present

## 2021-07-13 DIAGNOSIS — A498 Other bacterial infections of unspecified site: Secondary | ICD-10-CM | POA: Diagnosis not present

## 2021-07-14 DIAGNOSIS — Z9889 Other specified postprocedural states: Secondary | ICD-10-CM | POA: Diagnosis not present

## 2021-07-14 DIAGNOSIS — Z95811 Presence of heart assist device: Secondary | ICD-10-CM | POA: Diagnosis not present

## 2021-07-14 DIAGNOSIS — A498 Other bacterial infections of unspecified site: Secondary | ICD-10-CM | POA: Diagnosis not present

## 2021-07-14 DIAGNOSIS — Z452 Encounter for adjustment and management of vascular access device: Secondary | ICD-10-CM | POA: Diagnosis not present

## 2021-07-14 DIAGNOSIS — Z7901 Long term (current) use of anticoagulants: Secondary | ICD-10-CM | POA: Diagnosis not present

## 2021-07-14 DIAGNOSIS — I739 Peripheral vascular disease, unspecified: Secondary | ICD-10-CM | POA: Diagnosis not present

## 2021-07-17 DIAGNOSIS — L89151 Pressure ulcer of sacral region, stage 1: Secondary | ICD-10-CM | POA: Diagnosis not present

## 2021-07-17 DIAGNOSIS — I130 Hypertensive heart and chronic kidney disease with heart failure and stage 1 through stage 4 chronic kidney disease, or unspecified chronic kidney disease: Secondary | ICD-10-CM | POA: Diagnosis not present

## 2021-07-17 DIAGNOSIS — E1122 Type 2 diabetes mellitus with diabetic chronic kidney disease: Secondary | ICD-10-CM | POA: Diagnosis not present

## 2021-07-17 DIAGNOSIS — T8149XD Infection following a procedure, other surgical site, subsequent encounter: Secondary | ICD-10-CM | POA: Diagnosis not present

## 2021-07-17 DIAGNOSIS — I5023 Acute on chronic systolic (congestive) heart failure: Secondary | ICD-10-CM | POA: Diagnosis not present

## 2021-07-18 ENCOUNTER — Other Ambulatory Visit: Payer: Self-pay | Admitting: Internal Medicine

## 2021-07-18 ENCOUNTER — Telehealth: Payer: Self-pay

## 2021-07-18 ENCOUNTER — Telehealth: Payer: Self-pay | Admitting: Internal Medicine

## 2021-07-18 NOTE — Telephone Encounter (Signed)
First available appt is 08/03/2021 at 11:30am. Is there a Thursday that I can add her to before then?

## 2021-07-18 NOTE — Telephone Encounter (Signed)
Transition Care Management Unsuccessful Follow-up Telephone Call  Date of discharge and from where:  07/15/21 -[facility]  Attempts:  1st Attempt  Reason for unsuccessful TCM follow-up call:  No answer/busy

## 2021-07-18 NOTE — Telephone Encounter (Signed)
Thank you.  Will follow.

## 2021-07-18 NOTE — Telephone Encounter (Signed)
Patient recently in the hospital for 45 days. Diane from [place] will be faxing over order from home health. States Patients medications need to be reviewed and reordered. Patient is out of a lot of meds.   Nurse calling in was not with the Patient and could not scheduled. Patient needing a call to schedule a hospital follow up.

## 2021-07-18 NOTE — Telephone Encounter (Signed)
Pt has been scheduled for a hospital follow up on Thursday at 1:30.

## 2021-07-19 DIAGNOSIS — Z4509 Encounter for adjustment and management of other cardiac device: Secondary | ICD-10-CM | POA: Diagnosis not present

## 2021-07-19 DIAGNOSIS — Z86718 Personal history of other venous thrombosis and embolism: Secondary | ICD-10-CM | POA: Diagnosis not present

## 2021-07-19 DIAGNOSIS — Z8719 Personal history of other diseases of the digestive system: Secondary | ICD-10-CM | POA: Diagnosis not present

## 2021-07-19 DIAGNOSIS — Z8673 Personal history of transient ischemic attack (TIA), and cerebral infarction without residual deficits: Secondary | ICD-10-CM | POA: Diagnosis not present

## 2021-07-19 DIAGNOSIS — E039 Hypothyroidism, unspecified: Secondary | ICD-10-CM | POA: Diagnosis not present

## 2021-07-19 DIAGNOSIS — I5022 Chronic systolic (congestive) heart failure: Secondary | ICD-10-CM | POA: Diagnosis not present

## 2021-07-19 DIAGNOSIS — D72829 Elevated white blood cell count, unspecified: Secondary | ICD-10-CM | POA: Diagnosis not present

## 2021-07-19 DIAGNOSIS — I10 Essential (primary) hypertension: Secondary | ICD-10-CM | POA: Diagnosis not present

## 2021-07-19 DIAGNOSIS — I110 Hypertensive heart disease with heart failure: Secondary | ICD-10-CM | POA: Diagnosis not present

## 2021-07-19 DIAGNOSIS — I742 Embolism and thrombosis of arteries of the upper extremities: Secondary | ICD-10-CM | POA: Diagnosis not present

## 2021-07-19 DIAGNOSIS — I255 Ischemic cardiomyopathy: Secondary | ICD-10-CM | POA: Diagnosis not present

## 2021-07-19 DIAGNOSIS — Z7901 Long term (current) use of anticoagulants: Secondary | ICD-10-CM | POA: Diagnosis not present

## 2021-07-19 DIAGNOSIS — Z95811 Presence of heart assist device: Secondary | ICD-10-CM | POA: Diagnosis not present

## 2021-07-19 DIAGNOSIS — I493 Ventricular premature depolarization: Secondary | ICD-10-CM | POA: Diagnosis not present

## 2021-07-19 DIAGNOSIS — Z79899 Other long term (current) drug therapy: Secondary | ICD-10-CM | POA: Diagnosis not present

## 2021-07-19 DIAGNOSIS — R000 Tachycardia, unspecified: Secondary | ICD-10-CM | POA: Diagnosis not present

## 2021-07-19 DIAGNOSIS — Z720 Tobacco use: Secondary | ICD-10-CM | POA: Diagnosis not present

## 2021-07-19 NOTE — Telephone Encounter (Signed)
Transition Care Management Follow-up Telephone Call Date of discharge and from where: 07/15/21 from [place]. How have you been since you were released from the hospital? Tired still but I feel like I am just about at my baseline. My L hand is healing from the graph taken during surgery. Dry and intact at site. 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 Denies chest pain, n/v/d and all other symptoms. Any questions or concerns? No  Items Reviewed: Did the pt receive and understand the discharge instructions provided? Yes  Medications obtained and verified? Yes . Carvedilol increased. Taking as directed.  Other? No  Any new allergies since your discharge? No Dietary orders reviewed? Yes Do you have support at home? Yes   Home Care and Equipment/Supplies: Were home health services ordered? yes [facility].   -PT to follow up with phone call  tomorrow.   Functional Questionnaire: (I = Independent and D = Dependent) ADLs: Husband assist as needed.   Bathing/Dressing- I  Eating- I  Maintaining continence- Wearing brief for bladder. Incontinence with bladder. Followed by LVAD team.   Transferring/Ambulation- I  Managing Meds- I  Follow up appointments reviewed:  PCP Hospital f/u appt confirmed? Yes  Scheduled to see pcp 07/21/21 on 1:30. [facility] f/u appt confirmed? Yes  Seen by Cardiology and LVAD team today. Are transportation arrangements needed? No  If their condition worsens, is the pt aware to call PCP or go to the Emergency Dept.? Yes Was the patient provided with contact information for the PCP's office or ED? Yes Was to pt encouraged to call back with questions or concerns? Yes

## 2021-07-20 DIAGNOSIS — I5023 Acute on chronic systolic (congestive) heart failure: Secondary | ICD-10-CM | POA: Diagnosis not present

## 2021-07-20 DIAGNOSIS — T8189XA Other complications of procedures, not elsewhere classified, initial encounter: Secondary | ICD-10-CM | POA: Diagnosis not present

## 2021-07-20 DIAGNOSIS — I130 Hypertensive heart and chronic kidney disease with heart failure and stage 1 through stage 4 chronic kidney disease, or unspecified chronic kidney disease: Secondary | ICD-10-CM | POA: Diagnosis not present

## 2021-07-20 DIAGNOSIS — E1122 Type 2 diabetes mellitus with diabetic chronic kidney disease: Secondary | ICD-10-CM | POA: Diagnosis not present

## 2021-07-20 DIAGNOSIS — T8149XD Infection following a procedure, other surgical site, subsequent encounter: Secondary | ICD-10-CM | POA: Diagnosis not present

## 2021-07-20 DIAGNOSIS — L89151 Pressure ulcer of sacral region, stage 1: Secondary | ICD-10-CM | POA: Diagnosis not present

## 2021-07-21 ENCOUNTER — Other Ambulatory Visit: Payer: Self-pay

## 2021-07-21 ENCOUNTER — Ambulatory Visit (INDEPENDENT_AMBULATORY_CARE_PROVIDER_SITE_OTHER): Payer: Medicare Other | Admitting: Internal Medicine

## 2021-07-21 ENCOUNTER — Inpatient Hospital Stay: Payer: Medicare Other | Admitting: Internal Medicine

## 2021-07-21 ENCOUNTER — Encounter: Payer: Self-pay | Admitting: Internal Medicine

## 2021-07-21 ENCOUNTER — Telehealth: Payer: Self-pay

## 2021-07-21 VITALS — HR 45 | Temp 96.0°F | Ht 63.0 in | Wt 147.0 lb

## 2021-07-21 DIAGNOSIS — S29012A Strain of muscle and tendon of back wall of thorax, initial encounter: Secondary | ICD-10-CM

## 2021-07-21 DIAGNOSIS — I10 Essential (primary) hypertension: Secondary | ICD-10-CM

## 2021-07-21 DIAGNOSIS — I25700 Atherosclerosis of coronary artery bypass graft(s), unspecified, with unstable angina pectoris: Secondary | ICD-10-CM | POA: Diagnosis not present

## 2021-07-21 DIAGNOSIS — Z5181 Encounter for therapeutic drug level monitoring: Secondary | ICD-10-CM

## 2021-07-21 DIAGNOSIS — I150 Renovascular hypertension: Secondary | ICD-10-CM

## 2021-07-21 DIAGNOSIS — T82110S Breakdown (mechanical) of cardiac electrode, sequela: Secondary | ICD-10-CM

## 2021-07-21 DIAGNOSIS — I6930 Unspecified sequelae of cerebral infarction: Secondary | ICD-10-CM | POA: Diagnosis not present

## 2021-07-21 DIAGNOSIS — Z95811 Presence of heart assist device: Secondary | ICD-10-CM

## 2021-07-21 DIAGNOSIS — Z9889 Other specified postprocedural states: Secondary | ICD-10-CM

## 2021-07-21 DIAGNOSIS — N3942 Incontinence without sensory awareness: Secondary | ICD-10-CM

## 2021-07-21 DIAGNOSIS — E039 Hypothyroidism, unspecified: Secondary | ICD-10-CM

## 2021-07-21 DIAGNOSIS — Z09 Encounter for follow-up examination after completed treatment for conditions other than malignant neoplasm: Secondary | ICD-10-CM

## 2021-07-21 DIAGNOSIS — Z7901 Long term (current) use of anticoagulants: Secondary | ICD-10-CM

## 2021-07-21 MED ORDER — LEVOTHYROXINE SODIUM 75 MCG PO TABS: 75.0000 ug | ORAL_TABLET | Freq: Every day | ORAL | 1 refills | Status: DC

## 2021-07-21 MED ORDER — TIZANIDINE HCL 2 MG PO TABS: 2.0000 mg | ORAL_TABLET | Freq: Four times a day (QID) | ORAL | 0 refills | Status: DC | PRN

## 2021-07-21 MED ORDER — HYDROMORPHONE HCL 2 MG PO TABS: 1.0000 mg | ORAL_TABLET | ORAL | 0 refills | Status: AC | PRN

## 2021-07-21 NOTE — Telephone Encounter (Signed)
Vanessa Lvad coordinator from [facility] wanted to call to let you patient had elevated TSH & BP when she was there. She wanted to make sure that we received fax, which the Lorriane Shire in our office confirmed that we had. She wants today OV notes faxed to her so that she knows that outcome of patient's visit today & what he BP was on office.

## 2021-07-21 NOTE — Progress Notes (Addendum)
Subjective:  Patient ID: Ashley [name], female    DOB: 1951-01-11  Age: 70 y.o. MRN: [id number]  CC: The primary encounter diagnosis was Failure of implantable cardioverter-defibrillator (ICD) lead, sequela. 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  HPI Ashley [name] presents for  Chief Complaint  Patient presents with   Hospitalization Follow-up    Pt was hospitalized for a stroke at 82    70 yr old female with ischemic cardiomyopathy S/p LVAD one year ago , presents for hospital follow up after a prolonged admission at [facility]  starting on July 28 when she presented with severe left arm pain.  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 During her hospitalization she required a thrombectomy and fasciotomy  of the left brachial artery/arm followed by  multiple debridements of the left upper arm due to wound infection  with mixed Gram negative organisms., which was treated with meropenem.   Unclear what her INR was at admission,  but at discharge on Sept 9 her goal INR was increased to 2-3 range    Hospitalization records reviewed with patient , as well as the follow up on Sept 13 , at which time her thyroid was noted to be underactive.   She missed her dose of coumadin last night (6 mg ) and was supposed to take 8 mg daily going forward based on INR from yesterday.    She is having considerable back pain since admission,  attributed to a twisting incident that resulted in pain and muscle spasm.   Denies spinal pain .  Has been Using dilaudid prescribed at discharge,   Rx was filled on sept 12   qty #38 filled . She has a history of hallucinating with oxycodone and nausea with hydrocodone. Reubin Milan take nsaids due to history of GIB and use of warfarin.   Also having new onset urinary continence  without sensory awareness since admission . Not having  fecal incontinence.   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   Home health has been out once, did not address the incontinence.   Warfarin dosing  was  6 mg daily. Was advised to take 8 mg.  Today and tomorrow but missed yesterday's dose.  Uses Home MD INR>   THYROID is currently UNDERACTIVE ON 50 MCG DAILY ,  NO DOSES MISSED  ,  but tshe  has been taking it incorrectly (IN THE AM WITH ALL OTHER MEDS RIGHT BEFORE BREAKFAST)   Does not have a local cardiologist although she used to see Dr Saralyn Pilar .  His office was contacted today to arrange an RN visit for doppler BP measurement but she was denied   She appears weary and weak, but not depressed.    Outpatient Medications Prior to Visit  Medication Sig Dispense Refill   acetaminophen (TYLENOL) 325 MG tablet Take 650 mg by mouth every 6 (six) hours as needed.     albuterol (PROAIR HFA) 108 (90 Base) MCG/ACT inhaler Inhale 1-2 puffs into the lungs every 6 (six) hours as needed for wheezing or shortness of breath.  8 g 2   allopurinol (ZYLOPRIM) 100 MG tablet TAKE ONE TABLET BY MOUTH ONCE DAILY 30 tablet 5   ALPRAZolam (XANAX) 0.5 MG tablet TAKE 1/2 TO 1 TABLET BY MOUTH AT BEDTIME AS NEEDED FOR ANXIETY 30 tablet 3   amitriptyline (ELAVIL) 25 MG tablet Take 1 tablet (25 mg total) by mouth at bedtime. 90 tablet 0   amLODipine (NORVASC) 2.5 MG tablet Take 2.5 mg by mouth daily.     atorvastatin (LIPITOR) 40 MG tablet TAKE ONE TABLET BY MOUTH ONCE DAILY 90 tablet 1   colchicine 0.6 MG tablet      cyanocobalamin (,VITAMIN B-12,) 1000 MCG/ML injection Inject 1 mL into the muscle monthly  (Patient taking differently: Inject 1,000 mcg into the muscle every 30 (thirty) days.) 10 mL 0   escitalopram (LEXAPRO) 10 MG tablet TAKE ONE TABLET BY MOUTH ONCE DAILY 90 tablet 1   ferrous sulfate 325 (65 FE) MG tablet Take 325 mg by mouth daily with breakfast.     fluticasone (FLONASE) 50 MCG/ACT nasal spray Place 1 spray into both nostrils as needed for allergies or rhinitis.     ipratropium-albuterol (DUONEB) 0.5-2.5 (3) MG/3ML SOLN USE 1 VIAL VIA NEBULIZER AND INHALE BY MOUTH INTO THE LUNGS EVERY 6 HOURS AS NEEDED 360 mL 1   magnesium oxide (MAG-OX) 400 (241.3 Mg) MG tablet Take 1 tablet by mouth every Monday, Wednesday, and Friday.     omeprazole (PRILOSEC) 40 MG capsule TAKE ONE CAPSULE BY MOUTH ONCE DAILY (Patient taking differently: Take 40 mg by mouth daily.) 90 capsule 1   senna-docusate (SENOKOT-S) 8.6-50 MG tablet Take by mouth.     torsemide (DEMADEX) 20 MG tablet Take 20 mg by mouth daily.     TRELEGY ELLIPTA 100-62.5-25 MCG/INH AEPB INHALE 1 PUFF BY MOUTH INTO THE LUNGS EVERY DAY 60 each 2   warfarin (COUMADIN) 2 MG tablet Take 6 mg by mouth at bedtime. Every day except on Thursday take 8mg .     folic acid (FOLVITE) 1 MG tablet TAKE ONE TABLET BY MOUTH ONCE DAILY 30 tablet 0   levothyroxine (SYNTHROID) 50 MCG tablet Take 1 tablet (50 mcg total) by mouth daily. 90 tablet 1   carvedilol (COREG) 25 MG tablet Take 25 mg by mouth 2 (two) times daily.     GNP MUCUS ER 600 MG 12 hr tablet Take 600 mg by mouth 2 (two) times daily. (Patient not taking: No sig reported)     mupirocin ointment (BACTROBAN) 2 % Apply topically daily.     potassium chloride (KLOR-CON) 10 MEQ tablet Take 10 mEq by mouth every Monday, Wednesday, and Friday. Take 2 tablets every Monday Wednesday Friday     sacubitril-valsartan (ENTRESTO) 97-103 MG Take 1 tablet by mouth 2 (two) times daily. (Patient not taking: Reported on 08/25/2021)     spironolactone (ALDACTONE) 25 MG tablet Take 25 mg by mouth daily.      traZODone (DESYREL) 50 MG tablet SMARTSIG:0.5-1 Tablet(s) By Mouth Every Evening     carvedilol (COREG) 6.25 MG tablet Take 6.25 mg by mouth 2 (two) times daily. (Patient not taking: Reported on 07/21/2021)     HYDROmorphone (DILAUDID) 2 MG tablet Take 2 mg by mouth every 4 (four) hours as needed.     No facility-administered medications prior to visit.    Review of Systems;  Patient denies headache, fevers, malaise, unintentional weight loss, skin rash, eye pain, sinus congestion and sinus pain, sore throat, dysphagia,  hemoptysis , cough, dyspnea, wheezing,  chest pain, palpitations, orthopnea, edema, abdominal pain, nausea, melena, diarrhea, constipation, flank pain, dysuria, hematuria, urinary  Frequency, nocturia,  tingling, seizures,   Loss of consciousness,  Tremor, insomnia, depression, anxiety, and suicidal ideation.      Objective:  Pulse (!) 45   Temp (!) 96 F (35.6 C) (Temporal)   Ht 5\' 3"  (1.6 m)   Wt 147 lb (66.7 kg)   SpO2 98%   BMI 26.04 kg/m   BP Readings from Last 3 Encounters:  07/25/21 (!) 90/56  05/06/21 98/70  07/05/20 102/70    Wt Readings from Last 3 Encounters:  08/25/21 148 lb (67.1 kg)  07/21/21 147 lb (66.7 kg)  05/10/21 155 lb (70.3 kg)    General appearance: alert, cooperative and appears stated age Ears: normal TM's and external ear canals both ears Throat: lips, mucosa, and tongue normal; teeth and gums normal Neck: no adenopathy, no carotid bruit, supple, symmetrical, trachea midline and thyroid not enlarged, symmetric, no tenderness/mass/nodules Back: symmetric, no curvature. ROM restricted,  no spinal pain ,  some paraspinous muscle spasm .  No CVA tenderness. Lungs: clear to auscultation bilaterally Heart: regular rate and rhythm, S1, S2 normal, no murmur, click, rub or gallop Abdomen: soft, non-tender; bowel sounds normal; no masses,  no organomegaly Pulses: 2+ and symmetric Ext: left arm bandaged. Left hand grip weak Skin: Skin color,  texture, turgor normal. No rashes or lesions MSK : weak hand grip on left  Lymph nodes: Cervical, supraclavicular, and axillary nodes normal.  Lab Results  Component Value Date   HGBA1C 5.0 05/06/2021   HGBA1C 4.7 10/15/2020   HGBA1C 5.9 07/05/2020    Lab Results  Component Value Date   CREATININE 1.53 (H) 05/06/2021   CREATININE 1.00 12/24/2020   CREATININE 1.31 (H) 07/05/2020    Lab Results  Component Value Date   WBC 6.4 05/06/2021   HGB 7.8 Repeated and verified X2. (LL) 05/06/2021   HCT 23.2 Repeated and verified X2. (LL) 05/06/2021   PLT 334.0 05/06/2021   GLUCOSE 102 (H) 05/06/2021   CHOL 155 01/01/2020   TRIG 70 01/01/2020   HDL 50 01/01/2020   LDLDIRECT 62.0 11/08/2018   LDLCALC 91 01/01/2020   ALT 8 05/06/2021   AST 11 05/06/2021   NA 140 05/06/2021   K 4.3 05/06/2021   CL 102 05/06/2021   CREATININE 1.53 (H) 05/06/2021   BUN 24 (H) 05/06/2021   CO2 26 05/06/2021   TSH 8.99 (H) 05/06/2021   INR 3.2 (H) 12/24/2020   HGBA1C 5.0 05/06/2021   MICROALBUR <0.7 07/05/2020       Assessment & Plan:   Problem List Items Addressed This Visit     HTN (hypertension) (Chronic)    PATIENT'S BP was not audible today because she has an LVAD and did not bring her doppler to the visit. (Primary care office does not have one),  [facility] refused to [facility] her for RN visit because they no longer consider her their patient. .  She will be scheduled for an RN visit HERE and advised to bring her doppler and the gel .  Will reestablish local cardiology care with [place].       Relevant Medications   carvedilol (COREG) 25 MG tablet   spironolactone (ALDACTONE) 25 MG tablet   Hypothyroidism    Thyroid is chronically underactive on current dose because of improper administration (taking in the morning with all other meds right before breakfast) .  Dosing reviewed.  Advised to take  one extra dose of 50 mcg weekly  Until she has finished the prescription, then fill new rx  for 75 mg daily.  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  She is anticoagulated with warfarin with goal INR 2-3.  DME for adult diapers written.      Muscle strain of left upper back    Exam notable for muscle strain/spasm; no spinal tenderness.  adding tizanidine and lowering dose of dilaudid to 1 mg ever 4 hours prn       Failure of implantable cardioverter-defibrillator (ICD) lead - Primary   Relevant Orders   Ambulatory referral to Cardiology   Hospital discharge follow-up    Patient is stable post discharge and has no new issues or questions about discharge plans at the visit today for hospital follow up. All labs , imaging studies and progress notes from admission were reviewed with patient today        Anticoagulation goal of INR 2 to 3    Her recent INR goal was increased. She was directed to incresae her warfarin  dose to 8 mg daily but d not tell them that she had had missed the previous night's dose of 6 mg.  Advised to increase dose to 10 mg daily,  With repeat INR on Friday       LVAD (left ventricular assist device) present ([place])    Placed in 2021 by [facility] for ischemic cardiomyopathy .  BP measurement was unobtainable d; she will return with doppler and gel.       Relevant Orders   Ambulatory referral to  Cardiology   History of fasciotomy    Required to manage left brachial artery occlusion and resulting devitalized tissue      Other Visit Diagnoses     Urinary incontinence without sensory awareness       Relevant Orders   For home use only DME Other see comment       I have changed Ashley [name] "Debbie"'s levothyroxine. I am also having her start on HYDROmorphone. Additionally, I am having her maintain her cyanocobalamin, torsemide, acetaminophen, colchicine, ALPRAZolam, potassium chloride, warfarin, GNP Mucus ER, omeprazole, Trelegy Ellipta, ferrous sulfate, fluticasone, amLODipine, magnesium oxide, sacubitril-valsartan, senna-docusate, albuterol, atorvastatin, escitalopram, amitriptyline, allopurinol, ipratropium-albuterol, carvedilol, mupirocin ointment, spironolactone, and traZODone.  Meds ordered this encounter  Medications   HYDROmorphone (DILAUDID) 2 MG tablet    Sig: Take 0.5 tablets (1 mg total) by mouth every 4 (four) hours as needed for up to 7 days for severe pain.    Dispense:  21 tablet    Refill:  0   DISCONTD: tiZANidine (ZANAFLEX) 2 MG tablet    Sig: Take 1 tablet (2 mg total) by mouth every 6 (six) hours as needed for muscle spasms.  Dispense:  30 tablet    Refill:  0   levothyroxine (SYNTHROID) 75 MCG tablet    Sig: Take 1 tablet (75 mcg total) by mouth daily.    Dispense:  90 tablet    Refill:  1    Please delay filling for 3 weeks NOT DOSE INCREASE    Medications Discontinued During This Encounter  Medication Reason   carvedilol (COREG) 6.25 MG tablet    levothyroxine (SYNTHROID) 50 MCG tablet     Follow-up: No follow-ups on file.   [name], MD

## 2021-07-21 NOTE — Assessment & Plan Note (Addendum)
PATIENT'S BP was not audible today because she has an LVAD and did not bring her doppler to the visit. (Primary care office does not have one),  [facility] refused to [facility] her for RN visit because they no longer consider her their patient. .  She will be scheduled for an RN visit HERE and advised to bring her doppler and the gel .  Will reestablish local cardiology care with [place].

## 2021-07-21 NOTE — Patient Instructions (Addendum)
Take 10  mg of  warfarin tonight and tomorrow since you missed yesterday's dose.   Let your coumadin manager know this   Your thyroid is underactive  because it HAS TO BE TAKEN ON AN EMPTY STOMACH  BY ITSELF,  45 MINUTE BEFORE ANY FOOD  OR DRINK OTHER THAN WATER.     increase  thyroid medication  as follows:  Take one  extra dose weekly each week on the same day  until  your pills are gone    I will refill the thyroid  medication at 75 mcg for 90 days going forward     Referral to [facility] is in process

## 2021-07-22 ENCOUNTER — Ambulatory Visit (INDEPENDENT_AMBULATORY_CARE_PROVIDER_SITE_OTHER): Payer: Medicare Other | Admitting: *Deleted

## 2021-07-22 DIAGNOSIS — L89151 Pressure ulcer of sacral region, stage 1: Secondary | ICD-10-CM | POA: Diagnosis not present

## 2021-07-22 DIAGNOSIS — I6930 Unspecified sequelae of cerebral infarction: Secondary | ICD-10-CM | POA: Insufficient documentation

## 2021-07-22 DIAGNOSIS — J441 Chronic obstructive pulmonary disease with (acute) exacerbation: Secondary | ICD-10-CM

## 2021-07-22 DIAGNOSIS — S29012A Strain of muscle and tendon of back wall of thorax, initial encounter: Secondary | ICD-10-CM | POA: Insufficient documentation

## 2021-07-22 DIAGNOSIS — I5023 Acute on chronic systolic (congestive) heart failure: Secondary | ICD-10-CM | POA: Diagnosis not present

## 2021-07-22 DIAGNOSIS — T8149XD Infection following a procedure, other surgical site, subsequent encounter: Secondary | ICD-10-CM | POA: Diagnosis not present

## 2021-07-22 DIAGNOSIS — I10 Essential (primary) hypertension: Secondary | ICD-10-CM

## 2021-07-22 DIAGNOSIS — I5032 Chronic diastolic (congestive) heart failure: Secondary | ICD-10-CM

## 2021-07-22 DIAGNOSIS — D62 Acute posthemorrhagic anemia: Secondary | ICD-10-CM

## 2021-07-22 DIAGNOSIS — E1122 Type 2 diabetes mellitus with diabetic chronic kidney disease: Secondary | ICD-10-CM | POA: Diagnosis not present

## 2021-07-22 DIAGNOSIS — I130 Hypertensive heart and chronic kidney disease with heart failure and stage 1 through stage 4 chronic kidney disease, or unspecified chronic kidney disease: Secondary | ICD-10-CM | POA: Diagnosis not present

## 2021-07-22 NOTE — Assessment & Plan Note (Addendum)
Thyroid is chronically underactive on current dose because of improper administration (taking in the morning with all other meds right before breakfast) .  Dosing reviewed.  Advised to take one extra dose of 50 mcg weekly  Until she has finished the prescription, then fill new rx for 75 mg daily.  Recheck 6 weeks

## 2021-07-22 NOTE — Assessment & Plan Note (Signed)
Her recent INR goal was increased. She was directed to incresae her warfarin  dose to 8 mg daily but d not tell them that she had had missed the previous night's dose of 6 mg.  Advised to increase dose to 10 mg daily,  With repeat INR on Friday

## 2021-07-22 NOTE — Assessment & Plan Note (Signed)
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  She is anticoagulated with warfarin with goal INR 2-3.  DME for adult diapers written.

## 2021-07-22 NOTE — Telephone Encounter (Signed)
Faxed

## 2021-07-22 NOTE — Assessment & Plan Note (Signed)
Required to manage left brachial artery occlusion and resulting devitalized tissue

## 2021-07-22 NOTE — Assessment & Plan Note (Signed)
Patient is stable post discharge and has no new issues or questions about discharge plans at the visit today for hospital follow up. All labs , imaging studies and progress notes from admission were reviewed with patient today   

## 2021-07-22 NOTE — Patient Instructions (Signed)
Visit Information  PATIENT GOALS:  Goals Addressed             This Visit's Progress    (RNCM) Track and Manage Fluids and Swelling   On track    Timeframe:  Long-Range Goal Priority:  High Start Date:  01/03/21                           Expected End Date:   10/05/21                    Follow Up Date 07/29/2021    Call office if I gain more than 2 pounds in one day or 5 pounds in one week Weigh myself daily and record in log along with LVAD daily parameters for provider review Monitor lower extremities and abdomen for swelling Take torsemide daily as prescribed Call LVAD team as soon as possible with increase in shortness of breath, do not wait days later Take doppler and BP cuff to next cardiology appointment to have staff assist you in learning how to use equipment Have fiance assist with monitoring/Doppler blood pressures daily  Why is this important?   It is important to check your weight daily and watch how much salt and liquids you have.  It will help you to manage your heart failure.    Notes:      (RNCM) Track and Manage My Symptoms-COPD   On track    Timeframe:  Long-Range Goal Priority:  High Start Date:  01/03/21                           Expected End Date:   01/03/23                    Follow Up Date 07/29/21    Develop a rescue plan and follow if symptoms flare up Monitor and Eliminate symptom triggers at home Obtain CO2 Detectors for home (contact local fire department to seek assistance in obtaining) Contact provider for increase use of rescue inhaler/nebulizer (use rescue inhaler and nebulizer for increase in shortness of breath) Call LVAD team for increase in shortness of breath on day 2 instead of waiting days later when it is worse   Why is this important?   Tracking your symptoms and other information about your health helps your doctor plan your care.  Write down the symptoms, the time of day, what you were doing and what medicine you are taking.  You  will soon learn how to manage your symptoms.     Notes:      (RNCM) Track My Symptoms-GI Bleed   Not on track    Timeframe:  Long-Range Goal Priority:  High Start Date: 01/03/21                            Expected End Date: 11/05/21                      Follow Up Date 07/29/21   Continue to monitor yourself for low hemoglobin (feeling tired, exhausted, pale, shortness of breath)--SEEK MEDICAL ATTENTION RIGHT AWAY Pay attention to LVAD alerts Monitor stools for blood Obtain home INR and call to Cardiology office for medication adjustment weekly as ordered Monitor incisions for s/s of infection Attend scheduled medical appointments Work with home health therapy    Why  is this important?   Keeping track of symptoms that you have helps you to understand your condition. You will also learn what works to manage it.  You and your doctor will then be able to come up with the best treatment plan for you.    Notes:         Patient verbalizes understanding of instructions provided today and agrees to view in [place].   The care management team will reach out to the patient again over the next 30 business days.   Hubert Azure RN, MSN RN Care Management Coordinator [place] [phone] [email or web address]

## 2021-07-22 NOTE — Chronic Care Management (AMB) (Signed)
Chronic Care Management   CCM RN Visit Note  07/22/2021 Name: Ashley [name] MRN: [id number] DOB: [birth year]  Subjective: Ashley [name] is a 70 y.o. year old female who is a primary care patient of [name], MD. The care management team was consulted for assistance with disease management and care coordination needs.    Engaged with patient by telephone for follow up visit in response to provider referral for case management and/or care coordination services.   Consent to Services:  The patient was given information about Chronic Care Management services, agreed to services, and gave verbal consent prior to initiation of services.  Please see initial visit note for detailed documentation.   Patient agreed to services and verbal consent obtained.   Assessment: Review of patient past medical history, allergies, medications, health status, including review of consultants reports, laboratory and other test data, was performed as part of comprehensive evaluation and provision of chronic care management services.   SDOH (Social Determinants of Health) assessments and interventions performed:    CCM Care Plan  Allergies  Allergen Reactions   Oxycodone     Other reaction(s): Hallucination Hallucinations to oxycodone noted while inpatient 06/2021. Tolerates hydromorphone.    Vancomycin Nausea And Vomiting and Palpitations   Hydrocodone-Acetaminophen Nausea Only    Outpatient Encounter Medications as of 07/22/2021  Medication Sig Note   acetaminophen (TYLENOL) 325 MG tablet Take 650 mg by mouth every 6 (six) hours as needed.    albuterol (PROAIR HFA) 108 (90 Base) MCG/ACT inhaler Inhale 1-2 puffs into the lungs every 6 (six) hours as needed for wheezing or shortness of breath.    allopurinol (ZYLOPRIM) 100 MG tablet TAKE ONE TABLET BY MOUTH ONCE DAILY    ALPRAZolam (XANAX) 0.5 MG tablet TAKE 1/2 TO 1 TABLET BY MOUTH AT BEDTIME AS NEEDED FOR ANXIETY    amitriptyline (ELAVIL) 25  MG tablet Take 1 tablet (25 mg total) by mouth at bedtime.    amLODipine (NORVASC) 2.5 MG tablet Take 2.5 mg by mouth daily.    atorvastatin (LIPITOR) 40 MG tablet TAKE ONE TABLET BY MOUTH ONCE DAILY    carvedilol (COREG) 25 MG tablet Take 25 mg by mouth 2 (two) times daily.    colchicine 0.6 MG tablet  01/03/2021: Reports taking only when gout flares up   cyanocobalamin (,VITAMIN B-12,) 1000 MCG/ML injection Inject 1 mL into the muscle monthly (Patient taking differently: Inject 1,000 mcg into the muscle every 30 (thirty) days.)    escitalopram (LEXAPRO) 10 MG tablet TAKE ONE TABLET BY MOUTH ONCE DAILY    ferrous sulfate 325 (65 FE) MG tablet Take 325 mg by mouth daily with breakfast.    fluticasone (FLONASE) 50 MCG/ACT nasal spray Place 1 spray into both nostrils as needed for allergies or rhinitis.    folic acid (FOLVITE) 1 MG tablet TAKE ONE TABLET BY MOUTH ONCE DAILY    GNP MUCUS ER 600 MG 12 hr tablet Take 600 mg by mouth 2 (two) times daily. (Patient not taking: Reported on 07/21/2021) 01/03/2021: Reports taking as needed   HYDROmorphone (DILAUDID) 2 MG tablet Take 2 mg by mouth every 4 (four) hours as needed.    [START ON 07/24/2021] HYDROmorphone (DILAUDID) 2 MG tablet Take 0.5 tablets (1 mg total) by mouth every 4 (four) hours as needed for up to 7 days for severe pain.    ipratropium-albuterol (DUONEB) 0.5-2.5 (3) MG/3ML SOLN USE 1 VIAL VIA NEBULIZER AND INHALE BY MOUTH INTO THE LUNGS EVERY 6  HOURS AS NEEDED    levothyroxine (SYNTHROID) 75 MCG tablet Take 1 tablet (75 mcg total) by mouth daily.    magnesium oxide (MAG-OX) 400 (241.3 Mg) MG tablet Take 1 tablet by mouth every Monday, Wednesday, and Friday.    mupirocin ointment (BACTROBAN) 2 % Apply topically daily.    omeprazole (PRILOSEC) 40 MG capsule TAKE ONE CAPSULE BY MOUTH ONCE DAILY (Patient taking differently: Take 40 mg by mouth in the morning and at bedtime.) 01/03/2021: Reports taking daily   potassium chloride (KLOR-CON) 10 MEQ  tablet Take 10 mEq by mouth every Monday, Wednesday, and Friday. Take 2 tablets every Monday Wednesday Friday (Patient not taking: Reported on 07/21/2021)    sacubitril-valsartan (ENTRESTO) 97-103 MG Take 1 tablet by mouth 2 (two) times daily. (Patient not taking: Reported on 07/21/2021)    senna-docusate (SENOKOT-S) 8.6-50 MG tablet Take by mouth.    spironolactone (ALDACTONE) 25 MG tablet Take 25 mg by mouth daily.    tiZANidine (ZANAFLEX) 2 MG tablet Take 1 tablet (2 mg total) by mouth every 6 (six) hours as needed for muscle spasms.    torsemide (DEMADEX) 20 MG tablet Take 20 mg by mouth daily.    traZODone (DESYREL) 50 MG tablet SMARTSIG:0.5-1 Tablet(s) By Mouth Every Evening    TRELEGY ELLIPTA 100-62.5-25 MCG/INH AEPB INHALE 1 PUFF BY MOUTH INTO THE LUNGS EVERY DAY    warfarin (COUMADIN) 2 MG tablet Take 4 mg by mouth at bedtime. Monday, Wednesday and Friday 3 mg and AOD takes 4 mg.    No facility-administered encounter medications on file as of 07/22/2021.    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   History of embolectomy 06/02/2021   History of fasciotomy 06/02/2021   Left arm pain 06/02/2021   Malignant neoplasm of colon, unspecified part of colon ([place]) 05/06/2021   Neuropathy 05/06/2021   Sternal wound infection 12/23/2020   Anticoagulation goal of INR 2 to 3 12/23/2020   LVAD (left ventricular assist device) present ([place]) 16/94/5038   Complication involving left ventricular assist device (LVAD) 12/23/2020   Hypothyroidism 12/23/2020   Infection following procedure 09/03/2020   Chronic anticoagulation 08/03/2020   S/P MVR (mitral valve replacement) 07/16/2020   Snoring 07/05/2020   CKD (chronic kidney disease), stage IIIa 12/31/2019   Pulmonary edema cardiac  cause ([place]) 12/25/2019   Hyperlipidemia    Gouty arthritis of both feet 11/20/2019   Mitral stenosis with insufficiency    Hypertensive urgency    Depression    Hyperglycemia 09/22/2019   Presence of permanent cardiac pacemaker    Acute blood loss anemia    Chronic kidney disease    Hypertension    CHF (congestive heart failure) ([place]) 07/16/2019   Insomnia 05/07/2019   Suspected COVID-19 virus infection 03/23/2019   AVM (arteriovenous malformation) of small bowel, acquired    Anemia, iron deficiency 11/10/2018   Rectal polyp    Benign neoplasm of cecum    Barrett's esophagus without dysplasia    Chronic diastolic heart failure ([place]) 07/03/2018   HTN (hypertension) 07/03/2018   COPD with emphysema ([place]) 06/28/2018   B12 deficiency anemia 06/28/2018   Personal history of colon cancer    Polyp of sigmoid colon    Diverticulosis of large intestine without diverticulitis    Renovascular  hypertension 10/03/2016   Renal artery stenosis ([facility]) 10/03/2016   Failure of implantable cardioverter-defibrillator (ICD) lead 02/09/2015   Hospital discharge follow-up 02/09/2015   Tobacco abuse 10/11/2014   S/P CABG x 3 07/03/2014   Atherosclerosis of native artery of extremity with intermittent claudication ([place]) 06/18/2013   Preoperative evaluation to rule out surgical contraindication 06/18/2013   CAD (coronary artery disease) 06/01/2013   GERD (gastroesophageal reflux disease) 06/01/2013   Hypercholesterolemia 06/01/2013   Tubular adenoma of colon 06/01/2013   Major depressive disorder in remission ([place]) 06/01/2013    Conditions to be addressed/monitored:CHF, COPD, and CVA, Thrombus  Care Plan : Heart Failure/COPD  Updates made by Leona Singleton, RN since 07/22/2021 12:00 AM     Problem: COPD and Heart Failure complications causing hospitalization   Priority: High     Long-Range Goal: Patient will report no COPD or heart failure exacerbations causing hospitalizations within the  next 90 days.   Start Date: 01/03/2021  Expected End Date: 01/03/2022  This Visit's Progress: On track  Recent Progress: On track  Priority: High  Note:   Current Barriers:  Knowledge deficit related to self care management of CHF and COPD as evidenced by recent hospitalization related to fluid retention and pneumonia.  Patient recently discharged after long hospitalization for forearm thrombectomy, embolic stroke with residual eye deficits and left sided weakness, and infection.  Has attended multiple follow up appointments.  Participating with home therapies.  States she is weak but getting better.  Does report back pain.  Weight this morning was 143 pounds (151-154 baseline).  Denies any shortness of breath or lower extremity edema.  Still not able to obtain blood pressure reading, due to difficulities with dopplering on her own, even with the assistance of her fiance'. Denies any LVAD alerts.  Reports compliance with [employer].    Case Manager Clinical Goal(s):  patient will verbalize understanding of Heart Failure and COPD Action Plan and when to call doctor patient will take all mediations as prescribed patient will weigh daily and record (notifying MD of 3 lb weight gain over night or 5 lb in a week) Patient will report no hospitalization within the next 90 days Interventions:  Collaboration with [name], MD regarding development and update of comprehensive plan of care as evidenced by provider attestation and co-signature Inter-disciplinary care team collaboration (see longitudinal plan of care) Discussed with patient when to weigh each morning after emptying bladder and placing battery in LVAD per instructions Discussed importance of daily weight and advised patient to weigh and record daily; encouraged patient to use 2022 Calendar booklet to document weights to keep track of increase for review and provider review (instead of trying to remember from day to day) Reviewed role of  diuretics in prevention of fluid overload and management of heart failure, encouraged patient to monitor symptoms and notify provider if increase in fluid retention, weight, or shortness of breath Reviewed medications and encouraged medication compliance Encouraged patient to obtain home CO2 detectors and to contact local Fire Department to verify any programs supplying families with CO2 detectors, reviewed where patient could purchase from Discussed COPD triggers including pollen and allergy season and action plan and when to use inhaler versus nebulizer Discussed use of nebulizer and rescue inhaler for shortness of breath to see if they help Confirmed patient has LVAD supplies; verified patient is changing per orders Verified patient has not had any LAVD alarms Discussed patient monitoring blood pressures daily using doppler per cardiology request and  encouraged patient to seek assistance from her fiance to help her doppler pressure and record reading Instructed and encouraged patient to take doppler and blood pressure cuff to next Cardiology appointment to have staff instruct her on correct use Encouraged patient to discuss blood pressure goal (high and low) with Cardiologist Provided patient emotional support and empathy with the recent loss of her nephew (continues to decline grief counseling) Discussed and encouraged using heat alternating with ice for back pain Encouraged to keep and attend scheduled medical appointments Reinforced with patient to contact her LVAD team for shortness of breath on day 1 or 2, not to wait until shortness of breath is too severe (day 4) for treatment options or instructions Patient Goals/Self-Care Activities Develop a rescue plan and follow if symptoms flare up Monitor and Eliminate symptom triggers at home Obtain CO2 Detectors for home (contact local fire department to seek assistance in obtaining) Contact provider for increase use of rescue inhaler/nebulizer  (use rescue inhaler and nebulizer for increase in shortness of breath) Call LVAD team for increase in shortness of breath on day 2 instead of waiting days later when it is worse Call office if I gain more than 2 pounds in one day or 5 pounds in one week Weigh myself daily and record in log along with LVAD daily parameters for provider review Monitor lower extremities and abdomen for swelling Take torsemide daily as prescribed Call LVAD team as soon as possible with increase in shortness of breath, do not wait days later Take doppler and BP cuff to next cardiology appointment to have staff assist you in learning how to use equipment Have fiance assist with monitoring/Doppler blood pressures daily Follow Up Plan: The care management team will reach out to the patient again over the next 27 business days.       Problem: Recurrent GI Bleeds leading to hospitalizations   Priority: High     Long-Range Goal: Patient will report no recurrent hospitalizations related to GI bleed in the next 90 days   Start Date: 01/03/2021  Expected End Date: 01/03/2022  This Visit's Progress: Not on track  Recent Progress: On track  Priority: High  Note:   Current Barriers:  Knowledge deficits related to self health management of anemia or bleeding as evidenced by recent recurrent hospitalizations.  Home INR machine is still working and patient has been checking weekly and reporting to Cardiology team.  Patient recently discharged after long hospitalization for forearm thrombectomy, embolic stroke with residual eye deficits and left sided weakness, and infection.  Has attended multiple follow up appointments.  Participating with home therapies.  States she is weak but getting better.  Does report back pain. Nurse Case Manager Clinical Goal(s):  patient will take all medications exactly as prescribed and will call provider for medication related questions patient will not experience hospital admission within the  next 90 days related to GI bleeding. Hospital Admissions in last 6 months =5 patient will attend all scheduled medical appointments: LVAD clinic/INR check; Cardiology 05/30/21 Interventions:  Collaboration with [name], MD regarding development and update of comprehensive plan of care as evidenced by provider attestation and co-signature Inter-disciplinary care team collaboration (see longitudinal plan of care) Medications reviewed and encouraged medication compliance Encouraged to continue to increase activity as tolerated; and to work with home health therapy Encouraged to elevate left arm as much as possible while resting Reviewed signs and symptoms of infection and encouraged patient to monitor incisions Encouraged sitting before standing and using  an assistive device; reviewed and reinforced fall precautions and preventions, encouraged patient to dangle before standing and to sit back down if feeling dizziness Encouraged dietary changes to increase dietary intake of iron, Vitamin P59 and folic acid as advised/prescribed Provided education about signs and symptoms of active bleeding and advised when to call provider or 911 Signs/symptoms of bleeding reviewed (weakness, exhaustion, paleness, sleepiness, blood in stool, dark tarry stools) Encouraged to contact provider/cardiology with next INR check weekly Patient Goals/Self-Care Activities: Continue to monitor yourself for low hemoglobin (feeling tired, exhausted, pale, shortness of breath)--SEEK MEDICAL ATTENTION RIGHT AWAY Pay attention to LVAD alerts Monitor stools for blood Obtain home INR and call to Cardiology office for medication adjustment weekly as ordered Monitor incisions for s/s of infection Attend scheduled medical appointments Work with home health therapy  Follow Up Plan: The care management team will reach out to the patient again over the next 27 business days.       Plan:The care management team will reach out  to the patient again over the next 27 business days.  Hubert Azure RN, MSN RN Care Management Coordinator House [phone] [email or web address]

## 2021-07-22 NOTE — Assessment & Plan Note (Signed)
Exam notable for muscle strain/spasm; no spinal tenderness.  adding tizanidine and lowering dose of dilaudid to 1 mg ever 4 hours prn

## 2021-07-22 NOTE — Assessment & Plan Note (Signed)
Placed in 2021 by [place] for ischemic cardiomyopathy .  BP measurement was unobtainable d; she will return with doppler and gel.

## 2021-07-25 ENCOUNTER — Ambulatory Visit (INDEPENDENT_AMBULATORY_CARE_PROVIDER_SITE_OTHER): Payer: Medicare Other

## 2021-07-25 ENCOUNTER — Other Ambulatory Visit: Payer: Self-pay

## 2021-07-25 VITALS — BP 90/56

## 2021-07-25 DIAGNOSIS — Z95811 Presence of heart assist device: Secondary | ICD-10-CM

## 2021-07-25 NOTE — Progress Notes (Signed)
Patient is here for a BP check with doppler due to bp being high at last ED visit, as per patient.  Currently patients BP is 90/56.  Patient has no complaints of headaches, blurry vision, chest pain, arm pain, light headedness, dizziness, and nor jaw pain. Please see previous note for order.

## 2021-07-26 DIAGNOSIS — I130 Hypertensive heart and chronic kidney disease with heart failure and stage 1 through stage 4 chronic kidney disease, or unspecified chronic kidney disease: Secondary | ICD-10-CM | POA: Diagnosis not present

## 2021-07-26 DIAGNOSIS — L89151 Pressure ulcer of sacral region, stage 1: Secondary | ICD-10-CM | POA: Diagnosis not present

## 2021-07-26 DIAGNOSIS — E1122 Type 2 diabetes mellitus with diabetic chronic kidney disease: Secondary | ICD-10-CM | POA: Diagnosis not present

## 2021-07-26 DIAGNOSIS — I5023 Acute on chronic systolic (congestive) heart failure: Secondary | ICD-10-CM | POA: Diagnosis not present

## 2021-07-26 DIAGNOSIS — T8149XD Infection following a procedure, other surgical site, subsequent encounter: Secondary | ICD-10-CM | POA: Diagnosis not present

## 2021-07-28 DIAGNOSIS — I130 Hypertensive heart and chronic kidney disease with heart failure and stage 1 through stage 4 chronic kidney disease, or unspecified chronic kidney disease: Secondary | ICD-10-CM | POA: Diagnosis not present

## 2021-07-28 DIAGNOSIS — L89151 Pressure ulcer of sacral region, stage 1: Secondary | ICD-10-CM | POA: Diagnosis not present

## 2021-07-28 DIAGNOSIS — T8149XD Infection following a procedure, other surgical site, subsequent encounter: Secondary | ICD-10-CM | POA: Diagnosis not present

## 2021-07-28 DIAGNOSIS — E1122 Type 2 diabetes mellitus with diabetic chronic kidney disease: Secondary | ICD-10-CM | POA: Diagnosis not present

## 2021-07-28 DIAGNOSIS — I5023 Acute on chronic systolic (congestive) heart failure: Secondary | ICD-10-CM | POA: Diagnosis not present

## 2021-07-29 ENCOUNTER — Ambulatory Visit: Payer: Medicare Other | Admitting: *Deleted

## 2021-07-29 DIAGNOSIS — J439 Emphysema, unspecified: Secondary | ICD-10-CM

## 2021-07-29 DIAGNOSIS — K5520 Angiodysplasia of colon without hemorrhage: Secondary | ICD-10-CM

## 2021-07-29 DIAGNOSIS — I5032 Chronic diastolic (congestive) heart failure: Secondary | ICD-10-CM

## 2021-07-29 NOTE — Chronic Care Management (AMB) (Signed)
Chronic Care Management   CCM RN Visit Note  07/29/2021 Name: Ashley [name] MRN: [id number] DOB: [birth year]  Subjective: ARRAYA BUCK is a 70 y.o. year old female who is a primary care patient of [name], MD. The care management team was consulted for assistance with disease management and care coordination needs.    Engaged with patient by telephone for follow up visit in response to provider referral for case management and/or care coordination services.   Consent to Services:  The patient was given information about Chronic Care Management services, agreed to services, and gave verbal consent prior to initiation of services.  Please see initial visit note for detailed documentation.   Patient agreed to services and verbal consent obtained.   Assessment: Review of patient past medical history, allergies, medications, health status, including review of consultants reports, laboratory and other test data, was performed as part of comprehensive evaluation and provision of chronic care management services.   SDOH (Social Determinants of Health) assessments and interventions performed:    CCM Care Plan  Allergies  Allergen Reactions   Oxycodone     Other reaction(s): Hallucination Hallucinations to oxycodone noted while inpatient 06/2021. Tolerates hydromorphone.    Vancomycin Nausea And Vomiting and Palpitations   Hydrocodone-Acetaminophen Nausea Only    Outpatient Encounter Medications as of 07/29/2021  Medication Sig Note   acetaminophen (TYLENOL) 325 MG tablet Take 650 mg by mouth every 6 (six) hours as needed.    albuterol (PROAIR HFA) 108 (90 Base) MCG/ACT inhaler Inhale 1-2 puffs into the lungs every 6 (six) hours as needed for wheezing or shortness of breath.    allopurinol (ZYLOPRIM) 100 MG tablet TAKE ONE TABLET BY MOUTH ONCE DAILY    ALPRAZolam (XANAX) 0.5 MG tablet TAKE 1/2 TO 1 TABLET BY MOUTH AT BEDTIME AS NEEDED FOR ANXIETY    amitriptyline (ELAVIL) 25  MG tablet Take 1 tablet (25 mg total) by mouth at bedtime.    amLODipine (NORVASC) 2.5 MG tablet Take 2.5 mg by mouth daily.    atorvastatin (LIPITOR) 40 MG tablet TAKE ONE TABLET BY MOUTH ONCE DAILY    carvedilol (COREG) 25 MG tablet Take 25 mg by mouth 2 (two) times daily.    colchicine 0.6 MG tablet  01/03/2021: Reports taking only when gout flares up   cyanocobalamin (,VITAMIN B-12,) 1000 MCG/ML injection Inject 1 mL into the muscle monthly (Patient taking differently: Inject 1,000 mcg into the muscle every 30 (thirty) days.)    escitalopram (LEXAPRO) 10 MG tablet TAKE ONE TABLET BY MOUTH ONCE DAILY    ferrous sulfate 325 (65 FE) MG tablet Take 325 mg by mouth daily with breakfast.    fluticasone (FLONASE) 50 MCG/ACT nasal spray Place 1 spray into both nostrils as needed for allergies or rhinitis.    folic acid (FOLVITE) 1 MG tablet TAKE ONE TABLET BY MOUTH ONCE DAILY    GNP MUCUS ER 600 MG 12 hr tablet Take 600 mg by mouth 2 (two) times daily. (Patient not taking: Reported on 07/21/2021) 01/03/2021: Reports taking as needed   HYDROmorphone (DILAUDID) 2 MG tablet Take 2 mg by mouth every 4 (four) hours as needed.    HYDROmorphone (DILAUDID) 2 MG tablet Take 0.5 tablets (1 mg total) by mouth every 4 (four) hours as needed for up to 7 days for severe pain.    ipratropium-albuterol (DUONEB) 0.5-2.5 (3) MG/3ML SOLN USE 1 VIAL VIA NEBULIZER AND INHALE BY MOUTH INTO THE LUNGS EVERY 6 HOURS AS NEEDED  levothyroxine (SYNTHROID) 75 MCG tablet Take 1 tablet (75 mcg total) by mouth daily.    magnesium oxide (MAG-OX) 400 (241.3 Mg) MG tablet Take 1 tablet by mouth every Monday, Wednesday, and Friday.    mupirocin ointment (BACTROBAN) 2 % Apply topically daily.    omeprazole (PRILOSEC) 40 MG capsule TAKE ONE CAPSULE BY MOUTH ONCE DAILY (Patient taking differently: Take 40 mg by mouth in the morning and at bedtime.) 01/03/2021: Reports taking daily   potassium chloride (KLOR-CON) 10 MEQ tablet Take 10 mEq by  mouth every Monday, Wednesday, and Friday. Take 2 tablets every Monday Wednesday Friday (Patient not taking: Reported on 07/21/2021)    sacubitril-valsartan (ENTRESTO) 97-103 MG Take 1 tablet by mouth 2 (two) times daily. (Patient not taking: Reported on 07/21/2021)    senna-docusate (SENOKOT-S) 8.6-50 MG tablet Take by mouth.    spironolactone (ALDACTONE) 25 MG tablet Take 25 mg by mouth daily.    tiZANidine (ZANAFLEX) 2 MG tablet Take 1 tablet (2 mg total) by mouth every 6 (six) hours as needed for muscle spasms.    torsemide (DEMADEX) 20 MG tablet Take 20 mg by mouth daily.    traZODone (DESYREL) 50 MG tablet SMARTSIG:0.5-1 Tablet(s) By Mouth Every Evening    TRELEGY ELLIPTA 100-62.5-25 MCG/INH AEPB INHALE 1 PUFF BY MOUTH INTO THE LUNGS EVERY DAY    warfarin (COUMADIN) 2 MG tablet Take 4 mg by mouth at bedtime. Monday, Wednesday and Friday 3 mg and AOD takes 4 mg.    No facility-administered encounter medications on file as of 07/29/2021.    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   History of embolectomy 06/02/2021   History of fasciotomy 06/02/2021   Left arm pain 06/02/2021   Malignant neoplasm of colon, unspecified part of colon ([place]) 05/06/2021   Neuropathy 05/06/2021   PAF (paroxysmal atrial fibrillation) ([place]) 02/10/2021   Palpitation 02/10/2021   Red blood cell antibody positive 12/27/2020   Sternal wound infection 12/23/2020   Anticoagulation goal of INR 2 to 3 12/23/2020   LVAD (left ventricular assist device) present ([place]) 15/40/0867   Complication involving left ventricular assist device (LVAD) 12/23/2020   Hypothyroidism 12/23/2020   History of gastrointestinal bleeding 10/18/2020   Left ventricular assist device (LVAD)  complication, initial encounter 09/23/2020   Infection following procedure 09/03/2020   Chronic anticoagulation 08/03/2020   S/P MVR (mitral valve replacement) 07/16/2020   PHT (pulmonary hypertension) ([place]) 07/07/2020   Snoring 07/05/2020   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 ([place]) 07/05/2020   Ischemic cardiomyopathy 01/02/2020   CKD (chronic kidney disease), stage IIIa 12/31/2019   Pulmonary edema cardiac cause ([place]) 12/25/2019   Hyperlipidemia    Gouty arthritis of both feet 11/20/2019   Mitral stenosis with insufficiency    Hypertensive urgency    Depression    Hyperglycemia 09/22/2019   Acute respiratory failure with hypoxia ([place]) 09/22/2019   Presence of permanent cardiac pacemaker    Acute blood loss anemia    Chronic kidney disease    Hypertension    CHF (congestive heart failure) ([place]) 07/16/2019   Insomnia 05/07/2019   Suspected COVID-19 virus infection 03/23/2019   AVM (arteriovenous malformation) of small bowel, acquired    Anemia, iron deficiency 11/10/2018  Rectal polyp    Benign neoplasm of cecum    Barrett's esophagus without dysplasia    Chronic diastolic heart failure ([place]) 07/03/2018   HTN (hypertension) 07/03/2018   COPD with emphysema ([place]) 06/28/2018   B12 deficiency anemia 06/28/2018   Personal history of colon cancer    Polyp of sigmoid colon    Diverticulosis of large intestine without diverticulitis    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   Renal artery stenosis ([place]) 10/03/2016   Encounter for assessment of implantable cardioverter-defibrillator (ICD) 05/06/2015   Failure of implantable cardioverter-defibrillator (ICD) lead 02/09/2015   Hospital discharge follow-up 02/09/2015   Cardiac resynchronization therapy defibrillator (CRT-D) in place 01/09/2015   Peripheral artery disease ([place]) 01/05/2015   Tobacco abuse 10/11/2014   S/P CABG x 3 07/03/2014   MI (myocardial infarction) ([place]) 07/03/2014   Atherosclerosis of  native artery of extremity with intermittent claudication ([place]) 06/18/2013   Preoperative evaluation to rule out surgical contraindication 06/18/2013   CAD (coronary artery disease) 06/01/2013   GERD (gastroesophageal reflux disease) 06/01/2013   Hypercholesterolemia 06/01/2013   Tubular adenoma of colon 06/01/2013   Major depressive disorder in remission ([place]) 06/01/2013    Conditions to be addressed/monitored:CHF, COPD, and Post op  Care Plan : Heart Failure/COPD  Updates made by Leona Singleton, RN since 07/29/2021 12:00 AM     Problem: COPD and Heart Failure complications causing hospitalization   Priority: High     Long-Range Goal: Patient will report no COPD or heart failure exacerbations causing hospitalizations within the next 90 days.   Start Date: 01/03/2021  Expected End Date: 01/03/2022  This Visit's Progress: On track  Recent Progress: On track  Priority: High  Note:   Current Barriers:  Knowledge deficit related to self care management of CHF and COPD as evidenced by recent hospitalization related to fluid retention and pneumonia.  Patient recently discharged after long hospitalization for forearm thrombectomy, embolic stroke with residual eye deficits and left sided weakness, and infection.  Has attended multiple follow up appointments.  Participating with home therapies.  States she is weak but getting better.  Does report back pain, which is getting slightly better (at least get to bathroom).  Weight this morning unable to take due to needing new batteries (151-154 baseline).  Denies any shortness of breath or lower extremity edema.  Still not able to obtain blood pressure reading, due to difficulities with dopplering on her own, even with the assistance of her fiance'.  SBP 120 when taken by therapist.  Denies any LVAD alerts.  Reports compliance with [employer].    Case Manager Clinical Goal(s):  patient will verbalize understanding of Heart Failure and COPD Action Plan  and when to call doctor patient will take all mediations as prescribed patient will weigh daily and record (notifying MD of 3 lb weight gain over night or 5 lb in a week) Patient will report no hospitalization within the next 90 days Interventions:  Collaboration with [name], MD regarding development and update of comprehensive plan of care as evidenced by provider attestation and co-signature Inter-disciplinary care team collaboration (see longitudinal plan of care) Discussed with patient when to weigh each morning after emptying bladder and placing battery in LVAD per instructions Discussed importance of daily weight and advised patient to weigh and record daily; encouraged patient to use 2022 Calendar booklet to document weights to keep track of increase for review and provider review (instead of trying to remember from day to day) Reviewed role of diuretics  in prevention of fluid overload and management of heart failure, encouraged patient to monitor symptoms and notify provider if increase in fluid retention, weight, or shortness of breath Reviewed medications and encouraged medication compliance Encouraged patient to obtain home CO2 detectors and to contact local Fire Department to verify any programs supplying families with CO2 detectors, reviewed where patient could purchase from Discussed COPD triggers including pollen and allergy season and action plan and when to use inhaler versus nebulizer Discussed use of nebulizer and rescue inhaler for shortness of breath to see if they help Confirmed patient has LVAD supplies; verified patient is changing per orders Verified patient has not had any LAVD alarms Discussed patient monitoring blood pressures daily using doppler per cardiology request and encouraged patient to seek assistance from her fiance to help her doppler pressure and record reading Instructed and encouraged patient to take doppler and blood pressure cuff to next  Cardiology appointment to have staff instruct her on correct use Encouraged patient to discuss blood pressure goal (high and low) with Cardiologist Provided patient emotional support and empathy with the recent loss of her nephew (continues to decline grief counseling) Discussed and encouraged using heat alternating with ice for back pain; discussed over the counter pain patch Encouraged to keep and attend scheduled medical appointments Reinforced with patient to contact her LVAD team for shortness of breath on day 1 or 2, not to wait until shortness of breath is too severe (day 4) for treatment options or instructions Patient Goals/Self-Care Activities Call office if I gain more than 2 pounds in one day or 5 pounds in one week Weigh myself daily and record in log along with LVAD daily parameters for provider review Monitor lower extremities and abdomen for swelling Take torsemide daily as prescribed Put new batteries in scale Call LVAD team as soon as possible with increase in shortness of breath, do not wait days later Take doppler and BP cuff to next cardiology appointment to have staff assist you in learning how to use equipment Have fiance assist with monitoring/Doppler blood pressures daily Develop a rescue plan and follow if symptoms flare up Monitor and Eliminate symptom triggers at home Obtain CO2 Detectors for home (contact local fire department to seek assistance in obtaining) Contact provider for increase use of rescue inhaler/nebulizer (use rescue inhaler and nebulizer for increase in shortness of breath) Call LVAD team for increase in shortness of breath on day 2 instead of waiting days later when it is worse Follow Up Plan: The care management team will reach out to the patient again over the next 14 business days.       Problem: Recurrent GI Bleeds leading to hospitalizations   Priority: High     Long-Range Goal: Patient will report no recurrent hospitalizations related to  GI bleed in the next 90 days   Start Date: 01/03/2021  Expected End Date: 01/03/2022  This Visit's Progress: Not on track  Recent Progress: Not on track  Priority: High  Note:   Current Barriers:  Knowledge deficits related to self health management of anemia or bleeding as evidenced by recent recurrent hospitalizations.  Home INR machine is not working and patient has been unable to check weekly and reporting to Cardiology team.  Encouraged to have home health nurse to draw lab work if unable to fix machine in next few days.  Patient recently discharged after long hospitalization for forearm thrombectomy, embolic stroke with residual eye deficits and left sided weakness, and infection.  Has attended  multiple follow up appointments.  Participating with home therapies.  States she is weak but getting better.  Does report back pain, that is limiting activity.  Encouraged patient to have home therapist request provider order for full therapy to help with back pain. Nurse Case Manager Clinical Goal(s):  patient will take all medications exactly as prescribed and will call provider for medication related questions patient will not experience hospital admission within the next 90 days related to GI bleeding. Hospital Admissions in last 6 months =5 patient will attend all scheduled medical appointments: LVAD clinic/INR check; Cardiology 05/30/21 Interventions:  Collaboration with [name], MD regarding development and update of comprehensive plan of care as evidenced by provider attestation and co-signature Inter-disciplinary care team collaboration (see longitudinal plan of care) Medications reviewed and encouraged medication compliance Encouraged to continue to increase activity as tolerated; and to work with home health therapy Encouraged to elevate left arm as much as possible while resting Reviewed signs and symptoms of infection and encouraged patient to monitor incisions Encouraged sitting  before standing and using an assistive device; reviewed and reinforced fall precautions and preventions, encouraged patient to dangle before standing and to sit back down if feeling dizziness Encouraged dietary changes to increase dietary intake of iron, Vitamin X48 and folic acid as advised/prescribed Provided education about signs and symptoms of active bleeding and advised when to call provider or 911 Signs/symptoms of bleeding reviewed (weakness, exhaustion, paleness, sleepiness, blood in stool, dark tarry stools) Encouraged to contact provider/cardiology with next INR check weekly Patient Goals/Self-Care Activities: Continue to monitor yourself for low hemoglobin (feeling tired, exhausted, pale, shortness of breath)--SEEK MEDICAL ATTENTION RIGHT AWAY Pay attention to LVAD alerts Monitor stools for blood Obtain home INR and call to Cardiology office for medication adjustment weekly as ordered; request HHRN draw INR if you are unable to obtain Monitor incisions for s/s of infection Attend scheduled medical appointments Work with home health therapy  Follow Up Plan: The care management team will reach out to the patient again over the next 14 business days.       Plan:The care management team will reach out to the patient again over the next 14 business days.  Hubert Azure RN, MSN RN Care Management Coordinator [place] [phone] [email or web address]

## 2021-07-29 NOTE — Patient Instructions (Signed)
Visit Information  PATIENT GOALS:  Goals Addressed             This Visit's Progress    (RNCM) Track and Manage Fluids and Swelling   On track    Timeframe:  Long-Range Goal Priority:  High Start Date:  01/03/21                           Expected End Date:   10/05/21                    Follow Up Date  08/05/2021    Call office if I gain more than 2 pounds in one day or 5 pounds in one week Weigh myself daily and record in log along with LVAD daily parameters for provider review Monitor lower extremities and abdomen for swelling Take torsemide daily as prescribed Call LVAD team as soon as possible with increase in shortness of breath, do not wait days later Take doppler and BP cuff to next cardiology appointment to have staff assist you in learning how to use equipment Have fiance assist with monitoring/Doppler blood pressures daily  Why is this important?   It is important to check your weight daily and watch how much salt and liquids you have.  It will help you to manage your heart failure.    Notes:      (RNCM) Track and Manage My Symptoms-COPD   On track    Timeframe:  Long-Range Goal Priority:  High Start Date:  01/03/21                           Expected End Date:   01/03/22                    Follow Up Date 08/05/21    Develop a rescue plan and follow if symptoms flare up Monitor and Eliminate symptom triggers at home Obtain CO2 Detectors for home (contact local fire department to seek assistance in obtaining) Contact provider for increase use of rescue inhaler/nebulizer (use rescue inhaler and nebulizer for increase in shortness of breath) Call LVAD team for increase in shortness of breath on day 2 instead of waiting days later when it is worse   Why is this important?   Tracking your symptoms and other information about your health helps your doctor plan your care.  Write down the symptoms, the time of day, what you were doing and what medicine you are taking.  You  will soon learn how to manage your symptoms.     Notes:      (RNCM) Track My Symptoms-GI Bleed   On track    Timeframe:  Long-Range Goal Priority:  High Start Date: 01/03/21                            Expected End Date: 02/03/22                      Follow Up Date 08/05/21   Continue to monitor yourself for low hemoglobin (feeling tired, exhausted, pale, shortness of breath)--SEEK MEDICAL ATTENTION RIGHT AWAY Pay attention to LVAD alerts Monitor stools for blood Obtain home INR and call to Cardiology office for medication adjustment weekly as ordered; request HHRN draw INR if you are unable to obtain Monitor incisions for s/s of infection Attend scheduled medical  appointments Work with home health therapy    Why is this important?   Keeping track of symptoms that you have helps you to understand your condition. You will also learn what works to manage it.  You and your doctor will then be able to come up with the best treatment plan for you.    Notes:         Patient verbalizes understanding of instructions provided today and agrees to view in [place].   The care management team will reach out to the patient again over the next 14 business days.   Hubert Azure RN, MSN RN Care Management Coordinator [place] [phone] [email or web address]

## 2021-08-01 DIAGNOSIS — T8149XD Infection following a procedure, other surgical site, subsequent encounter: Secondary | ICD-10-CM | POA: Diagnosis not present

## 2021-08-01 DIAGNOSIS — I5023 Acute on chronic systolic (congestive) heart failure: Secondary | ICD-10-CM | POA: Diagnosis not present

## 2021-08-01 DIAGNOSIS — L89151 Pressure ulcer of sacral region, stage 1: Secondary | ICD-10-CM | POA: Diagnosis not present

## 2021-08-01 DIAGNOSIS — I130 Hypertensive heart and chronic kidney disease with heart failure and stage 1 through stage 4 chronic kidney disease, or unspecified chronic kidney disease: Secondary | ICD-10-CM | POA: Diagnosis not present

## 2021-08-01 DIAGNOSIS — E1122 Type 2 diabetes mellitus with diabetic chronic kidney disease: Secondary | ICD-10-CM | POA: Diagnosis not present

## 2021-08-04 DIAGNOSIS — I5023 Acute on chronic systolic (congestive) heart failure: Secondary | ICD-10-CM | POA: Diagnosis not present

## 2021-08-04 DIAGNOSIS — T8149XD Infection following a procedure, other surgical site, subsequent encounter: Secondary | ICD-10-CM | POA: Diagnosis not present

## 2021-08-04 DIAGNOSIS — L89151 Pressure ulcer of sacral region, stage 1: Secondary | ICD-10-CM | POA: Diagnosis not present

## 2021-08-04 DIAGNOSIS — E1122 Type 2 diabetes mellitus with diabetic chronic kidney disease: Secondary | ICD-10-CM | POA: Diagnosis not present

## 2021-08-04 DIAGNOSIS — I130 Hypertensive heart and chronic kidney disease with heart failure and stage 1 through stage 4 chronic kidney disease, or unspecified chronic kidney disease: Secondary | ICD-10-CM | POA: Diagnosis not present

## 2021-08-05 ENCOUNTER — Ambulatory Visit: Payer: Medicare Other | Admitting: *Deleted

## 2021-08-05 DIAGNOSIS — I5023 Acute on chronic systolic (congestive) heart failure: Secondary | ICD-10-CM | POA: Diagnosis not present

## 2021-08-05 DIAGNOSIS — J439 Emphysema, unspecified: Secondary | ICD-10-CM | POA: Diagnosis not present

## 2021-08-05 DIAGNOSIS — D62 Acute posthemorrhagic anemia: Secondary | ICD-10-CM

## 2021-08-05 DIAGNOSIS — I5032 Chronic diastolic (congestive) heart failure: Secondary | ICD-10-CM

## 2021-08-05 DIAGNOSIS — I10 Essential (primary) hypertension: Secondary | ICD-10-CM

## 2021-08-05 DIAGNOSIS — J441 Chronic obstructive pulmonary disease with (acute) exacerbation: Secondary | ICD-10-CM

## 2021-08-05 DIAGNOSIS — K5520 Angiodysplasia of colon without hemorrhage: Secondary | ICD-10-CM

## 2021-08-05 NOTE — Chronic Care Management (AMB) (Signed)
Chronic Care Management   CCM RN Visit Note  08/05/2021 Name: Ashley [name] MRN: [id number] DOB: [birth year]  Subjective: Ashley [name] is a 70 y.o. year old female who is a primary care patient of [name], MD. The care management team was consulted for assistance with disease management and care coordination needs.    Engaged with patient by telephone for follow up visit in response to provider referral for case management and/or care coordination services.   Consent to Services:  The patient was given information about Chronic Care Management services, agreed to services, and gave verbal consent prior to initiation of services.  Please see initial visit note for detailed documentation.   Patient agreed to services and verbal consent obtained.   Assessment: Review of patient past medical history, allergies, medications, health status, including review of consultants reports, laboratory and other test data, was performed as part of comprehensive evaluation and provision of chronic care management services.   SDOH (Social Determinants of Health) assessments and interventions performed:    CCM Care Plan  Allergies  Allergen Reactions   Oxycodone     Other reaction(s): Hallucination Hallucinations to oxycodone noted while inpatient 06/2021. Tolerates hydromorphone.    Vancomycin Nausea And Vomiting and Palpitations   Hydrocodone-Acetaminophen Nausea Only    Outpatient Encounter Medications as of 08/05/2021  Medication Sig Note   acetaminophen (TYLENOL) 325 MG tablet Take 650 mg by mouth every 6 (six) hours as needed.    albuterol (PROAIR HFA) 108 (90 Base) MCG/ACT inhaler Inhale 1-2 puffs into the lungs every 6 (six) hours as needed for wheezing or shortness of breath.    allopurinol (ZYLOPRIM) 100 MG tablet TAKE ONE TABLET BY MOUTH ONCE DAILY    ALPRAZolam (XANAX) 0.5 MG tablet TAKE 1/2 TO 1 TABLET BY MOUTH AT BEDTIME AS NEEDED FOR ANXIETY    amitriptyline (ELAVIL) 25  MG tablet Take 1 tablet (25 mg total) by mouth at bedtime.    amLODipine (NORVASC) 2.5 MG tablet Take 2.5 mg by mouth daily.    atorvastatin (LIPITOR) 40 MG tablet TAKE ONE TABLET BY MOUTH ONCE DAILY    carvedilol (COREG) 25 MG tablet Take 25 mg by mouth 2 (two) times daily.    colchicine 0.6 MG tablet  01/03/2021: Reports taking only when gout flares up   cyanocobalamin (,VITAMIN B-12,) 1000 MCG/ML injection Inject 1 mL into the muscle monthly (Patient taking differently: Inject 1,000 mcg into the muscle every 30 (thirty) days.)    escitalopram (LEXAPRO) 10 MG tablet TAKE ONE TABLET BY MOUTH ONCE DAILY    ferrous sulfate 325 (65 FE) MG tablet Take 325 mg by mouth daily with breakfast.    fluticasone (FLONASE) 50 MCG/ACT nasal spray Place 1 spray into both nostrils as needed for allergies or rhinitis.    folic acid (FOLVITE) 1 MG tablet TAKE ONE TABLET BY MOUTH ONCE DAILY    GNP MUCUS ER 600 MG 12 hr tablet Take 600 mg by mouth 2 (two) times daily. (Patient not taking: Reported on 07/21/2021) 01/03/2021: Reports taking as needed   HYDROmorphone (DILAUDID) 2 MG tablet Take 2 mg by mouth every 4 (four) hours as needed.    ipratropium-albuterol (DUONEB) 0.5-2.5 (3) MG/3ML SOLN USE 1 VIAL VIA NEBULIZER AND INHALE BY MOUTH INTO THE LUNGS EVERY 6 HOURS AS NEEDED    levothyroxine (SYNTHROID) 75 MCG tablet Take 1 tablet (75 mcg total) by mouth daily.    magnesium oxide (MAG-OX) 400 (241.3 Mg) MG tablet Take 1  tablet by mouth every Monday, Wednesday, and Friday.    mupirocin ointment (BACTROBAN) 2 % Apply topically daily.    omeprazole (PRILOSEC) 40 MG capsule TAKE ONE CAPSULE BY MOUTH ONCE DAILY (Patient taking differently: Take 40 mg by mouth in the morning and at bedtime.) 01/03/2021: Reports taking daily   potassium chloride (KLOR-CON) 10 MEQ tablet Take 10 mEq by mouth every Monday, Wednesday, and Friday. Take 2 tablets every Monday Wednesday Friday (Patient not taking: Reported on 07/21/2021)     sacubitril-valsartan (ENTRESTO) 97-103 MG Take 1 tablet by mouth 2 (two) times daily. (Patient not taking: Reported on 07/21/2021)    senna-docusate (SENOKOT-S) 8.6-50 MG tablet Take by mouth.    spironolactone (ALDACTONE) 25 MG tablet Take 25 mg by mouth daily.    tiZANidine (ZANAFLEX) 2 MG tablet Take 1 tablet (2 mg total) by mouth every 6 (six) hours as needed for muscle spasms.    torsemide (DEMADEX) 20 MG tablet Take 20 mg by mouth daily.    traZODone (DESYREL) 50 MG tablet SMARTSIG:0.5-1 Tablet(s) By Mouth Every Evening    TRELEGY ELLIPTA 100-62.5-25 MCG/INH AEPB INHALE 1 PUFF BY MOUTH INTO THE LUNGS EVERY DAY    warfarin (COUMADIN) 2 MG tablet Take 4 mg by mouth at bedtime. Monday, Wednesday and Friday 3 mg and AOD takes 4 mg.    No facility-administered encounter medications on file as of 08/05/2021.    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   History of embolectomy 06/02/2021   History of fasciotomy 06/02/2021   Left arm pain 06/02/2021   Malignant neoplasm of colon, unspecified part of colon ([place]) 05/06/2021   Neuropathy 05/06/2021   PAF (paroxysmal atrial fibrillation) ([place]) 02/10/2021   Palpitation 02/10/2021   Red blood cell antibody positive 12/27/2020   Sternal wound infection 12/23/2020   Anticoagulation goal of INR 2 to 3 12/23/2020   LVAD (left ventricular assist device) present ([place]) 24/58/0998   Complication involving left ventricular assist device (LVAD) 12/23/2020   Hypothyroidism 12/23/2020   History of gastrointestinal bleeding 10/18/2020   Left ventricular assist device (LVAD) complication, initial encounter 09/23/2020   Infection following procedure 09/03/2020   Chronic anticoagulation 08/03/2020   S/P MVR (mitral  valve replacement) 07/16/2020   PHT (pulmonary hypertension) ([place]) 07/07/2020   Snoring 07/05/2020   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 ([place]) 07/05/2020   Ischemic cardiomyopathy 01/02/2020   CKD (chronic kidney disease), stage IIIa 12/31/2019   Pulmonary edema cardiac cause ([place]) 12/25/2019   Hyperlipidemia    Gouty arthritis of both feet 11/20/2019   Mitral stenosis with insufficiency    Hypertensive urgency    Depression    Hyperglycemia 09/22/2019   Acute respiratory failure with hypoxia ([place]) 09/22/2019   Presence of permanent cardiac pacemaker    Acute blood loss anemia    Chronic kidney disease    Hypertension    CHF (congestive heart failure) ([place]) 07/16/2019   Insomnia 05/07/2019   Suspected COVID-19 virus infection 03/23/2019   AVM (arteriovenous malformation) of small bowel, acquired    Anemia, iron deficiency 11/10/2018   Rectal polyp    Benign neoplasm of cecum    Barrett's esophagus without dysplasia    Chronic diastolic heart failure ([place]) 07/03/2018  HTN (hypertension) 07/03/2018   COPD with emphysema ([place]) 06/28/2018   B12 deficiency anemia 06/28/2018   Personal history of colon cancer    Polyp of sigmoid colon    Diverticulosis of large intestine without diverticulitis    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   Renal artery stenosis ([place]) 10/03/2016   Encounter for assessment of implantable cardioverter-defibrillator (ICD) 05/06/2015   Failure of implantable cardioverter-defibrillator (ICD) lead 02/09/2015   Hospital discharge follow-up 02/09/2015   Cardiac resynchronization therapy defibrillator (CRT-D) in place 01/09/2015   Peripheral artery disease ([place]) 01/05/2015   Tobacco abuse 10/11/2014   S/P CABG x 3 07/03/2014   MI (myocardial infarction) ([place]) 07/03/2014   Atherosclerosis of native artery of extremity with intermittent claudication ([place]) 06/18/2013   Preoperative evaluation to rule out surgical contraindication  06/18/2013   CAD (coronary artery disease) 06/01/2013   GERD (gastroesophageal reflux disease) 06/01/2013   Hypercholesterolemia 06/01/2013   Tubular adenoma of colon 06/01/2013   Major depressive disorder in remission ([place]) 06/01/2013    Conditions to be addressed/monitored:CHF and COPD  Care Plan : Heart Failure/COPD  Updates made by Leona Singleton, RN since 08/05/2021 12:00 AM     Problem: COPD and Heart Failure complications causing hospitalization   Priority: High     Long-Range Goal: Patient will report no COPD or heart failure exacerbations causing hospitalizations within the next 90 days.   Start Date: 01/03/2021  Expected End Date: 01/03/2022  This Visit's Progress: On track  Recent Progress: On track  Priority: High  Note:   Current Barriers:  Knowledge deficit related to self care management of CHF and COPD as evidenced by recent hospitalization related to fluid retention and pneumonia.  Patient recently discharged after long hospitalization for forearm thrombectomy, embolic stroke with residual eye deficits and left sided weakness, and infection.  Has attended multiple follow up appointments.  Participating with home therapies.  States she is weak but getting better.  Does report back pain, which is getting slightly better (at least get to bathroom).  Weight this morning was 147 pounds (151-154 baseline).  Denies any shortness of breath or lower extremity edema.  Still not able to obtain blood pressure reading, due to difficulities with dopplering on her own, even with the assistance of her fiance'.  SBP 120 when taken by therapist.  Denies any LVAD alerts.  Reports compliance with Demadex.  Denies need for rescue inhaler Case Manager Clinical Goal(s):  patient will verbalize understanding of Heart Failure and COPD Action Plan and when to call doctor patient will take all mediations as prescribed patient will weigh daily and record (notifying MD of 3 lb weight gain over  night or 5 lb in a week) Patient will report no hospitalization within the next 90 days Interventions:  Collaboration with [name], MD regarding development and update of comprehensive plan of care as evidenced by provider attestation and co-signature Inter-disciplinary care team collaboration (see longitudinal plan of care) Discussed with patient when to weigh each morning after emptying bladder and placing battery in LVAD per instructions Discussed importance of daily weight and advised patient to weigh and record daily; encouraged patient to use 2022 Calendar booklet to document weights to keep track of increase for review and provider review (instead of trying to remember from day to day) Reviewed role of diuretics in prevention of fluid overload and management of heart failure, encouraged patient to monitor symptoms and notify provider if increase in fluid retention, weight, or shortness of breath Reviewed medications and  encouraged medication compliance Encouraged patient to obtain home CO2 detectors and to contact local Fire Department to verify any programs supplying families with CO2 detectors, reviewed where patient could purchase from Discussed COPD triggers including pollen and allergy season and action plan and when to use inhaler versus nebulizer Discussed use of nebulizer and rescue inhaler for shortness of breath to see if they help Confirmed patient has LVAD supplies; verified patient is changing per orders Verified patient has not had any LAVD alarms Discussed patient monitoring blood pressures daily using doppler per cardiology request and encouraged patient to seek assistance from her fiance to help her doppler pressure and record reading Instructed and encouraged patient to take doppler and blood pressure cuff to next Cardiology appointment to have staff instruct her on correct use Encouraged patient to discuss blood pressure goal (high and low) with  Cardiologist Provided patient emotional support and empathy with the recent loss of her nephew (continues to decline grief counseling) Discussed and encouraged using heat alternating with ice for back pain; discussed over the counter pain patch Encouraged to keep and attend scheduled medical appointments Reinforced with patient to contact her LVAD team for shortness of breath on day 1 or 2, not to wait until shortness of breath is too severe (day 4) for treatment options or instructions Patient Goals/Self-Care Activities Call office if I gain more than 2 pounds in one day or 5 pounds in one week Weigh myself daily and record in log along with LVAD daily parameters for provider review Monitor lower extremities and abdomen for swelling Take torsemide daily as prescribed Put new batteries in scale Call LVAD team as soon as possible with increase in shortness of breath, do not wait days later Take doppler and BP cuff to next cardiology appointment to have staff assist you in learning how to use equipment Have fiance assist with monitoring/Doppler blood pressures daily Develop a rescue plan and follow if symptoms flare up Monitor and Eliminate symptom triggers at home Obtain CO2 Detectors for home (contact local fire department to seek assistance in obtaining) Contact provider for increase use of rescue inhaler/nebulizer (use rescue inhaler and nebulizer for increase in shortness of breath) Call LVAD team for increase in shortness of breath on day 2 instead of waiting days later when it is worse Follow Up Plan: The care management team will reach out to the patient again over the next 14 business days.       Problem: Recurrent GI Bleeds leading to hospitalizations   Priority: High     Long-Range Goal: Patient will report no recurrent hospitalizations related to GI bleed in the next 90 days   Start Date: 01/03/2021  Expected End Date: 01/03/2022  This Visit's Progress: Not on track  Recent  Progress: Not on track  Priority: High  Note:   Current Barriers:  Knowledge deficits related to self health management of anemia or bleeding as evidenced by recent recurrent hospitalizations.  Home INR machine is stll not working and patient has been unable to check weekly and reporting to Cardiology team.  Encouraged to have home health nurse to draw lab work if unable to fix machine in next few days.  Patient recently discharged after long hospitalization for forearm thrombectomy, embolic stroke with residual eye deficits and left sided weakness, and infection.  Has attended multiple follow up appointments.  Completed home therapies.  States she is weak but getting better. Continues to report back pain, that is limiting activity.   Nurse Case Manager  Clinical Goal(s):  patient will take all medications exactly as prescribed and will call provider for medication related questions patient will not experience hospital admission within the next 90 days related to GI bleeding. Hospital Admissions in last 6 months =5 patient will attend all scheduled medical appointments: LVAD clinic/INR check; Cardiology 05/30/21 Interventions:  Collaboration with [name], MD regarding development and update of comprehensive plan of care as evidenced by provider attestation and co-signature Inter-disciplinary care team collaboration (see longitudinal plan of care) Medications reviewed and encouraged medication compliance Encouraged to continue to increase activity as tolerated; and to work with home health therapy Encouraged to elevate left arm as much as possible while resting Reviewed signs and symptoms of infection and encouraged patient to monitor incisions Encouraged sitting before standing and using an assistive device; reviewed and reinforced fall precautions and preventions, encouraged patient to dangle before standing and to sit back down if feeling dizziness Encouraged dietary changes to increase  dietary intake of iron, Vitamin M19 and folic acid as advised/prescribed Provided education about signs and symptoms of active bleeding and advised when to call provider or 911 Signs/symptoms of bleeding reviewed (weakness, exhaustion, paleness, sleepiness, blood in stool, dark tarry stools) Encouraged to contact provider/cardiology with next INR check weekly; if you are unable to get please have [facility] Nurse draw Patient Goals/Self-Care Activities: Continue to monitor yourself for low hemoglobin (feeling tired, exhausted, pale, shortness of breath)--SEEK MEDICAL ATTENTION RIGHT AWAY Pay attention to LVAD alerts Monitor stools for blood Obtain home INR and call to Cardiology office for medication adjustment weekly as ordered; request HHRN draw INR if you are unable to obtain Monitor incisions for s/s of infection Attend scheduled medical appointments Work with home health therapy  Follow Up Plan: The care management team will reach out to the patient again over the next 14 business days.       Plan:The care management team will reach out to the patient again over the next 14 business days.  Hubert Azure RN, MSN RN Care Management Coordinator [place] [phone] [email or web address]

## 2021-08-05 NOTE — Patient Instructions (Signed)
Visit Information  PATIENT GOALS:  Goals Addressed             This Visit's Progress    (RNCM) Track and Manage Fluids and Swelling   On track    Timeframe:  Long-Range Goal Priority:  High Start Date:  01/03/21                           Expected End Date:   02/03/22                    Follow Up Date  08/12/2021    Call office if I gain more than 2 pounds in one day or 5 pounds in one week Weigh myself daily and record in log along with LVAD daily parameters for provider review Monitor lower extremities and abdomen for swelling Take torsemide daily as prescribed Call LVAD team as soon as possible with increase in shortness of breath, do not wait days later Take doppler and BP cuff to next cardiology appointment to have staff assist you in learning how to use equipment Have fiance assist with monitoring/Doppler blood pressures daily  Why is this important?   It is important to check your weight daily and watch how much salt and liquids you have.  It will help you to manage your heart failure.    Notes:      (RNCM) Track and Manage My Symptoms-COPD   On track    Timeframe:  Long-Range Goal Priority:  High Start Date:  01/03/21                           Expected End Date:   01/03/22                    Follow Up Date 08/12/21    Develop a rescue plan and follow if symptoms flare up Monitor and Eliminate symptom triggers at home Obtain CO2 Detectors for home (contact local fire department to seek assistance in obtaining) Contact provider for increase use of rescue inhaler/nebulizer (use rescue inhaler and nebulizer for increase in shortness of breath) Call LVAD team for increase in shortness of breath on day 2 instead of waiting days later when it is worse   Why is this important?   Tracking your symptoms and other information about your health helps your doctor plan your care.  Write down the symptoms, the time of day, what you were doing and what medicine you are taking.  You  will soon learn how to manage your symptoms.     Notes:      (RNCM) Track My Symptoms-GI Bleed   Not on track    Timeframe:  Long-Range Goal Priority:  High Start Date: 01/03/21                            Expected End Date: 02/03/22                      Follow Up Date 09/12/21   Continue to monitor yourself for low hemoglobin (feeling tired, exhausted, pale, shortness of breath)--SEEK MEDICAL ATTENTION RIGHT AWAY Pay attention to LVAD alerts Monitor stools for blood Obtain home INR and call to Cardiology office for medication adjustment weekly as ordered; request HHRN draw INR if you are unable to obtain Monitor incisions for s/s of infection Attend scheduled  medical appointments Work with home health therapy    Why is this important?   Keeping track of symptoms that you have helps you to understand your condition. You will also learn what works to manage it.  You and your doctor will then be able to come up with the best treatment plan for you.    Notes:         Patient verbalizes understanding of instructions provided today and agrees to view in [place].   The care management team will reach out to the patient again over the next 14 business days.   Hubert Azure RN, MSN RN Care Management Coordinator [place] [phone] [email or web address]

## 2021-08-09 DIAGNOSIS — I428 Other cardiomyopathies: Secondary | ICD-10-CM | POA: Diagnosis not present

## 2021-08-09 DIAGNOSIS — Z48812 Encounter for surgical aftercare following surgery on the circulatory system: Secondary | ICD-10-CM | POA: Diagnosis not present

## 2021-08-09 DIAGNOSIS — Z4801 Encounter for change or removal of surgical wound dressing: Secondary | ICD-10-CM | POA: Diagnosis not present

## 2021-08-09 DIAGNOSIS — Z95811 Presence of heart assist device: Secondary | ICD-10-CM | POA: Diagnosis not present

## 2021-08-11 ENCOUNTER — Other Ambulatory Visit: Payer: Self-pay | Admitting: Internal Medicine

## 2021-08-12 ENCOUNTER — Telehealth: Payer: Medicare Other

## 2021-08-12 ENCOUNTER — Other Ambulatory Visit: Payer: Self-pay | Admitting: Internal Medicine

## 2021-08-13 DIAGNOSIS — I130 Hypertensive heart and chronic kidney disease with heart failure and stage 1 through stage 4 chronic kidney disease, or unspecified chronic kidney disease: Secondary | ICD-10-CM | POA: Diagnosis not present

## 2021-08-13 DIAGNOSIS — E1122 Type 2 diabetes mellitus with diabetic chronic kidney disease: Secondary | ICD-10-CM | POA: Diagnosis not present

## 2021-08-13 DIAGNOSIS — I5023 Acute on chronic systolic (congestive) heart failure: Secondary | ICD-10-CM | POA: Diagnosis not present

## 2021-08-13 DIAGNOSIS — T8149XD Infection following a procedure, other surgical site, subsequent encounter: Secondary | ICD-10-CM | POA: Diagnosis not present

## 2021-08-13 DIAGNOSIS — L89151 Pressure ulcer of sacral region, stage 1: Secondary | ICD-10-CM | POA: Diagnosis not present

## 2021-08-19 ENCOUNTER — Telehealth: Payer: Self-pay | Admitting: Internal Medicine

## 2021-08-19 NOTE — Telephone Encounter (Signed)
Rejection Reason - Patient Declined - patient cancelled appointment said "she is feeling better now"" Alfred Levins said on Aug 18, 2021 5:08 PM  Msg from [facility] cardio

## 2021-08-22 ENCOUNTER — Ambulatory Visit (INDEPENDENT_AMBULATORY_CARE_PROVIDER_SITE_OTHER): Payer: Medicare Other

## 2021-08-22 DIAGNOSIS — G8929 Other chronic pain: Secondary | ICD-10-CM

## 2021-08-22 DIAGNOSIS — Z95811 Presence of heart assist device: Secondary | ICD-10-CM

## 2021-08-22 DIAGNOSIS — I5032 Chronic diastolic (congestive) heart failure: Secondary | ICD-10-CM

## 2021-08-22 NOTE — Chronic Care Management (AMB) (Signed)
Chronic Care Management   CCM RN Visit Note  08/22/2021 Name: Ashley [name] MRN: [id number] DOB: 06-Feb-1951  Subjective: Ashley [name] is a 70 y.o. year old female who is a primary care patient of [name], MD. The care management team was consulted for assistance with disease management and care coordination needs.    Engaged with patient by telephone for follow up visit in response to provider referral for case management and/or care coordination services.   Consent to Services:  The patient was given information about Chronic Care Management services, agreed to services, and gave verbal consent prior to initiation of services.  Please see initial visit note for detailed documentation.   Patient agreed to services and verbal consent obtained.   Assessment: Review of patient past medical history, allergies, medications, health status, including review of consultants reports, laboratory and other test data, was performed as part of comprehensive evaluation and provision of chronic care management services.   SDOH (Social Determinants of Health) assessments and interventions performed:    CCM Care Plan  Allergies  Allergen Reactions   Oxycodone     Other reaction(s): Hallucination Hallucinations to oxycodone noted while inpatient 06/2021. Tolerates hydromorphone.    Vancomycin Nausea And Vomiting and Palpitations   Hydrocodone-Acetaminophen Nausea Only    Outpatient Encounter Medications as of 08/22/2021  Medication Sig Note   acetaminophen (TYLENOL) 325 MG tablet Take 650 mg by mouth every 6 (six) hours as needed.    albuterol (PROAIR HFA) 108 (90 Base) MCG/ACT inhaler Inhale 1-2 puffs into the lungs every 6 (six) hours as needed for wheezing or shortness of breath.    allopurinol (ZYLOPRIM) 100 MG tablet TAKE ONE TABLET BY MOUTH ONCE DAILY    ALPRAZolam (XANAX) 0.5 MG tablet TAKE 1/2 TO 1 TABLET BY MOUTH AT BEDTIME AS NEEDED FOR ANXIETY    amitriptyline (ELAVIL)  25 MG tablet Take 1 tablet (25 mg total) by mouth at bedtime.    amLODipine (NORVASC) 2.5 MG tablet Take 2.5 mg by mouth daily.    atorvastatin (LIPITOR) 40 MG tablet TAKE ONE TABLET BY MOUTH ONCE DAILY    carvedilol (COREG) 25 MG tablet Take 25 mg by mouth 2 (two) times daily.    colchicine 0.6 MG tablet  01/03/2021: Reports taking only when gout flares up   cyanocobalamin (,VITAMIN B-12,) 1000 MCG/ML injection Inject 1 mL into the muscle monthly (Patient taking differently: Inject 1,000 mcg into the muscle every 30 (thirty) days.)    escitalopram (LEXAPRO) 10 MG tablet TAKE ONE TABLET BY MOUTH ONCE DAILY    ferrous sulfate 325 (65 FE) MG tablet Take 325 mg by mouth daily with breakfast.    fluticasone (FLONASE) 50 MCG/ACT nasal spray Place 1 spray into both nostrils as needed for allergies or rhinitis.    folic acid (FOLVITE) 1 MG tablet TAKE ONE TABLET BY MOUTH ONCE DAILY    GNP MUCUS ER 600 MG 12 hr tablet Take 600 mg by mouth 2 (two) times daily. (Patient not taking: Reported on 07/21/2021) 01/03/2021: Reports taking as needed   HYDROmorphone (DILAUDID) 2 MG tablet Take 2 mg by mouth every 4 (four) hours as needed.    ipratropium-albuterol (DUONEB) 0.5-2.5 (3) MG/3ML SOLN USE 1 VIAL VIA NEBULIZER AND INHALE BY MOUTH INTO THE LUNGS EVERY 6 HOURS AS NEEDED    levothyroxine (SYNTHROID) 75 MCG tablet Take 1 tablet (75 mcg total) by mouth daily.    magnesium oxide (MAG-OX) 400 (241.3 Mg) MG tablet Take 1  tablet by mouth every Monday, Wednesday, and Friday.    mupirocin ointment (BACTROBAN) 2 % Apply topically daily.    omeprazole (PRILOSEC) 40 MG capsule TAKE ONE CAPSULE BY MOUTH ONCE DAILY (Patient taking differently: Take 40 mg by mouth in the morning and at bedtime.) 01/03/2021: Reports taking daily   potassium chloride (KLOR-CON) 10 MEQ tablet Take 10 mEq by mouth every Monday, Wednesday, and Friday. Take 2 tablets every Monday Wednesday Friday (Patient not taking: Reported on 07/21/2021)     sacubitril-valsartan (ENTRESTO) 97-103 MG Take 1 tablet by mouth 2 (two) times daily. (Patient not taking: Reported on 07/21/2021)    senna-docusate (SENOKOT-S) 8.6-50 MG tablet Take by mouth.    spironolactone (ALDACTONE) 25 MG tablet Take 25 mg by mouth daily.    tiZANidine (ZANAFLEX) 2 MG tablet TAKE ONE TABLET BY MOUTH EVERY 6 HOURS AS NEEDED FOR MUSCLE SPASM    torsemide (DEMADEX) 20 MG tablet Take 20 mg by mouth daily.    traZODone (DESYREL) 50 MG tablet SMARTSIG:0.5-1 Tablet(s) By Mouth Every Evening    TRELEGY ELLIPTA 100-62.5-25 MCG/INH AEPB INHALE 1 PUFF BY MOUTH INTO THE LUNGS EVERY DAY    warfarin (COUMADIN) 2 MG tablet Take 4 mg by mouth at bedtime. Monday, Wednesday and Friday 3 mg and AOD takes 4 mg.    No facility-administered encounter medications on file as of 08/22/2021.    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   History of embolectomy 06/02/2021   History of fasciotomy 06/02/2021   Left arm pain 06/02/2021   Malignant neoplasm of colon, unspecified part of colon ([place]) 05/06/2021   Neuropathy 05/06/2021   PAF (paroxysmal atrial fibrillation) ([place]) 02/10/2021   Palpitation 02/10/2021   Red blood cell antibody positive 12/27/2020   Sternal wound infection 12/23/2020   Anticoagulation goal of INR 2 to 3 12/23/2020   LVAD (left ventricular assist device) present ([place]) 44/96/7591   Complication involving left ventricular assist device (LVAD) 12/23/2020   Hypothyroidism 12/23/2020   History of gastrointestinal bleeding 10/18/2020   Left ventricular assist device (LVAD) complication, initial encounter 09/23/2020   Infection following procedure 09/03/2020   Chronic anticoagulation 08/03/2020   S/P MVR (mitral valve replacement)  07/16/2020   PHT (pulmonary hypertension) ([place]) 07/07/2020   Snoring 07/05/2020   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 ([place]) 07/05/2020   Ischemic cardiomyopathy 01/02/2020   CKD (chronic kidney disease), stage IIIa 12/31/2019   Pulmonary edema cardiac cause ([place]) 12/25/2019   Hyperlipidemia    Gouty arthritis of both feet 11/20/2019   Mitral stenosis with insufficiency    Hypertensive urgency    Depression    Hyperglycemia 09/22/2019   Acute respiratory failure with hypoxia ([place]) 09/22/2019   Presence of permanent cardiac pacemaker    Acute blood loss anemia    Chronic kidney disease    Hypertension    CHF (congestive heart failure) ([place]) 07/16/2019   Insomnia 05/07/2019   Suspected COVID-19 virus infection 03/23/2019   AVM (arteriovenous malformation) of small bowel, acquired    Anemia, iron deficiency 11/10/2018   Rectal polyp    Benign neoplasm of cecum    Barrett's esophagus without dysplasia    Chronic diastolic heart failure ([place]) 07/03/2018   HTN (hypertension)  07/03/2018   COPD with emphysema ([place]) 06/28/2018   B12 deficiency anemia 06/28/2018   Personal history of colon cancer    Polyp of sigmoid colon    Diverticulosis of large intestine without diverticulitis    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   Renal artery stenosis ([place]) 10/03/2016   Encounter for assessment of implantable cardioverter-defibrillator (ICD) 05/06/2015   Failure of implantable cardioverter-defibrillator (ICD) lead 02/09/2015   Hospital discharge follow-up 02/09/2015   Cardiac resynchronization therapy defibrillator (CRT-D) in place 01/09/2015   Peripheral artery disease ([place]) 01/05/2015   Tobacco abuse 10/11/2014   S/P CABG x 3 07/03/2014   MI (myocardial infarction) ([place]) 07/03/2014   Atherosclerosis of native artery of extremity with intermittent claudication ([place]) 06/18/2013   Preoperative evaluation to rule out surgical contraindication 06/18/2013   CAD  (coronary artery disease) 06/01/2013   GERD (gastroesophageal reflux disease) 06/01/2013   Hypercholesterolemia 06/01/2013   Tubular adenoma of colon 06/01/2013   Major depressive disorder in remission ([place]) 06/01/2013    Conditions to be addressed/monitored:CHF and COPD  Care Plan : Heart Failure/COPD  Updates made by Johnney Killian, RN since 08/22/2021 12:00 AM     Problem: COPD and Heart Failure complications causing hospitalization   Priority: High     Long-Range Goal: Patient will report no COPD or heart failure exacerbations causing hospitalizations within the next 90 days.   Start Date: 01/03/2021  Expected End Date: 01/03/2022  Recent Progress: On track  Priority: High  Note:   Current Barriers:  Knowledge deficit related to self care management of CHF and COPD as evidenced by recent hospitalization related to fluid retention and pneumonia.  Successful outreach to patient this afternoon.  She reports her weight has been stable, it was 146 yesterday and has not fluctuated much and is stable.   Case Manager Clinical Goal(s):  patient will verbalize understanding of Heart Failure and COPD Action Plan and when to call doctor patient will take all mediations as prescribed patient will weigh daily and record (notifying MD of 3 lb weight gain over night or 5 lb in a week) Patient will report no hospitalization within the next 90 days Interventions:  Collaboration with [name], MD regarding development and update of comprehensive plan of care as evidenced by provider attestation and co-signature Inter-disciplinary care team collaboration (see longitudinal plan of care) Discussed with patient when to weigh each morning after emptying bladder and placing battery in LVAD per instructions Discussed importance of daily weight and advised patient to weigh and record daily; encouraged patient to use 2022 Calendar booklet to document weights to keep track of increase for review and  provider review (instead of trying to remember from day to day) Reviewed role of diuretics in prevention of fluid overload and management of heart failure, encouraged patient to monitor symptoms and notify provider if increase in fluid retention, weight, or shortness of breath Reviewed medications and encouraged medication compliance Encouraged patient to obtain home CO2 detectors and to contact local Fire Department to verify any programs supplying families with CO2 detectors, reviewed where patient could purchase from Discussed COPD triggers including pollen and allergy season and action plan and when to use inhaler versus nebulizer Discussed use of nebulizer and rescue inhaler for shortness of breath to see if they help Confirmed patient has LVAD supplies; verified patient is changing per orders Verified patient has not had any LAVD alarms Discussed patient monitoring blood pressures daily using doppler per cardiology request and encouraged patient to seek assistance from her  fiance to help her doppler pressure and record reading Instructed and encouraged patient to take doppler and blood pressure cuff to next Cardiology appointment to have staff instruct her on correct use- Patient has appointment Friday in   [place] with cardiologist. Encouraged patient to discuss blood pressure goal (high and low) with Cardiologist Provided patient emotional support and empathy with the recent loss of her nephew (continues to decline grief counseling) Discussed patients continued back pain she has had over the past month.  She noted that her pain is not going away and the Zanaflex 2mg  does not help her.  Patient questioned that she might need an xray or something more for her pain.  Message sent to Dr. Derrel Nip to inquire if she should be seen in the office or if she may need an xray.   Encouraged to keep and attend scheduled medical appointments Reinforced with patient to contact her LVAD team for shortness of  breath on day 1 or 2, not to wait until shortness of breath is too severe (day 4) for treatment options or instructions Patient Goals/Self-Care Activities Call office if I gain more than 2 pounds in one day or 5 pounds in one week Weigh myself daily and record in log along with LVAD daily parameters for provider review Monitor lower extremities and abdomen for swelling Call LVAD team as soon as possible with increase in shortness of breath, do not wait days later Take doppler and BP cuff to next cardiology appointment to have staff assist you in learning how to use equipment Have fiance assist with monitoring/Doppler blood pressures daily Develop a rescue plan and follow if symptoms flare up Monitor and Eliminate symptom triggers at home [place] for home (contact local fire department to seek assistance in obtaining) Contact provider for increase use of rescue inhaler/nebulizer (use rescue inhaler and nebulizer for increase in shortness of breath) Call LVAD team for increase in shortness of breath on day 2 instead of waiting days later when it is worse Follow Up Plan: The care management team will reach out to the patient again over the next 14 business days.       Problem: Recurrent GI Bleeds leading to hospitalizations   Priority: High     Long-Range Goal: Patient will report no recurrent hospitalizations related to GI bleed in the next 90 days   Start Date: 01/03/2021  Expected End Date: 01/03/2022  Recent Progress: Not on track  Priority: High  Note:   Current Barriers:  Knowledge deficits related to self health management of anemia or bleeding as evidenced by recent recurrent hospitalizations.   Nurse Case Manager Clinical Goal(s):  patient will take all medications exactly as prescribed and will call provider for medication related questions patient will not experience hospital admission within the next 90 days related to GI bleeding. Hospital Admissions in last 6  months =5 patient will attend all scheduled medical appointments: Patient has appointment in [facility] on Friday for follow up. Interventions:  Collaboration with [name], MD regarding development and update of comprehensive plan of care as evidenced by provider attestation and co-signature Inter-disciplinary care team collaboration (see longitudinal plan of care) Medications reviewed and encouraged medication compliance Encouraged to continue to increase activity as tolerated; and to work with home health therapy Encouraged to elevate left arm as much as possible while resting Encouraged sitting before standing and using an assistive device; reviewed and reinforced fall precautions and preventions, encouraged patient to dangle before standing and to sit  back down if feeling dizziness Encouraged dietary changes to increase dietary intake of iron, Vitamin Y56 and folic acid as advised/prescribed Provided education about signs and symptoms of active bleeding and advised when to call provider or 911 Signs/symptoms of bleeding reviewed (weakness, exhaustion, paleness, sleepiness, blood in stool, dark tarry stools) Encouraged to contact provider/cardiology with next INR check weekly Patient Goals/Self-Care Activities: Continue to monitor yourself for low hemoglobin (feeling tired, exhausted, pale, shortness of breath)--SEEK MEDICAL ATTENTION RIGHT AWAY Pay attention to LVAD alerts Monitor stools for blood Obtain home INR and call to Cardiology office for medication adjustment weekly as ordered Monitor incisions for s/s of infection Attend scheduled medical appointments Work with home health therapy  Follow Up Plan: The care management team will reach out to the patient again over the next 14 business days.       Plan:Telephone follow up appointment with care management team member scheduled for:  14 business days.  Johnney Killian, RN, BSN, CCM Care Management Coordinator Phone:  [phone] / Fax: [phone]

## 2021-08-22 NOTE — Patient Instructions (Signed)
Visit Information   Goals Addressed             This Visit's Progress    (RNCM) Track and Manage Fluids and Swelling       Timeframe:  Long-Range Goal Priority:  High Start Date:  01/03/21                           Expected End Date:   02/03/22                    Follow Up Date  09/05/2021    Call office if I gain more than 2 pounds in one day or 5 pounds in one week Weigh myself daily and record in log along with LVAD daily parameters for provider review Monitor lower extremities and abdomen for swelling Take torsemide daily as prescribed Call LVAD team as soon as possible with increase in shortness of breath, do not wait days later Take doppler and BP cuff to next cardiology appointment to have staff assist you in learning how to use equipment Have fiance assist with monitoring/Doppler blood pressures daily  Why is this important?   It is important to check your weight daily and watch how much salt and liquids you have.  It will help you to manage your heart failure.    Notes:      (RNCM) Track and Manage My Symptoms-COPD       Timeframe:  Long-Range Goal Priority:  High Start Date:  01/03/21                           Expected End Date:   01/03/22                    Follow Up Date 09/05/21    Develop a rescue plan and follow if symptoms flare up Monitor and Eliminate symptom triggers at home [place] for home (contact local fire department to seek assistance in obtaining) Contact provider for increase use of rescue inhaler/nebulizer (use rescue inhaler and nebulizer for increase in shortness of breath) Call LVAD team for increase in shortness of breath on day 2 instead of waiting days later when it is worse   Why is this important?   Tracking your symptoms and other information about your health helps your doctor plan your care.  Write down the symptoms, the time of day, what you were doing and what medicine you are taking.  You will soon learn how to manage  your symptoms.     Notes:      Increase physical activity       Stay active Note: Patient continues with back pain which is limiting her activity.        The patient verbalized understanding of instructions, educational materials, and care plan provided today and declined offer to receive copy of patient instructions, educational materials, and care plan.   Telephone follow up appointment with care management team member scheduled for: 09/12/21@1PM   Johnney Killian, RN, BSN, CCM Care Management Coordinator Phone: [phone] / Fax: [phone]

## 2021-08-23 ENCOUNTER — Telehealth: Payer: Self-pay | Admitting: Internal Medicine

## 2021-08-23 ENCOUNTER — Other Ambulatory Visit: Payer: Self-pay | Admitting: Internal Medicine

## 2021-08-23 DIAGNOSIS — M545 Low back pain, unspecified: Secondary | ICD-10-CM

## 2021-08-23 NOTE — Telephone Encounter (Signed)
Please ask Ashley [name] to get x ryas tomorrow at [facility] so I will have them to view when I see her on Thursday

## 2021-08-23 NOTE — Telephone Encounter (Signed)
Patient has been notified

## 2021-08-24 ENCOUNTER — Ambulatory Visit
Admission: RE | Admit: 2021-08-24 | Discharge: 2021-08-24 | Disposition: A | Discharge status: Home / Self Care | Payer: Medicare Other | Source: Ambulatory Visit | Attending: Internal Medicine | Admitting: Internal Medicine

## 2021-08-24 ENCOUNTER — Other Ambulatory Visit: Payer: Self-pay | Admitting: Internal Medicine

## 2021-08-24 ENCOUNTER — Other Ambulatory Visit: Payer: Medicare Other

## 2021-08-24 ENCOUNTER — Ambulatory Visit
Admission: RE | Admit: 2021-08-24 | Discharge: 2021-08-24 | Disposition: A | Discharge status: Home / Self Care | Payer: Medicare Other | Source: Home / Self Care | Attending: Internal Medicine | Admitting: Internal Medicine

## 2021-08-24 DIAGNOSIS — M545 Low back pain, unspecified: Secondary | ICD-10-CM

## 2021-08-25 ENCOUNTER — Encounter: Payer: Self-pay | Admitting: Internal Medicine

## 2021-08-25 ENCOUNTER — Telehealth: Payer: Self-pay

## 2021-08-25 ENCOUNTER — Ambulatory Visit (INDEPENDENT_AMBULATORY_CARE_PROVIDER_SITE_OTHER): Payer: Medicare Other | Admitting: Internal Medicine

## 2021-08-25 VITALS — Ht 63.0 in | Wt 148.0 lb

## 2021-08-25 DIAGNOSIS — M8448XA Pathological fracture, other site, initial encounter for fracture: Secondary | ICD-10-CM

## 2021-08-25 DIAGNOSIS — M545 Low back pain, unspecified: Secondary | ICD-10-CM | POA: Insufficient documentation

## 2021-08-25 DIAGNOSIS — G8929 Other chronic pain: Secondary | ICD-10-CM | POA: Diagnosis not present

## 2021-08-25 DIAGNOSIS — M8000XA Age-related osteoporosis with current pathological fracture, unspecified site, initial encounter for fracture: Secondary | ICD-10-CM

## 2021-08-25 MED ORDER — LIDOCAINE 5 % EX PTCH: 1.0000 | MEDICATED_PATCH | CUTANEOUS | 0 refills | Status: DC

## 2021-08-25 MED ORDER — HYDROMORPHONE HCL 2 MG PO TABS: 2.0000 mg | ORAL_TABLET | Freq: Four times a day (QID) | ORAL | 0 refills | Status: DC | PRN

## 2021-08-25 NOTE — Addendum Note (Signed)
Addended by: [name] on: 08/25/2021 07:55 PM   Modules accepted: Orders

## 2021-08-25 NOTE — Progress Notes (Addendum)
Telephone  Note  This visit type was conducted due to national recommendations for restrictions regarding the COVID-19 pandemic (e.g. social distancing).  This format is felt to be most appropriate for this patient at this time.  All issues noted in this document were discussed and addressed.  No physical exam was performed (except for noted visual exam findings with Video Visits).   I connected withNAME@ on 08/25/21 at  2:00 PM EDT by telephone and verified that I am speaking with the correct person using two identifiers. Location patient: home Location provider: work or home office Persons participating in the virtual visit: patient, provider  I discussed the limitations, risks, security and privacy concerns of performing an evaluation and management service by telephone and the availability of in person appointments. I also discussed with the patient that there may be a patient responsible charge related to this service. The patient expressed understanding and agreed to proceed.  Interactive audio and video telecommunications were attempted between this provider and patient, however failed, due to patient not having access to video capability.  We continued and completed visit with audio only.   Reason for visit: persistent low back pain   HPI:  70 yr old female with persistent low back pain since her hospitalization at [facility] in September . Patient was seen in person on Sept 15 for hospital follow up.  See prior noe for details.  During follow up she reported  left upper back pain, that she described as a "pulled muscle" that happened during hospitalization and was treated with ice packs  without any formal evaluation for cause.  The pain had persisted and she was nontender on exam.  A muscle relaxer was added.  During a recent CCM call she reported that her pain has persisted in her lower back and she was sent for x rays prior to today's telephone visit , which unfortunately have not been read  .  She states that the pain is midline and below the level of her hips but above her natal clef by 5 or 6 inches.  It does not radiate. It is aggravated by all movement and resolves only when she lies still.  She has been taking 2 mg dilaudid and a muscle relaxer, which allows her to sleep, but she is running out of the dilaudid.    ROS: See pertinent positives and negatives per HPI.  Past Medical History:  Diagnosis Date   AICD (automatic cardioverter/defibrillator) present    on right side   Anemia    CAD (coronary artery disease)    s/p CABG   CHF (congestive heart failure) (HCC)    Chronic kidney disease    renal artery stenosis   Colon cancer (HCC)    Depression    GERD (gastroesophageal reflux disease)    Hx of colonic polyps    Hyperlipidemia    Hypertension    Mitral valve disorder    s/p mitral valve repair wth CABG   Myocardial infarction [facility])    Peripheral vascular disease ([place])    Presence of permanent cardiac pacemaker    Pacemaker/ Defibrillator   Tobacco abuse 10/11/2014    Past Surgical History:  Procedure Laterality Date   arm surgery     fracture, has plates and screws   CABG with mitral valve repair     CARDIAC CATHETERIZATION     COLON SURGERY     colon cancer   COLONOSCOPY WITH PROPOFOL N/A 10/09/2016   Procedure: COLONOSCOPY WITH PROPOFOL;  Surgeon: Jonathon Bellows, MD;  Location: [facility] ENDOSCOPY;  Service: Endoscopy;  Laterality: N/A;   COLONOSCOPY WITH PROPOFOL N/A 07/24/2018   Procedure: COLONOSCOPY WITH PROPOFOL;  Surgeon: Virgel Manifold, MD;  Location: [facility] ENDOSCOPY;  Service: Endoscopy;  Laterality: N/A;   CORONARY ARTERY BYPASS GRAFT     ENTEROSCOPY N/A 11/21/2018   Procedure: ENTEROSCOPY-BALLOON;  Surgeon: Jonathon Bellows, MD;  Location: [facility] ENDOSCOPY;  Service: Gastroenterology;  Laterality: N/A;   ESOPHAGOGASTRODUODENOSCOPY (EGD) WITH PROPOFOL N/A 07/24/2018   Procedure: ESOPHAGOGASTRODUODENOSCOPY (EGD) WITH PROPOFOL;  Surgeon: Virgel Manifold, MD;  Location: [facility] ENDOSCOPY;  Service: Endoscopy;  Laterality: N/A;   GIVENS CAPSULE STUDY N/A 08/16/2018   Procedure: GIVENS CAPSULE STUDY;  Surgeon: Virgel Manifold, MD;  Location: [facility] ENDOSCOPY;  Service: Endoscopy;  Laterality: N/A;   pace maker defib  2016   PERIPHERAL VASCULAR CATHETERIZATION N/A 10/23/2016   Procedure: Renal Angiography;  Surgeon: Algernon Huxley, MD;  Location: [place] CV LAB;  Service: Cardiovascular;  Laterality: N/A;   RIGHT/LEFT HEART CATH AND CORONARY ANGIOGRAPHY N/A 08/26/2019   Procedure: RIGHT/LEFT HEART CATH AND CORONARY ANGIOGRAPHY;  Surgeon: Isaias Cowman, MD;  Location: [place] CV LAB;  Service: Cardiovascular;  Laterality: N/A;   TOTAL ABDOMINAL HYSTERECTOMY  1999   history of abnormal pap    Family History  Problem Relation Age of Onset   Arthritis Mother    Cancer Mother        uterus cancer   Hyperlipidemia Mother    Hypertension Mother    Heart disease Mother    Diabetes Mother    Hyperlipidemia Father    Hypertension Father    Heart disease Father    Diabetes Father    Cancer Sister        ovary cancer   Diabetes Maternal Grandmother    Hypertension Maternal Grandmother    Arthritis Maternal Grandmother    Hypertension Maternal Grandfather    Hypertension Paternal Grandmother    Hypertension Paternal Grandfather    Heart disease Paternal Grandfather    Heart disease Brother    Kidney disease Brother    Breast cancer Neg Hx     SOCIAL HX:  reports that she quit smoking about 2 years ago. Her smoking use included cigarettes. She smoked an average of .25 packs per day. She has never used smokeless tobacco. She reports current alcohol use of about 2.0 standard drinks per week. She reports that she does not use drugs.    Current Outpatient Medications:    acetaminophen (TYLENOL) 325 MG tablet, Take 650 mg by mouth every 6 (six) hours as needed., Disp: , Rfl:    albuterol (PROAIR HFA) 108 (90 Base)  MCG/ACT inhaler, Inhale 1-2 puffs into the lungs every 6 (six) hours as needed for wheezing or shortness of breath., Disp: 8 g, Rfl: 2   allopurinol (ZYLOPRIM) 100 MG tablet, TAKE ONE TABLET BY MOUTH ONCE DAILY, Disp: 30 tablet, Rfl: 5   ALPRAZolam (XANAX) 0.5 MG tablet, TAKE 1/2 TO 1 TABLET BY MOUTH AT BEDTIME AS NEEDED FOR ANXIETY, Disp: 30 tablet, Rfl: 3   amitriptyline (ELAVIL) 25 MG tablet, Take 1 tablet (25 mg total) by mouth at bedtime., Disp: 90 tablet, Rfl: 0   amLODipine (NORVASC) 2.5 MG tablet, Take 2.5 mg by mouth daily., Disp: , Rfl:    atorvastatin (LIPITOR) 40 MG tablet, TAKE ONE TABLET BY MOUTH ONCE DAILY, Disp: 90 tablet, Rfl: 1   carvedilol (COREG) 25 MG tablet, Take 25 mg by mouth  2 (two) times daily., Disp: , Rfl:    colchicine 0.6 MG tablet, , Disp: , Rfl:    cyanocobalamin (,VITAMIN B-12,) 1000 MCG/ML injection, Inject 1 mL into the muscle monthly (Patient taking differently: Inject 1,000 mcg into the muscle every 30 (thirty) days.), Disp: 10 mL, Rfl: 0   escitalopram (LEXAPRO) 10 MG tablet, TAKE ONE TABLET BY MOUTH ONCE DAILY, Disp: 90 tablet, Rfl: 1   ferrous sulfate 325 (65 FE) MG tablet, Take 325 mg by mouth daily with breakfast., Disp: , Rfl:    fluticasone (FLONASE) 50 MCG/ACT nasal spray, Place 1 spray into both nostrils as needed for allergies or rhinitis., Disp: , Rfl:    folic acid (FOLVITE) 1 MG tablet, TAKE ONE TABLET BY MOUTH ONCE DAILY, Disp: 30 tablet, Rfl: 0   ipratropium-albuterol (DUONEB) 0.5-2.5 (3) MG/3ML SOLN, USE 1 VIAL VIA NEBULIZER AND INHALE BY MOUTH INTO THE LUNGS EVERY 6 HOURS AS NEEDED, Disp: 360 mL, Rfl: 1   levothyroxine (SYNTHROID) 75 MCG tablet, Take 1 tablet (75 mcg total) by mouth daily., Disp: 90 tablet, Rfl: 1   magnesium oxide (MAG-OX) 400 (241.3 Mg) MG tablet, Take 1 tablet by mouth every Monday, Wednesday, and Friday., Disp: , Rfl:    mupirocin ointment (BACTROBAN) 2 %, Apply topically daily., Disp: , Rfl:    omeprazole (PRILOSEC) 40 MG  capsule, TAKE ONE CAPSULE BY MOUTH ONCE DAILY (Patient taking differently: Take 40 mg by mouth daily.), Disp: 90 capsule, Rfl: 1   potassium chloride (KLOR-CON) 10 MEQ tablet, Take 10 mEq by mouth every Monday, Wednesday, and Friday. Take 2 tablets every Monday Wednesday Friday, Disp: , Rfl:    senna-docusate (SENOKOT-S) 8.6-50 MG tablet, Take by mouth., Disp: , Rfl:    spironolactone (ALDACTONE) 25 MG tablet, Take 25 mg by mouth daily., Disp: , Rfl:    tiZANidine (ZANAFLEX) 2 MG tablet, TAKE ONE TABLET BY MOUTH EVERY 6 HOURS AS NEEDED FOR MUSCLE SPASM, Disp: 30 tablet, Rfl: 0   torsemide (DEMADEX) 20 MG tablet, Take 20 mg by mouth daily., Disp: , Rfl:    traZODone (DESYREL) 50 MG tablet, SMARTSIG:0.5-1 Tablet(s) By Mouth Every Evening, Disp: , Rfl:    TRELEGY ELLIPTA 100-62.5-25 MCG/INH AEPB, INHALE 1 PUFF BY MOUTH INTO THE LUNGS EVERY DAY, Disp: 60 each, Rfl: 2   warfarin (COUMADIN) 2 MG tablet, Take 6 mg by mouth at bedtime. Every day except on Thursday take 8mg ., Disp: , Rfl:    GNP MUCUS ER 600 MG 12 hr tablet, Take 600 mg by mouth 2 (two) times daily. (Patient not taking: No sig reported), Disp: , Rfl:    HYDROmorphone (DILAUDID) 2 MG tablet, Take 1 tablet (2 mg total) by mouth every 6 (six) hours as needed. FOR CHRONIC LOW BACK PAIN, Disp: 120 tablet, Rfl: 0   lidocaine (LIDODERM) 5 %, Place 1 patch onto the skin daily. Remove & Discard patch within 12 hours or as directed by MD, Disp: 30 patch, Rfl: 0   sacubitril-valsartan (ENTRESTO) 97-103 MG, Take 1 tablet by mouth 2 (two) times daily. (Patient not taking: Reported on 08/25/2021), Disp: , Rfl:   EXAM:  VITALS per patient if applicable:  GENERAL: alert, oriented, appears well and in no acute distress   General impression: alert, cooperative and articulate.  No signs of being in distress  Lungs: speech is fluent sentence length suggests that patient is not short of breath and not punctuated by cough, sneezing or sniffing. [name]   Psych:  affect normal.  speech is articulate and non pressured .  Denies suicidal thoughts   ASSESSMENT AND PLAN:  Discussed the following assessment and plan:  Chronic midline low back pain without sciatica - Plan: Ambulatory referral to [place]  Osteoporosis with current pathological fracture, unspecified osteoporosis type, initial encounter  Pathological fracture of vertebra, unspecified pathological cause, initial encounter - Plan: DG Bone Density  Low back pain without sciatica She is emphatic about the pain being unchanged in location or character since her August admission to [facility]. Plain films done yesterday suggest that she has sustained T12 , L2 and possbly L4  Vertebral fractures.  Refilling dilaudid.  Patient is receiving home health care from [facility];  Will add PT evaluation and treat. She cannot have an MRI because she has a defibrillator and she cannot take NSAIDs due to history of GI bleed   Osteoporosis with current pathological fracture Multiple vertebral fractures without trauma ,  Suggestive of osteoporosis    I discussed the assessment and treatment plan with the patient. The patient was provided an opportunity to ask questions and all were answered. The patient agreed with the plan and demonstrated an understanding of the instructions.   The patient was advised to call back or seek an in-person evaluation if the symptoms worsen or if the condition fails to improve as anticipated.   I spent 30 minutes dedicated to the care of this patient on the date of this encounter to include pre-visit review of her medical history,  non  Face-to-face time with the patient , and post visit review of  testing and therapeutics.    [name], MD

## 2021-08-25 NOTE — Addendum Note (Signed)
Addended by: [name] on: 08/25/2021 08:14 PM   Modules accepted: Orders

## 2021-08-25 NOTE — Assessment & Plan Note (Signed)
Multiple vertebral fractures without trauma ,  Suggestive of osteoporosis

## 2021-08-25 NOTE — Telephone Encounter (Signed)
She has two compression fractures in her lower back, at   T12 and L2  and another possible fracture at L4 .  I am adding a a lidocaine patch that she can apply to her lower back and wear for 12 hours daily .  I have also ordered a DEXA scan because she will need to start medication for osteoporosis once I see the resuls.

## 2021-08-25 NOTE — Assessment & Plan Note (Addendum)
She is emphatic about the pain being unchanged in location or character since her August admission to [facility]. Plain films done yesterday suggest that she has sustained T12 , L2 and possbly L4  Vertebral fractures.  Refilling dilaudid.  Patient is receiving home health care from [facility];  Will add PT evaluation and treat. She cannot have an MRI because she has a defibrillator and she cannot take NSAIDs due to history of GI bleed

## 2021-08-25 NOTE — Telephone Encounter (Signed)
[facility] radiology called to give report that is on patient. Impression is as followed.    1. Moderate T12 and mild L2 compression fractures, age indeterminate. 2. Undulation of the inferior endplate of L4, may represent compression fracture, age indeterminate. 3. Mild multilevel spondylosis with endplate spurring. 4. Diffuse bony under mineralization with osteopenia/osteoporosis.

## 2021-08-29 NOTE — Telephone Encounter (Signed)
Pt notified and will be going to get bone density scan

## 2021-08-31 DIAGNOSIS — L89151 Pressure ulcer of sacral region, stage 1: Secondary | ICD-10-CM | POA: Diagnosis not present

## 2021-08-31 DIAGNOSIS — I5023 Acute on chronic systolic (congestive) heart failure: Secondary | ICD-10-CM | POA: Diagnosis not present

## 2021-08-31 DIAGNOSIS — E1122 Type 2 diabetes mellitus with diabetic chronic kidney disease: Secondary | ICD-10-CM | POA: Diagnosis not present

## 2021-08-31 DIAGNOSIS — T8149XD Infection following a procedure, other surgical site, subsequent encounter: Secondary | ICD-10-CM | POA: Diagnosis not present

## 2021-08-31 DIAGNOSIS — I130 Hypertensive heart and chronic kidney disease with heart failure and stage 1 through stage 4 chronic kidney disease, or unspecified chronic kidney disease: Secondary | ICD-10-CM | POA: Diagnosis not present

## 2021-09-02 ENCOUNTER — Telehealth: Payer: Medicare Other | Admitting: Internal Medicine

## 2021-09-05 DIAGNOSIS — I5032 Chronic diastolic (congestive) heart failure: Secondary | ICD-10-CM

## 2021-09-11 DIAGNOSIS — E039 Hypothyroidism, unspecified: Secondary | ICD-10-CM | POA: Diagnosis not present

## 2021-09-11 DIAGNOSIS — I499 Cardiac arrhythmia, unspecified: Secondary | ICD-10-CM | POA: Diagnosis not present

## 2021-09-11 DIAGNOSIS — J90 Pleural effusion, not elsewhere classified: Secondary | ICD-10-CM | POA: Diagnosis not present

## 2021-09-11 DIAGNOSIS — E785 Hyperlipidemia, unspecified: Secondary | ICD-10-CM | POA: Diagnosis not present

## 2021-09-11 DIAGNOSIS — D62 Acute posthemorrhagic anemia: Secondary | ICD-10-CM | POA: Diagnosis not present

## 2021-09-11 DIAGNOSIS — Z8719 Personal history of other diseases of the digestive system: Secondary | ICD-10-CM | POA: Diagnosis not present

## 2021-09-11 DIAGNOSIS — K551 Chronic vascular disorders of intestine: Secondary | ICD-10-CM | POA: Diagnosis not present

## 2021-09-11 DIAGNOSIS — Z85038 Personal history of other malignant neoplasm of large intestine: Secondary | ICD-10-CM | POA: Diagnosis not present

## 2021-09-11 DIAGNOSIS — R0602 Shortness of breath: Secondary | ICD-10-CM | POA: Diagnosis not present

## 2021-09-11 DIAGNOSIS — Z95810 Presence of automatic (implantable) cardiac defibrillator: Secondary | ICD-10-CM | POA: Diagnosis not present

## 2021-09-11 DIAGNOSIS — R000 Tachycardia, unspecified: Secondary | ICD-10-CM | POA: Diagnosis not present

## 2021-09-11 DIAGNOSIS — I4891 Unspecified atrial fibrillation: Secondary | ICD-10-CM | POA: Diagnosis not present

## 2021-09-11 DIAGNOSIS — Z952 Presence of prosthetic heart valve: Secondary | ICD-10-CM | POA: Diagnosis not present

## 2021-09-11 DIAGNOSIS — R0689 Other abnormalities of breathing: Secondary | ICD-10-CM | POA: Diagnosis not present

## 2021-09-11 DIAGNOSIS — Z4502 Encounter for adjustment and management of automatic implantable cardiac defibrillator: Secondary | ICD-10-CM | POA: Diagnosis not present

## 2021-09-11 DIAGNOSIS — I7143 Infrarenal abdominal aortic aneurysm, without rupture: Secondary | ICD-10-CM | POA: Diagnosis not present

## 2021-09-11 DIAGNOSIS — I2510 Atherosclerotic heart disease of native coronary artery without angina pectoris: Secondary | ICD-10-CM | POA: Diagnosis not present

## 2021-09-11 DIAGNOSIS — I491 Atrial premature depolarization: Secondary | ICD-10-CM | POA: Diagnosis not present

## 2021-09-11 DIAGNOSIS — I5023 Acute on chronic systolic (congestive) heart failure: Secondary | ICD-10-CM | POA: Diagnosis not present

## 2021-09-11 DIAGNOSIS — E1140 Type 2 diabetes mellitus with diabetic neuropathy, unspecified: Secondary | ICD-10-CM | POA: Diagnosis not present

## 2021-09-11 DIAGNOSIS — I272 Pulmonary hypertension, unspecified: Secondary | ICD-10-CM | POA: Diagnosis not present

## 2021-09-11 DIAGNOSIS — Z95811 Presence of heart assist device: Secondary | ICD-10-CM | POA: Diagnosis not present

## 2021-09-11 DIAGNOSIS — Z951 Presence of aortocoronary bypass graft: Secondary | ICD-10-CM | POA: Diagnosis not present

## 2021-09-11 DIAGNOSIS — N289 Disorder of kidney and ureter, unspecified: Secondary | ICD-10-CM | POA: Diagnosis not present

## 2021-09-11 DIAGNOSIS — I255 Ischemic cardiomyopathy: Secondary | ICD-10-CM | POA: Diagnosis not present

## 2021-09-11 DIAGNOSIS — J449 Chronic obstructive pulmonary disease, unspecified: Secondary | ICD-10-CM | POA: Diagnosis not present

## 2021-09-11 DIAGNOSIS — J811 Chronic pulmonary edema: Secondary | ICD-10-CM | POA: Diagnosis not present

## 2021-09-11 DIAGNOSIS — Z743 Need for continuous supervision: Secondary | ICD-10-CM | POA: Diagnosis not present

## 2021-09-11 DIAGNOSIS — D689 Coagulation defect, unspecified: Secondary | ICD-10-CM | POA: Diagnosis not present

## 2021-09-11 DIAGNOSIS — I428 Other cardiomyopathies: Secondary | ICD-10-CM | POA: Diagnosis not present

## 2021-09-11 DIAGNOSIS — Z7901 Long term (current) use of anticoagulants: Secondary | ICD-10-CM | POA: Diagnosis not present

## 2021-09-11 DIAGNOSIS — I130 Hypertensive heart and chronic kidney disease with heart failure and stage 1 through stage 4 chronic kidney disease, or unspecified chronic kidney disease: Secondary | ICD-10-CM | POA: Diagnosis not present

## 2021-09-11 DIAGNOSIS — N183 Chronic kidney disease, stage 3 unspecified: Secondary | ICD-10-CM | POA: Diagnosis not present

## 2021-09-11 DIAGNOSIS — R58 Hemorrhage, not elsewhere classified: Secondary | ICD-10-CM | POA: Diagnosis not present

## 2021-09-11 DIAGNOSIS — D509 Iron deficiency anemia, unspecified: Secondary | ICD-10-CM | POA: Diagnosis not present

## 2021-09-11 DIAGNOSIS — Z20822 Contact with and (suspected) exposure to covid-19: Secondary | ICD-10-CM | POA: Diagnosis not present

## 2021-09-11 DIAGNOSIS — T829XXA Unspecified complication of cardiac and vascular prosthetic device, implant and graft, initial encounter: Secondary | ICD-10-CM | POA: Diagnosis not present

## 2021-09-11 DIAGNOSIS — I5022 Chronic systolic (congestive) heart failure: Secondary | ICD-10-CM | POA: Diagnosis not present

## 2021-09-11 DIAGNOSIS — E1151 Type 2 diabetes mellitus with diabetic peripheral angiopathy without gangrene: Secondary | ICD-10-CM | POA: Diagnosis not present

## 2021-09-11 DIAGNOSIS — R0600 Dyspnea, unspecified: Secondary | ICD-10-CM | POA: Diagnosis not present

## 2021-09-11 DIAGNOSIS — Z953 Presence of xenogenic heart valve: Secondary | ICD-10-CM | POA: Diagnosis not present

## 2021-09-11 DIAGNOSIS — I252 Old myocardial infarction: Secondary | ICD-10-CM | POA: Diagnosis not present

## 2021-09-11 DIAGNOSIS — E1122 Type 2 diabetes mellitus with diabetic chronic kidney disease: Secondary | ICD-10-CM | POA: Diagnosis not present

## 2021-09-11 DIAGNOSIS — D500 Iron deficiency anemia secondary to blood loss (chronic): Secondary | ICD-10-CM | POA: Diagnosis not present

## 2021-09-12 ENCOUNTER — Ambulatory Visit: Payer: Medicare Other | Admitting: *Deleted

## 2021-09-12 DIAGNOSIS — J441 Chronic obstructive pulmonary disease with (acute) exacerbation: Secondary | ICD-10-CM

## 2021-09-12 DIAGNOSIS — I5023 Acute on chronic systolic (congestive) heart failure: Secondary | ICD-10-CM

## 2021-09-12 NOTE — Chronic Care Management (AMB) (Signed)
  Care Management   Follow Up Note   09/12/2021 Name: Ashley [name] MRN: [id number] DOB: 26-Jun-1951   Referred by: [name], MD Reason for referral : Chronic Care Management (COPD, HF)   Outreach to patient for follow up.  Female answered and stated patient in the hospital.  Follow Up Plan:  RNCM will monitor chart for discharge and outreach patient once discharged from hospital.  [place], MSN RN Care Management Coordinator [place] [phone] [email or web address]

## 2021-09-19 ENCOUNTER — Ambulatory Visit (INDEPENDENT_AMBULATORY_CARE_PROVIDER_SITE_OTHER): Payer: Medicare Other | Admitting: *Deleted

## 2021-09-19 DIAGNOSIS — K5520 Angiodysplasia of colon without hemorrhage: Secondary | ICD-10-CM

## 2021-09-19 DIAGNOSIS — D509 Iron deficiency anemia, unspecified: Secondary | ICD-10-CM

## 2021-09-19 DIAGNOSIS — D62 Acute posthemorrhagic anemia: Secondary | ICD-10-CM

## 2021-09-19 DIAGNOSIS — J439 Emphysema, unspecified: Secondary | ICD-10-CM

## 2021-09-19 DIAGNOSIS — I5032 Chronic diastolic (congestive) heart failure: Secondary | ICD-10-CM

## 2021-09-20 ENCOUNTER — Telehealth: Payer: Self-pay

## 2021-09-20 MED ORDER — TIZANIDINE HCL 2 MG PO TABS: ORAL_TABLET | ORAL | 0 refills | Status: AC

## 2021-09-20 NOTE — Telephone Encounter (Signed)
Patient following up with [facility] for transition of care post discharge 09/16/21. Home health services in progress. [facility] CCM scheduled call 09/19/21 post hospital follow up as well. No transition of care appointment scheduled with PCP at this time. Patient requests refill on Zanaflex 2mg  sent to local pharmacy for continued back pain. Medication pended for approval.

## 2021-09-21 ENCOUNTER — Ambulatory Visit: Payer: Medicare Other | Admitting: *Deleted

## 2021-09-21 DIAGNOSIS — I5043 Acute on chronic combined systolic (congestive) and diastolic (congestive) heart failure: Secondary | ICD-10-CM

## 2021-09-21 DIAGNOSIS — J439 Emphysema, unspecified: Secondary | ICD-10-CM

## 2021-09-21 DIAGNOSIS — M8448XA Pathological fracture, other site, initial encounter for fracture: Secondary | ICD-10-CM

## 2021-09-21 DIAGNOSIS — D62 Acute posthemorrhagic anemia: Secondary | ICD-10-CM

## 2021-09-21 NOTE — Chronic Care Management (AMB) (Signed)
Chronic Care Management   CCM RN Visit Note  09/21/2021 Name: Ashley [name] MRN: [id number] DOB: 1950-12-29  Subjective: Ashley [name] is a 70 y.o. year old female who is a primary care patient of [name], MD. The care management team was consulted for assistance with disease management and care coordination needs.    Engaged with patient by telephone for follow up visit in response to provider referral for case management and/or care coordination services.   Consent to Services:  The patient was given information about Chronic Care Management services, agreed to services, and gave verbal consent prior to initiation of services.  Please see initial visit note for detailed documentation.   Patient agreed to services and verbal consent obtained.   Assessment: Review of patient past medical history, allergies, medications, health status, including review of consultants reports, laboratory and other test data, was performed as part of comprehensive evaluation and provision of chronic care management services.   SDOH (Social Determinants of Health) assessments and interventions performed:    CCM Care Plan  Allergies  Allergen Reactions   Oxycodone     Other reaction(s): Hallucination Hallucinations to oxycodone noted while inpatient 06/2021. Tolerates hydromorphone.    Vancomycin Nausea And Vomiting and Palpitations   Hydrocodone-Acetaminophen Nausea Only    Outpatient Encounter Medications as of 09/21/2021  Medication Sig Note   acetaminophen (TYLENOL) 325 MG tablet Take 650 mg by mouth every 6 (six) hours as needed.    albuterol (PROAIR HFA) 108 (90 Base) MCG/ACT inhaler Inhale 1-2 puffs into the lungs every 6 (six) hours as needed for wheezing or shortness of breath.    allopurinol (ZYLOPRIM) 100 MG tablet TAKE ONE TABLET BY MOUTH ONCE DAILY    ALPRAZolam (XANAX) 0.5 MG tablet TAKE 1/2 TO 1 TABLET BY MOUTH AT BEDTIME AS NEEDED FOR ANXIETY    amitriptyline (ELAVIL)  25 MG tablet Take 1 tablet (25 mg total) by mouth at bedtime.    amLODipine (NORVASC) 2.5 MG tablet Take 2.5 mg by mouth daily.    atorvastatin (LIPITOR) 40 MG tablet TAKE ONE TABLET BY MOUTH ONCE DAILY    carvedilol (COREG) 25 MG tablet Take 25 mg by mouth 2 (two) times daily.    colchicine 0.6 MG tablet  01/03/2021: Reports taking only when gout flares up   cyanocobalamin (,VITAMIN B-12,) 1000 MCG/ML injection Inject 1 mL into the muscle monthly (Patient taking differently: Inject 1,000 mcg into the muscle every 30 (thirty) days.)    escitalopram (LEXAPRO) 10 MG tablet TAKE ONE TABLET BY MOUTH ONCE DAILY    ferrous sulfate 325 (65 FE) MG tablet Take 325 mg by mouth daily with breakfast.    fluticasone (FLONASE) 50 MCG/ACT nasal spray Place 1 spray into both nostrils as needed for allergies or rhinitis.    folic acid (FOLVITE) 1 MG tablet TAKE ONE TABLET BY MOUTH ONCE DAILY    GNP MUCUS ER 600 MG 12 hr tablet Take 600 mg by mouth 2 (two) times daily. (Patient not taking: No sig reported) 01/03/2021: Reports taking as needed   HYDROmorphone (DILAUDID) 2 MG tablet Take 1 tablet (2 mg total) by mouth every 6 (six) hours as needed. FOR CHRONIC LOW BACK PAIN    ipratropium-albuterol (DUONEB) 0.5-2.5 (3) MG/3ML SOLN USE 1 VIAL VIA NEBULIZER AND INHALE BY MOUTH INTO THE LUNGS EVERY 6 HOURS AS NEEDED    levothyroxine (SYNTHROID) 75 MCG tablet Take 1 tablet (75 mcg total) by mouth daily.    lidocaine (LIDODERM)  5 % Place 1 patch onto the skin daily. Remove & Discard patch within 12 hours or as directed by MD    magnesium oxide (MAG-OX) 400 (241.3 Mg) MG tablet Take 1 tablet by mouth every Monday, Wednesday, and Friday.    mupirocin ointment (BACTROBAN) 2 % Apply topically daily.    omeprazole (PRILOSEC) 40 MG capsule TAKE ONE CAPSULE BY MOUTH ONCE DAILY (Patient taking differently: Take 40 mg by mouth daily.) 01/03/2021: Reports taking daily   potassium chloride (KLOR-CON) 10 MEQ tablet Take 10 mEq by mouth  every Monday, Wednesday, and Friday. Take 2 tablets every Monday Wednesday Friday 08/25/2021: PRN with fluid pill   sacubitril-valsartan (ENTRESTO) 97-103 MG Take 1 tablet by mouth 2 (two) times daily. (Patient not taking: Reported on 08/25/2021)    senna-docusate (SENOKOT-S) 8.6-50 MG tablet Take by mouth.    spironolactone (ALDACTONE) 25 MG tablet Take 25 mg by mouth daily.    tiZANidine (ZANAFLEX) 2 MG tablet TAKE ONE TABLET BY MOUTH EVERY 6 HOURS AS NEEDED FOR MUSCLE SPASM    torsemide (DEMADEX) 20 MG tablet Take 20 mg by mouth daily. 08/25/2021: PRN   traZODone (DESYREL) 50 MG tablet SMARTSIG:0.5-1 Tablet(s) By Mouth Every Evening    TRELEGY ELLIPTA 100-62.5-25 MCG/INH AEPB INHALE 1 PUFF BY MOUTH INTO THE LUNGS EVERY DAY    warfarin (COUMADIN) 2 MG tablet Take 6 mg by mouth at bedtime. Every day except on Thursday take 8mg .    No facility-administered encounter medications on file as of 09/21/2021.    Patient Active Problem List   Diagnosis Date Noted   Low back pain without sciatica 08/25/2021   Osteoporosis with current pathological fracture 08/25/2021   Muscle strain of left upper back 07/22/2021   Sequela, post-stroke 07/22/2021   S/P split thickness skin graft 07/11/2021   Hematoma 07/29/3006   Embolic stroke ([place]) 62/26/3335   Brachial artery occlusion, left ([place]) 06/02/2021   History of embolectomy 06/02/2021   History of fasciotomy 06/02/2021   Left arm pain 06/02/2021   Malignant neoplasm of colon, unspecified part of colon ([place]) 05/06/2021   Neuropathy 05/06/2021   PAF (paroxysmal atrial fibrillation) ([place]) 02/10/2021   Palpitation 02/10/2021   Red blood cell antibody positive 12/27/2020   Sternal wound infection 12/23/2020   Anticoagulation goal of INR 2 to 3 12/23/2020   LVAD (left ventricular assist device) present ([place]) 45/62/5638   Complication involving left ventricular assist device (LVAD) 12/23/2020   Hypothyroidism 12/23/2020   History of gastrointestinal  bleeding 10/18/2020   Left ventricular assist device (LVAD) complication, initial encounter 09/23/2020   Infection following procedure 09/03/2020   Chronic anticoagulation 08/03/2020   S/P MVR (mitral valve replacement) 07/16/2020   PHT (pulmonary hypertension) ([place]) 07/07/2020   Snoring 07/05/2020   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 ([place]) 07/05/2020   Ischemic cardiomyopathy 01/02/2020   CKD (chronic kidney disease), stage IIIa 12/31/2019   Pulmonary edema cardiac cause ([place]) 12/25/2019   Hyperlipidemia    Gouty arthritis of both feet 11/20/2019   Mitral stenosis with insufficiency    Hypertensive urgency    Depression    Hyperglycemia 09/22/2019   Acute respiratory failure with hypoxia ([place]) 09/22/2019   Presence of permanent cardiac pacemaker    Acute blood loss anemia    Chronic kidney disease    Hypertension    CHF (congestive heart failure) ([place]) 07/16/2019   Insomnia 05/07/2019   Suspected COVID-19 virus infection 03/23/2019   AVM (arteriovenous malformation) of small  bowel, acquired    Anemia, iron deficiency 11/10/2018   Rectal polyp    Benign neoplasm of cecum    Barrett's esophagus without dysplasia    Chronic diastolic heart failure ([place]) 07/03/2018   HTN (hypertension) 07/03/2018   COPD with emphysema ([place]) 06/28/2018   B12 deficiency anemia 06/28/2018   Personal history of colon cancer    Polyp of sigmoid colon    Diverticulosis of large intestine without diverticulitis    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   Renal artery stenosis ([place]) 10/03/2016   Encounter for assessment of implantable cardioverter-defibrillator (ICD) 05/06/2015   Failure of implantable cardioverter-defibrillator (ICD) lead 02/09/2015   Hospital discharge follow-up 02/09/2015   Cardiac resynchronization therapy defibrillator (CRT-D) in place 01/09/2015   Peripheral artery disease ([place]) 01/05/2015   Tobacco abuse 10/11/2014   S/P CABG x 3 07/03/2014   MI  (myocardial infarction) ([place]) 07/03/2014   Atherosclerosis of native artery of extremity with intermittent claudication ([place]) 06/18/2013   Preoperative evaluation to rule out surgical contraindication 06/18/2013   CAD (coronary artery disease) 06/01/2013   GERD (gastroesophageal reflux disease) 06/01/2013   Hypercholesterolemia 06/01/2013   Tubular adenoma of colon 06/01/2013   Major depressive disorder in remission ([place]) 06/01/2013    Conditions to be addressed/monitored:CHF, COPD, and Anemia and Spinal Fractures  Care Plan : RNCM   General Plan of Care (Adult)  Updates made by Leona Singleton, RN since 09/21/2021 12:00 AM     Problem: Knowledge defucuet related to self care managment of chronic  medical conditions   Priority: High     Long-Range Goal: Patient Clyde Lundborg CCM staff to learn self care skills to manage x   Start Date: 09/19/2021  Expected End Date: 09/19/2022  This Visit's Progress: Not on track  Priority: High  Note:   Current Barriers:  Knowledge Deficits related to plan of care for management of CHF, COPD, and Anemia/GI Bleed, and Spinal Fractures   Care Coordination needs related to Limited social support and Level of care concerns  Patient with recent discharge on 11/11 from [facility] related to anemia/GI bleed.  Received several blood transfusions; unsure of definite source of anemia/? GI bleed.  Was recovering from recent hospitalization in July/August for arm thrombectomy/fasciotomy.  Patient reporting wounds/incisions are healing well.  Diagnosed with spinal fracture post last hospitalization right before this hospitalization.  Patient is still in intense amount of back pain, limiting her ambulation making her have to use wheelchair to get around.  Since discharge patient stating she is feeling weak and tired.  Weight was 147 pounds.  Have not been able to check BP yet.  States she has had to use her rescue nebulizer about 3 times in the past 4 days (more than usual  for patient).  Encouraged patient to contact provider is still with increased need of nebulizer.  Denies any swelling.  Home Health ordered at discharge but has not made contact with patient as of yet.  No LVAD alarms.  States she has checked her INR and sent to Cardiology.  Has post hosp appointment with Cardiology on 09/22/21 (encouraged to keep and attend)  Continues to be in intense back pain. States pain medicine does help but makes her sleep.  Still using wheelchair to get around.  Has used rescue nebulizer twice since our conversation on Monday (still more than usual for patient).  Has not gotten up to weigh yet.  Has not heard from home health.  RNCM outreached to [place] [phone]  Raquel Sarna)  and was told [facility] was scheduled to come out Saturday but may try to come tomorrow pending scheduling.  Patient reporting Cardiology has rescheduled tomorrows post hosp appointment to 11/30/21.  Instructed and encouraged to contact PCP for post hosp follow up appointment  RNCM Clinical Goal(s):  Patient will verbalize understanding of plan for management of CHF, COPD, and Anemia and Spinal fractures as evidenced by daily weight monitoring, pain management, COPD/Heart failure action plan, monitoring INR levels as ordered verbalize basic understanding of CHF, COPD, and Anemia and Spinal Fractures disease process and self health management plan as evidenced by working with therapy, increasing activity as tolerated, following action plans demonstrate improved health management independence as evidenced by close communication with providers, making sure INR meter stays in working order; knowing when to call provider based on weight and shortness of breath, LVAD monitoring/alert monitoring, monitoring self for signs and symptoms of anemia        not experience hospital admission as evidenced by review of EMR. Hospital Admissions in last 6 months = 2  through collaboration with RN Care manager, provider, and care team.    Interventions: 1:1 collaboration with primary care provider regarding development and update of comprehensive plan of care as evidenced by provider attestation and co-signature Inter-disciplinary care team collaboration (see longitudinal plan of care) Evaluation of current treatment plan related to  self management and patient's adherence to plan as established by provider Provided patient with number to home health [facility] [phone]   Anemia/Bleeding:  (Status: Goal on track: NO.) Short Term Goal  Assessment of understanding of anemia/bleeding disorder diagnosis Basic overview and discussion of anemia/bleeding disorder or acute disease state Medications reviewed - Counseled on bleeding risk associated with coumadin therapy and importance of self-monitoring for signs/symptoms of bleeding; - Counseled on avoidance of NSAIDs due to increased bleeding risk; - Counseled on importance of regular laboratory monitoring as directed by provider; - Provided education about signs and symptoms of active bleeding such as stomach discomfort, coughing up blood or blood tinged secretions, bleeding from the gums/teeth, nosebleeds, increased bruising, blood in the urine/stool and/or if a traumatic injury occurs, regardless of severity of injury;  - Advised to call provider or 911 if active bleeding or signs and symptoms of active bleeding occur; - recommended promotion of rest and energy-conserving measures to manage fatigue, such as balancing activity with periods of rest - encouraged strategies to prevent falls related to fatigue, weakness and dizziness; encouraged sitting before standing and using an assistive device - encouraged optimal oral intake to support fluid balance and nutrition Continue to monitor yourself for low hemoglobin (feeling tired, exhausted, pale, shortness of breath)--SEEK MEDICAL ATTENTION RIGHT AWAY Discussed importance of msking sure INR machine stays in working order  Heart  Failure Interventions:  (Status: Goal on Track (progressing): YES.)  Long Term Goal  Basic overview and discussion of pathophysiology of Heart Failure reviewed Provided education on low sodium diet Reviewed Heart Failure Action Plan in depth and provided written copy Advised patient to weigh each morning after emptying bladder Discussed importance of daily weight and advised patient to weigh and record daily Discussed the importance of keeping all appointments with provider  COPD: (Status: Goal on track: NO.) Long Term Goal  Reviewed medications with patient, including use of prescribed maintenance and rescue inhalers, and provided instruction on medication management and the importance of adherence Provided patient with basic written and verbal COPD education on self care/management/and exacerbation prevention Advised patient to track and manage COPD triggers  Provided instruction about proper use of medications used for management of COPD including inhalers Advised patient to self assesses COPD action plan zone and make appointment with provider if in the yellow zone for 48 hours without improvement Discussed the importance of adequate rest and management of fatigue with COPD Encouraged to discuss increase SOB with PCP as soon as possible  Pain:  (Status: Goal on track: NO.) Short Term Goal  Pain assessment performed Medications reviewed Reviewed provider established plan for pain management; Discussed importance of adherence to all scheduled medical appointments; Counseled on the importance of reporting any/all new or changed pain symptoms or management strategies to pain management provider; Advised patient to report to care team affect of pain on daily activities; Discussed use of relaxation techniques and/or diversional activities to assist with pain reduction (distraction, imagery, relaxation, massage, acupressure, TENS, heat, and cold application; Reviewed with patient prescribed  pharmacological and nonpharmacological pain relief strategies; Advised patient to discuss continued, mobility limiting pain and increase shortness of breath  with provider; Discuss adding HHPT/OT with PCP  Patient Goals/Self-Care Activities: Patient will self administer medications as prescribed as evidenced by self report/primary caregiver report  Patient will attend all scheduled provider appointments as evidenced by clinician review of documented attendance to scheduled appointments and patient/caregiver report Patient will call provider office for new concerns or questions as evidenced by review of documented incoming telephone call notes and patient report call office if I gain more than 2 pounds in one day or 5 pounds in one week keep legs up while sitting track weight in diary use salt in moderation weigh myself daily follow rescue plan if symptoms flare-up track symptoms and what helps feel better or worse - identify and remove indoor air pollutants - limit outdoor activity during cold weather - eliminate symptom triggers at home - follow rescue plan if symptoms flare-up Contact provider for increase use of rescue inhaler/nebulizer (use rescue inhaler and nebulizer for increase in shortness of breath) Call LVAD team for increase in shortness of breath on day 2 instead of waiting days later when it is worse Patient is to contact PCP for post hosp appointment as soon as possible since cardiology appointment rescheduled Patient to contact [facility]  if no communication by Saturday       Plan:The care management team will reach out to the patient again over the next 14 days.  Hubert Azure RN, MSN RN Care Management Coordinator [place] [phone] [email or web address]

## 2021-09-21 NOTE — Chronic Care Management (AMB) (Signed)
Chronic Care Management   CCM RN Visit Note  09/21/2021 Name: Ashley [name] MRN: [id number] DOB: 1951/10/13  Subjective: Ashley [name] is a 70 y.o. year old female who is a primary care patient of [name], MD. The care management team was consulted for assistance with disease management and care coordination needs.    Engaged with patient by telephone for follow up visit in response to provider referral for case management and/or care coordination services.   Consent to Services:  The patient was given information about Chronic Care Management services, agreed to services, and gave verbal consent prior to initiation of services.  Please see initial visit note for detailed documentation.   Patient agreed to services and verbal consent obtained.   Assessment: Review of patient past medical history, allergies, medications, health status, including review of consultants reports, laboratory and other test data, was performed as part of comprehensive evaluation and provision of chronic care management services.   SDOH (Social Determinants of Health) assessments and interventions performed:    CCM Care Plan  Allergies  Allergen Reactions   Oxycodone     Other reaction(s): Hallucination Hallucinations to oxycodone noted while inpatient 06/2021. Tolerates hydromorphone.    Vancomycin Nausea And Vomiting and Palpitations   Hydrocodone-Acetaminophen Nausea Only    Outpatient Encounter Medications as of 09/19/2021  Medication Sig Note   acetaminophen (TYLENOL) 325 MG tablet Take 650 mg by mouth every 6 (six) hours as needed.    albuterol (PROAIR HFA) 108 (90 Base) MCG/ACT inhaler Inhale 1-2 puffs into the lungs every 6 (six) hours as needed for wheezing or shortness of breath.    allopurinol (ZYLOPRIM) 100 MG tablet TAKE ONE TABLET BY MOUTH ONCE DAILY    ALPRAZolam (XANAX) 0.5 MG tablet TAKE 1/2 TO 1 TABLET BY MOUTH AT BEDTIME AS NEEDED FOR ANXIETY    amitriptyline (ELAVIL)  25 MG tablet Take 1 tablet (25 mg total) by mouth at bedtime.    amLODipine (NORVASC) 2.5 MG tablet Take 2.5 mg by mouth daily.    atorvastatin (LIPITOR) 40 MG tablet TAKE ONE TABLET BY MOUTH ONCE DAILY    carvedilol (COREG) 25 MG tablet Take 25 mg by mouth 2 (two) times daily.    colchicine 0.6 MG tablet  01/03/2021: Reports taking only when gout flares up   cyanocobalamin (,VITAMIN B-12,) 1000 MCG/ML injection Inject 1 mL into the muscle monthly (Patient taking differently: Inject 1,000 mcg into the muscle every 30 (thirty) days.)    escitalopram (LEXAPRO) 10 MG tablet TAKE ONE TABLET BY MOUTH ONCE DAILY    ferrous sulfate 325 (65 FE) MG tablet Take 325 mg by mouth daily with breakfast.    fluticasone (FLONASE) 50 MCG/ACT nasal spray Place 1 spray into both nostrils as needed for allergies or rhinitis.    folic acid (FOLVITE) 1 MG tablet TAKE ONE TABLET BY MOUTH ONCE DAILY    GNP MUCUS ER 600 MG 12 hr tablet Take 600 mg by mouth 2 (two) times daily. (Patient not taking: No sig reported) 01/03/2021: Reports taking as needed   HYDROmorphone (DILAUDID) 2 MG tablet Take 1 tablet (2 mg total) by mouth every 6 (six) hours as needed. FOR CHRONIC LOW BACK PAIN    ipratropium-albuterol (DUONEB) 0.5-2.5 (3) MG/3ML SOLN USE 1 VIAL VIA NEBULIZER AND INHALE BY MOUTH INTO THE LUNGS EVERY 6 HOURS AS NEEDED    levothyroxine (SYNTHROID) 75 MCG tablet Take 1 tablet (75 mcg total) by mouth daily.    lidocaine (LIDODERM)  5 % Place 1 patch onto the skin daily. Remove & Discard patch within 12 hours or as directed by MD    magnesium oxide (MAG-OX) 400 (241.3 Mg) MG tablet Take 1 tablet by mouth every Monday, Wednesday, and Friday.    mupirocin ointment (BACTROBAN) 2 % Apply topically daily.    omeprazole (PRILOSEC) 40 MG capsule TAKE ONE CAPSULE BY MOUTH ONCE DAILY (Patient taking differently: Take 40 mg by mouth daily.) 01/03/2021: Reports taking daily   potassium chloride (KLOR-CON) 10 MEQ tablet Take 10 mEq by mouth  every Monday, Wednesday, and Friday. Take 2 tablets every Monday Wednesday Friday 08/25/2021: PRN with fluid pill   sacubitril-valsartan (ENTRESTO) 97-103 MG Take 1 tablet by mouth 2 (two) times daily. (Patient not taking: Reported on 08/25/2021)    senna-docusate (SENOKOT-S) 8.6-50 MG tablet Take by mouth.    spironolactone (ALDACTONE) 25 MG tablet Take 25 mg by mouth daily.    torsemide (DEMADEX) 20 MG tablet Take 20 mg by mouth daily. 08/25/2021: PRN   traZODone (DESYREL) 50 MG tablet SMARTSIG:0.5-1 Tablet(s) By Mouth Every Evening    TRELEGY ELLIPTA 100-62.5-25 MCG/INH AEPB INHALE 1 PUFF BY MOUTH INTO THE LUNGS EVERY DAY    warfarin (COUMADIN) 2 MG tablet Take 6 mg by mouth at bedtime. Every day except on Thursday take 8mg .    [DISCONTINUED] tiZANidine (ZANAFLEX) 2 MG tablet TAKE ONE TABLET BY MOUTH EVERY 6 HOURS AS NEEDED FOR MUSCLE SPASM    No facility-administered encounter medications on file as of 09/19/2021.    Patient Active Problem List   Diagnosis Date Noted   Low back pain without sciatica 08/25/2021   Osteoporosis with current pathological fracture 08/25/2021   Muscle strain of left upper back 07/22/2021   Sequela, post-stroke 07/22/2021   S/P split thickness skin graft 07/11/2021   Hematoma 24/07/7352   Embolic stroke ([place]) 29/92/4268   Brachial artery occlusion, left ([place]) 06/02/2021   History of embolectomy 06/02/2021   History of fasciotomy 06/02/2021   Left arm pain 06/02/2021   Malignant neoplasm of colon, unspecified part of colon ([place]) 05/06/2021   Neuropathy 05/06/2021   PAF (paroxysmal atrial fibrillation) ([place]) 02/10/2021   Palpitation 02/10/2021   Red blood cell antibody positive 12/27/2020   Sternal wound infection 12/23/2020   Anticoagulation goal of INR 2 to 3 12/23/2020   LVAD (left ventricular assist device) present ([place]) 34/19/6222   Complication involving left ventricular assist device (LVAD) 12/23/2020   Hypothyroidism 12/23/2020   History of  gastrointestinal bleeding 10/18/2020   Left ventricular assist device (LVAD) complication, initial encounter 09/23/2020   Infection following procedure 09/03/2020   Chronic anticoagulation 08/03/2020   S/P MVR (mitral valve replacement) 07/16/2020   PHT (pulmonary hypertension) ([place]) 07/07/2020   Snoring 07/05/2020   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 ([place]) 07/05/2020   Ischemic cardiomyopathy 01/02/2020   CKD (chronic kidney disease), stage IIIa 12/31/2019   Pulmonary edema cardiac cause ([employer]) 12/25/2019   Hyperlipidemia    Gouty arthritis of both feet 11/20/2019   Mitral stenosis with insufficiency    Hypertensive urgency    Depression    Hyperglycemia 09/22/2019   Acute respiratory failure with hypoxia ([place]) 09/22/2019   Presence of permanent cardiac pacemaker    Acute blood loss anemia    Chronic kidney disease    Hypertension    CHF (congestive heart failure) ([place]) 07/16/2019   Insomnia 05/07/2019   Suspected COVID-19 virus infection 03/23/2019   AVM (arteriovenous malformation) of  small bowel, acquired    Anemia, iron deficiency 11/10/2018   Rectal polyp    Benign neoplasm of cecum    Barrett's esophagus without dysplasia    Chronic diastolic heart failure ([place]) 07/03/2018   HTN (hypertension) 07/03/2018   COPD with emphysema ([place]) 06/28/2018   B12 deficiency anemia 06/28/2018   Personal history of colon cancer    Polyp of sigmoid colon    Diverticulosis of large intestine without diverticulitis    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   Renal artery stenosis ([place]) 10/03/2016   Encounter for assessment of implantable cardioverter-defibrillator (ICD) 05/06/2015   Failure of implantable cardioverter-defibrillator (ICD) lead 02/09/2015   Hospital discharge follow-up 02/09/2015   Cardiac resynchronization therapy defibrillator (CRT-D) in place 01/09/2015   Peripheral artery disease ([place]) 01/05/2015   Tobacco abuse 10/11/2014   S/P CABG x 3  07/03/2014   MI (myocardial infarction) ([place]) 07/03/2014   Atherosclerosis of native artery of extremity with intermittent claudication ([place]) 06/18/2013   Preoperative evaluation to rule out surgical contraindication 06/18/2013   CAD (coronary artery disease) 06/01/2013   GERD (gastroesophageal reflux disease) 06/01/2013   Hypercholesterolemia 06/01/2013   Tubular adenoma of colon 06/01/2013   Major depressive disorder in remission ([place]) 06/01/2013    Conditions to be addressed/monitored:CHF, COPD, and Anemia and Spinal fractures    Care Plan : RNCM   General Plan of Care (Adult)  Updates made by Leona Singleton, RN since 09/21/2021 12:00 AM   Problem: Knowledge defucuet related to self care managment of chronic  medical conditions   Priority: High     Long-Range Goal: Patient Clyde Lundborg CCM staff to learn self care skills to manage x   Start Date: 09/19/2021  Expected End Date: 09/19/2022  Priority: High  Note:   Current Barriers:  Knowledge Deficits related to plan of care for management of CHF, COPD, and Anemia/GI Bleed, and Spinal Fractures   Care Coordination needs related to Limited social support and Level of care concerns  Patient with recent discharge on 11/11 from [facility] related to anemia/GI bleed.  Received several blood transfusions; unsure of definite source of anemia/? GI bleed.  Was recovering from recent hospitalization in July/August for arm thrombectomy/fasciotomy.  Patient reporting wounds/incisions are healing well.  Diagnosed with spinal fracture post last hospitalization right before this hospitalization.  Patient is still in intense amount of back pain, limiting her ambulation making her have to use wheelchair to get around.  Since discharge patient stating she is feeling weak and tired.  Weight was 147 pounds.  Have not been able to check BP yet.  States she has had to use her rescue nebulizer about 3 times in the past 4 days (more than usual for patient).   Encouraged patient to contact provider is still with increased need of nebulizer.  Denies any swelling.  Home Health ordered at discharge but has not made contact with patient as of yet.  No LVAD alarms.  States she has checked her INR and sent to Cardiology.  Has post hosp appointment with Cardiology on 09/22/21 (encouraged to keep and attend)  RNCM Clinical Goal(s):  Patient will verbalize understanding of plan for management of CHF, COPD, and Anemia and Spinal fractures as evidenced by daily weight monitoring, pain management, COPD/Heart failure action plan, monitoring INR levels as ordered verbalize basic understanding of CHF, COPD, and Anemia and Spinal Fractures disease process and self health management plan as evidenced by working with therapy, increasing activity as tolerated, following action plans demonstrate improved  health management independence as evidenced by close communication with providers, making sure INR meter stays in working order; knowing when to call provider based on weight and shortness of breath, LVAD monitoring/alert monitoring, monitoring self for signs and symptoms of anemia        not experience hospital admission as evidenced by review of EMR. Hospital Admissions in last 6 months = 2  through collaboration with RN Care manager, provider, and care team.   Interventions: 1:1 collaboration with primary care provider regarding development and update of comprehensive plan of care as evidenced by provider attestation and co-signature Inter-disciplinary care team collaboration (see longitudinal plan of care) Evaluation of current treatment plan related to  self management and patient's adherence to plan as established by provider   Anemia/Bleeding:  (Status: Goal on track: NO.) Short Term Goal  Assessment of understanding of anemia/bleeding disorder diagnosis Basic overview and discussion of anemia/bleeding disorder or acute disease state Medications reviewed - Counseled  on bleeding risk associated with coumadin therapy and importance of self-monitoring for signs/symptoms of bleeding; - Counseled on avoidance of NSAIDs due to increased bleeding risk; - Counseled on importance of regular laboratory monitoring as directed by provider; - Provided education about signs and symptoms of active bleeding such as stomach discomfort, coughing up blood or blood tinged secretions, bleeding from the gums/teeth, nosebleeds, increased bruising, blood in the urine/stool and/or if a traumatic injury occurs, regardless of severity of injury;  - Advised to call provider or 911 if active bleeding or signs and symptoms of active bleeding occur; - recommended promotion of rest and energy-conserving measures to manage fatigue, such as balancing activity with periods of rest - encouraged strategies to prevent falls related to fatigue, weakness and dizziness; encouraged sitting before standing and using an assistive device - encouraged optimal oral intake to support fluid balance and nutrition Continue to monitor yourself for low hemoglobin (feeling tired, exhausted, pale, shortness of breath)--SEEK MEDICAL ATTENTION RIGHT AWAY Discussed importance of msking sure INR machine stays in working order  Heart Failure Interventions:  (Status: Goal on Track (progressing): YES.)  Long Term Goal  Basic overview and discussion of pathophysiology of Heart Failure reviewed Provided education on low sodium diet Reviewed Heart Failure Action Plan in depth and provided written copy Advised patient to weigh each morning after emptying bladder Discussed importance of daily weight and advised patient to weigh and record daily Discussed the importance of keeping all appointments with provider  COPD: (Status: Goal on track: NO.) Long Term Goal  Reviewed medications with patient, including use of prescribed maintenance and rescue inhalers, and provided instruction on medication management and the importance  of adherence Provided patient with basic written and verbal COPD education on self care/management/and exacerbation prevention Advised patient to track and manage COPD triggers Provided instruction about proper use of medications used for management of COPD including inhalers Advised patient to self assesses COPD action plan zone and make appointment with provider if in the yellow zone for 48 hours without improvement Discussed the importance of adequate rest and management of fatigue with COPD  Pain:  (Status: New goal.) Short Term Goal  Pain assessment performed Medications reviewed Reviewed provider established plan for pain management; Discussed importance of adherence to all scheduled medical appointments; Counseled on the importance of reporting any/all new or changed pain symptoms or management strategies to pain management provider; Advised patient to report to care team affect of pain on daily activities; Discussed use of relaxation techniques and/or diversional activities to assist  with pain reduction (distraction, imagery, relaxation, massage, acupressure, TENS, heat, and cold application; Reviewed with patient prescribed pharmacological and nonpharmacological pain relief strategies; Advised patient to discuss continued, mobility limiting pain and increase shortness of breath  with provider;  Patient Goals/Self-Care Activities: Patient will self administer medications as prescribed as evidenced by self report/primary caregiver report  Patient will attend all scheduled provider appointments as evidenced by clinician review of documented attendance to scheduled appointments and patient/caregiver report Patient will call provider office for new concerns or questions as evidenced by review of documented incoming telephone call notes and patient report call office if I gain more than 2 pounds in one day or 5 pounds in one week keep legs up while sitting track weight in diary use salt  in moderation weigh myself daily follow rescue plan if symptoms flare-up track symptoms and what helps feel better or worse - identify and remove indoor air pollutants - limit outdoor activity during cold weather - eliminate symptom triggers at home - follow rescue plan if symptoms flare-up Contact provider for increase use of rescue inhaler/nebulizer (use rescue inhaler and nebulizer for increase in shortness of breath) Call LVAD team for increase in shortness of breath on day 2 instead of waiting days later when it is worse       Plan:The care management team will reach out to the patient again over the next 14 days.  Hubert Azure RN, MSN RN Care Management Coordinator [place] [phone] [email or web address]

## 2021-09-21 NOTE — Patient Instructions (Signed)
Visit Information  Patient will self administer medications as prescribed as evidenced by self report/primary caregiver report  Patient will attend all scheduled provider appointments as evidenced by clinician review of documented attendance to scheduled appointments and patient/caregiver report Patient will call provider office for new concerns or questions as evidenced by review of documented incoming telephone call notes and patient report call office if I gain more than 2 pounds in one day or 5 pounds in one week keep legs up while sitting track weight in diary use salt in moderation weigh myself daily follow rescue plan if symptoms flare-up track symptoms and what helps feel better or worse - identify and remove indoor air pollutants - limit outdoor activity during cold weather - eliminate symptom triggers at home - follow rescue plan if symptoms flare-up Contact provider for increase use of rescue inhaler/nebulizer (use rescue inhaler and nebulizer for increase in shortness of breath) Call LVAD team for increase in shortness of breath on day 2 instead of waiting days later when it is worse   Patient verbalizes understanding of instructions provided today and agrees to view in [place].   The care management team will reach out to the patient again over the next 14 days.   Hubert Azure RN, MSN RN Care Management Coordinator [place] [phone] [email or web address]

## 2021-09-21 NOTE — Patient Instructions (Signed)
Visit Information  Patient will self administer medications as prescribed as evidenced by self report/primary caregiver report  Patient will attend all scheduled provider appointments as evidenced by clinician review of documented attendance to scheduled appointments and patient/caregiver report Patient will call provider office for new concerns or questions as evidenced by review of documented incoming telephone call notes and patient report call office if I gain more than 2 pounds in one day or 5 pounds in one week keep legs up while sitting track weight in diary use salt in moderation weigh myself daily follow rescue plan if symptoms flare-up track symptoms and what helps feel better or worse - identify and remove indoor air pollutants - limit outdoor activity during cold weather - eliminate symptom triggers at home - follow rescue plan if symptoms flare-up Contact provider for increase use of rescue inhaler/nebulizer (use rescue inhaler and nebulizer for increase in shortness of breath) Call LVAD team for increase in shortness of breath on day 2 instead of waiting days later when it is worse Patient is to contact PCP for post hosp appointment as soon as possible since cardiology appointment rescheduled Patient to contact [facility]  if no communication by Saturday  Patient verbalizes understanding of instructions provided today and agrees to view in [place].   The care management team will reach out to the patient again over the next 14 days.   Hubert Azure RN, MSN RN Care Management Coordinator [place] [phone] [email or web address]

## 2021-09-26 ENCOUNTER — Ambulatory Visit: Payer: Medicare Other | Admitting: *Deleted

## 2021-09-26 DIAGNOSIS — Z7901 Long term (current) use of anticoagulants: Secondary | ICD-10-CM

## 2021-09-26 DIAGNOSIS — M545 Low back pain, unspecified: Secondary | ICD-10-CM

## 2021-09-26 DIAGNOSIS — I5032 Chronic diastolic (congestive) heart failure: Secondary | ICD-10-CM

## 2021-09-26 DIAGNOSIS — I255 Ischemic cardiomyopathy: Secondary | ICD-10-CM

## 2021-09-26 DIAGNOSIS — Z8719 Personal history of other diseases of the digestive system: Secondary | ICD-10-CM

## 2021-09-26 DIAGNOSIS — J439 Emphysema, unspecified: Secondary | ICD-10-CM

## 2021-10-02 DIAGNOSIS — I10 Essential (primary) hypertension: Secondary | ICD-10-CM | POA: Diagnosis not present

## 2021-10-02 DIAGNOSIS — I272 Pulmonary hypertension, unspecified: Secondary | ICD-10-CM | POA: Diagnosis not present

## 2021-10-02 DIAGNOSIS — I5022 Chronic systolic (congestive) heart failure: Secondary | ICD-10-CM | POA: Diagnosis not present

## 2021-10-02 DIAGNOSIS — R0689 Other abnormalities of breathing: Secondary | ICD-10-CM | POA: Diagnosis not present

## 2021-10-02 DIAGNOSIS — K219 Gastro-esophageal reflux disease without esophagitis: Secondary | ICD-10-CM | POA: Diagnosis not present

## 2021-10-02 DIAGNOSIS — J90 Pleural effusion, not elsewhere classified: Secondary | ICD-10-CM | POA: Diagnosis not present

## 2021-10-02 DIAGNOSIS — N183 Chronic kidney disease, stage 3 unspecified: Secondary | ICD-10-CM | POA: Diagnosis not present

## 2021-10-02 DIAGNOSIS — I700 Atherosclerosis of aorta: Secondary | ICD-10-CM | POA: Diagnosis not present

## 2021-10-02 DIAGNOSIS — I252 Old myocardial infarction: Secondary | ICD-10-CM | POA: Diagnosis not present

## 2021-10-02 DIAGNOSIS — I501 Left ventricular failure: Secondary | ICD-10-CM | POA: Diagnosis not present

## 2021-10-02 DIAGNOSIS — E785 Hyperlipidemia, unspecified: Secondary | ICD-10-CM | POA: Diagnosis not present

## 2021-10-02 DIAGNOSIS — Z951 Presence of aortocoronary bypass graft: Secondary | ICD-10-CM | POA: Diagnosis not present

## 2021-10-02 DIAGNOSIS — B3731 Acute candidiasis of vulva and vagina: Secondary | ICD-10-CM | POA: Diagnosis not present

## 2021-10-02 DIAGNOSIS — I255 Ischemic cardiomyopathy: Secondary | ICD-10-CM | POA: Diagnosis not present

## 2021-10-02 DIAGNOSIS — I499 Cardiac arrhythmia, unspecified: Secondary | ICD-10-CM | POA: Diagnosis not present

## 2021-10-02 DIAGNOSIS — R231 Pallor: Secondary | ICD-10-CM | POA: Diagnosis not present

## 2021-10-02 DIAGNOSIS — Z95810 Presence of automatic (implantable) cardiac defibrillator: Secondary | ICD-10-CM | POA: Diagnosis not present

## 2021-10-02 DIAGNOSIS — R918 Other nonspecific abnormal finding of lung field: Secondary | ICD-10-CM | POA: Diagnosis not present

## 2021-10-02 DIAGNOSIS — Z953 Presence of xenogenic heart valve: Secondary | ICD-10-CM | POA: Diagnosis not present

## 2021-10-02 DIAGNOSIS — I493 Ventricular premature depolarization: Secondary | ICD-10-CM | POA: Diagnosis not present

## 2021-10-02 DIAGNOSIS — E039 Hypothyroidism, unspecified: Secondary | ICD-10-CM | POA: Diagnosis not present

## 2021-10-02 DIAGNOSIS — G8929 Other chronic pain: Secondary | ICD-10-CM | POA: Diagnosis not present

## 2021-10-02 DIAGNOSIS — I2510 Atherosclerotic heart disease of native coronary artery without angina pectoris: Secondary | ICD-10-CM | POA: Diagnosis not present

## 2021-10-02 DIAGNOSIS — J811 Chronic pulmonary edema: Secondary | ICD-10-CM | POA: Diagnosis not present

## 2021-10-02 DIAGNOSIS — Z743 Need for continuous supervision: Secondary | ICD-10-CM | POA: Diagnosis not present

## 2021-10-02 DIAGNOSIS — R0602 Shortness of breath: Secondary | ICD-10-CM | POA: Diagnosis not present

## 2021-10-02 DIAGNOSIS — J440 Chronic obstructive pulmonary disease with acute lower respiratory infection: Secondary | ICD-10-CM | POA: Diagnosis not present

## 2021-10-02 DIAGNOSIS — I130 Hypertensive heart and chronic kidney disease with heart failure and stage 1 through stage 4 chronic kidney disease, or unspecified chronic kidney disease: Secondary | ICD-10-CM | POA: Diagnosis not present

## 2021-10-02 DIAGNOSIS — J9601 Acute respiratory failure with hypoxia: Secondary | ICD-10-CM | POA: Diagnosis not present

## 2021-10-02 DIAGNOSIS — N179 Acute kidney failure, unspecified: Secondary | ICD-10-CM | POA: Diagnosis not present

## 2021-10-02 DIAGNOSIS — Z4682 Encounter for fitting and adjustment of non-vascular catheter: Secondary | ICD-10-CM | POA: Diagnosis not present

## 2021-10-02 DIAGNOSIS — J439 Emphysema, unspecified: Secondary | ICD-10-CM | POA: Diagnosis not present

## 2021-10-02 DIAGNOSIS — Z95811 Presence of heart assist device: Secondary | ICD-10-CM | POA: Diagnosis not present

## 2021-10-02 DIAGNOSIS — D649 Anemia, unspecified: Secondary | ICD-10-CM | POA: Diagnosis not present

## 2021-10-02 DIAGNOSIS — I5023 Acute on chronic systolic (congestive) heart failure: Secondary | ICD-10-CM | POA: Diagnosis not present

## 2021-10-02 DIAGNOSIS — J9811 Atelectasis: Secondary | ICD-10-CM | POA: Diagnosis not present

## 2021-10-02 DIAGNOSIS — Z20822 Contact with and (suspected) exposure to covid-19: Secondary | ICD-10-CM | POA: Diagnosis not present

## 2021-10-02 DIAGNOSIS — I50810 Right heart failure, unspecified: Secondary | ICD-10-CM | POA: Diagnosis not present

## 2021-10-02 DIAGNOSIS — D62 Acute posthemorrhagic anemia: Secondary | ICD-10-CM | POA: Diagnosis not present

## 2021-10-02 DIAGNOSIS — E1140 Type 2 diabetes mellitus with diabetic neuropathy, unspecified: Secondary | ICD-10-CM | POA: Diagnosis not present

## 2021-10-02 DIAGNOSIS — M25551 Pain in right hip: Secondary | ICD-10-CM | POA: Diagnosis not present

## 2021-10-02 DIAGNOSIS — E1122 Type 2 diabetes mellitus with diabetic chronic kidney disease: Secondary | ICD-10-CM | POA: Diagnosis not present

## 2021-10-02 DIAGNOSIS — E1151 Type 2 diabetes mellitus with diabetic peripheral angiopathy without gangrene: Secondary | ICD-10-CM | POA: Diagnosis not present

## 2021-10-02 NOTE — Chronic Care Management (AMB) (Signed)
Chronic Care Management   CCM RN Visit Note  10/02/2021 Name: Ashley [name] MRN: [id number] DOB: 1951-03-13  Subjective: Ashley [name] is a 70 y.o. year old female who is a primary care patient of [name], MD. The care management team was consulted for assistance with disease management and care coordination needs.    Engaged with patient by telephone for follow up visit in response to provider referral for case management and/or care coordination services.   Consent to Services:  The patient was given information about Chronic Care Management services, agreed to services, and gave verbal consent prior to initiation of services.  Please see initial visit note for detailed documentation.   Patient agreed to services and verbal consent obtained.   Assessment: Review of patient past medical history, allergies, medications, health status, including review of consultants reports, laboratory and other test data, was performed as part of comprehensive evaluation and provision of chronic care management services.   SDOH (Social Determinants of Health) assessments and interventions performed:    CCM Care Plan  Allergies  Allergen Reactions   Oxycodone     Other reaction(s): Hallucination Hallucinations to oxycodone noted while inpatient 06/2021. Tolerates hydromorphone.    Vancomycin Nausea And Vomiting and Palpitations   Hydrocodone-Acetaminophen Nausea Only    Outpatient Encounter Medications as of 09/26/2021  Medication Sig Note   acetaminophen (TYLENOL) 325 MG tablet Take 650 mg by mouth every 6 (six) hours as needed.    albuterol (PROAIR HFA) 108 (90 Base) MCG/ACT inhaler Inhale 1-2 puffs into the lungs every 6 (six) hours as needed for wheezing or shortness of breath.    allopurinol (ZYLOPRIM) 100 MG tablet TAKE ONE TABLET BY MOUTH ONCE DAILY    ALPRAZolam (XANAX) 0.5 MG tablet TAKE 1/2 TO 1 TABLET BY MOUTH AT BEDTIME AS NEEDED FOR ANXIETY    amitriptyline (ELAVIL)  25 MG tablet Take 1 tablet (25 mg total) by mouth at bedtime.    amLODipine (NORVASC) 2.5 MG tablet Take 2.5 mg by mouth daily.    atorvastatin (LIPITOR) 40 MG tablet TAKE ONE TABLET BY MOUTH ONCE DAILY    carvedilol (COREG) 25 MG tablet Take 25 mg by mouth 2 (two) times daily.    colchicine 0.6 MG tablet  01/03/2021: Reports taking only when gout flares up   cyanocobalamin (,VITAMIN B-12,) 1000 MCG/ML injection Inject 1 mL into the muscle monthly (Patient taking differently: Inject 1,000 mcg into the muscle every 30 (thirty) days.)    escitalopram (LEXAPRO) 10 MG tablet TAKE ONE TABLET BY MOUTH ONCE DAILY    ferrous sulfate 325 (65 FE) MG tablet Take 325 mg by mouth daily with breakfast.    fluticasone (FLONASE) 50 MCG/ACT nasal spray Place 1 spray into both nostrils as needed for allergies or rhinitis.    folic acid (FOLVITE) 1 MG tablet TAKE ONE TABLET BY MOUTH ONCE DAILY    GNP MUCUS ER 600 MG 12 hr tablet Take 600 mg by mouth 2 (two) times daily. (Patient not taking: No sig reported) 01/03/2021: Reports taking as needed   HYDROmorphone (DILAUDID) 2 MG tablet Take 1 tablet (2 mg total) by mouth every 6 (six) hours as needed. FOR CHRONIC LOW BACK PAIN    ipratropium-albuterol (DUONEB) 0.5-2.5 (3) MG/3ML SOLN USE 1 VIAL VIA NEBULIZER AND INHALE BY MOUTH INTO THE LUNGS EVERY 6 HOURS AS NEEDED    levothyroxine (SYNTHROID) 75 MCG tablet Take 1 tablet (75 mcg total) by mouth daily.    lidocaine (LIDODERM)  5 % Place 1 patch onto the skin daily. Remove & Discard patch within 12 hours or as directed by MD    magnesium oxide (MAG-OX) 400 (241.3 Mg) MG tablet Take 1 tablet by mouth every Monday, Wednesday, and Friday.    mupirocin ointment (BACTROBAN) 2 % Apply topically daily.    omeprazole (PRILOSEC) 40 MG capsule TAKE ONE CAPSULE BY MOUTH ONCE DAILY (Patient taking differently: Take 40 mg by mouth daily.) 01/03/2021: Reports taking daily   potassium chloride (KLOR-CON) 10 MEQ tablet Take 10 mEq by mouth  every Monday, Wednesday, and Friday. Take 2 tablets every Monday Wednesday Friday 08/25/2021: PRN with fluid pill   sacubitril-valsartan (ENTRESTO) 97-103 MG Take 1 tablet by mouth 2 (two) times daily. (Patient not taking: Reported on 08/25/2021)    senna-docusate (SENOKOT-S) 8.6-50 MG tablet Take by mouth.    spironolactone (ALDACTONE) 25 MG tablet Take 25 mg by mouth daily.    tiZANidine (ZANAFLEX) 2 MG tablet TAKE ONE TABLET BY MOUTH EVERY 6 HOURS AS NEEDED FOR MUSCLE SPASM    torsemide (DEMADEX) 20 MG tablet Take 20 mg by mouth daily. 08/25/2021: PRN   traZODone (DESYREL) 50 MG tablet SMARTSIG:0.5-1 Tablet(s) By Mouth Every Evening    TRELEGY ELLIPTA 100-62.5-25 MCG/INH AEPB INHALE 1 PUFF BY MOUTH INTO THE LUNGS EVERY DAY    warfarin (COUMADIN) 2 MG tablet Take 6 mg by mouth at bedtime. Every day except on Thursday take 8mg .    No facility-administered encounter medications on file as of 09/26/2021.    Patient Active Problem List   Diagnosis Date Noted   Low back pain without sciatica 08/25/2021   Osteoporosis with current pathological fracture 08/25/2021   Muscle strain of left upper back 07/22/2021   Sequela, post-stroke 07/22/2021   S/P split thickness skin graft 07/11/2021   Hematoma 73/53/2992   Embolic stroke ([place]) 42/68/3419   Brachial artery occlusion, left ([place]) 06/02/2021   History of embolectomy 06/02/2021   History of fasciotomy 06/02/2021   Left arm pain 06/02/2021   Malignant neoplasm of colon, unspecified part of colon ([place]) 05/06/2021   Neuropathy 05/06/2021   PAF (paroxysmal atrial fibrillation) ([place]) 02/10/2021   Palpitation 02/10/2021   Red blood cell antibody positive 12/27/2020   Sternal wound infection 12/23/2020   Anticoagulation goal of INR 2 to 3 12/23/2020   LVAD (left ventricular assist device) present ([place]) 62/22/9798   Complication involving left ventricular assist device (LVAD) 12/23/2020   Hypothyroidism 12/23/2020   History of gastrointestinal  bleeding 10/18/2020   Left ventricular assist device (LVAD) complication, initial encounter 09/23/2020   Infection following procedure 09/03/2020   Chronic anticoagulation 08/03/2020   S/P MVR (mitral valve replacement) 07/16/2020   PHT (pulmonary hypertension) ([place]) 07/07/2020   Snoring 07/05/2020   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 ([place]) 07/05/2020   Ischemic cardiomyopathy 01/02/2020   CKD (chronic kidney disease), stage IIIa 12/31/2019   Pulmonary edema cardiac cause ([place]) 12/25/2019   Hyperlipidemia    Gouty arthritis of both feet 11/20/2019   Mitral stenosis with insufficiency    Hypertensive urgency    Depression    Hyperglycemia 09/22/2019   Acute respiratory failure with hypoxia ([place]) 09/22/2019   Presence of permanent cardiac pacemaker    Acute blood loss anemia    Chronic kidney disease    Hypertension    CHF (congestive heart failure) ([place]) 07/16/2019   Insomnia 05/07/2019   Suspected COVID-19 virus infection 03/23/2019   AVM (arteriovenous malformation) of small  bowel, acquired    Anemia, iron deficiency 11/10/2018   Rectal polyp    Benign neoplasm of cecum    Barrett's esophagus without dysplasia    Chronic diastolic heart failure ([place]) 07/03/2018   HTN (hypertension) 07/03/2018   COPD with emphysema ([place]) 06/28/2018   B12 deficiency anemia 06/28/2018   Personal history of colon cancer    Polyp of sigmoid colon    Diverticulosis of large intestine without diverticulitis    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   Renal artery stenosis ([place]) 10/03/2016   Encounter for assessment of implantable cardioverter-defibrillator (ICD) 05/06/2015   Failure of implantable cardioverter-defibrillator (ICD) lead 02/09/2015   Hospital discharge follow-up 02/09/2015   Cardiac resynchronization therapy defibrillator (CRT-D) in place 01/09/2015   Peripheral artery disease ([place]) 01/05/2015   Tobacco abuse 10/11/2014   S/P CABG x 3 07/03/2014   MI  (myocardial infarction) ([place]) 07/03/2014   Atherosclerosis of native artery of extremity with intermittent claudication ([place]) 06/18/2013   Preoperative evaluation to rule out surgical contraindication 06/18/2013   CAD (coronary artery disease) 06/01/2013   GERD (gastroesophageal reflux disease) 06/01/2013   Hypercholesterolemia 06/01/2013   Tubular adenoma of colon 06/01/2013   Major depressive disorder in remission ([place]) 06/01/2013    Conditions to be addressed/monitored:CHF, COPD, and Pain  Care Plan : [place] (Adult)  Updates made by Leona Singleton, RN since 10/02/2021 12:00 AM     Problem: Knowledge defucuet related to self care managment of chronic  medical conditions   Priority: High     Long-Range Goal: Patient will work with CCM staff to learn self care skills to manage x   Start Date: 09/19/2021  Expected End Date: 09/19/2022  This Visit's Progress: Not on track  Recent Progress: Not on track  Priority: High  Note:   Current Barriers:  Knowledge Deficits related to plan of care for management of CHF, COPD, and Anemia/GI Bleed, and Spinal Fractures   Care Coordination needs related to Limited social support and Level of care concerns  Patient with recent discharge on 11/11 from [facility] related to anemia/GI bleed.  Received several blood transfusions; unsure of definite source of anemia/? GI bleed.  Was recovering from recent hospitalization in July/August for arm thrombectomy/fasciotomy.  Patient reporting wounds/incisions are healing well.  Diagnosed with spinal fracture post last hospitalization right before this hospitalization.  Patient is still in intense amount of back pain, limiting her ambulation making her have to use wheelchair to get around.  Since discharge patient stating she is feeling weak and tired.  Weight was 147 pounds.  Have not been able to check BP yet.  States she has had to use her rescue nebulizer about 3 times in the past 4 days (more  than usual for patient).  Encouraged patient to contact provider is still with increased need of nebulizer.  Denies any swelling.  Home Health ordered at discharge but has not made contact with patient as of yet.  No LVAD alarms.  States she has checked her INR and sent to Cardiology.  Has post hosp appointment with Cardiology on 09/22/21 (encouraged to keep and attend)  Continues to be in intense back pain. States pain medicine does help but makes her sleep.  Still using wheelchair to get around.  Has used rescue nebulizer twice since our conversation last Monday (bach to baseline for patient).  Has not gotten up to weigh yet.  Has had home health nursing ou for visit , but was told  she no longer needed home health nursing.  Patient reporting Cardiology has rescheduled post hosp appointment to 11/30/21.  Instructed and encouraged to contact PCP for post hosp follow up appointment.  Encouraged to also speak with PCP concerning severe back pain and limited movement.  Patient also complaining of urinary frequency and urgency  RNCM Clinical Goal(s):  Patient will verbalize understanding of plan for management of CHF, COPD, and Anemia and Spinal fractures as evidenced by daily weight monitoring, pain management, COPD/Heart failure action plan, monitoring INR levels as ordered verbalize basic understanding of CHF, COPD, and Anemia and Spinal Fractures disease process and self health management plan as evidenced by working with therapy, increasing activity as tolerated, following action plans demonstrate improved health management independence as evidenced by close communication with providers, making sure INR meter stays in working order; knowing when to call provider based on weight and shortness of breath, LVAD monitoring/alert monitoring, monitoring self for signs and symptoms of anemia        not experience hospital admission as evidenced by review of EMR. Hospital Admissions in last 6 months = 2  through  collaboration with RN Care manager, provider, and care team.   Interventions: 1:1 collaboration with primary care provider regarding development and update of comprehensive plan of care as evidenced by provider attestation and co-signature Inter-disciplinary care team collaboration (see longitudinal plan of care) Evaluation of current treatment plan related to  self management and patient's adherence to plan as established by provider Provided patient with number to home health [facility] [phone]   Anemia/Bleeding:  (Status: Goal on track: NO.) Short Term Goal  Assessment of understanding of anemia/bleeding disorder diagnosis Basic overview and discussion of anemia/bleeding disorder or acute disease state Medications reviewed - Counseled on bleeding risk associated with coumadin therapy and importance of self-monitoring for signs/symptoms of bleeding; - Counseled on avoidance of NSAIDs due to increased bleeding risk; - Counseled on importance of regular laboratory monitoring as directed by provider; - Provided education about signs and symptoms of active bleeding such as stomach discomfort, coughing up blood or blood tinged secretions, bleeding from the gums/teeth, nosebleeds, increased bruising, blood in the urine/stool and/or if a traumatic injury occurs, regardless of severity of injury;  - Advised to call provider or 911 if active bleeding or signs and symptoms of active bleeding occur; - recommended promotion of rest and energy-conserving measures to manage fatigue, such as balancing activity with periods of rest - encouraged strategies to prevent falls related to fatigue, weakness and dizziness; encouraged sitting before standing and using an assistive device - encouraged optimal oral intake to support fluid balance and nutrition Continue to monitor yourself for low hemoglobin (feeling tired, exhausted, pale, shortness of breath)--SEEK MEDICAL ATTENTION RIGHT AWAY Discussed importance of  making sure INR machine stays in working order  Heart Failure Interventions:  (Status: Goal on Track (progressing): YES.)  Long Term Goal  Basic overview and discussion of pathophysiology of Heart Failure reviewed Provided education on low sodium diet Reviewed Heart Failure Action Plan in depth and provided written copy Advised patient to weigh each morning after emptying bladder Discussed importance of daily weight and advised patient to weigh and record daily Discussed the importance of keeping all appointments with provider  COPD: (Status: Goal on track: NO.) Long Term Goal  Reviewed medications with patient, including use of prescribed maintenance and rescue inhalers, and provided instruction on medication management and the importance of adherence Provided patient with basic written and verbal COPD education on self  care/management/and exacerbation prevention Advised patient to track and manage COPD triggers Provided instruction about proper use of medications used for management of COPD including inhalers Advised patient to self assesses COPD action plan zone and make appointment with provider if in the yellow zone for 48 hours without improvement Discussed the importance of adequate rest and management of fatigue with COPD Encouraged to discuss increase SOB with PCP as soon as possible  Pain:  (Status: Goal on track: NO.) Short Term Goal  Pain assessment performed Medications reviewed Reviewed provider established plan for pain management; Discussed importance of adherence to all scheduled medical appointments; Counseled on the importance of reporting any/all new or changed pain symptoms or management strategies to pain management provider; Advised patient to report to care team affect of pain on daily activities; Discussed use of relaxation techniques and/or diversional activities to assist with pain reduction (distraction, imagery, relaxation, massage, acupressure, TENS, heat, and  cold application; Reviewed with patient prescribed pharmacological and nonpharmacological pain relief strategies; Advised patient to discuss continued, mobility limiting pain and increase shortness of breath  with provider; Discuss adding HHPT/OT with PCP  Patient Goals/Self-Care Activities: Patient will self administer medications as prescribed as evidenced by self report/primary caregiver report  Patient will attend all scheduled provider appointments as evidenced by clinician review of documented attendance to scheduled appointments and patient/caregiver report Patient will call provider office for new concerns or questions as evidenced by review of documented incoming telephone call notes and patient report call office if I gain more than 2 pounds in one day or 5 pounds in one week keep legs up while sitting track weight in diary use salt in moderation weigh myself daily follow rescue plan if symptoms flare-up track symptoms and what helps feel better or worse - identify and remove indoor air pollutants - limit outdoor activity during cold weather - eliminate symptom triggers at home - follow rescue plan if symptoms flare-up Contact provider for increase use of rescue inhaler/nebulizer (use rescue inhaler and nebulizer for increase in shortness of breath) Call LVAD team for increase in shortness of breath on day 2 instead of waiting days later when it is worse Patient is to contact PCP for post hosp appointment as soon as possible since cardiology appointment rescheduled        Plan:The care management team will reach out to the patient again over the next 20 days.  Hubert Azure RN, MSN RN Care Management Coordinator [place] [phone] [email or web address]

## 2021-10-02 NOTE — Patient Instructions (Signed)
Visit Information  Thank you for taking time to visit with me today. Please don't hesitate to contact me if I can be of assistance to you before our next scheduled telephone appointment.  Following are the goals we discussed today:  (Patient will self administer medications as prescribed as evidenced by self report/primary caregiver report  Patient will attend all scheduled provider appointments as evidenced by clinician review of documented attendance to scheduled appointments and patient/caregiver report Patient will call provider office for new concerns or questions as evidenced by review of documented incoming telephone call notes and patient report call office if I gain more than 2 pounds in one day or 5 pounds in one week keep legs up while sitting track weight in diary use salt in moderation weigh myself daily follow rescue plan if symptoms flare-up track symptoms and what helps feel better or worse - identify and remove indoor air pollutants - limit outdoor activity during cold weather - eliminate symptom triggers at home - follow rescue plan if symptoms flare-up  Our next appointment is by telephone on 10/07/21 at 245  Please call the care guide team at [phone] if you need to cancel or reschedule your appointment.   Please call the Suicide and Crisis Lifeline: 988 call the [place] National Suicide Prevention Lifeline: 787-792-4454 or TTY: 814-499-6399 TTY 838-364-9711) to talk to a trained counselor call 1-800-273-TALK (toll free, 24 hour hotline) call the [facility]: ([phone] if you are experiencing a [place] or [place] or need someone to talk to.  Patient verbalizes understanding of instructions provided today and agrees to view in [place].   Ashley Azure RN, MSN RN Care Management Coordinator [place] [phone] [name]

## 2021-10-05 DIAGNOSIS — I5023 Acute on chronic systolic (congestive) heart failure: Secondary | ICD-10-CM

## 2021-10-05 DIAGNOSIS — J439 Emphysema, unspecified: Secondary | ICD-10-CM | POA: Diagnosis not present

## 2021-10-05 DIAGNOSIS — I5032 Chronic diastolic (congestive) heart failure: Secondary | ICD-10-CM

## 2021-10-05 DIAGNOSIS — D62 Acute posthemorrhagic anemia: Secondary | ICD-10-CM | POA: Diagnosis not present

## 2021-10-05 DIAGNOSIS — I5043 Acute on chronic combined systolic (congestive) and diastolic (congestive) heart failure: Secondary | ICD-10-CM

## 2021-10-05 DIAGNOSIS — I255 Ischemic cardiomyopathy: Secondary | ICD-10-CM

## 2021-10-05 DIAGNOSIS — D509 Iron deficiency anemia, unspecified: Secondary | ICD-10-CM

## 2021-10-05 DIAGNOSIS — J441 Chronic obstructive pulmonary disease with (acute) exacerbation: Secondary | ICD-10-CM

## 2021-10-07 ENCOUNTER — Telehealth: Payer: Self-pay | Admitting: *Deleted

## 2021-10-07 ENCOUNTER — Telehealth: Payer: Medicare Other

## 2021-10-07 NOTE — Telephone Encounter (Signed)
  Care Management   Follow Up Note   10/07/2021 Name: Ashley [name] MRN: [id number] DOB: 02/25/1951   Referred by: [name], MD Reason for referral : Chronic Care Management (COPD, CHF, Anemia)   Per chart review  patient still hospitalized at [facility] for shortness of breath.  Will reschedule CCM outreach  Follow Up Plan: The care management team will reach out to the patient again over the next 10 days.   Hubert Azure RN, MSN RN Care Management Coordinator [place] [phone] [email or web address]

## 2021-10-11 ENCOUNTER — Telehealth: Payer: Self-pay

## 2021-10-11 NOTE — Telephone Encounter (Signed)
Patient discharged from [facility] 10/07/21. Declines HFU at this time. Notes she is doing better and following up with Cardiology. Encouraged to call the office as needed for new or worsening symptoms. No transition of care call scheduled at this time.

## 2021-10-12 ENCOUNTER — Telehealth: Payer: Self-pay | Admitting: *Deleted

## 2021-10-12 ENCOUNTER — Telehealth: Payer: Medicare Other

## 2021-10-12 NOTE — Telephone Encounter (Signed)
  Care Management   Follow Up Note   10/12/2021 Name: Ashley [name] MRN: [id number] DOB: 11-Sep-1951   Referred by: [name], MD Reason for referral : Chronic Care Management   An unsuccessful telephone outreach was attempted today. The patient was referred to the case management team for assistance with care management and care coordination.   Follow Up Plan: RNCM will seek assistance from Care Guides in rescheduling appointment within the next 30 days.  Hubert Azure RN, MSN RN Care Management Coordinator [place] [phone] [email or web address]

## 2021-10-13 ENCOUNTER — Other Ambulatory Visit: Payer: Self-pay | Admitting: Internal Medicine

## 2021-10-13 DIAGNOSIS — Z95811 Presence of heart assist device: Secondary | ICD-10-CM | POA: Diagnosis not present

## 2021-10-13 DIAGNOSIS — I10 Essential (primary) hypertension: Secondary | ICD-10-CM | POA: Diagnosis not present

## 2021-10-13 DIAGNOSIS — R0602 Shortness of breath: Secondary | ICD-10-CM | POA: Diagnosis not present

## 2021-10-13 DIAGNOSIS — Z7901 Long term (current) use of anticoagulants: Secondary | ICD-10-CM | POA: Diagnosis not present

## 2021-10-13 DIAGNOSIS — I255 Ischemic cardiomyopathy: Secondary | ICD-10-CM | POA: Diagnosis not present

## 2021-10-13 DIAGNOSIS — I5022 Chronic systolic (congestive) heart failure: Secondary | ICD-10-CM | POA: Diagnosis not present

## 2021-10-25 NOTE — Telephone Encounter (Signed)
Pt has been rescheduled. 

## 2021-10-28 DIAGNOSIS — I428 Other cardiomyopathies: Secondary | ICD-10-CM | POA: Diagnosis not present

## 2021-10-28 DIAGNOSIS — Z4801 Encounter for change or removal of surgical wound dressing: Secondary | ICD-10-CM | POA: Diagnosis not present

## 2021-10-28 DIAGNOSIS — Z48812 Encounter for surgical aftercare following surgery on the circulatory system: Secondary | ICD-10-CM | POA: Diagnosis not present

## 2021-10-28 DIAGNOSIS — Z95811 Presence of heart assist device: Secondary | ICD-10-CM | POA: Diagnosis not present

## 2021-10-29 DIAGNOSIS — Z952 Presence of prosthetic heart valve: Secondary | ICD-10-CM | POA: Diagnosis not present

## 2021-10-30 DIAGNOSIS — I340 Nonrheumatic mitral (valve) insufficiency: Secondary | ICD-10-CM | POA: Diagnosis not present

## 2021-10-30 DIAGNOSIS — Z953 Presence of xenogenic heart valve: Secondary | ICD-10-CM | POA: Diagnosis not present

## 2021-10-30 DIAGNOSIS — I5022 Chronic systolic (congestive) heart failure: Secondary | ICD-10-CM | POA: Diagnosis not present

## 2021-10-30 DIAGNOSIS — I080 Rheumatic disorders of both mitral and aortic valves: Secondary | ICD-10-CM | POA: Diagnosis not present

## 2021-10-30 DIAGNOSIS — R42 Dizziness and giddiness: Secondary | ICD-10-CM | POA: Diagnosis not present

## 2021-10-30 DIAGNOSIS — D62 Acute posthemorrhagic anemia: Secondary | ICD-10-CM | POA: Diagnosis not present

## 2021-10-30 DIAGNOSIS — Z20822 Contact with and (suspected) exposure to covid-19: Secondary | ICD-10-CM | POA: Diagnosis not present

## 2021-10-30 DIAGNOSIS — I255 Ischemic cardiomyopathy: Secondary | ICD-10-CM | POA: Diagnosis not present

## 2021-10-30 DIAGNOSIS — J811 Chronic pulmonary edema: Secondary | ICD-10-CM | POA: Diagnosis not present

## 2021-10-30 DIAGNOSIS — R569 Unspecified convulsions: Secondary | ICD-10-CM | POA: Diagnosis not present

## 2021-10-30 DIAGNOSIS — E1151 Type 2 diabetes mellitus with diabetic peripheral angiopathy without gangrene: Secondary | ICD-10-CM | POA: Diagnosis not present

## 2021-10-30 DIAGNOSIS — I6389 Other cerebral infarction: Secondary | ICD-10-CM | POA: Diagnosis not present

## 2021-10-30 DIAGNOSIS — I420 Dilated cardiomyopathy: Secondary | ICD-10-CM | POA: Diagnosis not present

## 2021-10-30 DIAGNOSIS — N183 Chronic kidney disease, stage 3 unspecified: Secondary | ICD-10-CM | POA: Diagnosis not present

## 2021-10-30 DIAGNOSIS — R400 Somnolence: Secondary | ICD-10-CM | POA: Diagnosis not present

## 2021-10-30 DIAGNOSIS — N390 Urinary tract infection, site not specified: Secondary | ICD-10-CM | POA: Diagnosis not present

## 2021-10-30 DIAGNOSIS — G9340 Encephalopathy, unspecified: Secondary | ICD-10-CM | POA: Diagnosis not present

## 2021-10-30 DIAGNOSIS — I130 Hypertensive heart and chronic kidney disease with heart failure and stage 1 through stage 4 chronic kidney disease, or unspecified chronic kidney disease: Secondary | ICD-10-CM | POA: Diagnosis not present

## 2021-10-30 DIAGNOSIS — Z95820 Peripheral vascular angioplasty status with implants and grafts: Secondary | ICD-10-CM | POA: Diagnosis not present

## 2021-10-30 DIAGNOSIS — Z743 Need for continuous supervision: Secondary | ICD-10-CM | POA: Diagnosis not present

## 2021-10-30 DIAGNOSIS — R578 Other shock: Secondary | ICD-10-CM | POA: Diagnosis not present

## 2021-10-30 DIAGNOSIS — D500 Iron deficiency anemia secondary to blood loss (chronic): Secondary | ICD-10-CM | POA: Diagnosis not present

## 2021-10-30 DIAGNOSIS — D6832 Hemorrhagic disorder due to extrinsic circulating anticoagulants: Secondary | ICD-10-CM | POA: Diagnosis not present

## 2021-10-30 DIAGNOSIS — I129 Hypertensive chronic kidney disease with stage 1 through stage 4 chronic kidney disease, or unspecified chronic kidney disease: Secondary | ICD-10-CM | POA: Diagnosis not present

## 2021-10-30 DIAGNOSIS — D649 Anemia, unspecified: Secondary | ICD-10-CM | POA: Diagnosis not present

## 2021-10-30 DIAGNOSIS — E039 Hypothyroidism, unspecified: Secondary | ICD-10-CM | POA: Diagnosis not present

## 2021-10-30 DIAGNOSIS — J441 Chronic obstructive pulmonary disease with (acute) exacerbation: Secondary | ICD-10-CM | POA: Diagnosis not present

## 2021-10-30 DIAGNOSIS — J449 Chronic obstructive pulmonary disease, unspecified: Secondary | ICD-10-CM | POA: Diagnosis not present

## 2021-10-30 DIAGNOSIS — I272 Pulmonary hypertension, unspecified: Secondary | ICD-10-CM | POA: Diagnosis not present

## 2021-10-30 DIAGNOSIS — J9811 Atelectasis: Secondary | ICD-10-CM | POA: Diagnosis not present

## 2021-10-30 DIAGNOSIS — G928 Other toxic encephalopathy: Secondary | ICD-10-CM | POA: Diagnosis not present

## 2021-10-30 DIAGNOSIS — R404 Transient alteration of awareness: Secondary | ICD-10-CM | POA: Diagnosis not present

## 2021-10-30 DIAGNOSIS — E1140 Type 2 diabetes mellitus with diabetic neuropathy, unspecified: Secondary | ICD-10-CM | POA: Diagnosis not present

## 2021-10-30 DIAGNOSIS — Z952 Presence of prosthetic heart valve: Secondary | ICD-10-CM | POA: Diagnosis not present

## 2021-10-30 DIAGNOSIS — E1122 Type 2 diabetes mellitus with diabetic chronic kidney disease: Secondary | ICD-10-CM | POA: Diagnosis not present

## 2021-10-30 DIAGNOSIS — Z7901 Long term (current) use of anticoagulants: Secondary | ICD-10-CM | POA: Diagnosis not present

## 2021-10-30 DIAGNOSIS — B964 Proteus (mirabilis) (morganii) as the cause of diseases classified elsewhere: Secondary | ICD-10-CM | POA: Diagnosis not present

## 2021-10-30 DIAGNOSIS — E785 Hyperlipidemia, unspecified: Secondary | ICD-10-CM | POA: Diagnosis not present

## 2021-10-30 DIAGNOSIS — I639 Cerebral infarction, unspecified: Secondary | ICD-10-CM | POA: Diagnosis not present

## 2021-10-30 DIAGNOSIS — R590 Localized enlarged lymph nodes: Secondary | ICD-10-CM | POA: Diagnosis not present

## 2021-10-30 DIAGNOSIS — I25810 Atherosclerosis of coronary artery bypass graft(s) without angina pectoris: Secondary | ICD-10-CM | POA: Diagnosis not present

## 2021-10-30 DIAGNOSIS — K921 Melena: Secondary | ICD-10-CM | POA: Diagnosis not present

## 2021-10-30 DIAGNOSIS — I2510 Atherosclerotic heart disease of native coronary artery without angina pectoris: Secondary | ICD-10-CM | POA: Diagnosis not present

## 2021-10-30 DIAGNOSIS — I6381 Other cerebral infarction due to occlusion or stenosis of small artery: Secondary | ICD-10-CM | POA: Diagnosis not present

## 2021-10-30 DIAGNOSIS — R110 Nausea: Secondary | ICD-10-CM | POA: Diagnosis not present

## 2021-10-30 DIAGNOSIS — G8918 Other acute postprocedural pain: Secondary | ICD-10-CM | POA: Diagnosis not present

## 2021-10-30 DIAGNOSIS — I951 Orthostatic hypotension: Secondary | ICD-10-CM | POA: Diagnosis not present

## 2021-10-30 DIAGNOSIS — Z95811 Presence of heart assist device: Secondary | ICD-10-CM | POA: Diagnosis not present

## 2021-10-30 DIAGNOSIS — I739 Peripheral vascular disease, unspecified: Secondary | ICD-10-CM | POA: Diagnosis not present

## 2021-10-30 DIAGNOSIS — K922 Gastrointestinal hemorrhage, unspecified: Secondary | ICD-10-CM | POA: Diagnosis not present

## 2021-10-30 DIAGNOSIS — R791 Abnormal coagulation profile: Secondary | ICD-10-CM | POA: Diagnosis not present

## 2021-10-30 DIAGNOSIS — Z4682 Encounter for fitting and adjustment of non-vascular catheter: Secondary | ICD-10-CM | POA: Diagnosis not present

## 2021-10-31 DIAGNOSIS — Z952 Presence of prosthetic heart valve: Secondary | ICD-10-CM | POA: Diagnosis not present

## 2021-11-01 DIAGNOSIS — Z952 Presence of prosthetic heart valve: Secondary | ICD-10-CM | POA: Diagnosis not present

## 2021-11-01 DIAGNOSIS — G9340 Encephalopathy, unspecified: Secondary | ICD-10-CM | POA: Diagnosis not present

## 2021-11-01 DIAGNOSIS — R400 Somnolence: Secondary | ICD-10-CM | POA: Diagnosis not present

## 2021-11-01 DIAGNOSIS — R590 Localized enlarged lymph nodes: Secondary | ICD-10-CM | POA: Diagnosis not present

## 2021-11-01 DIAGNOSIS — I255 Ischemic cardiomyopathy: Secondary | ICD-10-CM | POA: Diagnosis not present

## 2021-11-01 DIAGNOSIS — I6381 Other cerebral infarction due to occlusion or stenosis of small artery: Secondary | ICD-10-CM | POA: Diagnosis not present

## 2021-11-01 DIAGNOSIS — R569 Unspecified convulsions: Secondary | ICD-10-CM | POA: Diagnosis not present

## 2021-11-01 DIAGNOSIS — Z7901 Long term (current) use of anticoagulants: Secondary | ICD-10-CM | POA: Diagnosis not present

## 2021-11-01 DIAGNOSIS — D649 Anemia, unspecified: Secondary | ICD-10-CM | POA: Diagnosis not present

## 2021-11-01 DIAGNOSIS — K922 Gastrointestinal hemorrhage, unspecified: Secondary | ICD-10-CM | POA: Diagnosis not present

## 2021-11-01 DIAGNOSIS — I639 Cerebral infarction, unspecified: Secondary | ICD-10-CM | POA: Diagnosis not present

## 2021-11-02 DIAGNOSIS — I5022 Chronic systolic (congestive) heart failure: Secondary | ICD-10-CM | POA: Diagnosis not present

## 2021-11-02 DIAGNOSIS — Z952 Presence of prosthetic heart valve: Secondary | ICD-10-CM | POA: Diagnosis not present

## 2021-11-02 DIAGNOSIS — R400 Somnolence: Secondary | ICD-10-CM | POA: Diagnosis not present

## 2021-11-02 DIAGNOSIS — R569 Unspecified convulsions: Secondary | ICD-10-CM | POA: Diagnosis not present

## 2021-11-03 DIAGNOSIS — J811 Chronic pulmonary edema: Secondary | ICD-10-CM | POA: Diagnosis not present

## 2021-11-03 DIAGNOSIS — Z952 Presence of prosthetic heart valve: Secondary | ICD-10-CM | POA: Diagnosis not present

## 2021-11-04 DIAGNOSIS — Z952 Presence of prosthetic heart valve: Secondary | ICD-10-CM | POA: Diagnosis not present

## 2021-11-05 DIAGNOSIS — Z952 Presence of prosthetic heart valve: Secondary | ICD-10-CM | POA: Diagnosis not present

## 2021-11-05 DIAGNOSIS — D500 Iron deficiency anemia secondary to blood loss (chronic): Secondary | ICD-10-CM | POA: Diagnosis not present

## 2021-11-05 DIAGNOSIS — K921 Melena: Secondary | ICD-10-CM | POA: Diagnosis not present

## 2021-11-06 DIAGNOSIS — Z952 Presence of prosthetic heart valve: Secondary | ICD-10-CM | POA: Diagnosis not present

## 2021-11-07 DIAGNOSIS — Z952 Presence of prosthetic heart valve: Secondary | ICD-10-CM | POA: Diagnosis not present

## 2021-11-08 DIAGNOSIS — Z952 Presence of prosthetic heart valve: Secondary | ICD-10-CM | POA: Diagnosis not present

## 2021-11-09 DIAGNOSIS — Z952 Presence of prosthetic heart valve: Secondary | ICD-10-CM | POA: Diagnosis not present

## 2021-11-10 DIAGNOSIS — Z952 Presence of prosthetic heart valve: Secondary | ICD-10-CM | POA: Diagnosis not present

## 2021-11-11 ENCOUNTER — Ambulatory Visit (INDEPENDENT_AMBULATORY_CARE_PROVIDER_SITE_OTHER): Payer: Medicare Other | Admitting: *Deleted

## 2021-11-11 DIAGNOSIS — J441 Chronic obstructive pulmonary disease with (acute) exacerbation: Secondary | ICD-10-CM

## 2021-11-11 DIAGNOSIS — G8929 Other chronic pain: Secondary | ICD-10-CM

## 2021-11-11 DIAGNOSIS — Z8719 Personal history of other diseases of the digestive system: Secondary | ICD-10-CM

## 2021-11-11 DIAGNOSIS — M545 Low back pain, unspecified: Secondary | ICD-10-CM

## 2021-11-11 NOTE — Patient Instructions (Addendum)
Visit Information  Thank you for taking time to visit with me today. Please don't hesitate to contact me if I can be of assistance to you before our next scheduled telephone appointment.  Following are the goals we discussed today:   Patient will self administer medications as prescribed as evidenced by self report/primary caregiver report   Patient will attend all scheduled provider appointments as evidenced by clinician review of documented attendance to scheduled appointments and patient/caregiver report  Patient will call provider office for new concerns or questions as evidenced by review of documented incoming telephone call notes and patient report  call office if I gain more than 2 pounds in one day or 5 pounds in one week  keep legs up while sitting  track weight in diary  use salt in moderation  weigh myself daily  follow rescue plan if symptoms flare-up  track symptoms and what helps feel better or worse  - identify and remove indoor air pollutants  - limit outdoor activity during cold weather  - eliminate symptom triggers at home  - follow rescue plan if symptoms flare-up  Contact provider for increase use of rescue inhaler/nebulizer (use rescue inhaler and nebulizer for increase in shortness of breath)  Call LVAD team for increase in shortness of breath on day 2 instead of waiting days later when it is worse  Patient is to contact PCP for post hosp appointment as soon as possible since cardiology appointment rescheduled  Our next appointment is by telephone on 2/11 at 1315  Please call the care guide team at [phone] if you need to cancel or reschedule your appointment.   If you are experiencing a Mental Health or [place] or need someone to talk to, please call the Suicide and Crisis Lifeline: 988 call the [place] National Suicide Prevention Lifeline: (520) 869-0624 or TTY: 458 616 4002 TTY (574)654-5576) to talk to a trained counselor go to [facility] [address], [place] [phone]) call 911   Patient verbalizes understanding of instructions provided today and agrees to view in [place].   Hubert Azure RN, MSN RN Care Management Coordinator [place] [phone] [email or web address]

## 2021-11-13 NOTE — Chronic Care Management (AMB) (Signed)
Chronic Care Management   CCM RN Visit Note  11/13/2021 Name: Ashley [name] MRN: [id number] DOB: 10/21/1951  Subjective: Ashley [name] is a 71 y.o. year old female who is a primary care patient of [name], MD. The care management team was consulted for assistance with disease management and care coordination needs.    Engaged with patient by telephone for follow up visit in response to provider referral for case management and/or care coordination services.   Consent to Services:  The patient was given information about Chronic Care Management services, agreed to services, and gave verbal consent prior to initiation of services.  Please see initial visit note for detailed documentation.   Patient agreed to services and verbal consent obtained.   Assessment: Review of patient past medical history, allergies, medications, health status, including review of consultants reports, laboratory and other test data, was performed as part of comprehensive evaluation and provision of chronic care management services.   SDOH (Social Determinants of Health) assessments and interventions performed:    CCM Care Plan  Allergies  Allergen Reactions   Oxycodone     Other reaction(s): Hallucination Hallucinations to oxycodone noted while inpatient 06/2021. Tolerates hydromorphone.    Vancomycin Nausea And Vomiting and Palpitations   Hydrocodone-Acetaminophen Nausea Only    Outpatient Encounter Medications as of 11/11/2021  Medication Sig Note   acetaminophen (TYLENOL) 325 MG tablet Take 650 mg by mouth every 6 (six) hours as needed.    albuterol (PROAIR HFA) 108 (90 Base) MCG/ACT inhaler Inhale 1-2 puffs into the lungs every 6 (six) hours as needed for wheezing or shortness of breath.    allopurinol (ZYLOPRIM) 100 MG tablet TAKE ONE TABLET BY MOUTH ONCE DAILY    ALPRAZolam (XANAX) 0.5 MG tablet TAKE 1/2 TO 1 TABLET BY MOUTH AT BEDTIME AS NEEDED FOR ANXIETY    amitriptyline (ELAVIL) 25 MG  tablet Take 1 tablet (25 mg total) by mouth at bedtime.    amLODipine (NORVASC) 2.5 MG tablet Take 2.5 mg by mouth daily.    atorvastatin (LIPITOR) 40 MG tablet TAKE ONE TABLET BY MOUTH ONCE DAILY    carvedilol (COREG) 25 MG tablet Take 25 mg by mouth 2 (two) times daily.    colchicine 0.6 MG tablet  01/03/2021: Reports taking only when gout flares up   cyanocobalamin (,VITAMIN B-12,) 1000 MCG/ML injection Inject 1 mL into the muscle monthly (Patient taking differently: Inject 1,000 mcg into the muscle every 30 (thirty) days.)    escitalopram (LEXAPRO) 10 MG tablet TAKE ONE TABLET BY MOUTH ONCE DAILY    ferrous sulfate 325 (65 FE) MG tablet Take 325 mg by mouth daily with breakfast.    fluticasone (FLONASE) 50 MCG/ACT nasal spray Place 1 spray into both nostrils as needed for allergies or rhinitis.    folic acid (FOLVITE) 1 MG tablet TAKE ONE TABLET BY MOUTH ONCE DAILY    GNP MUCUS ER 600 MG 12 hr tablet Take 600 mg by mouth 2 (two) times daily. (Patient not taking: No sig reported) 01/03/2021: Reports taking as needed   HYDROmorphone (DILAUDID) 2 MG tablet Take 1 tablet (2 mg total) by mouth every 6 (six) hours as needed. FOR CHRONIC LOW BACK PAIN    ipratropium-albuterol (DUONEB) 0.5-2.5 (3) MG/3ML SOLN USE 1 VIAL VIA NEBULIZER AND INHALE BY MOUTH INTO THE LUNGS EVERY 6 HOURS AS NEEDED    levothyroxine (SYNTHROID) 75 MCG tablet Take 1 tablet (75 mcg total) by mouth daily.    lidocaine (LIDODERM)  5 % Place 1 patch onto the skin daily. Remove & Discard patch within 12 hours or as directed by MD    magnesium oxide (MAG-OX) 400 (241.3 Mg) MG tablet Take 1 tablet by mouth every Monday, Wednesday, and Friday.    mupirocin ointment (BACTROBAN) 2 % Apply topically daily.    omeprazole (PRILOSEC) 40 MG capsule TAKE ONE CAPSULE BY MOUTH ONCE DAILY (Patient taking differently: Take 40 mg by mouth daily.) 01/03/2021: Reports taking daily   potassium chloride (KLOR-CON) 10 MEQ tablet Take 10 mEq by mouth every  Monday, Wednesday, and Friday. Take 2 tablets every Monday Wednesday Friday 08/25/2021: PRN with fluid pill   sacubitril-valsartan (ENTRESTO) 97-103 MG Take 1 tablet by mouth 2 (two) times daily. (Patient not taking: Reported on 08/25/2021)    senna-docusate (SENOKOT-S) 8.6-50 MG tablet Take by mouth.    spironolactone (ALDACTONE) 25 MG tablet Take 25 mg by mouth daily.    tiZANidine (ZANAFLEX) 2 MG tablet TAKE ONE TABLET BY MOUTH EVERY 6 HOURS AS NEEDED FOR MUSCLE SPASM    torsemide (DEMADEX) 20 MG tablet Take 20 mg by mouth daily. 08/25/2021: PRN   traZODone (DESYREL) 50 MG tablet TAKE 1/2 TO 1 TABLET BY MOUTH EVERY EVENING 1 HOUR BEFORE BEDTIME    TRELEGY ELLIPTA 100-62.5-25 MCG/INH AEPB INHALE 1 PUFF BY MOUTH INTO THE LUNGS EVERY DAY    warfarin (COUMADIN) 2 MG tablet Take 6 mg by mouth at bedtime. Every day except on Thursday take 8mg .    No facility-administered encounter medications on file as of 11/11/2021.    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/02/2021   History of embolectomy 06/02/2021   History of fasciotomy 06/02/2021   Left arm pain 06/02/2021   Malignant neoplasm of colon, unspecified part of colon ([place]) 05/06/2021   Neuropathy 05/06/2021   PAF (paroxysmal atrial fibrillation) ([place]) 02/10/2021   Palpitation 02/10/2021   Red blood cell antibody positive 12/27/2020   Sternal wound infection 12/23/2020   Anticoagulation goal of INR 2 to 3 12/23/2020   LVAD (left ventricular assist device) present ([place]) 17/49/4496   Complication involving left ventricular assist device (LVAD) 12/23/2020   Hypothyroidism 12/23/2020   History of  gastrointestinal bleeding 10/18/2020   Left ventricular assist device (LVAD) complication, initial encounter 09/23/2020   Infection following procedure 09/03/2020   Chronic anticoagulation 08/03/2020   S/P MVR (mitral valve replacement) 07/16/2020   PHT (pulmonary hypertension) ([place]) 07/07/2020   Snoring 07/05/2020   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 ([place]) 07/05/2020   Ischemic cardiomyopathy 01/02/2020   CKD (chronic kidney disease), stage IIIa 12/31/2019   Pulmonary edema cardiac cause ([place]) 12/25/2019   Hyperlipidemia    Gouty arthritis of both feet 11/20/2019   Mitral stenosis with insufficiency    Hypertensive urgency    Depression    Hyperglycemia 09/22/2019   Acute respiratory failure with hypoxia ([place] [employer]) 09/22/2019   Presence of permanent cardiac pacemaker    Acute blood loss anemia    Chronic kidney disease    Hypertension    CHF (congestive heart failure) ([place]) 07/16/2019   Insomnia 05/07/2019   Suspected COVID-19 virus infection 03/23/2019  AVM (arteriovenous malformation) of small bowel, acquired    Anemia, iron deficiency 11/10/2018   Rectal polyp    Benign neoplasm of cecum    Barrett's esophagus without dysplasia    Chronic diastolic heart failure ([place]) 07/03/2018   HTN (hypertension) 07/03/2018   COPD with emphysema ([place]) 06/28/2018   B12 deficiency anemia 06/28/2018   Personal history of colon cancer    Polyp of sigmoid colon    Diverticulosis of large intestine without diverticulitis    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   Renal artery stenosis ([place]) 10/03/2016   Encounter for assessment of implantable cardioverter-defibrillator (ICD) 05/06/2015   Failure of implantable cardioverter-defibrillator (ICD) lead 02/09/2015   Hospital discharge follow-up 02/09/2015   Cardiac resynchronization therapy defibrillator (CRT-D) in place 01/09/2015   Peripheral artery disease ([place]) 01/05/2015   Tobacco abuse 10/11/2014   S/P CABG x 3  07/03/2014   MI (myocardial infarction) ([place]) 07/03/2014   Atherosclerosis of native artery of extremity with intermittent claudication ([place]) 06/18/2013   Preoperative evaluation to rule out surgical contraindication 06/18/2013   CAD (coronary artery disease) 06/01/2013   GERD (gastroesophageal reflux disease) 06/01/2013   Hypercholesterolemia 06/01/2013   Tubular adenoma of colon 06/01/2013   Major depressive disorder in remission ([place]) 06/01/2013    Conditions to be addressed/monitored:CHF, HTN, and DMII  Care Plan : [place] (Adult)  Updates made by Leona Singleton, RN since 11/13/2021 12:00 AM     Problem: Knowledge defucuet related to self care managment of chronic  medical conditions   Priority: High     Long-Range Goal: Patient will work with CCM staff to learn self care skills to manage x   Start Date: 09/19/2021  Expected End Date: 09/19/2022  This Visit's Progress: Not on track  Recent Progress: Not on track  Priority: High  Note:   Current Barriers:  Knowledge Deficits related to plan of care for management of CHF, COPD, and Anemia/GI Bleed, and Spinal Fractures   Care Coordination needs related to Limited social support and Level of care concerns  Patient with recent discharge on 11/10/21 from [facility] related to anemia/GI bleed.  Received several blood transfusions; unsure of definite source of anemia/? GI bleed.  Was recovering from recent hospitalization for arm thrombectomy/fasciotomy.  Patient reporting wounds/incisions are healed.  Diagnosed with spinal fracture post last hospitalization, repots back pains are much better.  Since discharge patient stating she is feeling weak and tired, a little short of  breath.  Weight was 143.9 pounds.  Have not been able to check BP yet.  States she has had to use her rescue nebulizer about 1 time in the past  3 days.  Encouraged patient to contact provider is still with increased need of nebulizer.  Denies any  swelling.  Home Health ordered at discharge but has not made contact with patient as of yet.  No LVAD alarms.  States she will be checkining her INR and sending to Cardiology.  Has post hosp appointment with Cardiology on 1/25 (encouraged to keep and attend)  RNCM Clinical Goal(s):  Patient will verbalize understanding of plan for management of CHF, COPD, and Anemia and Spinal fractures as evidenced by daily weight monitoring, pain management, COPD/Heart failure action plan, monitoring INR levels as ordered verbalize basic understanding of CHF, COPD, and Anemia and Spinal Fractures disease process and self health management plan as evidenced by working with therapy, increasing activity as tolerated, following action plans demonstrate improved health management independence as evidenced  by close communication with providers, making sure INR meter stays in working order; knowing when to call provider based on weight and shortness of breath, LVAD monitoring/alert monitoring, monitoring self for signs and symptoms of anemia        not experience hospital admission as evidenced by review of EMR. Hospital Admissions in last 6 months = 2  through collaboration with RN Care manager, provider, and care team.   Interventions: 1:1 collaboration with primary care provider regarding development and update of comprehensive plan of care as evidenced by provider attestation and co-signature Inter-disciplinary care team collaboration (see longitudinal plan of care) Evaluation of current treatment plan related to  self management and patient's adherence to plan as established by provider Provided patient with number to home health [facility] [phone]   Anemia/Bleeding:  (Status: Goal on track: NO.) Short Term Goal  Assessment of understanding of anemia/bleeding disorder diagnosis Basic overview and discussion of anemia/bleeding disorder or acute disease state Medications reviewed - Counseled on bleeding risk  associated with coumadin therapy and importance of self-monitoring for signs/symptoms of bleeding; - Counseled on avoidance of NSAIDs due to increased bleeding risk; - Counseled on importance of regular laboratory monitoring as directed by provider; - Provided education about signs and symptoms of active bleeding such as stomach discomfort, coughing up blood or blood tinged secretions, bleeding from the gums/teeth, nosebleeds, increased bruising, blood in the urine/stool and/or if a traumatic injury occurs, regardless of severity of injury;  - Advised to call provider or 911 if active bleeding or signs and symptoms of active bleeding occur; - recommended promotion of rest and energy-conserving measures to manage fatigue, such as balancing activity with periods of rest - encouraged strategies to prevent falls related to fatigue, weakness and dizziness; encouraged sitting before standing and using an assistive device - encouraged optimal oral intake to support fluid balance and nutrition Continue to monitor yourself for low hemoglobin (feeling tired, exhausted, pale, shortness of breath)--SEEK MEDICAL ATTENTION RIGHT AWAY Discussed importance of making sure INR machine stays in working order  Heart Failure Interventions:  (Status: Goal on track: NO.)  Long Term Goal  Basic overview and discussion of pathophysiology of Heart Failure reviewed Provided education on low sodium diet Reviewed Heart Failure Action Plan in depth and provided written copy Advised patient to weigh each morning after emptying bladder Discussed importance of daily weight and advised patient to weigh and record daily Discussed the importance of keeping all appointments with provider  COPD: (Status: Goal on track: NO.) Long Term Goal  Reviewed medications with patient, including use of prescribed maintenance and rescue inhalers, and provided instruction on medication management and the importance of adherence Provided patient  with basic written and verbal COPD education on self care/management/and exacerbation prevention Advised patient to track and manage COPD triggers Provided instruction about proper use of medications used for management of COPD including inhalers Advised patient to self assesses COPD action plan zone and make appointment with provider if in the yellow zone for 48 hours without improvement Discussed the importance of adequate rest and management of fatigue with COPD Encouraged to discuss increase SOB with PCP as soon as possible  Pain:  (Status: Goal on track: NO.) Short Term Goal  Pain assessment performed Medications reviewed Reviewed provider established plan for pain management; Discussed importance of adherence to all scheduled medical appointments; Counseled on the importance of reporting any/all new or changed pain symptoms or management strategies to pain management provider; Advised patient to report to care team  affect of pain on daily activities; Discussed use of relaxation techniques and/or diversional activities to assist with pain reduction (distraction, imagery, relaxation, massage, acupressure, TENS, heat, and cold application; Reviewed with patient prescribed pharmacological and nonpharmacological pain relief strategies; Advised patient to discuss continued, mobility limiting pain and increase shortness of breath  with provider; Discuss adding HHPT/OT with PCP  Patient Goals/Self-Care Activities: Patient will self administer medications as prescribed as evidenced by self report/primary caregiver report  Patient will attend all scheduled provider appointments as evidenced by clinician review of documented attendance to scheduled appointments and patient/caregiver report Patient will call provider office for new concerns or questions as evidenced by review of documented incoming telephone call notes and patient report call office if I gain more than 2 pounds in one day or 5  pounds in one week keep legs up while sitting track weight in diary use salt in moderation weigh myself daily follow rescue plan if symptoms flare-up track symptoms and what helps feel better or worse - identify and remove indoor air pollutants - limit outdoor activity during cold weather - eliminate symptom triggers at home - follow rescue plan if symptoms flare-up Contact provider for increase use of rescue inhaler/nebulizer (use rescue inhaler and nebulizer for increase in shortness of breath) Call LVAD team for increase in shortness of breath on day 2 instead of waiting days later when it is worse Patient is to contact PCP for post hosp appointment as soon as possible since cardiology appointment rescheduled        Plan:The care management team will reach out to the patient again over the next 14 days.  Hubert Azure RN, MSN RN Care Management Coordinator [place] [phone] [email or web address]

## 2021-11-15 DIAGNOSIS — I10 Essential (primary) hypertension: Secondary | ICD-10-CM | POA: Diagnosis not present

## 2021-11-15 DIAGNOSIS — J449 Chronic obstructive pulmonary disease, unspecified: Secondary | ICD-10-CM | POA: Diagnosis not present

## 2021-11-15 DIAGNOSIS — K922 Gastrointestinal hemorrhage, unspecified: Secondary | ICD-10-CM | POA: Diagnosis not present

## 2021-11-15 DIAGNOSIS — I5022 Chronic systolic (congestive) heart failure: Secondary | ICD-10-CM | POA: Diagnosis not present

## 2021-11-15 DIAGNOSIS — E1122 Type 2 diabetes mellitus with diabetic chronic kidney disease: Secondary | ICD-10-CM | POA: Diagnosis not present

## 2021-11-15 DIAGNOSIS — M109 Gout, unspecified: Secondary | ICD-10-CM | POA: Diagnosis not present

## 2021-11-15 DIAGNOSIS — Z7901 Long term (current) use of anticoagulants: Secondary | ICD-10-CM | POA: Diagnosis not present

## 2021-11-15 DIAGNOSIS — D649 Anemia, unspecified: Secondary | ICD-10-CM | POA: Diagnosis not present

## 2021-11-15 DIAGNOSIS — N183 Chronic kidney disease, stage 3 unspecified: Secondary | ICD-10-CM | POA: Diagnosis not present

## 2021-11-15 DIAGNOSIS — I739 Peripheral vascular disease, unspecified: Secondary | ICD-10-CM | POA: Diagnosis not present

## 2021-11-15 DIAGNOSIS — S51802D Unspecified open wound of left forearm, subsequent encounter: Secondary | ICD-10-CM | POA: Diagnosis not present

## 2021-11-16 ENCOUNTER — Ambulatory Visit: Payer: Medicare Other | Admitting: *Deleted

## 2021-11-16 DIAGNOSIS — D509 Iron deficiency anemia, unspecified: Secondary | ICD-10-CM

## 2021-11-16 DIAGNOSIS — Z7901 Long term (current) use of anticoagulants: Secondary | ICD-10-CM

## 2021-11-16 DIAGNOSIS — J441 Chronic obstructive pulmonary disease with (acute) exacerbation: Secondary | ICD-10-CM

## 2021-11-16 DIAGNOSIS — I5032 Chronic diastolic (congestive) heart failure: Secondary | ICD-10-CM

## 2021-11-16 NOTE — Chronic Care Management (AMB) (Signed)
Chronic Care Management   CCM RN Visit Note  11/16/2021 Name: Ashley [name] MRN: [id number] DOB: 15-Apr-1951  Subjective: Ashley [name] is a 71 y.o. year old female who is a primary care patient of [name], MD. The care management team was consulted for assistance with disease management and care coordination needs.    Engaged with patient by telephone for follow up visit in response to provider referral for case management and/or care coordination services.   Consent to Services:  The patient was given information about Chronic Care Management services, agreed to services, and gave verbal consent prior to initiation of services.  Please see initial visit note for detailed documentation.   Patient agreed to services and verbal consent obtained.   Assessment: Review of patient past medical history, allergies, medications, health status, including review of consultants reports, laboratory and other test data, was performed as part of comprehensive evaluation and provision of chronic care management services.   SDOH (Social Determinants of Health) assessments and interventions performed:    CCM Care Plan  Allergies  Allergen Reactions   Oxycodone     Other reaction(s): Hallucination Hallucinations to oxycodone noted while inpatient 06/2021. Tolerates hydromorphone.    Vancomycin Nausea And Vomiting and Palpitations   Hydrocodone-Acetaminophen Nausea Only    Outpatient Encounter Medications as of 11/16/2021  Medication Sig Note   acetaminophen (TYLENOL) 325 MG tablet Take 650 mg by mouth every 6 (six) hours as needed.    albuterol (PROAIR HFA) 108 (90 Base) MCG/ACT inhaler Inhale 1-2 puffs into the lungs every 6 (six) hours as needed for wheezing or shortness of breath.    allopurinol (ZYLOPRIM) 100 MG tablet TAKE ONE TABLET BY MOUTH ONCE DAILY    ALPRAZolam (XANAX) 0.5 MG tablet TAKE 1/2 TO 1 TABLET BY MOUTH AT BEDTIME AS NEEDED FOR ANXIETY    amitriptyline (ELAVIL) 25  MG tablet Take 1 tablet (25 mg total) by mouth at bedtime.    amLODipine (NORVASC) 2.5 MG tablet Take 2.5 mg by mouth daily.    atorvastatin (LIPITOR) 40 MG tablet TAKE ONE TABLET BY MOUTH ONCE DAILY    carvedilol (COREG) 25 MG tablet Take 25 mg by mouth 2 (two) times daily.    colchicine 0.6 MG tablet  01/03/2021: Reports taking only when gout flares up   cyanocobalamin (,VITAMIN B-12,) 1000 MCG/ML injection Inject 1 mL into the muscle monthly (Patient taking differently: Inject 1,000 mcg into the muscle every 30 (thirty) days.)    escitalopram (LEXAPRO) 10 MG tablet TAKE ONE TABLET BY MOUTH ONCE DAILY    ferrous sulfate 325 (65 FE) MG tablet Take 325 mg by mouth daily with breakfast.    fluticasone (FLONASE) 50 MCG/ACT nasal spray Place 1 spray into both nostrils as needed for allergies or rhinitis.    folic acid (FOLVITE) 1 MG tablet TAKE ONE TABLET BY MOUTH ONCE DAILY    GNP MUCUS ER 600 MG 12 hr tablet Take 600 mg by mouth 2 (two) times daily. (Patient not taking: No sig reported) 01/03/2021: Reports taking as needed   HYDROmorphone (DILAUDID) 2 MG tablet Take 1 tablet (2 mg total) by mouth every 6 (six) hours as needed. FOR CHRONIC LOW BACK PAIN    ipratropium-albuterol (DUONEB) 0.5-2.5 (3) MG/3ML SOLN USE 1 VIAL VIA NEBULIZER AND INHALE BY MOUTH INTO THE LUNGS EVERY 6 HOURS AS NEEDED    levothyroxine (SYNTHROID) 75 MCG tablet Take 1 tablet (75 mcg total) by mouth daily.    lidocaine (LIDODERM)  5 % Place 1 patch onto the skin daily. Remove & Discard patch within 12 hours or as directed by MD    magnesium oxide (MAG-OX) 400 (241.3 Mg) MG tablet Take 1 tablet by mouth every Monday, Wednesday, and Friday.    mupirocin ointment (BACTROBAN) 2 % Apply topically daily.    omeprazole (PRILOSEC) 40 MG capsule TAKE ONE CAPSULE BY MOUTH ONCE DAILY (Patient taking differently: Take 40 mg by mouth daily.) 01/03/2021: Reports taking daily   potassium chloride (KLOR-CON) 10 MEQ tablet Take 10 mEq by mouth  every Monday, Wednesday, and Friday. Take 2 tablets every Monday Wednesday Friday 08/25/2021: PRN with fluid pill   sacubitril-valsartan (ENTRESTO) 97-103 MG Take 1 tablet by mouth 2 (two) times daily. (Patient not taking: Reported on 08/25/2021)    senna-docusate (SENOKOT-S) 8.6-50 MG tablet Take by mouth.    spironolactone (ALDACTONE) 25 MG tablet Take 25 mg by mouth daily.    tiZANidine (ZANAFLEX) 2 MG tablet TAKE ONE TABLET BY MOUTH EVERY 6 HOURS AS NEEDED FOR MUSCLE SPASM    torsemide (DEMADEX) 20 MG tablet Take 20 mg by mouth daily. 08/25/2021: PRN   traZODone (DESYREL) 50 MG tablet TAKE 1/2 TO 1 TABLET BY MOUTH EVERY EVENING 1 HOUR BEFORE BEDTIME    TRELEGY ELLIPTA 100-62.5-25 MCG/INH AEPB INHALE 1 PUFF BY MOUTH INTO THE LUNGS EVERY DAY    warfarin (COUMADIN) 2 MG tablet Take 6 mg by mouth at bedtime. Every day except on Thursday take 8mg .    No facility-administered encounter medications on file as of 11/16/2021.    Patient Active Problem List   Diagnosis Date Noted   Low back pain without sciatica 08/25/2021   Osteoporosis with current pathological fracture 08/25/2021   Muscle strain of left upper back 07/22/2021   Sequela, post-stroke 07/22/2021   S/P split thickness skin graft 07/11/2021   Hematoma 40/98/1191   Embolic stroke ([place]) 47/82/9562   Brachial artery occlusion, left ([place]) 06/02/2021   History of embolectomy 06/02/2021   History of fasciotomy 06/02/2021   Left arm pain 06/02/2021   Malignant neoplasm of colon, unspecified part of colon ([place]) 05/06/2021   Neuropathy 05/06/2021   PAF (paroxysmal atrial fibrillation) ([place]) 02/10/2021   Palpitation 02/10/2021   Red blood cell antibody positive 12/27/2020   Sternal wound infection 12/23/2020   Anticoagulation goal of INR 2 to 3 12/23/2020   LVAD (left ventricular assist device) present ([place]) 13/06/6577   Complication involving left ventricular assist device (LVAD) 12/23/2020   Hypothyroidism 12/23/2020   History of  gastrointestinal bleeding 10/18/2020   Left ventricular assist device (LVAD) complication, initial encounter 09/23/2020   Infection following procedure 09/03/2020   Chronic anticoagulation 08/03/2020   S/P MVR (mitral valve replacement) 07/16/2020   PHT (pulmonary hypertension) ([place]) 07/07/2020   Snoring 07/05/2020   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 ([place]) 07/05/2020   Ischemic cardiomyopathy 01/02/2020   CKD (chronic kidney disease), stage IIIa 12/31/2019   Pulmonary edema cardiac cause ([place]) 12/25/2019   Hyperlipidemia    Gouty arthritis of both feet 11/20/2019   Mitral stenosis with insufficiency    Hypertensive urgency    Depression    Hyperglycemia 09/22/2019   Acute respiratory failure with hypoxia ([place]) 09/22/2019   Presence of permanent cardiac pacemaker    Acute blood loss anemia    Chronic kidney disease    Hypertension    CHF (congestive heart failure) ([place]) 07/16/2019   Insomnia 05/07/2019   Suspected COVID-19 virus infection 03/23/2019  AVM (arteriovenous malformation) of small bowel, acquired    Anemia, iron deficiency 11/10/2018   Rectal polyp    Benign neoplasm of cecum    Barrett's esophagus without dysplasia    Chronic diastolic heart failure ([place]) 07/03/2018   HTN (hypertension) 07/03/2018   COPD with emphysema ([place]) 06/28/2018   B12 deficiency anemia 06/28/2018   Personal history of colon cancer    Polyp of sigmoid colon    Diverticulosis of large intestine without diverticulitis    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   Renal artery stenosis ([place]) 10/03/2016   Encounter for assessment of implantable cardioverter-defibrillator (ICD) 05/06/2015   Failure of implantable cardioverter-defibrillator (ICD) lead 02/09/2015   Hospital discharge follow-up 02/09/2015   Cardiac resynchronization therapy defibrillator (CRT-D) in place 01/09/2015   Peripheral artery disease ([place]) 01/05/2015   Tobacco abuse 10/11/2014   S/P CABG x 3  07/03/2014   MI (myocardial infarction) ([place]) 07/03/2014   Atherosclerosis of native artery of extremity with intermittent claudication ([place]) 06/18/2013   Preoperative evaluation to rule out surgical contraindication 06/18/2013   CAD (coronary artery disease) 06/01/2013   GERD (gastroesophageal reflux disease) 06/01/2013   Hypercholesterolemia 06/01/2013   Tubular adenoma of colon 06/01/2013   Major depressive disorder in remission ([place]) 06/01/2013    Conditions to be addressed/monitored:CHF, HTN, HLD, COPD, and Anemia  Care Plan : RNCM   General Plan of Care (Adult)  Updates made by Leona Singleton, RN since 11/16/2021 12:00 AM     Problem: Knowledge defucuet related to self care managment of chronic  medical conditions   Priority: High     Long-Range Goal: Patient will work with CCM staff to learn self care skills to manage x   Start Date: 09/19/2021  Expected End Date: 09/19/2022  Recent Progress: Not on track  Priority: High  Note:   Current Barriers:  Knowledge Deficits related to plan of care for management of CHF, COPD, and Anemia/GI Bleed, and Spinal Fractures   Care Coordination needs related to Limited social support and Level of care concerns  Patient with recent discharge on 11/10/21 from [facility] related to anemia/GI bleed.  Received several blood transfusions; unsure of definite source of anemia/? GI bleed.  Was recovering from recent hospitalization for arm thrombectomy/fasciotomy.  Patient reporting wounds/incisions are healed EXCEPT large scab home health is adressing.  Diagnosed with spinal fracture post last hospitalization, repots back pains are much better.  Since discharge patient stating she is feeling weak and tired, a little short of  breath.  Weight was 143.9 pounds.  Have not been able to check BP yet.  States she has had to use her rescue nebulizer about 1 time in the past  3 days.  Encouraged patient to contact provider is still with increased need of nebulizer.   Denies any swelling.  Home Health ordered at discharge but has not made contact with patient as of yet.  No LVAD alarms.  States she will be checkining her INR and sending to Cardiology.  Has post hosp appointment with Cardiology on 1/25 (encouraged to keep and attend)  1/11-States she Is doing well.  Denies short of breath or dizziness, have not needed use rescue inhaler.  Back pain is managable.  Weight 138 this morning.  Was unable to check iNR using home machine (agency checking); home healh nurse able to check  RNCM Clinical Goal(s):  Patient will verbalize understanding of plan for management of CHF, COPD, and Anemia and Spinal fractures as evidenced by daily weight monitoring, pain  management, COPD/Heart failure action plan, monitoring INR levels as ordered verbalize basic understanding of CHF, COPD, and Anemia and Spinal Fractures disease process and self health management plan as evidenced by working with therapy, increasing activity as tolerated, following action plans demonstrate improved health management independence as evidenced by close communication with providers, making sure INR meter stays in working order; knowing when to call provider based on weight and shortness of breath, LVAD monitoring/alert monitoring, monitoring self for signs and symptoms of anemia        not experience hospital admission as evidenced by review of EMR. Hospital Admissions in last 6 months = 2  through collaboration with RN Care manager, provider, and care team.   Interventions: 1:1 collaboration with primary care provider regarding development and update of comprehensive plan of care as evidenced by provider attestation and co-signature Inter-disciplinary care team collaboration (see longitudinal plan of care) Evaluation of current treatment plan related to  self management and patient's adherence to plan as established by provider Provided patient with number to home health [facility]  [phone]   Anemia/Bleeding:  (Status: Goal on track: NO.) Short Term Goal  Assessment of understanding of anemia/bleeding disorder diagnosis Basic overview and discussion of anemia/bleeding disorder or acute disease state Medications reviewed - Counseled on bleeding risk associated with coumadin therapy and importance of self-monitoring for signs/symptoms of bleeding; - Counseled on avoidance of NSAIDs due to increased bleeding risk; - Counseled on importance of regular laboratory monitoring as directed by provider; - Provided education about signs and symptoms of active bleeding such as stomach discomfort, coughing up blood or blood tinged secretions, bleeding from the gums/teeth, nosebleeds, increased bruising, blood in the urine/stool and/or if a traumatic injury occurs, regardless of severity of injury;  - Advised to call provider or 911 if active bleeding or signs and symptoms of active bleeding occur; - recommended promotion of rest and energy-conserving measures to manage fatigue, such as balancing activity with periods of rest - encouraged strategies to prevent falls related to fatigue, weakness and dizziness; encouraged sitting before standing and using an assistive device - encouraged optimal oral intake to support fluid balance and nutrition Continue to monitor yourself for low hemoglobin (feeling tired, exhausted, pale, shortness of breath)--SEEK MEDICAL ATTENTION RIGHT AWAY Discussed importance of making sure INR machine stays in working order  Heart Failure Interventions:  (Status: Goal on track: NO.)  Long Term Goal  Basic overview and discussion of pathophysiology of Heart Failure reviewed Provided education on low sodium diet Reviewed Heart Failure Action Plan in depth and provided written copy Advised patient to weigh each morning after emptying bladder Discussed importance of daily weight and advised patient to weigh and record daily Discussed the importance of keeping  all appointments with provider Discussed daily check list and encouraged patient to complete [facility] Calendar booklet to help document vitals  COPD: (Status: Goal on Track (progressing): YES.) Long Term Goal  Reviewed medications with patient, including use of prescribed maintenance and rescue inhalers, and provided instruction on medication management and the importance of adherence Provided patient with basic written and verbal COPD education on self care/management/and exacerbation prevention Advised patient to track and manage COPD triggers Provided instruction about proper use of medications used for management of COPD including inhalers Advised patient to self assesses COPD action plan zone and make appointment with provider if in the yellow zone for 48 hours without improvement Discussed the importance of adequate rest and management of fatigue with COPD Encouraged to discuss increase  SOB (if it happens) with PCP as soon as possible  Pain:  (Status: Goal on Track (progressing): YES.) Short Term Goal  Pain assessment performed Medications reviewed Reviewed provider established plan for pain management; Discussed importance of adherence to all scheduled medical appointments; Counseled on the importance of reporting any/all new or changed pain symptoms or management strategies to pain management provider; Advised patient to report to care team affect of pain on daily activities; Discussed use of relaxation techniques and/or diversional activities to assist with pain reduction (distraction, imagery, relaxation, massage, acupressure, TENS, heat, and cold application; Reviewed with patient prescribed pharmacological and nonpharmacological pain relief strategies; Advised patient to discuss continued, mobility limiting pain and increase shortness of breath  with provider;  Patient Goals/Self-Care Activities: Patient will self administer medications as prescribed as evidenced by self  report/primary caregiver report  Patient will attend all scheduled provider appointments as evidenced by clinician review of documented attendance to scheduled appointments and patient/caregiver report Patient will call provider office for new concerns or questions as evidenced by review of documented incoming telephone call notes and patient report call office if I gain more than 2 pounds in one day or 5 pounds in one week keep legs up while sitting track weight in diary use salt in moderation weigh myself daily follow rescue plan if symptoms flare-up track symptoms and what helps feel better or worse - identify and remove indoor air pollutants - limit outdoor activity during cold weather - eliminate symptom triggers at home - follow rescue plan if symptoms flare-up Contact provider for increase use of rescue inhaler/nebulizer (use rescue inhaler and nebulizer for increase in shortness of breath) Call LVAD team for increase in shortness of breath on day 2 instead of waiting days later when it is worse Patient is to contact PCP for post hosp appointment as soon as possible since cardiology appointment rescheduled        Plan:The care management team will reach out to the patient again over the next 20 days.  Hubert Azure RN, MSN RN Care Management Coordinator [place] [phone] [email or web address]

## 2021-11-16 NOTE — Patient Instructions (Addendum)
Visit Information  Thank you for taking time to visit with me today. Please don't hesitate to contact me if I can be of assistance to you before our next scheduled telephone appointment.  Following are the goals we discussed today:  Patient will self administer medications as prescribed as evidenced by self report/primary caregiver report  Patient will attend all scheduled provider appointments as evidenced by clinician review of documented attendance to scheduled appointments and patient/caregiver report Patient will call provider office for new concerns or questions as evidenced by review of documented incoming telephone call notes and patient report call office if I gain more than 2 pounds in one day or 5 pounds in one week keep legs up while sitting track weight in diary use salt in moderation weigh myself daily follow rescue plan if symptoms flare-up track symptoms and what helps feel better or worse - identify and remove indoor air pollutants - limit outdoor activity during cold weather - eliminate symptom triggers at home - follow rescue plan if symptoms flare-up Contact provider for increase use of rescue inhaler/nebulizer (use rescue inhaler and nebulizer for increase in shortness of breath) Call LVAD team for increase in shortness of breath on day 2 instead of waiting days later when it is worse Patient is to contact PCP for post hosp appointment as soon as possible since cardiology appointment rescheduled  Our next appointment is by telephone on 1/23 at 1100  Please call the care guide team at [phone] if you need to cancel or reschedule your appointment.   If you are experiencing a Mental Health or [place] or need someone to talk to, please call the Suicide and Crisis Lifeline: 988 call the [place] National Suicide Prevention Lifeline: 786 219 1950 or TTY: 908-543-3978 TTY (425) 503-7002) to talk to a trained counselor go to [facility] [address] [place] [phone]) call 911   Patient verbalizes understanding of instructions and care plan provided today and agrees to view in [place]. Active MyChart status confirmed with patient.    Hubert Azure RN, MSN RN Care Management Coordinator [place] [phone] [email or web address]

## 2021-11-17 DIAGNOSIS — Z7901 Long term (current) use of anticoagulants: Secondary | ICD-10-CM | POA: Diagnosis not present

## 2021-11-17 DIAGNOSIS — Z952 Presence of prosthetic heart valve: Secondary | ICD-10-CM | POA: Diagnosis not present

## 2021-11-18 ENCOUNTER — Ambulatory Visit: Payer: Medicare Other | Admitting: Internal Medicine

## 2021-11-18 DIAGNOSIS — I5022 Chronic systolic (congestive) heart failure: Secondary | ICD-10-CM | POA: Diagnosis not present

## 2021-11-18 DIAGNOSIS — I10 Essential (primary) hypertension: Secondary | ICD-10-CM | POA: Diagnosis not present

## 2021-11-18 DIAGNOSIS — J449 Chronic obstructive pulmonary disease, unspecified: Secondary | ICD-10-CM | POA: Diagnosis not present

## 2021-11-18 DIAGNOSIS — S51802D Unspecified open wound of left forearm, subsequent encounter: Secondary | ICD-10-CM | POA: Diagnosis not present

## 2021-11-18 DIAGNOSIS — E1122 Type 2 diabetes mellitus with diabetic chronic kidney disease: Secondary | ICD-10-CM | POA: Diagnosis not present

## 2021-11-18 DIAGNOSIS — T8189XA Other complications of procedures, not elsewhere classified, initial encounter: Secondary | ICD-10-CM | POA: Diagnosis not present

## 2021-11-18 DIAGNOSIS — I739 Peripheral vascular disease, unspecified: Secondary | ICD-10-CM | POA: Diagnosis not present

## 2021-11-18 DIAGNOSIS — D649 Anemia, unspecified: Secondary | ICD-10-CM | POA: Diagnosis not present

## 2021-11-18 DIAGNOSIS — N183 Chronic kidney disease, stage 3 unspecified: Secondary | ICD-10-CM | POA: Diagnosis not present

## 2021-11-18 DIAGNOSIS — K922 Gastrointestinal hemorrhage, unspecified: Secondary | ICD-10-CM | POA: Diagnosis not present

## 2021-11-18 DIAGNOSIS — M109 Gout, unspecified: Secondary | ICD-10-CM | POA: Diagnosis not present

## 2021-11-22 DIAGNOSIS — D649 Anemia, unspecified: Secondary | ICD-10-CM | POA: Diagnosis not present

## 2021-11-22 DIAGNOSIS — I739 Peripheral vascular disease, unspecified: Secondary | ICD-10-CM | POA: Diagnosis not present

## 2021-11-22 DIAGNOSIS — N183 Chronic kidney disease, stage 3 unspecified: Secondary | ICD-10-CM | POA: Diagnosis not present

## 2021-11-22 DIAGNOSIS — T8189XA Other complications of procedures, not elsewhere classified, initial encounter: Secondary | ICD-10-CM | POA: Diagnosis not present

## 2021-11-22 DIAGNOSIS — I5022 Chronic systolic (congestive) heart failure: Secondary | ICD-10-CM | POA: Diagnosis not present

## 2021-11-22 DIAGNOSIS — S51802D Unspecified open wound of left forearm, subsequent encounter: Secondary | ICD-10-CM | POA: Diagnosis not present

## 2021-11-22 DIAGNOSIS — I10 Essential (primary) hypertension: Secondary | ICD-10-CM | POA: Diagnosis not present

## 2021-11-22 DIAGNOSIS — E1122 Type 2 diabetes mellitus with diabetic chronic kidney disease: Secondary | ICD-10-CM | POA: Diagnosis not present

## 2021-11-22 DIAGNOSIS — M109 Gout, unspecified: Secondary | ICD-10-CM | POA: Diagnosis not present

## 2021-11-22 DIAGNOSIS — J449 Chronic obstructive pulmonary disease, unspecified: Secondary | ICD-10-CM | POA: Diagnosis not present

## 2021-11-22 DIAGNOSIS — K922 Gastrointestinal hemorrhage, unspecified: Secondary | ICD-10-CM | POA: Diagnosis not present

## 2021-11-23 DIAGNOSIS — Z7901 Long term (current) use of anticoagulants: Secondary | ICD-10-CM

## 2021-11-23 DIAGNOSIS — J449 Chronic obstructive pulmonary disease, unspecified: Secondary | ICD-10-CM | POA: Diagnosis not present

## 2021-11-23 DIAGNOSIS — G9340 Encephalopathy, unspecified: Secondary | ICD-10-CM | POA: Diagnosis not present

## 2021-11-23 DIAGNOSIS — K922 Gastrointestinal hemorrhage, unspecified: Secondary | ICD-10-CM | POA: Diagnosis not present

## 2021-11-23 DIAGNOSIS — E1140 Type 2 diabetes mellitus with diabetic neuropathy, unspecified: Secondary | ICD-10-CM | POA: Diagnosis not present

## 2021-11-23 DIAGNOSIS — S51802D Unspecified open wound of left forearm, subsequent encounter: Secondary | ICD-10-CM | POA: Diagnosis not present

## 2021-11-23 DIAGNOSIS — N183 Chronic kidney disease, stage 3 unspecified: Secondary | ICD-10-CM | POA: Diagnosis not present

## 2021-11-23 DIAGNOSIS — I5022 Chronic systolic (congestive) heart failure: Secondary | ICD-10-CM | POA: Diagnosis not present

## 2021-11-23 DIAGNOSIS — M109 Gout, unspecified: Secondary | ICD-10-CM | POA: Diagnosis not present

## 2021-11-23 DIAGNOSIS — Z952 Presence of prosthetic heart valve: Secondary | ICD-10-CM

## 2021-11-23 DIAGNOSIS — Z5181 Encounter for therapeutic drug level monitoring: Secondary | ICD-10-CM

## 2021-11-23 DIAGNOSIS — E1122 Type 2 diabetes mellitus with diabetic chronic kidney disease: Secondary | ICD-10-CM | POA: Diagnosis not present

## 2021-11-23 DIAGNOSIS — Z950 Presence of cardiac pacemaker: Secondary | ICD-10-CM

## 2021-11-23 DIAGNOSIS — E039 Hypothyroidism, unspecified: Secondary | ICD-10-CM

## 2021-11-23 DIAGNOSIS — F419 Anxiety disorder, unspecified: Secondary | ICD-10-CM

## 2021-11-23 DIAGNOSIS — I10 Essential (primary) hypertension: Secondary | ICD-10-CM | POA: Diagnosis not present

## 2021-11-23 DIAGNOSIS — I739 Peripheral vascular disease, unspecified: Secondary | ICD-10-CM | POA: Diagnosis not present

## 2021-11-23 DIAGNOSIS — E785 Hyperlipidemia, unspecified: Secondary | ICD-10-CM

## 2021-11-23 DIAGNOSIS — D649 Anemia, unspecified: Secondary | ICD-10-CM | POA: Diagnosis not present

## 2021-11-23 DIAGNOSIS — Z95810 Presence of automatic (implantable) cardiac defibrillator: Secondary | ICD-10-CM

## 2021-11-23 DIAGNOSIS — F325 Major depressive disorder, single episode, in full remission: Secondary | ICD-10-CM

## 2021-11-28 ENCOUNTER — Ambulatory Visit: Payer: Medicare Other | Admitting: *Deleted

## 2021-11-28 DIAGNOSIS — D509 Iron deficiency anemia, unspecified: Secondary | ICD-10-CM

## 2021-11-28 DIAGNOSIS — J441 Chronic obstructive pulmonary disease with (acute) exacerbation: Secondary | ICD-10-CM

## 2021-11-28 DIAGNOSIS — I5032 Chronic diastolic (congestive) heart failure: Secondary | ICD-10-CM

## 2021-11-28 LAB — HEMOGLOBIN A1C: Hemoglobin A1C: 4.6

## 2021-11-28 NOTE — Patient Instructions (Addendum)
Visit Information  Thank you for taking time to visit with me today. Please don't hesitate to contact me if I can be of assistance to you before our next scheduled telephone appointment.  Following are the goals we discussed today:  Patient will self administer medications as prescribed as evidenced by self report/primary caregiver report  Patient will attend all scheduled provider appointments as evidenced by clinician review of documented attendance to scheduled appointments and patient/caregiver report Patient will call provider office for new concerns or questions as evidenced by review of documented incoming telephone call notes and patient report call office if I gain more than 2 pounds in one day or 5 pounds in one week keep legs up while sitting track weight in diary use salt in moderation weigh myself daily follow rescue plan if symptoms flare-up track symptoms and what helps feel better or worse - identify and remove indoor air pollutants - limit outdoor activity during cold weather - eliminate symptom triggers at home - follow rescue plan if symptoms flare-up Contact provider for increase use of rescue inhaler/nebulizer (use rescue inhaler and nebulizer for increase in shortness of breath) Call LVAD team for increase in shortness of breath on day 2 instead of waiting days later when it is worse Patient is to contact PCP for post hosp appointment as soon as possible since cardiology appointment rescheduled    Our next appointment is by telephone on 2/10 at 1115  Please call the care guide team at [phone] if you need to cancel or reschedule your appointment.   If you are experiencing a Mental Health or [place] or need someone to talk to, please call 911   Patient verbalizes understanding of instructions and care plan provided today and agrees to view in [place]. Active MyChart status confirmed with patient.    Hubert Azure RN, MSN RN Care Management  Coordinator [place] [phone] [email or web address]

## 2021-11-28 NOTE — Chronic Care Management (AMB) (Signed)
Chronic Care Management   CCM RN Visit Note  11/28/2021 Name: Ashley [name] MRN: [id number] DOB: 1950/12/26  Subjective: Ashley [name] is a 71 y.o. year old female who is a primary care patient of [name], MD. The care management team was consulted for assistance with disease management and care coordination needs.    Engaged with patient by telephone for follow up visit in response to provider referral for case management and/or care coordination services.   Consent to Services:  The patient was given information about Chronic Care Management services, agreed to services, and gave verbal consent prior to initiation of services.  Please see initial visit note for detailed documentation.   Patient agreed to services and verbal consent obtained.   Assessment: Review of patient past medical history, allergies, medications, health status, including review of consultants reports, laboratory and other test data, was performed as part of comprehensive evaluation and provision of chronic care management services.   SDOH (Social Determinants of Health) assessments and interventions performed:    CCM Care Plan  Allergies  Allergen Reactions   Oxycodone     Other reaction(s): Hallucination Hallucinations to oxycodone noted while inpatient 06/2021. Tolerates hydromorphone.    Vancomycin Nausea And Vomiting and Palpitations   Hydrocodone-Acetaminophen Nausea Only    Outpatient Encounter Medications as of 11/28/2021  Medication Sig Note   acetaminophen (TYLENOL) 325 MG tablet Take 650 mg by mouth every 6 (six) hours as needed.    albuterol (PROAIR HFA) 108 (90 Base) MCG/ACT inhaler Inhale 1-2 puffs into the lungs every 6 (six) hours as needed for wheezing or shortness of breath.    allopurinol (ZYLOPRIM) 100 MG tablet TAKE ONE TABLET BY MOUTH ONCE DAILY    ALPRAZolam (XANAX) 0.5 MG tablet TAKE 1/2 TO 1 TABLET BY MOUTH AT BEDTIME AS NEEDED FOR ANXIETY    amitriptyline (ELAVIL) 25  MG tablet Take 1 tablet (25 mg total) by mouth at bedtime.    amLODipine (NORVASC) 2.5 MG tablet Take 2.5 mg by mouth daily.    atorvastatin (LIPITOR) 40 MG tablet TAKE ONE TABLET BY MOUTH ONCE DAILY    carvedilol (COREG) 25 MG tablet Take 25 mg by mouth 2 (two) times daily.    colchicine 0.6 MG tablet  01/03/2021: Reports taking only when gout flares up   cyanocobalamin (,VITAMIN B-12,) 1000 MCG/ML injection Inject 1 mL into the muscle monthly (Patient taking differently: Inject 1,000 mcg into the muscle every 30 (thirty) days.)    escitalopram (LEXAPRO) 10 MG tablet TAKE ONE TABLET BY MOUTH ONCE DAILY    ferrous sulfate 325 (65 FE) MG tablet Take 325 mg by mouth daily with breakfast.    fluticasone (FLONASE) 50 MCG/ACT nasal spray Place 1 spray into both nostrils as needed for allergies or rhinitis.    folic acid (FOLVITE) 1 MG tablet TAKE ONE TABLET BY MOUTH ONCE DAILY    GNP MUCUS ER 600 MG 12 hr tablet Take 600 mg by mouth 2 (two) times daily. (Patient not taking: No sig reported) 01/03/2021: Reports taking as needed   HYDROmorphone (DILAUDID) 2 MG tablet Take 1 tablet (2 mg total) by mouth every 6 (six) hours as needed. FOR CHRONIC LOW BACK PAIN    ipratropium-albuterol (DUONEB) 0.5-2.5 (3) MG/3ML SOLN USE 1 VIAL VIA NEBULIZER AND INHALE BY MOUTH INTO THE LUNGS EVERY 6 HOURS AS NEEDED    levothyroxine (SYNTHROID) 75 MCG tablet Take 1 tablet (75 mcg total) by mouth daily.    lidocaine (LIDODERM)  5 % Place 1 patch onto the skin daily. Remove & Discard patch within 12 hours or as directed by MD    magnesium oxide (MAG-OX) 400 (241.3 Mg) MG tablet Take 1 tablet by mouth every Monday, Wednesday, and Friday.    mupirocin ointment (BACTROBAN) 2 % Apply topically daily.    omeprazole (PRILOSEC) 40 MG capsule TAKE ONE CAPSULE BY MOUTH ONCE DAILY (Patient taking differently: Take 40 mg by mouth daily.) 01/03/2021: Reports taking daily   potassium chloride (KLOR-CON) 10 MEQ tablet Take 10 mEq by mouth  every Monday, Wednesday, and Friday. Take 2 tablets every Monday Wednesday Friday 08/25/2021: PRN with fluid pill   sacubitril-valsartan (ENTRESTO) 97-103 MG Take 1 tablet by mouth 2 (two) times daily. (Patient not taking: Reported on 08/25/2021)    senna-docusate (SENOKOT-S) 8.6-50 MG tablet Take by mouth.    spironolactone (ALDACTONE) 25 MG tablet Take 25 mg by mouth daily.    tiZANidine (ZANAFLEX) 2 MG tablet TAKE ONE TABLET BY MOUTH EVERY 6 HOURS AS NEEDED FOR MUSCLE SPASM    torsemide (DEMADEX) 20 MG tablet Take 20 mg by mouth daily. 08/25/2021: PRN   traZODone (DESYREL) 50 MG tablet TAKE 1/2 TO 1 TABLET BY MOUTH EVERY EVENING 1 HOUR BEFORE BEDTIME    TRELEGY ELLIPTA 100-62.5-25 MCG/INH AEPB INHALE 1 PUFF BY MOUTH INTO THE LUNGS EVERY DAY    warfarin (COUMADIN) 2 MG tablet Take 6 mg by mouth at bedtime. Every day except on Thursday take 8mg .    No facility-administered encounter medications on file as of 11/28/2021.    Patient Active Problem List   Diagnosis Date Noted   Low back pain without sciatica 08/25/2021   Osteoporosis with current pathological fracture 08/25/2021   Muscle strain of left upper back 07/22/2021   Sequela, post-stroke 07/22/2021   S/P split thickness skin graft 07/11/2021   Hematoma 96/28/3662   Embolic stroke ([place]) 94/76/5465   Brachial artery occlusion, left ([place]) 06/02/2021   History of embolectomy 06/02/2021   History of fasciotomy 06/02/2021   Left arm pain 06/02/2021   Malignant neoplasm of colon, unspecified part of colon ([place]) 05/06/2021   Neuropathy 05/06/2021   PAF (paroxysmal atrial fibrillation) ([place]) 02/10/2021   Palpitation 02/10/2021   Red blood cell antibody positive 12/27/2020   Sternal wound infection 12/23/2020   Anticoagulation goal of INR 2 to 3 12/23/2020   LVAD (left ventricular assist device) present ([place]) 03/54/6568   Complication involving left ventricular assist device (LVAD) 12/23/2020   Hypothyroidism 12/23/2020   History of  gastrointestinal bleeding 10/18/2020   Left ventricular assist device (LVAD) complication, initial encounter 09/23/2020   Infection following procedure 09/03/2020   Chronic anticoagulation 08/03/2020   S/P MVR (mitral valve replacement) 07/16/2020   PHT (pulmonary hypertension) ([place]) 07/07/2020   Snoring 07/05/2020   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 ([place]) 07/05/2020   Ischemic cardiomyopathy 01/02/2020   CKD (chronic kidney disease), stage IIIa 12/31/2019   Pulmonary edema cardiac cause ([place]) 12/25/2019   Hyperlipidemia    Gouty arthritis of both feet 11/20/2019   Mitral stenosis with insufficiency    Hypertensive urgency    Depression    Hyperglycemia 09/22/2019   Acute respiratory failure with hypoxia ([place]) 09/22/2019   Presence of permanent cardiac pacemaker    Acute blood loss anemia    Chronic kidney disease    Hypertension    CHF (congestive heart failure) ([place]) 07/16/2019   Insomnia 05/07/2019   Suspected COVID-19 virus infection 03/23/2019  AVM (arteriovenous malformation) of small bowel, acquired    Anemia, iron deficiency 11/10/2018   Rectal polyp    Benign neoplasm of cecum    Barrett's esophagus without dysplasia    Chronic diastolic heart failure ([place]) 07/03/2018   HTN (hypertension) 07/03/2018   COPD with emphysema ([place]) 06/28/2018   B12 deficiency anemia 06/28/2018   Personal history of colon cancer    Polyp of sigmoid colon    Diverticulosis of large intestine without diverticulitis    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   Renal artery stenosis ([place]) 10/03/2016   Encounter for assessment of implantable cardioverter-defibrillator (ICD) 05/06/2015   Failure of implantable cardioverter-defibrillator (ICD) lead 02/09/2015   Hospital discharge follow-up 02/09/2015   Cardiac resynchronization therapy defibrillator (CRT-D) in place 01/09/2015   Peripheral artery disease ([place]) 01/05/2015   Tobacco abuse 10/11/2014   S/P CABG x 3  07/03/2014   MI (myocardial infarction) ([place]) 07/03/2014   Atherosclerosis of native artery of extremity with intermittent claudication ([place]) 06/18/2013   Preoperative evaluation to rule out surgical contraindication 06/18/2013   CAD (coronary artery disease) 06/01/2013   GERD (gastroesophageal reflux disease) 06/01/2013   Hypercholesterolemia 06/01/2013   Tubular adenoma of colon 06/01/2013   Major depressive disorder in remission ([place]) 06/01/2013    Conditions to be addressed/monitored:CHF and COPD  Care Plan : [place] (Adult)  Updates made by Leona Singleton, RN since 11/28/2021 12:00 AM     Problem: Knowledge defucuet related to self care managment of chronic  medical conditions   Priority: High     Long-Range Goal: Patient will work with CCM staff to learn self care skills to manage x   Start Date: 09/19/2021  Expected End Date: 09/19/2022  Recent Progress: Not on track  Priority: High  Note:   Current Barriers:  Knowledge Deficits related to plan of care for management of CHF, COPD, and Anemia/GI Bleed, and Spinal Fractures   Care Coordination needs related to Limited social support and Level of care concerns  Patient with recent discharge on 11/10/21 from [facility] related to anemia/GI bleed.  Received several blood transfusions; unsure of definite source of anemia/? GI bleed.  Was recovering from recent hospitalization for arm thrombectomy/fasciotomy.  Patient reporting wounds/incisions are healed EXCEPT large scab home health is adressing.  Diagnosed with spinal fracture post last hospitalization, repots back pains are much better.  Since discharge patient stating she is feeling weak and tired, a little short of  breath.  Weight was 143.9 pounds.  Have not been able to check BP yet.  States she has had to use her rescue nebulizer about 1 time in the past  3 days.  Encouraged patient to contact provider is still with increased need of nebulizer.  Denies any  swelling.  Home Health ordered at discharge but has not made contact with patient as of yet.  No LVAD alarms.  States she will be checkining her INR and sending to Cardiology.  Has post hosp appointment with Cardiology on 1/25 (encouraged to keep and attend)  1/11-States she Is doing well.  Denies short of breath or dizziness, have not needed use rescue inhaler.  Back pain is managable.  Weight 138 this morning.  Was unable to check iNR using home machine (agency checking); home healh nurse able to check  1/23--Patient stating she is doing well, feels good.  States she was able to sit up most of day yesterday and have family christmas celebration.  Weight this morning was  140.  Denies any chest pain, shortness of breath, or lower extremity edema.  Denies need to use nebulizer or rescue inhaler.  States LVAD did alarm few times but cleared before she could assess machine.  RNCM Clinical Goal(s):  Patient will verbalize understanding of plan for management of CHF, COPD, and Anemia and Spinal fractures as evidenced by daily weight monitoring, pain management, COPD/Heart failure action plan, monitoring INR levels as ordered verbalize basic understanding of CHF, COPD, and Anemia and Spinal Fractures disease process and self health management plan as evidenced by working with therapy, increasing activity as tolerated, following action plans demonstrate improved health management independence as evidenced by close communication with providers, making sure INR meter stays in working order; knowing when to call provider based on weight and shortness of breath, LVAD monitoring/alert monitoring, monitoring self for signs and symptoms of anemia        not experience hospital admission as evidenced by review of EMR. Hospital Admissions in last 6 months = 2  through collaboration with RN Care manager, provider, and care team.   Interventions: 1:1 collaboration with primary care provider regarding development and  update of comprehensive plan of care as evidenced by provider attestation and co-signature Inter-disciplinary care team collaboration (see longitudinal plan of care) Evaluation of current treatment plan related to  self management and patient's adherence to plan as established by provider Provided patient with number to home health [facility] [phone]   Anemia/Bleeding:  (Status: Goal on track: NO.) Short Term Goal  Assessment of understanding of anemia/bleeding disorder diagnosis Basic overview and discussion of anemia/bleeding disorder or acute disease state Medications reviewed - Counseled on bleeding risk associated with coumadin therapy and importance of self-monitoring for signs/symptoms of bleeding; - Counseled on avoidance of NSAIDs due to increased bleeding risk; - Counseled on importance of regular laboratory monitoring as directed by provider; - Provided education about signs and symptoms of active bleeding such as stomach discomfort, coughing up blood or blood tinged secretions, bleeding from the gums/teeth, nosebleeds, increased bruising, blood in the urine/stool and/or if a traumatic injury occurs, regardless of severity of injury;  - Advised to call provider or 911 if active bleeding or signs and symptoms of active bleeding occur; - recommended promotion of rest and energy-conserving measures to manage fatigue, such as balancing activity with periods of rest - encouraged strategies to prevent falls related to fatigue, weakness and dizziness; encouraged sitting before standing and using an assistive device - encouraged optimal oral intake to support fluid balance and nutrition Continue to monitor yourself for low hemoglobin (feeling tired, exhausted, pale, shortness of breath)--SEEK MEDICAL ATTENTION RIGHT AWAY Discussed importance of making sure INR machine stays in working order  Heart Failure Interventions:  (Status: Goal on track: NO.)  Long Term Goal  Basic overview and  discussion of pathophysiology of Heart Failure reviewed Provided education on low sodium diet Reviewed Heart Failure Action Plan in depth and provided written copy Advised patient to weigh each morning after emptying bladder Discussed importance of daily weight and advised patient to weigh and record daily Discussed the importance of keeping all appointments with provider Discussed daily check list and encouraged patient to complete Verified patient recieved [facility] Calendar booklet to help document vitals  COPD: (Status: Goal on Track (progressing): YES.) Long Term Goal  Reviewed medications with patient, including use of prescribed maintenance and rescue inhalers, and provided instruction on medication management and the importance of adherence Provided patient with basic written and verbal COPD education on  self care/management/and exacerbation prevention Advised patient to track and manage COPD triggers Provided instruction about proper use of medications used for management of COPD including inhalers Advised patient to self assesses COPD action plan zone and make appointment with provider if in the yellow zone for 48 hours without improvement Discussed the importance of adequate rest and management of fatigue with COPD Encouraged to discuss increase SOB (if it happens) with PCP as soon as possible  Pain:  (Status: Goal on Track (progressing): YES.) Short Term Goal  Pain assessment performed Medications reviewed Reviewed provider established plan for pain management; Discussed importance of adherence to all scheduled medical appointments; Counseled on the importance of reporting any/all new or changed pain symptoms or management strategies to pain management provider; Advised patient to report to care team affect of pain on daily activities; Discussed use of relaxation techniques and/or diversional activities to assist with pain reduction (distraction, imagery, relaxation, massage,  acupressure, TENS, heat, and cold application; Reviewed with patient prescribed pharmacological and nonpharmacological pain relief strategies; Advised patient to discuss continued, mobility limiting pain and increase shortness of breath  with provider;  Patient Goals/Self-Care Activities: Patient will self administer medications as prescribed as evidenced by self report/primary caregiver report  Patient will attend all scheduled provider appointments as evidenced by clinician review of documented attendance to scheduled appointments and patient/caregiver report Patient will call provider office for new concerns or questions as evidenced by review of documented incoming telephone call notes and patient report call office if I gain more than 2 pounds in one day or 5 pounds in one week keep legs up while sitting track weight in diary use salt in moderation weigh myself daily follow rescue plan if symptoms flare-up track symptoms and what helps feel better or worse - identify and remove indoor air pollutants - limit outdoor activity during cold weather - eliminate symptom triggers at home - follow rescue plan if symptoms flare-up Contact provider for increase use of rescue inhaler/nebulizer (use rescue inhaler and nebulizer for increase in shortness of breath) Call LVAD team for increase in shortness of breath on day 2 instead of waiting days later when it is worse Patient is to contact PCP for post hosp appointment as soon as possible since cardiology appointment rescheduled        Plan:The care management team will reach out to the patient again over the next 20 days.  Hubert Azure RN, MSN RN Care Management Coordinator [place] [phone] [email or web address]

## 2021-11-29 ENCOUNTER — Telehealth: Payer: Self-pay

## 2021-11-29 DIAGNOSIS — I428 Other cardiomyopathies: Secondary | ICD-10-CM | POA: Diagnosis not present

## 2021-11-29 DIAGNOSIS — Z4801 Encounter for change or removal of surgical wound dressing: Secondary | ICD-10-CM | POA: Diagnosis not present

## 2021-11-29 DIAGNOSIS — N183 Chronic kidney disease, stage 3 unspecified: Secondary | ICD-10-CM | POA: Diagnosis not present

## 2021-11-29 DIAGNOSIS — I5022 Chronic systolic (congestive) heart failure: Secondary | ICD-10-CM | POA: Diagnosis not present

## 2021-11-29 DIAGNOSIS — Z48812 Encounter for surgical aftercare following surgery on the circulatory system: Secondary | ICD-10-CM | POA: Diagnosis not present

## 2021-11-29 DIAGNOSIS — J449 Chronic obstructive pulmonary disease, unspecified: Secondary | ICD-10-CM | POA: Diagnosis not present

## 2021-11-29 DIAGNOSIS — M109 Gout, unspecified: Secondary | ICD-10-CM | POA: Diagnosis not present

## 2021-11-29 DIAGNOSIS — I739 Peripheral vascular disease, unspecified: Secondary | ICD-10-CM | POA: Diagnosis not present

## 2021-11-29 DIAGNOSIS — S51802D Unspecified open wound of left forearm, subsequent encounter: Secondary | ICD-10-CM | POA: Diagnosis not present

## 2021-11-29 DIAGNOSIS — E1122 Type 2 diabetes mellitus with diabetic chronic kidney disease: Secondary | ICD-10-CM | POA: Diagnosis not present

## 2021-11-29 DIAGNOSIS — K922 Gastrointestinal hemorrhage, unspecified: Secondary | ICD-10-CM | POA: Diagnosis not present

## 2021-11-29 DIAGNOSIS — D649 Anemia, unspecified: Secondary | ICD-10-CM | POA: Diagnosis not present

## 2021-11-29 DIAGNOSIS — Z95811 Presence of heart assist device: Secondary | ICD-10-CM | POA: Diagnosis not present

## 2021-11-29 DIAGNOSIS — I10 Essential (primary) hypertension: Secondary | ICD-10-CM | POA: Diagnosis not present

## 2021-11-29 NOTE — Telephone Encounter (Signed)
[place], RN from [facility] called and stated that she at the pt's home to do a blood draw. She stated that she is unable to get labs on pt, the pt is dizzy, her O2 stats are dropping down to 80%, currently at 94%, and her LVAD is beeping saying low flow. I advised to [place] that I don't know much about an LVAD but with the dizziness and stats dropping she needs to be evaluated at the ED. Dr. Derrel Nip heard the conversation and agreed with going to the ED. [place] gave a verbal understanding.

## 2021-11-30 DIAGNOSIS — I110 Hypertensive heart disease with heart failure: Secondary | ICD-10-CM | POA: Diagnosis not present

## 2021-11-30 DIAGNOSIS — K922 Gastrointestinal hemorrhage, unspecified: Secondary | ICD-10-CM | POA: Diagnosis not present

## 2021-11-30 DIAGNOSIS — Z4682 Encounter for fitting and adjustment of non-vascular catheter: Secondary | ICD-10-CM | POA: Diagnosis not present

## 2021-11-30 DIAGNOSIS — J440 Chronic obstructive pulmonary disease with acute lower respiratory infection: Secondary | ICD-10-CM | POA: Diagnosis not present

## 2021-11-30 DIAGNOSIS — Z452 Encounter for adjustment and management of vascular access device: Secondary | ICD-10-CM | POA: Diagnosis not present

## 2021-11-30 DIAGNOSIS — J441 Chronic obstructive pulmonary disease with (acute) exacerbation: Secondary | ICD-10-CM | POA: Diagnosis not present

## 2021-11-30 DIAGNOSIS — N179 Acute kidney failure, unspecified: Secondary | ICD-10-CM | POA: Diagnosis not present

## 2021-11-30 DIAGNOSIS — I255 Ischemic cardiomyopathy: Secondary | ICD-10-CM | POA: Diagnosis not present

## 2021-11-30 DIAGNOSIS — I5022 Chronic systolic (congestive) heart failure: Secondary | ICD-10-CM | POA: Diagnosis not present

## 2021-11-30 DIAGNOSIS — I5023 Acute on chronic systolic (congestive) heart failure: Secondary | ICD-10-CM | POA: Diagnosis not present

## 2021-11-30 DIAGNOSIS — I5032 Chronic diastolic (congestive) heart failure: Secondary | ICD-10-CM | POA: Diagnosis not present

## 2021-11-30 DIAGNOSIS — Z953 Presence of xenogenic heart valve: Secondary | ICD-10-CM | POA: Diagnosis not present

## 2021-11-30 DIAGNOSIS — J90 Pleural effusion, not elsewhere classified: Secondary | ICD-10-CM | POA: Diagnosis not present

## 2021-11-30 DIAGNOSIS — I739 Peripheral vascular disease, unspecified: Secondary | ICD-10-CM | POA: Diagnosis not present

## 2021-11-30 DIAGNOSIS — E039 Hypothyroidism, unspecified: Secondary | ICD-10-CM | POA: Diagnosis not present

## 2021-11-30 DIAGNOSIS — Z9911 Dependence on respirator [ventilator] status: Secondary | ICD-10-CM | POA: Diagnosis not present

## 2021-11-30 DIAGNOSIS — K3189 Other diseases of stomach and duodenum: Secondary | ICD-10-CM | POA: Diagnosis not present

## 2021-11-30 DIAGNOSIS — J9601 Acute respiratory failure with hypoxia: Secondary | ICD-10-CM | POA: Diagnosis not present

## 2021-11-30 DIAGNOSIS — I272 Pulmonary hypertension, unspecified: Secondary | ICD-10-CM | POA: Diagnosis not present

## 2021-11-30 DIAGNOSIS — E1151 Type 2 diabetes mellitus with diabetic peripheral angiopathy without gangrene: Secondary | ICD-10-CM | POA: Diagnosis not present

## 2021-11-30 DIAGNOSIS — D62 Acute posthemorrhagic anemia: Secondary | ICD-10-CM | POA: Diagnosis not present

## 2021-11-30 DIAGNOSIS — R918 Other nonspecific abnormal finding of lung field: Secondary | ICD-10-CM | POA: Diagnosis not present

## 2021-11-30 DIAGNOSIS — I5084 End stage heart failure: Secondary | ICD-10-CM | POA: Diagnosis not present

## 2021-11-30 DIAGNOSIS — K31819 Angiodysplasia of stomach and duodenum without bleeding: Secondary | ICD-10-CM | POA: Diagnosis not present

## 2021-11-30 DIAGNOSIS — X58XXXA Exposure to other specified factors, initial encounter: Secondary | ICD-10-CM | POA: Diagnosis not present

## 2021-11-30 DIAGNOSIS — D72829 Elevated white blood cell count, unspecified: Secondary | ICD-10-CM | POA: Diagnosis not present

## 2021-11-30 DIAGNOSIS — E1140 Type 2 diabetes mellitus with diabetic neuropathy, unspecified: Secondary | ICD-10-CM | POA: Diagnosis not present

## 2021-11-30 DIAGNOSIS — Z95810 Presence of automatic (implantable) cardiac defibrillator: Secondary | ICD-10-CM | POA: Diagnosis not present

## 2021-11-30 DIAGNOSIS — I5020 Unspecified systolic (congestive) heart failure: Secondary | ICD-10-CM | POA: Diagnosis not present

## 2021-11-30 DIAGNOSIS — D631 Anemia in chronic kidney disease: Secondary | ICD-10-CM | POA: Diagnosis not present

## 2021-11-30 DIAGNOSIS — I468 Cardiac arrest due to other underlying condition: Secondary | ICD-10-CM | POA: Diagnosis not present

## 2021-11-30 DIAGNOSIS — I469 Cardiac arrest, cause unspecified: Secondary | ICD-10-CM | POA: Diagnosis not present

## 2021-11-30 DIAGNOSIS — Z95811 Presence of heart assist device: Secondary | ICD-10-CM | POA: Diagnosis not present

## 2021-11-30 DIAGNOSIS — Z9981 Dependence on supplemental oxygen: Secondary | ICD-10-CM | POA: Diagnosis not present

## 2021-11-30 DIAGNOSIS — I480 Paroxysmal atrial fibrillation: Secondary | ICD-10-CM | POA: Diagnosis not present

## 2021-11-30 DIAGNOSIS — D500 Iron deficiency anemia secondary to blood loss (chronic): Secondary | ICD-10-CM | POA: Diagnosis not present

## 2021-11-30 DIAGNOSIS — S2243XA Multiple fractures of ribs, bilateral, initial encounter for closed fracture: Secondary | ICD-10-CM | POA: Diagnosis not present

## 2021-11-30 DIAGNOSIS — Z7901 Long term (current) use of anticoagulants: Secondary | ICD-10-CM | POA: Diagnosis not present

## 2021-11-30 DIAGNOSIS — D6800 Von Willebrand disease, unspecified: Secondary | ICD-10-CM | POA: Diagnosis not present

## 2021-11-30 DIAGNOSIS — D649 Anemia, unspecified: Secondary | ICD-10-CM | POA: Diagnosis not present

## 2021-11-30 DIAGNOSIS — Z9889 Other specified postprocedural states: Secondary | ICD-10-CM | POA: Diagnosis not present

## 2021-11-30 DIAGNOSIS — E1122 Type 2 diabetes mellitus with diabetic chronic kidney disease: Secondary | ICD-10-CM | POA: Diagnosis not present

## 2021-11-30 DIAGNOSIS — I959 Hypotension, unspecified: Secondary | ICD-10-CM | POA: Diagnosis not present

## 2021-11-30 DIAGNOSIS — D509 Iron deficiency anemia, unspecified: Secondary | ICD-10-CM | POA: Diagnosis not present

## 2021-11-30 DIAGNOSIS — J9811 Atelectasis: Secondary | ICD-10-CM | POA: Diagnosis not present

## 2021-11-30 DIAGNOSIS — Z952 Presence of prosthetic heart valve: Secondary | ICD-10-CM | POA: Diagnosis not present

## 2021-11-30 DIAGNOSIS — T829XXD Unspecified complication of cardiac and vascular prosthetic device, implant and graft, subsequent encounter: Secondary | ICD-10-CM | POA: Diagnosis not present

## 2021-11-30 DIAGNOSIS — M79602 Pain in left arm: Secondary | ICD-10-CM | POA: Diagnosis not present

## 2021-11-30 DIAGNOSIS — I130 Hypertensive heart and chronic kidney disease with heart failure and stage 1 through stage 4 chronic kidney disease, or unspecified chronic kidney disease: Secondary | ICD-10-CM | POA: Diagnosis not present

## 2021-11-30 DIAGNOSIS — R571 Hypovolemic shock: Secondary | ICD-10-CM | POA: Diagnosis not present

## 2021-11-30 DIAGNOSIS — M96A3 Multiple fractures of ribs associated with chest compression and cardiopulmonary resuscitation: Secondary | ICD-10-CM | POA: Diagnosis not present

## 2021-11-30 DIAGNOSIS — Z4659 Encounter for fitting and adjustment of other gastrointestinal appliance and device: Secondary | ICD-10-CM | POA: Diagnosis not present

## 2021-11-30 DIAGNOSIS — K31811 Angiodysplasia of stomach and duodenum with bleeding: Secondary | ICD-10-CM | POA: Diagnosis not present

## 2021-11-30 DIAGNOSIS — I3139 Other pericardial effusion (noninflammatory): Secondary | ICD-10-CM | POA: Diagnosis not present

## 2021-11-30 DIAGNOSIS — I5189 Other ill-defined heart diseases: Secondary | ICD-10-CM | POA: Diagnosis not present

## 2021-11-30 DIAGNOSIS — J811 Chronic pulmonary edema: Secondary | ICD-10-CM | POA: Diagnosis not present

## 2021-11-30 DIAGNOSIS — I429 Cardiomyopathy, unspecified: Secondary | ICD-10-CM | POA: Diagnosis not present

## 2021-11-30 DIAGNOSIS — I517 Cardiomegaly: Secondary | ICD-10-CM | POA: Diagnosis not present

## 2021-11-30 DIAGNOSIS — N183 Chronic kidney disease, stage 3 unspecified: Secondary | ICD-10-CM | POA: Diagnosis not present

## 2021-11-30 DIAGNOSIS — K921 Melena: Secondary | ICD-10-CM | POA: Diagnosis not present

## 2021-12-02 DIAGNOSIS — K921 Melena: Secondary | ICD-10-CM | POA: Diagnosis not present

## 2021-12-05 DIAGNOSIS — Z95811 Presence of heart assist device: Secondary | ICD-10-CM | POA: Diagnosis not present

## 2021-12-06 DIAGNOSIS — J441 Chronic obstructive pulmonary disease with (acute) exacerbation: Secondary | ICD-10-CM | POA: Diagnosis not present

## 2021-12-06 DIAGNOSIS — I5032 Chronic diastolic (congestive) heart failure: Secondary | ICD-10-CM

## 2021-12-06 DIAGNOSIS — D509 Iron deficiency anemia, unspecified: Secondary | ICD-10-CM | POA: Diagnosis not present

## 2021-12-16 ENCOUNTER — Telehealth: Payer: Self-pay

## 2021-12-16 ENCOUNTER — Ambulatory Visit (INDEPENDENT_AMBULATORY_CARE_PROVIDER_SITE_OTHER): Payer: Medicare Other | Admitting: *Deleted

## 2021-12-16 DIAGNOSIS — K5520 Angiodysplasia of colon without hemorrhage: Secondary | ICD-10-CM

## 2021-12-16 DIAGNOSIS — Z8719 Personal history of other diseases of the digestive system: Secondary | ICD-10-CM

## 2021-12-16 DIAGNOSIS — I5032 Chronic diastolic (congestive) heart failure: Secondary | ICD-10-CM

## 2021-12-16 DIAGNOSIS — I5023 Acute on chronic systolic (congestive) heart failure: Secondary | ICD-10-CM

## 2021-12-16 NOTE — Patient Instructions (Addendum)
Visit Information  Thank you for taking time to visit with me today. Please don't hesitate to contact me if I can be of assistance to you before our next scheduled telephone appointment.  Following are the goals we discussed today:  Patient will self administer medications as prescribed as evidenced by self report/primary caregiver report  Patient will attend all scheduled provider appointments as evidenced by clinician review of documented attendance to scheduled appointments and patient/caregiver report Patient will call provider office for new concerns or questions as evidenced by review of documented incoming telephone call notes and patient report call office if I gain more than 2 pounds in one day or 5 pounds in one week keep legs up while sitting track weight in diary use salt in moderation weigh myself daily follow rescue plan if symptoms flare-up track symptoms and what helps feel better or worse - identify and remove indoor air pollutants - limit outdoor activity during cold weather - eliminate symptom triggers at home - follow rescue plan if symptoms flare-up Contact provider for increase use of rescue inhaler/nebulizer (use rescue inhaler and nebulizer for increase in shortness of breath) Call LVAD team for increase in shortness of breath on day 1 or 2 instead of waiting days later when it is worse Patient is to contact PCP for post hosp appointment as soon as possible  Call LVAD team for any LVAD alarms   Our next appointment is by telephone on 2/15 at 1215  Please call the care guide team at [phone] if you need to cancel or reschedule your appointment.   If you are experiencing a Mental Health or [place] or need someone to talk to, please call the Suicide and Crisis Lifeline: 988 call the [place] National Suicide Prevention Lifeline: 215-768-4130 or TTY: 403-412-5823 TTY 380-215-0774) to talk to a trained counselor call 1-800-273-TALK (toll free, 24  hour hotline) call 911   Patient verbalizes understanding of instructions and care plan provided today and agrees to view in [place]. Active MyChart status confirmed with patient.    Hubert Azure RN, MSN RN Care Management Coordinator [place] [phone] [email or web address]

## 2021-12-16 NOTE — Chronic Care Management (AMB) (Addendum)
Chronic Care Management   CCM RN Visit Note  12/16/2021 Name: Ashley [name] MRN: [id number] DOB: [birth year]  Subjective: Ashley [name] is a 71 y.o. year old female who is a primary care patient of [name], MD. The care management team was consulted for assistance with disease management and care coordination needs.    Engaged with patient by telephone for follow up visit in response to provider referral for case management and/or care coordination services.   Consent to Services:  The patient was given information about Chronic Care Management services, agreed to services, and gave verbal consent prior to initiation of services.  Please see initial visit note for detailed documentation.   Patient agreed to services and verbal consent obtained.   Assessment: Review of patient past medical history, allergies, medications, health status, including review of consultants reports, laboratory and other test data, was performed as part of comprehensive evaluation and provision of chronic care management services.   SDOH (Social Determinants of Health) assessments and interventions performed:    CCM Care Plan  Allergies  Allergen Reactions   Oxycodone     Other reaction(s): Hallucination Hallucinations to oxycodone noted while inpatient 06/2021. Tolerates hydromorphone.    Vancomycin Nausea And Vomiting and Palpitations   Hydrocodone-Acetaminophen Nausea Only    Outpatient Encounter Medications as of 12/16/2021  Medication Sig Note   acetaminophen (TYLENOL) 325 MG tablet Take 650 mg by mouth every 6 (six) hours as needed.    albuterol (PROAIR HFA) 108 (90 Base) MCG/ACT inhaler Inhale 1-2 puffs into the lungs every 6 (six) hours as needed for wheezing or shortness of breath.    allopurinol (ZYLOPRIM) 100 MG tablet TAKE ONE TABLET BY MOUTH ONCE DAILY    amitriptyline (ELAVIL) 25 MG tablet Take 1 tablet (25 mg total) by mouth at bedtime.    atorvastatin (LIPITOR) 40 MG tablet  TAKE ONE TABLET BY MOUTH ONCE DAILY    carvedilol (COREG) 25 MG tablet Take 25 mg by mouth 2 (two) times daily.    colchicine 0.6 MG tablet  01/03/2021: Reports taking only when gout flares up   escitalopram (LEXAPRO) 10 MG tablet TAKE ONE TABLET BY MOUTH ONCE DAILY    fluticasone (FLONASE) 50 MCG/ACT nasal spray Place 1 spray into both nostrils as needed for allergies or rhinitis.    folic acid (FOLVITE) 1 MG tablet TAKE ONE TABLET BY MOUTH ONCE DAILY    HYDROmorphone (DILAUDID) 2 MG tablet Take 1 tablet (2 mg total) by mouth every 6 (six) hours as needed. FOR CHRONIC LOW BACK PAIN    ipratropium-albuterol (DUONEB) 0.5-2.5 (3) MG/3ML SOLN USE 1 VIAL VIA NEBULIZER AND INHALE BY MOUTH INTO THE LUNGS EVERY 6 HOURS AS NEEDED    levothyroxine (SYNTHROID) 75 MCG tablet Take 1 tablet (75 mcg total) by mouth daily. 12/16/2021: REPORTS DOSE IS 50 MCG DAILY   lisinopril (ZESTRIL) 5 MG tablet Take 5 mg by mouth daily.    magnesium oxide (MAG-OX) 400 (241.3 Mg) MG tablet Take 1 tablet by mouth every Monday, Wednesday, and Friday.    nitroGLYCERIN (NITROSTAT) 0.4 MG SL tablet Place under the tongue.    pantoprazole (PROTONIX) 40 MG tablet Take 40 mg by mouth 2 (two) times daily.    spironolactone (ALDACTONE) 25 MG tablet Take 25 mg by mouth daily.    torsemide (DEMADEX) 20 MG tablet Take 20 mg by mouth daily. 08/25/2021: PRN   traZODone (DESYREL) 50 MG tablet TAKE 1/2 TO 1 TABLET BY MOUTH EVERY EVENING  1 HOUR BEFORE BEDTIME    TRELEGY ELLIPTA 100-62.5-25 MCG/INH AEPB INHALE 1 PUFF BY MOUTH INTO THE LUNGS EVERY DAY    warfarin (COUMADIN) 2 MG tablet Take 6 mg by mouth at bedtime. Every day except on Thursday take 8mg .    ALPRAZolam (XANAX) 0.5 MG tablet TAKE 1/2 TO 1 TABLET BY MOUTH AT BEDTIME AS NEEDED FOR ANXIETY (Patient not taking: Reported on 12/16/2021)    amLODipine (NORVASC) 2.5 MG tablet Take 2.5 mg by mouth daily. (Patient not taking: Reported on 12/16/2021)    cyanocobalamin (,VITAMIN B-12,) 1000  MCG/ML injection Inject 1 mL into the muscle monthly (Patient taking differently: Inject 1,000 mcg into the muscle every 30 (thirty) days.)    ferrous sulfate 325 (65 FE) MG tablet Take 325 mg by mouth daily with breakfast.    GNP MUCUS ER 600 MG 12 hr tablet Take 600 mg by mouth 2 (two) times daily. (Patient not taking: No sig reported) 01/03/2021: Reports taking as needed   lidocaine (LIDODERM) 5 % Place 1 patch onto the skin daily. Remove & Discard patch within 12 hours or as directed by MD    mupirocin ointment (BACTROBAN) 2 % Apply topically daily.    omeprazole (PRILOSEC) 40 MG capsule TAKE ONE CAPSULE BY MOUTH ONCE DAILY (Patient not taking: Reported on 12/16/2021) 01/03/2021: Reports taking daily   potassium chloride (KLOR-CON) 10 MEQ tablet Take 10 mEq by mouth every Monday, Wednesday, and Friday. Take 2 tablets every Monday Wednesday Friday (Patient not taking: Reported on 12/16/2021) 08/25/2021: PRN with fluid pill   sacubitril-valsartan (ENTRESTO) 97-103 MG Take 1 tablet by mouth 2 (two) times daily. (Patient not taking: Reported on 08/25/2021)    senna-docusate (SENOKOT-S) 8.6-50 MG tablet Take by mouth.    tiZANidine (ZANAFLEX) 2 MG tablet TAKE ONE TABLET BY MOUTH EVERY 6 HOURS AS NEEDED FOR MUSCLE SPASM (Patient not taking: Reported on 12/16/2021)    No facility-administered encounter medications on file as of 12/16/2021.    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   History of embolectomy 06/02/2021   History of fasciotomy 06/02/2021   Left arm pain 06/02/2021   Malignant neoplasm of colon, unspecified part of colon ([place]) 05/06/2021    Neuropathy 05/06/2021   PAF (paroxysmal atrial fibrillation) ([place]) 02/10/2021   Palpitation 02/10/2021   Red blood cell antibody positive 12/27/2020   Sternal wound infection 12/23/2020   Anticoagulation goal of INR 2 to 3 12/23/2020   LVAD (left ventricular assist device) present ([place]) 54/27/0623   Complication involving left ventricular assist device (LVAD) 12/23/2020   Hypothyroidism 12/23/2020   History of gastrointestinal bleeding 10/18/2020   Left ventricular assist device (LVAD) complication, initial encounter 09/23/2020   Infection following procedure 09/03/2020   Chronic anticoagulation 08/03/2020   S/P MVR (mitral valve replacement) 07/16/2020   PHT (pulmonary hypertension) ([place]) 07/07/2020   Snoring 07/05/2020   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 ([place]) 07/05/2020   Ischemic cardiomyopathy 01/02/2020   CKD (chronic kidney disease), stage IIIa 12/31/2019   Pulmonary edema cardiac cause ([place]) 12/25/2019   Hyperlipidemia    Gouty arthritis of both feet 11/20/2019  Mitral stenosis with insufficiency    Hypertensive urgency    Depression    Hyperglycemia 09/22/2019   Acute respiratory failure with hypoxia ([place]) 09/22/2019   Presence of permanent cardiac pacemaker    Acute blood loss anemia    Chronic kidney disease    Hypertension    CHF (congestive heart failure) ([place]) 07/16/2019   Insomnia 05/07/2019   Suspected COVID-19 virus infection 03/23/2019   AVM (arteriovenous malformation) of small bowel, acquired    Anemia, iron deficiency 11/10/2018   Rectal polyp    Benign neoplasm of cecum    Barrett's esophagus without dysplasia    Chronic diastolic heart failure ([place]) 07/03/2018   HTN (hypertension) 07/03/2018   COPD with emphysema ([place]) 06/28/2018   B12 deficiency anemia 06/28/2018   Personal history of colon cancer    Polyp of sigmoid colon    Diverticulosis of large intestine without diverticulitis    Renovascular hypertension  10/03/2016   Renal artery stenosis ([place]) 10/03/2016   Encounter for assessment of implantable cardioverter-defibrillator (ICD) 05/06/2015   Failure of implantable cardioverter-defibrillator (ICD) lead 02/09/2015   Hospital discharge follow-up 02/09/2015   Cardiac resynchronization therapy defibrillator (CRT-D) in place 01/09/2015   Peripheral artery disease ([place]) 01/05/2015   Tobacco abuse 10/11/2014   S/P CABG x 3 07/03/2014   MI (myocardial infarction) ([place]) 07/03/2014   Atherosclerosis of native artery of extremity with intermittent claudication ([place]) 06/18/2013   Preoperative evaluation to rule out surgical contraindication 06/18/2013   CAD (coronary artery disease) 06/01/2013   GERD (gastroesophageal reflux disease) 06/01/2013   Hypercholesterolemia 06/01/2013   Tubular adenoma of colon 06/01/2013   Major depressive disorder in remission ([place]) 06/01/2013    Conditions to be addressed/monitored:CHF, COPD, and Anemia  Care Plan : [facility]   General Plan of Care (Adult)  Updates made by Leona Singleton, RN since 12/16/2021 12:00 AM     Problem: Knowledge defucuet related to self care managment of chronic  medical conditions   Priority: High     Long-Range Goal: Patient will work with CCM staff to learn self care skills to manage x   Start Date: 09/19/2021  Expected End Date: 09/19/2022  Recent Progress: Not on track  Priority: High  Note:   Current Barriers:  Knowledge Deficits related to plan of care for management of CHF, COPD, and Anemia/GI Bleed, and Spinal Fractures   Care Coordination needs related to Limited social support and Level of care concerns  Patient with recent discharge on 11/10/21 from [facility] related to anemia/GI bleed.  Received several blood transfusions; unsure of definite source of anemia/? GI bleed.  Was recovering from recent hospitalization for arm thrombectomy/fasciotomy.  Patient reporting wounds/incisions are healed EXCEPT large scab home health is  adressing.  Diagnosed with spinal fracture post last hospitalization, repots back pains are much better.  Since discharge patient stating she is feeling weak and tired, a little short of  breath.  Weight was 143.9 pounds.  Have not been able to check BP yet.  States she has had to use her rescue nebulizer about 1 time in the past  3 days.  Encouraged patient to contact provider is still with increased need of nebulizer.  Denies any swelling.  Home Health ordered at discharge but has not made contact with patient as of yet.  No LVAD alarms.  States she will be checkining her INR and sending to Cardiology.  Has post hosp appointment with Cardiology on 1/25 (encouraged to keep and attend)  1/11-States she  Is doing well.  Denies short of breath or dizziness, have not needed use rescue inhaler.  Back pain is managable.  Weight 138 this morning.  Was unable to check iNR using home machine (agency checking); home healh nurse able to check  1/23--Patient stating she is doing well, feels good.  States she was able to sit up most of day yesterday and have family christmas celebration.  Weight this morning was 140.  Denies any chest pain, shortness of breath, or lower extremity edema.  Denies need to use nebulizer or rescue inhaler.  States LVAD did alarm few times but cleared before she could assess machine.  2/10--Hospital admission at [facility] 1/27-2/9 with low flow LVAD alarms, anemia & cardiac arrest times 2.  Home since yesterday.  States she is very sore, did not get sleep last night.  Reports she has 7 broken ribs.  Short of breath laying  and then start to cough. Did not weigh this morning.  Have not heard from home health.  Needs pain medicine refilled, encouraged to call PCP.  RNCM Clinical Goal(s):  Patient will verbalize understanding of plan for management of CHF, COPD, and Anemia and Spinal fractures as evidenced by daily weight monitoring, pain management, COPD/Heart failure action plan, monitoring INR  levels as ordered verbalize basic understanding of CHF, COPD, and Anemia and Spinal Fractures disease process and self health management plan as evidenced by working with therapy, increasing activity as tolerated, following action plans demonstrate improved health management independence as evidenced by close communication with providers, making sure INR meter stays in working order; knowing when to call provider based on weight and shortness of breath, LVAD monitoring/alert monitoring, monitoring self for signs and symptoms of anemia        not experience hospital admission as evidenced by review of EMR. Hospital Admissions in last 6 months = 2  through collaboration with RN Care manager, provider, and care team.   Interventions: 1:1 collaboration with primary care provider regarding development and update of comprehensive plan of care as evidenced by provider attestation and co-signature Inter-disciplinary care team collaboration (see longitudinal plan of care) Evaluation of current treatment plan related to  self management and patient's adherence to plan as established by provider Verified patient has number to home health agency; [facility] contacted home health [facility]  [phone]    Anemia/Bleeding:  (Status: Goal on track: NO.) Short Term Goal  Assessment of understanding of anemia/bleeding disorder diagnosis Basic overview and discussion of anemia/bleeding disorder or acute disease state Medications reviewed - Counseled on bleeding risk associated with coumadin therapy and importance of self-monitoring for signs/symptoms of bleeding; - Counseled on avoidance of NSAIDs due to increased bleeding risk; - Counseled on importance of regular laboratory monitoring as directed by provider; - Provided education about signs and symptoms of active bleeding such as stomach discomfort, coughing up blood or blood tinged secretions, bleeding from the gums/teeth, nosebleeds, increased bruising, blood in the  urine/stool and/or if a traumatic injury occurs, regardless of severity of injury;  - Advised to call provider or 911 if active bleeding or signs and symptoms of active bleeding occur; - recommended promotion of rest and energy-conserving measures to manage fatigue, such as balancing activity with periods of rest - encouraged strategies to prevent falls related to fatigue, weakness and dizziness; encouraged sitting before standing and using an assistive device - encouraged optimal oral intake to support fluid balance and nutrition Continue to monitor yourself for low hemoglobin (feeling tired, exhausted, pale, shortness of  breath, lack tarry stools)--SEEK MEDICAL ATTENTION RIGHT AWAY Discussed importance of making sure INR machine stays in working order, instructed/encouraged to go ahead and check machine to see if it working; contact agency for replacement test strips for wasted strips due to machine not working or request new machine  Heart Failure Interventions:  (Status: Goal on track: NO.)  Long Term Goal  Basic overview and discussion of pathophysiology of Heart Failure reviewed Provided education on low sodium diet Reviewed Heart Failure Action Plan in depth and provided written copy Advised patient to weigh each morning after emptying bladder Discussed importance of daily weight and advised patient to weigh and record daily Discussed the importance of keeping all appointments with provider Discussed daily check list and encouraged patient to complete; reinforced use of checklist Verified patient recieved [facility] Calendar booklet to help document vitals  COPD: (Status: Goal on Track (progressing): YES.) Long Term Goal  Reviewed medications with patient, including use of prescribed maintenance and rescue inhalers, and provided instruction on medication management and the importance of adherence Provided patient with basic written and verbal COPD education on self care/management/and  exacerbation prevention Advised patient to track and manage COPD triggers Provided instruction about proper use of medications used for management of COPD including inhalers Advised patient to self assesses COPD action plan zone and make appointment with provider if in the yellow zone for 48 hours without improvement Discussed the importance of adequate rest and management of fatigue with COPD Encouraged to discuss increase SOB (if it happens) with PCP as soon as possible  Pain:  (Status: Goal on track: NO.) Short Term Goal  Pain assessment performed Medications reviewed Reviewed provider established plan for pain management; Discussed importance of adherence to all scheduled medical appointments; Counseled on the importance of reporting any/all new or changed pain symptoms or management strategies to pain management provider; Advised patient to report to care team affect of pain on daily activities; Discussed use of relaxation techniques and/or diversional activities to assist with pain reduction (distraction, imagery, relaxation, massage, acupressure, TENS, heat, and cold application; Reviewed with patient prescribed pharmacological and nonpharmacological pain relief strategies; Advised patient to discuss continued, mobility limiting pain and increase shortness of breath  with provider;  Patient Goals/Self-Care Activities: Patient will self administer medications as prescribed as evidenced by self report/primary caregiver report  Patient will attend all scheduled provider appointments as evidenced by clinician review of documented attendance to scheduled appointments and patient/caregiver report Patient will call provider office for new concerns or questions as evidenced by review of documented incoming telephone call notes and patient report call office if I gain more than 2 pounds in one day or 5 pounds in one week keep legs up while sitting track weight in diary use salt in  moderation weigh myself daily follow rescue plan if symptoms flare-up track symptoms and what helps feel better or worse - identify and remove indoor air pollutants - limit outdoor activity during cold weather - eliminate symptom triggers at home - follow rescue plan if symptoms flare-up Contact provider for increase use of rescue inhaler/nebulizer (use rescue inhaler and nebulizer for increase in shortness of breath) Call LVAD team for increase in shortness of breath on day 1 or 2 instead of waiting days later when it is worse Patient is to contact PCP for post hosp appointment as soon as possible  Call LVAD team for any LVAD alarms        Plan:The care management team will reach out to the  patient again over the next 10 days.  Hubert Azure RN, MSN RN Care Management Coordinator [place] [phone] [email or web address] Encounter details: CCM Time Spent       Value Time User   Time spent with patient (minutes)  90 12/16/2021  7:29 PM Shelby Mattocks Illene Regulus, RN   Time spent performing Chart review  15 12/16/2021  7:29 PM Leona Singleton, RN   Total time (minutes)  105 12/16/2021  7:29 PM Harlis Champoux, Illene Regulus, RN      Moderate to High Complex Decision Making       Value Time User   Moderate to High complex decision making  Yes 12/16/2021  7:29 PM Hanna Aultman, Illene Regulus, RN      CCM Services: This encounter meets complex CCM services and moderate to high decision making.  Prior to outreach and patient consent for Chronic Care Management, I referred this patient for services after reviewing the nominated patient list or from a personal encounter with the patient.  I have personally reviewed this encounter including the documentation in this note and have collaborated with the care management provider regarding care management and care coordination activities to include development and update of the comprehensive care plan. I am certifying that I  agree with the content of this note and encounter as supervising physician.

## 2021-12-16 NOTE — Telephone Encounter (Signed)
Pt called requesting a refill on Hydromorphone. Pt stated that she was just discharged from the hospital and they advised her to reach out to her PCP for a refill. Pt stated that she has 4 fractured ribs and is in pain.   Refilled: 08/25/2021 Last OV: 08/25/2021 Next OV: not scheduled

## 2021-12-16 NOTE — Telephone Encounter (Signed)
I see nowhere in the chart via Epic portal of broken ribs.  And I have not seen her since October  so I cannot refill

## 2021-12-16 NOTE — Telephone Encounter (Signed)
Spoke with pt and informed her of message below. Pt has been scheduled for an appt next week. Pt stated that they performed CPR on her twice and broke her ribs during that time.

## 2021-12-16 NOTE — Telephone Encounter (Signed)
error 

## 2021-12-17 DIAGNOSIS — I10 Essential (primary) hypertension: Secondary | ICD-10-CM | POA: Diagnosis not present

## 2021-12-17 DIAGNOSIS — S51802D Unspecified open wound of left forearm, subsequent encounter: Secondary | ICD-10-CM | POA: Diagnosis not present

## 2021-12-17 DIAGNOSIS — J449 Chronic obstructive pulmonary disease, unspecified: Secondary | ICD-10-CM | POA: Diagnosis not present

## 2021-12-17 DIAGNOSIS — K922 Gastrointestinal hemorrhage, unspecified: Secondary | ICD-10-CM | POA: Diagnosis not present

## 2021-12-17 DIAGNOSIS — E1122 Type 2 diabetes mellitus with diabetic chronic kidney disease: Secondary | ICD-10-CM | POA: Diagnosis not present

## 2021-12-17 DIAGNOSIS — I5022 Chronic systolic (congestive) heart failure: Secondary | ICD-10-CM | POA: Diagnosis not present

## 2021-12-17 DIAGNOSIS — N183 Chronic kidney disease, stage 3 unspecified: Secondary | ICD-10-CM | POA: Diagnosis not present

## 2021-12-17 DIAGNOSIS — M109 Gout, unspecified: Secondary | ICD-10-CM | POA: Diagnosis not present

## 2021-12-17 DIAGNOSIS — I739 Peripheral vascular disease, unspecified: Secondary | ICD-10-CM | POA: Diagnosis not present

## 2021-12-17 DIAGNOSIS — D649 Anemia, unspecified: Secondary | ICD-10-CM | POA: Diagnosis not present

## 2021-12-21 ENCOUNTER — Telehealth: Payer: Self-pay | Admitting: *Deleted

## 2021-12-21 ENCOUNTER — Ambulatory Visit (INDEPENDENT_AMBULATORY_CARE_PROVIDER_SITE_OTHER): Payer: Medicare Other | Admitting: Internal Medicine

## 2021-12-21 ENCOUNTER — Other Ambulatory Visit: Payer: Self-pay

## 2021-12-21 ENCOUNTER — Encounter: Payer: Self-pay | Admitting: Internal Medicine

## 2021-12-21 ENCOUNTER — Telehealth: Payer: Medicare Other

## 2021-12-21 VITALS — BP 110/70 | HR 48 | Temp 98.1°F | Ht 63.0 in | Wt 143.8 lb

## 2021-12-21 DIAGNOSIS — K5520 Angiodysplasia of colon without hemorrhage: Secondary | ICD-10-CM

## 2021-12-21 DIAGNOSIS — Z5181 Encounter for therapeutic drug level monitoring: Secondary | ICD-10-CM

## 2021-12-21 DIAGNOSIS — Z95811 Presence of heart assist device: Secondary | ICD-10-CM | POA: Diagnosis not present

## 2021-12-21 DIAGNOSIS — I255 Ischemic cardiomyopathy: Secondary | ICD-10-CM | POA: Diagnosis not present

## 2021-12-21 DIAGNOSIS — T3695XA Adverse effect of unspecified systemic antibiotic, initial encounter: Secondary | ICD-10-CM | POA: Diagnosis not present

## 2021-12-21 DIAGNOSIS — I701 Atherosclerosis of renal artery: Secondary | ICD-10-CM | POA: Diagnosis not present

## 2021-12-21 DIAGNOSIS — Z7901 Long term (current) use of anticoagulants: Secondary | ICD-10-CM

## 2021-12-21 DIAGNOSIS — I10 Essential (primary) hypertension: Secondary | ICD-10-CM | POA: Diagnosis not present

## 2021-12-21 DIAGNOSIS — K521 Toxic gastroenteritis and colitis: Secondary | ICD-10-CM | POA: Diagnosis not present

## 2021-12-21 DIAGNOSIS — K921 Melena: Secondary | ICD-10-CM | POA: Diagnosis not present

## 2021-12-21 DIAGNOSIS — D62 Acute posthemorrhagic anemia: Secondary | ICD-10-CM | POA: Diagnosis not present

## 2021-12-21 DIAGNOSIS — I5022 Chronic systolic (congestive) heart failure: Secondary | ICD-10-CM | POA: Diagnosis not present

## 2021-12-21 DIAGNOSIS — E1142 Type 2 diabetes mellitus with diabetic polyneuropathy: Secondary | ICD-10-CM | POA: Diagnosis not present

## 2021-12-21 DIAGNOSIS — D509 Iron deficiency anemia, unspecified: Secondary | ICD-10-CM

## 2021-12-21 DIAGNOSIS — Z952 Presence of prosthetic heart valve: Secondary | ICD-10-CM | POA: Diagnosis not present

## 2021-12-21 DIAGNOSIS — E538 Deficiency of other specified B group vitamins: Secondary | ICD-10-CM | POA: Diagnosis not present

## 2021-12-21 LAB — CBC WITH DIFFERENTIAL/PLATELET
Basophils Absolute: 0.1 10*3/uL (ref 0.0–0.1)
Basophils Relative: 1.8 % (ref 0.0–3.0)
Eosinophils Absolute: 0.3 10*3/uL (ref 0.0–0.7)
Eosinophils Relative: 4.6 % (ref 0.0–5.0)
HCT: 32.2 % — ABNORMAL LOW (ref 36.0–46.0)
Hemoglobin: 10.5 g/dL — ABNORMAL LOW (ref 12.0–15.0)
Lymphocytes Relative: 12.6 % (ref 12.0–46.0)
Lymphs Abs: 0.9 10*3/uL (ref 0.7–4.0)
MCHC: 32.5 g/dL (ref 30.0–36.0)
MCV: 95.5 fl (ref 78.0–100.0)
Monocytes Absolute: 0.8 10*3/uL (ref 0.1–1.0)
Monocytes Relative: 11.1 % (ref 3.0–12.0)
Neutro Abs: 4.9 10*3/uL (ref 1.4–7.7)
Neutrophils Relative %: 69.9 % (ref 43.0–77.0)
Platelets: 314 10*3/uL (ref 150.0–400.0)
RBC: 3.38 Mil/uL — ABNORMAL LOW (ref 3.87–5.11)
RDW: 20.4 % — ABNORMAL HIGH (ref 11.5–15.5)
WBC: 7.1 10*3/uL (ref 4.0–10.5)

## 2021-12-21 LAB — PROTIME-INR
INR: 1.8 ratio — ABNORMAL HIGH (ref 0.8–1.0)
Prothrombin Time: 19.4 s — ABNORMAL HIGH (ref 9.6–13.1)

## 2021-12-21 LAB — VITAMIN B12: Vitamin B-12: 631 pg/mL (ref 211–911)

## 2021-12-21 MED ORDER — FOLIC ACID 1 MG PO TABS: 1.0000 mg | ORAL_TABLET | Freq: Every day | ORAL | 1 refills | Status: AC

## 2021-12-21 MED ORDER — HYDROMORPHONE HCL 2 MG PO TABS: 2.0000 mg | ORAL_TABLET | Freq: Four times a day (QID) | ORAL | 0 refills | Status: AC | PRN

## 2021-12-21 NOTE — Progress Notes (Signed)
Subjective:  Patient ID: Ashley [name], female    DOB: 1950/11/17  Age: 71 y.o. MRN: [id number]  CC: The primary encounter diagnosis was Antibiotic-associated diarrhea. 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   This visit occurred during the SARS-CoV-2 public health emergency.  Safety protocols were in place, including screening questions prior to the visit, additional usage of staff PPE, and extensive cleaning of exam room while observing appropriate contact time as indicated for disinfecting solutions.    HPI ALYSA DUCA presents for  Chief Complaint  Patient presents with   Follow-up    Follow up on medication refills    1) acute on chronic pain :  patient  sustained several rib fractures during recent CPR  event  .  The event occurred during admission to [facility] on jan 27 for cardiac arrest.  CT chest confirmed multiple  nondisplaced rib fractures.  She was hospitalized at [facility] after her LVAD kept alarming "low flo"  and then stopped working due to severe anemia due to GI bleed. And required resuscitation.     It appears that she was transfused  4 units of blood  during hospitalization and underwent small bowel enteroscopy with clips placed.  Marland KitchenShe was discharged on Feb 9  hgb was 9.4    2) History of DVT upper arm:  she was discharged from [facility] on  a resumed anticoagulation of  4 mg coumadin daily but has not had her level rechecked bc her home INR   monitoring machine isn't working  INR was 1.8 on Feb 9   3) She is currently finishing a course of doxycycline for treatment of community acquired pneumonia.  Has been having watery  stools 2 or more daily for the past  2 weeks.    Outpatient Medications Prior to Visit  Medication Sig Dispense Refill   acetaminophen (TYLENOL) 325 MG tablet Take 650 mg by mouth every 6 (six) hours as needed.     albuterol (PROAIR HFA) 108 (90 Base) MCG/ACT inhaler Inhale 1-2 puffs into the lungs every 6 (six) hours as needed for wheezing or shortness of breath. 8 g 2   allopurinol (ZYLOPRIM) 100 MG tablet TAKE ONE TABLET BY MOUTH ONCE DAILY 30 tablet 5   amitriptyline (ELAVIL) 25 MG tablet Take 1 tablet (25 mg total) by mouth at bedtime. 90 tablet 0   amLODipine (NORVASC) 2.5 MG tablet Take 2.5 mg by mouth daily.     atorvastatin (LIPITOR) 40 MG tablet TAKE ONE TABLET BY MOUTH ONCE DAILY 90 tablet 1   carvedilol (COREG) 12.5 MG tablet SMARTSIG:1 Tablet(s) By Mouth Every 12 Hours     colchicine 0.6 MG tablet      escitalopram (LEXAPRO) 10 MG tablet TAKE ONE TABLET BY MOUTH ONCE DAILY 90 tablet 1   ferrous sulfate 325 (65 FE) MG tablet Take 325 mg by mouth daily with breakfast.     fluticasone (FLONASE) 50 MCG/ACT nasal spray Place 1 spray into both nostrils as needed for allergies or rhinitis.     GNP MUCUS ER 600 MG 12 hr tablet Take 600 mg by mouth 2 (two) times daily.     ipratropium-albuterol (DUONEB) 0.5-2.5 (3) MG/3ML  SOLN USE 1 VIAL VIA NEBULIZER AND INHALE BY MOUTH INTO THE LUNGS EVERY 6 HOURS AS NEEDED 360 mL 1   levothyroxine (SYNTHROID) 75 MCG tablet Take 1 tablet (75 mcg total) by mouth daily. 90 tablet 1   lisinopril (ZESTRIL) 5 MG tablet Take 5 mg by mouth daily.     magnesium oxide (MAG-OX) 400 (241.3 Mg) MG tablet Take 1 tablet by mouth every Monday, Wednesday, and Friday.     nitroGLYCERIN (NITROSTAT) 0.4 MG SL tablet Place under the tongue.     pantoprazole (PROTONIX) 40 MG tablet Take 40 mg by mouth 2 (two) times daily.     potassium chloride (KLOR-CON) 10 MEQ tablet Take 10 mEq by mouth every Monday, Wednesday, and Friday. Take 2 tablets every Monday Wednesday Friday      sacubitril-valsartan (ENTRESTO) 97-103 MG Take 1 tablet by mouth 2 (two) times daily.     spironolactone (ALDACTONE) 25 MG tablet Take 25 mg by mouth daily.     tiZANidine (ZANAFLEX) 2 MG tablet TAKE ONE TABLET BY MOUTH EVERY 6 HOURS AS NEEDED FOR MUSCLE SPASM 30 tablet 0   torsemide (DEMADEX) 20 MG tablet Take 20 mg by mouth daily.     traZODone (DESYREL) 50 MG tablet TAKE 1/2 TO 1 TABLET BY MOUTH EVERY EVENING 1 HOUR BEFORE BEDTIME 90 tablet 3   TRELEGY ELLIPTA 100-62.5-25 MCG/INH AEPB INHALE 1 PUFF BY MOUTH INTO THE LUNGS EVERY DAY 60 each 2   warfarin (COUMADIN) 2 MG tablet Take 4 mg by mouth at bedtime. Every day except on Thursday take 8mg .     folic acid (FOLVITE) 1 MG tablet TAKE ONE TABLET BY MOUTH ONCE DAILY 30 tablet 0   HYDROmorphone (DILAUDID) 2 MG tablet Take 1 tablet (2 mg total) by mouth every 6 (six) hours as needed. FOR CHRONIC LOW BACK PAIN 120 tablet 0   carvedilol (COREG) 25 MG tablet Take 25 mg by mouth 2 (two) times daily. (Patient not taking: Reported on 12/21/2021)     ALPRAZolam (XANAX) 0.5 MG tablet TAKE 1/2 TO 1 TABLET BY MOUTH AT BEDTIME AS NEEDED FOR ANXIETY (Patient not taking: Reported on 12/16/2021) 30 tablet 3   cyanocobalamin (,VITAMIN B-12,) 1000 MCG/ML injection Inject 1 mL into the muscle monthly (Patient not taking: Reported on 12/21/2021) 10 mL 0   lidocaine (LIDODERM) 5 % Place 1 patch onto the skin daily. Remove & Discard patch within 12 hours or as directed by MD (Patient not taking: Reported on 12/21/2021) 30 patch 0   mupirocin ointment (BACTROBAN) 2 % Apply topically daily. (Patient not taking: Reported on 12/21/2021)     omeprazole (PRILOSEC) 40 MG capsule TAKE ONE CAPSULE BY MOUTH ONCE DAILY (Patient not taking: Reported on 12/21/2021) 90 capsule 1   senna-docusate (SENOKOT-S) 8.6-50 MG tablet Take by mouth. (Patient not taking: Reported on 12/21/2021)     No facility-administered medications prior to visit.    Review of Systems;  Patient denies  headache, fevers, malaise, unintentional weight loss, skin rash, eye pain, sinus congestion and sinus pain, sore throat, dysphagia,  hemoptysis , cough, dyspnea, wheezing, chest pain, palpitations, orthopnea, edema, abdominal pain, nausea, melena, diarrhea, constipation, flank pain, dysuria, hematuria, urinary  Frequency, nocturia, numbness, tingling, seizures,  Focal weakness, Loss of consciousness,  Tremor, insomnia, depression, anxiety, and suicidal ideation.      Objective:  BP 110/70 (BP Location: Right Arm, Patient Position: Sitting, Cuff Size: Normal)    Pulse (!) 48    Temp 98.1 F (36.7  C) (Oral)    Ht 5\' 3"  (1.6 m)    Wt 143 lb 12.8 oz (65.2 kg)    SpO2 99%    BMI 25.47 kg/m   BP Readings from Last 3 Encounters:  12/21/21 110/70  07/25/21 (!) 90/56  05/06/21 98/70    Wt Readings from Last 3 Encounters:  12/21/21 143 lb 12.8 oz (65.2 kg)  08/25/21 148 lb (67.1 kg)  07/21/21 147 lb (66.7 kg)    General appearance: alert, cooperative and appears stated age Ears: normal TM's and external ear canals both ears Throat: lips, mucosa, and tongue normal; teeth and gums normal Neck: no adenopathy, no carotid bruit, supple, symmetrical, trachea midline and thyroid not enlarged, symmetric, no tenderness/mass/nodules Back: symmetric, no curvature. ROM normal. No CVA tenderness. Lungs: clear to auscultation bilaterally Heart: regular rate and rhythm, S1, S2 normal, no murmur, click, rub or gallop Abdomen: soft, non-tender; bowel sounds normal; no masses,  no organomegaly Pulses: 2+ and symmetric Skin: Skin color, texture, turgor normal. No rashes or lesions Lymph nodes: Cervical, supraclavicular, and axillary nodes normal.  Lab Results  Component Value Date   HGBA1C 4.6 11/28/2021   HGBA1C 5.0 05/06/2021   HGBA1C 4.7 10/15/2020    Lab Results  Component Value Date   CREATININE 0.95 12/21/2021   CREATININE 1.53 (H) 05/06/2021   CREATININE 1.00 12/24/2020    Lab Results   Component Value Date   WBC 7.1 12/21/2021   HGB 10.5 (L) 12/21/2021   HCT 32.2 (L) 12/21/2021   PLT 314.0 12/21/2021   GLUCOSE 96 12/21/2021   CHOL 155 01/01/2020   TRIG 70 01/01/2020   HDL 50 01/01/2020   LDLDIRECT 62.0 11/08/2018   LDLCALC 91 01/01/2020   ALT 13 12/21/2021   AST 14 12/21/2021   NA 139 12/21/2021   K 3.8 12/21/2021   CL 106 12/21/2021   CREATININE 0.95 12/21/2021   BUN 10 12/21/2021   CO2 21 12/21/2021   TSH 8.99 (H) 05/06/2021   INR 1.8 (H) 12/21/2021   HGBA1C 4.6 11/28/2021   MICROALBUR <0.7 07/05/2020    DG Lumbar Spine Complete  Result Date: 08/25/2021 CLINICAL DATA:  Persistent low back pain. Acute bilateral low back pain without sciatica smoker. EXAM: LUMBAR SPINE - COMPLETE 4+ VIEW COMPARISON:  None. FINDINGS: The bones are subjectively under mineralized. Moderate T12 compression deformity with approximately 50% loss of height anteriorly. Mild L2 superior endplate compression deformity with less than 30% loss of height anteriorly. Undulation of the inferior endplate of L4 with less than 20% loss of height. Mild multilevel endplate spurring with preservation of disc spaces. There is mild L5-S1 facet hypertrophy. The sacroiliac joints are congruent. Vascular stent in the region of the SMA and both common iliac arteries. The aorta is densely calcified. Left ventricular assist device is partially included. IMPRESSION: 1. Moderate T12 and mild L2 compression fractures, age indeterminate. 2. Undulation of the inferior endplate of L4, may represent compression fracture, age indeterminate. 3. Mild multilevel spondylosis with endplate spurring. 4. Diffuse bony under mineralization with osteopenia/osteoporosis. These results will be called to the ordering clinician or representative by the Radiologist Assistant, and communication documented in the PACS or [employer]. Electronically Signed   By: Keith Rake M.D.   On: 08/25/2021 15:36    Assessment & Plan:    Problem List Items Addressed This Visit     Anemia, iron deficiency    Secondary to GI bleed.  She was transfused several (4?) units during recent hospitalization.  hgb has improved since discharge.  Lab Results  Component Value Date   WBC 7.1 12/21/2021   HGB 10.5 (L) 12/21/2021   HCT 32.2 (L) 12/21/2021   MCV 95.5 12/21/2021   PLT 314.0 12/21/2021         Relevant Medications   folic acid (FOLVITE) 1 MG tablet   Anticoagulation goal of INR 2 to 3    INR is unchanged today at 1.8 and she is taking 4 mg coumadin.  She will follow up with her coumadin  clinic at [facility] (Completed)   AVM (arteriovenous malformation) of small bowel, acquired    Presumed to be the source of her recent GI bleed.  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 ([place])    Resolved for over 2 years based on A1c's    Lab Results  Component Value Date   HGBA1C 4.6 11/28/2021         Other Visit Diagnoses     Antibiotic-associated diarrhea    -  Primary   Relevant Orders   Gastrointestinal Panel by PCR , Stool   C Difficile Quick Screen w PCR reflex   Folic acid deficiency       Relevant Orders   RBC Folate   Anemia due to acute blood loss       Relevant Medications   folic acid (FOLVITE) 1 MG tablet   Other Relevant Orders   CBC with Differential/Platelet (Completed)   Comprehensive metabolic panel (Completed)   B12 deficiency       Relevant Orders   Vitamin B12 (Completed)   Hypomagnesemia       Relevant Orders   Magnesium (Completed)       I spent 30 minutes dedicated to the care of this patient on the date of this encounter to include pre-visit review of patient's medical history,  most  recent imaging studies, Face-to-face time with the patient , and post visit ordering of testing and therapeutics.    Follow-up: No follow-ups on file.   [name], MD

## 2021-12-21 NOTE — Patient Instructions (Addendum)
Your watery stools may be due to the antibiotics you have been taking .  This type of infection  can be very serious   Please  collect your watery stool in the tubes provided and then take your stool sample to the HOSPITAL LAB at the medical mall.   We cannot receive it here   I AM CHECKING YOUR INR , HEMOGLOBIN ETC TODAY

## 2021-12-21 NOTE — Telephone Encounter (Signed)
°  Care Management   Follow Up Note   12/21/2021 Name: Ashley [name] MRN: [id number] DOB: 07-Jan-1951   Referred by: [name], MD Reason for referral : Chronic Care Management (POST HOSP,DM)   An unsuccessful telephone outreach was attempted today. The patient was referred to the case management team for assistance with care management and care coordination. Upon chart review patient had appointment wit provider around same time.  Follow Up Plan: The care management team will reach out to the patient again over the next 5 days.   Hubert Azure RN, MSN RN Care Management Coordinator [place] [phone] [email or web address]

## 2021-12-22 ENCOUNTER — Telehealth: Payer: Self-pay | Admitting: Internal Medicine

## 2021-12-22 DIAGNOSIS — J449 Chronic obstructive pulmonary disease, unspecified: Secondary | ICD-10-CM | POA: Diagnosis not present

## 2021-12-22 DIAGNOSIS — S51802D Unspecified open wound of left forearm, subsequent encounter: Secondary | ICD-10-CM | POA: Diagnosis not present

## 2021-12-22 DIAGNOSIS — I739 Peripheral vascular disease, unspecified: Secondary | ICD-10-CM | POA: Diagnosis not present

## 2021-12-22 DIAGNOSIS — I10 Essential (primary) hypertension: Secondary | ICD-10-CM | POA: Diagnosis not present

## 2021-12-22 DIAGNOSIS — I5022 Chronic systolic (congestive) heart failure: Secondary | ICD-10-CM | POA: Diagnosis not present

## 2021-12-22 DIAGNOSIS — M109 Gout, unspecified: Secondary | ICD-10-CM | POA: Diagnosis not present

## 2021-12-22 DIAGNOSIS — N183 Chronic kidney disease, stage 3 unspecified: Secondary | ICD-10-CM | POA: Diagnosis not present

## 2021-12-22 DIAGNOSIS — E1122 Type 2 diabetes mellitus with diabetic chronic kidney disease: Secondary | ICD-10-CM | POA: Diagnosis not present

## 2021-12-22 DIAGNOSIS — K922 Gastrointestinal hemorrhage, unspecified: Secondary | ICD-10-CM | POA: Diagnosis not present

## 2021-12-22 DIAGNOSIS — D649 Anemia, unspecified: Secondary | ICD-10-CM | POA: Diagnosis not present

## 2021-12-22 LAB — COMPREHENSIVE METABOLIC PANEL
ALT: 13 U/L (ref 0–35)
AST: 14 U/L (ref 0–37)
Albumin: 3.8 g/dL (ref 3.5–5.2)
Alkaline Phosphatase: 133 U/L — ABNORMAL HIGH (ref 39–117)
BUN: 10 mg/dL (ref 6–23)
CO2: 21 mEq/L (ref 19–32)
Calcium: 9.2 mg/dL (ref 8.4–10.5)
Chloride: 106 mEq/L (ref 96–112)
Creatinine, Ser: 0.95 mg/dL (ref 0.40–1.20)
GFR: 61.5 mL/min (ref >60.00)
Glucose, Bld: 96 mg/dL (ref 70–99)
Potassium: 3.8 mEq/L (ref 3.5–5.1)
Sodium: 139 mEq/L (ref 135–145)
Total Bilirubin: 0.8 mg/dL (ref 0.2–1.2)
Total Protein: 7.2 g/dL (ref 6.0–8.3)

## 2021-12-22 LAB — MAGNESIUM: Magnesium: 1.2 mg/dL — ABNORMAL LOW (ref 1.5–2.5)

## 2021-12-22 NOTE — Telephone Encounter (Signed)
[name] [facility] called stating pt weight is 146 and pain level is 7

## 2021-12-22 NOTE — Telephone Encounter (Signed)
Spoke with Ashley [name] & she verbalized understanding.

## 2021-12-22 NOTE — Telephone Encounter (Signed)
Given weight stable - from same scale (one pound difference) and given no sob with ambulation, will hold on making changes.  Appears just started pain medication yesterday evening.  Continue same for now and reassess control.  Monitor and call if any change.

## 2021-12-22 NOTE — Telephone Encounter (Signed)
Spoke with Erline Levine with [facility], she called to report pt's weight and pain level from today. She stated that she called to report the weight because it was flagging her in their chart but she stated that the pt's range is 135-142 but she stated that those will probably need to be changed. I asked Erline Levine what her last weight was on her home scale and she stated on 12/17/2021 her weight was 145 and today on the same scale it was 146. Pt was able to walk 100 ft for 1 minute and 42 seconds with no SOBr, o2 was 94% room air. Pt has started taking the hydromorphone at night since picking it up yesterday and tylenol but states that her pain level is still a 7.

## 2021-12-23 ENCOUNTER — Ambulatory Visit: Payer: Medicare Other | Admitting: *Deleted

## 2021-12-23 ENCOUNTER — Telehealth: Payer: Self-pay

## 2021-12-23 DIAGNOSIS — J441 Chronic obstructive pulmonary disease with (acute) exacerbation: Secondary | ICD-10-CM

## 2021-12-23 DIAGNOSIS — J449 Chronic obstructive pulmonary disease, unspecified: Secondary | ICD-10-CM | POA: Diagnosis not present

## 2021-12-23 DIAGNOSIS — D62 Acute posthemorrhagic anemia: Secondary | ICD-10-CM

## 2021-12-23 DIAGNOSIS — I5032 Chronic diastolic (congestive) heart failure: Secondary | ICD-10-CM

## 2021-12-23 DIAGNOSIS — E1122 Type 2 diabetes mellitus with diabetic chronic kidney disease: Secondary | ICD-10-CM | POA: Diagnosis not present

## 2021-12-23 DIAGNOSIS — M109 Gout, unspecified: Secondary | ICD-10-CM | POA: Diagnosis not present

## 2021-12-23 DIAGNOSIS — S51802D Unspecified open wound of left forearm, subsequent encounter: Secondary | ICD-10-CM | POA: Diagnosis not present

## 2021-12-23 DIAGNOSIS — I5022 Chronic systolic (congestive) heart failure: Secondary | ICD-10-CM | POA: Diagnosis not present

## 2021-12-23 DIAGNOSIS — K922 Gastrointestinal hemorrhage, unspecified: Secondary | ICD-10-CM | POA: Diagnosis not present

## 2021-12-23 DIAGNOSIS — I739 Peripheral vascular disease, unspecified: Secondary | ICD-10-CM | POA: Diagnosis not present

## 2021-12-23 DIAGNOSIS — N183 Chronic kidney disease, stage 3 unspecified: Secondary | ICD-10-CM | POA: Diagnosis not present

## 2021-12-23 DIAGNOSIS — I10 Essential (primary) hypertension: Secondary | ICD-10-CM | POA: Diagnosis not present

## 2021-12-23 DIAGNOSIS — D649 Anemia, unspecified: Secondary | ICD-10-CM | POA: Diagnosis not present

## 2021-12-23 NOTE — Telephone Encounter (Signed)
° °  Telephone encounter was:  Unsuccessful.  12/23/2021 Name: Ashley [name] MRN: [id number] DOB: [birth year]  Unsuccessful outbound call made today to assist with:  Transportation Needs   Outreach Attempt:  1st Attempt  A HIPAA compliant voice message was left requesting a return call.  Instructed patient to call back at  earliest convenience.  [place], Care Management  [phone] [address], [place], [place] [address] Phone: [phone] Email: [email or web address]

## 2021-12-24 LAB — FOLATE RBC: RBC Folate: 921 ng/mL RBC (ref >280)

## 2021-12-24 NOTE — Assessment & Plan Note (Signed)
Resolved for over 2 years based on A1c's    Lab Results  Component Value Date   HGBA1C 4.6 11/28/2021

## 2021-12-24 NOTE — Assessment & Plan Note (Signed)
Secondary to GI bleed.  She was transfused several (4?) units during recent hospitalization. hgb has improved since discharge.  Lab Results  Component Value Date   WBC 7.1 12/21/2021   HGB 10.5 (L) 12/21/2021   HCT 32.2 (L) 12/21/2021   MCV 95.5 12/21/2021   PLT 314.0 12/21/2021

## 2021-12-24 NOTE — Assessment & Plan Note (Addendum)
Presumed to be the source of her recent GI bleed.  She underwent enteroscopy and clipping during Jan 2023 hospitalization [facility]

## 2021-12-24 NOTE — Assessment & Plan Note (Signed)
INR is unchanged today at 1.8 and she is taking 4 mg coumadin.  She will follow up with her coumadin  clinic at [facility]

## 2021-12-25 NOTE — Patient Instructions (Addendum)
Visit Information  Thank you for taking time to visit with me today. Please don't hesitate to contact me if I can be of assistance to you before our next scheduled telephone appointment.  Following are the goals we discussed today:  Patient will self administer medications as prescribed as evidenced by self report/primary caregiver report  Patient will attend all scheduled provider appointments as evidenced by clinician review of documented attendance to scheduled appointments and patient/caregiver report Patient will call provider office for new concerns or questions as evidenced by review of documented incoming telephone call notes and patient report call office if I gain more than 2 pounds in one day or 5 pounds in one week keep legs up while sitting track weight in diary use salt in moderation weigh myself daily follow rescue plan if symptoms flare-up track symptoms and what helps feel better or worse - identify and remove indoor air pollutants - limit outdoor activity during cold weather - eliminate symptom triggers at home - follow rescue plan if symptoms flare-up Contact provider for increase use of rescue inhaler/nebulizer (use rescue inhaler and nebulizer for increase in shortness of breath) Call LVAD team for increase in shortness of breath on day 1 or 2 instead of waiting days later when it is worse Patient is to contact PCP for post hosp appointment as soon as possible  Call LVAD team for any LVAD alarms   Our next appointment is by telephone on 2/24 at 1030  Please call the care guide team at [phone] if you need to cancel or reschedule your appointment.   If you are experiencing a Mental Health or [facility] or need someone to talk to, please call the Suicide and Crisis Lifeline: 988 call the [place] National Suicide Prevention Lifeline: (956)453-7652 or TTY: 812-551-4531 TTY 323-233-4599) to talk to a trained counselor call 1-800-273-TALK (toll free, 24  hour hotline) call 911   The patient verbalized understanding of instructions, educational materials, and care plan provided today and agreed to receive a mailed copy of patient instructions, educational materials, and care plan.   Hubert Azure RN, MSN RN Care Management Coordinator [place] [phone] [email or web address]

## 2021-12-25 NOTE — Chronic Care Management (AMB) (Signed)
Chronic Care Management   CCM RN Visit Note  12/25/2021 Name: Ashley [name] MRN: [id number] DOB: April 11, 1951  Subjective: Ashley [name] is a 71 y.o. year old female who is a primary care patient of [name], MD. The care management team was consulted for assistance with disease management and care coordination needs.    Engaged with patient by telephone for follow up visit in response to provider referral for case management and/or care coordination services.   Consent to Services:  The patient was given information about Chronic Care Management services, agreed to services, and gave verbal consent prior to initiation of services.  Please see initial visit note for detailed documentation.   Patient agreed to services and verbal consent obtained.   Assessment: Review of patient past medical history, allergies, medications, health status, including review of consultants reports, laboratory and other test data, was performed as part of comprehensive evaluation and provision of chronic care management services.   SDOH (Social Determinants of Health) assessments and interventions performed:    CCM Care Plan  Allergies  Allergen Reactions   Oxycodone     Other reaction(s): Hallucination Hallucinations to oxycodone noted while inpatient 06/2021. Tolerates hydromorphone.    Vancomycin Nausea And Vomiting and Palpitations   Hydrocodone-Acetaminophen Nausea Only    Outpatient Encounter Medications as of 12/23/2021  Medication Sig Note   acetaminophen (TYLENOL) 325 MG tablet Take 650 mg by mouth every 6 (six) hours as needed.    albuterol (PROAIR HFA) 108 (90 Base) MCG/ACT inhaler Inhale 1-2 puffs into the lungs every 6 (six) hours as needed for wheezing or shortness of breath.    allopurinol (ZYLOPRIM) 100 MG tablet TAKE ONE TABLET BY MOUTH ONCE DAILY    amitriptyline (ELAVIL) 25 MG tablet Take 1 tablet (25 mg total) by mouth at bedtime.    amLODipine (NORVASC) 2.5 MG tablet  Take 2.5 mg by mouth daily.    atorvastatin (LIPITOR) 40 MG tablet TAKE ONE TABLET BY MOUTH ONCE DAILY    carvedilol (COREG) 12.5 MG tablet SMARTSIG:1 Tablet(s) By Mouth Every 12 Hours    carvedilol (COREG) 25 MG tablet Take 25 mg by mouth 2 (two) times daily. (Patient not taking: Reported on 12/21/2021)    colchicine 0.6 MG tablet  01/03/2021: Reports taking only when gout flares up   escitalopram (LEXAPRO) 10 MG tablet TAKE ONE TABLET BY MOUTH ONCE DAILY    ferrous sulfate 325 (65 FE) MG tablet Take 325 mg by mouth daily with breakfast.    fluticasone (FLONASE) 50 MCG/ACT nasal spray Place 1 spray into both nostrils as needed for allergies or rhinitis.    folic acid (FOLVITE) 1 MG tablet Take 1 tablet (1 mg total) by mouth daily.    GNP MUCUS ER 600 MG 12 hr tablet Take 600 mg by mouth 2 (two) times daily. 01/03/2021: Reports taking as needed   HYDROmorphone (DILAUDID) 2 MG tablet Take 1 tablet (2 mg total) by mouth every 6 (six) hours as needed. FOR CHRONIC LOW BACK PAIN    ipratropium-albuterol (DUONEB) 0.5-2.5 (3) MG/3ML SOLN USE 1 VIAL VIA NEBULIZER AND INHALE BY MOUTH INTO THE LUNGS EVERY 6 HOURS AS NEEDED    levothyroxine (SYNTHROID) 75 MCG tablet Take 1 tablet (75 mcg total) by mouth daily. 12/16/2021: REPORTS DOSE IS 50 MCG DAILY   lisinopril (ZESTRIL) 5 MG tablet Take 5 mg by mouth daily.    magnesium oxide (MAG-OX) 400 (241.3 Mg) MG tablet Take 1 tablet by mouth every Monday,  Wednesday, and Friday.    nitroGLYCERIN (NITROSTAT) 0.4 MG SL tablet Place under the tongue.    pantoprazole (PROTONIX) 40 MG tablet Take 40 mg by mouth 2 (two) times daily.    potassium chloride (KLOR-CON) 10 MEQ tablet Take 10 mEq by mouth every Monday, Wednesday, and Friday. Take 2 tablets every Monday Wednesday Friday 08/25/2021: PRN with fluid pill   sacubitril-valsartan (ENTRESTO) 97-103 MG Take 1 tablet by mouth 2 (two) times daily.    spironolactone (ALDACTONE) 25 MG tablet Take 25 mg by mouth daily.     tiZANidine (ZANAFLEX) 2 MG tablet TAKE ONE TABLET BY MOUTH EVERY 6 HOURS AS NEEDED FOR MUSCLE SPASM    torsemide (DEMADEX) 20 MG tablet Take 20 mg by mouth daily. 08/25/2021: PRN   traZODone (DESYREL) 50 MG tablet TAKE 1/2 TO 1 TABLET BY MOUTH EVERY EVENING 1 HOUR BEFORE BEDTIME    TRELEGY ELLIPTA 100-62.5-25 MCG/INH AEPB INHALE 1 PUFF BY MOUTH INTO THE LUNGS EVERY DAY    warfarin (COUMADIN) 2 MG tablet Take 4 mg by mouth at bedtime. Every day except on Thursday take 8mg .    No facility-administered encounter medications on file as of 12/23/2021.    Patient Active Problem List   Diagnosis Date Noted   Low back pain without sciatica 08/25/2021   Osteoporosis with current pathological fracture 08/25/2021   Muscle strain of left upper back 07/22/2021   Sequela, post-stroke 07/22/2021   S/P split thickness skin graft 07/11/2021   Hematoma 47/42/5956   Embolic stroke ([place]) 38/75/6433   Brachial artery occlusion, left ([place]) 06/02/2021   History of embolectomy 06/02/2021   History of fasciotomy 06/02/2021   Left arm pain 06/02/2021   Malignant neoplasm of colon, unspecified part of colon ([place]) 05/06/2021   Neuropathy 05/06/2021   PAF (paroxysmal atrial fibrillation) ([place]) 02/10/2021   Palpitation 02/10/2021   Red blood cell antibody positive 12/27/2020   Sternal wound infection 12/23/2020   Anticoagulation goal of INR 2 to 3 12/23/2020   LVAD (left ventricular assist device) present ([place]) 29/51/8841   Complication involving left ventricular assist device (LVAD) 12/23/2020   Hypothyroidism 12/23/2020   History of gastrointestinal bleeding 10/18/2020   Left ventricular assist device (LVAD) complication, initial encounter 09/23/2020   Infection following procedure 09/03/2020   Chronic anticoagulation 08/03/2020   S/P MVR (mitral valve replacement) 07/16/2020   PHT (pulmonary hypertension) ([place]) 07/07/2020   Snoring 07/05/2020   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 ([place]) 07/05/2020   Ischemic cardiomyopathy 01/02/2020   CKD (chronic kidney disease), stage IIIa 12/31/2019   Pulmonary edema cardiac cause ([place]) 12/25/2019   Hyperlipidemia    Gouty arthritis of both feet 11/20/2019   Mitral stenosis with insufficiency    Hypertensive urgency    Depression    Hyperglycemia 09/22/2019   Acute respiratory failure with hypoxia ([place]) 09/22/2019   Presence of permanent cardiac pacemaker    Acute blood loss anemia    Chronic kidney disease    Hypertension    CHF (congestive heart failure) ([place]) 07/16/2019   Insomnia 05/07/2019   Suspected COVID-19 virus infection 03/23/2019   AVM (arteriovenous malformation) of small bowel, acquired    Anemia, iron deficiency 11/10/2018   Rectal polyp    Benign neoplasm of cecum    Barrett's esophagus without dysplasia    Chronic diastolic heart failure ([place]) 07/03/2018   HTN (hypertension) 07/03/2018   COPD with emphysema ([place]) 06/28/2018   B12 deficiency anemia 06/28/2018   Personal  history of colon cancer    Polyp of sigmoid colon    Diverticulosis of large intestine without diverticulitis    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   Renal artery stenosis ([place]) 10/03/2016   Encounter for assessment of implantable cardioverter-defibrillator (ICD) 05/06/2015   Failure of implantable cardioverter-defibrillator (ICD) lead 02/09/2015   Hospital discharge follow-up 02/09/2015   Cardiac resynchronization therapy defibrillator (CRT-D) in place 01/09/2015   Peripheral artery disease ([place]) 01/05/2015   Tobacco abuse 10/11/2014   S/P CABG x 3 07/03/2014   MI (myocardial infarction) ([place]) 07/03/2014   Atherosclerosis of native artery of extremity with intermittent claudication ([place]) 06/18/2013   Preoperative evaluation to rule out surgical contraindication 06/18/2013   CAD (coronary artery disease) 06/01/2013   GERD (gastroesophageal reflux disease) 06/01/2013   Hypercholesterolemia 06/01/2013   Tubular adenoma of  colon 06/01/2013   Major depressive disorder in remission ([place]) 06/01/2013    Conditions to be addressed/monitored:CHF and COPD  Care Plan : [place] (Adult)  Updates made by Leona Singleton, RN since 12/25/2021 12:00 AM     Problem: Knowledge defucuet related to self care managment of chronic  medical conditions   Priority: High     Long-Range Goal: Patient will work with CCM staff to learn self care skills to manage x   Start Date: 09/19/2021  Expected End Date: 09/19/2022  Recent Progress: Not on track  Priority: High  Note:   Current Barriers:  Knowledge Deficits related to plan of care for management of CHF, COPD, and Anemia/GI Bleed, and Spinal Fractures   Care Coordination needs related to Limited social support and Level of care concerns  Patient with recent discharge on 11/10/21 from [facility] related to anemia/GI bleed.  Received several blood transfusions; unsure of definite source of anemia/? GI bleed.  Was recovering from recent hospitalization for arm thrombectomy/fasciotomy.  Patient reporting wounds/incisions are healed EXCEPT large scab home health is adressing.  Diagnosed with spinal fracture post last hospitalization, repots back pains are much better.  Since discharge patient stating she is feeling weak and tired, a little short of  breath.  Weight was 143.9 pounds.  Have not been able to check BP yet.  States she has had to use her rescue nebulizer about 1 time in the past  3 days.  Encouraged patient to contact provider is still with increased need of nebulizer.  Denies any swelling.  Home Health ordered at discharge but has not made contact with patient as of yet.  No LVAD alarms.  States she will be checkining her INR and sending to Cardiology.  Has post hosp appointment with Cardiology on 1/25 (encouraged to keep and attend)  1/11-States she Is doing well.  Denies short of breath or dizziness, have not needed use rescue inhaler.  Back pain is managable.   Weight 138 this morning.  Was unable to check iNR using home machine (agency checking); home healh nurse able to check  1/23--Patient stating she is doing well, feels good.  States she was able to sit up most of day yesterday and have family christmas celebration.  Weight this morning was 140.  Denies any chest pain, shortness of breath, or lower extremity edema.  Denies need to use nebulizer or rescue inhaler.  States LVAD did alarm few times but cleared before she could assess machine.  2/10--Hospital admission at [facility] 1/27-2/9 with low flow LVAD alarms, anemia & cardiac arrest times 2.  Home since yesterday.  States she is very  sore, did not get sleep last night.  Reports she has 7 broken ribs.  Short of breath laying  and then start to cough. Did not weigh this morning.  Have not heard from home health.  Needs pain medicine refilled, encouraged to call PCP.  2/17--has attended post hospital appointment and received pain medication.  States pain is much better controlled.  Afraid to take pain medicine throughout the day due to sleepiness.  Discussed importance of pain management with coupghing and deep breathing,  Denies any increase in SOB.  Denies any LVAD alarms.  Diarrea has gotten better.  Denies needing rescue inhaler.  Has obtained new INR machine  RNCM Clinical Goal(s):  Patient will verbalize understanding of plan for management of CHF, COPD, and Anemia and Spinal fractures as evidenced by daily weight monitoring, pain management, COPD/Heart failure action plan, monitoring INR levels as ordered verbalize basic understanding of CHF, COPD, and Anemia and Spinal Fractures disease process and self health management plan as evidenced by working with therapy, increasing activity as tolerated, following action plans demonstrate improved health management independence as evidenced by close communication with providers, making sure INR meter stays in working order; knowing when to call provider based  on weight and shortness of breath, LVAD monitoring/alert monitoring, monitoring self for signs and symptoms of anemia        not experience hospital admission as evidenced by review of EMR. Hospital Admissions in last 6 months = 2  through collaboration with RN Care manager, provider, and care team.   Interventions: 1:1 collaboration with primary care provider regarding development and update of comprehensive plan of care as evidenced by provider attestation and co-signature Inter-disciplinary care team collaboration (see longitudinal plan of care) Evaluation of current treatment plan related to  self management and patient's adherence to plan as established by provider Verified patient has number to home health agency; [facility] contacted home health [facility]  [phone]    Anemia/Bleeding:  (Status: Goal on track: NO.) Short Term Goal  Assessment of understanding of anemia/bleeding disorder diagnosis Basic overview and discussion of anemia/bleeding disorder or acute disease state Medications reviewed - Counseled on bleeding risk associated with coumadin therapy and importance of self-monitoring for signs/symptoms of bleeding; - Counseled on avoidance of NSAIDs due to increased bleeding risk; - Counseled on importance of regular laboratory monitoring as directed by provider; - Provided education about signs and symptoms of active bleeding such as stomach discomfort, coughing up blood or blood tinged secretions, bleeding from the gums/teeth, nosebleeds, increased bruising, blood in the urine/stool and/or if a traumatic injury occurs, regardless of severity of injury;  - Advised to call provider or 911 if active bleeding or signs and symptoms of active bleeding occur; - recommended promotion of rest and energy-conserving measures to manage fatigue, such as balancing activity with periods of rest - encouraged strategies to prevent falls related to fatigue, weakness and dizziness; encouraged sitting  before standing and using an assistive device - encouraged optimal oral intake to support fluid balance and nutrition Continue to monitor yourself for low hemoglobin (feeling tired, exhausted, pale, shortness of breath, lack tarry stools)--SEEK MEDICAL ATTENTION RIGHT AWAY Verified has new working INR machine  Heart Failure Interventions:  (Status: Goal on track: NO.)  Long Term Goal  Basic overview and discussion of pathophysiology of Heart Failure reviewed Provided education on low sodium diet Reviewed Heart Failure Action Plan in depth and provided written copy Advised patient to weigh each morning after emptying bladder Discussed importance of daily  weight and advised patient to weigh and record daily Discussed the importance of keeping all appointments with provider Discussed daily check list and encouraged patient to complete; reinforced use of checklist Verified patient recieved [facility] Calendar booklet to help document vitals  COPD: (Status: Goal on Track (progressing): YES.) Long Term Goal  Reviewed medications with patient, including use of prescribed maintenance and rescue inhalers, and provided instruction on medication management and the importance of adherence Provided patient with basic written and verbal COPD education on self care/management/and exacerbation prevention Advised patient to track and manage COPD triggers Provided instruction about proper use of medications used for management of COPD including inhalers Advised patient to self assesses COPD action plan zone and make appointment with provider if in the yellow zone for 48 hours without improvement Discussed the importance of adequate rest and management of fatigue with COPD Encouraged to discuss increase SOB (if it happens) with PCP as soon as possible  Pain:  (Status: Goal on track: NO.) Short Term Goal  Pain assessment performed Medications reviewed Reviewed provider established plan for pain  management; Discussed importance of adherence to all scheduled medical appointments; Counseled on the importance of reporting any/all new or changed pain symptoms or management strategies to pain management provider; Advised patient to report to care team affect of pain on daily activities; Discussed use of relaxation techniques and/or diversional activities to assist with pain reduction (distraction, imagery, relaxation, massage, acupressure, TENS, heat, and cold application; Reviewed with patient prescribed pharmacological and nonpharmacological pain relief strategies; Advised patient to discuss continued, mobility limiting pain and increase shortness of breath  with provider;  Patient Goals/Self-Care Activities: Patient will self administer medications as prescribed as evidenced by self report/primary caregiver report  Patient will attend all scheduled provider appointments as evidenced by clinician review of documented attendance to scheduled appointments and patient/caregiver report Patient will call provider office for new concerns or questions as evidenced by review of documented incoming telephone call notes and patient report call office if I gain more than 2 pounds in one day or 5 pounds in one week keep legs up while sitting track weight in diary use salt in moderation weigh myself daily follow rescue plan if symptoms flare-up track symptoms and what helps feel better or worse - identify and remove indoor air pollutants - limit outdoor activity during cold weather - eliminate symptom triggers at home - follow rescue plan if symptoms flare-up Contact provider for increase use of rescue inhaler/nebulizer (use rescue inhaler and nebulizer for increase in shortness of breath) Call LVAD team for increase in shortness of breath on day 1 or 2 instead of waiting days later when it is worse Patient is to contact PCP for post hosp appointment as soon as possible  Call LVAD team for any  LVAD alarms        Plan:The care management team will reach out to the patient again over the next 10 days.  Hubert Azure RN, MSN RN Care Management Coordinator [place] [phone] [email or web address]

## 2021-12-26 ENCOUNTER — Telehealth: Payer: Self-pay

## 2021-12-26 DIAGNOSIS — D649 Anemia, unspecified: Secondary | ICD-10-CM | POA: Diagnosis not present

## 2021-12-26 DIAGNOSIS — K922 Gastrointestinal hemorrhage, unspecified: Secondary | ICD-10-CM | POA: Diagnosis not present

## 2021-12-26 DIAGNOSIS — S51802D Unspecified open wound of left forearm, subsequent encounter: Secondary | ICD-10-CM | POA: Diagnosis not present

## 2021-12-26 DIAGNOSIS — I10 Essential (primary) hypertension: Secondary | ICD-10-CM | POA: Diagnosis not present

## 2021-12-26 DIAGNOSIS — N183 Chronic kidney disease, stage 3 unspecified: Secondary | ICD-10-CM | POA: Diagnosis not present

## 2021-12-26 DIAGNOSIS — J449 Chronic obstructive pulmonary disease, unspecified: Secondary | ICD-10-CM | POA: Diagnosis not present

## 2021-12-26 DIAGNOSIS — I5022 Chronic systolic (congestive) heart failure: Secondary | ICD-10-CM | POA: Diagnosis not present

## 2021-12-26 DIAGNOSIS — I739 Peripheral vascular disease, unspecified: Secondary | ICD-10-CM | POA: Diagnosis not present

## 2021-12-26 DIAGNOSIS — M109 Gout, unspecified: Secondary | ICD-10-CM | POA: Diagnosis not present

## 2021-12-26 DIAGNOSIS — E1122 Type 2 diabetes mellitus with diabetic chronic kidney disease: Secondary | ICD-10-CM | POA: Diagnosis not present

## 2021-12-26 NOTE — Telephone Encounter (Signed)
° ° °  Telephone encounter was:  Successful.  12/26/2021 Name: Ashley [name] MRN: [id number] DOB: 04-19-1951  Ashley [name] is a 71 y.o. year old female who is a primary care patient of Derrel Nip, [name], MD . The community resource team was consulted for assistance with Transportation Needs   Care guide performed the following interventions: Patient provided with information about care guide support team and interviewed to confirm resource needs.Patient and I called and got her transportation contact information together from insurance. Patient stated she could call on her own to set up transportation needs  Follow Up Plan: No follow up is needed    [facility]  [phone] [address], [place], [place] [address] Phone: [phone] Email: [email or web address]

## 2021-12-28 DIAGNOSIS — I5022 Chronic systolic (congestive) heart failure: Secondary | ICD-10-CM | POA: Diagnosis not present

## 2021-12-28 DIAGNOSIS — S51802D Unspecified open wound of left forearm, subsequent encounter: Secondary | ICD-10-CM | POA: Diagnosis not present

## 2021-12-28 DIAGNOSIS — M109 Gout, unspecified: Secondary | ICD-10-CM | POA: Diagnosis not present

## 2021-12-28 DIAGNOSIS — K922 Gastrointestinal hemorrhage, unspecified: Secondary | ICD-10-CM | POA: Diagnosis not present

## 2021-12-28 DIAGNOSIS — J449 Chronic obstructive pulmonary disease, unspecified: Secondary | ICD-10-CM | POA: Diagnosis not present

## 2021-12-28 DIAGNOSIS — I10 Essential (primary) hypertension: Secondary | ICD-10-CM | POA: Diagnosis not present

## 2021-12-28 DIAGNOSIS — I739 Peripheral vascular disease, unspecified: Secondary | ICD-10-CM | POA: Diagnosis not present

## 2021-12-28 DIAGNOSIS — N183 Chronic kidney disease, stage 3 unspecified: Secondary | ICD-10-CM | POA: Diagnosis not present

## 2021-12-28 DIAGNOSIS — D649 Anemia, unspecified: Secondary | ICD-10-CM | POA: Diagnosis not present

## 2021-12-28 DIAGNOSIS — E1122 Type 2 diabetes mellitus with diabetic chronic kidney disease: Secondary | ICD-10-CM | POA: Diagnosis not present

## 2021-12-29 DIAGNOSIS — M109 Gout, unspecified: Secondary | ICD-10-CM | POA: Diagnosis not present

## 2021-12-29 DIAGNOSIS — S51802D Unspecified open wound of left forearm, subsequent encounter: Secondary | ICD-10-CM | POA: Diagnosis not present

## 2021-12-29 DIAGNOSIS — D649 Anemia, unspecified: Secondary | ICD-10-CM | POA: Diagnosis not present

## 2021-12-29 DIAGNOSIS — I739 Peripheral vascular disease, unspecified: Secondary | ICD-10-CM | POA: Diagnosis not present

## 2021-12-29 DIAGNOSIS — E1122 Type 2 diabetes mellitus with diabetic chronic kidney disease: Secondary | ICD-10-CM | POA: Diagnosis not present

## 2021-12-29 DIAGNOSIS — J449 Chronic obstructive pulmonary disease, unspecified: Secondary | ICD-10-CM | POA: Diagnosis not present

## 2021-12-29 DIAGNOSIS — I5022 Chronic systolic (congestive) heart failure: Secondary | ICD-10-CM | POA: Diagnosis not present

## 2021-12-29 DIAGNOSIS — N183 Chronic kidney disease, stage 3 unspecified: Secondary | ICD-10-CM | POA: Diagnosis not present

## 2021-12-29 DIAGNOSIS — I10 Essential (primary) hypertension: Secondary | ICD-10-CM | POA: Diagnosis not present

## 2021-12-29 DIAGNOSIS — K922 Gastrointestinal hemorrhage, unspecified: Secondary | ICD-10-CM | POA: Diagnosis not present

## 2021-12-30 ENCOUNTER — Ambulatory Visit: Payer: Medicare Other | Admitting: *Deleted

## 2021-12-30 DIAGNOSIS — M109 Gout, unspecified: Secondary | ICD-10-CM | POA: Diagnosis not present

## 2021-12-30 DIAGNOSIS — I5022 Chronic systolic (congestive) heart failure: Secondary | ICD-10-CM | POA: Diagnosis not present

## 2021-12-30 DIAGNOSIS — Z95811 Presence of heart assist device: Secondary | ICD-10-CM | POA: Diagnosis not present

## 2021-12-30 DIAGNOSIS — I5032 Chronic diastolic (congestive) heart failure: Secondary | ICD-10-CM

## 2021-12-30 DIAGNOSIS — N183 Chronic kidney disease, stage 3 unspecified: Secondary | ICD-10-CM | POA: Diagnosis not present

## 2021-12-30 DIAGNOSIS — E1142 Type 2 diabetes mellitus with diabetic polyneuropathy: Secondary | ICD-10-CM

## 2021-12-30 DIAGNOSIS — D649 Anemia, unspecified: Secondary | ICD-10-CM | POA: Diagnosis not present

## 2021-12-30 DIAGNOSIS — J449 Chronic obstructive pulmonary disease, unspecified: Secondary | ICD-10-CM | POA: Diagnosis not present

## 2021-12-30 DIAGNOSIS — I10 Essential (primary) hypertension: Secondary | ICD-10-CM | POA: Diagnosis not present

## 2021-12-30 DIAGNOSIS — K922 Gastrointestinal hemorrhage, unspecified: Secondary | ICD-10-CM | POA: Diagnosis not present

## 2021-12-30 DIAGNOSIS — E1122 Type 2 diabetes mellitus with diabetic chronic kidney disease: Secondary | ICD-10-CM | POA: Diagnosis not present

## 2021-12-30 DIAGNOSIS — Z48812 Encounter for surgical aftercare following surgery on the circulatory system: Secondary | ICD-10-CM | POA: Diagnosis not present

## 2021-12-30 DIAGNOSIS — D62 Acute posthemorrhagic anemia: Secondary | ICD-10-CM

## 2021-12-30 DIAGNOSIS — Z4801 Encounter for change or removal of surgical wound dressing: Secondary | ICD-10-CM | POA: Diagnosis not present

## 2021-12-30 DIAGNOSIS — S51802D Unspecified open wound of left forearm, subsequent encounter: Secondary | ICD-10-CM | POA: Diagnosis not present

## 2021-12-30 DIAGNOSIS — I428 Other cardiomyopathies: Secondary | ICD-10-CM | POA: Diagnosis not present

## 2021-12-30 DIAGNOSIS — I739 Peripheral vascular disease, unspecified: Secondary | ICD-10-CM | POA: Diagnosis not present

## 2022-01-01 NOTE — Chronic Care Management (AMB) (Signed)
Chronic Care Management   CCM RN Visit Note  01/01/2022 Name: Ashley [name] MRN: [id number] DOB: [birth year]  Subjective: Ashley [name] is a 71 y.o. year old female who is a primary care patient of [name], MD. The care management team was consulted for assistance with disease management and care coordination needs.    Engaged with patient by telephone for follow up visit in response to provider referral for case management and/or care coordination services.   Consent to Services:  The patient was given information about Chronic Care Management services, agreed to services, and gave verbal consent prior to initiation of services.  Please see initial visit note for detailed documentation.   Patient agreed to services and verbal consent obtained.   Assessment: Review of patient past medical history, allergies, medications, health status, including review of consultants reports, laboratory and other test data, was performed as part of comprehensive evaluation and provision of chronic care management services.   SDOH (Social Determinants of Health) assessments and interventions performed:    CCM Care Plan  Allergies  Allergen Reactions   Oxycodone     Other reaction(s): Hallucination Hallucinations to oxycodone noted while inpatient 06/2021. Tolerates hydromorphone.    Vancomycin Nausea And Vomiting and Palpitations   Hydrocodone-Acetaminophen Nausea Only    Outpatient Encounter Medications as of 12/30/2021  Medication Sig Note   acetaminophen (TYLENOL) 325 MG tablet Take 650 mg by mouth every 6 (six) hours as needed.    albuterol (PROAIR HFA) 108 (90 Base) MCG/ACT inhaler Inhale 1-2 puffs into the lungs every 6 (six) hours as needed for wheezing or shortness of breath.    allopurinol (ZYLOPRIM) 100 MG tablet TAKE ONE TABLET BY MOUTH ONCE DAILY    amitriptyline (ELAVIL) 25 MG tablet Take 1 tablet (25 mg total) by mouth at bedtime.    amLODipine (NORVASC) 2.5 MG tablet  Take 2.5 mg by mouth daily.    atorvastatin (LIPITOR) 40 MG tablet TAKE ONE TABLET BY MOUTH ONCE DAILY    carvedilol (COREG) 12.5 MG tablet SMARTSIG:1 Tablet(s) By Mouth Every 12 Hours    carvedilol (COREG) 25 MG tablet Take 25 mg by mouth 2 (two) times daily. (Patient not taking: Reported on 12/21/2021)    colchicine 0.6 MG tablet  01/03/2021: Reports taking only when gout flares up   escitalopram (LEXAPRO) 10 MG tablet TAKE ONE TABLET BY MOUTH ONCE DAILY    ferrous sulfate 325 (65 FE) MG tablet Take 325 mg by mouth daily with breakfast.    fluticasone (FLONASE) 50 MCG/ACT nasal spray Place 1 spray into both nostrils as needed for allergies or rhinitis.    folic acid (FOLVITE) 1 MG tablet Take 1 tablet (1 mg total) by mouth daily.    GNP MUCUS ER 600 MG 12 hr tablet Take 600 mg by mouth 2 (two) times daily. 01/03/2021: Reports taking as needed   HYDROmorphone (DILAUDID) 2 MG tablet Take 1 tablet (2 mg total) by mouth every 6 (six) hours as needed. FOR CHRONIC LOW BACK PAIN    ipratropium-albuterol (DUONEB) 0.5-2.5 (3) MG/3ML SOLN USE 1 VIAL VIA NEBULIZER AND INHALE BY MOUTH INTO THE LUNGS EVERY 6 HOURS AS NEEDED    levothyroxine (SYNTHROID) 75 MCG tablet Take 1 tablet (75 mcg total) by mouth daily. 12/16/2021: REPORTS DOSE IS 50 MCG DAILY   lisinopril (ZESTRIL) 5 MG tablet Take 5 mg by mouth daily.    magnesium oxide (MAG-OX) 400 (241.3 Mg) MG tablet Take 1 tablet by mouth every Monday,  Wednesday, and Friday.    nitroGLYCERIN (NITROSTAT) 0.4 MG SL tablet Place under the tongue.    pantoprazole (PROTONIX) 40 MG tablet Take 40 mg by mouth 2 (two) times daily.    potassium chloride (KLOR-CON) 10 MEQ tablet Take 10 mEq by mouth every Monday, Wednesday, and Friday. Take 2 tablets every Monday Wednesday Friday 08/25/2021: PRN with fluid pill   sacubitril-valsartan (ENTRESTO) 97-103 MG Take 1 tablet by mouth 2 (two) times daily.    spironolactone (ALDACTONE) 25 MG tablet Take 25 mg by mouth daily.     tiZANidine (ZANAFLEX) 2 MG tablet TAKE ONE TABLET BY MOUTH EVERY 6 HOURS AS NEEDED FOR MUSCLE SPASM    torsemide (DEMADEX) 20 MG tablet Take 20 mg by mouth daily. 08/25/2021: PRN   traZODone (DESYREL) 50 MG tablet TAKE 1/2 TO 1 TABLET BY MOUTH EVERY EVENING 1 HOUR BEFORE BEDTIME    TRELEGY ELLIPTA 100-62.5-25 MCG/INH AEPB INHALE 1 PUFF BY MOUTH INTO THE LUNGS EVERY DAY    warfarin (COUMADIN) 2 MG tablet Take 4 mg by mouth at bedtime. Every day except on Thursday take 8mg .    No facility-administered encounter medications on file as of 12/30/2021.    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   History of embolectomy 06/02/2021   History of fasciotomy 06/02/2021   Left arm pain 06/02/2021   Malignant neoplasm of colon, unspecified part of colon ([place]) 05/06/2021   Neuropathy 05/06/2021   PAF (paroxysmal atrial fibrillation) ([place]) 02/10/2021   Palpitation 02/10/2021   Red blood cell antibody positive 12/27/2020   Sternal wound infection 12/23/2020   Anticoagulation goal of INR 2 to 3 12/23/2020   LVAD (left ventricular assist device) present ([place]) 17/49/4496   Complication involving left ventricular assist device (LVAD) 12/23/2020   Hypothyroidism 12/23/2020   History of gastrointestinal bleeding 10/18/2020   Left ventricular assist device (LVAD) complication, initial encounter 09/23/2020   Infection following procedure 09/03/2020   Chronic anticoagulation 08/03/2020   S/P MVR (mitral valve replacement) 07/16/2020   PHT (pulmonary hypertension) ([place]) 07/07/2020   Snoring 07/05/2020   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 ([place]) 07/05/2020   Ischemic cardiomyopathy 01/02/2020   CKD (chronic kidney disease), stage IIIa 12/31/2019   Pulmonary edema cardiac cause ([place]) 12/25/2019   Hyperlipidemia    Gouty arthritis of both feet 11/20/2019   Mitral stenosis with insufficiency    Hypertensive urgency    Depression    Hyperglycemia 09/22/2019   Acute respiratory failure with hypoxia ([place]) 09/22/2019   Presence of permanent cardiac pacemaker    Acute blood loss anemia    Chronic kidney disease    Hypertension    CHF (congestive heart failure) ([place]) 07/16/2019   Insomnia 05/07/2019   Suspected COVID-19 virus infection 03/23/2019   AVM (arteriovenous malformation) of small bowel, acquired    Anemia, iron deficiency 11/10/2018   Rectal polyp    Benign neoplasm of cecum    Barrett's esophagus without dysplasia    Chronic diastolic heart failure ([place]) 07/03/2018   HTN (hypertension) 07/03/2018   COPD with emphysema ([place]) 06/28/2018   B12 deficiency anemia 06/28/2018   Personal  history of colon cancer    Polyp of sigmoid colon    Diverticulosis of large intestine without diverticulitis    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   Renal artery stenosis ([place]) 10/03/2016   Encounter for assessment of implantable cardioverter-defibrillator (ICD) 05/06/2015   Failure of implantable cardioverter-defibrillator (ICD) lead 02/09/2015   Hospital discharge follow-up 02/09/2015   Cardiac resynchronization therapy defibrillator (CRT-D) in place 01/09/2015   Peripheral artery disease ([place]) 01/05/2015   Tobacco abuse 10/11/2014   S/P CABG x 3 07/03/2014   MI (myocardial infarction) ([place]) 07/03/2014   Atherosclerosis of native artery of extremity with intermittent claudication ([place]) 06/18/2013   Preoperative evaluation to rule out surgical contraindication 06/18/2013   CAD (coronary artery disease) 06/01/2013   GERD (gastroesophageal reflux disease) 06/01/2013   Hypercholesterolemia 06/01/2013   Tubular adenoma of  colon 06/01/2013   Major depressive disorder in remission ([place]) 06/01/2013    Conditions to be addressed/monitored:CHF, HTN, COPD, and DMII  Care Plan : [place] (Adult)  Updates made by Leona Singleton, RN since 01/01/2022 12:00 AM     Problem: Knowledge defucuet related to self care managment of chronic  medical conditions   Priority: High     Long-Range Goal: Patient will work with CCM staff to learn self care skills to manage x   Start Date: 09/19/2021  Expected End Date: 09/19/2022  Recent Progress: Not on track  Priority: High  Note:   Current Barriers:  Knowledge Deficits related to plan of care for management of CHF, COPD, and Anemia/GI Bleed, and Spinal Fractures   Care Coordination needs related to Limited social support and Level of care concerns  Patient with recent discharge on 11/10/21 from [facility] related to anemia/GI bleed.  Received several blood transfusions; unsure of definite source of anemia/? GI bleed.  Was recovering from recent hospitalization for arm thrombectomy/fasciotomy.  Patient reporting wounds/incisions are healed EXCEPT large scab home health is adressing.  Diagnosed with spinal fracture post last hospitalization, repots back pains are much better.  Since discharge patient stating she is feeling weak and tired, a little short of  breath.  Weight was 143.9 pounds.  Have not been able to check BP yet.  States she has had to use her rescue nebulizer about 1 time in the past  3 days.  Encouraged patient to contact provider is still with increased need of nebulizer.  Denies any swelling.  Home Health ordered at discharge but has not made contact with patient as of yet.  No LVAD alarms.  States she will be checkining her INR and sending to Cardiology.  Has post hosp appointment with Cardiology on 1/25 (encouraged to keep and attend)  1/11-States she Is doing well.  Denies short of breath or dizziness, have not needed use rescue inhaler.  Back pain is  managable.  Weight 138 this morning.  Was unable to check iNR using home machine (agency checking); home healh nurse able to check  1/23--Patient stating she is doing well, feels good.  States she was able to sit up most of day yesterday and have family christmas celebration.  Weight this morning was 140.  Denies any chest pain, shortness of breath, or lower extremity edema.  Denies need to use nebulizer or rescue inhaler.  States LVAD did alarm few times but cleared before she could assess machine.  2/10--Hospital admission at [facility] 1/27-2/9 with low flow LVAD alarms, anemia & cardiac arrest times 2.  Home since yesterday.  States she  is very sore, did not get sleep last night.  Reports she has 7 broken ribs.  Short of breath laying and then start to cough. Did not weigh this morning.  Have not heard from home health.  Needs pain medicine refilled, encouraged to call PCP.  2/17--has attended post hospital appointment and received pain medication.  States pain is much better controlled.  Afraid to take pain medicine throughout the day due to sleepiness.  Discussed importance of pain management with coupghing and deep breathing,  Denies any increase in SOB.  Denies any LVAD alarms.  Diarrea has gotten better.  Denies needing rescue inhaler.  Has obtained new INR machine  2/24--Continues to be in large amounts of pain from broken ribs.  Uses pain meds mostly at night as she does not like to sleep a lot during the day.  Discused taking lower dose pain med, taking tylenol throughout the day to help managing pain, and the importance of pain management in prevention of pnemonia.  Denies increase in SOB and states she has not needed rescue inha;er/neb in the past few weeks.  Weight stable at 140 pounds.  Denies blood in stool.  Does report LVAD alarming once, states she drank water and alarm (low flow) stopped; instructed patient to call cardiology concerning alarm.    RNCM Clinical Goal(s):  Patient will  verbalize understanding of plan for management of CHF, COPD, and Anemia and Spinal fractures as evidenced by daily weight monitoring, pain management, COPD/Heart failure action plan, monitoring INR levels as ordered verbalize basic understanding of CHF, COPD, and Anemia and Spinal Fractures disease process and self health management plan as evidenced by working with therapy, increasing activity as tolerated, following action plans demonstrate improved health management independence as evidenced by close communication with providers, making sure INR meter stays in working order; knowing when to call provider based on weight and shortness of breath, LVAD monitoring/alert monitoring, monitoring self for signs and symptoms of anemia        not experience hospital admission as evidenced by review of EMR. Hospital Admissions in last 6 months = 4  through collaboration with RN Care manager, provider, and care team.   Interventions: 1:1 collaboration with primary care provider regarding development and update of comprehensive plan of care as evidenced by provider attestation and co-signature Inter-disciplinary care team collaboration (see longitudinal plan of care) Evaluation of current treatment plan related to  self management and patient's adherence to plan as established by provider Verified patient has number to home health agency; [facility] contacted home health [facility]  [phone]    Anemia/Bleeding:  (Status: Goal on track: NO.) Short Term Goal  Assessment of understanding of anemia/bleeding disorder diagnosis Basic overview and discussion of anemia/bleeding disorder or acute disease state Medications reviewed - Counseled on bleeding risk associated with coumadin therapy and importance of self-monitoring for signs/symptoms of bleeding; - Counseled on avoidance of NSAIDs due to increased bleeding risk; - Counseled on importance of regular laboratory monitoring as directed by provider; - Provided  education about signs and symptoms of active bleeding such as stomach discomfort, coughing up blood or blood tinged secretions, bleeding from the gums/teeth, nosebleeds, increased bruising, blood in the urine/stool and/or if a traumatic injury occurs, regardless of severity of injury;  - Advised to call provider or 911 if active bleeding or signs and symptoms of active bleeding occur; - recommended promotion of rest and energy-conserving measures to manage fatigue, such as balancing activity with periods of rest - encouraged strategies  to prevent falls related to fatigue, weakness and dizziness; encouraged sitting before standing and using an assistive device - encouraged optimal oral intake to support fluid balance and nutrition Continue to monitor yourself for low hemoglobin (feeling tired, exhausted, pale, shortness of breath, lack tarry stools)--SEEK MEDICAL ATTENTION RIGHT AWAY Verified has new working INR machine  Heart Failure Interventions:  (Status: Goal on Track (progressing): YES.)  Long Term Goal  Basic overview and discussion of pathophysiology of Heart Failure reviewed Provided education on low sodium diet Reviewed Heart Failure Action Plan in depth and provided written copy Advised patient to weigh each morning after emptying bladder Discussed importance of daily weight and advised patient to weigh and record daily Discussed the importance of keeping all appointments with provider Discussed daily check list and encouraged patient to complete; reinforced use of checklist Verified patient recieved [facility] Calendar booklet to help document vitals  COPD: (Status: Goal on Track (progressing): YES.) Long Term Goal  Reviewed medications with patient, including use of prescribed maintenance and rescue inhalers, and provided instruction on medication management and the importance of adherence Provided patient with basic written and verbal COPD education on self care/management/and exacerbation  prevention Advised patient to track and manage COPD triggers Provided instruction about proper use of medications used for management of COPD including inhalers Advised patient to self assesses COPD action plan zone and make appointment with provider if in the yellow zone for 48 hours without improvement Discussed the importance of adequate rest and management of fatigue with COPD Encouraged to do coughing and deep breathing exercises  Pain:  (Status: Goal on track: NO.) Short Term Goal  Pain assessment performed Medications reviewed Reviewed provider established plan for pain management; Discussed importance of adherence to all scheduled medical appointments; Counseled on the importance of reporting any/all new or changed pain symptoms or management strategies to pain management provider; Advised patient to report to care team affect of pain on daily activities; Discussed use of relaxation techniques and/or diversional activities to assist with pain reduction (distraction, imagery, relaxation, massage, acupressure, TENS, heat, and cold application; Reviewed with patient prescribed pharmacological and nonpharmacological pain relief strategies; Discussed the importance of pain control in relation to healing  Patient Goals/Self-Care Activities: Patient will self administer medications as prescribed as evidenced by self report/primary caregiver report  Patient will attend all scheduled provider appointments as evidenced by clinician review of documented attendance to scheduled appointments and patient/caregiver report Patient will call provider office for new concerns or questions as evidenced by review of documented incoming telephone call notes and patient report call office if I gain more than 2 pounds in one day or 5 pounds in one week keep legs up while sitting track weight in diary use salt in moderation weigh myself daily follow rescue plan if symptoms flare-up track symptoms and what  helps feel better or worse - identify and remove indoor air pollutants - limit outdoor activity during cold weather - eliminate symptom triggers at home - follow rescue plan if symptoms flare-up Contact provider for increase use of rescue inhaler/nebulizer (use rescue inhaler and nebulizer for increase in shortness of breath) Call LVAD team for increase in shortness of breath on day 1 or 2 instead of waiting days later when it is worse Patient is to contact PCP for post hosp appointment as soon as possible  Call LVAD team for any LVAD alarms/recent alarm ASAP     Plan:The care management team will reach out to the patient again over the next  20 days.  Hubert Azure RN, MSN RN Care Management Coordinator [place] [phone] [email or web address]

## 2022-01-01 NOTE — Patient Instructions (Addendum)
Visit Information  Thank you for taking time to visit with me today. Please don't hesitate to contact me if I can be of assistance to you before our next scheduled telephone appointment.  Following are the goals we discussed today:  Patient will self administer medications as prescribed as evidenced by self report/primary caregiver report  Patient will attend all scheduled provider appointments as evidenced by clinician review of documented attendance to scheduled appointments and patient/caregiver report Patient will call provider office for new concerns or questions as evidenced by review of documented incoming telephone call notes and patient report call office if I gain more than 2 pounds in one day or 5 pounds in one week keep legs up while sitting track weight in diary use salt in moderation weigh myself daily follow rescue plan if symptoms flare-up track symptoms and what helps feel better or worse - identify and remove indoor air pollutants - limit outdoor activity during cold weather - eliminate symptom triggers at home - follow rescue plan if symptoms flare-up Contact provider for increase use of rescue inhaler/nebulizer (use rescue inhaler and nebulizer for increase in shortness of breath) Call LVAD team for increase in shortness of breath on day 1 or 2 instead of waiting days later when it is worse Patient is to contact PCP for post hosp appointment as soon as possible  Call LVAD team for any LVAD alarms/recent alarm ASAP  Our next appointment is by telephone on 3/1 at 1445  Please call the care guide team at [phone] if you need to cancel or reschedule your appointment.   If you are experiencing a Mental Health or [place] or need someone to talk to, please call the Suicide and Crisis Lifeline: 988 call the [place] National Suicide Prevention Lifeline: 862-478-4491 or TTY: 934-527-9838 TTY (838)814-4439) to talk to a trained counselor call 1-800-273-TALK  (toll free, 24 hour hotline) call 911   Patient verbalizes understanding of instructions and care plan provided today and agrees to view in [place]. Active MyChart status confirmed with patient.    Hubert Azure RN, MSN RN Care Management Coordinator [place] [phone] [email or web address]

## 2022-01-03 DIAGNOSIS — J441 Chronic obstructive pulmonary disease with (acute) exacerbation: Secondary | ICD-10-CM | POA: Diagnosis not present

## 2022-01-03 DIAGNOSIS — I5032 Chronic diastolic (congestive) heart failure: Secondary | ICD-10-CM

## 2022-01-03 DIAGNOSIS — I5023 Acute on chronic systolic (congestive) heart failure: Secondary | ICD-10-CM | POA: Diagnosis not present

## 2022-01-03 DIAGNOSIS — D62 Acute posthemorrhagic anemia: Secondary | ICD-10-CM

## 2022-01-03 DIAGNOSIS — E1142 Type 2 diabetes mellitus with diabetic polyneuropathy: Secondary | ICD-10-CM | POA: Diagnosis not present

## 2022-01-04 ENCOUNTER — Ambulatory Visit (INDEPENDENT_AMBULATORY_CARE_PROVIDER_SITE_OTHER): Payer: Medicare Other | Admitting: *Deleted

## 2022-01-04 DIAGNOSIS — M109 Gout, unspecified: Secondary | ICD-10-CM | POA: Diagnosis not present

## 2022-01-04 DIAGNOSIS — I5022 Chronic systolic (congestive) heart failure: Secondary | ICD-10-CM | POA: Diagnosis not present

## 2022-01-04 DIAGNOSIS — K922 Gastrointestinal hemorrhage, unspecified: Secondary | ICD-10-CM | POA: Diagnosis not present

## 2022-01-04 DIAGNOSIS — D62 Acute posthemorrhagic anemia: Secondary | ICD-10-CM

## 2022-01-04 DIAGNOSIS — E1122 Type 2 diabetes mellitus with diabetic chronic kidney disease: Secondary | ICD-10-CM | POA: Diagnosis not present

## 2022-01-04 DIAGNOSIS — J449 Chronic obstructive pulmonary disease, unspecified: Secondary | ICD-10-CM | POA: Diagnosis not present

## 2022-01-04 DIAGNOSIS — S51802D Unspecified open wound of left forearm, subsequent encounter: Secondary | ICD-10-CM | POA: Diagnosis not present

## 2022-01-04 DIAGNOSIS — I739 Peripheral vascular disease, unspecified: Secondary | ICD-10-CM | POA: Diagnosis not present

## 2022-01-04 DIAGNOSIS — E1142 Type 2 diabetes mellitus with diabetic polyneuropathy: Secondary | ICD-10-CM

## 2022-01-04 DIAGNOSIS — D649 Anemia, unspecified: Secondary | ICD-10-CM | POA: Diagnosis not present

## 2022-01-04 DIAGNOSIS — N183 Chronic kidney disease, stage 3 unspecified: Secondary | ICD-10-CM | POA: Diagnosis not present

## 2022-01-04 DIAGNOSIS — I10 Essential (primary) hypertension: Secondary | ICD-10-CM | POA: Diagnosis not present

## 2022-01-04 DIAGNOSIS — I5032 Chronic diastolic (congestive) heart failure: Secondary | ICD-10-CM

## 2022-01-04 NOTE — Chronic Care Management (AMB) (Signed)
Chronic Care Management   CCM RN Visit Note  01/04/2022 Name: Ashley [name] MRN: [id number] DOB: 08/17/1951  Subjective: Ashley [name] is a 71 y.o. year old female who is a primary care patient of [name], MD. The care management team was consulted for assistance with disease management and care coordination needs.    Engaged with patient by telephone for follow up visit in response to provider referral for case management and/or care coordination services.   Consent to Services:  The patient was given information about Chronic Care Management services, agreed to services, and gave verbal consent prior to initiation of services.  Please see initial visit note for detailed documentation.   Patient agreed to services and verbal consent obtained.   Assessment: Review of patient past medical history, allergies, medications, health status, including review of consultants reports, laboratory and other test data, was performed as part of comprehensive evaluation and provision of chronic care management services.   SDOH (Social Determinants of Health) assessments and interventions performed:    CCM Care Plan  Allergies  Allergen Reactions   Oxycodone     Other reaction(s): Hallucination Hallucinations to oxycodone noted while inpatient 06/2021. Tolerates hydromorphone.    Vancomycin Nausea And Vomiting and Palpitations   Hydrocodone-Acetaminophen Nausea Only    Outpatient Encounter Medications as of 01/04/2022  Medication Sig Note   acetaminophen (TYLENOL) 325 MG tablet Take 650 mg by mouth every 6 (six) hours as needed.    albuterol (PROAIR HFA) 108 (90 Base) MCG/ACT inhaler Inhale 1-2 puffs into the lungs every 6 (six) hours as needed for wheezing or shortness of breath.    allopurinol (ZYLOPRIM) 100 MG tablet TAKE ONE TABLET BY MOUTH ONCE DAILY    amitriptyline (ELAVIL) 25 MG tablet Take 1 tablet (25 mg total) by mouth at bedtime.    amLODipine (NORVASC) 2.5 MG tablet Take  2.5 mg by mouth daily.    atorvastatin (LIPITOR) 40 MG tablet TAKE ONE TABLET BY MOUTH ONCE DAILY    carvedilol (COREG) 12.5 MG tablet SMARTSIG:1 Tablet(s) By Mouth Every 12 Hours    carvedilol (COREG) 25 MG tablet Take 25 mg by mouth 2 (two) times daily. (Patient not taking: Reported on 12/21/2021)    colchicine 0.6 MG tablet  01/03/2021: Reports taking only when gout flares up   escitalopram (LEXAPRO) 10 MG tablet TAKE ONE TABLET BY MOUTH ONCE DAILY    ferrous sulfate 325 (65 FE) MG tablet Take 325 mg by mouth daily with breakfast.    fluticasone (FLONASE) 50 MCG/ACT nasal spray Place 1 spray into both nostrils as needed for allergies or rhinitis.    folic acid (FOLVITE) 1 MG tablet Take 1 tablet (1 mg total) by mouth daily.    GNP MUCUS ER 600 MG 12 hr tablet Take 600 mg by mouth 2 (two) times daily. 01/03/2021: Reports taking as needed   HYDROmorphone (DILAUDID) 2 MG tablet Take 1 tablet (2 mg total) by mouth every 6 (six) hours as needed. FOR CHRONIC LOW BACK PAIN    ipratropium-albuterol (DUONEB) 0.5-2.5 (3) MG/3ML SOLN USE 1 VIAL VIA NEBULIZER AND INHALE BY MOUTH INTO THE LUNGS EVERY 6 HOURS AS NEEDED    levothyroxine (SYNTHROID) 75 MCG tablet Take 1 tablet (75 mcg total) by mouth daily. 12/16/2021: REPORTS DOSE IS 50 MCG DAILY   lisinopril (ZESTRIL) 5 MG tablet Take 5 mg by mouth daily.    magnesium oxide (MAG-OX) 400 (241.3 Mg) MG tablet Take 1 tablet by mouth every Monday,  Wednesday, and Friday.    nitroGLYCERIN (NITROSTAT) 0.4 MG SL tablet Place under the tongue.    pantoprazole (PROTONIX) 40 MG tablet Take 40 mg by mouth 2 (two) times daily.    potassium chloride (KLOR-CON) 10 MEQ tablet Take 10 mEq by mouth every Monday, Wednesday, and Friday. Take 2 tablets every Monday Wednesday Friday 08/25/2021: PRN with fluid pill   sacubitril-valsartan (ENTRESTO) 97-103 MG Take 1 tablet by mouth 2 (two) times daily.    spironolactone (ALDACTONE) 25 MG tablet Take 25 mg by mouth daily.    tiZANidine  (ZANAFLEX) 2 MG tablet TAKE ONE TABLET BY MOUTH EVERY 6 HOURS AS NEEDED FOR MUSCLE SPASM    torsemide (DEMADEX) 20 MG tablet Take 20 mg by mouth daily. 08/25/2021: PRN   traZODone (DESYREL) 50 MG tablet TAKE 1/2 TO 1 TABLET BY MOUTH EVERY EVENING 1 HOUR BEFORE BEDTIME    TRELEGY ELLIPTA 100-62.5-25 MCG/INH AEPB INHALE 1 PUFF BY MOUTH INTO THE LUNGS EVERY DAY    warfarin (COUMADIN) 2 MG tablet Take 4 mg by mouth at bedtime. Every day except on Thursday take 8mg .    No facility-administered encounter medications on file as of 01/04/2022.    Patient Active Problem List   Diagnosis Date Noted   Low back pain without sciatica 08/25/2021   Osteoporosis with current pathological fracture 08/25/2021   Muscle strain of left upper back 07/22/2021   Sequela, post-stroke 07/22/2021   S/P split thickness skin graft 07/11/2021   Hematoma 16/08/9603   Embolic stroke ([place]) 54/07/8118   Brachial artery occlusion, left ([place]) 06/02/2021   History of embolectomy 06/02/2021   History of fasciotomy 06/02/2021   Left arm pain 06/02/2021   Malignant neoplasm of colon, unspecified part of colon ([place]) 05/06/2021   Neuropathy 05/06/2021   PAF (paroxysmal atrial fibrillation) ([place]) 02/10/2021   Palpitation 02/10/2021   Red blood cell antibody positive 12/27/2020   Sternal wound infection 12/23/2020   Anticoagulation goal of INR 2 to 3 12/23/2020   LVAD (left ventricular assist device) present ([place]) 14/78/2956   Complication involving left ventricular assist device (LVAD) 12/23/2020   Hypothyroidism 12/23/2020   History of gastrointestinal bleeding 10/18/2020   Left ventricular assist device (LVAD) complication, initial encounter 09/23/2020   Infection following procedure 09/03/2020   Chronic anticoagulation 08/03/2020   S/P MVR (mitral valve replacement) 07/16/2020   PHT (pulmonary hypertension) ([place]) 07/07/2020   Snoring 07/05/2020   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 ([place])  07/05/2020   Ischemic cardiomyopathy 01/02/2020   CKD (chronic kidney disease), stage IIIa 12/31/2019   Pulmonary edema cardiac cause ([place]) 12/25/2019   Hyperlipidemia    Gouty arthritis of both feet 11/20/2019   Mitral stenosis with insufficiency    Hypertensive urgency    Depression    Hyperglycemia 09/22/2019   Acute respiratory failure with hypoxia ([place]) 09/22/2019   Presence of permanent cardiac pacemaker    Acute blood loss anemia    Chronic kidney disease    Hypertension    CHF (congestive heart failure) ([place]) 07/16/2019   Insomnia 05/07/2019   Suspected COVID-19 virus infection 03/23/2019   AVM (arteriovenous malformation) of small bowel, acquired    Anemia, iron deficiency 11/10/2018   Rectal polyp    Benign neoplasm of cecum    Barrett's esophagus without dysplasia    Chronic diastolic heart failure ([place]) 07/03/2018   HTN (hypertension) 07/03/2018   COPD with emphysema ([place]) 06/28/2018   B12 deficiency anemia 06/28/2018   Personal  history of colon cancer    Polyp of sigmoid colon    Diverticulosis of large intestine without diverticulitis    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   Renal artery stenosis ([place]) 10/03/2016   Encounter for assessment of implantable cardioverter-defibrillator (ICD) 05/06/2015   Failure of implantable cardioverter-defibrillator (ICD) lead 02/09/2015   Hospital discharge follow-up 02/09/2015   Cardiac resynchronization therapy defibrillator (CRT-D) in place 01/09/2015   Peripheral artery disease ([place]) 01/05/2015   Tobacco abuse 10/11/2014   S/P CABG x 3 07/03/2014   MI (myocardial infarction) ([place]) 07/03/2014   Atherosclerosis of native artery of extremity with intermittent claudication ([place]) 06/18/2013   Preoperative evaluation to rule out surgical contraindication 06/18/2013   CAD (coronary artery disease) 06/01/2013   GERD (gastroesophageal reflux disease) 06/01/2013   Hypercholesterolemia 06/01/2013   Tubular adenoma of colon  06/01/2013   Major depressive disorder in remission ([place]) 06/01/2013    Conditions to be addressed/monitored:CHF, HTN, COPD, and Anemia  Care Plan : RNCM   General Plan of Care (Adult)  Updates made by Leona Singleton, RN since 01/04/2022 12:00 AM     Problem: Knowledge defucuet related to self care managment of chronic  medical conditions   Priority: High     Long-Range Goal: Patient will work with CCM staff to learn self care skills to manage x   Start Date: 09/19/2021  Expected End Date: 09/19/2022  Recent Progress: Not on track  Priority: High  Note:   Current Barriers:  Knowledge Deficits related to plan of care for management of CHF, COPD, and Anemia/GI Bleed, and Spinal Fractures   Care Coordination needs related to Limited social support and Level of care concerns  Patient with recent discharge on 11/10/21 from [facility] related to anemia/GI bleed.  Received several blood transfusions; unsure of definite source of anemia/? GI bleed.  Was recovering from recent hospitalization for arm thrombectomy/fasciotomy.  Patient reporting wounds/incisions are healed EXCEPT large scab home health is adressing.  Diagnosed with spinal fracture post last hospitalization, repots back pains are much better.  Since discharge patient stating she is feeling weak and tired, a little short of  breath.  Weight was 143.9 pounds.  Have not been able to check BP yet.  States she has had to use her rescue nebulizer about 1 time in the past  3 days.  Encouraged patient to contact provider is still with increased need of nebulizer.  Denies any swelling.  Home Health ordered at discharge but has not made contact with patient as of yet.  No LVAD alarms.  States she will be checkining her INR and sending to Cardiology.  Has post hosp appointment with Cardiology on 1/25 (encouraged to keep and attend)  1/11-States she Is doing well.  Denies short of breath or dizziness, have not needed use rescue inhaler.  Back pain is  managable.  Weight 138 this morning.  Was unable to check iNR using home machine (agency checking); home healh nurse able to check  1/23--Patient stating she is doing well, feels good.  States she was able to sit up most of day yesterday and have family christmas celebration.  Weight this morning was 140.  Denies any chest pain, shortness of breath, or lower extremity edema.  Denies need to use nebulizer or rescue inhaler.  States LVAD did alarm few times but cleared before she could assess machine.  2/10--Hospital admission at [facility] 1/27-2/9 with low flow LVAD alarms, anemia & cardiac arrest times 2.  Home since yesterday.  States she  is very sore, did not get sleep last night.  Reports she has 7 broken ribs.  Short of breath laying and then start to cough. Did not weigh this morning.  Have not heard from home health.  Needs pain medicine refilled, encouraged to call PCP.  2/17--has attended post hospital appointment and received pain medication.  States pain is much better controlled.  Afraid to take pain medicine throughout the day due to sleepiness.  Discussed importance of pain management with coupghing and deep breathing,  Denies any increase in SOB.  Denies any LVAD alarms.  Diarrea has gotten better.  Denies needing rescue inhaler.  Has obtained new INR machine  2/24--Continues to be in large amounts of pain from broken ribs.  Uses pain meds mostly at night as she does not like to sleep a lot during the day.  Discused taking lower dose pain med, taking tylenol throughout the day to help managing pain, and the importance of pain management in prevention of pnemonia.  Denies increase in SOB and states she has not needed rescue inha;er/neb in the past few weeks.  Weight stable at 140 pounds.  Denies blood in stool.  Does report LVAD alarming once, states she drank water and alarm (low flow) stopped; instructed patient to call cardiology concerning alarm.  3/1--Patients voice sound stronger, staes she  feels better; is now taking pain meds throughout the  day for better pain control.  Weight stabe at 140 pounds.  Denies any swelling or increased SOB, not needing to use rescue inhaler in the last week.  1 low flow LVAD alarm, resoled after drinking some water.  States she has spoken with Cardiology and was told to continue to monitor.  Was able totke and send iNR to providers.  Denies any S/S of GI bleeding  RNCM Clinical Goal(s):  Patient will verbalize understanding of plan for management of CHF, COPD, and Anemia and Spinal fractures as evidenced by daily weight monitoring, pain management, COPD/Heart failure action plan, monitoring INR levels as ordered verbalize basic understanding of CHF, COPD, and Anemia and Spinal Fractures disease process and self health management plan as evidenced by working with therapy, increasing activity as tolerated, following action plans demonstrate improved health management independence as evidenced by close communication with providers, making sure INR meter stays in working order; knowing when to call provider based on weight and shortness of breath, LVAD monitoring/alert monitoring, monitoring self for signs and symptoms of anemia        not experience hospital admission as evidenced by review of EMR. Hospital Admissions in last 6 months = 4  through collaboration with RN Care manager, provider, and care team.   Interventions: 1:1 collaboration with primary care provider regarding development and update of comprehensive plan of care as evidenced by provider attestation and co-signature Inter-disciplinary care team collaboration (see longitudinal plan of care) Evaluation of current treatment plan related to  self management and patient's adherence to plan as established by provider Verified patient has number to home health agency; [facility] contacted home health [facility]  [phone]    Anemia/Bleeding:  (Status: Goal on track: NO.) Short Term Goal  Assessment of  understanding of anemia/bleeding disorder diagnosis Basic overview and discussion of anemia/bleeding disorder or acute disease state Medications reviewed - Counseled on bleeding risk associated with coumadin therapy and importance of self-monitoring for signs/symptoms of bleeding; - Counseled on avoidance of NSAIDs due to increased bleeding risk; - Counseled on importance of regular laboratory monitoring as directed by provider; -  Provided education about signs and symptoms of active bleeding such as stomach discomfort, coughing up blood or blood tinged secretions, bleeding from the gums/teeth, nosebleeds, increased bruising, blood in the urine/stool and/or if a traumatic injury occurs, regardless of severity of injury;  - Advised to call provider or 911 if active bleeding or signs and symptoms of active bleeding occur; - recommended promotion of rest and energy-conserving measures to manage fatigue, such as balancing activity with periods of rest - encouraged strategies to prevent falls related to fatigue, weakness and dizziness; encouraged sitting before standing and using an assistive device - encouraged optimal oral intake to support fluid balance and nutrition Continue to monitor yourself for low hemoglobin (feeling tired, exhausted, pale, shortness of breath, lack tarry stools)--SEEK MEDICAL ATTENTION RIGHT AWAY Verified has new working INR machine  Heart Failure Interventions:  (Status: Goal on Track (progressing): YES.)  Long Term Goal  Basic overview and discussion of pathophysiology of Heart Failure reviewed Provided education on low sodium diet Reviewed Heart Failure Action Plan in depth and provided written copy Advised patient to weigh each morning after emptying bladder Discussed importance of daily weight and advised patient to weigh and record daily Discussed the importance of keeping all appointments with provider Discussed daily check list and encouraged patient to complete;  reinforced use of checklist Verified patient recieved [facility] Calendar booklet to help document vitals  COPD: (Status: Goal on Track (progressing): YES.) Long Term Goal  Reviewed medications with patient, including use of prescribed maintenance and rescue inhalers, and provided instruction on medication management and the importance of adherence Provided patient with basic written and verbal COPD education on self care/management/and exacerbation prevention Advised patient to track and manage COPD triggers Provided instruction about proper use of medications used for management of COPD including inhalers Advised patient to self assesses COPD action plan zone and make appointment with provider if in the yellow zone for 48 hours without improvement Discussed the importance of adequate rest and management of fatigue with COPD Encouraged to do coughing and deep breathing exercises  Pain:  (Status: Goal on track: NO.) Short Term Goal  Pain assessment performed Medications reviewed Reviewed provider established plan for pain management; Discussed importance of adherence to all scheduled medical appointments; Counseled on the importance of reporting any/all new or changed pain symptoms or management strategies to pain management provider; Advised patient to report to care team affect of pain on daily activities; Discussed use of relaxation techniques and/or diversional activities to assist with pain reduction (distraction, imagery, relaxation, massage, acupressure, TENS, heat, and cold application; Reviewed with patient prescribed pharmacological and nonpharmacological pain relief strategies; Discussed the importance of pain control in relation to healing  Patient Goals/Self-Care Activities: Patient will self administer medications as prescribed as evidenced by self report/primary caregiver report  Patient will attend all scheduled provider appointments as evidenced by clinician review of documented  attendance to scheduled appointments and patient/caregiver report Patient will call provider office for new concerns or questions as evidenced by review of documented incoming telephone call notes and patient report call office if I gain more than 2 pounds in one day or 5 pounds in one week keep legs up while sitting track weight in diary use salt in moderation weigh myself daily follow rescue plan if symptoms flare-up track symptoms and what helps feel better or worse - identify and remove indoor air pollutants - limit outdoor activity during cold weather - eliminate symptom triggers at home - follow rescue plan if symptoms flare-up  Contact provider for increase use of rescue inhaler/nebulizer (use rescue inhaler and nebulizer for increase in shortness of breath) Call LVAD team for increase in shortness of breath on day 1 or 2 instead of waiting days later when it is worse Patient is to contact PCP for post hosp appointment as soon as possible  Call LVAD team for any LVAD alarms/recent alarm ASAP     Plan:The care management team will reach out to the patient again over the next 15 days.  Hubert Azure RN, MSN RN Care Management Coordinator [place] [phone] [email or web address]

## 2022-01-04 NOTE — Patient Instructions (Addendum)
Visit Information ? ?Thank you for taking time to visit with me today. Please don't hesitate to contact me if I can be of assistance to you before our next scheduled telephone appointment. ? ?Following are the goals we discussed today:  ?Patient will self administer medications as prescribed as evidenced by self report/primary caregiver report  ?Patient will attend all scheduled provider appointments as evidenced by clinician review of documented attendance to scheduled appointments and patient/caregiver report ?Patient will call provider office for new concerns or questions as evidenced by review of documented incoming telephone call notes and patient report ?call office if I gain more than 2 pounds in one day or 5 pounds in one week ?keep legs up while sitting ?track weight in diary ?use salt in moderation ?weigh myself daily ?follow rescue plan if symptoms flare-up ?track symptoms and what helps feel better or worse ?- identify and remove indoor air pollutants ?- limit outdoor activity during cold weather ?- eliminate symptom triggers at home ?- follow rescue plan if symptoms flare-up ?Contact provider for increase use of rescue inhaler/nebulizer (use rescue inhaler and nebulizer for increase in shortness of breath) ?Call LVAD team for increase in shortness of breath on day 1 or 2 instead of waiting days later when it is worse ?Patient is to contact PCP for post hosp appointment as soon as possible  ?Call LVAD team for any LVAD alarms/recent alarm ASAP ? ?Our next appointment is by telephone on 3/13 at 1315 ? ?Please call the care guide team at [phone] if you need to cancel or reschedule your appointment.  ? ?If you are experiencing a Mental Health or [place] or need someone to talk to, please call the Suicide and Crisis Lifeline: 988 ?call the [place] National Suicide Prevention Lifeline: 807-783-2573 or TTY: 414-786-8355 TTY 715-010-8912) to talk to a trained counselor ?call 1-800-273-TALK  (toll free, 24 hour hotline) ?call 911  ? ?Patient verbalizes understanding of instructions and care plan provided today and agrees to view in [place]. Active MyChart status confirmed with patient.   ? ?Hubert Azure RN, MSN ?RN Care Management Coordinator ?[place] ?[phone] ?[email or web address] ? ?

## 2022-01-05 DIAGNOSIS — S51802D Unspecified open wound of left forearm, subsequent encounter: Secondary | ICD-10-CM | POA: Diagnosis not present

## 2022-01-05 DIAGNOSIS — N183 Chronic kidney disease, stage 3 unspecified: Secondary | ICD-10-CM | POA: Diagnosis not present

## 2022-01-05 DIAGNOSIS — I5022 Chronic systolic (congestive) heart failure: Secondary | ICD-10-CM | POA: Diagnosis not present

## 2022-01-05 DIAGNOSIS — D649 Anemia, unspecified: Secondary | ICD-10-CM | POA: Diagnosis not present

## 2022-01-05 DIAGNOSIS — I739 Peripheral vascular disease, unspecified: Secondary | ICD-10-CM | POA: Diagnosis not present

## 2022-01-05 DIAGNOSIS — M109 Gout, unspecified: Secondary | ICD-10-CM | POA: Diagnosis not present

## 2022-01-05 DIAGNOSIS — E1122 Type 2 diabetes mellitus with diabetic chronic kidney disease: Secondary | ICD-10-CM | POA: Diagnosis not present

## 2022-01-05 DIAGNOSIS — J449 Chronic obstructive pulmonary disease, unspecified: Secondary | ICD-10-CM | POA: Diagnosis not present

## 2022-01-05 DIAGNOSIS — I10 Essential (primary) hypertension: Secondary | ICD-10-CM | POA: Diagnosis not present

## 2022-01-05 DIAGNOSIS — K922 Gastrointestinal hemorrhage, unspecified: Secondary | ICD-10-CM | POA: Diagnosis not present

## 2022-01-09 DIAGNOSIS — Z952 Presence of prosthetic heart valve: Secondary | ICD-10-CM | POA: Diagnosis not present

## 2022-01-09 DIAGNOSIS — Z7901 Long term (current) use of anticoagulants: Secondary | ICD-10-CM | POA: Diagnosis not present

## 2022-01-11 ENCOUNTER — Ambulatory Visit: Payer: Self-pay | Admitting: Pharmacist

## 2022-01-11 DIAGNOSIS — J449 Chronic obstructive pulmonary disease, unspecified: Secondary | ICD-10-CM | POA: Diagnosis not present

## 2022-01-11 DIAGNOSIS — E1122 Type 2 diabetes mellitus with diabetic chronic kidney disease: Secondary | ICD-10-CM | POA: Diagnosis not present

## 2022-01-11 DIAGNOSIS — I739 Peripheral vascular disease, unspecified: Secondary | ICD-10-CM | POA: Diagnosis not present

## 2022-01-11 DIAGNOSIS — K922 Gastrointestinal hemorrhage, unspecified: Secondary | ICD-10-CM | POA: Diagnosis not present

## 2022-01-11 DIAGNOSIS — N183 Chronic kidney disease, stage 3 unspecified: Secondary | ICD-10-CM | POA: Diagnosis not present

## 2022-01-11 DIAGNOSIS — I5022 Chronic systolic (congestive) heart failure: Secondary | ICD-10-CM | POA: Diagnosis not present

## 2022-01-11 DIAGNOSIS — S51802D Unspecified open wound of left forearm, subsequent encounter: Secondary | ICD-10-CM | POA: Diagnosis not present

## 2022-01-11 DIAGNOSIS — D649 Anemia, unspecified: Secondary | ICD-10-CM | POA: Diagnosis not present

## 2022-01-11 DIAGNOSIS — M109 Gout, unspecified: Secondary | ICD-10-CM | POA: Diagnosis not present

## 2022-01-11 DIAGNOSIS — I10 Essential (primary) hypertension: Secondary | ICD-10-CM | POA: Diagnosis not present

## 2022-01-11 NOTE — Chronic Care Management (AMB) (Signed)
?  Chronic Care Management  ? ?Note ? ?01/11/2022 ?Name: JALIN ERPELDING MRN: [id number] DOB: [birth year] ? ? ? ?Closing pharmacy CCM case at this time. Follow up with RN CM. Patient has clinic contact information for future questions or concerns.  ? ?[name] Darnelle Maffucci, PharmD, [place], CPP ?Clinical Pharmacist ?Therapist, music at [employer] ?[phone] ? ?

## 2022-01-12 DIAGNOSIS — I5022 Chronic systolic (congestive) heart failure: Secondary | ICD-10-CM | POA: Diagnosis not present

## 2022-01-12 DIAGNOSIS — I10 Essential (primary) hypertension: Secondary | ICD-10-CM | POA: Diagnosis not present

## 2022-01-12 DIAGNOSIS — N183 Chronic kidney disease, stage 3 unspecified: Secondary | ICD-10-CM | POA: Diagnosis not present

## 2022-01-12 DIAGNOSIS — M109 Gout, unspecified: Secondary | ICD-10-CM | POA: Diagnosis not present

## 2022-01-12 DIAGNOSIS — J449 Chronic obstructive pulmonary disease, unspecified: Secondary | ICD-10-CM | POA: Diagnosis not present

## 2022-01-12 DIAGNOSIS — E1122 Type 2 diabetes mellitus with diabetic chronic kidney disease: Secondary | ICD-10-CM | POA: Diagnosis not present

## 2022-01-12 DIAGNOSIS — K922 Gastrointestinal hemorrhage, unspecified: Secondary | ICD-10-CM | POA: Diagnosis not present

## 2022-01-12 DIAGNOSIS — I739 Peripheral vascular disease, unspecified: Secondary | ICD-10-CM | POA: Diagnosis not present

## 2022-01-12 DIAGNOSIS — S51802D Unspecified open wound of left forearm, subsequent encounter: Secondary | ICD-10-CM | POA: Diagnosis not present

## 2022-01-12 DIAGNOSIS — D649 Anemia, unspecified: Secondary | ICD-10-CM | POA: Diagnosis not present

## 2022-01-16 ENCOUNTER — Ambulatory Visit: Payer: Medicare Other | Admitting: *Deleted

## 2022-01-16 DIAGNOSIS — E1142 Type 2 diabetes mellitus with diabetic polyneuropathy: Secondary | ICD-10-CM

## 2022-01-16 DIAGNOSIS — D62 Acute posthemorrhagic anemia: Secondary | ICD-10-CM

## 2022-01-16 DIAGNOSIS — I5032 Chronic diastolic (congestive) heart failure: Secondary | ICD-10-CM

## 2022-01-16 NOTE — Patient Instructions (Addendum)
Visit Information ? ?Thank you for taking time to visit with me today. Please don't hesitate to contact me if I can be of assistance to you before our next scheduled telephone appointment. ? ?Following are the goals we discussed today:  ?Patient will self administer medications as prescribed as evidenced by self report/primary caregiver report  ?Patient will attend all scheduled provider appointments as evidenced by clinician review of documented attendance to scheduled appointments and patient/caregiver report ?Patient will call provider office for new concerns or questions as evidenced by review of documented incoming telephone call notes and patient report ?call office if I gain more than 2 pounds in one day or 5 pounds in one week ?keep legs up while sitting ?track weight in diary ?use salt in moderation ?weigh myself daily ?follow rescue plan if symptoms flare-up ?track symptoms and what helps feel better or worse ?- identify and remove indoor air pollutants ?- limit outdoor activity during cold weather ?- eliminate symptom triggers at home ?- follow rescue plan if symptoms flare-up ?Contact provider for increase use of rescue inhaler/nebulizer (use rescue inhaler and nebulizer for increase in shortness of breath) ?Call LVAD team for increase in shortness of breath on day 1 or 2 instead of waiting days later when it is worse ?Patient is to contact PCP for post hosp appointment as soon as possible  ?Call LVAD team for any LVAD alarms/recent alarm ASAP ? ?Our next appointment is by telephone on 3/27 at 1345 ? ?Please call the care guide team at [phone] if you need to cancel or reschedule your appointment.  ? ?If you are experiencing a Mental Health or [place] or need someone to talk to, please call the Suicide and Crisis Lifeline: 988 ?call the [place] National Suicide Prevention Lifeline: 519-411-8492 or TTY: 825-095-5172 TTY 620-421-8745) to talk to a trained counselor ?call 1-800-273-TALK  (toll free, 24 hour hotline) ?call 911  ? ?Patient verbalizes understanding of instructions and care plan provided today and agrees to view in [place]. Active MyChart status confirmed with patient.   ? ?Hubert Azure RN, MSN ?RN Care Management Coordinator ?[place] ?[phone] ?[email or web address] ? ?

## 2022-01-16 NOTE — Chronic Care Management (AMB) (Cosign Needed)
Chronic Care Management   CCM RN Visit Note  01/16/2022 Name: Ashley [name] MRN: [id number] DOB: February 08, 1951  Subjective: Ashley [name] is a 71 y.o. year old female who is a primary care patient of [name], MD. The care management team was consulted for assistance with disease management and care coordination needs.    Engaged with patient by telephone for follow up visit in response to provider referral for case management and/or care coordination services.   Consent to Services:  The patient was given information about Chronic Care Management services, agreed to services, and gave verbal consent prior to initiation of services.  Please see initial visit note for detailed documentation.   Patient agreed to services and verbal consent obtained.   Assessment: Review of patient past medical history, allergies, medications, health status, including review of consultants reports, laboratory and other test data, was performed as part of comprehensive evaluation and provision of chronic care management services.   SDOH (Social Determinants of Health) assessments and interventions performed:    CCM Care Plan  Allergies  Allergen Reactions   Oxycodone     Other reaction(s): Hallucination Hallucinations to oxycodone noted while inpatient 06/2021. Tolerates hydromorphone.    Vancomycin Nausea And Vomiting and Palpitations   Hydrocodone-Acetaminophen Nausea Only    Outpatient Encounter Medications as of 01/16/2022  Medication Sig Note   acetaminophen (TYLENOL) 325 MG tablet Take 650 mg by mouth every 6 (six) hours as needed.    albuterol (PROAIR HFA) 108 (90 Base) MCG/ACT inhaler Inhale 1-2 puffs into the lungs every 6 (six) hours as needed for wheezing or shortness of breath.    allopurinol (ZYLOPRIM) 100 MG tablet TAKE ONE TABLET BY MOUTH ONCE DAILY    amitriptyline (ELAVIL) 25 MG tablet Take 1 tablet (25 mg total) by mouth at bedtime.    amLODipine (NORVASC) 2.5 MG tablet  Take 2.5 mg by mouth daily.    atorvastatin (LIPITOR) 40 MG tablet TAKE ONE TABLET BY MOUTH ONCE DAILY    carvedilol (COREG) 12.5 MG tablet SMARTSIG:1 Tablet(s) By Mouth Every 12 Hours    carvedilol (COREG) 25 MG tablet Take 25 mg by mouth 2 (two) times daily. (Patient not taking: Reported on 12/21/2021)    colchicine 0.6 MG tablet  01/03/2021: Reports taking only when gout flares up   escitalopram (LEXAPRO) 10 MG tablet TAKE ONE TABLET BY MOUTH ONCE DAILY    ferrous sulfate 325 (65 FE) MG tablet Take 325 mg by mouth daily with breakfast.    fluticasone (FLONASE) 50 MCG/ACT nasal spray Place 1 spray into both nostrils as needed for allergies or rhinitis.    folic acid (FOLVITE) 1 MG tablet Take 1 tablet (1 mg total) by mouth daily.    GNP MUCUS ER 600 MG 12 hr tablet Take 600 mg by mouth 2 (two) times daily. 01/03/2021: Reports taking as needed   HYDROmorphone (DILAUDID) 2 MG tablet Take 1 tablet (2 mg total) by mouth every 6 (six) hours as needed. FOR CHRONIC LOW BACK PAIN    ipratropium-albuterol (DUONEB) 0.5-2.5 (3) MG/3ML SOLN USE 1 VIAL VIA NEBULIZER AND INHALE BY MOUTH INTO THE LUNGS EVERY 6 HOURS AS NEEDED    levothyroxine (SYNTHROID) 75 MCG tablet Take 1 tablet (75 mcg total) by mouth daily. 12/16/2021: REPORTS DOSE IS 50 MCG DAILY   lisinopril (ZESTRIL) 5 MG tablet Take 5 mg by mouth daily.    magnesium oxide (MAG-OX) 400 (241.3 Mg) MG tablet Take 1 tablet by mouth every Monday,  Wednesday, and Friday.    nitroGLYCERIN (NITROSTAT) 0.4 MG SL tablet Place under the tongue.    pantoprazole (PROTONIX) 40 MG tablet Take 40 mg by mouth 2 (two) times daily.    potassium chloride (KLOR-CON) 10 MEQ tablet Take 10 mEq by mouth every Monday, Wednesday, and Friday. Take 2 tablets every Monday Wednesday Friday 08/25/2021: PRN with fluid pill   sacubitril-valsartan (ENTRESTO) 97-103 MG Take 1 tablet by mouth 2 (two) times daily.    spironolactone (ALDACTONE) 25 MG tablet Take 25 mg by mouth daily.     tiZANidine (ZANAFLEX) 2 MG tablet TAKE ONE TABLET BY MOUTH EVERY 6 HOURS AS NEEDED FOR MUSCLE SPASM    torsemide (DEMADEX) 20 MG tablet Take 20 mg by mouth daily. 08/25/2021: PRN   traZODone (DESYREL) 50 MG tablet TAKE 1/2 TO 1 TABLET BY MOUTH EVERY EVENING 1 HOUR BEFORE BEDTIME    TRELEGY ELLIPTA 100-62.5-25 MCG/INH AEPB INHALE 1 PUFF BY MOUTH INTO THE LUNGS EVERY DAY    warfarin (COUMADIN) 2 MG tablet Take 4 mg by mouth at bedtime. Every day except on Thursday take '8mg'$ .    No facility-administered encounter medications on file as of 01/16/2022.    Patient Active Problem List   Diagnosis Date Noted   Low back pain without sciatica 08/25/2021   Osteoporosis with current pathological fracture 08/25/2021   Muscle strain of left upper back 07/22/2021   [place]ela, post-stroke 07/22/2021   S/P split thickness skin graft 07/11/2021   Hematoma 50/53/9767   Embolic stroke ([place]) 34/19/3790   Brachial artery occlusion, left ([place]) 06/02/2021   History of embolectomy 06/02/2021   History of fasciotomy 06/02/2021   Left arm pain 06/02/2021   Malignant neoplasm of colon, unspecified part of colon ([place]) 05/06/2021   Neuropathy 05/06/2021   PAF (paroxysmal atrial fibrillation) ([place]) 02/10/2021   Palpitation 02/10/2021   Red blood cell antibody positive 12/27/2020   Sternal wound infection 12/23/2020   Anticoagulation goal of INR 2 to 3 12/23/2020   LVAD (left ventricular assist device) present ([place]) 24/07/7352   Complication involving left ventricular assist device (LVAD) 12/23/2020   Hypothyroidism 12/23/2020   History of gastrointestinal bleeding 10/18/2020   Left ventricular assist device (LVAD) complication, initial encounter 09/23/2020   Infection following procedure 09/03/2020   Chronic anticoagulation 08/03/2020   S/P MVR (mitral valve replacement) 07/16/2020   PHT (pulmonary hypertension) ([place]) 07/07/2020   Snoring 07/05/2020   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 ([place]) 07/05/2020   Ischemic cardiomyopathy 01/02/2020   CKD (chronic kidney disease), stage IIIa 12/31/2019   Pulmonary edema cardiac cause ([place]) 12/25/2019   Hyperlipidemia    Gouty arthritis of both feet 11/20/2019   Mitral stenosis with insufficiency    Hypertensive urgency    Depression    Hyperglycemia 09/22/2019   Acute respiratory failure with hypoxia ([place]) 09/22/2019   Presence of permanent cardiac pacemaker    Acute blood loss anemia    Chronic kidney disease    Hypertension    CHF (congestive heart failure) ([place]) 07/16/2019   Insomnia 05/07/2019   Suspected COVID-19 virus infection 03/23/2019   AVM (arteriovenous malformation) of small bowel, acquired    Anemia, iron deficiency 11/10/2018   Rectal polyp    Benign neoplasm of cecum    Barrett's esophagus without dysplasia    Chronic diastolic heart failure ([place]) 07/03/2018   HTN (hypertension) 07/03/2018   COPD with emphysema ([place]) 06/28/2018   B12 deficiency anemia 06/28/2018   Personal  history of colon cancer    Polyp of sigmoid colon    Diverticulosis of large intestine without diverticulitis    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   Renal artery stenosis ([place]) 10/03/2016   Encounter for assessment of implantable cardioverter-defibrillator (ICD) 05/06/2015   Failure of implantable cardioverter-defibrillator (ICD) lead 02/09/2015   Hospital discharge follow-up 02/09/2015   Cardiac resynchronization therapy defibrillator (CRT-D) in place 01/09/2015   Peripheral artery disease ([place]) 01/05/2015   Tobacco abuse 10/11/2014   S/P CABG x 3 07/03/2014   MI (myocardial infarction) ([place]) 07/03/2014   Atherosclerosis of native artery of extremity with intermittent claudication ([place]) 06/18/2013   Preoperative evaluation to rule out surgical contraindication 06/18/2013   CAD (coronary artery disease) 06/01/2013   GERD (gastroesophageal reflux disease) 06/01/2013   Hypercholesterolemia 06/01/2013   Tubular adenoma of  colon 06/01/2013   Major depressive disorder in remission ([place]) 06/01/2013    Conditions to be addressed/monitored:CHF and COPD  Care Plan : [place] (Adult)  Updates made by Leona Singleton, RN since 01/16/2022 12:00 AM     Problem: Knowledge defucuet related to self care managment of chronic  medical conditions   Priority: High     Long-Range Goal: Patient will work with CCM staff to learn self care skills to manage x   Start Date: 09/19/2021  Expected End Date: 09/19/2022  Recent Progress: Not on track  Priority: High  Note:   Current Barriers:  Knowledge Deficits related to plan of care for management of CHF, COPD, and Anemia/GI Bleed, and Spinal Fractures   Care Coordination needs related to Limited social support and Level of care concerns   2/10--Hospital admission at [facility] 1/27-2/9 with low flow LVAD alarms, anemia & cardiac arrest times 2.  Home since yesterday.  States she is very sore, did not get sleep last night.  Reports she has 7 broken ribs.  Short of breath laying and then start to cough. Did not weigh this morning.  Have not heard from home health.  Needs pain medicine refilled, encouraged to call PCP.  2/17--has attended post hospital appointment and received pain medication.  States pain is much better controlled.  Afraid to take pain medicine throughout the day due to sleepiness.  Discussed importance of pain management with coupghing and deep breathing,  Denies any increase in SOB.  Denies any LVAD alarms.  Diarrea has gotten better.  Denies needing rescue inhaler.  Has obtained new INR machine  2/24--Continues to be in large amounts of pain from broken ribs.  Uses pain meds mostly at night as she does not like to sleep a lot during the day.  Discused taking lower dose pain med, taking tylenol throughout the day to help managing pain, and the importance of pain management in prevention of pnemonia.  Denies increase in SOB and states she has not  needed rescue inha;er/neb in the past few weeks.  Weight stable at 140 pounds.  Denies blood in stool.  Does report LVAD alarming once, states she drank water and alarm (low flow) stopped; instructed patient to call cardiology concerning alarm.  3/1--Patients voice sound stronger, staes she feels better; is now taking pain meds throughout the  day for better pain control.  Weight stabe at 140 pounds.  Denies any swelling or increased SOB, not needing to use rescue inhaler in the last week.  1 low flow LVAD alarm, resoled after drinking some water.  States she has spoken with Cardiology and was told to  continue to monitor.  Was able totke and send iNR to providers.  Denies any S/S of GI bleeding  3/13--patient stating she feels better.  Rib pain is much better controlled.  Denies swelling, sob, or need for rescue inhaler,  denies any recent lvad alarms.  Had to cancel cardiology appointment today due to transportation.  Provided patient number to care guides for transportation options; denies any issues or concerns  RNCM Clinical Goal(s):  Patient will verbalize understanding of plan for management of CHF, COPD, and Anemia and Spinal fractures as evidenced by daily weight monitoring, pain management, COPD/Heart failure action plan, monitoring INR levels as ordered verbalize basic understanding of CHF, COPD, and Anemia and Spinal Fractures disease process and self health management plan as evidenced by working with therapy, increasing activity as tolerated, following action plans demonstrate improved health management independence as evidenced by close communication with providers, making sure INR meter stays in working order; knowing when to call provider based on weight and shortness of breath, LVAD monitoring/alert monitoring, monitoring self for signs and symptoms of anemia        not experience hospital admission as evidenced by review of EMR. Hospital Admissions in last 6 months = 4  through  collaboration with RN Care manager, provider, and care team.   Interventions: 1:1 collaboration with primary care provider regarding development and update of comprehensive plan of care as evidenced by provider attestation and co-signature Inter-disciplinary care team collaboration (see longitudinal plan of care) Evaluation of current treatment plan related to  self management and patient's adherence to plan as established by provider Verified patient has number to home health agency; [facility] contacted home health [facility]  [phone]    Anemia/Bleeding:  (Status: Goal on track: NO.) Short Term Goal  Assessment of understanding of anemia/bleeding disorder diagnosis Basic overview and discussion of anemia/bleeding disorder or acute disease state Medications reviewed - Counseled on bleeding risk associated with coumadin therapy and importance of self-monitoring for signs/symptoms of bleeding; - Counseled on avoidance of NSAIDs due to increased bleeding risk; - Counseled on importance of regular laboratory monitoring as directed by provider; - Provided education about signs and symptoms of active bleeding such as stomach discomfort, coughing up blood or blood tinged secretions, bleeding from the gums/teeth, nosebleeds, increased bruising, blood in the urine/stool and/or if a traumatic injury occurs, regardless of severity of injury;  - Advised to call provider or 911 if active bleeding or signs and symptoms of active bleeding occur; - recommended promotion of rest and energy-conserving measures to manage fatigue, such as balancing activity with periods of rest - encouraged strategies to prevent falls related to fatigue, weakness and dizziness; encouraged sitting before standing and using an assistive device - encouraged optimal oral intake to support fluid balance and nutrition Continue to monitor yourself for low hemoglobin (feeling tired, exhausted, pale, shortness of breath, lack tarry  stools)--SEEK MEDICAL ATTENTION RIGHT AWAY Verified has new working INR machine  Heart Failure Interventions:  (Status: Goal on Track (progressing): YES.)  Long Term Goal  Basic overview and discussion of pathophysiology of Heart Failure reviewed Provided education on low sodium diet Reviewed Heart Failure Action Plan in depth and provided written copy Advised patient to weigh each morning after emptying bladder Discussed importance of daily weight and advised patient to weigh and record daily Discussed the importance of keeping all appointments with provider Discussed daily check list and encouraged patient to complete; reinforced use of checklist  COPD: (Status: Goal on Track (progressing): YES.) Long  Term Goal  Reviewed medications with patient, including use of prescribed maintenance and rescue inhalers, and provided instruction on medication management and the importance of adherence Provided patient with basic written and verbal COPD education on self care/management/and exacerbation prevention Advised patient to track and manage COPD triggers Provided instruction about proper use of medications used for management of COPD including inhalers Advised patient to self assesses COPD action plan zone and make appointment with provider if in the yellow zone for 48 hours without improvement Discussed the importance of adequate rest and management of fatigue with COPD Encouraged to do coughing and deep breathing exercises  Pain:  (Status: Goal on track: NO.) Short Term Goal  Pain assessment performed Medications reviewed Reviewed provider established plan for pain management; Discussed importance of adherence to all scheduled medical appointments; Counseled on the importance of reporting any/all new or changed pain symptoms or management strategies to pain management provider; Advised patient to report to care team affect of pain on daily activities; Discussed use of relaxation techniques  and/or diversional activities to assist with pain reduction (distraction, imagery, relaxation, massage, acupressure, TENS, heat, and cold application; Reviewed with patient prescribed pharmacological and nonpharmacological pain relief strategies; Discussed the importance of pain control in relation to healing  Patient Goals/Self-Care Activities: Patient will self administer medications as prescribed as evidenced by self report/primary caregiver report  Patient will attend all scheduled provider appointments as evidenced by clinician review of documented attendance to scheduled appointments and patient/caregiver report Patient will call provider office for new concerns or questions as evidenced by review of documented incoming telephone call notes and patient report call office if I gain more than 2 pounds in one day or 5 pounds in one week keep legs up while sitting track weight in diary use salt in moderation weigh myself daily follow rescue plan if symptoms flare-up track symptoms and what helps feel better or worse - identify and remove indoor air pollutants - limit outdoor activity during cold weather - eliminate symptom triggers at home - follow rescue plan if symptoms flare-up Contact provider for increase use of rescue inhaler/nebulizer (use rescue inhaler and nebulizer for increase in shortness of breath) Call LVAD team for increase in shortness of breath on day 1 or 2 instead of waiting days later when it is worse Patient is to contact PCP for post hosp appointment as soon as possible  Call LVAD team for any LVAD alarms/recent alarm ASAP     Plan:The care management team will reach out to the patient again over the next 20 days.  Hubert Azure RN, MSN RN Care Management Coordinator [place] [phone] [email or web address]

## 2022-01-25 DIAGNOSIS — Z79899 Other long term (current) drug therapy: Secondary | ICD-10-CM | POA: Diagnosis not present

## 2022-01-25 DIAGNOSIS — I5022 Chronic systolic (congestive) heart failure: Secondary | ICD-10-CM | POA: Diagnosis not present

## 2022-01-25 DIAGNOSIS — R9431 Abnormal electrocardiogram [ECG] [EKG]: Secondary | ICD-10-CM | POA: Diagnosis not present

## 2022-01-25 DIAGNOSIS — I214 Non-ST elevation (NSTEMI) myocardial infarction: Secondary | ICD-10-CM | POA: Diagnosis not present

## 2022-01-25 DIAGNOSIS — I10 Essential (primary) hypertension: Secondary | ICD-10-CM | POA: Diagnosis not present

## 2022-01-25 DIAGNOSIS — I2510 Atherosclerotic heart disease of native coronary artery without angina pectoris: Secondary | ICD-10-CM | POA: Diagnosis not present

## 2022-01-25 DIAGNOSIS — I480 Paroxysmal atrial fibrillation: Secondary | ICD-10-CM | POA: Diagnosis not present

## 2022-01-25 DIAGNOSIS — Z5181 Encounter for therapeutic drug level monitoring: Secondary | ICD-10-CM | POA: Diagnosis not present

## 2022-01-25 DIAGNOSIS — K219 Gastro-esophageal reflux disease without esophagitis: Secondary | ICD-10-CM | POA: Diagnosis not present

## 2022-01-25 DIAGNOSIS — Z95811 Presence of heart assist device: Secondary | ICD-10-CM | POA: Diagnosis not present

## 2022-01-25 DIAGNOSIS — E785 Hyperlipidemia, unspecified: Secondary | ICD-10-CM | POA: Diagnosis not present

## 2022-01-25 DIAGNOSIS — T829XXD Unspecified complication of cardiac and vascular prosthetic device, implant and graft, subsequent encounter: Secondary | ICD-10-CM | POA: Diagnosis not present

## 2022-01-25 DIAGNOSIS — I110 Hypertensive heart disease with heart failure: Secondary | ICD-10-CM | POA: Diagnosis not present

## 2022-01-25 DIAGNOSIS — I25708 Atherosclerosis of coronary artery bypass graft(s), unspecified, with other forms of angina pectoris: Secondary | ICD-10-CM | POA: Diagnosis not present

## 2022-01-25 DIAGNOSIS — Z7901 Long term (current) use of anticoagulants: Secondary | ICD-10-CM | POA: Diagnosis not present

## 2022-01-25 DIAGNOSIS — I255 Ischemic cardiomyopathy: Secondary | ICD-10-CM | POA: Diagnosis not present

## 2022-01-25 DIAGNOSIS — Z952 Presence of prosthetic heart valve: Secondary | ICD-10-CM | POA: Diagnosis not present

## 2022-01-25 DIAGNOSIS — R002 Palpitations: Secondary | ICD-10-CM | POA: Diagnosis not present

## 2022-01-25 DIAGNOSIS — T829XXA Unspecified complication of cardiac and vascular prosthetic device, implant and graft, initial encounter: Secondary | ICD-10-CM | POA: Diagnosis not present

## 2022-01-30 ENCOUNTER — Ambulatory Visit: Payer: Medicare Other | Admitting: *Deleted

## 2022-01-30 DIAGNOSIS — D62 Acute posthemorrhagic anemia: Secondary | ICD-10-CM

## 2022-01-30 DIAGNOSIS — J441 Chronic obstructive pulmonary disease with (acute) exacerbation: Secondary | ICD-10-CM

## 2022-01-30 DIAGNOSIS — I5032 Chronic diastolic (congestive) heart failure: Secondary | ICD-10-CM

## 2022-01-30 NOTE — Patient Instructions (Addendum)
Visit Information ? ?Thank you for taking time to visit with me today. Please don't hesitate to contact me if I can be of assistance to you before our next scheduled telephone appointment. ? ?Following are the goals we discussed today:  ?Patient will self administer medications as prescribed as evidenced by self report/primary caregiver report  ?Patient will attend all scheduled provider appointments as evidenced by clinician review of documented attendance to scheduled appointments and patient/caregiver report ?Patient will call provider office for new concerns or questions as evidenced by review of documented incoming telephone call notes and patient report ?call office if I gain more than 2 pounds in one day or 5 pounds in one week ?keep legs up while sitting ?track weight in diary ?use salt in moderation ?weigh myself daily ?follow rescue plan if symptoms flare-up ?track symptoms and what helps feel better or worse ?- identify and remove indoor air pollutants ?- limit outdoor activity during cold weather ?- eliminate symptom triggers at home ?- follow rescue plan if symptoms flare-up ?Contact provider for increase use of rescue inhaler/nebulizer (use rescue inhaler and nebulizer for increase in shortness of breath) ?Call LVAD team for increase in shortness of breath on day 1 or 2 instead of waiting days later when it is worse ?Call LVAD team for any LVAD alarms/recent alarm ASAP ? ?Our next appointment is by telephone on 4/17 at 1315 ? ?Please call the care guide team at [phone] if you need to cancel or reschedule your appointment.  ? ?If you are experiencing a Mental Health or [place] or need someone to talk to, please call the Suicide and Crisis Lifeline: 988 ?call the [place] National Suicide Prevention Lifeline: 5702962810 or TTY: (475)389-0200 TTY 731-869-3157) to talk to a trained counselor ?call 1-800-273-TALK (toll free, 24 hour hotline) ?call 911  ? ?Patient verbalizes  understanding of instructions and care plan provided today and agrees to view in [place]. Active MyChart status confirmed with patient.   ? ?Hubert Azure RN, MSN ?RN Care Management Coordinator ?[place] ?[phone] ?[email or web address] ?  ?

## 2022-02-01 DIAGNOSIS — Z7901 Long term (current) use of anticoagulants: Secondary | ICD-10-CM | POA: Diagnosis not present

## 2022-02-01 DIAGNOSIS — I5022 Chronic systolic (congestive) heart failure: Secondary | ICD-10-CM | POA: Diagnosis not present

## 2022-02-03 DIAGNOSIS — E1142 Type 2 diabetes mellitus with diabetic polyneuropathy: Secondary | ICD-10-CM

## 2022-02-03 DIAGNOSIS — I5032 Chronic diastolic (congestive) heart failure: Secondary | ICD-10-CM

## 2022-02-03 DIAGNOSIS — J441 Chronic obstructive pulmonary disease with (acute) exacerbation: Secondary | ICD-10-CM

## 2022-02-03 DIAGNOSIS — D62 Acute posthemorrhagic anemia: Secondary | ICD-10-CM

## 2022-02-03 NOTE — Chronic Care Management (AMB) (Signed)
?Chronic Care Management  ? ?CCM RN Visit Note ? ?02/03/2022 ?Name: Ashley [name] MRN: [id number] DOB: 03-26-1951 ? ?Subjective: ?Ashley [name] is a 71 y.o. year old female who is a primary care patient of [name], [name], MD. The care management team was consulted for assistance with disease management and care coordination needs.   ? ?Engaged with patient by telephone for follow up visit in response to provider referral for case management and/or care coordination services.  ? ?Consent to Services:  ?The patient was given information about Chronic Care Management services, agreed to services, and gave verbal consent prior to initiation of services.  Please see initial visit note for detailed documentation.  ? ?Patient agreed to services and verbal consent obtained.  ? ?Assessment: Review of patient past medical history, allergies, medications, health status, including review of consultants reports, laboratory and other test data, was performed as part of comprehensive evaluation and provision of chronic care management services.  ? ?SDOH (Social Determinants of Health) assessments and interventions performed:   ? ?CCM Care Plan ? ?Allergies  ?Allergen Reactions  ? Oxycodone   ?  Other reaction(s): Hallucination ?Hallucinations to oxycodone noted while inpatient 06/2021. Tolerates hydromorphone.   ? Vancomycin Nausea And Vomiting and Palpitations  ? Hydrocodone-Acetaminophen Nausea Only  ? ? ?Outpatient Encounter Medications as of 01/30/2022  ?Medication Sig Note  ? acetaminophen (TYLENOL) 325 MG tablet Take 650 mg by mouth every 6 (six) hours as needed.   ? albuterol (PROAIR HFA) 108 (90 Base) MCG/ACT inhaler Inhale 1-2 puffs into the lungs every 6 (six) hours as needed for wheezing or shortness of breath.   ? allopurinol (ZYLOPRIM) 100 MG tablet TAKE ONE TABLET BY MOUTH ONCE DAILY   ? amitriptyline (ELAVIL) 25 MG tablet Take 1 tablet (25 mg total) by mouth at bedtime.   ? amLODipine (NORVASC) 2.5 MG tablet  Take 2.5 mg by mouth daily.   ? atorvastatin (LIPITOR) 40 MG tablet TAKE ONE TABLET BY MOUTH ONCE DAILY   ? carvedilol (COREG) 12.5 MG tablet SMARTSIG:1 Tablet(s) By Mouth Every 12 Hours   ? carvedilol (COREG) 25 MG tablet Take 25 mg by mouth 2 (two) times daily. (Patient not taking: Reported on 12/21/2021)   ? colchicine 0.6 MG tablet  01/03/2021: Reports taking only when gout flares up  ? escitalopram (LEXAPRO) 10 MG tablet TAKE ONE TABLET BY MOUTH ONCE DAILY   ? ferrous sulfate 325 (65 FE) MG tablet Take 325 mg by mouth daily with breakfast.   ? fluticasone (FLONASE) 50 MCG/ACT nasal spray Place 1 spray into both nostrils as needed for allergies or rhinitis.   ? folic acid (FOLVITE) 1 MG tablet Take 1 tablet (1 mg total) by mouth daily.   ? GNP MUCUS ER 600 MG 12 hr tablet Take 600 mg by mouth 2 (two) times daily. 01/03/2021: Reports taking as needed  ? HYDROmorphone (DILAUDID) 2 MG tablet Take 1 tablet (2 mg total) by mouth every 6 (six) hours as needed. FOR CHRONIC LOW BACK PAIN   ? ipratropium-albuterol (DUONEB) 0.5-2.5 (3) MG/3ML SOLN USE 1 VIAL VIA NEBULIZER AND INHALE BY MOUTH INTO THE LUNGS EVERY 6 HOURS AS NEEDED   ? levothyroxine (SYNTHROID) 75 MCG tablet Take 1 tablet (75 mcg total) by mouth daily. 12/16/2021: REPORTS DOSE IS 50 MCG DAILY  ? lisinopril (ZESTRIL) 5 MG tablet Take 5 mg by mouth daily.   ? magnesium oxide (MAG-OX) 400 (241.3 Mg) MG tablet Take 1 tablet by mouth every Monday,  Wednesday, and Friday.   ? nitroGLYCERIN (NITROSTAT) 0.4 MG SL tablet Place under the tongue.   ? pantoprazole (PROTONIX) 40 MG tablet Take 40 mg by mouth 2 (two) times daily.   ? potassium chloride (KLOR-CON) 10 MEQ tablet Take 10 mEq by mouth every Monday, Wednesday, and Friday. Take 2 tablets every Monday Wednesday Friday 08/25/2021: PRN with fluid pill  ? sacubitril-valsartan (ENTRESTO) 97-103 MG Take 1 tablet by mouth 2 (two) times daily.   ? spironolactone (ALDACTONE) 25 MG tablet Take 25 mg by mouth daily.   ?  tiZANidine (ZANAFLEX) 2 MG tablet TAKE ONE TABLET BY MOUTH EVERY 6 HOURS AS NEEDED FOR MUSCLE SPASM   ? torsemide (DEMADEX) 20 MG tablet Take 20 mg by mouth daily. 08/25/2021: PRN  ? traZODone (DESYREL) 50 MG tablet TAKE 1/2 TO 1 TABLET BY MOUTH EVERY EVENING 1 HOUR BEFORE BEDTIME   ? TRELEGY ELLIPTA 100-62.5-25 MCG/INH AEPB INHALE 1 PUFF BY MOUTH INTO THE LUNGS EVERY DAY   ? warfarin (COUMADIN) 2 MG tablet Take 4 mg by mouth at bedtime. Every day except on Thursday take '8mg'$ .   ? ?No facility-administered encounter medications on file as of 01/30/2022.  ? ? ?Patient Active Problem List  ? Diagnosis Date Noted  ? Low back pain without sciatica 08/25/2021  ? Osteoporosis with current pathological fracture 08/25/2021  ? Muscle strain of left upper back 07/22/2021  ? Sequela, post-stroke 07/22/2021  ? S/P split thickness skin graft 07/11/2021  ? Hematoma 06/11/2021  ? Embolic stroke ([place]) 77/82/4235  ? Brachial artery occlusion, left ([place]) 06/02/2021  ? History of embolectomy 06/02/2021  ? History of fasciotomy 06/02/2021  ? Left arm pain 06/02/2021  ? Malignant neoplasm of colon, unspecified part of colon ([place]) 05/06/2021  ? Neuropathy 05/06/2021  ? PAF (paroxysmal atrial fibrillation) ([place]) 02/10/2021  ? Palpitation 02/10/2021  ? Red blood cell antibody positive 12/27/2020  ? Sternal wound infection 12/23/2020  ? Anticoagulation goal of INR 2 to 3 12/23/2020  ? LVAD (left ventricular assist device) present ([place]) 12/23/2020  ? Complication involving left ventricular assist device (LVAD) 12/23/2020  ? Hypothyroidism 12/23/2020  ? History of gastrointestinal bleeding 10/18/2020  ? Left ventricular assist device (LVAD) complication, initial encounter 09/23/2020  ? Infection following procedure 09/03/2020  ? Chronic anticoagulation 08/03/2020  ? S/P MVR (mitral valve replacement) 07/16/2020  ? PHT (pulmonary hypertension) ([place]) 07/07/2020  ? Snoring 07/05/2020  ? 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 Ischemic cardiomyopathy 01/02/2020  ? CKD (chronic kidney disease), stage IIIa 12/31/2019  ? Pulmonary edema cardiac cause ([place]) 12/25/2019  ? Hyperlipidemia   ? Gouty arthritis of both feet 11/20/2019  ? Mitral stenosis with insufficiency   ? Hypertensive urgency   ? Depression   ? Hyperglycemia 09/22/2019  ? Acute respiratory failure with hypoxia ([place]) 09/22/2019  ? Presence of permanent cardiac pacemaker   ? Acute blood loss anemia   ? Chronic kidney disease   ? Hypertension   ? CHF (congestive heart failure) ([place]) 07/16/2019  ? Insomnia 05/07/2019  ? Suspected COVID-19 virus infection 03/23/2019  ? AVM (arteriovenous malformation) of small bowel, acquired   ? Anemia, iron deficiency 11/10/2018  ? Rectal polyp   ? Benign neoplasm of cecum   ? Barrett's esophagus without dysplasia   ? Chronic diastolic heart failure ([place]) 07/03/2018  ? HTN (hypertension) 07/03/2018  ? COPD with emphysema ([place]) 06/28/2018  ? B12 deficiency anemia 06/28/2018  ? Personal  history of colon cancer   ? Polyp of sigmoid colon   ? Diverticulosis of large intestine without diverticulitis   ? 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  ? Renal artery stenosis ([place]) 10/03/2016  ? Encounter for assessment of implantable cardioverter-defibrillator (ICD) 05/06/2015  ? Failure of implantable cardioverter-defibrillator (ICD) lead 02/09/2015  ? Hospital discharge follow-up 02/09/2015  ? Cardiac resynchronization therapy defibrillator (CRT-D) in place 01/09/2015  ? Peripheral artery disease ([place]) 01/05/2015  ? Tobacco abuse 10/11/2014  ? S/P CABG x 3 07/03/2014  ? MI (myocardial infarction) ([place]) 07/03/2014  ? Atherosclerosis of native artery of extremity with intermittent claudication ([place]) 06/18/2013  ? Preoperative evaluation to rule out surgical contraindication 06/18/2013  ? CAD (coronary artery disease) 06/01/2013  ? GERD (gastroesophageal reflux disease) 06/01/2013  ? Hypercholesterolemia 06/01/2013  ? Tubular adenoma of  colon 06/01/2013  ? Major depressive disorder in remission ([place]) 06/01/2013  ? ? ?Conditions to be addressed/monitored:CHF, COPD, and ANEMIA ? ?Care Plan : [facility]   General Plan of Care (Adult)  ?Updates made by T

## 2022-02-10 DIAGNOSIS — Z95811 Presence of heart assist device: Secondary | ICD-10-CM | POA: Diagnosis not present

## 2022-02-10 DIAGNOSIS — I428 Other cardiomyopathies: Secondary | ICD-10-CM | POA: Diagnosis not present

## 2022-02-10 DIAGNOSIS — Z4801 Encounter for change or removal of surgical wound dressing: Secondary | ICD-10-CM | POA: Diagnosis not present

## 2022-02-10 DIAGNOSIS — Z48812 Encounter for surgical aftercare following surgery on the circulatory system: Secondary | ICD-10-CM | POA: Diagnosis not present

## 2022-02-13 ENCOUNTER — Other Ambulatory Visit: Payer: Self-pay | Admitting: Internal Medicine

## 2022-02-20 ENCOUNTER — Ambulatory Visit (INDEPENDENT_AMBULATORY_CARE_PROVIDER_SITE_OTHER): Payer: Medicare Other | Admitting: *Deleted

## 2022-02-20 DIAGNOSIS — D62 Acute posthemorrhagic anemia: Secondary | ICD-10-CM

## 2022-02-20 DIAGNOSIS — J441 Chronic obstructive pulmonary disease with (acute) exacerbation: Secondary | ICD-10-CM

## 2022-02-20 DIAGNOSIS — I5032 Chronic diastolic (congestive) heart failure: Secondary | ICD-10-CM

## 2022-02-20 NOTE — Chronic Care Management (AMB) (Signed)
?Chronic Care Management  ? ?CCM RN Visit Note ? ?02/20/2022 ?Name: Ashley [name] MRN: [id number] DOB: 1951-08-20 ? ?Subjective: ?Ashley [name] is a 71 y.o. year old female who is a primary care patient of [name], [name], MD. The care management team was consulted for assistance with disease management and care coordination needs.   ? ?Engaged with patient by telephone for follow up visit in response to provider referral for case management and/or care coordination services.  ? ?Consent to Services:  ?The patient was given information about Chronic Care Management services, agreed to services, and gave verbal consent prior to initiation of services.  Please see initial visit note for detailed documentation.  ? ?Patient agreed to services and verbal consent obtained.  ? ?Assessment: Review of patient past medical history, allergies, medications, health status, including review of consultants reports, laboratory and other test data, was performed as part of comprehensive evaluation and provision of chronic care management services.  ? ?SDOH (Social Determinants of Health) assessments and interventions performed:   ? ?CCM Care Plan ? ?Allergies  ?Allergen Reactions  ? Oxycodone   ?  Other reaction(s): Hallucination ?Hallucinations to oxycodone noted while inpatient 06/2021. Tolerates hydromorphone.   ? Vancomycin Nausea And Vomiting and Palpitations  ? Hydrocodone-Acetaminophen Nausea Only  ? ? ?Outpatient Encounter Medications as of 02/20/2022  ?Medication Sig Note  ? acetaminophen (TYLENOL) 325 MG tablet Take 650 mg by mouth every 6 (six) hours as needed.   ? albuterol (PROAIR HFA) 108 (90 Base) MCG/ACT inhaler Inhale 1-2 puffs into the lungs every 6 (six) hours as needed for wheezing or shortness of breath.   ? allopurinol (ZYLOPRIM) 100 MG tablet TAKE ONE TABLET BY MOUTH ONCE DAILY   ? amitriptyline (ELAVIL) 25 MG tablet TAKE 1 TABLET BY MOUTH EVERY NIGHT AT BEDTIME   ? amLODipine (NORVASC) 2.5 MG tablet Take  2.5 mg by mouth daily.   ? atorvastatin (LIPITOR) 40 MG tablet TAKE ONE TABLET BY MOUTH ONCE DAILY   ? carvedilol (COREG) 12.5 MG tablet SMARTSIG:1 Tablet(s) By Mouth Every 12 Hours   ? carvedilol (COREG) 25 MG tablet Take 25 mg by mouth 2 (two) times daily. (Patient not taking: Reported on 12/21/2021)   ? colchicine 0.6 MG tablet  01/03/2021: Reports taking only when gout flares up  ? escitalopram (LEXAPRO) 10 MG tablet TAKE ONE TABLET BY MOUTH ONCE DAILY   ? ferrous sulfate 325 (65 FE) MG tablet Take 325 mg by mouth daily with breakfast.   ? fluticasone (FLONASE) 50 MCG/ACT nasal spray Place 1 spray into both nostrils as needed for allergies or rhinitis.   ? folic acid (FOLVITE) 1 MG tablet Take 1 tablet (1 mg total) by mouth daily.   ? GNP MUCUS ER 600 MG 12 hr tablet Take 600 mg by mouth 2 (two) times daily. 01/03/2021: Reports taking as needed  ? HYDROmorphone (DILAUDID) 2 MG tablet Take 1 tablet (2 mg total) by mouth every 6 (six) hours as needed. FOR CHRONIC LOW BACK PAIN   ? ipratropium-albuterol (DUONEB) 0.5-2.5 (3) MG/3ML SOLN USE 1 VIAL VIA NEBULIZER AND INHALE BY MOUTH INTO THE LUNGS EVERY 6 HOURS AS NEEDED   ? levothyroxine (SYNTHROID) 75 MCG tablet Take 1 tablet (75 mcg total) by mouth daily. 12/16/2021: REPORTS DOSE IS 50 MCG DAILY  ? lisinopril (ZESTRIL) 5 MG tablet Take 5 mg by mouth daily.   ? magnesium oxide (MAG-OX) 400 (241.3 Mg) MG tablet Take 1 tablet by mouth every Monday, Wednesday,  and Friday.   ? nitroGLYCERIN (NITROSTAT) 0.4 MG SL tablet Place under the tongue.   ? pantoprazole (PROTONIX) 40 MG tablet Take 40 mg by mouth 2 (two) times daily.   ? potassium chloride (KLOR-CON) 10 MEQ tablet Take 10 mEq by mouth every Monday, Wednesday, and Friday. Take 2 tablets every Monday Wednesday Friday 08/25/2021: PRN with fluid pill  ? sacubitril-valsartan (ENTRESTO) 97-103 MG Take 1 tablet by mouth 2 (two) times daily.   ? spironolactone (ALDACTONE) 25 MG tablet Take 25 mg by mouth daily.   ? tiZANidine  (ZANAFLEX) 2 MG tablet TAKE ONE TABLET BY MOUTH EVERY 6 HOURS AS NEEDED FOR MUSCLE SPASM   ? torsemide (DEMADEX) 20 MG tablet Take 20 mg by mouth daily. 08/25/2021: PRN  ? traZODone (DESYREL) 50 MG tablet TAKE 1/2 TO 1 TABLET BY MOUTH EVERY EVENING 1 HOUR BEFORE BEDTIME   ? TRELEGY ELLIPTA 100-62.5-25 MCG/INH AEPB INHALE 1 PUFF BY MOUTH INTO THE LUNGS EVERY DAY   ? warfarin (COUMADIN) 2 MG tablet Take 4 mg by mouth at bedtime. Every day except on Thursday take '8mg'$ .   ? ?No facility-administered encounter medications on file as of 02/20/2022.  ? ? ?Patient Active Problem List  ? Diagnosis Date Noted  ? Low back pain without sciatica 08/25/2021  ? Osteoporosis with current pathological fracture 08/25/2021  ? Muscle strain of left upper back 07/22/2021  ? Sequela, post-stroke 07/22/2021  ? S/P split thickness skin graft 07/11/2021  ? Hematoma 06/11/2021  ? Embolic stroke ([place]) 26/83/4196  ? Brachial artery occlusion, left ([place]) 06/02/2021  ? History of embolectomy 06/02/2021  ? History of fasciotomy 06/02/2021  ? Left arm pain 06/02/2021  ? Malignant neoplasm of colon, unspecified part of colon ([place]) 05/06/2021  ? Neuropathy 05/06/2021  ? PAF (paroxysmal atrial fibrillation) ([place]) 02/10/2021  ? Palpitation 02/10/2021  ? Red blood cell antibody positive 12/27/2020  ? Sternal wound infection 12/23/2020  ? Anticoagulation goal of INR 2 to 3 12/23/2020  ? LVAD (left ventricular assist device) present ([place]) 12/23/2020  ? Complication involving left ventricular assist device (LVAD) 12/23/2020  ? Hypothyroidism 12/23/2020  ? History of gastrointestinal bleeding 10/18/2020  ? Left ventricular assist device (LVAD) complication, initial encounter 09/23/2020  ? Infection following procedure 09/03/2020  ? Chronic anticoagulation 08/03/2020  ? S/P MVR (mitral valve replacement) 07/16/2020  ? PHT (pulmonary hypertension) ([place]) 07/07/2020  ? Snoring 07/05/2020  ? 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 Ischemic cardiomyopathy 01/02/2020  ? CKD (chronic kidney disease), stage IIIa 12/31/2019  ? Pulmonary edema cardiac cause ([place]) 12/25/2019  ? Hyperlipidemia   ? Gouty arthritis of both feet 11/20/2019  ? Mitral stenosis with insufficiency   ? Hypertensive urgency   ? Depression   ? Hyperglycemia 09/22/2019  ? Acute respiratory failure with hypoxia ([place]) 09/22/2019  ? Presence of permanent cardiac pacemaker   ? Acute blood loss anemia   ? Chronic kidney disease   ? Hypertension   ? CHF (congestive heart failure) ([place]) 07/16/2019  ? Insomnia 05/07/2019  ? Suspected COVID-19 virus infection 03/23/2019  ? AVM (arteriovenous malformation) of small bowel, acquired   ? Anemia, iron deficiency 11/10/2018  ? Rectal polyp   ? Benign neoplasm of cecum   ? Barrett's esophagus without dysplasia   ? Chronic diastolic heart failure ([place]) 07/03/2018  ? HTN (hypertension) 07/03/2018  ? COPD with emphysema ([place]) 06/28/2018  ? B12 deficiency anemia 06/28/2018  ? Personal history  of colon cancer   ? Polyp of sigmoid colon   ? Diverticulosis of large intestine without diverticulitis   ? 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  ? Renal artery stenosis ([place]) 10/03/2016  ? Encounter for assessment of implantable cardioverter-defibrillator (ICD) 05/06/2015  ? Failure of implantable cardioverter-defibrillator (ICD) lead 02/09/2015  ? Hospital discharge follow-up 02/09/2015  ? Cardiac resynchronization therapy defibrillator (CRT-D) in place 01/09/2015  ? Peripheral artery disease ([place]) 01/05/2015  ? Tobacco abuse 10/11/2014  ? S/P CABG x 3 07/03/2014  ? MI (myocardial infarction) ([place]) 07/03/2014  ? Atherosclerosis of native artery of extremity with intermittent claudication ([place]) 06/18/2013  ? Preoperative evaluation to rule out surgical contraindication 06/18/2013  ? CAD (coronary artery disease) 06/01/2013  ? GERD (gastroesophageal reflux disease) 06/01/2013  ? Hypercholesterolemia 06/01/2013  ? Tubular adenoma of colon  06/01/2013  ? Major depressive disorder in remission ([place]) 06/01/2013  ? ? ?Conditions to be addressed/monitored:CHF, COPD, and anemia ? ?Care Plan : [facility]   General Plan of Care (Adult)  ?Updates made by [place]

## 2022-02-20 NOTE — Patient Instructions (Addendum)
Visit Information ? ?Thank you for taking time to visit with me today. Please don't hesitate to contact me if I can be of assistance to you before our next scheduled telephone appointment. ? ?Following are the goals we discussed today:  ?ent Goals/Self-Care Activities: ?Patient will self administer medications as prescribed as evidenced by self report/primary caregiver report  ?Patient will attend all scheduled provider appointments as evidenced by clinician review of documented attendance to scheduled appointments and patient/caregiver report ?Patient will call provider office for new concerns or questions as evidenced by review of documented incoming telephone call notes and patient report ?call office if I gain more than 2 pounds in one day or 5 pounds in one week ?keep legs up while sitting ?track weight in diary ?use salt in moderation ?weigh myself daily ?follow rescue plan if symptoms flare-up ?track symptoms and what helps feel better or worse ?- identify and remove indoor air pollutants ?- limit outdoor activity during cold weather ?- eliminate symptom triggers at home ?- follow rescue plan if symptoms flare-up ?Contact provider for increase use of rescue inhaler/nebulizer (use rescue inhaler and nebulizer for increase in shortness of breath) ?Call LVAD team for increase in shortness of breath on day 1 or 2 instead of waiting days later when it is worse ?Call LVAD team for any LVAD alarms/recent alarm ASAP ? ?Our next appointment is by telephone on 5/12 at 1230 ? ?Please call the care guide team at [phone] if you need to cancel or reschedule your appointment.  ? ?If you are experiencing a Mental Health or [place] or need someone to talk to, please call the Suicide and Crisis Lifeline: 988 ?call the [place] National Suicide Prevention Lifeline: 7692561197 or TTY: (820)554-3997 TTY 859 748 6797) to talk to a trained counselor ?call 1-800-273-TALK (toll free, 24 hour hotline) ?call 911   ? ?Patient verbalizes understanding of instructions and care plan provided today and agrees to view in [place]. Active MyChart status confirmed with patient.   ? ?Hubert Azure RN, MSN ?RN Care Management Coordinator ?[place] ?[phone] ?[email or web address] ?  ?

## 2022-02-21 DIAGNOSIS — Z7901 Long term (current) use of anticoagulants: Secondary | ICD-10-CM | POA: Diagnosis not present

## 2022-02-21 DIAGNOSIS — Z952 Presence of prosthetic heart valve: Secondary | ICD-10-CM | POA: Diagnosis not present

## 2022-02-23 ENCOUNTER — Other Ambulatory Visit: Payer: Self-pay

## 2022-02-23 MED ORDER — AMITRIPTYLINE HCL 25 MG PO TABS: 25.0000 mg | ORAL_TABLET | Freq: Every day | ORAL | 0 refills | Status: DC

## 2022-02-23 MED ORDER — ALBUTEROL SULFATE HFA 108 (90 BASE) MCG/ACT IN AERS: 1.0000 | INHALATION_SPRAY | Freq: Four times a day (QID) | RESPIRATORY_TRACT | 2 refills | Status: DC | PRN

## 2022-02-23 MED ORDER — LEVOTHYROXINE SODIUM 75 MCG PO TABS: 75.0000 ug | ORAL_TABLET | Freq: Every day | ORAL | 1 refills | Status: AC

## 2022-03-05 DIAGNOSIS — I5032 Chronic diastolic (congestive) heart failure: Secondary | ICD-10-CM | POA: Diagnosis not present

## 2022-03-05 DIAGNOSIS — J441 Chronic obstructive pulmonary disease with (acute) exacerbation: Secondary | ICD-10-CM

## 2022-03-05 DIAGNOSIS — D62 Acute posthemorrhagic anemia: Secondary | ICD-10-CM

## 2022-03-09 DIAGNOSIS — I130 Hypertensive heart and chronic kidney disease with heart failure and stage 1 through stage 4 chronic kidney disease, or unspecified chronic kidney disease: Secondary | ICD-10-CM | POA: Diagnosis not present

## 2022-03-09 DIAGNOSIS — N183 Chronic kidney disease, stage 3 unspecified: Secondary | ICD-10-CM | POA: Diagnosis not present

## 2022-03-09 DIAGNOSIS — I5084 End stage heart failure: Secondary | ICD-10-CM | POA: Diagnosis not present

## 2022-03-09 DIAGNOSIS — E1122 Type 2 diabetes mellitus with diabetic chronic kidney disease: Secondary | ICD-10-CM | POA: Diagnosis not present

## 2022-03-09 DIAGNOSIS — Z85038 Personal history of other malignant neoplasm of large intestine: Secondary | ICD-10-CM | POA: Diagnosis not present

## 2022-03-09 DIAGNOSIS — I2510 Atherosclerotic heart disease of native coronary artery without angina pectoris: Secondary | ICD-10-CM | POA: Diagnosis not present

## 2022-03-09 DIAGNOSIS — E1151 Type 2 diabetes mellitus with diabetic peripheral angiopathy without gangrene: Secondary | ICD-10-CM | POA: Diagnosis not present

## 2022-03-09 DIAGNOSIS — J440 Chronic obstructive pulmonary disease with acute lower respiratory infection: Secondary | ICD-10-CM | POA: Diagnosis not present

## 2022-03-09 DIAGNOSIS — Z9049 Acquired absence of other specified parts of digestive tract: Secondary | ICD-10-CM | POA: Diagnosis not present

## 2022-03-09 DIAGNOSIS — Z4502 Encounter for adjustment and management of automatic implantable cardiac defibrillator: Secondary | ICD-10-CM | POA: Diagnosis not present

## 2022-03-09 DIAGNOSIS — F17200 Nicotine dependence, unspecified, uncomplicated: Secondary | ICD-10-CM | POA: Diagnosis not present

## 2022-03-09 DIAGNOSIS — I5022 Chronic systolic (congestive) heart failure: Secondary | ICD-10-CM | POA: Diagnosis not present

## 2022-03-09 DIAGNOSIS — K922 Gastrointestinal hemorrhage, unspecified: Secondary | ICD-10-CM | POA: Diagnosis not present

## 2022-03-09 DIAGNOSIS — Z95810 Presence of automatic (implantable) cardiac defibrillator: Secondary | ICD-10-CM | POA: Diagnosis not present

## 2022-03-09 DIAGNOSIS — I255 Ischemic cardiomyopathy: Secondary | ICD-10-CM | POA: Diagnosis not present

## 2022-03-09 DIAGNOSIS — Z951 Presence of aortocoronary bypass graft: Secondary | ICD-10-CM | POA: Diagnosis not present

## 2022-03-17 ENCOUNTER — Telehealth: Payer: Self-pay | Admitting: *Deleted

## 2022-03-17 ENCOUNTER — Telehealth: Payer: Medicare Other

## 2022-03-17 NOTE — Telephone Encounter (Signed)
?  Care Management  ? ?Follow Up Note ? ? ?03/17/2022 ?Name: Ashley [name] MRN: [id number] DOB: [birth year] ? ? ?Referred by: [name], MD ?Reason for referral : Chronic Care Management (GI BLEED, CHF                                                                                                                          ) ? ? ?An unsuccessful telephone outreach was attempted today. The patient was referred to the case management team for assistance with care management and care coordination.  ? ?Follow Up Plan: RNCM will seek assistance from Care Guides in rescheduling appointment within the next 30 days. ? ?Hubert Azure RN, MSN ?RN Care Management Coordinator ?[place] ?[phone] ?[email or web address] ? ?

## 2022-03-21 ENCOUNTER — Telehealth: Payer: Self-pay

## 2022-03-21 NOTE — Chronic Care Management (AMB) (Signed)
  Chronic Care Management Note  03/21/2022 Name: Ashley [name] MRN: [id number] DOB: [birth year]  Ashley [name] is a 71 y.o. year old female who is a primary care patient of Derrel Nip, [name], MD and is actively engaged with the care management team. I reached out to Ashley [name] by phone today to assist with re-scheduling a follow up visit with the RN Case Manager  Follow up plan: Unsuccessful telephone outreach attempt made. A HIPAA compliant phone message was left for the patient providing contact information and requesting a return call.  The care management team will reach out to the patient again over the next 7 days.  If patient returns call to provider office, please advise to call [facility]  at [place], [place], [place] [facility]  [place], [place] [address] Direct Dial: [phone] [email or web address] Website: [email or web address]

## 2022-03-22 DIAGNOSIS — Z95811 Presence of heart assist device: Secondary | ICD-10-CM | POA: Diagnosis not present

## 2022-03-22 DIAGNOSIS — I428 Other cardiomyopathies: Secondary | ICD-10-CM | POA: Diagnosis not present

## 2022-03-22 DIAGNOSIS — Z4801 Encounter for change or removal of surgical wound dressing: Secondary | ICD-10-CM | POA: Diagnosis not present

## 2022-03-22 DIAGNOSIS — Z48812 Encounter for surgical aftercare following surgery on the circulatory system: Secondary | ICD-10-CM | POA: Diagnosis not present

## 2022-03-24 DIAGNOSIS — R918 Other nonspecific abnormal finding of lung field: Secondary | ICD-10-CM | POA: Diagnosis not present

## 2022-03-24 DIAGNOSIS — Z7901 Long term (current) use of anticoagulants: Secondary | ICD-10-CM | POA: Diagnosis not present

## 2022-03-24 DIAGNOSIS — F32A Depression, unspecified: Secondary | ICD-10-CM | POA: Diagnosis not present

## 2022-03-24 DIAGNOSIS — I361 Nonrheumatic tricuspid (valve) insufficiency: Secondary | ICD-10-CM | POA: Diagnosis not present

## 2022-03-24 DIAGNOSIS — Z9911 Dependence on respirator [ventilator] status: Secondary | ICD-10-CM | POA: Diagnosis not present

## 2022-03-24 DIAGNOSIS — I469 Cardiac arrest, cause unspecified: Secondary | ICD-10-CM | POA: Diagnosis not present

## 2022-03-24 DIAGNOSIS — I10 Essential (primary) hypertension: Secondary | ICD-10-CM | POA: Diagnosis not present

## 2022-03-24 DIAGNOSIS — Z951 Presence of aortocoronary bypass graft: Secondary | ICD-10-CM | POA: Diagnosis not present

## 2022-03-24 DIAGNOSIS — G479 Sleep disorder, unspecified: Secondary | ICD-10-CM | POA: Diagnosis not present

## 2022-03-24 DIAGNOSIS — R197 Diarrhea, unspecified: Secondary | ICD-10-CM | POA: Diagnosis not present

## 2022-03-24 DIAGNOSIS — N179 Acute kidney failure, unspecified: Secondary | ICD-10-CM | POA: Diagnosis not present

## 2022-03-24 DIAGNOSIS — I952 Hypotension due to drugs: Secondary | ICD-10-CM | POA: Diagnosis not present

## 2022-03-24 DIAGNOSIS — I701 Atherosclerosis of renal artery: Secondary | ICD-10-CM | POA: Diagnosis not present

## 2022-03-24 DIAGNOSIS — J811 Chronic pulmonary edema: Secondary | ICD-10-CM | POA: Diagnosis not present

## 2022-03-24 DIAGNOSIS — K259 Gastric ulcer, unspecified as acute or chronic, without hemorrhage or perforation: Secondary | ICD-10-CM | POA: Diagnosis not present

## 2022-03-24 DIAGNOSIS — I255 Ischemic cardiomyopathy: Secondary | ICD-10-CM | POA: Diagnosis not present

## 2022-03-24 DIAGNOSIS — N183 Chronic kidney disease, stage 3 unspecified: Secondary | ICD-10-CM | POA: Diagnosis not present

## 2022-03-24 DIAGNOSIS — Z9049 Acquired absence of other specified parts of digestive tract: Secondary | ICD-10-CM | POA: Diagnosis not present

## 2022-03-24 DIAGNOSIS — E1151 Type 2 diabetes mellitus with diabetic peripheral angiopathy without gangrene: Secondary | ICD-10-CM | POA: Diagnosis not present

## 2022-03-24 DIAGNOSIS — I493 Ventricular premature depolarization: Secondary | ICD-10-CM | POA: Diagnosis not present

## 2022-03-24 DIAGNOSIS — K921 Melena: Secondary | ICD-10-CM | POA: Diagnosis not present

## 2022-03-24 DIAGNOSIS — Z95810 Presence of automatic (implantable) cardiac defibrillator: Secondary | ICD-10-CM | POA: Diagnosis not present

## 2022-03-24 DIAGNOSIS — D6800 Von Willebrand disease, unspecified: Secondary | ICD-10-CM | POA: Diagnosis not present

## 2022-03-24 DIAGNOSIS — I130 Hypertensive heart and chronic kidney disease with heart failure and stage 1 through stage 4 chronic kidney disease, or unspecified chronic kidney disease: Secondary | ICD-10-CM | POA: Diagnosis not present

## 2022-03-24 DIAGNOSIS — R0689 Other abnormalities of breathing: Secondary | ICD-10-CM | POA: Diagnosis not present

## 2022-03-24 DIAGNOSIS — Z953 Presence of xenogenic heart valve: Secondary | ICD-10-CM | POA: Diagnosis not present

## 2022-03-24 DIAGNOSIS — I5022 Chronic systolic (congestive) heart failure: Secondary | ICD-10-CM | POA: Diagnosis not present

## 2022-03-24 DIAGNOSIS — R6889 Other general symptoms and signs: Secondary | ICD-10-CM | POA: Diagnosis not present

## 2022-03-24 DIAGNOSIS — K31811 Angiodysplasia of stomach and duodenum with bleeding: Secondary | ICD-10-CM | POA: Diagnosis not present

## 2022-03-24 DIAGNOSIS — J449 Chronic obstructive pulmonary disease, unspecified: Secondary | ICD-10-CM | POA: Diagnosis not present

## 2022-03-24 DIAGNOSIS — E039 Hypothyroidism, unspecified: Secondary | ICD-10-CM | POA: Diagnosis not present

## 2022-03-24 DIAGNOSIS — I480 Paroxysmal atrial fibrillation: Secondary | ICD-10-CM | POA: Diagnosis not present

## 2022-03-24 DIAGNOSIS — T82190A Other mechanical complication of cardiac electrode, initial encounter: Secondary | ICD-10-CM | POA: Diagnosis not present

## 2022-03-24 DIAGNOSIS — I160 Hypertensive urgency: Secondary | ICD-10-CM | POA: Diagnosis not present

## 2022-03-24 DIAGNOSIS — I272 Pulmonary hypertension, unspecified: Secondary | ICD-10-CM | POA: Diagnosis not present

## 2022-03-24 DIAGNOSIS — K219 Gastro-esophageal reflux disease without esophagitis: Secondary | ICD-10-CM | POA: Diagnosis not present

## 2022-03-24 DIAGNOSIS — I25810 Atherosclerosis of coronary artery bypass graft(s) without angina pectoris: Secondary | ICD-10-CM | POA: Diagnosis not present

## 2022-03-24 DIAGNOSIS — Z743 Need for continuous supervision: Secondary | ICD-10-CM | POA: Diagnosis not present

## 2022-03-24 DIAGNOSIS — D62 Acute posthemorrhagic anemia: Secondary | ICD-10-CM | POA: Diagnosis not present

## 2022-03-24 DIAGNOSIS — I499 Cardiac arrhythmia, unspecified: Secondary | ICD-10-CM | POA: Diagnosis not present

## 2022-03-24 DIAGNOSIS — I509 Heart failure, unspecified: Secondary | ICD-10-CM | POA: Diagnosis not present

## 2022-03-24 DIAGNOSIS — I2510 Atherosclerotic heart disease of native coronary artery without angina pectoris: Secondary | ICD-10-CM | POA: Diagnosis not present

## 2022-03-24 DIAGNOSIS — Z4502 Encounter for adjustment and management of automatic implantable cardiac defibrillator: Secondary | ICD-10-CM | POA: Diagnosis not present

## 2022-03-24 DIAGNOSIS — D696 Thrombocytopenia, unspecified: Secondary | ICD-10-CM | POA: Diagnosis not present

## 2022-03-24 DIAGNOSIS — Z952 Presence of prosthetic heart valve: Secondary | ICD-10-CM | POA: Diagnosis not present

## 2022-03-24 DIAGNOSIS — J9601 Acute respiratory failure with hypoxia: Secondary | ICD-10-CM | POA: Diagnosis not present

## 2022-03-24 DIAGNOSIS — Z95811 Presence of heart assist device: Secondary | ICD-10-CM | POA: Diagnosis not present

## 2022-03-24 DIAGNOSIS — I161 Hypertensive emergency: Secondary | ICD-10-CM | POA: Diagnosis not present

## 2022-03-24 DIAGNOSIS — I428 Other cardiomyopathies: Secondary | ICD-10-CM | POA: Diagnosis not present

## 2022-03-24 DIAGNOSIS — I420 Dilated cardiomyopathy: Secondary | ICD-10-CM | POA: Diagnosis not present

## 2022-03-24 DIAGNOSIS — I50810 Right heart failure, unspecified: Secondary | ICD-10-CM | POA: Diagnosis not present

## 2022-03-24 DIAGNOSIS — E1122 Type 2 diabetes mellitus with diabetic chronic kidney disease: Secondary | ICD-10-CM | POA: Diagnosis not present

## 2022-03-24 DIAGNOSIS — I517 Cardiomegaly: Secondary | ICD-10-CM | POA: Diagnosis not present

## 2022-03-24 DIAGNOSIS — E785 Hyperlipidemia, unspecified: Secondary | ICD-10-CM | POA: Diagnosis not present

## 2022-03-24 DIAGNOSIS — I739 Peripheral vascular disease, unspecified: Secondary | ICD-10-CM | POA: Diagnosis not present

## 2022-03-25 DIAGNOSIS — K921 Melena: Secondary | ICD-10-CM | POA: Diagnosis not present

## 2022-03-25 DIAGNOSIS — I130 Hypertensive heart and chronic kidney disease with heart failure and stage 1 through stage 4 chronic kidney disease, or unspecified chronic kidney disease: Secondary | ICD-10-CM | POA: Diagnosis not present

## 2022-03-25 DIAGNOSIS — I739 Peripheral vascular disease, unspecified: Secondary | ICD-10-CM | POA: Diagnosis not present

## 2022-03-25 DIAGNOSIS — I160 Hypertensive urgency: Secondary | ICD-10-CM | POA: Diagnosis not present

## 2022-03-25 DIAGNOSIS — E1122 Type 2 diabetes mellitus with diabetic chronic kidney disease: Secondary | ICD-10-CM | POA: Diagnosis not present

## 2022-03-25 DIAGNOSIS — Z95811 Presence of heart assist device: Secondary | ICD-10-CM | POA: Diagnosis not present

## 2022-03-25 DIAGNOSIS — I428 Other cardiomyopathies: Secondary | ICD-10-CM | POA: Diagnosis not present

## 2022-03-25 DIAGNOSIS — I469 Cardiac arrest, cause unspecified: Secondary | ICD-10-CM | POA: Diagnosis not present

## 2022-03-25 DIAGNOSIS — I25810 Atherosclerosis of coronary artery bypass graft(s) without angina pectoris: Secondary | ICD-10-CM | POA: Diagnosis not present

## 2022-03-25 DIAGNOSIS — G479 Sleep disorder, unspecified: Secondary | ICD-10-CM | POA: Diagnosis not present

## 2022-03-25 DIAGNOSIS — J9601 Acute respiratory failure with hypoxia: Secondary | ICD-10-CM | POA: Diagnosis not present

## 2022-03-25 DIAGNOSIS — D62 Acute posthemorrhagic anemia: Secondary | ICD-10-CM | POA: Diagnosis not present

## 2022-03-25 DIAGNOSIS — J449 Chronic obstructive pulmonary disease, unspecified: Secondary | ICD-10-CM | POA: Diagnosis not present

## 2022-03-25 DIAGNOSIS — N183 Chronic kidney disease, stage 3 unspecified: Secondary | ICD-10-CM | POA: Diagnosis not present

## 2022-03-25 DIAGNOSIS — I5022 Chronic systolic (congestive) heart failure: Secondary | ICD-10-CM | POA: Diagnosis not present

## 2022-03-26 DIAGNOSIS — D62 Acute posthemorrhagic anemia: Secondary | ICD-10-CM | POA: Diagnosis not present

## 2022-03-26 DIAGNOSIS — Z9049 Acquired absence of other specified parts of digestive tract: Secondary | ICD-10-CM | POA: Diagnosis not present

## 2022-03-26 DIAGNOSIS — I50810 Right heart failure, unspecified: Secondary | ICD-10-CM | POA: Diagnosis not present

## 2022-03-26 DIAGNOSIS — Z951 Presence of aortocoronary bypass graft: Secondary | ICD-10-CM | POA: Diagnosis not present

## 2022-03-26 DIAGNOSIS — K921 Melena: Secondary | ICD-10-CM | POA: Diagnosis not present

## 2022-03-26 DIAGNOSIS — I272 Pulmonary hypertension, unspecified: Secondary | ICD-10-CM | POA: Diagnosis not present

## 2022-03-26 DIAGNOSIS — I25810 Atherosclerosis of coronary artery bypass graft(s) without angina pectoris: Secondary | ICD-10-CM | POA: Diagnosis not present

## 2022-03-26 DIAGNOSIS — E785 Hyperlipidemia, unspecified: Secondary | ICD-10-CM | POA: Diagnosis not present

## 2022-03-26 DIAGNOSIS — J449 Chronic obstructive pulmonary disease, unspecified: Secondary | ICD-10-CM | POA: Diagnosis not present

## 2022-03-26 DIAGNOSIS — K31811 Angiodysplasia of stomach and duodenum with bleeding: Secondary | ICD-10-CM | POA: Diagnosis not present

## 2022-03-26 DIAGNOSIS — I952 Hypotension due to drugs: Secondary | ICD-10-CM | POA: Diagnosis not present

## 2022-03-26 DIAGNOSIS — Z95810 Presence of automatic (implantable) cardiac defibrillator: Secondary | ICD-10-CM | POA: Diagnosis not present

## 2022-03-26 DIAGNOSIS — Z9911 Dependence on respirator [ventilator] status: Secondary | ICD-10-CM | POA: Diagnosis not present

## 2022-03-26 DIAGNOSIS — Z952 Presence of prosthetic heart valve: Secondary | ICD-10-CM | POA: Diagnosis not present

## 2022-03-26 DIAGNOSIS — I739 Peripheral vascular disease, unspecified: Secondary | ICD-10-CM | POA: Diagnosis not present

## 2022-03-27 DIAGNOSIS — I5022 Chronic systolic (congestive) heart failure: Secondary | ICD-10-CM | POA: Diagnosis not present

## 2022-03-27 DIAGNOSIS — Z7901 Long term (current) use of anticoagulants: Secondary | ICD-10-CM | POA: Diagnosis not present

## 2022-03-27 DIAGNOSIS — K921 Melena: Secondary | ICD-10-CM | POA: Diagnosis not present

## 2022-03-27 DIAGNOSIS — I420 Dilated cardiomyopathy: Secondary | ICD-10-CM | POA: Diagnosis not present

## 2022-03-27 DIAGNOSIS — I10 Essential (primary) hypertension: Secondary | ICD-10-CM | POA: Diagnosis not present

## 2022-03-27 DIAGNOSIS — I701 Atherosclerosis of renal artery: Secondary | ICD-10-CM | POA: Diagnosis not present

## 2022-03-27 DIAGNOSIS — I255 Ischemic cardiomyopathy: Secondary | ICD-10-CM | POA: Diagnosis not present

## 2022-03-27 DIAGNOSIS — Z95811 Presence of heart assist device: Secondary | ICD-10-CM | POA: Diagnosis not present

## 2022-03-28 DIAGNOSIS — Z7901 Long term (current) use of anticoagulants: Secondary | ICD-10-CM | POA: Diagnosis not present

## 2022-03-28 DIAGNOSIS — N179 Acute kidney failure, unspecified: Secondary | ICD-10-CM | POA: Diagnosis not present

## 2022-03-28 DIAGNOSIS — Z95811 Presence of heart assist device: Secondary | ICD-10-CM | POA: Diagnosis not present

## 2022-03-28 DIAGNOSIS — I10 Essential (primary) hypertension: Secondary | ICD-10-CM | POA: Diagnosis not present

## 2022-03-28 DIAGNOSIS — I701 Atherosclerosis of renal artery: Secondary | ICD-10-CM | POA: Diagnosis not present

## 2022-03-28 DIAGNOSIS — Z4502 Encounter for adjustment and management of automatic implantable cardiac defibrillator: Secondary | ICD-10-CM | POA: Diagnosis not present

## 2022-03-28 NOTE — Chronic Care Management (AMB) (Signed)
  Chronic Care Management Note  03/28/2022 Name: Ashley [name] MRN: [id number] DOB: 02-Mar-1951  Ashley [name] is a 71 y.o. year old female who is a primary care patient of Derrel Nip, [name], MD and is actively engaged with the care management team. I reached out to Ashley [name] by phone today to assist with re-scheduling a follow up visit with the RN Case Manager  Follow up plan: Unsuccessful telephone outreach attempt made. A HIPAA compliant phone message was left for the patient providing contact information and requesting a return call.  The care management team will reach out to the patient again over the next 7 days.  If patient returns call to provider office, please advise to call [facility]  at [place], [place], [place],  [address] Direct Dial: [phone] [email or web address] Website: [email or web address]

## 2022-03-29 DIAGNOSIS — I10 Essential (primary) hypertension: Secondary | ICD-10-CM | POA: Diagnosis not present

## 2022-03-29 DIAGNOSIS — I701 Atherosclerosis of renal artery: Secondary | ICD-10-CM | POA: Diagnosis not present

## 2022-03-29 DIAGNOSIS — Z7901 Long term (current) use of anticoagulants: Secondary | ICD-10-CM | POA: Diagnosis not present

## 2022-03-29 DIAGNOSIS — Z95811 Presence of heart assist device: Secondary | ICD-10-CM | POA: Diagnosis not present

## 2022-03-29 DIAGNOSIS — N179 Acute kidney failure, unspecified: Secondary | ICD-10-CM | POA: Diagnosis not present

## 2022-03-30 DIAGNOSIS — Z95811 Presence of heart assist device: Secondary | ICD-10-CM | POA: Diagnosis not present

## 2022-03-30 DIAGNOSIS — I10 Essential (primary) hypertension: Secondary | ICD-10-CM | POA: Diagnosis not present

## 2022-03-30 DIAGNOSIS — I701 Atherosclerosis of renal artery: Secondary | ICD-10-CM | POA: Diagnosis not present

## 2022-03-30 DIAGNOSIS — N179 Acute kidney failure, unspecified: Secondary | ICD-10-CM | POA: Diagnosis not present

## 2022-03-31 DIAGNOSIS — Z95811 Presence of heart assist device: Secondary | ICD-10-CM | POA: Diagnosis not present

## 2022-03-31 DIAGNOSIS — N179 Acute kidney failure, unspecified: Secondary | ICD-10-CM | POA: Diagnosis not present

## 2022-03-31 DIAGNOSIS — I701 Atherosclerosis of renal artery: Secondary | ICD-10-CM | POA: Diagnosis not present

## 2022-03-31 DIAGNOSIS — I10 Essential (primary) hypertension: Secondary | ICD-10-CM | POA: Diagnosis not present

## 2022-04-01 DIAGNOSIS — I701 Atherosclerosis of renal artery: Secondary | ICD-10-CM | POA: Diagnosis not present

## 2022-04-01 DIAGNOSIS — Z95811 Presence of heart assist device: Secondary | ICD-10-CM | POA: Diagnosis not present

## 2022-04-01 DIAGNOSIS — N179 Acute kidney failure, unspecified: Secondary | ICD-10-CM | POA: Diagnosis not present

## 2022-04-04 DIAGNOSIS — Z7901 Long term (current) use of anticoagulants: Secondary | ICD-10-CM | POA: Diagnosis not present

## 2022-04-04 DIAGNOSIS — Z952 Presence of prosthetic heart valve: Secondary | ICD-10-CM | POA: Diagnosis not present

## 2022-04-06 NOTE — Chronic Care Management (AMB) (Signed)
  Chronic Care Management Note  04/06/2022 Name: Ashley [name] MRN: [id number] DOB: August 27, 1951  Ashley [name] is a 71 y.o. year old female who is a primary care patient of Derrel Nip, [name], MD and is actively engaged with the care management team. I reached out to Ashley [name] by phone today to assist with re-scheduling a follow up visit with the RN Case Manager  Follow up plan: Unable to make contact on outreach attempts x 3. PCP [name], MD notified via routed documentation in medical record.   Noreene Larsson, [place], [place], [place] [address] Direct Dial: [phone] [email or web address] Website: [email or web address]

## 2022-04-10 DIAGNOSIS — I255 Ischemic cardiomyopathy: Secondary | ICD-10-CM | POA: Diagnosis not present

## 2022-04-10 DIAGNOSIS — Z20822 Contact with and (suspected) exposure to covid-19: Secondary | ICD-10-CM | POA: Diagnosis not present

## 2022-04-10 DIAGNOSIS — I5084 End stage heart failure: Secondary | ICD-10-CM | POA: Diagnosis not present

## 2022-04-10 DIAGNOSIS — J9601 Acute respiratory failure with hypoxia: Secondary | ICD-10-CM | POA: Diagnosis not present

## 2022-04-10 DIAGNOSIS — E119 Type 2 diabetes mellitus without complications: Secondary | ICD-10-CM | POA: Diagnosis not present

## 2022-04-10 DIAGNOSIS — I10 Essential (primary) hypertension: Secondary | ICD-10-CM | POA: Diagnosis not present

## 2022-04-10 DIAGNOSIS — I5023 Acute on chronic systolic (congestive) heart failure: Secondary | ICD-10-CM | POA: Diagnosis not present

## 2022-04-10 DIAGNOSIS — E872 Acidosis, unspecified: Secondary | ICD-10-CM | POA: Diagnosis not present

## 2022-04-10 DIAGNOSIS — I480 Paroxysmal atrial fibrillation: Secondary | ICD-10-CM | POA: Diagnosis not present

## 2022-04-10 DIAGNOSIS — T827XXA Infection and inflammatory reaction due to other cardiac and vascular devices, implants and grafts, initial encounter: Secondary | ICD-10-CM | POA: Diagnosis not present

## 2022-04-10 DIAGNOSIS — I270 Primary pulmonary hypertension: Secondary | ICD-10-CM | POA: Diagnosis not present

## 2022-04-10 DIAGNOSIS — I4892 Unspecified atrial flutter: Secondary | ICD-10-CM | POA: Diagnosis not present

## 2022-04-10 DIAGNOSIS — Z7901 Long term (current) use of anticoagulants: Secondary | ICD-10-CM | POA: Diagnosis not present

## 2022-04-10 DIAGNOSIS — R0609 Other forms of dyspnea: Secondary | ICD-10-CM | POA: Diagnosis not present

## 2022-04-10 DIAGNOSIS — Z452 Encounter for adjustment and management of vascular access device: Secondary | ICD-10-CM | POA: Diagnosis not present

## 2022-04-10 DIAGNOSIS — I420 Dilated cardiomyopathy: Secondary | ICD-10-CM | POA: Diagnosis not present

## 2022-04-10 DIAGNOSIS — N179 Acute kidney failure, unspecified: Secondary | ICD-10-CM | POA: Diagnosis not present

## 2022-04-10 DIAGNOSIS — Z95810 Presence of automatic (implantable) cardiac defibrillator: Secondary | ICD-10-CM | POA: Diagnosis not present

## 2022-04-10 DIAGNOSIS — R0600 Dyspnea, unspecified: Secondary | ICD-10-CM | POA: Diagnosis not present

## 2022-04-10 DIAGNOSIS — R7402 Elevation of levels of lactic acid dehydrogenase (LDH): Secondary | ICD-10-CM | POA: Diagnosis not present

## 2022-04-10 DIAGNOSIS — R0682 Tachypnea, not elsewhere classified: Secondary | ICD-10-CM | POA: Diagnosis not present

## 2022-04-10 DIAGNOSIS — Z951 Presence of aortocoronary bypass graft: Secondary | ICD-10-CM | POA: Diagnosis not present

## 2022-04-10 DIAGNOSIS — I2510 Atherosclerotic heart disease of native coronary artery without angina pectoris: Secondary | ICD-10-CM | POA: Diagnosis not present

## 2022-04-10 DIAGNOSIS — Z8673 Personal history of transient ischemic attack (TIA), and cerebral infarction without residual deficits: Secondary | ICD-10-CM | POA: Diagnosis not present

## 2022-04-10 DIAGNOSIS — I701 Atherosclerosis of renal artery: Secondary | ICD-10-CM | POA: Diagnosis not present

## 2022-04-10 DIAGNOSIS — I160 Hypertensive urgency: Secondary | ICD-10-CM | POA: Diagnosis not present

## 2022-04-10 DIAGNOSIS — Z8719 Personal history of other diseases of the digestive system: Secondary | ICD-10-CM | POA: Diagnosis not present

## 2022-04-10 DIAGNOSIS — J811 Chronic pulmonary edema: Secondary | ICD-10-CM | POA: Diagnosis not present

## 2022-04-10 DIAGNOSIS — E785 Hyperlipidemia, unspecified: Secondary | ICD-10-CM | POA: Diagnosis not present

## 2022-04-10 DIAGNOSIS — J810 Acute pulmonary edema: Secondary | ICD-10-CM | POA: Diagnosis not present

## 2022-04-10 DIAGNOSIS — Z95811 Presence of heart assist device: Secondary | ICD-10-CM | POA: Diagnosis not present

## 2022-04-10 DIAGNOSIS — R000 Tachycardia, unspecified: Secondary | ICD-10-CM | POA: Diagnosis not present

## 2022-04-10 DIAGNOSIS — M4854XA Collapsed vertebra, not elsewhere classified, thoracic region, initial encounter for fracture: Secondary | ICD-10-CM | POA: Diagnosis not present

## 2022-04-10 DIAGNOSIS — J90 Pleural effusion, not elsewhere classified: Secondary | ICD-10-CM | POA: Diagnosis not present

## 2022-04-10 DIAGNOSIS — D72829 Elevated white blood cell count, unspecified: Secondary | ICD-10-CM | POA: Diagnosis not present

## 2022-04-10 DIAGNOSIS — I517 Cardiomegaly: Secondary | ICD-10-CM | POA: Diagnosis not present

## 2022-04-10 DIAGNOSIS — R197 Diarrhea, unspecified: Secondary | ICD-10-CM | POA: Diagnosis not present

## 2022-04-10 DIAGNOSIS — J449 Chronic obstructive pulmonary disease, unspecified: Secondary | ICD-10-CM | POA: Diagnosis not present

## 2022-04-10 DIAGNOSIS — I110 Hypertensive heart disease with heart failure: Secondary | ICD-10-CM | POA: Diagnosis not present

## 2022-04-11 DIAGNOSIS — R0600 Dyspnea, unspecified: Secondary | ICD-10-CM | POA: Diagnosis not present

## 2022-04-12 DIAGNOSIS — R0609 Other forms of dyspnea: Secondary | ICD-10-CM | POA: Diagnosis not present

## 2022-04-12 DIAGNOSIS — I5023 Acute on chronic systolic (congestive) heart failure: Secondary | ICD-10-CM | POA: Diagnosis not present

## 2022-04-12 DIAGNOSIS — I420 Dilated cardiomyopathy: Secondary | ICD-10-CM | POA: Diagnosis not present

## 2022-04-12 DIAGNOSIS — Z95811 Presence of heart assist device: Secondary | ICD-10-CM | POA: Diagnosis not present

## 2022-04-13 DIAGNOSIS — Z95811 Presence of heart assist device: Secondary | ICD-10-CM | POA: Diagnosis not present

## 2022-04-13 DIAGNOSIS — I517 Cardiomegaly: Secondary | ICD-10-CM | POA: Diagnosis not present

## 2022-04-13 DIAGNOSIS — I701 Atherosclerosis of renal artery: Secondary | ICD-10-CM | POA: Diagnosis not present

## 2022-04-13 DIAGNOSIS — J811 Chronic pulmonary edema: Secondary | ICD-10-CM | POA: Diagnosis not present

## 2022-04-14 DIAGNOSIS — R0600 Dyspnea, unspecified: Secondary | ICD-10-CM | POA: Diagnosis not present

## 2022-04-16 DIAGNOSIS — I5023 Acute on chronic systolic (congestive) heart failure: Secondary | ICD-10-CM | POA: Diagnosis not present

## 2022-04-16 DIAGNOSIS — R0600 Dyspnea, unspecified: Secondary | ICD-10-CM | POA: Diagnosis not present

## 2022-04-16 DIAGNOSIS — Z95810 Presence of automatic (implantable) cardiac defibrillator: Secondary | ICD-10-CM | POA: Diagnosis not present

## 2022-04-17 DIAGNOSIS — I270 Primary pulmonary hypertension: Secondary | ICD-10-CM | POA: Diagnosis not present

## 2022-04-17 DIAGNOSIS — Z95811 Presence of heart assist device: Secondary | ICD-10-CM | POA: Diagnosis not present

## 2022-04-17 DIAGNOSIS — I701 Atherosclerosis of renal artery: Secondary | ICD-10-CM | POA: Diagnosis not present

## 2022-04-17 DIAGNOSIS — R0600 Dyspnea, unspecified: Secondary | ICD-10-CM | POA: Diagnosis not present

## 2022-04-17 DIAGNOSIS — Z8719 Personal history of other diseases of the digestive system: Secondary | ICD-10-CM | POA: Diagnosis not present

## 2022-04-18 DIAGNOSIS — I270 Primary pulmonary hypertension: Secondary | ICD-10-CM | POA: Diagnosis not present

## 2022-04-18 DIAGNOSIS — I701 Atherosclerosis of renal artery: Secondary | ICD-10-CM | POA: Diagnosis not present

## 2022-04-18 DIAGNOSIS — Z95811 Presence of heart assist device: Secondary | ICD-10-CM | POA: Diagnosis not present

## 2022-04-18 DIAGNOSIS — R0600 Dyspnea, unspecified: Secondary | ICD-10-CM | POA: Diagnosis not present

## 2022-04-18 DIAGNOSIS — Z8719 Personal history of other diseases of the digestive system: Secondary | ICD-10-CM | POA: Diagnosis not present

## 2022-04-19 DIAGNOSIS — Z95811 Presence of heart assist device: Secondary | ICD-10-CM | POA: Diagnosis not present

## 2022-04-19 DIAGNOSIS — R0600 Dyspnea, unspecified: Secondary | ICD-10-CM | POA: Diagnosis not present

## 2022-04-19 DIAGNOSIS — Z8719 Personal history of other diseases of the digestive system: Secondary | ICD-10-CM | POA: Diagnosis not present

## 2022-04-19 DIAGNOSIS — I270 Primary pulmonary hypertension: Secondary | ICD-10-CM | POA: Diagnosis not present

## 2022-04-19 DIAGNOSIS — I701 Atherosclerosis of renal artery: Secondary | ICD-10-CM | POA: Diagnosis not present

## 2022-04-19 DIAGNOSIS — I10 Essential (primary) hypertension: Secondary | ICD-10-CM | POA: Diagnosis not present

## 2022-04-20 DIAGNOSIS — R0600 Dyspnea, unspecified: Secondary | ICD-10-CM | POA: Diagnosis not present

## 2022-04-20 DIAGNOSIS — I270 Primary pulmonary hypertension: Secondary | ICD-10-CM | POA: Diagnosis not present

## 2022-04-20 DIAGNOSIS — I701 Atherosclerosis of renal artery: Secondary | ICD-10-CM | POA: Diagnosis not present

## 2022-04-20 DIAGNOSIS — Z95811 Presence of heart assist device: Secondary | ICD-10-CM | POA: Diagnosis not present

## 2022-04-20 DIAGNOSIS — Z8719 Personal history of other diseases of the digestive system: Secondary | ICD-10-CM | POA: Diagnosis not present

## 2022-04-21 DIAGNOSIS — Z95811 Presence of heart assist device: Secondary | ICD-10-CM | POA: Diagnosis not present

## 2022-04-21 DIAGNOSIS — I701 Atherosclerosis of renal artery: Secondary | ICD-10-CM | POA: Diagnosis not present

## 2022-04-21 DIAGNOSIS — R0600 Dyspnea, unspecified: Secondary | ICD-10-CM | POA: Diagnosis not present

## 2022-04-21 DIAGNOSIS — Z8719 Personal history of other diseases of the digestive system: Secondary | ICD-10-CM | POA: Diagnosis not present

## 2022-04-21 DIAGNOSIS — I270 Primary pulmonary hypertension: Secondary | ICD-10-CM | POA: Diagnosis not present

## 2022-04-22 DIAGNOSIS — Z452 Encounter for adjustment and management of vascular access device: Secondary | ICD-10-CM | POA: Diagnosis not present

## 2022-04-22 DIAGNOSIS — Z951 Presence of aortocoronary bypass graft: Secondary | ICD-10-CM | POA: Diagnosis not present

## 2022-04-22 DIAGNOSIS — Z8719 Personal history of other diseases of the digestive system: Secondary | ICD-10-CM | POA: Diagnosis not present

## 2022-04-22 DIAGNOSIS — I270 Primary pulmonary hypertension: Secondary | ICD-10-CM | POA: Diagnosis not present

## 2022-04-22 DIAGNOSIS — Z95811 Presence of heart assist device: Secondary | ICD-10-CM | POA: Diagnosis not present

## 2022-04-22 DIAGNOSIS — J90 Pleural effusion, not elsewhere classified: Secondary | ICD-10-CM | POA: Diagnosis not present

## 2022-04-22 DIAGNOSIS — R0600 Dyspnea, unspecified: Secondary | ICD-10-CM | POA: Diagnosis not present

## 2022-04-22 DIAGNOSIS — J811 Chronic pulmonary edema: Secondary | ICD-10-CM | POA: Diagnosis not present

## 2022-04-22 DIAGNOSIS — I517 Cardiomegaly: Secondary | ICD-10-CM | POA: Diagnosis not present

## 2022-04-22 DIAGNOSIS — I701 Atherosclerosis of renal artery: Secondary | ICD-10-CM | POA: Diagnosis not present

## 2022-04-23 DIAGNOSIS — I701 Atherosclerosis of renal artery: Secondary | ICD-10-CM | POA: Diagnosis not present

## 2022-04-23 DIAGNOSIS — I270 Primary pulmonary hypertension: Secondary | ICD-10-CM | POA: Diagnosis not present

## 2022-04-23 DIAGNOSIS — R0600 Dyspnea, unspecified: Secondary | ICD-10-CM | POA: Diagnosis not present

## 2022-04-23 DIAGNOSIS — Z95811 Presence of heart assist device: Secondary | ICD-10-CM | POA: Diagnosis not present

## 2022-04-23 DIAGNOSIS — Z8719 Personal history of other diseases of the digestive system: Secondary | ICD-10-CM | POA: Diagnosis not present

## 2022-04-24 DIAGNOSIS — I701 Atherosclerosis of renal artery: Secondary | ICD-10-CM | POA: Diagnosis not present

## 2022-04-24 DIAGNOSIS — J810 Acute pulmonary edema: Secondary | ICD-10-CM | POA: Diagnosis not present

## 2022-04-24 DIAGNOSIS — Z95811 Presence of heart assist device: Secondary | ICD-10-CM | POA: Diagnosis not present

## 2022-04-24 DIAGNOSIS — T827XXA Infection and inflammatory reaction due to other cardiac and vascular devices, implants and grafts, initial encounter: Secondary | ICD-10-CM | POA: Diagnosis not present

## 2022-04-24 DIAGNOSIS — R0600 Dyspnea, unspecified: Secondary | ICD-10-CM | POA: Diagnosis not present

## 2022-04-25 DIAGNOSIS — R0600 Dyspnea, unspecified: Secondary | ICD-10-CM | POA: Diagnosis not present

## 2022-04-26 DIAGNOSIS — R0600 Dyspnea, unspecified: Secondary | ICD-10-CM | POA: Diagnosis not present

## 2022-04-26 DIAGNOSIS — J811 Chronic pulmonary edema: Secondary | ICD-10-CM | POA: Diagnosis not present

## 2022-04-27 DIAGNOSIS — R0600 Dyspnea, unspecified: Secondary | ICD-10-CM | POA: Diagnosis not present

## 2022-04-28 DIAGNOSIS — R0600 Dyspnea, unspecified: Secondary | ICD-10-CM | POA: Diagnosis not present

## 2022-04-29 DIAGNOSIS — R0600 Dyspnea, unspecified: Secondary | ICD-10-CM | POA: Diagnosis not present

## 2022-04-30 DIAGNOSIS — I5021 Acute systolic (congestive) heart failure: Secondary | ICD-10-CM | POA: Diagnosis not present

## 2022-05-02 DIAGNOSIS — I5021 Acute systolic (congestive) heart failure: Secondary | ICD-10-CM | POA: Diagnosis not present

## 2022-05-05 DIAGNOSIS — I5021 Acute systolic (congestive) heart failure: Secondary | ICD-10-CM | POA: Diagnosis not present

## 2022-05-05 DIAGNOSIS — T827XXA Infection and inflammatory reaction due to other cardiac and vascular devices, implants and grafts, initial encounter: Secondary | ICD-10-CM | POA: Diagnosis not present

## 2022-05-06 DIAGNOSIS — T827XXA Infection and inflammatory reaction due to other cardiac and vascular devices, implants and grafts, initial encounter: Secondary | ICD-10-CM | POA: Diagnosis not present

## 2022-05-10 ENCOUNTER — Telehealth: Payer: Self-pay | Admitting: Internal Medicine

## 2022-05-10 NOTE — Telephone Encounter (Signed)
Copied from [place] [facility] [id number]. Topic: Medicare AWV >> May 10, 2022  9:59 AM Devoria Glassing wrote: Reason for CRM: Left message for patient to schedule Annual Wellness Visit.  Please schedule with Nurse Health Advisor Denisa O'Brien-Blaney, LPN at [facility].  Please call [phone] ask for [facility]

## 2022-05-11 DIAGNOSIS — I5021 Acute systolic (congestive) heart failure: Secondary | ICD-10-CM | POA: Diagnosis not present

## 2022-05-11 DIAGNOSIS — T827XXA Infection and inflammatory reaction due to other cardiac and vascular devices, implants and grafts, initial encounter: Secondary | ICD-10-CM | POA: Diagnosis not present

## 2022-05-13 DIAGNOSIS — T827XXA Infection and inflammatory reaction due to other cardiac and vascular devices, implants and grafts, initial encounter: Secondary | ICD-10-CM | POA: Diagnosis not present

## 2022-05-15 DIAGNOSIS — I480 Paroxysmal atrial fibrillation: Secondary | ICD-10-CM | POA: Diagnosis not present

## 2022-05-15 DIAGNOSIS — Z87891 Personal history of nicotine dependence: Secondary | ICD-10-CM | POA: Diagnosis not present

## 2022-05-15 DIAGNOSIS — Z4801 Encounter for change or removal of surgical wound dressing: Secondary | ICD-10-CM | POA: Diagnosis not present

## 2022-05-15 DIAGNOSIS — T827XXD Infection and inflammatory reaction due to other cardiac and vascular devices, implants and grafts, subsequent encounter: Secondary | ICD-10-CM | POA: Diagnosis not present

## 2022-05-15 DIAGNOSIS — T827XXA Infection and inflammatory reaction due to other cardiac and vascular devices, implants and grafts, initial encounter: Secondary | ICD-10-CM | POA: Diagnosis not present

## 2022-05-15 DIAGNOSIS — T829XXA Unspecified complication of cardiac and vascular prosthetic device, implant and graft, initial encounter: Secondary | ICD-10-CM | POA: Diagnosis not present

## 2022-05-15 DIAGNOSIS — I428 Other cardiomyopathies: Secondary | ICD-10-CM | POA: Diagnosis not present

## 2022-05-15 DIAGNOSIS — R195 Other fecal abnormalities: Secondary | ICD-10-CM | POA: Diagnosis not present

## 2022-05-15 DIAGNOSIS — Z48812 Encounter for surgical aftercare following surgery on the circulatory system: Secondary | ICD-10-CM | POA: Diagnosis not present

## 2022-05-15 DIAGNOSIS — Z8719 Personal history of other diseases of the digestive system: Secondary | ICD-10-CM | POA: Diagnosis not present

## 2022-05-15 DIAGNOSIS — Z86718 Personal history of other venous thrombosis and embolism: Secondary | ICD-10-CM | POA: Diagnosis not present

## 2022-05-15 DIAGNOSIS — Z952 Presence of prosthetic heart valve: Secondary | ICD-10-CM | POA: Diagnosis not present

## 2022-05-15 DIAGNOSIS — Z95811 Presence of heart assist device: Secondary | ICD-10-CM | POA: Diagnosis not present

## 2022-05-15 DIAGNOSIS — Z8673 Personal history of transient ischemic attack (TIA), and cerebral infarction without residual deficits: Secondary | ICD-10-CM | POA: Diagnosis not present

## 2022-05-20 DIAGNOSIS — T827XXA Infection and inflammatory reaction due to other cardiac and vascular devices, implants and grafts, initial encounter: Secondary | ICD-10-CM | POA: Diagnosis not present

## 2022-05-22 DIAGNOSIS — Z7901 Long term (current) use of anticoagulants: Secondary | ICD-10-CM | POA: Diagnosis not present

## 2022-05-22 DIAGNOSIS — Z952 Presence of prosthetic heart valve: Secondary | ICD-10-CM | POA: Diagnosis not present

## 2022-05-23 DIAGNOSIS — R0689 Other abnormalities of breathing: Secondary | ICD-10-CM | POA: Diagnosis not present

## 2022-05-23 DIAGNOSIS — N179 Acute kidney failure, unspecified: Secondary | ICD-10-CM | POA: Diagnosis not present

## 2022-05-23 DIAGNOSIS — Z8673 Personal history of transient ischemic attack (TIA), and cerebral infarction without residual deficits: Secondary | ICD-10-CM | POA: Diagnosis not present

## 2022-05-23 DIAGNOSIS — I480 Paroxysmal atrial fibrillation: Secondary | ICD-10-CM | POA: Diagnosis not present

## 2022-05-23 DIAGNOSIS — I4891 Unspecified atrial fibrillation: Secondary | ICD-10-CM | POA: Diagnosis not present

## 2022-05-23 DIAGNOSIS — E871 Hypo-osmolality and hyponatremia: Secondary | ICD-10-CM | POA: Diagnosis not present

## 2022-05-23 DIAGNOSIS — I255 Ischemic cardiomyopathy: Secondary | ICD-10-CM | POA: Diagnosis not present

## 2022-05-23 DIAGNOSIS — J810 Acute pulmonary edema: Secondary | ICD-10-CM | POA: Diagnosis not present

## 2022-05-23 DIAGNOSIS — J069 Acute upper respiratory infection, unspecified: Secondary | ICD-10-CM | POA: Diagnosis not present

## 2022-05-23 DIAGNOSIS — S22000A Wedge compression fracture of unspecified thoracic vertebra, initial encounter for closed fracture: Secondary | ICD-10-CM | POA: Diagnosis not present

## 2022-05-23 DIAGNOSIS — I517 Cardiomegaly: Secondary | ICD-10-CM | POA: Diagnosis not present

## 2022-05-23 DIAGNOSIS — J449 Chronic obstructive pulmonary disease, unspecified: Secondary | ICD-10-CM | POA: Diagnosis not present

## 2022-05-23 DIAGNOSIS — E785 Hyperlipidemia, unspecified: Secondary | ICD-10-CM | POA: Diagnosis not present

## 2022-05-23 DIAGNOSIS — Z743 Need for continuous supervision: Secondary | ICD-10-CM | POA: Diagnosis not present

## 2022-05-23 DIAGNOSIS — R197 Diarrhea, unspecified: Secondary | ICD-10-CM | POA: Diagnosis not present

## 2022-05-23 DIAGNOSIS — I701 Atherosclerosis of renal artery: Secondary | ICD-10-CM | POA: Diagnosis not present

## 2022-05-23 DIAGNOSIS — R600 Localized edema: Secondary | ICD-10-CM | POA: Diagnosis not present

## 2022-05-23 DIAGNOSIS — R918 Other nonspecific abnormal finding of lung field: Secondary | ICD-10-CM | POA: Diagnosis not present

## 2022-05-23 DIAGNOSIS — M4854XA Collapsed vertebra, not elsewhere classified, thoracic region, initial encounter for fracture: Secondary | ICD-10-CM | POA: Diagnosis not present

## 2022-05-23 DIAGNOSIS — E119 Type 2 diabetes mellitus without complications: Secondary | ICD-10-CM | POA: Diagnosis not present

## 2022-05-23 DIAGNOSIS — T827XXA Infection and inflammatory reaction due to other cardiac and vascular devices, implants and grafts, initial encounter: Secondary | ICD-10-CM | POA: Diagnosis not present

## 2022-05-23 DIAGNOSIS — I340 Nonrheumatic mitral (valve) insufficiency: Secondary | ICD-10-CM | POA: Diagnosis not present

## 2022-05-23 DIAGNOSIS — I272 Pulmonary hypertension, unspecified: Secondary | ICD-10-CM | POA: Diagnosis not present

## 2022-05-23 DIAGNOSIS — J90 Pleural effusion, not elsewhere classified: Secondary | ICD-10-CM | POA: Diagnosis not present

## 2022-05-23 DIAGNOSIS — E1151 Type 2 diabetes mellitus with diabetic peripheral angiopathy without gangrene: Secondary | ICD-10-CM | POA: Diagnosis not present

## 2022-05-23 DIAGNOSIS — D72829 Elevated white blood cell count, unspecified: Secondary | ICD-10-CM | POA: Diagnosis not present

## 2022-05-23 DIAGNOSIS — M4856XA Collapsed vertebra, not elsewhere classified, lumbar region, initial encounter for fracture: Secondary | ICD-10-CM | POA: Diagnosis not present

## 2022-05-23 DIAGNOSIS — I5022 Chronic systolic (congestive) heart failure: Secondary | ICD-10-CM | POA: Diagnosis not present

## 2022-05-23 DIAGNOSIS — N183 Chronic kidney disease, stage 3 unspecified: Secondary | ICD-10-CM | POA: Diagnosis not present

## 2022-05-23 DIAGNOSIS — E039 Hypothyroidism, unspecified: Secondary | ICD-10-CM | POA: Diagnosis not present

## 2022-05-23 DIAGNOSIS — R0902 Hypoxemia: Secondary | ICD-10-CM | POA: Diagnosis not present

## 2022-05-23 DIAGNOSIS — I110 Hypertensive heart disease with heart failure: Secondary | ICD-10-CM | POA: Diagnosis not present

## 2022-05-23 DIAGNOSIS — R7989 Other specified abnormal findings of blood chemistry: Secondary | ICD-10-CM | POA: Diagnosis not present

## 2022-05-23 DIAGNOSIS — Z951 Presence of aortocoronary bypass graft: Secondary | ICD-10-CM | POA: Diagnosis not present

## 2022-05-23 DIAGNOSIS — R0600 Dyspnea, unspecified: Secondary | ICD-10-CM | POA: Diagnosis not present

## 2022-05-23 DIAGNOSIS — E875 Hyperkalemia: Secondary | ICD-10-CM | POA: Diagnosis not present

## 2022-05-23 DIAGNOSIS — Z7901 Long term (current) use of anticoagulants: Secondary | ICD-10-CM | POA: Diagnosis not present

## 2022-05-23 DIAGNOSIS — D509 Iron deficiency anemia, unspecified: Secondary | ICD-10-CM | POA: Diagnosis not present

## 2022-05-23 DIAGNOSIS — I420 Dilated cardiomyopathy: Secondary | ICD-10-CM | POA: Diagnosis not present

## 2022-05-23 DIAGNOSIS — E1140 Type 2 diabetes mellitus with diabetic neuropathy, unspecified: Secondary | ICD-10-CM | POA: Diagnosis not present

## 2022-05-23 DIAGNOSIS — Z953 Presence of xenogenic heart valve: Secondary | ICD-10-CM | POA: Diagnosis not present

## 2022-05-23 DIAGNOSIS — I150 Renovascular hypertension: Secondary | ICD-10-CM | POA: Diagnosis not present

## 2022-05-23 DIAGNOSIS — I5043 Acute on chronic combined systolic (congestive) and diastolic (congestive) heart failure: Secondary | ICD-10-CM | POA: Diagnosis not present

## 2022-05-23 DIAGNOSIS — I2510 Atherosclerotic heart disease of native coronary artery without angina pectoris: Secondary | ICD-10-CM | POA: Diagnosis not present

## 2022-05-23 DIAGNOSIS — M545 Low back pain, unspecified: Secondary | ICD-10-CM | POA: Diagnosis not present

## 2022-05-23 DIAGNOSIS — E559 Vitamin D deficiency, unspecified: Secondary | ICD-10-CM | POA: Diagnosis not present

## 2022-05-23 DIAGNOSIS — E1122 Type 2 diabetes mellitus with diabetic chronic kidney disease: Secondary | ICD-10-CM | POA: Diagnosis not present

## 2022-05-23 DIAGNOSIS — J9601 Acute respiratory failure with hypoxia: Secondary | ICD-10-CM | POA: Diagnosis not present

## 2022-05-23 DIAGNOSIS — I130 Hypertensive heart and chronic kidney disease with heart failure and stage 1 through stage 4 chronic kidney disease, or unspecified chronic kidney disease: Secondary | ICD-10-CM | POA: Diagnosis not present

## 2022-05-23 DIAGNOSIS — Z95811 Presence of heart assist device: Secondary | ICD-10-CM | POA: Diagnosis not present

## 2022-05-24 DIAGNOSIS — Z95811 Presence of heart assist device: Secondary | ICD-10-CM | POA: Diagnosis not present

## 2022-05-24 DIAGNOSIS — J9601 Acute respiratory failure with hypoxia: Secondary | ICD-10-CM | POA: Diagnosis not present

## 2022-05-24 DIAGNOSIS — I5022 Chronic systolic (congestive) heart failure: Secondary | ICD-10-CM | POA: Diagnosis not present

## 2022-05-24 DIAGNOSIS — M4854XA Collapsed vertebra, not elsewhere classified, thoracic region, initial encounter for fracture: Secondary | ICD-10-CM | POA: Diagnosis not present

## 2022-05-24 DIAGNOSIS — M4856XA Collapsed vertebra, not elsewhere classified, lumbar region, initial encounter for fracture: Secondary | ICD-10-CM | POA: Diagnosis not present

## 2022-05-24 DIAGNOSIS — M545 Low back pain, unspecified: Secondary | ICD-10-CM | POA: Diagnosis not present

## 2022-05-24 DIAGNOSIS — J810 Acute pulmonary edema: Secondary | ICD-10-CM | POA: Diagnosis not present

## 2022-05-24 DIAGNOSIS — I255 Ischemic cardiomyopathy: Secondary | ICD-10-CM | POA: Diagnosis not present

## 2022-05-25 DIAGNOSIS — M545 Low back pain, unspecified: Secondary | ICD-10-CM | POA: Diagnosis not present

## 2022-05-25 DIAGNOSIS — E559 Vitamin D deficiency, unspecified: Secondary | ICD-10-CM | POA: Diagnosis not present

## 2022-05-25 DIAGNOSIS — I150 Renovascular hypertension: Secondary | ICD-10-CM | POA: Diagnosis not present

## 2022-05-25 DIAGNOSIS — M4856XA Collapsed vertebra, not elsewhere classified, lumbar region, initial encounter for fracture: Secondary | ICD-10-CM | POA: Diagnosis not present

## 2022-05-25 DIAGNOSIS — Z95811 Presence of heart assist device: Secondary | ICD-10-CM | POA: Diagnosis not present

## 2022-05-25 DIAGNOSIS — J9601 Acute respiratory failure with hypoxia: Secondary | ICD-10-CM | POA: Diagnosis not present

## 2022-05-25 DIAGNOSIS — Z7901 Long term (current) use of anticoagulants: Secondary | ICD-10-CM | POA: Diagnosis not present

## 2022-05-25 DIAGNOSIS — J810 Acute pulmonary edema: Secondary | ICD-10-CM | POA: Diagnosis not present

## 2022-05-25 DIAGNOSIS — I5022 Chronic systolic (congestive) heart failure: Secondary | ICD-10-CM | POA: Diagnosis not present

## 2022-05-25 DIAGNOSIS — I255 Ischemic cardiomyopathy: Secondary | ICD-10-CM | POA: Diagnosis not present

## 2022-05-25 DIAGNOSIS — S22000A Wedge compression fracture of unspecified thoracic vertebra, initial encounter for closed fracture: Secondary | ICD-10-CM | POA: Diagnosis not present

## 2022-05-26 DIAGNOSIS — Z95811 Presence of heart assist device: Secondary | ICD-10-CM | POA: Diagnosis not present

## 2022-05-26 DIAGNOSIS — J810 Acute pulmonary edema: Secondary | ICD-10-CM | POA: Diagnosis not present

## 2022-05-26 DIAGNOSIS — J9601 Acute respiratory failure with hypoxia: Secondary | ICD-10-CM | POA: Diagnosis not present

## 2022-05-26 DIAGNOSIS — I255 Ischemic cardiomyopathy: Secondary | ICD-10-CM | POA: Diagnosis not present

## 2022-05-26 DIAGNOSIS — I5022 Chronic systolic (congestive) heart failure: Secondary | ICD-10-CM | POA: Diagnosis not present

## 2022-05-26 DIAGNOSIS — M545 Low back pain, unspecified: Secondary | ICD-10-CM | POA: Diagnosis not present

## 2022-05-26 DIAGNOSIS — T827XXA Infection and inflammatory reaction due to other cardiac and vascular devices, implants and grafts, initial encounter: Secondary | ICD-10-CM | POA: Diagnosis not present

## 2022-05-27 DIAGNOSIS — I255 Ischemic cardiomyopathy: Secondary | ICD-10-CM | POA: Diagnosis not present

## 2022-05-27 DIAGNOSIS — M545 Low back pain, unspecified: Secondary | ICD-10-CM | POA: Diagnosis not present

## 2022-05-27 DIAGNOSIS — J90 Pleural effusion, not elsewhere classified: Secondary | ICD-10-CM | POA: Diagnosis not present

## 2022-05-27 DIAGNOSIS — Z95811 Presence of heart assist device: Secondary | ICD-10-CM | POA: Diagnosis not present

## 2022-05-28 DIAGNOSIS — I255 Ischemic cardiomyopathy: Secondary | ICD-10-CM | POA: Diagnosis not present

## 2022-05-28 DIAGNOSIS — M545 Low back pain, unspecified: Secondary | ICD-10-CM | POA: Diagnosis not present

## 2022-05-28 DIAGNOSIS — Z95811 Presence of heart assist device: Secondary | ICD-10-CM | POA: Diagnosis not present

## 2022-05-29 DIAGNOSIS — M545 Low back pain, unspecified: Secondary | ICD-10-CM | POA: Diagnosis not present

## 2022-05-29 DIAGNOSIS — Z95811 Presence of heart assist device: Secondary | ICD-10-CM | POA: Diagnosis not present

## 2022-05-29 DIAGNOSIS — R0600 Dyspnea, unspecified: Secondary | ICD-10-CM | POA: Diagnosis not present

## 2022-05-29 DIAGNOSIS — T827XXA Infection and inflammatory reaction due to other cardiac and vascular devices, implants and grafts, initial encounter: Secondary | ICD-10-CM | POA: Diagnosis not present

## 2022-05-29 DIAGNOSIS — I255 Ischemic cardiomyopathy: Secondary | ICD-10-CM | POA: Diagnosis not present

## 2022-05-30 DIAGNOSIS — T827XXA Infection and inflammatory reaction due to other cardiac and vascular devices, implants and grafts, initial encounter: Secondary | ICD-10-CM | POA: Diagnosis not present

## 2022-05-30 DIAGNOSIS — I255 Ischemic cardiomyopathy: Secondary | ICD-10-CM | POA: Diagnosis not present

## 2022-05-30 DIAGNOSIS — M545 Low back pain, unspecified: Secondary | ICD-10-CM | POA: Diagnosis not present

## 2022-05-30 DIAGNOSIS — Z95811 Presence of heart assist device: Secondary | ICD-10-CM | POA: Diagnosis not present

## 2022-05-31 DIAGNOSIS — I255 Ischemic cardiomyopathy: Secondary | ICD-10-CM | POA: Diagnosis not present

## 2022-05-31 DIAGNOSIS — I4891 Unspecified atrial fibrillation: Secondary | ICD-10-CM | POA: Diagnosis not present

## 2022-05-31 DIAGNOSIS — Z95811 Presence of heart assist device: Secondary | ICD-10-CM | POA: Diagnosis not present

## 2022-05-31 DIAGNOSIS — Z8673 Personal history of transient ischemic attack (TIA), and cerebral infarction without residual deficits: Secondary | ICD-10-CM | POA: Diagnosis not present

## 2022-05-31 DIAGNOSIS — Z951 Presence of aortocoronary bypass graft: Secondary | ICD-10-CM | POA: Diagnosis not present

## 2022-05-31 DIAGNOSIS — I340 Nonrheumatic mitral (valve) insufficiency: Secondary | ICD-10-CM | POA: Diagnosis not present

## 2022-05-31 DIAGNOSIS — M545 Low back pain, unspecified: Secondary | ICD-10-CM | POA: Diagnosis not present

## 2022-05-31 DIAGNOSIS — I5043 Acute on chronic combined systolic (congestive) and diastolic (congestive) heart failure: Secondary | ICD-10-CM | POA: Diagnosis not present

## 2022-05-31 DIAGNOSIS — E119 Type 2 diabetes mellitus without complications: Secondary | ICD-10-CM | POA: Diagnosis not present

## 2022-05-31 DIAGNOSIS — J449 Chronic obstructive pulmonary disease, unspecified: Secondary | ICD-10-CM | POA: Diagnosis not present

## 2022-05-31 DIAGNOSIS — T827XXA Infection and inflammatory reaction due to other cardiac and vascular devices, implants and grafts, initial encounter: Secondary | ICD-10-CM | POA: Diagnosis not present

## 2022-06-01 DIAGNOSIS — Z95811 Presence of heart assist device: Secondary | ICD-10-CM | POA: Diagnosis not present

## 2022-06-01 DIAGNOSIS — M545 Low back pain, unspecified: Secondary | ICD-10-CM | POA: Diagnosis not present

## 2022-06-01 DIAGNOSIS — I255 Ischemic cardiomyopathy: Secondary | ICD-10-CM | POA: Diagnosis not present

## 2022-06-06 ENCOUNTER — Encounter: Payer: Self-pay | Admitting: Internal Medicine

## 2022-06-07 ENCOUNTER — Encounter: Payer: Medicare Other | Admitting: Internal Medicine

## 2022-06-08 DIAGNOSIS — I5022 Chronic systolic (congestive) heart failure: Secondary | ICD-10-CM | POA: Diagnosis not present

## 2022-06-09 ENCOUNTER — Telehealth: Payer: Self-pay | Admitting: *Deleted

## 2022-06-09 NOTE — Chronic Care Management (AMB) (Signed)
Opened in error

## 2022-06-13 ENCOUNTER — Telehealth: Payer: Self-pay

## 2022-06-13 NOTE — Patient Outreach (Signed)
  Care Coordination   06/13/2022 Name: Ashley [name] MRN: [id number] DOB: [birth year]   Care Coordination Outreach Attempts:  An unsuccessful telephone outreach was attempted today to offer the patient information about available care coordination services as a benefit of their health plan. Attempted call x 3 to patient.  Message states call cannot be completed as dialed.   Follow Up Plan:  Additional outreach attempts will be made to offer the patient care coordination information and services.   Encounter Outcome:  No Answer  Care Coordination Interventions Activated:  No   Care Coordination Interventions:  No, not indicated   .Quinn Plowman RN,BSN,CCM RN Care Manager Coordinator [place]  [phone]

## 2022-06-21 ENCOUNTER — Telehealth: Payer: Self-pay | Admitting: *Deleted

## 2022-06-21 DIAGNOSIS — Z87891 Personal history of nicotine dependence: Secondary | ICD-10-CM | POA: Diagnosis not present

## 2022-06-21 DIAGNOSIS — Z4502 Encounter for adjustment and management of automatic implantable cardiac defibrillator: Secondary | ICD-10-CM | POA: Diagnosis not present

## 2022-06-21 DIAGNOSIS — I5023 Acute on chronic systolic (congestive) heart failure: Secondary | ICD-10-CM | POA: Diagnosis not present

## 2022-06-21 DIAGNOSIS — E039 Hypothyroidism, unspecified: Secondary | ICD-10-CM | POA: Diagnosis not present

## 2022-06-21 DIAGNOSIS — K922 Gastrointestinal hemorrhage, unspecified: Secondary | ICD-10-CM | POA: Diagnosis not present

## 2022-06-21 DIAGNOSIS — E785 Hyperlipidemia, unspecified: Secondary | ICD-10-CM | POA: Diagnosis not present

## 2022-06-21 DIAGNOSIS — G8929 Other chronic pain: Secondary | ICD-10-CM | POA: Diagnosis not present

## 2022-06-21 DIAGNOSIS — K219 Gastro-esophageal reflux disease without esophagitis: Secondary | ICD-10-CM | POA: Diagnosis not present

## 2022-06-21 DIAGNOSIS — R9431 Abnormal electrocardiogram [ECG] [EKG]: Secondary | ICD-10-CM | POA: Diagnosis not present

## 2022-06-21 DIAGNOSIS — Z9049 Acquired absence of other specified parts of digestive tract: Secondary | ICD-10-CM | POA: Diagnosis not present

## 2022-06-21 DIAGNOSIS — I130 Hypertensive heart and chronic kidney disease with heart failure and stage 1 through stage 4 chronic kidney disease, or unspecified chronic kidney disease: Secondary | ICD-10-CM | POA: Diagnosis not present

## 2022-06-21 DIAGNOSIS — I480 Paroxysmal atrial fibrillation: Secondary | ICD-10-CM | POA: Diagnosis not present

## 2022-06-21 DIAGNOSIS — N183 Chronic kidney disease, stage 3 unspecified: Secondary | ICD-10-CM | POA: Diagnosis not present

## 2022-06-21 DIAGNOSIS — J449 Chronic obstructive pulmonary disease, unspecified: Secondary | ICD-10-CM | POA: Diagnosis not present

## 2022-06-21 DIAGNOSIS — I272 Pulmonary hypertension, unspecified: Secondary | ICD-10-CM | POA: Diagnosis not present

## 2022-06-21 DIAGNOSIS — E1122 Type 2 diabetes mellitus with diabetic chronic kidney disease: Secondary | ICD-10-CM | POA: Diagnosis not present

## 2022-06-21 DIAGNOSIS — Z95810 Presence of automatic (implantable) cardiac defibrillator: Secondary | ICD-10-CM | POA: Diagnosis not present

## 2022-06-21 DIAGNOSIS — Z8673 Personal history of transient ischemic attack (TIA), and cerebral infarction without residual deficits: Secondary | ICD-10-CM | POA: Diagnosis not present

## 2022-06-21 DIAGNOSIS — I2510 Atherosclerotic heart disease of native coronary artery without angina pectoris: Secondary | ICD-10-CM | POA: Diagnosis not present

## 2022-06-21 DIAGNOSIS — I447 Left bundle-branch block, unspecified: Secondary | ICD-10-CM | POA: Diagnosis not present

## 2022-06-21 DIAGNOSIS — I701 Atherosclerosis of renal artery: Secondary | ICD-10-CM | POA: Diagnosis not present

## 2022-06-21 DIAGNOSIS — K30 Functional dyspepsia: Secondary | ICD-10-CM | POA: Diagnosis not present

## 2022-06-21 DIAGNOSIS — Z955 Presence of coronary angioplasty implant and graft: Secondary | ICD-10-CM | POA: Diagnosis not present

## 2022-06-21 DIAGNOSIS — I255 Ischemic cardiomyopathy: Secondary | ICD-10-CM | POA: Diagnosis not present

## 2022-06-21 DIAGNOSIS — E1151 Type 2 diabetes mellitus with diabetic peripheral angiopathy without gangrene: Secondary | ICD-10-CM | POA: Diagnosis not present

## 2022-06-21 DIAGNOSIS — Z7901 Long term (current) use of anticoagulants: Secondary | ICD-10-CM | POA: Diagnosis not present

## 2022-06-21 DIAGNOSIS — Z85038 Personal history of other malignant neoplasm of large intestine: Secondary | ICD-10-CM | POA: Diagnosis not present

## 2022-06-21 DIAGNOSIS — I252 Old myocardial infarction: Secondary | ICD-10-CM | POA: Diagnosis not present

## 2022-06-21 DIAGNOSIS — R0601 Orthopnea: Secondary | ICD-10-CM | POA: Diagnosis not present

## 2022-06-21 NOTE — Chronic Care Management (AMB) (Signed)
  Care Coordination  Outreach Note  06/21/2022 Name: Ashley [name] MRN: [id number] DOB: 1951/03/27   Care Coordination Outreach Attempts  A third unsuccessful outreach was attempted today to offer the patient with information about available care coordination services as a benefit of their health plan.   Follow Up Plan:  No further outreach attempts will be made at this time. We have been unable to contact the patient to offer or enroll patient in care coordination services  Encounter Outcome:  No Answer  Julian Hy, [place] Direct Dial: [phone]

## 2022-06-22 DIAGNOSIS — E1122 Type 2 diabetes mellitus with diabetic chronic kidney disease: Secondary | ICD-10-CM | POA: Diagnosis not present

## 2022-06-22 DIAGNOSIS — I480 Paroxysmal atrial fibrillation: Secondary | ICD-10-CM | POA: Diagnosis not present

## 2022-06-22 DIAGNOSIS — E785 Hyperlipidemia, unspecified: Secondary | ICD-10-CM | POA: Diagnosis not present

## 2022-06-22 DIAGNOSIS — I5023 Acute on chronic systolic (congestive) heart failure: Secondary | ICD-10-CM | POA: Diagnosis not present

## 2022-06-22 DIAGNOSIS — K30 Functional dyspepsia: Secondary | ICD-10-CM | POA: Diagnosis not present

## 2022-06-22 DIAGNOSIS — I272 Pulmonary hypertension, unspecified: Secondary | ICD-10-CM | POA: Diagnosis not present

## 2022-06-22 DIAGNOSIS — I252 Old myocardial infarction: Secondary | ICD-10-CM | POA: Diagnosis not present

## 2022-06-22 DIAGNOSIS — R9431 Abnormal electrocardiogram [ECG] [EKG]: Secondary | ICD-10-CM | POA: Diagnosis not present

## 2022-06-22 DIAGNOSIS — I2510 Atherosclerotic heart disease of native coronary artery without angina pectoris: Secondary | ICD-10-CM | POA: Diagnosis not present

## 2022-06-22 DIAGNOSIS — Z87891 Personal history of nicotine dependence: Secondary | ICD-10-CM | POA: Diagnosis not present

## 2022-06-22 DIAGNOSIS — K219 Gastro-esophageal reflux disease without esophagitis: Secondary | ICD-10-CM | POA: Diagnosis not present

## 2022-06-22 DIAGNOSIS — I130 Hypertensive heart and chronic kidney disease with heart failure and stage 1 through stage 4 chronic kidney disease, or unspecified chronic kidney disease: Secondary | ICD-10-CM | POA: Diagnosis not present

## 2022-06-22 DIAGNOSIS — E039 Hypothyroidism, unspecified: Secondary | ICD-10-CM | POA: Diagnosis not present

## 2022-06-22 DIAGNOSIS — Z85038 Personal history of other malignant neoplasm of large intestine: Secondary | ICD-10-CM | POA: Diagnosis not present

## 2022-06-22 DIAGNOSIS — J449 Chronic obstructive pulmonary disease, unspecified: Secondary | ICD-10-CM | POA: Diagnosis not present

## 2022-06-22 DIAGNOSIS — G8929 Other chronic pain: Secondary | ICD-10-CM | POA: Diagnosis not present

## 2022-06-22 DIAGNOSIS — E1151 Type 2 diabetes mellitus with diabetic peripheral angiopathy without gangrene: Secondary | ICD-10-CM | POA: Diagnosis not present

## 2022-06-22 DIAGNOSIS — I447 Left bundle-branch block, unspecified: Secondary | ICD-10-CM | POA: Diagnosis not present

## 2022-06-22 DIAGNOSIS — Z8673 Personal history of transient ischemic attack (TIA), and cerebral infarction without residual deficits: Secondary | ICD-10-CM | POA: Diagnosis not present

## 2022-06-22 DIAGNOSIS — N183 Chronic kidney disease, stage 3 unspecified: Secondary | ICD-10-CM | POA: Diagnosis not present

## 2022-06-22 DIAGNOSIS — Z4502 Encounter for adjustment and management of automatic implantable cardiac defibrillator: Secondary | ICD-10-CM | POA: Diagnosis not present

## 2022-06-22 DIAGNOSIS — I701 Atherosclerosis of renal artery: Secondary | ICD-10-CM | POA: Diagnosis not present

## 2022-06-22 DIAGNOSIS — Z7901 Long term (current) use of anticoagulants: Secondary | ICD-10-CM | POA: Diagnosis not present

## 2022-06-22 DIAGNOSIS — I255 Ischemic cardiomyopathy: Secondary | ICD-10-CM | POA: Diagnosis not present

## 2022-06-26 ENCOUNTER — Telehealth: Payer: Self-pay

## 2022-06-26 NOTE — Telephone Encounter (Signed)
FYI

## 2022-06-26 NOTE — Telephone Encounter (Signed)
Arbie Cookey called from [facility] to state she will mail patient information to [place] since the volume would be too much to fax.

## 2022-07-11 ENCOUNTER — Telehealth: Payer: Self-pay | Admitting: Internal Medicine

## 2022-07-11 DIAGNOSIS — Z7901 Long term (current) use of anticoagulants: Secondary | ICD-10-CM | POA: Diagnosis not present

## 2022-07-11 DIAGNOSIS — Z952 Presence of prosthetic heart valve: Secondary | ICD-10-CM | POA: Diagnosis not present

## 2022-07-11 NOTE — Telephone Encounter (Signed)
Copied from [place] [phone]. Topic: Medicare AWV >> Jul 11, 2022  3:15 PM Devoria Glassing wrote: Reason for CRM: Left message for patient to schedule Annual Wellness Visit.  Please schedule with Nurse Health Advisor Denisa O'Brien-Blaney, LPN at [facility]. This appt can be telephone or office visit.  Please call [phone] ask for [facility]

## 2022-07-17 ENCOUNTER — Telehealth: Payer: Self-pay | Admitting: *Deleted

## 2022-07-17 NOTE — Patient Outreach (Signed)
  Care Coordination   07/17/2022 Name: Ashley [name] MRN: [id number] DOB: Sep 07, 1951   Care Coordination Outreach Attempts:  A second unsuccessful outreach was attempted today to offer the patient with information about available care coordination services as a benefit of their health plan.     Follow Up Plan:  Additional outreach attempts will be made to offer the patient care coordination information and services.   Encounter Outcome:  No Answer  invalid number Care Coordination Interventions Activated:  Yes   Care Coordination Interventions:  No, not indicated    [place] [facility] [phone]

## 2022-07-18 DIAGNOSIS — Z952 Presence of prosthetic heart valve: Secondary | ICD-10-CM | POA: Diagnosis not present

## 2022-07-18 DIAGNOSIS — Z7901 Long term (current) use of anticoagulants: Secondary | ICD-10-CM | POA: Diagnosis not present

## 2022-07-18 DIAGNOSIS — I10 Essential (primary) hypertension: Secondary | ICD-10-CM | POA: Diagnosis not present

## 2022-07-18 DIAGNOSIS — Z4509 Encounter for adjustment and management of other cardiac device: Secondary | ICD-10-CM | POA: Diagnosis not present

## 2022-07-18 DIAGNOSIS — R9431 Abnormal electrocardiogram [ECG] [EKG]: Secondary | ICD-10-CM | POA: Diagnosis not present

## 2022-07-18 DIAGNOSIS — E612 Magnesium deficiency: Secondary | ICD-10-CM | POA: Diagnosis not present

## 2022-07-18 DIAGNOSIS — I4891 Unspecified atrial fibrillation: Secondary | ICD-10-CM | POA: Diagnosis not present

## 2022-07-18 DIAGNOSIS — I480 Paroxysmal atrial fibrillation: Secondary | ICD-10-CM | POA: Diagnosis not present

## 2022-07-18 DIAGNOSIS — N183 Chronic kidney disease, stage 3 unspecified: Secondary | ICD-10-CM | POA: Diagnosis not present

## 2022-07-18 DIAGNOSIS — I255 Ischemic cardiomyopathy: Secondary | ICD-10-CM | POA: Diagnosis not present

## 2022-07-18 DIAGNOSIS — M549 Dorsalgia, unspecified: Secondary | ICD-10-CM | POA: Diagnosis not present

## 2022-07-18 DIAGNOSIS — Z79899 Other long term (current) drug therapy: Secondary | ICD-10-CM | POA: Diagnosis not present

## 2022-07-18 DIAGNOSIS — I447 Left bundle-branch block, unspecified: Secondary | ICD-10-CM | POA: Diagnosis not present

## 2022-07-18 DIAGNOSIS — Z95811 Presence of heart assist device: Secondary | ICD-10-CM | POA: Diagnosis not present

## 2022-07-18 DIAGNOSIS — I5022 Chronic systolic (congestive) heart failure: Secondary | ICD-10-CM | POA: Diagnosis not present

## 2022-07-18 DIAGNOSIS — G8929 Other chronic pain: Secondary | ICD-10-CM | POA: Diagnosis not present

## 2022-07-18 DIAGNOSIS — I130 Hypertensive heart and chronic kidney disease with heart failure and stage 1 through stage 4 chronic kidney disease, or unspecified chronic kidney disease: Secondary | ICD-10-CM | POA: Diagnosis not present

## 2022-07-18 DIAGNOSIS — T827XXA Infection and inflammatory reaction due to other cardiac and vascular devices, implants and grafts, initial encounter: Secondary | ICD-10-CM | POA: Diagnosis not present

## 2022-07-19 ENCOUNTER — Telehealth: Payer: Self-pay | Admitting: Internal Medicine

## 2022-07-19 NOTE — Telephone Encounter (Signed)
Copied from [place] [phone]. Topic: Medicare AWV >> Jul 19, 2022  1:58 PM Devoria Glassing wrote: Reason for CRM: Call number listed to schedule AWV, phone hangs up.

## 2022-08-31 ENCOUNTER — Telehealth: Payer: Self-pay | Admitting: Internal Medicine

## 2022-08-31 DIAGNOSIS — Z952 Presence of prosthetic heart valve: Secondary | ICD-10-CM | POA: Diagnosis not present

## 2022-08-31 DIAGNOSIS — Z7901 Long term (current) use of anticoagulants: Secondary | ICD-10-CM | POA: Diagnosis not present

## 2022-08-31 NOTE — Telephone Encounter (Signed)
Copied from [place] [phone]. Topic: Medicare AWV >> Aug 31, 2022  9:50 AM Devoria Glassing wrote: Reason for PEA:KLTYVD patient to schedule Annual Wellness Visit.  Please schedule with Nurse Health Advisor Denisa O'Brien-Blaney, LPN at [facility]. This appt can be telephone or office visit.  Please call [phone] ask for [facility]

## 2022-09-25 DIAGNOSIS — Z952 Presence of prosthetic heart valve: Secondary | ICD-10-CM | POA: Diagnosis not present

## 2022-09-25 DIAGNOSIS — Z7901 Long term (current) use of anticoagulants: Secondary | ICD-10-CM | POA: Diagnosis not present

## 2022-10-09 DIAGNOSIS — Z48812 Encounter for surgical aftercare following surgery on the circulatory system: Secondary | ICD-10-CM | POA: Diagnosis not present

## 2022-10-09 DIAGNOSIS — Z4801 Encounter for change or removal of surgical wound dressing: Secondary | ICD-10-CM | POA: Diagnosis not present

## 2022-10-09 DIAGNOSIS — I428 Other cardiomyopathies: Secondary | ICD-10-CM | POA: Diagnosis not present

## 2022-10-09 DIAGNOSIS — Z95811 Presence of heart assist device: Secondary | ICD-10-CM | POA: Diagnosis not present

## 2022-10-12 DIAGNOSIS — I493 Ventricular premature depolarization: Secondary | ICD-10-CM | POA: Diagnosis not present

## 2022-10-12 DIAGNOSIS — T827XXA Infection and inflammatory reaction due to other cardiac and vascular devices, implants and grafts, initial encounter: Secondary | ICD-10-CM | POA: Diagnosis not present

## 2022-10-12 DIAGNOSIS — E876 Hypokalemia: Secondary | ICD-10-CM | POA: Diagnosis not present

## 2022-10-12 DIAGNOSIS — R0689 Other abnormalities of breathing: Secondary | ICD-10-CM | POA: Diagnosis not present

## 2022-10-12 DIAGNOSIS — I130 Hypertensive heart and chronic kidney disease with heart failure and stage 1 through stage 4 chronic kidney disease, or unspecified chronic kidney disease: Secondary | ICD-10-CM | POA: Diagnosis not present

## 2022-10-12 DIAGNOSIS — E1122 Type 2 diabetes mellitus with diabetic chronic kidney disease: Secondary | ICD-10-CM | POA: Diagnosis not present

## 2022-10-12 DIAGNOSIS — E1151 Type 2 diabetes mellitus with diabetic peripheral angiopathy without gangrene: Secondary | ICD-10-CM | POA: Diagnosis not present

## 2022-10-12 DIAGNOSIS — D509 Iron deficiency anemia, unspecified: Secondary | ICD-10-CM | POA: Diagnosis not present

## 2022-10-12 DIAGNOSIS — R0789 Other chest pain: Secondary | ICD-10-CM | POA: Diagnosis not present

## 2022-10-12 DIAGNOSIS — Z4502 Encounter for adjustment and management of automatic implantable cardiac defibrillator: Secondary | ICD-10-CM | POA: Diagnosis not present

## 2022-10-12 DIAGNOSIS — I10 Essential (primary) hypertension: Secondary | ICD-10-CM | POA: Diagnosis not present

## 2022-10-12 DIAGNOSIS — I5023 Acute on chronic systolic (congestive) heart failure: Secondary | ICD-10-CM | POA: Diagnosis not present

## 2022-10-12 DIAGNOSIS — R079 Chest pain, unspecified: Secondary | ICD-10-CM | POA: Diagnosis not present

## 2022-10-12 DIAGNOSIS — Z1152 Encounter for screening for COVID-19: Secondary | ICD-10-CM | POA: Diagnosis not present

## 2022-10-12 DIAGNOSIS — E039 Hypothyroidism, unspecified: Secondary | ICD-10-CM | POA: Diagnosis not present

## 2022-10-12 DIAGNOSIS — N183 Chronic kidney disease, stage 3 unspecified: Secondary | ICD-10-CM | POA: Diagnosis not present

## 2022-10-12 DIAGNOSIS — D72829 Elevated white blood cell count, unspecified: Secondary | ICD-10-CM | POA: Diagnosis not present

## 2022-10-12 DIAGNOSIS — J449 Chronic obstructive pulmonary disease, unspecified: Secondary | ICD-10-CM | POA: Diagnosis not present

## 2022-10-12 DIAGNOSIS — I255 Ischemic cardiomyopathy: Secondary | ICD-10-CM | POA: Diagnosis not present

## 2022-10-12 DIAGNOSIS — I701 Atherosclerosis of renal artery: Secondary | ICD-10-CM | POA: Diagnosis not present

## 2022-10-12 DIAGNOSIS — I252 Old myocardial infarction: Secondary | ICD-10-CM | POA: Diagnosis not present

## 2022-10-12 DIAGNOSIS — G8929 Other chronic pain: Secondary | ICD-10-CM | POA: Diagnosis not present

## 2022-10-12 DIAGNOSIS — I272 Pulmonary hypertension, unspecified: Secondary | ICD-10-CM | POA: Diagnosis not present

## 2022-10-12 DIAGNOSIS — I4901 Ventricular fibrillation: Secondary | ICD-10-CM | POA: Diagnosis not present

## 2022-10-12 DIAGNOSIS — Z953 Presence of xenogenic heart valve: Secondary | ICD-10-CM | POA: Diagnosis not present

## 2022-10-12 DIAGNOSIS — E785 Hyperlipidemia, unspecified: Secondary | ICD-10-CM | POA: Diagnosis not present

## 2022-10-12 DIAGNOSIS — R000 Tachycardia, unspecified: Secondary | ICD-10-CM | POA: Diagnosis not present

## 2022-10-12 DIAGNOSIS — I2510 Atherosclerotic heart disease of native coronary artery without angina pectoris: Secondary | ICD-10-CM | POA: Diagnosis not present

## 2022-10-12 DIAGNOSIS — J811 Chronic pulmonary edema: Secondary | ICD-10-CM | POA: Diagnosis not present

## 2022-10-12 DIAGNOSIS — I480 Paroxysmal atrial fibrillation: Secondary | ICD-10-CM | POA: Diagnosis not present

## 2022-10-12 DIAGNOSIS — J90 Pleural effusion, not elsewhere classified: Secondary | ICD-10-CM | POA: Diagnosis not present

## 2022-10-12 DIAGNOSIS — K219 Gastro-esophageal reflux disease without esophagitis: Secondary | ICD-10-CM | POA: Diagnosis not present

## 2022-10-12 DIAGNOSIS — Z95811 Presence of heart assist device: Secondary | ICD-10-CM | POA: Diagnosis not present

## 2022-10-17 NOTE — Progress Notes (Signed)
This encounter was created in error - please disregard.

## 2022-10-19 ENCOUNTER — Other Ambulatory Visit: Payer: Self-pay | Admitting: Internal Medicine

## 2022-10-19 MED ORDER — TRAZODONE HCL 50 MG PO TABS: ORAL_TABLET | ORAL | 3 refills | Status: DC

## 2022-10-19 NOTE — Telephone Encounter (Signed)
Lexapro last refilled in 04/2021 Colchicine is a historical medication   Trazodone last refilled: 10/13/2021  Last OV: 12/21/2021 Next OV: not scheduled

## 2022-10-19 NOTE — Telephone Encounter (Signed)
Prescription Request  10/19/2022  Is this a "Controlled Substance" medicine? No  LOV: 12/21/21  What is the name of the medication or equipment? colchicine 0.6 MG tablet         escitalopram (LEXAPRO) 10 MG tablet          traZODone (DESYREL) 50 MG tablet   Have you contacted your pharmacy to request a refill? Yes   Which pharmacy would you like this sent to?   [facility] [place], [address] [place] [address] Phone: [phone] Fax: [phone]    Patient notified that their request is being sent to the clinical staff for review and that they should receive a response within 2 business days.   Please advise at Mobile There is no such number on file (mobile).

## 2022-10-20 MED ORDER — COLCHICINE 0.6 MG PO TABS: 0.6000 mg | ORAL_TABLET | Freq: Two times a day (BID) | ORAL | 0 refills | Status: AC

## 2022-10-20 MED ORDER — ESCITALOPRAM OXALATE 10 MG PO TABS: 10.0000 mg | ORAL_TABLET | Freq: Every day | ORAL | 1 refills | Status: AC

## 2022-10-20 MED ORDER — TRAZODONE HCL 50 MG PO TABS: ORAL_TABLET | ORAL | 3 refills | Status: AC

## 2022-10-23 DIAGNOSIS — Z7901 Long term (current) use of anticoagulants: Secondary | ICD-10-CM | POA: Diagnosis not present

## 2022-10-23 DIAGNOSIS — Z952 Presence of prosthetic heart valve: Secondary | ICD-10-CM | POA: Diagnosis not present

## 2022-10-26 ENCOUNTER — Telehealth: Payer: Self-pay | Admitting: Internal Medicine

## 2022-10-26 NOTE — Telephone Encounter (Signed)
Copied from [place] [phone]. Topic: Medicare AWV >> Oct 26, 2022 11:03 AM Devoria Glassing wrote: Reason for CRM: Attempted to schedule AWV. Unable to LVM.  Will try at later time.

## 2022-11-27 DIAGNOSIS — Z8673 Personal history of transient ischemic attack (TIA), and cerebral infarction without residual deficits: Secondary | ICD-10-CM | POA: Diagnosis not present

## 2022-11-27 DIAGNOSIS — J449 Chronic obstructive pulmonary disease, unspecified: Secondary | ICD-10-CM | POA: Diagnosis not present

## 2022-11-27 DIAGNOSIS — K31811 Angiodysplasia of stomach and duodenum with bleeding: Secondary | ICD-10-CM | POA: Diagnosis not present

## 2022-11-27 DIAGNOSIS — J90 Pleural effusion, not elsewhere classified: Secondary | ICD-10-CM | POA: Diagnosis not present

## 2022-11-27 DIAGNOSIS — E039 Hypothyroidism, unspecified: Secondary | ICD-10-CM | POA: Diagnosis not present

## 2022-11-27 DIAGNOSIS — D509 Iron deficiency anemia, unspecified: Secondary | ICD-10-CM | POA: Diagnosis not present

## 2022-11-27 DIAGNOSIS — I480 Paroxysmal atrial fibrillation: Secondary | ICD-10-CM | POA: Diagnosis not present

## 2022-11-27 DIAGNOSIS — K5520 Angiodysplasia of colon without hemorrhage: Secondary | ICD-10-CM | POA: Diagnosis not present

## 2022-11-27 DIAGNOSIS — R918 Other nonspecific abnormal finding of lung field: Secondary | ICD-10-CM | POA: Diagnosis not present

## 2022-11-27 DIAGNOSIS — D649 Anemia, unspecified: Secondary | ICD-10-CM | POA: Diagnosis not present

## 2022-11-27 DIAGNOSIS — M79603 Pain in arm, unspecified: Secondary | ICD-10-CM | POA: Diagnosis not present

## 2022-11-27 DIAGNOSIS — R531 Weakness: Secondary | ICD-10-CM | POA: Diagnosis not present

## 2022-11-27 DIAGNOSIS — D62 Acute posthemorrhagic anemia: Secondary | ICD-10-CM | POA: Diagnosis not present

## 2022-11-27 DIAGNOSIS — K31819 Angiodysplasia of stomach and duodenum without bleeding: Secondary | ICD-10-CM | POA: Diagnosis not present

## 2022-11-27 DIAGNOSIS — I5023 Acute on chronic systolic (congestive) heart failure: Secondary | ICD-10-CM | POA: Diagnosis not present

## 2022-11-27 DIAGNOSIS — K5900 Constipation, unspecified: Secondary | ICD-10-CM | POA: Diagnosis not present

## 2022-11-27 DIAGNOSIS — T829XXD Unspecified complication of cardiac and vascular prosthetic device, implant and graft, subsequent encounter: Secondary | ICD-10-CM | POA: Diagnosis not present

## 2022-11-27 DIAGNOSIS — Z881 Allergy status to other antibiotic agents status: Secondary | ICD-10-CM | POA: Diagnosis not present

## 2022-11-27 DIAGNOSIS — Z95811 Presence of heart assist device: Secondary | ICD-10-CM | POA: Diagnosis not present

## 2022-11-27 DIAGNOSIS — R0689 Other abnormalities of breathing: Secondary | ICD-10-CM | POA: Diagnosis not present

## 2022-11-27 DIAGNOSIS — I517 Cardiomegaly: Secondary | ICD-10-CM | POA: Diagnosis not present

## 2022-11-27 DIAGNOSIS — I110 Hypertensive heart disease with heart failure: Secondary | ICD-10-CM | POA: Diagnosis not present

## 2022-11-27 DIAGNOSIS — K2289 Other specified disease of esophagus: Secondary | ICD-10-CM | POA: Diagnosis not present

## 2022-11-27 DIAGNOSIS — Z792 Long term (current) use of antibiotics: Secondary | ICD-10-CM | POA: Diagnosis not present

## 2022-11-27 DIAGNOSIS — I272 Pulmonary hypertension, unspecified: Secondary | ICD-10-CM | POA: Diagnosis not present

## 2022-11-27 DIAGNOSIS — Z91138 Patient's unintentional underdosing of medication regimen for other reason: Secondary | ICD-10-CM | POA: Diagnosis not present

## 2022-11-27 DIAGNOSIS — I2510 Atherosclerotic heart disease of native coronary artery without angina pectoris: Secondary | ICD-10-CM | POA: Diagnosis not present

## 2022-11-27 DIAGNOSIS — Z888 Allergy status to other drugs, medicaments and biological substances status: Secondary | ICD-10-CM | POA: Diagnosis not present

## 2022-11-27 DIAGNOSIS — K922 Gastrointestinal hemorrhage, unspecified: Secondary | ICD-10-CM | POA: Diagnosis not present

## 2022-11-27 DIAGNOSIS — T381X6A Underdosing of thyroid hormones and substitutes, initial encounter: Secondary | ICD-10-CM | POA: Diagnosis not present

## 2022-11-27 DIAGNOSIS — K921 Melena: Secondary | ICD-10-CM | POA: Diagnosis not present

## 2022-11-27 DIAGNOSIS — Z953 Presence of xenogenic heart valve: Secondary | ICD-10-CM | POA: Diagnosis not present

## 2022-11-27 DIAGNOSIS — R739 Hyperglycemia, unspecified: Secondary | ICD-10-CM | POA: Diagnosis not present

## 2022-11-27 DIAGNOSIS — Z7951 Long term (current) use of inhaled steroids: Secondary | ICD-10-CM | POA: Diagnosis not present

## 2022-11-27 DIAGNOSIS — I5189 Other ill-defined heart diseases: Secondary | ICD-10-CM | POA: Diagnosis not present

## 2022-11-27 DIAGNOSIS — Z79899 Other long term (current) drug therapy: Secondary | ICD-10-CM | POA: Diagnosis not present

## 2022-11-27 DIAGNOSIS — Z885 Allergy status to narcotic agent status: Secondary | ICD-10-CM | POA: Diagnosis not present

## 2022-11-27 DIAGNOSIS — R112 Nausea with vomiting, unspecified: Secondary | ICD-10-CM | POA: Diagnosis not present

## 2022-11-27 DIAGNOSIS — R5381 Other malaise: Secondary | ICD-10-CM | POA: Diagnosis not present

## 2022-11-27 DIAGNOSIS — Z7901 Long term (current) use of anticoagulants: Secondary | ICD-10-CM | POA: Diagnosis not present

## 2022-11-27 DIAGNOSIS — R0789 Other chest pain: Secondary | ICD-10-CM | POA: Diagnosis not present

## 2022-11-27 DIAGNOSIS — R079 Chest pain, unspecified: Secondary | ICD-10-CM | POA: Diagnosis not present

## 2022-11-27 DIAGNOSIS — E119 Type 2 diabetes mellitus without complications: Secondary | ICD-10-CM | POA: Diagnosis not present

## 2022-11-27 DIAGNOSIS — E785 Hyperlipidemia, unspecified: Secondary | ICD-10-CM | POA: Diagnosis not present

## 2022-11-27 DIAGNOSIS — Z1152 Encounter for screening for COVID-19: Secondary | ICD-10-CM | POA: Diagnosis not present

## 2022-11-29 DIAGNOSIS — I5189 Other ill-defined heart diseases: Secondary | ICD-10-CM | POA: Diagnosis not present

## 2022-12-06 DIAGNOSIS — Z7901 Long term (current) use of anticoagulants: Secondary | ICD-10-CM | POA: Diagnosis not present

## 2022-12-06 DIAGNOSIS — Z952 Presence of prosthetic heart valve: Secondary | ICD-10-CM | POA: Diagnosis not present

## 2022-12-22 DIAGNOSIS — Z48812 Encounter for surgical aftercare following surgery on the circulatory system: Secondary | ICD-10-CM | POA: Diagnosis not present

## 2022-12-22 DIAGNOSIS — Z95811 Presence of heart assist device: Secondary | ICD-10-CM | POA: Diagnosis not present

## 2022-12-22 DIAGNOSIS — Z4801 Encounter for change or removal of surgical wound dressing: Secondary | ICD-10-CM | POA: Diagnosis not present

## 2022-12-22 DIAGNOSIS — I428 Other cardiomyopathies: Secondary | ICD-10-CM | POA: Diagnosis not present

## 2022-12-29 DIAGNOSIS — R918 Other nonspecific abnormal finding of lung field: Secondary | ICD-10-CM | POA: Diagnosis not present

## 2022-12-29 DIAGNOSIS — I5023 Acute on chronic systolic (congestive) heart failure: Secondary | ICD-10-CM | POA: Diagnosis not present

## 2022-12-29 DIAGNOSIS — M109 Gout, unspecified: Secondary | ICD-10-CM | POA: Diagnosis not present

## 2022-12-29 DIAGNOSIS — I252 Old myocardial infarction: Secondary | ICD-10-CM | POA: Diagnosis not present

## 2022-12-29 DIAGNOSIS — D509 Iron deficiency anemia, unspecified: Secondary | ICD-10-CM | POA: Diagnosis not present

## 2022-12-29 DIAGNOSIS — E1140 Type 2 diabetes mellitus with diabetic neuropathy, unspecified: Secondary | ICD-10-CM | POA: Diagnosis not present

## 2022-12-29 DIAGNOSIS — J9601 Acute respiratory failure with hypoxia: Secondary | ICD-10-CM | POA: Diagnosis not present

## 2022-12-29 DIAGNOSIS — Z95811 Presence of heart assist device: Secondary | ICD-10-CM | POA: Diagnosis not present

## 2022-12-29 DIAGNOSIS — I10 Essential (primary) hypertension: Secondary | ICD-10-CM | POA: Diagnosis not present

## 2022-12-29 DIAGNOSIS — I255 Ischemic cardiomyopathy: Secondary | ICD-10-CM | POA: Diagnosis not present

## 2022-12-29 DIAGNOSIS — K219 Gastro-esophageal reflux disease without esophagitis: Secondary | ICD-10-CM | POA: Diagnosis not present

## 2022-12-29 DIAGNOSIS — R0689 Other abnormalities of breathing: Secondary | ICD-10-CM | POA: Diagnosis not present

## 2022-12-29 DIAGNOSIS — I130 Hypertensive heart and chronic kidney disease with heart failure and stage 1 through stage 4 chronic kidney disease, or unspecified chronic kidney disease: Secondary | ICD-10-CM | POA: Diagnosis not present

## 2022-12-29 DIAGNOSIS — I160 Hypertensive urgency: Secondary | ICD-10-CM | POA: Diagnosis not present

## 2022-12-29 DIAGNOSIS — R931 Abnormal findings on diagnostic imaging of heart and coronary circulation: Secondary | ICD-10-CM | POA: Diagnosis not present

## 2022-12-29 DIAGNOSIS — Z1152 Encounter for screening for COVID-19: Secondary | ICD-10-CM | POA: Diagnosis not present

## 2022-12-29 DIAGNOSIS — I701 Atherosclerosis of renal artery: Secondary | ICD-10-CM | POA: Diagnosis not present

## 2022-12-29 DIAGNOSIS — I420 Dilated cardiomyopathy: Secondary | ICD-10-CM | POA: Diagnosis not present

## 2022-12-29 DIAGNOSIS — E1151 Type 2 diabetes mellitus with diabetic peripheral angiopathy without gangrene: Secondary | ICD-10-CM | POA: Diagnosis not present

## 2022-12-29 DIAGNOSIS — I501 Left ventricular failure: Secondary | ICD-10-CM | POA: Diagnosis not present

## 2022-12-29 DIAGNOSIS — I480 Paroxysmal atrial fibrillation: Secondary | ICD-10-CM | POA: Diagnosis not present

## 2022-12-29 DIAGNOSIS — Z7901 Long term (current) use of anticoagulants: Secondary | ICD-10-CM | POA: Diagnosis not present

## 2022-12-29 DIAGNOSIS — E039 Hypothyroidism, unspecified: Secondary | ICD-10-CM | POA: Diagnosis not present

## 2022-12-29 DIAGNOSIS — E876 Hypokalemia: Secondary | ICD-10-CM | POA: Diagnosis not present

## 2022-12-29 DIAGNOSIS — E785 Hyperlipidemia, unspecified: Secondary | ICD-10-CM | POA: Diagnosis not present

## 2022-12-29 DIAGNOSIS — R059 Cough, unspecified: Secondary | ICD-10-CM | POA: Diagnosis not present

## 2022-12-29 DIAGNOSIS — I5022 Chronic systolic (congestive) heart failure: Secondary | ICD-10-CM | POA: Diagnosis not present

## 2022-12-29 DIAGNOSIS — I272 Pulmonary hypertension, unspecified: Secondary | ICD-10-CM | POA: Diagnosis not present

## 2022-12-29 DIAGNOSIS — R0600 Dyspnea, unspecified: Secondary | ICD-10-CM | POA: Diagnosis not present

## 2022-12-29 DIAGNOSIS — Z953 Presence of xenogenic heart valve: Secondary | ICD-10-CM | POA: Diagnosis not present

## 2022-12-29 DIAGNOSIS — J449 Chronic obstructive pulmonary disease, unspecified: Secondary | ICD-10-CM | POA: Diagnosis not present

## 2022-12-29 DIAGNOSIS — I959 Hypotension, unspecified: Secondary | ICD-10-CM | POA: Diagnosis not present

## 2022-12-29 DIAGNOSIS — E1122 Type 2 diabetes mellitus with diabetic chronic kidney disease: Secondary | ICD-10-CM | POA: Diagnosis not present

## 2022-12-29 DIAGNOSIS — I2510 Atherosclerotic heart disease of native coronary artery without angina pectoris: Secondary | ICD-10-CM | POA: Diagnosis not present

## 2022-12-29 DIAGNOSIS — R069 Unspecified abnormalities of breathing: Secondary | ICD-10-CM | POA: Diagnosis not present

## 2022-12-29 DIAGNOSIS — N183 Chronic kidney disease, stage 3 unspecified: Secondary | ICD-10-CM | POA: Diagnosis not present

## 2022-12-29 DIAGNOSIS — Z951 Presence of aortocoronary bypass graft: Secondary | ICD-10-CM | POA: Diagnosis not present

## 2023-02-21 ENCOUNTER — Telehealth: Payer: Self-pay

## 2023-02-21 MED ORDER — AMITRIPTYLINE HCL 25 MG PO TABS: 25.0000 mg | ORAL_TABLET | Freq: Every day | ORAL | 0 refills | Status: AC

## 2023-02-21 NOTE — Telephone Encounter (Signed)
Medication refilled for 30 due to being overdue for an appt. Number we have in chart is not a working number.

## 2023-02-26 DIAGNOSIS — I158 Other secondary hypertension: Secondary | ICD-10-CM | POA: Diagnosis not present

## 2023-02-26 DIAGNOSIS — I739 Peripheral vascular disease, unspecified: Secondary | ICD-10-CM | POA: Diagnosis not present

## 2023-02-26 DIAGNOSIS — I5022 Chronic systolic (congestive) heart failure: Secondary | ICD-10-CM | POA: Diagnosis not present

## 2023-02-26 DIAGNOSIS — I214 Non-ST elevation (NSTEMI) myocardial infarction: Secondary | ICD-10-CM | POA: Diagnosis not present

## 2023-02-26 DIAGNOSIS — Z7901 Long term (current) use of anticoagulants: Secondary | ICD-10-CM | POA: Diagnosis not present

## 2023-02-26 DIAGNOSIS — I2510 Atherosclerotic heart disease of native coronary artery without angina pectoris: Secondary | ICD-10-CM | POA: Diagnosis not present

## 2023-02-26 DIAGNOSIS — I110 Hypertensive heart disease with heart failure: Secondary | ICD-10-CM | POA: Diagnosis not present

## 2023-02-26 DIAGNOSIS — I480 Paroxysmal atrial fibrillation: Secondary | ICD-10-CM | POA: Diagnosis not present

## 2023-02-26 DIAGNOSIS — I255 Ischemic cardiomyopathy: Secondary | ICD-10-CM | POA: Diagnosis not present

## 2023-02-26 DIAGNOSIS — E785 Hyperlipidemia, unspecified: Secondary | ICD-10-CM | POA: Diagnosis not present

## 2023-02-26 DIAGNOSIS — K219 Gastro-esophageal reflux disease without esophagitis: Secondary | ICD-10-CM | POA: Diagnosis not present

## 2023-02-26 DIAGNOSIS — I499 Cardiac arrhythmia, unspecified: Secondary | ICD-10-CM | POA: Diagnosis not present

## 2023-02-28 DIAGNOSIS — I428 Other cardiomyopathies: Secondary | ICD-10-CM | POA: Diagnosis not present

## 2023-02-28 DIAGNOSIS — Z4801 Encounter for change or removal of surgical wound dressing: Secondary | ICD-10-CM | POA: Diagnosis not present

## 2023-02-28 DIAGNOSIS — Z48812 Encounter for surgical aftercare following surgery on the circulatory system: Secondary | ICD-10-CM | POA: Diagnosis not present

## 2023-02-28 DIAGNOSIS — Z95811 Presence of heart assist device: Secondary | ICD-10-CM | POA: Diagnosis not present

## 2023-03-13 ENCOUNTER — Ambulatory Visit
Admission: RE | Admit: 2023-03-13 | Discharge: 2023-03-13 | Disposition: A | Discharge status: Home / Self Care | Payer: Medicare HMO | Source: Ambulatory Visit | Attending: Registered Nurse | Admitting: Registered Nurse

## 2023-03-13 DIAGNOSIS — D62 Acute posthemorrhagic anemia: Secondary | ICD-10-CM | POA: Insufficient documentation

## 2023-03-13 DIAGNOSIS — J441 Chronic obstructive pulmonary disease with (acute) exacerbation: Secondary | ICD-10-CM | POA: Diagnosis not present

## 2023-03-13 DIAGNOSIS — I5032 Chronic diastolic (congestive) heart failure: Secondary | ICD-10-CM | POA: Diagnosis not present

## 2023-03-13 MED ORDER — SODIUM CHLORIDE 0.9 % IV SOLN
400.0000 mg | INTRAVENOUS | Status: DC
  Administered 2023-03-13: 400 mg via INTRAVENOUS
  Filled 2023-03-13: qty 20

## 2023-03-15 ENCOUNTER — Ambulatory Visit: Payer: Self-pay

## 2023-03-15 NOTE — Patient Outreach (Signed)
  Care Coordination   Initial Visit Note   03/15/2023 Name: Ashley [name] MRN: [id number] DOB: Sep 30, 1951  Ashley [name] is a 72 y.o. year old female who sees Darrick Huntsman, Mar Daring, MD for primary care. I spoke with  Ashley [name] by phone today.  What matters to the patients health and wellness today?  CM educated patient on [facility] services.  Patient declines services and feels that she is able to manage her medical needs.  Patient agreed to contact her provider in the future if he needs [facility] services.    Goals Addressed   None     SDOH assessments and interventions completed:  No     Care Coordination Interventions:  No, not indicated   Follow up plan: No further intervention required.   Encounter Outcome:  Pt. Refused

## 2023-03-19 ENCOUNTER — Other Ambulatory Visit: Payer: Self-pay

## 2023-03-19 MED ORDER — ALBUTEROL SULFATE HFA 108 (90 BASE) MCG/ACT IN AERS: 1.0000 | INHALATION_SPRAY | Freq: Four times a day (QID) | RESPIRATORY_TRACT | 2 refills | Status: AC | PRN

## 2023-03-20 ENCOUNTER — Ambulatory Visit
Admission: RE | Admit: 2023-03-20 | Discharge: 2023-03-20 | Disposition: A | Discharge status: Home / Self Care | Payer: Medicare HMO | Source: Ambulatory Visit | Attending: Registered Nurse | Admitting: Registered Nurse

## 2023-03-20 DIAGNOSIS — D509 Iron deficiency anemia, unspecified: Secondary | ICD-10-CM | POA: Diagnosis not present

## 2023-03-20 MED ORDER — SODIUM CHLORIDE 0.9 % IV SOLN
400.0000 mg | Freq: Once | INTRAVENOUS | Status: AC
  Administered 2023-03-20: 400 mg via INTRAVENOUS
  Filled 2023-03-20: qty 20

## 2023-04-04 DIAGNOSIS — Z95811 Presence of heart assist device: Secondary | ICD-10-CM | POA: Diagnosis not present

## 2023-04-04 DIAGNOSIS — R918 Other nonspecific abnormal finding of lung field: Secondary | ICD-10-CM | POA: Diagnosis not present

## 2023-04-04 DIAGNOSIS — R9082 White matter disease, unspecified: Secondary | ICD-10-CM | POA: Diagnosis not present

## 2023-04-04 DIAGNOSIS — J690 Pneumonitis due to inhalation of food and vomit: Secondary | ICD-10-CM | POA: Diagnosis not present

## 2023-04-04 DIAGNOSIS — J9809 Other diseases of bronchus, not elsewhere classified: Secondary | ICD-10-CM | POA: Diagnosis not present

## 2023-04-04 DIAGNOSIS — Z951 Presence of aortocoronary bypass graft: Secondary | ICD-10-CM | POA: Diagnosis not present

## 2023-04-04 DIAGNOSIS — J811 Chronic pulmonary edema: Secondary | ICD-10-CM | POA: Diagnosis not present

## 2023-04-04 DIAGNOSIS — Z743 Need for continuous supervision: Secondary | ICD-10-CM | POA: Diagnosis not present

## 2023-04-04 DIAGNOSIS — E119 Type 2 diabetes mellitus without complications: Secondary | ICD-10-CM | POA: Diagnosis not present

## 2023-04-04 DIAGNOSIS — D62 Acute posthemorrhagic anemia: Secondary | ICD-10-CM | POA: Diagnosis not present

## 2023-04-04 DIAGNOSIS — J9811 Atelectasis: Secondary | ICD-10-CM | POA: Diagnosis not present

## 2023-04-04 DIAGNOSIS — R258 Other abnormal involuntary movements: Secondary | ICD-10-CM | POA: Diagnosis not present

## 2023-04-04 DIAGNOSIS — Y33XXXA Other specified events, undetermined intent, initial encounter: Secondary | ICD-10-CM | POA: Diagnosis not present

## 2023-04-04 DIAGNOSIS — I639 Cerebral infarction, unspecified: Secondary | ICD-10-CM | POA: Diagnosis not present

## 2023-04-04 DIAGNOSIS — N183 Chronic kidney disease, stage 3 unspecified: Secondary | ICD-10-CM | POA: Diagnosis not present

## 2023-04-04 DIAGNOSIS — E1122 Type 2 diabetes mellitus with diabetic chronic kidney disease: Secondary | ICD-10-CM | POA: Diagnosis not present

## 2023-04-04 DIAGNOSIS — Z66 Do not resuscitate: Secondary | ICD-10-CM | POA: Diagnosis not present

## 2023-04-04 DIAGNOSIS — Z515 Encounter for palliative care: Secondary | ICD-10-CM | POA: Diagnosis not present

## 2023-04-04 DIAGNOSIS — I499 Cardiac arrhythmia, unspecified: Secondary | ICD-10-CM | POA: Diagnosis not present

## 2023-04-04 DIAGNOSIS — I724 Aneurysm of artery of lower extremity: Secondary | ICD-10-CM | POA: Diagnosis not present

## 2023-04-04 DIAGNOSIS — N179 Acute kidney failure, unspecified: Secondary | ICD-10-CM | POA: Diagnosis not present

## 2023-04-04 DIAGNOSIS — R0689 Other abnormalities of breathing: Secondary | ICD-10-CM | POA: Diagnosis not present

## 2023-04-04 DIAGNOSIS — I97638 Postprocedural hematoma of a circulatory system organ or structure following other circulatory system procedure: Secondary | ICD-10-CM | POA: Diagnosis not present

## 2023-04-04 DIAGNOSIS — R569 Unspecified convulsions: Secondary | ICD-10-CM | POA: Diagnosis not present

## 2023-04-04 DIAGNOSIS — I469 Cardiac arrest, cause unspecified: Secondary | ICD-10-CM | POA: Diagnosis not present

## 2023-04-04 DIAGNOSIS — Z8679 Personal history of other diseases of the circulatory system: Secondary | ICD-10-CM | POA: Diagnosis not present

## 2023-04-04 DIAGNOSIS — S32402A Unspecified fracture of left acetabulum, initial encounter for closed fracture: Secondary | ICD-10-CM | POA: Diagnosis not present

## 2023-04-04 DIAGNOSIS — J9690 Respiratory failure, unspecified, unspecified whether with hypoxia or hypercapnia: Secondary | ICD-10-CM | POA: Diagnosis not present

## 2023-04-04 DIAGNOSIS — Z952 Presence of prosthetic heart valve: Secondary | ICD-10-CM | POA: Diagnosis not present

## 2023-04-04 DIAGNOSIS — I129 Hypertensive chronic kidney disease with stage 1 through stage 4 chronic kidney disease, or unspecified chronic kidney disease: Secondary | ICD-10-CM | POA: Diagnosis not present

## 2023-04-04 DIAGNOSIS — Z8619 Personal history of other infectious and parasitic diseases: Secondary | ICD-10-CM | POA: Diagnosis not present

## 2023-04-04 DIAGNOSIS — J9601 Acute respiratory failure with hypoxia: Secondary | ICD-10-CM | POA: Diagnosis not present

## 2023-04-04 DIAGNOSIS — I6389 Other cerebral infarction: Secondary | ICD-10-CM | POA: Diagnosis not present

## 2023-04-04 DIAGNOSIS — Z5982 Transportation insecurity: Secondary | ICD-10-CM | POA: Diagnosis not present

## 2023-04-04 DIAGNOSIS — I10 Essential (primary) hypertension: Secondary | ICD-10-CM | POA: Diagnosis not present

## 2023-04-04 DIAGNOSIS — Z4682 Encounter for fitting and adjustment of non-vascular catheter: Secondary | ICD-10-CM | POA: Diagnosis not present

## 2023-04-04 DIAGNOSIS — G9382 Brain death: Secondary | ICD-10-CM | POA: Diagnosis not present

## 2023-04-04 DIAGNOSIS — J9621 Acute and chronic respiratory failure with hypoxia: Secondary | ICD-10-CM | POA: Diagnosis not present

## 2023-04-04 DIAGNOSIS — I3139 Other pericardial effusion (noninflammatory): Secondary | ICD-10-CM | POA: Diagnosis not present

## 2023-04-04 DIAGNOSIS — Z4502 Encounter for adjustment and management of automatic implantable cardiac defibrillator: Secondary | ICD-10-CM | POA: Diagnosis not present

## 2023-04-04 DIAGNOSIS — G931 Anoxic brain damage, not elsewhere classified: Secondary | ICD-10-CM | POA: Diagnosis not present

## 2023-04-04 DIAGNOSIS — M8588 Other specified disorders of bone density and structure, other site: Secondary | ICD-10-CM | POA: Diagnosis not present

## 2023-04-04 DIAGNOSIS — E1165 Type 2 diabetes mellitus with hyperglycemia: Secondary | ICD-10-CM | POA: Diagnosis not present

## 2023-04-04 DIAGNOSIS — X58XXXA Exposure to other specified factors, initial encounter: Secondary | ICD-10-CM | POA: Diagnosis not present

## 2023-04-04 DIAGNOSIS — S35512A Injury of left iliac artery, initial encounter: Secondary | ICD-10-CM | POA: Diagnosis not present

## 2023-04-04 DIAGNOSIS — M8448XA Pathological fracture, other site, initial encounter for fracture: Secondary | ICD-10-CM | POA: Diagnosis not present

## 2023-04-04 DIAGNOSIS — Z452 Encounter for adjustment and management of vascular access device: Secondary | ICD-10-CM | POA: Diagnosis not present

## 2023-04-04 DIAGNOSIS — S32492A Other specified fracture of left acetabulum, initial encounter for closed fracture: Secondary | ICD-10-CM | POA: Diagnosis not present

## 2023-04-05 DIAGNOSIS — E119 Type 2 diabetes mellitus without complications: Secondary | ICD-10-CM | POA: Diagnosis not present

## 2023-04-05 DIAGNOSIS — R918 Other nonspecific abnormal finding of lung field: Secondary | ICD-10-CM | POA: Diagnosis not present

## 2023-04-05 DIAGNOSIS — N183 Chronic kidney disease, stage 3 unspecified: Secondary | ICD-10-CM | POA: Diagnosis not present

## 2023-04-05 DIAGNOSIS — Z95811 Presence of heart assist device: Secondary | ICD-10-CM | POA: Diagnosis not present

## 2023-04-05 DIAGNOSIS — I3139 Other pericardial effusion (noninflammatory): Secondary | ICD-10-CM | POA: Diagnosis not present

## 2023-04-05 DIAGNOSIS — I469 Cardiac arrest, cause unspecified: Secondary | ICD-10-CM | POA: Diagnosis not present

## 2023-04-05 DIAGNOSIS — I129 Hypertensive chronic kidney disease with stage 1 through stage 4 chronic kidney disease, or unspecified chronic kidney disease: Secondary | ICD-10-CM | POA: Diagnosis not present

## 2023-04-05 DIAGNOSIS — R569 Unspecified convulsions: Secondary | ICD-10-CM | POA: Diagnosis not present

## 2023-04-05 DIAGNOSIS — Z66 Do not resuscitate: Secondary | ICD-10-CM | POA: Diagnosis not present

## 2023-04-05 DIAGNOSIS — Z8679 Personal history of other diseases of the circulatory system: Secondary | ICD-10-CM | POA: Diagnosis not present

## 2023-04-05 DIAGNOSIS — E1122 Type 2 diabetes mellitus with diabetic chronic kidney disease: Secondary | ICD-10-CM | POA: Diagnosis not present

## 2023-04-05 DIAGNOSIS — I10 Essential (primary) hypertension: Secondary | ICD-10-CM | POA: Diagnosis not present

## 2023-04-06 DIAGNOSIS — I724 Aneurysm of artery of lower extremity: Secondary | ICD-10-CM | POA: Diagnosis not present

## 2023-04-06 DIAGNOSIS — I129 Hypertensive chronic kidney disease with stage 1 through stage 4 chronic kidney disease, or unspecified chronic kidney disease: Secondary | ICD-10-CM | POA: Diagnosis not present

## 2023-04-06 DIAGNOSIS — J9811 Atelectasis: Secondary | ICD-10-CM | POA: Diagnosis not present

## 2023-04-06 DIAGNOSIS — Z4682 Encounter for fitting and adjustment of non-vascular catheter: Secondary | ICD-10-CM | POA: Diagnosis not present

## 2023-04-06 DIAGNOSIS — Z66 Do not resuscitate: Secondary | ICD-10-CM | POA: Diagnosis not present

## 2023-04-06 DIAGNOSIS — I3139 Other pericardial effusion (noninflammatory): Secondary | ICD-10-CM | POA: Diagnosis not present

## 2023-04-06 DIAGNOSIS — R569 Unspecified convulsions: Secondary | ICD-10-CM | POA: Diagnosis not present

## 2023-04-06 DIAGNOSIS — N183 Chronic kidney disease, stage 3 unspecified: Secondary | ICD-10-CM | POA: Diagnosis not present

## 2023-04-06 DIAGNOSIS — E119 Type 2 diabetes mellitus without complications: Secondary | ICD-10-CM | POA: Diagnosis not present

## 2023-04-06 DIAGNOSIS — I10 Essential (primary) hypertension: Secondary | ICD-10-CM | POA: Diagnosis not present

## 2023-04-06 DIAGNOSIS — I469 Cardiac arrest, cause unspecified: Secondary | ICD-10-CM | POA: Diagnosis not present

## 2023-04-06 DIAGNOSIS — E1122 Type 2 diabetes mellitus with diabetic chronic kidney disease: Secondary | ICD-10-CM | POA: Diagnosis not present

## 2023-04-06 DIAGNOSIS — Z95811 Presence of heart assist device: Secondary | ICD-10-CM | POA: Diagnosis not present

## 2023-04-07 DIAGNOSIS — Z95811 Presence of heart assist device: Secondary | ICD-10-CM | POA: Diagnosis not present

## 2023-04-07 DIAGNOSIS — I3139 Other pericardial effusion (noninflammatory): Secondary | ICD-10-CM | POA: Diagnosis not present

## 2023-04-07 DIAGNOSIS — E1122 Type 2 diabetes mellitus with diabetic chronic kidney disease: Secondary | ICD-10-CM | POA: Diagnosis not present

## 2023-04-07 DIAGNOSIS — J9809 Other diseases of bronchus, not elsewhere classified: Secondary | ICD-10-CM | POA: Diagnosis not present

## 2023-04-07 DIAGNOSIS — Z66 Do not resuscitate: Secondary | ICD-10-CM | POA: Diagnosis not present

## 2023-04-07 DIAGNOSIS — S32492A Other specified fracture of left acetabulum, initial encounter for closed fracture: Secondary | ICD-10-CM | POA: Diagnosis not present

## 2023-04-07 DIAGNOSIS — S35512A Injury of left iliac artery, initial encounter: Secondary | ICD-10-CM | POA: Diagnosis not present

## 2023-04-07 DIAGNOSIS — I129 Hypertensive chronic kidney disease with stage 1 through stage 4 chronic kidney disease, or unspecified chronic kidney disease: Secondary | ICD-10-CM | POA: Diagnosis not present

## 2023-04-07 DIAGNOSIS — J811 Chronic pulmonary edema: Secondary | ICD-10-CM | POA: Diagnosis not present

## 2023-04-07 DIAGNOSIS — Z452 Encounter for adjustment and management of vascular access device: Secondary | ICD-10-CM | POA: Diagnosis not present

## 2023-04-07 DIAGNOSIS — N183 Chronic kidney disease, stage 3 unspecified: Secondary | ICD-10-CM | POA: Diagnosis not present

## 2023-04-07 DIAGNOSIS — X58XXXA Exposure to other specified factors, initial encounter: Secondary | ICD-10-CM | POA: Diagnosis not present

## 2023-04-07 DIAGNOSIS — I469 Cardiac arrest, cause unspecified: Secondary | ICD-10-CM | POA: Diagnosis not present

## 2023-04-07 DIAGNOSIS — Z4682 Encounter for fitting and adjustment of non-vascular catheter: Secondary | ICD-10-CM | POA: Diagnosis not present

## 2023-04-08 DIAGNOSIS — I3139 Other pericardial effusion (noninflammatory): Secondary | ICD-10-CM | POA: Diagnosis not present

## 2023-04-08 DIAGNOSIS — E119 Type 2 diabetes mellitus without complications: Secondary | ICD-10-CM | POA: Diagnosis not present

## 2023-04-08 DIAGNOSIS — X58XXXA Exposure to other specified factors, initial encounter: Secondary | ICD-10-CM | POA: Diagnosis not present

## 2023-04-08 DIAGNOSIS — Z95811 Presence of heart assist device: Secondary | ICD-10-CM | POA: Diagnosis not present

## 2023-04-08 DIAGNOSIS — E1122 Type 2 diabetes mellitus with diabetic chronic kidney disease: Secondary | ICD-10-CM | POA: Diagnosis not present

## 2023-04-08 DIAGNOSIS — J811 Chronic pulmonary edema: Secondary | ICD-10-CM | POA: Diagnosis not present

## 2023-04-08 DIAGNOSIS — I129 Hypertensive chronic kidney disease with stage 1 through stage 4 chronic kidney disease, or unspecified chronic kidney disease: Secondary | ICD-10-CM | POA: Diagnosis not present

## 2023-04-08 DIAGNOSIS — I724 Aneurysm of artery of lower extremity: Secondary | ICD-10-CM | POA: Diagnosis not present

## 2023-04-08 DIAGNOSIS — Z4682 Encounter for fitting and adjustment of non-vascular catheter: Secondary | ICD-10-CM | POA: Diagnosis not present

## 2023-04-08 DIAGNOSIS — Z66 Do not resuscitate: Secondary | ICD-10-CM | POA: Diagnosis not present

## 2023-04-08 DIAGNOSIS — S32492A Other specified fracture of left acetabulum, initial encounter for closed fracture: Secondary | ICD-10-CM | POA: Diagnosis not present

## 2023-04-08 DIAGNOSIS — R918 Other nonspecific abnormal finding of lung field: Secondary | ICD-10-CM | POA: Diagnosis not present

## 2023-04-08 DIAGNOSIS — R9082 White matter disease, unspecified: Secondary | ICD-10-CM | POA: Diagnosis not present

## 2023-04-08 DIAGNOSIS — N183 Chronic kidney disease, stage 3 unspecified: Secondary | ICD-10-CM | POA: Diagnosis not present

## 2023-04-08 DIAGNOSIS — Z951 Presence of aortocoronary bypass graft: Secondary | ICD-10-CM | POA: Diagnosis not present

## 2023-04-08 DIAGNOSIS — I469 Cardiac arrest, cause unspecified: Secondary | ICD-10-CM | POA: Diagnosis not present

## 2023-04-08 DIAGNOSIS — Z952 Presence of prosthetic heart valve: Secondary | ICD-10-CM | POA: Diagnosis not present

## 2023-04-09 DIAGNOSIS — J9601 Acute respiratory failure with hypoxia: Secondary | ICD-10-CM | POA: Diagnosis not present

## 2023-04-09 DIAGNOSIS — Z8619 Personal history of other infectious and parasitic diseases: Secondary | ICD-10-CM | POA: Diagnosis not present

## 2023-04-09 DIAGNOSIS — D62 Acute posthemorrhagic anemia: Secondary | ICD-10-CM | POA: Diagnosis not present

## 2023-04-09 DIAGNOSIS — Z4682 Encounter for fitting and adjustment of non-vascular catheter: Secondary | ICD-10-CM | POA: Diagnosis not present

## 2023-04-09 DIAGNOSIS — N179 Acute kidney failure, unspecified: Secondary | ICD-10-CM | POA: Diagnosis not present

## 2023-04-09 DIAGNOSIS — Z951 Presence of aortocoronary bypass graft: Secondary | ICD-10-CM | POA: Diagnosis not present

## 2023-04-09 DIAGNOSIS — E1122 Type 2 diabetes mellitus with diabetic chronic kidney disease: Secondary | ICD-10-CM | POA: Diagnosis not present

## 2023-04-09 DIAGNOSIS — I129 Hypertensive chronic kidney disease with stage 1 through stage 4 chronic kidney disease, or unspecified chronic kidney disease: Secondary | ICD-10-CM | POA: Diagnosis not present

## 2023-04-09 DIAGNOSIS — S32402A Unspecified fracture of left acetabulum, initial encounter for closed fracture: Secondary | ICD-10-CM | POA: Diagnosis not present

## 2023-04-09 DIAGNOSIS — G931 Anoxic brain damage, not elsewhere classified: Secondary | ICD-10-CM | POA: Diagnosis not present

## 2023-04-09 DIAGNOSIS — I469 Cardiac arrest, cause unspecified: Secondary | ICD-10-CM | POA: Diagnosis not present

## 2023-04-09 DIAGNOSIS — N183 Chronic kidney disease, stage 3 unspecified: Secondary | ICD-10-CM | POA: Diagnosis not present

## 2023-04-09 DIAGNOSIS — G9382 Brain death: Secondary | ICD-10-CM | POA: Diagnosis not present

## 2023-04-09 DIAGNOSIS — Z95811 Presence of heart assist device: Secondary | ICD-10-CM | POA: Diagnosis not present

## 2023-04-10 DIAGNOSIS — Z951 Presence of aortocoronary bypass graft: Secondary | ICD-10-CM | POA: Diagnosis not present

## 2023-04-10 DIAGNOSIS — R918 Other nonspecific abnormal finding of lung field: Secondary | ICD-10-CM | POA: Diagnosis not present

## 2023-04-10 DIAGNOSIS — I469 Cardiac arrest, cause unspecified: Secondary | ICD-10-CM | POA: Diagnosis not present

## 2023-04-10 DIAGNOSIS — Z952 Presence of prosthetic heart valve: Secondary | ICD-10-CM | POA: Diagnosis not present

## 2023-04-10 DIAGNOSIS — I129 Hypertensive chronic kidney disease with stage 1 through stage 4 chronic kidney disease, or unspecified chronic kidney disease: Secondary | ICD-10-CM | POA: Diagnosis not present

## 2023-04-10 DIAGNOSIS — N179 Acute kidney failure, unspecified: Secondary | ICD-10-CM | POA: Diagnosis not present

## 2023-04-10 DIAGNOSIS — G9382 Brain death: Secondary | ICD-10-CM | POA: Diagnosis not present

## 2023-04-10 DIAGNOSIS — E1122 Type 2 diabetes mellitus with diabetic chronic kidney disease: Secondary | ICD-10-CM | POA: Diagnosis not present

## 2023-04-10 DIAGNOSIS — N183 Chronic kidney disease, stage 3 unspecified: Secondary | ICD-10-CM | POA: Diagnosis not present

## 2023-04-10 DIAGNOSIS — Z4502 Encounter for adjustment and management of automatic implantable cardiac defibrillator: Secondary | ICD-10-CM | POA: Diagnosis not present

## 2023-04-10 DIAGNOSIS — J9690 Respiratory failure, unspecified, unspecified whether with hypoxia or hypercapnia: Secondary | ICD-10-CM | POA: Diagnosis not present

## 2023-04-10 DIAGNOSIS — G931 Anoxic brain damage, not elsewhere classified: Secondary | ICD-10-CM | POA: Diagnosis not present

## 2023-04-10 DIAGNOSIS — D62 Acute posthemorrhagic anemia: Secondary | ICD-10-CM | POA: Diagnosis not present

## 2023-04-10 DIAGNOSIS — Z95811 Presence of heart assist device: Secondary | ICD-10-CM | POA: Diagnosis not present

## 2023-04-10 DIAGNOSIS — E1165 Type 2 diabetes mellitus with hyperglycemia: Secondary | ICD-10-CM | POA: Diagnosis not present

## 2023-04-26 ENCOUNTER — Telehealth: Payer: Self-pay | Admitting: Licensed Clinical Social Worker

## 2023-04-26 NOTE — Telephone Encounter (Signed)
Received a phone call from APS legal guardian Diona Fanti, [phone], inquiring if patient is in the hospital.  Per Diona Fanti she received a phone call from patient's family member saying that patient is deceased.  CSW confirmed with her that per chart review patient is deceased.
